# Patient Record
Sex: Female | Born: 1935 | Race: White | Hispanic: No | Smoking: Former smoker
Health system: Southern US, Community
[De-identification: ages and names within clinical notes are randomized; demographics above are authoritative.]

## PROBLEM LIST (undated history)

## (undated) DIAGNOSIS — F329 Major depressive disorder, single episode, unspecified: Secondary | ICD-10-CM

## (undated) DIAGNOSIS — R0600 Dyspnea, unspecified: Secondary | ICD-10-CM

## (undated) DIAGNOSIS — K117 Disturbances of salivary secretion: Secondary | ICD-10-CM

## (undated) DIAGNOSIS — R269 Unspecified abnormalities of gait and mobility: Secondary | ICD-10-CM

## (undated) DIAGNOSIS — E785 Hyperlipidemia, unspecified: Secondary | ICD-10-CM

## (undated) DIAGNOSIS — M545 Low back pain: Secondary | ICD-10-CM

## (undated) DIAGNOSIS — H33001 Unspecified retinal detachment with retinal break, right eye: Secondary | ICD-10-CM

## (undated) DIAGNOSIS — R42 Dizziness and giddiness: Secondary | ICD-10-CM

## (undated) DIAGNOSIS — G250 Essential tremor: Secondary | ICD-10-CM

## (undated) DIAGNOSIS — G2581 Restless legs syndrome: Principal | ICD-10-CM

## (undated) DIAGNOSIS — R413 Other amnesia: Secondary | ICD-10-CM

## (undated) DIAGNOSIS — M810 Age-related osteoporosis without current pathological fracture: Secondary | ICD-10-CM

## (undated) DIAGNOSIS — K219 Gastro-esophageal reflux disease without esophagitis: Secondary | ICD-10-CM

## (undated) DIAGNOSIS — C44721 Squamous cell carcinoma of skin of unspecified lower limb, including hip: Secondary | ICD-10-CM

## (undated) DIAGNOSIS — I2510 Atherosclerotic heart disease of native coronary artery without angina pectoris: Secondary | ICD-10-CM

## (undated) DIAGNOSIS — M1990 Unspecified osteoarthritis, unspecified site: Secondary | ICD-10-CM

## (undated) DIAGNOSIS — Z87442 Personal history of urinary calculi: Secondary | ICD-10-CM

## (undated) DIAGNOSIS — J209 Acute bronchitis, unspecified: Secondary | ICD-10-CM

## (undated) DIAGNOSIS — J438 Other emphysema: Secondary | ICD-10-CM

## (undated) DIAGNOSIS — M2560 Stiffness of unspecified joint, not elsewhere classified: Secondary | ICD-10-CM

## (undated) DIAGNOSIS — I059 Rheumatic mitral valve disease, unspecified: Secondary | ICD-10-CM

## (undated) DIAGNOSIS — J069 Acute upper respiratory infection, unspecified: Secondary | ICD-10-CM

## (undated) DIAGNOSIS — G40909 Epilepsy, unspecified, not intractable, without status epilepticus: Secondary | ICD-10-CM

## (undated) DIAGNOSIS — J449 Chronic obstructive pulmonary disease, unspecified: Secondary | ICD-10-CM

## (undated) DIAGNOSIS — K644 Residual hemorrhoidal skin tags: Secondary | ICD-10-CM

## (undated) DIAGNOSIS — M2550 Pain in unspecified joint: Secondary | ICD-10-CM

## (undated) DIAGNOSIS — G4700 Insomnia, unspecified: Secondary | ICD-10-CM

## (undated) DIAGNOSIS — I10 Essential (primary) hypertension: Secondary | ICD-10-CM

## (undated) DIAGNOSIS — R569 Unspecified convulsions: Secondary | ICD-10-CM

## (undated) DIAGNOSIS — R233 Spontaneous ecchymoses: Secondary | ICD-10-CM

## (undated) HISTORY — DX: Acute upper respiratory infection, unspecified: J06.9

## (undated) HISTORY — DX: Essential tremor: G25.0

## (undated) HISTORY — DX: Age-related osteoporosis without current pathological fracture: M81.0

## (undated) HISTORY — DX: Unspecified convulsions: R56.9

## (undated) HISTORY — DX: Essential (primary) hypertension: I10

## (undated) HISTORY — DX: Rheumatic mitral valve disease, unspecified: I05.9

## (undated) HISTORY — DX: Restless legs syndrome: G25.81

## (undated) HISTORY — DX: Hyperlipidemia, unspecified: E78.5

## (undated) HISTORY — DX: Major depressive disorder, single episode, unspecified: F32.9

## (undated) HISTORY — DX: Stiffness of unspecified joint, not elsewhere classified: M25.60

## (undated) HISTORY — DX: Unspecified abnormalities of gait and mobility: R26.9

## (undated) HISTORY — DX: Pain in unspecified joint: M25.50

## (undated) HISTORY — DX: Spontaneous ecchymoses: R23.3

## (undated) HISTORY — DX: Dizziness and giddiness: R42

## (undated) HISTORY — DX: Insomnia, unspecified: G47.00

## (undated) HISTORY — DX: Low back pain: M54.5

## (undated) HISTORY — PX: RETINAL DETACHMENT SURGERY: SHX105

## (undated) HISTORY — DX: Disturbances of salivary secretion: K11.7

## (undated) HISTORY — DX: Chronic obstructive pulmonary disease, unspecified: J44.9

## (undated) HISTORY — DX: Other emphysema: J43.8

## (undated) HISTORY — PX: EYE SURGERY: SHX253

## (undated) HISTORY — DX: Residual hemorrhoidal skin tags: K64.4

## (undated) HISTORY — DX: Other amnesia: R41.3

## (undated) HISTORY — DX: Acute bronchitis, unspecified: J20.9

## (undated) HISTORY — DX: Unspecified retinal detachment with retinal break, right eye: H33.001

---
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
## 1997-07-21 ENCOUNTER — Other Ambulatory Visit: Admission: RE | Admit: 1997-07-21 | Discharge: 1997-07-21 | Payer: Self-pay | Admitting: Dermatology

## 1997-08-08 ENCOUNTER — Other Ambulatory Visit: Admission: RE | Admit: 1997-08-08 | Discharge: 1997-08-08 | Payer: Self-pay | Admitting: Dermatology

## 1998-11-29 ENCOUNTER — Ambulatory Visit (HOSPITAL_COMMUNITY): Admission: RE | Admit: 1998-11-29 | Discharge: 1998-11-29 | Payer: Self-pay | Admitting: Obstetrics & Gynecology

## 1999-03-29 ENCOUNTER — Other Ambulatory Visit: Admission: RE | Admit: 1999-03-29 | Discharge: 1999-03-29 | Payer: Self-pay | Admitting: *Deleted

## 1999-05-10 ENCOUNTER — Other Ambulatory Visit: Admission: RE | Admit: 1999-05-10 | Discharge: 1999-05-10 | Payer: Self-pay | Admitting: Ophthalmology

## 1999-06-05 ENCOUNTER — Ambulatory Visit (HOSPITAL_BASED_OUTPATIENT_CLINIC_OR_DEPARTMENT_OTHER): Admission: RE | Admit: 1999-06-05 | Discharge: 1999-06-05 | Payer: Self-pay | Admitting: Plastic Surgery

## 2000-04-07 ENCOUNTER — Other Ambulatory Visit: Admission: RE | Admit: 2000-04-07 | Discharge: 2000-04-07 | Payer: Self-pay | Admitting: *Deleted

## 2000-09-03 ENCOUNTER — Encounter (INDEPENDENT_AMBULATORY_CARE_PROVIDER_SITE_OTHER): Payer: Self-pay | Admitting: *Deleted

## 2000-09-03 ENCOUNTER — Ambulatory Visit (HOSPITAL_BASED_OUTPATIENT_CLINIC_OR_DEPARTMENT_OTHER): Admission: RE | Admit: 2000-09-03 | Discharge: 2000-09-03 | Payer: Self-pay | Admitting: Plastic Surgery

## 2001-12-15 ENCOUNTER — Encounter (INDEPENDENT_AMBULATORY_CARE_PROVIDER_SITE_OTHER): Payer: Self-pay

## 2001-12-15 ENCOUNTER — Ambulatory Visit (HOSPITAL_COMMUNITY): Admission: RE | Admit: 2001-12-15 | Discharge: 2001-12-15 | Payer: Self-pay | Admitting: Gastroenterology

## 2002-05-06 ENCOUNTER — Encounter: Payer: Self-pay | Admitting: Ophthalmology

## 2002-05-07 ENCOUNTER — Ambulatory Visit (HOSPITAL_COMMUNITY): Admission: RE | Admit: 2002-05-07 | Discharge: 2002-05-07 | Payer: Self-pay | Admitting: Ophthalmology

## 2002-10-04 ENCOUNTER — Encounter: Payer: Self-pay | Admitting: Neurology

## 2002-10-04 ENCOUNTER — Encounter: Admission: RE | Admit: 2002-10-04 | Discharge: 2002-10-04 | Payer: Self-pay | Admitting: Neurology

## 2003-04-09 HISTORY — PX: CHOLECYSTECTOMY: SHX55

## 2003-04-20 ENCOUNTER — Encounter (INDEPENDENT_AMBULATORY_CARE_PROVIDER_SITE_OTHER): Payer: Self-pay | Admitting: Specialist

## 2003-04-20 ENCOUNTER — Ambulatory Visit (HOSPITAL_COMMUNITY): Admission: RE | Admit: 2003-04-20 | Discharge: 2003-04-20 | Payer: Self-pay | Admitting: Gastroenterology

## 2003-05-12 ENCOUNTER — Other Ambulatory Visit: Admission: RE | Admit: 2003-05-12 | Discharge: 2003-05-12 | Payer: Self-pay | Admitting: *Deleted

## 2003-06-07 ENCOUNTER — Ambulatory Visit: Admission: RE | Admit: 2003-06-07 | Discharge: 2003-06-07 | Payer: Self-pay | Admitting: Gynecology

## 2003-06-21 ENCOUNTER — Inpatient Hospital Stay (HOSPITAL_COMMUNITY): Admission: RE | Admit: 2003-06-21 | Discharge: 2003-06-24 | Payer: Self-pay | Admitting: Obstetrics and Gynecology

## 2003-06-21 ENCOUNTER — Encounter (INDEPENDENT_AMBULATORY_CARE_PROVIDER_SITE_OTHER): Payer: Self-pay | Admitting: Specialist

## 2003-06-21 HISTORY — PX: ABDOMINAL HYSTERECTOMY: SHX81

## 2003-07-20 ENCOUNTER — Ambulatory Visit: Admission: RE | Admit: 2003-07-20 | Discharge: 2003-07-20 | Payer: Self-pay | Admitting: Gynecology

## 2003-08-25 ENCOUNTER — Emergency Department (HOSPITAL_COMMUNITY): Admission: EM | Admit: 2003-08-25 | Discharge: 2003-08-25 | Payer: Self-pay | Admitting: Emergency Medicine

## 2003-09-02 ENCOUNTER — Ambulatory Visit (HOSPITAL_COMMUNITY): Admission: RE | Admit: 2003-09-02 | Discharge: 2003-09-02 | Payer: Self-pay | Admitting: General Surgery

## 2003-09-26 ENCOUNTER — Observation Stay (HOSPITAL_COMMUNITY): Admission: RE | Admit: 2003-09-26 | Discharge: 2003-09-27 | Payer: Self-pay | Admitting: General Surgery

## 2003-09-26 ENCOUNTER — Encounter (INDEPENDENT_AMBULATORY_CARE_PROVIDER_SITE_OTHER): Payer: Self-pay | Admitting: Specialist

## 2004-06-07 ENCOUNTER — Other Ambulatory Visit: Admission: RE | Admit: 2004-06-07 | Discharge: 2004-06-07 | Payer: Self-pay | Admitting: *Deleted

## 2004-09-25 ENCOUNTER — Encounter: Admission: RE | Admit: 2004-09-25 | Discharge: 2004-09-25 | Payer: Self-pay | Admitting: Neurology

## 2005-05-21 ENCOUNTER — Ambulatory Visit (HOSPITAL_COMMUNITY): Admission: RE | Admit: 2005-05-21 | Discharge: 2005-05-21 | Payer: Self-pay | Admitting: Obstetrics & Gynecology

## 2005-11-19 ENCOUNTER — Other Ambulatory Visit: Admission: RE | Admit: 2005-11-19 | Discharge: 2005-11-19 | Payer: Self-pay | Admitting: Obstetrics & Gynecology

## 2005-12-02 ENCOUNTER — Encounter: Admission: RE | Admit: 2005-12-02 | Discharge: 2005-12-02 | Payer: Self-pay | Admitting: Neurology

## 2006-04-08 HISTORY — PX: ELBOW SURGERY: SHX618

## 2006-07-25 ENCOUNTER — Ambulatory Visit (HOSPITAL_COMMUNITY): Admission: RE | Admit: 2006-07-25 | Discharge: 2006-07-26 | Payer: Self-pay | Admitting: Orthopedic Surgery

## 2007-02-06 ENCOUNTER — Encounter: Admission: RE | Admit: 2007-02-06 | Discharge: 2007-03-12 | Payer: Self-pay | Admitting: Neurology

## 2007-04-16 ENCOUNTER — Other Ambulatory Visit: Admission: RE | Admit: 2007-04-16 | Discharge: 2007-04-16 | Payer: Self-pay | Admitting: Obstetrics & Gynecology

## 2007-08-05 ENCOUNTER — Ambulatory Visit (HOSPITAL_COMMUNITY): Admission: RE | Admit: 2007-08-05 | Discharge: 2007-08-05 | Payer: Self-pay | Admitting: Ophthalmology

## 2007-09-30 ENCOUNTER — Ambulatory Visit (HOSPITAL_COMMUNITY): Admission: RE | Admit: 2007-09-30 | Discharge: 2007-09-30 | Payer: Self-pay | Admitting: Ophthalmology

## 2008-12-19 ENCOUNTER — Encounter: Admission: RE | Admit: 2008-12-19 | Discharge: 2008-12-19 | Payer: Self-pay | Admitting: Neurology

## 2009-04-08 DIAGNOSIS — H33001 Unspecified retinal detachment with retinal break, right eye: Secondary | ICD-10-CM

## 2009-04-08 HISTORY — DX: Unspecified retinal detachment with retinal break, right eye: H33.001

## 2009-08-01 ENCOUNTER — Ambulatory Visit: Admission: RE | Admit: 2009-08-01 | Discharge: 2009-09-18 | Payer: Self-pay | Admitting: Radiation Oncology

## 2009-08-16 ENCOUNTER — Emergency Department (HOSPITAL_COMMUNITY): Admission: EM | Admit: 2009-08-16 | Discharge: 2009-08-16 | Payer: Self-pay | Admitting: Emergency Medicine

## 2010-03-05 ENCOUNTER — Encounter: Admission: RE | Admit: 2010-03-05 | Discharge: 2010-03-05 | Payer: Self-pay | Admitting: Internal Medicine

## 2010-04-08 HISTORY — PX: ROTATOR CUFF REPAIR: SHX139

## 2010-06-26 LAB — CBC
HCT: 40.3 % (ref 36.0–46.0)
Hemoglobin: 13.5 g/dL (ref 12.0–15.0)
MCV: 95.2 fL (ref 78.0–100.0)
Platelets: 213 10*3/uL (ref 150–400)
RBC: 4.24 MIL/uL (ref 3.87–5.11)
RDW: 13.2 % (ref 11.5–15.5)
WBC: 9.4 10*3/uL (ref 4.0–10.5)

## 2010-06-26 LAB — BASIC METABOLIC PANEL
BUN: 15 mg/dL (ref 6–23)
GFR calc Af Amer: >60 mL/min (ref >60)
GFR calc non Af Amer: >60 mL/min (ref >60)

## 2010-06-26 LAB — URINE MICROSCOPIC-ADD ON

## 2010-06-26 LAB — URINALYSIS, ROUTINE W REFLEX MICROSCOPIC
Ketones, ur: 15 mg/dL — AB
Protein, ur: NEGATIVE mg/dL
Urobilinogen, UA: 0.2 mg/dL (ref 0.0–1.0)

## 2010-06-26 LAB — DIFFERENTIAL
Basophils Relative: 0 % (ref 0–1)
Eosinophils Absolute: 0.1 10*3/uL (ref 0.0–0.7)
Neutrophils Relative %: 85 % — ABNORMAL HIGH (ref 43–77)

## 2010-08-21 NOTE — Op Note (Signed)
NAME:  [name], [name]                ACCOUNT NO.:  [id number]   MEDICAL RECORD NO.:  [id number]          PATIENT TYPE:  AMB   LOCATION:  SDS                          FACILITY:  [facility]   PHYSICIAN:  Jillyn Hidden A. Rankin, M.D.   DATE OF BIRTH:  [birth year]   DATE OF PROCEDURE:  09/30/2007  DATE OF DISCHARGE:  09/30/2007                               OPERATIVE REPORT   PREOPERATIVE DIAGNOSES:  1. 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  2. Proliferative vitreous retinopathy, stage C2.   POSTOPERATIVE DIAGNOSES:  1. 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  2. Proliferative vitreous retinopathy, stage C2.   PROCEDURE:  1. Posterior vitrectomy with focal laser photocoagulation, right eye,      25 gauge with membrane peel repair of retinal detachment and      tractional detachment.  2. Injection of substitute - C3F8 10%.   SURGEON:  Alford Highland. Rankin, M.D.   ANESTHESIA:  Local with retrobulbar, under monitored anesthesia control.   INDICATION FOR THE PROCEDURE:  The patient is a 77 year old who has an  asymptomatic recurrent localized retinal detachment, rhegmatogenous  combined tractional changes, appears to have an edge-coiled retinal scar  type of break that has redeveloped leading to this retinal detachment,  temporal to the macular region.  This is an attempt to reattach the  retina.  The patient understands the need for surgical intervention and  the risks included from the underlying condition as well as surgical  repair including but not limited to hemorrhage, infection, scarring,  need for further surgery, no change in vision, loss of vision, and  progressive disease despite intervention.   PROCEDURE IN DETAIL:  Appropriate signed consent was obtained. The  patient was taken to the operating room. In the operating room,  appropriate monitors followed by mild sedation.  A 2% Xylocaine and 5  mL  injected retrobulbar with an additional 5 mL laterally and advanced  through modified [employer]. The right periocular region was then  sterilely prepped and draped in the usual sterile fashion.  Lid speculum  was applied.  A 25 gauge trocar was placed into the inferotemporal  quadrant.  Superior trocars were applied.  Core vitrectomy was then  started which has been previously done.  The vitreous surfaces did not  need to be trimmed.  Soon, thereafter, a fluid-air exchange was  completed.  This confirmed the posterior extent of the retinal  detachment extending from the 730 to the 930 position.  Near the  posterior extent of this, a small retinotomy was fashioned with an  active suction with the vitrectomy instrumentation.  Thereafter, a fluid-  air exchange was then completed.  It was necessary during the course of  procedure to coat the posterior surface of the intraocular lens with  Viscoat because of the condensation which appeared.  At this time, the  retina reattached nicely.  Endolaser photocoagulation placed on the core  retinal scars previously present temporally and also posteriorly at the  9 o'clock position where an old peripheral chorio retinal scars noted.  This was treated as well as around the retinotomy after the  reaccumulating subretinal fluid has been aspirated.   No complications occurred.  At this time, an air-C3F8 10% exchange  completely.  Superior sclerotomies were then closed with the trocars  removed, the infusion removed.  Subconjunctival Decadron applied.  Intraocular pressure was found to be adequate.  Sterile patch and Fox  shield was applied.  The patient tolerated the procedure without  complication. She was taken to the short stay and she will be discharge  home and meds as an outpatient.      Alford Highland Rankin, M.D.  Electronically Signed     GAR/MEDQ  D:  09/30/2007  T:  10/01/2007  Job:  [id number]

## 2010-08-21 NOTE — Op Note (Signed)
[name], [name]                ACCOUNT NO.:  [id number]   MEDICAL RECORD NO.:  [id number]          PATIENT TYPE:  AMB   LOCATION:  SDS                          FACILITY:  [facility]   PHYSICIAN:  Martha Highland. Bentley, M.D.   DATE OF BIRTH:  [birth year]   DATE OF PROCEDURE:  08/05/2007  DATE OF DISCHARGE:  08/05/2007                               OPERATIVE REPORT   PREOPERATIVE DIAGNOSES:  1. Rhegmatogenous detachment of the right eye, inferotemporally,      macula-on.  2. Vitreous herniation into the anterior chamber with traction.   POSTOPERATIVE DIAGNOSES:  1. Rhegmatogenous detachment of the right eye, inferotemporally,      macula-on.  2. Vitreous herniation into anterior chamber with traction.   PROCEDURES:  1. Posterior vitrectomy, endolaser panphotocoagulation for retinopexy      purposes - 25 gaze OD.  2. Injection of vitreous substitute - C3F8 8%, OD.   SURGEON:  Martha Highland. Rankin, MD.   ANESTHESIA:  Local retrobulbar, monitored anesthesia control.   INDICATION FOR PROCEDURE:  The patient is a 77 year old woman who has 4  to 5 days of visual field loss and found to have tobacco dust -  pigmentary dispersion in the vitreous cavity and have an inferotemporal  rhegmatogenous detachment - macula-on and pseudophakic.  This is an  attempt to reattach the retina.  The patient understands with the  dehiscence in the posterior capsule that there is a poor chance that the  scleral buckling alone would work.  For this reason, I have elected and  recommended to do perform a  vitrectomy, laser photocoagulation, and  intraocular gas in order to reattach the retina.   The patient understands the risks of anesthesia including recurrence of  death, loss of the eye including but not limited to hemorrhage,  infection, scarring, need for further surgery, no change in vision, loss  of vision, progressive disease despite intervention.  Appropriate signed  consent was obtained.  The patient taken  to the operating room.  In the  operating room, appropriate monitors followed by mild sedation.  A 2%  Xylocaine was injected retrobulbar with additional 5 mL injected  laterally for fascia modified [name].  The right periocular region was  then sterilely prepped and draped in the usual ophthalmic fashion.  Lid  speculum was applied.  A 25-gauge trocar placed in the  inferotemporal  quadrant.  Superior trocar was applied.  Core vitrectomy was then begun.  Vitreous herniation revealed a large capsule dehiscence temporally was  noted and the vitreous in the anterior chamber was removed.  Vitreous  skirt was trimmed 360 degrees.  Vitreous was trimmed off  the bed of  detachment the inferotemporal 2-1/2 clock areas that were involved.   At this time, a retinotomy posteriorly in the detachment was created  very small with the vitrectomy device.  Fluid-fluid exchange was  completed.  Thereafter, fluid-air exchange was completed.  Reaspiration  of fluid within the anterior chamber and posterior chamber was  successful with reattachment of the retina.  Notable finding is that  there was a significant condensation  that recurred on the posterior  surface of the intraocular lens during the procedure and the fluid-air  exchange which required placement of Viscoat to maintain visualization.  This provided great visualization to finish the case.  This time, the  fluid-air exchange was completed and reaspirated subretinal fluid was  carried out.  Thereafter, the endolaser photocoagulation placed at the  bed of detachment as well as around the retinotomy site.   No complications occurred.  Superior trocar was removed after air - C3F8  exchange had been completed.  The superior trocar was removed.  The  anterior trocar was removed.  Subconjunctival Decadron applied.  Slight  opening of conjunctiva was found in the inferotemporal quadrant and this  was closed with interrupted 7-0 Vicryl.  Sterile patch  and Fox shield  applied.  The patient tolerated procedure well without complication.  She was taken to the short-stay area as an outpatient.      [name], M.D.  Electronically Signed     GAR/MEDQ  D:  08/05/2007  T:  08/06/2007  Job:  [id number]

## 2010-08-24 NOTE — Op Note (Signed)
NAMEGRISEL, BLUMENSTOCK                          ACCOUNT NO.:  [id number]   MEDICAL RECORD NO.:  [id number]                   PATIENT TYPE:  INP   LOCATION:  0007                                 FACILITY:  [facility]   PHYSICIAN:  De Blanch, M.D.         DATE OF BIRTH:  1932-12-18   DATE OF PROCEDURE:  06/21/2003  DATE OF DISCHARGE:                                 OPERATIVE REPORT   PREOPERATIVE DIAGNOSIS:  Complex pelvic mass.   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   PROCEDURES:  1. Exploratory laparotomy.  2. Total abdominal hysterectomy.  3. Bilateral salpingo-oophorectomy.  4. Partial omentectomy.  5. Small bowel resection with primary anastomosis.   SURGEON:  Daniel L. Clarke-Pearson, M.D.   ASSISTANT:  1. Laqueta Linden, M.D.  2. Telford Nab, R.N.   ANESTHESIA:  General with oral tracheal tube.   ESTIMATED BLOOD LOSS:  150 mL.   SURGICAL FINDINGS:  At the time of exploratory laparotomy, there was a  complex cystic mass arising from the right ovary measuring approximately 8  cm in diameter.  There were surface excrescences on the ovary.  The left  ovary also had a cystic component measuring approximately 3 cm in diameter  of the uterus, was otherwise normal.  Pelvic peritoneum appeared normal.  There was a multicystic nodular mass on the serosa of the terminal ileum,  measuring approximately 1.5 cm in diameter.  Exploration of the upper  abdomen including the diaphragm, liver, spleen, stomach, omentum, and the  remainder of the small and large bowel was normal.  At the completion of the  surgical procedure, there was no residual disease.  There was no pelvic or  periaortic adenopathy.   DESCRIPTION OF PROCEDURE:  The patient was brought to the operating room and  after satisfactory attainment of general anesthesia, was placed in the  modified lithotomy position in  [place] stirrups.  The anterior abdominal wall,  perineum, and vagina were prepped with Betadine; a Foley catheter was  inserted, and the patient was draped.  The abdomen was entered through a low  midline incision.  Peritoneal washings were obtained.  The abdomen and  pelvis were then explored with the above-noted findings.  Bookwalter  retractor was assembled, and the small bowel was packed out of the pelvis.  The lateral pelvic peritoneum was incised, opening the retroperitoneal  spaces.  The external iliac artery and vein, internal iliac artery and  ureter were identified.  The ovarian vessels were skeletonized, clamped,  cut, free tied, and suture ligated.  The peritoneum of the lateral pelvic  sidewall and broad ligament was at the posterior leaf of the broad ligament  was then incised, thus elevating the large ovarian cyst.  The proximal  fallopian tube and uterine ovarian anastomosis were doubly clamped and  divided.  The right tube and ovary were handed off the operative field  and  submitted to frozen section.  Peritoneum overlying the bladder was incised,  and a bladder flap was advanced with sharp and blunt dissection.  There were  adhesions of the sigmoid colon to the left lateral pelvic sidewall.  These  were lysed.  The left lateral pelvic sidewall peritoneum was opened and the  retroperitoneal spaces again opened, identifying the vessels and ureter.  The ovarian vessels were skeletonized, clamped, cut, free tied, and suture  ligated.  The uterine vessels were then skeletonized, clamped, cut, and  suture ligated in a stepwise fashion.  The paracervical and cardinal  ligaments were clamped, cut, and suture ligated.  Throughout the procedure,  the bladder was advanced with sharp and blunt dissection.  The vaginal  angles were encountered.  They were cross-clamped and the vagina transected  from its connection to the cervix.  Vaginal angles were transfixed with 0  Vicryl, and the  central portion of the vagina closed with interrupted figure-  of-eight sutures of 0 Vicryl.  Hemostasis was ascertained.   Attention was turned to the upper abdomen.  Partial omentectomy was  performed, removing the omentum from the transverse colon.  Vascular  pedicles were clamped and divided and free tied.  In evaluating the small  bowel, a nodule was noted on the terminal ileum.  This was excised, excising  the bowel serosa and some of the muscularis.  The bowel was now entered.  However, it was a large enough area, removal of muscle and serosa that in  order to close this area would have resulted in kinking of the bowel.  It  was therefore felt appropriate to resect the dissection of small bowel.   An incision was made along the connection between the mesentery and the  bowel serosa.  Using the GIA staplers, the bowel was resected.  The  mesentery was cross-clamped and suture ligated.  The small bowel nodule was  submitted separate from the small bowel segments.  The primary side-to-side  anastomosis then performed using GIA stapler and a TA 55 stapler in order to  close the remaining enterotomy.  The mesentery was closed with interrupted  sutures of 2-0 Vicryl.  Pelvis and abdomen were irrigated with saline and  hemostasis ascertained.  The packs and retractors were removed, and the  anterior abdominal wall was closed in layers, the first being a running mass  closure using #1 PDS.  The subcutaneous tissue was irrigated, hemostasis  achieved with cautery, and the skin closed with skin staples.  A dressing  was applied.  The patient was awakened from anesthesia and taken to the  recovery room in satisfactory condition.  Sponge, needle, and instrument  counts correct x 2.                                               De Blanch, M.D.    DC/MEDQ  D:  06/21/2003  T:  06/21/2003  Job:  [id number]  cc:   Laqueta Linden, M.D.  [address]  [place]  [place]  [address]  Fax: [phone]   Telford Nab, R.N.  [address]  [place], [place] [address]

## 2010-08-24 NOTE — Op Note (Signed)
[name], [name]                          ACCOUNT NO.:  [id number]   MEDICAL RECORD NO.:  [id number]                   PATIENT TYPE:  OBV   LOCATION:  0452                                 FACILITY:  [facility]   PHYSICIAN:  Ollen Gross. Vernell Morgans, M.D.              DATE OF BIRTH:  08-May-1932   DATE OF PROCEDURE:  09/26/2003  DATE OF DISCHARGE:  09/27/2003                                 OPERATIVE REPORT   PREOPERATIVE DIAGNOSIS:  Gallstones.   POSTOPERATIVE DIAGNOSIS:  Gallstones.   PROCEDURE:  Laparoscopic cholecystectomy with intraoperative cholangiogram.   SURGEON:  Dr. Carolynne Edouard   ASSISTANT:  Dr. Zachery Dakins   ANESTHESIA:  General endotracheal.   DESCRIPTION OF PROCEDURE:  After informed consent was obtained, the patient  was brought to the operating room and placed in the supine position on the  operating room table.  After adequate induction of general anesthesia, the  patient's abdomen was prepped with Betadine and draped in the usual sterile  manner.  The area below the umbilicus was infiltrated with 0.25% Marcaine.  A small incision was made with a 15 blade knife.  This incision was carried  down through the subcutaneous tissue bluntly with a Kelly clamp and [employer] retractors until the linea alba was identified.  The linea alba was  incised with the 15 blade knife, and each side was grasped with Kocher  clamps and elevated anteriorly.  The preperitoneal space was probed bluntly  with a hemostat until the peritoneum was opened and access was gained to the  abdominal cavity.  A 0 Vicryl pursestring stitch was placed in the fascia  surrounding the opening, and Hasson cannula was placed through the opening  and anchored in place with the previously-placed Vicryl pursestring stitch.  The abdomen was then insufflated with carbon dioxide without difficulty.  A  laparoscope was placed through the Hasson cannula.  The right upper quadrant  was inspected.  The dome of the gallbladder  and liver were readily  identified.  The patient was placed in a head-up position and rotated  slightly with the right side up.  The epigastric region was then infiltrated  with 0.25% Marcaine.  A small incision was made with the 15 blade knife, and  a 10 mm port was placed bluntly through this incision into the abdominal  cavity under direct vision.  Sites were then chosen laterally on the right  side of the abdomen for placement of 5 mm ports.  Each of these areas was  infiltrated with 0.25% Marcaine.  Small stab incisions were made with the 15  blade knife; 5 mm ports were placed bluntly through these incisions into the  abdominal cavity under direct vision.  A blunt grasper was placed through  the lateral-most 5 mm port and used to grasp the dome of the gallbladder and  elevate it anteriorly and superiorly.  Another blunt grasper was placed  through the other 5 mm port and used to retract on the body and neck of the  gallbladder.  A dissector was placed through the epigastric port and using  the electrocautery, the peritoneal reflection at the gallbladder neck was  opened.  Blunt dissection was then carried out in this area until the  gallbladder neck cystic duct junction was readily identified and a good  window was created.  A clip was placed on the gallbladder neck.  A small  ductotomy was made below the clip with the laparoscopic scissors.  A 14  gauge Angiocath was then placed percutaneously through the anterior  abdominal wall under direct vision.  Reddick cholangiogram catheter was then  placed through the Angiocath and flushed.  The Reddick catheter was placed  within the cystic duct and anchored in place with the clip.  A cholangiogram  was obtained that showed no filling defects, good emptying into the  duodenum, and good length on the cystic duct.  The anchoring clip and  catheter were then removed from the patient.  Three clips were placed  proximally on the cystic duct, and  the duct was divided between the two sets  of clips.  Posterior to this, the cystic artery was identified and again  dissected bluntly in a circumferentially manner until a good window was  created.  Two clips were placed proximally and one distally on the artery,  and the artery was divided between the two.  Next, a laparoscopic hook  cautery device was used to separate the gallbladder from the liver bed.  Prior to completely detaching the gallbladder from the liver bed, the liver  bed was inspected, and several small bleeding points were coagulated with  the electrocautery until the liver bed was hemostatic.  The gallbladder was  then detached the rest of the way from the liver bed with the hook  electrocautery without difficulty.  A laparoscopic bag was then placed  within the abdomen through the epigastric port.  The gallbladder was placed  within the bag, and the bag was sealed.  The laparoscope was then moved to  the epigastric port, and the gallbladder grasper was placed through the  Hasson cannula and used to grasp the opening of the bag.  The bag with the  gallbladder was then removed through the infraumbilical port without  difficulty.  The fascial defect was closed with the previously-placed Vicryl  pursestring stitch as well as with another interrupted 0 Vicryl stitch.  The  abdomen was then irrigated with copious amounts of saline until the effluent  was clear.  The liver bed was inspected again and found to be hemostatic.  The rest of the ports were removed under direct vision, and all were  hemostatic.  The gas was allowed to escape.  The skin incisions were all  closed with interrupted 4-0 Monocryl subcuticular stitches.  Benzoin and  Steri-Strips were applied.  The patient tolerated the procedure well.  At  the end of the case, all needle, sponge, and instrument counts were correct. The patient was then awakened and taken to the recovery room in a stable  condition.                                                Ollen Gross. Vernell Morgans, M.D.    PST/MEDQ  D:  09/28/2003  T:  09/28/2003  Job:  [id number]

## 2010-08-24 NOTE — Discharge Summary (Signed)
[name], [name]                          ACCOUNT NO.:  [id number]   MEDICAL RECORD NO.:  [id number]                   PATIENT TYPE:  INP   LOCATION:  0444                                 FACILITY:  [facility]   PHYSICIAN:  Laqueta Linden, M.D.                 DATE OF BIRTH:  1933/01/29   DATE OF ADMISSION:  06/21/2003  DATE OF DISCHARGE:  06/24/2003                                 DISCHARGE SUMMARY   SERVICE:  GYN oncology.   PRINCIPAL DIAGNOSIS ON DISCHARGE:  1. 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   SECONDARY DIAGNOSES:  1. Emphysema.  2. Osteoporosis.  3. Mitral valve prolapse.  4. Seizure disorder.  5. Partial carotid occlusion.  6. History of multiple skin cancers.   PROCEDURES:  1. Total abdominal hysterectomy with bilateral salpingo-oophorectomy.  2. Omentectomy.  3. Small bowel resection with re-anastomosis.   COMPLICATIONS:  None.   CONSULTATIONS:  None.   TRANSFUSIONS:  None.   HISTORY OF PRESENT ILLNESS:  [name] is a 77 year old menopausal female  who presented with some abdominal pain and was noted to have a large  multicystic mass filling the pelvis with excrescences. CA-125 was slightly  elevated at 35. The patient was seen and evaluated by Dr. Katheren Shams-  Sharol Given with surgery recommended. Please see dictated history and physical  for details within.   HOSPITAL COURSE:  The patient was admitted for same day surgery having  undergone mechanical bowel prep prior to admission. She was taken to the  operating room with the above mentioned procedure performed. There was  question of a possible metastasis to the ileum, and therefore resection was  performed. 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 Omentum was negative as was the mesentery  of the small bowel. The right ovary and fallopian tube revealed a serous  borderline tumor, well  differentiated. Other findings included a serous  borderline tumor with a Darnelle Bos tumor on the left ovary, intramural  fibroids, and atrophic endometrium with no cervical disease. Cytologic  washings were negative.   The patient did incredibly well postoperatively. She was tolerating liquid  sips and getting good pain relief from her PCA pump. She continued on her  routine critical medications immediately postoperatively. She remained  afebrile for her postoperative course. She had stable vital signs and  excellent urinary outpatient. On postoperative day one, her hematocrit was  37.4, electrolytes were normal. She gradually advanced her diet, ambulated,  and switched to oral pain medications. On postoperative day #3, she was  tolerating a bland diet, had passed flatus, and had a small bowel movement.  Her abdomen remained soft and nondistended with normal bowel sounds.  Extremities without tenderness. The pathology was reviewed with her. She was  discharged home with staples in place, to be removed by Telford Nab on  June 27, 2003.  She was also given an  appointment for Wednesday, April  13th with Dr. Stanford Breed to review the pathology and any subsequent  management recommendations. She was discharged home in improved condition on  postoperative day #3 with routine written and verbal instructions. She is  given a prescription for Vicodin, dispense #20, one to two q.4-6h. p.r.n.  pain with no refills. She is to continue all her routine medications and to  call for excessive pain, fever, bleeding, symptoms of incision infection,  bladder infection, DVT, PE, bowel or bladder trouble, or any other concerns.                                               Laqueta Linden, M.D.    LKS/MEDQ  D:  07/27/2003  T:  07/27/2003  Job:  [id number]   cc:   De Blanch, M.D.   Telford Nab, R.N.  [address]  [place], [place] [address]   Al Decant. Janey Greaser, MD  [address]  [place]  [place] [address]  Fax: [phone]

## 2010-08-24 NOTE — Op Note (Signed)
[name], [name]                            ACCOUNT NO.:  [id number]   MEDICAL RECORD NO.:  [id number]                   PATIENT TYPE:  OIB   LOCATION:  2899                                 FACILITY:  [facility]   PHYSICIAN:  Guadelupe Sabin, M.D.             DATE OF BIRTH:  22-Feb-1933   DATE OF PROCEDURE:  05/07/2002  DATE OF DISCHARGE:  05/07/2002                                 OPERATIVE REPORT   PREOPERATIVE DIAGNOSIS:  Senile cataract, right eye.   POSTOPERATIVE DIAGNOSIS:  Senile cataract, right eye.   PROCEDURE:  Planned extracapsular cataract extraction, phacoemulsification,  primary insertion of posterior chamber intraocular lens implant.   SURGEON:  Guadelupe Sabin, M.D.   ASSISTANT:  Nurse.   ANESTHESIA:  Local 4% Xylocaine, 0.75% Marcaine.  Anesthesia standby  required in this 77 year old white female.   DESCRIPTION OF PROCEDURE:  After the patient was prepped and draped, the lid  speculum was inserted in the right eye.  The eye was turned downward and a  superior rectus traction suture placed.  Schiotz tonometry was recorded  within safe scale reading units for surgery.  A peritomy was performed  adjacent to the limbus from the 11 to 1 o'clock position.  The corneoscleral  junction was cleaned and a corneoscleral groove made with a 45 degree  Superblade.  The anterior chamber was then entered with the 2.5 mm diamond  keratome at the 12 o'clock position and the 15 degree blade at the 2:30  position.  Using a bent 26-gauge needle on a Healon syringe, a circular  capsulorhexis was begun and then completed with the Grabow forceps.  Hydrodissection and hydrodelineation were performed using 1% Xylocaine.  The  30 degree phacoemulsification tip was then inserted with slow controlled  emulsification of the lens nucleus.  Total ultrasonic time 1 minute 9  seconds.  Average power level 19%.  Total amount of fluid used 80 mL.  Following removal of the nucleus, the  residual cortex was aspirated with the  irrigation-aspiration tip.  The posterior capsule appeared intact with a  brilliant red fundus reflex.  It was therefore elected to insert an [employer] [id number] silicone three-piece posterior chamber intraocular  lens implant.  Diopter strength +21.00.  This was inserted with a McDonald  forceps into the anterior chamber and then centered into the capsular bag  using the [facility] lens rotator.  The lens appeared to be well-centered.  The  Healon which had been used throughout the procedure was aspirated and  replaced with balanced salt solution and Miochol ophthalmic solution.  The  operative incisions appeared to be self-sealing and no sutures were  required.  Maxitrol ointment was instilled in the conjunctival cul-de-sac  and a light patch and protective shield applied.  Duration of procedure 45  minutes.  The patient tolerated the procedure well in general, left the  operating room for the  recovery room in good condition.                                               Guadelupe Sabin, M.D.    HNJ/MEDQ  D:  08/15/2002  T:  08/16/2002  Job:  [id number]

## 2010-08-24 NOTE — H&P (Signed)
[name], [name]                            ACCOUNT NO.:  [id number]   MEDICAL RECORD NO.:  [id number]                   PATIENT TYPE:  OIB   LOCATION:  2899                                 FACILITY:  [facility]   PHYSICIAN:  Guadelupe Sabin, M.D.             DATE OF BIRTH:  November 25, 1932   DATE OF ADMISSION:  05/07/2002  DATE OF DISCHARGE:  05/07/2002                                HISTORY & PHYSICAL   REASON FOR ADMISSION:  This was a planned outpatient surgical admission of  this 77 year old white female admitted for cataract implant surgery of the  right eye.   HISTORY OF PRESENT ILLNESS:  This patient has been followed in my office for  routine eye care since October 1994.  The patient had a corrected acuity of  20/20 right eye, and 20/25 left eye with her hyperopic correction at that  time.  Subsequently, routine ocular examination was carried out with  refractions.  When the patient returned December 23, 2001, it was noted  that her vision had deteriorated and she was noting blurred vision.   On slit lamp examination, nuclear and slight anterior subcapsular cataract  formation was noted in the right and left eye.  The patient continued to be  followed, but when she returned on April 15, 2002, she stated that she was  ready to schedule cataract surgery.  Vision was recorded with best  correction at 20/40 -2 right eye, 20/30 left eye.  Slit lamp examination  confirmed the presence of nuclear cataract formation in both eyes.  The  patient was given oral discussion and printed information concerning the  procedure and its possible complications.  She signed an informed consent  and arrangements were made for her outpatient admission at this time.   PAST MEDICAL HISTORY:  The patient is under the care of Dr. Al Decant.  Foreman.  The patient is said to be in good general health.  She has been  noted by Dr. Al Decant. Janey Greaser to have chronic obstructive pulmonary  disease,  hypercholesterolemia, and history of mitral valve prolapse.   CURRENT MEDICATIONS:  Miraphen, Lexapro, Pravachol, Fosamax, and Valtrex.   ALLERGIES:  She is said to be allergic to VIOXX and DYAZIDE.   REVIEW OF SYMPTOMS:  No current cardiorespiratory complaints.   PHYSICAL EXAMINATION:  VITAL SIGNS:  As recorded on admission, blood  pressure 123/76, temperature 97.7, pulse 87, respirations 16.  GENERAL:  The patient is a pleasant, well-developed, well-nourished 69-year-  old white female in no acute distress.  HEENT:  Ocular exam as noted above.  CHEST:  Lungs clear to auscultation and percussion.  HEART:  Normal sinus rhythm.  No cardiomegaly and no murmurs.  ABDOMEN:  Negative.  EXTREMITIES:  Negative.    ADMISSION DIAGNOSIS:  Nuclear cataract, greater in right than left eye.   PLAN:  Surgical plan cataract implant surgery right eye at this time.  Left  eye later.                                               Guadelupe Sabin, M.D.    HNJ/MEDQ  D:  08/06/2002  T:  08/06/2002  Job:  [id number]   cc:   Al Decant. Janey Greaser, M.D.  [address]  [place]  [place] [address]  Fax: [phone]

## 2010-08-24 NOTE — Op Note (Signed)
NAMEHARLI, [name]                ACCOUNT NO.:  [id number]   MEDICAL RECORD NO.:  [id number]          PATIENT TYPE:  OIB   LOCATION:  1521                         FACILITY:  [facility]   PHYSICIAN:  Madelynn Done, MD  DATE OF BIRTH:  Jun 07, 1932   DATE OF PROCEDURE:  07/25/2006  DATE OF DISCHARGE:                               OPERATIVE REPORT   PREOPERATIVE DIAGNOSIS:  1. Right proximal olecranon fracture, displaced.  2. Osteopenia.  3. Seizure disorder.   POSTOPERATIVE DIAGNOSIS:  1. Right proximal olecranon fracture, displaced.  2. Osteopenia.  3. Seizure disorder.   ATTENDING SURGEON:  Sharma Covert IV, M.D., who was scrubbed and  present for the entire procedure.   ASSISTANT SURGEON:  None.   PROCEDURE:  1. Open treatment of right olecranon fracture, proximal with internal      fixation.  2. Stress radiographs and stress radiograph, right elbow.   IMPLANT USED:  Two 0.0625 K-wires, 20 gauge stainless steel wire for a  tension band construct.   TOURNIQUET TIME:  60 minutes at 225 mmHg.   ANESTHESIA:  General via endotracheal tube.   SURGICAL INDICATIONS:  [name] is a 77 year old right hand dominant  female who fell and landed on her right elbow on July 24, 2006.  The  patient presented to the office with a displaced olecranon fracture.  The patient's treatment options were discussed with her.  It was  recommended that the patient undergo open reduction and internal  fixation.  The risks, benefits and alternatives were discussed in detail  with the patient and signed informed consent was obtained.  The risks  include but are not limited to bleeding, infection, nerve damage,  nonunion, malunion, hardware failure, need for further surgery, loss of  motion of the elbow.   DESCRIPTION OF PROCEDURE:  The patient was identified in the  preoperative holding area a mark with a permanent marker was made on the  right elbow to indicate the correct operative side.  The  patient was  brought back to the operating room and placed supine on the anesthesia  room table.  General anesthesia was administered.  The patient received  preoperative antibiotics prior to any skin incisions.  A well padded  tourniquet was then placed on the right brachium and sealed with a 1000  drape.  Using a bean bag, the patient was then placed in a lazy lateral  position.  All pressure points were well padded.  The right upper  extremity were prepped with Betadine and sterilely draped.  A time out  was then called, the correct site was then identified and the surgery  was then begun.   The limb was then elevated and using Esmarch exsanguination, the  tourniquet was insufflated to 225 mmHg.  An incision was then begun  distally on the subcutaneous border of the ulna and continued proximally  in line with the olecranon area and was curved radially around the tip  of the olecranon.  The incision was then developed down to the fascia  were subcutaneous flaps were then elevated over the tip  of the  olecranon.  The fracture site was then identified and the fracture  hematoma was then evacuated.  Several mm of the periosteum were then  reflected on either side of the fracture line to aid in reduction.  The  fracture sites were then cleaned of clot and debris in order to obtain  good visualization.  There was a small amount of comminution but it was  this was amenable to open reduction and internal fixation with a tension  band construct.   Two 0.0625 K-wires were then used in parallel fashion.  They were then  over inserted about 1.5 and then backed out.  The K-wires were then  placed to engage the anterior cortex of the ulna.  A 2 mm drill hole was  then placed perpendicular to long axis of the ulna about 3 cm distal to  the fracture site.  The drill hole was then placed between the volar and  dorsal surfaces of the ulna.  After placement of the hole, a 20 gauge  wire was then  passed through this drill hole.  The wire was then crossed  over the dorsal surface of the olecranon.  A small loop was then added  to the wire proximal to the dorsal surface on the radial side.  A 14  gauge angiocatheter was placed from ulnar to radial between the triceps  tendon and the tip of the olecranon.  The needle was then removed and  the radial end of the figure-of-eight wire was then inserted into the  Angiocath and pulled around to the other side.  The wire was then  crossed and the two wire knots were then tightened simultaneously to  provide more uniformed tension.  The knots were then cut to about 4 mm,  bent down, and buried in the soft tissue.  The K-wires were then bent  just dorsally past 90 degrees with the metal suction tip and then bent  over 180 degrees and rotated until the short portion of the wire was  then bent anteriorly.  The K-wires were then seated at the mallet and a  nail set and a small portion of the triceps was then opened to allow  seating of the K-wires.   The wound was then thoroughly irrigated with saline solution.  The mini  C-arm was used throughout the entire case to aid in placement of the K-  wires.  Final mini C-arm images were used to obtain good AP and lateral  films which showed a good anatomical reduction of the ulna with good  position of the K-wires engaging the anterior cortex.  The wounds were  then irrigated once again.  Stress radiographs of the elbow were then  taken placing the elbow under live fluoroscopic evaluation, flexion and  extension with no gapping at the fracture site and good stability, there  did not appear to be any penetration of the articular surfaces with the  pins with good motion.  The deep periosteal layer was then closed with 3-  0 Monocryl suture, the subcutaneous tissues closed with 4-0 Monocryl,  and the skin closed with running horizontal mattress 4-0 nylon suture supplemented by several simple nylon  sutures.  The tourniquet was  released prior after placement of the implants and prior to closure.  Hemostasis was obtained with electrocautery.  A sterile compressive  dressing was then applied.  A drain was not used.  The arm was then  placed into a posterior plaster splint.  The  patient was then extubated  and taken to the recovery room in good condition.   POSTOPERATIVE PLAN:  The patient is going to be admitted for pain  control and IV antibiotics for a 23-hour observation.  The wound is  going to be splinted for two weeks to promote soft tissue healing.  We  will repeat a radiographs at the first follow up visit and consider  early active motion.  We will see her back at the two week mark, the  four week mark, and the eight week mark.      Madelynn Done, MD  Electronically Signed     FWO/MEDQ  D:  07/26/2006  T:  07/26/2006  Job:  [id number]

## 2010-08-24 NOTE — Op Note (Signed)
[name], [name]                            ACCOUNT NO.:  [id number]   MEDICAL RECORD NO.:  [id number]                   PATIENT TYPE:  AMB   LOCATION:  ENDO                                 FACILITY:  [facility]   PHYSICIAN:  Petra Kuba, M.D.                 DATE OF BIRTH:  05-08-1932   DATE OF PROCEDURE:  04/19/2002  DATE OF DISCHARGE:                                 OPERATIVE REPORT   PROCEDURE:  Colonoscopy and polypectomy.   INDICATIONS:  Patient with history of significant colon polyps in the past.  Wanted repeat screening to document complete removal and no recurrence.  Consent was signed after risks, benefits, methods, and options were  thoroughly discussed in the office on multiple occasions.   PREMEDICATIONS:  Demerol 60 mg, Versed 7 mg.   DESCRIPTION OF PROCEDURE:  Rectal inspection pertinent for external  hemorrhoids, small.  Digital exam was negative.  Video pediatric adjustable  colonoscope was inserted and fairly easily advanced around the colon to the  cecum.  This did require rolling her on her back and some abdominal  pressure.  No obvious abnormality was seen on insertion.  The cecum was  identified by the appendiceal orifice and the ileocecal valve.  Prep was  adequate.  There was some liquid stool that required washing and suctioning.  On slow withdrawal through the colon, the cecum and the ascending were  normal.  In the mid transverse a small polyp was seen and snared.  Electrocautery applied and the polyp was suctioned through the scope and  collected in the trap.  We withdrew back to about 50 cm.  Did not see any  obvious residual polyp from the medium sized removed a year ago.  We went  ahead and readvanced into the cecum, rolled her on her left side and  readvanced that way.  Again no residual polyp was seen.  Scope was then  slowly withdrawn.  There was a rare early left-sided diverticulum and one  tiny distal sigmoid polyp which was hot biopsied, and  put in a separate  container.  Anorectal pull-through and retroflexing confirmed some small  hemorrhoids.  The scope was reinserted a short ways up the left side of the  colon.  Air was suctioned and the scope removed.  The patient tolerated the  procedure well.  There were no obvious immediate complications.   ENDOSCOPIC DIAGNOSES:  1. Internal and external hemorrhoids.  2. Rare left-sided diverticula.  3. Tiny distal sigmoid polyp hot biopsied.  4. Small mid transverse polyp snared.  5. Otherwise within normal limits to the cecum.   PLAN:  Await pathology.  Probably recheck colon screening in three years  based on previous polyps.  Happy to see back p.r.n; otherwise return care to  Dr. Janey Greaser for the customary health care maintenance to include yearly  rectals and guaiacs.  Petra Kuba, M.D.    MEM/MEDQ  D:  04/20/2003  T:  04/20/2003  Job:  [id number]   cc:   Al Decant. Janey Greaser, MD  [address]  [place]  [place] [address]  Fax: [phone]

## 2010-08-24 NOTE — Op Note (Signed)
[name], [name]                           ACCOUNT NO.:  [id number]   MEDICAL RECORD NO.:  [id number]                   PATIENT TYPE:  AMB   LOCATION:  ENDO                                 [facility]ACILITY:  [facility]   PHYSICIAN:  Petra Kuba, M.D.                 DATE OF BIRTH:  Aug 16, 1932   DATE OF PROCEDURE:  12/15/2001  DATE OF DISCHARGE:                                 OPERATIVE REPORT   PROCEDURE:  Colonoscopy with polypectomy.   ENDOSCOPIST:  Petra Kuba, M.D.   INDICATIONS FOR PROCEDURE:  Patient with multiple GI complaints, due for  colonic screening.   INFORMED CONSENT:  Consent was signed after risks, benefits, methods and  options were thoroughly discussed in the office.   MEDICATIONS USED:  Demerol 80 mg, Versed 7 mg.   DESCRIPTION OF PROCEDURE:  Rectal inspection was pertinent for external  hemorrhoids.  Digital exam was negative.   The video pediatric adjustable colonoscope was inserted and easily advanced  around the colon to the cecum.  No obvious abnormalities were seen on the  insertion.  The cecum was identified by the appendiceal orifice and the  ileocecal valve.  The scope was inserted forwards into the terminal ileum  which was normal.  Photodocumentation was obtained.  The scope was slowly  withdrawn.  Prep was adequate.  There was some liquid stool that required  washing and suctioning.  On slow withdrawal through the colon in the mid  ascending, a small polyp was seen and with snare electrocautery applied the  polyp was suctioned through the scope and collected in the trap.  There was  one tiny polyp on the opposite fold which was hot biopsied.  Those were put  in the first container.  The scope was further withdrawn.  In the mid  transverse, a small to medium size sessile polyp was seen.  The majority of  it was snared.  Electrocautery applied, and the polyp was removed.  We went  ahead and took a few hot biopsies of the edge of the polyp and put it in  a  second container.  There was one other small mid transverse polyp which was  also hot biopsied and put in that container.  The scope was further  withdrawn.  In the distal sigmoid, another tiny polyp was seen and was hot  biopsied, and in the rectum three small polyps were seen.  Two snared, the  other hot biopsied.  All were put in the third container with the distal  sigmoid polyp.  No other polyps were seen.  The scope was retroflexed,  pertinent for some internal hemorrhoids.  The scope was straightened and  readvanced a short ways up the left side of the colon.  Air was suctioned  and the scope removed.  The patient tolerated the procedure well.  There was  no obvious immediate complication.  ENDOSCOPIC DIAGNOSES:  1. Internal and external hemorrhoids.  2. Proximal rectal polyps with two snared and one hot biopsied.  3. Distal sigmoid polyp hot biopsied.  4. Transverse two sessile polyps, one small hot biopsied, the other small to     medium with one snare and three hot biopsies.  5. Two ascending polyps, one snared and one hot biopsied put on container     #1.  6. Otherwise within normal limits to the terminal ileum.    PLAN:  1. Seven to 10 day post polypectomy instructions.  2. Await pathology to determine future colonic screening.  3. Happy to see back p.r.n.  Otherwise return care to Dr. Janey Greaser for the     customary health maintenance to include yearly rectals and guaiacs.                                               Petra Kuba, M.D.    MEM/MEDQ  D:  12/15/2001  T:  12/15/2001  Job:  [id number]   cc:   Al Decant. Janey Greaser, M.D.  [address]  [place]  [place] [address]  Fax: [phone]

## 2010-08-24 NOTE — Consult Note (Signed)
[name], [name]                          ACCOUNT NO.:  [id number]   MEDICAL RECORD NO.:  [id number]                   PATIENT TYPE:  OUT   LOCATION:  GYN                                  FACILITY:  [facility]   PHYSICIAN:  De Blanch, M.D.         DATE OF BIRTH:  [birth year]   DATE OF CONSULTATION:  06/07/2003  DATE OF DISCHARGE:                                   CONSULTATION   CONSULTING PHYSICIAN:  De Blanch, M.D.   REFERRING PHYSICIAN:  Andres Ege, M.D.   REASON FOR CONSULTATION:  [age]-year-old white female seen in  consultation at the request of request of Dr. Katherine Bentley.   The patient was found on routine exam to have an abdominal pelvic mass by  her primary physician, Dr. Janey Greaser.  The patient admits to some gas pains,  but no other significant symptoms.  An ultrasound has shown the mass to be  8.8 x 6.9 x 7.5 cm with three cystic areas containing echogenic wall  excrescences.  The left adnexa shows a smooth cyst with a posterior wall  excrescence.  There is no free fluid.  The uterus shows some very small  intramural fibroids.   PAST MEDICAL HISTORY:  1. Emphysema.  2. Osteoporosis.  3. Mitral valve prolapse.  4. Seizure disorder (the patient had her last seizure in May of 2004).  5. Apparently she has documented carotid occlusion on ultrasound on one     unilaterally, but no surgery has been recommended.  6. Past history of multiple skin cancers.   CURRENT MEDICATIONS:  1. Lamictal.  2. Celebrex.  3. Valtrex.  4. Lexapro.  5. Pravachol.  6. Mirapex.  7. Fosamax.  8. Claritin.  9. Ambien p.r.n.   OBSTETRICAL HISTORY:  Gravida O.   ALLERGIES:  Drug allergies are none.   FAMILY HISTORY:  A mother and sister have breast cancer.  The patient  exercises by walking two miles a day on a treadmill.   PHYSICAL EXAMINATION:  VITAL SIGNS:  Weight is 160 pounds, blood pressure  130/80.  GENERAL:  The patient is a healthy  white female in no acute distress.  HEENT:  Negative.  The neck is supple without thyromegaly.  LYMPHATIC:  There is no supraclavicular or inguinal adenopathy.  EXTREMITIES: The patient has a brace on her right hand because of carpal  tunnel syndrome.  ABDOMEN:  Soft and non-tender.  No masses, organomegaly, ascites or hernias  are noted.  PELVIC EXAM:  EGBUS, vagina, bladder and urethra are normal but atrophic.  The cervix is atrophic.  The uterus is difficult to outline.  There is a  fullness in the right adnexa measuring approximately 6 cm.  Rectovaginal  exam confirms.  There is no cul-de-sac nodularity.   IMPRESSION:  Adnexal mass with bilateral nodularity in the walls.  The  patient has a CA125 value of 35.0.   RECOMMENDATIONS:  I would recommend that  the patient undergo an exploratory  laparotomy, total abdominal hysterectomy, bilateral salpingo-oophorectomy  and intraoperative frozen section.  I had a lengthy discussion with the  patient and her sisters regarding management options should this be benign  or malignant.  If it is a malignancy, then we will proceed with surgical  debulking and surgical staging which might include pelvic and periaortic  lymphadenectomy, omentectomy and multiple perineal biopsies.  The risks of  surgery including hemorrhage, infection, injury to adjacent viscera,  thromboembolic complications and anesthetic risks were discussed in detail  with the patient and her sisters.  All of their questions are answered and  they wish to proceed with surgery as soon as we can coordinate this with Dr.  Katherine Bentley                                               De Blanch, M.D.    DC/MEDQ  D:  06/07/2003  T:  06/07/2003  Job:  [id number]   cc:   Andres Ege, M.D.  [address]  [place]  [place] [address]  Fax: [phone]   Al Decant. Janey Greaser, MD  [address]  [place]  [place] [address]  Fax: [phone]   Telford Nab, R.N.  [phone] [address]  [place], [place] [address]

## 2010-08-24 NOTE — Consult Note (Signed)
[name], CHOU                          ACCOUNT NO.:  [id number]   MEDICAL RECORD NO.:  [id number]                   PATIENT TYPE:  OUT   LOCATION:  GYN                                  FACILITY:  [facility]   PHYSICIAN:  De Blanch, M.D.         DATE OF BIRTH:  04-28-1932   DATE OF CONSULTATION:  07/20/2003  DATE OF DISCHARGE:                                   CONSULTATION   REASON FOR CONSULTATION:  A 77 year old white female returns for  postoperative checkup having undergone TAH/BSO, partial omentectomy, and  small bowel resection with primary anastomosis on June 21, 2003.  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  Omentum and section of the small bowel showed no evidence of  metastatic disease.  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  The patient has had an uncomplicated postoperative course.  She denies any  GI or GU symptoms and has minimal abdominal discomfort at the present time.   PHYSICAL EXAMINATION:  GENERAL:  The patient is a healthy white female in no  acute distress.  HEENT:  Negative.  NECK:  Supple without thyromegaly.  ABDOMEN:  Soft, nontender.  No masses, organomegaly, ascites, or hernias are  noted.  Midline incision is well healed.  PELVIC:  EG/BUS, vagina, bladder, urethra are normal.  Vaginal cuff is  healing well.  The cervix and uterus are surgically absent.  Adnexa without  masses.  Rectovaginal exam confirms.   IMPRESSION:  Good postoperative recovery.  The patient is given the okay to  return to full level of activity.   We discussed the natural history of this stage Ib borderline tumor.  I would  recommend she have exams every 6 months and she will return to the care of  Dr. Cheryle Horsfall for follow-up.                                               De Blanch, M.D.    DC/MEDQ  D:  07/20/2003  T:  07/20/2003  Job:  [id number]   cc:   Cheryle Horsfall,  M.D.   Telford Nab, R.N.  [address]  [place], [place] [address]

## 2010-09-20 ENCOUNTER — Other Ambulatory Visit: Payer: Self-pay | Admitting: Dermatology

## 2010-10-17 ENCOUNTER — Other Ambulatory Visit: Payer: Self-pay | Admitting: Dermatology

## 2010-11-20 HISTORY — PX: SQUAMOUS CELL CARCINOMA EXCISION: SHX2433

## 2010-12-14 ENCOUNTER — Other Ambulatory Visit: Payer: Self-pay | Admitting: Dermatology

## 2011-01-01 LAB — BASIC METABOLIC PANEL
BUN: 19
CO2: 25
Chloride: 108
Glucose, Bld: 98
Potassium: 3.8

## 2011-01-01 LAB — CBC
HCT: 40.2
MCV: 92.9
Platelets: 209
RDW: 13.2

## 2011-01-03 LAB — PROTIME-INR: Prothrombin Time: 12.5

## 2011-01-03 LAB — BASIC METABOLIC PANEL
BUN: 26 — ABNORMAL HIGH
Creatinine, Ser: 0.9
GFR calc non Af Amer: >60
Glucose, Bld: 94

## 2011-01-03 LAB — CBC
MCV: 93.9
Platelets: 224
RDW: 12.6
WBC: 6.3

## 2011-01-03 LAB — APTT: aPTT: 30

## 2011-01-31 DIAGNOSIS — J438 Other emphysema: Secondary | ICD-10-CM

## 2011-01-31 DIAGNOSIS — K117 Disturbances of salivary secretion: Secondary | ICD-10-CM

## 2011-01-31 DIAGNOSIS — M2560 Stiffness of unspecified joint, not elsewhere classified: Secondary | ICD-10-CM

## 2011-01-31 DIAGNOSIS — M810 Age-related osteoporosis without current pathological fracture: Secondary | ICD-10-CM

## 2011-01-31 DIAGNOSIS — R42 Dizziness and giddiness: Secondary | ICD-10-CM

## 2011-01-31 DIAGNOSIS — E785 Hyperlipidemia, unspecified: Secondary | ICD-10-CM

## 2011-01-31 DIAGNOSIS — F329 Major depressive disorder, single episode, unspecified: Secondary | ICD-10-CM

## 2011-01-31 DIAGNOSIS — F323 Major depressive disorder, single episode, severe with psychotic features: Secondary | ICD-10-CM | POA: Insufficient documentation

## 2011-01-31 DIAGNOSIS — M2550 Pain in unspecified joint: Secondary | ICD-10-CM

## 2011-01-31 DIAGNOSIS — I059 Rheumatic mitral valve disease, unspecified: Secondary | ICD-10-CM

## 2011-01-31 DIAGNOSIS — I10 Essential (primary) hypertension: Secondary | ICD-10-CM | POA: Insufficient documentation

## 2011-01-31 DIAGNOSIS — M545 Low back pain, unspecified: Secondary | ICD-10-CM

## 2011-01-31 DIAGNOSIS — R233 Spontaneous ecchymoses: Secondary | ICD-10-CM

## 2011-01-31 DIAGNOSIS — K644 Residual hemorrhoidal skin tags: Secondary | ICD-10-CM

## 2011-01-31 DIAGNOSIS — R569 Unspecified convulsions: Secondary | ICD-10-CM

## 2011-01-31 HISTORY — DX: Spontaneous ecchymoses: R23.3

## 2011-01-31 HISTORY — DX: Low back pain, unspecified: M54.50

## 2011-01-31 HISTORY — DX: Age-related osteoporosis without current pathological fracture: M81.0

## 2011-01-31 HISTORY — DX: Residual hemorrhoidal skin tags: K64.4

## 2011-01-31 HISTORY — DX: Essential (primary) hypertension: I10

## 2011-01-31 HISTORY — DX: Pain in unspecified joint: M25.50

## 2011-01-31 HISTORY — DX: Major depressive disorder, single episode, unspecified: F32.9

## 2011-01-31 HISTORY — DX: Hyperlipidemia, unspecified: E78.5

## 2011-01-31 HISTORY — DX: Rheumatic mitral valve disease, unspecified: I05.9

## 2011-01-31 HISTORY — DX: Other emphysema: J43.8

## 2011-01-31 HISTORY — DX: Stiffness of unspecified joint, not elsewhere classified: M25.60

## 2011-01-31 HISTORY — DX: Unspecified convulsions: R56.9

## 2011-01-31 HISTORY — DX: Disturbances of salivary secretion: K11.7

## 2011-01-31 HISTORY — DX: Dizziness and giddiness: R42

## 2011-03-13 ENCOUNTER — Other Ambulatory Visit: Payer: Self-pay | Admitting: Dermatology

## 2011-05-01 LAB — HM PAP SMEAR

## 2011-05-08 ENCOUNTER — Other Ambulatory Visit: Payer: Self-pay | Admitting: Dermatology

## 2011-05-23 DIAGNOSIS — J449 Chronic obstructive pulmonary disease, unspecified: Secondary | ICD-10-CM

## 2011-05-23 DIAGNOSIS — J4489 Other specified chronic obstructive pulmonary disease: Secondary | ICD-10-CM

## 2011-05-23 DIAGNOSIS — J209 Acute bronchitis, unspecified: Secondary | ICD-10-CM

## 2011-05-23 DIAGNOSIS — J069 Acute upper respiratory infection, unspecified: Secondary | ICD-10-CM

## 2011-05-23 HISTORY — DX: Chronic obstructive pulmonary disease, unspecified: J44.9

## 2011-05-23 HISTORY — DX: Acute upper respiratory infection, unspecified: J06.9

## 2011-05-23 HISTORY — DX: Acute bronchitis, unspecified: J20.9

## 2011-05-23 HISTORY — DX: Other specified chronic obstructive pulmonary disease: J44.89

## 2011-06-06 ENCOUNTER — Other Ambulatory Visit: Payer: Self-pay | Admitting: Dermatology

## 2011-07-08 ENCOUNTER — Ambulatory Visit (INDEPENDENT_AMBULATORY_CARE_PROVIDER_SITE_OTHER): Payer: Medicare Other | Admitting: Family Medicine

## 2011-07-08 VITALS — BP 134/83 | HR 112 | Temp 99.2°F | Resp 18 | Ht 65.25 in | Wt 140.2 lb

## 2011-07-08 DIAGNOSIS — J029 Acute pharyngitis, unspecified: Secondary | ICD-10-CM

## 2011-07-08 DIAGNOSIS — J449 Chronic obstructive pulmonary disease, unspecified: Secondary | ICD-10-CM

## 2011-07-08 DIAGNOSIS — J40 Bronchitis, not specified as acute or chronic: Secondary | ICD-10-CM

## 2011-07-08 DIAGNOSIS — J209 Acute bronchitis, unspecified: Secondary | ICD-10-CM

## 2011-07-08 NOTE — Progress Notes (Signed)
78 yo woman with COPD with 4 days of worsening cough and sore throat associated with low grade fever.  Cough is productive, colored yellow more now.  No chest pain, some dyspnea.  Cannot move balls on inspirometer now.  Uses spiriva.  O:  Alert and no distress Chest:  Increased AP diameter, diffuse wet rhonchi bilaterally, no acute distress at rest with respect respirations  HEENT: Unremarkable with the exception of very red uvula and adjacent soft palate.  Heart: regular without murmur  Extremities no edema skin color: Pink  Assessment COPD with acute bronchitis and pharyngitis  Plan: Avelox and dux mouthwash

## 2011-07-08 NOTE — Patient Instructions (Signed)

## 2011-07-09 ENCOUNTER — Telehealth: Payer: Self-pay

## 2011-07-09 NOTE — Telephone Encounter (Signed)
Pt was in office today and would like a return call from nurse or Dr she has questions about medications she is currently taking

## 2011-07-10 NOTE — Telephone Encounter (Signed)
There are no interactions between these medications and her Kepra and Lamictal.

## 2011-07-10 NOTE — Telephone Encounter (Signed)
Pt was unsure of what meds she was on yesterday when she was here, and she called back to let us know about these two meds. She wants to make sure there is no contraindication with the avelox that she was prescribed. Please advise:  Keppra 500mg --1/2 tablet tid lamcital 150--2 whole pills during the day, and half a pill at lunch

## 2011-07-10 NOTE — Telephone Encounter (Signed)
Spoke with pt and informed of no interaction between meds.

## 2011-09-12 DIAGNOSIS — G2581 Restless legs syndrome: Secondary | ICD-10-CM

## 2011-09-12 DIAGNOSIS — G4700 Insomnia, unspecified: Secondary | ICD-10-CM

## 2011-09-12 HISTORY — DX: Restless legs syndrome: G25.81

## 2011-09-12 HISTORY — DX: Insomnia, unspecified: G47.00

## 2012-02-28 ENCOUNTER — Other Ambulatory Visit: Payer: Self-pay | Admitting: Dermatology

## 2012-04-23 ENCOUNTER — Other Ambulatory Visit: Payer: Self-pay | Admitting: Dermatology

## 2012-06-04 ENCOUNTER — Other Ambulatory Visit: Payer: Self-pay | Admitting: Dermatology

## 2012-06-26 ENCOUNTER — Other Ambulatory Visit: Payer: Self-pay | Admitting: Dermatology

## 2012-07-07 ENCOUNTER — Other Ambulatory Visit: Payer: Self-pay | Admitting: Dermatology

## 2012-07-31 ENCOUNTER — Other Ambulatory Visit: Payer: Self-pay | Admitting: Dermatology

## 2012-09-10 ENCOUNTER — Non-Acute Institutional Stay: Payer: 59 | Admitting: Nurse Practitioner

## 2012-09-10 VITALS — BP 112/76 | HR 88 | Temp 97.8°F | Ht 65.25 in | Wt 155.0 lb

## 2012-09-10 DIAGNOSIS — G4700 Insomnia, unspecified: Secondary | ICD-10-CM

## 2012-09-10 DIAGNOSIS — J449 Chronic obstructive pulmonary disease, unspecified: Secondary | ICD-10-CM

## 2012-09-10 DIAGNOSIS — H00013 Hordeolum externum right eye, unspecified eyelid: Secondary | ICD-10-CM

## 2012-09-10 DIAGNOSIS — H00019 Hordeolum externum unspecified eye, unspecified eyelid: Secondary | ICD-10-CM | POA: Insufficient documentation

## 2012-09-10 DIAGNOSIS — I10 Essential (primary) hypertension: Secondary | ICD-10-CM

## 2012-09-10 DIAGNOSIS — R609 Edema, unspecified: Secondary | ICD-10-CM

## 2012-09-10 DIAGNOSIS — R569 Unspecified convulsions: Secondary | ICD-10-CM

## 2012-09-10 DIAGNOSIS — M810 Age-related osteoporosis without current pathological fracture: Secondary | ICD-10-CM

## 2012-09-10 DIAGNOSIS — F329 Major depressive disorder, single episode, unspecified: Secondary | ICD-10-CM

## 2012-09-10 DIAGNOSIS — J4489 Other specified chronic obstructive pulmonary disease: Secondary | ICD-10-CM

## 2012-09-10 DIAGNOSIS — G2581 Restless legs syndrome: Secondary | ICD-10-CM

## 2012-09-10 NOTE — Assessment & Plan Note (Signed)
This occurred when she was weaned off her phenobarbital for seizures. Ambien 5mg  has been effective.

## 2012-09-10 NOTE — Assessment & Plan Note (Signed)
Multiple fxs in the past, now concerns her wrists may fxs when she turns herself in bed--hx of osteoporosis and has never been treated. Will obtain bone density and may consider Alendronate if indicated.

## 2012-09-10 NOTE — Progress Notes (Signed)
Patient ID: [name], female   DOB: Aug 03, 1932, 79 y.o.   MRN: [id number]   Allergies  Allergen Reactions  . Tetracyclines & Related     Chief Complaint  Patient presents with  . Foot Swelling    right for 1 1/2 months, daily when she has shoes one  . Eye Drainage    left eye for 2 days, swelling, some pink, itching    HPI: Patient is a 79 y.o. female seen in the clinic at [facility] today for RLE edema, concern of osteoporosis, right eyelid abnormality  Problem List Items Addressed This Visit   Senile osteoporosis (Chronic)     Multiple fxs in the past, now concerns her wrists may fxs when she turns herself in bed--hx of osteoporosis and has never been treated. Will obtain bone density and may consider Alendronate if indicated.     Restless legs syndrome (RLS) - Primary     Onset was after she is off Phenobarbital--currently controlled on Mirapex     Insomnia, unspecified     This occurred when she was weaned off her phenobarbital for seizures. Ambien 5mg  has been effective.     Chronic airway obstruction, not elsewhere classified     Stable on Spiriva,     Relevant Medications      SPIRIVA HANDIHALER 18 MCG inhalation capsule   Unspecified essential hypertension   Other convulsions     Last seizure activities was about 14 months ago, Levetiracetam was added in addition to Lamictal.     Major depressive disorder, single episode, unspecified     Stable on Lexapro 10mg      Edema     In her RLE--1+--has middle shin skin cancer removal surgery with MOHS procedure done--suture closure will be removed tomorrow--RLE is mildly edematous, slightly warmer, no cord like mass palpated, no tenderness. Observe, it should improve after sutures removed.     Stye     Lateral of the right upper lid--warm compress should help.        Review of Systems:  Review of Systems  Constitutional: Negative for fever, chills, weight loss, malaise/fatigue and diaphoresis.  HENT:  Positive for hearing loss. Negative for ear pain, nosebleeds, sore throat, neck pain and ear discharge.   Eyes: Positive for pain (lateral upper right eyelid. ). Negative for blurred vision, double vision, photophobia, discharge and redness.  Respiratory: Positive for shortness of breath and wheezing. Negative for cough, sputum production and stridor.   Cardiovascular: Positive for leg swelling (RLE) and PND. Negative for chest pain, palpitations, orthopnea and claudication.  Gastrointestinal: Negative for heartburn, nausea, vomiting, abdominal pain, diarrhea, constipation and blood in stool.  Genitourinary: Negative for dysuria, urgency, frequency, hematuria and flank pain.  Musculoskeletal: Negative for myalgias, back pain, joint pain and falls.  Skin: Negative for itching and rash.       Right middle shin MOHS surgery for skin cancer with suture closure  Neurological: Positive for seizures. Negative for dizziness, tingling, tremors, sensory change, speech change, focal weakness, loss of consciousness, weakness and headaches.  Endo/Heme/Allergies: Negative for environmental allergies and polydipsia. Does not bruise/bleed easily.  Psychiatric/Behavioral: Positive for depression. Negative for hallucinations. The patient has insomnia. The patient is not nervous/anxious.      Past Medical History  Diagnosis Date  . Restless legs syndrome (RLS) 09/12/2011  . Insomnia, unspecified 09/12/2011  . Acute upper respiratory infections of unspecified site 05/23/2011  . Acute bronchitis 05/23/2011  . Chronic airway obstruction, not elsewhere classified  05/23/2011  . Other and unspecified hyperlipidemia 01/31/2011  . Major depressive disorder, single episode, unspecified 01/31/2011  . Unspecified essential hypertension 01/31/2011  . Mitral valve disorders 01/31/2011  . External hemorrhoids without mention of complication 01/31/2011  . Other emphysema 01/31/2011  . Disturbance of salivary secretion 01/31/2011   . Pain in joint, site unspecified 01/31/2011  . Stiffness of joints, not elsewhere classified, multiple sites 01/31/2011  . Lumbago 01/31/2011  . Senile osteoporosis 01/31/2011  . Other convulsions 01/31/2011  . Dizziness and giddiness 01/31/2011  . Spontaneous ecchymoses 01/31/2011  . Retinal detachment with retinal defect of right eye 2011    right eye twice   Past Surgical History  Procedure Laterality Date  . Abdominal hysterectomy  2008    total   Social History:   reports that she quit smoking about 25 years ago. She has never used smokeless tobacco. She reports that she does not drink alcohol or use illicit drugs.  History reviewed. No pertinent family history.  Medications: Patient's Medications  New Prescriptions   No medications on file  Previous Medications   ESCITALOPRAM (LEXAPRO) 10 MG TABLET    Take 5 mg by mouth daily.    LAMOTRIGINE (LAMICTAL) 100 MG TABLET    Take 100 mg by mouth. Take one tablet in morning, 1/2 tablet at noon, one tablet in evening   LEVETIRACETAM (KEPPRA) 500 MG TABLET    Take 250 mg by mouth 3 (three) times daily.   PRAMIPEXOLE (MIRAPEX) 0.125 MG TABLET    Take 0.125 mg by mouth 3 (three) times daily.   SIMVASTATIN (ZOCOR) 10 MG TABLET    Take 10 mg by mouth at bedtime.   SPIRIVA HANDIHALER 18 MCG INHALATION CAPSULE    Take one daily   VITAMIN D, ERGOCALCIFEROL, (DRISDOL) 50000 UNITS CAPS    Take 50,000 Units by mouth.  Modified Medications   No medications on file  Discontinued Medications   LORATADINE (CLARITIN) 10 MG TABLET    Take 10 mg by mouth daily.   ZOLPIDEM (AMBIEN) 5 MG TABLET    Take 5 mg by mouth at bedtime as needed.     Physical Exam: Physical Exam  Constitutional: She is oriented to person, place, and time. She appears well-developed and well-nourished. No distress.  HENT:  Head: Normocephalic and atraumatic.  Right Ear: External ear normal.  Left Ear: External ear normal.  Nose: Nose normal.  Mouth/Throat:  Oropharynx is clear and moist. No oropharyngeal exudate.  Eyes: Conjunctivae and EOM are normal. Pupils are equal, round, and reactive to light. Right eye exhibits no discharge. Left eye exhibits no discharge. No scleral icterus.  Stype at the lateral of the right upper eyelid  Neck: Normal range of motion. Neck supple. No JVD present. No tracheal deviation present. No thyromegaly present.  Cardiovascular: Normal rate, regular rhythm and normal heart sounds.   No murmur heard. Pulmonary/Chest: Effort normal and breath sounds normal. No respiratory distress. She has no wheezes. She has no rales. She exhibits no tenderness.  Abdominal: Soft. Bowel sounds are normal. She exhibits no distension. There is no tenderness. There is no rebound.  Musculoskeletal: Normal range of motion. She exhibits edema (RLE). She exhibits no tenderness.  Lymphadenopathy:    She has no cervical adenopathy.  Neurological: She is alert and oriented to person, place, and time. She has normal reflexes. She displays normal reflexes. No cranial nerve deficit. She exhibits normal muscle tone. Coordination normal.  Skin: Skin is warm and dry. No rash  noted. She is not diaphoretic. There is erythema (right lower leg peri-surgical incision. ).  Psychiatric: She has a normal mood and affect. Her behavior is normal. Judgment and thought content normal.    Filed Vitals:   09/10/12 1513  BP: 112/76  Pulse: 88  Temp: 97.8 F (36.6 C)  TempSrc: Oral  Height: 5' 5.25" (1.657 m)  Weight: 155 lb (70.308 kg)          Assessment/Plan Chronic airway obstruction, not elsewhere classified Stable on Spiriva,   Senile osteoporosis Multiple fxs in the past, now concerns her wrists may fxs when she turns herself in bed--hx of osteoporosis and has never been treated. Will obtain bone density and may consider Alendronate if indicated.   Insomnia, unspecified This occurred when she was weaned off her phenobarbital for seizures.  Ambien 5mg  has been effective.   Other convulsions Last seizure activities was about 14 months ago, Levetiracetam was added in addition to Lamictal.   Restless legs syndrome (RLS) Onset was after she is off Phenobarbital--currently controlled on Mirapex   Major depressive disorder, single episode, unspecified Stable on Lexapro 10mg    Edema In her RLE--1+--has middle shin skin cancer removal surgery with MOHS procedure done--suture closure will be removed tomorrow--RLE is mildly edematous, slightly warmer, no cord like mass palpated, no tenderness. Observe, it should improve after sutures removed.   Stye Lateral of the right upper lid--warm compress should help.     Family/ Staff Communication: observe  Goals of Care:[place]  Labs/tests ordered: Bone Density

## 2012-09-10 NOTE — Assessment & Plan Note (Signed)
Onset was after she is off Phenobarbital--currently controlled on Mirapex    

## 2012-09-10 NOTE — Assessment & Plan Note (Signed)
Stable on Spiriva,   

## 2012-09-10 NOTE — Assessment & Plan Note (Signed)
Stable on Lexapro 10mg  

## 2012-09-10 NOTE — Assessment & Plan Note (Signed)
In her RLE--1+--has middle shin skin cancer removal surgery with MOHS procedure done--suture closure will be removed tomorrow--RLE is mildly edematous, slightly warmer, no cord like mass palpated, no tenderness. Observe, it should improve after sutures removed.

## 2012-09-10 NOTE — Assessment & Plan Note (Signed)
Lateral of the right upper lid--warm compress should help.

## 2012-09-10 NOTE — Assessment & Plan Note (Signed)
Last seizure activities was about 14 months ago, Levetiracetam was added in addition to Lamictal.

## 2012-09-28 ENCOUNTER — Other Ambulatory Visit: Payer: Self-pay | Admitting: Neurology

## 2012-10-08 ENCOUNTER — Encounter: Payer: Self-pay | Admitting: Nurse Practitioner

## 2012-10-15 ENCOUNTER — Encounter: Payer: Self-pay | Admitting: Internal Medicine

## 2012-10-15 ENCOUNTER — Non-Acute Institutional Stay: Payer: 59 | Admitting: Internal Medicine

## 2012-10-15 VITALS — BP 122/82 | HR 92 | Ht 65.25 in | Wt 155.0 lb

## 2012-10-15 DIAGNOSIS — C4492 Squamous cell carcinoma of skin, unspecified: Secondary | ICD-10-CM

## 2012-10-15 DIAGNOSIS — E559 Vitamin D deficiency, unspecified: Secondary | ICD-10-CM | POA: Insufficient documentation

## 2012-10-15 DIAGNOSIS — R609 Edema, unspecified: Secondary | ICD-10-CM

## 2012-10-15 DIAGNOSIS — G40909 Epilepsy, unspecified, not intractable, without status epilepticus: Secondary | ICD-10-CM

## 2012-10-15 DIAGNOSIS — G4700 Insomnia, unspecified: Secondary | ICD-10-CM

## 2012-10-15 DIAGNOSIS — F329 Major depressive disorder, single episode, unspecified: Secondary | ICD-10-CM

## 2012-10-15 DIAGNOSIS — M810 Age-related osteoporosis without current pathological fracture: Secondary | ICD-10-CM

## 2012-10-15 DIAGNOSIS — Z79899 Other long term (current) drug therapy: Secondary | ICD-10-CM

## 2012-10-15 DIAGNOSIS — J449 Chronic obstructive pulmonary disease, unspecified: Secondary | ICD-10-CM

## 2012-10-15 DIAGNOSIS — H00019 Hordeolum externum unspecified eye, unspecified eyelid: Secondary | ICD-10-CM

## 2012-10-15 DIAGNOSIS — E785 Hyperlipidemia, unspecified: Secondary | ICD-10-CM

## 2012-10-15 DIAGNOSIS — I10 Essential (primary) hypertension: Secondary | ICD-10-CM

## 2012-10-15 DIAGNOSIS — H00016 Hordeolum externum left eye, unspecified eyelid: Secondary | ICD-10-CM

## 2012-10-15 DIAGNOSIS — G2581 Restless legs syndrome: Secondary | ICD-10-CM

## 2012-10-15 MED ORDER — ESCITALOPRAM OXALATE 10 MG PO TABS: ORAL_TABLET | ORAL | Status: DC

## 2012-10-15 MED ORDER — VITAMIN D 1000 UNITS PO TABS: ORAL_TABLET | ORAL | Status: DC

## 2012-10-15 NOTE — Patient Instructions (Signed)
Continue current medications. 

## 2012-10-15 NOTE — Progress Notes (Signed)
Subjective:    Patient ID: [name], female    DOB: Nov 10, 1932, 79 y.o.   MRN: [id number]  HPI  Senile osteoporosis: Osteoporosis on bone density 09/23/12.  Restless legs syndrome (RLS): controlled on current medication  Chronic airway obstruction, not elsewhere classified: stable  Edema; unchanged  Seizure disorder: History of last seizure 2 1/2 years ago. Seizures started the late 1970's.   Insomnia, unspecified: unchanged  Unspecified essential hypertension: controlled  Major depressive disorder, single episode, unspecified: stable on current medication ( escitalopram (LEXAPRO) 10 MG tablet)  Stye, left; resolved.  Unspecified vitamin D deficiency: currently supplementing with (cholecalciferol (VITAMIN D) 1000 UNITS tablet, Vitamin D, 1,25-dihydroxy)  Other and unspecified hyperlipidemia: needs reck of Lipid panel     Current Outpatient Prescriptions on File Prior to Visit  Medication Sig Dispense Refill  . escitalopram (LEXAPRO) 10 MG tablet Take 5 mg by mouth daily.       [name] lamoTRIgine (LAMICTAL) 100 MG tablet Take 100 mg by mouth. Take one tablet in morning, 1/2 tablet at noon, one tablet in evening      . levETIRAcetam (KEPPRA) 500 MG tablet Take 250 mg by mouth 3 (three) times daily.      . pramipexole (MIRAPEX) 0.25 MG tablet TAKE 1 TABLET ONCE DAILY.  90 tablet  2  . simvastatin (ZOCOR) 10 MG tablet Take 10 mg by mouth at bedtime.      [name] SPIRIVA HANDIHALER 18 MCG inhalation capsule Take one daily      . Vitamin D, Ergocalciferol, (DRISDOL) 50000 UNITS CAPS Take 50,000 Units by mouth.           Review of Systems  Constitutional: Negative for fever, chills, weight loss, malaise/fatigue and diaphoresis.  HENT: Positive for hearing loss. Negative for ear pain, nosebleeds, sore throat, neck pain and ear discharge.   Eyes: Negative for blurred vision, double vision, photophobia, discharge and redness.  Respiratory: Positive for shortness of breath and  wheezing. Negative for cough, sputum production and stridor.   Cardiovascular: Positive for leg swelling (RLE) and PND. Negative for chest pain, palpitations, orthopnea and claudication.  Gastrointestinal: Negative for heartburn, nausea, vomiting, abdominal pain, diarrhea, constipation and blood in stool.  Genitourinary: Negative for dysuria, urgency, frequency, hematuria and flank pain.  Musculoskeletal: Negative for myalgias, back pain, joint pain and falls.  Skin: Negative for itching and rash.        Right middle shin MOHS surgery for skin cancer with suture closure  Neurological: Positive for seizures. Negative for dizziness, tingling, tremors, sensory change, speech change, focal weakness, loss of consciousness, weakness and headaches.  Endo/Heme/Allergies: Negative for environmental allergies and polydipsia. Does not bruise/bleed easily.  Psychiatric/Behavioral: Positive for depression. Negative for hallucinations. The patient has insomnia. The patient is not nervous/anxious      Objective:   Physical Exam  Constitutional: She is oriented to person, place, and time. She appears well-developed and well-nourished. No distress.  HENT:   Head: Normocephalic and atraumatic.  Right Ear: External ear normal.  Left Ear: External ear normal.   Nose: Nose normal.   Mouth/Throat: Oropharynx is clear and moist. No oropharyngeal exudate.  Eyes: Conjunctivae and EOM are normal. Pupils are equal, round, and reactive to light. Right eye exhibits no discharge. Left eye exhibits no discharge. No scleral icterus.  Neck: Normal range of motion. Neck supple. No JVD present. No tracheal deviation present. No thyromegaly present.  Cardiovascular: Normal rate, regular rhythm and normal heart sounds.    No murmur  heard. Pulmonary/Chest: Effort normal and breath sounds normal. No respiratory distress. She has no wheezes. She has no rales. She exhibits no tenderness.  Abdominal: Soft. Bowel sounds are  normal. She exhibits no distension. There is no tenderness. There is no rebound.  Musculoskeletal: Normal range of motion. She exhibits edema (RLE). She exhibits no tenderness.  Lymphadenopathy:    She has no cervical adenopathy.  Neurological: She is alert and oriented to person, place, and time. She has normal reflexes. She displays normal reflexes. No cranial nerve deficit. She exhibits normal muscle tone. Coordination normal.  Skin: Skin is warm and dry. No rash noted. She is not diaphoretic.  Psychiatric: She has a normal mood and affect. Her behavior is normal. Judgment and thought content normal      Assessment & Plan:  Senile osteoporosis: discussed Prolia. She is agreeable to using. Will get info about cost to her.  Restless legs syndrome (RLS): stable on current meds  Chronic airway obstruction, not elsewhere classified: unchanged  Edema: mild  Seizure disorder: none since 2011  Insomnia, unspecified: unchanged  Unspecified essential hypertension:controlled  Major depressive disorder, single episode, unspecified: stable   - Plan: escitalopram (LEXAPRO) 10 MG tablet  Unspecified vitamin D deficiency -   Plan: cholecalciferol (VITAMIN D) 1000 UNITS tablet, Vitamin D, 1,25-dihydroxy  Encounter for long-term (current) use of other medications - Plan: Comprehensive metabolic panel  Other and unspecified hyperlipidemia   - Plan: Lipid panel

## 2012-10-28 ENCOUNTER — Other Ambulatory Visit: Payer: Self-pay | Admitting: Dermatology

## 2012-11-04 ENCOUNTER — Encounter: Payer: Self-pay | Admitting: Internal Medicine

## 2012-11-06 ENCOUNTER — Telehealth: Payer: Self-pay | Admitting: Neurology

## 2012-11-06 NOTE — Telephone Encounter (Signed)
The patient needs to be assigned so that she can be scheduled.

## 2012-11-06 NOTE — Telephone Encounter (Signed)
LMVM for pt that can accomodate her request for Dr. Anne Hahn.  Will be sent to scheduler to set up appt. First available if routine visit.  To call back if problem.

## 2012-11-09 ENCOUNTER — Telehealth: Payer: Self-pay | Admitting: Neurology

## 2012-11-09 DIAGNOSIS — R269 Unspecified abnormalities of gait and mobility: Secondary | ICD-10-CM

## 2012-11-09 DIAGNOSIS — F05 Delirium due to known physiological condition: Secondary | ICD-10-CM

## 2012-11-09 NOTE — Telephone Encounter (Signed)
Prior Dr. Sandria Manly pt. Dr. Anne Hahn already has 2 prior Dr. Sandria Manly pt's that day of schedule.

## 2012-11-09 NOTE — Telephone Encounter (Signed)
I called patient. The patient indicates that 3 weeks ago, she had an event of slight confusion while eating, not knowing which can be used to pick up a fork. After the event, the patient had some balance issues that persisted for 2 weeks, but had improved. The patient is concerned that this may have represented a mall vessel infarct. I would agree with her, and I'll get an MRI evaluation of the brain, a carotid Doppler study, and a revisit. The patient is on low-dose aspirin.

## 2012-11-11 ENCOUNTER — Other Ambulatory Visit: Payer: Self-pay | Admitting: Internal Medicine

## 2012-11-12 ENCOUNTER — Other Ambulatory Visit: Payer: Self-pay | Admitting: Geriatric Medicine

## 2012-11-12 MED ORDER — TIOTROPIUM BROMIDE MONOHYDRATE 18 MCG IN CAPS: ORAL_CAPSULE | RESPIRATORY_TRACT | Status: DC

## 2012-11-16 ENCOUNTER — Encounter: Payer: Self-pay | Admitting: *Deleted

## 2012-11-19 ENCOUNTER — Ambulatory Visit (INDEPENDENT_AMBULATORY_CARE_PROVIDER_SITE_OTHER): Payer: 59

## 2012-11-19 DIAGNOSIS — F05 Delirium due to known physiological condition: Secondary | ICD-10-CM

## 2012-11-19 DIAGNOSIS — R269 Unspecified abnormalities of gait and mobility: Secondary | ICD-10-CM

## 2012-11-20 ENCOUNTER — Encounter: Payer: Self-pay | Admitting: Neurology

## 2012-11-20 ENCOUNTER — Ambulatory Visit (INDEPENDENT_AMBULATORY_CARE_PROVIDER_SITE_OTHER): Payer: 59 | Admitting: Neurology

## 2012-11-20 VITALS — BP 146/80 | HR 88 | Wt 151.0 lb

## 2012-11-20 DIAGNOSIS — G40909 Epilepsy, unspecified, not intractable, without status epilepticus: Secondary | ICD-10-CM

## 2012-11-20 NOTE — Progress Notes (Signed)
Reason for visit: Seizures  [name] is an 79 y.o. female  History of present illness:  [name] is a 79 year old right-handed white female with a history of seizures, currently on Lamictal. The last seizure was in 2011. The patient returns to this office for an evaluation of an event that occurred in the first week of July 2014. The patient had been eating a meal, and suddenly she seemed to be confused, unable to use a fork and knife. For about 2 weeks after this event, the patient had a significant worsening of balance. The patient has since resolved with her deficits, and she feels that she is back to normal. The patient does have mild chronic gait disorder, but she denies any recent falls. The patient denied any focal numbness or weakness of the face, arms, or legs. The patient had no speech changes or headache. The patient has undergone a carotid Doppler study that was unremarkable. The patient is on low-dose aspirin, and she was on aspirin at the time of the event described above. The patient has been set up for a MRI of the brain, but this has not yet been done. The patient has mild small vessel disease on a prior MRI done in 2010. The patient indicates that she did not miss any doses of her Lamictal around the time of the above event.  Past Medical History  Diagnosis Date  . Restless legs syndrome (RLS) 09/12/2011  . Insomnia, unspecified 09/12/2011  . Acute upper respiratory infections of unspecified site 05/23/2011  . Acute bronchitis 05/23/2011  . Chronic airway obstruction, not elsewhere classified 05/23/2011  . Other and unspecified hyperlipidemia 01/31/2011  . Major depressive disorder, single episode, unspecified 01/31/2011  . Unspecified essential hypertension 01/31/2011  . Mitral valve disorders 01/31/2011  . External hemorrhoids without mention of complication 01/31/2011  . Other emphysema 01/31/2011  . Disturbance of salivary secretion 01/31/2011  . Pain in joint, site  unspecified 01/31/2011  . Stiffness of joints, not elsewhere classified, multiple sites 01/31/2011  . Lumbago 01/31/2011  . Senile osteoporosis 01/31/2011  . Other convulsions 01/31/2011  . Dizziness and giddiness 01/31/2011  . Spontaneous ecchymoses 01/31/2011  . Retinal detachment with retinal defect of right eye 2011    right eye twice    Past Surgical History  Procedure Laterality Date  . Tonsillectomy    . Abdominal hysterectomy  2000    total  . Cholecystectomy  2001  . Elbow surgery Right 2008    broken   Dr. Orlan Leavens  . Rotator cuff repair Right 2012    Dr. Thomasena Edis  . Squamous cell carcinoma excision Bilateral 2012    [place] on legs   Dr. Irene Limbo  . Retinal detachment surgery N/A     two  . Rotator cuff repair Right     05/2010    Family History  Problem Relation Age of Onset  . Heart disease Father     CHF  . Cancer Mother     breast    Social history:  reports that she quit smoking about 25 years ago. She has never used smokeless tobacco. She reports that she does not drink alcohol or use illicit drugs.  Allergies:  Allergies  Allergen Reactions  . Tetracyclines & Related     Medications:  Current Outpatient Prescriptions on File Prior to Visit  Medication Sig Dispense Refill  . cholecalciferol (VITAMIN D) 1000 UNITS tablet One daily for Vitamin D supplementation.  100 tablet  5  .  escitalopram (LEXAPRO) 10 MG tablet Take 1/2 tablet daily to help depression  45 tablet  4  . lamoTRIgine (LAMICTAL) 100 MG tablet Take 100 mg by mouth. Take one tablet in morning, 1/2 tablet at noon, one tablet in evening      . levETIRAcetam (KEPPRA) 500 MG tablet Take 250 mg by mouth 3 (three) times daily.      . pramipexole (MIRAPEX) 0.25 MG tablet TAKE 1 TABLET ONCE DAILY.  90 tablet  2  . simvastatin (ZOCOR) 10 MG tablet Take 10 mg by mouth at bedtime.      [name] tiotropium (SPIRIVA HANDIHALER) 18 MCG inhalation capsule Inhale contents of one capsule once daily for COPD.  90  capsule  6   No current facility-administered medications on file prior to visit.    ROS:  Out of a complete 14 system review of symptoms, the patient complains only of the following symptoms, and all other reviewed systems are negative.  Fatigue Swelling in the feet Shortness of breath Constipation Easy bruising Joint pain, achy muscles  Memory loss, confusion, seizures Depression Insomnia, restless legs  Blood pressure 146/80, pulse 88, weight 151 lb (68.493 kg).  Physical Exam  General: The patient is alert and cooperative at the time of the examination.  Skin: No significant peripheral edema is noted.   Neurologic Exam  Mental status: Mini-Mental status examination done today shows a total score of 30 out of 30.  Cranial nerves: Facial symmetry is present. Speech is normal, no aphasia or dysarthria is noted. Extraocular movements are full. Visual fields are full.  Motor: The patient has good strength in all 4 extremities.  Coordination: The patient has good finger-nose-finger and heel-to-shin bilaterally.  Gait and station: The patient has a normal gait. Tandem gait is minimally unsteady. Romberg is negative. No drift is seen.  Reflexes: Deep tendon reflexes are symmetric.   Assessment/Plan:  One. History seizures  2. Transient episode of confusion, gait disorder, rule out TIA  3. Restless leg syndrome  The patient will be set up for MRI evaluation of the brain. The patient will remain on aspirin. If a new stroke event is noted, she may be switched to Plavix. The patient will remain on Lamictal, she has had recent blood levels done within the last 6 months that were adequate. The patient will followup through this office in 6 months.  Marlan Palau MD 11/22/2012 2:12 PM  [name] [address] [place], [place] [address]  Phone [phone] Fax [phone]

## 2012-11-22 ENCOUNTER — Encounter: Payer: Self-pay | Admitting: Neurology

## 2012-12-08 ENCOUNTER — Ambulatory Visit
Admission: RE | Admit: 2012-12-08 | Discharge: 2012-12-08 | Disposition: A | Discharge status: Home / Self Care | Payer: 59 | Source: Ambulatory Visit | Attending: Neurology | Admitting: Neurology

## 2012-12-08 ENCOUNTER — Telehealth: Payer: Self-pay | Admitting: Neurology

## 2012-12-08 DIAGNOSIS — G459 Transient cerebral ischemic attack, unspecified: Secondary | ICD-10-CM

## 2012-12-08 DIAGNOSIS — R269 Unspecified abnormalities of gait and mobility: Secondary | ICD-10-CM

## 2012-12-08 DIAGNOSIS — G40909 Epilepsy, unspecified, not intractable, without status epilepticus: Secondary | ICD-10-CM

## 2012-12-08 DIAGNOSIS — F05 Delirium due to known physiological condition: Secondary | ICD-10-CM

## 2012-12-08 NOTE — Telephone Encounter (Signed)
I called patient. The MRI the brain shows a moderate level small vessel disease. No acute changes are seen. The MRI is slightly progressed from 2010. The patient is on aspirin.

## 2013-02-16 LAB — LIPID PANEL
Cholesterol: 162 mg/dL (ref 0–200)
HDL: 60 mg/dL (ref 35–70)
LDL Cholesterol: 87 mg/dL

## 2013-02-16 LAB — HEPATIC FUNCTION PANEL: AST: 16 U/L (ref 13–35)

## 2013-02-16 LAB — BASIC METABOLIC PANEL
Glucose: 82 mg/dL
Sodium: 142 mmol/L (ref 137–147)

## 2013-02-18 ENCOUNTER — Encounter: Payer: Self-pay | Admitting: Internal Medicine

## 2013-02-18 ENCOUNTER — Non-Acute Institutional Stay: Payer: 59 | Admitting: Internal Medicine

## 2013-02-18 VITALS — BP 132/86 | HR 60 | Ht 65.25 in | Wt 158.0 lb

## 2013-02-18 DIAGNOSIS — E785 Hyperlipidemia, unspecified: Secondary | ICD-10-CM

## 2013-02-18 DIAGNOSIS — Z7189 Other specified counseling: Secondary | ICD-10-CM

## 2013-02-18 DIAGNOSIS — C4492 Squamous cell carcinoma of skin, unspecified: Secondary | ICD-10-CM

## 2013-02-18 DIAGNOSIS — G2581 Restless legs syndrome: Secondary | ICD-10-CM

## 2013-02-18 DIAGNOSIS — F329 Major depressive disorder, single episode, unspecified: Secondary | ICD-10-CM

## 2013-02-18 DIAGNOSIS — I10 Essential (primary) hypertension: Secondary | ICD-10-CM

## 2013-02-18 DIAGNOSIS — R609 Edema, unspecified: Secondary | ICD-10-CM

## 2013-02-18 DIAGNOSIS — E559 Vitamin D deficiency, unspecified: Secondary | ICD-10-CM

## 2013-02-18 DIAGNOSIS — G40909 Epilepsy, unspecified, not intractable, without status epilepticus: Secondary | ICD-10-CM

## 2013-02-18 NOTE — Progress Notes (Signed)
Subjective:    Patient ID: [name], female    DOB: [birth year], 79 y.o.   MRN: [id number]  Chief Complaint  Patient presents with  . Medical Managment of Chronic Issues    edema, restless legs, cholesterol. c/o wants white spot on right jaw line check. Wants to talk about DNR, and MOST forms    HPI Unspecified essential hypertension: controlled  Seizure disorder: last in 2011  Cancer of skin, squamous cell: 3mm lesion at the right jaw line  Restless legs syndrome (RLS):stable  Major depressive disorder, single episode, unspecified: improved. Remains on antidepressant.  Edema: improved  Other and unspecified hyperlipidemia: controlled  Unspecified vitamin D deficiency: resolved on supplements    Current Outpatient Prescriptions on File Prior to Visit  Medication Sig Dispense Refill  . albuterol (PROVENTIL HFA;VENTOLIN HFA) 108 (90 BASE) MCG/ACT inhaler Inhale 2 puffs into the lungs every 6 (six) hours as needed for wheezing.      [name] aspirin 81 MG tablet Take 81 mg by mouth daily.      . cholecalciferol (VITAMIN D) 1000 UNITS tablet One daily for Vitamin D supplementation.  100 tablet  5  . escitalopram (LEXAPRO) 10 MG tablet Take 1/2 tablet daily to help depression  45 tablet  4  . lamoTRIgine (LAMICTAL) 100 MG tablet Take 100 mg by mouth. Take one tablet in morning, 1/2 tablet at noon, one tablet in evening      . levETIRAcetam (KEPPRA) 500 MG tablet Take 250 mg by mouth 3 (three) times daily.      . pramipexole (MIRAPEX) 0.25 MG tablet TAKE 1 TABLET ONCE DAILY.  90 tablet  2  . simvastatin (ZOCOR) 10 MG tablet Take 10 mg by mouth at bedtime.      [name] tiotropium (SPIRIVA HANDIHALER) 18 MCG inhalation capsule Inhale contents of one capsule once daily for COPD.  90 capsule  6   No current facility-administered medications on file prior to visit.    Review of Systems  Constitutional: Negative.  Negative for fever, chills, activity change, appetite change, fatigue and  unexpected weight change.  HENT: Negative for congestion, hearing loss, postnasal drip and rhinorrhea.   Eyes: Positive for visual disturbance (Corrective lenses).  Respiratory: Negative.   Cardiovascular: Positive for leg swelling (sometimes in the right leg). Negative for chest pain and palpitations.  Gastrointestinal: Negative.   Endocrine: Negative.   Genitourinary: Negative.   Musculoskeletal: Negative.   Skin: Negative.   Allergic/Immunologic: Negative.   Neurological: Negative.   Hematological: Negative.   Psychiatric/Behavioral:       Hx of depression. Currently controlled.       Objective:BP 132/86  Pulse 60  Ht 5' 5.25" (1.657 m)  Wt 158 lb (71.668 kg)  BMI 26.10 kg/m2    Physical Exam  Constitutional: She is oriented to person, place, and time. She appears well-developed and well-nourished. No distress.  HENT:  Head: Normocephalic.  Right Ear: External ear normal.  Left Ear: External ear normal.  Nose: Nose normal.  Mouth/Throat: Oropharynx is clear and moist.  Eyes: Conjunctivae and EOM are normal. Pupils are equal, round, and reactive to light.  Neck: No JVD present. No tracheal deviation present. No thyromegaly present.  Cardiovascular: Normal rate, regular rhythm, normal heart sounds and intact distal pulses.  Exam reveals no gallop and no friction rub.   No murmur heard. Pulmonary/Chest: No respiratory distress. She has no wheezes. She has no rales.  Abdominal: She exhibits no distension and no mass. There is  no tenderness.  Musculoskeletal: Normal range of motion. She exhibits no edema and no tenderness.  Lymphadenopathy:    She has no cervical adenopathy.  Neurological: She is alert and oriented to person, place, and time. She has normal reflexes. No cranial nerve deficit. Coordination normal.  Skin: No rash noted. No erythema. No pallor.  Psychiatric: She has a normal mood and affect. Her behavior is normal. Judgment and thought content normal.    LAB  REVIEW 02/16/13 CMP; nl  Lipids; TC 162, trig 75, HDL 60, LDL 87  Vit D 33     Assessment & Plan:  Unspecified essential hypertension:controlled  Seizure disorder: last in 2011. Remains on Keppra and Lamictal  Cancer of skin, squamous cell: right jaw. She will contact Dr. Nicholas Lose  Restless legs syndrome (RLS): controlled  Major depressive disorder, single episode, unspecified: improved  Edema: improved  Other and unspecified hyperlipidemia: controlled  Unspecified vitamin D deficiency: improved on supplement  Advanced directives, counseling/discussion:signed MOST and DNR

## 2013-02-18 NOTE — Patient Instructions (Signed)
Continue current medications. 

## 2013-03-09 ENCOUNTER — Encounter: Payer: Self-pay | Admitting: Internal Medicine

## 2013-03-25 ENCOUNTER — Encounter: Payer: 59 | Admitting: Nurse Practitioner

## 2013-03-25 NOTE — Assessment & Plan Note (Signed)
Takes Mirapex 0.25mg  daily

## 2013-03-25 NOTE — Progress Notes (Signed)
Patient ID: [name], female   DOB: 1932/09/08, 79 y.o.   MRN: [id number]   Code Status: living will   Allergies  Allergen Reactions  . Tetracyclines & Related     No chief complaint on file.   HPI: Patient is a 79 y.o. female seen in the clinic at [facility] today for  and other chronic medical conditions.  Problem List Items Addressed This Visit   Restless legs syndrome (RLS) - Primary     Takes Mirapex 0.25mg  daily    Chronic airway obstruction, not elsewhere classified     Takes Spiriva daily and prn Provental.     Unspecified essential hypertension     Controlled w/o meds    Major depressive disorder, single episode, unspecified     Ok with Lexapro 5mg      Seizure disorder     Seizure free since last visit. Takes Levitiracetam 250mg  tid and Lamictal 150mg  bid/75mg  noon.        Review of Systems:  (type .ros)  Past Medical History  Diagnosis Date  . Restless legs syndrome (RLS) 09/12/2011  . Insomnia, unspecified 09/12/2011  . Acute upper respiratory infections of unspecified site 05/23/2011  . Acute bronchitis 05/23/2011  . Chronic airway obstruction, not elsewhere classified 05/23/2011  . Other and unspecified hyperlipidemia 01/31/2011  . Major depressive disorder, single episode, unspecified 01/31/2011  . Unspecified essential hypertension 01/31/2011  . Mitral valve disorders 01/31/2011  . External hemorrhoids without mention of complication 01/31/2011  . Other emphysema 01/31/2011  . Disturbance of salivary secretion 01/31/2011  . Pain in joint, site unspecified 01/31/2011  . Stiffness of joints, not elsewhere classified, multiple sites 01/31/2011  . Lumbago 01/31/2011  . Senile osteoporosis 01/31/2011  . Other convulsions 01/31/2011  . Dizziness and giddiness 01/31/2011  . Spontaneous ecchymoses 01/31/2011  . Retinal detachment with retinal defect of right eye 2011    right eye twice   Past Surgical History  Procedure Laterality Date  .  Tonsillectomy    . Abdominal hysterectomy  2000    total  . Cholecystectomy  2001  . Elbow surgery Right 2008    broken   Dr. Orlan Leavens  . Rotator cuff repair Right 2012    Dr. Thomasena Edis  . Squamous cell carcinoma excision Bilateral 2012    [place] on legs   Dr. Irene Limbo  . Retinal detachment surgery N/A     two  . Rotator cuff repair Right     05/2010  . Squamous cell carcinoma excision Right 11/2012    Dr. Irene Limbo legs   Social History:   reports that she quit smoking about 25 years ago. She has never used smokeless tobacco. She reports that she does not drink alcohol or use illicit drugs.  Family History  Problem Relation Age of Onset  . Heart disease Father     CHF  . Cancer Mother     breast    Medications: Patient's Medications  New Prescriptions   No medications on file  Previous Medications   ALBUTEROL (PROVENTIL HFA;VENTOLIN HFA) 108 (90 BASE) MCG/ACT INHALER    Inhale 2 puffs into the lungs every 6 (six) hours as needed for wheezing.   ASPIRIN 81 MG TABLET    Take 81 mg by mouth daily.   CHOLECALCIFEROL (VITAMIN D) 1000 UNITS TABLET    One daily for Vitamin D supplementation.   ESCITALOPRAM (LEXAPRO) 10 MG TABLET    Take 1/2 tablet daily to help depression   LAMOTRIGINE (LAMICTAL)  100 MG TABLET    Take 100 mg by mouth. Take one tablet in morning, 1/2 tablet at noon, one tablet in evening   LEVETIRACETAM (KEPPRA) 500 MG TABLET    Take 250 mg by mouth 3 (three) times daily.   PRAMIPEXOLE (MIRAPEX) 0.25 MG TABLET    TAKE 1 TABLET ONCE DAILY.   SIMVASTATIN (ZOCOR) 10 MG TABLET    Take 10 mg by mouth at bedtime.   TIOTROPIUM (SPIRIVA HANDIHALER) 18 MCG INHALATION CAPSULE    Inhale contents of one capsule once daily for COPD.  Modified Medications   No medications on file  Discontinued Medications   No medications on file     Physical Exam: (type .physexam) There were no vitals filed for this visit.    Labs reviewed: Basic Metabolic Panel:  Recent Labs  40/98/11   NA 142  K 3.9  BUN 28*  CREATININE 0.9   Liver Function Tests:  Recent Labs  02/16/13  AST 16  ALT 12  ALKPHOS 76   No results found for this basename: LIPASE, AMYLASE,  in the last 8760 hours No results found for this basename: AMMONIA,  in the last 8760 hours CBC: No results found for this basename: WBC, NEUTROABS, HGB, HCT, MCV, PLT,  in the last 8760 hours Lipid Panel:  Recent Labs  02/16/13  CHOL 162  HDL 60  LDLCALC 87  TRIG 75   Anemia Panel: No results found for this basename: FOLATE, IRON, VITAMINB12,  in the last 8760 hours  Past Procedures:     Assessment/Plan Restless legs syndrome (RLS) Takes Mirapex 0.25mg  daily  Chronic airway obstruction, not elsewhere classified Takes Spiriva daily and prn Provental.   Unspecified essential hypertension Controlled w/o meds  Major depressive disorder, single episode, unspecified Ok with Lexapro 5mg    Seizure disorder Seizure free since last visit. Takes Levitiracetam 250mg  tid and Lamictal 150mg  bid/75mg  noon.     Family/ Staff Communication: observe the patient.   Goals of Care: IL  Labs/tests ordered:       This encounter was created in error - please disregard.

## 2013-03-25 NOTE — Assessment & Plan Note (Signed)
Controlled w/o meds 

## 2013-03-25 NOTE — Assessment & Plan Note (Signed)
Ok with Lexapro 5mg 

## 2013-03-25 NOTE — Assessment & Plan Note (Signed)
Takes Spiriva daily and prn Provental.

## 2013-03-25 NOTE — Assessment & Plan Note (Signed)
Seizure free since last visit. Takes Levitiracetam 250mg  tid and Lamictal 150mg  bid/75mg  noon.

## 2013-04-16 ENCOUNTER — Other Ambulatory Visit: Payer: Self-pay | Admitting: Dermatology

## 2013-04-29 ENCOUNTER — Other Ambulatory Visit: Payer: Self-pay | Admitting: Gastroenterology

## 2013-05-24 ENCOUNTER — Ambulatory Visit: Payer: 59 | Admitting: Nurse Practitioner

## 2013-06-03 ENCOUNTER — Ambulatory Visit: Payer: 59 | Admitting: Podiatry

## 2013-06-07 ENCOUNTER — Encounter: Payer: Self-pay | Admitting: Nurse Practitioner

## 2013-06-08 ENCOUNTER — Encounter: Payer: Self-pay | Admitting: Nurse Practitioner

## 2013-06-08 ENCOUNTER — Ambulatory Visit (INDEPENDENT_AMBULATORY_CARE_PROVIDER_SITE_OTHER): Payer: 59 | Admitting: Nurse Practitioner

## 2013-06-08 VITALS — BP 124/82 | HR 80 | Resp 20 | Ht 64.5 in | Wt 160.0 lb

## 2013-06-08 DIAGNOSIS — Z01419 Encounter for gynecological examination (general) (routine) without abnormal findings: Secondary | ICD-10-CM

## 2013-06-08 DIAGNOSIS — Z8543 Personal history of malignant neoplasm of ovary: Secondary | ICD-10-CM

## 2013-06-08 DIAGNOSIS — Z0000 Encounter for general adult medical examination without abnormal findings: Secondary | ICD-10-CM

## 2013-06-08 LAB — POCT URINALYSIS DIPSTICK
BILIRUBIN UA: NEGATIVE
Blood, UA: NEGATIVE
GLUCOSE UA: NEGATIVE
Ketones, UA: NEGATIVE
Leukocytes, UA: NEGATIVE
Nitrite, UA: NEGATIVE
PH UA: 5
Protein, UA: NEGATIVE
Urobilinogen, UA: NEGATIVE

## 2013-06-08 LAB — HEMOGLOBIN, FINGERSTICK: Hemoglobin, fingerstick: 14.5 g/dL (ref 12.0–16.0)

## 2013-06-08 NOTE — Patient Instructions (Signed)

## 2013-06-08 NOTE — Progress Notes (Signed)
Patient ID: [name], female   DOB: [birth year], 80 y.o.   MRN: [id number] 80 y.o. G0P0 Divorced Caucasian Fe here for annual exam.  She still resides at [facility].  She has had several skin cancers removed this past 2 years.  She feels well in general.  No LMP recorded. Patient has had a hysterectomy.          Sexually active: no  The current method of family planning is abstinence.    Exercising: no  The patient does not participate in regular exercise at present. Smoker:  no  Health Maintenance: Pap:  05/01/11, WNL (TAH/BSO) MMG:  05/19/13, negative Colonoscopy:  05/2013, polyp x 2, no repeat due to age BMD:  09/23/12,  TDaP:  2013 Labs:  HB:14.5  Urine:  Negative, pH 5.0   reports that she quit smoking about 26 years ago. She has never used smokeless tobacco. She reports that she does not drink alcohol or use illicit drugs.  Past Medical History  Diagnosis Date  . Restless legs syndrome (RLS) 09/12/2011  . Insomnia, unspecified 09/12/2011  . Acute upper respiratory infections of unspecified site 05/23/2011  . Acute bronchitis 05/23/2011  . Chronic airway obstruction, not elsewhere classified 05/23/2011  . Other and unspecified hyperlipidemia 01/31/2011  . Major depressive disorder, single episode, unspecified 01/31/2011  . Unspecified essential hypertension 01/31/2011  . Mitral valve disorders 01/31/2011  . External hemorrhoids without mention of complication 96/29/5284  . Other emphysema 01/31/2011  . Disturbance of salivary secretion 01/31/2011  . Pain in joint, site unspecified 01/31/2011  . Stiffness of joints, not elsewhere classified, multiple sites 01/31/2011  . Lumbago 01/31/2011  . Senile osteoporosis 01/31/2011  . Other convulsions 01/31/2011  . Dizziness and giddiness 01/31/2011  . Spontaneous ecchymoses 01/31/2011  . Retinal detachment with retinal defect of right eye 2011    right eye twice    Past Surgical History  Procedure Laterality Date  .  Tonsillectomy  1941  . Abdominal hysterectomy  2000    TAH/BSO, omenectomy PSB resect, Stg IC cystadenofibroma  . Cholecystectomy  2005    Dr. Marlou Starks  . Elbow surgery Right 2008    broken   Dr. Apolonio Schneiders  . Rotator cuff repair Right 2012    Dr. Theda Sers  . Squamous cell carcinoma excision Bilateral 2012, 8/14    [place] on legs   Dr. Sarajane Jews  . Retinal detachment surgery N/A     two    Current Outpatient Prescriptions  Medication Sig Dispense Refill  . albuterol (PROVENTIL HFA;VENTOLIN HFA) 108 (90 BASE) MCG/ACT inhaler Inhale 2 puffs into the lungs every 6 (six) hours as needed for wheezing.      [name] aspirin 81 MG tablet Take 81 mg by mouth daily.      . cholecalciferol (VITAMIN D) 1000 UNITS tablet One daily for Vitamin D supplementation.  100 tablet  5  . escitalopram (LEXAPRO) 10 MG tablet Take 1/2 tablet daily to help depression  45 tablet  4  . lamoTRIgine (LAMICTAL) 100 MG tablet Take 100 mg by mouth. Take one tablet in morning, 1/2 tablet at noon, one tablet in evening      . levETIRAcetam (KEPPRA) 500 MG tablet Take 250 mg by mouth 3 (three) times daily.      . pramipexole (MIRAPEX) 0.25 MG tablet TAKE 1 TABLET ONCE DAILY.  90 tablet  2  . simvastatin (ZOCOR) 10 MG tablet Take 10 mg by mouth at bedtime.      [name]  tiotropium (SPIRIVA HANDIHALER) 18 MCG inhalation capsule Inhale contents of one capsule once daily for COPD.  90 capsule  6   No current facility-administered medications for this visit.    Family History  Problem Relation Age of Onset  . Heart disease Father     CHF  . Cancer Mother     breast    ROS:  Pertinent items are noted in HPI.  Otherwise, a comprehensive ROS was negative.  Exam:   BP 124/82  Pulse 80  Resp 20  Ht 5' 4.5" (1.638 m)  Wt 160 lb (72.576 kg)  BMI 27.05 kg/m2 Height: 5' 4.5" (163.8 cm)  Ht Readings from Last 3 Encounters:  06/08/13 5' 4.5" (1.638 m)  02/18/13 5' 5.25" (1.657 m)  05/27/12 5\' 5"  (1.651 m)    General appearance: alert,  cooperative and appears stated age Head: Normocephalic, without obvious abnormality, atraumatic Neck: no adenopathy, supple, symmetrical, trachea midline and thyroid normal to inspection and palpation Lungs: clear to auscultation bilaterally Breasts: normal appearance, no masses or tenderness Heart: regular rate and rhythm Abdomen: soft, non-tender; no masses,  no organomegaly Extremities: extremities normal, atraumatic, no cyanosis or edema Skin: Skin color, texture, turgor normal. No rashes or lesions Lymph nodes: Cervical, supraclavicular, and axillary nodes normal. No abnormal inguinal nodes palpated Neurologic: Grossly normal   Pelvic: External genitalia:  no lesions              Urethra:  normal appearing urethra with no masses, tenderness or lesions              Bartholin's and Skene's: normal                 Vagina: normal appearing vagina with normal color and discharge, no lesions              Cervix: absent              Pap taken: no Bimanual Exam:  Uterus:  uterus absent              Adnexa: no mass, fullness, tenderness               Rectovaginal: Confirms               Anus:  normal sphincter tone, no lesions  A:  Well Woman with normal exam  S/P TAH/BSO w/ Omenctomy Stage IC OV cancer 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

## 2013-06-09 LAB — CA 125: CA 125: 25.4 U/mL (ref 0.0–30.2)

## 2013-06-09 NOTE — Progress Notes (Signed)
Encounter reviewed by Dr. Brook Silva.  

## 2013-06-16 ENCOUNTER — Telehealth: Payer: Self-pay | Admitting: *Deleted

## 2013-06-16 NOTE — Telephone Encounter (Signed)
Message copied by Graylon Good on Wed Jun 16, 2013  4:25 PM ------      Message from: GRUBB, PATRICIA R      Created: Thu Jun 10, 2013  2:25 PM       Let pattien know that CA 125 was normal ------

## 2013-06-16 NOTE — Telephone Encounter (Signed)
I have attempted to contact this patient by phone with the following results: left message to return my call on answering machine (home).  Advised OK to call tomorrow if she does not get message prior to phones turning off at 430pm.

## 2013-06-16 NOTE — Telephone Encounter (Signed)
Pt notified in result note.  

## 2013-06-29 ENCOUNTER — Other Ambulatory Visit: Payer: Self-pay

## 2013-06-29 MED ORDER — PRAMIPEXOLE DIHYDROCHLORIDE 0.25 MG PO TABS: 0.2500 mg | ORAL_TABLET | Freq: Every day | ORAL | Status: DC

## 2013-07-06 ENCOUNTER — Other Ambulatory Visit: Payer: Self-pay | Admitting: *Deleted

## 2013-07-06 MED ORDER — SIMVASTATIN 10 MG PO TABS: 10.0000 mg | ORAL_TABLET | Freq: Every day | ORAL | Status: DC

## 2013-07-06 NOTE — Telephone Encounter (Signed)
[facility]  

## 2013-07-08 NOTE — Telephone Encounter (Signed)
Closing encounter

## 2013-08-04 ENCOUNTER — Other Ambulatory Visit: Payer: Self-pay | Admitting: Dermatology

## 2013-08-05 ENCOUNTER — Telehealth: Payer: Self-pay | Admitting: *Deleted

## 2013-08-05 ENCOUNTER — Other Ambulatory Visit: Payer: Self-pay | Admitting: Neurology

## 2013-08-05 MED ORDER — PRAMIPEXOLE DIHYDROCHLORIDE 0.5 MG PO TABS: 0.5000 mg | ORAL_TABLET | Freq: Every day | ORAL | Status: DC

## 2013-08-05 NOTE — Telephone Encounter (Signed)
Patient calling requesting if she could decrease dosage to 1/2 a day of pramipexole (MIRAPEX) 0.25 MG tablet.  She experiencing increased restless leg syndrome.  Please call and advice.

## 2013-08-05 NOTE — Telephone Encounter (Signed)
I called patient. The patient is having increased symptoms with restless legs. The patient is on 0.25 mg of the Mirapex, we will go to 0.5 mg at night.

## 2013-08-05 NOTE — Telephone Encounter (Signed)
Patient says she is having increased RLS symptoms and would like to discuss a possible dose change on Mirapex.  Please advise.  Thank you.

## 2013-08-09 ENCOUNTER — Telehealth: Payer: Self-pay | Admitting: *Deleted

## 2013-08-09 NOTE — Telephone Encounter (Signed)
Patient stated she has been taking the 10mg  but doesn't understand who changed the dose. Said she will talk with [facility].

## 2013-08-09 NOTE — Telephone Encounter (Signed)
The last lipid panel showed her to be under good control. If she was taking 10 mg last year, it does not appear necessary to increase to 40 mg. How much is she actually taking now, and can we confirm her prescription with either her or the drug store?

## 2013-08-09 NOTE — Telephone Encounter (Signed)
Patient called and stated that she is suppose to be on Simvastatin 40mg  and not 10mg . Would like a Refill for Simvastatin 40mg  called in. I checked back in her history and all I see is Simvastatin 10mg . Please Advise.

## 2013-08-16 ENCOUNTER — Encounter: Payer: Self-pay | Admitting: Nurse Practitioner

## 2013-08-26 ENCOUNTER — Other Ambulatory Visit: Payer: Self-pay | Admitting: Dermatology

## 2013-09-12 ENCOUNTER — Other Ambulatory Visit: Payer: Self-pay | Admitting: Neurology

## 2013-09-16 ENCOUNTER — Encounter: Payer: Self-pay | Admitting: Internal Medicine

## 2013-09-21 ENCOUNTER — Ambulatory Visit: Payer: 59 | Admitting: Nurse Practitioner

## 2013-09-24 ENCOUNTER — Telehealth: Payer: Self-pay

## 2013-09-24 NOTE — Telephone Encounter (Signed)
Patient doesn't understand why her Simvastatin was changed from 40mg  to 10mg . Reviewed records with Dr. Nyoka Cowden, he compared her Lipids done 02/16/13 on the 40mg  with Lipid results done 09/09/13 on 10mg  and there wasn't that much changed. He would like for her to stay on Simvastatin 10mg  for now, if it will keep things under control the better she would be, less reaction on lower dose. Will repeat labs 01/06/14 BMP, Lipid prior to her physical 01/13/14 with [facility]. Patient is in agreement with this.

## 2013-11-08 ENCOUNTER — Other Ambulatory Visit: Payer: Self-pay | Admitting: Neurology

## 2013-11-29 ENCOUNTER — Encounter: Payer: Self-pay | Admitting: Adult Health

## 2013-11-29 ENCOUNTER — Ambulatory Visit (INDEPENDENT_AMBULATORY_CARE_PROVIDER_SITE_OTHER): Payer: 59 | Admitting: Adult Health

## 2013-11-29 VITALS — BP 154/97 | HR 62 | Ht 69.0 in | Wt 153.0 lb

## 2013-11-29 DIAGNOSIS — R269 Unspecified abnormalities of gait and mobility: Secondary | ICD-10-CM

## 2013-11-29 DIAGNOSIS — Z5181 Encounter for therapeutic drug level monitoring: Secondary | ICD-10-CM

## 2013-11-29 NOTE — Progress Notes (Signed)
I have read the note, and I agree with the clinical assessment and plan.  Valon Glasscock KEITH   

## 2013-11-29 NOTE — Patient Instructions (Signed)

## 2013-11-29 NOTE — Progress Notes (Signed)
PATIENT: [name] DOB: Aug 31, 1932  REASON FOR VISIT: follow up HISTORY FROM: patient  HISTORY OF PRESENT ILLNESS: [name] is a 80 year old female with a history of seizures. She returns today for follow-up. She is currently taking lamictal and keppra tolerating it well. She reports that she has not had any seizure events since the last visit. She denies have any confusional events or change in balance or gait. She continues to take asprin daily. She states that her balance has become an issue. She uses her cane when ambulating. Denies any falls but has come close. She also has a history of RLS. She currently takes Mirapex 0.25 at night. She had the Mirapex dose increased but later realized she had never taken the medication. She has the 0.5 tablets but she cuts those in half. Denies any new neurological complaints. No new medical issues since last seen.   HISTORY 11/20/12 (CW): history of seizures, currently on Lamictal. The last seizure was in 2011. The patient returns to this office for an evaluation of an event that occurred in the first week of July 2014. The patient had been eating a meal, and suddenly she seemed to be confused, unable to use a fork and knife. For about 2 weeks after this event, the patient had a significant worsening of balance. The patient has since resolved with her deficits, and she feels that she is back to normal. The patient does have mild chronic gait disorder, but she denies any recent falls. The patient denied any focal numbness or weakness of the face, arms, or legs. The patient had no speech changes or headache. The patient has undergone a carotid Doppler study that was unremarkable. The patient is on low-dose aspirin, and she was on aspirin at the time of the event described above. The patient has been set up for a MRI of the brain, but this has not yet been done. The patient has mild small vessel disease on a prior MRI done in 2010. The patient indicates that  she did not miss any doses of her Lamictal around the time of the above event.   REVIEW OF SYSTEMS: Full 14 system review of systems performed and notable only for:  Constitutional: N/A  Eyes: Eye discharge Ear/Nose/Throat: N/A  Skin: N/A  Cardiovascular: Ankle swelling  Respiratory: Shortness of breath Gastrointestinal: N/A  Genitourinary: Swollen abdomen, nausea Hematology/Lymphatic:  Bruise easily Endocrine: N/A Musculoskeletal: Joint pain, back pain, walking difficulty Allergy/Immunology: Environmental allergies Neurological: Memory loss Psychiatric: Depression Sleep: Restless leg   ALLERGIES: Allergies  Allergen Reactions  . Tetracyclines & Related     HOME MEDICATIONS: Outpatient Prescriptions Prior to Visit  Medication Sig Dispense Refill  . albuterol (PROVENTIL HFA;VENTOLIN HFA) 108 (90 BASE) MCG/ACT inhaler Inhale 2 puffs into the lungs every 6 (six) hours as needed for wheezing.      [name] aspirin 81 MG tablet Take 81 mg by mouth daily.      . cholecalciferol (VITAMIN D) 1000 UNITS tablet One daily for Vitamin D supplementation.  100 tablet  5  . escitalopram (LEXAPRO) 10 MG tablet Take 1/2 tablet daily to help depression  45 tablet  4  . lamoTRIgine (LAMICTAL) 150 MG tablet TAKE 1 TABLET TWICE DAILY AND TAKE 1/2 TABLET AT LUNCH.  75 tablet  0  . levETIRAcetam (KEPPRA) 500 MG tablet TAKE (1/2) TABLET THREE TIMES DAILY.  135 tablet  0  . pramipexole (MIRAPEX) 0.5 MG tablet Take 1 tablet (0.5 mg total)  by mouth at bedtime.  30 tablet  3  . simvastatin (ZOCOR) 10 MG tablet Take 1 tablet (10 mg total) by mouth at bedtime.  90 tablet  1  . tiotropium (SPIRIVA HANDIHALER) 18 MCG inhalation capsule Inhale contents of one capsule once daily for COPD.  90 capsule  6   No facility-administered medications prior to visit.    PAST MEDICAL HISTORY: Past Medical History  Diagnosis Date  . Restless legs syndrome (RLS) 09/12/2011  . Insomnia, unspecified 09/12/2011  . Acute upper  respiratory infections of unspecified site 05/23/2011  . Acute bronchitis 05/23/2011  . Chronic airway obstruction, not elsewhere classified 05/23/2011  . Other and unspecified hyperlipidemia 01/31/2011  . Major depressive disorder, single episode, unspecified 01/31/2011  . Unspecified essential hypertension 01/31/2011  . Mitral valve disorders 01/31/2011  . External hemorrhoids without mention of complication 16/04/930  . Other emphysema 01/31/2011  . Disturbance of salivary secretion 01/31/2011  . Pain in joint, site unspecified 01/31/2011  . Stiffness of joints, not elsewhere classified, multiple sites 01/31/2011  . Lumbago 01/31/2011  . Senile osteoporosis 01/31/2011  . Other convulsions 01/31/2011  . Dizziness and giddiness 01/31/2011  . Spontaneous ecchymoses 01/31/2011  . Retinal detachment with retinal defect of right eye 2011    right eye twice    PAST SURGICAL HISTORY: Past Surgical History  Procedure Laterality Date  . Tonsillectomy  1941  . Abdominal hysterectomy  2000    TAH/BSO, omenectomy PSB resect, Stg IC cystadenofibroma  . Cholecystectomy  2005    Dr. Marlou Starks  . Elbow surgery Right 2008    broken   Dr. Apolonio Schneiders  . Rotator cuff repair Right 2012    Dr. Theda Sers  . Squamous cell carcinoma excision Bilateral 2012, 8/14    Mohns on legs   Dr. Sarajane Jews  . Retinal detachment surgery N/A     two    FAMILY HISTORY: Family History  Problem Relation Age of Onset  . Heart disease Father     CHF  . Cancer Mother     breast    SOCIAL HISTORY: History   Social History  . Marital Status: Divorced    Spouse Name: N/A    Number of Children: 0  . Years of Education: N/A   Occupational History  . retired Tourist information centre manager for [employer]    Social History Main Topics  . Smoking status: Former Smoker    Quit date: 04/09/1987  . Smokeless tobacco: Never Used     Comment: quit in 1990  . Alcohol Use: No  . Drug Use: No  . Sexual Activity: No   Other Topics  Concern  . Not on file   Social History Narrative   L[facility] at [facility]   No children   Divorced      PHYSICAL EXAM  Filed Vitals:   11/29/13 1027  BP: 154/97  Pulse: 62  Height: $Remove'5\' 9"'QobGwfx$  (1.753 m)  Weight: 153 lb (69.4 kg)   Body mass index is 22.58 kg/(m^2).  Generalized: Well developed, in no acute distress   Neurological examination  Mentation: Alert oriented to time, place, history taking. Follows all commands speech and language fluent Cranial nerve II-XII: Pupils were equal round reactive to light. Extraocular movements were full, visual field were full on confrontational test. Facial sensation and strength were normal.Head turning and shoulder shrug  were normal and symmetric. Motor: The motor testing reveals 5 over 5 strength of all 4 extremities. Good symmetric motor tone is noted throughout.  Sensory: Sensory testing is intact to soft touch on all 4 extremities. No evidence of extinction is noted.  Coordination: Cerebellar testing reveals good finger-nose-finger and heel-to-shin bilaterally.  Gait and station: Gait is normal. Tandem gait is unsteady. Romberg is positive. No drift is seen.  Reflexes: Deep tendon reflexes are symmetric and normal bilaterally.    DIAGNOSTIC DATA (LABS, IMAGING, TESTING) - I reviewed patient records, labs, notes, testing and imaging myself where available.  Lab Results  Component Value Date   WBC 9.4 08/16/2009   HGB 13.5 08/16/2009   HCT 40.3 08/16/2009   MCV 95.2 08/16/2009   PLT 213 08/16/2009      Component Value Date/Time   NA 142 02/16/2013   NA 138 08/16/2009 1549   K 3.9 02/16/2013   CL 106 08/16/2009 1549   CO2 22 08/16/2009 1549   GLUCOSE 102* 08/16/2009 1549   BUN 28* 02/16/2013   BUN 15 08/16/2009 1549   CREATININE 0.9 02/16/2013   CREATININE 0.88 08/16/2009 1549   CALCIUM 8.9 08/16/2009 1549   AST 16 02/16/2013   ALT 12 02/16/2013   ALKPHOS 76 02/16/2013   GFRNONAA >60 08/16/2009 1549   GFRAA  Value: >60         The eGFR has been calculated using the MDRD equation. This calculation has not been validated in all clinical situations. eGFR's persistently <60 mL/min signify possible Chronic Kidney Disease. 08/16/2009 1549   Lab Results  Component Value Date   CHOL 162 02/16/2013   HDL 60 02/16/2013   LDLCALC 87 02/16/2013   TRIG 75 02/16/2013    ASSESSMENT AND PLAN 80 y.o. year old female  has a past medical history of Restless legs syndrome (RLS) (09/12/2011); Insomnia, unspecified (09/12/2011); Acute upper respiratory infections of unspecified site (05/23/2011); Acute bronchitis (05/23/2011); Chronic airway obstruction, not elsewhere classified (05/23/2011); Other and unspecified hyperlipidemia (01/31/2011); Major depressive disorder, single episode, unspecified (01/31/2011); Unspecified essential hypertension (01/31/2011); Mitral valve disorders (01/31/2011); External hemorrhoids without mention of complication (62/22/9798); Other emphysema (01/31/2011); Disturbance of salivary secretion (01/31/2011); Pain in joint, site unspecified (01/31/2011); Stiffness of joints, not elsewhere classified, multiple sites (01/31/2011); Lumbago (01/31/2011); Senile osteoporosis (01/31/2011); Other convulsions (01/31/2011); Dizziness and giddiness (01/31/2011); Spontaneous ecchymoses (01/31/2011); and Retinal detachment with retinal defect of right eye (2011). here with;  1. Seizures 2. Abnormality of gait-balance  Patient seizures have remained under good control with Keppra and Lamictal. She recently had blood work drawn at [facility] but not a Lamictal level. I will check that today. Patient continues to have some balance issues. On different occasions she has almost fallen but was able to catch herself. I will refer her to physical therapy to help improve her balance. She should follow-up in about 4 months or sooner if needed.    Ward Givens, MSN, NP-C 11/29/2013, 10:47 AM [name] [address], [place],  [address] [phone]  Note: This document was prepared with digital dictation and possible smart phrase technology. Any transcriptional errors that result from this process are unintentional.

## 2013-11-30 LAB — LAMOTRIGINE LEVEL: LAMOTRIGINE LVL: 16.9 ug/mL (ref 2.0–20.0)

## 2013-12-01 NOTE — Progress Notes (Signed)
Quick Note:  Called patient no answer, voice message was left with normal Lamotrigine level and to call back with any questions or concerns. ______

## 2013-12-06 ENCOUNTER — Other Ambulatory Visit: Payer: Self-pay | Admitting: Neurology

## 2013-12-14 ENCOUNTER — Telehealth: Payer: Self-pay | Admitting: Adult Health

## 2013-12-14 NOTE — Telephone Encounter (Signed)
Patient stated Physical Therapy for balance training was forwarded to [facility], instead of [facility] where she resides.  Please call Tiffany @ [phone] to get fax number.

## 2013-12-15 NOTE — Telephone Encounter (Signed)
Spoke with Tiffany at [facility], referral was faxed to [phone].

## 2013-12-16 ENCOUNTER — Other Ambulatory Visit: Payer: Self-pay | Admitting: Dermatology

## 2013-12-18 ENCOUNTER — Other Ambulatory Visit: Payer: Self-pay | Admitting: Nurse Practitioner

## 2013-12-18 ENCOUNTER — Other Ambulatory Visit: Payer: Self-pay | Admitting: Neurology

## 2013-12-20 NOTE — Telephone Encounter (Signed)
Per last OV note patient takes 0.25 mg daily.

## 2013-12-27 ENCOUNTER — Other Ambulatory Visit: Payer: Self-pay | Admitting: *Deleted

## 2013-12-27 DIAGNOSIS — F329 Major depressive disorder, single episode, unspecified: Secondary | ICD-10-CM

## 2013-12-27 MED ORDER — ESCITALOPRAM OXALATE 10 MG PO TABS
ORAL_TABLET | ORAL | Status: DC
Start: 2013-12-27 — End: 2014-07-25

## 2013-12-27 MED ORDER — SIMVASTATIN 10 MG PO TABS: 10.0000 mg | ORAL_TABLET | Freq: Every day | ORAL | Status: DC

## 2013-12-27 NOTE — Telephone Encounter (Signed)
[facility]  

## 2014-01-13 ENCOUNTER — Encounter: Payer: Self-pay | Admitting: Nurse Practitioner

## 2014-01-24 ENCOUNTER — Other Ambulatory Visit: Payer: Self-pay | Admitting: *Deleted

## 2014-01-24 MED ORDER — TIOTROPIUM BROMIDE MONOHYDRATE 18 MCG IN CAPS: ORAL_CAPSULE | RESPIRATORY_TRACT | Status: DC

## 2014-01-24 NOTE — Telephone Encounter (Signed)
[facility]  

## 2014-02-24 ENCOUNTER — Other Ambulatory Visit: Payer: Self-pay | Admitting: Dermatology

## 2014-03-24 ENCOUNTER — Encounter: Payer: Self-pay | Admitting: Nurse Practitioner

## 2014-03-24 ENCOUNTER — Non-Acute Institutional Stay: Payer: 59 | Admitting: Nurse Practitioner

## 2014-03-24 VITALS — BP 124/74 | HR 82 | Wt 158.0 lb

## 2014-03-24 DIAGNOSIS — I10 Essential (primary) hypertension: Secondary | ICD-10-CM

## 2014-03-24 DIAGNOSIS — G40909 Epilepsy, unspecified, not intractable, without status epilepticus: Secondary | ICD-10-CM

## 2014-03-24 DIAGNOSIS — G2581 Restless legs syndrome: Secondary | ICD-10-CM

## 2014-03-24 DIAGNOSIS — J449 Chronic obstructive pulmonary disease, unspecified: Secondary | ICD-10-CM

## 2014-03-24 DIAGNOSIS — F324 Major depressive disorder, single episode, in partial remission: Secondary | ICD-10-CM

## 2014-03-24 NOTE — Assessment & Plan Note (Signed)
Onset was after she is off Phenobarbital--currently controlled on Mirapex    

## 2014-03-24 NOTE — Assessment & Plan Note (Signed)
Last seizure activities was about 2 years ago, Levetiracetam was added in addition to Lamictal.

## 2014-03-24 NOTE — Assessment & Plan Note (Signed)
Controlled.  

## 2014-03-24 NOTE — Assessment & Plan Note (Signed)
Stable on Spiriva,   

## 2014-03-24 NOTE — Assessment & Plan Note (Signed)
Stable on Lexapro 10mg  

## 2014-03-27 NOTE — Progress Notes (Signed)
Patient ID: [name], female   DOB: [birth year], 80 y.o.   MRN: [id number]   Allergies  Allergen Reactions  . Tetracyclines & Related     Chief Complaint  Patient presents with  . Medical Management of Chronic Issues    blood pressure. [employer] Qptum form    HPI: Patient is a 80 y.o. female seen in the clinic at [facility] today for chronic medical conditions.  Problem List Items Addressed This Visit    Seizure disorder    Last seizure activities was about 2 years ago, Levetiracetam was added in addition to Lamictal.      Restless legs syndrome (RLS)    Onset was after she is off Phenobarbital--currently controlled on Mirapex      Major depressive disorder, single episode    Stable on Lexapro 10mg       Essential hypertension    Controlled.     COPD (chronic obstructive pulmonary disease) - Primary    Stable on Spiriva,         Review of Systems:  Review of Systems  Constitutional: Negative for fever, chills, weight loss, malaise/fatigue and diaphoresis.  HENT: Positive for hearing loss. Negative for ear discharge, ear pain, nosebleeds and sore throat.   Eyes: Negative for blurred vision, double vision, photophobia, pain (lateral upper right eyelid. ), discharge and redness.  Respiratory: Negative for cough, sputum production, shortness of breath, wheezing and stridor.   Cardiovascular: Positive for leg swelling (RLE). Negative for chest pain, palpitations, orthopnea, claudication and PND.       1+edema BLE feet/ankles  Gastrointestinal: Negative for heartburn, nausea, vomiting, abdominal pain, diarrhea, constipation and blood in stool.       BM not regular as prior her colonoscopy-recommend probiotic today.   Genitourinary: Negative for dysuria, urgency, frequency, hematuria and flank pain.  Musculoskeletal: Negative for myalgias, back pain, joint pain, falls and neck pain.       Walking. Risk for falling is low  Skin: Negative for itching  and rash.       Right middle shin MOHS surgical scar for skin cancer   Neurological: Positive for seizures. Negative for dizziness, tingling, tremors, sensory change, speech change, focal weakness, loss of consciousness, weakness and headaches.  Endo/Heme/Allergies: Negative for environmental allergies and polydipsia. Does not bruise/bleed easily.  Psychiatric/Behavioral: Positive for depression. Negative for hallucinations and memory loss. The patient is not nervous/anxious and does not have insomnia.        Her depression is well managed-no depressive incident.  6CTD test score 0 03/24/14     Past Medical History  Diagnosis Date  . Restless legs syndrome (RLS) 09/12/2011  . Insomnia, unspecified 09/12/2011  . Acute upper respiratory infections of unspecified site 05/23/2011  . Acute bronchitis 05/23/2011  . Chronic airway obstruction, not elsewhere classified 05/23/2011  . Other and unspecified hyperlipidemia 01/31/2011  . Major depressive disorder, single episode, unspecified 01/31/2011  . Unspecified essential hypertension 01/31/2011  . Mitral valve disorders 01/31/2011  . External hemorrhoids without mention of complication 85/05/7739  . Other emphysema 01/31/2011  . Disturbance of salivary secretion 01/31/2011  . Pain in joint, site unspecified 01/31/2011  . Stiffness of joints, not elsewhere classified, multiple sites 01/31/2011  . Lumbago 01/31/2011  . Senile osteoporosis 01/31/2011  . Other convulsions 01/31/2011  . Dizziness and giddiness 01/31/2011  . Spontaneous ecchymoses 01/31/2011  . Retinal detachment with retinal defect of right eye 2011    right eye twice   Past Surgical History  Procedure Laterality Date  . Tonsillectomy  1941  . Abdominal hysterectomy  2000    TAH/BSO, omenectomy PSB resect, Stg IC cystadenofibroma  . Cholecystectomy  2005    Dr. Marlou Starks  . Elbow surgery Right 2008    broken   Dr. Apolonio Schneiders  . Rotator cuff repair Right 2012    Dr. Theda Sers  .  Squamous cell carcinoma excision Bilateral 2012, 8/14    Mohns on legs   Dr. Sarajane Jews  . Retinal detachment surgery N/A     two   Social History:   reports that she quit smoking about 26 years ago. She has never used smokeless tobacco. She reports that she does not drink alcohol or use illicit drugs.  Family History  Problem Relation Age of Onset  . Heart disease Father     CHF  . Cancer Mother     breast    Medications: Patient's Medications  New Prescriptions   No medications on file  Previous Medications   ALBUTEROL (PROVENTIL HFA;VENTOLIN HFA) 108 (90 BASE) MCG/ACT INHALER    Inhale 2 puffs into the lungs every 6 (six) hours as needed for wheezing.   ALCLOMETHASONE (ACLOVATE) 0.05 % CREAM       ASPIRIN 81 MG TABLET    Take 81 mg by mouth daily.   CHOLECALCIFEROL (VITAMIN D) 1000 UNITS TABLET    One daily for Vitamin D supplementation.   ESCITALOPRAM (LEXAPRO) 10 MG TABLET    Take 1/2 tablet daily to help depression   LAMOTRIGINE (LAMICTAL) 150 MG TABLET    TAKE 1 TABLET TWICE DAILY AND TAKE 1/2 TABLET AT LUNCH.   LEVETIRACETAM (KEPPRA) 500 MG TABLET    TAKE (1/2) TABLET THREE TIMES DAILY.   PRAMIPEXOLE (MIRAPEX) 0.25 MG TABLET    TAKE 1 TABLET ONCE DAILY.   SIMVASTATIN (ZOCOR) 10 MG TABLET    Take 1 tablet (10 mg total) by mouth at bedtime.   TIOTROPIUM (SPIRIVA HANDIHALER) 18 MCG INHALATION CAPSULE    Inhale contents of one capsule once daily for COPD  Modified Medications   No medications on file  Discontinued Medications   No medications on file     Physical Exam: Physical Exam  Constitutional: She is oriented to person, place, and time. She appears well-developed and well-nourished. No distress.  HENT:  Head: Normocephalic and atraumatic.  Right Ear: External ear normal.  Left Ear: External ear normal.  Nose: Nose normal.  Mouth/Throat: Oropharynx is clear and moist. No oropharyngeal exudate.  Eyes: Conjunctivae and EOM are normal. Pupils are equal, round, and  reactive to light. Right eye exhibits no discharge. Left eye exhibits no discharge. No scleral icterus.  Neck: Normal range of motion. Neck supple. No JVD present. No tracheal deviation present. No thyromegaly present.  Cardiovascular: Normal rate, regular rhythm and normal heart sounds.   No murmur heard. Pulmonary/Chest: Effort normal and breath sounds normal. No respiratory distress. She has no wheezes. She has no rales. She exhibits no tenderness.  Abdominal: Soft. Bowel sounds are normal. She exhibits no distension. There is no tenderness. There is no rebound.  Musculoskeletal: Normal range of motion. She exhibits edema (RLE). She exhibits no tenderness.  1+ edema bil feet/ankles. Walking independent. Low risk for falling.   Lymphadenopathy:    She has no cervical adenopathy.  Neurological: She is alert and oriented to person, place, and time. She has normal reflexes. No cranial nerve deficit. She exhibits normal muscle tone. Coordination normal.  Skin: Skin is warm and dry.  No rash noted. She is not diaphoretic. No erythema (right lower leg peri-surgical incision. ).  Psychiatric: She has a normal mood and affect. Her behavior is normal. Judgment and thought content normal.  No depressive incident. 6CTD dementia score-0 03/24/14.     Filed Vitals:   03/24/14 1731  BP: 124/74  Pulse: 82  Weight: 158 lb (71.668 kg)          Assessment/Plan COPD (chronic obstructive pulmonary disease) Stable on Spiriva,    Major depressive disorder, single episode Stable on Lexapro 10mg     Restless legs syndrome (RLS) Onset was after she is off Phenobarbital--currently controlled on Mirapex    Seizure disorder Last seizure activities was about 2 years ago, Levetiracetam was added in addition to Lamictal.    Essential hypertension Controlled.     Family/ Staff Communication: observe  Goals of Care:[place]  Labs/tests ordered: none

## 2014-04-04 ENCOUNTER — Ambulatory Visit: Payer: 59 | Admitting: Adult Health

## 2014-04-25 ENCOUNTER — Ambulatory Visit (INDEPENDENT_AMBULATORY_CARE_PROVIDER_SITE_OTHER): Payer: Medicare Other | Admitting: Neurology

## 2014-04-25 ENCOUNTER — Encounter: Payer: Self-pay | Admitting: Neurology

## 2014-04-25 ENCOUNTER — Ambulatory Visit: Payer: 59 | Admitting: Adult Health

## 2014-04-25 VITALS — BP 138/78 | HR 86 | Ht 65.0 in | Wt 158.8 lb

## 2014-04-25 DIAGNOSIS — R5383 Other fatigue: Secondary | ICD-10-CM

## 2014-04-25 DIAGNOSIS — G250 Essential tremor: Secondary | ICD-10-CM

## 2014-04-25 DIAGNOSIS — E538 Deficiency of other specified B group vitamins: Secondary | ICD-10-CM

## 2014-04-25 DIAGNOSIS — G40909 Epilepsy, unspecified, not intractable, without status epilepticus: Secondary | ICD-10-CM

## 2014-04-25 DIAGNOSIS — R5381 Other malaise: Secondary | ICD-10-CM

## 2014-04-25 DIAGNOSIS — F0390 Unspecified dementia without behavioral disturbance: Secondary | ICD-10-CM | POA: Insufficient documentation

## 2014-04-25 DIAGNOSIS — R269 Unspecified abnormalities of gait and mobility: Secondary | ICD-10-CM | POA: Insufficient documentation

## 2014-04-25 DIAGNOSIS — R413 Other amnesia: Secondary | ICD-10-CM

## 2014-04-25 DIAGNOSIS — G2581 Restless legs syndrome: Secondary | ICD-10-CM

## 2014-04-25 HISTORY — DX: Other amnesia: R41.3

## 2014-04-25 HISTORY — DX: Unspecified abnormalities of gait and mobility: R26.9

## 2014-04-25 HISTORY — DX: Essential tremor: G25.0

## 2014-04-25 NOTE — Patient Instructions (Signed)

## 2014-04-25 NOTE — Progress Notes (Signed)
Reason for visit: Seizures  [name] is an 81 y.o. female  History of present illness:  [name] is a 81 year old right-handed white female with a history of seizures that have been under good control. The patient has not had a seizure episode in several years, the last event occurred in 2011. The patient has reported some problems with gait instability, and she has undergone physical therapy with good improvement. She is no longer using a cane for ambulation. She did fall 2 weeks ago, with a tendency to lean backwards. She did not sustain any injury. She also reports some onset of problems with memory over the last 2 or 3 months. She has word finding problems and difficulty remembering recent events. She also has had problems with tremors of upper extremities, she denies any family history of tremor. This affects her handwriting and her ability to use a computer. She also has restless leg syndrome, this is helped by Mirapex that she takes at night. She returns for an evaluation. The patient reports a tremor that affects both upper extremities that mainly impacts her handwriting and her ability to use the computer. The right and the left arms are affected equally.  Past Medical History  Diagnosis Date  . Restless legs syndrome (RLS) 09/12/2011  . Insomnia, unspecified 09/12/2011  . Acute upper respiratory infections of unspecified site 05/23/2011  . Acute bronchitis 05/23/2011  . Chronic airway obstruction, not elsewhere classified 05/23/2011  . Other and unspecified hyperlipidemia 01/31/2011  . Major depressive disorder, single episode, unspecified 01/31/2011  . Unspecified essential hypertension 01/31/2011  . Mitral valve disorders 01/31/2011  . External hemorrhoids without mention of complication 81/19/1478  . Other emphysema 01/31/2011  . Disturbance of salivary secretion 01/31/2011  . Pain in joint, site unspecified 01/31/2011  . Stiffness of joints, not elsewhere classified,  multiple sites 01/31/2011  . Lumbago 01/31/2011  . Senile osteoporosis 01/31/2011  . Other convulsions 01/31/2011  . Dizziness and giddiness 01/31/2011  . Spontaneous ecchymoses 01/31/2011  . Retinal detachment with retinal defect of right eye 2011    right eye twice  . Gait disorder 04/25/2014  . Memory disorder 04/25/2014  . Essential tremor 04/25/2014    Past Surgical History  Procedure Laterality Date  . Tonsillectomy  1941  . Abdominal hysterectomy  2000    TAH/BSO, omenectomy PSB resect, Stg IC cystadenofibroma  . Cholecystectomy  2005    Dr. Marlou Starks  . Elbow surgery Right 2008    broken   Dr. Apolonio Schneiders  . Rotator cuff repair Right 2012    Dr. Theda Sers  . Squamous cell carcinoma excision Bilateral 2012, 8/14    [place]ohns on legs   Dr. Sarajane Jews  . Retinal detachment surgery N/A     two    Family History  Problem Relation Age of Onset  . Heart disease Father     CHF  . Cancer Mother     breast  . Seizures Sister     Social history:  reports that she quit smoking about 27 years ago. She has never used smokeless tobacco. She reports that she does not drink alcohol or use illicit drugs.    Allergies  Allergen Reactions  . Tetracyclines & Related     Medications:  Current Outpatient Prescriptions on File Prior to Visit  Medication Sig Dispense Refill  . albuterol (PROVENTIL HFA;VENTOLIN HFA) 108 (90 BASE) MCG/ACT inhaler Inhale 2 puffs into the lungs every 6 (six) hours as needed for wheezing.    [name]  alclomethasone (ACLOVATE) 0.05 % cream     . aspirin 81 MG tablet Take 81 mg by mouth daily.    . cholecalciferol (VITAMIN D) 1000 UNITS tablet One daily for Vitamin D supplementation. 100 tablet 5  . escitalopram (LEXAPRO) 10 MG tablet Take 1/2 tablet daily to help depression 45 tablet 4  . lamoTRIgine (LAMICTAL) 150 MG tablet TAKE 1 TABLET TWICE DAILY AND TAKE 1/2 TABLET AT LUNCH. 225 tablet 1  . levETIRAcetam (KEPPRA) 500 MG tablet TAKE (1/2) TABLET THREE TIMES DAILY. 135  tablet 1  . pramipexole (MIRAPEX) 0.25 MG tablet TAKE 1 TABLET ONCE DAILY. 90 tablet 1  . simvastatin (ZOCOR) 10 MG tablet Take 1 tablet (10 mg total) by mouth at bedtime. 90 tablet 1  . tiotropium (SPIRIVA HANDIHALER) 18 MCG inhalation capsule Inhale contents of one capsule once daily for COPD 30 capsule 3   No current facility-administered medications on file prior to visit.    ROS:  Out of a complete 14 system review of symptoms, the patient complains only of the following symptoms, and all other reviewed systems are negative.  Activity change Shortness of breath Excessive eating Restless legs Memory loss, history of seizures  Blood pressure 138/78, pulse 86, height 5\' 5"  (1.651 m), weight 158 lb 12.8 oz (72.031 kg).  Physical Exam  General: The patient is alert and cooperative at the time of the examination.  Skin: No significant peripheral edema is noted.   Neurologic Exam  Mental status: The patient is oriented x 3. Mini-Mental status examination done today shows a total score 30/30.  Cranial nerves: Facial symmetry is present. Speech is normal, no aphasia or dysarthria is noted. Extraocular movements are full. Visual fields are full.  Motor: The patient has good strength in all 4 extremities.  Sensory examination: Soft touch sensation is symmetric on the face, arms, and legs.  Coordination: The patient has good finger-nose-finger and heel-to-shin bilaterally. Slight apraxia with the use of the lower extremities is noted. Mild tremors are noted with handwriting.  Gait and station: The patient has a normal gait, but the patient does have some slight instability with turns. Tandem gait is unsteady. Romberg is negative. No drift is seen.  Reflexes: Deep tendon reflexes are symmetric.   MRI brain 12/08/12:  IMPRESSION:  Abnormal MRI brain (without) demonstrating: 1. Moderate periventricular and subcortical and pontine chronic small vessel ischemic disease. No acute  findings. 2. Compared to prior MRI on 12/19/08, there has been slight progression of chronic small vessel ischemic disease.   Assessment/Plan:  1. History of seizures under good control  2. Gait disorder  3. Restless leg syndrome  4. Memory disturbance  5. Essential tremor  The patient will continue the Lamictal. She had blood levels drawn last summer that were therapeutic. Prior MRI of the brain has shown a moderate level of small vessel ischemic changes that also includes the brainstem. This may be leading to her gait instability issues. She will continue her gait training by involvement in a balance class. She will have blood work done today to evaluate the memory issues. We may consider medications for memory in the future. We will not start medications for the central tremor this time. She will follow-up in 6 months. We will follow the memory problems.  Jill Alexanders MD 04/25/2014 8:03 PM  [name] [address] [place], [place] [address]  Phone [phone] Fax [phone]

## 2014-04-26 LAB — SPECIMEN STATUS REPORT

## 2014-04-28 LAB — COPPER, SERUM: Copper: 118 ug/dL (ref 72–166)

## 2014-04-28 LAB — VITAMIN B12: Vitamin B-12: 425 pg/mL (ref 211–946)

## 2014-04-28 LAB — RPR QUALITATIVE: RPR: NONREACTIVE

## 2014-04-28 LAB — TSH: TSH: 1.86 u[IU]/mL (ref 0.450–4.500)

## 2014-05-23 ENCOUNTER — Non-Acute Institutional Stay: Payer: Medicare Other | Admitting: Nurse Practitioner

## 2014-05-23 DIAGNOSIS — G40909 Epilepsy, unspecified, not intractable, without status epilepticus: Secondary | ICD-10-CM

## 2014-05-23 DIAGNOSIS — G2581 Restless legs syndrome: Secondary | ICD-10-CM

## 2014-05-23 DIAGNOSIS — F324 Major depressive disorder, single episode, in partial remission: Secondary | ICD-10-CM

## 2014-05-23 DIAGNOSIS — M79661 Pain in right lower leg: Secondary | ICD-10-CM | POA: Insufficient documentation

## 2014-05-23 DIAGNOSIS — W19XXXA Unspecified fall, initial encounter: Secondary | ICD-10-CM | POA: Insufficient documentation

## 2014-05-23 DIAGNOSIS — R609 Edema, unspecified: Secondary | ICD-10-CM

## 2014-05-23 DIAGNOSIS — I10 Essential (primary) hypertension: Secondary | ICD-10-CM

## 2014-05-23 DIAGNOSIS — W19XXXD Unspecified fall, subsequent encounter: Secondary | ICD-10-CM

## 2014-05-23 DIAGNOSIS — Y92009 Unspecified place in unspecified non-institutional (private) residence as the place of occurrence of the external cause: Secondary | ICD-10-CM | POA: Insufficient documentation

## 2014-05-23 DIAGNOSIS — R269 Unspecified abnormalities of gait and mobility: Secondary | ICD-10-CM

## 2014-05-23 DIAGNOSIS — J449 Chronic obstructive pulmonary disease, unspecified: Secondary | ICD-10-CM

## 2014-05-23 NOTE — Assessment & Plan Note (Signed)
Levetiracetam was added in addition to Lamictal. Seizure free since last seen.

## 2014-05-23 NOTE — Assessment & Plan Note (Signed)
Stable on Spiriva,

## 2014-05-23 NOTE — Assessment & Plan Note (Signed)
Stable on Lexapro 10mg 

## 2014-05-23 NOTE — Progress Notes (Signed)
Patient ID: [name], female   DOB: 1932-12-22, 81 y.o.   MRN: [id number]     Allergies  Allergen Reactions  . Tetracyclines & Related     Chief Complaint  Patient presents with  . Medical Management of Chronic Issues  . Acute Visit    fall x2, right lower leg pain.     HPI: Patient is a 81 y.o. female seen in the clinic at [facility] today for right lower leg pain sustained from falls x2 2 weeks ago and chronic medical conditions.  Problem List Items Addressed This Visit    Seizure disorder    Levetiracetam was added in addition to Lamictal. Seizure free since last seen.         Restless legs syndrome (RLS)    Onset was after she is off Phenobarbital--currently controlled on Mirapex         Pain of right lower leg - Primary    Mid to lower right lower leg sustained from falling about 2 weeks ago. Now have to use walker to ambulate with pain, mild warmth noted, no significant swelling or redness. Negative Homans sign. Will X-ray R knee/tibia/fibula 3 views. WBAT for now. OTC analgesics.        Major depressive disorder, single episode    Stable on Lexapro 10mg        Gait disorder (Chronic)    Worse since the fall x2 and resultant of right lower leg pain about 2 weeks ago. Ambulates with walker now      Fall    Falls x 2 two weeks ago-mechanical falls-resulted in the right lower leg pain with weight bearing and pain in the area 2-3 inches below the right knee. Mild swelling and warmth noted. Have to use walker for ambulation now since the fall. No focal neurological deficits noted. Denied chest pain, palpitation, dizziness, focal weakness associated with fall. Denied dysuria, fever, chills, or cough today.       Essential hypertension    Controlled.       Edema    Not apparent except the right mid of the lower leg where she c/o pain sustained from falling about 2 weeks with mild warmth in the area      COPD (chronic obstructive pulmonary disease)      Stable on Spiriva,           Review of Systems:  Review of Systems  Constitutional: Negative for fever, chills, weight loss, malaise/fatigue and diaphoresis.  HENT: Positive for hearing loss. Negative for congestion, ear discharge, ear pain, nosebleeds, sore throat and tinnitus.   Eyes: Negative for blurred vision, double vision, photophobia, pain, discharge and redness.  Respiratory: Positive for cough. Negative for hemoptysis, sputum production, shortness of breath, wheezing and stridor.   Cardiovascular: Positive for leg swelling. Negative for chest pain, palpitations, orthopnea and PND.  Gastrointestinal: Negative for heartburn, nausea, vomiting, abdominal pain, diarrhea, constipation, blood in stool and melena.  Genitourinary: Positive for frequency. Negative for dysuria, urgency, hematuria and flank pain.  Musculoskeletal: Positive for joint pain and falls. Negative for myalgias, back pain and neck pain.       Ambulates with walker since falls about 2 weeks ago which resulted in right lower leg pain.   Skin: Negative for itching and rash.       Pigmented BLE. Middle of the right shin mild warmth and swelling and pain with weight bearing.   Neurological: Positive for tremors. Negative for dizziness, tingling, sensory change, speech  change, focal weakness, seizures, loss of consciousness, weakness and headaches.  Endo/Heme/Allergies: Negative for environmental allergies and polydipsia. Does not bruise/bleed easily.  Psychiatric/Behavioral: Positive for depression and memory loss. Negative for suicidal ideas and substance abuse. The patient is nervous/anxious and has insomnia.      Past Medical History  Diagnosis Date  . Restless legs syndrome (RLS) 09/12/2011  . Insomnia, unspecified 09/12/2011  . Acute upper respiratory infections of unspecified site 05/23/2011  . Acute bronchitis 05/23/2011  . Chronic airway obstruction, not elsewhere classified 05/23/2011  . Other and unspecified  hyperlipidemia 01/31/2011  . Major depressive disorder, single episode, unspecified 01/31/2011  . Unspecified essential hypertension 01/31/2011  . Mitral valve disorders 01/31/2011  . External hemorrhoids without mention of complication 34/35/6861  . Other emphysema 01/31/2011  . Disturbance of salivary secretion 01/31/2011  . Pain in joint, site unspecified 01/31/2011  . Stiffness of joints, not elsewhere classified, multiple sites 01/31/2011  . Lumbago 01/31/2011  . Senile osteoporosis 01/31/2011  . Other convulsions 01/31/2011  . Dizziness and giddiness 01/31/2011  . Spontaneous ecchymoses 01/31/2011  . Retinal detachment with retinal defect of right eye 2011    right eye twice  . Gait disorder 04/25/2014  . Memory disorder 04/25/2014  . Essential tremor 04/25/2014   Past Surgical History  Procedure Laterality Date  . Tonsillectomy  1941  . Abdominal hysterectomy  2000    TAH/BSO, omenectomy PSB resect, Stg IC cystadenofibroma  . Cholecystectomy  2005    Dr. Marlou Starks  . Elbow surgery Right 2008    broken   Dr. Apolonio Schneiders  . Rotator cuff repair Right 2012    Dr. Theda Sers  . Squamous cell carcinoma excision Bilateral 2012, 8/14    [place] on legs   Dr. Sarajane Jews  . Retinal detachment surgery N/A     two   Social History:   reports that she quit smoking about 27 years ago. She has never used smokeless tobacco. She reports that she does not drink alcohol or use illicit drugs.  Family History  Problem Relation Age of Onset  . Heart disease Father     CHF  . Cancer Mother     breast  . Seizures Sister     Medications: Patient's Medications  New Prescriptions   No medications on file  Previous Medications   ALBUTEROL (PROVENTIL HFA;VENTOLIN HFA) 108 (90 BASE) MCG/ACT INHALER    Inhale 2 puffs into the lungs every 6 (six) hours as needed for wheezing.   ALCLOMETHASONE (ACLOVATE) 0.05 % CREAM       ASPIRIN 81 MG TABLET    Take 81 mg by mouth daily.   CHOLECALCIFEROL (VITAMIN D)  1000 UNITS TABLET    One daily for Vitamin D supplementation.   ESCITALOPRAM (LEXAPRO) 10 MG TABLET    Take 1/2 tablet daily to help depression   LAMOTRIGINE (LAMICTAL) 150 MG TABLET    TAKE 1 TABLET TWICE DAILY AND TAKE 1/2 TABLET AT LUNCH.   LEVETIRACETAM (KEPPRA) 500 MG TABLET    TAKE (1/2) TABLET THREE TIMES DAILY.   PRAMIPEXOLE (MIRAPEX) 0.25 MG TABLET    TAKE 1 TABLET ONCE DAILY.   SIMVASTATIN (ZOCOR) 10 MG TABLET    Take 1 tablet (10 mg total) by mouth at bedtime.   TIOTROPIUM (SPIRIVA HANDIHALER) 18 MCG INHALATION CAPSULE    Inhale contents of one capsule once daily for COPD  Modified Medications   No medications on file  Discontinued Medications   No medications on file  Physical Exam: Physical Exam  Constitutional: She is oriented to person, place, and time. She appears well-developed and well-nourished. No distress.  HENT:  Head: Normocephalic and atraumatic.  Right Ear: External ear normal.  Left Ear: External ear normal.  Nose: Nose normal.  Mouth/Throat: Oropharynx is clear and moist. No oropharyngeal exudate.  Eyes: Conjunctivae and EOM are normal. Pupils are equal, round, and reactive to light. Right eye exhibits no discharge. Left eye exhibits no discharge. No scleral icterus.  Neck: Normal range of motion. Neck supple. No JVD present. No tracheal deviation present. No thyromegaly present.  Cardiovascular: Normal rate, regular rhythm and normal heart sounds.   No murmur heard. Pulmonary/Chest: Effort normal and breath sounds normal. No respiratory distress. She has no wheezes. She has no rales. She exhibits no tenderness.  Abdominal: Soft. Bowel sounds are normal. She exhibits no distension. There is no tenderness. There is no rebound.  Musculoskeletal: Normal range of motion. She exhibits edema (RLE) and tenderness.  Trace edema right lower leg.  Walking with walker since falls 2 weeks ago.   Lymphadenopathy:    She has no cervical adenopathy.  Neurological: She  is alert and oriented to person, place, and time. She has normal reflexes. No cranial nerve deficit. She exhibits normal muscle tone. Coordination normal.  Skin: Skin is warm and dry. No rash noted. She is not diaphoretic. No erythema (right lower leg peri-surgical incision. ).  Pigmented BLE. Middle of the right shin mild warmth and swelling and pain with weight bearing.    Psychiatric: She has a normal mood and affect. Her behavior is normal. Judgment and thought content normal.  No depressive incident. 6CTD dementia score-0 03/24/14.     Filed Vitals:   05/23/14 1524  Pulse: 82  Resp: 16          Assessment/Plan Pain of right lower leg Mid to lower right lower leg sustained from falling about 2 weeks ago. Now have to use walker to ambulate with pain, mild warmth noted, no significant swelling or redness. Negative Homans sign. Will X-ray R knee/tibia/fibula 3 views. WBAT for now. OTC analgesics.     Gait disorder Worse since the fall x2 and resultant of right lower leg pain about 2 weeks ago. Ambulates with walker now   Restless legs syndrome (RLS) Onset was after she is off Phenobarbital--currently controlled on Mirapex      COPD (chronic obstructive pulmonary disease) Stable on Spiriva,     Essential hypertension Controlled.    Major depressive disorder, single episode Stable on Lexapro 10mg     Edema Not apparent except the right mid of the lower leg where she c/o pain sustained from falling about 2 weeks with mild warmth in the area   Seizure disorder Levetiracetam was added in addition to Lamictal. Seizure free since last seen.      Fall Falls x 2 two weeks ago-mechanical falls-resulted in the right lower leg pain with weight bearing and pain in the area 2-3 inches below the right knee. Mild swelling and warmth noted. Have to use walker for ambulation now since the fall. No focal neurological deficits noted. Denied chest pain, palpitation, dizziness,  focal weakness associated with fall. Denied dysuria, fever, chills, or cough today.      Family/ Staff Communication: observe  Goals of Care:IL  Labs/tests ordered: X-ray R knee/tibia/fibula 3 views to r/o fx.

## 2014-05-23 NOTE — Assessment & Plan Note (Addendum)
Mid to lower right lower leg sustained from falling about 2 weeks ago. Now have to use walker to ambulate with pain, mild warmth noted, no significant swelling or redness. Negative Homans sign. Will X-ray R knee/tibia/fibula 3 views. WBAT for now. OTC analgesics.

## 2014-05-23 NOTE — Assessment & Plan Note (Signed)
Worse since the fall x2 and resultant of right lower leg pain about 2 weeks ago. Ambulates with walker now

## 2014-05-23 NOTE — Assessment & Plan Note (Signed)
Falls x 2 two weeks ago-mechanical falls-resulted in the right lower leg pain with weight bearing and pain in the area 2-3 inches below the right knee. Mild swelling and warmth noted. Have to use walker for ambulation now since the fall. No focal neurological deficits noted. Denied chest pain, palpitation, dizziness, focal weakness associated with fall. Denied dysuria, fever, chills, or cough today.

## 2014-05-23 NOTE — Assessment & Plan Note (Signed)
Not apparent except the right mid of the lower leg where she c/o pain sustained from falling about 2 weeks with mild warmth in the area

## 2014-05-23 NOTE — Assessment & Plan Note (Signed)
Onset was after she is off Phenobarbital--currently controlled on Mirapex

## 2014-05-23 NOTE — Assessment & Plan Note (Signed)
Controlled.  

## 2014-05-26 ENCOUNTER — Ambulatory Visit
Admission: RE | Admit: 2014-05-26 | Discharge: 2014-05-26 | Disposition: A | Discharge status: Home / Self Care | Payer: PRIVATE HEALTH INSURANCE | Source: Ambulatory Visit | Attending: Nurse Practitioner | Admitting: Nurse Practitioner

## 2014-05-26 ENCOUNTER — Other Ambulatory Visit: Payer: Self-pay | Admitting: Nurse Practitioner

## 2014-05-26 DIAGNOSIS — W19XXXA Unspecified fall, initial encounter: Secondary | ICD-10-CM

## 2014-06-10 ENCOUNTER — Ambulatory Visit (INDEPENDENT_AMBULATORY_CARE_PROVIDER_SITE_OTHER): Payer: Medicare Other | Admitting: Nurse Practitioner

## 2014-06-10 ENCOUNTER — Encounter: Payer: Self-pay | Admitting: Nurse Practitioner

## 2014-06-10 VITALS — BP 120/76 | HR 64 | Ht 65.0 in | Wt 162.0 lb

## 2014-06-10 DIAGNOSIS — Z01419 Encounter for gynecological examination (general) (routine) without abnormal findings: Secondary | ICD-10-CM | POA: Diagnosis not present

## 2014-06-10 DIAGNOSIS — E559 Vitamin D deficiency, unspecified: Secondary | ICD-10-CM

## 2014-06-10 DIAGNOSIS — Z8543 Personal history of malignant neoplasm of ovary: Secondary | ICD-10-CM

## 2014-06-10 NOTE — Patient Instructions (Addendum)

## 2014-06-10 NOTE — Progress Notes (Signed)
Patient ID: [name], female   DOB: 05/18/1932, 81 y.o.   MRN: [id number] 81 y.o. G0P0 Divorced  Caucasian Fe here for annual exam.  She has had several recent [facility] the [facility].  Injury to right leg and back.  No fracture.  Feels well otherwise.  COPD is stable.  Patient's last menstrual period was 02/16/1975.          Sexually active: No.  The current method of family planning is status post hysterectomy.    Exercising: Yes.    steps Smoker:  no  Health Maintenance: Pap:  05/01/11, negative (TAH/BSO) MMG:  05/19/13, Bi-Rads 2:  Negative  Colonoscopy:  05/2013, polyp x 2, no repeat due to age BMD:   09/23/12, T-Score; -2.6Rad/-2.5R/-2.1L TDaP:  2013 Shingles: 04/08/2008 Labs:  PCP   reports that she quit smoking about 27 years ago. She has never used smokeless tobacco. She reports that she does not drink alcohol or use illicit drugs.  Past Medical History  Diagnosis Date  . Restless legs syndrome (RLS) 09/12/2011  . Insomnia, unspecified 09/12/2011  . Acute upper respiratory infections of unspecified site 05/23/2011  . Acute bronchitis 05/23/2011  . Chronic airway obstruction, not elsewhere classified 05/23/2011  . Other and unspecified hyperlipidemia 01/31/2011  . Major depressive disorder, single episode, unspecified 01/31/2011  . Unspecified essential hypertension 01/31/2011  . Mitral valve disorders 01/31/2011  . External hemorrhoids without mention of complication 29/79/8921  . Other emphysema 01/31/2011  . Disturbance of salivary secretion 01/31/2011  . Pain in joint, site unspecified 01/31/2011  . Stiffness of joints, not elsewhere classified, multiple sites 01/31/2011  . Lumbago 01/31/2011  . Senile osteoporosis 01/31/2011  . Other convulsions 01/31/2011  . Dizziness and giddiness 01/31/2011  . Spontaneous ecchymoses 01/31/2011  . Retinal detachment with retinal defect of right eye 2011    right eye twice  . Gait disorder 04/25/2014  . Memory disorder 04/25/2014  .  Essential tremor 04/25/2014    Past Surgical History  Procedure Laterality Date  . Tonsillectomy  1941  . Abdominal hysterectomy  06/21/2003    TAH/BSO, omenectomy PSB resect, Stg IC cystadenofibroma  . Cholecystectomy  2005    Dr. Marlou Starks  . Elbow surgery Right 2008    broken   Dr. Apolonio Schneiders  . Rotator cuff repair Right 2012    Dr. Theda Sers  . Squamous cell carcinoma excision Bilateral 2012, 8/14    Mohns on legs   Dr. Sarajane Jews  . Retinal detachment surgery N/A     two    Current Outpatient Prescriptions  Medication Sig Dispense Refill  . albuterol (PROVENTIL HFA;VENTOLIN HFA) 108 (90 BASE) MCG/ACT inhaler Inhale 2 puffs into the lungs every 6 (six) hours as needed for wheezing.    [name] alclomethasone (ACLOVATE) 0.05 % cream     . aspirin 81 MG tablet Take 81 mg by mouth daily.    . cholecalciferol (VITAMIN D) 1000 UNITS tablet One daily for Vitamin D supplementation. 100 tablet 5  . escitalopram (LEXAPRO) 10 MG tablet Take 1/2 tablet daily to help depression 45 tablet 4  . lamoTRIgine (LAMICTAL) 150 MG tablet TAKE 1 TABLET TWICE DAILY AND TAKE 1/2 TABLET AT LUNCH. 225 tablet 1  . levETIRAcetam (KEPPRA) 500 MG tablet TAKE (1/2) TABLET THREE TIMES DAILY. 135 tablet 1  . pramipexole (MIRAPEX) 0.25 MG tablet TAKE 1 TABLET ONCE DAILY. 90 tablet 1  . simvastatin (ZOCOR) 10 MG tablet Take 1 tablet (10 mg total) by mouth at bedtime. Gem  tablet 1  . tiotropium (SPIRIVA HANDIHALER) 18 MCG inhalation capsule Inhale contents of one capsule once daily for COPD 30 capsule 3   No current facility-administered medications for this visit.    Family History  Problem Relation Age of Onset  . Heart disease Father     CHF  . Cancer Mother     breast  . Seizures Sister     ROS:  Pertinent items are noted in HPI.  Otherwise, a comprehensive ROS was negative.  Exam:   BP 120/76 mmHg  Pulse 64  Ht 5\' 5"  (1.651 m)  Wt 162 lb (73.483 kg)  BMI 26.96 kg/m2  LMP 02/16/1975 Height: 5\' 5"  (165.1 cm) Ht  Readings from Last 3 Encounters:  06/10/14 5\' 5"  (1.651 m)  04/25/14 5\' 5"  (1.651 m)  11/29/13 5\' 9"  (1.753 m)    General appearance: alert, cooperative and appears stated age Head: Normocephalic, without obvious abnormality, atraumatic Neck: no adenopathy, supple, symmetrical, trachea midline and thyroid normal to inspection and palpation Lungs: clear to auscultation bilaterally Breasts: normal appearance, no masses or tenderness Heart: regular rate and rhythm Abdomen: soft, non-tender; no masses,  no organomegaly Extremities: extremities normal, atraumatic, no cyanosis or edema Skin: Skin color, texture, turgor normal. No rashes or lesions Lymph nodes: Cervical, supraclavicular, and axillary nodes normal. No abnormal inguinal nodes palpated Neurologic: Grossly normal   Pelvic: External genitalia:  no lesions              Urethra:  normal appearing urethra with no masses, tenderness or lesions              Bartholin's and Skene's: normal                 Vagina: normal appearing vagina with normal color and discharge, no lesions              Cervix: absent              Pap taken: Yes.   Bimanual Exam:  Uterus:  uterus absent              Adnexa: no mass, fullness, tenderness               Rectovaginal: not examined               Anus:  Per request not examined  Chaperone present:  yes  A:  Well Woman with normal exam  S/P TAH/BSO w/ Omenctomy Stage IC OV cancer 06/2003 - yearly CA 125 History of Osteoporosis and scoliosis followed by PCP History of squamous cell skin cancers   P:   Reviewed health and wellness pertinent to exam  Pap smear taken today  Mammogram is due 2/16  Follow with labs - Vit D and CA 125  Counseled on breast self exam, mammography screening, adequate intake of calcium and vitamin D, diet and exercise return annually or prn  An After Visit Summary was printed and given to the patient.

## 2014-06-11 LAB — CA 125: CA 125: 42 U/mL — ABNORMAL HIGH (ref <35)

## 2014-06-11 LAB — VITAMIN D 25 HYDROXY (VIT D DEFICIENCY, FRACTURES): Vit D, 25-Hydroxy: 46 ng/mL (ref 30–100)

## 2014-06-13 ENCOUNTER — Telehealth: Payer: Self-pay | Admitting: Nurse Practitioner

## 2014-06-13 ENCOUNTER — Other Ambulatory Visit: Payer: Self-pay | Admitting: Internal Medicine

## 2014-06-13 ENCOUNTER — Telehealth: Payer: Self-pay | Admitting: Neurology

## 2014-06-13 LAB — IPS PAP SMEAR ONLY

## 2014-06-13 NOTE — Progress Notes (Signed)
Encounter reviewed by Dr. Brook Silva.  

## 2014-06-13 NOTE — Telephone Encounter (Signed)
Patient is returning call. Will be available for the next 45 minutes.

## 2014-06-13 NOTE — Telephone Encounter (Signed)
Patient is given results of Vit D level and CA 125.  She is told the CA 125 level is higher than anticipated.  I have discussed this with Dr. Quincy Simmonds and she needs to be seen at [facility] at [facility].  She is notified of her apt. 3/11 at 12:00 and to arrive at 11:30 am.  She has friends and / or a sister who can drive her there. She is aware that a My Chart message will be released to her tomorrow regarding the apt. time and information.

## 2014-06-14 ENCOUNTER — Encounter: Payer: Self-pay | Admitting: *Deleted

## 2014-06-14 LAB — HM MAMMOGRAPHY

## 2014-06-14 NOTE — Telephone Encounter (Signed)
Pt is calling to check on Rx for lamoTRIgine (LAMICTAL) 150 MG tablet and levETIRAcetam (KEPPRA) 500 MG tablet.  She states she will be out of medication in the morning.  Please advise.

## 2014-06-14 NOTE — Telephone Encounter (Signed)
The clinic is supposed to be doing refills, but since this was sent to me, I have completed the request.

## 2014-06-16 ENCOUNTER — Encounter: Payer: Self-pay | Admitting: Nurse Practitioner

## 2014-06-17 ENCOUNTER — Encounter: Payer: Self-pay | Admitting: Gynecology

## 2014-06-17 ENCOUNTER — Ambulatory Visit: Payer: PRIVATE HEALTH INSURANCE | Attending: Gynecology | Admitting: Gynecology

## 2014-06-17 VITALS — BP 161/98 | HR 81 | Temp 98.1°F | Resp 20 | Ht 65.0 in | Wt 162.0 lb

## 2014-06-17 DIAGNOSIS — J449 Chronic obstructive pulmonary disease, unspecified: Secondary | ICD-10-CM | POA: Insufficient documentation

## 2014-06-17 DIAGNOSIS — M810 Age-related osteoporosis without current pathological fracture: Secondary | ICD-10-CM | POA: Insufficient documentation

## 2014-06-17 DIAGNOSIS — Z79899 Other long term (current) drug therapy: Secondary | ICD-10-CM | POA: Insufficient documentation

## 2014-06-17 DIAGNOSIS — D495 Neoplasm of unspecified behavior of other genitourinary organs: Secondary | ICD-10-CM | POA: Diagnosis present

## 2014-06-17 DIAGNOSIS — E785 Hyperlipidemia, unspecified: Secondary | ICD-10-CM | POA: Diagnosis not present

## 2014-06-17 DIAGNOSIS — Z7982 Long term (current) use of aspirin: Secondary | ICD-10-CM | POA: Insufficient documentation

## 2014-06-17 DIAGNOSIS — G2581 Restless legs syndrome: Secondary | ICD-10-CM | POA: Insufficient documentation

## 2014-06-17 DIAGNOSIS — I10 Essential (primary) hypertension: Secondary | ICD-10-CM | POA: Insufficient documentation

## 2014-06-17 DIAGNOSIS — D3910 Neoplasm of uncertain behavior of unspecified ovary: Secondary | ICD-10-CM

## 2014-06-17 DIAGNOSIS — Z881 Allergy status to other antibiotic agents status: Secondary | ICD-10-CM | POA: Insufficient documentation

## 2014-06-17 DIAGNOSIS — R971 Elevated cancer antigen 125 [CA 125]: Secondary | ICD-10-CM | POA: Insufficient documentation

## 2014-06-17 NOTE — Patient Instructions (Signed)
We'll obtain a CT scan of the chest abdomen and pelvis for further evaluation.

## 2014-06-17 NOTE — Progress Notes (Signed)
Consult Note: Gyn-Onc   [name] 81 y.o. female  Chief Complaint  Patient presents with  . 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 I explained the patient and her family that there are a number of reasons that CA-125 may become elevated and do not necessarily represent a malignancy. I think the most straightforward way to evaluate further would be to obtain a CT scan of the chest abdomen and pelvis.  Plan: A CT scan of the chest abdomen and pelvis was scheduled for next Thursday. Pending those results we will contact the patient and arrange any further evaluation or follow-up.   HPI: 81 year old white female seen in consultation at the request of Kem Boroughs FNP regarding management of a rising CA-125 value.  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 There was an implant on the small bowel which was resected which was entirely benign. Subsequently over the past 10 years, the patient has done well with no evidence recurrent disease. She had a CA-125 value last year of 25 units per mL and this year returned as 42 units per mL.  The patient denies any GI or GU symptoms. She specifically denies any bloating or abdominal pain or pressure. She denies any recent infections. She also denies any acute medical problems.  Review of Systems:10 point review of systems is negative except as noted in interval history.   Vitals: Blood pressure 161/98, pulse 81, temperature 98.1 F (36.7 C), temperature source Oral, resp. rate 20, height 5\' 5"  (1.651 m), weight 162 lb (73.483 kg), last menstrual period 02/16/1975.  Physical Exam: General : The patient is a healthy woman in no acute distress.  HEENT: normocephalic, extraoccular movements normal; neck is supple without  thyromegally  Lynphnodes: Supraclavicular and inguinal nodes not enlarged  Abdomen: Soft, non-tender, no ascites, no organomegally, no masses, no hernias, midline incision is well-healed.  Pelvic:  EGBUS: Normal female  Vagina: Normal, no lesions  Urethra and Bladder: Normal, non-tender  Cervix: Surgically absent  Uterus: Surgically absent  Bi-manual examination: Non-tender; no adenxal masses or nodularity  Rectal: normal sphincter tone, no masses, no blood  Lower extremities: No edema or varicosities. Normal range of motion      Allergies  Allergen Reactions  . Tetracyclines & Related     Past Medical History  Diagnosis Date  . Restless legs syndrome (RLS) 09/12/2011  . Insomnia, unspecified 09/12/2011  . Acute upper respiratory infections of unspecified site 05/23/2011  . Acute bronchitis 05/23/2011  . Chronic airway obstruction, not elsewhere classified 05/23/2011  . Other and unspecified hyperlipidemia 01/31/2011  . Major depressive disorder, single episode, unspecified 01/31/2011  . Unspecified essential hypertension 01/31/2011  . Mitral valve disorders 01/31/2011  . External hemorrhoids without mention of complication 12/87/8676  . Other emphysema 01/31/2011  . Disturbance of salivary secretion 01/31/2011  . Pain in joint, site unspecified 01/31/2011  . Stiffness of joints, not elsewhere classified, multiple sites 01/31/2011  . Lumbago 01/31/2011  . Senile osteoporosis 01/31/2011  . Other convulsions 01/31/2011  . Dizziness and giddiness 01/31/2011  . Spontaneous ecchymoses 01/31/2011  . Retinal detachment with retinal defect of right eye 2011    right eye twice  . Gait disorder 04/25/2014  . Memory disorder 04/25/2014  . Essential tremor 04/25/2014  Past Surgical History  Procedure Laterality Date  . Tonsillectomy  1941  . Abdominal hysterectomy  06/21/2003    TAH/BSO, omenectomy PSB resect, Stg IC cystadenofibroma  . Cholecystectomy  2005    Dr. Marlou Starks  . Elbow  surgery Right 2008    broken   Dr. Apolonio Schneiders  . Rotator cuff repair Right 2012    Dr. Theda Sers  . Squamous cell carcinoma excision Bilateral 2012, 8/14    Mohns on legs   Dr. Sarajane Jews  . Retinal detachment surgery N/A     two    Current Outpatient Prescriptions  Medication Sig Dispense Refill  . albuterol (PROVENTIL HFA;VENTOLIN HFA) 108 (90 BASE) MCG/ACT inhaler Inhale 2 puffs into the lungs every 6 (six) hours as needed for wheezing.    [name] alclomethasone (ACLOVATE) 0.05 % cream Apply 1 application topically as needed.     [name] aspirin 81 MG tablet Take 81 mg by mouth daily.    . cholecalciferol (VITAMIN D) 1000 UNITS tablet One daily for Vitamin D supplementation. 100 tablet 5  . escitalopram (LEXAPRO) 10 MG tablet Take 1/2 tablet daily to help depression 45 tablet 4  . lamoTRIgine (LAMICTAL) 150 MG tablet TAKE 1 TABLET TWICE DAILY AND TAKE 1/2 TABLET AT LUNCH. 225 tablet 1  . levETIRAcetam (KEPPRA) 500 MG tablet TAKE (1/2) TABLET THREE TIMES DAILY. 135 tablet 1  . pramipexole (MIRAPEX) 0.25 MG tablet TAKE 1 TABLET ONCE DAILY. 90 tablet 1  . simvastatin (ZOCOR) 10 MG tablet Take 1 tablet (10 mg total) by mouth at bedtime. 90 tablet 1  . SPIRIVA HANDIHALER 18 MCG inhalation capsule INHALE CONTENTS OF ONE CAPSULE ONCE DAILY FOR COPD. 30 capsule 5   No current facility-administered medications for this visit.    History   Social History  . Marital Status: Divorced    Spouse Name: N/A  . Number of Children: 0  . Years of Education: N/A   Occupational History  . retired Tourist information centre manager for [employer]    Social History Main Topics  . Smoking status: Former Smoker    Quit date: 04/09/1987  . Smokeless tobacco: Never Used     Comment: quit in 1990  . Alcohol Use: No  . Drug Use: No  . Sexual Activity: No   Other Topics Concern  . Not on file   Social History Narrative   Lives at [facility]   No children   Divorced   Exercise climbs steps 4 flights daily PT for  [employer]   Alcohol none   Stopped smoking 1989       Family History  Problem Relation Age of Onset  . Heart disease Father     CHF  . Cancer Mother     breast  . Seizures Sister       Alvino Chapel, MD 06/17/2014, 1:06 PM       Consult Note: Gyn-Onc   [name] 81 y.o. female  Chief Complaint  Patient presents with  . ovarian tumor of borderline malignancy    Assessment :  Plan:  Interval History:   HPI:  Review of Systems:10 point review of systems is negative except as noted in interval history.   Vitals: Blood pressure 161/98, pulse 81, temperature 98.1 F (36.7 C), temperature source Oral, resp. rate 20, height 5\' 5"  (1.651 m), weight 162 lb (73.483 kg), last menstrual period 02/16/1975.  Physical Exam: General : The patient is a healthy woman in no acute distress.  HEENT: normocephalic, extraoccular movements  normal; neck is supple without thyromegally  Lynphnodes: Supraclavicular and inguinal nodes not enlarged  Abdomen: Soft, non-tender, no ascites, no organomegally, no masses, no hernias  Pelvic:  EGBUS: Normal female  Vagina: Normal, no lesions  Urethra and Bladder: Normal, non-tender  Cervix: Surgically absent  Uterus: Surgically absent  Bi-manual examination: Non-tender; no adenxal masses or nodularity  Rectal: normal sphincter tone, no masses, no blood  Lower extremities: No edema or varicosities. Normal range of motion      Allergies  Allergen Reactions  . Tetracyclines & Related     Past Medical History  Diagnosis Date  . Restless legs syndrome (RLS) 09/12/2011  . Insomnia, unspecified 09/12/2011  . Acute upper respiratory infections of unspecified site 05/23/2011  . Acute bronchitis 05/23/2011  . Chronic airway obstruction, not elsewhere classified 05/23/2011  . Other and unspecified hyperlipidemia 01/31/2011  . Major depressive disorder, single episode, unspecified 01/31/2011  . Unspecified essential hypertension  01/31/2011  . Mitral valve disorders 01/31/2011  . External hemorrhoids without mention of complication 92/02/9416  . Other emphysema 01/31/2011  . Disturbance of salivary secretion 01/31/2011  . Pain in joint, site unspecified 01/31/2011  . Stiffness of joints, not elsewhere classified, multiple sites 01/31/2011  . Lumbago 01/31/2011  . Senile osteoporosis 01/31/2011  . Other convulsions 01/31/2011  . Dizziness and giddiness 01/31/2011  . Spontaneous ecchymoses 01/31/2011  . Retinal detachment with retinal defect of right eye 2011    right eye twice  . Gait disorder 04/25/2014  . Memory disorder 04/25/2014  . Essential tremor 04/25/2014    Past Surgical History  Procedure Laterality Date  . Tonsillectomy  1941  . Abdominal hysterectomy  06/21/2003    TAH/BSO, omenectomy PSB resect, Stg IC cystadenofibroma  . Cholecystectomy  2005    Dr. Marlou Starks  . Elbow surgery Right 2008    broken   Dr. Apolonio Schneiders  . Rotator cuff repair Right 2012    Dr. Theda Sers  . Squamous cell carcinoma excision Bilateral 2012, 8/14    Mohns on legs   Dr. Sarajane Jews  . Retinal detachment surgery N/A     two    Current Outpatient Prescriptions  Medication Sig Dispense Refill  . albuterol (PROVENTIL HFA;VENTOLIN HFA) 108 (90 BASE) MCG/ACT inhaler Inhale 2 puffs into the lungs every 6 (six) hours as needed for wheezing.    [name] alclomethasone (ACLOVATE) 0.05 % cream Apply 1 application topically as needed.     [name] aspirin 81 MG tablet Take 81 mg by mouth daily.    . cholecalciferol (VITAMIN D) 1000 UNITS tablet One daily for Vitamin D supplementation. 100 tablet 5  . escitalopram (LEXAPRO) 10 MG tablet Take 1/2 tablet daily to help depression 45 tablet 4  . lamoTRIgine (LAMICTAL) 150 MG tablet TAKE 1 TABLET TWICE DAILY AND TAKE 1/2 TABLET AT LUNCH. 225 tablet 1  . levETIRAcetam (KEPPRA) 500 MG tablet TAKE (1/2) TABLET THREE TIMES DAILY. 135 tablet 1  . pramipexole (MIRAPEX) 0.25 MG tablet TAKE 1 TABLET ONCE DAILY. 90  tablet 1  . simvastatin (ZOCOR) 10 MG tablet Take 1 tablet (10 mg total) by mouth at bedtime. 90 tablet 1  . SPIRIVA HANDIHALER 18 MCG inhalation capsule INHALE CONTENTS OF ONE CAPSULE ONCE DAILY FOR COPD. 30 capsule 5   No current facility-administered medications for this visit.    History   Social History  . Marital Status: Divorced    Spouse Name: N/A  . Number of Children: 0  . Years of Education: N/A  Occupational History  . retired Tourist information centre manager for [employer]    Social History Main Topics  . Smoking status: Former Smoker    Quit date: 04/09/1987  . Smokeless tobacco: Never Used     Comment: quit in 1990  . Alcohol Use: No  . Drug Use: No  . Sexual Activity: No   Other Topics Concern  . Not on file   Social History Narrative   [facility] at [facility]   No children   Divorced   Exercise climbs steps 4 flights daily PT for [employer]   Alcohol none   Stopped smoking 1989       Family History  Problem Relation Age of Onset  . Heart disease Father     CHF  . Cancer Mother     breast  . Seizures Sister       Alvino Chapel, MD 06/17/2014, 1:06 PM

## 2014-06-22 ENCOUNTER — Other Ambulatory Visit: Payer: PRIVATE HEALTH INSURANCE

## 2014-06-22 DIAGNOSIS — D3910 Neoplasm of uncertain behavior of unspecified ovary: Secondary | ICD-10-CM

## 2014-06-22 DIAGNOSIS — R971 Elevated cancer antigen 125 [CA 125]: Secondary | ICD-10-CM

## 2014-06-22 LAB — BASIC METABOLIC PANEL (CC13)
ANION GAP: 7 meq/L (ref 3–11)
BUN: 29.6 mg/dL — ABNORMAL HIGH (ref 7.0–26.0)
CO2: 28 meq/L (ref 22–29)
CREATININE: 0.8 mg/dL (ref 0.6–1.1)
Calcium: 9.7 mg/dL (ref 8.4–10.4)
Chloride: 108 mEq/L (ref 98–109)
EGFR: 68 mL/min/{1.73_m2} — AB (ref >90)
Glucose: 82 mg/dl (ref 70–140)
Potassium: 5 mEq/L (ref 3.5–5.1)
SODIUM: 143 meq/L (ref 136–145)

## 2014-06-23 ENCOUNTER — Encounter (HOSPITAL_COMMUNITY): Payer: Self-pay

## 2014-06-23 ENCOUNTER — Ambulatory Visit (HOSPITAL_COMMUNITY)
Admission: RE | Admit: 2014-06-23 | Discharge: 2014-06-23 | Disposition: A | Discharge status: Home / Self Care | Payer: Medicare Other | Source: Ambulatory Visit | Attending: Gynecologic Oncology | Admitting: Gynecologic Oncology

## 2014-06-23 DIAGNOSIS — R971 Elevated cancer antigen 125 [CA 125]: Secondary | ICD-10-CM

## 2014-06-23 DIAGNOSIS — Z8543 Personal history of malignant neoplasm of ovary: Secondary | ICD-10-CM | POA: Diagnosis not present

## 2014-06-23 DIAGNOSIS — D3910 Neoplasm of uncertain behavior of unspecified ovary: Secondary | ICD-10-CM

## 2014-06-23 DIAGNOSIS — Z90710 Acquired absence of both cervix and uterus: Secondary | ICD-10-CM | POA: Insufficient documentation

## 2014-06-23 DIAGNOSIS — R0602 Shortness of breath: Secondary | ICD-10-CM | POA: Insufficient documentation

## 2014-06-23 MED ORDER — IOHEXOL 300 MG/ML  SOLN
100.0000 mL | Freq: Once | INTRAMUSCULAR | Status: AC | PRN
  Administered 2014-06-23: 100 mL via INTRAVENOUS

## 2014-06-29 ENCOUNTER — Telehealth: Payer: Self-pay | Admitting: Nurse Practitioner

## 2014-06-29 NOTE — Telephone Encounter (Signed)
Per Joylene John, NP, patient referred to [facility] for evaluation of lung nodules. New appt given for 07/21/14 at 11:15, arrive at 11:00, with Dr. Lynford Citizen. Patient given office phone and location. She is informed that she may call their office periodically to check for cancellations and earlier apts if she would like. She verbalizes understanding and thanks for the call. Records sent per this RN.

## 2014-07-11 ENCOUNTER — Other Ambulatory Visit: Payer: Self-pay | Admitting: Internal Medicine

## 2014-07-21 ENCOUNTER — Institutional Professional Consult (permissible substitution): Payer: PRIVATE HEALTH INSURANCE | Admitting: Internal Medicine

## 2014-07-25 ENCOUNTER — Telehealth: Payer: Self-pay | Admitting: Internal Medicine

## 2014-07-25 ENCOUNTER — Encounter: Payer: Self-pay | Admitting: Internal Medicine

## 2014-07-25 ENCOUNTER — Ambulatory Visit (INDEPENDENT_AMBULATORY_CARE_PROVIDER_SITE_OTHER): Payer: Medicare Other | Admitting: Internal Medicine

## 2014-07-25 VITALS — BP 144/98 | HR 90 | Ht 65.0 in | Wt 164.0 lb

## 2014-07-25 DIAGNOSIS — Z87891 Personal history of nicotine dependence: Secondary | ICD-10-CM | POA: Insufficient documentation

## 2014-07-25 DIAGNOSIS — J431 Panlobular emphysema: Secondary | ICD-10-CM

## 2014-07-25 DIAGNOSIS — R918 Other nonspecific abnormal finding of lung field: Secondary | ICD-10-CM

## 2014-07-25 DIAGNOSIS — R0600 Dyspnea, unspecified: Secondary | ICD-10-CM

## 2014-07-25 DIAGNOSIS — R0689 Other abnormalities of breathing: Secondary | ICD-10-CM | POA: Insufficient documentation

## 2014-07-25 MED ORDER — FLUTICASONE FUROATE-VILANTEROL 100-25 MCG/INH IN AEPB: 1.0000 | INHALATION_SPRAY | Freq: Every day | RESPIRATORY_TRACT | Status: DC

## 2014-07-25 NOTE — Telephone Encounter (Signed)
Daneil Dan  cAn you find out please if we are still doing the INDIDx blood test for lung nodule and if still available free and they go to patient site to get blood?  Thanks  Dr. Brand Males, M.D., [facility].C.P Pulmonary and Critical Care Medicine Staff Physician [facility] Pager: [phone], If no answer or between  15:00h - 7:00h: call [phone]  07/25/2014 11:50 AM

## 2014-07-25 NOTE — Telephone Encounter (Signed)
I emailed Norton Blizzard regarding INDI blood test. Will await a reply.

## 2014-07-25 NOTE — Progress Notes (Signed)
Subjective:    Patient ID: [name], female    DOB: January 05, 1933, 81 y.o.   MRN: [id number] PCP GREEN, Viviann Spare, MD   HPI  IOV 07/25/2014   81 year old female accompanied by her 2 sisters who are 6 and 10 years apart. They're all childless. She is an ex-smoker. Quit in the 1990s. Problems   Dyspnea:: he has multi-year diagnosis of dyspnea with emphysema diagnosed over 15 years ago. She's been on Spiriva since then. Dyspnea has been stable but in the last several months is progressive. It is still mild but present on exertion and relieved by rest. Worsening started sometime before she took a fall and since then has been a little bit more sedentary. Also associated with worsening dyspnea is worsening aging related loss of mobility. She now walks with a cane for the last 1 year. She's moved from an apartment to an assisted living facility. There is no associated cough or wheezing.  Multiple lung nodules: She had a total abdominal hysterectomy and oophorectomy in 10 and years ago. Since then she is on observation. Apparently her CTA-125 is borderline elevated. Most recently there was a slight increase but still only borderline elevated. This resulted in investigative imaging. CT scan of the chest mid March 2016 showed multiple lung nodules bilaterally. Largest of this is a left lower lobe subpleural 1.5 cm nodule. Radiologist felt this to be of indeterminate for lung cancer or metastasis    has a past medical history of Restless legs syndrome (RLS) (09/12/2011); Insomnia, unspecified (09/12/2011); Acute upper respiratory infections of unspecified site (05/23/2011); Acute bronchitis (05/23/2011); Chronic airway obstruction, not elsewhere classified (05/23/2011); Other and unspecified hyperlipidemia (01/31/2011); Major depressive disorder, single episode, unspecified (01/31/2011); Unspecified essential hypertension (01/31/2011); Mitral valve disorders (01/31/2011); External hemorrhoids without mention of  complication (40/98/1191); Other emphysema (01/31/2011); Disturbance of salivary secretion (01/31/2011); Pain in joint, site unspecified (01/31/2011); Stiffness of joints, not elsewhere classified, multiple sites (01/31/2011); Lumbago (01/31/2011); Senile osteoporosis (01/31/2011); Other convulsions (01/31/2011); Dizziness and giddiness (01/31/2011); Spontaneous ecchymoses (01/31/2011); Retinal detachment with retinal defect of right eye (2011); Gait disorder (04/25/2014); Memory disorder (04/25/2014); and Essential tremor (04/25/2014).   reports that she quit smoking about 26 years ago. Her smoking use included Cigarettes. She has a 40 pack-year smoking history. She has never used smokeless tobacco.  Past Surgical History  Procedure Laterality Date  . Tonsillectomy  1941  . Abdominal hysterectomy  06/21/2003    TAH/BSO, omenectomy PSB resect, Stg IC cystadenofibroma  . Cholecystectomy  2005    Dr. Marlou Starks  . Elbow surgery Right 2008    broken   Dr. Apolonio Schneiders  . Rotator cuff repair Right 2012    Dr. Theda Sers  . Squamous cell carcinoma excision Bilateral 2012, 8/14    Mohns on legs   Dr. Sarajane Jews  . Retinal detachment surgery N/A     two    Allergies  Allergen Reactions  . Tetracyclines & Related     Immunization History  Administered Date(s) Administered  . Influenza Split 01/06/2014  . Influenza Whole 01/07/2012, 01/06/2013  . Pneumococcal Polysaccharide-23 04/08/2004  . Td 04/08/2002  . Zoster 04/08/2008    Family History  Problem Relation Age of Onset  . Heart disease Father     CHF  . Cancer Mother     breast  . Seizures Sister      Current outpatient prescriptions:  .  albuterol (PROVENTIL HFA;VENTOLIN HFA) 108 (90 BASE) MCG/ACT inhaler, Inhale 2 puffs into the lungs every 6 (  six) hours as needed for wheezing., Disp: , Rfl:  .  alclomethasone (ACLOVATE) 0.05 % cream, Apply 1 application topically as needed. , Disp: , Rfl:  .  aspirin 81 MG tablet, Take 81 mg by mouth daily.,  Disp: , Rfl:  .  cholecalciferol (VITAMIN D) 1000 UNITS tablet, One daily for Vitamin D supplementation., Disp: 100 tablet, Rfl: 5 .  lamoTRIgine (LAMICTAL) 150 MG tablet, TAKE 1 TABLET TWICE DAILY AND TAKE 1/2 TABLET AT LUNCH., Disp: 225 tablet, Rfl: 1 .  levETIRAcetam (KEPPRA) 500 MG tablet, TAKE (1/2) TABLET THREE TIMES DAILY., Disp: 135 tablet, Rfl: 1 .  pramipexole (MIRAPEX) 0.25 MG tablet, TAKE 1 TABLET ONCE DAILY., Disp: 90 tablet, Rfl: 1 .  simvastatin (ZOCOR) 10 MG tablet, TAKE 1 TABLET ONCE DAILY., Disp: 90 tablet, Rfl: 2 .  SPIRIVA HANDIHALER 18 MCG inhalation capsule, INHALE CONTENTS OF ONE CAPSULE ONCE DAILY FOR COPD., Disp: 30 capsule, Rfl: 5     Review of Systems  Constitutional: Negative for fever and unexpected weight change.  HENT: Positive for nosebleeds. Negative for congestion, dental problem, ear pain, postnasal drip, rhinorrhea, sinus pressure, sneezing, sore throat and trouble swallowing.   Eyes: Negative for redness and itching.  Respiratory: Positive for shortness of breath. Negative for cough, chest tightness and wheezing.   Cardiovascular: Positive for leg swelling. Negative for palpitations.  Gastrointestinal: Positive for nausea. Negative for vomiting.  Genitourinary: Negative for dysuria.  Musculoskeletal: Negative for joint swelling.  Skin: Negative for rash.  Neurological: Negative for headaches.  Hematological: Does not bruise/bleed easily.  Psychiatric/Behavioral: Negative for dysphoric mood. The patient is not nervous/anxious.        Objective:   Physical Exam  Constitutional: She is oriented to person, place, and time. She appears well-developed and well-nourished. No distress.  HENT:  Head: Normocephalic and atraumatic.  Right Ear: External ear normal.  Left Ear: External ear normal.  Mouth/Throat: Oropharynx is clear and moist. No oropharyngeal exudate.  Eyes: Conjunctivae and EOM are normal. Pupils are equal, round, and reactive to light.  Right eye exhibits no discharge. Left eye exhibits no discharge. No scleral icterus.  Neck: Normal range of motion. Neck supple. No JVD present. No tracheal deviation present. No thyromegaly present.  Cardiovascular: Normal rate, regular rhythm, normal heart sounds and intact distal pulses.  Exam reveals no gallop and no friction rub.   No murmur heard. Pulmonary/Chest: Effort normal and breath sounds normal. No respiratory distress. She has no wheezes. She has no rales. She exhibits no tenderness.  RLL crack;es +  Abdominal: Soft. Bowel sounds are normal. She exhibits no distension and no mass. There is no tenderness. There is no rebound and no guarding.  Musculoskeletal: Normal range of motion. She exhibits no edema or tenderness.  Significant kyphoscoliosis + Uses cane x 1 year  Lymphadenopathy:    She has no cervical adenopathy.  Neurological: She is alert and oriented to person, place, and time. She has normal reflexes. No cranial nerve deficit. She exhibits normal muscle tone. Coordination normal.  Skin: Skin is warm and dry. No rash noted. She is not diaphoretic. No erythema. No pallor.  Psychiatric: She has a normal mood and affect. Her behavior is normal. Judgment and thought content normal.  Vitals reviewed.    Filed Vitals:   07/25/14 1110  BP: 144/98  Pulse: 90  Height: 5\' 5"  (1.651 m)  Weight: 164 lb (74.39 kg)  SpO2: 94%        Assessment & Plan:  ICD-9-CM ICD-10-CM   1. Panlobular emphysema 492.8 J43.1   2. Dyspnea and respiratory abnormality 786.09 R06.00     R06.89   3. Multiple lung nodules on CT 793.19 R91.8   4. Smoking history V15.82 Z72.0     Dyspnea Probably worse due to deconditioning Plan Continue spiriva 1 puff daily Add BREO 1 puff daily at night  - can be expensive; take sample for now  - call in few weeks to see if it helps and we can try to get it insurance authorized  Lung nodules - Largest left lower lobe subpleural 1.5 cm nodule  probably indeterminate probability. They overall peripheral very conservative approach   - will investigate on INDIDx blood test for lung nodule lung cancer probability - do CT chest wo contrast mid-June 2016 - the 3 month followup CT Chest  Followup - CT chest mid-June 2016  - mid-June 2016 to see me or my NP  Dr. Brand Males, M.D., [facility].C.P Pulmonary and Critical Care Medicine Staff Physician [facility] Pager: [phone], If no answer or between  15:00h - 7:00h: call [phone]  07/25/2014 11:50 AM

## 2014-07-25 NOTE — Patient Instructions (Signed)
ICD-9-CM ICD-10-CM   1. Panlobular emphysema 492.8 J43.1   2. Dyspnea and respiratory abnormality 786.09 R06.00     R06.89   3. Multiple lung nodules on CT 793.19 R91.8   4. Smoking history V15.82 Z72.0     Continue spiriva 1 puff daily Add BREO 1 puff daily at night  - can be expensive; take sample for now  - call in few weeks to see if it helps and we can try to get it insurance authorized  Lung nodules   - will investigate on INDIDx blood test for lung nodule lung cancer probability - do CT chest wo contrast mid-June 2016 - the 3 month followup CT Chest  Followup - CT chest mid-June 2016  - mid-June 2016 to see me or my NP

## 2014-07-26 NOTE — Telephone Encounter (Signed)
[name] stated she does not have Indi rep information. Located a rep for the Indi test [name]) and emailed him.   Will await reply.

## 2014-07-27 NOTE — Telephone Encounter (Signed)
Indi rep, [name], is no longer available to reach.   MR how do you want to proceed?

## 2014-07-29 ENCOUNTER — Other Ambulatory Visit: Payer: Self-pay | Admitting: Internal Medicine

## 2014-07-29 DIAGNOSIS — R918 Other nonspecific abnormal finding of lung field: Secondary | ICD-10-CM

## 2014-07-29 NOTE — Telephone Encounter (Signed)
Crockett dissolved. So no more Indi blood test. I just found this out. Just do fu CT as per prior OV

## 2014-07-29 NOTE — Telephone Encounter (Signed)
CT order placed. Pt already aware. Nothing further needed.

## 2014-08-11 ENCOUNTER — Encounter: Payer: Medicare Other | Admitting: Nurse Practitioner

## 2014-08-25 ENCOUNTER — Other Ambulatory Visit: Payer: Self-pay | Admitting: Adult Health

## 2014-09-21 ENCOUNTER — Ambulatory Visit (INDEPENDENT_AMBULATORY_CARE_PROVIDER_SITE_OTHER)
Admission: RE | Admit: 2014-09-21 | Discharge: 2014-09-21 | Disposition: A | Discharge status: Home / Self Care | Payer: Medicare Other | Source: Ambulatory Visit | Attending: Internal Medicine | Admitting: Internal Medicine

## 2014-09-21 DIAGNOSIS — R918 Other nonspecific abnormal finding of lung field: Secondary | ICD-10-CM | POA: Diagnosis not present

## 2014-09-25 ENCOUNTER — Telehealth: Payer: Self-pay | Admitting: Internal Medicine

## 2014-09-25 NOTE — Telephone Encounter (Signed)
[name]  You are seeing her ffor fu lung nodule 3 month CT fu and dyspnea. The CT 3rd month has several findings  Thanks  M  IMPRESSION: Majority of the pulmonary nodules seen on the previous exam are grossly stable with resolution of a tiny nodule in the lingula since the previous study. -> stable and needs 6 month fu  Stable hepatic hemangioma.  Chronic calcific pancreatitis. -> please check if she is aware of this  Scattered atherosclerotic calcification. -> needs cards referral if not had stress test in last few years  Osseous demineralization with chronic T10 compression fracture and accentuated thoracic kyphosis. -> is she aware of this. She needs to talk to pcp  Additional followup CT chest imaging recommended in 6 months to demonstrate continued stability.   Electronically Signed By: Lavonia Dana M.D. On: 09/21/2014 13:11

## 2014-09-27 ENCOUNTER — Ambulatory Visit (INDEPENDENT_AMBULATORY_CARE_PROVIDER_SITE_OTHER): Payer: Medicare Other | Admitting: Adult Health

## 2014-09-27 ENCOUNTER — Encounter: Payer: Self-pay | Admitting: Adult Health

## 2014-09-27 VITALS — BP 114/88 | HR 78 | Temp 98.2°F | Ht 66.0 in | Wt 164.0 lb

## 2014-09-27 DIAGNOSIS — R918 Other nonspecific abnormal finding of lung field: Secondary | ICD-10-CM

## 2014-09-27 DIAGNOSIS — J449 Chronic obstructive pulmonary disease, unspecified: Secondary | ICD-10-CM | POA: Diagnosis not present

## 2014-09-27 MED ORDER — ALBUTEROL SULFATE HFA 108 (90 BASE) MCG/ACT IN AERS: 2.0000 | INHALATION_SPRAY | RESPIRATORY_TRACT | Status: DC | PRN

## 2014-09-27 MED ORDER — TIOTROPIUM BROMIDE MONOHYDRATE 18 MCG IN CAPS: 18.0000 ug | ORAL_CAPSULE | Freq: Every day | RESPIRATORY_TRACT | Status: DC

## 2014-09-27 NOTE — Assessment & Plan Note (Signed)
COPD stable on Spiriva Patient did not perceive any benefit with BREO  Needs PFTs Pneumovax and Prevnar up-to-date  PLAN Continue Spiriva daily  Set up CT chest in 6 months follow up lung nodules  follow up Dr. Chase Caller in 2 months with PFT and As needed

## 2014-09-27 NOTE — Patient Instructions (Signed)
Continue Spiriva daily  Set up CT chest in 6 months follow up lung nodules  follow up Dr. Chase Caller in 2 months with PFT and As needed

## 2014-09-27 NOTE — Progress Notes (Signed)
Subjective:    Patient ID: [name], female    DOB: Mar 28, 1933, 81 y.o.   MRN: [id number] PCP GREEN, Viviann Spare, MD   HPI  IOV 07/25/2014   81 year old female accompanied by her 2 sisters who are 6 and 10 years apart. They're all childless. She is an ex-smoker. Quit in the 1990s. Problems   Dyspnea:: he has multi-year diagnosis of dyspnea with emphysema diagnosed over 15 years ago. She's been on Spiriva since then. Dyspnea has been stable but in the last several months is progressive. It is still mild but present on exertion and relieved by rest. Worsening started sometime before she took a fall and since then has been a little bit more sedentary. Also associated with worsening dyspnea is worsening aging related loss of mobility. She now walks with a cane for the last 1 year. She's moved from an apartment to an assisted living facility. There is no associated cough or wheezing.  Multiple lung nodules: She had a total abdominal hysterectomy and oophorectomy in 10 and years ago. Since then she is on observation. Apparently her CTA-125 is borderline elevated. Most recently there was a slight increase but still only borderline elevated. This resulted in investigative imaging. CT scan of the chest mid March 2016 showed multiple lung nodules bilaterally. Largest of this is a left lower lobe subpleural 1.5 cm nodule. Radiologist felt this to be of indeterminate for lung cancer or metastasis    09/27/2014 Follow up :Dyspnea/lung nodules  CT results.  Pt returns for 2 month follow up  Seen last ov for pulmonary consult for dyspnea and lung nodules.  She was on Spiriva. Started on BREO but did not like this.  Has not had a pulmonary function test in the past.  Has had a Pneumovax and Prevnar vaccine. Patient had a CT chest in March 2016 that showed multiple lung nodules bilaterally  CT chest was repeated on June 15 and showed majority of pulmonary nodules are stable with resolution of tiny  nodule in lingula ,  Discussed repeat CT in 6 months. She is a former smoker She denies any unintentional weight loss. On CT chest. Several incidental findings were noted with a stable hepatic hemangioma, scattered atherosclerotic calcification, chronic calcific pancreatitis, and chronic T10 compression fracture with thoracic kyphosis. We discussed all of these incidental findings and suggested that she follow with her primary care physician in the near future to discuss if any further testing is indicated.  She denies any chest pain, orthopnea, PND, leg swelling or hemoptysis.   Review of Systems  Constitutional:   No  weight loss, night sweats,  Fevers, chills,  +fatigue, or  lassitude.  HEENT:   No headaches,  Difficulty swallowing,  Tooth/dental problems, or  Sore throat,                No sneezing, itching, ear ache, nasal congestion, post nasal drip,   CV:  No chest pain,  Orthopnea, PND, swelling in lower extremities, anasarca, dizziness, palpitations, syncope.   GI  No heartburn, indigestion, abdominal pain, nausea, vomiting, diarrhea, change in bowel habits, loss of appetite, bloody stools.   Resp:   No excess mucus, no productive cough,  No non-productive cough,  No coughing up of blood.  No change in color of mucus.  No wheezing.  No chest wall deformity  Skin: no rash or lesions.  GU: no dysuria, change in color of urine, no urgency or frequency.  No flank pain, no hematuria  MS:  No joint pain or swelling.  No decreased range of motion.  No back pain.  Psych:  No change in mood or affect. No depression or anxiety.  No memory loss.          Objective:   Physical Exam  Constitutional: She is oriented to person, place, and time. She appears well-developed and well-nourished. No distress.  HENT:  Head: Normocephalic and atraumatic.  Right Ear: External ear normal.  Left Ear: External ear normal.  Mouth/Throat: Oropharynx is clear and moist. No oropharyngeal  exudate.  Eyes: Conjunctivae and EOM are normal. Pupils are equal, round, and reactive to light. Right eye exhibits no discharge. Left eye exhibits no discharge. No scleral icterus.  Neck: Normal range of motion. Neck supple. No JVD present. No tracheal deviation present. No thyromegaly present.  Cardiovascular: Normal rate, regular rhythm, normal heart sounds and intact distal pulses.  Exam reveals no gallop and no friction rub.   No murmur heard. Pulmonary/Chest: Effort normal and breath sounds normal. No respiratory distress. She has no wheezes. She has no rales. She exhibits no tenderness.  Abdominal: Soft. Bowel sounds are normal. She exhibits no distension and no mass. There is no tenderness. There is no rebound and no guarding.  Musculoskeletal: Normal range of motion. She exhibits no edema or tenderness.  Significant kyphoscoliosis + Uses cane  Lymphadenopathy:    She has no cervical adenopathy.  Neurological: She is alert and oriented to person, place, and time. She has normal reflexes. No cranial nerve deficit. She exhibits normal muscle tone. Coordination normal.  Skin: Skin is warm and dry. No rash noted. She is not diaphoretic. No erythema. No pallor.  Psychiatric: She has a normal mood and affect. Her behavior is normal. Judgment and thought content normal.  Vitals reviewed.         Assessment & Plan:

## 2014-09-27 NOTE — Assessment & Plan Note (Signed)
Multiple lung nodule seen on CT chest in former smoker Repeat CT chest June 15 showed stable lung nodules without change. We'll repeat CT chest in 6 months. There were multiple incidental findings on CT chest. Discussed this with patient and her sister that these will need further discussion with her primary care physician to discuss if any further testing is indicated. Patient has access to my chart with copies of her scans Note and CT will be sent to her primary care physician.

## 2014-10-26 ENCOUNTER — Ambulatory Visit (INDEPENDENT_AMBULATORY_CARE_PROVIDER_SITE_OTHER): Payer: Medicare Other | Admitting: Neurology

## 2014-10-26 ENCOUNTER — Encounter: Payer: Self-pay | Admitting: Neurology

## 2014-10-26 VITALS — BP 144/87 | HR 90 | Ht 66.0 in | Wt 164.2 lb

## 2014-10-26 DIAGNOSIS — R269 Unspecified abnormalities of gait and mobility: Secondary | ICD-10-CM | POA: Diagnosis not present

## 2014-10-26 DIAGNOSIS — G40909 Epilepsy, unspecified, not intractable, without status epilepticus: Secondary | ICD-10-CM | POA: Diagnosis not present

## 2014-10-26 DIAGNOSIS — R413 Other amnesia: Secondary | ICD-10-CM

## 2014-10-26 DIAGNOSIS — G250 Essential tremor: Secondary | ICD-10-CM

## 2014-10-26 DIAGNOSIS — Z5181 Encounter for therapeutic drug level monitoring: Secondary | ICD-10-CM | POA: Diagnosis not present

## 2014-10-26 DIAGNOSIS — G2581 Restless legs syndrome: Secondary | ICD-10-CM

## 2014-10-26 MED ORDER — MEMANTINE HCL 28 X 5 MG & 21 X 10 MG PO TABS: ORAL_TABLET | ORAL | Status: DC

## 2014-10-26 NOTE — Progress Notes (Signed)
Reason for visit: Seizures  [name] is an 81 y.o. female  History of present illness:  [name] is a 81 year old right-handed white female with a history of a well controlled seizure disorder. The last seizure was in 2011. The patient does not operate a motor vehicle at this time. She [facility] in an apartment at [facility]. She has had some issues with tremors, but she indicates that this has been a stable problem, and does not prevent her from doing her activities of daily living. The patient does have a chronic gait disorder, she has fallen twice since last seen, but each fall did not result in any injury. The patient continues to have some issues with memory, she will sometimes have difficulty remembering names for people and dates. The patient has restless leg syndrome, and she has some chronic issues with insomnia as well. She is on Lamictal and Keppra for the seizures, she is tolerating these medications. Previously, she had been on phenobarbital that resulted in some osteoporosis. Since being off of phenobarbital, she has not slept well.  Past Medical History  Diagnosis Date  . Restless legs syndrome (RLS) 09/12/2011  . Insomnia, unspecified 09/12/2011  . Acute upper respiratory infections of unspecified site 05/23/2011  . Acute bronchitis 05/23/2011  . Chronic airway obstruction, not elsewhere classified 05/23/2011  . Other and unspecified hyperlipidemia 01/31/2011  . Major depressive disorder, single episode, unspecified 01/31/2011  . Unspecified essential hypertension 01/31/2011  . Mitral valve disorders 01/31/2011  . External hemorrhoids without mention of complication 10/02/9483  . Other emphysema 01/31/2011  . Disturbance of salivary secretion 01/31/2011  . Pain in joint, site unspecified 01/31/2011  . Stiffness of joints, not elsewhere classified, multiple sites 01/31/2011  . Lumbago 01/31/2011  . Senile osteoporosis 01/31/2011  . Other convulsions 01/31/2011  .  Dizziness and giddiness 01/31/2011  . Spontaneous ecchymoses 01/31/2011  . Retinal detachment with retinal defect of right eye 2011    right eye twice  . Gait disorder 04/25/2014  . Memory disorder 04/25/2014  . Essential tremor 04/25/2014    Past Surgical History  Procedure Laterality Date  . Tonsillectomy  1941  . Abdominal hysterectomy  06/21/2003    TAH/BSO, omenectomy PSB resect, Stg IC cystadenofibroma  . Cholecystectomy  2005    Dr. Marlou Starks  . Elbow surgery Right 2008    broken   Dr. Apolonio Schneiders  . Rotator cuff repair Right 2012    Dr. Theda Sers  . Squamous cell carcinoma excision Bilateral 2012, 8/14    Mohns on legs   Dr. Sarajane Jews  . Retinal detachment surgery N/A     two    Family History  Problem Relation Age of Onset  . Heart disease Father     CHF  . Cancer Mother     breast  . Seizures Sister     Social history:  reports that she quit smoking about 26 years ago. Her smoking use included Cigarettes. She has a 40 pack-year smoking history. She has never used smokeless tobacco. She reports that she does not drink alcohol or use illicit drugs.    Allergies  Allergen Reactions  . Sulfa Antibiotics Nausea And Vomiting  . Tetracyclines & Related     Medications:  Prior to Admission medications   Medication Sig Start Date End Date Taking? Authorizing Provider  albuterol (PROVENTIL HFA;VENTOLIN HFA) 108 (90 BASE) MCG/ACT inhaler Inhale 2 puffs into the lungs every 4 (four) hours as needed for wheezing or shortness  of breath. 09/27/14  Yes Tammy S Parrett, NP  alclomethasone (ACLOVATE) 0.05 % cream Apply 1 application topically as needed.  09/10/13  Yes Historical Provider, MD  aspirin 81 MG tablet Take 81 mg by mouth daily.   Yes Historical Provider, MD  cholecalciferol (VITAMIN D) 1000 UNITS tablet One daily for Vitamin D supplementation. 10/15/12  Yes Estill Dooms, MD  Fluticasone Furoate-Vilanterol 100-25 MCG/INH AEPB Inhale 1 puff into the lungs daily. 07/25/14  Yes Brand Males, MD  lamoTRIgine (LAMICTAL) 150 MG tablet TAKE 1 TABLET TWICE DAILY AND TAKE 1/2 TABLET AT LUNCH. 06/14/14  Yes [name], MD  levETIRAcetam (KEPPRA) 500 MG tablet TAKE (1/2) TABLET THREE TIMES DAILY. 06/14/14  Yes [name], MD  pramipexole (MIRAPEX) 0.25 MG tablet TAKE 1 TABLET ONCE DAILY. 08/25/14  Yes Ward Givens, NP  simvastatin (ZOCOR) 10 MG tablet TAKE 1 TABLET ONCE DAILY. 07/11/14  Yes Tiffany L Reed, DO  tiotropium (SPIRIVA HANDIHALER) 18 MCG inhalation capsule Place 1 capsule (18 mcg total) into inhaler and inhale daily. 09/27/14  Yes Tammy S Parrett, NP    ROS:  Out of a complete 14 system review of symptoms, the patient complains only of the following symptoms, and all other reviewed systems are negative.  Fatigue Shortness of breath Swollen abdomen Constipation Restless legs Joint pain, back pain, walking difficulty Bruising easily Decreased concentration, depression  Blood pressure 144/87, pulse 90, height 5\' 6"  (1.676 m), weight 164 lb 3.2 oz (74.481 kg), last menstrual period 02/16/1975.  Physical Exam  General: The patient is alert and cooperative at the time of the examination.  Skin: No significant peripheral edema is noted.   Neurologic Exam  Mental status: The patient is alert and oriented x 3 at the time of the examination. The patient has apparent normal recent and remote memory, with an apparently normal attention span and concentration ability. Mini-Mental Status Examination done today shows a total score of 30/30. The patient is able to name 21 animals in 30 seconds.   Cranial nerves: Facial symmetry is present. Speech is normal, no aphasia or dysarthria is noted. Extraocular movements are full. Visual fields are full.  Motor: The patient has good strength in all 4 extremities.  Sensory examination: Soft touch sensation is symmetric on the face, arms, and legs.  Coordination: The patient has good finger-nose-finger and heel-to-shin  bilaterally.  Gait and station: The patient has a slightly wide-based gait, the patient is able to ambulate independently. Tandem gait is unsteady. Romberg is negative.  Reflexes: Deep tendon reflexes are symmetric.   Assessment/Plan:  1. Mild memory disturbance  2. History of seizures, well controlled  3. Essential tremor  4. Restless leg syndrome  5. Gait disorder  The patient wishes to go on medication for memory at this time. We will start Namenda. We cannot use Aricept or Exelon secondary to the history of seizures. The patient will contact our office in one month if she is tolerating the titration pack. The patient will follow-up in about 6 months, sooner if needed. The patient is encouraged to engage in exercise programs at her retirement community to improve balance. We may consider physical therapy for balance in the future. She will have blood work done today to recheck the lamotrigine level. The patient appears to be interested in learning more about Alzheimer's disease research.  Jill Alexanders MD 10/26/2014 8:28 PM  [name] [address] [place], [place] [address]  Phone [phone] Fax [phone]

## 2014-10-26 NOTE — Patient Instructions (Signed)

## 2014-10-27 LAB — LAMOTRIGINE LEVEL: Lamotrigine Lvl: 9.7 ug/mL (ref 2.0–20.0)

## 2014-10-28 ENCOUNTER — Telehealth: Payer: Self-pay

## 2014-10-28 NOTE — Telephone Encounter (Signed)
I spoke to the patient in re the CREAD study for patients with Alzheimer's Disease. I thanked the patient for her interest in the study, and I let her know that unfortunately, she does not qualify due to her seizure history.

## 2014-10-28 NOTE — Telephone Encounter (Signed)
I left a message for the patient to return my call.

## 2014-11-25 ENCOUNTER — Other Ambulatory Visit: Payer: Self-pay | Admitting: Neurology

## 2014-11-25 MED ORDER — MEMANTINE HCL 10 MG PO TABS: 10.0000 mg | ORAL_TABLET | Freq: Two times a day (BID) | ORAL | Status: DC

## 2014-12-05 ENCOUNTER — Ambulatory Visit (INDEPENDENT_AMBULATORY_CARE_PROVIDER_SITE_OTHER): Payer: Medicare Other | Admitting: Internal Medicine

## 2014-12-05 ENCOUNTER — Encounter: Payer: Self-pay | Admitting: Internal Medicine

## 2014-12-05 VITALS — BP 142/88 | HR 87 | Ht 65.0 in | Wt 164.0 lb

## 2014-12-05 DIAGNOSIS — J431 Panlobular emphysema: Secondary | ICD-10-CM | POA: Diagnosis not present

## 2014-12-05 DIAGNOSIS — J449 Chronic obstructive pulmonary disease, unspecified: Secondary | ICD-10-CM

## 2014-12-05 DIAGNOSIS — R918 Other nonspecific abnormal finding of lung field: Secondary | ICD-10-CM | POA: Diagnosis not present

## 2014-12-05 DIAGNOSIS — I2510 Atherosclerotic heart disease of native coronary artery without angina pectoris: Secondary | ICD-10-CM | POA: Diagnosis not present

## 2014-12-05 DIAGNOSIS — I2584 Coronary atherosclerosis due to calcified coronary lesion: Secondary | ICD-10-CM | POA: Diagnosis not present

## 2014-12-05 LAB — PULMONARY FUNCTION TEST
DL/VA % PRED: 78 %
DL/VA: 3.84 ml/min/mmHg/L
DLCO unc % pred: 63 %
DLCO unc: 16.24 ml/min/mmHg
FEF 25-75 Post: 0.91 L/sec
FEF 25-75 Pre: 0.67 L/sec
FEF2575-%Change-Post: 36 %
FEF2575-%Pred-Post: 61 %
FEF2575-%Pred-Pre: 45 %
FEV1-%Change-Post: 9 %
FEV1-%PRED-PRE: 75 %
FEV1-%Pred-Post: 82 %
FEV1-POST: 1.69 L
FEV1-PRE: 1.54 L
FEV1FVC-%Change-Post: 7 %
FEV1FVC-%Pred-Pre: 83 %
FEV6-%Change-Post: 2 %
FEV6-%PRED-POST: 95 %
FEV6-%PRED-PRE: 93 %
FEV6-POST: 2.48 L
FEV6-PRE: 2.43 L
FEV6FVC-%CHANGE-POST: 0 %
FEV6FVC-%PRED-POST: 103 %
FEV6FVC-%PRED-PRE: 102 %
FVC-%Change-Post: 1 %
FVC-%PRED-PRE: 90 %
FVC-%Pred-Post: 92 %
FVC-POST: 2.54 L
FVC-PRE: 2.49 L
PRE FEV6/FVC RATIO: 98 %
Post FEV1/FVC ratio: 67 %
Post FEV6/FVC ratio: 98 %
Pre FEV1/FVC ratio: 62 %
RV % PRED: 115 %
RV: 2.83 L
TLC % PRED: 102 %
TLC: 5.32 L

## 2014-12-05 NOTE — Progress Notes (Signed)
Subjective:    Patient ID: [name], female    DOB: 10/12/1932, 81 y.o.   MRN: [id number]  HPI  PCP GREEN, Viviann Spare, MD   HPI  IOV 07/25/2014   81 year old female accompanied by her 2 sisters who are 6 and 10 years apart. They're all childless. She is an ex-smoker. Quit in the 1990s. Problems   Dyspnea:: he has multi-year diagnosis of dyspnea with emphysema diagnosed over 15 years ago. She's been on Spiriva since then. Dyspnea has been stable but in the last several months is progressive. It is still mild but present on exertion and relieved by rest. Worsening started sometime before she took a fall and since then has been a little bit more sedentary. Also associated with worsening dyspnea is worsening aging related loss of mobility. She now walks with a cane for the last 1 year. She's moved from an apartment to an assisted living facility. There is no associated cough or wheezing.  Multiple lung nodules: She had a total abdominal hysterectomy and oophorectomy in 10 and years ago. Since then she is on observation. Apparently her CTA-125 is borderline elevated. Most recently there was a slight increase but still only borderline elevated. This resulted in investigative imaging. CT scan of the chest mid March 2016 showed multiple lung nodules bilaterally. Largest of this is a left lower lobe subpleural 1.5 cm nodule. Radiologist felt this to be of indeterminate for lung cancer or metastasis    09/27/2014 Follow up :Dyspnea/lung nodules  CT results.  Pt returns for 2 month follow up  Seen last ov for pulmonary consult for dyspnea and lung nodules.  She was on Spiriva. Started on BREO but did not like this.  Has not had a pulmonary function test in the past.  Has had a Pneumovax and Prevnar vaccine. Patient had a CT chest in March 2016 that showed multiple lung nodules bilaterally  CT chest was repeated on June 15 and showed majority of pulmonary nodules are stable with resolution of  tiny nodule in lingula ,  Discussed repeat CT in 6 months. She is a former smoker She denies any unintentional weight loss. On CT chest. Several incidental findings were noted with a stable hepatic hemangioma, scattered atherosclerotic calcification, chronic calcific pancreatitis, and chronic T10 compression fracture with thoracic kyphosis. We discussed all of these incidental findings and suggested that she follow with her primary care physician in the near future to discuss if any further testing is indicated.  She denies any chest pain, orthopnea, PND, leg swelling or hemoptysis.     OV 12/05/2014  Chief Complaint  Patient presents with  . Follow-up    Pt here after PFT. Pt states her breathing is unchanged. Pt mild dry cough, DOE, chest tighenss when SOB.      Follow-up  - Emphysema associated with shortness of breath: She is accompanied by her sister Sonia Baller. Her sister Inez Catalina did not come with her today. She has a clinical diagnosis of emphysema. She was already on Spiriva. We added Dulera but she reported that she did not tolerated well. She's back on Spiriva. She still having shortness of breath when she walks one fourth of a mile to the dining room. She gets mildly dyspneic when she climbs one flight of stairs but no dyspnea for changing clothes. She is frustrated by this dyspnea. She had probably function test today that I personally visualized the image. Spirometry is normal. Total lung capacity is normal. DLCO is reduced moderately  at 63%/25.6. She seems out of proportion dyspnea. She seems open to the idea of pulmonary rehabilitation   She had a CT scan of the chest June 2016 that showed multiple lung nodules. She has follow-up CT scan of the chest scheduled for December 2016  Incidental finding of coronary artery calcification on the CT scan of the chest June 2016: She does not have any chest pain. She reports having seen Dr. Tana Conch of [facility] cardiology 8 years ago.  Apparently cardiac stress test was normal at that time. She was also diagnosed to have mitral valve prolapse. She's not followed up with him since then. She is a little bit worried about the calcification and is open to seeing cardiology again particularly in the context of out of proportion dyspnea   Review of Systems  Constitutional: Positive for fatigue. Negative for fever and unexpected weight change.  HENT: Negative for congestion, dental problem, ear pain, nosebleeds, postnasal drip, rhinorrhea, sinus pressure, sneezing, sore throat and trouble swallowing.   Eyes: Negative for redness and itching.  Respiratory: Positive for cough and shortness of breath. Negative for chest tightness and wheezing.   Cardiovascular: Negative for palpitations and leg swelling.  Gastrointestinal: Negative for nausea and vomiting.  Genitourinary: Negative for dysuria.  Musculoskeletal: Negative for joint swelling.  Skin: Negative for rash.  Neurological: Negative for headaches.  Hematological: Does not bruise/bleed easily.  Psychiatric/Behavioral: Negative for dysphoric mood. The patient is not nervous/anxious.        Objective:   Physical Exam  Constitutional: She is oriented to person, place, and time. She appears well-developed and well-nourished. No distress.  HENT:  Head: Normocephalic and atraumatic.  Right Ear: External ear normal.  Left Ear: External ear normal.  Mouth/Throat: Oropharynx is clear and moist. No oropharyngeal exudate.  Eyes: Conjunctivae and EOM are normal. Pupils are equal, round, and reactive to light. Right eye exhibits no discharge. Left eye exhibits no discharge. No scleral icterus.  Neck: Normal range of motion. Neck supple. No JVD present. No tracheal deviation present. No thyromegaly present.  Cardiovascular: Normal rate, regular rhythm, normal heart sounds and intact distal pulses.  Exam reveals no gallop and no friction rub.   No murmur heard. Pulmonary/Chest: Effort  normal and breath sounds normal. No respiratory distress. She has no wheezes. She has no rales. She exhibits no tenderness.  Abdominal: Soft. Bowel sounds are normal. She exhibits no distension and no mass. There is no tenderness. There is no rebound and no guarding.  Musculoskeletal: Normal range of motion. She exhibits no edema or tenderness.  Kyphotic  Lymphadenopathy:    She has no cervical adenopathy.  Neurological: She is alert and oriented to person, place, and time. She has normal reflexes. No cranial nerve deficit. She exhibits normal muscle tone. Coordination normal.  Skin: Skin is warm and dry. No rash noted. She is not diaphoretic. No erythema. No pallor.  Psychiatric: She has a normal mood and affect. Her behavior is normal. Judgment and thought content normal.  Vitals reviewed.   Filed Vitals:   12/05/14 1406  BP: 142/88  Pulse: 87  Height: 5\' 5"  (1.651 m)  Weight: 164 lb (74.39 kg)  SpO2: 95%        Assessment & Plan:     ICD-9-CM ICD-10-CM   1. Multiple lung nodules on CT 793.19 R91.8   2. Panlobular emphysema 492.8 J43.1 Pulmonary rehab therapeutic exercise  3. Coronary artery calcification 414.00 I25.10 Ambulatory referral to Cardiology   414.4 I25.84     #  Lung nodules = repeat ct chest wo contrast in  3 months - order probably in plan  #emphsyema with shortness of breath slightly out of proportion  - continue spiriva - refer pulmonary rehab - Flu shot in the fall  #Coronary artery calcification  - Refer Dr. Candee Furbish; you likely need repeat cardiac stress test   #Followup -Return in 3 months after CT chest    Dr. Brand Males, M.D., [facility].C.P Pulmonary and Critical Care Medicine Staff Physician [place] Pager: [phone], If no answer or between  15:00h - 7:00h: call [phone]  12/05/2014 2:38 PM

## 2014-12-05 NOTE — Progress Notes (Signed)
PFT done today. 

## 2014-12-05 NOTE — Patient Instructions (Signed)
ICD-9-CM ICD-10-CM   1. Multiple lung nodules on CT 793.19 R91.8   2. Panlobular emphysema 492.8 J43.1   3. Coronary artery calcification 414.00 I25.10    414.4 I25.84    #Lung nodules = repeat ct chest wo contrast in  3 months - order probably in plan  #emphsyema with shortness of breath slightly out of proportion  - continue spiriva - refer pulmonary rehab - Flu shot in the fall  #Coronary artery calcification  - Refer Dr. Candee Furbish; you likely need repeat cardiac stress test   #Followup -Return in 3 months after CT chest

## 2014-12-14 ENCOUNTER — Other Ambulatory Visit: Payer: Self-pay | Admitting: Neurology

## 2014-12-14 ENCOUNTER — Ambulatory Visit (INDEPENDENT_AMBULATORY_CARE_PROVIDER_SITE_OTHER): Payer: Medicare Other | Admitting: Emergency Medicine

## 2014-12-14 VITALS — BP 124/68 | HR 95 | Temp 98.4°F | Resp 18 | Ht 65.0 in | Wt 166.0 lb

## 2014-12-14 DIAGNOSIS — I739 Peripheral vascular disease, unspecified: Secondary | ICD-10-CM

## 2014-12-14 DIAGNOSIS — M7989 Other specified soft tissue disorders: Secondary | ICD-10-CM

## 2014-12-14 DIAGNOSIS — L03116 Cellulitis of left lower limb: Secondary | ICD-10-CM | POA: Diagnosis not present

## 2014-12-14 LAB — POCT CBC
Granulocyte percent: 67.8 %G (ref 37–80)
HCT, POC: 44.6 % (ref 37.7–47.9)
HEMOGLOBIN: 14.3 g/dL (ref 12.2–16.2)
Lymph, poc: 1.2 (ref 0.6–3.4)
MCH, POC: 28.7 pg (ref 27–31.2)
MCHC: 32.1 g/dL (ref 31.8–35.4)
MCV: 89.3 fL (ref 80–97)
MID (cbc): 0.7 (ref 0–0.9)
MPV: 7.3 fL (ref 0–99.8)
PLATELET COUNT, POC: 211 10*3/uL (ref 142–424)
POC Granulocyte: 3.9 (ref 2–6.9)
POC LYMPH %: 20.2 % (ref 10–50)
POC MID %: 12 %M (ref 0–12)
RBC: 5 M/uL (ref 4.04–5.48)
RDW, POC: 14.1 %
WBC: 5.8 10*3/uL (ref 4.6–10.2)

## 2014-12-14 LAB — GLUCOSE, POCT (MANUAL RESULT ENTRY): POC Glucose: 88 mg/dl (ref 70–99)

## 2014-12-14 MED ORDER — CEFTRIAXONE SODIUM 500 MG IJ SOLR
1000.0000 mg | Freq: Once | INTRAMUSCULAR | Status: AC
  Administered 2014-12-14: 1000 mg via INTRAMUSCULAR

## 2014-12-14 MED ORDER — CEFTRIAXONE SODIUM 1 G IJ SOLR: 1.0000 g | Freq: Once | INTRAMUSCULAR | Status: DC

## 2014-12-14 MED ORDER — CEFTRIAXONE SODIUM 1 G IJ SOLR
1.0000 g | Freq: Once | INTRAMUSCULAR | Status: DC
Start: 2014-12-14 — End: 2014-12-14

## 2014-12-14 MED ORDER — CLINDAMYCIN HCL 300 MG PO CAPS: 300.0000 mg | ORAL_CAPSULE | Freq: Three times a day (TID) | ORAL | Status: DC

## 2014-12-14 NOTE — Patient Instructions (Signed)

## 2014-12-14 NOTE — Progress Notes (Addendum)
Patient ID: [name], female   DOB: 11/18/1932, 81 y.o.   MRN: [id number]    This chart was scribed for [name], MD by Ladene Artist, ED Scribe. The patient was seen in room 10. Patient's care was started at 9:58 AM.  Chief Complaint:  Chief Complaint  Patient presents with  . Wound Infection    left leg, swollen and red   . Nausea  . Fatigue   HPI: [name] is a 81 y.o. female who reports to [facility] today complaining of gradually worsening lower left leg swelling for the past 2 days. Pt states that she was seen 1 week ago for a biopsy of her left lower extremity for precancerous cells. She reports associated redness to the affected area onset this morning. She has tried washing the area with soap and water, applying mupirocin and bandaging the area without significant relief. Pt is a former smoker; she quit approximately 20 years ago. No h/o DM. Allergy to Sulfa antibiotics and Tetracyclines.   Pt has been living at [facility] for  8 years. 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   Past Medical History  Diagnosis Date  . Restless legs syndrome (RLS) 09/12/2011  . Insomnia, unspecified 09/12/2011  . Acute upper respiratory infections of unspecified site 05/23/2011  . Acute bronchitis 05/23/2011  . Chronic airway obstruction, not elsewhere classified 05/23/2011  . Other and unspecified hyperlipidemia 01/31/2011  . Major depressive disorder, single episode, unspecified 01/31/2011  . Unspecified essential hypertension 01/31/2011  . Mitral valve disorders 01/31/2011  . External hemorrhoids without mention of complication 25/85/2778  . Other emphysema 01/31/2011  . Disturbance of salivary secretion 01/31/2011  . Pain in joint, site unspecified 01/31/2011  . Stiffness of joints, not elsewhere classified, multiple sites 01/31/2011  . Lumbago 01/31/2011  . Senile osteoporosis 01/31/2011  . Other convulsions 01/31/2011  . Dizziness  and giddiness 01/31/2011  . Spontaneous ecchymoses 01/31/2011  . Retinal detachment with retinal defect of right eye 2011    right eye twice  . Gait disorder 04/25/2014  . Memory disorder 04/25/2014  . Essential tremor 04/25/2014   Past Surgical History  Procedure Laterality Date  . Tonsillectomy  1941  . Abdominal hysterectomy  06/21/2003    TAH/BSO, omenectomy PSB resect, Stg IC cystadenofibroma  . Cholecystectomy  2005    Dr. Marlou Starks  . Elbow surgery Right 2008    broken   Dr. Apolonio Schneiders  . Rotator cuff repair Right 2012    Dr. Theda Sers  . Squamous cell carcinoma excision Bilateral 2012, 8/14    [place]s on legs   Dr. Sarajane Jews  . Retinal detachment surgery N/A     two  . Eye surgery    . Coronary artery bypass graft     Social History   Social History  . Marital Status: Divorced    Spouse Name: N/A  . Number of Children: 0  . Years of Education: N/A   Occupational History  . retired Tourist information centre manager for [employer]    Social History Main Topics  . Smoking status: Former Smoker -- 1.00 packs/day for 40 years    Types: Cigarettes    Quit date: 04/08/1988  . Smokeless tobacco: Never Used     Comment: quit in 1990  . Alcohol Use: No  . Drug Use: No  . Sexual Activity: No   Other Topics Concern  . None   Social History Narrative   Lives [facility] [name]   No children   Divorced   Exercise climbs steps 4 flights daily PT for [employer]   Alcohol none   Stopped smoking 1989   Patient drinks 3 large sodas daily.   Patient is right handed.      Family History  Problem Relation Age of Onset  . Heart disease Father     CHF  . Cancer Mother     breast  . Seizures Sister    Allergies  Allergen Reactions  . Sulfa Antibiotics Nausea And Vomiting  . Tetracyclines & Related    Prior to Admission medications   Medication Sig Start Date End Date Taking? Authorizing Provider  albuterol (PROVENTIL HFA;VENTOLIN HFA) 108 (90 BASE) MCG/ACT inhaler Inhale 2 puffs into the  lungs every 4 (four) hours as needed for wheezing or shortness of breath. 09/27/14  Yes Tammy S Parrett, NP  alclomethasone (ACLOVATE) 0.05 % cream Apply 1 application topically as needed.  09/10/13  Yes Historical Provider, MD  aspirin 81 MG tablet Take 81 mg by mouth daily.   Yes Historical Provider, MD  cholecalciferol (VITAMIN D) 1000 UNITS tablet One daily for Vitamin D supplementation. 10/15/12  Yes Estill Dooms, MD  Fluticasone Furoate-Vilanterol 100-25 MCG/INH AEPB Inhale 1 puff into the lungs daily. 07/25/14  Yes Brand Males, MD  lamoTRIgine (LAMICTAL) 150 MG tablet TAKE 1 TABLET TWICE DAILY AND TAKE 1/2 TABLET AT LUNCH. 06/14/14  Yes [name], MD  levETIRAcetam (KEPPRA) 500 MG tablet TAKE (1/2) TABLET THREE TIMES DAILY. 06/14/14  Yes [name], MD  memantine (NAMENDA) 10 MG tablet Take 1 tablet (10 mg total) by mouth 2 (two) times daily. 11/25/14  Yes [name], MD  pramipexole (MIRAPEX) 0.25 MG tablet TAKE 1 TABLET ONCE DAILY. 08/25/14  Yes Ward Givens, NP  simvastatin (ZOCOR) 10 MG tablet TAKE 1 TABLET ONCE DAILY. 07/11/14  Yes Tiffany L Reed, DO  tiotropium (SPIRIVA HANDIHALER) 18 MCG inhalation capsule Place 1 capsule (18 mcg total) into inhaler and inhale daily. 09/27/14  Yes Tammy S Parrett, NP     ROS: The patient denies fevers, chills, night sweats, unintentional weight loss, chest pain, palpitations, wheezing, dyspnea on exertion, nausea, vomiting, abdominal pain, dysuria, hematuria, melena, numbness, weakness, or tingling. + wound, + leg swelling,  + color change  All other systems have been reviewed and were otherwise negative with the exception of those mentioned in the HPI and as above.    PHYSICAL EXAM: Filed Vitals:   12/14/14 0953  BP: 124/68  Pulse: 95  Temp: 98.4 F (36.9 C)  Resp: 18   Body mass index is 27.62 kg/(m^2).   General: Alert, no acute distress HEENT:  Normocephalic, atraumatic, oropharynx patent. Eye: Juliette Mangle  [facility] Cardiovascular:  Regular rate and rhythm, no rubs murmurs or gallops.  No Carotid bruits, radial pulse intact. No pedal edema.  Respiratory: Clear to auscultation bilaterally.  No wheezes, rales, or rhonchi.  No cyanosis, no use of accessory musculature Abdominal: No organomegaly, abdomen is soft and non-tender, positive bowel sounds.  No masses. Musculoskeletal: Gait intact. No edema, tenderness Skin: 2 cm open biopsy site on the back of L calf. Significant statis changes. These is a large area of redness medially approximately 10 x 8 in which is warm. No DP or PT pulses.  Neurologic: Facial musculature symmetric. Psychiatric: Patient acts appropriately throughout our interaction. Lymphatic: No cervical or submandibular lymphadenopathy Genitourinary/Anorectal: No acute findings  LABS: Results for orders placed or performed in  visit on 12/14/14  POCT CBC  Result Value Ref Range   WBC 5.8 4.6 - 10.2 K/uL   Lymph, poc 1.2 0.6 - 3.4   POC LYMPH PERCENT 20.2 10 - 50 %L   MID (cbc) 0.7 0 - 0.9   POC MID % 12.0 0 - 12 %M   POC Granulocyte 3.9 2 - 6.9   Granulocyte percent 67.8 37 - 80 %G   RBC 5.00 4.04 - 5.48 M/uL   Hemoglobin 14.3 12.2 - 16.2 g/dL   HCT, POC 44.6 37.7 - 47.9 %   MCV 89.3 80 - 97 fL   MCH, POC 28.7 27 - 31.2 pg   MCHC 32.1 31.8 - 35.4 g/dL   RDW, POC 14.1 %   Platelet Count, POC 211 142 - 424 K/uL   MPV 7.3 0 - 99.8 fL  POCT glucose (manual entry)  Result Value Ref Range   POC Glucose 88 70 - 99 mg/dl     EKG/XRAY:   Primary read interpreted by Dr. Everlene Farrier at [facility].   ASSESSMENT/PLAN: She was given 1 g Rocephin. She will be on Cleocin 3 times a day. Recheck in 48 hours.I personally performed the services described in this documentation, which was scribed in my presence. The recorded information has been reviewed and is accurate.   Gross sideeffects, risk and benefits, and alternatives of medications d/w patient. Patient is aware that all medications have  potential sideeffects and we are unable to predict every sideeffect or drug-drug interaction that may occur.  Arlyss Queen MD 12/14/2014 10:52 AM

## 2014-12-16 ENCOUNTER — Ambulatory Visit (INDEPENDENT_AMBULATORY_CARE_PROVIDER_SITE_OTHER): Payer: Medicare Other | Admitting: Emergency Medicine

## 2014-12-16 VITALS — BP 144/84 | HR 83 | Temp 97.4°F | Resp 18 | Ht 65.0 in | Wt 168.2 lb

## 2014-12-16 DIAGNOSIS — L03116 Cellulitis of left lower limb: Secondary | ICD-10-CM | POA: Diagnosis not present

## 2014-12-16 MED ORDER — GENTAMICIN SULFATE 0.1 % EX OINT: TOPICAL_OINTMENT | CUTANEOUS | Status: DC

## 2014-12-16 NOTE — Progress Notes (Signed)
Patient ID: [name], female   DOB: Sep 03, 1932, 80 y.o.   MRN: [id number]    This chart was scribed for Martha Russian, MD by Ladene Artist, ED Scribe. The patient was seen in room 11. Patient's care was started at 11:00 AM.  Chief Complaint:  Chief Complaint  Patient presents with  . Follow-up    cellulitis on Lt leg    HPI: [name] is a 81 y.o. female who reports to [facility] today for a follow-up regarding cellulitis to left lower leg. Pt was seen 2 days ago for cellulitis first noticed 4 days ago following a biopsy. She has been compliant with Clindamycin which she states that significantly improved symptoms. Pt's wound culture on 12/14/14 showed Rare Pseudomonas Aeruginosap. She reports that redness, swelling and warmth to her lower left leg has improved, however, left foot swelling has not. She denies fever and chills.   Past Medical History  Diagnosis Date  . Restless legs syndrome (RLS) 09/12/2011  . Insomnia, unspecified 09/12/2011  . Acute upper respiratory infections of unspecified site 05/23/2011  . Acute bronchitis 05/23/2011  . Chronic airway obstruction, not elsewhere classified 05/23/2011  . Other and unspecified hyperlipidemia 01/31/2011  . Major depressive disorder, single episode, unspecified 01/31/2011  . Unspecified essential hypertension 01/31/2011  . Mitral valve disorders 01/31/2011  . External hemorrhoids without mention of complication 00/92/3300  . Other emphysema 01/31/2011  . Disturbance of salivary secretion 01/31/2011  . Pain in joint, site unspecified 01/31/2011  . Stiffness of joints, not elsewhere classified, multiple sites 01/31/2011  . Lumbago 01/31/2011  . Senile osteoporosis 01/31/2011  . Other convulsions 01/31/2011  . Dizziness and giddiness 01/31/2011  . Spontaneous ecchymoses 01/31/2011  . Retinal detachment with retinal defect of right eye 2011    right eye twice  . Gait disorder 04/25/2014  . Memory disorder 04/25/2014  . Essential  tremor 04/25/2014   Past Surgical History  Procedure Laterality Date  . Tonsillectomy  1941  . Abdominal hysterectomy  06/21/2003    TAH/BSO, omenectomy PSB resect, Stg IC cystadenofibroma  . Cholecystectomy  2005    Dr. Marlou Starks  . Elbow surgery Right 2008    broken   Dr. Apolonio Schneiders  . Rotator cuff repair Right 2012    Dr. Theda Sers  . Squamous cell carcinoma excision Bilateral 2012, 8/14    Mohns on legs   Dr. Sarajane Jews  . Retinal detachment surgery N/A     two  . Eye surgery    . Coronary artery bypass graft     Social History   Social History  . Marital Status: Divorced    Spouse Name: N/A  . Number of Children: 0  . Years of Education: N/A   Occupational History  . retired Tourist information centre manager for [employer]    Social History Main Topics  . Smoking status: Former Smoker -- 1.00 packs/day for 40 years    Types: Cigarettes    Quit date: 04/08/1988  . Smokeless tobacco: Never Used     Comment: quit in 1990  . Alcohol Use: No  . Drug Use: No  . Sexual Activity: No   Other Topics Concern  . None   Social History Narrative   Lives at [name]   No children   Divorced   Exercise climbs steps 4 flights daily PT for baalance   Alcohol none   Stopped smoking 1989   Patient drinks 3 large sodas daily.   Patient is right handed.  Family History  Problem Relation Age of Onset  . Heart disease Father     CHF  . Cancer Mother     breast  . Seizures Sister    Allergies  Allergen Reactions  . Sulfa Antibiotics Nausea And Vomiting  . Tetracyclines & Related    Prior to Admission medications   Medication Sig Start Date End Date Taking? Authorizing Provider  albuterol (PROVENTIL HFA;VENTOLIN HFA) 108 (90 BASE) MCG/ACT inhaler Inhale 2 puffs into the lungs every 4 (four) hours as needed for wheezing or shortness of breath. 09/27/14  Yes Tammy S Parrett, NP  alclomethasone (ACLOVATE) 0.05 % cream Apply 1 application topically as needed.  09/10/13  Yes Historical  Provider, MD  aspirin 81 MG tablet Take 81 mg by mouth daily.   Yes Historical Provider, MD  cholecalciferol (VITAMIN D) 1000 UNITS tablet One daily for Vitamin D supplementation. 10/15/12  Yes Estill Dooms, MD  clindamycin (CLEOCIN) 300 MG capsule Take 1 capsule (300 mg total) by mouth 3 (three) times daily. 12/14/14  Yes Martha Russian, MD  Fluticasone Furoate-Vilanterol 100-25 MCG/INH AEPB Inhale 1 puff into the lungs daily. 07/25/14  Yes Brand Males, MD  lamoTRIgine (LAMICTAL) 150 MG tablet TAKE 1 TABLET TWICE DAILY AND TAKE 1/2 TABLET AT LUNCH. 12/14/14  Yes [name], MD  levETIRAcetam (KEPPRA) 500 MG tablet TAKE (1/2) TABLET THREE TIMES DAILY. 12/14/14  Yes [name], MD  memantine (NAMENDA) 10 MG tablet Take 1 tablet (10 mg total) by mouth 2 (two) times daily. 11/25/14  Yes [name], MD  pramipexole (MIRAPEX) 0.25 MG tablet TAKE 1 TABLET ONCE DAILY. 08/25/14  Yes Ward Givens, NP  simvastatin (ZOCOR) 10 MG tablet TAKE 1 TABLET ONCE DAILY. 07/11/14  Yes Tiffany L Reed, DO  tiotropium (SPIRIVA HANDIHALER) 18 MCG inhalation capsule Place 1 capsule (18 mcg total) into inhaler and inhale daily. 09/27/14  Yes Tammy S Parrett, NP     ROS: The patient denies fevers, chills, night sweats, unintentional weight loss, chest pain, palpitations, wheezing, dyspnea on exertion, nausea, vomiting, abdominal pain, dysuria, hematuria, melena, numbness, weakness, or tingling. + rash, + color change  All other systems have been reviewed and were otherwise negative with the exception of those mentioned in the HPI and as above.    PHYSICAL EXAM: Filed Vitals:   12/16/14 1049  BP: 144/84  Pulse: 83  Temp: 97.4 F (36.3 C)  Resp: 18   Body mass index is 27.99 kg/(m^2).   General: Alert, no acute distress HEENT:  Normocephalic, atraumatic, oropharynx patent. Eye: [facility]te M[facility] Cardiovascular:  Regular rate and rhythm, no rubs murmurs or gallops.  No Carotid bruits, radial pulse intact.  No pedal edema.  Respiratory: Clear to auscultation bilaterally.  No wheezes, rales, or rhonchi.  No cyanosis, no use of accessory musculature Abdominal: No organomegaly, abdomen is soft and non-tender, positive bowel sounds.  No masses. Musculoskeletal: Gait intact. No edema, tenderness Skin: 2x2 healing biopsy site to lower L lateral calf. Area of redness and tenderness over L shin has greatly improved.  Neurologic: Facial musculature symmetric. Psychiatric: Patient acts appropriately throughout our interaction. Lymphatic: No cervical or submandibular lymphadenopathy Genitourinary/Anorectal: No acute findings  LABS: Results for orders placed or performed in visit on 12/14/14  Wound culture  Result Value Ref Range   Gram Stain No WBC Seen    Gram Stain No Squamous Epithelial Cells Seen    Gram Stain No Organisms Seen    Preliminary Report Rare  PSEUDOMONAS AERUGINOSA   POCT CBC  Result Value Ref Range   WBC 5.8 4.6 - 10.2 K/uL   Lymph, poc 1.2 0.6 - 3.4   POC LYMPH PERCENT 20.2 10 - 50 %L   MID (cbc) 0.7 0 - 0.9   POC MID % 12.0 0 - 12 %M   POC Granulocyte 3.9 2 - 6.9   Granulocyte percent 67.8 37 - 80 %G   RBC 5.00 4.04 - 5.48 M/uL   Hemoglobin 14.3 12.2 - 16.2 g/dL   HCT, POC 44.6 37.7 - 47.9 %   MCV 89.3 80 - 97 fL   MCH, POC 28.7 27 - 31.2 pg   MCHC 32.1 31.8 - 35.4 g/dL   RDW, POC 14.1 %   Platelet Count, POC 211 142 - 424 K/uL   MPV 7.3 0 - 99.8 fL  POCT glucose (manual entry)  Result Value Ref Range   POC Glucose 88 70 - 99 mg/dl    EKG/XRAY:   Primary read interpreted by [facility]lene Farrier at [facility].   ASSESSMENT/PLAN: Her wound looks better. Cultures growing a few Pseudomonas. Her cellulitis has definitely responded to these clindamycin. Will change from Bactroban to Garamycin ointment but continue clindamycin. Recheck 1 week.I personally performed the services described in this documentation, which was scribed in my presence. The recorded information has been reviewed and  is accurate.   Gross sideeffects, risk and benefits, and alternatives of medications d/w patient. Patient is aware that all medications have potential sideeffects and we are unable to predict every sideeffect or drug-drug interaction that may occur.  Arlyss Queen MD 12/16/2014 11:00 AM

## 2014-12-17 ENCOUNTER — Telehealth: Payer: Self-pay

## 2014-12-17 LAB — WOUND CULTURE
GRAM STAIN: NONE SEEN
GRAM STAIN: NONE SEEN
GRAM STAIN: NONE SEEN

## 2014-12-17 NOTE — Telephone Encounter (Signed)
Pharmacy is calling about the medication gentamicin 0.1% rx is written for ointment but they have cream can it be changed    [phone]  noah

## 2014-12-19 ENCOUNTER — Other Ambulatory Visit: Payer: Self-pay | Admitting: Emergency Medicine

## 2014-12-19 MED ORDER — GENTAMICIN SULFATE 0.1 % EX CREA: 1.0000 "application " | TOPICAL_CREAM | Freq: Three times a day (TID) | CUTANEOUS | Status: DC

## 2014-12-19 NOTE — Telephone Encounter (Signed)
Okay to change prescription to cream.

## 2014-12-20 NOTE — Telephone Encounter (Signed)
Spoke with noah ok'd changed to prescription

## 2014-12-21 ENCOUNTER — Ambulatory Visit (INDEPENDENT_AMBULATORY_CARE_PROVIDER_SITE_OTHER): Payer: Medicare Other | Admitting: Emergency Medicine

## 2014-12-21 VITALS — BP 122/2 | HR 80 | Temp 98.1°F | Resp 16 | Ht 65.0 in | Wt 168.0 lb

## 2014-12-21 DIAGNOSIS — L03116 Cellulitis of left lower limb: Secondary | ICD-10-CM | POA: Diagnosis not present

## 2014-12-21 DIAGNOSIS — Z23 Encounter for immunization: Secondary | ICD-10-CM | POA: Diagnosis not present

## 2014-12-21 NOTE — Progress Notes (Signed)
Subjective:  This chart was scribed for Arlyss Queen MD, by Tamsen Roers, at [facility].  This patient was seen in room 1 and the patient's care was started at 11:15 AM.    Patient ID: [name], female    DOB: 03/21/1933, 81 y.o.   MRN: [id number] Chief Complaint  Patient presents with  . Follow-up    cellulitits/ Dr. Everlene Farrier  . Immunizations    flu shot    HPI HPI Comments: [name] is a 81 y.o. female who presents to the [facility] for a follow up regarding her cellulitis on her left leg.  She states that her leg feels much better now and is healing.  She would also like a flu shot today. She will be finished with using her oral medication today.  Her dermatologist is on vacation and will be seeing her when she comes back.     Patient Active Problem List   Diagnosis Date Noted  . Coronary artery calcification 12/05/2014  . Panlobular emphysema 07/25/2014  . Dyspnea and respiratory abnormality 07/25/2014  . Multiple lung nodules on CT 07/25/2014  . Smoking history 07/25/2014  . Pain of right lower leg 05/23/2014  . Fall 05/23/2014  . Gait disorder 04/25/2014  . Memory disorder 04/25/2014  . Essential tremor 04/25/2014  . Advanced directives, counseling/discussion 02/18/2013  . Seizure disorder 10/15/2012  . Cancer of skin, squamous cell 10/15/2012  . Unspecified vitamin D deficiency 10/15/2012  . Other and unspecified hyperlipidemia 10/15/2012  . Edema 09/10/2012  . Restless legs syndrome (RLS) 09/12/2011  . Insomnia, unspecified 09/12/2011  . COPD (chronic obstructive pulmonary disease) 05/23/2011  . Essential hypertension 01/31/2011  . Senile osteoporosis 01/31/2011  . Major depressive disorder, single episode 01/31/2011   Past Medical History  Diagnosis Date  . Restless legs syndrome (RLS) 09/12/2011  . Insomnia, unspecified 09/12/2011  . Acute upper respiratory infections of unspecified site 05/23/2011  .  Acute bronchitis 05/23/2011  . Chronic airway obstruction, not elsewhere classified 05/23/2011  . Other and unspecified hyperlipidemia 01/31/2011  . Major depressive disorder, single episode, unspecified 01/31/2011  . Unspecified essential hypertension 01/31/2011  . Mitral valve disorders 01/31/2011  . External hemorrhoids without mention of complication 66/29/4765  . Other emphysema 01/31/2011  . Disturbance of salivary secretion 01/31/2011  . Pain in joint, site unspecified 01/31/2011  . Stiffness of joints, not elsewhere classified, multiple sites 01/31/2011  . Lumbago 01/31/2011  . Senile osteoporosis 01/31/2011  . Other convulsions 01/31/2011  . Dizziness and giddiness 01/31/2011  . Spontaneous ecchymoses 01/31/2011  . Retinal detachment with retinal defect of right eye 2011    right eye twice  . Gait disorder 04/25/2014  . Memory disorder 04/25/2014  . Essential tremor 04/25/2014   Past Surgical History  Procedure Laterality Date  . Tonsillectomy  1941  . Abdominal hysterectomy  06/21/2003    TAH/BSO, omenectomy PSB resect, Stg IC cystadenofibroma  . Cholecystectomy  2005    Dr. Marlou Starks  . Elbow surgery Right 2008    broken   Dr. Apolonio Schneiders  . Rotator cuff repair Right 2012    Dr. Theda Sers  . Squamous cell carcinoma excision Bilateral 2012, 8/14    Mohns on legs   Dr. Sarajane Jews  . Retinal detachment surgery N/A     two  . Eye surgery    . Coronary artery bypass graft     Allergies  Allergen Reactions  . Sulfa Antibiotics Nausea And Vomiting  .  Tetracyclines & Related    Prior to Admission medications   Medication Sig Start Date End Date Taking? Authorizing Provider  albuterol (PROVENTIL HFA;VENTOLIN HFA) 108 (90 BASE) MCG/ACT inhaler Inhale 2 puffs into the lungs every 4 (four) hours as needed for wheezing or shortness of breath. 09/27/14  Yes Tammy S Parrett, NP  alclomethasone (ACLOVATE) 0.05 % cream Apply 1 application topically as needed.  09/10/13  Yes Historical Provider, MD   aspirin 81 MG tablet Take 81 mg by mouth daily.   Yes Historical Provider, MD  cholecalciferol (VITAMIN D) 1000 UNITS tablet One daily for Vitamin D supplementation. 10/15/12  Yes Estill Dooms, MD  clindamycin (CLEOCIN) 300 MG capsule Take 1 capsule (300 mg total) by mouth 3 (three) times daily. 12/14/14  Yes [name], MD  Fluticasone Furoate-Vilanterol 100-25 MCG/INH AEPB Inhale 1 puff into the lungs daily. 07/25/14  Yes Brand Males, MD  gentamicin cream (GARAMYCIN) 0.1 % Apply 1 application topically 3 (three) times daily. 12/19/14  Yes [name], MD  lamoTRIgine (LAMICTAL) 150 MG tablet TAKE 1 TABLET TWICE DAILY AND TAKE 1/2 TABLET AT LUNCH. 12/14/14  Yes [name], MD  levETIRAcetam (KEPPRA) 500 MG tablet TAKE (1/2) TABLET THREE TIMES DAILY. 12/14/14  Yes [name], MD  memantine (NAMENDA) 10 MG tablet Take 1 tablet (10 mg total) by mouth 2 (two) times daily. 11/25/14  Yes [name], MD  pramipexole (MIRAPEX) 0.25 MG tablet TAKE 1 TABLET ONCE DAILY. 08/25/14  Yes Ward Givens, NP  simvastatin (ZOCOR) 10 MG tablet TAKE 1 TABLET ONCE DAILY. 07/11/14  Yes Tiffany L Reed, DO  tiotropium (SPIRIVA HANDIHALER) 18 MCG inhalation capsule Place 1 capsule (18 mcg total) into inhaler and inhale daily. 09/27/14  Yes Tammy Jeralene Huff, NP   Social History   Social History  . Marital Status: Divorced    Spouse Name: N/A  . Number of Children: 0  . Years of Education: N/A   Occupational History  . retired Tourist information centre manager for [employer]    Social History Main Topics  . Smoking status: Former Smoker -- 1.00 packs/day for 40 years    Types: Cigarettes    Quit date: 04/08/1988  . Smokeless tobacco: Never Used     Comment: quit in 1990  . Alcohol Use: No  . Drug Use: No  . Sexual Activity: No   Other Topics Concern  . Not on file   Social History Narrative   Lives at [facility]   No children   Divorced   Exercise climbs steps 4 flights daily PT for [place]al[place]     Alcohol none   Stopped smoking 1989   Patient drinks 3 large sodas daily.   Patient is right handed.          Review of Systems  Constitutional: Negative for fever and chills.  Eyes: Negative for pain, discharge, redness and itching.  Gastrointestinal: Negative for nausea and vomiting.  Skin: Positive for rash.       Objective:   Physical Exam  CONSTITUTIONAL: Well developed/well nourished HEAD: Normocephalic/atraumatic EYES: EOMI/PERRL SKIN: she has a 1.5 by 1.5 cm ulceration left lower posterior calf.  The previously noted redness over the shin has resolved.  There is no purulence seen.  PSYCH: no abnormalities of mood noted, alert and oriented to situation  Filed Vitals:   12/21/14 1020  BP: 122/2  Pulse: 80  Temp: 98.1 F (36.7 C)  TempSrc: Oral  Resp: 16  Height: 5\' 5"  (1.651 m)  Weight: 168 lb (76.204 kg)  SpO2: 95%       Assessment & Plan:  Ulcer appears to be improving. She grew Pseudomonas. She will finish her Cleocin today. She will continue applying Garamycin ointment. I will check her again in one week.I personally performed the services described in this documentation, which was scribed in my presence. The recorded information has been reviewed and is accurate.

## 2014-12-23 ENCOUNTER — Telehealth (HOSPITAL_COMMUNITY): Payer: Self-pay

## 2014-12-23 NOTE — Telephone Encounter (Signed)
I have called and left a message with Frimy to inquire about participation in Pulmonary Rehab per Dr. Golden Pop referral. Will send letter in mail and follow up.

## 2014-12-28 ENCOUNTER — Ambulatory Visit (INDEPENDENT_AMBULATORY_CARE_PROVIDER_SITE_OTHER): Payer: Medicare Other | Admitting: Emergency Medicine

## 2014-12-28 VITALS — BP 134/80 | HR 80 | Temp 98.8°F | Resp 18 | Ht 65.5 in | Wt 167.0 lb

## 2014-12-28 DIAGNOSIS — L97921 Non-pressure chronic ulcer of unspecified part of left lower leg limited to breakdown of skin: Secondary | ICD-10-CM | POA: Diagnosis not present

## 2014-12-28 DIAGNOSIS — L03116 Cellulitis of left lower limb: Secondary | ICD-10-CM

## 2014-12-28 DIAGNOSIS — M7989 Other specified soft tissue disorders: Secondary | ICD-10-CM | POA: Diagnosis not present

## 2014-12-28 NOTE — Addendum Note (Signed)
Addended by: Arlyss Queen A on: 12/28/2014 09:51 AM   Modules accepted: Orders

## 2014-12-28 NOTE — Progress Notes (Signed)
Subjective:  This chart was scribed for Nena Jordan MD, by Tamsen Roers, at [facility].  This patient was seen in room 10  and the patient's care was started at 9:30 AM.   Chief Complaint  Patient presents with  . Follow-up  . Cellulitis    left leg      Patient ID: [name], female    DOB: [birth year], 81 y.o.   MRN: [id number]  HPI  HPI Comments: [name] is a 81 y.o. female who presents [facility] the [facility] for a follow up regarding cellulitis on her left lower leg which she was seen here for two weeks ago.  Patient denies any redness in the area.  She complaint with using her medication and cleaning it with soap and water.  She is no longer using her oral antibiotics and denies any episodes of diarrhea when she took it.  Her dermatologist will be back in town in 6 days. She has no other complaints or concerns today.  Patient wants to come in next week for another follow up for her rash.    Patient Active Problem List   Diagnosis Date Noted  . Coronary artery calcification 12/05/2014  . Panlobular emphysema 07/25/2014  . Dyspnea and respiratory abnormality 07/25/2014  . Multiple lung nodules on CT 07/25/2014  . Smoking history 07/25/2014  . Pain of right lower leg 05/23/2014  . Fall 05/23/2014  . Gait disorder 04/25/2014  . Memory disorder 04/25/2014  . Essential tremor 04/25/2014  . Advanced directives, counseling/discussion 02/18/2013  . Seizure disorder 10/15/2012  . Cancer of skin, squamous cell 10/15/2012  . Unspecified vitamin D deficiency 10/15/2012  . Other and unspecified hyperlipidemia 10/15/2012  . Edema 09/10/2012  . Restless legs syndrome (RLS) 09/12/2011  . Insomnia, unspecified 09/12/2011  . COPD (chronic obstructive pulmonary disease) 05/23/2011  . Essential hypertension 01/31/2011  . Senile osteoporosis 01/31/2011  . Major depressive disorder, single episode 01/31/2011   Past Medical History    Diagnosis Date  . Restless legs syndrome (RLS) 09/12/2011  . Insomnia, unspecified 09/12/2011  . Acute upper respiratory infections of unspecified site 05/23/2011  . Acute bronchitis 05/23/2011  . Chronic airway obstruction, not elsewhere classified 05/23/2011  . Other and unspecified hyperlipidemia 01/31/2011  . Major depressive disorder, single episode, unspecified 01/31/2011  . Unspecified essential hypertension 01/31/2011  . Mitral valve disorders 01/31/2011  . External hemorrhoids without mention of complication 73/22/0254  . Other emphysema 01/31/2011  . Disturbance of salivary secretion 01/31/2011  . Pain in joint, site unspecified 01/31/2011  . Stiffness of joints, not elsewhere classified, multiple sites 01/31/2011  . Lumbago 01/31/2011  . Senile osteoporosis 01/31/2011  . Other convulsions 01/31/2011  . Dizziness and giddiness 01/31/2011  . Spontaneous ecchymoses 01/31/2011  . Retinal detachment with retinal defect of right eye 2011    right eye twice  . Gait disorder 04/25/2014  . Memory disorder 04/25/2014  . Essential tremor 04/25/2014   Past Surgical History  Procedure Laterality Date  . Tonsillectomy  1941  . Abdominal hysterectomy  06/21/2003    TAH/BSO, omenectomy PSB resect, Stg IC cystadenofibroma  . Cholecystectomy  2005    Dr. Marlou Starks  . Elbow surgery Right 2008    broken   Dr. Apolonio Schneiders  . Rotator cuff repair Right 2012    Dr. Theda Sers  . Squamous cell carcinoma excision Bilateral 2012, 8/14    Mohns on legs   Dr. Sarajane Jews  . Retinal  detachment surgery N/A     two  . Eye surgery    . Coronary artery bypass graft     Allergies  Allergen Reactions  . Sulfa Antibiotics Nausea And Vomiting  . Tetracyclines & Related    Prior to Admission medications   Medication Sig Start Date End Date Taking? Authorizing Yang Rack  albuterol (PROVENTIL HFA;VENTOLIN HFA) 108 (90 BASE) MCG/ACT inhaler Inhale 2 puffs into the lungs every 4 (four) hours as needed for wheezing or  shortness of breath. 09/27/14  Yes Tammy S Parrett, NP  alclomethasone (ACLOVATE) 0.05 % cream Apply 1 application topically as needed.  09/10/13  Yes Historical Taliah Porche, MD  aspirin 81 MG tablet Take 81 mg by mouth daily.   Yes Historical Lynard Postlewait, MD  cholecalciferol (VITAMIN D) 1000 UNITS tablet One daily for Vitamin D supplementation. 10/15/12  Yes Estill Dooms, MD  Fluticasone Furoate-Vilanterol 100-25 MCG/INH AEPB Inhale 1 puff into the lungs daily. 07/25/14  Yes Brand Males, MD  gentamicin cream (GARAMYCIN) 0.1 % Apply 1 application topically 3 (three) times daily. 12/19/14  Yes [name], MD  lamoTRIgine (LAMICTAL) 150 MG tablet TAKE 1 TABLET TWICE DAILY AND TAKE 1/2 TABLET AT LUNCH. 12/14/14  Yes [name], MD  levETIRAcetam (KEPPRA) 500 MG tablet TAKE (1/2) TABLET THREE TIMES DAILY. 12/14/14  Yes [name], MD  memantine (NAMENDA) 10 MG tablet Take 1 tablet (10 mg total) by mouth 2 (two) times daily. 11/25/14  Yes [name], MD  pramipexole (MIRAPEX) 0.25 MG tablet TAKE 1 TABLET ONCE DAILY. 08/25/14  Yes Ward Givens, NP  simvastatin (ZOCOR) 10 MG tablet TAKE 1 TABLET ONCE DAILY. 07/11/14  Yes Tiffany L Reed, DO  tiotropium (SPIRIVA HANDIHALER) 18 MCG inhalation capsule Place 1 capsule (18 mcg total) into inhaler and inhale daily. 09/27/14  Yes Tammy S Parrett, NP  clindamycin (CLEOCIN) 300 MG capsule Take 1 capsule (300 mg total) by mouth 3 (three) times daily. Patient not taking: Reported on 12/28/2014 12/14/14   [name], MD   Social History   Social History  . Marital Status: Divorced    Spouse Name: N/A  . Number of Children: 0  . Years of Education: N/A   Occupational History  . retired Tourist information centre manager for [employer]    Social History Main Topics  . Smoking status: Former Smoker -- 1.00 packs/day for 40 years    Types: Cigarettes    Quit date: 04/08/1988  . Smokeless tobacco: Never Used     Comment: quit in 1990  . Alcohol Use: No  . Drug Use: No    . Sexual Activity: No   Other Topics Concern  . Not on file   Social History Narrative   Lives at [facility]   No children   Divorced   Exercise climbs steps 4 flights daily PT for baalance   Alcohol none   Stopped smoking 1989   Patient drinks 3 large sodas daily.   Patient is right handed.        Review of Systems  Constitutional: Negative for fever and chills.  Eyes: Negative for pain, discharge, redness and itching.  Respiratory: Negative for cough and choking.   Gastrointestinal: Negative for nausea and vomiting.  Skin: Positive for rash and wound.  Neurological: Negative for speech difficulty.       Objective:   Physical Exam CONSTITUTIONAL: Well developed/well nourished HEAD: Normocephalic/atraumatic NEURO: Pt is awake/alert/appropriate, moves all extremitiesx4.  No facial droop.  SKIN: She has a 1 .5 by 1.5 cm ulcer lateral left lower leg.  There is no surrounding erythema of the lower leg.  PSYCH: no abnormalities of mood noted, alert and oriented to situation    Filed Vitals:   12/28/14 0903  BP: 134/80  Pulse: 80  Temp: 98.8 F (37.1 C)  TempSrc: Oral  Resp: 18  Height: 5' 5.5" (1.664 m)  Weight: 167 lb (75.751 kg)  SpO2: 96%          Assessment & Plan:  Cleocin has finished. Repeat culture was done. The wound was cleaned with saline. Xeroform was applied covered by Telfa. She will leave this for 3 days. Following this she will start soap and water cleaning followed by application of Garamycin ointment.I personally performed the services described in this documentation, which was scribed in my presence. The recorded information has been reviewed and is accurate.

## 2014-12-29 ENCOUNTER — Telehealth (HOSPITAL_COMMUNITY): Payer: Self-pay

## 2014-12-29 NOTE — Telephone Encounter (Signed)
I have called and left a message with [name] to inquire about participation in Pulmonary Rehab per Dr. Golden Pop referral. Will send letter in mail and follow up.

## 2014-12-30 LAB — WOUND CULTURE
Gram Stain: NONE SEEN
Gram Stain: NONE SEEN
Gram Stain: NONE SEEN
ORGANISM ID, BACTERIA: NO GROWTH

## 2015-01-03 ENCOUNTER — Ambulatory Visit (INDEPENDENT_AMBULATORY_CARE_PROVIDER_SITE_OTHER): Payer: Medicare Other | Admitting: Cardiology

## 2015-01-03 ENCOUNTER — Encounter: Payer: Self-pay | Admitting: Cardiology

## 2015-01-03 VITALS — BP 140/90 | HR 78 | Ht 65.5 in | Wt 170.8 lb

## 2015-01-03 DIAGNOSIS — I2584 Coronary atherosclerosis due to calcified coronary lesion: Secondary | ICD-10-CM | POA: Diagnosis not present

## 2015-01-03 DIAGNOSIS — R0689 Other abnormalities of breathing: Secondary | ICD-10-CM

## 2015-01-03 DIAGNOSIS — I2510 Atherosclerotic heart disease of native coronary artery without angina pectoris: Secondary | ICD-10-CM

## 2015-01-03 DIAGNOSIS — Z87891 Personal history of nicotine dependence: Secondary | ICD-10-CM

## 2015-01-03 DIAGNOSIS — J438 Other emphysema: Secondary | ICD-10-CM | POA: Diagnosis not present

## 2015-01-03 DIAGNOSIS — Z720 Tobacco use: Secondary | ICD-10-CM

## 2015-01-03 DIAGNOSIS — R0600 Dyspnea, unspecified: Secondary | ICD-10-CM

## 2015-01-03 NOTE — Progress Notes (Addendum)
Cardiology Office Note   Date:  01/04/2015   ID:  [name], DOB [birth year], MRN [id number]  PCP:  Gerrit Heck, MD  Cardiologist:   Candee Furbish, MD   Chief Complaint  Patient presents with  . Coronary Artery Disease    coronary artery calcifaction  . Edema    both feet      History of Present Illness: [name] is a 81 y.o. female [name]) here for the evaluation of coronary artery disease/calcification. She is a former smoker, has COPD followed by Dr. Chase Caller who ordered a CT scan to evaluate lung nodules which picked up coronary artery calcification.  She has shortness of breath with activity which she attributes to deconditioning. Her FEV1 was 75% of predicted. She tells me that she would not 1 any major surgeries performed (bypass for instance).  She does not describe any chest pain, no syncope, no palpitations.  I saw her last several years ago.  She has been battling pseudomonas aeruginosa infection of left leg. She also has chronic lower extremity swelling, pedal edema    Past Medical History  Diagnosis Date  . Restless legs syndrome (RLS) 09/12/2011  . Insomnia, unspecified 09/12/2011  . Acute upper respiratory infections of unspecified site 05/23/2011  . Acute bronchitis 05/23/2011  . Chronic airway obstruction, not elsewhere classified 05/23/2011  . Other and unspecified hyperlipidemia 01/31/2011  . Major depressive disorder, single episode, unspecified 01/31/2011  . Unspecified essential hypertension 01/31/2011  . Mitral valve disorders 01/31/2011  . External hemorrhoids without mention of complication [id number]/8938  . Other emphysema 01/31/2011  . Disturbance of salivary secretion 01/31/2011  . Pain in joint, site unspecified 01/31/2011  . Stiffness of joints, not elsewhere classified, multiple sites 01/31/2011  . Lumbago 01/31/2011  . Senile osteoporosis 01/31/2011  . Other convulsions 01/31/2011  . Dizziness and giddiness 01/31/2011    . Spontaneous ecchymoses 01/31/2011  . Retinal detachment with retinal defect of right eye 2011    right eye twice  . Gait disorder 04/25/2014  . Memory disorder 04/25/2014  . Essential tremor 04/25/2014    Past Surgical History  Procedure Laterality Date  . Tonsillectomy  1941  . Abdominal hysterectomy  06/21/2003    TAH/BSO, omenectomy PSB resect, Stg IC cystadenofibroma  . Cholecystectomy  2005    Dr. Marlou Starks  . Elbow surgery Right 2008    broken   Dr. Apolonio Schneiders  . Rotator cuff repair Right 2012    Dr. Theda Sers  . Squamous cell carcinoma excision Bilateral 2012, 8/14    [place]s on legs   Dr. Sarajane Jews  . Retinal detachment surgery N/A     two  . Eye surgery       Current Outpatient Prescriptions  Medication Sig Dispense Refill  . albuterol (PROVENTIL HFA;VENTOLIN HFA) 108 (90 BASE) MCG/ACT inhaler Inhale 2 puffs into the lungs every 4 (four) hours as needed for wheezing or shortness of breath. 8 g 5  . alclomethasone (ACLOVATE) 0.05 % cream Apply 1 application topically as needed (3 x weekly).     [name] aspirin 81 MG tablet Take 81 mg by mouth daily.    . cholecalciferol (VITAMIN D) 1000 UNITS tablet One daily for Vitamin D supplementation. 100 tablet 5  . Fluticasone Furoate-Vilanterol 100-25 MCG/INH AEPB Inhale 1 puff into the lungs daily. 28 each 0  . gentamicin (GARAMYCIN) 0.3 % ophthalmic ointment Apply 1 application topically 2 (two) times daily. Apply to topically to affected area on leg    .  gentamicin cream (GARAMYCIN) 0.1 % Apply 1 application topically 3 (three) times daily. 30 g 0  . lamoTRIgine (LAMICTAL) 150 MG tablet Take 375 mg by mouth daily. Take one tablet twice daily and half tablet at lunch    . levETIRAcetam (KEPPRA) 500 MG tablet Take 500 mg by mouth 3 (three) times daily.    . memantine (NAMENDA) 10 MG tablet Take 1 tablet (10 mg total) by mouth 2 (two) times daily. 60 tablet 3  . pramipexole (MIRAPEX) 0.25 MG tablet Take 0.25 mg by mouth daily.    . simvastatin  (ZOCOR) 10 MG tablet Take 10 mg by mouth daily.    [name] tiotropium (SPIRIVA) 18 MCG inhalation capsule Place 18 mcg into inhaler and inhale daily.     No current facility-administered medications for this visit.    Allergies:   Sulfa antibiotics and Tetracyclines & related    Social History:  The patient  reports that she quit smoking about 26 years ago. Her smoking use included Cigarettes. She has a 40 pack-year smoking history. She has never used smokeless tobacco. She reports that she does not drink alcohol or use illicit drugs.   Family History:  The patient's family history includes Cancer in her mother; Heart disease in her father; Seizures in her sister.    ROS:  Please see the history of present illness.   Otherwise, review of systems are positive for leg swelling, shortness of breath with activity, depression, back pain, easy bruising, balance issues, walking issues, excessive fatigue, constipation.   All other systems are reviewed and negative.    PHYSICAL EXAM: VS:  BP 140/90 mmHg  Pulse 78  Ht 5' 5.5" (1.664 m)  Wt 170 lb 12.8 oz (77.474 kg)  BMI 27.98 kg/m2  LMP 02/16/1975 , BMI Body mass index is 27.98 kg/(m^2). GEN: Elderly, uses a cane to ambulate, in no acute distress HEENT: normal Neck: no JVD, carotid bruits, or masses Cardiac: RRR; 1/6 systolic murmur, RUSB, no rubs, or gallops,no edema  Respiratory:  Diminished breath sounds/air movement especially left lung fields, kyphosis noted, hyperinflation GI: soft, nontender, nondistended, + BS MS: no deformity or atrophy Skin: Pedal edema, lower extremity edema noted left greater than right, redness of left lateral lower extremity, former Pseudomonas infection. Treated by dermatology. Neuro:  Strength and sensation are intact Psych: euthymic mood, full affect   EKG:  Today: 01/03/15-sinus rhythm, 78, first-degree AV block borderline, PR interval 210 ms, left anterior fascicular block. No other significant abnormalities.  Personally viewed.  CT scan 09/21/14: Chest. Personally viewed, showed to patient  Coronary calcification noted, mitral annular calcification as well.    Recent Labs: 04/25/2014: TSH 1.860 06/22/2014: BUN 29.6*; Creatinine 0.8; Potassium 5.0; Sodium 143 12/14/2014: Hemoglobin 14.3    Lipid Panel    Component Value Date/Time   CHOL 162 02/16/2013   TRIG 75 02/16/2013   HDL 60 02/16/2013   LDLCALC 87 02/16/2013      Wt Readings from Last 3 Encounters:  01/03/15 170 lb 12.8 oz (77.474 kg)  12/28/14 167 lb (75.751 kg)  12/21/14 168 lb (76.204 kg)      Other studies Reviewed: Additional studies/ records that were reviewed today include: CT scan personally viewed, EKG, blood work, office visits. Review of the above records demonstrates: As above   ASSESSMENT AND PLAN:  1.  Coronary artery disease/coronary artery calcification-noted on CT scan. Explained to her the physiology behind this. Given her dyspnea, former smoking habit, COPD, we will go ahead and  proceed with pharmacologic stress test to evaluate for any evidence of ischemia. Continue with statin therapy. Aspirin.  2. Dyspnea-multifactorial, conditioning, COPD, possibly coronary or cardiac related as well. Checking stress test. Checking echo. It looks like she also has mitral annular calcification on CT scan. 1/6 systolic murmur appreciated.  3. COPD-per pulmonary, Dr. Chase Caller. Medications reviewed. FEV1 is 75% of predicted.  4. Lower extremity edema-possible stasis. Compression hose may be helpful. She does not seem terribly excited about using these.  Current medicines are reviewed at length with the patient today.  The patient does not have concerns regarding medicines.  The following changes have been made:  no change  Labs/ tests ordered today include:   Orders Placed This Encounter  Procedures  . Myocardial Perfusion Imaging  . EKG 12-Lead  . Echocardiogram     Disposition:   FU with [facility] in 1 year.  I will follow up with results of stress and echo testing prior to this.  Bobby Rumpf, MD  01/04/2015 4:17 PM    [facility] [address], [place], [place]  [address] Phone: [phone]; Fax: [phone]

## 2015-01-03 NOTE — Patient Instructions (Signed)
Medication Instructions:  The current medical regimen is effective;  continue present plan and medications.  Testing/Procedures: Your physician has requested that you have an echocardiogram. Echocardiography is a painless test that uses sound waves to create images of your heart. It provides your doctor with information about the size and shape of your heart and how well your heart's chambers and valves are working. This procedure takes approximately one hour. There are no restrictions for this procedure.  Your physician has requested that you have a lexiscan myoview. For further information please visit [email or web address]. Please follow instruction sheet, as given.  Follow-Up: Follow up in 1 year with Dr. Marlou Porch.  You will receive a letter in the mail 2 months before you are due.  Please call us when you receive this letter to schedule your follow up appointment.  Thank you for choosing [facility]!!

## 2015-01-04 ENCOUNTER — Encounter: Payer: Self-pay | Admitting: Cardiology

## 2015-01-05 ENCOUNTER — Telehealth: Payer: Self-pay

## 2015-01-05 ENCOUNTER — Encounter: Payer: Self-pay | Admitting: Family Medicine

## 2015-01-05 ENCOUNTER — Ambulatory Visit (INDEPENDENT_AMBULATORY_CARE_PROVIDER_SITE_OTHER): Payer: Medicare Other | Admitting: Family Medicine

## 2015-01-05 VITALS — BP 142/80 | HR 61 | Temp 98.7°F | Resp 18 | Ht 65.5 in | Wt 167.0 lb

## 2015-01-05 DIAGNOSIS — L03116 Cellulitis of left lower limb: Secondary | ICD-10-CM | POA: Diagnosis not present

## 2015-01-05 MED ORDER — CLINDAMYCIN HCL 150 MG PO CAPS: 150.0000 mg | ORAL_CAPSULE | Freq: Three times a day (TID) | ORAL | Status: DC

## 2015-01-05 MED ORDER — MUPIROCIN 2 % EX OINT: 1.0000 "application " | TOPICAL_OINTMENT | Freq: Two times a day (BID) | CUTANEOUS | Status: DC

## 2015-01-05 NOTE — Telephone Encounter (Signed)
Follow up in 1 week

## 2015-01-05 NOTE — Telephone Encounter (Signed)
Pt just saw dr Joseph Art this morning and did not know when she needs to return for follow up

## 2015-01-05 NOTE — Patient Instructions (Signed)

## 2015-01-05 NOTE — Progress Notes (Signed)
° °  Subjective:    Patient ID: [name], female    DOB: [birth year], 81 y.o.   MRN: [id number] This chart was scribed for Robyn Haber, MD by Zola Button, Medical Scribe. This patient was seen in Room 9 and the patient's care was started at 9:28 AM.   HPI HPI Comments: [name] is a 81 y.o. female who presents to the [facility] for a follow-up for cellulitis in her left lower leg. She was put on Cleocin by Dr. Everlene Farrier earlier this month; she has finished her course. Patient notes that her symptoms have not improved much and have remained mostly unchanged.    Review of Systems  Skin: Positive for color change.       Objective:   Physical Exam CONSTITUTIONAL: Well developed/well nourished HEAD: Normocephalic/atraumatic EYES: EOM/PERRL ENMT: Mucous membranes moist NECK: supple no meningeal signs SPINE: entire spine nontender CV: S1/S2 noted, no murmurs/rubs/gallops noted LUNGS: Lungs are clear to auscultation bilaterally, no apparent distress ABDOMEN: soft, nontender, no rebound or guarding GU: no cva tenderness NEURO: Pt is awake/alert, moves all extremitiesx4 EXTREMITIES: pulses normal, full ROM:  Left leg is warm with erythema over the anterior aspect of the lower leg. There is an open biopsy site on the lateral aspect of the lower left leg with white eschar. There is no significant tenderness, and only 1+ edema. SKIN: warm, color normal PSYCH: no abnormalities of mood noted        Assessment & Plan:   By signing my name below, I, Zola Button, attest that this documentation has been prepared under the direction and in the presence of Robyn Haber, MD.  Electronically Signed: Zola Button, Medical Scribe. 01/05/2015. 9:26 AM.  This chart was scribed in my presence and reviewed by me personally.    ICD-9-CM ICD-10-CM   1. Cellulitis of leg, left 682.6 L03.116 clindamycin (CLEOCIN) 150 MG capsule     mupirocin ointment (BACTROBAN) 2 %    Healing is very slow secondary to poor circulation. I think overall patient is improving and she's having no side effects from the clindamycin.  Signed, Robyn Haber, MD

## 2015-01-06 NOTE — Telephone Encounter (Signed)
Left detailed message on voicemail.  

## 2015-01-10 ENCOUNTER — Telehealth (HOSPITAL_COMMUNITY): Payer: Self-pay

## 2015-01-10 NOTE — Telephone Encounter (Signed)
Called patient to discuss Pulmonary Rehab.  Patient is responsible for a $20 co pay each visit. Patient also does not have reliable transportation. Patient needs to think about it and she states she will call us back.

## 2015-01-11 ENCOUNTER — Encounter (HOSPITAL_COMMUNITY): Payer: Medicare Other

## 2015-01-11 ENCOUNTER — Other Ambulatory Visit (HOSPITAL_COMMUNITY): Payer: Medicare Other

## 2015-01-19 ENCOUNTER — Other Ambulatory Visit: Payer: Self-pay

## 2015-01-19 ENCOUNTER — Ambulatory Visit (HOSPITAL_COMMUNITY): Payer: Medicare Other | Attending: Cardiology

## 2015-01-19 ENCOUNTER — Ambulatory Visit (HOSPITAL_BASED_OUTPATIENT_CLINIC_OR_DEPARTMENT_OTHER): Payer: Medicare Other

## 2015-01-19 DIAGNOSIS — I059 Rheumatic mitral valve disease, unspecified: Secondary | ICD-10-CM | POA: Diagnosis not present

## 2015-01-19 DIAGNOSIS — I371 Nonrheumatic pulmonary valve insufficiency: Secondary | ICD-10-CM | POA: Insufficient documentation

## 2015-01-19 DIAGNOSIS — I517 Cardiomegaly: Secondary | ICD-10-CM | POA: Insufficient documentation

## 2015-01-19 DIAGNOSIS — I2510 Atherosclerotic heart disease of native coronary artery without angina pectoris: Secondary | ICD-10-CM | POA: Insufficient documentation

## 2015-01-19 DIAGNOSIS — I071 Rheumatic tricuspid insufficiency: Secondary | ICD-10-CM | POA: Diagnosis not present

## 2015-01-19 DIAGNOSIS — I2584 Coronary atherosclerosis due to calcified coronary lesion: Secondary | ICD-10-CM

## 2015-01-19 DIAGNOSIS — R0600 Dyspnea, unspecified: Secondary | ICD-10-CM | POA: Insufficient documentation

## 2015-01-19 DIAGNOSIS — I358 Other nonrheumatic aortic valve disorders: Secondary | ICD-10-CM | POA: Diagnosis not present

## 2015-01-19 LAB — MYOCARDIAL PERFUSION IMAGING
CHL CUP NUCLEAR SDS: 2
CHL CUP NUCLEAR SRS: 6
CHL CUP RESTING HR STRESS: 76 {beats}/min
LV sys vol: 11 mL
LVDIAVOL: 66 mL
Peak HR: 86 {beats}/min
RATE: 0.31
SSS: 8
TID: 0.8

## 2015-01-19 MED ORDER — TECHNETIUM TC 99M SESTAMIBI GENERIC - CARDIOLITE
10.5000 | Freq: Once | INTRAVENOUS | Status: AC | PRN
  Administered 2015-01-19: 11 via INTRAVENOUS

## 2015-01-19 MED ORDER — REGADENOSON 0.4 MG/5ML IV SOLN
0.4000 mg | Freq: Once | INTRAVENOUS | Status: AC
  Administered 2015-01-19: 0.4 mg via INTRAVENOUS

## 2015-01-19 MED ORDER — TECHNETIUM TC 99M SESTAMIBI GENERIC - CARDIOLITE
30.6000 | Freq: Once | INTRAVENOUS | Status: AC | PRN
  Administered 2015-01-19: 31 via INTRAVENOUS

## 2015-01-25 ENCOUNTER — Telehealth: Payer: Self-pay | Admitting: Cardiology

## 2015-01-25 NOTE — Telephone Encounter (Signed)
F/u       Pt calling back stating she spoke to [facility] this morning and just has one more question for her. Please call back and advise.

## 2015-01-25 NOTE — Telephone Encounter (Signed)
New message ° ° ° ° ° °Returning a nurses call to get echo results °

## 2015-01-25 NOTE — Telephone Encounter (Signed)
I spoke with the patient and made her aware of Dr. Marlou Porch recommendations. She verbalizes understanding.

## 2015-01-25 NOTE — Telephone Encounter (Signed)
I spoke with the patient. She is aware of her echo results.

## 2015-01-25 NOTE — Telephone Encounter (Signed)
Continue with conservative mgt. Dependent edema. Compression, Try to avoid high salt. Watch fluid intake.  According to my past notes, she has had chronic lower ext edema.  Martha Furbish, MD

## 2015-01-25 NOTE — Telephone Encounter (Signed)
I spoke with the patient. She states that she has had bilateral foot/ ankle swelling for about a month. She has compression stockings that she is going to start wearing. Her edema is usually better in the mornings, but was no better this morning from last night. She does monitor her weight frequently. Her baseline weight is usually 164 lbs, she was 169 lbs last night, and 167 lbs this morning. She is not on a diuretic and has never been on one in the past per her report. I advised I would forward to Dr. Marlou Porch for review. The patient is agreeable.

## 2015-02-19 ENCOUNTER — Other Ambulatory Visit: Payer: Self-pay | Admitting: Adult Health

## 2015-02-19 ENCOUNTER — Other Ambulatory Visit: Payer: Self-pay | Admitting: Internal Medicine

## 2015-02-20 ENCOUNTER — Other Ambulatory Visit: Payer: Self-pay | Admitting: Internal Medicine

## 2015-03-13 ENCOUNTER — Other Ambulatory Visit: Payer: Self-pay | Admitting: Nurse Practitioner

## 2015-03-20 ENCOUNTER — Other Ambulatory Visit: Payer: Self-pay | Admitting: Internal Medicine

## 2015-03-23 ENCOUNTER — Inpatient Hospital Stay: Admission: RE | Admit: 2015-03-23 | Payer: Medicare Other | Source: Ambulatory Visit

## 2015-03-24 ENCOUNTER — Telehealth (HOSPITAL_COMMUNITY): Payer: Self-pay

## 2015-03-24 NOTE — Telephone Encounter (Signed)
I have called and left a message with [name] to inquire about participation in Pulmonary Rehab per Dr. Ramaswamy's referral. Will send letter in mail and follow up. 

## 2015-03-24 NOTE — Telephone Encounter (Signed)
Spoke with patient in regards to Pulmonary Rehab. Patient states that it would be more convenient for her to do the program where she resides at [facility]. Patient wants to talk this over with Dr. Chase Caller in January when she has her office visit. Patient states that she will follow up with me in the future if she has any more questions.

## 2015-03-26 ENCOUNTER — Other Ambulatory Visit: Payer: Self-pay | Admitting: Neurology

## 2015-03-30 ENCOUNTER — Ambulatory Visit (INDEPENDENT_AMBULATORY_CARE_PROVIDER_SITE_OTHER)
Admission: RE | Admit: 2015-03-30 | Discharge: 2015-03-30 | Disposition: A | Discharge status: Home / Self Care | Payer: Medicare Other | Source: Ambulatory Visit | Attending: Internal Medicine | Admitting: Internal Medicine

## 2015-03-30 DIAGNOSIS — R918 Other nonspecific abnormal finding of lung field: Secondary | ICD-10-CM | POA: Diagnosis not present

## 2015-04-02 ENCOUNTER — Telehealth: Payer: Self-pay | Admitting: Internal Medicine

## 2015-04-02 NOTE — Telephone Encounter (Signed)
[name]  Please give results as follow  IMPRESSION: 1. Stable RIGHT middle lobe pulmonary nodule. Recommend follow-up CT in 12 months per Fleischner criteria. 2. Decreased density of LEFT upper lobe pulmonary nodule. 3. Near Complete resolution RIGHT lower lobe nodularity within azygoesophageal recess.   Electronically Signed By: Suzy Bouchard M.D. On: 03/30/2015 17:25  PLAN - next OV per last OV plan - order CT chest wo contrast in 1 year for nodules  Dr. Brand Males, M.D., [facility].C.P Pulmonary and Critical Care Medicine Staff Physician [facility] Pager: [phone], If no answer or between  15:00h - 7:00h: call [phone]  04/02/2015 2:55 AM

## 2015-04-20 ENCOUNTER — Ambulatory Visit (INDEPENDENT_AMBULATORY_CARE_PROVIDER_SITE_OTHER): Payer: Medicare Other | Admitting: Internal Medicine

## 2015-04-20 ENCOUNTER — Encounter: Payer: Self-pay | Admitting: Internal Medicine

## 2015-04-20 VITALS — BP 138/92 | HR 94 | Ht 65.0 in | Wt 168.2 lb

## 2015-04-20 DIAGNOSIS — J431 Panlobular emphysema: Secondary | ICD-10-CM | POA: Diagnosis not present

## 2015-04-20 DIAGNOSIS — R918 Other nonspecific abnormal finding of lung field: Secondary | ICD-10-CM | POA: Diagnosis not present

## 2015-04-20 NOTE — Progress Notes (Signed)
Subjective:     Patient ID: [name], female   DOB: [birth year], 82 y.o.   MRN: [id number]  HPI   PCP GREEN, Martha Spare, MD   HPI  IOV 07/25/2014   82 year old female accompanied by her 2 sisters who are 6 and 10 years apart. They're all childless. She is an ex-smoker. Quit in the 1990s. Problems   Dyspnea:: he has multi-year diagnosis of dyspnea with emphysema diagnosed over 15 years ago. She's been on Spiriva since then. Dyspnea has been stable but in the last several months is progressive. It is still mild but present on exertion and relieved by rest. Worsening started sometime before she took a fall and since then has been a little bit more sedentary. Also associated with worsening dyspnea is worsening aging related loss of mobility. She now walks with a cane for the last 1 year. She's moved from an apartment to an assisted living facility. There is no associated cough or wheezing.  Multiple lung nodules: She had a total abdominal hysterectomy and oophorectomy in 10 and years ago. Since then she is on observation. Apparently her CTA-125 is borderline elevated. Most recently there was a slight increase but still only borderline elevated. This resulted in investigative imaging. CT scan of the chest mid March 2016 showed multiple lung nodules bilaterally. Largest of this is a left lower lobe subpleural 1.5 cm nodule. Radiologist felt this to be of indeterminate for lung cancer or metastasis    09/27/2014 Follow up :Dyspnea/lung nodules  CT results.  Pt returns for 2 month follow up  Seen last ov for pulmonary consult for dyspnea and lung nodules.  She was on Spiriva. Started on BREO but did not like this.  Has not had a pulmonary function test in the past.  Has had a Pneumovax and Prevnar vaccine. Patient had a CT chest in March 2016 that showed multiple lung nodules bilaterally  CT chest was repeated on June 15 and showed majority of pulmonary nodules are stable with resolution of  tiny nodule in lingula ,  Discussed repeat CT in 6 months. She is a former smoker She denies any unintentional weight loss. On CT chest. Several incidental findings were noted with a stable hepatic hemangioma, scattered atherosclerotic calcification, chronic calcific pancreatitis, and chronic T10 compression fracture with thoracic kyphosis. We discussed all of these incidental findings and suggested that she follow with her primary care physician in the near future to discuss if any further testing is indicated.  She denies any chest pain, orthopnea, PND, leg swelling or hemoptysis.     OV 12/05/2014  Chief Complaint  Patient presents with  . Follow-up    Pt here after PFT. Pt states her breathing is unchanged. Pt mild dry cough, DOE, chest tighenss when SOB.      Follow-up  - Emphysema associated with shortness of breath: She is accompanied by her sister [name]. Her sister [name] did not come with her today. She has a clinical diagnosis of emphysema. She was already on Spiriva. We added Dulera but she reported that she did not tolerated well. She's back on Spiriva. She still having shortness of breath when she walks one fourth of a mile to the dining room. She gets mildly dyspneic when she climbs one flight of stairs but no dyspnea for changing clothes. She is frustrated by this dyspnea. She had pulm function test today that I personally visualized the image. Spirometry is normal. Total lung capacity is normal. DLCO is reduced moderately  at 63%/25.6. She seems out of proportion dyspnea. She seems open to the idea of pulmonary rehabilitation   She had a CT scan of the chest June 2016 that showed multiple lung nodules. She has follow-up CT scan of the chest scheduled for December 2016  Incidental finding of coronary artery calcification on the CT scan of the chest June 2016: She does not have any chest pain. She reports having seen [name] of [facility] cardiology 8 years ago. Apparently  cardiac stress test was normal at that time. She was also diagnosed to have mitral valve prolapse. She's not followed up with him since then. She is a little bit worried about the calcification and is open to seeing cardiology again particularly in the context of out of proportion dyspnea   OV 04/20/2015  Chief Complaint  Patient presents with  . Follow-up    Pt here after CT scan. Pt states she f/u with [name] and had an echo and stress test. Pt states her breathing is unchanged since last OV. Pt c/o intermittent wheezing. Pt denies cough and CP/tightness.    [name] presents for follow-up. She is here with his sister [name]. Sister [name] did not come.  For emphysema and dyspnea: She is now on triple inhaler therapy. She still has exertional dyspnea. Reviewed cardiology notes. She had echocardiogram that was normal. She had a nuclear medicine stress test that was low risk. We referred her for pulmonary rehabilitation but she wants to try out doing pulmonary exercises with her physical therapist [facility]. She is also started trying tai chi to improve her balance.  Lung nodules multiple: She had CT chest in December 2016 which shows significant improvement. Next CT will be in one year December 2017.  There are no other interim complaints   Current outpatient prescriptions:  .  albuterol (PROVENTIL HFA;VENTOLIN HFA) 108 (90 BASE) MCG/ACT inhaler, Inhale 2 puffs into the lungs every 4 (four) hours as needed for wheezing or shortness of breath., Disp: 8 g, Rfl: 5 .  alclomethasone (ACLOVATE) 0.05 % cream, Apply 1 application topically as needed (3 x weekly). , Disp: , Rfl:  .  aspirin 81 MG tablet, Take 81 mg by mouth daily., Disp: , Rfl:  .  cholecalciferol (VITAMIN D) 1000 UNITS tablet, One daily for Vitamin D supplementation., Disp: 100 tablet, Rfl: 5 .  lamoTRIgine (LAMICTAL) 150 MG tablet, Take 375 mg by mouth daily. Take one tablet twice daily and half  tablet at lunch, Disp: , Rfl:  .  levETIRAcetam (KEPPRA) 500 MG tablet, Take 250 mg by mouth 3 (three) times daily. , Disp: , Rfl:  .  memantine (NAMENDA) 10 MG tablet, TAKE 1 TABLET TWICE DAILY., Disp: 60 tablet, Rfl: 1 .  pramipexole (MIRAPEX) 0.25 MG tablet, TAKE 1 TABLET ONCE DAILY., Disp: 90 tablet, Rfl: 0 .  simvastatin (ZOCOR) 10 MG tablet, Take 10 mg by mouth daily., Disp: , Rfl:  .  SPIRIVA HANDIHALER 18 MCG inhalation capsule, INHALE CONTENTS OF ONE CAPSULE ONCE DAILY FOR COPD., Disp: 30 capsule, Rfl: 0 .  Fluticasone Furoate-Vilanterol 100-25 MCG/INH AEPB, Inhale 1 puff into the lungs daily. (Patient not taking: Reported on 04/20/2015), Disp: 28 each, Rfl: 0  Immunization History  Administered Date(s) Administered  . Influenza Split 01/06/2014  . Influenza Whole 01/07/2012, 01/06/2013  . Influenza,inj,Quad PF,36+ Mos 12/21/2014  . Pneumococcal Polysaccharide-23 04/08/2004  . Td 04/08/2002  . Zoster 04/08/2008    Allergies  Allergen Reactions  .  Sulfa Antibiotics Nausea And Vomiting  . Tetracyclines & Related        Review of Systems Per hpi    Objective:   Physical Exam  Constitutional: She is oriented to person, place, and time. She appears well-developed and well-nourished. No distress.  HENT:  Head: Normocephalic and atraumatic.  Right Ear: External ear normal.  Left Ear: External ear normal.  Mouth/Throat: Oropharynx is clear and moist. No oropharyngeal exudate.  Eyes: Conjunctivae and EOM are normal. Pupils are equal, round, and reactive to light. Right eye exhibits no discharge. Left eye exhibits no discharge. No scleral icterus.  Neck: Normal range of motion. Neck supple. No JVD present. No tracheal deviation present. No thyromegaly present.  Cardiovascular: Normal rate, regular rhythm, normal heart sounds and intact distal pulses.  Exam reveals no gallop and no friction rub.   No murmur heard. Pulmonary/Chest: Effort normal and breath sounds normal. No  respiratory distress. She has no wheezes. She has no rales. She exhibits no tenderness.  Abdominal: Soft. Bowel sounds are normal. She exhibits no distension and no mass. There is no tenderness. There is no rebound and no guarding.  Musculoskeletal: Normal range of motion. She exhibits no edema or tenderness.  Significant scoliosis present.  Lymphadenopathy:    She has no cervical adenopathy.  Neurological: She is alert and oriented to person, place, and time. She has normal reflexes. No cranial nerve deficit. She exhibits normal muscle tone. Coordination normal.  Skin: Skin is warm and dry. No rash noted. She is not diaphoretic. No erythema. No pallor.  Psychiatric: She has a normal mood and affect. Her behavior is normal. Judgment and thought content normal.  Vitals reviewed.   Filed Vitals:   04/20/15 1642  BP: 138/92  Pulse: 94  Height: '5\' 5"'$  (1.651 m)  Weight: 168 lb 3.2 oz (76.295 kg)  SpO2: 96%        Assessment:       ICD-9-CM ICD-10-CM   1. Multiple lung nodules on CT 793.19 R91.8 CT Chest Wo Contrast  2. Panlobular emphysema ([place]) 492.8 J43.1        Plan:      #Multiple lung nodules on CT - This is significantly improved as of December 2016 - Do follow-up CT chest without contrast in December 2017  #Emphysema -Continue inhalers as before - To make further improvement in shortness of breath recommended physical therapy/pulmonary rehabilitation with your physical therapist at [facility]  - We will do a prescription for this  - If this method does not work then we can refer to [facility] pulmonary rehabilitation program  #Follow-up - 6 months to report progress with shortness of breath   Dr. Brand Males, M.D., [facility].C.P Pulmonary and Critical Care Medicine Staff Physician [place] [place] Pager: [phone], If no answer or between  15:00h - 7:00h: call [phone]  04/20/2015 5:17 PM

## 2015-04-20 NOTE — Patient Instructions (Signed)
ICD-9-CM ICD-10-CM   1. Multiple lung nodules on CT 793.19 R91.8   2. Panlobular emphysema ([place]) 492.8 J43.1    #Multiple lung nodules on CT - This is significantly improved as of December 2016 - Do follow-up CT chest without contrast in December 2017  #Emphysema -Continue inhalers as before - To make further improvement in shortness of breath recommended physical therapy/pulmonary rehabilitation with your physical therapist at [facility]  - We will do a prescription for this  - If this method does not work then we can interfere to [facility] pulmonary rehabilitation program  #Follow-up - 6 months to report progress with shortness of breath

## 2015-04-24 ENCOUNTER — Other Ambulatory Visit: Payer: Self-pay | Admitting: Nurse Practitioner

## 2015-04-28 ENCOUNTER — Ambulatory Visit (INDEPENDENT_AMBULATORY_CARE_PROVIDER_SITE_OTHER): Payer: Medicare Other | Admitting: Neurology

## 2015-04-28 ENCOUNTER — Encounter: Payer: Self-pay | Admitting: Neurology

## 2015-04-28 VITALS — BP 144/90 | HR 103 | Ht 64.0 in | Wt 165.5 lb

## 2015-04-28 DIAGNOSIS — G2581 Restless legs syndrome: Secondary | ICD-10-CM

## 2015-04-28 DIAGNOSIS — R413 Other amnesia: Secondary | ICD-10-CM

## 2015-04-28 DIAGNOSIS — G40909 Epilepsy, unspecified, not intractable, without status epilepticus: Secondary | ICD-10-CM

## 2015-04-28 DIAGNOSIS — R269 Unspecified abnormalities of gait and mobility: Secondary | ICD-10-CM

## 2015-04-28 DIAGNOSIS — G250 Essential tremor: Secondary | ICD-10-CM | POA: Diagnosis not present

## 2015-04-28 MED ORDER — PRAMIPEXOLE DIHYDROCHLORIDE 0.25 MG PO TABS: 0.2500 mg | ORAL_TABLET | Freq: Every day | ORAL | Status: DC

## 2015-04-28 MED ORDER — MEMANTINE HCL 10 MG PO TABS: 10.0000 mg | ORAL_TABLET | Freq: Two times a day (BID) | ORAL | Status: DC

## 2015-04-28 NOTE — Patient Instructions (Signed)
Fall Prevention in the Home  Falls can cause injuries and can affect people from all age groups. There are many simple things that you can do to make your home safe and to help prevent falls. WHAT CAN I DO ON THE OUTSIDE OF MY HOME?  Regularly repair the edges of walkways and driveways and fix any cracks.  Remove high doorway thresholds.  Trim any shrubbery on the main path into your home.  Use bright outdoor lighting.  Clear walkways of debris and clutter, including tools and rocks.  Regularly check that handrails are securely fastened and in good repair. Both sides of any steps should have handrails.  Install guardrails along the edges of any raised decks or porches.  Have leaves, snow, and ice cleared regularly.  Use sand or salt on walkways during winter months.  In the garage, clean up any spills right away, including grease or oil spills. WHAT CAN I DO IN THE BATHROOM?  Use night lights.  Install grab bars by the toilet and in the tub and shower. Do not use towel bars as grab bars.  Use non-skid mats or decals on the floor of the tub or shower.  If you need to sit down while you are in the shower, use a plastic, non-slip stool..  Keep the floor dry. Immediately clean up any water that spills on the floor.  Remove soap buildup in the tub or shower on a regular basis.  Attach bath mats securely with double-sided non-slip rug tape.  Remove throw rugs and other tripping hazards from the floor. WHAT CAN I DO IN THE BEDROOM?  Use night lights.  Make sure that a bedside light is easy to reach.  Do not use oversized bedding that drapes onto the floor.  Have a firm chair that has side arms to use for getting dressed.  Remove throw rugs and other tripping hazards from the floor. WHAT CAN I DO IN THE KITCHEN?   Clean up any spills right away.  Avoid walking on wet floors.  Place frequently used items in easy-to-reach places.  If you need to reach for something  above you, use a sturdy step stool that has a grab bar.  Keep electrical cables out of the way.  Do not use floor polish or wax that makes floors slippery. If you have to use wax, make sure that it is non-skid floor wax.  Remove throw rugs and other tripping hazards from the floor. WHAT CAN I DO IN THE STAIRWAYS?  Do not leave any items on the stairs.  Make sure that there are handrails on both sides of the stairs. Fix handrails that are broken or loose. Make sure that handrails are as long as the stairways.  Check any carpeting to make sure that it is firmly attached to the stairs. Fix any carpet that is loose or worn.  Avoid having throw rugs at the top or bottom of stairways, or secure the rugs with carpet tape to prevent them from moving.  Make sure that you have a light switch at the top of the stairs and the bottom of the stairs. If you do not have them, have them installed. WHAT ARE SOME OTHER FALL PREVENTION TIPS?  Wear closed-toe shoes that fit well and support your feet. Wear shoes that have rubber soles or low heels.  When you use a stepladder, make sure that it is completely opened and that the sides are firmly locked. Have someone hold the ladder while you   are using it. Do not climb a closed stepladder.  Add color or contrast paint or tape to grab bars and handrails in your home. Place contrasting color strips on the first and last steps.  Use mobility aids as needed, such as canes, walkers, scooters, and crutches.  Turn on lights if it is dark. Replace any light bulbs that burn out.  Set up furniture so that there are clear paths. Keep the furniture in the same spot.  Fix any uneven floor surfaces.  Choose a carpet design that does not hide the edge of steps of a stairway.  Be aware of any and all pets.  Review your medicines with your healthcare provider. Some medicines can cause dizziness or changes in blood pressure, which increase your risk of falling. Talk  with your health care provider about other ways that you can decrease your risk of falls. This may include working with a physical therapist or trainer to improve your strength, balance, and endurance.   This information is not intended to replace advice given to you by your health care provider. Make sure you discuss any questions you have with your health care provider.   Document Released: 03/15/2002 Document Revised: 08/09/2014 Document Reviewed: 04/29/2014 Elsevier Interactive Patient Education 2016 [employer]vier Inc.  

## 2015-04-28 NOTE — Progress Notes (Signed)
Reason for visit:  Memory disturbance  [name] is an 82 y.o. female  History of present illness:   [name] is an 82 year old right-handed white female with a history of a mild memory disturbance, minimum cognitive impairment. The patient is on Namenda at this time, tolerating the medication well. She indicates that her memory issues have been stable since last seen. She has a history of seizures, currently on Lamictal and Keppra. She has not had a seizure since 2011. She does not operate a motor vehicle. She lives at [facility], she currently is in physical therapy for her balance issues, she has not had any recent falls. She is also getting involved with Tai Chi classes. The patient has restless leg syndrome, if she misses a dose of Mirapex, this severely worsens her symptoms. She does well as long she takes her medication.  Past Medical History  Diagnosis Date  . Restless legs syndrome (RLS) 09/12/2011  . Insomnia, unspecified 09/12/2011  . Acute upper respiratory infections of unspecified site 05/23/2011  . Acute bronchitis 05/23/2011  . Chronic airway obstruction, not elsewhere classified 05/23/2011  . Other and unspecified hyperlipidemia 01/31/2011  . Major depressive disorder, single episode, unspecified ([place]) 01/31/2011  . Unspecified essential hypertension 01/31/2011  . Mitral valve disorders 01/31/2011  . External hemorrhoids without mention of complication 16/01/9603  . Other emphysema ([place]) 01/31/2011  . Disturbance of salivary secretion 01/31/2011  . Pain in joint, site unspecified 01/31/2011  . Stiffness of joints, not elsewhere classified, multiple sites 01/31/2011  . Lumbago 01/31/2011  . Senile osteoporosis 01/31/2011  . Other convulsions 01/31/2011  . Dizziness and giddiness 01/31/2011  . Spontaneous ecchymoses 01/31/2011  . Retinal detachment with retinal defect of right eye 2011    right eye twice  . Gait disorder 04/25/2014  . Memory disorder  04/25/2014  . Essential tremor 04/25/2014    Past Surgical History  Procedure Laterality Date  . Tonsillectomy  1941  . Abdominal hysterectomy  06/21/2003    TAH/BSO, omenectomy PSB resect, Stg IC cystadenofibroma  . Cholecystectomy  2005    Dr. Marlou Starks  . Elbow surgery Right 2008    broken   Dr. Apolonio Schneiders  . Rotator cuff repair Right 2012    Dr. Theda Sers  . Squamous cell carcinoma excision Bilateral 2012, 8/14    [place] on legs   Dr. Sarajane Jews  . Retinal detachment surgery N/A     two  . Eye surgery      Family History  Problem Relation Age of Onset  . Heart disease Father     CHF  . Cancer Mother     breast  . Seizures Sister     Social history:  reports that she quit smoking about 27 years ago. Her smoking use included Cigarettes. She has a 40 pack-year smoking history. She has never used smokeless tobacco. She reports that she does not drink alcohol or use illicit drugs.    Allergies  Allergen Reactions  . Sulfa Antibiotics Nausea And Vomiting  . Tetracyclines & Related     Medications:  Prior to Admission medications   Medication Sig Start Date End Date Taking? Authorizing Provider  albuterol (PROVENTIL HFA;VENTOLIN HFA) 108 (90 BASE) MCG/ACT inhaler Inhale 2 puffs into the lungs every 4 (four) hours as needed for wheezing or shortness of breath. 09/27/14  Yes Tammy S Parrett, NP  alclomethasone (ACLOVATE) 0.05 % cream Apply 1 application topically as needed (3 x weekly).  09/10/13  Yes  Historical Provider, MD  aspirin 81 MG tablet Take 81 mg by mouth daily.   Yes Historical Provider, MD  cholecalciferol (VITAMIN D) 1000 UNITS tablet One daily for Vitamin D supplementation. 10/15/12  Yes Estill Dooms, MD  Fluticasone Furoate-Vilanterol 100-25 MCG/INH AEPB Inhale 1 puff into the lungs daily. 07/25/14  Yes Brand Males, MD  lamoTRIgine (LAMICTAL) 150 MG tablet Take 375 mg by mouth daily. Take one tablet twice daily and half tablet at lunch   Yes Historical Provider, MD    levETIRAcetam (KEPPRA) 500 MG tablet Take 250 mg by mouth 3 (three) times daily.    Yes Historical Provider, MD  memantine (NAMENDA) 10 MG tablet TAKE 1 TABLET TWICE DAILY. 03/26/15  Yes [name], MD  pramipexole (MIRAPEX) 0.25 MG tablet TAKE 1 TABLET ONCE DAILY. 02/19/15  Yes [name], MD  simvastatin (ZOCOR) 10 MG tablet Take 10 mg by mouth daily.   Yes Historical Provider, MD  SPIRIVA HANDIHALER 18 MCG inhalation capsule INHALE CONTENTS OF ONE CAPSULE ONCE DAILY FOR COPD. 03/13/15  Yes Man Mast X, NP    ROS:  Out of a complete 14 system review of symptoms, the patient complains only of the following symptoms, and all other reviewed systems are negative.   Wheezing, shortness of breath  Swollen abdomen, constipation  Joint pain, back pain, achy muscles, neck stiffness  Memory loss, seizures  Mild depression  Blood pressure 144/90, pulse 103, height '5\' 4"'$  (1.626 m), weight 165 lb 8 oz (75.07 kg), last menstrual period 02/16/1975.  Physical Exam  General: The patient is alert and cooperative at the time of the examination.  Skin: No significant peripheral edema is noted.   Neurologic Exam  Mental status: The patient is alert and oriented x 3 at the time of the examination. The patient has apparent normal recent and remote memory, with an apparently normal attention span and concentration ability. Mini-Mental status examination done today shows a total score of 29/30. The patient is able to name 20 animals in 30 seconds.   Cranial nerves: Facial symmetry is present. Speech is normal, no aphasia or dysarthria is noted. Extraocular movements are full. Visual fields are full.  Motor: The patient has good strength in all 4 extremities.  Sensory examination: Soft touch sensation is symmetric on the face, arms, and legs.  Coordination: The patient has good finger-nose-finger and heel-to-shin bilaterally.  Gait and station: The patient has a normal gait. Tandem gait is  unsteady. Romberg is negative. No drift is seen.  Reflexes: Deep tendon reflexes are symmetric , but are depressed.   Assessment/Plan:   1. Minimum cognitive impairment   2. Gait disorder   3. Seizure disorder, well controlled   4. Restless leg syndrome   The patient is doing relatively well at this point. She was given a prescription for the Mirapex and for the Namenda. She will follow-up in 9 months. Hopefully, the physical therapy may help her gait issues. The patient does not use a cane for ambulation, she has not had any recent falls.  Jill Alexanders MD 04/28/2015 3:47 PM  [name] [address] [place], [place] [address]  Phone [phone] Fax [phone]

## 2015-06-12 ENCOUNTER — Other Ambulatory Visit: Payer: Self-pay | Admitting: Nurse Practitioner

## 2015-06-13 ENCOUNTER — Ambulatory Visit: Payer: Medicare Other | Admitting: Nurse Practitioner

## 2015-06-23 ENCOUNTER — Ambulatory Visit: Payer: Medicare Other | Admitting: Nurse Practitioner

## 2015-06-27 ENCOUNTER — Encounter: Payer: Self-pay | Admitting: Nurse Practitioner

## 2015-06-27 ENCOUNTER — Ambulatory Visit (INDEPENDENT_AMBULATORY_CARE_PROVIDER_SITE_OTHER): Payer: Medicare Other | Admitting: Nurse Practitioner

## 2015-06-27 ENCOUNTER — Other Ambulatory Visit: Payer: Self-pay | Admitting: Neurology

## 2015-06-27 VITALS — BP 132/84 | HR 76 | Ht 65.0 in | Wt 160.0 lb

## 2015-06-27 DIAGNOSIS — R140 Abdominal distension (gaseous): Secondary | ICD-10-CM | POA: Diagnosis not present

## 2015-06-27 DIAGNOSIS — E559 Vitamin D deficiency, unspecified: Secondary | ICD-10-CM | POA: Diagnosis not present

## 2015-06-27 DIAGNOSIS — Z01419 Encounter for gynecological examination (general) (routine) without abnormal findings: Secondary | ICD-10-CM

## 2015-06-27 DIAGNOSIS — N9489 Other specified conditions associated with female genital organs and menstrual cycle: Secondary | ICD-10-CM | POA: Diagnosis not present

## 2015-06-27 DIAGNOSIS — Z8543 Personal history of malignant neoplasm of ovary: Secondary | ICD-10-CM

## 2015-06-27 DIAGNOSIS — E7800 Pure hypercholesterolemia, unspecified: Secondary | ICD-10-CM | POA: Diagnosis not present

## 2015-06-27 DIAGNOSIS — N898 Other specified noninflammatory disorders of vagina: Secondary | ICD-10-CM

## 2015-06-27 LAB — COMPREHENSIVE METABOLIC PANEL
ALK PHOS: 101 U/L (ref 33–130)
ALT: 13 U/L (ref 6–29)
AST: 18 U/L (ref 10–35)
Albumin: 4.4 g/dL (ref 3.6–5.1)
BILIRUBIN TOTAL: 0.4 mg/dL (ref 0.2–1.2)
BUN: 24 mg/dL (ref 7–25)
CO2: 28 mmol/L (ref 20–31)
CREATININE: 1.02 mg/dL — AB (ref 0.60–0.88)
Calcium: 9.9 mg/dL (ref 8.6–10.4)
Chloride: 105 mmol/L (ref 98–110)
Glucose, Bld: 72 mg/dL (ref 65–99)
Potassium: 5 mmol/L (ref 3.5–5.3)
SODIUM: 141 mmol/L (ref 135–146)
TOTAL PROTEIN: 7.1 g/dL (ref 6.1–8.1)

## 2015-06-27 LAB — LIPID PANEL
Cholesterol: 249 mg/dL — ABNORMAL HIGH (ref 125–200)
HDL: 70 mg/dL (ref >=46)
LDL Cholesterol: 160 mg/dL — ABNORMAL HIGH (ref <130)
Total CHOL/HDL Ratio: 3.6 Ratio (ref <=5.0)
Triglycerides: 97 mg/dL (ref <150)
VLDL: 19 mg/dL (ref <30)

## 2015-06-27 MED ORDER — METRONIDAZOLE 0.75 % VA GEL: 1.0000 | Freq: Every day | VAGINAL | Status: DC

## 2015-06-27 NOTE — Progress Notes (Signed)
Encounter reviewed by Dr. Brook Amundson C. Silva.  

## 2015-06-27 NOTE — Progress Notes (Signed)
Patient ID: [name], female   DOB: Oct 07, 1932, 82 y.o.   MRN: [id number]  82 y.o. [id number] Divorced  Caucasian Fe here for annual exam.  She has been having abdominal bloating for over 2-3 weeks.  There has been no change in diet.  After these symptoms started she did have constipation and had to take numerous laxatives to get a movement but that has resolved and abdominal bloating continues.  She also has noted a strong vaginal odor since this has happened.  She is currently off Simvastatin and is unsure why but would like to get lipids checked.  Patient's last menstrual period was 02/16/1975.          Sexually active: No.  The current method of family planning is abstinence and status post hysterectomy.    Exercising: No.  The patient does not participate in regular exercise at present. Smoker:  no  Health Maintenance: Pap: 06/10/14, Negative (TAH/BSO) MMG:  06/14/14, Bi-Rads 2: Negative, 2017 study scheduled 3 weeks from now Colonoscopy: 05/2013, polyp x 2, no repeat due to age BMD:  09/23/12, T Score, -2.6 Radius / -2.5 Right Femur Neck / -2.1 Left Femur Neck TDaP: 2013 Shingles: 04/08/08 Pneumonia: 04/2004, Prevnar 13 02/01/14 Hep C and HIV: Not indicated due to age Labs: PCP, we follow Vit D and CA 125   reports that she quit smoking about 27 years ago. Her smoking use included Cigarettes. She has a 40 pack-year smoking history. She has never used smokeless tobacco. She reports that she does not drink alcohol or use illicit drugs.  Past Medical History  Diagnosis Date  . Restless legs syndrome (RLS) 09/12/2011  . Insomnia, unspecified 09/12/2011  . Acute upper respiratory infections of unspecified site 05/23/2011  . Acute bronchitis 05/23/2011  . Chronic airway obstruction, not elsewhere classified 05/23/2011  . Other and unspecified hyperlipidemia 01/31/2011  . Major depressive disorder, single episode, unspecified ([place]) 01/31/2011  . Unspecified essential hypertension 01/31/2011  .  Mitral valve disorders 01/31/2011  . External hemorrhoids without mention of complication [id number]  . Other emphysema ([place]) 01/31/2011  . Disturbance of salivary secretion 01/31/2011  . Pain in joint, site unspecified 01/31/2011  . Stiffness of joints, not elsewhere classified, multiple sites 01/31/2011  . Lumbago 01/31/2011  . Senile osteoporosis 01/31/2011  . Other convulsions 01/31/2011  . Dizziness and giddiness 01/31/2011  . Spontaneous ecchymoses 01/31/2011  . Retinal detachment with retinal defect of right eye 2011    right eye twice  . Gait disorder 04/25/2014  . Memory disorder 04/25/2014  . Essential tremor 04/25/2014    Past Surgical History  Procedure Laterality Date  . Tonsillectomy  1941  . Cholecystectomy  2005    Dr. Marlou Starks  . Elbow surgery Right 2008    broken   Dr. Apolonio Schneiders  . Rotator cuff repair Right 2012    Dr. Theda Sers  . Squamous cell carcinoma excision Bilateral 2012, 8/14    [place] on legs   Dr. Sarajane Jews  . Retinal detachment surgery N/A     two  . Eye surgery    . Abdominal hysterectomy  06/21/2003    TAH/BSO, omenectomy PSB resect, Stg IC cystadenofibroma    Current Outpatient Prescriptions  Medication Sig Dispense Refill  . albuterol (PROVENTIL HFA;VENTOLIN HFA) 108 (90 BASE) MCG/ACT inhaler Inhale 2 puffs into the lungs every 4 (four) hours as needed for wheezing or shortness of breath. 8 g 5  . alclomethasone (ACLOVATE) 0.05 % cream Apply 1 application topically as needed (  3 x weekly).     [name] aspirin 81 MG tablet Take 81 mg by mouth daily.    . cholecalciferol (VITAMIN D) 1000 UNITS tablet One daily for Vitamin D supplementation. 100 tablet 5  . Fluticasone Furoate-Vilanterol 100-25 MCG/INH AEPB Inhale 1 puff into the lungs daily. 28 each 0  . lamoTRIgine (LAMICTAL) 150 MG tablet Take 375 mg by mouth daily. Take one tablet twice daily and half tablet at lunch    . levETIRAcetam (KEPPRA) 500 MG tablet Take 250 mg by mouth 3 (three) times daily.     .  memantine (NAMENDA) 10 MG tablet Take 1 tablet (10 mg total) by mouth 2 (two) times daily. 180 tablet 3  . pramipexole (MIRAPEX) 0.25 MG tablet Take 1 tablet (0.25 mg total) by mouth daily. 90 tablet 3  . SPIRIVA HANDIHALER 18 MCG inhalation capsule INHALE CONTENTS OF ONE CAPSULE ONCE DAILY FOR COPD. 30 capsule 0  . metroNIDAZOLE (METROGEL) 0.75 % vaginal gel Place 1 Applicatorful vaginally at bedtime. 70 g 0  . simvastatin (ZOCOR) 10 MG tablet Take 10 mg by mouth daily. Reported on 06/27/2015     No current facility-administered medications for this visit.    Family History  Problem Relation Age of Onset  . Heart disease Father     CHF  . Cancer Mother     breast  . Seizures Sister     ROS:  Pertinent items are noted in HPI.  Otherwise, a comprehensive ROS was negative.  Exam:   BP 132/84 mmHg  Pulse 76  Ht 5\' 5"  (1.651 m)  Wt 160 lb (72.576 kg)  BMI 26.63 kg/m2  LMP 02/16/1975 Height: 5\' 5"  (165.1 cm) Ht Readings from Last 3 Encounters:  06/27/15 5\' 5"  (1.651 m)  04/28/15 5\' 4"  (1.626 m)  04/20/15 5\' 5"  (1.651 m)    General appearance: alert, cooperative and appears stated age Head: Normocephalic, without obvious abnormality, atraumatic Neck: no adenopathy, supple, symmetrical, trachea midline and thyroid normal to inspection and palpation Lungs: clear to auscultation bilaterally, thoracic Kyphosis Breasts: normal appearance, no masses or tenderness Heart: regular rate and rhythm Abdomen: soft, non-tender; no masses,  no organomegaly Extremities: extremities normal, atraumatic, no cyanosis or edema Skin: Skin color, texture, turgor normal. No rashes or lesions Lymph nodes: Cervical, supraclavicular, and axillary nodes normal. No abnormal inguinal nodes palpated Neurologic: Grossly normal   Pelvic: External genitalia:  no lesions              Urethra:  normal appearing urethra with no masses, tenderness or lesions              Bartholin's and Skene's: normal                  Vagina: normal appearing vagina with normal color and discharge, no lesions              Cervix: absent              Pap taken: No. Bimanual Exam:  Uterus:  uterus absent              Adnexa: no mass, fullness, tenderness               Rectovaginal: Confirms               Anus:  normal sphincter tone, no lesions  Chaperone present: yes  A:  Well Woman with normal exam  S/P TAH/BSO w/ Omenctomy Stage IC OV cancer 06/2003 -  yearly CA 125 History of Osteoporosis, scoliosis, thoracic Kyphosis followed by PCP  Vit D deficiency History of squamous cell skin cancers  History of abdominal bloating more than 2-3 weeks  History of lung nodules and emphysema last CT scan 12/ 2016  Vaginal odor    P:   Reviewed health and wellness pertinent to exam  Pap smear as above  Mammogram is due 06/2015  Will do Affirm test but she is given Metrogel to use HS X 5 for the vaginal odor and then prn  Will follow with labs  Because of the abdominal bloating will also get PUS and follow counseled on breast self exam, mammography screening, adequate intake of calcium and vitamin D, diet and exercise, Kegel's exercises return annually or prn  An After Visit Summary was printed and given to the patient.

## 2015-06-27 NOTE — Patient Instructions (Signed)

## 2015-06-28 ENCOUNTER — Telehealth: Payer: Self-pay | Admitting: Nurse Practitioner

## 2015-06-28 DIAGNOSIS — R140 Abdominal distension (gaseous): Secondary | ICD-10-CM

## 2015-06-28 DIAGNOSIS — Z8543 Personal history of malignant neoplasm of ovary: Secondary | ICD-10-CM

## 2015-06-28 LAB — WET PREP BY MOLECULAR PROBE
CANDIDA SPECIES: NEGATIVE
Gardnerella vaginalis: NEGATIVE
Trichomonas vaginosis: NEGATIVE

## 2015-06-28 LAB — CA 125: CA 125: 44 U/mL — AB (ref <35)

## 2015-06-28 LAB — VITAMIN D 25 HYDROXY (VIT D DEFICIENCY, FRACTURES): VIT D 25 HYDROXY: 33 ng/mL (ref 30–100)

## 2015-06-28 NOTE — Telephone Encounter (Signed)
Patient was offered appointments multiple earlier appointments and declined. She states her first available Thursday is 07-20-15. Pelvic ultrasound ordered for abdominal bloating and history of ovarian cancer.  Call to patient, left message to call back.

## 2015-06-28 NOTE — Telephone Encounter (Signed)
Patient returned call. Advised of recommendation for earlier appointment. Offered Tuesday appointment with another MD.  Patient rescheduled to Tuesday 07-04-15 at 1230 with Dr Talbert Nan.  Routing to provider for final review. Patient agreeable to disposition. Will close encounter.    CC: Dr Talbert Nan.

## 2015-06-28 NOTE — Telephone Encounter (Signed)
Called patient to review benefits for a recommended procedure. Left Voicemail requesting a call back. °

## 2015-06-28 NOTE — Telephone Encounter (Signed)
Spoke with pt regarding benefit for ultrasound. Patient understood and agreeable. Patient ready to schedule. Patient scheduled 07/20/15 with Dr Quincy Simmonds. Pt aware of arrival date and time. Pt aware of 72 hours cancellation policy with [id number] fee. Forwarding to Clinical Triage to verify appointment time frame acceptable.

## 2015-07-03 NOTE — Addendum Note (Signed)
Addended by: Michele Mcalpine on: 07/03/2015 04:38 PM   Modules accepted: Orders

## 2015-07-04 ENCOUNTER — Ambulatory Visit (INDEPENDENT_AMBULATORY_CARE_PROVIDER_SITE_OTHER): Payer: Medicare Other | Admitting: Obstetrics and Gynecology

## 2015-07-04 ENCOUNTER — Ambulatory Visit (INDEPENDENT_AMBULATORY_CARE_PROVIDER_SITE_OTHER): Payer: Medicare Other

## 2015-07-04 ENCOUNTER — Encounter: Payer: Self-pay | Admitting: Obstetrics and Gynecology

## 2015-07-04 VITALS — BP 118/80 | HR 84 | Resp 14 | Wt 162.0 lb

## 2015-07-04 DIAGNOSIS — R971 Elevated cancer antigen 125 [CA 125]: Secondary | ICD-10-CM | POA: Diagnosis not present

## 2015-07-04 DIAGNOSIS — Z8543 Personal history of malignant neoplasm of ovary: Secondary | ICD-10-CM

## 2015-07-04 DIAGNOSIS — K5900 Constipation, unspecified: Secondary | ICD-10-CM | POA: Diagnosis not present

## 2015-07-04 DIAGNOSIS — R140 Abdominal distension (gaseous): Secondary | ICD-10-CM

## 2015-07-04 NOTE — Progress Notes (Signed)
GYNECOLOGY  VISIT   HPI: 82 y.o.   Divorced  Caucasian  female   [place] with Patient's last menstrual period was 02/16/1975.   here for pelvic U/S. The patient has a h/o a borderline ovarian cancer in 2005. Last year she had a CA 125 of 42, on evaluation pulmonary nodules were noted. Not felt to be cancerous. This year CA 125 is 44. U/S today is limited, no ascites. She has had a TAH/BSO.  She c/o increased abdominal bloating/distention for over 1 month. She was very constipated, she went 12 days without a BM. She tried lots of OTC medication. She finally felt like she emptied out. Now with daily BM's, but small and hard. She is eating prunes, getting fiber, drinking lots of water.  Last colonoscopy was in the last 1-2 years.   GYNECOLOGIC HISTORY: Patient's last menstrual period was 02/16/1975. Contraception:Hysterectomy  Menopausal hormone therapy: none         OB History    Gravida Para Term Preterm AB TAB SAB Ectopic Multiple Living   0 0 0 0 0 0 0 0 0 0          Patient Active Problem List   Diagnosis Date Noted  . Coronary artery calcification 12/05/2014  . Panlobular emphysema ([place]) 07/25/2014  . Dyspnea and respiratory abnormality 07/25/2014  . Multiple lung nodules on CT 07/25/2014  . Smoking history 07/25/2014  . Pain of right lower leg 05/23/2014  . Fall 05/23/2014  . Gait disorder 04/25/2014  . Memory disorder 04/25/2014  . Essential tremor 04/25/2014  . Advanced directives, counseling/discussion 02/18/2013  . Seizure disorder ([place]) 10/15/2012  . Cancer of skin, squamous cell 10/15/2012  . Unspecified vitamin D deficiency 10/15/2012  . Other and unspecified hyperlipidemia 10/15/2012  . Edema 09/10/2012  . Restless legs syndrome (RLS) 09/12/2011  . Insomnia, unspecified 09/12/2011  . COPD (chronic obstructive pulmonary disease) ([place]) 05/23/2011  . Essential hypertension 01/31/2011  . Senile osteoporosis 01/31/2011  . Major depressive disorder, single episode  01/31/2011    Past Medical History  Diagnosis Date  . Restless legs syndrome (RLS) 09/12/2011  . Insomnia, unspecified 09/12/2011  . Acute upper respiratory infections of unspecified site 05/23/2011  . Acute bronchitis 05/23/2011  . Chronic airway obstruction, not elsewhere classified 05/23/2011  . Other and unspecified hyperlipidemia 01/31/2011  . Major depressive disorder, single episode, unspecified ([place]) 01/31/2011  . Unspecified essential hypertension 01/31/2011  . Mitral valve disorders 01/31/2011  . External hemorrhoids without mention of complication [id number]  . Other emphysema ([place]) 01/31/2011  . Disturbance of salivary secretion 01/31/2011  . Pain in joint, site unspecified 01/31/2011  . Stiffness of joints, not elsewhere classified, multiple sites 01/31/2011  . Lumbago 01/31/2011  . Senile osteoporosis 01/31/2011  . Other convulsions 01/31/2011  . Dizziness and giddiness 01/31/2011  . Spontaneous ecchymoses 01/31/2011  . Retinal detachment with retinal defect of right eye 2011    right eye twice  . Gait disorder 04/25/2014  . Memory disorder 04/25/2014  . Essential tremor 04/25/2014    Past Surgical History  Procedure Laterality Date  . Tonsillectomy  1941  . Cholecystectomy  2005    Dr. Marlou Starks  . Elbow surgery Right 2008    broken   Dr. Apolonio Schneiders  . Rotator cuff repair Right 2012    Dr. Theda Sers  . Squamous cell carcinoma excision Bilateral 2012, 8/14    [place] on legs   Dr. Sarajane Jews  . Retinal detachment surgery N/A     two  .  Eye surgery    . Abdominal hysterectomy  06/21/2003    TAH/BSO, omenectomy PSB resect, Stg IC cystadenofibroma    Current Outpatient Prescriptions  Medication Sig Dispense Refill  . albuterol (PROVENTIL HFA;VENTOLIN HFA) 108 (90 BASE) MCG/ACT inhaler Inhale 2 puffs into the lungs every 4 (four) hours as needed for wheezing or shortness of breath. 8 g 5  . alclomethasone (ACLOVATE) 0.05 % cream Apply 1 application topically as needed (3 x  weekly).     [name] aspirin 81 MG tablet Take 81 mg by mouth daily.    . cholecalciferol (VITAMIN D) 1000 UNITS tablet One daily for Vitamin D supplementation. 100 tablet 5  . Fluticasone Furoate-Vilanterol 100-25 MCG/INH AEPB Inhale 1 puff into the lungs daily. 28 each 0  . lamoTRIgine (LAMICTAL) 150 MG tablet TAKE 1 TABLET TWICE DAILY AND TAKE 1/2 TABLET AT LUNCH. 225 tablet 5  . levETIRAcetam (KEPPRA) 500 MG tablet Take 250 mg by mouth 3 (three) times daily.     . memantine (NAMENDA) 10 MG tablet Take 1 tablet (10 mg total) by mouth 2 (two) times daily. 180 tablet 3  . metroNIDAZOLE (METROGEL) 0.75 % vaginal gel Place 1 Applicatorful vaginally at bedtime. 70 g 0  . pramipexole (MIRAPEX) 0.25 MG tablet Take 1 tablet (0.25 mg total) by mouth daily. 90 tablet 3  . simvastatin (ZOCOR) 10 MG tablet Take 10 mg by mouth daily. Reported on 06/27/2015    . SPIRIVA HANDIHALER 18 MCG inhalation capsule INHALE CONTENTS OF ONE CAPSULE ONCE DAILY FOR COPD. 30 capsule 0   No current facility-administered medications for this visit.     ALLERGIES: Sulfa antibiotics and Tetracyclines & related  Family History  Problem Relation Age of Onset  . Heart disease Father     CHF  . Cancer Mother     breast  . Seizures Sister     Social History   Social History  . Marital Status: Divorced    Spouse Name: N/A  . Number of Children: 0  . Years of Education: N/A   Occupational History  . retired Tourist information centre manager for [employer]    Social History Main Topics  . Smoking status: Former Smoker -- 1.00 packs/day for 40 years    Types: Cigarettes    Quit date: 04/08/1988  . Smokeless tobacco: Never Used     Comment: quit in 1990  . Alcohol Use: No  . Drug Use: No  . Sexual Activity: No   Other Topics Concern  . Not on file   Social History Narrative   L[facility] at [facility]   No children   Divorced   Exercise climbs steps 4 flights daily PT for [employer]   Alcohol none   Stopped smoking 1989    Patient drinks 3 large sodas daily.   Patient is right handed.       Review of Systems  Constitutional: Negative.   HENT: Negative.   Eyes: Negative.   Respiratory: Negative.   Cardiovascular: Negative.   Gastrointestinal:       Bloated  Genitourinary: Negative.   Musculoskeletal: Negative.   Skin: Negative.   Neurological: Negative.   Endo/Heme/Allergies: Negative.   Psychiatric/Behavioral: Negative.     PHYSICAL EXAMINATION:    BP 118/80 mmHg  Pulse 84  Resp 14  Wt 162 lb (73.483 kg)  LMP 02/16/1975    General appearance: alert, cooperative and appears stated age Abdomen: soft, non-tender;distended, no masses or organomegaly. Well healed vertical midline scar.  Pelvic: External  genitalia:  no lesions              Bimanual Exam:  Uterus and cervix are absent, no masses or tenderness              Rectovaginal: Yes.  .  Confirms.              Anus:  normal sphincter tone, no lesions  Chaperone was present for exam.  ASSESSMENT Constipation, abdominal distention H/O ovarian cancer, mildly elevated CA 125 of 44, was 42 last year.  No ascites on limited ultrasound, normal exam other than distention    PLAN Continue prunes, water and fiber Add daily miralax F/U with her primary   An After Visit Summary was printed and given to the patient.  15+ minutes face to face time of which over 50% was spent in counseling.

## 2015-07-10 ENCOUNTER — Telehealth: Payer: Self-pay | Admitting: Nurse Practitioner

## 2015-07-10 NOTE — Telephone Encounter (Signed)
Call from [place] with [place] at [facility], office of Dr Drema Dallas. Requested most recent labs faxed to them. State patient was there this morning to see pcp. Call placed to patient to verify release. Left voicemail to return call.

## 2015-07-12 NOTE — Telephone Encounter (Signed)
2nd attempt.  Left voicemail.

## 2015-07-18 ENCOUNTER — Other Ambulatory Visit: Payer: Self-pay | Admitting: Neurology

## 2015-07-20 ENCOUNTER — Other Ambulatory Visit: Payer: Medicare Other

## 2015-07-20 ENCOUNTER — Other Ambulatory Visit: Payer: Medicare Other | Admitting: Obstetrics and Gynecology

## 2015-08-04 ENCOUNTER — Other Ambulatory Visit: Payer: Self-pay | Admitting: Nurse Practitioner

## 2015-08-10 ENCOUNTER — Encounter: Payer: Self-pay | Admitting: Internal Medicine

## 2015-10-09 ENCOUNTER — Other Ambulatory Visit: Payer: Self-pay | Admitting: Neurology

## 2015-10-16 ENCOUNTER — Other Ambulatory Visit: Payer: Self-pay | Admitting: Gastroenterology

## 2015-10-16 DIAGNOSIS — R109 Unspecified abdominal pain: Secondary | ICD-10-CM

## 2015-10-16 DIAGNOSIS — R194 Change in bowel habit: Secondary | ICD-10-CM

## 2015-10-20 ENCOUNTER — Ambulatory Visit
Admission: RE | Admit: 2015-10-20 | Discharge: 2015-10-20 | Disposition: A | Discharge status: Home / Self Care | Payer: Medicare Other | Source: Ambulatory Visit | Attending: Gastroenterology | Admitting: Gastroenterology

## 2015-10-20 DIAGNOSIS — R109 Unspecified abdominal pain: Secondary | ICD-10-CM

## 2015-10-20 DIAGNOSIS — R194 Change in bowel habit: Secondary | ICD-10-CM

## 2015-10-20 MED ORDER — IOPAMIDOL (ISOVUE-300) INJECTION 61%
100.0000 mL | Freq: Once | INTRAVENOUS | Status: AC | PRN
  Administered 2015-10-20: 100 mL via INTRAVENOUS

## 2015-10-23 ENCOUNTER — Ambulatory Visit: Payer: Medicare Other | Admitting: Internal Medicine

## 2015-10-30 ENCOUNTER — Ambulatory Visit (INDEPENDENT_AMBULATORY_CARE_PROVIDER_SITE_OTHER): Payer: Medicare Other | Admitting: Internal Medicine

## 2015-10-30 ENCOUNTER — Encounter: Payer: Self-pay | Admitting: Internal Medicine

## 2015-10-30 VITALS — BP 138/82 | HR 85 | Ht 65.0 in | Wt 164.0 lb

## 2015-10-30 DIAGNOSIS — J431 Panlobular emphysema: Secondary | ICD-10-CM

## 2015-10-30 DIAGNOSIS — R918 Other nonspecific abnormal finding of lung field: Secondary | ICD-10-CM

## 2015-10-30 DIAGNOSIS — R0600 Dyspnea, unspecified: Secondary | ICD-10-CM

## 2015-10-30 DIAGNOSIS — R0689 Other abnormalities of breathing: Secondary | ICD-10-CM | POA: Diagnosis not present

## 2015-10-30 NOTE — Progress Notes (Signed)
Subjective:     Patient ID: [name], female   DOB: 19-May-1932, 82 y.o.   MRN: [id number]  HPI   OV 10/30/2015  Chief Complaint  Patient presents with  . Follow-up    Pt c/o occasional dry cough. Pt denies SOB/wheeze/CP/tightness. Pt states that as long as she uses her inhalers she does very well.    82 year old female follow-up for multiple issues  #Emphysema isolated low DLCO and CT evidence without out of proportion shortness of breath in setting of normal cardiac stress test and echocardiogram: At last visit 6 months ago she felt that pulmonary rehabilitation would not be that useful and would prefer to do it as a physical therapy in her friend's home. She did not do the physical therapy up until 3 weeks ago when she is doing it more for balance issues. Nevertheless she states dyspnea stable. I discovered today that she is only taking Spiriva scheduled. She is only taking the Brio as needed which she is taking 3 times a month. She does not want to do albuterol for rescue but instead prefers the [facility] for needing to "feel safe"  #Multiple lung nodules: Last CT chest without contrast was December 2016. Nodules were stable one of them had resolved and one of them had diminished. Recommendation was to do CT chest December 2017. But in July 2017. Abdominal CT and this shows an enlarging left lower lobe pulmonary nodule subpleural groundglass opacity. This was compared to March 2016. She is concerned about this. She does not have hemoptysis or weight loss. I personally visualized the CT chest and agree     has a past medical history of Acute bronchitis (05/23/2011); Acute upper respiratory infections of unspecified site (05/23/2011); Chronic airway obstruction, not elsewhere classified (05/23/2011); Disturbance of salivary secretion (01/31/2011); Dizziness and giddiness (01/31/2011); Essential tremor (04/25/2014); External hemorrhoids without mention of complication ([id number]); Gait disorder  (04/25/2014); Insomnia, unspecified (09/12/2011); Lumbago (01/31/2011); Major depressive disorder, single episode, unspecified ([place]) (01/31/2011); Memory disorder (04/25/2014); Mitral valve disorders (01/31/2011); Other and unspecified hyperlipidemia (01/31/2011); Other convulsions (01/31/2011); Other emphysema ([place]) (01/31/2011); Pain in joint, site unspecified (01/31/2011); Restless legs syndrome (RLS) (09/12/2011); Retinal detachment with retinal defect of right eye (2011); Senile osteoporosis (01/31/2011); Spontaneous ecchymoses (01/31/2011); Stiffness of joints, not elsewhere classified, multiple sites (01/31/2011); and Unspecified essential hypertension (01/31/2011).   reports that she quit smoking about 27 years ago. Her smoking use included Cigarettes. She has a 40.00 pack-year smoking history. She has never used smokeless tobacco.  Past Surgical History:  Procedure Laterality Date  . ABDOMINAL HYSTERECTOMY  06/21/2003   TAH/BSO, omenectomy PSB resect, Stg IC cystadenofibroma  . CHOLECYSTECTOMY  2005   Dr. Marlou Starks  . ELBOW SURGERY Right 2008   broken   Dr. Apolonio Schneiders  . EYE SURGERY    . RETINAL DETACHMENT SURGERY N/A    two  . ROTATOR CUFF REPAIR Right 2012   Dr. Theda Sers  . SQUAMOUS CELL CARCINOMA EXCISION Bilateral 2012, 8/14   [place] on legs   Dr. Sarajane Jews  . TONSILLECTOMY  1941    Allergies  Allergen Reactions  . Sulfa Antibiotics Nausea And Vomiting  . Tetracyclines & Related     Immunization History  Administered Date(s) Administered  . Influenza Split 01/06/2014  . Influenza Whole 01/07/2012, 01/06/2013  . Influenza,inj,Quad PF,36+ Mos 12/21/2014  . Pneumococcal Conjugate-13 02/01/2014  . Pneumococcal Polysaccharide-23 04/08/2004  . Td 04/08/2002  . Tdap 04/09/2011  . Zoster 04/08/2008    Family History  Problem Relation Age  of Onset  . Heart disease Father     CHF  . Cancer Mother     breast  . Seizures Sister      Current Outpatient Prescriptions:  .  albuterol  (PROVENTIL HFA;VENTOLIN HFA) 108 (90 BASE) MCG/ACT inhaler, Inhale 2 puffs into the lungs every 4 (four) hours as needed for wheezing or shortness of breath., Disp: 8 g, Rfl: 5 .  alclomethasone (ACLOVATE) 0.05 % cream, Apply 1 application topically as needed (3 x weekly). , Disp: , Rfl:  .  alendronate (FOSAMAX) 70 MG tablet, Take 1 tablet by mouth once a week., Disp: , Rfl:  .  aspirin 81 MG tablet, Take 81 mg by mouth daily., Disp: , Rfl:  .  cholecalciferol (VITAMIN D) 1000 UNITS tablet, One daily for Vitamin D supplementation., Disp: 100 tablet, Rfl: 5 .  ciprofloxacin (CIPRO) 500 MG tablet, Take 1 tablet by mouth 2 (two) times daily., Disp: , Rfl:  .  Fluticasone Furoate-Vilanterol 100-25 MCG/INH AEPB, Inhale 1 puff into the lungs daily., Disp: 28 each, Rfl: 0 .  hydrocortisone 2.5 % cream, Apply 1 application topically as needed., Disp: , Rfl:  .  ketoconazole (NIZORAL) 2 % cream, Apply 1 application topically as needed., Disp: , Rfl:  .  lamoTRIgine (LAMICTAL) 150 MG tablet, TAKE 1 TABLET TWICE DAILY AND TAKE 1/2 TABLET AT LUNCH., Disp: 225 tablet, Rfl: 5 .  levETIRAcetam (KEPPRA) 500 MG tablet, TAKE (1/2) TABLET THREE TIMES DAILY., Disp: 135 tablet, Rfl: 0 .  memantine (NAMENDA) 10 MG tablet, Take 1 tablet (10 mg total) by mouth 2 (two) times daily., Disp: 180 tablet, Rfl: 3 .  metroNIDAZOLE (METROGEL) 0.75 % vaginal gel, Place 1 Applicatorful vaginally at bedtime., Disp: 70 g, Rfl: 0 .  pramipexole (MIRAPEX) 0.25 MG tablet, Take 1 tablet (0.25 mg total) by mouth daily., Disp: 90 tablet, Rfl: 3 .  simvastatin (ZOCOR) 10 MG tablet, Take 10 mg by mouth daily. Reported on 06/27/2015, Disp: , Rfl:  .  SPIRIVA HANDIHALER 18 MCG inhalation capsule, INHALE CONTENTS OF ONE CAPSULE ONCE DAILY FOR COPD., Disp: 30 capsule, Rfl: 0   Review of Systems     Objective:   Physical Exam  Constitutional: She is oriented to person, place, and time. She appears well-developed and well-nourished. No  distress.  HENT:  Head: Normocephalic and atraumatic.  Right Ear: External ear normal.  Left Ear: External ear normal.  Mouth/Throat: Oropharynx is clear and moist. No oropharyngeal exudate.  Eyes: Conjunctivae and EOM are normal. Pupils are equal, round, and reactive to light. Right eye exhibits no discharge. Left eye exhibits no discharge. No scleral icterus.  Neck: Normal range of motion. Neck supple. No JVD present. No tracheal deviation present. No thyromegaly present.  Cardiovascular: Normal rate, regular rhythm, normal heart sounds and intact distal pulses.  Exam reveals no gallop and no friction rub.   No murmur heard. Pulmonary/Chest: Effort normal and breath sounds normal. No respiratory distress. She has no wheezes. She has no rales. She exhibits no tenderness.  Kyphotic  Abdominal: Soft. Bowel sounds are normal. She exhibits no distension and no mass. There is no tenderness. There is no rebound and no guarding.  Musculoskeletal: Normal range of motion. She exhibits no edema or tenderness.  Walks with a cane  Lymphadenopathy:    She has no cervical adenopathy.  Neurological: She is alert and oriented to person, place, and time. She has normal reflexes. No cranial nerve deficit. She exhibits normal muscle tone. Coordination normal.  Skin: Skin is warm and dry. No rash noted. She is not diaphoretic. No erythema. No pallor.  Psychiatric: She has a normal mood and affect. Her behavior is normal. Judgment and thought content normal.  Vitals reviewed.   Vitals:   10/30/15 0931  BP: 138/82  Pulse: 85  SpO2: 94%  Weight: 164 lb (74.4 kg)  Height: 5\' 5"  (1.651 m)        Assessment:       ICD-9-CM ICD-10-CM   1. Multiple lung nodules on CT 793.19 R91.8   2. Panlobular emphysema (HCC) 492.8 J43.1   3. Dyspnea and respiratory abnormality 786.09 R06.00     R06.89        Plan:      #Multiple lung nodules on CT - This is significantly improved as of December 2016 but There  is concern that 10/20/2015 CT abdomen that the left lower lobe pulmonary nodule is enlarging and worsened now in July 2014 - Do super dimension CT chest without contrast and we will call you with the results  #Emphysema with out of proportion shortness of breath - Glad this is stable and inhalers are helping -Continue Spiriva scheduled as before - As discussed it is okay for now per take Brio as needed - Flu shot in the fall  #Follow-up - 6 months routine follow-up but we could call you sooner depending on the findings on CT chest   Dr. Brand Males, M.D., [facility].C.P Pulmonary and Critical Care Medicine Staff Physician [place] [facility] Pager: [phone], If no answer or between  15:00h - 7:00h: call [phone]  10/30/2015 9:51 AM

## 2015-10-30 NOTE — Patient Instructions (Addendum)
ICD-9-CM ICD-10-CM   1. Multiple lung nodules on CT 793.19 R91.8   2. Panlobular emphysema (HCC) 492.8 J43.1    #Multiple lung nodules on CT - This is significantly improved as of December 2016 but There is concern that 10/20/2015 CT abdomen that the left lower lobe pulmonary nodule is enlarging - Do super dimension CT chest without contrast and we will call you with the results  #Emphysema with out of proportion shortness of breath - Glad this is stable and inhalers are helping -Continue Spiriva scheduled as before - As discussed it is okay for now per take Brio as needed - Flu shot in the fall  #Follow-up - 6 months routine follow-up but we could call you sooner depending on the findings on CT chest

## 2015-10-30 NOTE — Addendum Note (Signed)
Addended by: Beckie Busing on: 10/30/2015 09:57 AM   Modules accepted: Orders

## 2015-11-01 ENCOUNTER — Telehealth: Payer: Self-pay | Admitting: Internal Medicine

## 2015-11-01 NOTE — Telephone Encounter (Signed)
Spoke with Wells Guiles at [facility]. Advised her that MR wants pt to have HRCT. They will use the HRCT order that was placed on 10/30/15. Nothing further was needed.

## 2015-11-03 ENCOUNTER — Ambulatory Visit
Admission: RE | Admit: 2015-11-03 | Discharge: 2015-11-03 | Disposition: A | Discharge status: Home / Self Care | Payer: Medicare Other | Source: Ambulatory Visit | Attending: Internal Medicine | Admitting: Internal Medicine

## 2015-11-03 ENCOUNTER — Telehealth: Payer: Self-pay | Admitting: Internal Medicine

## 2015-11-03 DIAGNOSIS — R911 Solitary pulmonary nodule: Secondary | ICD-10-CM

## 2015-11-03 DIAGNOSIS — R918 Other nonspecific abnormal finding of lung field: Secondary | ICD-10-CM

## 2015-11-03 NOTE — Telephone Encounter (Signed)
lmtcb

## 2015-11-03 NOTE — Telephone Encounter (Signed)
1. LUL nodule is 1.3cm  - very slowly enlarging over time 2. LLL nodule - 1.8cm  Given age, smoking hx and size and slow grwoth - please have her do PET scan and return to see me 11/10/15   Dr. Brand Males, M.D., [facility].C.P Pulmonary and Critical Care Medicine Staff Physician [place] [facility] Pager: [phone], If no answer or between  15:00h - 7:00h: call [phone]  11/03/2015 5:15 PM       Ct Chest High Resolution  Result Date: 11/03/2015 CLINICAL DATA:  82 year old female with history of pulmonary nodule. Followup study. Former smoker (20 years). Shortness breath for 1 week. EXAM: CT CHEST WITHOUT CONTRAST TECHNIQUE: Multidetector CT imaging of the chest was performed following the standard protocol without intravenous contrast. High resolution imaging of the lungs, as well as inspiratory and expiratory imaging, was performed. COMPARISON:  Multiple priors, most recently chest CT 03/30/2015. FINDINGS: Cardiovascular: Heart size is normal. There is no significant pericardial fluid, thickening or pericardial calcification. There is aortic atherosclerosis, as well as atherosclerosis of the great vessels of the mediastinum and the coronary arteries, including calcified atherosclerotic plaque in the left main, left anterior descending and right coronary arteries. Mediastinum/Nodes: No pathologically enlarged mediastinal or hilar lymph nodes. Please note that accurate exclusion of hilar adenopathy is limited on noncontrast CT scans. Esophagus is unremarkable in appearance. No axillary lymphadenopathy. Lungs/Pleura: In the central aspect of the left upper lobe there is a mixed ground-glass attenuation and solid nodule, which currently measures 13 x 11 x 11 mm (image 51 of series 4 and coronal image 63 of series 601), which has a central solid component measuring up to 1 cm (axial image 52 of series 3). Previously noted pleural-based nodule in the inferior  aspect of the left lower lobe is similar to the recent prior examination of the abdomen and pelvis dated 10/20/2015 measuring 18 x 12 x 6 mm (axial image 82 of series 4 and coronal image 100 of series 601). This lesion has slowly progressively enlarged over serial examinations, and is concerning for slow-growing neoplasm such as an adenocarcinoma. A few other scattered tiny 2-3 mm pulmonary nodules are noted throughout the lungs bilaterally, predominantly in the periphery, similar prior examinations, most compatible with areas of chronic mucoid impaction within terminal bronchioles. No acute consolidative airspace disease. No pleural effusions. Diffuse bronchial wall thickening with mild to moderate centrilobular emphysema. Stable 9 x 8 mm pulmonary nodule in the posterior aspect of the lateral segment of the right middle lobe abutting the major fissure (image 68 of series 4). Mild eventration of the posterolateral aspect of the left hemidiaphragm. Upper Abdomen: 4.2 cm intermediate attenuation lesion in the central aspect of the liver and 1.3 cm intermediate attenuation lesion in segment 8 of the liver corresponds to known at cavernous hemangiomas. Aortic atherosclerosis. Musculoskeletal: Old T12 compression fracture with approximately 50% loss of anterior vertebral body height, unchanged. There are no aggressive appearing lytic or blastic lesions noted in the visualized portions of the skeleton. IMPRESSION: 1. There are 2 concerning lesions in the left lung, including a slowly progressively enlarging mixed solid and subsolid lesion in the left upper lobe (image 52 of series 4) which is concerning for potential slow-growing neoplasm such as adenocarcinoma, as well as a pleural-based lesion in the left lower lobe. Further evaluation of these lesions with PET-CT is recommended in the near future for further diagnostic purposes. 2. Diffuse bronchial wall thickening  with mild to moderate centrilobular emphysema;  imaging findings concerning for potential COPD. 3. Aortic atherosclerosis, in addition to left main and 2 vessel coronary artery disease. 4. There are calcifications of the aortic valve. Echocardiographic correlation for evaluation of potential valvular dysfunction may be warranted if clinically indicated. 5. Additional incidental findings, as above. Electronically Signed   By: Vinnie Langton M.D.   On: 11/03/2015 16:38

## 2015-11-04 NOTE — Telephone Encounter (Signed)
Also pleae have super D of the CT fro 11/03/15 and left on top of my [place] when I return 11/10/15

## 2015-11-06 ENCOUNTER — Telehealth: Payer: Self-pay | Admitting: Internal Medicine

## 2015-11-06 NOTE — Telephone Encounter (Signed)
Spoke with pt. She is aware of results. ROV has been scheduled for 11/10/15 at 10:30am. I left a message with [facility] to have Super D disc made. Nothing further was needed.

## 2015-11-06 NOTE — Telephone Encounter (Signed)
lmtcb

## 2015-11-06 NOTE — Addendum Note (Signed)
Addended by: Desmond Dike C on: 11/06/2015 10:03 AM   Modules accepted: Orders

## 2015-11-06 NOTE — Telephone Encounter (Signed)
Pt returning call.[name] ° °

## 2015-11-06 NOTE — Telephone Encounter (Signed)
RA- please advise what we can call in for this pt to help with anxiety from pet scan  MR not available, thanks

## 2015-11-06 NOTE — Telephone Encounter (Signed)
XAnax 0.5 mg 1-2 tabs prn

## 2015-11-07 ENCOUNTER — Telehealth: Payer: Self-pay | Admitting: Internal Medicine

## 2015-11-07 MED ORDER — ALPRAZOLAM 0.5 MG PO TABS: ORAL_TABLET | ORAL | 0 refills | Status: DC

## 2015-11-07 NOTE — Telephone Encounter (Signed)
Called spoke with patient and advised of RA's recommendations.  Pt okay with this and voiced her understanding.  She requested that we have [facility] deliver the meds for her - advised will ask tem to do so.  [place] and spoke with pharmacist Tye Maryland.  Verbal order for Alprazolam 0.5mg  #2 tablets, 1-2 tabs prn prior to scan, no refills given.  Also informed Tye Maryland that pt requests this to be delivered.  Tye Maryland voiced her understanding.  Med list updated.  Nothing further needed; will sign off.

## 2015-11-07 NOTE — Telephone Encounter (Signed)
MR responded that he does not have computer access to sign order Spoke with TP, okay to switch order to her since MR is not available This has been done Order has been signed Nothing further needed; will sign off

## 2015-11-07 NOTE — Telephone Encounter (Signed)
lmtcb

## 2015-11-07 NOTE — Telephone Encounter (Signed)
Texted MR and waiting for response

## 2015-11-07 NOTE — Telephone Encounter (Signed)
MR- please sign order in your basket for PET on this pt  She is scheduled for PET 11/08/15 am and this will have to be rescheduled if you do not sign order by 2 pm today  Thanks

## 2015-11-08 ENCOUNTER — Ambulatory Visit
Admission: RE | Admit: 2015-11-08 | Discharge: 2015-11-08 | Disposition: A | Discharge status: Home / Self Care | Payer: Medicare Other | Source: Ambulatory Visit | Attending: Internal Medicine | Admitting: Internal Medicine

## 2015-11-08 DIAGNOSIS — R938 Abnormal findings on diagnostic imaging of other specified body structures: Secondary | ICD-10-CM | POA: Insufficient documentation

## 2015-11-08 DIAGNOSIS — R918 Other nonspecific abnormal finding of lung field: Secondary | ICD-10-CM | POA: Diagnosis not present

## 2015-11-08 DIAGNOSIS — R911 Solitary pulmonary nodule: Secondary | ICD-10-CM | POA: Insufficient documentation

## 2015-11-08 LAB — GLUCOSE, CAPILLARY: GLUCOSE-CAPILLARY: 84 mg/dL (ref 65–99)

## 2015-11-08 MED ORDER — FLUDEOXYGLUCOSE F - 18 (FDG) INJECTION
11.9100 | Freq: Once | INTRAVENOUS | Status: AC | PRN
  Administered 2015-11-08: 11.91 via INTRAVENOUS

## 2015-11-09 ENCOUNTER — Other Ambulatory Visit: Payer: Self-pay | Admitting: Adult Health

## 2015-11-10 ENCOUNTER — Telehealth: Payer: Self-pay | Admitting: Internal Medicine

## 2015-11-10 ENCOUNTER — Encounter: Payer: Self-pay | Admitting: Internal Medicine

## 2015-11-10 ENCOUNTER — Ambulatory Visit (INDEPENDENT_AMBULATORY_CARE_PROVIDER_SITE_OTHER): Payer: Medicare Other | Admitting: Internal Medicine

## 2015-11-10 VITALS — BP 148/88 | HR 86 | Ht 65.0 in | Wt 161.2 lb

## 2015-11-10 DIAGNOSIS — R918 Other nonspecific abnormal finding of lung field: Secondary | ICD-10-CM

## 2015-11-10 DIAGNOSIS — R946 Abnormal results of thyroid function studies: Secondary | ICD-10-CM

## 2015-11-10 NOTE — Patient Instructions (Signed)
Multiple lung nodules on CT  - will discuss with Dr Lamonte Sakai about getting navigational bronchoscopy of nodules with biopsy  Abnormal thyroid uptake - rfer Interventional radiology for US guided fine needle biopsy of right thyroid gland  - to be done next few to several days and before bronchscopy  FU  -= based on bronch date

## 2015-11-10 NOTE — Progress Notes (Signed)
Subjective:     Patient ID: [name], female   DOB: 1933-03-31, 82 y.o.   MRN: [id number]  HPI  OV 10/30/2015  Chief Complaint  Patient presents with  . Follow-up    Pt c/o occasional dry cough. Pt denies SOB/wheeze/CP/tightness. Pt states that as long as she uses her inhalers she does very well.    82 year old female follow-up for multiple issues  #Emphysema isolated low DLCO and CT evidence without out of proportion shortness of breath in setting of normal cardiac stress test and echocardiogram: At last visit 6 months ago she felt that pulmonary rehabilitation would not be that useful and would prefer to do it as a physical therapy in her friend's home. She did not do the physical therapy up until 3 weeks ago when she is doing it more for balance issues. Nevertheless she states dyspnea stable. I discovered today that she is only taking Spiriva scheduled. She is only taking the Brio as needed which she is taking 3 times a month. She does not want to do albuterol for rescue but instead prefers the [facility] for needing to "feel safe"  #Multiple lung nodules: Last CT chest without contrast was December 2016. Nodules were stable one of them had resolved and one of them had diminished. Recommendation was to do CT chest December 2017. But in July 2017. Abdominal CT and this shows an enlarging left lower lobe pulmonary nodule subpleural groundglass opacity. This was compared to March 2016. She is concerned about this. She does not have hemoptysis or weight loss. I personally visualized the CT chest and agree    OV 11/10/2015  Chief Complaint  Patient presents with  . Follow-up    Pt here after PET scan. Pt denies breathing changes since last OV. Pt denies any new complaints at this time.    Presents with all her sisters to discuss results of Ct chest abnd PET scan  # LUNG NODULE CT chest 11/03/15: personally visualised and PET scan 11/08/15 1. LUL nodule is 1.3cm  - very slowly enlarging over  time - LOW uptake 2. LLL nodule - 1.8cm - INTERMEDIATE UPDTAKE  Given age, smoking hx and size and slow grwoth - likely we are dealing with BAC  #New issue  - PET shows high uptake in right side of thyroid   Ct Chest High Resolution  Result Date: 11/03/2015 CLINICAL DATA:  82 year old female with history of pulmonary nodule. Followup study. Former smoker (20 years). Shortness breath for 1 week. EXAM: CT CHEST WITHOUT CONTRAST TECHNIQUE: Multidetector CT imaging of the chest was performed following the standard protocol without intravenous contrast. High resolution imaging of the lungs, as well as inspiratory and expiratory imaging, was performed. COMPARISON:  Multiple priors, most recently chest CT 03/30/2015. FINDINGS: Cardiovascular: Heart size is normal. There is no significant pericardial fluid, thickening or pericardial calcification. There is aortic atherosclerosis, as well as atherosclerosis of the great vessels of the mediastinum and the coronary arteries, including calcified atherosclerotic plaque in the left main, left anterior descending and right coronary arteries. Mediastinum/Nodes: No pathologically enlarged mediastinal or hilar lymph nodes. Please note that accurate exclusion of hilar adenopathy is limited on noncontrast CT scans. Esophagus is unremarkable in appearance. No axillary lymphadenopathy. Lungs/Pleura: In the central aspect of the left upper lobe there is a mixed ground-glass attenuation and solid nodule, which currently measures 13 x 11 x 11 mm (image 51 of series 4 and coronal image 63 of series 601), which has a central solid  component measuring up to 1 cm (axial image 52 of series 3). Previously noted pleural-based nodule in the inferior aspect of the left lower lobe is similar to the recent prior examination of the abdomen and pelvis dated 10/20/2015 measuring 18 x 12 x 6 mm (axial image 82 of series 4 and coronal image 100 of series 601). This lesion has slowly  progressively enlarged over serial examinations, and is concerning for slow-growing neoplasm such as an adenocarcinoma. A few other scattered tiny 2-3 mm pulmonary nodules are noted throughout the lungs bilaterally, predominantly in the periphery, similar prior examinations, most compatible with areas of chronic mucoid impaction within terminal bronchioles. No acute consolidative airspace disease. No pleural effusions. Diffuse bronchial wall thickening with mild to moderate centrilobular emphysema. Stable 9 x 8 mm pulmonary nodule in the posterior aspect of the lateral segment of the right middle lobe abutting the major fissure (image 68 of series 4). Mild eventration of the posterolateral aspect of the left hemidiaphragm. Upper Abdomen: 4.2 cm intermediate attenuation lesion in the central aspect of the liver and 1.3 cm intermediate attenuation lesion in segment 8 of the liver corresponds to known at cavernous hemangiomas. Aortic atherosclerosis. Musculoskeletal: Old T12 compression fracture with approximately 50% loss of anterior vertebral body height, unchanged. There are no aggressive appearing lytic or blastic lesions noted in the visualized portions of the skeleton. IMPRESSION: 1. There are 2 concerning lesions in the left lung, including a slowly progressively enlarging mixed solid and subsolid lesion in the left upper lobe (image 52 of series 4) which is concerning for potential slow-growing neoplasm such as adenocarcinoma, as well as a pleural-based lesion in the left lower lobe. Further evaluation of these lesions with PET-CT is recommended in the near future for further diagnostic purposes. 2. Diffuse bronchial wall thickening with mild to moderate centrilobular emphysema; imaging findings concerning for potential COPD. 3. Aortic atherosclerosis, in addition to left main and 2 vessel coronary artery disease. 4. There are calcifications of the aortic valve. Echocardiographic correlation for evaluation of  potential valvular dysfunction may be warranted if clinically indicated. 5. Additional incidental findings, as above. Electronically Signed   By: Vinnie Langton M.D.   On: 11/03/2015 16:38    Nm Pet Image Initial (pi) Skull Base To Thigh  Result Date: 11/08/2015 CLINICAL DATA:  Initial treatment strategy for lung nodule. EXAM: NUCLEAR MEDICINE PET SKULL BASE TO THIGH TECHNIQUE: 11.9 mCi F-18 FDG was injected intravenously. Full-ring PET imaging was performed from the skull base to thigh after the radiotracer. CT data was obtained and used for attenuation correction and anatomic localization. FASTING BLOOD GLUCOSE:  Value: 84 mg/dl COMPARISON:  CT chest 11/03/2015. FINDINGS: NECK No hypermetabolic lymph nodes in the neck. Asymmetric FDG uptake is identified in the right thyroid nodule which was seen on previous imaging study to be multi nodular. FDG uptake can be increase in the setting of benign thyroid disease (adenoma) but considered thyroid ultrasound to further evaluate. CHEST No hypermetabolic mediastinal or hilar nodes. The 13 x 11 x 11 mm central left upper lobe makes solid and sub solid nodule seen on recent CT scan shows low level FDG uptake with SUV max = 1.5. Previously described pleural-based nodule in the inferior left lower lobe also shows low level hypermetabolism with SUV max = 2.3. Changes of emphysema again noted. The 9 mm nodule along the right major fissure noted on the previous study shows no evidence for FDG uptake above background levels on today's study. ABDOMEN/PELVIS No abnormal  hypermetabolic activity within the liver, pancreas, adrenal glands, or spleen. No hypermetabolic lymph nodes in the abdomen or pelvis. Gallbladder surgically absent. No evidence for hypermetabolism in the patient's known hepatic cavernous hemangioma. Nonobstructing right renal stone again noted. There is abdominal aortic atherosclerosis without aneurysm. Small ventral hernia lower midline abdominal wall  contains small bowel loops without complicating features. SKELETON No focal hypermetabolic activity to suggest skeletal metastasis. IMPRESSION: 1. The 13 mm central left upper lobe lesion shows low level FDG accumulation suggestive of an infectious/inflammatory etiology. Low-grade or well differentiated neoplasm can be poorly FDG avid and continued surveillance recommended. 2. 18 mm left lower lobe pleural-based nodule shows uptake levels borderline between neoplastic and infectious/inflammatory causes. This could certainly represent low-grade or well differentiated neoplasm especially in the setting of slow progression over cereal exams as described in the recent diagnostic CT chest report. 3. Hypermetabolism right thyroid gland with diffuse thyroid nodularity seen on CT images. FDG uptake can be seen in thyroid parenchyma in the setting of benign disease and thyroid ultrasound may prove helpful to further evaluate. Electronically Signed   By: Misty Stanley M.D.   On: 11/08/2015 13:13       has a past medical history of Acute bronchitis (05/23/2011); Acute upper respiratory infections of unspecified site (05/23/2011); Chronic airway obstruction, not elsewhere classified (05/23/2011); Disturbance of salivary secretion (01/31/2011); Dizziness and giddiness (01/31/2011); Essential tremor (04/25/2014); External hemorrhoids without mention of complication ([id number]); Gait disorder (04/25/2014); Insomnia, unspecified (09/12/2011); Lumbago (01/31/2011); Major depressive disorder, single episode, unspecified ([place]) (01/31/2011); Memory disorder (04/25/2014); Mitral valve disorders (01/31/2011); Other and unspecified hyperlipidemia (01/31/2011); Other convulsions (01/31/2011); Other emphysema ([place]) (01/31/2011); Pain in joint, site unspecified (01/31/2011); Restless legs syndrome (RLS) (09/12/2011); Retinal detachment with retinal defect of right eye (2011); Senile osteoporosis (01/31/2011); Spontaneous ecchymoses (01/31/2011);  Stiffness of joints, not elsewhere classified, multiple sites (01/31/2011); and Unspecified essential hypertension (01/31/2011).   reports that she quit smoking about 27 years ago. Her smoking use included Cigarettes. She has a 40.00 pack-year smoking history. She has never used smokeless tobacco.  Past Surgical History:  Procedure Laterality Date  . ABDOMINAL HYSTERECTOMY  06/21/2003   TAH/BSO, omenectomy PSB resect, Stg IC cystadenofibroma  . CHOLECYSTECTOMY  2005   Dr. Marlou Starks  . ELBOW SURGERY Right 2008   broken   Dr. Apolonio Schneiders  . EYE SURGERY    . RETINAL DETACHMENT SURGERY N/A    two  . ROTATOR CUFF REPAIR Right 2012   Dr. Theda Sers  . SQUAMOUS CELL CARCINOMA EXCISION Bilateral 2012, 8/14   [place] on legs   Dr. Sarajane Jews  . TONSILLECTOMY  1941    Allergies  Allergen Reactions  . Sulfa Antibiotics Nausea And Vomiting  . Tetracyclines & Related     Immunization History  Administered Date(s) Administered  . Influenza Split 01/06/2014  . Influenza Whole 01/07/2012, 01/06/2013  . Influenza,inj,Quad PF,36+ Mos 12/21/2014  . Pneumococcal Conjugate-13 02/01/2014  . Pneumococcal Polysaccharide-23 04/08/2004  . Td 04/08/2002  . Tdap 04/09/2011  . Zoster 04/08/2008    Family History  Problem Relation Age of Onset  . Heart disease Father     CHF  . Cancer Mother     breast  . Seizures Sister      Current Outpatient Prescriptions:  .  albuterol (PROVENTIL HFA;VENTOLIN HFA) 108 (90 BASE) MCG/ACT inhaler, Inhale 2 puffs into the lungs every 4 (four) hours as needed for wheezing or shortness of breath., Disp: 8 g, Rfl: 5 .  alclomethasone (ACLOVATE) 0.05 % cream,  Apply 1 application topically as needed (3 x weekly). , Disp: , Rfl:  .  alendronate (FOSAMAX) 70 MG tablet, Take 1 tablet by mouth once a week., Disp: , Rfl:  .  aspirin 81 MG tablet, Take 81 mg by mouth daily., Disp: , Rfl:  .  cholecalciferol (VITAMIN D) 1000 UNITS tablet, One daily for Vitamin D supplementation., Disp:  100 tablet, Rfl: 5 .  hydrocortisone 2.5 % cream, Apply 1 application topically as needed., Disp: , Rfl:  .  ketoconazole (NIZORAL) 2 % cream, Apply 1 application topically as needed., Disp: , Rfl:  .  lamoTRIgine (LAMICTAL) 150 MG tablet, TAKE 1 TABLET TWICE DAILY AND TAKE 1/2 TABLET AT LUNCH., Disp: 225 tablet, Rfl: 5 .  levETIRAcetam (KEPPRA) 500 MG tablet, TAKE (1/2) TABLET THREE TIMES DAILY., Disp: 135 tablet, Rfl: 0 .  memantine (NAMENDA) 10 MG tablet, Take 1 tablet (10 mg total) by mouth 2 (two) times daily., Disp: 180 tablet, Rfl: 3 .  pramipexole (MIRAPEX) 0.25 MG tablet, Take 1 tablet (0.25 mg total) by mouth daily., Disp: 90 tablet, Rfl: 3 .  simvastatin (ZOCOR) 10 MG tablet, Take 10 mg by mouth daily. Reported on 06/27/2015, Disp: , Rfl:  .  SPIRIVA HANDIHALER 18 MCG inhalation capsule, INHALE CONTENTS OF ONE CAPSULE ONCE DAILY FOR COPD., Disp: 30 capsule, Rfl: 5   Review of Systems     Objective:   Physical Exam Vitals:   11/10/15 1037  BP: (!) 148/88  Pulse: 86  SpO2: 95%  Weight: 161 lb 3.2 oz (73.1 kg)  Height: 5\' 5"  (1.651 m)   Discussion only visit    Assessment:     .   ICD-9-CM ICD-10-CM   1. Multiple lung nodules on CT 793.19 R91.8   2. Abnormal thyroid uptake 794.5 R94.6 US Thyroid Biopsy     US THYROID       Plan:      # LUNG NODULE CT chest 11/03/15: personally visualised and PET scan 11/08/15 1. LUL nodule is 1.3cm  - very slowly enlarging over time - LOW uptake 2. LLL nodule - 1.8cm - INTERMEDIATE UPDTAKE  Given age, smoking hx and size and slow grwoth - likely we are dealing with BAC. Explaine this to her and her sisters. She is interested in workup. Explained ENB method of Dr Lamonte Sakai and Risks of pneumothorax, hemothorax, sedation/anesthesia complications such as cardiac or respiratory arrest or hypotension, stroke and bleeding all explained. Benefits of diagnosis but limitations of non-diagnosis also explained. Patient verbalized understanding and  wished to proceed. I will get Super-D Ct to Dr Lamonte Sakai.  I have spoken to him. He will review Ct and get back to me if is feasible.    #Thyropid =- get [place] and then US guided bx  > 50% of this > 25 min visit spent in face to face counseling or coordination of care    Dr. Brand Males, M.D., [facility].C.P Pulmonary and Critical Care Medicine Staff Physician [place] [facility] Pager: [phone], If no answer or between  15:00h - 7:00h: call [phone]  11/10/2015 7:24 PM

## 2015-11-10 NOTE — Telephone Encounter (Signed)
Herbie BaltimoreShelly Bombard super d to [employer]  Dr. Brand Males, M.D., [facility].C.P Pulmonary and Critical Care Medicine Staff Physician [place] [facility] Pager: [phone], If no answer or between  15:00h - 7:00h: call [phone]  11/10/2015 7:27 PM

## 2015-11-10 NOTE — Telephone Encounter (Signed)
MR had Daneil Dan to put in the US Thyroid this will be scheduled by [email or web address]  imaging Joellen [place]

## 2015-11-13 ENCOUNTER — Telehealth: Payer: Self-pay | Admitting: Neurology

## 2015-11-13 NOTE — Telephone Encounter (Signed)
Pt called said considering she has a little bit of memory loss how bad would it be if she had a bronchostomy. She has read that it could cause increased memory loss or dementia after anesthesia. Please call

## 2015-11-13 NOTE — Telephone Encounter (Signed)
I called the patient. The patient is concerned about the use of anesthesia and a history of memory problems. If the procedure is deemed necessary, the procedure should be done. The bronchoscopy likely will be done with a "twilight drug" rather than true general anesthesia. I believe the risk to her is low.

## 2015-11-14 ENCOUNTER — Telehealth: Payer: Self-pay | Admitting: Internal Medicine

## 2015-11-14 NOTE — Telephone Encounter (Signed)
Per Ricci Barker uses general anesthesia for his bronch's, so Curlene Dolphin is likely not an option. Also, in pt's referrals it indicates that thyroid ultrasound is to be done and biopsy only if indicated by u/s. MR has deemed these procedures necessary to rule out cancer and should be done as soon as possible.   LMTCB

## 2015-11-14 NOTE — Telephone Encounter (Signed)
Pt aware of information below. Aware that she will get a call when scheduling is ready to be done for the bronch and also for the Thyroid (if needed). Per open telephone note sent to [place] on 11/10/15, RB has the Super D disc and is reviewing. Nothing further needed. Will close this encounter.

## 2015-11-15 NOTE — Telephone Encounter (Signed)
I reviewed the CT chest. There is a L mid-lung proximal lesion that may be difficult to target due to proximal location, but may be reachable. Should be able to reach the L basilar lesion. Based on this I will order ENB to be scheduled. I have superd disk

## 2015-11-16 ENCOUNTER — Ambulatory Visit
Admission: RE | Admit: 2015-11-16 | Discharge: 2015-11-16 | Disposition: A | Discharge status: Home / Self Care | Payer: Medicare Other | Source: Ambulatory Visit | Attending: Internal Medicine | Admitting: Internal Medicine

## 2015-11-16 ENCOUNTER — Other Ambulatory Visit: Payer: Medicare Other

## 2015-11-16 DIAGNOSIS — R946 Abnormal results of thyroid function studies: Secondary | ICD-10-CM

## 2015-11-17 ENCOUNTER — Other Ambulatory Visit: Payer: Self-pay | Admitting: Family Medicine

## 2015-11-17 ENCOUNTER — Telehealth: Payer: Self-pay | Admitting: Internal Medicine

## 2015-11-17 DIAGNOSIS — E041 Nontoxic single thyroid nodule: Secondary | ICD-10-CM

## 2015-11-17 NOTE — Telephone Encounter (Signed)
Please give results . I will releaset to my chart - basically she needs US guided bx of thyroid nodule. Please order it. Please send this encoutner after it is closed to pcp Gerrit Heck, MD  Thanks  Dr. Brand Males, M.D., [facility].C.P Pulmonary and Critical Care Medicine Staff Physician [place] [facility] and Critical [facility] Pager: [phone], If no answer or between  15:00h - 7:00h: call [phone]  11/17/2015 2:23 PM      US Thyroid  Result Date: 11/16/2015 CLINICAL DATA:  82 year old female with asymmetric thyroid activity on the right by recent PET-CT. EXAM: THYROID ULTRASOUND TECHNIQUE: Ultrasound examination of the thyroid gland and adjacent soft tissues was performed. COMPARISON:  PET-CT 11/08/2015; prior thyroid ultrasound 03/05/2010 FINDINGS: Right thyroid lobe Measurements: 4.8 x 2.3 x 2.4 cm (previously 5.0 x 2.4 x 2.0 cm). Solid heterogeneous isoechoic nodule in the medial inferior gland measures 2.3 x 1.5 x 1.9 cm. The background thyroid parenchyma is diffusely heterogeneous. Possible nodule versus pseudo nodule in the medial mid gland measures up to 0.9 cm. Left thyroid lobe Measurements: 4.5 x 1.9 x 2.2 cm (previously 4.5 x 2.3 x 1.9 cm). Isoechoic solid nodule with a peripheral hypoechoic halo in the mid to lower gland measures 0.7 x 0.6 x 0.7 cm. A subtle isoechoic solid nodule in the medial lower gland measures 1.8 x 1.2 x 1.2 cm. No internal echogenic foci. Isthmus Thickness: 0.5 cm, unchanged.  No nodules visualized. Lymphadenopathy None visualized. IMPRESSION: Mildly enlarged, diffusely heterogeneous and lobular thyroid gland the appearance of which is most consistent with diffuse goitrous change. There are several nodules measured today which were not definitively measured on the prior thyroid ultrasound. However, in retrospect similar areas of nodularity were visible at that time. Of note, nodules on today's examination are nearly isoechoic  and may represent focal lobulations of the underlying goitrous thyroid gland rather than true thyroid nodules. However, given the asymmetrically increased FDG uptake in the right thyroid gland consideration should be given to biopsy of the 2.3 cm heterogeneous solid right inferior thyroid nodule. Findings meet consensus criteria for biopsy. Ultrasound-guided fine needle aspiration should be considered, as per the consensus statement: Management of Thyroid Nodules Detected at [place]: Society of Radiologists in [place]. Radiology 2005; [id number]. Electronically Signed   By: Jacqulynn Cadet M.D.   On: 11/16/2015 14:40

## 2015-11-17 NOTE — Telephone Encounter (Signed)
Called and spoke with pt and she stated that she is calling for results of her [place].  MR please advise of these results and she stated that we can sent these results to her through mychart.  thanks

## 2015-11-17 NOTE — Telephone Encounter (Signed)
lmtcb X1 for pt to relay results/recs.  

## 2015-11-20 NOTE — Telephone Encounter (Signed)
lmtcb x2 for pt. 

## 2015-11-20 NOTE — Telephone Encounter (Signed)
Called spoke with pt. Reviewed results and recs. Pt voiced understanding and had no further questions. Order has been placed. Nothing further needed.

## 2015-11-20 NOTE — Telephone Encounter (Signed)
[phone], pt cb

## 2015-11-27 ENCOUNTER — Encounter (HOSPITAL_COMMUNITY): Payer: Self-pay

## 2015-11-27 ENCOUNTER — Encounter (HOSPITAL_COMMUNITY)
Admission: RE | Admit: 2015-11-27 | Discharge: 2015-11-27 | Disposition: A | Discharge status: Home / Self Care | Payer: Medicare Other | Source: Ambulatory Visit | Attending: Emergency Medicine | Admitting: Emergency Medicine

## 2015-11-27 ENCOUNTER — Other Ambulatory Visit (HOSPITAL_COMMUNITY): Payer: Medicare Other

## 2015-11-27 DIAGNOSIS — Z7983 Long term (current) use of bisphosphonates: Secondary | ICD-10-CM | POA: Diagnosis not present

## 2015-11-27 DIAGNOSIS — E785 Hyperlipidemia, unspecified: Secondary | ICD-10-CM | POA: Diagnosis not present

## 2015-11-27 DIAGNOSIS — M810 Age-related osteoporosis without current pathological fracture: Secondary | ICD-10-CM | POA: Diagnosis not present

## 2015-11-27 DIAGNOSIS — Z654 Victim of crime and terrorism: Secondary | ICD-10-CM

## 2015-11-27 DIAGNOSIS — I10 Essential (primary) hypertension: Secondary | ICD-10-CM | POA: Diagnosis not present

## 2015-11-27 DIAGNOSIS — Z9889 Other specified postprocedural states: Secondary | ICD-10-CM

## 2015-11-27 DIAGNOSIS — Z7982 Long term (current) use of aspirin: Secondary | ICD-10-CM | POA: Diagnosis not present

## 2015-11-27 DIAGNOSIS — J449 Chronic obstructive pulmonary disease, unspecified: Secondary | ICD-10-CM | POA: Diagnosis not present

## 2015-11-27 DIAGNOSIS — R918 Other nonspecific abnormal finding of lung field: Secondary | ICD-10-CM | POA: Diagnosis present

## 2015-11-27 DIAGNOSIS — Z87891 Personal history of nicotine dependence: Secondary | ICD-10-CM | POA: Diagnosis not present

## 2015-11-27 DIAGNOSIS — R569 Unspecified convulsions: Secondary | ICD-10-CM | POA: Diagnosis not present

## 2015-11-27 DIAGNOSIS — Z79899 Other long term (current) drug therapy: Secondary | ICD-10-CM | POA: Diagnosis not present

## 2015-11-27 HISTORY — DX: Unspecified osteoarthritis, unspecified site: M19.90

## 2015-11-27 LAB — COMPREHENSIVE METABOLIC PANEL
ALT: 16 U/L (ref 14–54)
AST: 21 U/L (ref 15–41)
Albumin: 4.2 g/dL (ref 3.5–5.0)
Alkaline Phosphatase: 76 U/L (ref 38–126)
Anion gap: 7 (ref 5–15)
BUN: 25 mg/dL — ABNORMAL HIGH (ref 6–20)
CHLORIDE: 105 mmol/L (ref 101–111)
CO2: 27 mmol/L (ref 22–32)
CREATININE: 0.92 mg/dL (ref 0.44–1.00)
Calcium: 9.2 mg/dL (ref 8.9–10.3)
GFR calc non Af Amer: 57 mL/min — ABNORMAL LOW (ref >60)
Glucose, Bld: 78 mg/dL (ref 65–99)
Potassium: 3.8 mmol/L (ref 3.5–5.1)
SODIUM: 139 mmol/L (ref 135–145)
Total Bilirubin: 0.6 mg/dL (ref 0.3–1.2)
Total Protein: 6.6 g/dL (ref 6.5–8.1)

## 2015-11-27 LAB — CBC
HCT: 47.2 % — ABNORMAL HIGH (ref 36.0–46.0)
Hemoglobin: 14.9 g/dL (ref 12.0–15.0)
MCH: 29.7 pg (ref 26.0–34.0)
MCHC: 31.6 g/dL (ref 30.0–36.0)
MCV: 94.2 fL (ref 78.0–100.0)
PLATELETS: 195 10*3/uL (ref 150–400)
RBC: 5.01 MIL/uL (ref 3.87–5.11)
RDW: 13.7 % (ref 11.5–15.5)
WBC: 6.3 10*3/uL (ref 4.0–10.5)

## 2015-11-27 LAB — PROTIME-INR
INR: 0.96
Prothrombin Time: 12.8 seconds (ref 11.4–15.2)

## 2015-11-27 LAB — APTT: APTT: 33 s (ref 24–36)

## 2015-11-27 NOTE — Pre-Procedure Instructions (Signed)
    [name]  11/27/2015      [id number] [address], [place] [address] Phone: [phone] Fax: [place], [place] [address] Phone: [phone] Fax: [phone]    Your procedure is scheduled on 11/29/15.  Report to [facility] Admitting at 630 A.M.  Call this number if you have problems the morning of surgery:  [phone]   Remember:  Do not eat food or drink liquids after midnight.  Take these medicines the morning of surgery with A SIP OF WATER **---all inhalers,keppra,*mirapex   Do not wear jewelry, make-up or nail polish.  Do not wear lotions, powders, or perfumes.  You may wear deoderant.  Do not shave 48 hours prior to surgery.  Men may shave face and neck.  Do not bring valuables to the hospital.  [facility] is not responsible for any belongings or valuables.  Contacts, dentures or bridgework may not be worn into surgery.  Leave your suitcase in the car.  After surgery it may be brought to your room.  For patients admitted to the hospital, discharge time will be determined by your treatment team.  Patients discharged the day of surgery will not be allowed to drive home.   Name and phone number of your driver:    Special instructions:  Do not take any aspirin,anti-inflammatories,vitamins,or herbal supplements 5-7 days prior to surgery.  Please read over the following fact sheets that you were given.

## 2015-11-29 ENCOUNTER — Encounter (HOSPITAL_COMMUNITY)
Admission: RE | Disposition: A | Discharge status: Home / Self Care | Payer: Self-pay | Source: Ambulatory Visit | Attending: Emergency Medicine

## 2015-11-29 ENCOUNTER — Ambulatory Visit (HOSPITAL_COMMUNITY): Payer: Medicare Other | Admitting: Anesthesiology

## 2015-11-29 ENCOUNTER — Ambulatory Visit (HOSPITAL_COMMUNITY): Payer: Medicare Other

## 2015-11-29 ENCOUNTER — Ambulatory Visit (HOSPITAL_COMMUNITY)
Admission: RE | Admit: 2015-11-29 | Discharge: 2015-11-29 | Disposition: A | Discharge status: Home / Self Care | Payer: Medicare Other | Source: Ambulatory Visit | Attending: Emergency Medicine | Admitting: Emergency Medicine

## 2015-11-29 ENCOUNTER — Encounter (HOSPITAL_COMMUNITY): Payer: Self-pay | Admitting: *Deleted

## 2015-11-29 DIAGNOSIS — E785 Hyperlipidemia, unspecified: Secondary | ICD-10-CM | POA: Diagnosis not present

## 2015-11-29 DIAGNOSIS — Z419 Encounter for procedure for purposes other than remedying health state, unspecified: Secondary | ICD-10-CM

## 2015-11-29 DIAGNOSIS — J449 Chronic obstructive pulmonary disease, unspecified: Secondary | ICD-10-CM | POA: Diagnosis not present

## 2015-11-29 DIAGNOSIS — Z7982 Long term (current) use of aspirin: Secondary | ICD-10-CM | POA: Diagnosis not present

## 2015-11-29 DIAGNOSIS — Z7983 Long term (current) use of bisphosphonates: Secondary | ICD-10-CM | POA: Diagnosis not present

## 2015-11-29 DIAGNOSIS — E559 Vitamin D deficiency, unspecified: Secondary | ICD-10-CM

## 2015-11-29 DIAGNOSIS — R569 Unspecified convulsions: Secondary | ICD-10-CM | POA: Diagnosis not present

## 2015-11-29 DIAGNOSIS — Z79899 Other long term (current) drug therapy: Secondary | ICD-10-CM | POA: Insufficient documentation

## 2015-11-29 DIAGNOSIS — I10 Essential (primary) hypertension: Secondary | ICD-10-CM | POA: Diagnosis not present

## 2015-11-29 DIAGNOSIS — Z9889 Other specified postprocedural states: Secondary | ICD-10-CM

## 2015-11-29 DIAGNOSIS — Z87891 Personal history of nicotine dependence: Secondary | ICD-10-CM | POA: Insufficient documentation

## 2015-11-29 DIAGNOSIS — M810 Age-related osteoporosis without current pathological fracture: Secondary | ICD-10-CM | POA: Diagnosis not present

## 2015-11-29 DIAGNOSIS — R918 Other nonspecific abnormal finding of lung field: Secondary | ICD-10-CM | POA: Diagnosis not present

## 2015-11-29 DIAGNOSIS — Z01818 Encounter for other preprocedural examination: Secondary | ICD-10-CM

## 2015-11-29 HISTORY — PX: VIDEO BRONCHOSCOPY WITH ENDOBRONCHIAL NAVIGATION: SHX6175

## 2015-11-29 SURGERY — VIDEO BRONCHOSCOPY WITH ENDOBRONCHIAL NAVIGATION
Anesthesia: General

## 2015-11-29 MED ORDER — FENTANYL CITRATE (PF) 100 MCG/2ML IJ SOLN
INTRAMUSCULAR | Status: DC | PRN
  Administered 2015-11-29 (×2): 50 ug via INTRAVENOUS

## 2015-11-29 MED ORDER — TIOTROPIUM BROMIDE MONOHYDRATE 18 MCG IN CAPS: ORAL_CAPSULE | RESPIRATORY_TRACT | 5 refills | Status: DC

## 2015-11-29 MED ORDER — FENTANYL CITRATE (PF) 100 MCG/2ML IJ SOLN
INTRAMUSCULAR | Status: AC
  Filled 2015-11-29: qty 2

## 2015-11-29 MED ORDER — FENTANYL CITRATE (PF) 100 MCG/2ML IJ SOLN: 25.0000 ug | INTRAMUSCULAR | Status: DC | PRN

## 2015-11-29 MED ORDER — PROPOFOL 10 MG/ML IV BOLUS
INTRAVENOUS | Status: DC | PRN
  Administered 2015-11-29: 120 mg via INTRAVENOUS

## 2015-11-29 MED ORDER — PROMETHAZINE HCL 25 MG/ML IJ SOLN: 6.2500 mg | INTRAMUSCULAR | Status: DC | PRN

## 2015-11-29 MED ORDER — ONDANSETRON HCL 4 MG/2ML IJ SOLN
INTRAMUSCULAR | Status: DC | PRN
  Administered 2015-11-29: 4 mg via INTRAVENOUS

## 2015-11-29 MED ORDER — LIDOCAINE HCL (CARDIAC) 20 MG/ML IV SOLN
INTRAVENOUS | Status: DC | PRN
  Administered 2015-11-29: 70 mg via INTRAVENOUS
  Administered 2015-11-29: 30 mg via INTRAVENOUS

## 2015-11-29 MED ORDER — ROCURONIUM BROMIDE 100 MG/10ML IV SOLN
INTRAVENOUS | Status: DC | PRN
  Administered 2015-11-29: 40 mg via INTRAVENOUS

## 2015-11-29 MED ORDER — PROPOFOL 10 MG/ML IV BOLUS
INTRAVENOUS | Status: AC
  Filled 2015-11-29: qty 20

## 2015-11-29 MED ORDER — EPHEDRINE SULFATE-NACL 50-0.9 MG/10ML-% IV SOSY
PREFILLED_SYRINGE | INTRAVENOUS | Status: DC | PRN
  Administered 2015-11-29: 7.5 mg via INTRAVENOUS

## 2015-11-29 MED ORDER — EPHEDRINE 5 MG/ML INJ
INTRAVENOUS | Status: AC
  Filled 2015-11-29: qty 10

## 2015-11-29 MED ORDER — LAMOTRIGINE 150 MG PO TABS: ORAL_TABLET | ORAL | 5 refills | Status: DC

## 2015-11-29 MED ORDER — PHENYLEPHRINE HCL 10 MG/ML IJ SOLN
INTRAMUSCULAR | Status: DC | PRN
  Administered 2015-11-29: 25 ug/min via INTRAVENOUS

## 2015-11-29 MED ORDER — 0.9 % SODIUM CHLORIDE (POUR BTL) OPTIME
TOPICAL | Status: DC | PRN
  Administered 2015-11-29: 1000 mL

## 2015-11-29 MED ORDER — LIDOCAINE 2% (20 MG/ML) 5 ML SYRINGE
INTRAMUSCULAR | Status: AC
  Filled 2015-11-29: qty 5

## 2015-11-29 MED ORDER — SUGAMMADEX SODIUM 200 MG/2ML IV SOLN
INTRAVENOUS | Status: DC | PRN
  Administered 2015-11-29: 200 mg via INTRAVENOUS

## 2015-11-29 MED ORDER — ONDANSETRON HCL 4 MG/2ML IJ SOLN
INTRAMUSCULAR | Status: AC
  Filled 2015-11-29: qty 2

## 2015-11-29 MED ORDER — SUGAMMADEX SODIUM 200 MG/2ML IV SOLN
INTRAVENOUS | Status: AC
  Filled 2015-11-29: qty 2

## 2015-11-29 MED ORDER — ROCURONIUM BROMIDE 10 MG/ML (PF) SYRINGE
PREFILLED_SYRINGE | INTRAVENOUS | Status: AC
  Filled 2015-11-29: qty 10

## 2015-11-29 MED ORDER — LEVETIRACETAM 500 MG PO TABS: ORAL_TABLET | ORAL | 0 refills | Status: DC

## 2015-11-29 MED ORDER — VITAMIN D 1000 UNITS PO TABS: 1000.0000 [IU] | ORAL_TABLET | Freq: Every day | ORAL | Status: DC

## 2015-11-29 MED ORDER — LACTATED RINGERS IV SOLN
INTRAVENOUS | Status: DC
  Administered 2015-11-29: 08:00:00 via INTRAVENOUS

## 2015-11-29 SURGICAL SUPPLY — 42 items
ADAPTER BRONCH F/PENTAX (ADAPTER) ×2 IMPLANT
BRUSH BIOPSY BRONCH 10 SDTNB (MISCELLANEOUS) ×2 IMPLANT
BRUSH CYTOL CELLEBRITY 1.5X140 (MISCELLANEOUS) ×6 IMPLANT
BRUSH SUPERTRAX BIOPSY (INSTRUMENTS) IMPLANT
BRUSH SUPERTRAX NDL-TIP CYTO (INSTRUMENTS) ×2 IMPLANT
CANISTER SUCTION 2500CC (MISCELLANEOUS) ×2 IMPLANT
CHANNEL WORK EXTEND EDGE 180 (KITS) IMPLANT
CHANNEL WORK EXTEND EDGE 45 (KITS) IMPLANT
CHANNEL WORK EXTEND EDGE 90 (KITS) IMPLANT
CONT SPEC 4OZ CLIKSEAL STRL BL (MISCELLANEOUS) ×4 IMPLANT
COVER TABLE BACK 60X90 (DRAPES) ×2 IMPLANT
EXCELON TRANSBRONCHIAL ASPIRATION NEEDLE IMPLANT
FILTER STRAW FLUID ASPIR (MISCELLANEOUS) IMPLANT
FORCEPS BIOP SUPERTRX PREMAR (INSTRUMENTS) ×2 IMPLANT
GAUZE SPONGE 4X4 12PLY STRL (GAUZE/BANDAGES/DRESSINGS) ×2 IMPLANT
GLOVE BIO SURGEON STRL SZ7.5 (GLOVE) IMPLANT
GLOVE BIOGEL PI IND STRL 7.0 (GLOVE) ×1 IMPLANT
GLOVE BIOGEL PI IND STRL 7.5 (GLOVE) ×1 IMPLANT
GLOVE BIOGEL PI INDICATOR 7.0 (GLOVE) ×1
GLOVE BIOGEL PI INDICATOR 7.5 (GLOVE) ×1
KIT CLEAN ENDO COMPLIANCE (KITS) ×2 IMPLANT
KIT LOCATABLE GUIDE (CANNULA) IMPLANT
KIT MARKER FIDUCIAL DELIVERY (KITS) IMPLANT
KIT PROCEDURE EDGE 180 (KITS) IMPLANT
KIT PROCEDURE EDGE 45 (KITS) IMPLANT
KIT PROCEDURE EDGE 90 (KITS) ×2 IMPLANT
KIT ROOM TURNOVER OR (KITS) ×2 IMPLANT
MARKER SKIN DUAL TIP RULER LAB (MISCELLANEOUS) ×2 IMPLANT
NEEDLE BIOPSY TRANSBRONCH 21G (NEEDLE) ×2 IMPLANT
NEEDLE SUPERTRX PREMARK BIOPSY (NEEDLE) ×2 IMPLANT
NS IRRIG 1000ML POUR BTL (IV SOLUTION) ×2 IMPLANT
OIL SILICONE PENTAX (PARTS (SERVICE/REPAIRS)) ×2 IMPLANT
PAD ARMBOARD 7.5X6 YLW CONV (MISCELLANEOUS) ×4 IMPLANT
PATCHES PATIENT (LABEL) ×6 IMPLANT
SYR 20CC LL (SYRINGE) ×2 IMPLANT
SYR 20ML ECCENTRIC (SYRINGE) ×2 IMPLANT
SYR 50ML SLIP (SYRINGE) ×2 IMPLANT
SYR CONTROL 10ML LL (SYRINGE) ×2 IMPLANT
TOWEL OR 17X24 6PK STRL BLUE (TOWEL DISPOSABLE) ×2 IMPLANT
TRAP SPECIMEN MUCOUS 40CC (MISCELLANEOUS) IMPLANT
TUBE CONNECTING 20X1/4 (TUBING) ×2 IMPLANT
WATER STERILE IRR 1000ML POUR (IV SOLUTION) ×2 IMPLANT

## 2015-11-29 NOTE — Anesthesia Preprocedure Evaluation (Addendum)
Anesthesia Evaluation  Patient identified by MRN, date of birth, ID band Patient awake    Reviewed: Allergy & Precautions, NPO status , Patient's Chart, lab work & pertinent test results  Airway Mallampati: II  TM Distance: >3 FB Neck ROM: Full    Dental  (+) Edentulous Upper, Edentulous Lower   Pulmonary shortness of breath, COPD,  COPD inhaler, former smoker,    Pulmonary exam normal        Cardiovascular hypertension, Normal cardiovascular exam     Neuro/Psych Seizures -,  PSYCHIATRIC DISORDERS Depression    GI/Hepatic negative GI ROS, Neg liver ROS,   Endo/Other    Renal/GU negative Renal ROS     Musculoskeletal  (+) Arthritis ,   Abdominal   Peds  Hematology   Anesthesia Other Findings Thyroid mass  Reproductive/Obstetrics                            Anesthesia Physical Anesthesia Plan  ASA: III  Anesthesia Plan: General   Post-op Pain Management:    Induction: Intravenous  Airway Management Planned: Oral ETT  Additional Equipment:   Intra-op Plan:   Post-operative Plan: Extubation in OR  Informed Consent: I have reviewed the patients History and Physical, chart, labs and discussed the procedure including the risks, benefits and alternatives for the proposed anesthesia with the patient or authorized representative who has indicated his/her understanding and acceptance.   Dental advisory given  Plan Discussed with: CRNA and Surgeon  Anesthesia Plan Comments:        Anesthesia Quick Evaluation

## 2015-11-29 NOTE — Research (Signed)
Prospective Registry to Evaluate Percepta Bronchial Genomic Classifier Patient Data: The [place] Registry Informed Consent   Subject Name: [name]  This patient, [name], has been consented to the above clinical trial according to FDA regulations, GCP guidelines and [employer]'s SOPs. The informed consent form and study design have been explained to this patient by this study coordinator at [place] on 11/29/2015.  This had also previously been reviewed with the subject over the phone on 18Aug2017 by this RN, and the informed consent form mailed to the subject to review in advance.  The patient demonstrated comprehension of this clinical trial and study requirements/expectations. No study procedures have been initiated before consenting of this patient. The patient was given sufficient time for reading the consent form. All risks, benefits and options have been thoroughly discussed and all questions were answered per the patient's satisfaction. This patient was not coerced in any way to participate in this clinical trial. This patient has voluntarily signed consent version 3 (revision date 08/11) at 0740am on 11/29/2015. A copy of the signed consent form was given to the patient.  Doreatha Martin, RN Research Nurse Office [phone]

## 2015-11-29 NOTE — Interval H&P Note (Signed)
PCCM Interval Note  [name] presents today for further eval of her L sided nodules.  PET scan from 11/08/15 reviewed She is doing well, no new issues, no new meds.  Explained the procedure, risks, benefits in full She has also signed consent to participate in percepta registry.   Vitals:   11/29/15 0648 11/29/15 0654  BP:  (!) 160/89  Pulse:  93  Resp:  18  Temp:  98.5 F (36.9 C)  TempSrc:  Oral  SpO2:  95%  Weight: 73.9 kg (163 lb) 73.9 kg (163 lb)  Height: 5' 4.5" (1.638 m)    Gen: Pleasant, well-nourished, in no distress,  normal affect  ENT: No lesions,  mouth clear,  oropharynx clear, m3 airway  Neck: No JVD, no TMG, no carotid bruits  Lungs: No use of accessory muscles, clear without rales or rhonchi  Cardiovascular: RRR, heart sounds normal, no murmur or gallops, no peripheral edema  Musculoskeletal: No deformities, no cyanosis or clubbing  Neuro: alert, non focal  Skin: Warm, no lesions or rashes    Recent Labs Lab 11/27/15 1325  HGB 14.9  HCT 47.2*  WBC 6.3  PLT 195    Recent Labs Lab 11/27/15 1325  NA 139  K 3.8  CL 105  CO2 27  GLUCOSE 78  BUN 25*  CREATININE 0.92  CALCIUM 9.2    Recent Labs Lab 11/27/15 1325  INR 0.96    Plan:  ENB to the LUL and LLL nodular infiltrates.  Percepta testing for risk assessment data  Baltazar Apo, MD, PhD 11/29/2015, 8:29 AM [facility] [phone] or if no answer [phone]

## 2015-11-29 NOTE — H&P (View-Only) (Signed)
Subjective:     Patient ID: [name], female   DOB: December 14, 1932, 82 y.o.   MRN: [id number]  HPI  OV 10/30/2015  Chief Complaint  Patient presents with  . Follow-up    Pt c/o occasional dry cough. Pt denies SOB/wheeze/CP/tightness. Pt states that as long as she uses her inhalers she does very well.    82 year old female follow-up for multiple issues  #Emphysema isolated low DLCO and CT evidence without out of proportion shortness of breath in setting of normal cardiac stress test and echocardiogram: At last visit 6 months ago she felt that pulmonary rehabilitation would not be that useful and would prefer to do it as a physical therapy in her friend's home. She did not do the physical therapy up until 3 weeks ago when she is doing it more for balance issues. Nevertheless she states dyspnea stable. I discovered today that she is only taking Spiriva scheduled. She is only taking the Brio as needed which she is taking 3 times a month. She does not want to do albuterol for rescue but instead prefers the [facility] for needing to "feel safe"  #Multiple lung nodules: Last CT chest without contrast was December 2016. Nodules were stable one of them had resolved and one of them had diminished. Recommendation was to do CT chest December 2017. But in July 2017. Abdominal CT and this shows an enlarging left lower lobe pulmonary nodule subpleural groundglass opacity. This was compared to March 2016. She is concerned about this. She does not have hemoptysis or weight loss. I personally visualized the CT chest and agree    OV 11/10/2015  Chief Complaint  Patient presents with  . Follow-up    Pt here after PET scan. Pt denies breathing changes since last OV. Pt denies any new complaints at this time.    Presents with all her sisters to discuss results of Ct chest abnd PET scan  # LUNG NODULE CT chest 11/03/15: personally visualised and PET scan 11/08/15 1. LUL nodule is 1.3cm  - very slowly enlarging over  time - LOW uptake 2. LLL nodule - 1.8cm - INTERMEDIATE UPDTAKE  Given age, smoking hx and size and slow grwoth - likely we are dealing with BAC  #New issue  - PET shows high uptake in right side of thyroid   Ct Chest High Resolution  Result Date: 11/03/2015 CLINICAL DATA:  82 year old female with history of pulmonary nodule. Followup study. Former smoker (20 years). Shortness breath for 1 week. EXAM: CT CHEST WITHOUT CONTRAST TECHNIQUE: Multidetector CT imaging of the chest was performed following the standard protocol without intravenous contrast. High resolution imaging of the lungs, as well as inspiratory and expiratory imaging, was performed. COMPARISON:  Multiple priors, most recently chest CT 03/30/2015. FINDINGS: Cardiovascular: Heart size is normal. There is no significant pericardial fluid, thickening or pericardial calcification. There is aortic atherosclerosis, as well as atherosclerosis of the great vessels of the mediastinum and the coronary arteries, including calcified atherosclerotic plaque in the left main, left anterior descending and right coronary arteries. Mediastinum/Nodes: No pathologically enlarged mediastinal or hilar lymph nodes. Please note that accurate exclusion of hilar adenopathy is limited on noncontrast CT scans. Esophagus is unremarkable in appearance. No axillary lymphadenopathy. Lungs/Pleura: In the central aspect of the left upper lobe there is a mixed ground-glass attenuation and solid nodule, which currently measures 13 x 11 x 11 mm (image 51 of series 4 and coronal image 63 of series 601), which has a central solid  component measuring up to 1 cm (axial image 52 of series 3). Previously noted pleural-based nodule in the inferior aspect of the left lower lobe is similar to the recent prior examination of the abdomen and pelvis dated 10/20/2015 measuring 18 x 12 x 6 mm (axial image 82 of series 4 and coronal image 100 of series 601). This lesion has slowly  progressively enlarged over serial examinations, and is concerning for slow-growing neoplasm such as an adenocarcinoma. A few other scattered tiny 2-3 mm pulmonary nodules are noted throughout the lungs bilaterally, predominantly in the periphery, similar prior examinations, most compatible with areas of chronic mucoid impaction within terminal bronchioles. No acute consolidative airspace disease. No pleural effusions. Diffuse bronchial wall thickening with mild to moderate centrilobular emphysema. Stable 9 x 8 mm pulmonary nodule in the posterior aspect of the lateral segment of the right middle lobe abutting the major fissure (image 68 of series 4). Mild eventration of the posterolateral aspect of the left hemidiaphragm. Upper Abdomen: 4.2 cm intermediate attenuation lesion in the central aspect of the liver and 1.3 cm intermediate attenuation lesion in segment 8 of the liver corresponds to known at cavernous hemangiomas. Aortic atherosclerosis. Musculoskeletal: Old T12 compression fracture with approximately 50% loss of anterior vertebral body height, unchanged. There are no aggressive appearing lytic or blastic lesions noted in the visualized portions of the skeleton. IMPRESSION: 1. There are 2 concerning lesions in the left lung, including a slowly progressively enlarging mixed solid and subsolid lesion in the left upper lobe (image 52 of series 4) which is concerning for potential slow-growing neoplasm such as adenocarcinoma, as well as a pleural-based lesion in the left lower lobe. Further evaluation of these lesions with PET-CT is recommended in the near future for further diagnostic purposes. 2. Diffuse bronchial wall thickening with mild to moderate centrilobular emphysema; imaging findings concerning for potential COPD. 3. Aortic atherosclerosis, in addition to left main and 2 vessel coronary artery disease. 4. There are calcifications of the aortic valve. Echocardiographic correlation for evaluation of  potential valvular dysfunction may be warranted if clinically indicated. 5. Additional incidental findings, as above. Electronically Signed   By: Vinnie Langton M.D.   On: 11/03/2015 16:38    Nm Pet Image Initial (pi) Skull Base To Thigh  Result Date: 11/08/2015 CLINICAL DATA:  Initial treatment strategy for lung nodule. EXAM: NUCLEAR MEDICINE PET SKULL BASE TO THIGH TECHNIQUE: 11.9 mCi F-18 FDG was injected intravenously. Full-ring PET imaging was performed from the skull base to thigh after the radiotracer. CT data was obtained and used for attenuation correction and anatomic localization. FASTING BLOOD GLUCOSE:  Value: 84 mg/dl COMPARISON:  CT chest 11/03/2015. FINDINGS: NECK No hypermetabolic lymph nodes in the neck. Asymmetric FDG uptake is identified in the right thyroid nodule which was seen on previous imaging study to be multi nodular. FDG uptake can be increase in the setting of benign thyroid disease (adenoma) but considered thyroid ultrasound to further evaluate. CHEST No hypermetabolic mediastinal or hilar nodes. The 13 x 11 x 11 mm central left upper lobe makes solid and sub solid nodule seen on recent CT scan shows low level FDG uptake with SUV max = 1.5. Previously described pleural-based nodule in the inferior left lower lobe also shows low level hypermetabolism with SUV max = 2.3. Changes of emphysema again noted. The 9 mm nodule along the right major fissure noted on the previous study shows no evidence for FDG uptake above background levels on today's study. ABDOMEN/PELVIS No abnormal  hypermetabolic activity within the liver, pancreas, adrenal glands, or spleen. No hypermetabolic lymph nodes in the abdomen or pelvis. Gallbladder surgically absent. No evidence for hypermetabolism in the patient's known hepatic cavernous hemangioma. Nonobstructing right renal stone again noted. There is abdominal aortic atherosclerosis without aneurysm. Small ventral hernia lower midline abdominal wall  contains small bowel loops without complicating features. SKELETON No focal hypermetabolic activity to suggest skeletal metastasis. IMPRESSION: 1. The 13 mm central left upper lobe lesion shows low level FDG accumulation suggestive of an infectious/inflammatory etiology. Low-grade or well differentiated neoplasm can be poorly FDG avid and continued surveillance recommended. 2. 18 mm left lower lobe pleural-based nodule shows uptake levels borderline between neoplastic and infectious/inflammatory causes. This could certainly represent low-grade or well differentiated neoplasm especially in the setting of slow progression over cereal exams as described in the recent diagnostic CT chest report. 3. Hypermetabolism right thyroid gland with diffuse thyroid nodularity seen on CT images. FDG uptake can be seen in thyroid parenchyma in the setting of benign disease and thyroid ultrasound may prove helpful to further evaluate. Electronically Signed   By: Misty Stanley M.D.   On: 11/08/2015 13:13       has a past medical history of Acute bronchitis (05/23/2011); Acute upper respiratory infections of unspecified site (05/23/2011); Chronic airway obstruction, not elsewhere classified (05/23/2011); Disturbance of salivary secretion (01/31/2011); Dizziness and giddiness (01/31/2011); Essential tremor (04/25/2014); External hemorrhoids without mention of complication ([id number]); Gait disorder (04/25/2014); Insomnia, unspecified (09/12/2011); Lumbago (01/31/2011); Major depressive disorder, single episode, unspecified ([place]) (01/31/2011); Memory disorder (04/25/2014); Mitral valve disorders (01/31/2011); Other and unspecified hyperlipidemia (01/31/2011); Other convulsions (01/31/2011); Other emphysema ([place]) (01/31/2011); Pain in joint, site unspecified (01/31/2011); Restless legs syndrome (RLS) (09/12/2011); Retinal detachment with retinal defect of right eye (2011); Senile osteoporosis (01/31/2011); Spontaneous ecchymoses (01/31/2011);  Stiffness of joints, not elsewhere classified, multiple sites (01/31/2011); and Unspecified essential hypertension (01/31/2011).   reports that she quit smoking about 27 years ago. Her smoking use included Cigarettes. She has a 40.00 pack-year smoking history. She has never used smokeless tobacco.  Past Surgical History:  Procedure Laterality Date  . ABDOMINAL HYSTERECTOMY  06/21/2003   TAH/BSO, omenectomy PSB resect, Stg IC cystadenofibroma  . CHOLECYSTECTOMY  2005   Dr. Marlou Starks  . ELBOW SURGERY Right 2008   broken   Dr. Apolonio Schneiders  . EYE SURGERY    . RETINAL DETACHMENT SURGERY N/A    two  . ROTATOR CUFF REPAIR Right 2012   Dr. Theda Sers  . SQUAMOUS CELL CARCINOMA EXCISION Bilateral 2012, 8/14   [place] on legs   Dr. Sarajane Jews  . TONSILLECTOMY  1941    Allergies  Allergen Reactions  . Sulfa Antibiotics Nausea And Vomiting  . Tetracyclines & Related     Immunization History  Administered Date(s) Administered  . Influenza Split 01/06/2014  . Influenza Whole 01/07/2012, 01/06/2013  . Influenza,inj,Quad PF,36+ Mos 12/21/2014  . Pneumococcal Conjugate-13 02/01/2014  . Pneumococcal Polysaccharide-23 04/08/2004  . Td 04/08/2002  . Tdap 04/09/2011  . Zoster 04/08/2008    Family History  Problem Relation Age of Onset  . Heart disease Father     CHF  . Cancer Mother     breast  . Seizures Sister      Current Outpatient Prescriptions:  .  albuterol (PROVENTIL HFA;VENTOLIN HFA) 108 (90 BASE) MCG/ACT inhaler, Inhale 2 puffs into the lungs every 4 (four) hours as needed for wheezing or shortness of breath., Disp: 8 g, Rfl: 5 .  alclomethasone (ACLOVATE) 0.05 % cream,  Apply 1 application topically as needed (3 x weekly). , Disp: , Rfl:  .  alendronate (FOSAMAX) 70 MG tablet, Take 1 tablet by mouth once a week., Disp: , Rfl:  .  aspirin 81 MG tablet, Take 81 mg by mouth daily., Disp: , Rfl:  .  cholecalciferol (VITAMIN D) 1000 UNITS tablet, One daily for Vitamin D supplementation., Disp:  100 tablet, Rfl: 5 .  hydrocortisone 2.5 % cream, Apply 1 application topically as needed., Disp: , Rfl:  .  ketoconazole (NIZORAL) 2 % cream, Apply 1 application topically as needed., Disp: , Rfl:  .  lamoTRIgine (LAMICTAL) 150 MG tablet, TAKE 1 TABLET TWICE DAILY AND TAKE 1/2 TABLET AT LUNCH., Disp: 225 tablet, Rfl: 5 .  levETIRAcetam (KEPPRA) 500 MG tablet, TAKE (1/2) TABLET THREE TIMES DAILY., Disp: 135 tablet, Rfl: 0 .  memantine (NAMENDA) 10 MG tablet, Take 1 tablet (10 mg total) by mouth 2 (two) times daily., Disp: 180 tablet, Rfl: 3 .  pramipexole (MIRAPEX) 0.25 MG tablet, Take 1 tablet (0.25 mg total) by mouth daily., Disp: 90 tablet, Rfl: 3 .  simvastatin (ZOCOR) 10 MG tablet, Take 10 mg by mouth daily. Reported on 06/27/2015, Disp: , Rfl:  .  SPIRIVA HANDIHALER 18 MCG inhalation capsule, INHALE CONTENTS OF ONE CAPSULE ONCE DAILY FOR COPD., Disp: 30 capsule, Rfl: 5   Review of Systems     Objective:   Physical Exam Vitals:   11/10/15 1037  BP: (!) 148/88  Pulse: 86  SpO2: 95%  Weight: 161 lb 3.2 oz (73.1 kg)  Height: 5\' 5"  (1.651 m)   Discussion only visit    Assessment:     .   ICD-9-CM ICD-10-CM   1. Multiple lung nodules on CT 793.19 R91.8   2. Abnormal thyroid uptake 794.5 R94.6 US Thyroid Biopsy     US THYROID       Plan:      # LUNG NODULE CT chest 11/03/15: personally visualised and PET scan 11/08/15 1. LUL nodule is 1.3cm  - very slowly enlarging over time - LOW uptake 2. LLL nodule - 1.8cm - INTERMEDIATE UPDTAKE  Given age, smoking hx and size and slow grwoth - likely we are dealing with BAC. Explaine this to her and her sisters. She is interested in workup. Explained ENB method of Dr Lamonte Sakai and Risks of pneumothorax, hemothorax, sedation/anesthesia complications such as cardiac or respiratory arrest or hypotension, stroke and bleeding all explained. Benefits of diagnosis but limitations of non-diagnosis also explained. Patient verbalized understanding and  wished to proceed. I will get Super-D Ct to Dr Lamonte Sakai.  I have spoken to him. He will review Ct and get back to me if is feasible.    #Thyropid =- get [place] and then US guided bx  > 50% of this > 25 min visit spent in face to face counseling or coordination of care    Dr. Brand Males, M.D., [facility].C.P Pulmonary and Critical Care Medicine Staff Physician [facility] Pager: [phone], If no answer or between  15:00h - 7:00h: call [phone]  11/10/2015 7:24 PM

## 2015-11-29 NOTE — Anesthesia Postprocedure Evaluation (Signed)
Anesthesia Post Note  Patient: [name]  Procedure(s) Performed: Procedure(s) (LRB): VIDEO BRONCHOSCOPY WITH ENDOBRONCHIAL NAVIGATION (N/A)  Patient location during evaluation: PACU Anesthesia Type: General Level of consciousness: awake and alert Pain management: pain level controlled Vital Signs Assessment: post-procedure vital signs reviewed and stable Respiratory status: spontaneous breathing, nonlabored ventilation, respiratory function stable and patient connected to nasal cannula oxygen Cardiovascular status: blood pressure returned to baseline and stable Postop Assessment: no signs of nausea or vomiting Anesthetic complications: no    Last Vitals:  Vitals:   11/29/15 1030 11/29/15 1032  BP:  (!) 141/81  Pulse: 84 85  Resp: 15 19  Temp:      Last Pain:  Vitals:   11/29/15 1003  TempSrc:   PainSc: 0-No pain                 Reginal Lutes

## 2015-11-29 NOTE — Anesthesia Procedure Notes (Signed)
Procedure Name: Intubation Date/Time: 11/29/2015 8:41 AM Performed by: Kyung Rudd Pre-anesthesia Checklist: Patient identified, Emergency Drugs available, Suction available, Patient being monitored and Timeout performed Patient Re-evaluated:Patient Re-evaluated prior to inductionOxygen Delivery Method: Circle system utilized Preoxygenation: Pre-oxygenation with 100% oxygen Intubation Type: IV induction Ventilation: Mask ventilation without difficulty Laryngoscope Size: Mac and 3 Grade View: Grade I Tube type: Oral Tube size: 8.5 mm Number of attempts: 1 Airway Equipment and Method: Stylet and LTA kit utilized Placement Confirmation: ETT inserted through vocal cords under direct vision,  positive ETCO2 and breath sounds checked- equal and bilateral Secured at: 22 cm Tube secured with: Tape Dental Injury: Teeth and Oropharynx as per pre-operative assessment

## 2015-11-29 NOTE — Transfer of Care (Signed)
Immediate Anesthesia Transfer of Care Note  Patient: [name]  Procedure(s) Performed: Procedure(s): VIDEO BRONCHOSCOPY WITH ENDOBRONCHIAL NAVIGATION (N/A)  Patient Location: PACU  Anesthesia Type:General  Level of Consciousness: awake, alert , oriented and patient cooperative  Airway & Oxygen Therapy: Patient Spontanous Breathing and Patient connected to face mask oxygen  Post-op Assessment: Report given to RN and Post -op Vital signs reviewed and stable  Post vital signs: Reviewed and stable  Last Vitals:  Vitals:   11/29/15 0654  BP: (!) 160/89  Pulse: 93  Resp: 18  Temp: 36.9 C    Last Pain:  Vitals:   11/29/15 0654  TempSrc: Oral  PainSc:       Patients Stated Pain Goal: 3 ([id number])  Complications: No apparent anesthesia complications

## 2015-11-29 NOTE — Op Note (Signed)
Video Bronchoscopy with Electromagnetic Navigation Procedure Note  Date of Operation: 11/29/2015  Pre-op Diagnosis: LUL and LL nodules  Post-op Diagnosis: same  Surgeon: Baltazar Apo  Assistants: none  Anesthesia: General endotracheal anesthesia  Operation: Flexible video fiberoptic bronchoscopy with electromagnetic navigation and biopsies.  Estimated Blood Loss: Minimal  Complications: none apparent  Indications and History: [name] is a 82 y.o. female with hx tobacco, noted to have a small LUL nodule and a pleural based opacity. Recommendation made to achieve tissue sampling via navigational bronchoscopy The risks, benefits, complications, treatment options and expected outcomes were discussed with the patient.  The possibilities of pneumothorax, pneumonia, reaction to medication, pulmonary aspiration, perforation of a viscus, bleeding, failure to diagnose a condition and creating a complication requiring transfusion or operation were discussed with the patient who freely signed the consent.    Description of Procedure: The patient was seen in the Preoperative Area, was examined and was deemed appropriate to proceed.  The patient was taken to OR10, identified as [name] and the procedure verified as Flexible Video Fiberoptic Bronchoscopy.  A Time Out was held and the above information confirmed.   Prior to the date of the procedure a high-resolution CT scan of the chest was performed. Utilizing [place] a virtual tracheobronchial tree was generated to allow the creation of distinct navigation pathways to the patient's parenchymal abnormalities. After being taken to the operating room general anesthesia was initiated and the patient  was orally intubated. The video fiberoptic bronchoscope was introduced via the endotracheal tube and a general inspection was performed which showed normal airways throughout. Two endobronchial brushings were performed to be sent  for Percepta molecular testing.  The extendable working channel and locator guide were introduced into the bronchoscope. The distinct navigation pathways prepared prior to this procedure were then utilized to navigate to within 0.5 of patient's LUL lesion identified on CT scan. The extendable working channel was secured into place and the locator guide was withdrawn. Under fluoroscopic guidance transbronchial needle brushings, transbronchial Wang needle biopsies, and transbronchial forceps biopsies were performed to be sent for cytology and pathology. A bronchioalveolar lavage was performed in the LUL and sent for cytology and microbiology (bacterial, fungal, AFB smears and cultures). A navigation attempt to the LLL pleural opacity was unsuccessful. At the end of the procedure a general airway inspection was performed and there was no evidence of active bleeding. The bronchoscope was removed.  The patient tolerated the procedure well. There was no significant blood loss and there were no obvious complications. A post-procedural chest x-ray is pending.  Samples: 1. Transbronchial needle brushings from LUL 2. Transbronchial Wang needle biopsies from LUL 3. Transbronchial forceps biopsies from LUL 4. Bronchoalveolar lavage from LUL  Plans:  The patient will be discharged from the PACU to home when recovered from anesthesia and after chest x-ray is reviewed. We will review the cytology, pathology and microbiology results with the patient when they become available. Outpatient followup will be with Dr Lamonte Sakai or Dr Chase Caller.    Baltazar Apo, MD, PhD 11/29/2015, 10:04 AM [facility] [phone] or if no answer [phone]

## 2015-11-29 NOTE — OR Nursing (Signed)
MD Byrum took specimen for [place] Study during bronchoscopy

## 2015-11-29 NOTE — Discharge Instructions (Signed)
Flexible Bronchoscopy, Care After °These instructions give you information on caring for yourself after your procedure. Your doctor may also give you more specific instructions. Call your doctor if you have any problems or questions after your procedure. °HOME CARE °· Do not eat or drink anything for 2 hours after your procedure. If you try to eat or drink before the medicine wears off, food or drink could go into your lungs. You could also burn yourself. °· After 2 hours have passed and when you can cough and gag normally, you may eat soft food and drink liquids slowly. °· The day after the test, you may eat your normal diet. °· You may do your normal activities. °· Keep all doctor visits. °GET HELP RIGHT AWAY IF: °· You get more and more short of breath. °· You get light-headed. °· You feel like you are going to pass out (faint). °· You have chest pain. °· You have new problems that worry you. °· You cough up more than a little blood. °· You cough up more blood than before. °MAKE SURE YOU: °· Understand these instructions. °· Will watch your condition. °· Will get help right away if you are not doing well or get worse. °  °This information is not intended to replace advice given to you by your health care provider. Make sure you discuss any questions you have with your health care provider. ° °Please call our office for any questions or concerns. [phone].  °  °Document Released: 01/20/2009 Document Revised: 03/30/2013 Document Reviewed: 11/27/2012 °Elsevier Interactive Patient Education ©2016 [employer] ° °

## 2015-11-30 LAB — ACID FAST SMEAR (AFB, MYCOBACTERIA): Acid Fast Smear: NEGATIVE

## 2015-11-30 LAB — ACID FAST SMEAR (AFB)

## 2015-12-01 ENCOUNTER — Telehealth: Payer: Self-pay | Admitting: Emergency Medicine

## 2015-12-01 NOTE — Telephone Encounter (Signed)
I discussed the results with the pt by phone today. All cytology and pathology negative so far. cx data is pending. Percepta will be sent off for risk stratification and we will follow for this result. Suspect that she will now undergo surveillance with serial CT scans, possibly another procedure if suspicion rises. I will forward this FYI to DR [facility]

## 2015-12-02 LAB — CULTURE, RESPIRATORY

## 2015-12-02 LAB — CULTURE, RESPIRATORY W GRAM STAIN
Culture: NO GROWTH
Gram Stain: NONE SEEN

## 2015-12-04 ENCOUNTER — Ambulatory Visit (HOSPITAL_COMMUNITY)
Admission: RE | Admit: 2015-12-04 | Discharge: 2015-12-04 | Disposition: A | Discharge status: Home / Self Care | Payer: Medicare Other | Source: Ambulatory Visit | Attending: Internal Medicine | Admitting: Internal Medicine

## 2015-12-04 ENCOUNTER — Encounter (HOSPITAL_COMMUNITY): Payer: Self-pay | Admitting: Emergency Medicine

## 2015-12-04 DIAGNOSIS — E042 Nontoxic multinodular goiter: Secondary | ICD-10-CM | POA: Insufficient documentation

## 2015-12-04 DIAGNOSIS — E041 Nontoxic single thyroid nodule: Secondary | ICD-10-CM | POA: Diagnosis present

## 2015-12-04 MED ORDER — LIDOCAINE HCL (PF) 1 % IJ SOLN
INTRAMUSCULAR | Status: AC
  Filled 2015-12-04: qty 10

## 2015-12-04 NOTE — Procedures (Addendum)
US guided FNA x 4 performed right inferior thyroid nodule via 25 gauge needles . No immediate complications. Medication used- 1% lidocaine to skin/SQ tissue. Path pending.

## 2015-12-04 NOTE — Telephone Encounter (Signed)
Thanks Rob. I will be seeing her 12/21/15. No need to reply back  Dr. Brand Males, M.D., [facility].C.P Pulmonary and Critical Care Medicine Staff Physician [facility] Pager: [phone], If no answer or between  15:00h - 7:00h: call [phone]  12/04/2015 9:18 AM

## 2015-12-06 LAB — ANAEROBIC CULTURE

## 2015-12-08 ENCOUNTER — Telehealth: Payer: Self-pay | Admitting: Internal Medicine

## 2015-12-08 DIAGNOSIS — E041 Nontoxic single thyroid nodule: Secondary | ICD-10-CM

## 2015-12-08 NOTE — Telephone Encounter (Signed)
Per RB in phone note on 12/01/15 results were reviewed with pt.  Collene Gobble, MD      12:37 PM  Note    I discussed the results with the pt by phone today. All cytology and pathology negative so far. cx data is pending. Percepta will be sent off for risk stratification and we will follow for this result. Suspect that she will now undergo surveillance with serial CT scans, possibly another procedure if suspicion rises. I will forward this FYI to DR Ramaswamy     LVM for pt to return call.

## 2015-12-12 NOTE — Telephone Encounter (Signed)
Left detailed message on answering machine per pt request regarding thyroid biopsy results per Dr Chase Caller.  Endocrine referral order placed.  Left pt instructions to call us back if further questions.

## 2015-12-12 NOTE — Telephone Encounter (Signed)
Pt returning call pt said that it is ok to leave msg on answering machine unless it's really really bad new.[name]

## 2015-12-12 NOTE — Telephone Encounter (Signed)
lmomtcb x1 

## 2015-12-12 NOTE — Telephone Encounter (Signed)
Atypical cells of undetermined significance - please refer to endocrine - Dr Carney Harder at [facility] or DR Chalmers Cater at [facility] and send results to  pcp Gerrit Heck, MD

## 2015-12-12 NOTE — Telephone Encounter (Signed)
[phone] pt calling back

## 2015-12-12 NOTE — Telephone Encounter (Signed)
Spoke with pt, requesting thyroid biopsy results from 12/04/15.  This biopsy was performed by MR.  MR please advise on results.  Thanks!

## 2015-12-12 NOTE — Telephone Encounter (Signed)
lmomtcb x2 for pt 

## 2015-12-13 ENCOUNTER — Telehealth: Payer: Self-pay | Admitting: Internal Medicine

## 2015-12-13 NOTE — Telephone Encounter (Signed)
Spoke with patient-aware that MR is not available at this time but I will send message to him to advise on.   MR please advise if you feel patient can wait to see Endocrinology for thyroid until after her F/U with you on 01-15-16. Thanks.

## 2015-12-14 NOTE — Telephone Encounter (Signed)
She can wait to see endocrine new few to several weeks is fine

## 2015-12-14 NOTE — Telephone Encounter (Signed)
LMTCB x 1 

## 2015-12-14 NOTE — Telephone Encounter (Signed)
LMTCB

## 2015-12-14 NOTE — Telephone Encounter (Signed)
Pt returning call

## 2015-12-14 NOTE — Telephone Encounter (Signed)
lmtcb x2 for pt. 

## 2015-12-15 NOTE — Telephone Encounter (Signed)
Spoke with pt. She is aware of MR's response. Nothing further was needed. 

## 2015-12-21 ENCOUNTER — Inpatient Hospital Stay: Payer: Medicare Other | Admitting: Internal Medicine

## 2015-12-28 ENCOUNTER — Encounter: Payer: Self-pay | Admitting: Endocrinology

## 2016-01-02 LAB — FUNGUS CULTURE WITH STAIN

## 2016-01-02 LAB — FUNGAL ORGANISM REFLEX

## 2016-01-02 LAB — FUNGUS CULTURE RESULT

## 2016-01-03 ENCOUNTER — Ambulatory Visit (INDEPENDENT_AMBULATORY_CARE_PROVIDER_SITE_OTHER): Payer: Medicare Other | Admitting: Cardiology

## 2016-01-03 ENCOUNTER — Encounter: Payer: Self-pay | Admitting: Cardiology

## 2016-01-03 VITALS — BP 120/66 | HR 86 | Ht 64.5 in | Wt 181.0 lb

## 2016-01-03 DIAGNOSIS — Z720 Tobacco use: Secondary | ICD-10-CM

## 2016-01-03 DIAGNOSIS — I2584 Coronary atherosclerosis due to calcified coronary lesion: Secondary | ICD-10-CM | POA: Diagnosis not present

## 2016-01-03 DIAGNOSIS — I2510 Atherosclerotic heart disease of native coronary artery without angina pectoris: Secondary | ICD-10-CM | POA: Diagnosis not present

## 2016-01-03 DIAGNOSIS — Z87891 Personal history of nicotine dependence: Secondary | ICD-10-CM

## 2016-01-03 DIAGNOSIS — R0600 Dyspnea, unspecified: Secondary | ICD-10-CM | POA: Diagnosis not present

## 2016-01-03 NOTE — Patient Instructions (Signed)
Medication Instructions:  The current medical regimen is effective;  continue present plan and medications.  Follow-Up: Follow up as needed with Dr Skains.  Thank you for choosing [facility]!!     

## 2016-01-03 NOTE — Progress Notes (Signed)
Cardiology Office Note   Date:  01/03/2016   ID:  [name], DOB Oct 17, 1932, MRN [id number]  PCP:  Martha Heck, MD  Cardiologist:   Martha Furbish, MD       History of Present Illness: [name] is a 82 y.o. female [name]) here for the follow up of coronary artery disease/calcification. She is a former smoker, has COPD followed by Dr. Chase Bentley who ordered a CT scan to evaluate lung nodules which picked up coronary artery calcification.  Because of the calcification, we noted of doing a nuclear stress test that showed no evidence of ischemia. Reassuring. Her echocardiogram also showed normal ejection fraction.  She has shortness of breath with activity which she attributes to deconditioning. Her FEV1 was 75% of predicted. She tells me that she would not 1 any major surgeries performed (bypass for instance).  She does not describe any chest pain, no syncope, no palpitations.  She has been battling pseudomonas aeruginosa infection of left leg. She also has chronic lower extremity swelling, pedal edema discussed conservative measures.  Overall she's been doing well. No changes..     Past Medical History:  Diagnosis Date  . Acute bronchitis 05/23/2011  . Acute upper respiratory infections of unspecified site 05/23/2011  . Arthritis   . Chronic airway obstruction, not elsewhere classified 05/23/2011  . Disturbance of salivary secretion 01/31/2011  . Dizziness and giddiness 01/31/2011  . Essential tremor 04/25/2014  . External hemorrhoids without mention of complication [id number]  . Gait disorder 04/25/2014  . Insomnia, unspecified 09/12/2011  . Lumbago 01/31/2011  . Major depressive disorder, single episode, unspecified ([name]) 01/31/2011  . Memory disorder 04/25/2014  . Mitral valve disorders 01/31/2011  . Other and unspecified hyperlipidemia 01/31/2011  . Other convulsions 01/31/2011  . Other emphysema ([place]) 01/31/2011  . Pain in joint, site unspecified  01/31/2011  . Restless legs syndrome (RLS) 09/12/2011  . Retinal detachment with retinal defect of right eye 2011   right eye twice  . Senile osteoporosis 01/31/2011  . Spontaneous ecchymoses 01/31/2011  . Stiffness of joints, not elsewhere classified, multiple sites 01/31/2011  . Unspecified essential hypertension 01/31/2011    Past Surgical History:  Procedure Laterality Date  . ABDOMINAL HYSTERECTOMY  06/21/2003   TAH/BSO, omenectomy PSB resect, Stg IC cystadenofibroma  . CHOLECYSTECTOMY  2005   Dr. Marlou Starks  . ELBOW SURGERY Right 2008   broken   Dr. Apolonio Schneiders  . EYE SURGERY    . RETINAL DETACHMENT SURGERY N/A    two  . ROTATOR CUFF REPAIR Right 2012   Dr. Theda Sers  . SQUAMOUS CELL CARCINOMA EXCISION Bilateral 2012, 8/14   [place] on legs   Dr. Sarajane Jews  . TONSILLECTOMY  1941  . VIDEO BRONCHOSCOPY WITH ENDOBRONCHIAL NAVIGATION N/A 11/29/2015   Procedure: VIDEO BRONCHOSCOPY WITH ENDOBRONCHIAL NAVIGATION;  Surgeon: Collene Gobble, MD;  Location: [facility];  Service: Thoracic;  Laterality: N/A;     Current Outpatient Prescriptions  Medication Sig Dispense Refill  . albuterol (PROVENTIL HFA;VENTOLIN HFA) 108 (90 BASE) MCG/ACT inhaler Inhale 2 puffs into the lungs every 4 (four) hours as needed for wheezing or shortness of breath. 8 g 5  . alclomethasone (ACLOVATE) 0.05 % cream Apply 1 application topically 3 (three) times a week.     [name] alendronate (FOSAMAX) 70 MG tablet Take 70 mg by mouth every Monday.     [name] aspirin 81 MG tablet Take 81 mg by mouth daily.    . cholecalciferol (VITAMIN D) 1000 units  tablet Take 1 tablet (1,000 Units total) by mouth daily.    . hydrocortisone 2.5 % cream Apply 1 application topically as needed (for psoriasis).     [name] ketoconazole (NIZORAL) 2 % cream Apply 1 application topically as needed (for psoriasis).     [name] lamoTRIgine (LAMICTAL) 150 MG tablet TAKE 1 TABLET (150 mg) every morning and every evening.  Take 1/2 tablet (75 mg) at lunch 225 tablet 5  .  levETIRAcetam (KEPPRA) 500 MG tablet TAKE (1/2) TABLET (250 mg) THREE TIMES DAILY. 135 tablet 0  . memantine (NAMENDA) 10 MG tablet Take 1 tablet (10 mg total) by mouth 2 (two) times daily. 180 tablet 3  . pramipexole (MIRAPEX) 0.25 MG tablet Take 1 tablet (0.25 mg total) by mouth daily. 90 tablet 3  . simvastatin (ZOCOR) 10 MG tablet Take 10 mg by mouth daily. Reported on 06/27/2015    . tiotropium (SPIRIVA HANDIHALER) 18 MCG inhalation capsule INHALE CONTENTS OF ONE CAPSULE (18 mcg) ONCE DAILY FOR COPD. 30 capsule 5   No current facility-administered medications for this visit.     Allergies:   Sulfa antibiotics and Tetracyclines & related    Social History:  The patient  reports that she quit smoking about 27 years ago. Her smoking use included Cigarettes. She has a 40.00 pack-year smoking history. She has never used smokeless tobacco. She reports that she does not drink alcohol or use drugs.   Family History:  The patient's family history includes Cancer in her mother; Heart disease in her father; Seizures in her sister.    ROS:  Please see the history of present illness.   Otherwise, review of systems are positive for leg swelling, shortness of breath with activity, depression, back pain, easy bruising, balance issues, walking issues, excessive fatigue, constipation.   All other systems are reviewed and negative.    PHYSICAL EXAM: VS:  BP 120/66   Pulse 86   Ht 5' 4.5" (1.638 m)   Wt 181 lb (82.1 kg)   LMP 02/16/1975   BMI 30.59 kg/m  , BMI Body mass index is 30.59 kg/m. GEN: Elderly, uses a cane to ambulate, in no acute distress  HEENT: normal  Neck: no JVD, carotid bruits, or masses Cardiac: RRR; 1/6 systolic murmur, RUSB, no rubs, or gallops,no edema  Respiratory:  Diminished breath sounds/air movement especially left lung fields, kyphosis noted, hyperinflation GI: soft, nontender, nondistended, + BS MS: no deformity or atrophy  Skin: Minimal Pedal edema, redness of left  lateral lower extremity, former Pseudomonas infection. Treated by dermatology. Neuro:  Strength and sensation are intact Psych: euthymic mood, full affect   EKG:  Today: 01/03/16-sinus rhythm, 86, poor R-wave progression, left anterior fascicular block personally viewed-prior 01/03/15-sinus rhythm, 78, first-degree AV block borderline, PR interval 210 ms, left anterior fascicular block. No other significant abnormalities. Personally viewed.  CT scan 09/21/14: Chest. Personally viewed, showed to patient  Coronary calcification noted, mitral annular calcification as well.    Recent Labs: 11/27/2015: ALT 16; BUN 25; Creatinine, Ser 0.92; Hemoglobin 14.9; Platelets 195; Potassium 3.8; Sodium 139    Lipid Panel    Component Value Date/Time   CHOL 249 (H) 06/27/2015 1055   TRIG 97 06/27/2015 1055   HDL 70 06/27/2015 1055   CHOLHDL 3.6 06/27/2015 1055   VLDL 19 06/27/2015 1055   LDLCALC 160 (H) 06/27/2015 1055      Wt Readings from Last 3 Encounters:  01/03/16 181 lb (82.1 kg)  11/29/15 163 lb (73.9 kg)  11/27/15 163 lb 1 oz (74 kg)      Other studies Reviewed: Additional studies/ records that were reviewed today include: CT scan personally viewed, EKG, blood work, office visits. Review of the above records demonstrates: As above  ECHO 01/19/15: - Left ventricle: The cavity size was normal. There was moderate   concentric hypertrophy. Systolic function was vigorous. The   estimated ejection fraction was in the range of 65% to 70%. Wall   motion was normal; there were no regional wall motion   abnormalities. Doppler parameters are consistent with abnormal   left ventricular relaxation (grade 1 diastolic dysfunction).   Doppler parameters are consistent with elevated ventricular   end-diastolic filling pressure. - Aortic valve: Trileaflet; mildly thickened, mildly calcified   leaflets. Transvalvular velocity was within the normal range.   There was no stenosis. There was trivial  regurgitation. - Aortic root: The aortic root was normal in size. - Ascending aorta: The ascending aorta was normal in size. - Mitral valve: Calcified annulus. Mildly thickened leaflets . - Left atrium: The atrium was mildly dilated. - Right ventricle: The cavity size was normal. Wall thickness was   normal. Systolic function was normal. - Tricuspid valve: There was mild regurgitation. - Pulmonic valve: There was trivial regurgitation. - Pulmonary arteries: PA peak pressure: 35 mm Hg (S). - Inferior vena cava: The vessel was normal in size. The   respirophasic diameter changes were in the normal range (= 50%),   consistent with normal central venous pressure.   NUC stress 01/19/15;   Nuclear stress EF: 83%.   There was no ST segment deviation noted during stress.   No T wave inversion was noted during stress.   Defect 1: There is a small defect of moderate severity present in the mid anteroseptal and apical anterior location. Likely due to breast attenuation, but cannot rule out infarct. No reversible ischemia.   This is a low risk study.   The study is normal.   The left ventricular ejection fraction is hyperdynamic (>65%).  ASSESSMENT AND PLAN:  1.  Coronary artery disease/coronary artery calcification-noted on CT scan. Explained to her the physiology behind this. Pharmacologic stress test with no evidence of ischemia. Continue with statin therapy. Aspirin. Secondary prevention. She's not having any anginal symptoms.  2. Dyspnea-multifactorial, conditioning, COPD. Echocardiogram overall reassuring ejection fraction. It looks like she also has mitral annular calcification on CT scan. 1/6 systolic murmur appreciated.  3. COPD-per pulmonary, Dr. Chase Bentley. Medications reviewed. FEV1 is 75% of predicted.  4. Lower extremity edema-possible stasis. Compression hose may be helpful. She does not seem terribly excited about using these. Continue walking, elevation of legs. She  does not drink a huge amount of liquids.  Current medicines are reviewed at length with the patient today.  The patient does not have concerns regarding medicines.  The following changes have been made:  no change  Labs/ tests ordered today include:   No orders of the defined types were placed in this encounter.    Disposition:   FU as needed.  Signed, Martha Furbish, MD  01/03/2016 9:14 AM    [facility] [place], [place], [place]  [address] Phone: [phone]; Fax: [phone]

## 2016-01-04 NOTE — Addendum Note (Signed)
Addended by: Paulla Dolly on: 01/04/2016 08:27 AM   Modules accepted: Orders

## 2016-01-11 LAB — ACID FAST CULTURE WITH REFLEXED SENSITIVITIES

## 2016-01-11 LAB — ACID FAST CULTURE WITH REFLEXED SENSITIVITIES (MYCOBACTERIA): Acid Fast Culture: NEGATIVE

## 2016-01-15 ENCOUNTER — Encounter: Payer: Self-pay | Admitting: Internal Medicine

## 2016-01-15 ENCOUNTER — Ambulatory Visit (INDEPENDENT_AMBULATORY_CARE_PROVIDER_SITE_OTHER): Payer: Medicare Other | Admitting: Internal Medicine

## 2016-01-15 ENCOUNTER — Telehealth: Payer: Self-pay | Admitting: Internal Medicine

## 2016-01-15 VITALS — BP 118/78 | HR 87 | Ht 64.5 in | Wt 163.2 lb

## 2016-01-15 DIAGNOSIS — R918 Other nonspecific abnormal finding of lung field: Secondary | ICD-10-CM | POA: Diagnosis not present

## 2016-01-15 DIAGNOSIS — J431 Panlobular emphysema: Secondary | ICD-10-CM

## 2016-01-15 DIAGNOSIS — R946 Abnormal results of thyroid function studies: Secondary | ICD-10-CM

## 2016-01-15 IMAGING — NM NM MYOCAR MULTI W/ SPECT
6 series · 36 of 36 positions shown · non-contrast
Comparison: none

[Series 1: stress_(id)_sa · 6.5mm · 6.51mm/px · 6 of 512 frames shown (1 of 4)]
[frame 43/512]
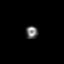
[frame 128/512]
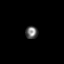
[frame 214/512]
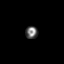
[frame 299/512]
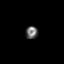
[frame 384/512]
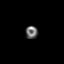
[frame 470/512]
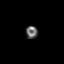

[Series 1: rest_(id)_sa · 6.5mm · 6.51mm/px · 6 of 64 frames shown (1 of 2)]
[frame 6/64]
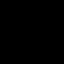
[frame 16/64]
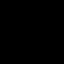
[frame 27/64]
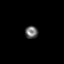
[frame 38/64]
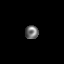
[frame 48/64]
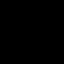
[frame 59/64]
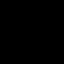

[Series 1: rest_(id)_sa · 6.5mm · 6.51mm/px · 6 of 64 frames shown (2 of 2)]
[frame 6/64]
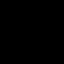
[frame 16/64]
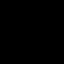
[frame 27/64]
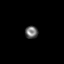
[frame 38/64]
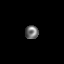
[frame 48/64]
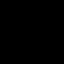
[frame 59/64]
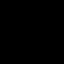

[Series 1: stress_(id)_sa · 6.5mm · 6.51mm/px · 6 of 512 frames shown (2 of 4)]
[frame 43/512]
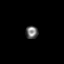
[frame 128/512]
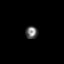
[frame 214/512]
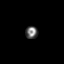
[frame 299/512]
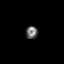
[frame 384/512]
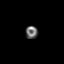
[frame 470/512]
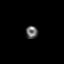

[Series 1: stress_(id)_sa · 6.5mm · 6.51mm/px · 6 of 64 frames shown (3 of 4)]
[frame 6/64]
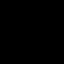
[frame 16/64]
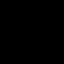
[frame 27/64]
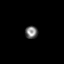
[frame 38/64]
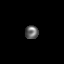
[frame 48/64]
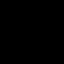
[frame 59/64]
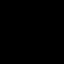

[Series 1: stress_(id)_sa · 6.5mm · 6.51mm/px · 6 of 64 frames shown (4 of 4)]
[frame 6/64]
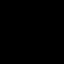
[frame 16/64]
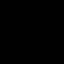
[frame 27/64]
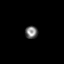
[frame 38/64]
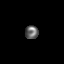
[frame 48/64]
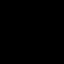
[frame 59/64]
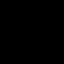

[36 of 36 positions shown; findings below may reference images not displayed]

Canned report from images found in remote index.

Refer to host system for actual result text.

## 2016-01-15 NOTE — Patient Instructions (Addendum)
ICD-9-CM ICD-10-CM   1. Multiple lung nodules on CT 793.19 R91.8   2. Panlobular emphysema ([place]) 492.8 J43.1    #Multiple lung nodules on CT - This is significantly improved as of December 2016 but  But larger Left lower lobe nodule July 2017  -non-diagnostic bronch ENB August 2017  - Cancer probability is lower but still not ruled out - repeat CT chest wo contrast in 6 months from 01/15/2016    -  #Emphysema with out of proportion shortness of breath - Glad this is stable and inhalers are helping -Continue Spiriva scheduled as before - As discussed it is okay for now per take Brio as needed - Glad uptodate with flu shot  #Follow-up - 6 months routine follow-up after  CT chest  PS: I wi l write to endocrinologist about the thyroid biopsy

## 2016-01-15 NOTE — Progress Notes (Signed)
Subjective:    Patient ID: [name], female    DOB: 09-12-1932, 82 y.o.   MRN: [id number]  HPI   OV 10/30/2015  Chief Complaint  Patient presents with  . Follow-up    Pt c/o occasional dry cough. Pt denies SOB/wheeze/CP/tightness. Pt states that as long as she uses her inhalers she does very well.    82 year old female follow-up for multiple issues  #Emphysema isolated low DLCO and CT evidence without out of proportion shortness of breath in setting of normal cardiac stress test and echocardiogram: At last visit 6 months ago she felt that pulmonary rehabilitation would not be that useful and would prefer to do it as a physical therapy in her friend's home. She did not do the physical therapy up until 3 weeks ago when she is doing it more for balance issues. Nevertheless she states dyspnea stable. I discovered today that she is only taking Spiriva scheduled. She is only taking the Brio as needed which she is taking 3 times a month. She does not want to do albuterol for rescue but instead prefers the [facility] for needing to "feel safe"  #Multiple lung nodules: Last CT chest without contrast was December 2016. Nodules were stable one of them had resolved and one of them had diminished. Recommendation was to do CT chest December 2017. But in July 2017. Abdominal CT and this shows an enlarging left lower lobe pulmonary nodule subpleural groundglass opacity. This was compared to March 2016. She is concerned about this. She does not have hemoptysis or weight loss. I personally visualized the CT chest and agree    OV 11/10/2015  Chief Complaint  Patient presents with  . Follow-up    Pt here after PET scan. Pt denies breathing changes since last OV. Pt denies any new complaints at this time.    Presents with all her sisters to discuss results of Ct chest abnd PET scan  # LUNG NODULE CT chest 11/03/15: personally visualised and PET scan 11/08/15 1. LUL nodule is 1.3cm  - very slowly enlarging  over time - LOW uptake 2. LLL nodule - 1.8cm - INTERMEDIATE UPDTAKE  Given age, smoking hx and size and slow grwoth - likely we are dealing with BAC  #New issue  - PET shows high uptake in right side of thyroid   OV 01/15/2016  Chief Complaint  Patient presents with  . Hospitalization Follow-up    Per Dr. Lamonte Sakai, Pt. states she had a bronchoscopy, Pt. states her breathing is doing ok, Pt c/o of sob, some wheezing, Pt. denies chest pain and chest tightness     FU ENB results and thyroiud bx results  She is her by herself. Middle Sister Inez Catalina not here. Derryl Harbor not here. Bx by DR Bryum non-diagnostic. PErcepta research registry test is indeterminate. Given she is 82 she wants very conservative Rx  Thyroid bx: folloicular atypica. She says on internet it says 5% chance of cancer. SHe does not want thyroidectomy and questions need for endocrine referrral.   OVerall well. Dyspnea stable. Saw Dr Marlou Porch cardiac stress normal. Is uptodate with high dose flu shot      has a past medical history of Acute bronchitis (05/23/2011); Acute upper respiratory infections of unspecified site (05/23/2011); Arthritis; Chronic airway obstruction, not elsewhere classified ([place]) (05/23/2011); Disturbance of salivary secretion (01/31/2011); Dizziness and giddiness (01/31/2011); Essential tremor (04/25/2014); External hemorrhoids without mention of complication (A999333); Gait disorder (04/25/2014); Insomnia, unspecified (09/12/2011); Lumbago (01/31/2011); Major depressive disorder, single  episode, unspecified (01/31/2011); Memory disorder (04/25/2014); Mitral valve disorders (01/31/2011); Other and unspecified hyperlipidemia (01/31/2011); Other convulsions ([place]) (01/31/2011); Other emphysema ([place]) (01/31/2011); Pain in joint, site unspecified (01/31/2011); Restless legs syndrome (RLS) (09/12/2011); Retinal detachment with retinal defect of right eye (2011); Senile osteoporosis (01/31/2011); Spontaneous ecchymoses  (01/31/2011); Stiffness of joints, not elsewhere classified, multiple sites (01/31/2011); and Unspecified essential hypertension (01/31/2011).   reports that she quit smoking about 27 years ago. Her smoking use included Cigarettes. She has a 40.00 pack-year smoking history. She has never used smokeless tobacco.  Past Surgical History:  Procedure Laterality Date  . ABDOMINAL HYSTERECTOMY  06/21/2003   TAH/BSO, omenectomy PSB resect, Stg IC cystadenofibroma  . CHOLECYSTECTOMY  2005   Dr. Marlou Starks  . ELBOW SURGERY Right 2008   broken   Dr. Apolonio Schneiders  . EYE SURGERY    . RETINAL DETACHMENT SURGERY N/A    two  . ROTATOR CUFF REPAIR Right 2012   Dr. Theda Sers  . SQUAMOUS CELL CARCINOMA EXCISION Bilateral 2012, 8/14   [place] on legs   Dr. Sarajane Jews  . TONSILLECTOMY  1941  . VIDEO BRONCHOSCOPY WITH ENDOBRONCHIAL NAVIGATION N/A 11/29/2015   Procedure: VIDEO BRONCHOSCOPY WITH ENDOBRONCHIAL NAVIGATION;  Surgeon: Collene Gobble, MD;  Location: [facility] OR;  Service: Thoracic;  Laterality: N/A;    Allergies  Allergen Reactions  . Sulfa Antibiotics Nausea And Vomiting  . Tetracyclines & Related     Immunization History  Administered Date(s) Administered  . Influenza Split 01/06/2014  . Influenza Whole 01/07/2012, 01/06/2013  . Influenza,inj,Quad PF,36+ Mos 12/21/2014  . Pneumococcal Conjugate-13 02/01/2014  . Pneumococcal Polysaccharide-23 04/08/2004  . Td 04/08/2002  . Tdap 04/09/2011  . Zoster 04/08/2008    Family History  Problem Relation Age of Onset  . Heart disease Father     CHF  . Cancer Mother     breast  . Seizures Sister      Current Outpatient Prescriptions:  .  albuterol (PROVENTIL HFA;VENTOLIN HFA) 108 (90 BASE) MCG/ACT inhaler, Inhale 2 puffs into the lungs every 4 (four) hours as needed for wheezing or shortness of breath., Disp: 8 g, Rfl: 5 .  alclomethasone (ACLOVATE) 0.05 % cream, Apply 1 application topically 3 (three) times a week. , Disp: , Rfl:  .  alendronate (FOSAMAX) 70  MG tablet, Take 70 mg by mouth every Monday. , Disp: , Rfl:  .  aspirin 81 MG tablet, Take 81 mg by mouth daily., Disp: , Rfl:  .  cholecalciferol (VITAMIN D) 1000 units tablet, Take 1 tablet (1,000 Units total) by mouth daily., Disp: , Rfl:  .  hydrocortisone 2.5 % cream, Apply 1 application topically as needed (for psoriasis). , Disp: , Rfl:  .  ketoconazole (NIZORAL) 2 % cream, Apply 1 application topically as needed (for psoriasis). , Disp: , Rfl:  .  lamoTRIgine (LAMICTAL) 150 MG tablet, TAKE 1 TABLET (150 mg) every morning and every evening.  Take 1/2 tablet (75 mg) at lunch, Disp: 225 tablet, Rfl: 5 .  levETIRAcetam (KEPPRA) 500 MG tablet, TAKE (1/2) TABLET (250 mg) THREE TIMES DAILY., Disp: 135 tablet, Rfl: 0 .  memantine (NAMENDA) 10 MG tablet, Take 1 tablet (10 mg total) by mouth 2 (two) times daily., Disp: 180 tablet, Rfl: 3 .  pramipexole (MIRAPEX) 0.25 MG tablet, Take 1 tablet (0.25 mg total) by mouth daily., Disp: 90 tablet, Rfl: 3 .  simvastatin (ZOCOR) 10 MG tablet, Take 10 mg by mouth daily. Reported on 06/27/2015, Disp: , Rfl:  .  tiotropium (SPIRIVA HANDIHALER) 18 MCG inhalation capsule, INHALE CONTENTS OF ONE CAPSULE (18 mcg) ONCE DAILY FOR COPD., Disp: 30 capsule, Rfl: 5   Review of Systems  Constitutional: Negative.  Negative for fever and unexpected weight change.  HENT: Negative.  Negative for congestion, dental problem, ear pain, nosebleeds, postnasal drip, rhinorrhea, sinus pressure, sneezing, sore throat and trouble swallowing.   Eyes: Negative.  Negative for redness and itching.  Respiratory: Positive for shortness of breath. Negative for cough, chest tightness and wheezing.   Cardiovascular: Negative.  Negative for palpitations and leg swelling.  Gastrointestinal: Negative.  Negative for nausea and vomiting.  Endocrine: Negative.   Genitourinary: Negative.  Negative for dysuria.  Musculoskeletal: Positive for joint swelling.  Skin: Negative.  Negative for rash.    Neurological: Negative.  Negative for headaches.  Hematological: Bruises/bleeds easily.  Psychiatric/Behavioral: Negative.  Negative for dysphoric mood. The patient is not nervous/anxious.        Objective:   Physical Exam  Constitutional: She is oriented to person, place, and time. She appears well-developed and well-nourished. No distress.  HENT:  Head: Normocephalic and atraumatic.  Right Ear: External ear normal.  Left Ear: External ear normal.  Mouth/Throat: Oropharynx is clear and moist. No oropharyngeal exudate.  Eyes: Conjunctivae and EOM are normal. Pupils are equal, round, and reactive to light. Right eye exhibits no discharge. Left eye exhibits no discharge. No scleral icterus.  Neck: Normal range of motion. Neck supple. No JVD present. No tracheal deviation present. No thyromegaly present.  Cardiovascular: Normal rate, regular rhythm, normal heart sounds and intact distal pulses.  Exam reveals no gallop and no friction rub.   No murmur heard. Pulmonary/Chest: Effort normal and breath sounds normal. No respiratory distress. She has no wheezes. She has no rales. She exhibits no tenderness.  Abdominal: Soft. Bowel sounds are normal. She exhibits no distension and no mass. There is no tenderness. There is no rebound and no guarding.  Musculoskeletal: Normal range of motion. She exhibits no edema or tenderness.  Walks with cane  Lymphadenopathy:    She has no cervical adenopathy.  Neurological: She is alert and oriented to person, place, and time. She has normal reflexes. No cranial nerve deficit. She exhibits normal muscle tone. Coordination normal.  Skin: Skin is warm and dry. No rash noted. She is not diaphoretic. No erythema. No pallor.  Psychiatric: She has a normal mood and affect. Her behavior is normal. Judgment and thought content normal.  Vitals reviewed.   Vitals:   01/15/16 1058  BP: 118/78  Pulse: 87  SpO2: 94%  Weight: 163 lb 3.2 oz (74 kg)  Height: 5' 4.5"  (1.638 m)         Assessment & Plan:     ICD-9-CM ICD-10-CM   1. Multiple lung nodules on CT 793.19 R91.8   2. Panlobular emphysema ([place]) 492.8 J43.1     #Multiple lung nodules on CT - This is significantly improved as of December 2016 but  But larger Left lower lobe nodule July 2017  -non-diagnostic bronch ENB August 2017  - Cancer probability is lower but still not ruled out - repeat CT chest wo contrast in 6 months from 01/15/2016    -  #Emphysema with out of proportion shortness of breath - Glad this is stable and inhalers are helping -Continue Spiriva scheduled as before - As discussed it is okay for now per take Brio as needed - Glad uptodate with flu shot  #Follow-up - 6 months routine  follow-up after  CT chest  PS: I wi l write to endocrinologist about the thyroid biopsy  Dr. Brand Males, M.D., [facility].C.P Pulmonary and Critical Care Medicine Staff Physician [place] [facility] Pager: [phone], If no answer or between  15:00h - 7:00h: call [phone]  01/15/2016 11:21 AM

## 2016-01-15 NOTE — Telephone Encounter (Signed)
Hi Martha  ASHIKA Bentley  is a 82 y.o. patient of mine. She had thyrid bx that showed follicular atypia. Is this something for which she needs to see you?  Thanks  Dr. Brand Males, M.D., [facility].C.P Pulmonary and Critical Care Medicine Staff Physician [place] [facility] Pager: [phone], If no answer or between  15:00h - 7:00h: call [phone]  01/15/2016 11:26 AM

## 2016-01-16 NOTE — Telephone Encounter (Signed)
Hi Murali, Yes, I will need to see her. Thank you, Salena Saner

## 2016-01-16 NOTE — Telephone Encounter (Signed)
Called and spoke to pt.Infomred her of the recs per MR. Order placed. Pt verbalized understanding and denied any further questions or concerns at this time.

## 2016-01-16 NOTE — Telephone Encounter (Signed)
Hi Elise (cc to Dr Wardell Heath)  Please let AAMYAH ASSI know that I d/w endocrine and consensus is that patient be seen for consultation and discussion. Please refer to Dr Philemon Kingdom  Thanks  Dr. Brand Males, M.D., [facility].C.P Pulmonary and Critical Care Medicine Staff Physician [place] [facility] Pager: [phone], If no answer or between  15:00h - 7:00h: call [phone]  01/16/2016 12:00 PM

## 2016-01-18 NOTE — Addendum Note (Signed)
Addended by: Collier Salina on: 01/18/2016 03:30 PM   Modules accepted: Orders

## 2016-01-26 ENCOUNTER — Ambulatory Visit (INDEPENDENT_AMBULATORY_CARE_PROVIDER_SITE_OTHER): Payer: Medicare Other | Admitting: Neurology

## 2016-01-26 ENCOUNTER — Encounter: Payer: Self-pay | Admitting: Neurology

## 2016-01-26 VITALS — BP 151/89 | HR 81 | Ht 64.5 in | Wt 160.0 lb

## 2016-01-26 DIAGNOSIS — G40909 Epilepsy, unspecified, not intractable, without status epilepticus: Secondary | ICD-10-CM

## 2016-01-26 DIAGNOSIS — R413 Other amnesia: Secondary | ICD-10-CM | POA: Diagnosis not present

## 2016-01-26 DIAGNOSIS — G2581 Restless legs syndrome: Secondary | ICD-10-CM | POA: Diagnosis not present

## 2016-01-26 DIAGNOSIS — R269 Unspecified abnormalities of gait and mobility: Secondary | ICD-10-CM | POA: Diagnosis not present

## 2016-01-26 NOTE — Progress Notes (Signed)
Reason for visit: Memory disorder  [name] is an 82 y.o. female  History of present illness:  [name] is an 82 year old right-handed white female with a history of seizures that have been well controlled on Keppra and Lamictal. The patient has not had any seizures since 2011. The patient has a mild memory disorder, she is currently on Namenda and she is tolerating the medication well. She does have restless leg syndrome that is well controlled on low-dose Mirapex. She has a mild gait disorder, she uses a cane for ambulation, she denies any recent falls. The patient returns to the office today for an evaluation.   Past Medical History:  Diagnosis Date  . Acute bronchitis 05/23/2011  . Acute upper respiratory infections of unspecified site 05/23/2011  . Arthritis   . Chronic airway obstruction, not elsewhere classified 05/23/2011  . Disturbance of salivary secretion 01/31/2011  . Dizziness and giddiness 01/31/2011  . Essential tremor 04/25/2014  . External hemorrhoids without mention of complication [id number]  . Gait disorder 04/25/2014  . Insomnia, unspecified 09/12/2011  . Lumbago 01/31/2011  . Major depressive disorder, single episode, unspecified 01/31/2011  . Memory disorder 04/25/2014  . Mitral valve disorders(424.0) 01/31/2011  . Other and unspecified hyperlipidemia 01/31/2011  . Other convulsions 01/31/2011  . Other emphysema ([place]) 01/31/2011  . Pain in joint, site unspecified 01/31/2011  . Restless legs syndrome (RLS) 09/12/2011  . Retinal detachment with retinal defect of right eye 2011   right eye twice  . Senile osteoporosis 01/31/2011  . Spontaneous ecchymoses 01/31/2011  . Stiffness of joints, not elsewhere classified, multiple sites 01/31/2011  . Unspecified essential hypertension 01/31/2011    Past Surgical History:  Procedure Laterality Date  . ABDOMINAL HYSTERECTOMY  06/21/2003   TAH/BSO, omenectomy PSB resect, Stg IC cystadenofibroma  . CHOLECYSTECTOMY   2005   Dr. Marlou Starks  . ELBOW SURGERY Right 2008   broken   Dr. Apolonio Schneiders  . EYE SURGERY    . RETINAL DETACHMENT SURGERY N/A    two  . ROTATOR CUFF REPAIR Right 2012   Dr. Theda Sers  . SQUAMOUS CELL CARCINOMA EXCISION Bilateral 2012, 8/14   [place] on legs   Dr. Sarajane Jews  . TONSILLECTOMY  1941  . VIDEO BRONCHOSCOPY WITH ENDOBRONCHIAL NAVIGATION N/A 11/29/2015   Procedure: VIDEO BRONCHOSCOPY WITH ENDOBRONCHIAL NAVIGATION;  Surgeon: Collene Gobble, MD;  Location: [facility] OR;  Service: Thoracic;  Laterality: N/A;    Family History  Problem Relation Age of Onset  . Heart disease Father     CHF  . Cancer Mother     breast  . Seizures Sister     Social history:  reports that she quit smoking about 27 years ago. Her smoking use included Cigarettes. She has a 40.00 pack-year smoking history. She has never used smokeless tobacco. She reports that she does not drink alcohol or use drugs.    Allergies  Allergen Reactions  . Sulfa Antibiotics Nausea And Vomiting  . Tetracyclines & Related     Medications:  Prior to Admission medications   Medication Sig Start Date End Date Taking? Authorizing Provider  albuterol (PROVENTIL HFA;VENTOLIN HFA) 108 (90 BASE) MCG/ACT inhaler Inhale 2 puffs into the lungs every 4 (four) hours as needed for wheezing or shortness of breath. 09/27/14  Yes Tammy S Parrett, NP  alclomethasone (ACLOVATE) 0.05 % cream Apply 1 application topically 3 (three) times a week.  09/10/13  Yes Historical Provider, MD  alendronate (FOSAMAX) 70 MG tablet Take 70  mg by mouth every Monday.  10/14/15  Yes Historical Provider, MD  aspirin 81 MG tablet Take 81 mg by mouth daily.   Yes Historical Provider, MD  cholecalciferol (VITAMIN D) 1000 units tablet Take 1 tablet (1,000 Units total) by mouth daily. 11/29/15  Yes Collene Gobble, MD  hydrocortisone 2.5 % cream Apply 1 application topically as needed (for psoriasis).  09/20/15  Yes Historical Provider, MD  ketoconazole (NIZORAL) 2 % cream Apply 1  application topically as needed (for psoriasis).  09/20/15  Yes Historical Provider, MD  lamoTRIgine (LAMICTAL) 150 MG tablet TAKE 1 TABLET (150 mg) every morning and every evening.  Take 1/2 tablet (75 mg) at lunch 11/29/15  Yes Collene Gobble, MD  levETIRAcetam (KEPPRA) 500 MG tablet TAKE (1/2) TABLET (250 mg) THREE TIMES DAILY. 11/29/15  Yes Collene Gobble, MD  memantine (NAMENDA) 10 MG tablet Take 1 tablet (10 mg total) by mouth 2 (two) times daily. 04/28/15  Yes [name], MD  pramipexole (MIRAPEX) 0.25 MG tablet Take 1 tablet (0.25 mg total) by mouth daily. 04/28/15  Yes [name], MD  simvastatin (ZOCOR) 10 MG tablet Take 10 mg by mouth daily. Reported on 06/27/2015   Yes Historical Provider, MD  tiotropium (SPIRIVA HANDIHALER) 18 MCG inhalation capsule INHALE CONTENTS OF ONE CAPSULE (18 mcg) ONCE DAILY FOR COPD. 11/29/15  Yes Collene Gobble, MD    ROS:  Out of a complete 14 system review of symptoms, the patient complains only of the following symptoms, and all other reviewed systems are negative.  Decreased activity, decreased appetite Shortness of breath Leg swelling Excessive eating Constipation Restless legs Joint pain, walking difficulty, neck stiffness Memory loss, numbness, seizures Depression, anxiety  Blood pressure (!) 151/89, pulse 81, height 5' 4.5" (1.638 m), weight 160 lb (72.6 kg), last menstrual period 02/16/1975.  Physical Exam  General: The patient is alert and cooperative at the time of the examination.  Skin: No significant peripheral edema is noted.   Neurologic Exam  Mental status: The patient is alert and oriented x 3 at the time of the examination. The patient has apparent normal recent and remote memory, with an apparently normal attention span and concentration ability. Mini-Mental Status Examination done today shows a total score of 30/30.   Cranial nerves: Facial symmetry is present. Speech is normal, no aphasia or dysarthria is noted.  Extraocular movements are full. Visual fields are full.  Motor: The patient has good strength in all 4 extremities.  Sensory examination: Soft touch sensation is symmetric on the face, arms, and legs.  Coordination: The patient has good finger-nose-finger and heel-to-shin bilaterally.  Gait and station: The patient has a normal gait. The patient does have a cane for ambulation. Tandem gait is slightly unsteady. Romberg is negative. No drift is seen.  Reflexes: Deep tendon reflexes are symmetric.   Assessment/Plan:  1. History of seizures, well controlled  2. Mild memory disturbance  3. Mild gait disorder  4. Restless leg syndrome  The patient will continue her Keppra, Lamictal, Mirapex, and Namenda. She will follow-up in one year, sooner if needed.   Jill Alexanders MD 01/26/2016 10:32 AM  [name] [address] [place], [place] [address]  Phone [phone] Fax [phone]

## 2016-01-29 ENCOUNTER — Other Ambulatory Visit: Payer: Self-pay | Admitting: Emergency Medicine

## 2016-02-01 ENCOUNTER — Other Ambulatory Visit: Payer: Self-pay | Admitting: Emergency Medicine

## 2016-02-02 ENCOUNTER — Other Ambulatory Visit: Payer: Self-pay | Admitting: Neurology

## 2016-02-02 ENCOUNTER — Other Ambulatory Visit: Payer: Self-pay | Admitting: Emergency Medicine

## 2016-02-28 ENCOUNTER — Ambulatory Visit (INDEPENDENT_AMBULATORY_CARE_PROVIDER_SITE_OTHER): Payer: Medicare Other | Admitting: Internal Medicine

## 2016-02-28 ENCOUNTER — Encounter: Payer: Self-pay | Admitting: Internal Medicine

## 2016-02-28 DIAGNOSIS — E041 Nontoxic single thyroid nodule: Secondary | ICD-10-CM | POA: Diagnosis not present

## 2016-02-28 LAB — T4, FREE: Free T4: 0.86 ng/dL (ref 0.60–1.60)

## 2016-02-28 LAB — TSH: TSH: 1.51 u[IU]/mL (ref 0.35–4.50)

## 2016-02-28 LAB — T3, FREE: T3 FREE: 3.3 pg/mL (ref 2.3–4.2)

## 2016-02-28 NOTE — Patient Instructions (Signed)
Please stop at the lab.  Return to see me as needed.   Thyroid Nodule A thyroid nodule is an isolatedgrowth of thyroid cells that forms a lump in your thyroid gland. The thyroid gland is a butterfly-shaped gland. It is found in the lower front of your neck. This gland sends chemical messengers (hormones) through your blood to all parts of your body. These hormones are important in regulating your body temperature and helping your body to use energy. Thyroid nodules are common. Most are not cancerous (are benign). You may have one nodule or several nodules. Different types of thyroid nodules include:  Nodules that grow and fill with fluid (thyroid cysts).  Nodules that produce too much thyroid hormone (hot nodules or hyperthyroid).  Nodules that produce no thyroid hormone (cold nodules or hypothyroid).  Nodules that form from cancer cells (thyroid cancers). What are the causes? Usually, the cause of this condition is not known. What increases the risk? Factors that make this condition more likely to develop include:  Increasing age. Thyroid nodules become more common in people who are older than 80 years of age.  Gender.  Benign thyroid nodules are more common in women.  Cancerous (malignant) thyroid nodules are more common in men.  A family history that includes:  Thyroid nodules.  Pheochromocytoma.  Thyroid carcinoma.  Hyperparathyroidism.  Certain kinds of thyroid diseases, such as Hashimoto thyroiditis.  Lack of iodine.  A history of head and neck radiation, such as from X-rays. What are the signs or symptoms? It is common for this condition to cause no symptoms. If you have symptoms, they may include:  A lump in your lower neck.  Feeling a lump or tickle in your throat.  Pain in your neck, jaw, or ear.  Having trouble swallowing. Hot nodules may cause symptoms that include:  Weight loss.  Warm, flushed skin.  Feeling hot.  Feeling nervous.  A  racing heartbeat. Cold nodules may cause symptoms that include:  Weight gain.  Dry skin.  Brittle hair. This may also occur with hair loss.  Feeling cold.  Fatigue. Thyroid cancer nodules may cause symptoms that include:  Hard nodules that feel stuck to the thyroid gland.  Hoarseness.  Lumps in the glands near your thyroid (lymph nodes). How is this diagnosed? A thyroid nodule may be felt by your health care provider during a physical exam. This condition may also be diagnosed based on your symptoms. You may also have tests, including:  An ultrasound. This may be done to confirm the diagnosis.  A biopsy. This involves taking a sample from the nodule and looking at it under a microscope to see if the nodule is benign.  Blood tests to make sure that your thyroid is working properly.  Imaging tests such as MRI or CT scan may be done if:  Your nodule is large.  Your nodule is blocking your airway.  Cancer is suspected. How is this treated? Treatment depends on the cause and size of your nodule or nodules. If the nodule is benign, treatment may not be necessary. Your health care provider may monitor the nodule to see if it goes away without treatment. If the nodule continues to grow, is cancerous, or does not go away:  It may need to be drained with a needle.  It may need to be removed with surgery. If you have surgery, part or all of your thyroid gland may need to be removed as well. Follow these instructions at home:  Pay  attention to any changes in your nodule.  Take over-the-counter and prescription medicines only as told by your health care provider.  Keep all follow-up visits as told by your health care provider. This is important. Contact a health care provider if:  Your voice changes.  You have trouble swallowing.  You have pain in your neck, ear, or jaw that is getting worse.  Your nodule gets bigger.  Your nodule starts to make it harder for you to  breathe. Get help right away if:  You have a sudden fever.  You feel very weak.  Your muscles look like they are shrinking (muscle wasting).  You have mood swings.  You feel very restless.  You feel confused.  You are seeing or hearing things that other people do not see or hear (having hallucinations).  You feel suddenly nauseous or throw up.  You suddenly have diarrhea.  You have chest pain.  There is a loss of consciousness. This information is not intended to replace advice given to you by your health care provider. Make sure you discuss any questions you have with your health care provider. Document Released: 02/16/2004 Document Revised: 11/26/2015 Document Reviewed: 07/06/2014 Elsevier Interactive Patient Education  2017 [employer].

## 2016-02-28 NOTE — Progress Notes (Signed)
Patient ID: [name], female   DOB: [birth year], 82 y.o.   MRN: [id number]    HPI  [name] is a 82 y.o.-year-old female, referred by her PCP, Dr Chase Caller, for evaluation for a R thyroid nodule.  Patient tells me that she has a history of goiter in the past, for which she has seen an endocrinologist, who diagnosed her with euthyroid Hashimoto's thyroiditis.  More recently, she had a PET scan (11/08/2015) - investigation for lung nodules >> showing a R increased FDG uptake  Thyroid U/S (11/16/2015):  R:  Solid heterogeneous isoechoic nodule in the medial inferior gland measures 2.3 x 1.5 x 1.9 cm.  L: Isoechoic solid nodule with a peripheral hypoechoic halo in the mid to lower gland measures 0.7 x 0.6 x 0.7 cm.   A subtle isoechoic solid nodule in the medial lower gland measures 1.8 x 1.2 x 1.2 cm. No internal echogenic foci.  FNA of R nodule (12/04/2015): FLUS/AUS  Pt denies: - feeling nodules in neck - hoarseness - dysphagia - choking - SOB with lying down  I reviewed pt's thyroid tests: Lab Results  Component Value Date   TSH 1.860 04/25/2014    She has a previous dx of Hashimoto's thyroiditis >> euthyroid.   Pt c/o: - fatigue - heat intolerance/cold intolerance - tremors - palpitations - anxiety/depression - hyperdefecation/constipation - +weight gain - + constipation - dry skin - hair loss  No FH of thyroid ds. No FH of thyroid cancer. No h/o radiation tx to head or neck.  No seaweed or kelp. No recent contrast studies. No steroid use. No herbal supplements. No Biotin supplements or Hair, Skin and Nails vitamins.  ROS: Constitutional: + see HPI Eyes: no blurry vision, no xerophthalmia ENT: no sore throat, no nodules palpated in throat, no dysphagia/odynophagia, no hoarseness Cardiovascular: no CP/+ SOB/no palpitations/+ leg swelling Respiratory: no cough/+ SOB Gastrointestinal: no N/V/D/C Musculoskeletal: + both: muscle/joint aches Skin: no  rashes, + easy bruising, + hair loss Neurological: no tremors/numbness/tingling/dizziness Psychiatric: + both: depression/anxiety  Past Medical History:  Diagnosis Date  . Acute bronchitis 05/23/2011  . Acute upper respiratory infections of unspecified site 05/23/2011  . Arthritis   . Chronic airway obstruction, not elsewhere classified 05/23/2011  . Disturbance of salivary secretion 01/31/2011  . Dizziness and giddiness 01/31/2011  . Essential tremor 04/25/2014  . External hemorrhoids without mention of complication [id number]  . Gait disorder 04/25/2014  . Insomnia, unspecified 09/12/2011  . Lumbago 01/31/2011  . Major depressive disorder, single episode, unspecified 01/31/2011  . Memory disorder 04/25/2014  . Mitral valve disorders(424.0) 01/31/2011  . Other and unspecified hyperlipidemia 01/31/2011  . Other convulsions 01/31/2011  . Other emphysema ([place]) 01/31/2011  . Pain in joint, site unspecified 01/31/2011  . Restless legs syndrome (RLS) 09/12/2011  . Retinal detachment with retinal defect of right eye 2011   right eye twice  . Senile osteoporosis 01/31/2011  . Spontaneous ecchymoses 01/31/2011  . Stiffness of joints, not elsewhere classified, multiple sites 01/31/2011  . Unspecified essential hypertension 01/31/2011   Past Surgical History:  Procedure Laterality Date  . ABDOMINAL HYSTERECTOMY  06/21/2003   TAH/BSO, omenectomy PSB resect, Stg IC cystadenofibroma  . CHOLECYSTECTOMY  2005   Dr. Marlou Starks  . ELBOW SURGERY Right 2008   broken   Dr. Apolonio Schneiders  . EYE SURGERY    . RETINAL DETACHMENT SURGERY N/A    two  . ROTATOR CUFF REPAIR Right 2012   Dr. Theda Sers  . SQUAMOUS CELL CARCINOMA  EXCISION Bilateral 2012, 8/14   [place] on legs   Dr. Sarajane Jews  . TONSILLECTOMY  1941  . VIDEO BRONCHOSCOPY WITH ENDOBRONCHIAL NAVIGATION N/A 11/29/2015   Procedure: VIDEO BRONCHOSCOPY WITH ENDOBRONCHIAL NAVIGATION;  Surgeon: Collene Gobble, MD;  Location: [facility];  Service: Thoracic;  Laterality: N/A;    Social History   Social History  . Marital status: Divorced    Spouse name: N/A  . Number of children: 0   Occupational History  . retired Tourist information centre manager for [employer]    Social History Main Topics  . Smoking status: Former Smoker - quit 1990    Packs/day: 1.00    Years: 40.00    Types: Cigarettes    Quit date: 04/08/1988  . Smokeless tobacco: Never Used     Comment: quit in 1990  . Alcohol use No  . Drug use: No   Social History Narrative   Lives at [facility]   No children   Divorced   Exercise climbs steps 4 flights daily PT for [place]   Alcohol none   Stopped smoking 1989   Patient drinks 3 large sodas daily.   Patient is right handed.   Current Outpatient Prescriptions on File Prior to Visit  Medication Sig Dispense Refill  . albuterol (PROVENTIL HFA;VENTOLIN HFA) 108 (90 BASE) MCG/ACT inhaler Inhale 2 puffs into the lungs every 4 (four) hours as needed for wheezing or shortness of breath. 8 g 5  . alclomethasone (ACLOVATE) 0.05 % cream Apply 1 application topically 3 (three) times a week.     [name] alendronate (FOSAMAX) 70 MG tablet Take 70 mg by mouth every Monday.     [name] aspirin 81 MG tablet Take 81 mg by mouth daily.    . cholecalciferol (VITAMIN D) 1000 units tablet Take 1 tablet (1,000 Units total) by mouth daily.    . hydrocortisone 2.5 % cream Apply 1 application topically as needed (for psoriasis).     [name] ketoconazole (NIZORAL) 2 % cream Apply 1 application topically as needed (for psoriasis).     [name] lamoTRIgine (LAMICTAL) 150 MG tablet TAKE 1 TABLET (150 mg) every morning and every evening.  Take 1/2 tablet (75 mg) at lunch 225 tablet 5  . levETIRAcetam (KEPPRA) 500 MG tablet TAKE (1/2) TABLET THREE TIMES DAILY. 135 tablet 3  . memantine (NAMENDA) 10 MG tablet Take 1 tablet (10 mg total) by mouth 2 (two) times daily. 180 tablet 3  . pramipexole (MIRAPEX) 0.25 MG tablet Take 1 tablet (0.25 mg total) by mouth daily. 90 tablet 3  . simvastatin (ZOCOR)  10 MG tablet Take 10 mg by mouth daily. Reported on 06/27/2015    . tiotropium (SPIRIVA HANDIHALER) 18 MCG inhalation capsule INHALE CONTENTS OF ONE CAPSULE (18 mcg) ONCE DAILY FOR COPD. 30 capsule 5   No current facility-administered medications on file prior to visit.    Allergies  Allergen Reactions  . Sulfa Antibiotics Nausea And Vomiting  . Tetracyclines & Related    Family History  Problem Relation Age of Onset  . Heart disease Father     CHF  . Cancer Mother     breast  . Seizures Sister    PE: BP 110/82   Pulse 83   Ht 5' 4.5" (1.638 m)   Wt 160 lb 6.4 oz (72.8 kg)   LMP 02/16/1975   SpO2 94%   BMI 27.11 kg/m  Wt Readings from Last 3 Encounters:  02/28/16 160 lb 6.4 oz (72.8 kg)  01/26/16  160 lb (72.6 kg)  01/15/16 163 lb 3.2 oz (74 kg)   Constitutional: overweight, in NAD, Walks with a cane  Eyes: PERRLA, EOMI, no exophthalmos ENT: moist mucous membranes, no thyromegaly, but right thyroid nodule clearly visible and palpable, no cervical lymphadenopathy, but palpable bilateral osseous prominences above the thyroid cartilage (?? Hyoid bone horn protuberance) Cardiovascular: RRR, No MRG Respiratory: CTA B Gastrointestinal: abdomen soft, NT, ND, BS+ Musculoskeletal: no deformities, strength intact in all 4;  Skin: moist, warm, + multiple disseminated ecchymosis, + paper thin skin Neurological: no tremor with outstretched hands, DTR normal in all 4  ASSESSMENT: 1. R thyroid nodule - FDG avid  2. Hashimoto's thyroiditis hx  PLAN: 1. R thyroid nodule - I reviewed together with the patient previous imaging an FNA results. I explained that the right sided thyroid nodule, which is 2.3 cm in the largest dimension appears to be FDG avid, suggesting increased activity. This can happen in the setting of autoimmune hypo-or hyperthyroidism, it can also happen with toxic nodules, or with thyroid cancer. Her TSH level has not been suppressed, however, the most recent level was  from last year. We will repeat the level today. If the TSH is low, we may need an uptake and scan to check if the right thyroid nodule is overactive. However, if the TSH is normal, since the previous biopsy has returned indeterminant (15-30% risk of cancer), I suggested another biopsy with [facility] molecular marker panel to rule out a diagnosis of cancer. If she has a diagnosis of thyroid cancer, the standard management is thyroidectomy. I did explain the thyroid cancer is usually very indolent type of cancer, and it most likely would not bother her in the next 10-20 years. The patient tells me that she feels that she is frail for her age and would not want to pursue another FNA or any thyroid surgery in the future. I explained that it is possible that her thyroid and nodules may be related to the thyroid nodule, in case this is cancerous. Despite this, patient would not want to pursue further investigation of the nodule, including a possible follow-up ultrasound in a year from now. In this case, I suggested to let me know if she develops neck compression symptoms, in which case, she will return for another ultrasound. - I'll see her back as needed - she will contact me if she changes her mind about taking a more aggressive approach to her nodule or if she develops neck compression sxs   2. Hashimoto's thyroiditis hx - Will check her TFTs today   Office Visit on 02/28/2016  Component Date Value Ref Range Status  . TSH 02/28/2016 1.51  0.35 - 4.50 uIU/mL Final  . T3, Free 02/28/2016 3.3  2.3 - 4.2 pg/mL Final  . Free T4 02/28/2016 0.86  0.60 - 1.60 ng/dL Final   Comment: Specimens from patients who are undergoing biotin therapy and /or ingesting biotin supplements may contain high levels of biotin.  The higher biotin concentration in these specimens interferes with this Free T4 assay.  Specimens that contain high levels  of biotin may cause false high results for this Free T4 assay.  Please interpret  results in light of the total clinical presentation of the patient.    TFTs are normal.  CC:  - Dr. Ubaldo Glassing - Dr. Marlou Porch  Philemon Kingdom, MD PhD [facility]

## 2016-05-09 ENCOUNTER — Other Ambulatory Visit: Payer: Self-pay | Admitting: Neurology

## 2016-06-17 ENCOUNTER — Telehealth: Payer: Self-pay | Admitting: Internal Medicine

## 2016-06-17 NOTE — Telephone Encounter (Signed)
FYI - order was placed on 01/18/16 for pt to have CT in April.  When I called pt to give her the appt info she stated she does not want to get a CT at this time.  She said she still wants to see Dr Chase Caller but if something is found on CT she does not want surgery so no need to spend the money.  She has appt in the office on 5/3.

## 2016-06-19 NOTE — Telephone Encounter (Signed)
k tjhat is fine. Also, please thank patient for sending me a nice card with [facility] written on it  Dr. Brand Males, M.D., [facility].C.P Pulmonary and Critical Care Medicine Staff Physician [place] [facility] Pager: [phone], If no answer or between  15:00h - 7:00h: call [phone]  06/19/2016 5:20 PM

## 2016-07-01 ENCOUNTER — Ambulatory Visit: Payer: Medicare Other | Admitting: Nurse Practitioner

## 2016-07-18 ENCOUNTER — Other Ambulatory Visit: Payer: Medicare Other

## 2016-08-08 ENCOUNTER — Ambulatory Visit: Payer: Medicare Other | Admitting: Internal Medicine

## 2016-09-05 ENCOUNTER — Ambulatory Visit (INDEPENDENT_AMBULATORY_CARE_PROVIDER_SITE_OTHER): Payer: Medicare Other | Admitting: Internal Medicine

## 2016-09-05 ENCOUNTER — Encounter: Payer: Self-pay | Admitting: Internal Medicine

## 2016-09-05 VITALS — BP 144/88 | HR 81 | Ht 64.5 in | Wt 144.6 lb

## 2016-09-05 DIAGNOSIS — R918 Other nonspecific abnormal finding of lung field: Secondary | ICD-10-CM

## 2016-09-05 DIAGNOSIS — J431 Panlobular emphysema: Secondary | ICD-10-CM

## 2016-09-05 NOTE — Progress Notes (Signed)
Subjective:     Patient ID: [name], female   DOB: March 19, 1933, 83 y.o.   MRN: [id number]  HPI   OV 10/30/2015  Chief Complaint  Patient presents with  . Follow-up    Pt c/o occasional dry cough. Pt denies SOB/wheeze/CP/tightness. Pt states that as long as she uses her inhalers she does very well.    83 year old female follow-up for multiple issues  #Emphysema isolated low DLCO and CT evidence without out of proportion shortness of breath in setting of normal cardiac stress test and echocardiogram: At last visit 6 months ago she felt that pulmonary rehabilitation would not be that useful and would prefer to do it as a physical therapy in her friend's home. She did not do the physical therapy up until 3 weeks ago when she is doing it more for balance issues. Nevertheless she states dyspnea stable. I discovered today that she is only taking Spiriva scheduled. She is only taking the Brio as needed which she is taking 3 times a month. She does not want to do albuterol for rescue but instead prefers the [facility] for needing to "feel safe"  #Multiple lung nodules: Last CT chest without contrast was December 2016. Nodules were stable one of them had resolved and one of them had diminished. Recommendation was to do CT chest December 2017. But in July 2017. Abdominal CT and this shows an enlarging left lower lobe pulmonary nodule subpleural groundglass opacity. This was compared to March 2016. She is concerned about this. She does not have hemoptysis or weight loss. I personally visualized the CT chest and agree    OV 11/10/2015  Chief Complaint  Patient presents with  . Follow-up    Pt here after PET scan. Pt denies breathing changes since last OV. Pt denies any new complaints at this time.    Presents with all her sisters to discuss results of Ct chest abnd PET scan  # LUNG NODULE CT chest 11/03/15: personally visualised and PET scan 11/08/15 1. LUL nodule is 1.3cm  - very slowly enlarging over  time - LOW uptake 2. LLL nodule - 1.8cm - INTERMEDIATE UPDTAKE  Given age, smoking hx and size and slow grwoth - likely we are dealing with BAC  #New issue  - PET shows high uptake in right side of thyroid   OV 01/15/2016  Chief Complaint  Patient presents with  . Hospitalization Follow-up    Per Dr. Lamonte Sakai, Pt. states she had a bronchoscopy, Pt. states her breathing is doing ok, Pt c/o of sob, some wheezing, Pt. denies chest pain and chest tightness     FU ENB results and thyroiud bx results  Lung nodule: She is her by herself. Middle Sister Inez Catalina not here. Derryl Harbor not here. Bx by DR Bryum non-diagnostic. PErcepta research registry test is indeterminate. Given she is 56 she wants very conservative Rx  Thyroid bx: folloicular atypica. She says on internet it says 5% chance of cancer. SHe does not want thyroidectomy and questions need for endocrine referrral.   OVerall well. Dyspnea stable. Saw Dr Marlou Porch cardiac stress normal. Is uptodate with high dose flu shot  OV 09/05/2016  Chief Complaint  Patient presents with  . Follow-up    Pt here for 6 month f/u, pt was to have a CT chest but this was not done. Pt denies changes since last OV. Pt states she is feeling well. Pt deines cough, CP/tightness.     Fu lung nodule: indeterminate. Suspect BAC if  is cancer. Last CT July 2017. Was supposed to have fu CT but held off due to goals of care. Wants to postpone. Feels well from resp standpoint.   Emphysema: doing well on spiriva   has a past medical history of Acute bronchitis (05/23/2011); Acute upper respiratory infections of unspecified site (05/23/2011); Arthritis; Chronic airway obstruction, not elsewhere classified (05/23/2011); Disturbance of salivary secretion (01/31/2011); Dizziness and giddiness (01/31/2011); Essential tremor (04/25/2014); External hemorrhoids without mention of complication (50/12/3816); Gait disorder (04/25/2014); Insomnia, unspecified (09/12/2011); Lumbago  (01/31/2011); Major depressive disorder, single episode, unspecified (01/31/2011); Memory disorder (04/25/2014); Mitral valve disorders(424.0) (01/31/2011); Other and unspecified hyperlipidemia (01/31/2011); Other convulsions (01/31/2011); Other emphysema ([place]) (01/31/2011); Pain in joint, site unspecified (01/31/2011); Restless legs syndrome (RLS) (09/12/2011); Retinal detachment with retinal defect of right eye (2011); Senile osteoporosis (01/31/2011); Spontaneous ecchymoses (01/31/2011); Stiffness of joints, not elsewhere classified, multiple sites (01/31/2011); and Unspecified essential hypertension (01/31/2011).   reports that she quit smoking about 28 years ago. Her smoking use included Cigarettes. She has a 40.00 pack-year smoking history. She has never used smokeless tobacco.  Past Surgical History:  Procedure Laterality Date  . ABDOMINAL HYSTERECTOMY  06/21/2003   TAH/BSO, omenectomy PSB resect, Stg IC cystadenofibroma  . CHOLECYSTECTOMY  2005   Dr. Marlou Starks  . ELBOW SURGERY Right 2008   broken   Dr. Apolonio Schneiders  . EYE SURGERY    . RETINAL DETACHMENT SURGERY N/A    two  . ROTATOR CUFF REPAIR Right 2012   Dr. Theda Sers  . SQUAMOUS CELL CARCINOMA EXCISION Bilateral 2012, 8/14   [place] on legs   Dr. Sarajane Jews  . TONSILLECTOMY  1941  . VIDEO BRONCHOSCOPY WITH ENDOBRONCHIAL NAVIGATION N/A 11/29/2015   Procedure: VIDEO BRONCHOSCOPY WITH ENDOBRONCHIAL NAVIGATION;  Surgeon: Collene Gobble, MD;  Location: [facility];  Service: Thoracic;  Laterality: N/A;    Allergies  Allergen Reactions  . Sulfa Antibiotics Nausea And Vomiting  . Tetracyclines & Related     Immunization History  Administered Date(s) Administered  . Influenza Split 01/06/2014  . Influenza Whole 01/07/2012, 01/06/2013  . Influenza, High Dose Seasonal PF 12/25/2015  . Influenza,inj,Quad PF,36+ Mos 12/21/2014  . Pneumococcal Conjugate-13 02/01/2014  . Pneumococcal Polysaccharide-23 04/08/2004  . Td 04/08/2002  . Tdap 04/09/2011  .  Zoster 04/08/2008    Family History  Problem Relation Age of Onset  . Heart disease Father        CHF  . Cancer Mother        breast  . Seizures Sister      Current Outpatient Prescriptions:  .  albuterol (PROVENTIL HFA;VENTOLIN HFA) 108 (90 BASE) MCG/ACT inhaler, Inhale 2 puffs into the lungs every 4 (four) hours as needed for wheezing or shortness of breath., Disp: 8 g, Rfl: 5 .  alclomethasone (ACLOVATE) 0.05 % cream, Apply 1 application topically 3 (three) times a week. , Disp: , Rfl:  .  alendronate (FOSAMAX) 70 MG tablet, Take 70 mg by mouth every Monday. , Disp: , Rfl:  .  aspirin 81 MG tablet, Take 81 mg by mouth daily., Disp: , Rfl:  .  cholecalciferol (VITAMIN D) 1000 units tablet, Take 1 tablet (1,000 Units total) by mouth daily., Disp: , Rfl:  .  hydrocortisone 2.5 % cream, Apply 1 application topically as needed (for psoriasis). , Disp: , Rfl:  .  ketoconazole (NIZORAL) 2 % cream, Apply 1 application topically as needed (for psoriasis). , Disp: , Rfl:  .  lamoTRIgine (LAMICTAL) 150 MG tablet, TAKE 1 TABLET (150  mg) every morning and every evening.  Take 1/2 tablet (75 mg) at lunch, Disp: 225 tablet, Rfl: 5 .  levETIRAcetam (KEPPRA) 500 MG tablet, TAKE (1/2) TABLET THREE TIMES DAILY., Disp: 135 tablet, Rfl: 3 .  memantine (NAMENDA) 10 MG tablet, TAKE 1 TABLET TWICE DAILY., Disp: 180 tablet, Rfl: 3 .  pramipexole (MIRAPEX) 0.25 MG tablet, TAKE 1 TABLET ONCE DAILY., Disp: 90 tablet, Rfl: 3 .  simvastatin (ZOCOR) 10 MG tablet, Take 10 mg by mouth daily. Reported on 06/27/2015, Disp: , Rfl:  .  tiotropium (SPIRIVA HANDIHALER) 18 MCG inhalation capsule, INHALE CONTENTS OF ONE CAPSULE (18 mcg) ONCE DAILY FOR COPD. (Patient taking differently: as needed. INHALE CONTENTS OF ONE CAPSULE (18 mcg) ONCE DAILY FOR COPD.), Disp: 30 capsule, Rfl: 5   Review of Systems     Objective:   Physical Exam  Vitals:   09/05/16 1040  BP: (!) 144/88  Pulse: 81  SpO2: 95%  Weight: 144 lb 9.6  oz (65.6 kg)  Height: 5' 4.5" (1.638 m)    Estimated body mass index is 24.44 kg/m as calculated from the following:   Height as of this encounter: 5' 4.5" (1.638 m).   Weight as of this encounter: 144 lb 9.6 oz (65.6 kg).  kyphoptic plesaant Mild basal crackles No wheeze Uses cane loosk well Lost weight intentionally     Assessment:       ICD-9-CM ICD-10-CM   1. Multiple lung nodules on CT 793.19 R91.8   2. Panlobular emphysema ([place]) 492.8 J43.1        Plan:       #Multiple lung nodules on CT - This is significantly improved as of December 2016 but  But larger Left lower lobe nodule July 2017  -non-diagnostic bronch ENB August 2017  - Cancer probability is lower but still not ruled out - repeat CT chest wo contrast in may 2018 held off - she agreed to CT after we discussed role of empriic XRT in high suspect cases of early lng cancer - plese do repeat ct chest wo contrast 6 months from 09/05/2016   -  #Emphysema  - stable -Continue Spiriva scheduled as before   #Follow-up - 6 months routine follow-up after  CT chest   (> 50% of this 15 min visit spent in face to face counseling or/and coordination of care)   Dr. Brand Males, M.D., [facility].C.P Pulmonary and Critical Care Medicine Staff Physician [place] [facility] Pager: [phone], If no answer or between  15:00h - 7:00h: call [phone]  09/05/2016 11:01 AM

## 2016-09-05 NOTE — Patient Instructions (Addendum)
ICD-9-CM ICD-10-CM   1. Multiple lung nodules on CT 793.19 R91.8      #Multiple lung nodules on CT - This is significantly improved as of December 2016 but  But larger Left lower lobe nodule July 2017  -non-diagnostic bronch ENB August 2017  - Cancer probability is lower but still not ruled out - repeat CT chest wo contrast in may 2018 held off - plese do repeat ct chest wo contrast 6 months from 09/05/2016   -  #Emphysema  - stable -Continue Spiriva scheduled as before   #Follow-up - 6 months routine follow-up after  CT chest

## 2017-01-27 ENCOUNTER — Ambulatory Visit (INDEPENDENT_AMBULATORY_CARE_PROVIDER_SITE_OTHER): Payer: Medicare Other | Admitting: Neurology

## 2017-01-27 ENCOUNTER — Encounter: Payer: Self-pay | Admitting: Neurology

## 2017-01-27 VITALS — BP 150/95 | HR 95 | Ht 64.5 in | Wt 145.0 lb

## 2017-01-27 DIAGNOSIS — R413 Other amnesia: Secondary | ICD-10-CM | POA: Diagnosis not present

## 2017-01-27 DIAGNOSIS — G2581 Restless legs syndrome: Secondary | ICD-10-CM

## 2017-01-27 DIAGNOSIS — G40909 Epilepsy, unspecified, not intractable, without status epilepticus: Secondary | ICD-10-CM

## 2017-01-27 DIAGNOSIS — Z5181 Encounter for therapeutic drug level monitoring: Secondary | ICD-10-CM

## 2017-01-27 MED ORDER — LEVETIRACETAM 500 MG PO TABS: ORAL_TABLET | ORAL | 3 refills | Status: DC

## 2017-01-27 MED ORDER — LAMOTRIGINE 150 MG PO TABS: 150.0000 mg | ORAL_TABLET | Freq: Two times a day (BID) | ORAL | 3 refills | Status: DC

## 2017-01-27 NOTE — Progress Notes (Signed)
Reason for visit: Seizures  [name] is an 83 y.o. female  History of present illness:  [name] is an 83 year old right-handed white female with a history of seizures that have been well-controlled on Keppra and Lamictal. The patient has not been operating a motor vehicle. She reports some troubles with gait instability, she uses a cane for ambulation, she reports no recent falls. She also has restless leg syndrome that is easily controlled with low-dose Mirapex. She has gone on a diet and she has lost 20 pounds since last seen, she walks 3-4 miles a day. She reports a mild memory disturbance, this has not worsened much over time, she has not altered activities of daily living because of memory. She is on Namenda and continues on this medication. She returns to this office for further evaluation.  Past Medical History:  Diagnosis Date  . Acute bronchitis 05/23/2011  . Acute upper respiratory infections of unspecified site 05/23/2011  . Arthritis   . Chronic airway obstruction, not elsewhere classified 05/23/2011  . Disturbance of salivary secretion 01/31/2011  . Dizziness and giddiness 01/31/2011  . Essential tremor 04/25/2014  . External hemorrhoids without mention of complication 62/83/1517  . Gait disorder 04/25/2014  . Insomnia, unspecified 09/12/2011  . Lumbago 01/31/2011  . Major depressive disorder, single episode, unspecified 01/31/2011  . Memory disorder 04/25/2014  . Mitral valve disorders(424.0) 01/31/2011  . Other and unspecified hyperlipidemia 01/31/2011  . Other convulsions 01/31/2011  . Other emphysema ([place]) 01/31/2011  . Pain in joint, site unspecified 01/31/2011  . Restless legs syndrome (RLS) 09/12/2011  . Retinal detachment with retinal defect of right eye 2011   right eye twice  . Senile osteoporosis 01/31/2011  . Spontaneous ecchymoses 01/31/2011  . Stiffness of joints, not elsewhere classified, multiple sites 01/31/2011  . Unspecified essential hypertension  01/31/2011    Past Surgical History:  Procedure Laterality Date  . ABDOMINAL HYSTERECTOMY  06/21/2003   TAH/BSO, omenectomy PSB resect, Stg IC cystadenofibroma  . CHOLECYSTECTOMY  2005   Dr. Marlou Starks  . ELBOW SURGERY Right 2008   broken   Dr. Apolonio Schneiders  . EYE SURGERY    . RETINAL DETACHMENT SURGERY N/A    two  . ROTATOR CUFF REPAIR Right 2012   Dr. Theda Sers  . SQUAMOUS CELL CARCINOMA EXCISION Bilateral 2012, 8/14   [place] on legs   Dr. Sarajane Jews  . TONSILLECTOMY  1941  . VIDEO BRONCHOSCOPY WITH ENDOBRONCHIAL NAVIGATION N/A 11/29/2015   Procedure: VIDEO BRONCHOSCOPY WITH ENDOBRONCHIAL NAVIGATION;  Surgeon: Collene Gobble, MD;  Location: [facility] OR;  Service: Thoracic;  Laterality: N/A;    Family History  Problem Relation Age of Onset  . Heart disease Father        CHF  . Cancer Mother        breast  . Seizures Sister     Social history:  reports that she quit smoking about 28 years ago. Her smoking use included Cigarettes. She has a 40.00 pack-year smoking history. She has never used smokeless tobacco. She reports that she does not drink alcohol or use drugs.    Allergies  Allergen Reactions  . Sulfa Antibiotics Nausea And Vomiting  . Tetracyclines & Related     Medications:  Prior to Admission medications   Medication Sig Start Date End Date Taking? Authorizing Provider  albuterol (PROVENTIL HFA;VENTOLIN HFA) 108 (90 BASE) MCG/ACT inhaler Inhale 2 puffs into the lungs every 4 (four) hours as needed for wheezing or shortness of breath.  09/27/14  Yes Parrett, Tammy S, NP  alclomethasone (ACLOVATE) 0.05 % cream Apply 1 application topically 3 (three) times a week.  09/10/13  Yes [provider]  alendronate (FOSAMAX) 70 MG tablet Take 70 mg by mouth every Monday.  10/14/15  Yes [provider]  aspirin 81 MG tablet Take 81 mg by mouth daily.   Yes [provider]  cholecalciferol (VITAMIN D) 1000 units tablet Take 1 tablet (1,000 Units total) by mouth daily. 11/29/15   Yes Collene Gobble, MD  hydrocortisone 2.5 % cream Apply 1 application topically as needed (for psoriasis).  09/20/15  Yes [provider]  ketoconazole (NIZORAL) 2 % cream Apply 1 application topically as needed (for psoriasis).  09/20/15  Yes [provider]  lamoTRIgine (LAMICTAL) 150 MG tablet TAKE 1 TABLET (150 mg) every morning and every evening.  Take 1/2 tablet (75 mg) at lunch 11/29/15  Yes Byrum, Rose Fillers, MD  levETIRAcetam (KEPPRA) 500 MG tablet TAKE (1/2) TABLET THREE TIMES DAILY. 02/02/16  Yes [name], MD  memantine (NAMENDA) 10 MG tablet TAKE 1 TABLET TWICE DAILY. 05/09/16  Yes [name], MD  pramipexole (MIRAPEX) 0.25 MG tablet TAKE 1 TABLET ONCE DAILY. 05/09/16  Yes [name], MD  simvastatin (ZOCOR) 10 MG tablet Take 10 mg by mouth daily. Reported on 06/27/2015   Yes [provider]  tiotropium (SPIRIVA HANDIHALER) 18 MCG inhalation capsule INHALE CONTENTS OF ONE CAPSULE (18 mcg) ONCE DAILY FOR COPD. Patient taking differently: as needed. INHALE CONTENTS OF ONE CAPSULE (18 mcg) ONCE DAILY FOR COPD. 11/29/15  Yes Byrum, Rose Fillers, MD    ROS:  Out of a complete 14 system review of symptoms, the patient complains only of the following symptoms, and all other reviewed systems are negative.  Memory loss Restless legs, frequent waking Joint pain, back pain, walking difficulty  Blood pressure (!) 150/95, pulse 95, height 5' 4.5" (1.638 m), weight 145 lb (65.8 kg), last menstrual period 02/16/1975.  Physical Exam  General: The patient is alert and cooperative at the time of the examination.  Skin: No significant peripheral edema is noted.   Neurologic Exam  Mental status: The patient is alert and oriented x 3 at the time of the examination. The patient has apparent normal recent and remote memory, with an apparently normal attention span and concentration ability. Mini-Mental Status Examination done today shows a total score  30/30.   Cranial nerves: Facial symmetry is present. Speech is normal, no aphasia or dysarthria is noted. Extraocular movements are full. Visual fields are full.  Motor: The patient has good strength in all 4 extremities.  Sensory examination: Soft touch sensation is symmetric on the face, arms, and legs.  Coordination: The patient has good finger-nose-finger and heel-to-shin bilaterally.  Gait and station: The patient has a minimally wide-based gait, the patient usually walks with a cane. Tandem gait is unsteady. Romberg is negative, but is unsteady. No drift is seen.  Reflexes: Deep tendon reflexes are symmetric, with exception of a reduction in the right biceps reflex as compared to the left.   Assessment/Plan:  1. Mild memory disturbance  2. History of seizures, well controlled  3. Restless leg syndrome  4. Gait disturbance  The patient will undergo blood work today to check Keppra and Lamictal levels, she will be given prescriptions for the Lamictal and Keppra today. The patient continue Mirapex and the Namenda. She will follow-up in one year, sooner if needed. Overall, she seems  to be doing fairly well.  Jill Alexanders MD 01/27/2017 10:35 AM  [name] [address] [place], [place] [address]  Phone [phone] Fax [phone]

## 2017-01-28 LAB — CBC WITH DIFFERENTIAL/PLATELET
BASOS: 1 %
Basophils Absolute: 0 10*3/uL (ref 0.0–0.2)
EOS (ABSOLUTE): 0.2 10*3/uL (ref 0.0–0.4)
Eos: 3 %
HEMOGLOBIN: 14.9 g/dL (ref 11.1–15.9)
Hematocrit: 46.3 % (ref 34.0–46.6)
Immature Grans (Abs): 0 10*3/uL (ref 0.0–0.1)
Immature Granulocytes: 0 %
LYMPHS ABS: 1.1 10*3/uL (ref 0.7–3.1)
Lymphs: 20 %
MCH: 30.6 pg (ref 26.6–33.0)
MCHC: 32.2 g/dL (ref 31.5–35.7)
MCV: 95 fL (ref 79–97)
MONOCYTES: 13 %
Monocytes Absolute: 0.7 10*3/uL (ref 0.1–0.9)
Neutrophils Absolute: 3.5 10*3/uL (ref 1.4–7.0)
Neutrophils: 63 %
Platelets: 218 10*3/uL (ref 150–379)
RBC: 4.87 x10E6/uL (ref 3.77–5.28)
RDW: 13.3 % (ref 12.3–15.4)
WBC: 5.5 10*3/uL (ref 3.4–10.8)

## 2017-01-28 LAB — COMPREHENSIVE METABOLIC PANEL
A/G RATIO: 1.8 (ref 1.2–2.2)
ALBUMIN: 4.6 g/dL (ref 3.5–4.7)
ALT: 15 IU/L (ref 0–32)
AST: 21 IU/L (ref 0–40)
Alkaline Phosphatase: 107 IU/L (ref 39–117)
BILIRUBIN TOTAL: 0.5 mg/dL (ref 0.0–1.2)
BUN / CREAT RATIO: 21 (ref 12–28)
BUN: 18 mg/dL (ref 8–27)
CHLORIDE: 102 mmol/L (ref 96–106)
CO2: 23 mmol/L (ref 20–29)
Calcium: 9.7 mg/dL (ref 8.7–10.3)
Creatinine, Ser: 0.87 mg/dL (ref 0.57–1.00)
GFR calc non Af Amer: 63 mL/min/{1.73_m2} (ref >59)
GFR, EST AFRICAN AMERICAN: 72 mL/min/{1.73_m2} (ref >59)
Globulin, Total: 2.6 g/dL (ref 1.5–4.5)
Glucose: 75 mg/dL (ref 65–99)
POTASSIUM: 4.8 mmol/L (ref 3.5–5.2)
Sodium: 143 mmol/L (ref 134–144)
TOTAL PROTEIN: 7.2 g/dL (ref 6.0–8.5)

## 2017-01-28 LAB — LAMOTRIGINE LEVEL: LAMOTRIGINE LVL: 9.7 ug/mL (ref 2.0–20.0)

## 2017-01-28 LAB — LEVETIRACETAM LEVEL: Levetiracetam Lvl: 11.1 ug/mL (ref 10.0–40.0)

## 2017-01-29 ENCOUNTER — Encounter: Payer: Self-pay | Admitting: Neurology

## 2017-03-03 ENCOUNTER — Ambulatory Visit (INDEPENDENT_AMBULATORY_CARE_PROVIDER_SITE_OTHER)
Admission: RE | Admit: 2017-03-03 | Discharge: 2017-03-03 | Disposition: A | Discharge status: Home / Self Care | Payer: Medicare Other | Source: Ambulatory Visit | Attending: Internal Medicine | Admitting: Internal Medicine

## 2017-03-03 DIAGNOSIS — R918 Other nonspecific abnormal finding of lung field: Secondary | ICD-10-CM | POA: Diagnosis not present

## 2017-03-04 ENCOUNTER — Encounter: Payer: Self-pay | Admitting: Internal Medicine

## 2017-03-04 ENCOUNTER — Ambulatory Visit: Payer: Medicare Other | Admitting: Internal Medicine

## 2017-03-04 VITALS — BP 142/82 | HR 85 | Ht 63.0 in | Wt 146.2 lb

## 2017-03-04 DIAGNOSIS — R918 Other nonspecific abnormal finding of lung field: Secondary | ICD-10-CM

## 2017-03-04 DIAGNOSIS — J431 Panlobular emphysema: Secondary | ICD-10-CM | POA: Diagnosis not present

## 2017-03-04 NOTE — Progress Notes (Signed)
Subjective:     Patient ID: [name], female   DOB: July 24, 1932, 83 y.o.   MRN: [id number]  HPI    OV 10/30/2015  Chief Complaint  Patient presents with  . Follow-up    Pt c/o occasional dry cough. Pt denies SOB/wheeze/CP/tightness. Pt states that as long as she uses her inhalers she does very well.    83 year old female follow-up for multiple issues  #Emphysema isolated low DLCO and CT evidence without out of proportion shortness of breath in setting of normal cardiac stress test and echocardiogram: At last visit 6 months ago she felt that pulmonary rehabilitation would not be that useful and would prefer to do it as a physical therapy in her friend's home. She did not do the physical therapy up until 3 weeks ago when she is doing it more for balance issues. Nevertheless she states dyspnea stable. I discovered today that she is only taking Spiriva scheduled. She is only taking the Brio as needed which she is taking 3 times a month. She does not want to do albuterol for rescue but instead prefers the [facility] for needing to "feel safe"  #Multiple lung nodules: Last CT chest without contrast was December 2016. Nodules were stable one of them had resolved and one of them had diminished. Recommendation was to do CT chest December 2017. But in July 2017. Abdominal CT and this shows an enlarging left lower lobe pulmonary nodule subpleural groundglass opacity. This was compared to March 2016. She is concerned about this. She does not have hemoptysis or weight loss. I personally visualized the CT chest and agree    OV 11/10/2015  Chief Complaint  Patient presents with  . Follow-up    Pt here after PET scan. Pt denies breathing changes since last OV. Pt denies any new complaints at this time.    Presents with all her sisters to discuss results of Ct chest abnd PET scan  # LUNG NODULE CT chest 11/03/15: personally visualised and PET scan 11/08/15 1. LUL nodule is 1.3cm  - very slowly enlarging  over time - LOW uptake 2. LLL nodule - 1.8cm - INTERMEDIATE UPDTAKE  Given age, smoking hx and size and slow grwoth - likely we are dealing with BAC  #New issue  - PET shows high uptake in right side of thyroid   OV 01/15/2016  Chief Complaint  Patient presents with  . Hospitalization Follow-up    Per Dr. Lamonte Sakai, Pt. states she had a bronchoscopy, Pt. states her breathing is doing ok, Pt c/o of sob, some wheezing, Pt. denies chest pain and chest tightness     FU ENB results and thyroiud bx results  Lung nodule: She is her by herself. Middle Sister Inez Catalina not here. Derryl Harbor not here. Bx by DR Bryum non-diagnostic. PErcepta research registry test is indeterminate. Given she is 52 she wants very conservative Rx  Thyroid bx: folloicular atypica. She says on internet it says 5% chance of cancer. SHe does not want thyroidectomy and questions need for endocrine referrral.   OVerall well. Dyspnea stable. Saw Dr Marlou Porch cardiac stress normal. Is uptodate with high dose flu shot  OV 09/05/2016  Chief Complaint  Patient presents with  . Follow-up    Pt here for 6 month f/u, pt was to have a CT chest but this was not done. Pt denies changes since last OV. Pt states she is feeling well. Pt deines cough, CP/tightness.     Fu lung nodule: indeterminate. Suspect BAC  if is cancer. Last CT July 2017. Was supposed to have fu CT but held off due to goals of care. Wants to postpone. Feels well from resp standpoint.   Emphysema: doing well on spiriva   OV 03/04/2017  Chief Complaint  Patient presents with  . Follow-up    CT 03/03/17.  Pt states that she has been doing good since last visit. Denies any complaints of SOB, cough, or CP.     Follow-up lung nodules indeterminate left upper lobe and left lower lobe: She opted for expectant follow-up.  She had her CT scan at the 39-monthstandpoint since July 2017.  The results are detailed below.  I personally visualized these results and agree  with the findings.  She does not want anymore follow-up of the nodules  Emphysema: Hardly any symptoms.  She only takes Spiriva as needed.  She is up-to-date with the flu shot.  She prefers to keep things as this  Ct Chest Wo Contrast  Result Date: 03/03/2017 CLINICAL DATA:  Followup pulmonary nodules. EXAM: CT CHEST WITHOUT CONTRAST TECHNIQUE: Multidetector CT imaging of the chest was performed following the standard protocol without IV contrast. COMPARISON:  PET-CT 11/08/2015 and chest CT 10/26/2015 FINDINGS: Cardiovascular: The heart is normal in size and stable. No pericardial effusion. Stable tortuosity, ectasia and calcification of the thoracic aorta. Stable coronary artery calcifications. Mediastinum/Nodes: No mediastinal or hilar mass or adenopathy. Small scattered lymph nodes are stable. The esophagus is grossly normal. Lungs/Pleura: Stable advanced emphysematous changes and pulmonary scarring. Stable patchy peripheral tree-in-bud pattern, likely chronic inflammation or atypical infection such as MAC. Overall improved when compared to the prior study. Stable nodular density along the right major fissure on image number 82, likely subpleural scarring changes. The nodular opacity seen near the left hilum in the left upper lobe has significantly improved. There is some residual old nodular scarring changes but no discrete lesion. This is most likely postinflammatory scarring. No new/acute overlying pulmonary process. No new worrisome pulmonary lesions. No pleural effusion. Upper Abdomen: No significant upper abdominal findings. Stable hepatic hemangiomas. Stable advanced atherosclerotic calcifications involving the aorta and branch vessels. Musculoskeletal: No breast masses, supraclavicular or axillary lymphadenopathy. Stable severe scoliosis and degenerative thoracic and lumbar spondylosis and remote compression fractures. IMPRESSION: 1. Stable advanced emphysematous changes and areas of pulmonary  scarring. 2. Slightly improved patchy peripheral tree-in-bud pattern, most likely chronic inflammation or atypical infection. 3. Left upper lobe nodular process has improved and is likely postinflammatory scarring changes. No discrete lesion. 4. Stable nodular density near the right major fissure since 2016, considered benign. 5. No acute pulmonary findings. Aortic Atherosclerosis (ICD10-I70.0) and Emphysema (ICD10-J43.9). Electronically Signed   By: PMarijo SanesM.D.   On: 03/03/2017 12:57       has a past medical history of Acute bronchitis (05/23/2011), Acute upper respiratory infections of unspecified site (05/23/2011), Arthritis, Chronic airway obstruction, not elsewhere classified (05/23/2011), Disturbance of salivary secretion (01/31/2011), Dizziness and giddiness (01/31/2011), Essential tremor (04/25/2014), External hemorrhoids without mention of complication (160/45/4098, Gait disorder (04/25/2014), Insomnia, unspecified (09/12/2011), Lumbago (01/31/2011), Major depressive disorder, single episode, unspecified (01/31/2011), Memory disorder (04/25/2014), Mitral valve disorders(424.0) (01/31/2011), Other and unspecified hyperlipidemia (01/31/2011), Other convulsions (01/31/2011), Other emphysema ([place] (01/31/2011), Pain in joint, site unspecified (01/31/2011), Restless legs syndrome (RLS) (09/12/2011), Retinal detachment with retinal defect of right eye (2011), Senile osteoporosis (01/31/2011), Spontaneous ecchymoses (01/31/2011), Stiffness of joints, not elsewhere classified, multiple sites (01/31/2011), and Unspecified essential hypertension (01/31/2011).   reports that she quit smoking about  28 years ago. Her smoking use included cigarettes. She has a 40.00 pack-year smoking history. she has never used smokeless tobacco.  Past Surgical History:  Procedure Laterality Date  . ABDOMINAL HYSTERECTOMY  06/21/2003   TAH/BSO, omenectomy PSB resect, Stg IC cystadenofibroma  . CHOLECYSTECTOMY  2005   Dr. Marlou Starks   . ELBOW SURGERY Right 2008   broken   Dr. Apolonio Schneiders  . EYE SURGERY    . RETINAL DETACHMENT SURGERY N/A    two  . ROTATOR CUFF REPAIR Right 2012   Dr. Theda Sers  . SQUAMOUS CELL CARCINOMA EXCISION Bilateral 2012, 8/14   [place] on legs   Dr. Sarajane Jews  . TONSILLECTOMY  1941  . VIDEO BRONCHOSCOPY WITH ENDOBRONCHIAL NAVIGATION N/A 11/29/2015   Procedure: VIDEO BRONCHOSCOPY WITH ENDOBRONCHIAL NAVIGATION;  Surgeon: Collene Gobble, MD;  Location: [facility] OR;  Service: Thoracic;  Laterality: N/A;    Allergies  Allergen Reactions  . Sulfa Antibiotics Nausea And Vomiting  . Tetracyclines & Related     Immunization History  Administered Date(s) Administered  . Influenza Split 01/06/2014  . Influenza Whole 01/07/2012, 01/06/2013  . Influenza, High Dose Seasonal PF 12/25/2015, 01/20/2017  . Influenza,inj,Quad PF,6+ Mos 12/21/2014  . Pneumococcal Conjugate-13 02/01/2014  . Pneumococcal Polysaccharide-23 04/08/2004  . Td 04/08/2002  . Tdap 04/09/2011  . Zoster 04/08/2008    Family History  Problem Relation Age of Onset  . Heart disease Father        CHF  . Cancer Mother        breast  . Seizures Sister      Current Outpatient Medications:  .  albuterol (PROVENTIL HFA;VENTOLIN HFA) 108 (90 BASE) MCG/ACT inhaler, Inhale 2 puffs into the lungs every 4 (four) hours as needed for wheezing or shortness of breath., Disp: 8 g, Rfl: 5 .  alclomethasone (ACLOVATE) 0.05 % cream, Apply 1 application topically 3 (three) times a week. , Disp: , Rfl:  .  alendronate (FOSAMAX) 70 MG tablet, Take 70 mg by mouth every Monday. , Disp: , Rfl:  .  aspirin 81 MG tablet, Take 81 mg by mouth daily., Disp: , Rfl:  .  cholecalciferol (VITAMIN D) 1000 units tablet, Take 1 tablet (1,000 Units total) by mouth daily., Disp: , Rfl:  .  hydrocortisone 2.5 % cream, Apply 1 application topically as needed (for psoriasis). , Disp: , Rfl:  .  ketoconazole (NIZORAL) 2 % cream, Apply 1 application topically as needed (for  psoriasis). , Disp: , Rfl:  .  lamoTRIgine (LAMICTAL) 150 MG tablet, Take 1 tablet (150 mg total) by mouth 2 (two) times daily., Disp: 180 tablet, Rfl: 3 .  levETIRAcetam (KEPPRA) 500 MG tablet, TAKE (1/2) TABLET THREE TIMES DAILY., Disp: 135 tablet, Rfl: 3 .  memantine (NAMENDA) 10 MG tablet, TAKE 1 TABLET TWICE DAILY., Disp: 180 tablet, Rfl: 3 .  pramipexole (MIRAPEX) 0.25 MG tablet, TAKE 1 TABLET ONCE DAILY., Disp: 90 tablet, Rfl: 3 .  simvastatin (ZOCOR) 10 MG tablet, Take 10 mg by mouth daily. Reported on 06/27/2015, Disp: , Rfl:  .  tiotropium (SPIRIVA HANDIHALER) 18 MCG inhalation capsule, INHALE CONTENTS OF ONE CAPSULE (18 mcg) ONCE DAILY FOR COPD. (Patient taking differently: as needed. INHALE CONTENTS OF ONE CAPSULE (18 mcg) ONCE DAILY FOR COPD.), Disp: 30 capsule, Rfl: 5  Review of Systems     Objective:   Physical Exam  Constitutional: She is oriented to person, place, and time. She appears well-developed and well-nourished. No distress.  HENT:  Head: Normocephalic and  atraumatic.  Right Ear: External ear normal.  Left Ear: External ear normal.  Mouth/Throat: Oropharynx is clear and moist. No oropharyngeal exudate.  Eyes: Conjunctivae and EOM are normal. Pupils are equal, round, and reactive to light. Right eye exhibits no discharge. Left eye exhibits no discharge. No scleral icterus.  Neck: Normal range of motion. Neck supple. No JVD present. No tracheal deviation present. No thyromegaly present.  Cardiovascular: Normal rate, regular rhythm, normal heart sounds and intact distal pulses. Exam reveals no gallop and no friction rub.  No murmur heard. Pulmonary/Chest: Effort normal and breath sounds normal. No respiratory distress. She has no wheezes. She has no rales. She exhibits no tenderness.  Kyphosis +  Abdominal: Soft. Bowel sounds are normal. She exhibits no distension and no mass. There is no tenderness. There is no rebound and no guarding.  Musculoskeletal: Normal range of  motion. She exhibits no edema or tenderness.  Lymphadenopathy:    She has no cervical adenopathy.  Neurological: She is alert and oriented to person, place, and time. She has normal reflexes. No cranial nerve deficit. She exhibits normal muscle tone. Coordination normal.  Skin: Skin is warm and dry. No rash noted. She is not diaphoretic. No erythema. No pallor.  Psychiatric: She has a normal mood and affect. Her behavior is normal. Judgment and thought content normal.  Vitals reviewed.  Vitals:   03/04/17 1005  BP: (!) 142/82  Pulse: 85  SpO2: 97%  Weight: 146 lb 3.2 oz (66.3 kg)  Height: _0  (1.6 m)    Estimated body mass index is 25.9 kg/m as calculated from the following:   Height as of this encounter: _1  (1.6 m).   Weight as of this encounter: 146 lb 3.2 oz (66.3 kg).     Assessment:       ICD-10-CM   1. Panlobular emphysema ([place]) J43.1   2. Multiple lung nodules on CT R91.8        Plan:       Emphysema: doing well without spiriva. Continue to monitor  Lung nodules: improved/resolved. Expectant followup  followup 1 year or sooner if needed   Dr. Brand Males, M.D., [facility].C.P Pulmonary and Critical Care Medicine Staff Physician, [place] Director - Interstitial Lung Disease  Program  Pulmonary [place] at [place], [place], [address]  Pager: [phone], If no answer or between  15:00h - 7:00h: call [phone] Telephone: [phone]

## 2017-03-04 NOTE — Patient Instructions (Signed)
ICD-10-CM   1. Panlobular emphysema ([place]) J43.1   2. Multiple lung nodules on CT R91.8     Emphysema: doing well without spiriva. Continue to monitor  Lung nodules: improved/resolved. Expectant followup  followup 1 year or sooner if needed

## 2017-03-31 ENCOUNTER — Other Ambulatory Visit: Payer: Self-pay | Admitting: Adult Health

## 2017-03-31 ENCOUNTER — Other Ambulatory Visit: Payer: Self-pay | Admitting: Internal Medicine

## 2017-05-01 ENCOUNTER — Other Ambulatory Visit: Payer: Self-pay | Admitting: Neurology

## 2017-07-17 ENCOUNTER — Telehealth: Payer: Self-pay | Admitting: *Deleted

## 2017-07-17 NOTE — Telephone Encounter (Signed)
Called and spoke with Joellen Jersey at Dr. Drema Dallas office. Gave the appt date/time for April 26th at 10am

## 2017-08-01 ENCOUNTER — Inpatient Hospital Stay: Payer: Medicare Other | Attending: Gynecology | Admitting: Gynecology

## 2017-08-01 ENCOUNTER — Encounter: Payer: Self-pay | Admitting: Gynecology

## 2017-08-01 VITALS — BP 161/95 | HR 87 | Temp 97.7°F | Ht 64.0 in | Wt 146.6 lb

## 2017-08-01 DIAGNOSIS — D3910 Neoplasm of uncertain behavior of unspecified ovary: Secondary | ICD-10-CM | POA: Diagnosis not present

## 2017-08-01 DIAGNOSIS — Z87891 Personal history of nicotine dependence: Secondary | ICD-10-CM | POA: Diagnosis not present

## 2017-08-01 DIAGNOSIS — Z90722 Acquired absence of ovaries, bilateral: Secondary | ICD-10-CM | POA: Diagnosis not present

## 2017-08-01 DIAGNOSIS — Z90710 Acquired absence of both cervix and uterus: Secondary | ICD-10-CM | POA: Diagnosis not present

## 2017-08-01 DIAGNOSIS — R971 Elevated cancer antigen 125 [CA 125]: Secondary | ICD-10-CM | POA: Diagnosis not present

## 2017-08-01 NOTE — Patient Instructions (Signed)
No further CA 125 checks needed per Dr. Fermin Schwab.  No follow up necessary unless concerns or issues arise in the future.  Please call for any questions or concerns.

## 2017-08-01 NOTE — Progress Notes (Signed)
Consult Note: Gyn-Onc   [name] 84 y.o. female  Chief Complaint  Patient presents with  . Elevated CA-125    [name] 84 y.o. female     Chief Complaint  Patient presents with  . 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 I explained the patient  that there are a number of reasons that CA-125 may become elevated and do not necessarily represent a malignancy.  Given that the Ca1 25 value is stable over the past 3 years and that CT scans of the abdomen and pelvis in 2016 and 2017 showed no evidence of recurrent disease, I think it is extremely unlikely that she has recurrent disease nor that she will ever have a recurrence of her low malignant potential tumor.  Plan:  The patient return to her primary care physician and other physicians managing her other medical problems.  At this juncture I would strongly recommend discontinuing monitoring Ca1 25 values.   HPI: 84 year old white female seen in consultation at the request of Dr. Drema Dallas regarding management of a rising CA-125 value.  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 There was an implant on the small bowel which was resected which was entirely benign. Subsequently over the past 10 years, the patient has done well with no evidence recurrent disease. She had a CA-125 value in 2016 (when I initially saw her) which was 42 units per mL.  A CT scan following that visit showed no evidence of recurrent disease.  The patient subsequently had a CT scan of the abdomen and pelvis in 2017 showing no evidence of recurrent disease either.  Most recently she had a Ca1 25 value of 43 units/mL.  The patient denies any GI or GU symptoms except for some constipation that she manages with MiraLAX.[name] She specifically  denies any bloating or abdominal pain or pressure. She denies any recent infections. She also denies any acute medical problems.     Review of Systems:10 point review of systems is negative except as noted in interval history.   Vitals: Blood pressure (!) 161/95, pulse 87, temperature 97.7 F (36.5 C), temperature source Oral, height 5\' 4"  (1.626 m), weight 146 lb 9.8 oz (66.5 kg), last menstrual period 02/16/1975, SpO2 96 %.  Physical Exam: General : The patient is a healthy woman in no acute distress.  HEENT: normocephalic, extraoccular movements normal; neck is supple without thyromegally  Lynphnodes: Supraclavicular and inguinal nodes not enlarged  Abdomen: Soft, non-tender, no ascites, no organomegally, no masses, no hernias  Pelvic:  EGBUS: Normal female  Vagina: Normal, no lesions  Urethra and Bladder: Normal, non-tender  Cervix: Surgically absent  Uterus: Surgically absent  Bi-manual examination: Non-tender; no adenxal masses or nodularity  Rectal: normal sphincter tone, no masses, no blood  Lower extremities: No edema or varicosities. Normal range of motion      Allergies  Allergen Reactions  . Sulfa Antibiotics Nausea And Vomiting  . Tetracyclines & Related     Past Medical History:  Diagnosis Date  . Acute bronchitis 05/23/2011  . Acute upper respiratory infections of unspecified site 05/23/2011  . Arthritis   . Chronic airway obstruction, not elsewhere classified 05/23/2011  . Disturbance of salivary secretion 01/31/2011  . Dizziness and giddiness 01/31/2011  . Essential tremor  04/25/2014  . External hemorrhoids without mention of complication 94/85/4627  . Gait disorder 04/25/2014  . Insomnia, unspecified 09/12/2011  . Lumbago 01/31/2011  . Major depressive disorder, single episode, unspecified 01/31/2011  . Memory disorder 04/25/2014  . Mitral valve disorders(424.0) 01/31/2011  . Other and unspecified hyperlipidemia 01/31/2011  . Other convulsions 01/31/2011   . Other emphysema ([place]) 01/31/2011  . Pain in joint, site unspecified 01/31/2011  . Restless legs syndrome (RLS) 09/12/2011  . Retinal detachment with retinal defect of right eye 2011   right eye twice  . Senile osteoporosis 01/31/2011  . Spontaneous ecchymoses 01/31/2011  . Stiffness of joints, not elsewhere classified, multiple sites 01/31/2011  . Unspecified essential hypertension 01/31/2011    Past Surgical History:  Procedure Laterality Date  . ABDOMINAL HYSTERECTOMY  06/21/2003   TAH/BSO, omenectomy PSB resect, Stg IC cystadenofibroma  . CHOLECYSTECTOMY  2005   Dr. Marlou Starks  . ELBOW SURGERY Right 2008   broken   Dr. Apolonio Schneiders  . EYE SURGERY    . RETINAL DETACHMENT SURGERY N/A    two  . ROTATOR CUFF REPAIR Right 2012   Dr. Theda Sers  . SQUAMOUS CELL CARCINOMA EXCISION Bilateral 2012, 8/14   [place] on legs   Dr. Sarajane Jews  . TONSILLECTOMY  1941  . VIDEO BRONCHOSCOPY WITH ENDOBRONCHIAL NAVIGATION N/A 11/29/2015   Procedure: VIDEO BRONCHOSCOPY WITH ENDOBRONCHIAL NAVIGATION;  Surgeon: Collene Gobble, MD;  Location: [facility] OR;  Service: Thoracic;  Laterality: N/A;    Current Outpatient Medications  Medication Sig Dispense Refill  . alclomethasone (ACLOVATE) 0.05 % cream Apply 1 application topically 3 (three) times a week.     [name] aspirin 81 MG tablet Take 81 mg by mouth daily.    . cholecalciferol (VITAMIN D) 1000 units tablet Take 1 tablet (1,000 Units total) by mouth daily.    . hydrocortisone 2.5 % cream Apply 1 application topically as needed (for psoriasis).     [name] ketoconazole (NIZORAL) 2 % cream Apply 1 application topically as needed (for psoriasis).     [name] lamoTRIgine (LAMICTAL) 150 MG tablet Take 1 tablet (150 mg total) by mouth 2 (two) times daily. 180 tablet 3  . levETIRAcetam (KEPPRA) 500 MG tablet TAKE (1/2) TABLET THREE TIMES DAILY. 135 tablet 3  . memantine (NAMENDA) 10 MG tablet TAKE 1 TABLET BY MOUTH TWICE DAILY. 180 tablet 3  . nystatin (MYCOSTATIN) 100000 UNIT/ML suspension      . pramipexole (MIRAPEX) 0.25 MG tablet TAKE 1 TABLET ONCE DAILY. 90 tablet 3  . PROAIR HFA 108 (90 Base) MCG/ACT inhaler 2 PUFFS INTO THE LUNGS EVERY 4 HOURS IF NEEDED FOR WHEEZING/SHORTNESSOF BREATH. 8.5 g 0  . simvastatin (ZOCOR) 10 MG tablet Take 10 mg by mouth daily. Reported on 06/27/2015    . tiotropium (SPIRIVA HANDIHALER) 18 MCG inhalation capsule INHALE CONTENTS OF ONE CAPSULE ONCE DAILY FOR COPD. 30 capsule 0   No current facility-administered medications for this visit.     Social History   Socioeconomic History  . Marital status: Divorced    Spouse name: Not on file  . Number of children: 0  . Years of education: Not on file  . Highest education level: Not on file  Occupational History  . Occupation: retired Tourist information centre manager for [employer]  Social Needs  . Financial resource strain: Not on file  . Food insecurity:    Worry: Not on file    Inability: Not on file  . Transportation needs:    Medical: Not on file  Non-medical: Not on file  Tobacco Use  . Smoking status: Former Smoker    Packs/day: 1.00    Years: 40.00    Pack years: 40.00    Types: Cigarettes    Last attempt to quit: 04/08/1988    Years since quitting: 29.3  . Smokeless tobacco: Never Used  . Tobacco comment: quit in 1990  Substance and Sexual Activity  . Alcohol use: No    Alcohol/week: 0.0 oz  . Drug use: No  . Sexual activity: Never  Lifestyle  . Physical activity:    Days per week: Not on file    Minutes per session: Not on file  . Stress: Not on file  Relationships  . Social connections:    Talks on phone: Not on file    Gets together: Not on file    Attends religious service: Not on file    Active member of club or organization: Not on file    Attends meetings of clubs or organizations: Not on file    Relationship status: Not on file  . Intimate partner violence:    Fear of current or ex partner: Not on file    Emotionally abused: Not on file    Physically abused: Not on file     Forced sexual activity: Not on file  Other Topics Concern  . Not on file  Social History Narrative   L[facility] at [facility]   No children   Divorced   Exercise climbs steps 4 flights daily PT for baalance   Alcohol none   Stopped smoking 1989   Patient drinks 3 large sodas daily.   Patient is right handed.    Family History  Problem Relation Age of Onset  . Heart disease Father        CHF  . Cancer Mother        breast  . Seizures Sister       Marti Sleigh, MD 08/01/2017, 10:32 AM

## 2017-10-02 ENCOUNTER — Ambulatory Visit: Payer: Medicare Other | Admitting: Internal Medicine

## 2017-10-02 ENCOUNTER — Encounter: Payer: Self-pay | Admitting: Internal Medicine

## 2017-10-02 VITALS — BP 118/78 | HR 88 | Ht 64.0 in | Wt 151.2 lb

## 2017-10-02 DIAGNOSIS — R0602 Shortness of breath: Secondary | ICD-10-CM

## 2017-10-02 DIAGNOSIS — J439 Emphysema, unspecified: Secondary | ICD-10-CM | POA: Diagnosis not present

## 2017-10-02 DIAGNOSIS — M40204 Unspecified kyphosis, thoracic region: Secondary | ICD-10-CM | POA: Diagnosis not present

## 2017-10-02 MED ORDER — TIOTROPIUM BROMIDE MONOHYDRATE 18 MCG IN CAPS: ORAL_CAPSULE | RESPIRATORY_TRACT | 5 refills | Status: DC

## 2017-10-02 NOTE — Addendum Note (Signed)
Addended by: Lorretta Harp on: 10/02/2017 11:58 AM   Modules accepted: Orders

## 2017-10-02 NOTE — Progress Notes (Addendum)
Subjective:     Patient ID: [name], female   DOB: 24-Oct-1932, 84 y.o.   MRN: [id number]  HPI   OV 10/30/2015  Chief Complaint  Patient presents with  . Follow-up    Pt c/o occasional dry cough. Pt denies SOB/wheeze/CP/tightness. Pt states that as long as she uses her inhalers she does very well.    84 year old female follow-up for multiple issues  #Emphysema isolated low DLCO and CT evidence without out of proportion shortness of breath in setting of normal cardiac stress test and echocardiogram: At last visit 6 months ago she felt that pulmonary rehabilitation would not be that useful and would prefer to do it as a physical therapy in her friend's home. She did not do the physical therapy up until 3 weeks ago when she is doing it more for balance issues. Nevertheless she states dyspnea stable. I discovered today that she is only taking Spiriva scheduled. She is only taking the Brio as needed which she is taking 3 times a month. She does not want to do albuterol for rescue but instead prefers the [facility] for needing to "feel safe"  #Multiple lung nodules: Last CT chest without contrast was December 2016. Nodules were stable one of them had resolved and one of them had diminished. Recommendation was to do CT chest December 2017. But in July 2017. Abdominal CT and this shows an enlarging left lower lobe pulmonary nodule subpleural groundglass opacity. This was compared to March 2016. She is concerned about this. She does not have hemoptysis or weight loss. I personally visualized the CT chest and agree    OV 11/10/2015  Chief Complaint  Patient presents with  . Follow-up    Pt here after PET scan. Pt denies breathing changes since last OV. Pt denies any new complaints at this time.    Presents with all her sisters to discuss results of Ct chest abnd PET scan  # LUNG NODULE CT chest 11/03/15: personally visualised and PET scan 11/08/15 1. LUL nodule is 1.3cm  - very slowly enlarging  over time - LOW uptake 2. LLL nodule - 1.8cm - INTERMEDIATE UPDTAKE  Given age, smoking hx and size and slow grwoth - likely we are dealing with BAC  #New issue  - PET shows high uptake in right side of thyroid   OV 01/15/2016  Chief Complaint  Patient presents with  . Hospitalization Follow-up    Per Dr. Lamonte Sakai, Pt. states she had a bronchoscopy, Pt. states her breathing is doing ok, Pt c/o of sob, some wheezing, Pt. denies chest pain and chest tightness     FU ENB results and thyroiud bx results  Lung nodule: She is her by herself. Middle Sister Inez Catalina not here. Derryl Harbor not here. Bx by DR Bryum non-diagnostic. PErcepta research registry test is indeterminate. Given she is 20 she wants very conservative Rx  Thyroid bx: folloicular atypica. She says on internet it says 5% chance of cancer. SHe does not want thyroidectomy and questions need for endocrine referrral.   OVerall well. Dyspnea stable. Saw Dr Marlou Porch cardiac stress normal. Is uptodate with high dose flu shot  OV 09/05/2016  Chief Complaint  Patient presents with  . Follow-up    Pt here for 6 month f/u, pt was to have a CT chest but this was not done. Pt denies changes since last OV. Pt states she is feeling well. Pt deines cough, CP/tightness.     Fu lung nodule: indeterminate. Suspect BAC if  is cancer. Last CT July 2017. Was supposed to have fu CT but held off due to goals of care. Wants to postpone. Feels well from resp standpoint.   Emphysema: doing well on spiriva   OV 03/04/2017  Chief Complaint  Patient presents with  . Follow-up    CT 03/03/17.  Pt states that she has been doing good since last visit. Denies any complaints of SOB, cough, or CP.     Follow-up lung nodules indeterminate left upper lobe and left lower lobe: She opted for expectant follow-up.  She had her CT scan at the 4-monthstandpoint since July 2017.  The results are detailed below.  I personally visualized these results and agree  with the findings.  She does not want anymore follow-up of the nodules  Emphysema: Hardly any symptoms.  She only takes Spiriva as needed.  She is up-to-date with the flu shot.  She prefers to keep things as this  Ct Chest Wo Contrast  Result Date: 03/03/2017 CLINICAL DATA:  Followup pulmonary nodules. EXAM: CT CHEST WITHOUT CONTRAST TECHNIQUE: Multidetector CT imaging of the chest was performed following the standard protocol without IV contrast. COMPARISON:  PET-CT 11/08/2015 and chest CT 10/26/2015 FINDINGS: Cardiovascular: The heart is normal in size and stable. No pericardial effusion. Stable tortuosity, ectasia and calcification of the thoracic aorta. Stable coronary artery calcifications. Mediastinum/Nodes: No mediastinal or hilar mass or adenopathy. Small scattered lymph nodes are stable. The esophagus is grossly normal. Lungs/Pleura: Stable advanced emphysematous changes and pulmonary scarring. Stable patchy peripheral tree-in-bud pattern, likely chronic inflammation or atypical infection such as MAC. Overall improved when compared to the prior study. Stable nodular density along the right major fissure on image number 82, likely subpleural scarring changes. The nodular opacity seen near the left hilum in the left upper lobe has significantly improved. There is some residual old nodular scarring changes but no discrete lesion. This is most likely postinflammatory scarring. No new/acute overlying pulmonary process. No new worrisome pulmonary lesions. No pleural effusion. Upper Abdomen: No significant upper abdominal findings. Stable hepatic hemangiomas. Stable advanced atherosclerotic calcifications involving the aorta and branch vessels. Musculoskeletal: No breast masses, supraclavicular or axillary lymphadenopathy. Stable severe scoliosis and degenerative thoracic and lumbar spondylosis and remote compression fractures. IMPRESSION: 1. Stable advanced emphysematous changes and areas of pulmonary  scarring. 2. Slightly improved patchy peripheral tree-in-bud pattern, most likely chronic inflammation or atypical infection. 3. Left upper lobe nodular process has improved and is likely postinflammatory scarring changes. No discrete lesion. 4. Stable nodular density near the right major fissure since 2016, considered benign. 5. No acute pulmonary findings. Aortic Atherosclerosis (ICD10-I70.0) and Emphysema (ICD10-J43.9). Electronically Signed   By: PMarijo SanesM.D.   On: 03/03/2017 12:57    OV 10/02/2017 Chief Complaint  Patient presents with  . Follow-up    Pt states she has been doing okay since last visit. States brething has become slightly worse. Denies any complaints of cough or CP.   Follow-up emphysema with stable/resolving pulmonary nodules:she does say that since I last saw her in November 2018 she is having more difficulty with shortness of breath. It seems this is partly exertional but also partly positional because of the severe kyphosis. She says she is losing height because of osteoporosis and vertebral compression issues. Is also significant fatigue. While looking at her Score cough is not a major issue and dyspnea is as mild/moderate but what is more important is fatigue and sleep quality with seems to be the more significant  issues. In the past she tried Spiriva that seemed to help her but she thought she could manage without it and last time we agreed we would stop it. This no chest pain orthopnea paroxysmal nocturnal dyspnea or wheezing or sputum production or worsening pedal edema. She is known to have some mild associated ankle edema with varicose veins and sausage toes    CAT COPD Symptom & Quality of Life Score ([place]) 0 is no burden. 5 is highest burden 10/02/2017   Never Cough -> Cough all the time 1  No phlegm in chest -> Chest is full of phlegm 1  No chest tightness -> Chest feels very tight 2  No dyspnea for 1 flight stairs/hill -> Very dyspneic for 1 flight  of stairs 2  No limitations for ADL at home -> Very limited with ADL at home 3  Confident leaving home -> Not at all confident leaving home 1  Sleep soundly -> Do not sleep soundly because of lung condition 4  Lots of Energy -> No energy at all 4  TOTAL Score (max 40)  18        has a past medical history of Acute bronchitis (05/23/2011), Acute upper respiratory infections of unspecified site (05/23/2011), Arthritis, Chronic airway obstruction, not elsewhere classified (05/23/2011), Disturbance of salivary secretion (01/31/2011), Dizziness and giddiness (01/31/2011), Essential tremor (04/25/2014), External hemorrhoids without mention of complication (17/51/0258), Gait disorder (04/25/2014), Insomnia, unspecified (09/12/2011), Lumbago (01/31/2011), Major depressive disorder, single episode, unspecified (01/31/2011), Memory disorder (04/25/2014), Mitral valve disorders(424.0) (01/31/2011), Other and unspecified hyperlipidemia (01/31/2011), Other convulsions (01/31/2011), Other emphysema ([place]) (01/31/2011), Pain in joint, site unspecified (01/31/2011), Restless legs syndrome (RLS) (09/12/2011), Retinal detachment with retinal defect of right eye (2011), Senile osteoporosis (01/31/2011), Spontaneous ecchymoses (01/31/2011), Stiffness of joints, not elsewhere classified, multiple sites (01/31/2011), and Unspecified essential hypertension (01/31/2011).   reports that she quit smoking about 29 years ago. Her smoking use included cigarettes. She has a 40.00 pack-year smoking history. She has never used smokeless tobacco.  Past Surgical History:  Procedure Laterality Date  . ABDOMINAL HYSTERECTOMY  06/21/2003   TAH/BSO, omenectomy PSB resect, Stg IC cystadenofibroma  . CHOLECYSTECTOMY  2005   Dr. Marlou Starks  . ELBOW SURGERY Right 2008   broken   Dr. Apolonio Schneiders  . EYE SURGERY    . RETINAL DETACHMENT SURGERY N/A    two  . ROTATOR CUFF REPAIR Right 2012   Dr. Theda Sers  . SQUAMOUS CELL CARCINOMA EXCISION Bilateral 2012,  8/14   [place] on legs   Dr. Sarajane Jews  . TONSILLECTOMY  1941  . VIDEO BRONCHOSCOPY WITH ENDOBRONCHIAL NAVIGATION N/A 11/29/2015   Procedure: VIDEO BRONCHOSCOPY WITH ENDOBRONCHIAL NAVIGATION;  Surgeon: Collene Gobble, MD;  Location: [facility] OR;  Service: Thoracic;  Laterality: N/A;    Allergies  Allergen Reactions  . Sulfa Antibiotics Nausea And Vomiting  . Tetracyclines & Related     Immunization History  Administered Date(s) Administered  . Influenza Split 01/06/2014  . Influenza Whole 01/07/2012, 01/06/2013  . Influenza, High Dose Seasonal PF 12/25/2015, 01/20/2017  . Influenza,inj,Quad PF,6+ Mos 12/21/2014  . Pneumococcal Conjugate-13 02/01/2014  . Pneumococcal Polysaccharide-23 04/08/2004  . Td 04/08/2002  . Tdap 04/09/2011  . Zoster 04/08/2008    Family History  Problem Relation Age of Onset  . Heart disease Father        CHF  . Cancer Mother        breast  . Seizures Sister      Current Outpatient Medications:  .  alclomethasone (ACLOVATE) 0.05 % cream, Apply 1 application topically 3 (three) times a week. , Disp: , Rfl:  .  aspirin 81 MG tablet, Take 81 mg by mouth daily., Disp: , Rfl:  .  cholecalciferol (VITAMIN D) 1000 units tablet, Take 1 tablet (1,000 Units total) by mouth daily., Disp: , Rfl:  .  hydrocortisone 2.5 % cream, Apply 1 application topically as needed (for psoriasis). , Disp: , Rfl:  .  ketoconazole (NIZORAL) 2 % cream, Apply 1 application topically as needed (for psoriasis). , Disp: , Rfl:  .  lamoTRIgine (LAMICTAL) 150 MG tablet, Take 1 tablet (150 mg total) by mouth 2 (two) times daily., Disp: 180 tablet, Rfl: 3 .  levETIRAcetam (KEPPRA) 500 MG tablet, TAKE (1/2) TABLET THREE TIMES DAILY., Disp: 135 tablet, Rfl: 3 .  memantine (NAMENDA) 10 MG tablet, TAKE 1 TABLET BY MOUTH TWICE DAILY., Disp: 180 tablet, Rfl: 3 .  nystatin (MYCOSTATIN) 100000 UNIT/ML suspension, , Disp: , Rfl:  .  pramipexole (MIRAPEX) 0.25 MG tablet, TAKE 1 TABLET ONCE DAILY., Disp:  90 tablet, Rfl: 3 .  PROAIR HFA 108 (90 Base) MCG/ACT inhaler, 2 PUFFS INTO THE LUNGS EVERY 4 HOURS IF NEEDED FOR WHEEZING/SHORTNESSOF BREATH., Disp: 8.5 g, Rfl: 0 .  simvastatin (ZOCOR) 10 MG tablet, Take 10 mg by mouth daily. Reported on 06/27/2015, Disp: , Rfl:  .  tiotropium (SPIRIVA HANDIHALER) 18 MCG inhalation capsule, INHALE CONTENTS OF ONE CAPSULE ONCE DAILY FOR COPD., Disp: 30 capsule, Rfl: 0    Review of Systems     Objective:   Physical Exam Vitals:   10/02/17 1105  BP: 118/78  Pulse: 88  SpO2: 94%  Weight: 151 lb 3.2 oz (68.6 kg)  Height: _0  (1.626 m)    Estimated body mass index is 25.95 kg/m as calculated from the following:   Height as of this encounter: _1  (1.626 m).   Weight as of this encounter: 151 lb 3.2 oz (68.6 kg).    General Appearance:    Pleasant  Head:    Normocephalic, without obvious abnormality, atraumatic  Eyes:    PERRL - yes, conjunctiva/corneas - clear      Ears:    Normal external ear canals, both ears  Nose:   NG tube - no  Throat:  ETT TUBE - no , OG tube - no  Neck:   Supple,  No enlargement/tenderness/nodules     Lungs:     Clear to auscultation bilaterally,   Chest wall:    Extreme kyphossi  Heart:    S1 and S2 normal, no murmur, CVP - no.  Pressors - no  Abdomen:     Soft, no masses, no organomegaly  Genitalia:    Not done  Rectal:   not done  Extremities:   Extremities- varocise + , mild ankle edema +, sausage toes +     Skin:   Intact in exposed areas     Neurologic:   Sedation - none -> RASS - +1 . Moves all 4s - yes. CAM-ICU - neg . Orientation - x 3. Uses walker         Assessment:       ICD-10-CM   1. Shortness of breath R06.02   2. Pulmonary emphysema, unspecified emphysema type ([place]) J43.9   3. Kyphosis of thoracic region, unspecified kyphosis type M40.204    Dyspnea deteriorated    Plan:      Shortness of breath is multifactorial  I suspect associated emphysema and  loss of height because of  spinal issues is contributing   In addition physical deconditioning/fatigue can also be contributing  On exam I do not see a cardiac issue for shortness of breath at this point  Plan - simplest approach is to start Spiriva inhaler respimat format - that seemed to help you in the past - I do not see a need to do another CT scan of the chest particularly because most recent one was in November 2018 - . Definitely need to continue to use a lift chair to sleep  Follow-up  - 3 months or sooner if needed; can consider overnight oxygen testing at the time of follow-up if symptoms are not improved   Dr. Brand Males, M.D., [facility].C.P Pulmonary and Critical Care Medicine Staff Physician, [place] Director - Interstitial Lung Disease  Program  [facility] [place], [place], [address]  Pager: [phone], If no answer or between  15:00h - 7:00h: call [phone] Telephone: [phone]   1

## 2017-10-02 NOTE — Patient Instructions (Signed)
ICD-10-CM   1. Shortness of breath R06.02   2. Pulmonary emphysema, unspecified emphysema type ([place]) J43.9   3. Kyphosis of thoracic region, unspecified kyphosis type M40.204     Shortness of breath is multifactorial  I suspect associated emphysema and loss of height because of spinal issues is contributing   In addition physical deconditioning/fatigue can also be contributing  On exam I do not see a cardiac issue for shortness of breath at this point  Plan - simplest approach is to start Spiriva inhaler respimat format - that seemed to help you in the past - I do not see a need to do another CT scan of the chest particularly because most recent one was in November 2018 - . Definitely need to continue to use a lift chair to sleep  Follow-up  - 3 months or sooner if needed; can consider overnight oxygen testing at the time of follow-up if symptoms are not improved

## 2018-01-05 ENCOUNTER — Ambulatory Visit: Payer: Medicare Other | Admitting: Internal Medicine

## 2018-01-28 ENCOUNTER — Ambulatory Visit: Payer: Medicare Other | Admitting: Neurology

## 2018-02-04 ENCOUNTER — Other Ambulatory Visit: Payer: Self-pay | Admitting: Neurology

## 2018-02-25 ENCOUNTER — Other Ambulatory Visit: Payer: Self-pay | Admitting: Neurology

## 2018-03-02 ENCOUNTER — Encounter: Payer: Self-pay | Admitting: Internal Medicine

## 2018-04-27 ENCOUNTER — Other Ambulatory Visit: Payer: Self-pay | Admitting: Neurology

## 2018-05-20 ENCOUNTER — Other Ambulatory Visit: Payer: Self-pay | Admitting: Neurology

## 2018-05-20 ENCOUNTER — Ambulatory Visit: Payer: Medicare Other | Admitting: Neurology

## 2018-05-28 ENCOUNTER — Other Ambulatory Visit: Payer: Self-pay | Admitting: Neurology

## 2018-06-02 ENCOUNTER — Other Ambulatory Visit: Payer: Self-pay | Admitting: Neurology

## 2018-06-04 ENCOUNTER — Ambulatory Visit: Payer: Medicare Other | Admitting: Neurology

## 2018-06-15 NOTE — Progress Notes (Addendum)
PATIENT: [name] DOB: 03-21-1933  REASON FOR VISIT: follow up HISTORY FROM: patient  HISTORY OF PRESENT ILLNESS: Today 06/16/18  Ms. Aaberg is a 85 year old female who presents for follow-up with history of seizures, mild memory disturbance.  She is currently taking Keppra and Lamictal for seizures.  She is currently taking Namenda and her last MMSE was 30/30.  She reports overall she is doing quite well.  She reports her last seizure was years ago.  She is tolerating her medications without problem.  She lives at [facility] independent living.  She does not drive a car.  She does some light housework, light cooking but eats most meals in the dining hall.  She does manage her finances.  She is usually very active, walking 4 miles a day with a walker.  She recently had minor surgery to her right leg and is recovering from that.  She denies any falls but reports her balance has been off.  She reports she is very careful not to fall as she was told her bone density was low.  She reports her memory may be a little worse however she attributes this to being 64.  She is generally a very active lady, does activities, spends time with friends.  She does report restless legs at night and she will take Mirapex at bedtime with good benefit.  She denies any new problems or concerns today and reports overall she is doing quite well.   HISTORY 01/27/2017 Dr. Jannifer Franklin  Ms. Fabio is an 85 year old right-handed white female with a history of seizures that have been well-controlled on Keppra and Lamictal. The patient has not been operating a motor vehicle. She reports some troubles with gait instability, she uses a cane for ambulation, she reports no recent falls. She also has restless leg syndrome that is easily controlled with low-dose Mirapex. She has gone on a diet and she has lost 20 pounds since last seen, she walks 3-4 miles a day. She reports a mild memory disturbance, this has not worsened much over  time, she has not altered activities of daily living because of memory. She is on Namenda and continues on this medication. She returns to this office for further evaluation.   REVIEW OF SYSTEMS: Out of a complete 14 system review of symptoms, the patient complains only of the following symptoms, and all other reviewed systems are negative.  Activity change, shortness of breath, leg swelling, swollen abdomen, constipation, restless leg, insomnia, frequent waking, joint pain, back pain, aching muscles, walking difficulty, memory loss, seizure  ALLERGIES: Allergies  Allergen Reactions  . Latex Other (See Comments)    Swelling  . Sulfa Antibiotics Nausea And Vomiting  . Tetracyclines & Related     HOME MEDICATIONS: Outpatient Medications Prior to Visit  Medication Sig Dispense Refill  . alclomethasone (ACLOVATE) 0.05 % cream Apply 1 application topically 3 (three) times a week.     [name] aspirin 81 MG tablet Take 81 mg by mouth daily.    . cholecalciferol (VITAMIN D) 1000 units tablet Take 1 tablet (1,000 Units total) by mouth daily.    . hydrocortisone 2.5 % cream Apply 1 application topically as needed (for psoriasis).     [name] ketoconazole (NIZORAL) 2 % cream Apply 1 application topically as needed (for psoriasis).     [name] lamoTRIgine (LAMICTAL) 150 MG tablet TAKE 1 TABLET BY MOUTH TWICE DAILY. 180 tablet 1  . levETIRAcetam (KEPPRA) 500 MG tablet TAKE 1/2 TABLET IN THE  AM AND 1 TABLET AT BEDTIME. 135 tablet 0  . memantine (NAMENDA) 10 MG tablet TAKE 1 TABLET BY MOUTH TWICE DAILY. 60 tablet 0  . nystatin (MYCOSTATIN) 100000 UNIT/ML suspension     . PROAIR HFA 108 (90 Base) MCG/ACT inhaler 2 PUFFS INTO THE LUNGS EVERY 4 HOURS IF NEEDED FOR WHEEZING/SHORTNESSOF BREATH. 8.5 g 0  . simvastatin (ZOCOR) 10 MG tablet Take 10 mg by mouth daily. Reported on 06/27/2015    . tiotropium (SPIRIVA HANDIHALER) 18 MCG inhalation capsule INHALE CONTENTS OF ONE CAPSULE ONCE DAILY FOR COPD. 30 capsule 5  .  pramipexole (MIRAPEX) 0.25 MG tablet TAKE 1 TABLET ONCE DAILY. 30 tablet 0   No facility-administered medications prior to visit.     PAST MEDICAL HISTORY: Past Medical History:  Diagnosis Date  . Acute bronchitis 05/23/2011  . Acute upper respiratory infections of unspecified site 05/23/2011  . Arthritis   . Chronic airway obstruction, not elsewhere classified 05/23/2011  . Disturbance of salivary secretion 01/31/2011  . Dizziness and giddiness 01/31/2011  . Essential tremor 04/25/2014  . External hemorrhoids without mention of complication 10/02/9483  . Gait disorder 04/25/2014  . Insomnia, unspecified 09/12/2011  . Lumbago 01/31/2011  . Major depressive disorder, single episode, unspecified 01/31/2011  . Memory disorder 04/25/2014  . Mitral valve disorders(424.0) 01/31/2011  . Other and unspecified hyperlipidemia 01/31/2011  . Other convulsions 01/31/2011  . Other emphysema ([place]) 01/31/2011  . Pain in joint, site unspecified 01/31/2011  . Restless legs syndrome (RLS) 09/12/2011  . Retinal detachment with retinal defect of right eye 2011   right eye twice  . Senile osteoporosis 01/31/2011  . Spontaneous ecchymoses 01/31/2011  . Stiffness of joints, not elsewhere classified, multiple sites 01/31/2011  . Unspecified essential hypertension 01/31/2011    PAST SURGICAL HISTORY: Past Surgical History:  Procedure Laterality Date  . ABDOMINAL HYSTERECTOMY  06/21/2003   TAH/BSO, omenectomy PSB resect, Stg IC cystadenofibroma  . CHOLECYSTECTOMY  2005   Dr. Marlou Starks  . ELBOW SURGERY Right 2008   broken   Dr. Apolonio Schneiders  . EYE SURGERY    . RETINAL DETACHMENT SURGERY N/A    two  . ROTATOR CUFF REPAIR Right 2012   Dr. Theda Sers  . SQUAMOUS CELL CARCINOMA EXCISION Bilateral 2012, 8/14   [place] on legs   Dr. Sarajane Jews  . TONSILLECTOMY  1941  . VIDEO BRONCHOSCOPY WITH ENDOBRONCHIAL NAVIGATION N/A 11/29/2015   Procedure: VIDEO BRONCHOSCOPY WITH ENDOBRONCHIAL NAVIGATION;  Surgeon: Collene Gobble, MD;   Location: [facility] OR;  Service: Thoracic;  Laterality: N/A;    FAMILY HISTORY: Family History  Problem Relation Age of Onset  . Heart disease Father        CHF  . Cancer Mother        breast  . Seizures Sister     SOCIAL HISTORY: Social History   Socioeconomic History  . Marital status: Divorced    Spouse name: Not on file  . Number of children: 0  . Years of education: Not on file  . Highest education level: Not on file  Occupational History  . Occupation: retired Tourist information centre manager for [employer]  Social Needs  . Financial resource strain: Not on file  . Food insecurity:    Worry: Not on file    Inability: Not on file  . Transportation needs:    Medical: Not on file    Non-medical: Not on file  Tobacco Use  . Smoking status: Former Smoker    Packs/day: 1.00  Years: 40.00    Pack years: 40.00    Types: Cigarettes    Last attempt to quit: 04/08/1988    Years since quitting: 30.2  . Smokeless tobacco: Never Used  . Tobacco comment: quit in 1990  Substance and Sexual Activity  . Alcohol use: No    Alcohol/week: 0.0 standard drinks  . Drug use: No  . Sexual activity: Never  Lifestyle  . Physical activity:    Days per week: Not on file    Minutes per session: Not on file  . Stress: Not on file  Relationships  . Social connections:    Talks on phone: Not on file    Gets together: Not on file    Attends religious service: Not on file    Active member of club or organization: Not on file    Attends meetings of clubs or organizations: Not on file    Relationship status: Not on file  . Intimate partner violence:    Fear of current or ex partner: Not on file    Emotionally abused: Not on file    Physically abused: Not on file    Forced sexual activity: Not on file  Other Topics Concern  . Not on file  Social History Narrative   Lives at [facility]   No children   Divorced   Exercise climbs steps 4 flights daily PT for [employer]   Alcohol none   Stopped  smoking 1989   Patient drinks 3 large sodas daily.   Patient is right handed.      PHYSICAL EXAM  Vitals:   06/16/18 1019  BP: (!) 145/91  Pulse: 89  Weight: 162 lb (73.5 kg)  Height: 5\' 4"  (1.626 m)   Body mass index is 27.81 kg/m.  Generalized: Well developed, in no acute distress  MMSE - Mini Mental State Exam 06/16/2018 01/26/2016 04/28/2015  Not completed: (No Data) - -  Orientation to time 5 5 5   Orientation to Place 5 5 5   Registration 3 3 3   Attention/ Calculation 5 5 5   Recall 3 3 3   Language- name 2 objects 2 2 2   Language- repeat 1 1 1   Language- follow 3 step command 3 3 2   Language- read & follow direction 1 1 1   Write a sentence 1 1 1   Copy design 1 1 1   Total score 30 30 29    Neurological examination  Mentation: Alert oriented to time, place, history taking. Follows all commands speech and language fluent Cranial nerve II-XII: Pupils were equal round reactive to light. Extraocular movements were full, visual field were full on confrontational test. Facial sensation and strength were normal. Uvula tongue midline. Head turning and shoulder shrug  were normal and symmetric. Motor: The motor testing reveals 5 over 5 strength of all 4 extremities. Good symmetric motor tone is noted throughout.  Sensory: Sensory testing is intact to soft touch on all 4 extremities.  Decreased vibration sensation to bilateral lower extremities no evidence of extinction is noted.  Coordination: Cerebellar testing reveals good finger-nose-finger and heel-to-shin bilaterally.  Gait and station: Gait is mildly unsteady, using a cane.  Tandem gait not attempted. Romberg is negative. No drift is seen.  Reflexes: Deep tendon reflexes are symmetric and normal bilaterally.   DIAGNOSTIC DATA (LABS, IMAGING, TESTING) - I reviewed patient records, labs, notes, testing and imaging myself where available.  Lab Results  Component Value Date   WBC 5.5 01/27/2017   HGB 14.9 01/27/2017  HCT  46.3 01/27/2017   MCV 95 01/27/2017   PLT 218 01/27/2017      Component Value Date/Time   NA 143 01/27/2017 1055   NA 143 06/22/2014 1034   K 4.8 01/27/2017 1055   K 5.0 06/22/2014 1034   CL 102 01/27/2017 1055   CO2 23 01/27/2017 1055   CO2 28 06/22/2014 1034   GLUCOSE 75 01/27/2017 1055   GLUCOSE 78 11/27/2015 1325   GLUCOSE 82 06/22/2014 1034   BUN 18 01/27/2017 1055   BUN 29.6 (H) 06/22/2014 1034   CREATININE 0.87 01/27/2017 1055   CREATININE 1.02 (H) 06/27/2015 1055   CREATININE 0.8 06/22/2014 1034   CALCIUM 9.7 01/27/2017 1055   CALCIUM 9.7 06/22/2014 1034   PROT 7.2 01/27/2017 1055   ALBUMIN 4.6 01/27/2017 1055   AST 21 01/27/2017 1055   ALT 15 01/27/2017 1055   ALKPHOS 107 01/27/2017 1055   BILITOT 0.5 01/27/2017 1055   GFRNONAA 63 01/27/2017 1055   GFRAA 72 01/27/2017 1055   Lab Results  Component Value Date   CHOL 249 (H) 06/27/2015   HDL 70 06/27/2015   LDLCALC 160 (H) 06/27/2015   TRIG 97 06/27/2015   CHOLHDL 3.6 06/27/2015   No results found for: HGBA1C Lab Results  Component Value Date   VITAMINB12 425 04/25/2014   Lab Results  Component Value Date   TSH 1.51 02/28/2016      ASSESSMENT AND PLAN 85 y.o. year old female  has a past medical history of Acute bronchitis (05/23/2011), Acute upper respiratory infections of unspecified site (05/23/2011), Arthritis, Chronic airway obstruction, not elsewhere classified (05/23/2011), Disturbance of salivary secretion (01/31/2011), Dizziness and giddiness (01/31/2011), Essential tremor (04/25/2014), External hemorrhoids without mention of complication (07/68/0881), Gait disorder (04/25/2014), Insomnia, unspecified (09/12/2011), Lumbago (01/31/2011), Major depressive disorder, single episode, unspecified (01/31/2011), Memory disorder (04/25/2014), Mitral valve disorders(424.0) (01/31/2011), Other and unspecified hyperlipidemia (01/31/2011), Other convulsions (01/31/2011), Other emphysema ([place]) (01/31/2011), Pain in  joint, site unspecified (01/31/2011), Restless legs syndrome (RLS) (09/12/2011), Retinal detachment with retinal defect of right eye (2011), Senile osteoporosis (01/31/2011), Spontaneous ecchymoses (01/31/2011), Stiffness of joints, not elsewhere classified, multiple sites (01/31/2011), and Unspecified essential hypertension (01/31/2011). here with:  1.  Mild memory disturbance 2.  Seizures 3.  Restless leg syndrome  I had a very pleasant visit and conversation with Ms. Macknight. Overall she is doing quite well.  She will continue taking Lamictal 150 mg tablet twice daily, Keppra 500 mg tablet take half a tablet in the morning and 1 tablet at bedtime.  She denies any seizure events.  She is tolerating the medication well. She will continue taking Mirapex at bedtime, prescription was sent in.  I will check blood work today.  Her memory score is unchanged, 30/30.  She will continue taking Namenda 10 mg twice daily.  She will continue to monitor her balance and be careful not to fall.  She has had physical therapy in the past and we discussed possibly trying this in the future if needed. She will follow-up in 1 year or sooner if needed.  I advised that if her symptoms worsen or if she develops any new symptoms she should let us know.     Butler Denmark, AGNP-C, DNP 06/16/2018, 11:06 AM [name] [address], [place], [place] [address] [phone]

## 2018-06-16 ENCOUNTER — Encounter: Payer: Self-pay | Admitting: Neurology

## 2018-06-16 ENCOUNTER — Ambulatory Visit: Payer: Medicare Other | Admitting: Neurology

## 2018-06-16 VITALS — BP 145/91 | HR 89 | Ht 64.0 in | Wt 162.0 lb

## 2018-06-16 DIAGNOSIS — G40909 Epilepsy, unspecified, not intractable, without status epilepticus: Secondary | ICD-10-CM | POA: Diagnosis not present

## 2018-06-16 DIAGNOSIS — G2581 Restless legs syndrome: Secondary | ICD-10-CM | POA: Diagnosis not present

## 2018-06-16 DIAGNOSIS — R413 Other amnesia: Secondary | ICD-10-CM

## 2018-06-16 MED ORDER — PRAMIPEXOLE DIHYDROCHLORIDE 0.25 MG PO TABS: 0.2500 mg | ORAL_TABLET | Freq: Every day | ORAL | 3 refills | Status: DC

## 2018-06-16 NOTE — Progress Notes (Signed)
I have read the note, and I agree with the clinical assessment and plan.  Danil Wedge K Alyzah Pelly   

## 2018-06-18 LAB — COMPREHENSIVE METABOLIC PANEL
ALBUMIN: 4.5 g/dL (ref 3.6–4.6)
ALT: 15 IU/L (ref 0–32)
AST: 18 IU/L (ref 0–40)
Albumin/Globulin Ratio: 2 (ref 1.2–2.2)
Alkaline Phosphatase: 101 IU/L (ref 39–117)
BUN / CREAT RATIO: 22 (ref 12–28)
BUN: 22 mg/dL (ref 8–27)
Bilirubin Total: 0.4 mg/dL (ref 0.0–1.2)
CALCIUM: 9.5 mg/dL (ref 8.7–10.3)
CO2: 25 mmol/L (ref 20–29)
CREATININE: 1 mg/dL (ref 0.57–1.00)
Chloride: 101 mmol/L (ref 96–106)
GFR, EST AFRICAN AMERICAN: 60 mL/min/{1.73_m2} (ref >59)
GFR, EST NON AFRICAN AMERICAN: 52 mL/min/{1.73_m2} — AB (ref >59)
Globulin, Total: 2.3 g/dL (ref 1.5–4.5)
Glucose: 82 mg/dL (ref 65–99)
Potassium: 4.2 mmol/L (ref 3.5–5.2)
SODIUM: 143 mmol/L (ref 134–144)
Total Protein: 6.8 g/dL (ref 6.0–8.5)

## 2018-06-18 LAB — CBC WITH DIFFERENTIAL/PLATELET
BASOS: 1 %
Basophils Absolute: 0.1 10*3/uL (ref 0.0–0.2)
EOS (ABSOLUTE): 0.2 10*3/uL (ref 0.0–0.4)
EOS: 3 %
HEMOGLOBIN: 15.1 g/dL (ref 11.1–15.9)
Hematocrit: 44.8 % (ref 34.0–46.6)
IMMATURE GRANULOCYTES: 0 %
Immature Grans (Abs): 0 10*3/uL (ref 0.0–0.1)
LYMPHS ABS: 1.1 10*3/uL (ref 0.7–3.1)
Lymphs: 18 %
MCH: 30 pg (ref 26.6–33.0)
MCHC: 33.7 g/dL (ref 31.5–35.7)
MCV: 89 fL (ref 79–97)
MONOS ABS: 0.9 10*3/uL (ref 0.1–0.9)
Monocytes: 15 %
NEUTROS PCT: 63 %
Neutrophils Absolute: 3.7 10*3/uL (ref 1.4–7.0)
Platelets: 218 10*3/uL (ref 150–450)
RBC: 5.04 x10E6/uL (ref 3.77–5.28)
RDW: 12.1 % (ref 11.7–15.4)
WBC: 6 10*3/uL (ref 3.4–10.8)

## 2018-06-18 LAB — LAMOTRIGINE LEVEL: Lamotrigine Lvl: 13.1 ug/mL (ref 2.0–20.0)

## 2018-06-18 LAB — LEVETIRACETAM LEVEL: Levetiracetam Lvl: 18.3 ug/mL (ref 10.0–40.0)

## 2018-06-19 ENCOUNTER — Telehealth: Payer: Self-pay | Admitting: *Deleted

## 2018-06-19 NOTE — Telephone Encounter (Signed)
-----   Message from Suzzanne Cloud, NP sent at 06/19/2018  8:01 AM EDT ----- Please call the patent and let her know that her lab results were unremarkable and that her seizure medications were within normal range. Thank you!

## 2018-06-19 NOTE — Telephone Encounter (Signed)
LMOM (ok per hippa) with below lab results.  She does not need to return this call unless she has questions/fim

## 2018-06-25 ENCOUNTER — Other Ambulatory Visit: Payer: Self-pay | Admitting: Neurology

## 2018-07-27 ENCOUNTER — Other Ambulatory Visit: Payer: Self-pay | Admitting: Neurology

## 2018-09-17 ENCOUNTER — Other Ambulatory Visit: Payer: Self-pay | Admitting: Neurology

## 2018-10-21 ENCOUNTER — Other Ambulatory Visit: Payer: Self-pay | Admitting: Neurology

## 2018-12-18 ENCOUNTER — Other Ambulatory Visit: Payer: Self-pay | Admitting: Neurology

## 2019-01-13 ENCOUNTER — Other Ambulatory Visit: Payer: Self-pay | Admitting: Neurology

## 2019-04-06 ENCOUNTER — Other Ambulatory Visit: Payer: Self-pay | Admitting: Internal Medicine

## 2019-05-03 ENCOUNTER — Other Ambulatory Visit: Payer: Self-pay

## 2019-05-03 ENCOUNTER — Inpatient Hospital Stay (HOSPITAL_COMMUNITY)
Admission: EM | Admit: 2019-05-03 | Discharge: 2019-05-13 | DRG: 483 | Disposition: A | Discharge status: Skilled Nursing Facility | Payer: Medicare PPO | Source: Skilled Nursing Facility | Attending: Family Medicine | Admitting: Family Medicine

## 2019-05-03 ENCOUNTER — Emergency Department (HOSPITAL_COMMUNITY): Payer: Medicare PPO

## 2019-05-03 ENCOUNTER — Encounter (HOSPITAL_COMMUNITY): Payer: Self-pay | Admitting: Emergency Medicine

## 2019-05-03 DIAGNOSIS — S42202A Unspecified fracture of upper end of left humerus, initial encounter for closed fracture: Secondary | ICD-10-CM

## 2019-05-03 DIAGNOSIS — S0181XA Laceration without foreign body of other part of head, initial encounter: Secondary | ICD-10-CM | POA: Diagnosis present

## 2019-05-03 DIAGNOSIS — K5900 Constipation, unspecified: Secondary | ICD-10-CM

## 2019-05-03 DIAGNOSIS — Z972 Presence of dental prosthetic device (complete) (partial): Secondary | ICD-10-CM

## 2019-05-03 DIAGNOSIS — W010XXA Fall on same level from slipping, tripping and stumbling without subsequent striking against object, initial encounter: Secondary | ICD-10-CM | POA: Diagnosis present

## 2019-05-03 DIAGNOSIS — Z8249 Family history of ischemic heart disease and other diseases of the circulatory system: Secondary | ICD-10-CM

## 2019-05-03 DIAGNOSIS — G3184 Mild cognitive impairment, so stated: Secondary | ICD-10-CM | POA: Diagnosis present

## 2019-05-03 DIAGNOSIS — Z9089 Acquired absence of other organs: Secondary | ICD-10-CM

## 2019-05-03 DIAGNOSIS — Z7982 Long term (current) use of aspirin: Secondary | ICD-10-CM

## 2019-05-03 DIAGNOSIS — S42242A 4-part fracture of surgical neck of left humerus, initial encounter for closed fracture: Principal | ICD-10-CM | POA: Diagnosis present

## 2019-05-03 DIAGNOSIS — Z79899 Other long term (current) drug therapy: Secondary | ICD-10-CM

## 2019-05-03 DIAGNOSIS — Y9301 Activity, walking, marching and hiking: Secondary | ICD-10-CM | POA: Diagnosis present

## 2019-05-03 DIAGNOSIS — Z803 Family history of malignant neoplasm of breast: Secondary | ICD-10-CM

## 2019-05-03 DIAGNOSIS — R0902 Hypoxemia: Secondary | ICD-10-CM | POA: Diagnosis present

## 2019-05-03 DIAGNOSIS — Z90722 Acquired absence of ovaries, bilateral: Secondary | ICD-10-CM

## 2019-05-03 DIAGNOSIS — Z9079 Acquired absence of other genital organ(s): Secondary | ICD-10-CM

## 2019-05-03 DIAGNOSIS — Z85828 Personal history of other malignant neoplasm of skin: Secondary | ICD-10-CM

## 2019-05-03 DIAGNOSIS — Y92230 Patient room in hospital as the place of occurrence of the external cause: Secondary | ICD-10-CM | POA: Diagnosis not present

## 2019-05-03 DIAGNOSIS — G2581 Restless legs syndrome: Secondary | ICD-10-CM | POA: Diagnosis present

## 2019-05-03 DIAGNOSIS — G40909 Epilepsy, unspecified, not intractable, without status epilepticus: Secondary | ICD-10-CM | POA: Diagnosis present

## 2019-05-03 DIAGNOSIS — E876 Hypokalemia: Secondary | ICD-10-CM | POA: Diagnosis not present

## 2019-05-03 DIAGNOSIS — S42309A Unspecified fracture of shaft of humerus, unspecified arm, initial encounter for closed fracture: Secondary | ICD-10-CM

## 2019-05-03 DIAGNOSIS — Z20822 Contact with and (suspected) exposure to covid-19: Secondary | ICD-10-CM | POA: Diagnosis present

## 2019-05-03 DIAGNOSIS — R339 Retention of urine, unspecified: Secondary | ICD-10-CM | POA: Diagnosis not present

## 2019-05-03 DIAGNOSIS — S02611A Fracture of condylar process of right mandible, initial encounter for closed fracture: Secondary | ICD-10-CM | POA: Diagnosis present

## 2019-05-03 DIAGNOSIS — Z9049 Acquired absence of other specified parts of digestive tract: Secondary | ICD-10-CM

## 2019-05-03 DIAGNOSIS — S01119A Laceration without foreign body of unspecified eyelid and periocular area, initial encounter: Secondary | ICD-10-CM | POA: Diagnosis present

## 2019-05-03 DIAGNOSIS — Z91040 Latex allergy status: Secondary | ICD-10-CM

## 2019-05-03 DIAGNOSIS — Z881 Allergy status to other antibiotic agents status: Secondary | ICD-10-CM

## 2019-05-03 DIAGNOSIS — J431 Panlobular emphysema: Secondary | ICD-10-CM | POA: Diagnosis present

## 2019-05-03 DIAGNOSIS — Y92009 Unspecified place in unspecified non-institutional (private) residence as the place of occurrence of the external cause: Secondary | ICD-10-CM | POA: Diagnosis present

## 2019-05-03 DIAGNOSIS — W19XXXA Unspecified fall, initial encounter: Secondary | ICD-10-CM

## 2019-05-03 DIAGNOSIS — Z87891 Personal history of nicotine dependence: Secondary | ICD-10-CM

## 2019-05-03 DIAGNOSIS — Z820 Family history of epilepsy and other diseases of the nervous system: Secondary | ICD-10-CM

## 2019-05-03 DIAGNOSIS — M79602 Pain in left arm: Secondary | ICD-10-CM | POA: Diagnosis not present

## 2019-05-03 DIAGNOSIS — M810 Age-related osteoporosis without current pathological fracture: Secondary | ICD-10-CM | POA: Diagnosis present

## 2019-05-03 DIAGNOSIS — S02651A Fracture of angle of right mandible, initial encounter for closed fracture: Secondary | ICD-10-CM

## 2019-05-03 DIAGNOSIS — Z882 Allergy status to sulfonamides status: Secondary | ICD-10-CM

## 2019-05-03 DIAGNOSIS — J449 Chronic obstructive pulmonary disease, unspecified: Secondary | ICD-10-CM | POA: Diagnosis present

## 2019-05-03 DIAGNOSIS — I10 Essential (primary) hypertension: Secondary | ICD-10-CM | POA: Diagnosis present

## 2019-05-03 DIAGNOSIS — J189 Pneumonia, unspecified organism: Secondary | ICD-10-CM | POA: Diagnosis not present

## 2019-05-03 DIAGNOSIS — Z66 Do not resuscitate: Secondary | ICD-10-CM | POA: Diagnosis present

## 2019-05-03 DIAGNOSIS — K08409 Partial loss of teeth, unspecified cause, unspecified class: Secondary | ICD-10-CM | POA: Diagnosis present

## 2019-05-03 DIAGNOSIS — Z90710 Acquired absence of both cervix and uterus: Secondary | ICD-10-CM

## 2019-05-03 DIAGNOSIS — Z7983 Long term (current) use of bisphosphonates: Secondary | ICD-10-CM

## 2019-05-03 DIAGNOSIS — E785 Hyperlipidemia, unspecified: Secondary | ICD-10-CM | POA: Diagnosis present

## 2019-05-03 DIAGNOSIS — G250 Essential tremor: Secondary | ICD-10-CM | POA: Diagnosis present

## 2019-05-03 DIAGNOSIS — T40605A Adverse effect of unspecified narcotics, initial encounter: Secondary | ICD-10-CM | POA: Diagnosis not present

## 2019-05-03 MED ORDER — LIDOCAINE-EPINEPHRINE 2 %-1:100000 IJ SOLN
20.0000 mL | Freq: Once | INTRAMUSCULAR | Status: AC
  Administered 2019-05-04: 20 mL via INTRADERMAL
  Filled 2019-05-03: qty 1

## 2019-05-03 MED ORDER — HYDROMORPHONE HCL 1 MG/ML IJ SOLN
1.0000 mg | Freq: Once | INTRAMUSCULAR | Status: AC
  Administered 2019-05-03: 23:00:00 1 mg via INTRAVENOUS
  Filled 2019-05-03: qty 1

## 2019-05-03 NOTE — ED Triage Notes (Signed)
86 yo female [facility]  Status post witnessed fall. Pt had no LOC and is not on anticoagulant therapy. Staff nurse was able to assist pt up and into chair. Pt unable to wear c-collar due to kyphosis. Pt has Lacerations on chin and jawline per EMS. Pt is also complaining of Left shoulder pain as well as left chest and jaw from fall. Pt denies N/V.Pt denies any other complaints. Pt is AOx4 at baseline per EMS. Pt typically walks with a walker. Pt has MOST form and DNR at bedside.  Vitals: bp 176/96 Hr 86 rr 16 spo2 97% on room air

## 2019-05-03 NOTE — ED Provider Notes (Signed)
[facility] DEPT Provider Note   CSN: [id number] Arrival date & time: 05/03/19  2013     History Chief Complaint  Patient presents with  . Fall  . Shoulder Pain    [name] is a 86 y.o. female with history of COPD, seizure d/o, osteoporosis, HTN who presents with a fall. Pt is a resident at [facility] in independent living. She states she was walking with her walker and tripped and fell directly on to her chest. The floor was thin carpet. She denies LOC. She reports central chest pain, severe L shoulder pain, and some jaw pain. Pain is constant and non-radiating. She did not have any pain prior to the fall and has been doing well. She denies recent fever, headache, dizziness, neck pain, SOB, abdominal pain, N/V, lower extremity pain. She is not on blood thinners. She is DNR. She is UTD on tetanus   HPI   Past Medical History:  Diagnosis Date  . Acute bronchitis 05/23/2011  . Acute upper respiratory infections of unspecified site 05/23/2011  . Arthritis   . Chronic airway obstruction, not elsewhere classified 05/23/2011  . Disturbance of salivary secretion 01/31/2011  . Dizziness and giddiness 01/31/2011  . Essential tremor 04/25/2014  . External hemorrhoids without mention of complication [id number]  . Gait disorder 04/25/2014  . Insomnia, unspecified 09/12/2011  . Lumbago 01/31/2011  . Major depressive disorder, single episode, unspecified 01/31/2011  . Memory disorder 04/25/2014  . Mitral valve disorders(424.0) 01/31/2011  . Other and unspecified hyperlipidemia 01/31/2011  . Other convulsions 01/31/2011  . Other emphysema ([place]) 01/31/2011  . Pain in joint, site unspecified 01/31/2011  . Restless legs syndrome (RLS) 09/12/2011  . Retinal detachment with retinal defect of right eye 2011   right eye twice  . Senile osteoporosis 01/31/2011  . Spontaneous ecchymoses 01/31/2011  . Stiffness of joints, not elsewhere classified, multiple sites  01/31/2011  . Unspecified essential hypertension 01/31/2011    Patient Active Problem List   Diagnosis Date Noted  . Right thyroid nodule 02/28/2016  . Abnormal thyroid uptake 11/10/2015  . Coronary artery calcification 12/05/2014  . Panlobular emphysema ([place]) 07/25/2014  . Dyspnea and respiratory abnormality 07/25/2014  . Multiple lung nodules on CT 07/25/2014  . Smoking history 07/25/2014  . Pain of right lower leg 05/23/2014  . Fall 05/23/2014  . Gait disorder 04/25/2014  . Memory disorder 04/25/2014  . Essential tremor 04/25/2014  . Advanced directives, counseling/discussion 02/18/2013  . Seizure disorder ([place]) 10/15/2012  . Cancer of skin, squamous cell 10/15/2012  . Unspecified vitamin D deficiency 10/15/2012  . Other and unspecified hyperlipidemia 10/15/2012  . Edema 09/10/2012  . Restless legs syndrome (RLS) 09/12/2011  . Insomnia, unspecified 09/12/2011  . COPD (chronic obstructive pulmonary disease) ([place]) 05/23/2011  . Essential hypertension 01/31/2011  . Senile osteoporosis 01/31/2011  . Major depressive disorder, single episode 01/31/2011    Past Surgical History:  Procedure Laterality Date  . ABDOMINAL HYSTERECTOMY  06/21/2003   TAH/BSO, omenectomy PSB resect, Stg IC cystadenofibroma  . CHOLECYSTECTOMY  2005   Dr. Marlou Starks  . ELBOW SURGERY Right 2008   broken   Dr. Apolonio Schneiders  . EYE SURGERY    . RETINAL DETACHMENT SURGERY N/A    two  . ROTATOR CUFF REPAIR Right 2012   Dr. Theda Sers  . SQUAMOUS CELL CARCINOMA EXCISION Bilateral 2012, 8/14   [place] on legs   Dr. Sarajane Jews  . TONSILLECTOMY  1941  . VIDEO BRONCHOSCOPY WITH ENDOBRONCHIAL NAVIGATION N/A 11/29/2015  Procedure: VIDEO BRONCHOSCOPY WITH ENDOBRONCHIAL NAVIGATION;  Surgeon: Collene Gobble, MD;  Location: [facility] OR;  Service: Thoracic;  Laterality: N/A;     OB History    Gravida  0   Para  0   Term  0   Preterm  0   AB  0   Living  0     SAB  0   TAB  0   Ectopic  0   Multiple  0   Live Births               Family History  Problem Relation Age of Onset  . Heart disease Father        CHF  . Cancer Mother        breast  . Seizures Sister     Social History   Tobacco Use  . Smoking status: Former Smoker    Packs/day: 1.00    Years: 40.00    Pack years: 40.00    Types: Cigarettes    Quit date: 04/08/1988    Years since quitting: 31.0  . Smokeless tobacco: Never Used  . Tobacco comment: quit in 1990  Substance Use Topics  . Alcohol use: No    Alcohol/week: 0.0 standard drinks  . Drug use: No    Home Medications Prior to Admission medications   Medication Sig Start Date End Date Taking? Authorizing Provider  alendronate (FOSAMAX) 70 MG tablet Take 70 mg by mouth once a week. 03/26/19  Yes [provider]  aspirin 81 MG tablet Take 81 mg by mouth daily.   Yes [provider]  hydrochlorothiazide (MICROZIDE) 12.5 MG capsule Take 12.5 mg by mouth daily.   Yes [provider]  hydrocortisone 2.5 % cream Apply 1 application topically as needed (for psoriasis).  09/20/15  Yes [provider]  ketoconazole (NIZORAL) 2 % cream Apply 1 application topically as needed (for psoriasis).  09/20/15  Yes [provider]  lamoTRIgine (LAMICTAL) 150 MG tablet TAKE 1 TABLET BY MOUTH TWICE DAILY. Patient taking differently: Take 150 mg by mouth 2 (two) times daily.  10/21/18  Yes Suzzanne Cloud, NP  levETIRAcetam (KEPPRA) 500 MG tablet TAKE 1/2 TABLET IN THE AM AND 1 TABLET AT BEDTIME. Patient taking differently: Take 250-500 mg by mouth See admin instructions. TAKE 1/2 TABLET IN THE AM AND 1 TABLET AT BEDTIME. 10/21/18  Yes Suzzanne Cloud, NP  memantine (NAMENDA) 10 MG tablet TAKE 1 TABLET BY MOUTH TWICE DAILY. Patient taking differently: Take 10 mg by mouth 2 (two) times daily.  01/13/19  Yes Suzzanne Cloud, NP  pramipexole (MIRAPEX) 0.25 MG tablet Take 1 tablet (0.25 mg total) by mouth daily. 06/16/18  Yes Suzzanne Cloud, NP  PROAIR HFA 108  (90 Base) MCG/ACT inhaler 2 PUFFS INTO THE LUNGS EVERY 4 HOURS IF NEEDED FOR WHEEZING/SHORTNESSOF BREATH. Patient taking differently: Inhale 2 puffs into the lungs every 4 (four) hours as needed for wheezing or shortness of breath.  03/31/17  Yes Parrett, Tammy S, NP  simvastatin (ZOCOR) 10 MG tablet Take 10 mg by mouth daily. Reported on 06/27/2015   Yes [provider]  tiotropium (SPIRIVA HANDIHALER) 18 MCG inhalation capsule INHALE CONTENTS OF ONE CAPSULE ONCE DAILY FOR COPD. Patient taking differently: Place 18 mcg into inhaler and inhale daily.  10/02/17  Yes Brand Males, MD  cholecalciferol (VITAMIN D) 1000 units tablet Take 1 tablet (1,000 Units total) by mouth daily. Patient not taking: Reported on 05/03/2019 11/29/15  Collene Gobble, MD    Allergies    Latex, Sulfa antibiotics, and Tetracyclines & related  Review of Systems   Review of Systems  Constitutional: Negative for chills and fever.  Respiratory: Positive for shortness of breath (chronic, unchanged). Negative for cough.   Cardiovascular: Positive for chest pain (post-trauma).  Gastrointestinal: Negative for abdominal pain, nausea and vomiting.  Musculoskeletal: Positive for arthralgias and joint swelling. Negative for back pain and neck pain.  Skin: Positive for wound.  Neurological: Negative for dizziness, weakness, numbness and headaches.  All other systems reviewed and are negative.   Physical Exam Updated Vital Signs BP (!) 159/99 (BP Location: Right Arm)   Pulse (!) 107   Resp 16   Ht 5' 5.5" (1.664 m)   Wt 74.8 kg   LMP 02/16/1975   SpO2 94%   BMI 27.04 kg/m   Physical Exam Vitals and nursing note reviewed.  Constitutional:      General: She is not in acute distress.    Appearance: Normal appearance. She is well-developed. She is not ill-appearing.     Comments: Calm, cooperative, pleasant  HENT:     Head: Normocephalic.     Comments: Mild R jaw tenderness  1cm laceration over the  anterior chin  3cm laceration under the chin Eyes:     General: No scleral icterus.       Right eye: No discharge.        Left eye: No discharge.     Conjunctiva/sclera: Conjunctivae normal.     Pupils: Pupils are equal, round, and reactive to light.  Cardiovascular:     Rate and Rhythm: Normal rate and regular rhythm.  Pulmonary:     Effort: Pulmonary effort is normal. No respiratory distress.     Breath sounds: Normal breath sounds.  Abdominal:     General: There is no distension.     Palpations: Abdomen is soft.     Tenderness: There is no abdominal tenderness.  Musculoskeletal:     Cervical back: Normal range of motion.     Comments: L shoulder: Significant swelling and ecchymosis of the left proximal humerus with associated tenderness. No elbow or hand tenderness. ROM deferred. 5/5 grip strength. Cap refill <2. N/V intact.   Skin:    General: Skin is warm and dry.  Neurological:     Mental Status: She is alert and oriented to person, place, and time.  Psychiatric:        Mood and Affect: Mood normal.        Behavior: Behavior normal.     ED Results / Procedures / Treatments   Labs (all labs ordered are listed, but only abnormal results are displayed) Labs Reviewed  RESPIRATORY PANEL BY RT PCR (FLU A&B, COVID)  COMPREHENSIVE METABOLIC PANEL  CBC WITH DIFFERENTIAL/PLATELET    EKG None  Radiology DG Chest 2 View  Result Date: 05/03/2019 CLINICAL DATA:  Chest pain after fall EXAM: CHEST - 2 VIEW COMPARISON:  11/29/2015 FINDINGS: Low lung volumes with linear scarring or atelectasis at the bases. Stable cardiomediastinal silhouette with aortic atherosclerosis. No pneumothorax. Displaced fracture involving the proximal left humerus. IMPRESSION: 1. Low lung volumes with atelectasis or scarring at the bases 2. Displaced proximal left humerus fracture Electronically Signed   By: Donavan Foil M.D.   On: 05/03/2019 22:02   CT Head Wo Contrast  Result Date:  05/03/2019 CLINICAL DATA:  Fall, facial lacerations EXAM: CT HEAD WITHOUT CONTRAST CT MAXILLOFACIAL WITHOUT CONTRAST CT CERVICAL SPINE  WITHOUT CONTRAST TECHNIQUE: Multidetector CT imaging of the head, cervical spine, and maxillofacial structures were performed using the standard protocol without intravenous contrast. Multiplanar CT image reconstructions of the cervical spine and maxillofacial structures were also generated. COMPARISON:  MR head 12/08/2012, CT head 08/16/2009 FINDINGS: CT HEAD FINDINGS Brain: Punctate foci of hypoattenuation the bilateral basal ganglia likely reflect sequela of remote lacunar infarcts several of which were present on comparison CT from 20/11. There mixed patchy and confluent areas of hypoattenuation throughout the subcortical deep and periventricular white matter, similar to slightly advanced from comparison in most compatible with chronic microvascular angiopathy. Symmetric prominence of the ventricles, cisterns and sulci compatible with parenchymal volume loss. Slight ex vacuo dilatation of the ventricles. No convincing evidence of acute infarction, hemorrhage, frank hydrocephalus, extra-axial collection or mass lesion/mass effect. Vascular: Atherosclerotic calcification of the carotid siphons. No hyperdense vessel. Skull: No calvarial fracture, scalp swelling or hematoma. Other: None CT MAXILLOFACIAL FINDINGS Osseous: The osseous structures appear diffusely demineralized which may limit detection of small or nondisplaced fractures. No fracture of the bony orbits. Nasal bones are intact. No mid face fractures are seen. The pterygoid plates are intact. There is a comminuted fracture of the right mandibular condyle which appears impacted and slightly anteriorly displaced relative to the contralateral side. No other mandibular fracture is identified. The left temporomandibular joint is more normally aligned. 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 No  temporal bone fractures are identified. There is complete absence of the mandibular dentition. Maxillary dental bridge is noted with several dental implants. Orbits: The globes appear normal and symmetric with evidence of prior bilateral lens extractions. Symmetric appearance of the extraocular musculature and optic nerve sheath complexes. Normal caliber of the superior ophthalmic veins. Sinuses: Minimal thickening in the maxillary and ethmoidal sinuses. No air-fluid levels. Mastoid air cells are well aerated, better seen on CT images head. Middle ear cavities are clear. Ossicular chains appear normally configured. Soft tissues: Some pre mandibular soft tissue swelling and thickening is noted, compatible with the reported site of laceration. No other acute soft tissue abnormality is seen. CT CERVICAL SPINE FINDINGS Alignment: Slight exaggeration of the normal cervical lordosis is likely positional. Stabilization collar is not visualized at the time of exam. There is narrowing of the basion dens interval with developing and no a pseudoarticulation which is a variant finding in can be seen in degenerative change. There is slight stepwise retrolisthesis of C3-C5 on a likely degenerative basis given spondylitic and facet changes at the involved levels. No traumatic listhesis. No abnormally widened, perched or jumped facets. Skull base and vertebrae: No acute fracture. No primary bone lesion or focal pathologic process. Soft tissues and spinal canal: No pre or paravertebral fluid or swelling. No visible canal hematoma. Disc levels: Multilevel intervertebral disc height loss with spondylitic endplate changes. Disc osteophyte complexes at the C2-C6 levels efface the ventral thecal sac with at most mild canal stenosis. Uncinate spurring and facet hypertrophic changes result in mild-to-moderate multilevel neural foraminal narrowing with slightly more severe narrowing on the right at C3-4 and C4-5. Upper chest: No acute  abnormality in the upper chest or imaged lung apices. Other: Slightly heterogeneous enlarged thyroid with few punctate calcifications. IMPRESSION: 1. No acute intracranial abnormality. No scalp swelling or calvarial fracture. 2. 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 3. Chronic microvascular angiopathic changes and parenchymal volume loss, somewhat progressed since comparison studies. 4. Comminuted, impacted and anteriorly displaced fracture of the right mandibular condyle. No other  mandibular fracture identified. Associated pre mandibular soft tissue swelling which likely correlates with site of reported laceration. 5. No other facial bone fracture. 6. No acute fracture or traumatic listhesis of the cervical spine. 7. Multilevel intervertebral disc height loss and spondylitic changes detailed above. 8. Heterogeneous, nodular thyroid. If further evaluation is warranted on a clinical basis following discussion of patient's risk factors and comorbidities, consider follow-up ultrasound. This follows consensus guidelines: Managing Incidental Thyroid Nodules Detected on Imaging: White Paper of the ACR Incidental Thyroid Findings Committee. J Am Coll Radiol 2015; 12:143-150. and [facility] 3-tiered system for managing ITNs: J Am Coll Radiol. 2015; Feb;12(2): 143-50 Electronically Signed   By: Lovena Le M.D.   On: 05/03/2019 22:34   CT Cervical Spine Wo Contrast  Result Date: 05/03/2019 CLINICAL DATA:  Fall, facial lacerations EXAM: CT HEAD WITHOUT CONTRAST CT MAXILLOFACIAL WITHOUT CONTRAST CT CERVICAL SPINE WITHOUT CONTRAST TECHNIQUE: Multidetector CT imaging of the head, cervical spine, and maxillofacial structures were performed using the standard protocol without intravenous contrast. Multiplanar CT image reconstructions of the cervical spine and maxillofacial structures were also generated. COMPARISON:  MR head 12/08/2012, CT head 08/16/2009 FINDINGS: CT HEAD FINDINGS Brain: Punctate foci of  hypoattenuation the bilateral basal ganglia likely reflect sequela of remote lacunar infarcts several of which were present on comparison CT from 20/11. There mixed patchy and confluent areas of hypoattenuation throughout the subcortical deep and periventricular white matter, similar to slightly advanced from comparison in most compatible with chronic microvascular angiopathy. Symmetric prominence of the ventricles, cisterns and sulci compatible with parenchymal volume loss. Slight ex vacuo dilatation of the ventricles. No convincing evidence of acute infarction, hemorrhage, frank hydrocephalus, extra-axial collection or mass lesion/mass effect. Vascular: Atherosclerotic calcification of the carotid siphons. No hyperdense vessel. Skull: No calvarial fracture, scalp swelling or hematoma. Other: None CT MAXILLOFACIAL FINDINGS Osseous: The osseous structures appear diffusely demineralized which may limit detection of small or nondisplaced fractures. No fracture of the bony orbits. Nasal bones are intact. No mid face fractures are seen. The pterygoid plates are intact. There is a comminuted fracture of the right mandibular condyle which appears impacted and slightly anteriorly displaced relative to the contralateral side. No other mandibular fracture is identified. The left temporomandibular joint is more normally aligned. 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 No temporal bone fractures are identified. There is complete absence of the mandibular dentition. Maxillary dental bridge is noted with several dental implants. Orbits: The globes appear normal and symmetric with evidence of prior bilateral lens extractions. Symmetric appearance of the extraocular musculature and optic nerve sheath complexes. Normal caliber of the superior ophthalmic veins. Sinuses: Minimal thickening in the maxillary and ethmoidal sinuses. No air-fluid levels. Mastoid air cells are well aerated, better seen on  CT images head. Middle ear cavities are clear. Ossicular chains appear normally configured. Soft tissues: Some pre mandibular soft tissue swelling and thickening is noted, compatible with the reported site of laceration. No other acute soft tissue abnormality is seen. CT CERVICAL SPINE FINDINGS Alignment: Slight exaggeration of the normal cervical lordosis is likely positional. Stabilization collar is not visualized at the time of exam. There is narrowing of the basion dens interval with developing and no a pseudoarticulation which is a variant finding in can be seen in degenerative change. There is slight stepwise retrolisthesis of C3-C5 on a likely degenerative basis given spondylitic and facet changes at the involved levels. No traumatic listhesis. No abnormally widened, perched or jumped facets. Skull base and vertebrae:  No acute fracture. No primary bone lesion or focal pathologic process. Soft tissues and spinal canal: No pre or paravertebral fluid or swelling. No visible canal hematoma. Disc levels: Multilevel intervertebral disc height loss with spondylitic endplate changes. Disc osteophyte complexes at the C2-C6 levels efface the ventral thecal sac with at most mild canal stenosis. Uncinate spurring and facet hypertrophic changes result in mild-to-moderate multilevel neural foraminal narrowing with slightly more severe narrowing on the right at C3-4 and C4-5. Upper chest: No acute abnormality in the upper chest or imaged lung apices. Other: Slightly heterogeneous enlarged thyroid with few punctate calcifications. IMPRESSION: 1. No acute intracranial abnormality. No scalp swelling or calvarial fracture. 2. 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 3. Chronic microvascular angiopathic changes and parenchymal volume loss, somewhat progressed since comparison studies. 4. Comminuted, impacted and anteriorly displaced fracture of the right mandibular condyle. No other mandibular fracture identified.  Associated pre mandibular soft tissue swelling which likely correlates with site of reported laceration. 5. No other facial bone fracture. 6. No acute fracture or traumatic listhesis of the cervical spine. 7. Multilevel intervertebral disc height loss and spondylitic changes detailed above. 8. Heterogeneous, nodular thyroid. If further evaluation is warranted on a clinical basis following discussion of patient's risk factors and comorbidities, consider follow-up ultrasound. This follows consensus guidelines: Managing Incidental Thyroid Nodules Detected on Imaging: White Paper of the ACR Incidental Thyroid Findings Committee. J Am Coll Radiol 2015; 12:143-150. and [facility] 3-tiered system for managing ITNs: J Am Coll Radiol. 2015; Feb;12(2): 143-50 Electronically Signed   By: Lovena Le M.D.   On: 05/03/2019 22:34   DG Shoulder Left  Result Date: 05/03/2019 CLINICAL DATA:  Fall with shoulder pain and deformity EXAM: LEFT SHOULDER - 2+ VIEW COMPARISON:  None. FINDINGS: AC joint is intact. Acute comminuted humeral neck fracture with close to 1 shaft diameter of displacement for the midline. Moderate valgus angulation of distal fracture fragment. Mildly displaced greater tuberosity fracture fragment. IMPRESSION: Acute displaced and angulated proximal humerus fracture involving the humeral neck and greater tuberosity. Electronically Signed   By: Donavan Foil M.D.   On: 05/03/2019 22:04   CT Maxillofacial WO CM  Result Date: 05/03/2019 CLINICAL DATA:  Fall, facial lacerations EXAM: CT HEAD WITHOUT CONTRAST CT MAXILLOFACIAL WITHOUT CONTRAST CT CERVICAL SPINE WITHOUT CONTRAST TECHNIQUE: Multidetector CT imaging of the head, cervical spine, and maxillofacial structures were performed using the standard protocol without intravenous contrast. Multiplanar CT image reconstructions of the cervical spine and maxillofacial structures were also generated. COMPARISON:  MR head 12/08/2012, CT head 08/16/2009 FINDINGS: CT HEAD  FINDINGS Brain: Punctate foci of hypoattenuation the bilateral basal ganglia likely reflect sequela of remote lacunar infarcts several of which were present on comparison CT from 20/11. There mixed patchy and confluent areas of hypoattenuation throughout the subcortical deep and periventricular white matter, similar to slightly advanced from comparison in most compatible with chronic microvascular angiopathy. Symmetric prominence of the ventricles, cisterns and sulci compatible with parenchymal volume loss. Slight ex vacuo dilatation of the ventricles. No convincing evidence of acute infarction, hemorrhage, frank hydrocephalus, extra-axial collection or mass lesion/mass effect. Vascular: Atherosclerotic calcification of the carotid siphons. No hyperdense vessel. Skull: No calvarial fracture, scalp swelling or hematoma. Other: None CT MAXILLOFACIAL FINDINGS Osseous: The osseous structures appear diffusely demineralized which may limit detection of small or nondisplaced fractures. No fracture of the bony orbits. Nasal bones are intact. No mid face fractures are seen. The pterygoid plates are intact. There is a comminuted fracture of  the right mandibular condyle which appears impacted and slightly anteriorly displaced relative to the contralateral side. No other mandibular fracture is identified. The left temporomandibular joint is more normally aligned. 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 No temporal bone fractures are identified. There is complete absence of the mandibular dentition. Maxillary dental bridge is noted with several dental implants. Orbits: The globes appear normal and symmetric with evidence of prior bilateral lens extractions. Symmetric appearance of the extraocular musculature and optic nerve sheath complexes. Normal caliber of the superior ophthalmic veins. Sinuses: Minimal thickening in the maxillary and ethmoidal sinuses. No air-fluid levels. Mastoid air cells  are well aerated, better seen on CT images head. Middle ear cavities are clear. Ossicular chains appear normally configured. Soft tissues: Some pre mandibular soft tissue swelling and thickening is noted, compatible with the reported site of laceration. No other acute soft tissue abnormality is seen. CT CERVICAL SPINE FINDINGS Alignment: Slight exaggeration of the normal cervical lordosis is likely positional. Stabilization collar is not visualized at the time of exam. There is narrowing of the basion dens interval with developing and no a pseudoarticulation which is a variant finding in can be seen in degenerative change. There is slight stepwise retrolisthesis of C3-C5 on a likely degenerative basis given spondylitic and facet changes at the involved levels. No traumatic listhesis. No abnormally widened, perched or jumped facets. Skull base and vertebrae: No acute fracture. No primary bone lesion or focal pathologic process. Soft tissues and spinal canal: No pre or paravertebral fluid or swelling. No visible canal hematoma. Disc levels: Multilevel intervertebral disc height loss with spondylitic endplate changes. Disc osteophyte complexes at the C2-C6 levels efface the ventral thecal sac with at most mild canal stenosis. Uncinate spurring and facet hypertrophic changes result in mild-to-moderate multilevel neural foraminal narrowing with slightly more severe narrowing on the right at C3-4 and C4-5. Upper chest: No acute abnormality in the upper chest or imaged lung apices. Other: Slightly heterogeneous enlarged thyroid with few punctate calcifications. IMPRESSION: 1. No acute intracranial abnormality. No scalp swelling or calvarial fracture. 2. 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 3. Chronic microvascular angiopathic changes and parenchymal volume loss, somewhat progressed since comparison studies. 4. Comminuted, impacted and anteriorly displaced fracture of the right mandibular condyle. No  other mandibular fracture identified. Associated pre mandibular soft tissue swelling which likely correlates with site of reported laceration. 5. No other facial bone fracture. 6. No acute fracture or traumatic listhesis of the cervical spine. 7. Multilevel intervertebral disc height loss and spondylitic changes detailed above. 8. Heterogeneous, nodular thyroid. If further evaluation is warranted on a clinical basis following discussion of patient's risk factors and comorbidities, consider follow-up ultrasound. This follows consensus guidelines: Managing Incidental Thyroid Nodules Detected on Imaging: White Paper of the ACR Incidental Thyroid Findings Committee. J Am Coll Radiol 2015; 12:143-150. and [facility] 3-tiered system for managing ITNs: J Am Coll Radiol. 2015; Feb;12(2): 143-50 Electronically Signed   By: Lovena Le M.D.   On: 05/03/2019 22:34    Procedures .Marland KitchenLaceration Repair  Date/Time: 05/04/2019 12:30 AM Performed by: Recardo Evangelist, PA-C Authorized by: Recardo Evangelist, PA-C   Consent:    Consent obtained:  Verbal   Consent given by:  Patient   Risks discussed:  Infection and pain   Alternatives discussed:  No treatment Anesthesia (see MAR for exact dosages):    Anesthesia method:  Local infiltration   Local anesthetic:  Lidocaine 2% WITH epi Laceration details:  Location:  Face   Face location:  Chin   Length (cm):  3   Depth (mm):  5 Repair type:    Repair type:  Simple Pre-procedure details:    Preparation:  Patient was prepped and draped in usual sterile fashion Exploration:    Wound exploration: wound explored through full range of motion and entire depth of wound probed and visualized     Wound extent: no underlying fracture noted   Treatment:    Area cleansed with:  Shur-Clens   Amount of cleaning:  Standard   Irrigation method:  Tap   Visualized foreign bodies/material removed: no   Skin repair:    Repair method:  Sutures   Suture size:  6-0   Suture  material:  Prolene   Suture technique:  Simple interrupted   Number of sutures:  3 Approximation:    Approximation:  Close Post-procedure details:    Dressing:  Non-adherent dressing   Patient tolerance of procedure:  Tolerated well, no immediate complications   (including critical care time)    Medications Ordered in ED Medications  HYDROmorphone (DILAUDID) injection 1 mg (1 mg Intravenous Given 05/03/19 2232)  lidocaine-EPINEPHrine (XYLOCAINE W/EPI) 2 %-1:100000 (with pres) injection 20 mL (20 mLs Intradermal Given by Other 05/04/19 0006)    ED Course  I have reviewed the triage vital signs and the nursing notes.  Pertinent labs & imaging results that were available during my care of the patient were reviewed by me and considered in my medical decision making (see chart for details).  86 year old female presents with mechanical fall at her independent living facility.  On exam she is alert and oriented.  Vitals are reassuring.  Will obtain CT head, C-spine, maxillofacial.  Will also obtain chest x-ray and shoulder x-ray.  Will provide pain control.  CT shows comminuted, impacted, and anteriorly displaced fracture of the right mandibular condyle. She is not having any severe jaw pain and is not having any difficulty talking. CXR is negative but L shoulder xray shows acute displaced and angulated proximal humerus fracture involving the humeral neck and greater tuberosity. After pain medicine pt was mildly hypoxic ~88% and was placed on 1L via [place].  Discussed with Dr. Alvan Dame with orthopedics.  He recommends hospital admission to have shoulder fixed in the next 2 days to maximize function of the arm.  He states make patient n.p.o. after midnight, continue pain control and ice.  Will order rapid Covid test..   Chin wound was irrigated and repaired with 3 stitches.  Tetanus is up-to-date.  Shared visit with Dr. Zenia Resides.  Will add on labs and EKG.  Plan for admission to hospital/ Care signed out to  K Humes PA-C at shift change.  MDM Rules/Calculators/A&P     Final Clinical Impression(s) / ED Diagnoses Final diagnoses:  Fall, initial encounter  Closed fracture of proximal end of left humerus, unspecified fracture morphology, initial encounter  Closed fracture of right mandibular angle, initial encounter ([place])  Chin laceration, initial encounter    Rx / DC Orders ED Discharge Orders    None       Recardo Evangelist, PA-C 05/04/19 0039    Lacretia Leigh, MD 05/04/19 1559

## 2019-05-03 NOTE — ED Notes (Signed)
Pt presents complaining of left shoulder pain 10/10. There is apparent bruising and swelling to anterior aspect of left shoulder. Pt has limited mobility of left upper extremity secondary to pain. Pt is also complaining of mid chest wall pain, 10/10 secondary to fall. Pt fell face first directly onto her chest. She also suffered lacerations to chin. Pt is complaining jaw pain as well. PT states she ambulates with walker and just stumbled while being in a hurry. Pt denies dizziness or LOC. Pt denies headache or any other complaints.

## 2019-05-03 NOTE — ED Provider Notes (Signed)
Medical screening examination/treatment/procedure(s) were conducted as a shared visit with non-physician practitioner(s) and myself.  I personally evaluated the patient during the encounter.    86 year old female here for mechanical fall where she struck her left shoulder and has evidence of a fracture.  Patient also has evidence of a patient bone fracture.  Case discussed with orthopedics who recommends admission for operative repair   Lacretia Leigh, MD 05/03/19 2323

## 2019-05-04 ENCOUNTER — Encounter (HOSPITAL_COMMUNITY): Payer: Self-pay | Admitting: Family Medicine

## 2019-05-04 DIAGNOSIS — W19XXXA Unspecified fall, initial encounter: Secondary | ICD-10-CM

## 2019-05-04 DIAGNOSIS — S02651A Fracture of angle of right mandible, initial encounter for closed fracture: Secondary | ICD-10-CM

## 2019-05-04 DIAGNOSIS — E876 Hypokalemia: Secondary | ICD-10-CM | POA: Diagnosis not present

## 2019-05-04 DIAGNOSIS — J438 Other emphysema: Secondary | ICD-10-CM | POA: Diagnosis not present

## 2019-05-04 DIAGNOSIS — T40605A Adverse effect of unspecified narcotics, initial encounter: Secondary | ICD-10-CM | POA: Diagnosis not present

## 2019-05-04 DIAGNOSIS — J189 Pneumonia, unspecified organism: Secondary | ICD-10-CM | POA: Diagnosis not present

## 2019-05-04 DIAGNOSIS — Z66 Do not resuscitate: Secondary | ICD-10-CM | POA: Diagnosis present

## 2019-05-04 DIAGNOSIS — Z9049 Acquired absence of other specified parts of digestive tract: Secondary | ICD-10-CM | POA: Diagnosis not present

## 2019-05-04 DIAGNOSIS — M79602 Pain in left arm: Secondary | ICD-10-CM | POA: Diagnosis present

## 2019-05-04 DIAGNOSIS — W010XXA Fall on same level from slipping, tripping and stumbling without subsequent striking against object, initial encounter: Secondary | ICD-10-CM | POA: Diagnosis present

## 2019-05-04 DIAGNOSIS — S0181XA Laceration without foreign body of other part of head, initial encounter: Secondary | ICD-10-CM | POA: Diagnosis present

## 2019-05-04 DIAGNOSIS — G2581 Restless legs syndrome: Secondary | ICD-10-CM | POA: Diagnosis present

## 2019-05-04 DIAGNOSIS — G3184 Mild cognitive impairment, so stated: Secondary | ICD-10-CM | POA: Diagnosis present

## 2019-05-04 DIAGNOSIS — K08409 Partial loss of teeth, unspecified cause, unspecified class: Secondary | ICD-10-CM | POA: Diagnosis present

## 2019-05-04 DIAGNOSIS — J431 Panlobular emphysema: Secondary | ICD-10-CM | POA: Diagnosis present

## 2019-05-04 DIAGNOSIS — S42309A Unspecified fracture of shaft of humerus, unspecified arm, initial encounter for closed fracture: Secondary | ICD-10-CM | POA: Diagnosis present

## 2019-05-04 DIAGNOSIS — I10 Essential (primary) hypertension: Secondary | ICD-10-CM | POA: Diagnosis present

## 2019-05-04 DIAGNOSIS — Z20822 Contact with and (suspected) exposure to covid-19: Secondary | ICD-10-CM | POA: Diagnosis present

## 2019-05-04 DIAGNOSIS — G40909 Epilepsy, unspecified, not intractable, without status epilepticus: Secondary | ICD-10-CM | POA: Diagnosis present

## 2019-05-04 DIAGNOSIS — S02611A Fracture of condylar process of right mandible, initial encounter for closed fracture: Secondary | ICD-10-CM | POA: Diagnosis present

## 2019-05-04 DIAGNOSIS — S42242A 4-part fracture of surgical neck of left humerus, initial encounter for closed fracture: Secondary | ICD-10-CM | POA: Diagnosis present

## 2019-05-04 DIAGNOSIS — E785 Hyperlipidemia, unspecified: Secondary | ICD-10-CM | POA: Diagnosis present

## 2019-05-04 DIAGNOSIS — Z9079 Acquired absence of other genital organ(s): Secondary | ICD-10-CM | POA: Diagnosis not present

## 2019-05-04 DIAGNOSIS — G250 Essential tremor: Secondary | ICD-10-CM | POA: Diagnosis present

## 2019-05-04 DIAGNOSIS — S42202A Unspecified fracture of upper end of left humerus, initial encounter for closed fracture: Secondary | ICD-10-CM

## 2019-05-04 DIAGNOSIS — M810 Age-related osteoporosis without current pathological fracture: Secondary | ICD-10-CM | POA: Diagnosis present

## 2019-05-04 DIAGNOSIS — R0902 Hypoxemia: Secondary | ICD-10-CM | POA: Diagnosis present

## 2019-05-04 DIAGNOSIS — R339 Retention of urine, unspecified: Secondary | ICD-10-CM | POA: Diagnosis not present

## 2019-05-04 DIAGNOSIS — Z90710 Acquired absence of both cervix and uterus: Secondary | ICD-10-CM | POA: Diagnosis not present

## 2019-05-04 DIAGNOSIS — S01119A Laceration without foreign body of unspecified eyelid and periocular area, initial encounter: Secondary | ICD-10-CM | POA: Diagnosis present

## 2019-05-04 DIAGNOSIS — Y9301 Activity, walking, marching and hiking: Secondary | ICD-10-CM | POA: Diagnosis present

## 2019-05-04 DIAGNOSIS — Y92230 Patient room in hospital as the place of occurrence of the external cause: Secondary | ICD-10-CM | POA: Diagnosis not present

## 2019-05-04 LAB — BASIC METABOLIC PANEL
Anion gap: 11 (ref 5–15)
BUN: 27 mg/dL — ABNORMAL HIGH (ref 8–23)
CO2: 24 mmol/L (ref 22–32)
Calcium: 8.9 mg/dL (ref 8.9–10.3)
Chloride: 104 mmol/L (ref 98–111)
Creatinine, Ser: 0.76 mg/dL (ref 0.44–1.00)
GFR calc Af Amer: >60 mL/min (ref >60)
GFR calc non Af Amer: >60 mL/min (ref >60)
Glucose, Bld: 138 mg/dL — ABNORMAL HIGH (ref 70–99)
Potassium: 3.5 mmol/L (ref 3.5–5.1)
Sodium: 139 mmol/L (ref 135–145)

## 2019-05-04 LAB — CBC WITH DIFFERENTIAL/PLATELET
Abs Immature Granulocytes: 0.07 10*3/uL (ref 0.00–0.07)
Basophils Absolute: 0.1 10*3/uL (ref 0.0–0.1)
Basophils Relative: 1 %
Eosinophils Absolute: 0 10*3/uL (ref 0.0–0.5)
Eosinophils Relative: 0 %
HCT: 41.6 % (ref 36.0–46.0)
Hemoglobin: 13.6 g/dL (ref 12.0–15.0)
Immature Granulocytes: 1 %
Lymphocytes Relative: 5 %
Lymphs Abs: 0.6 10*3/uL — ABNORMAL LOW (ref 0.7–4.0)
MCH: 30.6 pg (ref 26.0–34.0)
MCHC: 32.7 g/dL (ref 30.0–36.0)
MCV: 93.7 fL (ref 80.0–100.0)
Monocytes Absolute: 1.2 10*3/uL — ABNORMAL HIGH (ref 0.1–1.0)
Monocytes Relative: 9 %
Neutro Abs: 11.3 10*3/uL — ABNORMAL HIGH (ref 1.7–7.7)
Neutrophils Relative %: 84 %
Platelets: 198 10*3/uL (ref 150–400)
RBC: 4.44 MIL/uL (ref 3.87–5.11)
RDW: 12.9 % (ref 11.5–15.5)
WBC: 13.3 10*3/uL — ABNORMAL HIGH (ref 4.0–10.5)
nRBC: 0 % (ref 0.0–0.2)

## 2019-05-04 LAB — COMPREHENSIVE METABOLIC PANEL
ALT: 17 U/L (ref 0–44)
AST: 23 U/L (ref 15–41)
Albumin: 3.9 g/dL (ref 3.5–5.0)
Alkaline Phosphatase: 76 U/L (ref 38–126)
Anion gap: 11 (ref 5–15)
BUN: 31 mg/dL — ABNORMAL HIGH (ref 8–23)
CO2: 24 mmol/L (ref 22–32)
Calcium: 9 mg/dL (ref 8.9–10.3)
Chloride: 104 mmol/L (ref 98–111)
Creatinine, Ser: 0.96 mg/dL (ref 0.44–1.00)
GFR calc Af Amer: >60 mL/min (ref >60)
GFR calc non Af Amer: 54 mL/min — ABNORMAL LOW (ref >60)
Glucose, Bld: 132 mg/dL — ABNORMAL HIGH (ref 70–99)
Potassium: 3.5 mmol/L (ref 3.5–5.1)
Sodium: 139 mmol/L (ref 135–145)
Total Bilirubin: 1.1 mg/dL (ref 0.3–1.2)
Total Protein: 6.7 g/dL (ref 6.5–8.1)

## 2019-05-04 LAB — CBC
HCT: 42.6 % (ref 36.0–46.0)
Hemoglobin: 13.8 g/dL (ref 12.0–15.0)
MCH: 30.5 pg (ref 26.0–34.0)
MCHC: 32.4 g/dL (ref 30.0–36.0)
MCV: 94.2 fL (ref 80.0–100.0)
Platelets: 203 10*3/uL (ref 150–400)
RBC: 4.52 MIL/uL (ref 3.87–5.11)
RDW: 13 % (ref 11.5–15.5)
WBC: 10.5 10*3/uL (ref 4.0–10.5)
nRBC: 0 % (ref 0.0–0.2)

## 2019-05-04 LAB — RESPIRATORY PANEL BY RT PCR (FLU A&B, COVID)
Influenza A by PCR: NEGATIVE
Influenza B by PCR: NEGATIVE
SARS Coronavirus 2 by RT PCR: NEGATIVE

## 2019-05-04 MED ORDER — MEMANTINE HCL 10 MG PO TABS
10.0000 mg | ORAL_TABLET | Freq: Two times a day (BID) | ORAL | Status: DC
  Administered 2019-05-04 – 2019-05-13 (×17): 10 mg via ORAL
  Filled 2019-05-04 (×17): qty 1

## 2019-05-04 MED ORDER — TIOTROPIUM BROMIDE MONOHYDRATE 18 MCG IN CAPS
18.0000 ug | ORAL_CAPSULE | Freq: Every day | RESPIRATORY_TRACT | Status: DC
  Administered 2019-05-05 – 2019-05-13 (×9): 18 ug via RESPIRATORY_TRACT
  Filled 2019-05-04 (×2): qty 5

## 2019-05-04 MED ORDER — SIMVASTATIN 20 MG PO TABS
10.0000 mg | ORAL_TABLET | Freq: Every day | ORAL | Status: DC
  Administered 2019-05-04 – 2019-05-13 (×9): 10 mg via ORAL
  Filled 2019-05-04 (×9): qty 1

## 2019-05-04 MED ORDER — LEVETIRACETAM IN NACL 500 MG/100ML IV SOLN
500.0000 mg | Freq: Every day | INTRAVENOUS | Status: DC
  Administered 2019-05-04 – 2019-05-06 (×3): 500 mg via INTRAVENOUS
  Filled 2019-05-04 (×3): qty 100

## 2019-05-04 MED ORDER — ONDANSETRON HCL 4 MG PO TABS: 4.0000 mg | ORAL_TABLET | Freq: Four times a day (QID) | ORAL | Status: DC | PRN

## 2019-05-04 MED ORDER — ASPIRIN EC 81 MG PO TBEC
81.0000 mg | DELAYED_RELEASE_TABLET | Freq: Every day | ORAL | Status: DC
  Administered 2019-05-04 – 2019-05-13 (×9): 81 mg via ORAL
  Filled 2019-05-04 (×9): qty 1

## 2019-05-04 MED ORDER — PRAMIPEXOLE DIHYDROCHLORIDE 0.25 MG PO TABS
0.2500 mg | ORAL_TABLET | Freq: Every day | ORAL | Status: DC
  Administered 2019-05-04 – 2019-05-05 (×2): 0.25 mg via ORAL
  Filled 2019-05-04 (×2): qty 1

## 2019-05-04 MED ORDER — HYDROMORPHONE HCL 1 MG/ML IJ SOLN
0.5000 mg | INTRAMUSCULAR | Status: DC | PRN
  Administered 2019-05-04 – 2019-05-08 (×6): 0.5 mg via INTRAVENOUS
  Filled 2019-05-04 (×6): qty 0.5

## 2019-05-04 MED ORDER — SODIUM CHLORIDE 0.9 % IV SOLN
250.0000 mg | Freq: Every day | INTRAVENOUS | Status: DC
  Administered 2019-05-04 – 2019-05-07 (×4): 250 mg via INTRAVENOUS
  Filled 2019-05-04 (×4): qty 2.5

## 2019-05-04 MED ORDER — LAMOTRIGINE 25 MG PO TABS
150.0000 mg | ORAL_TABLET | Freq: Two times a day (BID) | ORAL | Status: DC
  Administered 2019-05-04 – 2019-05-13 (×17): 150 mg via ORAL
  Filled 2019-05-04 (×6): qty 1
  Filled 2019-05-04: qty 2
  Filled 2019-05-04 (×7): qty 1
  Filled 2019-05-04: qty 2
  Filled 2019-05-04: qty 1
  Filled 2019-05-04: qty 2

## 2019-05-04 MED ORDER — ONDANSETRON HCL 4 MG/2ML IJ SOLN
4.0000 mg | Freq: Four times a day (QID) | INTRAMUSCULAR | Status: DC | PRN
  Administered 2019-05-04 – 2019-05-06 (×2): 4 mg via INTRAVENOUS
  Filled 2019-05-04: qty 2

## 2019-05-04 MED ORDER — DEXTROSE-NACL 5-0.9 % IV SOLN: INTRAVENOUS | Status: DC

## 2019-05-04 MED ORDER — FENTANYL CITRATE (PF) 100 MCG/2ML IJ SOLN
50.0000 ug | INTRAMUSCULAR | Status: DC | PRN
  Administered 2019-05-04 – 2019-05-06 (×4): 50 ug via INTRAVENOUS
  Filled 2019-05-04 (×2): qty 2

## 2019-05-04 MED ORDER — ENOXAPARIN SODIUM 40 MG/0.4ML ~~LOC~~ SOLN
40.0000 mg | SUBCUTANEOUS | Status: DC
  Administered 2019-05-04: 40 mg via SUBCUTANEOUS
  Filled 2019-05-04: qty 0.4

## 2019-05-04 MED ORDER — FENTANYL CITRATE (PF) 100 MCG/2ML IJ SOLN
50.0000 ug | Freq: Once | INTRAMUSCULAR | Status: AC
  Administered 2019-05-04: 02:00:00 50 ug via INTRAVENOUS
  Filled 2019-05-04: qty 2

## 2019-05-04 MED ORDER — SODIUM CHLORIDE 0.9 % IV SOLN: INTRAVENOUS | Status: DC

## 2019-05-04 MED ORDER — LIP MEDEX EX OINT
TOPICAL_OINTMENT | CUTANEOUS | Status: AC
  Administered 2019-05-04: 1
  Filled 2019-05-04: qty 7

## 2019-05-04 MED ORDER — OXYCODONE-ACETAMINOPHEN 5-325 MG PO TABS
1.0000 | ORAL_TABLET | Freq: Four times a day (QID) | ORAL | Status: DC | PRN
  Administered 2019-05-05 – 2019-05-13 (×21): 1 via ORAL
  Filled 2019-05-04 (×23): qty 1

## 2019-05-04 NOTE — Consult Note (Signed)
Reason for Consult:  Facial Trauma Referring Physician: DrMarland Kitchen Derrill Kay  [name] is an 86 y.o. female.  HPI: The patient is a 86 year old female here for treatment after falling while trying to walk with her walker.  She has a history of some memory loss, COPD, gait disorder and mitral valve disease.  Her family support is her sisters.  She lives at an assisted living facility [facility].  She complains of left arm pain and facial pain.  She recalls falling directly on her chest and face.  She has a combination of dentures and her own dentition.  She denies any malocclusion.  She has some tenderness in her right jaw area.  Past Medical History:  Diagnosis Date   Acute bronchitis 05/23/2011   Acute upper respiratory infections of unspecified site 05/23/2011   Arthritis    Chronic airway obstruction, not elsewhere classified 05/23/2011   Disturbance of salivary secretion 01/31/2011   Dizziness and giddiness 01/31/2011   Essential tremor 04/25/2014   External hemorrhoids without mention of complication [id number]   Gait disorder 04/25/2014   Insomnia, unspecified 09/12/2011   Lumbago 01/31/2011   Major depressive disorder, single episode, unspecified 01/31/2011   Memory disorder 04/25/2014   Mitral valve disorders(424.0) 01/31/2011   Other and unspecified hyperlipidemia 01/31/2011   Other convulsions 01/31/2011   Other emphysema ([place]) 01/31/2011   Pain in joint, site unspecified 01/31/2011   Restless legs syndrome (RLS) 09/12/2011   Retinal detachment with retinal defect of right eye 2011   right eye twice   Senile osteoporosis 01/31/2011   Spontaneous ecchymoses 01/31/2011   Stiffness of joints, not elsewhere classified, multiple sites 01/31/2011   Unspecified essential hypertension 01/31/2011    Past Surgical History:  Procedure Laterality Date   ABDOMINAL HYSTERECTOMY  06/21/2003   TAH/BSO, omenectomy PSB resect, Stg IC cystadenofibroma   CHOLECYSTECTOMY   2005   Dr. Marlou Starks   ELBOW SURGERY Right 2008   broken   Dr. Apolonio Schneiders   EYE SURGERY     RETINAL DETACHMENT SURGERY N/A    two   ROTATOR CUFF REPAIR Right 2012   Dr. Freda Jackson CELL CARCINOMA EXCISION Bilateral 2012, 8/14   [place] on legs   Dr. Sarajane Jews   TONSILLECTOMY  1941   VIDEO BRONCHOSCOPY WITH ENDOBRONCHIAL NAVIGATION N/A 11/29/2015   Procedure: VIDEO BRONCHOSCOPY WITH ENDOBRONCHIAL NAVIGATION;  Surgeon: Collene Gobble, MD;  Location: [facility] OR;  Service: Thoracic;  Laterality: N/A;    Family History  Problem Relation Age of Onset   Heart disease Father        CHF   Cancer Mother        breast   Seizures Sister     Social History:  reports that she quit smoking about 31 years ago. Her smoking use included cigarettes. She has a 40.00 pack-year smoking history. She has never used smokeless tobacco. She reports that she does not drink alcohol or use drugs.  Allergies:  Allergies  Allergen Reactions   Latex Other (See Comments)    Swelling   Sulfa Antibiotics Nausea And Vomiting   Tetracyclines & Related     Medications: I have reviewed the patient's current medications.  Results for orders placed or performed during the hospital encounter of 05/03/19 (from the past 48 hour(s))  Respiratory Panel by RT PCR (Flu A&B, Covid) - Nasopharyngeal Swab     Status: None   Collection Time: 05/03/19 11:30 PM   Specimen: Nasopharyngeal Swab  Result Value Ref Range  SARS Coronavirus 2 by RT PCR NEGATIVE NEGATIVE    Comment: (NOTE) SARS-CoV-2 target nucleic acids are NOT DETECTED. The SARS-CoV-2 RNA is generally detectable in upper respiratoy specimens during the acute phase of infection. The lowest concentration of SARS-CoV-2 viral copies this assay can detect is 131 copies/mL. A negative result does not preclude SARS-Cov-2 infection and should not be used as the sole basis for treatment or other patient management decisions. A negative result may occur with    improper specimen collection/handling, submission of specimen other than nasopharyngeal swab, presence of viral mutation(s) within the areas targeted by this assay, and inadequate number of viral copies (<131 copies/mL). A negative result must be combined with clinical observations, patient history, and epidemiological information. The expected result is Negative. Fact Sheet for Patients:  PinkCheek.be Fact Sheet for Healthcare Providers:  GravelBags.it This test is not yet ap proved or cleared by the [place] FDA and  has been authorized for detection and/or diagnosis of SARS-CoV-2 by FDA under an Emergency Use Authorization (EUA). This EUA will remain  in effect (meaning this test can be used) for the duration of the COVID-19 declaration under Section 564(b)(1) of the Act, 21 U.S.C. section 360bbb-3(b)(1), unless the authorization is terminated or revoked sooner.    Influenza A by PCR NEGATIVE NEGATIVE   Influenza B by PCR NEGATIVE NEGATIVE    Comment: (NOTE) The Xpert Xpress SARS-CoV-2/FLU/RSV assay is intended as an aid in  the diagnosis of influenza from Nasopharyngeal swab specimens and  should not be used as a sole basis for treatment. Nasal washings and  aspirates are unacceptable for Xpert Xpress SARS-CoV-2/FLU/RSV  testing. Fact Sheet for Patients: PinkCheek.be Fact Sheet for Healthcare Providers: GravelBags.it This test is not yet approved or cleared by the [place] FDA and  has been authorized for detection and/or diagnosis of SARS-CoV-2 by  FDA under an Emergency Use Authorization (EUA). This EUA will remain  in effect (meaning this test can be used) for the duration of the  Covid-19 declaration under Section 564(b)(1) of the Act, 21  U.S.C. section 360bbb-3(b)(1), unless the authorization is  terminated or revoked. Performed at [facility], [place] [address] [place], [place] [address]   Comprehensive metabolic panel     Status: Abnormal   Collection Time: 05/04/19 12:25 AM  Result Value Ref Range   Sodium 139 135 - 145 mmol/L   Potassium 3.5 3.5 - 5.1 mmol/L   Chloride 104 98 - 111 mmol/L   CO2 24 22 - 32 mmol/L   Glucose, Bld 132 (H) 70 - 99 mg/dL   BUN 31 (H) 8 - 23 mg/dL   Creatinine, Ser 0.96 0.44 - 1.00 mg/dL   Calcium 9.0 8.9 - 10.3 mg/dL   Total Protein 6.7 6.5 - 8.1 g/dL   Albumin 3.9 3.5 - 5.0 g/dL   AST 23 15 - 41 U/L   ALT 17 0 - 44 U/L   Alkaline Phosphatase 76 38 - 126 U/L   Total Bilirubin 1.1 0.3 - 1.2 mg/dL   GFR calc non Af Amer 54 (L) >60 mL/min   GFR calc Af Amer >60 >60 mL/min   Anion gap 11 5 - 15    Comment: Performed at [facility], [place] [address], [place], [place] [address]  CBC with Differential     Status: Abnormal   Collection Time: 05/04/19 12:25 AM  Result Value Ref Range   WBC 13.3 (H) 4.0 - 10.5 K/uL  RBC 4.44 3.87 - 5.11 MIL/uL   Hemoglobin 13.6 12.0 - 15.0 g/dL   HCT 41.6 36.0 - 46.0 %   MCV 93.7 80.0 - 100.0 fL   MCH 30.6 26.0 - 34.0 pg   MCHC 32.7 30.0 - 36.0 g/dL   RDW 12.9 11.5 - 15.5 %   Platelets 198 150 - 400 K/uL   nRBC 0.0 0.0 - 0.2 %   Neutrophils Relative % 84 %   Neutro Abs 11.3 (H) 1.7 - 7.7 K/uL   Lymphocytes Relative 5 %   Lymphs Abs 0.6 (L) 0.7 - 4.0 K/uL   Monocytes Relative 9 %   Monocytes Absolute 1.2 (H) 0.1 - 1.0 K/uL   Eosinophils Relative 0 %   Eosinophils Absolute 0.0 0.0 - 0.5 K/uL   Basophils Relative 1 %   Basophils Absolute 0.1 0.0 - 0.1 K/uL   Immature Granulocytes 1 %   Abs Immature Granulocytes 0.07 0.00 - 0.07 K/uL    Comment: Performed at [facility], [place] [address], [place], [place] [id number]  Basic metabolic panel     Status: Abnormal   Collection Time: 05/04/19  4:49 AM  Result Value Ref Range   Sodium 139 135 - 145 mmol/L   Potassium 3.5 3.5 - 5.1 mmol/L   Chloride 104  98 - 111 mmol/L   CO2 24 22 - 32 mmol/L   Glucose, Bld 138 (H) 70 - 99 mg/dL   BUN 27 (H) 8 - 23 mg/dL   Creatinine, Ser 0.76 0.44 - 1.00 mg/dL   Calcium 8.9 8.9 - 10.3 mg/dL   GFR calc non Af Amer >60 >60 mL/min   GFR calc Af Amer >60 >60 mL/min   Anion gap 11 5 - 15    Comment: Performed at [facility], [place] [address], [place], [place] [address]  CBC     Status: None   Collection Time: 05/04/19  4:49 AM  Result Value Ref Range   WBC 10.5 4.0 - 10.5 K/uL   RBC 4.52 3.87 - 5.11 MIL/uL   Hemoglobin 13.8 12.0 - 15.0 g/dL   HCT 42.6 36.0 - 46.0 %   MCV 94.2 80.0 - 100.0 fL   MCH 30.5 26.0 - 34.0 pg   MCHC 32.4 30.0 - 36.0 g/dL   RDW 13.0 11.5 - 15.5 %   Platelets 203 150 - 400 K/uL   nRBC 0.0 0.0 - 0.2 %    Comment: Performed at [facility], [place] [address], [place], [place] [address]    DG Chest 2 View  Result Date: 05/03/2019 CLINICAL DATA:  Chest pain after fall EXAM: CHEST - 2 VIEW COMPARISON:  11/29/2015 FINDINGS: Low lung volumes with linear scarring or atelectasis at the bases. Stable cardiomediastinal silhouette with aortic atherosclerosis. No pneumothorax. Displaced fracture involving the proximal left humerus. IMPRESSION: 1. Low lung volumes with atelectasis or scarring at the bases 2. Displaced proximal left humerus fracture Electronically Signed   By: Donavan Foil M.D.   On: 05/03/2019 22:02   CT Head Wo Contrast  Result Date: 05/03/2019 CLINICAL DATA:  Fall, facial lacerations EXAM: CT HEAD WITHOUT CONTRAST CT MAXILLOFACIAL WITHOUT CONTRAST CT CERVICAL SPINE WITHOUT CONTRAST TECHNIQUE: Multidetector CT imaging of the head, cervical spine, and maxillofacial structures were performed using the standard protocol without intravenous contrast. Multiplanar CT image reconstructions of the cervical spine and maxillofacial structures were also generated. COMPARISON:  MR head 12/08/2012, CT head 08/16/2009 FINDINGS: CT HEAD FINDINGS Brain:  Punctate foci of hypoattenuation the bilateral basal ganglia likely reflect sequela of remote lacunar infarcts several of which were present on comparison CT from 20/11. There mixed patchy and confluent areas of hypoattenuation throughout the subcortical deep and periventricular white matter, similar to slightly advanced from comparison in most compatible with chronic microvascular angiopathy. Symmetric prominence of the ventricles, cisterns and sulci compatible with parenchymal volume loss. Slight ex vacuo dilatation of the ventricles. No convincing evidence of acute infarction, hemorrhage, frank hydrocephalus, extra-axial collection or mass lesion/mass effect. Vascular: Atherosclerotic calcification of the carotid siphons. No hyperdense vessel. Skull: No calvarial fracture, scalp swelling or hematoma. Other: None CT MAXILLOFACIAL FINDINGS Osseous: The osseous structures appear diffusely demineralized which may limit detection of small or nondisplaced fractures. No fracture of the bony orbits. Nasal bones are intact. No mid face fractures are seen. The pterygoid plates are intact. There is a comminuted fracture of the right mandibular condyle which appears impacted and slightly anteriorly displaced relative to the contralateral side. No other mandibular fracture is identified. The left temporomandibular joint is more normally aligned. 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 No temporal bone fractures are identified. There is complete absence of the mandibular dentition. Maxillary dental bridge is noted with several dental implants. Orbits: The globes appear normal and symmetric with evidence of prior bilateral lens extractions. Symmetric appearance of the extraocular musculature and optic nerve sheath complexes. Normal caliber of the superior ophthalmic veins. Sinuses: Minimal thickening in the maxillary and ethmoidal sinuses. No air-fluid levels. Mastoid air cells are well  aerated, better seen on CT images head. Middle ear cavities are clear. Ossicular chains appear normally configured. Soft tissues: Some pre mandibular soft tissue swelling and thickening is noted, compatible with the reported site of laceration. No other acute soft tissue abnormality is seen. CT CERVICAL SPINE FINDINGS Alignment: Slight exaggeration of the normal cervical lordosis is likely positional. Stabilization collar is not visualized at the time of exam. There is narrowing of the basion dens interval with developing and no a pseudoarticulation which is a variant finding in can be seen in degenerative change. There is slight stepwise retrolisthesis of C3-C5 on a likely degenerative basis given spondylitic and facet changes at the involved levels. No traumatic listhesis. No abnormally widened, perched or jumped facets. Skull base and vertebrae: No acute fracture. No primary bone lesion or focal pathologic process. Soft tissues and spinal canal: No pre or paravertebral fluid or swelling. No visible canal hematoma. Disc levels: Multilevel intervertebral disc height loss with spondylitic endplate changes. Disc osteophyte complexes at the C2-C6 levels efface the ventral thecal sac with at most mild canal stenosis. Uncinate spurring and facet hypertrophic changes result in mild-to-moderate multilevel neural foraminal narrowing with slightly more severe narrowing on the right at C3-4 and C4-5. Upper chest: No acute abnormality in the upper chest or imaged lung apices. Other: Slightly heterogeneous enlarged thyroid with few punctate calcifications. IMPRESSION: 1. No acute intracranial abnormality. No scalp swelling or calvarial fracture. 2. 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 3. Chronic microvascular angiopathic changes and parenchymal volume loss, somewhat progressed since comparison studies. 4. Comminuted, impacted and anteriorly displaced fracture of the right mandibular condyle. No other  mandibular fracture identified. Associated pre mandibular soft tissue swelling which likely correlates with site of reported laceration. 5. No other facial bone fracture. 6. No acute fracture or traumatic listhesis of the cervical spine. 7. Multilevel intervertebral disc height loss and spondylitic changes detailed above. 8. Heterogeneous, nodular thyroid. If  further evaluation is warranted on a clinical basis following discussion of patient's risk factors and comorbidities, consider follow-up ultrasound. This follows consensus guidelines: Managing Incidental Thyroid Nodules Detected on Imaging: White Paper of the ACR Incidental Thyroid Findings Committee. J Am Coll Radiol 2015; 12:143-150. and [facility] 3-tiered system for managing ITNs: J Am Coll Radiol. 2015; Feb;12(2): 143-50 Electronically Signed   By: Lovena Le M.D.   On: 05/03/2019 22:34   CT Cervical Spine Wo Contrast  Result Date: 05/03/2019 CLINICAL DATA:  Fall, facial lacerations EXAM: CT HEAD WITHOUT CONTRAST CT MAXILLOFACIAL WITHOUT CONTRAST CT CERVICAL SPINE WITHOUT CONTRAST TECHNIQUE: Multidetector CT imaging of the head, cervical spine, and maxillofacial structures were performed using the standard protocol without intravenous contrast. Multiplanar CT image reconstructions of the cervical spine and maxillofacial structures were also generated. COMPARISON:  MR head 12/08/2012, CT head 08/16/2009 FINDINGS: CT HEAD FINDINGS Brain: Punctate foci of hypoattenuation the bilateral basal ganglia likely reflect sequela of remote lacunar infarcts several of which were present on comparison CT from 20/11. There mixed patchy and confluent areas of hypoattenuation throughout the subcortical deep and periventricular white matter, similar to slightly advanced from comparison in most compatible with chronic microvascular angiopathy. Symmetric prominence of the ventricles, cisterns and sulci compatible with parenchymal volume loss. Slight ex vacuo dilatation of the  ventricles. No convincing evidence of acute infarction, hemorrhage, frank hydrocephalus, extra-axial collection or mass lesion/mass effect. Vascular: Atherosclerotic calcification of the carotid siphons. No hyperdense vessel. Skull: No calvarial fracture, scalp swelling or hematoma. Other: None CT MAXILLOFACIAL FINDINGS Osseous: The osseous structures appear diffusely demineralized which may limit detection of small or nondisplaced fractures. No fracture of the bony orbits. Nasal bones are intact. No mid face fractures are seen. The pterygoid plates are intact. There is a comminuted fracture of the right mandibular condyle which appears impacted and slightly anteriorly displaced relative to the contralateral side. No other mandibular fracture is identified. The left temporomandibular joint is more normally aligned. 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 No temporal bone fractures are identified. There is complete absence of the mandibular dentition. Maxillary dental bridge is noted with several dental implants. Orbits: The globes appear normal and symmetric with evidence of prior bilateral lens extractions. Symmetric appearance of the extraocular musculature and optic nerve sheath complexes. Normal caliber of the superior ophthalmic veins. Sinuses: Minimal thickening in the maxillary and ethmoidal sinuses. No air-fluid levels. Mastoid air cells are well aerated, better seen on CT images head. Middle ear cavities are clear. Ossicular chains appear normally configured. Soft tissues: Some pre mandibular soft tissue swelling and thickening is noted, compatible with the reported site of laceration. No other acute soft tissue abnormality is seen. CT CERVICAL SPINE FINDINGS Alignment: Slight exaggeration of the normal cervical lordosis is likely positional. Stabilization collar is not visualized at the time of exam. There is narrowing of the basion dens interval with developing and no a  pseudoarticulation which is a variant finding in can be seen in degenerative change. There is slight stepwise retrolisthesis of C3-C5 on a likely degenerative basis given spondylitic and facet changes at the involved levels. No traumatic listhesis. No abnormally widened, perched or jumped facets. Skull base and vertebrae: No acute fracture. No primary bone lesion or focal pathologic process. Soft tissues and spinal canal: No pre or paravertebral fluid or swelling. No visible canal hematoma. Disc levels: Multilevel intervertebral disc height loss with spondylitic endplate changes. Disc osteophyte complexes at the C2-C6 levels efface the ventral thecal sac  with at most mild canal stenosis. Uncinate spurring and facet hypertrophic changes result in mild-to-moderate multilevel neural foraminal narrowing with slightly more severe narrowing on the right at C3-4 and C4-5. Upper chest: No acute abnormality in the upper chest or imaged lung apices. Other: Slightly heterogeneous enlarged thyroid with few punctate calcifications. IMPRESSION: 1. No acute intracranial abnormality. No scalp swelling or calvarial fracture. 2. 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 3. Chronic microvascular angiopathic changes and parenchymal volume loss, somewhat progressed since comparison studies. 4. Comminuted, impacted and anteriorly displaced fracture of the right mandibular condyle. No other mandibular fracture identified. Associated pre mandibular soft tissue swelling which likely correlates with site of reported laceration. 5. No other facial bone fracture. 6. No acute fracture or traumatic listhesis of the cervical spine. 7. Multilevel intervertebral disc height loss and spondylitic changes detailed above. 8. Heterogeneous, nodular thyroid. If further evaluation is warranted on a clinical basis following discussion of patient's risk factors and comorbidities, consider follow-up ultrasound. This follows consensus  guidelines: Managing Incidental Thyroid Nodules Detected on Imaging: White Paper of the ACR Incidental Thyroid Findings Committee. J Am Coll Radiol 2015; 12:143-150. and [facility] 3-tiered system for managing ITNs: J Am Coll Radiol. 2015; Feb;12(2): 143-50 Electronically Signed   By: Lovena Le M.D.   On: 05/03/2019 22:34   DG Shoulder Left  Result Date: 05/03/2019 CLINICAL DATA:  Fall with shoulder pain and deformity EXAM: LEFT SHOULDER - 2+ VIEW COMPARISON:  None. FINDINGS: AC joint is intact. Acute comminuted humeral neck fracture with close to 1 shaft diameter of displacement for the midline. Moderate valgus angulation of distal fracture fragment. Mildly displaced greater tuberosity fracture fragment. IMPRESSION: Acute displaced and angulated proximal humerus fracture involving the humeral neck and greater tuberosity. Electronically Signed   By: Donavan Foil M.D.   On: 05/03/2019 22:04   CT Maxillofacial WO CM  Result Date: 05/03/2019 CLINICAL DATA:  Fall, facial lacerations EXAM: CT HEAD WITHOUT CONTRAST CT MAXILLOFACIAL WITHOUT CONTRAST CT CERVICAL SPINE WITHOUT CONTRAST TECHNIQUE: Multidetector CT imaging of the head, cervical spine, and maxillofacial structures were performed using the standard protocol without intravenous contrast. Multiplanar CT image reconstructions of the cervical spine and maxillofacial structures were also generated. COMPARISON:  MR head 12/08/2012, CT head 08/16/2009 FINDINGS: CT HEAD FINDINGS Brain: Punctate foci of hypoattenuation the bilateral basal ganglia likely reflect sequela of remote lacunar infarcts several of which were present on comparison CT from 20/11. There mixed patchy and confluent areas of hypoattenuation throughout the subcortical deep and periventricular white matter, similar to slightly advanced from comparison in most compatible with chronic microvascular angiopathy. Symmetric prominence of the ventricles, cisterns and sulci compatible with parenchymal  volume loss. Slight ex vacuo dilatation of the ventricles. No convincing evidence of acute infarction, hemorrhage, frank hydrocephalus, extra-axial collection or mass lesion/mass effect. Vascular: Atherosclerotic calcification of the carotid siphons. No hyperdense vessel. Skull: No calvarial fracture, scalp swelling or hematoma. Other: None CT MAXILLOFACIAL FINDINGS Osseous: The osseous structures appear diffusely demineralized which may limit detection of small or nondisplaced fractures. No fracture of the bony orbits. Nasal bones are intact. No mid face fractures are seen. The pterygoid plates are intact. There is a comminuted fracture of the right mandibular condyle which appears impacted and slightly anteriorly displaced relative to the contralateral side. No other mandibular fracture is identified. The left temporomandibular joint is more normally aligned. 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 No temporal bone fractures are identified. There is complete absence of  the mandibular dentition. Maxillary dental bridge is noted with several dental implants. Orbits: The globes appear normal and symmetric with evidence of prior bilateral lens extractions. Symmetric appearance of the extraocular musculature and optic nerve sheath complexes. Normal caliber of the superior ophthalmic veins. Sinuses: Minimal thickening in the maxillary and ethmoidal sinuses. No air-fluid levels. Mastoid air cells are well aerated, better seen on CT images head. Middle ear cavities are clear. Ossicular chains appear normally configured. Soft tissues: Some pre mandibular soft tissue swelling and thickening is noted, compatible with the reported site of laceration. No other acute soft tissue abnormality is seen. CT CERVICAL SPINE FINDINGS Alignment: Slight exaggeration of the normal cervical lordosis is likely positional. Stabilization collar is not visualized at the time of exam. There is narrowing of the  basion dens interval with developing and no a pseudoarticulation which is a variant finding in can be seen in degenerative change. There is slight stepwise retrolisthesis of C3-C5 on a likely degenerative basis given spondylitic and facet changes at the involved levels. No traumatic listhesis. No abnormally widened, perched or jumped facets. Skull base and vertebrae: No acute fracture. No primary bone lesion or focal pathologic process. Soft tissues and spinal canal: No pre or paravertebral fluid or swelling. No visible canal hematoma. Disc levels: Multilevel intervertebral disc height loss with spondylitic endplate changes. Disc osteophyte complexes at the C2-C6 levels efface the ventral thecal sac with at most mild canal stenosis. Uncinate spurring and facet hypertrophic changes result in mild-to-moderate multilevel neural foraminal narrowing with slightly more severe narrowing on the right at C3-4 and C4-5. Upper chest: No acute abnormality in the upper chest or imaged lung apices. Other: Slightly heterogeneous enlarged thyroid with few punctate calcifications. IMPRESSION: 1. No acute intracranial abnormality. No scalp swelling or calvarial fracture. 2. 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 3. Chronic microvascular angiopathic changes and parenchymal volume loss, somewhat progressed since comparison studies. 4. Comminuted, impacted and anteriorly displaced fracture of the right mandibular condyle. No other mandibular fracture identified. Associated pre mandibular soft tissue swelling which likely correlates with site of reported laceration. 5. No other facial bone fracture. 6. No acute fracture or traumatic listhesis of the cervical spine. 7. Multilevel intervertebral disc height loss and spondylitic changes detailed above. 8. Heterogeneous, nodular thyroid. If further evaluation is warranted on a clinical basis following discussion of patient's risk factors and comorbidities, consider follow-up  ultrasound. This follows consensus guidelines: Managing Incidental Thyroid Nodules Detected on Imaging: White Paper of the ACR Incidental Thyroid Findings Committee. J Am Coll Radiol 2015; 12:143-150. and [facility] 3-tiered system for managing ITNs: J Am Coll Radiol. 2015; Feb;12(2): 143-50 Electronically Signed   By: Lovena Le M.D.   On: 05/03/2019 22:34    Review of Systems  Constitutional: Negative for appetite change and chills.  HENT: Negative for dental problem.   Respiratory: Negative for chest tightness.    Blood pressure (!) 155/93, pulse 98, temperature 98.3 F (36.8 C), temperature source Oral, resp. rate 18, height 5' 5.5" (1.664 m), weight 74.8 kg, last menstrual period 02/16/1975, SpO2 92 %. Physical Exam  Constitutional: She is oriented to person, place, and time. She appears well-developed and well-nourished.  HENT:  Head: Normocephalic.    Eyes: EOM are normal.  Cardiovascular: Normal rate.  Respiratory: Effort normal. No respiratory distress.  GI: Soft. She exhibits no distension.  Musculoskeletal:        General: Tenderness and edema present.  Neurological: She is alert and oriented to person,  place, and time.  Skin: Skin is warm.  Psychiatric: She has a normal mood and affect. Her behavior is normal. Thought content normal.    Assessment/Plan: Right mandible condyle fracture.    Recommend liquid diet for 4 weeks minimum.  I discussed with the patient absolutely no chewing.  If she can do this she can avoid surgery.  Surgery would require maxillomandibular fixation otherwise referred to as being wired shut.  I do not think this is a great idea for her at this age and she agrees.  She appears to be very willing to stick with a liquid diet.  She can put ice on the the right jaw for the next day and keep the bed elevated as able.  It may be more comfortable just to leave it alone for right now.  I am happy to follow-up in the office.  [place] 05/04/2019,  12:49 PM

## 2019-05-04 NOTE — Progress Notes (Deleted)
We are trying to confirm possible OR availability for ORIF later today. Please keep her NPO until further notice

## 2019-05-04 NOTE — Consult Note (Signed)
Reason for Consult: Left proximal humerus fracture Referring Physician: Junious Dresser, MD (Hospitalist)  [name] is an 86 y.o. female.  HPI: [name] is a 86 y.o. female with medical history significant of mild memory disorder, COPD lives in assisted living comes in after mechanical fall injuring her left upper arm.  Patient denies any recent illnesses.  She denies any loss of consciousness.  She reports left arm pain.  She suffered a lack to her chin and brow which has been repaired emergency department.  Patient found to have a left humerus fracture and a mandibular fracture both orthopedic surgery and ENT have been called who will see patient in the morning.  Her humerus will likely need surgery.  Patient be referred for admission for orthopedic injuries in a patient with multiple other medical problems.  Her pain is much better after receiving fentanyl in the emergency department.  She lives alone at her assisted living.  Fell face forward.  Complains of chest pain and jaw pain   Past Medical History:  Diagnosis Date  . Acute bronchitis 05/23/2011  . Acute upper respiratory infections of unspecified site 05/23/2011  . Arthritis   . Chronic airway obstruction, not elsewhere classified 05/23/2011  . Disturbance of salivary secretion 01/31/2011  . Dizziness and giddiness 01/31/2011  . Essential tremor 04/25/2014  . External hemorrhoids without mention of complication A999333  . Gait disorder 04/25/2014  . Insomnia, unspecified 09/12/2011  . Lumbago 01/31/2011  . Major depressive disorder, single episode, unspecified 01/31/2011  . Memory disorder 04/25/2014  . Mitral valve disorders(424.0) 01/31/2011  . Other and unspecified hyperlipidemia 01/31/2011  . Other convulsions 01/31/2011  . Other emphysema ([place]) 01/31/2011  . Pain in joint, site unspecified 01/31/2011  . Restless legs syndrome (RLS) 09/12/2011  . Retinal detachment with retinal defect of right eye 2011   right eye twice   . Senile osteoporosis 01/31/2011  . Spontaneous ecchymoses 01/31/2011  . Stiffness of joints, not elsewhere classified, multiple sites 01/31/2011  . Unspecified essential hypertension 01/31/2011    Past Surgical History:  Procedure Laterality Date  . ABDOMINAL HYSTERECTOMY  06/21/2003   TAH/BSO, omenectomy PSB resect, Stg IC cystadenofibroma  . CHOLECYSTECTOMY  2005   Dr. Marlou Starks  . ELBOW SURGERY Right 2008   broken   Dr. Apolonio Schneiders  . EYE SURGERY    . RETINAL DETACHMENT SURGERY N/A    two  . ROTATOR CUFF REPAIR Right 2012   Dr. Theda Sers  . SQUAMOUS CELL CARCINOMA EXCISION Bilateral 2012, 8/14   [place] on legs   Dr. Sarajane Jews  . TONSILLECTOMY  1941  . VIDEO BRONCHOSCOPY WITH ENDOBRONCHIAL NAVIGATION N/A 11/29/2015   Procedure: VIDEO BRONCHOSCOPY WITH ENDOBRONCHIAL NAVIGATION;  Surgeon: Collene Gobble, MD;  Location: [facility] OR;  Service: Thoracic;  Laterality: N/A;    Family History  Problem Relation Age of Onset  . Heart disease Father        CHF  . Cancer Mother        breast  . Seizures Sister     Social History:  reports that she quit smoking about 31 years ago. Her smoking use included cigarettes. She has a 40.00 pack-year smoking history. She has never used smokeless tobacco. She reports that she does not drink alcohol or use drugs.  Allergies:  Allergies  Allergen Reactions  . Latex Other (See Comments)    Swelling  . Sulfa Antibiotics Nausea And Vomiting  . Tetracyclines & Related     Medications: I have reviewed  the patient's current medications. Scheduled:   Results for orders placed or performed during the hospital encounter of 05/03/19 (from the past 24 hour(s))  Respiratory Panel by RT PCR (Flu A&B, Covid) - Nasopharyngeal Swab     Status: None   Collection Time: 05/03/19 11:30 PM   Specimen: Nasopharyngeal Swab  Result Value Ref Range   SARS Coronavirus 2 by RT PCR NEGATIVE NEGATIVE   Influenza A by PCR NEGATIVE NEGATIVE   Influenza B by PCR NEGATIVE NEGATIVE   Comprehensive metabolic panel     Status: Abnormal   Collection Time: 05/04/19 12:25 AM  Result Value Ref Range   Sodium 139 135 - 145 mmol/L   Potassium 3.5 3.5 - 5.1 mmol/L   Chloride 104 98 - 111 mmol/L   CO2 24 22 - 32 mmol/L   Glucose, Bld 132 (H) 70 - 99 mg/dL   BUN 31 (H) 8 - 23 mg/dL   Creatinine, Ser 0.96 0.44 - 1.00 mg/dL   Calcium 9.0 8.9 - 10.3 mg/dL   Total Protein 6.7 6.5 - 8.1 g/dL   Albumin 3.9 3.5 - 5.0 g/dL   AST 23 15 - 41 U/L   ALT 17 0 - 44 U/L   Alkaline Phosphatase 76 38 - 126 U/L   Total Bilirubin 1.1 0.3 - 1.2 mg/dL   GFR calc non Af Amer 54 (L) >60 mL/min   GFR calc Af Amer >60 >60 mL/min   Anion gap 11 5 - 15  CBC with Differential     Status: Abnormal   Collection Time: 05/04/19 12:25 AM  Result Value Ref Range   WBC 13.3 (H) 4.0 - 10.5 K/uL   RBC 4.44 3.87 - 5.11 MIL/uL   Hemoglobin 13.6 12.0 - 15.0 g/dL   HCT 41.6 36.0 - 46.0 %   MCV 93.7 80.0 - 100.0 fL   MCH 30.6 26.0 - 34.0 pg   MCHC 32.7 30.0 - 36.0 g/dL   RDW 12.9 11.5 - 15.5 %   Platelets 198 150 - 400 K/uL   nRBC 0.0 0.0 - 0.2 %   Neutrophils Relative % 84 %   Neutro Abs 11.3 (H) 1.7 - 7.7 K/uL   Lymphocytes Relative 5 %   Lymphs Abs 0.6 (L) 0.7 - 4.0 K/uL   Monocytes Relative 9 %   Monocytes Absolute 1.2 (H) 0.1 - 1.0 K/uL   Eosinophils Relative 0 %   Eosinophils Absolute 0.0 0.0 - 0.5 K/uL   Basophils Relative 1 %   Basophils Absolute 0.1 0.0 - 0.1 K/uL   Immature Granulocytes 1 %   Abs Immature Granulocytes 0.07 0.00 - 0.07 K/uL  Basic metabolic panel     Status: Abnormal   Collection Time: 05/04/19  4:49 AM  Result Value Ref Range   Sodium 139 135 - 145 mmol/L   Potassium 3.5 3.5 - 5.1 mmol/L   Chloride 104 98 - 111 mmol/L   CO2 24 22 - 32 mmol/L   Glucose, Bld 138 (H) 70 - 99 mg/dL   BUN 27 (H) 8 - 23 mg/dL   Creatinine, Ser 0.76 0.44 - 1.00 mg/dL   Calcium 8.9 8.9 - 10.3 mg/dL   GFR calc non Af Amer >60 >60 mL/min   GFR calc Af Amer >60 >60 mL/min   Anion gap 11 5  - 15  CBC     Status: None   Collection Time: 05/04/19  4:49 AM  Result Value Ref Range   WBC 10.5 4.0 - 10.5  K/uL   RBC 4.52 3.87 - 5.11 MIL/uL   Hemoglobin 13.8 12.0 - 15.0 g/dL   HCT 42.6 36.0 - 46.0 %   MCV 94.2 80.0 - 100.0 fL   MCH 30.5 26.0 - 34.0 pg   MCHC 32.4 30.0 - 36.0 g/dL   RDW 13.0 11.5 - 15.5 %   Platelets 203 150 - 400 K/uL   nRBC 0.0 0.0 - 0.2 %    X-ray: CLINICAL DATA:  Fall with shoulder pain and deformity  EXAM: LEFT SHOULDER - 2+ VIEW  COMPARISON:  None.  FINDINGS: AC joint is intact. Acute comminuted humeral neck fracture with close to 1 shaft diameter of displacement for the midline. Moderate valgus angulation of distal fracture fragment. Mildly displaced greater tuberosity fracture fragment.  IMPRESSION: Acute displaced and angulated proximal humerus fracture involving the humeral neck and greater tuberosity.   Electronically Signed   By: Donavan Foil M.D.  ROS  As per HPI otherwise 10 point review of systems negative.    Blood pressure (!) 155/93, pulse 98, temperature 98.3 F (36.8 C), temperature source Oral, resp. rate 18, height 5' 5.5" (1.664 m), weight 74.8 kg, last menstrual period 02/16/1975, SpO2 92 %.  Physical Exam  Awake alert very pleasant 86 yo female LUE in sling, NVI Pain over anterior chest wall after fall   GENERAL MEDICAL exam per admitting H&P: Eyes: PERRL, lids and conjunctivae normal ENMT: Mucous membranes are moist. Posterior pharynx clear of any exudate or lesions.Normal dentition.  Neck: normal, supple, no masses, no thyromegaly Respiratory: clear to auscultation bilaterally, no wheezing, no crackles. Normal respiratory effort. No accessory muscle use.  Cardiovascular: Regular rate and rhythm, no murmurs / rubs / gallops. No extremity edema. 2+ pedal pulses. No carotid bruits.  Abdomen: no tenderness, no masses palpated. No hepatosplenomegaly. Bowel sounds positive.  Musculoskeletal: no clubbing /  cyanosis. No joint deformity upper and lower extremities. Good ROM, no contractures. Normal muscle tone.  Except significant bruising and swelling to the left upper extremity currently in sling Skin: no rashes, lesions, ulcers. No induration Neurologic: CN 2-12 grossly intact. Sensation intact, DTR normal. Strength 5/5 in all 4.  Psychiatric: Normal judgment and insight. Alert and oriented x 3. Normal mood.    Assessment/Plan: 1. Left displaced proximal humerus fracture 2. Mandible fx  Plan: I have placed her in sling for comfort for now I have reviewed patient and findings with Dr. Onnie Graham who will likely get her fixed.  Timing of procedure to be determined. Based on conversation with patient she states that someone told her she may need a procedure for her mandible however no notes to confirm at this point Currently NPO but I have communicated plan with nursing to notify us if plans change and she is able to eat  Mauri Pole 05/04/2019, 7:36 AM

## 2019-05-04 NOTE — ED Notes (Signed)
Report called to floor for pt transfer. Receiving nurse Hollie Beach

## 2019-05-04 NOTE — Consult Note (Addendum)
PREOPERATIVE H&P  Chief Complaint: Left shoulder pain with facial trauma from mechanical fall  HPI: [name] is a very pleasant 86 year old female who sustained a mechanical fall in her independent living at a friend's home and sustained a left upper extremity fracture and facial laceration and mandible fracture.  She was admitted by medicine with plastic surgery consulting.  We were called for care regarding her left shoulder.  She is still very active with just some subtle early memory loss.  Her sister is in the room with her on her visit.   Past Medical History:  Diagnosis Date   Acute bronchitis 05/23/2011   Acute upper respiratory infections of unspecified site 05/23/2011   Arthritis    Chronic airway obstruction, not elsewhere classified 05/23/2011   Disturbance of salivary secretion 01/31/2011   Dizziness and giddiness 01/31/2011   Essential tremor 04/25/2014   External hemorrhoids without mention of complication [id number]   Gait disorder 04/25/2014   Insomnia, unspecified 09/12/2011   Lumbago 01/31/2011   Major depressive disorder, single episode, unspecified 01/31/2011   Memory disorder 04/25/2014   Mitral valve disorders(424.0) 01/31/2011   Other and unspecified hyperlipidemia 01/31/2011   Other convulsions 01/31/2011   Other emphysema ([place]) 01/31/2011   Pain in joint, site unspecified 01/31/2011   Restless legs syndrome (RLS) 09/12/2011   Retinal detachment with retinal defect of right eye 2011   right eye twice   Senile osteoporosis 01/31/2011   Spontaneous ecchymoses 01/31/2011   Stiffness of joints, not elsewhere classified, multiple sites 01/31/2011   Unspecified essential hypertension 01/31/2011   Past Surgical History:  Procedure Laterality Date   ABDOMINAL HYSTERECTOMY  06/21/2003   TAH/BSO, omenectomy PSB resect, Stg IC cystadenofibroma   CHOLECYSTECTOMY  2005   Dr. Marlou Starks   ELBOW SURGERY Right 2008   broken   Dr. Apolonio Schneiders   EYE SURGERY     RETINAL DETACHMENT SURGERY  N/A    two   ROTATOR CUFF REPAIR Right 2012   Dr. Freda Jackson CELL CARCINOMA EXCISION Bilateral 2012, 8/14   [place] on legs   Dr. 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  Tobacco Use   Smoking status: Former Smoker    Packs/day: 1.00    Years: 40.00    Pack years: 40.00    Types: Cigarettes    Quit date: 04/08/1988    Years since quitting: 31.0   Smokeless tobacco: Never Used   Tobacco comment: quit in 1990  Substance and Sexual Activity   Alcohol use: No    Alcohol/week: 0.0 standard drinks   Drug use: No   Sexual activity: Never  Other Topics Concern   Not on file  Social History Narrative   Lives at [place]   No children   Divorced   Exercise climbs steps 4 flights daily PT for [employer]   Alcohol none   Stopped smoking 1989   Patient drinks 3 large sodas daily.   Patient is right handed.   Social Determinants  of Health   Financial Resource Strain:    Difficulty of Paying Living Expenses: Not on file  Food Insecurity:    Worried About [place] in the Last Year: Not on file   Ran Out of Food in the Last Year: Not on file  Transportation Needs:    Lack of Transportation (Medical): Not on file   Lack of Transportation (Non-Medical): Not on file  Physical Activity:    Days of Exercise per Week: Not on file   Minutes of Exercise per Session: Not on file  Stress:    Feeling of Stress : Not on file  Social Connections:    Frequency of Communication with Friends and  Family: Not on file   Frequency of Social Gatherings with Friends and Family: Not on file   Attends Religious Services: Not on file   Active Member of Clubs or Organizations: Not on file   Attends Archivist Meetings: Not on file   Marital Status: Not on file   Family History  Problem Relation Age of Onset   Heart disease Father        CHF   Cancer Mother        breast   Seizures Sister    Allergies  Allergen Reactions   Latex Other (See Comments)    Swelling   Sulfa Antibiotics Nausea And Vomiting   Tetracyclines & Related    Prior to Admission medications   Medication Sig Start Date End Date Taking? Authorizing Provider  alendronate (FOSAMAX) 70 MG tablet Take 70 mg by mouth once a week. 03/26/19  Yes [provider]  aspirin 81 MG tablet Take 81 mg by mouth daily.   Yes [provider]  hydrochlorothiazide (MICROZIDE) 12.5 MG capsule Take 12.5 mg by mouth daily.   Yes [provider]  hydrocortisone 2.5 % cream Apply 1 application topically as needed (for psoriasis).  09/20/15  Yes [provider]  ketoconazole (NIZORAL) 2 % cream Apply 1 application topically as needed (for psoriasis).  09/20/15  Yes [provider]  lamoTRIgine (LAMICTAL) 150 MG tablet TAKE 1 TABLET BY MOUTH TWICE DAILY. Patient taking differently: Take 150 mg by mouth 2 (two) times daily.  10/21/18  Yes Suzzanne Cloud, NP  levETIRAcetam (KEPPRA) 500 MG tablet TAKE 1/2 TABLET IN THE AM AND 1 TABLET AT BEDTIME. Patient taking differently: Take 250-500 mg by mouth See admin instructions. TAKE 1/2 TABLET IN THE AM AND 1 TABLET AT BEDTIME. 10/21/18  Yes Suzzanne Cloud, NP  memantine (NAMENDA) 10 MG tablet TAKE 1 TABLET BY MOUTH TWICE DAILY. Patient taking differently: Take 10 mg by mouth 2 (two) times daily.  01/13/19  Yes Suzzanne Cloud, NP  pramipexole (MIRAPEX) 0.25 MG tablet Take 1 tablet (0.25 mg total) by mouth daily. 06/16/18  Yes Suzzanne Cloud, NP  PROAIR  HFA 108 (90 Base) MCG/ACT inhaler 2 PUFFS INTO THE LUNGS EVERY 4 HOURS IF NEEDED FOR WHEEZING/SHORTNESSOF BREATH. Patient taking differently: Inhale 2 puffs into the lungs every 4 (four) hours as needed for wheezing or shortness of breath.  03/31/17  Yes Parrett, Tammy S, NP  simvastatin (ZOCOR) 10 MG tablet Take 10 mg by mouth daily. Reported on 06/27/2015   Yes [provider]  tiotropium (SPIRIVA HANDIHALER) 18 MCG inhalation capsule INHALE CONTENTS OF ONE CAPSULE ONCE DAILY FOR COPD. Patient taking differently: Place 18 mcg into inhaler and inhale daily.  10/02/17  Yes Brand Males, MD  cholecalciferol (VITAMIN  D) 1000 units tablet Take 1 tablet (1,000 Units total) by mouth daily. Patient not taking: Reported on 05/03/2019 11/29/15   Collene Gobble, MD       All other systems have been reviewed and were otherwise negative with the exception of those mentioned in the HPI and as above.  Physical Exam: Vitals:   05/04/19 0454 05/04/19 1345  BP: (!) 155/93 (!) 153/92  Pulse: 98 100  Resp: 18 16  Temp: 98.3 F (36.8 C) 98.9 F (37.2 C)  SpO2: 92% 91%    General: Alert, no acute distress Cardiovascular: No pedal edema Respiratory: No cyanosis, no use of accessory musculature GI: No organomegaly, abdomen is soft and non-tender Skin: No lesions in the area of chief complaint Neurologic: Sensation intact distally Psychiatric: Patient is competent for consent with normal mood and affect Lymphatic: No axillary or cervical lymphadenopathy  MUSCULOSKELETAL:  Examination of her left upper extremity shows significant bruising and swelling drifting distally from her shoulder into her upper arm.  She is immobilized in a sling.  She is neurovascularly intact distally.  She is also in tact to light touch about the left upper extremity.  Radiographs  Radiographs of the left upper extremity were reviewed.  These films demonstrate a comminuted and severely displaced four-part  proximal humerus fracture.  Assessment/Plan: 1. Left proximal humeral neck fracture with significant displacement and comminution 2.  Right mandible condyle fracture  We have spoken with the patient and her sister regarding her left upper extremity injury and have discussed treatment options.  Given the amount of displacement a left shoulder reverse arthroplasty would be the indicated procedure.  We would make plans to do this on Thursday.  We have spoken with Dr. Marla Roe regarding the mandibular condyle fracture and appropriate management during intubation.  She recommended that with the intubation procedure the mandible be protected and avoid any type of jaw thrust or other forceful manipulation of the mandible.  She will be available by telephone if the anesthesia service has any questions regarding intubation technique in this particular situation. The risks benefits and treatment options were reviewed including possible complications of bleeding, infection, neurovascular injury, ongoing pain, anesthetic complication, and possible need for additional surgery. The patient understands, accepts and agrees to proceed with the planned procedure.  Jenetta Loges, PA-C for Dr. Justice Britain 05/04/2019 3:12 PM Contact # [phone]

## 2019-05-04 NOTE — Progress Notes (Signed)
We will wait for ENT to evaluate her mandible fracture to determine how it might impact her anesthesia.   We will then work towards performing a reverse arthroplasty for her fracture later in the week.   We will be by to see her later in the day.

## 2019-05-04 NOTE — H&P (Signed)
History and Physical    [name] [name] DOB: 02-18-1933 DOA: 05/03/2019  PCP: Leighton Ruff, MD  Patient coming from: Assisted living  Chief Complaint: Fall  HPI: [name] is a 86 y.o. female with medical history significant of mild memory disorder, COPD lives in assisted living comes in after mechanical fall injuring her left upper arm.  Patient denies any recent illnesses.  She denies any loss of consciousness.  She reports left arm pain.  She suffered a lack to her chin and brow which has been repaired emergency department.  Patient found to have a left humerus fracture and a mandibular fracture both orthopedic surgery and ENT have been called who will see patient in the morning.  Her humerus will likely need surgery.  Patient be referred for admission for orthopedic injuries in a patient with multiple other medical problems.  Her pain is much better after receiving fentanyl in the emergency department.  She lives alone at her assisted living.  Review of Systems: As per HPI otherwise 10 point review of systems negative.   Past Medical History:  Diagnosis Date  . Acute bronchitis 05/23/2011  . Acute upper respiratory infections of unspecified site 05/23/2011  . Arthritis   . Chronic airway obstruction, not elsewhere classified 05/23/2011  . Disturbance of salivary secretion 01/31/2011  . Dizziness and giddiness 01/31/2011  . Essential tremor 04/25/2014  . External hemorrhoids without mention of complication [id number]  . Gait disorder 04/25/2014  . Insomnia, unspecified 09/12/2011  . Lumbago 01/31/2011  . Major depressive disorder, single episode, unspecified 01/31/2011  . Memory disorder 04/25/2014  . Mitral valve disorders(424.0) 01/31/2011  . Other and unspecified hyperlipidemia 01/31/2011  . Other convulsions 01/31/2011  . Other emphysema ([place]) 01/31/2011  . Pain in joint, site unspecified 01/31/2011  . Restless legs syndrome (RLS) 09/12/2011  . 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 Senile osteoporosis 01/31/2011  . Spontaneous ecchymoses 01/31/2011  . Stiffness of joints, not elsewhere classified, multiple sites 01/31/2011  . Unspecified essential hypertension 01/31/2011    Past Surgical History:  Procedure Laterality Date  . ABDOMINAL HYSTERECTOMY  06/21/2003   TAH/BSO, omenectomy PSB resect, Stg IC cystadenofibroma  . CHOLECYSTECTOMY  2005   Dr. Marlou Starks  . ELBOW SURGERY Right 2008   broken   Dr. Apolonio Schneiders  . EYE SURGERY    . RETINAL DETACHMENT SURGERY N/A    two  . ROTATOR CUFF REPAIR Right 2012   Dr. Theda Sers  . SQUAMOUS CELL CARCINOMA EXCISION Bilateral 2012, 8/14   [place] on legs   Dr. Sarajane Jews  . TONSILLECTOMY  1941  . VIDEO BRONCHOSCOPY WITH ENDOBRONCHIAL NAVIGATION N/A 11/29/2015   Procedure: VIDEO BRONCHOSCOPY WITH ENDOBRONCHIAL NAVIGATION;  Surgeon: Collene Gobble, MD;  Location: [facility];  Service: Thoracic;  Laterality: N/A;     reports that she quit smoking about 31 years ago. Her smoking use included cigarettes. She has a 40.00 pack-year smoking history. She has never used smokeless tobacco. She reports that she does not drink alcohol or use drugs.  Allergies  Allergen Reactions  . Latex Other (See Comments)    Swelling  . Sulfa Antibiotics Nausea And Vomiting  . Tetracyclines & Related     Family History  Problem Relation Age of Onset  . Heart disease Father        CHF  . Cancer Mother        breast  . Seizures Sister  Prior to Admission medications   Medication Sig Start Date End Date Taking? Authorizing Provider  alendronate (FOSAMAX) 70 MG tablet Take 70 mg by mouth once a week. 03/26/19  Yes [provider]  aspirin 81 MG tablet Take 81 mg by mouth daily.   Yes [provider]  hydrochlorothiazide (MICROZIDE) 12.5 MG capsule Take 12.5 mg by mouth daily.   Yes [provider]  hydrocortisone 2.5 % cream Apply 1 application topically as  needed (for psoriasis).  09/20/15  Yes [provider]  ketoconazole (NIZORAL) 2 % cream Apply 1 application topically as needed (for psoriasis).  09/20/15  Yes [provider]  lamoTRIgine (LAMICTAL) 150 MG tablet TAKE 1 TABLET BY MOUTH TWICE DAILY. Patient taking differently: Take 150 mg by mouth 2 (two) times daily.  10/21/18  Yes Suzzanne Cloud, NP  levETIRAcetam (KEPPRA) 500 MG tablet TAKE 1/2 TABLET IN THE AM AND 1 TABLET AT BEDTIME. Patient taking differently: Take 250-500 mg by mouth See admin instructions. TAKE 1/2 TABLET IN THE AM AND 1 TABLET AT BEDTIME. 10/21/18  Yes Suzzanne Cloud, NP  memantine (NAMENDA) 10 MG tablet TAKE 1 TABLET BY MOUTH TWICE DAILY. Patient taking differently: Take 10 mg by mouth 2 (two) times daily.  01/13/19  Yes Suzzanne Cloud, NP  pramipexole (MIRAPEX) 0.25 MG tablet Take 1 tablet (0.25 mg total) by mouth daily. 06/16/18  Yes Suzzanne Cloud, NP  PROAIR HFA 108 (90 Base) MCG/ACT inhaler 2 PUFFS INTO THE LUNGS EVERY 4 HOURS IF NEEDED FOR WHEEZING/SHORTNESSOF BREATH. Patient taking differently: Inhale 2 puffs into the lungs every 4 (four) hours as needed for wheezing or shortness of breath.  03/31/17  Yes Parrett, Tammy S, NP  simvastatin (ZOCOR) 10 MG tablet Take 10 mg by mouth daily. Reported on 06/27/2015   Yes [provider]  tiotropium (SPIRIVA HANDIHALER) 18 MCG inhalation capsule INHALE CONTENTS OF ONE CAPSULE ONCE DAILY FOR COPD. Patient taking differently: Place 18 mcg into inhaler and inhale daily.  10/02/17  Yes Brand Males, MD  cholecalciferol (VITAMIN D) 1000 units tablet Take 1 tablet (1,000 Units total) by mouth daily. Patient not taking: Reported on 05/03/2019 11/29/15   Collene Gobble, MD    Physical Exam: Vitals:   05/03/19 2103 05/03/19 2122 05/04/19 0004 05/04/19 0200  BP:  (!) 159/99 120/75 125/86  Pulse:  (!) 107 99 (!) 101  Resp:  16 15 19   SpO2:  94% 91% 91%  Weight: 74.8 kg     Height: 5' 5.5" (1.664 m)           Constitutional: NAD, calm, comfortable talking fully with no jaw pain Vitals:   05/03/19 2103 05/03/19 2122 05/04/19 0004 05/04/19 0200  BP:  (!) 159/99 120/75 125/86  Pulse:  (!) 107 99 (!) 101  Resp:  16 15 19   SpO2:  94% 91% 91%  Weight: 74.8 kg     Height: 5' 5.5" (1.664 m)      Eyes: PERRL, lids and conjunctivae normal ENMT: Mucous membranes are moist. Posterior pharynx clear of any exudate or lesions.Normal dentition.  Neck: normal, supple, no masses, no thyromegaly Respiratory: clear to auscultation bilaterally, no wheezing, no crackles. Normal respiratory effort. No accessory muscle use.  Cardiovascular: Regular rate and rhythm, no murmurs / rubs / gallops. No extremity edema. 2+ pedal pulses. No carotid bruits.  Abdomen: no tenderness, no masses palpated. No hepatosplenomegaly. Bowel sounds positive.  Musculoskeletal: no clubbing / cyanosis. No joint deformity upper  and lower extremities. Good ROM, no contractures. Normal muscle tone.  Except significant bruising and swelling to the left upper extremity currently in sling Skin: no rashes, lesions, ulcers. No induration Neurologic: CN 2-12 grossly intact. Sensation intact, DTR normal. Strength 5/5 in all 4.  Psychiatric: Normal judgment and insight. Alert and oriented x 3. Normal mood.    Labs on Admission: I have personally reviewed following labs and imaging studies  CBC: Recent Labs  Lab 05/04/19 0025  WBC 13.3*  NEUTROABS 11.3*  HGB 13.6  HCT 41.6  MCV 93.7  PLT 99991111   Basic Metabolic Panel: Recent Labs  Lab 05/04/19 0025  NA 139  K 3.5  CL 104  CO2 24  GLUCOSE 132*  BUN 31*  CREATININE 0.96  CALCIUM 9.0   GFR: Estimated Creatinine Clearance: 44.6 mL/min (by C-G formula based on SCr of 0.96 mg/dL). Liver Function Tests: Recent Labs  Lab 05/04/19 0025  AST 23  ALT 17  ALKPHOS 76  BILITOT 1.1  PROT 6.7  ALBUMIN 3.9   No results for input(s): LIPASE, AMYLASE in the last 168 hours. No  results for input(s): AMMONIA in the last 168 hours. Coagulation Profile: No results for input(s): INR, PROTIME in the last 168 hours. Cardiac Enzymes: No results for input(s): CKTOTAL, CKMB, CKMBINDEX, TROPONINI in the last 168 hours. BNP (last 3 results) No results for input(s): PROBNP in the last 8760 hours. HbA1C: No results for input(s): HGBA1C in the last 72 hours. CBG: No results for input(s): GLUCAP in the last 168 hours. Lipid Profile: No results for input(s): CHOL, HDL, LDLCALC, TRIG, CHOLHDL, LDLDIRECT in the last 72 hours. Thyroid Function Tests: No results for input(s): TSH, T4TOTAL, FREET4, T3FREE, THYROIDAB in the last 72 hours. Anemia Panel: No results for input(s): VITAMINB12, FOLATE, FERRITIN, TIBC, IRON, RETICCTPCT in the last 72 hours. Urine analysis:    Component Value Date/Time   COLORURINE YELLOW 08/16/2009 1549   APPEARANCEUR CLOUDY (A) 08/16/2009 1549   LABSPEC 1.017 08/16/2009 1549   PHURINE 6.0 08/16/2009 1549   GLUCOSEU NEGATIVE 08/16/2009 1549   HGBUR NEGATIVE 08/16/2009 1549   BILIRUBINUR neg 06/08/2013 1019   KETONESUR 15 (A) 08/16/2009 1549   PROTEINUR neg 06/08/2013 1019   PROTEINUR NEGATIVE 08/16/2009 1549   UROBILINOGEN negative 06/08/2013 1019   UROBILINOGEN 0.2 08/16/2009 1549   NITRITE neg 06/08/2013 1019   NITRITE NEGATIVE 08/16/2009 1549   LEUKOCYTESUR Negative 06/08/2013 1019   Sepsis Labs: !!!!!!!!!!!!!!!!!!!!!!!!!!!!!!!!!!!!!!!!!!!! @LABRCNTIP (procalcitonin:4,lacticidven:4) ) Recent Results (from the past 240 hour(s))  Respiratory Panel by RT PCR (Flu A&B, Covid) - Nasopharyngeal Swab     Status: None   Collection Time: 05/03/19 11:30 PM   Specimen: Nasopharyngeal Swab  Result Value Ref Range Status   SARS Coronavirus 2 by RT PCR NEGATIVE NEGATIVE Final    Comment: (NOTE) SARS-CoV-2 target nucleic acids are NOT DETECTED. The SARS-CoV-2 RNA is generally detectable in upper respiratoy specimens during the acute phase of  infection. The lowest concentration of SARS-CoV-2 viral copies this assay can detect is 131 copies/mL. A negative result does not preclude SARS-Cov-2 infection and should not be used as the sole basis for treatment or other patient management decisions. A negative result may occur with  improper specimen collection/handling, submission of specimen other than nasopharyngeal swab, presence of viral mutation(s) within the areas targeted by this assay, and inadequate number of viral copies (<131 copies/mL). A negative result must be combined with clinical observations, patient history, and epidemiological information. The expected result is Negative.  Fact Sheet for Patients:  PinkCheek.be Fact Sheet for Healthcare Providers:  GravelBags.it This test is not yet ap proved or cleared by the [place] FDA and  has been authorized for detection and/or diagnosis of SARS-CoV-2 by FDA under an Emergency Use Authorization (EUA). This EUA will remain  in effect (meaning this test can be used) for the duration of the COVID-19 declaration under Section 564(b)(1) of the Act, 21 U.S.C. section 360bbb-3(b)(1), unless the authorization is terminated or revoked sooner.    Influenza A by PCR NEGATIVE NEGATIVE Final   Influenza B by PCR NEGATIVE NEGATIVE Final    Comment: (NOTE) The Xpert Xpress SARS-CoV-2/FLU/RSV assay is intended as an aid in  the diagnosis of influenza from Nasopharyngeal swab specimens and  should not be used as a sole basis for treatment. Nasal washings and  aspirates are unacceptable for Xpert Xpress SARS-CoV-2/FLU/RSV  testing. Fact Sheet for Patients: PinkCheek.be Fact Sheet for Healthcare Providers: GravelBags.it This test is not yet approved or cleared by the [place] FDA and  has been authorized for detection and/or diagnosis of SARS-CoV-2 by  FDA under  an Emergency Use Authorization (EUA). This EUA will remain  in effect (meaning this test can be used) for the duration of the  Covid-19 declaration under Section 564(b)(1) of the Act, 21  U.S.C. section 360bbb-3(b)(1), unless the authorization is  terminated or revoked. Performed at [facility], [place] [address], [place], [place] [address]      Radiological Exams on Admission: DG Chest 2 View  Result Date: 05/03/2019 CLINICAL DATA:  Chest pain after fall EXAM: CHEST - 2 VIEW COMPARISON:  11/29/2015 FINDINGS: Low lung volumes with linear scarring or atelectasis at the bases. Stable cardiomediastinal silhouette with aortic atherosclerosis. No pneumothorax. Displaced fracture involving the proximal left humerus. IMPRESSION: 1. Low lung volumes with atelectasis or scarring at the bases 2. Displaced proximal left humerus fracture Electronically Signed   By: Donavan Foil M.D.   On: 05/03/2019 22:02   CT Head Wo Contrast  Result Date: 05/03/2019 CLINICAL DATA:  Fall, facial lacerations EXAM: CT HEAD WITHOUT CONTRAST CT MAXILLOFACIAL WITHOUT CONTRAST CT CERVICAL SPINE WITHOUT CONTRAST TECHNIQUE: Multidetector CT imaging of the head, cervical spine, and maxillofacial structures were performed using the standard protocol without intravenous contrast. Multiplanar CT image reconstructions of the cervical spine and maxillofacial structures were also generated. COMPARISON:  MR head 12/08/2012, CT head 08/16/2009 FINDINGS: CT HEAD FINDINGS Brain: Punctate foci of hypoattenuation the bilateral basal ganglia likely reflect sequela of remote lacunar infarcts several of which were present on comparison CT from 20/11. There mixed patchy and confluent areas of hypoattenuation throughout the subcortical deep and periventricular white matter, similar to slightly advanced from comparison in most compatible with chronic microvascular angiopathy. Symmetric prominence of the ventricles, cisterns and sulci  compatible with parenchymal volume loss. Slight ex vacuo dilatation of the ventricles. No convincing evidence of acute infarction, hemorrhage, frank hydrocephalus, extra-axial collection or mass lesion/mass effect. Vascular: Atherosclerotic calcification of the carotid siphons. No hyperdense vessel. Skull: No calvarial fracture, scalp swelling or hematoma. Other: None CT MAXILLOFACIAL FINDINGS Osseous: The osseous structures appear diffusely demineralized which may limit detection of small or nondisplaced fractures. No fracture of the bony orbits. Nasal bones are intact. No mid face fractures are seen. The pterygoid plates are intact. There is a comminuted fracture of the right mandibular condyle which appears impacted and slightly anteriorly displaced relative to the contralateral side. No other mandibular fracture is identified. The left  temporomandibular joint is more normally aligned. 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 No temporal bone fractures are identified. There is complete absence of the mandibular dentition. Maxillary dental bridge is noted with several dental implants. Orbits: The globes appear normal and symmetric with evidence of prior bilateral lens extractions. Symmetric appearance of the extraocular musculature and optic nerve sheath complexes. Normal caliber of the superior ophthalmic veins. Sinuses: Minimal thickening in the maxillary and ethmoidal sinuses. No air-fluid levels. Mastoid air cells are well aerated, better seen on CT images head. Middle ear cavities are clear. Ossicular chains appear normally configured. Soft tissues: Some pre mandibular soft tissue swelling and thickening is noted, compatible with the reported site of laceration. No other acute soft tissue abnormality is seen. CT CERVICAL SPINE FINDINGS Alignment: Slight exaggeration of the normal cervical lordosis is likely positional. Stabilization collar is not visualized at the time of  exam. There is narrowing of the basion dens interval with developing and no a pseudoarticulation which is a variant finding in can be seen in degenerative change. There is slight stepwise retrolisthesis of C3-C5 on a likely degenerative basis given spondylitic and facet changes at the involved levels. No traumatic listhesis. No abnormally widened, perched or jumped facets. Skull base and vertebrae: No acute fracture. No primary bone lesion or focal pathologic process. Soft tissues and spinal canal: No pre or paravertebral fluid or swelling. No visible canal hematoma. Disc levels: Multilevel intervertebral disc height loss with spondylitic endplate changes. Disc osteophyte complexes at the C2-C6 levels efface the ventral thecal sac with at most mild canal stenosis. Uncinate spurring and facet hypertrophic changes result in mild-to-moderate multilevel neural foraminal narrowing with slightly more severe narrowing on the right at C3-4 and C4-5. Upper chest: No acute abnormality in the upper chest or imaged lung apices. Other: Slightly heterogeneous enlarged thyroid with few punctate calcifications. IMPRESSION: 1. No acute intracranial abnormality. No scalp swelling or calvarial fracture. 2. 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 3. Chronic microvascular angiopathic changes and parenchymal volume loss, somewhat progressed since comparison studies. 4. Comminuted, impacted and anteriorly displaced fracture of the right mandibular condyle. No other mandibular fracture identified. Associated pre mandibular soft tissue swelling which likely correlates with site of reported laceration. 5. No other facial bone fracture. 6. No acute fracture or traumatic listhesis of the cervical spine. 7. Multilevel intervertebral disc height loss and spondylitic changes detailed above. 8. Heterogeneous, nodular thyroid. If further evaluation is warranted on a clinical basis following discussion of patient's risk factors and  comorbidities, consider follow-up ultrasound. This follows consensus guidelines: Managing Incidental Thyroid Nodules Detected on Imaging: White Paper of the ACR Incidental Thyroid Findings Committee. J Am Coll Radiol 2015; 12:143-150. and [facility] 3-tiered system for managing ITNs: J Am Coll Radiol. 2015; Feb;12(2): 143-50 Electronically Signed   By: Lovena Le M.D.   On: 05/03/2019 22:34   CT Cervical Spine Wo Contrast  Result Date: 05/03/2019 CLINICAL DATA:  Fall, facial lacerations EXAM: CT HEAD WITHOUT CONTRAST CT MAXILLOFACIAL WITHOUT CONTRAST CT CERVICAL SPINE WITHOUT CONTRAST TECHNIQUE: Multidetector CT imaging of the head, cervical spine, and maxillofacial structures were performed using the standard protocol without intravenous contrast. Multiplanar CT image reconstructions of the cervical spine and maxillofacial structures were also generated. COMPARISON:  MR head 12/08/2012, CT head 08/16/2009 FINDINGS: CT HEAD FINDINGS Brain: Punctate foci of hypoattenuation the bilateral basal ganglia likely reflect sequela of remote lacunar infarcts several of which were present on comparison CT from 20/11. There mixed  patchy and confluent areas of hypoattenuation throughout the subcortical deep and periventricular white matter, similar to slightly advanced from comparison in most compatible with chronic microvascular angiopathy. Symmetric prominence of the ventricles, cisterns and sulci compatible with parenchymal volume loss. Slight ex vacuo dilatation of the ventricles. No convincing evidence of acute infarction, hemorrhage, frank hydrocephalus, extra-axial collection or mass lesion/mass effect. Vascular: Atherosclerotic calcification of the carotid siphons. No hyperdense vessel. Skull: No calvarial fracture, scalp swelling or hematoma. Other: None CT MAXILLOFACIAL FINDINGS Osseous: The osseous structures appear diffusely demineralized which may limit detection of small or nondisplaced fractures. No fracture of  the bony orbits. Nasal bones are intact. No mid face fractures are seen. The pterygoid plates are intact. There is a comminuted fracture of the right mandibular condyle which appears impacted and slightly anteriorly displaced relative to the contralateral side. No other mandibular fracture is identified. The left temporomandibular joint is more normally aligned. 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 No temporal bone fractures are identified. There is complete absence of the mandibular dentition. Maxillary dental bridge is noted with several dental implants. Orbits: The globes appear normal and symmetric with evidence of prior bilateral lens extractions. Symmetric appearance of the extraocular musculature and optic nerve sheath complexes. Normal caliber of the superior ophthalmic veins. Sinuses: Minimal thickening in the maxillary and ethmoidal sinuses. No air-fluid levels. Mastoid air cells are well aerated, better seen on CT images head. Middle ear cavities are clear. Ossicular chains appear normally configured. Soft tissues: Some pre mandibular soft tissue swelling and thickening is noted, compatible with the reported site of laceration. No other acute soft tissue abnormality is seen. CT CERVICAL SPINE FINDINGS Alignment: Slight exaggeration of the normal cervical lordosis is likely positional. Stabilization collar is not visualized at the time of exam. There is narrowing of the basion dens interval with developing and no a pseudoarticulation which is a variant finding in can be seen in degenerative change. There is slight stepwise retrolisthesis of C3-C5 on a likely degenerative basis given spondylitic and facet changes at the involved levels. No traumatic listhesis. No abnormally widened, perched or jumped facets. Skull base and vertebrae: No acute fracture. No primary bone lesion or focal pathologic process. Soft tissues and spinal canal: No pre or paravertebral fluid or  swelling. No visible canal hematoma. Disc levels: Multilevel intervertebral disc height loss with spondylitic endplate changes. Disc osteophyte complexes at the C2-C6 levels efface the ventral thecal sac with at most mild canal stenosis. Uncinate spurring and facet hypertrophic changes result in mild-to-moderate multilevel neural foraminal narrowing with slightly more severe narrowing on the right at C3-4 and C4-5. Upper chest: No acute abnormality in the upper chest or imaged lung apices. Other: Slightly heterogeneous enlarged thyroid with few punctate calcifications. IMPRESSION: 1. No acute intracranial abnormality. No scalp swelling or calvarial fracture. 2. 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 3. Chronic microvascular angiopathic changes and parenchymal volume loss, somewhat progressed since comparison studies. 4. Comminuted, impacted and anteriorly displaced fracture of the right mandibular condyle. No other mandibular fracture identified. Associated pre mandibular soft tissue swelling which likely correlates with site of reported laceration. 5. No other facial bone fracture. 6. No acute fracture or traumatic listhesis of the cervical spine. 7. Multilevel intervertebral disc height loss and spondylitic changes detailed above. 8. Heterogeneous, nodular thyroid. If further evaluation is warranted on a clinical basis following discussion of patient's risk factors and comorbidities, consider follow-up ultrasound. This follows consensus guidelines: Managing Incidental Thyroid Nodules  Detected on Imaging: White Paper of the ACR Incidental Thyroid Findings Committee. J Am Coll Radiol 2015; 12:143-150. and [facility] 3-tiered system for managing ITNs: J Am Coll Radiol. 2015; Feb;12(2): 143-50 Electronically Signed   By: Lovena Le M.D.   On: 05/03/2019 22:34   DG Shoulder Left  Result Date: 05/03/2019 CLINICAL DATA:  Fall with shoulder pain and deformity EXAM: LEFT SHOULDER - 2+ VIEW COMPARISON:   None. FINDINGS: AC joint is intact. Acute comminuted humeral neck fracture with close to 1 shaft diameter of displacement for the midline. Moderate valgus angulation of distal fracture fragment. Mildly displaced greater tuberosity fracture fragment. IMPRESSION: Acute displaced and angulated proximal humerus fracture involving the humeral neck and greater tuberosity. Electronically Signed   By: Donavan Foil M.D.   On: 05/03/2019 22:04   CT Maxillofacial WO CM  Result Date: 05/03/2019 CLINICAL DATA:  Fall, facial lacerations EXAM: CT HEAD WITHOUT CONTRAST CT MAXILLOFACIAL WITHOUT CONTRAST CT CERVICAL SPINE WITHOUT CONTRAST TECHNIQUE: Multidetector CT imaging of the head, cervical spine, and maxillofacial structures were performed using the standard protocol without intravenous contrast. Multiplanar CT image reconstructions of the cervical spine and maxillofacial structures were also generated. COMPARISON:  MR head 12/08/2012, CT head 08/16/2009 FINDINGS: CT HEAD FINDINGS Brain: Punctate foci of hypoattenuation the bilateral basal ganglia likely reflect sequela of remote lacunar infarcts several of which were present on comparison CT from 20/11. There mixed patchy and confluent areas of hypoattenuation throughout the subcortical deep and periventricular white matter, similar to slightly advanced from comparison in most compatible with chronic microvascular angiopathy. Symmetric prominence of the ventricles, cisterns and sulci compatible with parenchymal volume loss. Slight ex vacuo dilatation of the ventricles. No convincing evidence of acute infarction, hemorrhage, frank hydrocephalus, extra-axial collection or mass lesion/mass effect. Vascular: Atherosclerotic calcification of the carotid siphons. No hyperdense vessel. Skull: No calvarial fracture, scalp swelling or hematoma. Other: None CT MAXILLOFACIAL FINDINGS Osseous: The osseous structures appear diffusely demineralized which may limit detection of small or  nondisplaced fractures. No fracture of the bony orbits. Nasal bones are intact. No mid face fractures are seen. The pterygoid plates are intact. There is a comminuted fracture of the right mandibular condyle which appears impacted and slightly anteriorly displaced relative to the contralateral side. No other mandibular fracture is identified. The left temporomandibular joint is more normally aligned. 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 No temporal bone fractures are identified. There is complete absence of the mandibular dentition. Maxillary dental bridge is noted with several dental implants. Orbits: The globes appear normal and symmetric with evidence of prior bilateral lens extractions. Symmetric appearance of the extraocular musculature and optic nerve sheath complexes. Normal caliber of the superior ophthalmic veins. Sinuses: Minimal thickening in the maxillary and ethmoidal sinuses. No air-fluid levels. Mastoid air cells are well aerated, better seen on CT images head. Middle ear cavities are clear. Ossicular chains appear normally configured. Soft tissues: Some pre mandibular soft tissue swelling and thickening is noted, compatible with the reported site of laceration. No other acute soft tissue abnormality is seen. CT CERVICAL SPINE FINDINGS Alignment: Slight exaggeration of the normal cervical lordosis is likely positional. Stabilization collar is not visualized at the time of exam. There is narrowing of the basion dens interval with developing and no a pseudoarticulation which is a variant finding in can be seen in degenerative change. There is slight stepwise retrolisthesis of C3-C5 on a likely degenerative basis given spondylitic and facet changes at the involved levels. No traumatic  listhesis. No abnormally widened, perched or jumped facets. Skull base and vertebrae: No acute fracture. No primary bone lesion or focal pathologic process. Soft tissues and spinal  canal: No pre or paravertebral fluid or swelling. No visible canal hematoma. Disc levels: Multilevel intervertebral disc height loss with spondylitic endplate changes. Disc osteophyte complexes at the C2-C6 levels efface the ventral thecal sac with at most mild canal stenosis. Uncinate spurring and facet hypertrophic changes result in mild-to-moderate multilevel neural foraminal narrowing with slightly more severe narrowing on the right at C3-4 and C4-5. Upper chest: No acute abnormality in the upper chest or imaged lung apices. Other: Slightly heterogeneous enlarged thyroid with few punctate calcifications. IMPRESSION: 1. No acute intracranial abnormality. No scalp swelling or calvarial fracture. 2. 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 3. Chronic microvascular angiopathic changes and parenchymal volume loss, somewhat progressed since comparison studies. 4. Comminuted, impacted and anteriorly displaced fracture of the right mandibular condyle. No other mandibular fracture identified. Associated pre mandibular soft tissue swelling which likely correlates with site of reported laceration. 5. No other facial bone fracture. 6. No acute fracture or traumatic listhesis of the cervical spine. 7. Multilevel intervertebral disc height loss and spondylitic changes detailed above. 8. Heterogeneous, nodular thyroid. If further evaluation is warranted on a clinical basis following discussion of patient's risk factors and comorbidities, consider follow-up ultrasound. This follows consensus guidelines: Managing Incidental Thyroid Nodules Detected on Imaging: White Paper of the ACR Incidental Thyroid Findings Committee. J Am Coll Radiol 2015; 12:143-150. and [facility] 3-tiered system for managing ITNs: J Am Coll Radiol. 2015; Feb;12(2): 143-50 Electronically Signed   By: Lovena Le M.D.   On: 05/03/2019 22:34   Old chart reviewed Case discussed with EDP  Assessment/Plan 86 year old female with mechanical fall  found to have a angulated left humeral fracture with also a mandibular fracture Principal Problem:   Humerus fracture left displaced and angulated at humeral neck mandibular fracture.  Keep n.p.o. for surgical intervention on arm at some point.  Currently in sling.  Has significant amount of bruising and swelling but pulses intact.  ENT also called will see patient in the morning concerning her mandibular fracture.  Hold anticoagulants and antiplatelets at this time.  As needed fentanyl and Zofran ordered.  Active Problems:   Fall-mechanical as above    COPD (chronic obstructive pulmonary disease) (HCC)-stable lungs clear    Essential hypertension-clarify resume home meds when able to tolerate p.o.     DVT prophylaxis: SCDs Code Status: DNR Family Communication: None Disposition Plan: Days Consults called: Orthopedic surgery and ENT Admission status: Admission   Jin Shockley A MD [facility]  If 7PM-7AM, please contact night-coverage [email or web address] Password [email or web address]  05/04/2019, 3:30 AM

## 2019-05-04 NOTE — ED Provider Notes (Signed)
1:50 AM Patient to be admitted to the hospitalist service.  She sustained a left humeral fracture secondary to a fall.  CT also reveals fracture of the right mandibular condyle.  Plan for inpatient orthopedic and ENT trauma consultation.  Dr. Shanon Brow of [facility] to admit.   Antonietta Breach, PA-C 05/04/19 0200    Palumbo, April, MD 05/04/19 [phone]

## 2019-05-04 NOTE — Progress Notes (Addendum)
PROGRESS NOTE    [name]  [name] DOB: Oct 28, 1932 DOA: 05/03/2019 PCP: Leighton Ruff, MD    Brief Narrative:  [name] is a 86 y.o. female with medical history significant of mild memory disorder, COPD lives in assisted living comes in after mechanical fall injuring her left upper arm.  Patient denies any recent illnesses.  She denies any loss of consciousness.  She reports left arm pain.  She suffered a lack to her chin and brow which has been repaired emergency department.  Patient found to have a left humerus fracture and a mandibular fracture both orthopedic surgery and ENT have been called who will see patient in the morning.  Her humerus will likely need surgery.  Patient be referred for admission for orthopedic injuries in a patient with multiple other medical problems.  Her pain is much better after receiving fentanyl in the emergency department.  She lives alone at her assisted living. Alert and oriented with mild cognitive impairment. Orthopedics will await for ENT evaluation of mandible fracture prior to any surgical intervention as this may impact anesthesia.    Assessment & Plan:   Principal Problem:   Humerus fracture Active Problems:   COPD (chronic obstructive pulmonary disease) ([place])   Essential hypertension   Fall  Clinical problems list 1.  Mechanical fall with left proximal humeral neck fracture with displacement and comminution 2.  Right mandible condyle fracture 3.  Chronic obstructive pulmonary disease 4.  Essential hypertension  5.  Seizure disorder   1.  Mechanical fall with left proximal humeral neck fracture with displacement and comminution. Ortho has seen and evaluated and plan was for surgical fixation. However patient had mandibular fracture that needs correction prior to any surgical intervention. We will continue with pain management  2.  Right mandibular condyle fracture.  Patient was evaluated by oral  maxillofacial/plastic surgery and recommended liquid diet and no surgical intervention. Uncertain if Ortho will still want to proceed with surgical intervention given the risk of mandibular fracture/anesthetic procedure in the setting of a fracture. Continue with liquid diet per oral maxillofacial recommendation Absolutely no chewing per recommendation Continue with pain management  3.  Chronic obstructive pulmonary disease.  Not in acute exacerbation and no home oxygen use.  Continue to monitor Continue with bronchodilators as needed  4.  Essential hypertension.  Patient is currently normotensive  Continue to monitor  5.  Seizure disorder.  Stable Continue with Keppra IV and transition to p.o.  DVT prophylaxis: Lovenox subcu  Code Status: DNR Family Communication: None at bedside  Disposition Plan: Patient comes from home lives by herself. She will be discharged home and no barriers to discharge.   Consultants:   Orthopedics  Plastic surgery  Procedures: None   Antimicrobials: None    Subjective: Patient was seen and examined at bedside.  Mild cognitive impairment. Alert and not in acute distress.  Objective: Vitals:   05/03/19 2122 05/04/19 0004 05/04/19 0200 05/04/19 0454  BP: (!) 159/99 120/75 125/86 (!) 155/93  Pulse: (!) 107 99 (!) 101 98  Resp: 16 15 19 18   Temp:    98.3 F (36.8 C)  TempSrc:    Oral  SpO2: 94% 91% 91% 92%  Weight:      Height:        Intake/Output Summary (Last 24 hours) at 05/04/2019 1107 Last data filed at 05/04/2019 0900 Gross per 24 hour  Intake 81.18 ml  Output 0 ml  Net 81.18 ml   Danley Danker  Weights   05/03/19 2103  Weight: 74.8 kg    Examination:  General exam: Appears calm and comfortable.  Not in acute distress Respiratory system: Clear to auscultation. Respiratory effort normal. Cardiovascular system: S1 & S2 heard, RRR. No JVD, murmurs, rubs, gallops or clicks. No pedal edema. Gastrointestinal system: Abdomen is  nondistended, soft and nontender. No organomegaly or masses felt. Normal bowel sounds heard. Central nervous system: Alert and oriented. No focal neurological deficits. Extremities: On left shoulder sling . Skin: No rashes, lesions or ulcers Psychiatry: Judgement and insight appear normal. Mood & affect appropriate.     Data Reviewed: I have personally reviewed following labs and imaging studies  CBC: Recent Labs  Lab 05/04/19 0025 05/04/19 0449  WBC 13.3* 10.5  NEUTROABS 11.3*  --   HGB 13.6 13.8  HCT 41.6 42.6  MCV 93.7 94.2  PLT 198 [id number]   Basic Metabolic Panel: Recent Labs  Lab 05/04/19 0025 05/04/19 0449  NA 139 139  K 3.5 3.5  CL 104 104  CO2 24 24  GLUCOSE 132* 138*  BUN 31* 27*  CREATININE 0.96 0.76  CALCIUM 9.0 8.9   GFR: Estimated Creatinine Clearance: 53.6 mL/min (by C-G formula based on SCr of 0.76 mg/dL). Liver Function Tests: Recent Labs  Lab 05/04/19 0025  AST 23  ALT 17  ALKPHOS 76  BILITOT 1.1  PROT 6.7  ALBUMIN 3.9   No results for input(s): LIPASE, AMYLASE in the last 168 hours. No results for input(s): AMMONIA in the last 168 hours. Coagulation Profile: No results for input(s): INR, PROTIME in the last 168 hours. Cardiac Enzymes: No results for input(s): CKTOTAL, CKMB, CKMBINDEX, TROPONINI in the last 168 hours. BNP (last 3 results) No results for input(s): PROBNP in the last 8760 hours. HbA1C: No results for input(s): HGBA1C in the last 72 hours. CBG: No results for input(s): GLUCAP in the last 168 hours. Lipid Profile: No results for input(s): CHOL, HDL, LDLCALC, TRIG, CHOLHDL, LDLDIRECT in the last 72 hours. Thyroid Function Tests: No results for input(s): TSH, T4TOTAL, FREET4, T3FREE, THYROIDAB in the last 72 hours. Anemia Panel: No results for input(s): VITAMINB12, FOLATE, FERRITIN, TIBC, IRON, RETICCTPCT in the last 72 hours. Sepsis Labs: No results for input(s): PROCALCITON, LATICACIDVEN in the last 168 hours.  Recent  Results (from the past 240 hour(s))  Respiratory Panel by RT PCR (Flu A&B, Covid) - Nasopharyngeal Swab     Status: None   Collection Time: 05/03/19 11:30 PM   Specimen: Nasopharyngeal Swab  Result Value Ref Range Status   SARS Coronavirus 2 by RT PCR NEGATIVE NEGATIVE Final    Comment: (NOTE) SARS-CoV-2 target nucleic acids are NOT DETECTED. The SARS-CoV-2 RNA is generally detectable in upper respiratoy specimens during the acute phase of infection. The lowest concentration of SARS-CoV-2 viral copies this assay can detect is 131 copies/mL. A negative result does not preclude SARS-Cov-2 infection and should not be used as the sole basis for treatment or other patient management decisions. A negative result may occur with  improper specimen collection/handling, submission of specimen other than nasopharyngeal swab, presence of viral mutation(s) within the areas targeted by this assay, and inadequate number of viral copies (<131 copies/mL). A negative result must be combined with clinical observations, patient history, and epidemiological information. The expected result is Negative. Fact Sheet for Patients:  PinkCheek.be Fact Sheet for Healthcare Providers:  GravelBags.it This test is not yet ap proved or cleared by the [place] and  has been authorized  for detection and/or diagnosis of SARS-CoV-2 by FDA under an Emergency Use Authorization (EUA). This EUA will remain  in effect (meaning this test can be used) for the duration of the COVID-19 declaration under Section 564(b)(1) of the Act, 21 U.S.C. section 360bbb-3(b)(1), unless the authorization is terminated or revoked sooner.    Influenza A by PCR NEGATIVE NEGATIVE Final   Influenza B by PCR NEGATIVE NEGATIVE Final    Comment: (NOTE) The Xpert Xpress SARS-CoV-2/FLU/RSV assay is intended as an aid in  the diagnosis of influenza from Nasopharyngeal swab specimens  and  should not be used as a sole basis for treatment. Nasal washings and  aspirates are unacceptable for Xpert Xpress SARS-CoV-2/FLU/RSV  testing. Fact Sheet for Patients: PinkCheek.be Fact Sheet for Healthcare Providers: GravelBags.it This test is not yet approved or cleared by the [place] FDA and  has been authorized for detection and/or diagnosis of SARS-CoV-2 by  FDA under an Emergency Use Authorization (EUA). This EUA will remain  in effect (meaning this test can be used) for the duration of the  Covid-19 declaration under Section 564(b)(1) of the Act, 21  U.S.C. section 360bbb-3(b)(1), unless the authorization is  terminated or revoked. Performed at [facility], [place] [address], [place], [place] [address]          Radiology Studies: DG Chest 2 View  Result Date: 05/03/2019 CLINICAL DATA:  Chest pain after fall EXAM: CHEST - 2 VIEW COMPARISON:  11/29/2015 FINDINGS: Low lung volumes with linear scarring or atelectasis at the bases. Stable cardiomediastinal silhouette with aortic atherosclerosis. No pneumothorax. Displaced fracture involving the proximal left humerus. IMPRESSION: 1. Low lung volumes with atelectasis or scarring at the bases 2. Displaced proximal left humerus fracture Electronically Signed   By: Donavan Foil M.D.   On: 05/03/2019 22:02   CT Head Wo Contrast  Result Date: 05/03/2019 CLINICAL DATA:  Fall, facial lacerations EXAM: CT HEAD WITHOUT CONTRAST CT MAXILLOFACIAL WITHOUT CONTRAST CT CERVICAL SPINE WITHOUT CONTRAST TECHNIQUE: Multidetector CT imaging of the head, cervical spine, and maxillofacial structures were performed using the standard protocol without intravenous contrast. Multiplanar CT image reconstructions of the cervical spine and maxillofacial structures were also generated. COMPARISON:  MR head 12/08/2012, CT head 08/16/2009 FINDINGS: CT HEAD FINDINGS Brain: Punctate  foci of hypoattenuation the bilateral basal ganglia likely reflect sequela of remote lacunar infarcts several of which were present on comparison CT from 20/11. There mixed patchy and confluent areas of hypoattenuation throughout the subcortical deep and periventricular white matter, similar to slightly advanced from comparison in most compatible with chronic microvascular angiopathy. Symmetric prominence of the ventricles, cisterns and sulci compatible with parenchymal volume loss. Slight ex vacuo dilatation of the ventricles. No convincing evidence of acute infarction, hemorrhage, frank hydrocephalus, extra-axial collection or mass lesion/mass effect. Vascular: Atherosclerotic calcification of the carotid siphons. No hyperdense vessel. Skull: No calvarial fracture, scalp swelling or hematoma. Other: None CT MAXILLOFACIAL FINDINGS Osseous: The osseous structures appear diffusely demineralized which may limit detection of small or nondisplaced fractures. No fracture of the bony orbits. Nasal bones are intact. No mid face fractures are seen. The pterygoid plates are intact. There is a comminuted fracture of the right mandibular condyle which appears impacted and slightly anteriorly displaced relative to the contralateral side. No other mandibular fracture is identified. The left temporomandibular joint is more normally aligned. 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 No temporal bone fractures are identified. There is complete absence of the mandibular dentition. Maxillary  dental bridge is noted with several dental implants. Orbits: The globes appear normal and symmetric with evidence of prior bilateral lens extractions. Symmetric appearance of the extraocular musculature and optic nerve sheath complexes. Normal caliber of the superior ophthalmic veins. Sinuses: Minimal thickening in the maxillary and ethmoidal sinuses. No air-fluid levels. Mastoid air cells are well aerated, better  seen on CT images head. Middle ear cavities are clear. Ossicular chains appear normally configured. Soft tissues: Some pre mandibular soft tissue swelling and thickening is noted, compatible with the reported site of laceration. No other acute soft tissue abnormality is seen. CT CERVICAL SPINE FINDINGS Alignment: Slight exaggeration of the normal cervical lordosis is likely positional. Stabilization collar is not visualized at the time of exam. There is narrowing of the basion dens interval with developing and no a pseudoarticulation which is a variant finding in can be seen in degenerative change. There is slight stepwise retrolisthesis of C3-C5 on a likely degenerative basis given spondylitic and facet changes at the involved levels. No traumatic listhesis. No abnormally widened, perched or jumped facets. Skull base and vertebrae: No acute fracture. No primary bone lesion or focal pathologic process. Soft tissues and spinal canal: No pre or paravertebral fluid or swelling. No visible canal hematoma. Disc levels: Multilevel intervertebral disc height loss with spondylitic endplate changes. Disc osteophyte complexes at the C2-C6 levels efface the ventral thecal sac with at most mild canal stenosis. Uncinate spurring and facet hypertrophic changes result in mild-to-moderate multilevel neural foraminal narrowing with slightly more severe narrowing on the right at C3-4 and C4-5. Upper chest: No acute abnormality in the upper chest or imaged lung apices. Other: Slightly heterogeneous enlarged thyroid with few punctate calcifications. IMPRESSION: 1. No acute intracranial abnormality. No scalp swelling or calvarial fracture. 2. 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 3. Chronic microvascular angiopathic changes and parenchymal volume loss, somewhat progressed since comparison studies. 4. Comminuted, impacted and anteriorly displaced fracture of the right mandibular condyle. No other mandibular fracture  identified. Associated pre mandibular soft tissue swelling which likely correlates with site of reported laceration. 5. No other facial bone fracture. 6. No acute fracture or traumatic listhesis of the cervical spine. 7. Multilevel intervertebral disc height loss and spondylitic changes detailed above. 8. Heterogeneous, nodular thyroid. If further evaluation is warranted on a clinical basis following discussion of patient's risk factors and comorbidities, consider follow-up ultrasound. This follows consensus guidelines: Managing Incidental Thyroid Nodules Detected on Imaging: White Paper of the ACR Incidental Thyroid Findings Committee. J Am Coll Radiol 2015; 12:143-150. and [facility] 3-tiered system for managing ITNs: J Am Coll Radiol. 2015; Feb;12(2): 143-50 Electronically Signed   By: Lovena Le M.D.   On: 05/03/2019 22:34   CT Cervical Spine Wo Contrast  Result Date: 05/03/2019 CLINICAL DATA:  Fall, facial lacerations EXAM: CT HEAD WITHOUT CONTRAST CT MAXILLOFACIAL WITHOUT CONTRAST CT CERVICAL SPINE WITHOUT CONTRAST TECHNIQUE: Multidetector CT imaging of the head, cervical spine, and maxillofacial structures were performed using the standard protocol without intravenous contrast. Multiplanar CT image reconstructions of the cervical spine and maxillofacial structures were also generated. COMPARISON:  MR head 12/08/2012, CT head 08/16/2009 FINDINGS: CT HEAD FINDINGS Brain: Punctate foci of hypoattenuation the bilateral basal ganglia likely reflect sequela of remote lacunar infarcts several of which were present on comparison CT from 20/11. There mixed patchy and confluent areas of hypoattenuation throughout the subcortical deep and periventricular white matter, similar to slightly advanced from comparison in most compatible with chronic microvascular angiopathy. Symmetric prominence of  the ventricles, cisterns and sulci compatible with parenchymal volume loss. Slight ex vacuo dilatation of the ventricles. No  convincing evidence of acute infarction, hemorrhage, frank hydrocephalus, extra-axial collection or mass lesion/mass effect. Vascular: Atherosclerotic calcification of the carotid siphons. No hyperdense vessel. Skull: No calvarial fracture, scalp swelling or hematoma. Other: None CT MAXILLOFACIAL FINDINGS Osseous: The osseous structures appear diffusely demineralized which may limit detection of small or nondisplaced fractures. No fracture of the bony orbits. Nasal bones are intact. No mid face fractures are seen. The pterygoid plates are intact. There is a comminuted fracture of the right mandibular condyle which appears impacted and slightly anteriorly displaced relative to the contralateral side. No other mandibular fracture is identified. The left temporomandibular joint is more normally aligned. 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 No temporal bone fractures are identified. There is complete absence of the mandibular dentition. Maxillary dental bridge is noted with several dental implants. Orbits: The globes appear normal and symmetric with evidence of prior bilateral lens extractions. Symmetric appearance of the extraocular musculature and optic nerve sheath complexes. Normal caliber of the superior ophthalmic veins. Sinuses: Minimal thickening in the maxillary and ethmoidal sinuses. No air-fluid levels. Mastoid air cells are well aerated, better seen on CT images head. Middle ear cavities are clear. Ossicular chains appear normally configured. Soft tissues: Some pre mandibular soft tissue swelling and thickening is noted, compatible with the reported site of laceration. No other acute soft tissue abnormality is seen. CT CERVICAL SPINE FINDINGS Alignment: Slight exaggeration of the normal cervical lordosis is likely positional. Stabilization collar is not visualized at the time of exam. There is narrowing of the basion dens interval with developing and no a  pseudoarticulation which is a variant finding in can be seen in degenerative change. There is slight stepwise retrolisthesis of C3-C5 on a likely degenerative basis given spondylitic and facet changes at the involved levels. No traumatic listhesis. No abnormally widened, perched or jumped facets. Skull base and vertebrae: No acute fracture. No primary bone lesion or focal pathologic process. Soft tissues and spinal canal: No pre or paravertebral fluid or swelling. No visible canal hematoma. Disc levels: Multilevel intervertebral disc height loss with spondylitic endplate changes. Disc osteophyte complexes at the C2-C6 levels efface the ventral thecal sac with at most mild canal stenosis. Uncinate spurring and facet hypertrophic changes result in mild-to-moderate multilevel neural foraminal narrowing with slightly more severe narrowing on the right at C3-4 and C4-5. Upper chest: No acute abnormality in the upper chest or imaged lung apices. Other: Slightly heterogeneous enlarged thyroid with few punctate calcifications. IMPRESSION: 1. No acute intracranial abnormality. No scalp swelling or calvarial fracture. 2. 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 3. Chronic microvascular angiopathic changes and parenchymal volume loss, somewhat progressed since comparison studies. 4. Comminuted, impacted and anteriorly displaced fracture of the right mandibular condyle. No other mandibular fracture identified. Associated pre mandibular soft tissue swelling which likely correlates with site of reported laceration. 5. No other facial bone fracture. 6. No acute fracture or traumatic listhesis of the cervical spine. 7. Multilevel intervertebral disc height loss and spondylitic changes detailed above. 8. Heterogeneous, nodular thyroid. If further evaluation is warranted on a clinical basis following discussion of patient's risk factors and comorbidities, consider follow-up ultrasound. This follows consensus  guidelines: Managing Incidental Thyroid Nodules Detected on Imaging: White Paper of the ACR Incidental Thyroid Findings Committee. J Am Coll Radiol 2015; 12:143-150. and [facility] 3-tiered system for managing ITNs: J Am Coll Radiol.  2015; Feb;12(2): 143-50 Electronically Signed   By: Lovena Le M.D.   On: 05/03/2019 22:34   DG Shoulder Left  Result Date: 05/03/2019 CLINICAL DATA:  Fall with shoulder pain and deformity EXAM: LEFT SHOULDER - 2+ VIEW COMPARISON:  None. FINDINGS: AC joint is intact. Acute comminuted humeral neck fracture with close to 1 shaft diameter of displacement for the midline. Moderate valgus angulation of distal fracture fragment. Mildly displaced greater tuberosity fracture fragment. IMPRESSION: Acute displaced and angulated proximal humerus fracture involving the humeral neck and greater tuberosity. Electronically Signed   By: Donavan Foil M.D.   On: 05/03/2019 22:04   CT Maxillofacial WO CM  Result Date: 05/03/2019 CLINICAL DATA:  Fall, facial lacerations EXAM: CT HEAD WITHOUT CONTRAST CT MAXILLOFACIAL WITHOUT CONTRAST CT CERVICAL SPINE WITHOUT CONTRAST TECHNIQUE: Multidetector CT imaging of the head, cervical spine, and maxillofacial structures were performed using the standard protocol without intravenous contrast. Multiplanar CT image reconstructions of the cervical spine and maxillofacial structures were also generated. COMPARISON:  MR head 12/08/2012, CT head 08/16/2009 FINDINGS: CT HEAD FINDINGS Brain: Punctate foci of hypoattenuation the bilateral basal ganglia likely reflect sequela of remote lacunar infarcts several of which were present on comparison CT from 20/11. There mixed patchy and confluent areas of hypoattenuation throughout the subcortical deep and periventricular white matter, similar to slightly advanced from comparison in most compatible with chronic microvascular angiopathy. Symmetric prominence of the ventricles, cisterns and sulci compatible with parenchymal  volume loss. Slight ex vacuo dilatation of the ventricles. No convincing evidence of acute infarction, hemorrhage, frank hydrocephalus, extra-axial collection or mass lesion/mass effect. Vascular: Atherosclerotic calcification of the carotid siphons. No hyperdense vessel. Skull: No calvarial fracture, scalp swelling or hematoma. Other: None CT MAXILLOFACIAL FINDINGS Osseous: The osseous structures appear diffusely demineralized which may limit detection of small or nondisplaced fractures. No fracture of the bony orbits. Nasal bones are intact. No mid face fractures are seen. The pterygoid plates are intact. There is a comminuted fracture of the right mandibular condyle which appears impacted and slightly anteriorly displaced relative to the contralateral side. No other mandibular fracture is identified. The left temporomandibular joint is more normally aligned. 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 No temporal bone fractures are identified. There is complete absence of the mandibular dentition. Maxillary dental bridge is noted with several dental implants. Orbits: The globes appear normal and symmetric with evidence of prior bilateral lens extractions. Symmetric appearance of the extraocular musculature and optic nerve sheath complexes. Normal caliber of the superior ophthalmic veins. Sinuses: Minimal thickening in the maxillary and ethmoidal sinuses. No air-fluid levels. Mastoid air cells are well aerated, better seen on CT images head. Middle ear cavities are clear. Ossicular chains appear normally configured. Soft tissues: Some pre mandibular soft tissue swelling and thickening is noted, compatible with the reported site of laceration. No other acute soft tissue abnormality is seen. CT CERVICAL SPINE FINDINGS Alignment: Slight exaggeration of the normal cervical lordosis is likely positional. Stabilization collar is not visualized at the time of exam. There is narrowing of the  basion dens interval with developing and no a pseudoarticulation which is a variant finding in can be seen in degenerative change. There is slight stepwise retrolisthesis of C3-C5 on a likely degenerative basis given spondylitic and facet changes at the involved levels. No traumatic listhesis. No abnormally widened, perched or jumped facets. Skull base and vertebrae: No acute fracture. No primary bone lesion or focal pathologic process. Soft tissues and spinal canal: No  pre or paravertebral fluid or swelling. No visible canal hematoma. Disc levels: Multilevel intervertebral disc height loss with spondylitic endplate changes. Disc osteophyte complexes at the C2-C6 levels efface the ventral thecal sac with at most mild canal stenosis. Uncinate spurring and facet hypertrophic changes result in mild-to-moderate multilevel neural foraminal narrowing with slightly more severe narrowing on the right at C3-4 and C4-5. Upper chest: No acute abnormality in the upper chest or imaged lung apices. Other: Slightly heterogeneous enlarged thyroid with few punctate calcifications. IMPRESSION: 1. No acute intracranial abnormality. No scalp swelling or calvarial fracture. 2. 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 3. Chronic microvascular angiopathic changes and parenchymal volume loss, somewhat progressed since comparison studies. 4. Comminuted, impacted and anteriorly displaced fracture of the right mandibular condyle. No other mandibular fracture identified. Associated pre mandibular soft tissue swelling which likely correlates with site of reported laceration. 5. No other facial bone fracture. 6. No acute fracture or traumatic listhesis of the cervical spine. 7. Multilevel intervertebral disc height loss and spondylitic changes detailed above. 8. Heterogeneous, nodular thyroid. If further evaluation is warranted on a clinical basis following discussion of patient's risk factors and comorbidities, consider follow-up  ultrasound. This follows consensus guidelines: Managing Incidental Thyroid Nodules Detected on Imaging: White Paper of the ACR Incidental Thyroid Findings Committee. J Am Coll Radiol 2015; 12:143-150. and [facility] 3-tiered system for managing ITNs: J Am Coll Radiol. 2015; Feb;12(2): 143-50 Electronically Signed   By: Lovena Le M.D.   On: 05/03/2019 22:34        Scheduled Meds: Continuous Infusions: . sodium chloride 75 mL/hr at 05/04/19 0534  . levETIRAcetam 250 mg (05/04/19 0914)   And  . levETIRAcetam       LOS: 0 days    Time spent: Emm[place], MD [facility] Pager [phone]   If 7PM-7AM, please contact night-coverage [email or web address] Password [facility] 05/04/2019, 11:07 AM

## 2019-05-05 DIAGNOSIS — J438 Other emphysema: Secondary | ICD-10-CM

## 2019-05-05 LAB — COMPREHENSIVE METABOLIC PANEL
ALT: 15 U/L (ref 0–44)
AST: 20 U/L (ref 15–41)
Albumin: 3.8 g/dL (ref 3.5–5.0)
Alkaline Phosphatase: 78 U/L (ref 38–126)
Anion gap: 10 (ref 5–15)
BUN: 21 mg/dL (ref 8–23)
CO2: 25 mmol/L (ref 22–32)
Calcium: 8.7 mg/dL — ABNORMAL LOW (ref 8.9–10.3)
Chloride: 105 mmol/L (ref 98–111)
Creatinine, Ser: 0.58 mg/dL (ref 0.44–1.00)
GFR calc Af Amer: >60 mL/min (ref >60)
GFR calc non Af Amer: >60 mL/min (ref >60)
Glucose, Bld: 121 mg/dL — ABNORMAL HIGH (ref 70–99)
Potassium: 3.7 mmol/L (ref 3.5–5.1)
Sodium: 140 mmol/L (ref 135–145)
Total Bilirubin: 1.2 mg/dL (ref 0.3–1.2)
Total Protein: 6.7 g/dL (ref 6.5–8.1)

## 2019-05-05 LAB — CBC
HCT: 42 % (ref 36.0–46.0)
Hemoglobin: 13.7 g/dL (ref 12.0–15.0)
MCH: 31 pg (ref 26.0–34.0)
MCHC: 32.6 g/dL (ref 30.0–36.0)
MCV: 95 fL (ref 80.0–100.0)
Platelets: 194 10*3/uL (ref 150–400)
RBC: 4.42 MIL/uL (ref 3.87–5.11)
RDW: 12.9 % (ref 11.5–15.5)
WBC: 9.8 10*3/uL (ref 4.0–10.5)
nRBC: 0 % (ref 0.0–0.2)

## 2019-05-05 LAB — PHOSPHORUS: Phosphorus: 2.6 mg/dL (ref 2.5–4.6)

## 2019-05-05 LAB — MAGNESIUM: Magnesium: 2.2 mg/dL (ref 1.7–2.4)

## 2019-05-05 MED ORDER — SENNOSIDES-DOCUSATE SODIUM 8.6-50 MG PO TABS
1.0000 | ORAL_TABLET | Freq: Two times a day (BID) | ORAL | Status: DC
  Administered 2019-05-05 – 2019-05-13 (×15): 1 via ORAL
  Filled 2019-05-05 (×16): qty 1

## 2019-05-05 MED ORDER — POLYETHYLENE GLYCOL 3350 17 G PO PACK
17.0000 g | PACK | Freq: Every day | ORAL | Status: DC
  Administered 2019-05-05 – 2019-05-12 (×6): 17 g via ORAL
  Filled 2019-05-05 (×8): qty 1

## 2019-05-05 MED ORDER — AMLODIPINE BESYLATE 5 MG PO TABS
5.0000 mg | ORAL_TABLET | Freq: Every day | ORAL | Status: DC
  Administered 2019-05-05 – 2019-05-13 (×9): 5 mg via ORAL
  Filled 2019-05-05 (×9): qty 1

## 2019-05-05 MED ORDER — PRAMIPEXOLE DIHYDROCHLORIDE 0.25 MG PO TABS
0.2500 mg | ORAL_TABLET | Freq: Every day | ORAL | Status: DC
  Administered 2019-05-06 – 2019-05-12 (×7): 0.25 mg via ORAL
  Filled 2019-05-05 (×7): qty 1

## 2019-05-05 NOTE — Progress Notes (Signed)
History of fall and seizure disorder noted in pt chart. Seizure pads and floor mats placed. Will continue to monitor.

## 2019-05-05 NOTE — Progress Notes (Addendum)
Patient has BP of 166/81 HR 88, afebrile, 95 on 2l [facility]; provider on call made aware of BP; will ffup for further order.

## 2019-05-05 NOTE — TOC Initial Note (Addendum)
Transition of Care [facility]) - Initial/Assessment Note    Patient Details  Name: [name] MRN: [id number] Date of Birth: [birth year]  [facility] CM/SW Contact:    Lia Hopping, [place] Phone Number: 05/05/2019, 4:20 PM  Clinical Narrative:     Patient admitted after fall. Patient found to have humerus fracture and mandibular fracture. . Orthopedics planning for right shoulder arthroplasty tomorrow 1/28.              CSW met with the patient and her sister Inez Catalina at bedside. Patient is resident at [facility]. Patient is unsure if she will be able to return directly from hospital. That will be determine after she works with therapy. Patient reports prior to her fall she was independent with ADL's and used a Rolator at times. CSW reached out to [facility] resident coordinator to provide update, left voicemail.  FL2 to be complete closer to discharge.   Expected Discharge Plan: Skilled Nursing Facility Barriers to Discharge: No Barriers Identified   Patient Goals and CMS Choice     Choice offered to / list presented to : NA  Expected Discharge Plan and Services Expected Discharge Plan: [place] In-house Referral: Clinical Social Work Discharge Planning Services: CM Consult   Living arrangements for the past 2 months: [place]                                      Prior Living Arrangements/Services Living arrangements for the past 2 months: [place] Lives with:: Facility Resident Patient language and need for interpreter reviewed:: Yes Do you feel safe going back to the place where you live?: Yes      Need for Family Participation in Patient Care: Yes (Comment) Care giver support system in place?: Yes (comment) Current home services: DME Criminal Activity/Legal Involvement Pertinent to Current Situation/Hospitalization: No - Comment as needed  Activities of Daily  Living Home Assistive Devices/Equipment: Walker (specify type) ADL Screening (condition at time of admission) Patient's cognitive ability adequate to safely complete daily activities?: Yes Is the patient deaf or have difficulty hearing?: No Does the patient have difficulty seeing, even when wearing glasses/contacts?: No Does the patient have difficulty concentrating, remembering, or making decisions?: Yes Patient able to express need for assistance with ADLs?: Yes Does the patient have difficulty dressing or bathing?: Yes Independently performs ADLs?: No Communication: Independent Dressing (OT): Needs assistance Is this a change from baseline?: Pre-admission baseline Grooming: Needs assistance Is this a change from baseline?: Pre-admission baseline Feeding: Independent Bathing: Needs assistance Is this a change from baseline?: Pre-admission baseline Toileting: Independent In/Out Bed: Independent Walks in Home: Independent Does the patient have difficulty walking or climbing stairs?: Yes Weakness of Legs: Both Weakness of Arms/Hands: Left  Permission Sought/Granted Permission sought to share information with : Case Manager, Family Supports Permission granted to share information with : Yes, Verbal Permission Granted     Permission granted to share info w AGENCY: [place] granted to share info w Relationship: Daughter Inez Catalina  Permission granted to share info w Contact Information: [phone]  Emotional Assessment Appearance:: Appears stated age Attitude/Demeanor/Rapport: Engaged Affect (typically observed): Accepting, Pleasant Orientation: : Oriented to Self, Oriented to Place, Oriented to  Time, Oriented to Situation Alcohol / Substance Use: Not Applicable Psych Involvement: No (comment)  Admission diagnosis:  Humerus fracture [S42.309A] Fall, initial  encounter [id number]] Chin laceration, initial encounter [S01.81XA] Closed fracture of right  mandibular angle, initial encounter ([place]) [S02.651A] Closed fracture of proximal end of left humerus, unspecified fracture morphology, initial encounter [S42.202A] Patient Active Problem List   Diagnosis Date Noted  . Humerus fracture 05/04/2019  . Right thyroid nodule 02/28/2016  . Abnormal thyroid uptake 11/10/2015  . Coronary artery calcification 12/05/2014  . Panlobular emphysema ([place]) 07/25/2014  . Dyspnea and respiratory abnormality 07/25/2014  . Multiple lung nodules on CT 07/25/2014  . Smoking history 07/25/2014  . Pain of right lower leg 05/23/2014  . Fall 05/23/2014  . Gait disorder 04/25/2014  . Memory disorder 04/25/2014  . Essential tremor 04/25/2014  . Advanced directives, counseling/discussion 02/18/2013  . Seizure disorder ([place]) 10/15/2012  . Cancer of skin, squamous cell 10/15/2012  . Unspecified vitamin D deficiency 10/15/2012  . Other and unspecified hyperlipidemia 10/15/2012  . Edema 09/10/2012  . Restless legs syndrome (RLS) 09/12/2011  . Insomnia, unspecified 09/12/2011  . COPD (chronic obstructive pulmonary disease) ([place]) 05/23/2011  . Essential hypertension 01/31/2011  . Senile osteoporosis 01/31/2011  . Major depressive disorder, single episode 01/31/2011   PCP:  Leighton Ruff, MD Pharmacy:   [facility] [place] [address] Phone: [phone] Fax: [phone]     Social Determinants of Health (SDOH) Interventions    Readmission Risk Interventions No flowsheet data found.

## 2019-05-05 NOTE — Progress Notes (Signed)
PROGRESS NOTE    [name]  [name] DOB: [birth year] DOA: 05/03/2019 PCP: Leighton Ruff, MD    Brief Narrative:  [name] is a 86 y.o. female with medical history significant of mild memory disorder, COPD lives in assisted living comes in after mechanical fall injuring her left upper arm.  Patient denies any recent illnesses.  She denies any loss of consciousness.  She reports left arm pain.  She suffered a lack to her chin and brow which has been repaired emergency department.  Patient found to have a left humerus fracture and a mandibular fracture both orthopedic surgery and ENT have been called who will see patient in the morning.  Her humerus will likely need surgery.  Patient be referred for admission for orthopedic injuries in a patient with multiple other medical problems.  Her pain is much better after receiving fentanyl in the emergency department.  She lives alone at her assisted living. Alert and oriented with mild cognitive impairment. Orthopedics planning for right shoulder arthroplasty tomorrow 05/06/2019.    Assessment & Plan:   Principal Problem:   Humerus fracture Active Problems:   COPD (chronic obstructive pulmonary disease) ([place])   Essential hypertension   Fall  Clinical problems list 1.  Mechanical fall with left proximal humeral neck fracture with displacement and comminution 2.  Right mandible condyle fracture 3.  Chronic obstructive pulmonary disease 4.  Essential hypertension  5.  Seizure disorder   1.  Mechanical fall with left proximal humeral neck fracture with displacement and comminution. Ortho has seen and evaluated and plan was for reverse shoulder arthroplasty. Continue with shoulder sling Continue with pain management  2.  Right mandibular condyle fracture.  Patient was evaluated by oral maxillofacial/plastic surgery and recommended liquid diet and no surgical intervention for mandibular fracture. Ortho has discussed with Dr.  Marla Roe and plan for surgical intervention tomorrow Thursday, 05/06/2019.   3.  Chronic obstructive pulmonary disease.  Not in acute exacerbation and no home oxygen use.  Continue to monitor Continue with bronchodilators as needed  4.  Essential hypertension.  Patient is currently normotensive  Continue to monitor  5.  Seizure disorder.  Stable Continue with Keppra IV and transition to p.o. after Ortho surgery.  DVT prophylaxis: Lovenox subcute  Code Status: DNR Family Communication: None at bedside  Disposition Plan: Patient comes from home  And lives by herself. She will likely be discharged to subacute rehab after shoulder surgery.  Consultants:   Orthopedics  Plastic surgery  Procedures: None   Antimicrobials: None    Subjective: Patient was seen and examined at bedside.  Mild cognitive impairment. Alert and not in acute distress.  Objective: Vitals:   05/05/19 0603 05/05/19 0831 05/05/19 1056 05/05/19 1100  BP: (!) 166/81 (!) 142/87    Pulse: 88 88    Resp:  18    Temp:  97.9 F (36.6 C)    TempSrc:  Oral    SpO2:  95% 90% 90%  Weight:      Height:        Intake/Output Summary (Last 24 hours) at 05/05/2019 1501 Last data filed at 05/05/2019 1302 Gross per 24 hour  Intake 1326.79 ml  Output 600 ml  Net 726.79 ml   Filed Weights   05/03/19 2103  Weight: 74.8 kg    Examination:  General exam: Appears calm and comfortable.  Not in acute distress Respiratory system: Clear to auscultation. Respiratory effort normal. Cardiovascular system: S1 & S2 heard, RRR. No  JVD, murmurs, rubs, gallops or clicks. No pedal edema. Gastrointestinal system: Abdomen is nondistended, soft and nontender. No organomegaly or masses felt. Normal bowel sounds heard. Central nervous system: Alert and oriented. No focal neurological deficits. Extremities: On left shoulder sling . Skin: No rashes, lesions or ulcers Psychiatry: Judgement and insight appear normal. Mood & affect  appropriate.     Data Reviewed: I have personally reviewed following labs and imaging studies  CBC: Recent Labs  Lab 05/04/19 0025 05/04/19 0449 05/05/19 0330  WBC 13.3* 10.5 9.8  NEUTROABS 11.3*  --   --   HGB 13.6 13.8 13.7  HCT 41.6 42.6 42.0  MCV 93.7 94.2 95.0  PLT 198 203 [id number]   Basic Metabolic Panel: Recent Labs  Lab 05/04/19 0025 05/04/19 0449 05/05/19 0330  NA 139 139 140  K 3.5 3.5 3.7  CL 104 104 105  CO2 24 24 25   GLUCOSE 132* 138* 121*  BUN 31* 27* 21  CREATININE 0.96 0.76 0.58  CALCIUM 9.0 8.9 8.7*  MG  --   --  2.2  PHOS  --   --  2.6   GFR: Estimated Creatinine Clearance: 53.6 mL/min (by C-G formula based on SCr of 0.58 mg/dL). Liver Function Tests: Recent Labs  Lab 05/04/19 0025 05/05/19 0330  AST 23 20  ALT 17 15  ALKPHOS 76 78  BILITOT 1.1 1.2  PROT 6.7 6.7  ALBUMIN 3.9 3.8   No results for input(s): LIPASE, AMYLASE in the last 168 hours. No results for input(s): AMMONIA in the last 168 hours. Coagulation Profile: No results for input(s): INR, PROTIME in the last 168 hours. Cardiac Enzymes: No results for input(s): CKTOTAL, CKMB, CKMBINDEX, TROPONINI in the last 168 hours. BNP (last 3 results) No results for input(s): PROBNP in the last 8760 hours. HbA1C: No results for input(s): HGBA1C in the last 72 hours. CBG: No results for input(s): GLUCAP in the last 168 hours. Lipid Profile: No results for input(s): CHOL, HDL, LDLCALC, TRIG, CHOLHDL, LDLDIRECT in the last 72 hours. Thyroid Function Tests: No results for input(s): TSH, T4TOTAL, FREET4, T3FREE, THYROIDAB in the last 72 hours. Anemia Panel: No results for input(s): VITAMINB12, FOLATE, FERRITIN, TIBC, IRON, RETICCTPCT in the last 72 hours. Sepsis Labs: No results for input(s): PROCALCITON, LATICACIDVEN in the last 168 hours.  Recent Results (from the past 240 hour(s))  Respiratory Panel by RT PCR (Flu A&B, Covid) - Nasopharyngeal Swab     Status: None   Collection Time:  05/03/19 11:30 PM   Specimen: Nasopharyngeal Swab  Result Value Ref Range Status   SARS Coronavirus 2 by RT PCR NEGATIVE NEGATIVE Final    Comment: (NOTE) SARS-CoV-2 target nucleic acids are NOT DETECTED. The SARS-CoV-2 RNA is generally detectable in upper respiratoy specimens during the acute phase of infection. The lowest concentration of SARS-CoV-2 viral copies this assay can detect is 131 copies/mL. A negative result does not preclude SARS-Cov-2 infection and should not be used as the sole basis for treatment or other patient management decisions. A negative result may occur with  improper specimen collection/handling, submission of specimen other than nasopharyngeal swab, presence of viral mutation(s) within the areas targeted by this assay, and inadequate number of viral copies (<131 copies/mL). A negative result must be combined with clinical observations, patient history, and epidemiological information. The expected result is Negative. Fact Sheet for Patients:  PinkCheek.be Fact Sheet for Healthcare Providers:  GravelBags.it This test is not yet ap proved or cleared by the [place] FDA  and  has been authorized for detection and/or diagnosis of SARS-CoV-2 by FDA under an Emergency Use Authorization (EUA). This EUA will remain  in effect (meaning this test can be used) for the duration of the COVID-19 declaration under Section 564(b)(1) of the Act, 21 U.S.C. section 360bbb-3(b)(1), unless the authorization is terminated or revoked sooner.    Influenza A by PCR NEGATIVE NEGATIVE Final   Influenza B by PCR NEGATIVE NEGATIVE Final    Comment: (NOTE) The Xpert Xpress SARS-CoV-2/FLU/RSV assay is intended as an aid in  the diagnosis of influenza from Nasopharyngeal swab specimens and  should not be used as a sole basis for treatment. Nasal washings and  aspirates are unacceptable for Xpert Xpress SARS-CoV-2/FLU/RSV    testing. Fact Sheet for Patients: PinkCheek.be Fact Sheet for Healthcare Providers: GravelBags.it This test is not yet approved or cleared by the [place] FDA and  has been authorized for detection and/or diagnosis of SARS-CoV-2 by  FDA under an Emergency Use Authorization (EUA). This EUA will remain  in effect (meaning this test can be used) for the duration of the  Covid-19 declaration under Section 564(b)(1) of the Act, 21  U.S.C. section 360bbb-3(b)(1), unless the authorization is  terminated or revoked. Performed at [facility], [place] [address], [place], [place] [address]          Radiology Studies: DG Chest 2 View  Result Date: 05/03/2019 CLINICAL DATA:  Chest pain after fall EXAM: CHEST - 2 VIEW COMPARISON:  11/29/2015 FINDINGS: Low lung volumes with linear scarring or atelectasis at the bases. Stable cardiomediastinal silhouette with aortic atherosclerosis. No pneumothorax. Displaced fracture involving the proximal left humerus. IMPRESSION: 1. Low lung volumes with atelectasis or scarring at the bases 2. Displaced proximal left humerus fracture Electronically Signed   By: Donavan Foil M.D.   On: 05/03/2019 22:02   CT Head Wo Contrast  Result Date: 05/03/2019 CLINICAL DATA:  Fall, facial lacerations EXAM: CT HEAD WITHOUT CONTRAST CT MAXILLOFACIAL WITHOUT CONTRAST CT CERVICAL SPINE WITHOUT CONTRAST TECHNIQUE: Multidetector CT imaging of the head, cervical spine, and maxillofacial structures were performed using the standard protocol without intravenous contrast. Multiplanar CT image reconstructions of the cervical spine and maxillofacial structures were also generated. COMPARISON:  MR head 12/08/2012, CT head 08/16/2009 FINDINGS: CT HEAD FINDINGS Brain: Punctate foci of hypoattenuation the bilateral basal ganglia likely reflect sequela of remote lacunar infarcts several of which were present on  comparison CT from 20/11. There mixed patchy and confluent areas of hypoattenuation throughout the subcortical deep and periventricular white matter, similar to slightly advanced from comparison in most compatible with chronic microvascular angiopathy. Symmetric prominence of the ventricles, cisterns and sulci compatible with parenchymal volume loss. Slight ex vacuo dilatation of the ventricles. No convincing evidence of acute infarction, hemorrhage, frank hydrocephalus, extra-axial collection or mass lesion/mass effect. Vascular: Atherosclerotic calcification of the carotid siphons. No hyperdense vessel. Skull: No calvarial fracture, scalp swelling or hematoma. Other: None CT MAXILLOFACIAL FINDINGS Osseous: The osseous structures appear diffusely demineralized which may limit detection of small or nondisplaced fractures. No fracture of the bony orbits. Nasal bones are intact. No mid face fractures are seen. The pterygoid plates are intact. There is a comminuted fracture of the right mandibular condyle which appears impacted and slightly anteriorly displaced relative to the contralateral side. No other mandibular fracture is identified. The left temporomandibular joint is more normally aligned. 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 No temporal bone fractures are identified. There is complete  absence of the mandibular dentition. Maxillary dental bridge is noted with several dental implants. Orbits: The globes appear normal and symmetric with evidence of prior bilateral lens extractions. Symmetric appearance of the extraocular musculature and optic nerve sheath complexes. Normal caliber of the superior ophthalmic veins. Sinuses: Minimal thickening in the maxillary and ethmoidal sinuses. No air-fluid levels. Mastoid air cells are well aerated, better seen on CT images head. Middle ear cavities are clear. Ossicular chains appear normally configured. Soft tissues: Some pre mandibular  soft tissue swelling and thickening is noted, compatible with the reported site of laceration. No other acute soft tissue abnormality is seen. CT CERVICAL SPINE FINDINGS Alignment: Slight exaggeration of the normal cervical lordosis is likely positional. Stabilization collar is not visualized at the time of exam. There is narrowing of the basion dens interval with developing and no a pseudoarticulation which is a variant finding in can be seen in degenerative change. There is slight stepwise retrolisthesis of C3-C5 on a likely degenerative basis given spondylitic and facet changes at the involved levels. No traumatic listhesis. No abnormally widened, perched or jumped facets. Skull base and vertebrae: No acute fracture. No primary bone lesion or focal pathologic process. Soft tissues and spinal canal: No pre or paravertebral fluid or swelling. No visible canal hematoma. Disc levels: Multilevel intervertebral disc height loss with spondylitic endplate changes. Disc osteophyte complexes at the C2-C6 levels efface the ventral thecal sac with at most mild canal stenosis. Uncinate spurring and facet hypertrophic changes result in mild-to-moderate multilevel neural foraminal narrowing with slightly more severe narrowing on the right at C3-4 and C4-5. Upper chest: No acute abnormality in the upper chest or imaged lung apices. Other: Slightly heterogeneous enlarged thyroid with few punctate calcifications. IMPRESSION: 1. No acute intracranial abnormality. No scalp swelling or calvarial fracture. 2. 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 3. Chronic microvascular angiopathic changes and parenchymal volume loss, somewhat progressed since comparison studies. 4. Comminuted, impacted and anteriorly displaced fracture of the right mandibular condyle. No other mandibular fracture identified. Associated pre mandibular soft tissue swelling which likely correlates with site of reported laceration. 5. No other facial  bone fracture. 6. No acute fracture or traumatic listhesis of the cervical spine. 7. Multilevel intervertebral disc height loss and spondylitic changes detailed above. 8. Heterogeneous, nodular thyroid. If further evaluation is warranted on a clinical basis following discussion of patient's risk factors and comorbidities, consider follow-up ultrasound. This follows consensus guidelines: Managing Incidental Thyroid Nodules Detected on Imaging: White Paper of the ACR Incidental Thyroid Findings Committee. J Am Coll Radiol 2015; 12:143-150. and [employer] 3-tiered system for managing ITNs: J Am Coll Radiol. 2015; Feb;12(2): 143-50 Electronically Signed   By: Lovena Le M.D.   On: 05/03/2019 22:34   CT Cervical Spine Wo Contrast  Result Date: 05/03/2019 CLINICAL DATA:  Fall, facial lacerations EXAM: CT HEAD WITHOUT CONTRAST CT MAXILLOFACIAL WITHOUT CONTRAST CT CERVICAL SPINE WITHOUT CONTRAST TECHNIQUE: Multidetector CT imaging of the head, cervical spine, and maxillofacial structures were performed using the standard protocol without intravenous contrast. Multiplanar CT image reconstructions of the cervical spine and maxillofacial structures were also generated. COMPARISON:  MR head 12/08/2012, CT head 08/16/2009 FINDINGS: CT HEAD FINDINGS Brain: Punctate foci of hypoattenuation the bilateral basal ganglia likely reflect sequela of remote lacunar infarcts several of which were present on comparison CT from 20/11. There mixed patchy and confluent areas of hypoattenuation throughout the subcortical deep and periventricular white matter, similar to slightly advanced from comparison in most compatible with  chronic microvascular angiopathy. Symmetric prominence of the ventricles, cisterns and sulci compatible with parenchymal volume loss. Slight ex vacuo dilatation of the ventricles. No convincing evidence of acute infarction, hemorrhage, frank hydrocephalus, extra-axial collection or mass lesion/mass effect. Vascular:  Atherosclerotic calcification of the carotid siphons. No hyperdense vessel. Skull: No calvarial fracture, scalp swelling or hematoma. Other: None CT MAXILLOFACIAL FINDINGS Osseous: The osseous structures appear diffusely demineralized which may limit detection of small or nondisplaced fractures. No fracture of the bony orbits. Nasal bones are intact. No mid face fractures are seen. The pterygoid plates are intact. There is a comminuted fracture of the right mandibular condyle which appears impacted and slightly anteriorly displaced relative to the contralateral side. No other mandibular fracture is identified. The left temporomandibular joint is more normally aligned. 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 No temporal bone fractures are identified. There is complete absence of the mandibular dentition. Maxillary dental bridge is noted with several dental implants. Orbits: The globes appear normal and symmetric with evidence of prior bilateral lens extractions. Symmetric appearance of the extraocular musculature and optic nerve sheath complexes. Normal caliber of the superior ophthalmic veins. Sinuses: Minimal thickening in the maxillary and ethmoidal sinuses. No air-fluid levels. Mastoid air cells are well aerated, better seen on CT images head. Middle ear cavities are clear. Ossicular chains appear normally configured. Soft tissues: Some pre mandibular soft tissue swelling and thickening is noted, compatible with the reported site of laceration. No other acute soft tissue abnormality is seen. CT CERVICAL SPINE FINDINGS Alignment: Slight exaggeration of the normal cervical lordosis is likely positional. Stabilization collar is not visualized at the time of exam. There is narrowing of the basion dens interval with developing and no a pseudoarticulation which is a variant finding in can be seen in degenerative change. There is slight stepwise retrolisthesis of C3-C5 on a likely  degenerative basis given spondylitic and facet changes at the involved levels. No traumatic listhesis. No abnormally widened, perched or jumped facets. Skull base and vertebrae: No acute fracture. No primary bone lesion or focal pathologic process. Soft tissues and spinal canal: No pre or paravertebral fluid or swelling. No visible canal hematoma. Disc levels: Multilevel intervertebral disc height loss with spondylitic endplate changes. Disc osteophyte complexes at the C2-C6 levels efface the ventral thecal sac with at most mild canal stenosis. Uncinate spurring and facet hypertrophic changes result in mild-to-moderate multilevel neural foraminal narrowing with slightly more severe narrowing on the right at C3-4 and C4-5. Upper chest: No acute abnormality in the upper chest or imaged lung apices. Other: Slightly heterogeneous enlarged thyroid with few punctate calcifications. IMPRESSION: 1. No acute intracranial abnormality. No scalp swelling or calvarial fracture. 2. 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 3. Chronic microvascular angiopathic changes and parenchymal volume loss, somewhat progressed since comparison studies. 4. Comminuted, impacted and anteriorly displaced fracture of the right mandibular condyle. No other mandibular fracture identified. Associated pre mandibular soft tissue swelling which likely correlates with site of reported laceration. 5. No other facial bone fracture. 6. No acute fracture or traumatic listhesis of the cervical spine. 7. Multilevel intervertebral disc height loss and spondylitic changes detailed above. 8. Heterogeneous, nodular thyroid. If further evaluation is warranted on a clinical basis following discussion of patient's risk factors and comorbidities, consider follow-up ultrasound. This follows consensus guidelines: Managing Incidental Thyroid Nodules Detected on Imaging: White Paper of the ACR Incidental Thyroid Findings Committee. J Am Coll Radiol 2015;  12:143-150. and [facility] 3-tiered system for  managing ITNs: J Am Coll Radiol. 2015; Feb;12(2): 143-50 Electronically Signed   By: Lovena Le M.D.   On: 05/03/2019 22:34   DG Shoulder Left  Result Date: 05/03/2019 CLINICAL DATA:  Fall with shoulder pain and deformity EXAM: LEFT SHOULDER - 2+ VIEW COMPARISON:  None. FINDINGS: AC joint is intact. Acute comminuted humeral neck fracture with close to 1 shaft diameter of displacement for the midline. Moderate valgus angulation of distal fracture fragment. Mildly displaced greater tuberosity fracture fragment. IMPRESSION: Acute displaced and angulated proximal humerus fracture involving the humeral neck and greater tuberosity. Electronically Signed   By: Donavan Foil M.D.   On: 05/03/2019 22:04   CT Maxillofacial WO CM  Result Date: 05/03/2019 CLINICAL DATA:  Fall, facial lacerations EXAM: CT HEAD WITHOUT CONTRAST CT MAXILLOFACIAL WITHOUT CONTRAST CT CERVICAL SPINE WITHOUT CONTRAST TECHNIQUE: Multidetector CT imaging of the head, cervical spine, and maxillofacial structures were performed using the standard protocol without intravenous contrast. Multiplanar CT image reconstructions of the cervical spine and maxillofacial structures were also generated. COMPARISON:  MR head 12/08/2012, CT head 08/16/2009 FINDINGS: CT HEAD FINDINGS Brain: Punctate foci of hypoattenuation the bilateral basal ganglia likely reflect sequela of remote lacunar infarcts several of which were present on comparison CT from 20/11. There mixed patchy and confluent areas of hypoattenuation throughout the subcortical deep and periventricular white matter, similar to slightly advanced from comparison in most compatible with chronic microvascular angiopathy. Symmetric prominence of the ventricles, cisterns and sulci compatible with parenchymal volume loss. Slight ex vacuo dilatation of the ventricles. No convincing evidence of acute infarction, hemorrhage, frank hydrocephalus, extra-axial  collection or mass lesion/mass effect. Vascular: Atherosclerotic calcification of the carotid siphons. No hyperdense vessel. Skull: No calvarial fracture, scalp swelling or hematoma. Other: None CT MAXILLOFACIAL FINDINGS Osseous: The osseous structures appear diffusely demineralized which may limit detection of small or nondisplaced fractures. No fracture of the bony orbits. Nasal bones are intact. No mid face fractures are seen. The pterygoid plates are intact. There is a comminuted fracture of the right mandibular condyle which appears impacted and slightly anteriorly displaced relative to the contralateral side. No other mandibular fracture is identified. The left temporomandibular joint is more normally aligned. 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 No temporal bone fractures are identified. There is complete absence of the mandibular dentition. Maxillary dental bridge is noted with several dental implants. Orbits: The globes appear normal and symmetric with evidence of prior bilateral lens extractions. Symmetric appearance of the extraocular musculature and optic nerve sheath complexes. Normal caliber of the superior ophthalmic veins. Sinuses: Minimal thickening in the maxillary and ethmoidal sinuses. No air-fluid levels. Mastoid air cells are well aerated, better seen on CT images head. Middle ear cavities are clear. Ossicular chains appear normally configured. Soft tissues: Some pre mandibular soft tissue swelling and thickening is noted, compatible with the reported site of laceration. No other acute soft tissue abnormality is seen. CT CERVICAL SPINE FINDINGS Alignment: Slight exaggeration of the normal cervical lordosis is likely positional. Stabilization collar is not visualized at the time of exam. There is narrowing of the basion dens interval with developing and no a pseudoarticulation which is a variant finding in can be seen in degenerative change. There is slight  stepwise retrolisthesis of C3-C5 on a likely degenerative basis given spondylitic and facet changes at the involved levels. No traumatic listhesis. No abnormally widened, perched or jumped facets. Skull base and vertebrae: No acute fracture. No primary bone lesion or focal pathologic process.  Soft tissues and spinal canal: No pre or paravertebral fluid or swelling. No visible canal hematoma. Disc levels: Multilevel intervertebral disc height loss with spondylitic endplate changes. Disc osteophyte complexes at the C2-C6 levels efface the ventral thecal sac with at most mild canal stenosis. Uncinate spurring and facet hypertrophic changes result in mild-to-moderate multilevel neural foraminal narrowing with slightly more severe narrowing on the right at C3-4 and C4-5. Upper chest: No acute abnormality in the upper chest or imaged lung apices. Other: Slightly heterogeneous enlarged thyroid with few punctate calcifications. IMPRESSION: 1. No acute intracranial abnormality. No scalp swelling or calvarial fracture. 2. 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 3. Chronic microvascular angiopathic changes and parenchymal volume loss, somewhat progressed since comparison studies. 4. Comminuted, impacted and anteriorly displaced fracture of the right mandibular condyle. No other mandibular fracture identified. Associated pre mandibular soft tissue swelling which likely correlates with site of reported laceration. 5. No other facial bone fracture. 6. No acute fracture or traumatic listhesis of the cervical spine. 7. Multilevel intervertebral disc height loss and spondylitic changes detailed above. 8. Heterogeneous, nodular thyroid. If further evaluation is warranted on a clinical basis following discussion of patient's risk factors and comorbidities, consider follow-up ultrasound. This follows consensus guidelines: Managing Incidental Thyroid Nodules Detected on Imaging: White Paper of the ACR Incidental Thyroid  Findings Committee. J Am Coll Radiol 2015; 12:143-150. and [facility] 3-tiered system for managing ITNs: J Am Coll Radiol. 2015; Feb;12(2): 143-50 Electronically Signed   By: Lovena Le M.D.   On: 05/03/2019 22:34        Scheduled Meds: . amLODipine  5 mg Oral Daily  . aspirin EC  81 mg Oral Daily  . enoxaparin (LOVENOX) injection  40 mg Subcutaneous Q24H  . lamoTRIgine  150 mg Oral BID  . memantine  10 mg Oral BID  . polyethylene glycol  17 g Oral Daily  . [START ON 05/06/2019] pramipexole  0.25 mg Oral Daily  . senna-docusate  1 tablet Oral BID  . simvastatin  10 mg Oral Daily  . tiotropium  18 mcg Inhalation Daily   Continuous Infusions: . sodium chloride 75 mL/hr (05/04/19 1225)  . dextrose 5 % and 0.9% NaCl 50 mL/hr at 05/05/19 0608  . levETIRAcetam 250 mg (05/05/19 [id number])   And  . levETIRAcetam 500 mg (05/04/19 2155)     LOS: 1 day    Time spent: [place], MD [facility] Pager [phone]   If 7PM-7AM, please contact night-coverage [email or web address] Password [facility] 05/05/2019, 3:01 PM

## 2019-05-06 ENCOUNTER — Inpatient Hospital Stay (HOSPITAL_COMMUNITY): Payer: Medicare PPO | Admitting: Anesthesiology

## 2019-05-06 ENCOUNTER — Encounter (HOSPITAL_COMMUNITY)
Admission: EM | Disposition: A | Discharge status: Skilled Nursing Facility | Payer: Self-pay | Source: Skilled Nursing Facility | Attending: Internal Medicine

## 2019-05-06 DIAGNOSIS — I10 Essential (primary) hypertension: Secondary | ICD-10-CM

## 2019-05-06 HISTORY — PX: REVERSE SHOULDER ARTHROPLASTY: SHX5054

## 2019-05-06 LAB — COMPREHENSIVE METABOLIC PANEL
ALT: 14 U/L (ref 0–44)
AST: 16 U/L (ref 15–41)
Albumin: 3.4 g/dL — ABNORMAL LOW (ref 3.5–5.0)
Alkaline Phosphatase: 68 U/L (ref 38–126)
Anion gap: 8 (ref 5–15)
BUN: 25 mg/dL — ABNORMAL HIGH (ref 8–23)
CO2: 25 mmol/L (ref 22–32)
Calcium: 8.8 mg/dL — ABNORMAL LOW (ref 8.9–10.3)
Chloride: 109 mmol/L (ref 98–111)
Creatinine, Ser: 0.58 mg/dL (ref 0.44–1.00)
GFR calc Af Amer: >60 mL/min (ref >60)
GFR calc non Af Amer: >60 mL/min (ref >60)
Glucose, Bld: 112 mg/dL — ABNORMAL HIGH (ref 70–99)
Potassium: 3.3 mmol/L — ABNORMAL LOW (ref 3.5–5.1)
Sodium: 142 mmol/L (ref 135–145)
Total Bilirubin: 1 mg/dL (ref 0.3–1.2)
Total Protein: 6.1 g/dL — ABNORMAL LOW (ref 6.5–8.1)

## 2019-05-06 LAB — CBC
HCT: 37.1 % (ref 36.0–46.0)
Hemoglobin: 11.8 g/dL — ABNORMAL LOW (ref 12.0–15.0)
MCH: 30.4 pg (ref 26.0–34.0)
MCHC: 31.8 g/dL (ref 30.0–36.0)
MCV: 95.6 fL (ref 80.0–100.0)
Platelets: 178 10*3/uL (ref 150–400)
RBC: 3.88 MIL/uL (ref 3.87–5.11)
RDW: 13.2 % (ref 11.5–15.5)
WBC: 8.6 10*3/uL (ref 4.0–10.5)
nRBC: 0 % (ref 0.0–0.2)

## 2019-05-06 LAB — MAGNESIUM: Magnesium: 2.2 mg/dL (ref 1.7–2.4)

## 2019-05-06 LAB — PHOSPHORUS: Phosphorus: 1.6 mg/dL — ABNORMAL LOW (ref 2.5–4.6)

## 2019-05-06 SURGERY — ARTHROPLASTY, SHOULDER, TOTAL, REVERSE
Anesthesia: General | Site: Shoulder | Laterality: Left

## 2019-05-06 MED ORDER — LIDOCAINE 2% (20 MG/ML) 5 ML SYRINGE
INTRAMUSCULAR | Status: DC | PRN
  Administered 2019-05-06: 60 mg via INTRAVENOUS

## 2019-05-06 MED ORDER — POTASSIUM CHLORIDE CRYS ER 20 MEQ PO TBCR
40.0000 meq | EXTENDED_RELEASE_TABLET | Freq: Once | ORAL | Status: AC
  Administered 2019-05-06: 40 meq via ORAL
  Filled 2019-05-06: qty 2

## 2019-05-06 MED ORDER — DOCUSATE SODIUM 100 MG PO CAPS
100.0000 mg | ORAL_CAPSULE | Freq: Two times a day (BID) | ORAL | Status: DC
  Administered 2019-05-06 – 2019-05-13 (×13): 100 mg via ORAL
  Filled 2019-05-06 (×15): qty 1

## 2019-05-06 MED ORDER — EPHEDRINE SULFATE-NACL 50-0.9 MG/10ML-% IV SOSY
PREFILLED_SYRINGE | INTRAVENOUS | Status: DC | PRN
  Administered 2019-05-06: 5 mg via INTRAVENOUS

## 2019-05-06 MED ORDER — BISACODYL 5 MG PO TBEC: 5.0000 mg | DELAYED_RELEASE_TABLET | Freq: Every day | ORAL | Status: DC | PRN

## 2019-05-06 MED ORDER — MENTHOL 3 MG MT LOZG: 1.0000 | LOZENGE | OROMUCOSAL | Status: DC | PRN

## 2019-05-06 MED ORDER — PHENOL 1.4 % MT LIQD
1.0000 | OROMUCOSAL | Status: DC | PRN
  Filled 2019-05-06: qty 177

## 2019-05-06 MED ORDER — FENTANYL CITRATE (PF) 100 MCG/2ML IJ SOLN
50.0000 ug | INTRAMUSCULAR | Status: DC
  Administered 2019-05-06: 16:00:00 100 ug via INTRAVENOUS
  Filled 2019-05-06: qty 2

## 2019-05-06 MED ORDER — STERILE WATER FOR IRRIGATION IR SOLN
Status: DC | PRN
  Administered 2019-05-06: 2000 mL

## 2019-05-06 MED ORDER — BUPIVACAINE LIPOSOME 1.3 % IJ SUSP
INTRAMUSCULAR | Status: DC | PRN
  Administered 2019-05-06: 10 mL via PERINEURAL

## 2019-05-06 MED ORDER — SODIUM CHLORIDE 0.9 % IR SOLN
Status: DC | PRN
  Administered 2019-05-06: 1000 mL

## 2019-05-06 MED ORDER — PHENYLEPHRINE 40 MCG/ML (10ML) SYRINGE FOR IV PUSH (FOR BLOOD PRESSURE SUPPORT)
PREFILLED_SYRINGE | INTRAVENOUS | Status: DC | PRN
  Administered 2019-05-06: 80 ug via INTRAVENOUS
  Administered 2019-05-06: 120 ug via INTRAVENOUS
  Administered 2019-05-06: 80 ug via INTRAVENOUS
  Administered 2019-05-06: 120 ug via INTRAVENOUS

## 2019-05-06 MED ORDER — METOCLOPRAMIDE HCL 5 MG PO TABS: 5.0000 mg | ORAL_TABLET | Freq: Three times a day (TID) | ORAL | Status: DC | PRN

## 2019-05-06 MED ORDER — DEXAMETHASONE SODIUM PHOSPHATE 4 MG/ML IJ SOLN
INTRAMUSCULAR | Status: DC | PRN
  Administered 2019-05-06: 5 mg via INTRAVENOUS

## 2019-05-06 MED ORDER — LIDOCAINE 2% (20 MG/ML) 5 ML SYRINGE
INTRAMUSCULAR | Status: AC
  Filled 2019-05-06: qty 5

## 2019-05-06 MED ORDER — BUPIVACAINE HCL (PF) 0.5 % IJ SOLN
INTRAMUSCULAR | Status: DC | PRN
  Administered 2019-05-06: 13 mL via PERINEURAL

## 2019-05-06 MED ORDER — ONDANSETRON HCL 4 MG/2ML IJ SOLN
INTRAMUSCULAR | Status: AC
  Filled 2019-05-06: qty 2

## 2019-05-06 MED ORDER — METOCLOPRAMIDE HCL 5 MG/ML IJ SOLN: 5.0000 mg | Freq: Three times a day (TID) | INTRAMUSCULAR | Status: DC | PRN

## 2019-05-06 MED ORDER — ONDANSETRON HCL 4 MG/2ML IJ SOLN: 4.0000 mg | Freq: Four times a day (QID) | INTRAMUSCULAR | Status: DC | PRN

## 2019-05-06 MED ORDER — MAGNESIUM CITRATE PO SOLN: 1.0000 | Freq: Once | ORAL | Status: DC | PRN

## 2019-05-06 MED ORDER — ROCURONIUM BROMIDE 10 MG/ML (PF) SYRINGE
PREFILLED_SYRINGE | INTRAVENOUS | Status: AC
  Filled 2019-05-06: qty 10

## 2019-05-06 MED ORDER — CHLORHEXIDINE GLUCONATE 4 % EX LIQD
60.0000 mL | Freq: Once | CUTANEOUS | Status: AC
  Administered 2019-05-06: 4 via TOPICAL
  Filled 2019-05-06: qty 15

## 2019-05-06 MED ORDER — MIDAZOLAM HCL 2 MG/2ML IJ SOLN: 1.0000 mg | INTRAMUSCULAR | Status: DC

## 2019-05-06 MED ORDER — BUPIVACAINE HCL (PF) 0.5 % IJ SOLN: INTRAMUSCULAR | Status: DC | PRN

## 2019-05-06 MED ORDER — LACTATED RINGERS IV SOLN: INTRAVENOUS | Status: DC

## 2019-05-06 MED ORDER — POTASSIUM & SODIUM PHOSPHATES 280-160-250 MG PO PACK
1.0000 | PACK | Freq: Three times a day (TID) | ORAL | Status: DC
  Administered 2019-05-06 (×2): 1 via ORAL
  Filled 2019-05-06 (×6): qty 1

## 2019-05-06 MED ORDER — TRANEXAMIC ACID-NACL 1000-0.7 MG/100ML-% IV SOLN
1000.0000 mg | INTRAVENOUS | Status: AC
  Administered 2019-05-06: 1000 mg via INTRAVENOUS
  Filled 2019-05-06: qty 100

## 2019-05-06 MED ORDER — POLYETHYLENE GLYCOL 3350 17 G PO PACK: 17.0000 g | PACK | Freq: Every day | ORAL | Status: DC | PRN

## 2019-05-06 MED ORDER — PHENYLEPHRINE HCL-NACL 10-0.9 MG/250ML-% IV SOLN
INTRAVENOUS | Status: DC | PRN
  Administered 2019-05-06: 50 ug/min via INTRAVENOUS

## 2019-05-06 MED ORDER — ONDANSETRON HCL 4 MG PO TABS: 4.0000 mg | ORAL_TABLET | Freq: Four times a day (QID) | ORAL | Status: DC | PRN

## 2019-05-06 MED ORDER — CEFAZOLIN SODIUM-DEXTROSE 2-4 GM/100ML-% IV SOLN
2.0000 g | INTRAVENOUS | Status: AC
  Administered 2019-05-06: 16:00:00 2 g via INTRAVENOUS
  Filled 2019-05-06: qty 100

## 2019-05-06 MED ORDER — PROPOFOL 10 MG/ML IV BOLUS
INTRAVENOUS | Status: DC | PRN
  Administered 2019-05-06: 100 mg via INTRAVENOUS

## 2019-05-06 MED ORDER — PROPOFOL 10 MG/ML IV BOLUS
INTRAVENOUS | Status: AC
  Filled 2019-05-06: qty 20

## 2019-05-06 MED ORDER — DIPHENHYDRAMINE HCL 12.5 MG/5ML PO ELIX: 12.5000 mg | ORAL_SOLUTION | ORAL | Status: DC | PRN

## 2019-05-06 MED ORDER — DEXAMETHASONE SODIUM PHOSPHATE 10 MG/ML IJ SOLN
INTRAMUSCULAR | Status: AC
  Filled 2019-05-06: qty 1

## 2019-05-06 MED ORDER — ALBUMIN HUMAN 5 % IV SOLN: INTRAVENOUS | Status: DC | PRN

## 2019-05-06 MED ORDER — LIP MEDEX EX OINT
TOPICAL_OINTMENT | CUTANEOUS | Status: AC
  Filled 2019-05-06: qty 7

## 2019-05-06 MED ORDER — SUCCINYLCHOLINE CHLORIDE 200 MG/10ML IV SOSY
PREFILLED_SYRINGE | INTRAVENOUS | Status: DC | PRN
  Administered 2019-05-06: 100 mg via INTRAVENOUS

## 2019-05-06 SURGICAL SUPPLY — 69 items
BAG ZIPLOCK 12X15 (MISCELLANEOUS) ×2 IMPLANT
BLADE SAW SGTL 83.5X18.5 (BLADE) IMPLANT
COOLER ICEMAN CLASSIC (MISCELLANEOUS) IMPLANT
COVER BACK TABLE 60X90IN (DRAPES) ×2 IMPLANT
COVER MAYO STAND STRL (DRAPES) ×2 IMPLANT
COVER SURGICAL LIGHT HANDLE (MISCELLANEOUS) ×2 IMPLANT
COVER WAND RF STERILE (DRAPES) IMPLANT
CUP SUT UNIV REVERS 36 NEUTRAL (Cup) ×2 IMPLANT
DERMABOND ADVANCED (GAUZE/BANDAGES/DRESSINGS) ×1
DERMABOND ADVANCED .7 DNX12 (GAUZE/BANDAGES/DRESSINGS) ×1 IMPLANT
DRAPE INCISE IOBAN 66X45 STRL (DRAPES) IMPLANT
DRAPE ORTHO SPLIT 77X108 STRL (DRAPES) ×2
DRAPE SHEET LG 3/4 BI-LAMINATE (DRAPES) ×2 IMPLANT
DRAPE SURG 17X11 SM STRL (DRAPES) ×2 IMPLANT
DRAPE SURG ORHT 6 SPLT 77X108 (DRAPES) ×2 IMPLANT
DRAPE U-SHAPE 47X51 STRL (DRAPES) ×2 IMPLANT
DRESSING AQUACEL AG SP 3.5X10 (GAUZE/BANDAGES/DRESSINGS) ×1 IMPLANT
DRSG AQUACEL AG ADV 3.5X10 (GAUZE/BANDAGES/DRESSINGS) ×2 IMPLANT
DRSG AQUACEL AG SP 3.5X10 (GAUZE/BANDAGES/DRESSINGS) ×2
DURAPREP 26ML APPLICATOR (WOUND CARE) ×2 IMPLANT
ELECT BLADE TIP CTD 4 INCH (ELECTRODE) ×2 IMPLANT
ELECT REM PT RETURN 15FT ADLT (MISCELLANEOUS) ×2 IMPLANT
FACESHIELD WRAPAROUND (MASK) ×2 IMPLANT
GLENOID UNI REV MOD 24 +2 LAT (Joint) ×2 IMPLANT
GLENOSPHERE 36 +4 LAT/24 (Joint) ×2 IMPLANT
GLOVE BIO SURGEON STRL SZ7.5 (GLOVE) ×2 IMPLANT
GLOVE BIO SURGEON STRL SZ8 (GLOVE) ×2 IMPLANT
GLOVE SS BIOGEL STRL SZ 7 (GLOVE) ×1 IMPLANT
GLOVE SS BIOGEL STRL SZ 7.5 (GLOVE) ×1 IMPLANT
GLOVE SUPERSENSE BIOGEL SZ 7 (GLOVE) ×1
GLOVE SUPERSENSE BIOGEL SZ 7.5 (GLOVE) ×1
GLOVE SURG SYN 7.0 (GLOVE) IMPLANT
GLOVE SURG SYN 7.5  E (GLOVE)
GLOVE SURG SYN 7.5 E (GLOVE) IMPLANT
GLOVE SURG SYN 8.0 (GLOVE) IMPLANT
GOWN STRL REUS W/TWL LRG LVL3 (GOWN DISPOSABLE) ×4 IMPLANT
INSERT HUMERAL 36 +6 (Shoulder) ×2 IMPLANT
KIT BASIN OR (CUSTOM PROCEDURE TRAY) ×2 IMPLANT
KIT TURNOVER KIT A (KITS) IMPLANT
MANIFOLD NEPTUNE II (INSTRUMENTS) ×2 IMPLANT
NEEDLE TAPERED W/ NITINOL LOOP (MISCELLANEOUS) ×2 IMPLANT
NS IRRIG 1000ML POUR BTL (IV SOLUTION) ×2 IMPLANT
PACK SHOULDER (CUSTOM PROCEDURE TRAY) ×2 IMPLANT
PAD ARMBOARD 7.5X6 YLW CONV (MISCELLANEOUS) ×2 IMPLANT
PAD COLD SHLDR WRAP-ON (PAD) IMPLANT
PIN SET MODULAR GLENOID SYSTEM (PIN) ×2 IMPLANT
RESTRAINT HEAD UNIVERSAL NS (MISCELLANEOUS) ×2 IMPLANT
SCREW CENTRAL MOD 30MM (Screw) ×2 IMPLANT
SCREW PERI LOCK 5.5X16 (Screw) ×2 IMPLANT
SCREW PERI LOCK 5.5X24 (Screw) ×4 IMPLANT
SCREW PERIPHERAL 5.5X20 LOCK (Screw) ×2 IMPLANT
SLING ARM FOAM STRAP LRG (SOFTGOODS) ×2 IMPLANT
SLING ARM FOAM STRAP MED (SOFTGOODS) IMPLANT
SPONGE LAP 18X18 RF (DISPOSABLE) IMPLANT
STEM HUMERAL UNIVERS SZ8 (Stem) ×2 IMPLANT
SUCTION FRAZIER HANDLE 12FR (TUBING) ×1
SUCTION TUBE FRAZIER 12FR DISP (TUBING) ×1 IMPLANT
SUT FIBERTAPE CERCLAGE 2 48 (Miscellaneous) ×2 IMPLANT
SUT FIBERWIRE #2 38 T-5 BLUE (SUTURE)
SUT MNCRL AB 3-0 PS2 18 (SUTURE) ×2 IMPLANT
SUT MON AB 2-0 CT1 36 (SUTURE) ×2 IMPLANT
SUT VIC AB 1 CT1 36 (SUTURE) ×2 IMPLANT
SUTURE FIBERWR #2 38 T-5 BLUE (SUTURE) IMPLANT
SUTURE TAPE 1.3 40 TPR END (SUTURE) ×2 IMPLANT
SUTURETAPE 1.3 40 TPR END (SUTURE) ×4
TOWEL OR 17X26 10 PK STRL BLUE (TOWEL DISPOSABLE) ×4 IMPLANT
TOWEL OR NON WOVEN STRL DISP B (DISPOSABLE) ×2 IMPLANT
WATER STERILE IRR 1000ML POUR (IV SOLUTION) ×4 IMPLANT
YANKAUER SUCT BULB TIP 10FT TU (MISCELLANEOUS) ×2 IMPLANT

## 2019-05-06 NOTE — Anesthesia Preprocedure Evaluation (Signed)
Anesthesia Evaluation  Patient identified by MRN, date of birth, ID band Patient awake    Reviewed: Allergy & Precautions, NPO status , Patient's Chart, lab work & pertinent test results  Airway Mallampati: III  TM Distance: >3 FB Neck ROM: Full  Mouth opening: Limited Mouth Opening  Dental  (+) Dental Advisory Given   Pulmonary COPD,  COPD inhaler, former smoker,    breath sounds clear to auscultation       Cardiovascular hypertension, Pt. on medications + CAD   Rhythm:Regular Rate:Normal     Neuro/Psych Seizures -,     GI/Hepatic negative GI ROS, Neg liver ROS,   Endo/Other  negative endocrine ROS  Renal/GU negative Renal ROS     Musculoskeletal  (+) Arthritis ,   Abdominal   Peds  Hematology negative hematology ROS (+)   Anesthesia Other Findings   Reproductive/Obstetrics                             Lab Results  Component Value Date   WBC 8.6 05/06/2019   HGB 11.8 (L) 05/06/2019   HCT 37.1 05/06/2019   MCV 95.6 05/06/2019   PLT 178 05/06/2019   Lab Results  Component Value Date   CREATININE 0.58 05/06/2019   BUN 25 (H) 05/06/2019   NA 142 05/06/2019   K 3.3 (L) 05/06/2019   CL 109 05/06/2019   CO2 25 05/06/2019    Anesthesia Physical Anesthesia Plan  ASA: III  Anesthesia Plan: General   Post-op Pain Management:  Regional for Post-op pain   Induction: Intravenous and Rapid sequence  PONV Risk Score and Plan: 3 and Dexamethasone, Ondansetron and Treatment may vary due to age or medical condition  Airway Management Planned: Oral ETT and Video Laryngoscope Planned  Additional Equipment:   Intra-op Plan:   Post-operative Plan: Extubation in OR  Informed Consent: I have reviewed the patients History and Physical, chart, labs and discussed the procedure including the risks, benefits and alternatives for the proposed anesthesia with the patient or authorized  representative who has indicated his/her understanding and acceptance.     Dental advisory given  Plan Discussed with: CRNA  Anesthesia Plan Comments:         Anesthesia Quick Evaluation

## 2019-05-06 NOTE — Anesthesia Procedure Notes (Signed)
Procedure Name: Intubation Date/Time: 05/06/2019 4:09 PM Performed by: Claudia Desanctis, CRNA Pre-anesthesia Checklist: Patient identified, Emergency Drugs available, Suction available and Patient being monitored Patient Re-evaluated:Patient Re-evaluated prior to induction Oxygen Delivery Method: Circle system utilized Preoxygenation: Pre-oxygenation with 100% oxygen Induction Type: IV induction and Rapid sequence Laryngoscope Size: Glidescope and 3 Grade View: Grade I Tube type: Parker flex tip Tube size: 7.0 mm Number of attempts: 1 Airway Equipment and Method: Stylet Placement Confirmation: ETT inserted through vocal cords under direct vision,  positive ETCO2 and breath sounds checked- equal and bilateral Secured at: 21 cm Tube secured with: Tape Dental Injury: Teeth and Oropharynx as per pre-operative assessment  Comments: No positive pressure masking done due to mandible fracture

## 2019-05-06 NOTE — Op Note (Signed)
05/06/2019  6:04 PM  PATIENT:   [name]  86 y.o. female  PRE-OPERATIVE DIAGNOSIS: Displaced left four-part proximal humerus fracture  POST-OPERATIVE DIAGNOSIS: Same  PROCEDURE: Left shoulder reverse arthroplasty utilizing a press-fit size 8 Arthrex stem with a +6 polyethylene insert, 36/+4 glenosphere on a small/+2 baseplate  SURGEON:  Zafira Munos, Metta Clines M.D.  ASSISTANTS: Jenetta Loges, PA-C  ANESTHESIA:   General endotracheal and interscalene block with Exparel  EBL: 150 cc  SPECIMEN: None  Drains: None   PATIENT DISPOSITION:  PACU - hemodynamically stable.    PLAN OF CARE: Admit to inpatient   Brief history:  Patient is an 86 year old female resident of a local assisted living facility status post ground-level fall sustaining a severely displaced and comminuted left four-part proximal humerus fracture as well as facial trauma with associated mandibular condyle fracture.  She has been admitted to the medical service for management of her underlying medical comorbidities and due to the degree of displacement at her left proximal humeral fracture she is brought to the operating room at this time for planned reverse shoulder arthroplasty.  Preoperatively I counseled [name] regarding treatment options as well as the potential risks versus benefits thereof.  Possible surgical complications were reviewed including bleeding, infection, neurovascular injury, persistent pain, loss of motion, failure of the implant, anesthetic complication, and possible need for additional surgery.  These risks were weighed against the potential for profoundly restricted mobility and function with the shoulder fracture left alone.  She understands, and accepts, and agrees with the plan for reverse shoulder arthroplasty.  Additionally, preoperatively I did speak with Dr. Marla Roe regarding the mandibular fracture and how this might impact the airway the time of surgery and the anesthesia department  as well was aware and utilized appropriate techniques for absolute protection of the mandible.  Procedure in detail:  After undergoing routine preop evaluation patient received prophylactic antibiotics and interscalene block with Exparel was established in the holding area by the anesthesia department.  Patient subsequently placed supine on the operating table and underwent the smooth induction of a general endotracheal anesthesia utilizing the glide scope and carefully protecting the mandible.  Patient's skin was noted to be extremely friable so extreme care was taken as far as positioning prepping and draping.  She was placed into the beachchair position and carefully padded and protected.  The left shoulder girdle region was sterilely prepped and draped in standard fashion.  Timeout was called.  An anterior deltopectoral approach left shoulder is made through an approximate 10 cm incision.  Skin flaps were elevated dissection carried deeply and the deltopectoral interval was then developed from proximal to distal with the vein taken laterally.  The tissue planes were extremely hemorrhagic.  The upper centimeter of the pectoralis major tendon was tenotomized for exposure the conjoined tendon was mobilized and retracted medially.  The fracture was severely displaced with the humeral shaft in a subcoracoid region.  The fracture was carefully disimpacted and we placed tag sutures through the bone tendon junction of both the greater and lesser tuberosities and then used an osteotome to separate the dominant fragment of the articular surface and associated subchondral bone and this was carefully removed in a piecemeal fashion.  We trimmed down the remnants of the greater tuberosity to appropriate size and lesser tuberosity had only a small bone fragment with it.  Additional bony debris was then carefully removed.  Once we had the tuberosities tagged and mobilized they were then reflected anteriorly and posterior  superiorly respectively allowing access to the glenoid which we exposed with appropriate retractors gaining circumferential visualization.  I should mention that the articular surfaces were extremely eroded with evidence for underlying chronic advanced arthritis.  A guidepin was then directed into the center of the glenoid at 10 approximately 10 degree inferior tilt and the glenoid was then prepared utilizing the central followed by the peripheral reamers and all marginal debris was then carefully removed.  Glenoid was then terminally prepared with the central drill and tap and a 30 mm lag screw was assembled onto her baseplate and the baseplate was then inserted with excellent fit and fixation.  The peripheral locking screws were all then placed with good fixation.  The 36/+4 glenosphere was then impacted onto the baseplate and the central locking screw was tightened.  We then returned our attention to the humeral canal where this was hand reamed and ultimately broached up to a size 8.  There is very little proximal bone for reaming of the metaphysis.  A trial implant was then placed and this showed good soft tissue balance and good position of the implant.  Our trial was then removed.  We did notice some thinning of the cortical bone proximally with the potential for metaphyseal fracture propagation.  With this finding we passed an Arthrex FiberWire cerclage device around the humeral metaphysis and this was then tightened for support of the metaphysis and once this was completed we then assembled our final size 8 implant and this was then impacted to the appropriate level and this allowed excellent fit and fixation with good stability.  We then performed a series of trial reductions and ultimately felt that the +6 polyethylene insert gave [place] the best soft tissue balance.  Trial was then removed and the final +6 polywas then impacted into position.  Final reduction was then performed and we confirmed that the  greater tuberosity could be appropriately relocated adjacent to the metaphysis and this was then fastened back to the humeral metaphysis in the collar of our implant utilizing the previously placed FiberWire sutures.  The subscapularis was then also repaired back utilizing the previously placed sutures and taking advantage of our suture limbs from the cerclage wire and also sutured directly to the suture limbs from the greater tuberosity.  Overall construct was much to our satisfaction.  Final irrigation was then completed.  Hemostasis was obtained.  The deltopectoral interval was then reapproximated with a series of figure-of-eight #1 Vicryl sutures.  2-0 Vicryl used for the subcu layer and intracuticular 3-0 Monocryl for the skin followed by Dermabond and Aquacel dressing.  Left arm was then placed in a sling the patient was awakened, extubated, and taken to the recovery room in stable condition.  Jenetta Loges, PA-C was used as an Environmental consultant throughout this case essential for help with positioning of the patient, positioning extremity, tissue manipulation, implantation of the prosthesis, wound closure, and intraoperative decision-making.  Metta Clines Tyleah Loh MD   Contact # [phone]

## 2019-05-06 NOTE — Progress Notes (Signed)
On call MD paged again to notify of 0 output and bladder scan showing 339 ml. Awaiting orders, will continue to monitor.

## 2019-05-06 NOTE — Progress Notes (Signed)
MD paged to notify that pt had 0 ml output in 8 hours. No new orders. Will continue to monitor.

## 2019-05-06 NOTE — Discharge Instructions (Signed)
° °Kevin M. Supple, M.D., F.A.A.O.S. °Orthopaedic Surgery °Specializing in Arthroscopic and Reconstructive °Surgery of the Shoulder °[phone] °[address] - [place], [place] [address] - Fax [phone] ° ° °POST-OP TOTAL SHOULDER REPLACEMENT INSTRUCTIONS ° °1. Call the office at [phone] to schedule your first post-op appointment 10-14 days from the date of your surgery. ° °2. The bandage over your incision is waterproof. You may begin showering with this dressing on. You may leave this dressing on until first follow up appointment within 2 weeks. We prefer you leave this dressing in place until follow up however after 5-7 days if you are having itching or skin irritation and would like to remove it you may do so. Go slow and tug at the borders gently to break the bond the dressing has with the skin. At this point if there is no drainage it is okay to go without a bandage or you may cover it with a light guaze and tape. You can also expect significant bruising around your shoulder that will drift down your arm and into your chest wall. This is very normal and should resolve over several days. ° ° 3. Wear your sling/immobilizer at all times except to perform the exercises below or to occasionally let your arm dangle by your side to stretch your elbow. You also need to sleep in your sling immobilizer until instructed otherwise. It is ok to remove your sling if you are sitting in a controlled environment and allow your arm to rest in a position of comfort by your side or on your lap with pillows to give your neck and skin a break from the sling. You may remove it to allow arm to dangle by side to shower. If you are up walking around and when you go to sleep at night you need to wear it. ° °4. Range of motion to your elbow, wrist, and hand are encouraged 3-5 times daily. Exercise to your hand and fingers helps to reduce swelling you may experience. ° °5. Utilize ice to the shoulder 3-5 times  minimum a day and additionally if you are experiencing pain. ° °6. Prescriptions for a pain medication and a muscle relaxant are provided for you. It is recommended that if you are experiencing pain that you pain medication alone is not controlling, add the muscle relaxant along with the pain medication which can give additional pain relief. The first 1-2 days is generally the most severe of your pain and then should gradually decrease. As your pain lessens it is recommended that you decrease your use of the pain medications to an "as needed basis'" only and to always comply with the recommended dosages of the pain medications. ° °7. Pain medications can produce constipation along with their use. If you experience this, the use of an over the counter stool softener or laxative daily is recommended.  ° °8. For additional questions or concerns, please do not hesitate to call the office. If after hours there is an answering service to forward your concerns to the physician on call. ° °9.Pain control following an exparel block ° °To help control your post-operative pain you received a nerve block  performed with Exparel which is a long acting anesthetic (numbing agent) which can provide pain relief and sensations of numbness (and relief of pain) in the operative shoulder and arm for up to 3 days. Sometimes it provides mixed relief, meaning you may still have numbness in certain areas of the arm but can still   be able to move  parts of that arm, hand, and fingers. We recommend that your prescribed pain medications  be used as needed. We do not feel it is necessary to "pre medicate" and "stay ahead" of pain.  Taking narcotic pain medications when you are not having any pain can lead to unnecessary and potentially dangerous side effects.    10. Use the ice machine as much as possible in the first 5-7 days from surgery, then you can wean its use to as needed. The ice typically needs to be replaced every 6 hours, instead of  ice you can actually freeze water bottles to put in the cooler and then fill water around them to avoid having to purchase ice. You can have spare water bottles freezing to allow you to rotate them once they have melted. Try to have a thin shirt or light cloth or towel under the ice wrap to protect your skin.   11.  We recommend that you avoid any dental work or cleaning in the first 3 months following your joint replacement. This is to help minimize the possibility of infection from the bacteria in your mouth that enters your bloodstream during dental work. We also recommend that you take an antibiotic prior to your dental work for the first year after your shoulder replacement to further help reduce that risk. Please simply contact our office for antibiotics to be sent to your pharmacy prior to dental work.  POST-OP EXERCISES  OK to allow arm to dangle to perform elbow wrist and hand range of motion

## 2019-05-06 NOTE — Anesthesia Procedure Notes (Signed)
Anesthesia Regional Block: Interscalene brachial plexus block   Pre-Anesthetic Checklist: ,, timeout performed, Correct Patient, Correct Site, Correct Laterality, Correct Procedure, Correct Position, site marked, Risks and benefits discussed,  Surgical consent,  Pre-op evaluation,  At surgeon's request and post-op pain management  Laterality: Left  Prep: chloraprep       Needles:  Injection technique: Single-shot  Needle Type: Echogenic Needle     Needle Length: 9cm  Needle Gauge: 21     Additional Needles:   Procedures:, nerve stimulator,,, ultrasound used (permanent image in chart),,,,   Nerve Stimulator or Paresthesia:  Response: deltoid, 0.5 mA,   Additional Responses:   Narrative:  Start time: 05/06/2019 3:53 PM End time: 05/06/2019 3:58 PM Injection made incrementally with aspirations every 5 mL.  Performed by: Personally  Anesthesiologist: Suzette Battiest, MD

## 2019-05-06 NOTE — Transfer of Care (Signed)
Immediate Anesthesia Transfer of Care Note  Patient: [name]  Procedure(s) Performed: REVERSE SHOULDER ARTHROPLASTY (Left Shoulder)  Patient Location: PACU  Anesthesia Type:GA combined with regional for post-op pain  Level of Consciousness: awake, alert , oriented and patient cooperative  Airway & Oxygen Therapy: Patient Spontanous Breathing and Patient connected to face mask  Post-op Assessment: Report given to RN and Post -op Vital signs reviewed and stable  Post vital signs: Reviewed and stable  Last Vitals:  Vitals Value Taken Time  BP    Temp    Pulse 79 05/06/19 1813  Resp 20 05/06/19 1813  SpO2 99 % 05/06/19 1813  Vitals shown include unvalidated device data.  Last Pain:  Vitals:   05/06/19 1527  TempSrc: Oral  PainSc:       Patients Stated Pain Goal: 2 ([id number])  Complications: No apparent anesthesia complications

## 2019-05-06 NOTE — Progress Notes (Signed)
PROGRESS NOTE    [name]  [name] DOB: 17-Dec-1932 DOA: 05/03/2019 PCP: Leighton Ruff, MD    Brief Narrative:  [name] is a 86 y.o. female with medical history significant of mild memory disorder, COPD lives in assisted living comes in after mechanical fall injuring her left upper arm.  Patient denies any recent illnesses.  She denies any loss of consciousness.  She reports left arm pain.  She suffered a lack to her chin and brow which has been repaired emergency department.  Patient found to have a left humerus fracture and a mandibular fracture both orthopedic surgery and ENT have been called who will see patient in the morning.  Her humerus will likely need surgery.  Patient be referred for admission for orthopedic injuries in a patient with multiple other medical problems.  Her pain is much better after receiving fentanyl in the emergency department.  She lives alone at her assisted living. Alert and oriented with mild cognitive impairment. Orthopedics planning for right shoulder arthroplasty tomorrow 05/06/2019. Ortho plan for reverse right shoulder arthroplasty today 1/28/2021at 4 PM.  Reported decreased urine output/retention.  Appears to have resolved.  No new complaints today.  Potassium and phosphorus low.  We will replete with potassium chloride and sodium potassium phosphate p.o.     Assessment & Plan:   Principal Problem:   Humerus fracture Active Problems:   COPD (chronic obstructive pulmonary disease) ([place])   Essential hypertension   Fall  Clinical problems list 1.  Mechanical fall with left proximal humeral neck fracture with displacement and comminution 2.  Right mandible condyle fracture 3.  Chronic obstructive pulmonary disease 4.  Essential hypertension  5.  Seizure disorder   1.  Mechanical fall with left proximal humeral neck fracture with displacement and comminution. Ortho has seen and evaluated and plan was for reverse shoulder  arthroplasty. Continue with shoulder sling Continue with pain management  2.  Right mandibular condyle fracture.  Patient was evaluated by oral maxillofacial/plastic surgery and recommended liquid diet and no surgical intervention for mandibular fracture. Ortho has discussed with Dr. Marla Roe and plan for surgical intervention tomorrow Thursday, 05/06/2019.   3.  Chronic obstructive pulmonary disease.  Not in acute exacerbation and no home oxygen use.  Continue to monitor Continue with bronchodilators as needed  4.  Essential hypertension.  Patient is currently normotensive  Continue to monitor  5.  Seizure disorder.  Stable Continue with Keppra IV and transition to p.o. after Ortho surgery.  DVT prophylaxis: Lovenox subcute  Code Status: DNR Family Communication: None at bedside  Disposition Plan: Patient comes from home  And lives at independent assisted living by herself. She will likely be discharged to subacute rehab after shoulder surgery.  Patient will need PT/OT evaluation after surgery Expected discharge date likely 05/09/2019  Consultants:   Orthopedics  Plastic surgery  Procedures: None   Antimicrobials: None    Subjective: Patient was seen and examined at bedside.  Mild cognitive impairment. Alert and not in acute distress.  Reported urinary retention with bladder scan yielding 339 cc, overnight which appears to have resolved.  Objective: Vitals:   05/06/19 0846 05/06/19 0847 05/06/19 1021 05/06/19 1346  BP:   129/77 108/76  Pulse:    89  Resp:    14  Temp:    99.9 F (37.7 C)  TempSrc:    Oral  SpO2: 91% 91%  94%  Weight:      Height:  Intake/Output Summary (Last 24 hours) at 05/06/2019 1404 Last data filed at 05/06/2019 0636 Gross per 24 hour  Intake 805.66 ml  Output 1250 ml  Net -444.34 ml   Filed Weights   05/03/19 2103  Weight: 74.8 kg    Examination:  General exam: Appears calm and comfortable.  Not in acute  distress Respiratory system: Clear to auscultation. Respiratory effort normal. Cardiovascular system: S1 & S2 heard, RRR. No JVD, murmurs, rubs, gallops or clicks. No pedal edema. Gastrointestinal system: Abdomen is nondistended, soft and nontender. No organomegaly or masses felt. Normal bowel sounds heard. Central nervous system: Alert and oriented. No focal neurological deficits. Extremities: On left shoulder sling . Skin: No rashes, lesions or ulcers Psychiatry: Judgement and insight appear normal. Mood & affect appropriate.     Data Reviewed: I have personally reviewed following labs and imaging studies  CBC: Recent Labs  Lab 05/04/19 0025 05/04/19 0449 05/05/19 0330 05/06/19 0342  WBC 13.3* 10.5 9.8 8.6  NEUTROABS 11.3*  --   --   --   HGB 13.6 13.8 13.7 11.8*  HCT 41.6 42.6 42.0 37.1  MCV 93.7 94.2 95.0 95.6  PLT 198 203 194 0000000   Basic Metabolic Panel: Recent Labs  Lab 05/04/19 0025 05/04/19 0449 05/05/19 0330 05/06/19 0342  NA 139 139 140 142  K 3.5 3.5 3.7 3.3*  CL 104 104 105 109  CO2 24 24 25 25   GLUCOSE 132* 138* 121* 112*  BUN 31* 27* 21 25*  CREATININE 0.96 0.76 0.58 0.58  CALCIUM 9.0 8.9 8.7* 8.8*  MG  --   --  2.2 2.2  PHOS  --   --  2.6 1.6*   GFR: Estimated Creatinine Clearance: 53.6 mL/min (by C-G formula based on SCr of 0.58 mg/dL). Liver Function Tests: Recent Labs  Lab 05/04/19 0025 05/05/19 0330 05/06/19 0342  AST 23 20 16   ALT 17 15 14   ALKPHOS 76 78 68  BILITOT 1.1 1.2 1.0  PROT 6.7 6.7 6.1*  ALBUMIN 3.9 3.8 3.4*   No results for input(s): LIPASE, AMYLASE in the last 168 hours. No results for input(s): AMMONIA in the last 168 hours. Coagulation Profile: No results for input(s): INR, PROTIME in the last 168 hours. Cardiac Enzymes: No results for input(s): CKTOTAL, CKMB, CKMBINDEX, TROPONINI in the last 168 hours. BNP (last 3 results) No results for input(s): PROBNP in the last 8760 hours. HbA1C: No results for input(s):  HGBA1C in the last 72 hours. CBG: No results for input(s): GLUCAP in the last 168 hours. Lipid Profile: No results for input(s): CHOL, HDL, LDLCALC, TRIG, CHOLHDL, LDLDIRECT in the last 72 hours. Thyroid Function Tests: No results for input(s): TSH, T4TOTAL, FREET4, T3FREE, THYROIDAB in the last 72 hours. Anemia Panel: No results for input(s): VITAMINB12, FOLATE, FERRITIN, TIBC, IRON, RETICCTPCT in the last 72 hours. Sepsis Labs: No results for input(s): PROCALCITON, LATICACIDVEN in the last 168 hours.  Recent Results (from the past 240 hour(s))  Respiratory Panel by RT PCR (Flu A&B, Covid) - Nasopharyngeal Swab     Status: None   Collection Time: 05/03/19 11:30 PM   Specimen: Nasopharyngeal Swab  Result Value Ref Range Status   SARS Coronavirus 2 by RT PCR NEGATIVE NEGATIVE Final    Comment: (NOTE) SARS-CoV-2 target nucleic acids are NOT DETECTED. The SARS-CoV-2 RNA is generally detectable in upper respiratoy specimens during the acute phase of infection. The lowest concentration of SARS-CoV-2 viral copies this assay can detect is 131 copies/mL. A negative  result does not preclude SARS-Cov-2 infection and should not be used as the sole basis for treatment or other patient management decisions. A negative result may occur with  improper specimen collection/handling, submission of specimen other than nasopharyngeal swab, presence of viral mutation(s) within the areas targeted by this assay, and inadequate number of viral copies (<131 copies/mL). A negative result must be combined with clinical observations, patient history, and epidemiological information. The expected result is Negative. Fact Sheet for Patients:  PinkCheek.be Fact Sheet for Healthcare Providers:  GravelBags.it This test is not yet ap proved or cleared by the [place] FDA and  has been authorized for detection and/or diagnosis of SARS-CoV-2 by FDA  under an Emergency Use Authorization (EUA). This EUA will remain  in effect (meaning this test can be used) for the duration of the COVID-19 declaration under Section 564(b)(1) of the Act, 21 U.S.C. section 360bbb-3(b)(1), unless the authorization is terminated or revoked sooner.    Influenza A by PCR NEGATIVE NEGATIVE Final   Influenza B by PCR NEGATIVE NEGATIVE Final    Comment: (NOTE) The Xpert Xpress SARS-CoV-2/FLU/RSV assay is intended as an aid in  the diagnosis of influenza from Nasopharyngeal swab specimens and  should not be used as a sole basis for treatment. Nasal washings and  aspirates are unacceptable for Xpert Xpress SARS-CoV-2/FLU/RSV  testing. Fact Sheet for Patients: PinkCheek.be Fact Sheet for Healthcare Providers: GravelBags.it This test is not yet approved or cleared by the [place] FDA and  has been authorized for detection and/or diagnosis of SARS-CoV-2 by  FDA under an Emergency Use Authorization (EUA). This EUA will remain  in effect (meaning this test can be used) for the duration of the  Covid-19 declaration under Section 564(b)(1) of the Act, 21  U.S.C. section 360bbb-3(b)(1), unless the authorization is  terminated or revoked. Performed at [facility], [place] [address], [place], [place] [address]          Radiology Studies: No results found.      Scheduled Meds: . amLODipine  5 mg Oral Daily  . aspirin EC  81 mg Oral Daily  . lamoTRIgine  150 mg Oral BID  . memantine  10 mg Oral BID  . polyethylene glycol  17 g Oral Daily  . potassium & sodium phosphates  1 packet Oral TID WC & HS  . pramipexole  0.25 mg Oral Daily  . senna-docusate  1 tablet Oral BID  . simvastatin  10 mg Oral Daily  . tiotropium  18 mcg Inhalation Daily   Continuous Infusions: . sodium chloride 75 mL/hr (05/04/19 1225)  .  ceFAZolin (ANCEF) IV    . dextrose 5 % and 0.9% NaCl 50 mL/hr  at 05/05/19 0608  . levETIRAcetam 250 mg (05/06/19 1026)   And  . levETIRAcetam 500 mg (05/05/19 2113)  . tranexamic acid       LOS: 2 days    Time spent: [place], MD [facility] Pager [phone]   If 7PM-7AM, please contact night-coverage [email or web address] Password [facility] 05/06/2019, 2:04 PM

## 2019-05-07 ENCOUNTER — Encounter: Payer: Self-pay | Admitting: *Deleted

## 2019-05-07 LAB — URINALYSIS, ROUTINE W REFLEX MICROSCOPIC
Bacteria, UA: NONE SEEN
Bilirubin Urine: NEGATIVE
Glucose, UA: NEGATIVE mg/dL
Ketones, ur: NEGATIVE mg/dL
Leukocytes,Ua: NEGATIVE
Nitrite: NEGATIVE
Protein, ur: NEGATIVE mg/dL
Specific Gravity, Urine: 1.021 (ref 1.005–1.030)
pH: 5 (ref 5.0–8.0)

## 2019-05-07 LAB — SARS CORONAVIRUS 2 (TAT 6-24 HRS): SARS Coronavirus 2: NEGATIVE

## 2019-05-07 MED ORDER — SODIUM PHOSPHATES 45 MMOLE/15ML IV SOLN
20.0000 mmol | Freq: Once | INTRAVENOUS | Status: AC
  Administered 2019-05-07: 20 mmol via INTRAVENOUS
  Filled 2019-05-07: qty 6.67

## 2019-05-07 MED ORDER — LEVETIRACETAM 500 MG PO TABS
500.0000 mg | ORAL_TABLET | Freq: Every day | ORAL | Status: DC
  Administered 2019-05-07 – 2019-05-12 (×6): 500 mg via ORAL
  Filled 2019-05-07 (×6): qty 1

## 2019-05-07 MED ORDER — SODIUM CHLORIDE 0.9 % IV SOLN
INTRAVENOUS | Status: DC | PRN
  Administered 2019-05-07: 500 mL via INTRAVENOUS

## 2019-05-07 MED ORDER — LEVETIRACETAM 250 MG PO TABS
250.0000 mg | ORAL_TABLET | Freq: Every day | ORAL | Status: DC
  Administered 2019-05-08 – 2019-05-13 (×6): 250 mg via ORAL
  Filled 2019-05-07 (×7): qty 1

## 2019-05-07 MED ORDER — TRAMADOL HCL 50 MG PO TABS: 50.0000 mg | ORAL_TABLET | Freq: Four times a day (QID) | ORAL | 0 refills | Status: DC | PRN

## 2019-05-07 NOTE — Progress Notes (Deleted)
PROGRESS NOTE    [name]  [id number] DOB: September 22, 1932 DOA: 05/03/2019 PCP: Leighton Ruff, MD    Brief Narrative:  [name] is a 86 y.o. female with medical history significant of mild memory disorder, COPD lives in assisted living comes in after mechanical fall injuring her left upper arm.  Patient denies any recent illnesses.  She denies any loss of consciousness.  She reports left arm pain.  She suffered a lack to her chin and brow which has been repaired emergency department.  Patient found to have a left humerus fracture and a mandibular fracture both orthopedic surgery and ENT have been called who will see patient in the morning.  Her humerus will likely need surgery.  Patient be referred for admission for orthopedic injuries in a patient with multiple other medical problems.  Her pain is much better after receiving fentanyl in the emergency department.  She lives alone at her assisted living. Alert and oriented with mild cognitive impairment. Orthopedics planning for right shoulder arthroplasty tomorrow 05/06/2019. Ortho plan for reverse right shoulder arthroplasty today 1/28/2021at 4 PM.  Reported decreased urine output/retention.  Appears to have resolved.  No new complaints today.  Potassium and phosphorus low.  We will replete with potassium chloride and sodium potassium phosphate p.o. 05/07/2019: Patient is status post revised left shoulder arthroplasty by Ortho.  Sitting out in chair and eating breakfast.  Not in acute distress. Getting pain management. Seen by Ortho today.  Plan is for OT evaluation. Ortho is okay for discharge when medically stable.  We will plan to discharge home tomorrow.     Assessment & Plan:   Principal Problem:   Humerus fracture Active Problems:   COPD (chronic obstructive pulmonary disease) ([place])   Essential hypertension   Fall  Clinical problems list 1.  Mechanical fall with left proximal humeral neck fracture with displacement  and comminution 2.  Right mandible condyle fracture 3.  Chronic obstructive pulmonary disease 4.  Essential hypertension  5.  Seizure disorder 6. Hypophosphatemia   1.  Mechanical fall with left proximal humeral neck fracture with displacement and comminution. She is s/p reverse shoulder arthroplasty by ortho Continue with shoulder sling Continue with pain management  2.  Right mandibular condyle fracture.  Patient was evaluated by oral maxillofacial/plastic surgery and recommended liquid diet and no surgical intervention for mandibular fracture. Maxillofacial surgery has no plans for surgical intervention.  3.  Chronic obstructive pulmonary disease.  Not in acute exacerbation and no home oxygen use.  Continue to monitor Continue with bronchodilators as needed  4.  Essential hypertension.  Patient is currently normotensive  Continue to monitor  5.  Seizure disorder.  Stable Start p.o. Keppra in preparation for discharge home tomorrow.  .  6. Hypophosphatemia Replete with phosphate IV  DVT prophylaxis: Lovenox subcute  Code Status: DNR Family Communication: None at bedside  Disposition Plan: Patient lives in assisted living facility and will likely be discharged home back to facility on 05/08/2019 No barriers to discharge  Consultants:   Orthopedics  Plastic surgery  Procedures: None   Antimicrobials: None    Subjective: Patient was seen and examined at bedside.  Mild cognitive impairment. Alert and not in acute distress.  No acute overnight event was reported. Objective: Vitals:   05/07/19 1057 05/07/19 1058 05/07/19 1115 05/07/19 1123  BP:      Pulse:      Resp:      Temp:      TempSrc:  SpO2: 90% 90% (!) 83% 91%  Weight:      Height:        Intake/Output Summary (Last 24 hours) at 05/07/2019 1131 Last data filed at 05/07/2019 1024 Gross per 24 hour  Intake 4099.56 ml  Output 500 ml  Net 3599.56 ml   Filed Weights   05/03/19 2103 05/07/19 0500    Weight: 74.8 kg 81 kg    Examination:  General exam: Appears calm and comfortable.  Not in acute distress Respiratory system: Clear to auscultation. Respiratory effort normal. Cardiovascular system: S1 & S2 heard, RRR. No JVD, murmurs, rubs, gallops or clicks. No pedal edema. Gastrointestinal system: Abdomen is nondistended, soft and nontender. No organomegaly or masses felt. Normal bowel sounds heard. Central nervous system: Alert and oriented. No focal neurological deficits. Extremities: On left shoulder sling . Skin: No rashes, lesions or ulcers Psychiatry: Judgement and insight appear normal. Mood & affect appropriate.     Data Reviewed: I have personally reviewed following labs and imaging studies  CBC: Recent Labs  Lab 05/04/19 0025 05/04/19 0449 05/05/19 0330 05/06/19 0342  WBC 13.3* 10.5 9.8 8.6  NEUTROABS 11.3*  --   --   --   HGB 13.6 13.8 13.7 11.8*  HCT 41.6 42.6 42.0 37.1  MCV 93.7 94.2 95.0 95.6  PLT 198 203 194 0000000   Basic Metabolic Panel: Recent Labs  Lab 05/04/19 0025 05/04/19 0449 05/05/19 0330 05/06/19 0342  NA 139 139 140 142  K 3.5 3.5 3.7 3.3*  CL 104 104 105 109  CO2 24 24 25 25   GLUCOSE 132* 138* 121* 112*  BUN 31* 27* 21 25*  CREATININE 0.96 0.76 0.58 0.58  CALCIUM 9.0 8.9 8.7* 8.8*  MG  --   --  2.2 2.2  PHOS  --   --  2.6 1.6*   GFR: Estimated Creatinine Clearance: 55.6 mL/min (by C-G formula based on SCr of 0.58 mg/dL). Liver Function Tests: Recent Labs  Lab 05/04/19 0025 05/05/19 0330 05/06/19 0342  AST 23 20 16   ALT 17 15 14   ALKPHOS 76 78 68  BILITOT 1.1 1.2 1.0  PROT 6.7 6.7 6.1*  ALBUMIN 3.9 3.8 3.4*   No results for input(s): LIPASE, AMYLASE in the last 168 hours. No results for input(s): AMMONIA in the last 168 hours. Coagulation Profile: No results for input(s): INR, PROTIME in the last 168 hours. Cardiac Enzymes: No results for input(s): CKTOTAL, CKMB, CKMBINDEX, TROPONINI in the last 168 hours. BNP (last 3  results) No results for input(s): PROBNP in the last 8760 hours. HbA1C: No results for input(s): HGBA1C in the last 72 hours. CBG: No results for input(s): GLUCAP in the last 168 hours. Lipid Profile: No results for input(s): CHOL, HDL, LDLCALC, TRIG, CHOLHDL, LDLDIRECT in the last 72 hours. Thyroid Function Tests: No results for input(s): TSH, T4TOTAL, FREET4, T3FREE, THYROIDAB in the last 72 hours. Anemia Panel: No results for input(s): VITAMINB12, FOLATE, FERRITIN, TIBC, IRON, RETICCTPCT in the last 72 hours. Sepsis Labs: No results for input(s): PROCALCITON, LATICACIDVEN in the last 168 hours.  Recent Results (from the past 240 hour(s))  Respiratory Panel by RT PCR (Flu A&B, Covid) - Nasopharyngeal Swab     Status: None   Collection Time: 05/03/19 11:30 PM   Specimen: Nasopharyngeal Swab  Result Value Ref Range Status   SARS Coronavirus 2 by RT PCR NEGATIVE NEGATIVE Final    Comment: (NOTE) SARS-CoV-2 target nucleic acids are NOT DETECTED. The SARS-CoV-2 RNA is generally detectable  in upper respiratoy specimens during the acute phase of infection. The lowest concentration of SARS-CoV-2 viral copies this assay can detect is 131 copies/mL. A negative result does not preclude SARS-Cov-2 infection and should not be used as the sole basis for treatment or other patient management decisions. A negative result may occur with  improper specimen collection/handling, submission of specimen other than nasopharyngeal swab, presence of viral mutation(s) within the areas targeted by this assay, and inadequate number of viral copies (<131 copies/mL). A negative result must be combined with clinical observations, patient history, and epidemiological information. The expected result is Negative. Fact Sheet for Patients:  PinkCheek.be Fact Sheet for Healthcare Providers:  GravelBags.it This test is not yet ap proved or cleared by the  [place] FDA and  has been authorized for detection and/or diagnosis of SARS-CoV-2 by FDA under an Emergency Use Authorization (EUA). This EUA will remain  in effect (meaning this test can be used) for the duration of the COVID-19 declaration under Section 564(b)(1) of the Act, 21 U.S.C. section 360bbb-3(b)(1), unless the authorization is terminated or revoked sooner.    Influenza A by PCR NEGATIVE NEGATIVE Final   Influenza B by PCR NEGATIVE NEGATIVE Final    Comment: (NOTE) The Xpert Xpress SARS-CoV-2/FLU/RSV assay is intended as an aid in  the diagnosis of influenza from Nasopharyngeal swab specimens and  should not be used as a sole basis for treatment. Nasal washings and  aspirates are unacceptable for Xpert Xpress SARS-CoV-2/FLU/RSV  testing. Fact Sheet for Patients: PinkCheek.be Fact Sheet for Healthcare Providers: GravelBags.it This test is not yet approved or cleared by the [place] FDA and  has been authorized for detection and/or diagnosis of SARS-CoV-2 by  FDA under an Emergency Use Authorization (EUA). This EUA will remain  in effect (meaning this test can be used) for the duration of the  Covid-19 declaration under Section 564(b)(1) of the Act, 21  U.S.C. section 360bbb-3(b)(1), unless the authorization is  terminated or revoked. Performed at [facility], [place] [address], [place], [place] [address]          Radiology Studies: No results found.      Scheduled Meds: . amLODipine  5 mg Oral Daily  . aspirin EC  81 mg Oral Daily  . docusate sodium  100 mg Oral BID  . lamoTRIgine  150 mg Oral BID  . [START ON 05/08/2019] levETIRAcetam  250 mg Oral Daily   And  . levETIRAcetam  500 mg Oral QHS  . memantine  10 mg Oral BID  . polyethylene glycol  17 g Oral Daily  . pramipexole  0.25 mg Oral Daily  . senna-docusate  1 tablet Oral BID  . simvastatin  10 mg Oral Daily  .  tiotropium  18 mcg Inhalation Daily   Continuous Infusions: . sodium chloride 75 mL/hr (05/04/19 1225)  . sodium chloride 500 mL (05/07/19 1001)  . dextrose 5 % and 0.9% NaCl 50 mL/hr at 05/05/19 0608  . lactated ringers 75 mL/hr at 05/07/19 1007  . sodium phosphate  Dextrose 5% IVPB 43 mL/hr at 05/07/19 1003     LOS: 3 days    Time spent: [place], MD [facility] Pager [phone]   If 7PM-7AM, please contact night-coverage [email or web address] Password [facility] 05/07/2019, 11:31 AM

## 2019-05-07 NOTE — Progress Notes (Addendum)
PROGRESS NOTE    [name]  [name] DOB: 1932/07/23 DOA: 05/03/2019 PCP: Leighton Ruff, MD    Brief Narrative:  [name] is a 86 y.o. female with medical history significant of mild memory disorder, COPD lives in assisted living comes in after mechanical fall injuring her left upper arm.  Patient denies any recent illnesses.  She denies any loss of consciousness.  She reports left arm pain.  She suffered a lack to her chin and brow which has been repaired emergency department.  Patient found to have a left humerus fracture and a mandibular fracture both orthopedic surgery and ENT have been called who will see patient in the morning.  Her humerus will likely need surgery.  Patient be referred for admission for orthopedic injuries in a patient with multiple other medical problems.  Her pain is much better after receiving fentanyl in the emergency department.  She lives alone at her assisted living. Alert and oriented with mild cognitive impairment. Orthopedics planning for right shoulder arthroplasty tomorrow 05/06/2019. Ortho plan for reverse right shoulder arthroplasty today 1/28/2021at 4 PM.  Reported decreased urine output/retention.  Appears to have resolved.  No new complaints today.  Potassium and phosphorus low.  We will replete with potassium chloride and sodium potassium phosphate p.o. 05/07/2019: Patient is status post revised left shoulder arthroplasty by Ortho.  Sitting out in chair and eating breakfast.  Not in acute distress. Getting pain management. Seen by Ortho today.  Plan is for OT evaluation. Ortho is okay for discharge when medically stable.  We will plan to discharge home tomorrow.     Assessment & Plan:   Principal Problem:   Humerus fracture Active Problems:   COPD (chronic obstructive pulmonary disease) ([place])   Essential hypertension   Fall  Clinical problems list 1.  Mechanical fall with left proximal humeral neck fracture with displacement  and comminution 2.  Right mandible condyle fracture 3.  Chronic obstructive pulmonary disease 4.  Essential hypertension  5.  Seizure disorder 6. Hypophosphatemia   1.  Mechanical fall with left proximal humeral neck fracture with displacement and comminution. Ortho has seen and evaluated and plan was for reverse shoulder arthroplasty. Continue with shoulder sling Continue with pain management  2.  Right mandibular condyle fracture.  Patient was evaluated by oral maxillofacial/plastic surgery and recommended liquid diet and no surgical intervention for mandibular fracture. Maxillofacial surgery has no plans for surgical intervention.  3.  Chronic obstructive pulmonary disease.  Not in acute exacerbation and no home oxygen use.  Continue to monitor Continue with bronchodilators as needed  4.  Essential hypertension.  Patient is currently normotensive  Continue to monitor  5.  Seizure disorder.  Stable Start p.o. Keppra in preparation for discharge home tomorrow.    6. Hypophosphatemia Will replete with phosphate IV Continue to monitor.  DVT prophylaxis: Lovenox subcute  Code Status: DNR Family Communication: None at bedside  Disposition Plan: Patient lives in assisted living facility and will likely be discharged home back to facility tomorrow 05/08/2019 No barriers to discharge.  Consultants:   Orthopedics  Plastic surgery  Procedures: None   Antimicrobials: None    Subjective: Patient was seen and examined at bedside.  Mild cognitive impairment. Alert and not in acute distress.  Sitting out in chair and eating breakfast and denies any unusual pain.  Status post left reverse shoulder arthroplasty by Ortho on 05/06/2019  Objective: Vitals:   05/07/19 1057 05/07/19 1058 05/07/19 1115 05/07/19 1123  BP:  Pulse:      Resp:      Temp:      TempSrc:      SpO2: 90% 90% (!) 83% 91%  Weight:      Height:        Intake/Output Summary (Last 24 hours) at  05/07/2019 1131 Last data filed at 05/07/2019 1024 Gross per 24 hour  Intake 4099.56 ml  Output 500 ml  Net 3599.56 ml   Filed Weights   05/03/19 2103 05/07/19 0500  Weight: 74.8 kg 81 kg    Examination:  General exam: Appears calm and comfortable.  Not in acute distress Respiratory system: Clear to auscultation. Respiratory effort normal. Cardiovascular system: S1 & S2 heard, RRR. No JVD, murmurs, rubs, gallops or clicks. No pedal edema. Gastrointestinal system: Abdomen is nondistended, soft and nontender. No organomegaly or masses felt. Normal bowel sounds heard. Central nervous system: Alert and oriented. No focal neurological deficits. Extremities: On left shoulder sling . Skin: No rashes, lesions or ulcers Psychiatry: Judgement and insight appear normal. Mood & affect appropriate.     Data Reviewed: I have personally reviewed following labs and imaging studies  CBC: Recent Labs  Lab 05/04/19 0025 05/04/19 0449 05/05/19 0330 05/06/19 0342  WBC 13.3* 10.5 9.8 8.6  NEUTROABS 11.3*  --   --   --   HGB 13.6 13.8 13.7 11.8*  HCT 41.6 42.6 42.0 37.1  MCV 93.7 94.2 95.0 95.6  PLT 198 203 194 0000000   Basic Metabolic Panel: Recent Labs  Lab 05/04/19 0025 05/04/19 0449 05/05/19 0330 05/06/19 0342  NA 139 139 140 142  K 3.5 3.5 3.7 3.3*  CL 104 104 105 109  CO2 24 24 25 25   GLUCOSE 132* 138* 121* 112*  BUN 31* 27* 21 25*  CREATININE 0.96 0.76 0.58 0.58  CALCIUM 9.0 8.9 8.7* 8.8*  MG  --   --  2.2 2.2  PHOS  --   --  2.6 1.6*   GFR: Estimated Creatinine Clearance: 55.6 mL/min (by C-G formula based on SCr of 0.58 mg/dL). Liver Function Tests: Recent Labs  Lab 05/04/19 0025 05/05/19 0330 05/06/19 0342  AST 23 20 16   ALT 17 15 14   ALKPHOS 76 78 68  BILITOT 1.1 1.2 1.0  PROT 6.7 6.7 6.1*  ALBUMIN 3.9 3.8 3.4*   No results for input(s): LIPASE, AMYLASE in the last 168 hours. No results for input(s): AMMONIA in the last 168 hours. Coagulation Profile: No  results for input(s): INR, PROTIME in the last 168 hours. Cardiac Enzymes: No results for input(s): CKTOTAL, CKMB, CKMBINDEX, TROPONINI in the last 168 hours. BNP (last 3 results) No results for input(s): PROBNP in the last 8760 hours. HbA1C: No results for input(s): HGBA1C in the last 72 hours. CBG: No results for input(s): GLUCAP in the last 168 hours. Lipid Profile: No results for input(s): CHOL, HDL, LDLCALC, TRIG, CHOLHDL, LDLDIRECT in the last 72 hours. Thyroid Function Tests: No results for input(s): TSH, T4TOTAL, FREET4, T3FREE, THYROIDAB in the last 72 hours. Anemia Panel: No results for input(s): VITAMINB12, FOLATE, FERRITIN, TIBC, IRON, RETICCTPCT in the last 72 hours. Sepsis Labs: No results for input(s): PROCALCITON, LATICACIDVEN in the last 168 hours.  Recent Results (from the past 240 hour(s))  Respiratory Panel by RT PCR (Flu A&B, Covid) - Nasopharyngeal Swab     Status: None   Collection Time: 05/03/19 11:30 PM   Specimen: Nasopharyngeal Swab  Result Value Ref Range Status   SARS Coronavirus 2 by  RT PCR NEGATIVE NEGATIVE Final    Comment: (NOTE) SARS-CoV-2 target nucleic acids are NOT DETECTED. The SARS-CoV-2 RNA is generally detectable in upper respiratoy specimens during the acute phase of infection. The lowest concentration of SARS-CoV-2 viral copies this assay can detect is 131 copies/mL. A negative result does not preclude SARS-Cov-2 infection and should not be used as the sole basis for treatment or other patient management decisions. A negative result may occur with  improper specimen collection/handling, submission of specimen other than nasopharyngeal swab, presence of viral mutation(s) within the areas targeted by this assay, and inadequate number of viral copies (<131 copies/mL). A negative result must be combined with clinical observations, patient history, and epidemiological information. The expected result is Negative. Fact Sheet for Patients:    PinkCheek.be Fact Sheet for Healthcare Providers:  GravelBags.it This test is not yet ap proved or cleared by the [place] FDA and  has been authorized for detection and/or diagnosis of SARS-CoV-2 by FDA under an Emergency Use Authorization (EUA). This EUA will remain  in effect (meaning this test can be used) for the duration of the COVID-19 declaration under Section 564(b)(1) of the Act, 21 U.S.C. section 360bbb-3(b)(1), unless the authorization is terminated or revoked sooner.    Influenza A by PCR NEGATIVE NEGATIVE Final   Influenza B by PCR NEGATIVE NEGATIVE Final    Comment: (NOTE) The Xpert Xpress SARS-CoV-2/FLU/RSV assay is intended as an aid in  the diagnosis of influenza from Nasopharyngeal swab specimens and  should not be used as a sole basis for treatment. Nasal washings and  aspirates are unacceptable for Xpert Xpress SARS-CoV-2/FLU/RSV  testing. Fact Sheet for Patients: PinkCheek.be Fact Sheet for Healthcare Providers: GravelBags.it This test is not yet approved or cleared by the [place] FDA and  has been authorized for detection and/or diagnosis of SARS-CoV-2 by  FDA under an Emergency Use Authorization (EUA). This EUA will remain  in effect (meaning this test can be used) for the duration of the  Covid-19 declaration under Section 564(b)(1) of the Act, 21  U.S.C. section 360bbb-3(b)(1), unless the authorization is  terminated or revoked. Performed at [facility], [place] [address] [place], [place] [address]          Radiology Studies: No results found.      Scheduled Meds: . amLODipine  5 mg Oral Daily  . aspirin EC  81 mg Oral Daily  . docusate sodium  100 mg Oral BID  . lamoTRIgine  150 mg Oral BID  . [START ON 05/08/2019] levETIRAcetam  250 mg Oral Daily   And  . levETIRAcetam  500 mg Oral QHS  .  memantine  10 mg Oral BID  . polyethylene glycol  17 g Oral Daily  . pramipexole  0.25 mg Oral Daily  . senna-docusate  1 tablet Oral BID  . simvastatin  10 mg Oral Daily  . tiotropium  18 mcg Inhalation Daily   Continuous Infusions: . sodium chloride 75 mL/hr (05/04/19 1225)  . sodium chloride 500 mL (05/07/19 1001)  . dextrose 5 % and 0.9% NaCl 50 mL/hr at 05/05/19 0608  . lactated ringers 75 mL/hr at 05/07/19 1007  . sodium phosphate  Dextrose 5% IVPB 43 mL/hr at 05/07/19 1003     LOS: 3 days    Time spent: [place], MD [facility] Pager [phone]   If 7PM-7AM, please contact night-coverage [email or web address] Password [facility] 05/07/2019, 11:31 AM

## 2019-05-07 NOTE — NC FL2 (Addendum)
[place] LEVEL OF CARE SCREENING TOOL     IDENTIFICATION  Patient Name: [name] Birthdate: 01/20/1933 Sex: female Admission Date (Current Location): 05/03/2019  [facility] and [place] Number:  Herbalist and Address:  [facility],  [place], [place]      Provider Number: [id number]  Attending Physician Name and Address:  Elie Confer, MD  Relative Name and Phone Number:    Gita Kudo [phone]      Current Level of Care: Hospital Recommended Level of Care: [place] Prior Approval Number:    Date Approved/Denied:   PASRR Number:   [id number]   Discharge Plan: SNF    Current Diagnoses: Patient Active Problem List   Diagnosis Date Noted  . Humerus fracture 05/04/2019  . Right thyroid nodule 02/28/2016  . Abnormal thyroid uptake 11/10/2015  . Coronary artery calcification 12/05/2014  . Panlobular emphysema ([place]) 07/25/2014  . Dyspnea and respiratory abnormality 07/25/2014  . Multiple lung nodules on CT 07/25/2014  . Smoking history 07/25/2014  . Pain of right lower leg 05/23/2014  . Fall 05/23/2014  . Gait disorder 04/25/2014  . Memory disorder 04/25/2014  . Essential tremor 04/25/2014  . Advanced directives, counseling/discussion 02/18/2013  . Seizure disorder ([place]) 10/15/2012  . Cancer of skin, squamous cell 10/15/2012  . Unspecified vitamin D deficiency 10/15/2012  . Other and unspecified hyperlipidemia 10/15/2012  . Edema 09/10/2012  . Restless legs syndrome (RLS) 09/12/2011  . Insomnia, unspecified 09/12/2011  . COPD (chronic obstructive pulmonary disease) ([place]) 05/23/2011  . Essential hypertension 01/31/2011  . Senile osteoporosis 01/31/2011  . Major depressive disorder, single episode 01/31/2011    Orientation RESPIRATION BLADDER Height & Weight     Self, Time, Situation, Place  Normal Continent Weight: 178 lb 9.2 oz (81 kg) Height:  5' 5.5" (166.4 cm)   BEHAVIORAL SYMPTOMS/MOOD NEUROLOGICAL BOWEL NUTRITION STATUS      Continent Diet  AMBULATORY STATUS COMMUNICATION OF NEEDS Skin   Extensive Assist Verbally Surgical wounds([place]cs[place] on Left Shoulder)                       Personal Care Assistance Level of Assistance  Bathing, Dressing, Feeding Bathing Assistance: Maximum assistance Feeding assistance: Independent Dressing Assistance: Maximum assistance     Functional Limitations Info  Sight, Hearing, Speech Sight Info: Impaired Hearing Info: Adequate Speech Info: Adequate    SPECIAL CARE FACTORS FREQUENCY  PT (By licensed PT), OT (By licensed OT)     PT Frequency: 5x/week OT Frequency: 5x/week            Contractures Contractures Info: Not present    Additional Factors Info  Code Status, Allergies, Psychotropic Code Status Info: DNR Allergies Info: Allergies: Latex, Sulfa Antibiotics, Tetracyclines & Related           Current Medications (05/07/2019):  This is the current hospital active medication list Current Facility-Administered Medications  Medication Dose Route Frequency Provider Last Rate Last Admin  . 0.9 %  sodium chloride infusion   Intravenous Continuous Shuford, Tracy, PA-C 75 mL/hr at 05/04/19 1225 75 mL/hr at 05/04/19 1225  . 0.9 %  sodium chloride infusion   Intravenous PRN Wynonia Musty, RN 10 mL/hr at 05/07/19 1001 500 mL at 05/07/19 1001  . amLODipine (NORVASC) tablet 5 mg  5 mg Oral Daily Shuford, Tracy, PA-C   5 mg at 05/07/19 [id number]  . aspirin EC tablet 81 mg  81 mg Oral  Daily Shuford, Olivia Mackie, PA-C   81 mg at 05/07/19 [id number]  . bisacodyl (DULCOLAX) EC tablet 5 mg  5 mg Oral Daily PRN Shuford, Tracy, PA-C      . dextrose 5 %-0.9 % sodium chloride infusion   Intravenous Continuous Shuford, Tracy, PA-C 50 mL/hr at 05/05/19 0608 New Bag at 05/05/19 [id number]  . diphenhydrAMINE (BENADRYL) 12.5 MG/5ML elixir 12.5-25 mg  12.5-25 mg Oral Q4H PRN Shuford, Tracy, PA-C      . docusate sodium (COLACE)  capsule 100 mg  100 mg Oral BID Shuford, Tracy, PA-C   100 mg at 05/07/19 [id number]  . fentaNYL (SUBLIMAZE) injection 50 mcg  50 mcg Intravenous Q2H PRN Shuford, Tracy, PA-C   50 mcg at 05/06/19 1606  . HYDROmorphone (DILAUDID) injection 0.5 mg  0.5 mg Intravenous Q4H PRN Shuford, Tracy, PA-C   0.5 mg at 05/06/19 1025  . lactated ringers infusion   Intravenous Continuous Shuford, Olivia Mackie, PA-C 75 mL/hr at 05/07/19 1007 New Bag at 05/07/19 1007  . lamoTRIgine (LAMICTAL) tablet 150 mg  150 mg Oral BID Shuford, Tracy, PA-C   150 mg at 05/07/19 [id number]  . [START ON 05/08/2019] levETIRAcetam (KEPPRA) tablet 250 mg  250 mg Oral Daily Leodis Sias T, RPH       And  . levETIRAcetam (KEPPRA) tablet 500 mg  500 mg Oral QHS Bell, Michelle T, RPH      . magnesium citrate solution 1 Bottle  1 Bottle Oral Once PRN Shuford, Olivia Mackie, PA-C      . memantine (NAMENDA) tablet 10 mg  10 mg Oral BID Shuford, Tracy, PA-C   10 mg at 05/07/19 [id number]  . menthol-cetylpyridinium (CEPACOL) lozenge 3 mg  1 lozenge Oral PRN Shuford, Olivia Mackie, PA-C       Or  . phenol (CHLORASEPTIC) mouth spray 1 spray  1 spray Mouth/Throat PRN Shuford, Olivia Mackie, PA-C      . metoCLOPramide (REGLAN) tablet 5-10 mg  5-10 mg Oral Q8H PRN Shuford, Tracy, PA-C       Or  . metoCLOPramide (REGLAN) injection 5-10 mg  5-10 mg Intravenous Q8H PRN Shuford, Tracy, PA-C      . ondansetron (ZOFRAN) tablet 4 mg  4 mg Oral Q6H PRN Shuford, Tracy, PA-C       Or  . ondansetron (ZOFRAN) injection 4 mg  4 mg Intravenous Q6H PRN Shuford, Tracy, PA-C   4 mg at 05/06/19 1738  . oxyCODONE-acetaminophen (PERCOCET/ROXICET) 5-325 MG per tablet 1 tablet  1 tablet Oral Q6H PRN Shuford, Tracy, PA-C   1 tablet at 05/07/19 [phone]  . polyethylene glycol (MIRALAX / GLYCOLAX) packet 17 g  17 g Oral Daily Shuford, Tracy, PA-C   17 g at 05/07/19 0955  . polyethylene glycol (MIRALAX / GLYCOLAX) packet 17 g  17 g Oral Daily PRN Shuford, Olivia Mackie, PA-C      . pramipexole (MIRAPEX) tablet 0.25 mg  0.25 mg  Oral Daily Shuford, Tracy, PA-C   0.25 mg at 05/06/19 2123  . senna-docusate (Senokot-S) tablet 1 tablet  1 tablet Oral BID Shuford, Tracy, PA-C   1 tablet at 05/07/19 [id number]  . simvastatin (ZOCOR) tablet 10 mg  10 mg Oral Daily Shuford, Tracy, PA-C   10 mg at 05/07/19 0955  . sodium phosphate 20 mmol in dextrose 5 % 250 mL infusion  20 mmol Intravenous Once Elie Confer, MD 43 mL/hr at 05/07/19 1003 Restarted at 05/07/19 1003  . tiotropium (SPIRIVA) inhalation capsule ([facility] use ONLY) 18 mcg  18 mcg Inhalation  Daily Shuford, Olivia Mackie, [place]   18 mcg at 05/07/19 [id number]     Discharge Medications: Please see discharge summary for a list of discharge medications.  Relevant Imaging Results:  Relevant Lab Results:   Additional Information SSN: [id number]  Lia Hopping, LCSW

## 2019-05-07 NOTE — TOC Progression Note (Addendum)
Transition of Care [facility]) - Progression Note    Patient Details  Name: APRILLE SAWHNEY MRN: [id number] Date of Birth: [birth year]  Transition of Care [facility]) CM/SW [place], [place] Phone Number: 05/07/2019, 11:58 AM  Clinical Narrative:    CSW reached out to [facility] resident coordinator 2x,this am, waiting for a return call.  CSW met with the patient and her sister at beside to discuss d/c plan for tomorrow, if medically stable. Patient prefers to transport by [facility].  PASSRR and FL2 complete Covid-19 test pending.  CSW intiated [place] reference number [id number]. Clinical documents faxed for review.   [employer] Auth. Authorization:[id number] Approve 5 days Start Day Jan. 30, next review day Feb. 3, 2021 Fax:[phone] CSW reached out SNF to provide details, no answer.   Tomorrow the patient will discharge [id number] Room 35, Building B.  Nurse call report to: [phone] "Ask for [facility] nurse"    Expected Discharge Plan: Skilled Nursing Facility Barriers to Discharge: Other (comment)(covid-19 test)  Expected Discharge Plan and Services Expected Discharge Plan: [place] In-house Referral: Clinical Social Work Discharge Planning Services: CM Consult   Living arrangements for the past 2 months: [place]                                       Social Determinants of Health (SDOH) Interventions    Readmission Risk Interventions No flowsheet data found.

## 2019-05-07 NOTE — Care Management Important Message (Signed)
Important Message  Patient Details IM Letter given to Kathrin Greathouse SW Case Manager to present to the Patient Name: NORETTA HOLZ MRN: [id number] Date of Birth: November 10, 1932   Medicare Important Message Given:  Yes     Kerin Salen 05/07/2019, 12:57 PM

## 2019-05-07 NOTE — Progress Notes (Addendum)
Bladder scan volume of 458 ml. Leonides Grills, MD aware.  Verbal orders given for straight cath.  Pt straight catheterized with am output of 800 ml. Pt DTV.

## 2019-05-07 NOTE — Evaluation (Signed)
Occupational Therapy Evaluation Patient Details Name: [name] MRN: [id number] DOB: 01-07-1933 Today's Date: 05/07/2019    History of Present Illness 86 y o female s/p fall resulting in L displaced 4 part humerus fx and mandibular condyle fx.  S/o L RTSA   Clinical Impression   Pt was above.  At baseline, she is independent/mod I with adls.  Pt needs mod to max A for SPT and adls at this time.  Reviewed shoulder precautions, and she will need reinforcement. Pt lives at [facility] independent living and plans to go to their SNF. Will follow in acute setting with min A level goals.    Follow Up Recommendations  SNF    Equipment Recommendations  3 in 1 bedside commode    Recommendations for Other Services       Precautions / Restrictions Precautions Precautions: Fall;Shoulder Type of Shoulder Precautions: may use LUE passively during adls within the following parameters:  10 ER, 45 ABD, 60 FF.  May move elbow wrist and fingers.  May dangle during shower.  NO PENDULUMS Shoulder Interventions: Shoulder sling/immobilizer Precaution Booklet Issued: Yes (comment) Restrictions Weight Bearing Restrictions: Yes Other Position/Activity Restrictions: LUE NWB      Mobility Bed Mobility               General bed mobility comments: mod A to come to EOB with HOB raised  Transfers Overall transfer level: Needs assistance Equipment used: 1 person hand held assist Transfers: Sit to/from [facility] Sit to Stand: Mod assist Stand pivot transfers: Mod assist       General transfer comment: mod A to power up and stabilize.  Mod A for steps to chair--unsteady    Balance Overall balance assessment: History of Falls;Needs assistance           Standing balance-Leahy Scale: Poor                             ADL either performed or assessed with clinical judgement   ADL Overall ADL's : Needs assistance/impaired Eating/Feeding: Set up    Grooming: Set up   Upper Body Bathing: Moderate assistance   Lower Body Bathing: Maximal assistance   Upper Body Dressing : Maximal assistance   Lower Body Dressing: Maximal assistance   Toilet Transfer: Moderate assistance;Stand-pivot(to chair)   Toileting- Clothing Manipulation and Hygiene: Moderate assistance;Sitting/lateral lean         General ADL Comments: educated on shoulder protocol and got up to chair as breakfast arrived.  Educated on Passive use of LUE during adls and reviewed protocol.  Performed allowed distal exercises.      Vision         Perception     Praxis      Pertinent Vitals/Pain Pain Assessment: 0-10 Pain Score: 5  Pain Location: chest Pain Descriptors / Indicators: Aching Pain Intervention(s): Limited activity within patient's tolerance     Hand Dominance Right   Extremity/Trunk Assessment Upper Extremity Assessment Upper Extremity Assessment: LUE deficits/detail(able to move fingers wrist and elbow)           Communication Communication Communication: No difficulties   Cognition Arousal/Alertness: Awake/alert Behavior During Therapy: WFL for tasks assessed/performed                                   General Comments: per chart, mild memory difficulties   General  Comments       Exercises     Shoulder Instructions      Home Living Family/patient expects to be discharged to:: Skilled nursing facility                                 Additional Comments: From [facility]      Prior Functioning/Environment Level of Independence: Independent;Independent with assistive device(s)        Comments: rollator in the community; none in her apt.  Sleeps in a lift chair        OT Problem List: Decreased strength;Decreased activity tolerance;Impaired balance (sitting and/or standing);Decreased knowledge of use of DME or AE;Decreased knowledge of precautions;Pain      OT  Treatment/Interventions: Self-care/ADL training;DME and/or AE instruction;Balance training;Patient/family education;Therapeutic activities;Therapeutic exercise    OT Goals(Current goals can be found in the care plan section) Acute Rehab OT Goals Patient Stated Goal: return to independence OT Goal Formulation: With patient Time For Goal Achievement: 05/21/19 Potential to Achieve Goals: Good ADL Goals Pt Will Transfer to Toilet: with min guard assist;bedside commode;stand pivot transfer(and complete hygiene at this level) Additional ADL Goal #1: Pt will perform ADL with min A using AE as needed and following shoulder precautions Additional ADL Goal #2: Pt will be independent with written HEP for distal ROM and follow shoulder precautions without cues  OT Frequency: Min 2X/week   Barriers to D/C:            Co-evaluation              AM-PAC OT "6 Clicks" Daily Activity     Outcome Measure Help from another person eating meals?: A Little Help from another person taking care of personal grooming?: A Little Help from another person toileting, which includes using toliet, bedpan, or urinal?: A Lot Help from another person bathing (including washing, rinsing, drying)?: A Lot Help from another person to put on and taking off regular upper body clothing?: A Lot Help from another person to put on and taking off regular lower body clothing?: A Lot 6 Click Score: 14   End of Session Nurse Communication: (pt up in chair.  alarm pad wasn't in chair)  Activity Tolerance: Patient tolerated treatment well Patient left: in chair;with call bell/phone within reach  OT Visit Diagnosis: Unsteadiness on feet (R26.81);History of falling (Z91.81);Muscle weakness (generalized) (M62.81)                Time: [id number] OT Time Calculation (min): 36 min Charges:  OT General Charges $OT Visit: 1 Visit OT Evaluation $OT Eval Low Complexity: 1 Low OT Treatments $Self Care/Home Management : 8-22  mins  Jack Bolio S, OTR/L Acute Rehabilitation Services 05/07/2019  Jaysean Manville 05/07/2019, 9:32 AM

## 2019-05-07 NOTE — Progress Notes (Addendum)
SpO2 dropped to 83% on RA. SpO2 91% on 2L [place]. Will CTM. Leonides Grills Notified.   Pt DTV. Bladder scan volume of 239 at 1100. MD made aware.  See new orders for UA sample.

## 2019-05-07 NOTE — Progress Notes (Signed)
CHLO… BLEAK  MRN: [id number] DOB/Age: [birth year] 86 y.o. [facility] Procedure: Procedure(s) (LRB): REVERSE SHOULDER ARTHROPLASTY (Left)     Subjective: Awake and alert this am, no c/o of pain. Does feel like its hard to take a deep breath and some rib pain but has had years of SOB related to COPD according to patient  Vital Signs Temp:  [97.3 F (36.3 C)-99.9 F (37.7 C)] 97.7 F (36.5 C) (01/29 0538) Pulse Rate:  [76-93] 84 (01/29 0538) Resp:  [14-21] 16 (01/29 0538) BP: (103-137)/(64-87) 127/80 (01/29 0538) SpO2:  [87 %-100 %] 93 % (01/29 0538) Weight:  [81 kg] 81 kg (01/29 0500)  Lab Results Recent Labs    05/05/19 0330 05/06/19 0342  WBC 9.8 8.6  HGB 13.7 11.8*  HCT 42.0 37.1  PLT 194 178   BMET Recent Labs    05/05/19 0330 05/06/19 0342  NA 140 142  K 3.7 3.3*  CL 105 109  CO2 25 25  GLUCOSE 121* 112*  BUN 21 25*  CREATININE 0.58 0.58  CALCIUM 8.7* 8.8*   INR  Date Value Ref Range Status  11/27/2015 0.96  Final     Exam In no respiratory distress, working on BSIS  Left shoulder dressing dry NVI distally at the hand        Plan Continue medical management per hospitalist We discussed that the block can sometimes make you feel more SOB due to its effects on the nerves of diaphragm We will have OT to see today for edema control From our standpoint when medically stable [facility] and follow up with [place] in 2 weeks in the office Instructions placed in the chart Rx from tramadol provided, or tylenol for pain  Jenetta Loges PA-C  05/07/2019, 7:54 AM Contact # [phone]

## 2019-05-07 NOTE — Evaluation (Signed)
Physical Therapy Evaluation Patient Details Name: [name] MRN: [id number] DOB: 15-Apr-1932 Today's Date: 05/07/2019   History of Present Illness  86 y o female s/p fall resulting in L displaced 4 part humerus fx and mandibular condyle fx.  S/o L RTSA  Clinical Impression  Pt admitted as above and presenting with functional mobility limitations 2* generalized weakness, balance deficits, non-use of L UE, and post op pain.  Pt is currently requiring mod assist for safe performance of all mobility tasks and would benefit from follow up rehab at [facility] to maximize IND and safety prior to return to [place] apartment.    Follow Up Recommendations SNF    Equipment Recommendations  None recommended by PT    Recommendations for Other Services       Precautions / Restrictions Precautions Precautions: Fall;Shoulder Type of Shoulder Precautions: may use LUE passively during adls within the following parameters:  10 ER, 45 ABD, 60 FF.  May move elbow wrist and fingers.  May dangle during shower.  NO PENDULUMS Shoulder Interventions: Shoulder sling/immobilizer Precaution Booklet Issued: Yes (comment) Restrictions Weight Bearing Restrictions: Yes Other Position/Activity Restrictions: LUE NWB      Mobility  Bed Mobility               General bed mobility comments: Pt up in chair with OT and requests back to same  Transfers Overall transfer level: Needs assistance Equipment used: 1 person hand held assist Transfers: Sit to/from Stand Sit to Stand: Mod assist         General transfer comment: mod A to power up and stabilize.  Ambulation/Gait Ambulation/Gait assistance: Min assist;Mod assist Gait Distance (Feet): 13 Feet Assistive device: Rolling walker (2 wheeled) Gait Pattern/deviations: Step-to pattern;Decreased step length - right;Decreased step length - left;Shuffle;Trunk flexed Gait velocity: decr   General Gait Details: Pt with one hand on RW and PT  stabilizing other side.  Distance ltd by pt c/o chest pain  Stairs            Wheelchair Mobility    Modified Rankin (Stroke Patients Only)       Balance Overall balance assessment: History of Falls;Needs assistance Sitting-balance support: Single extremity supported;Feet supported Sitting balance-Leahy Scale: Good       Standing balance-Leahy Scale: Poor                               Pertinent Vitals/Pain Pain Assessment: 0-10 Pain Score: 6  Pain Location: chest Pain Descriptors / Indicators: Aching Pain Intervention(s): Limited activity within patient's tolerance;Monitored during session;Premedicated before session    Home Living Family/patient expects to be discharged to:: Skilled nursing facility                 Additional Comments: From [facility]    Prior Function Level of Independence: Independent;Independent with assistive device(s)         Comments: rollator in the community; none in her apt.  Sleeps in a lift chair     Hand Dominance   Dominant Hand: Right    Extremity/Trunk Assessment   Upper Extremity Assessment Upper Extremity Assessment: Defer to OT evaluation    Lower Extremity Assessment Lower Extremity Assessment: Generalized weakness    Cervical / Trunk Assessment Cervical / Trunk Assessment: Kyphotic  Communication   Communication: No difficulties  Cognition Arousal/Alertness: Awake/alert Behavior During Therapy: WFL for tasks assessed/performed  General Comments: per chart, mild memory difficulties      General Comments      Exercises General Exercises - Lower Extremity Ankle Circles/Pumps: AROM;Both;20 reps;Supine   Assessment/Plan    PT Assessment Patient needs continued PT services  PT Problem List Decreased strength;Decreased activity tolerance;Decreased balance;Decreased mobility;Decreased knowledge of use of DME;Pain        PT Treatment Interventions DME instruction;Gait training;Functional mobility training;Therapeutic activities;Therapeutic exercise;Patient/family education;Balance training    PT Goals (Current goals can be found in the Care Plan section)  Acute Rehab PT Goals Patient Stated Goal: return to independence PT Goal Formulation: With patient Time For Goal Achievement: 05/14/19 Potential to Achieve Goals: Fair    Frequency Min 3X/week   Barriers to discharge        Co-evaluation               AM-PAC PT "6 Clicks" Mobility  Outcome Measure Help needed turning from your back to your side while in a flat bed without using bedrails?: A Lot Help needed moving from lying on your back to sitting on the side of a flat bed without using bedrails?: A Lot Help needed moving to and from a bed to a chair (including a wheelchair)?: A Lot Help needed standing up from a chair using your arms (e.g., wheelchair or bedside chair)?: A Lot Help needed to walk in hospital room?: A Lot Help needed climbing 3-5 steps with a railing? : Total 6 Click Score: 11    End of Session Equipment Utilized During Treatment: Gait belt;Other (comment)(arm sling) Activity Tolerance: Patient limited by pain Patient left: in chair;with call bell/phone within reach;with nursing/sitter in room Nurse Communication: Mobility status PT Visit Diagnosis: Unsteadiness on feet (R26.81);Muscle weakness (generalized) (M62.81);Difficulty in walking, not elsewhere classified (R26.2);History of falling (Z91.81)    Time: [id number] PT Time Calculation (min) (ACUTE ONLY): 25 min   Charges:   PT Evaluation $PT Eval Low Complexity: 1 Low          Debe Coder PT Acute Rehabilitation Services Pager [phone] Office [phone]   Citlali Gautney 05/07/2019, 3:31 PM

## 2019-05-08 LAB — COMPREHENSIVE METABOLIC PANEL
ALT: 23 U/L (ref 0–44)
AST: 29 U/L (ref 15–41)
Albumin: 3.1 g/dL — ABNORMAL LOW (ref 3.5–5.0)
Alkaline Phosphatase: 106 U/L (ref 38–126)
Anion gap: 9 (ref 5–15)
BUN: 17 mg/dL (ref 8–23)
CO2: 26 mmol/L (ref 22–32)
Calcium: 8.3 mg/dL — ABNORMAL LOW (ref 8.9–10.3)
Chloride: 103 mmol/L (ref 98–111)
Creatinine, Ser: 0.48 mg/dL (ref 0.44–1.00)
GFR calc Af Amer: >60 mL/min (ref >60)
GFR calc non Af Amer: >60 mL/min (ref >60)
Glucose, Bld: 106 mg/dL — ABNORMAL HIGH (ref 70–99)
Potassium: 3.6 mmol/L (ref 3.5–5.1)
Sodium: 138 mmol/L (ref 135–145)
Total Bilirubin: 1.5 mg/dL — ABNORMAL HIGH (ref 0.3–1.2)
Total Protein: 5.7 g/dL — ABNORMAL LOW (ref 6.5–8.1)

## 2019-05-08 LAB — CBC
HCT: 36 % (ref 36.0–46.0)
Hemoglobin: 11.4 g/dL — ABNORMAL LOW (ref 12.0–15.0)
MCH: 31.1 pg (ref 26.0–34.0)
MCHC: 31.7 g/dL (ref 30.0–36.0)
MCV: 98.1 fL (ref 80.0–100.0)
Platelets: 167 10*3/uL (ref 150–400)
RBC: 3.67 MIL/uL — ABNORMAL LOW (ref 3.87–5.11)
RDW: 13.2 % (ref 11.5–15.5)
WBC: 9.8 10*3/uL (ref 4.0–10.5)
nRBC: 0 % (ref 0.0–0.2)

## 2019-05-08 LAB — PHOSPHORUS: Phosphorus: 2.1 mg/dL — ABNORMAL LOW (ref 2.5–4.6)

## 2019-05-08 LAB — MAGNESIUM: Magnesium: 1.8 mg/dL (ref 1.7–2.4)

## 2019-05-08 MED ORDER — BUDESONIDE 0.25 MG/2ML IN SUSP
0.2500 mg | Freq: Two times a day (BID) | RESPIRATORY_TRACT | Status: DC
  Administered 2019-05-09 – 2019-05-13 (×9): 0.25 mg via RESPIRATORY_TRACT
  Filled 2019-05-08 (×9): qty 2

## 2019-05-08 MED ORDER — TAMSULOSIN HCL 0.4 MG PO CAPS
0.8000 mg | ORAL_CAPSULE | Freq: Once | ORAL | Status: AC
  Administered 2019-05-08: 0.8 mg via ORAL
  Filled 2019-05-08: qty 2

## 2019-05-08 MED ORDER — ENSURE ENLIVE PO LIQD
237.0000 mL | Freq: Three times a day (TID) | ORAL | Status: DC
  Administered 2019-05-08 – 2019-05-13 (×14): 237 mL via ORAL

## 2019-05-08 MED ORDER — FUROSEMIDE 10 MG/ML IJ SOLN
20.0000 mg | Freq: Once | INTRAMUSCULAR | Status: AC
  Administered 2019-05-08: 20 mg via INTRAVENOUS
  Filled 2019-05-08: qty 2

## 2019-05-08 MED ORDER — SODIUM PHOSPHATES 45 MMOLE/15ML IV SOLN
20.0000 mmol | Freq: Once | INTRAVENOUS | Status: AC
  Administered 2019-05-08: 20 mmol via INTRAVENOUS
  Filled 2019-05-08: qty 6.67

## 2019-05-08 MED ORDER — TAMSULOSIN HCL 0.4 MG PO CAPS
0.4000 mg | ORAL_CAPSULE | Freq: Every day | ORAL | Status: DC
  Administered 2019-05-09 – 2019-05-13 (×5): 0.4 mg via ORAL
  Filled 2019-05-08 (×6): qty 1

## 2019-05-08 MED ORDER — CHLORHEXIDINE GLUCONATE CLOTH 2 % EX PADS
6.0000 | MEDICATED_PAD | Freq: Every day | CUTANEOUS | Status: DC
  Administered 2019-05-08 – 2019-05-13 (×5): 6 via TOPICAL

## 2019-05-08 MED ORDER — IPRATROPIUM-ALBUTEROL 0.5-2.5 (3) MG/3ML IN SOLN
3.0000 mL | RESPIRATORY_TRACT | Status: DC | PRN
  Administered 2019-05-08: 3 mL via RESPIRATORY_TRACT
  Filled 2019-05-08: qty 3

## 2019-05-08 NOTE — Progress Notes (Signed)
PROGRESS NOTE    [name]  [name] DOB: 1933-01-12 DOA: 05/03/2019 PCP: Leighton Ruff, MD    Brief Narrative:  [name] is a 86 y.o. female with medical history significant of mild memory disorder, COPD lives in assisted living comes in after mechanical fall injuring her left upper arm.  Patient denies any recent illnesses.  She denies any loss of consciousness.  She reports left arm pain.  She suffered a lack to her chin and brow which has been repaired emergency department.  Patient found to have a left humerus fracture and a mandibular fracture both orthopedic surgery and ENT have been called who will see patient in the morning.  Her humerus will likely need surgery.  Patient be referred for admission for orthopedic injuries in a patient with multiple other medical problems.  Her pain is much better after receiving fentanyl in the emergency department.  She lives alone at her assisted living. Alert and oriented with mild cognitive impairment. Orthopedics planning for right shoulder arthroplasty tomorrow 05/06/2019. Ortho plan for reverse right shoulder arthroplasty today 1/28/2021at 4 PM.  Reported decreased urine output/retention.  Appears to have resolved.  No new complaints today.  Potassium and phosphorus low.  We will replete with potassium chloride and sodium potassium phosphate p.o. 05/07/2019: Patient is status post revised left shoulder arthroplasty by Ortho.  Sitting out in chair and eating breakfast.  Not in acute distress. Getting pain management. Seen by Ortho today.  Plan is for OT evaluation. Ortho is okay for discharge when medically stable.  We will plan to discharge home tomorrow.     Assessment & Plan:   Principal Problem:   Humerus fracture Active Problems:   COPD (chronic obstructive pulmonary disease) ([place])   Essential hypertension   Fall  Clinical problems list 1.  Mechanical fall with left proximal humeral neck fracture with displacement  and comminution 2.  Right mandible condyle fracture 3.  Chronic obstructive pulmonary disease 4.  Essential hypertension  5.  Seizure disorder 6. Hypophosphatemia 7.  Acute urinary retention   1.  Mechanical fall with left proximal humeral neck fracture with displacement and comminution. Ortho has seen and evaluated and plan was for reverse shoulder arthroplasty. Continue with shoulder sling Continue with pain management  2.  Right mandibular condyle fracture.  Patient was evaluated by oral maxillofacial/plastic surgery and recommended liquid diet and no surgical intervention for mandibular fracture. Maxillofacial surgery has no plans for surgical intervention.  3.  Chronic obstructive pulmonary disease.  Not in acute exacerbation and no home oxygen use.  Continue to monitor Continue with bronchodilators as needed  4.  Essential hypertension.  Patient is currently normotensive  Continue to monitor  5.  Seizure disorder.  Stable Start p.o. Keppra in preparation for discharge home tomorrow.    6. Hypophosphatemia Will replete with phosphate IV Continue to monitor.  7.  Acute urinary retention.  Patient with a history of bladder incontinence now presenting with acute urinary retention.  Etiology uncertain possibly opiate induced-patient receiving increasing opiates for pain due to shoulder left humeral fracture status post right shoulder arthroplasty. Will initiate Flomax and decrease opiates Maintain Foley catheter for 24 hours and remove for voiding trial.  DVT prophylaxis: Lovenox subcute  Code Status: DNR Family Communication: Patient's sister is at bedside and spoke extensively with.  I answered her questions to her satisfaction. Disposition plan: Patient is from assisted living facility I will be discharged to assisted facility. Barriers to discharge include acute urinary  retention requiring placement of Foley catheter and respiratory distress with hypoxia requiring 2 L O2 by  nasal cannula.  Will reassess patient's urinary symptoms and oxygen requirements tomorrow for possible discharge on 05/09/2019.    Consultants:   Orthopedics  Plastic surgery  Procedures: None   Antimicrobials: None    Subjective: Patient was seen and examined at bedside.  Laying in bed not in acute distress.  Her sister is at bedside.  Patient tolerating liquid diet. Had acute urinary retention requiring insertion of Foley catheter. Initiated Flomax with decrease opiate dose.  We will do a voiding trial tomorrow and if residuals less, will discontinue Foley and plan for discharge home. Initiate bronchodilators and try to wean off oxygen.  Objective: Vitals:   05/07/19 2056 05/08/19 0523 05/08/19 0813 05/08/19 1313  BP: 139/80 126/76  116/64  Pulse: 92 88 87 85  Resp: 16 20 20    Temp: 98.1 F (36.7 C) (!) 97.5 F (36.4 C)  98 F (36.7 C)  TempSrc: Oral Oral  Oral  SpO2: 95% 91% 91% 98%  Weight:      Height:        Intake/Output Summary (Last 24 hours) at 05/08/2019 1432 Last data filed at 05/08/2019 1321 Gross per 24 hour  Intake 2690.91 ml  Output 1350 ml  Net 1340.91 ml   Filed Weights   05/03/19 2103 05/07/19 0500  Weight: 74.8 kg 81 kg    Examination:  General exam: Appears calm and comfortable.  Not in acute distress Respiratory system: Clear to auscultation. Respiratory effort normal. Cardiovascular system: S1 & S2 heard, RRR. No JVD, murmurs, rubs, gallops or clicks. No pedal edema. Gastrointestinal system: Abdomen is nondistended, soft and nontender. No organomegaly or masses felt. Normal bowel sounds heard. Central nervous system: Alert and oriented. No focal neurological deficits. Extremities: On left shoulder sling . Skin: No rashes, lesions or ulcers Psychiatry: Judgement and insight appear normal. Mood & affect appropriate.     Data Reviewed: I have personally reviewed following labs and imaging studies  CBC: Recent Labs  Lab 05/04/19 0025  05/04/19 0449 05/05/19 0330 05/06/19 0342 05/08/19 0327  WBC 13.3* 10.5 9.8 8.6 9.8  NEUTROABS 11.3*  --   --   --   --   HGB 13.6 13.8 13.7 11.8* 11.4*  HCT 41.6 42.6 42.0 37.1 36.0  MCV 93.7 94.2 95.0 95.6 98.1  PLT 198 203 194 178 [id number]   Basic Metabolic Panel: Recent Labs  Lab 05/04/19 0025 05/04/19 0449 05/05/19 0330 05/06/19 0342 05/08/19 0327  NA 139 139 140 142 138  K 3.5 3.5 3.7 3.3* 3.6  CL 104 104 105 109 103  CO2 24 24 25 25 26   GLUCOSE 132* 138* 121* 112* 106*  BUN 31* 27* 21 25* 17  CREATININE 0.96 0.76 0.58 0.58 0.48  CALCIUM 9.0 8.9 8.7* 8.8* 8.3*  MG  --   --  2.2 2.2 1.8  PHOS  --   --  2.6 1.6* 2.1*   GFR: Estimated Creatinine Clearance: 55.6 mL/min (by C-G formula based on SCr of 0.48 mg/dL). Liver Function Tests: Recent Labs  Lab 05/04/19 0025 05/05/19 0330 05/06/19 0342 05/08/19 0327  AST 23 20 16 29   ALT 17 15 14 23   ALKPHOS 76 78 68 106  BILITOT 1.1 1.2 1.0 1.5*  PROT 6.7 6.7 6.1* 5.7*  ALBUMIN 3.9 3.8 3.4* 3.1*   No results for input(s): LIPASE, AMYLASE in the last 168 hours. No results for input(s): AMMONIA in  the last 168 hours. Coagulation Profile: No results for input(s): INR, PROTIME in the last 168 hours. Cardiac Enzymes: No results for input(s): CKTOTAL, CKMB, CKMBINDEX, TROPONINI in the last 168 hours. BNP (last 3 results) No results for input(s): PROBNP in the last 8760 hours. HbA1C: No results for input(s): HGBA1C in the last 72 hours. CBG: No results for input(s): GLUCAP in the last 168 hours. Lipid Profile: No results for input(s): CHOL, HDL, LDLCALC, TRIG, CHOLHDL, LDLDIRECT in the last 72 hours. Thyroid Function Tests: No results for input(s): TSH, T4TOTAL, FREET4, T3FREE, THYROIDAB in the last 72 hours. Anemia Panel: No results for input(s): VITAMINB12, FOLATE, FERRITIN, TIBC, IRON, RETICCTPCT in the last 72 hours. Sepsis Labs: No results for input(s): PROCALCITON, LATICACIDVEN in the last 168 hours.  Recent  Results (from the past 240 hour(s))  Respiratory Panel by RT PCR (Flu A&B, Covid) - Nasopharyngeal Swab     Status: None   Collection Time: 05/03/19 11:30 PM   Specimen: Nasopharyngeal Swab  Result Value Ref Range Status   SARS Coronavirus 2 by RT PCR NEGATIVE NEGATIVE Final    Comment: (NOTE) SARS-CoV-2 target nucleic acids are NOT DETECTED. The SARS-CoV-2 RNA is generally detectable in upper respiratoy specimens during the acute phase of infection. The lowest concentration of SARS-CoV-2 viral copies this assay can detect is 131 copies/mL. A negative result does not preclude SARS-Cov-2 infection and should not be used as the sole basis for treatment or other patient management decisions. A negative result may occur with  improper specimen collection/handling, submission of specimen other than nasopharyngeal swab, presence of viral mutation(s) within the areas targeted by this assay, and inadequate number of viral copies (<131 copies/mL). A negative result must be combined with clinical observations, patient history, and epidemiological information. The expected result is Negative. Fact Sheet for Patients:  PinkCheek.be Fact Sheet for Healthcare Providers:  GravelBags.it This test is not yet ap proved or cleared by the [place] FDA and  has been authorized for detection and/or diagnosis of SARS-CoV-2 by FDA under an Emergency Use Authorization (EUA). This EUA will remain  in effect (meaning this test can be used) for the duration of the COVID-19 declaration under Section 564(b)(1) of the Act, 21 U.S.C. section 360bbb-3(b)(1), unless the authorization is terminated or revoked sooner.    Influenza A by PCR NEGATIVE NEGATIVE Final   Influenza B by PCR NEGATIVE NEGATIVE Final    Comment: (NOTE) The Xpert Xpress SARS-CoV-2/FLU/RSV assay is intended as an aid in  the diagnosis of influenza from Nasopharyngeal swab specimens  and  should not be used as a sole basis for treatment. Nasal washings and  aspirates are unacceptable for Xpert Xpress SARS-CoV-2/FLU/RSV  testing. Fact Sheet for Patients: PinkCheek.be Fact Sheet for Healthcare Providers: GravelBags.it This test is not yet approved or cleared by the [place] FDA and  has been authorized for detection and/or diagnosis of SARS-CoV-2 by  FDA under an Emergency Use Authorization (EUA). This EUA will remain  in effect (meaning this test can be used) for the duration of the  Covid-19 declaration under Section 564(b)(1) of the Act, 21  U.S.C. section 360bbb-3(b)(1), unless the authorization is  terminated or revoked. Performed at [facility], [place] [address], [place], [place] [address]   SARS CORONAVIRUS 2 (TAT 6-24 HRS) Nasopharyngeal Nasopharyngeal Swab     Status: None   Collection Time: 05/07/19  1:24 PM   Specimen: Nasopharyngeal Swab  Result Value Ref Range Status   SARS Coronavirus  2 NEGATIVE NEGATIVE Final    Comment: (NOTE) SARS-CoV-2 target nucleic acids are NOT DETECTED. The SARS-CoV-2 RNA is generally detectable in upper and lower respiratory specimens during the acute phase of infection. Negative results do not preclude SARS-CoV-2 infection, do not rule out co-infections with other pathogens, and should not be used as the sole basis for treatment or other patient management decisions. Negative results must be combined with clinical observations, patient history, and epidemiological information. The expected result is Negative. Fact Sheet for Patients: SugarRoll.be Fact Sheet for Healthcare Providers: https://www.woods-mathews.com/ This test is not yet approved or cleared by the [place] FDA and  has been authorized for detection and/or diagnosis of SARS-CoV-2 by FDA under an Emergency Use Authorization (EUA). This  EUA will remain  in effect (meaning this test can be used) for the duration of the COVID-19 declaration under Section 56 4(b)(1) of the Act, 21 U.S.C. section 360bbb-3(b)(1), unless the authorization is terminated or revoked sooner. Performed at [facility], [place] [address] [place], [place] [address]          Radiology Studies: No results found.      Scheduled Meds: . amLODipine  5 mg Oral Daily  . aspirin EC  81 mg Oral Daily  . budesonide (PULMICORT) nebulizer solution  0.25 mg Nebulization BID  . Chlorhexidine Gluconate Cloth  6 each Topical Daily  . docusate sodium  100 mg Oral BID  . feeding supplement (ENSURE ENLIVE)  237 mL Oral TID BM  . lamoTRIgine  150 mg Oral BID  . levETIRAcetam  250 mg Oral Daily   And  . levETIRAcetam  500 mg Oral QHS  . memantine  10 mg Oral BID  . polyethylene glycol  17 g Oral Daily  . pramipexole  0.25 mg Oral Daily  . senna-docusate  1 tablet Oral BID  . simvastatin  10 mg Oral Daily  . [START ON 05/09/2019] tamsulosin  0.4 mg Oral Daily  . tamsulosin  0.8 mg Oral Once  . tiotropium  18 mcg Inhalation Daily   Continuous Infusions: . sodium chloride Stopped (05/07/19 1747)  . dextrose 5 % and 0.9% NaCl 50 mL/hr at 05/05/19 0608  . lactated ringers Stopped (05/08/19 0940)  . sodium phosphate  Dextrose 5% IVPB 43 mL/hr at 05/08/19 1320  . sodium phosphate  Dextrose 5% IVPB       LOS: 4 days    Time spent: [place], MD [facility] Pager [phone]   If 7PM-7AM, please contact night-coverage [email or web address] Password [facility] 05/08/2019, 2:32 PM

## 2019-05-08 NOTE — Progress Notes (Signed)
Physical Therapy Treatment Patient Details Name: [name] MRN: [id number] DOB: [birth year] Today's Date: 05/08/2019    History of Present Illness 86 y o female s/p fall resulting in L displaced 4 part humerus fx and mandibular condyle fx.  S/o L RTSA    PT Comments    Pt requiring increased time for all tasks but motivated and with increased activity tolerance noted.   Follow Up Recommendations  SNF     Equipment Recommendations  None recommended by PT    Recommendations for Other Services       Precautions / Restrictions Precautions Precautions: Fall;Shoulder Type of Shoulder Precautions: may use LUE passively during adls within the following parameters:  10 ER, 45 ABD, 60 FF.  May move elbow wrist and fingers.  May dangle during shower.  NO PENDULUMS Shoulder Interventions: Shoulder sling/immobilizer Precaution Booklet Issued: Yes (comment) Restrictions Weight Bearing Restrictions: Yes Other Position/Activity Restrictions: LUE NWB    Mobility  Bed Mobility Overal bed mobility: Needs Assistance Bed Mobility: Supine to Sit;Sit to Supine     Supine to sit: Max assist;HOB elevated Sit to supine: Mod assist;HOB elevated   General bed mobility comments: cues for sequence  Transfers Overall transfer level: Needs assistance Equipment used: Rolling walker (2 wheeled) Transfers: Sit to/from Stand Sit to Stand: Mod assist;From elevated surface         General transfer comment: assist to power up and balance in intial standing  Ambulation/Gait Ambulation/Gait assistance: Min assist Gait Distance (Feet): 40 Feet Assistive device: Rolling walker (2 wheeled) Gait Pattern/deviations: Step-to pattern;Decreased step length - right;Decreased step length - left;Shuffle;Trunk flexed Gait velocity: decr   General Gait Details: Pt with one hand on RW and PT stabilizing other side.    Stairs             Wheelchair Mobility    Modified Rankin (Stroke Patients  Only)       Balance Overall balance assessment: History of Falls;Needs assistance Sitting-balance support: Feet supported Sitting balance-Leahy Scale: Good     Standing balance support: Single extremity supported Standing balance-Leahy Scale: Poor                              Cognition Arousal/Alertness: Awake/alert Behavior During Therapy: WFL for tasks assessed/performed                                   General Comments: per chart, mild memory difficulties      Exercises      General Comments        Pertinent Vitals/Pain Pain Assessment: 0-10 Pain Score: 4  Pain Location: chest Pain Descriptors / Indicators: Aching Pain Intervention(s): Limited activity within patient's tolerance;Monitored during session    Home Living                      Prior Function            PT Goals (current goals can now be found in the care plan section) Acute Rehab PT Goals Patient Stated Goal: return to independence PT Goal Formulation: With patient Time For Goal Achievement: 05/14/19 Potential to Achieve Goals: Fair Progress towards PT goals: Progressing toward goals    Frequency    Min 3X/week      PT Plan Current plan remains appropriate    Co-evaluation  AM-PAC PT "6 Clicks" Mobility   Outcome Measure  Help needed turning from your back to your side while in a flat bed without using bedrails?: A Lot Help needed moving from lying on your back to sitting on the side of a flat bed without using bedrails?: A Lot Help needed moving to and from a bed to a chair (including a wheelchair)?: A Lot Help needed standing up from a chair using your arms (e.g., wheelchair or bedside chair)?: A Lot Help needed to walk in hospital room?: A Lot Help needed climbing 3-5 steps with a railing? : Total 6 Click Score: 11    End of Session Equipment Utilized During Treatment: Gait belt Activity Tolerance: Patient limited by  fatigue;Patient limited by pain Patient left: in bed;with call bell/phone within reach;with nursing/sitter in room;with bed alarm set Nurse Communication: Mobility status PT Visit Diagnosis: Unsteadiness on feet (R26.81);Muscle weakness (generalized) (M62.81);Difficulty in walking, not elsewhere classified (R26.2);History of falling (Z91.81)     Time: 1453-1530 PT Time Calculation (min) (ACUTE ONLY): 37 min  Charges:  $Gait Training: 8-22 mins $Therapeutic Activity: 8-22 mins                     Debe Coder PT Acute Rehabilitation Services Pager [phone] Office [phone]    Praise [place]enn[place] 05/08/2019, 5:40 PM

## 2019-05-08 NOTE — Progress Notes (Signed)
Occupational Therapy Treatment Patient Details Name: [name] MRN: [id number] DOB: March 04, 1933 Today's Date: 05/08/2019    History of present illness 86 y o female s/p fall resulting in L displaced 4 part humerus fx and mandibular condyle fx.  S/o L RTSA   OT comments  Performed adl, reviewed shoulder education and performed hand/elbow ROM exercises.  Follow Up Recommendations  SNF    Equipment Recommendations  3 in 1 bedside commode    Recommendations for Other Services      Precautions / Restrictions Precautions Precautions: Fall;Shoulder Type of Shoulder Precautions: may use LUE passively during adls within the following parameters:  10 ER, 45 ABD, 60 FF.  May move elbow wrist and fingers.  May dangle during shower.  NO PENDULUMS Shoulder Interventions: Shoulder sling/immobilizer Restrictions Weight Bearing Restrictions: Yes Other Position/Activity Restrictions: LUE NWB       Mobility Bed Mobility Overal bed mobility: Needs Assistance Bed Mobility: Supine to Sit;Sit to Supine     Supine to sit: Max assist;HOB elevated Sit to supine: Mod assist;HOB elevated   General bed mobility comments: cues for sequence  Transfers   Equipment used: Rolling walker (2 wheeled)(using RUE only)   Sit to Stand: Mod assist;From elevated surface         General transfer comment: assist to power up.  Min guard for balance during adls    Balance                                           ADL either performed or assessed with clinical judgement   ADL   Eating/Feeding: Set up   Grooming: Set up   Upper Body Bathing: Moderate assistance   Lower Body Bathing: Maximal assistance   Upper Body Dressing : Maximal assistance           Toileting- Clothing Manipulation and Hygiene: Total assistance         General ADL Comments: performed ADL from eob and reinforced use of LUE passively during adls.  Encouraged finger ROM due to swelling and  performed elbow exercises     Vision       Perception     Praxis      Cognition Arousal/Alertness: Awake/alert Behavior During Therapy: WFL for tasks assessed/performed                                   General Comments: per chart, mild memory difficulties        Exercises     Shoulder Instructions       General Comments      Pertinent Vitals/ Pain       Pain Score: 6  Pain Location: chest Pain Descriptors / Indicators: Aching Pain Intervention(s): Limited activity within patient's tolerance;Monitored during session;Repositioned;Ice applied  Home Living Family/patient expects to be discharged to:: Skilled nursing facility                                 Additional Comments: From [facility]      Prior Functioning/Environment Level of Independence: Independent;Independent with assistive device(s)        Comments: rollator in the community; none in her apt.  Sleeps in a lift chair   Frequency  Min 2X/week  Progress Toward Goals  OT Goals(current goals can now be found in the care plan section)  Progress towards OT goals: Progressing toward goals     Plan      Co-evaluation                 AM-PAC OT "6 Clicks" Daily Activity     Outcome Measure   Help from another person eating meals?: A Little Help from another person taking care of personal grooming?: A Little Help from another person toileting, which includes using toliet, bedpan, or urinal?: A Lot Help from another person bathing (including washing, rinsing, drying)?: A Lot Help from another person to put on and taking off regular upper body clothing?: A Lot Help from another person to put on and taking off regular lower body clothing?: A Lot 6 Click Score: 14    End of Session    OT Visit Diagnosis: Unsteadiness on feet (R26.81);History of falling (Z91.81);Muscle weakness (generalized) (M62.81)   Activity Tolerance Patient  tolerated treatment well   Patient Left in bed;with call bell/phone within reach;with bed alarm set   Nurse Communication          Time: [phone] OT Time Calculation (min): 39 min  Charges: OT General Charges $OT Visit: 1 Visit OT Treatments $Self Care/Home Management : 23-37 mins $Therapeutic Activity: 8-22 mins  Nara Paternoster S, OTR/L Acute Rehabilitation Services 05/08/2019   [place] 05/08/2019, 9:59 AM

## 2019-05-08 NOTE — Plan of Care (Signed)

## 2019-05-08 NOTE — Plan of Care (Signed)
  Problem: Clinical Measurements: Goal: Respiratory complications will improve Outcome: Progressing   Problem: Clinical Measurements: Goal: Cardiovascular complication will be avoided Outcome: Progressing   Problem: Nutrition: Goal: Adequate nutrition will be maintained Outcome: Progressing   Problem: Coping: Goal: Level of anxiety will decrease Outcome: Progressing   Problem: Elimination: Goal: Will not experience complications related to bowel motility Outcome: Progressing   

## 2019-05-08 NOTE — Progress Notes (Signed)
   Subjective: 2 Days Post-Op Procedure(s) (LRB): REVERSE SHOULDER ARTHROPLASTY (Left)  Pt doing fairly well this morning Denies any new symptoms or issues Sling on currently Patient reports pain as mild.  Objective:   VITALS:   Vitals:   05/08/19 0523 05/08/19 0813  BP: 126/76   Pulse: 88 87  Resp: 20 20  Temp: (!) 97.5 F (36.4 C)   SpO2: 91% 91%    Left shoulder incision healing well nv intact distally No rashes or edema distally  LABS Recent Labs    05/06/19 0342 05/08/19 0327  HGB 11.8* 11.4*  HCT 37.1 36.0  WBC 8.6 9.8  PLT 178 167    Recent Labs    05/06/19 0342 05/08/19 0327  NA 142 138  K 3.3* 3.6  BUN 25* 17  CREATININE 0.58 0.48  GLUCOSE 112* 106*     Assessment/Plan: 2 Days Post-Op Procedure(s) (LRB): REVERSE SHOULDER ARTHROPLASTY (Left) Pt doing fairly well Shoulder is stable Recommend f/u in 2 weeks after discharge    Brad Luna [place], [place] is now [employer]  [facility] [address], [place], [place], [place] [address] Phone: [phone] [email or web address] Facebook  [employer]

## 2019-05-08 NOTE — Progress Notes (Signed)
Phone call from the Surveyor, quantity of [facility] [phone]. Rose provided with an update on patient with anticipated discharge for tomorrow by PTAR.   [place], [place] Social Work [phone]

## 2019-05-09 ENCOUNTER — Inpatient Hospital Stay (HOSPITAL_COMMUNITY): Payer: Medicare PPO

## 2019-05-09 LAB — COMPREHENSIVE METABOLIC PANEL
ALT: 22 U/L (ref 0–44)
AST: 26 U/L (ref 15–41)
Albumin: 2.9 g/dL — ABNORMAL LOW (ref 3.5–5.0)
Alkaline Phosphatase: 97 U/L (ref 38–126)
Anion gap: 8 (ref 5–15)
BUN: 15 mg/dL (ref 8–23)
CO2: 30 mmol/L (ref 22–32)
Calcium: 8.4 mg/dL — ABNORMAL LOW (ref 8.9–10.3)
Chloride: 100 mmol/L (ref 98–111)
Creatinine, Ser: 0.52 mg/dL (ref 0.44–1.00)
GFR calc Af Amer: >60 mL/min (ref >60)
GFR calc non Af Amer: >60 mL/min (ref >60)
Glucose, Bld: 104 mg/dL — ABNORMAL HIGH (ref 70–99)
Potassium: 3.3 mmol/L — ABNORMAL LOW (ref 3.5–5.1)
Sodium: 138 mmol/L (ref 135–145)
Total Bilirubin: 1.1 mg/dL (ref 0.3–1.2)
Total Protein: 5.6 g/dL — ABNORMAL LOW (ref 6.5–8.1)

## 2019-05-09 LAB — IRON AND TIBC
Iron: 29 ug/dL (ref 28–170)
Saturation Ratios: 10 % — ABNORMAL LOW (ref 10.4–31.8)
TIBC: 292 ug/dL (ref 250–450)
UIBC: 263 ug/dL

## 2019-05-09 LAB — CBC
HCT: 33.8 % — ABNORMAL LOW (ref 36.0–46.0)
Hemoglobin: 10.8 g/dL — ABNORMAL LOW (ref 12.0–15.0)
MCH: 30.7 pg (ref 26.0–34.0)
MCHC: 32 g/dL (ref 30.0–36.0)
MCV: 96 fL (ref 80.0–100.0)
Platelets: 184 10*3/uL (ref 150–400)
RBC: 3.52 MIL/uL — ABNORMAL LOW (ref 3.87–5.11)
RDW: 13.2 % (ref 11.5–15.5)
WBC: 8.1 10*3/uL (ref 4.0–10.5)
nRBC: 0 % (ref 0.0–0.2)

## 2019-05-09 LAB — PHOSPHORUS: Phosphorus: 3.3 mg/dL (ref 2.5–4.6)

## 2019-05-09 LAB — MAGNESIUM: Magnesium: 1.8 mg/dL (ref 1.7–2.4)

## 2019-05-09 MED ORDER — POTASSIUM CHLORIDE CRYS ER 20 MEQ PO TBCR
40.0000 meq | EXTENDED_RELEASE_TABLET | Freq: Once | ORAL | Status: AC
  Administered 2019-05-09: 40 meq via ORAL
  Filled 2019-05-09: qty 2

## 2019-05-09 MED ORDER — LACTULOSE 10 GM/15ML PO SOLN
30.0000 g | Freq: Once | ORAL | Status: AC
  Administered 2019-05-09: 11:00:00 30 g via ORAL
  Filled 2019-05-09: qty 45

## 2019-05-09 MED ORDER — METHOCARBAMOL 500 MG PO TABS
500.0000 mg | ORAL_TABLET | Freq: Three times a day (TID) | ORAL | Status: DC | PRN
  Administered 2019-05-09 – 2019-05-11 (×3): 500 mg via ORAL
  Filled 2019-05-09 (×3): qty 1

## 2019-05-09 NOTE — Progress Notes (Signed)
PROGRESS NOTE    [name]  [name] DOB: [birth year] DOA: 05/03/2019 PCP: Leighton Ruff, MD    Brief Narrative:  [name] is a 86 y.o. female with medical history significant of mild memory disorder, COPD lives in assisted living comes in after mechanical fall injuring her left upper arm.  Patient denies any recent illnesses.  She denies any loss of consciousness.  She reports left arm pain.  She suffered a lack to her chin and brow which has been repaired emergency department.  Patient found to have a left humerus fracture and a mandibular fracture both orthopedic surgery and ENT have been called who will see patient in the morning.  Her humerus will likely need surgery.  Patient be referred for admission for orthopedic injuries in a patient with multiple other medical problems.  Her pain is much better after receiving fentanyl in the emergency department.  She lives alone at her assisted living. Alert and oriented with mild cognitive impairment. Orthopedics planning for right shoulder arthroplasty tomorrow 05/06/2019. Ortho plan for reverse right shoulder arthroplasty today 1/28/2021at 4 PM.  Reported decreased urine output/retention.  Appears to have resolved.  No new complaints today.  Potassium and phosphorus low.  We will replete with potassium chloride and sodium potassium phosphate p.o. 05/07/2019: Patient is status post revised left shoulder arthroplasty by Ortho.  Sitting out in chair and eating breakfast.  Not in acute distress. Getting pain management. Seen by Ortho today.  Plan is for OT evaluation. Ortho is okay for discharge when medically stable.   05/09/2019: Patient developed acute urianary retention requiring foley catheter. Managing with flomax. Will need voiding trial and discontinuation of foley prior to discharge.   Assessment & Plan:   Principal Problem:   Humerus fracture Active Problems:   COPD (chronic obstructive pulmonary disease) ([place])  Essential hypertension   Fall  Clinical problems list 1.  Mechanical fall with left proximal humeral neck fracture with displacement and comminution 2.  Right mandible condyle fracture 3.  Chronic obstructive pulmonary disease 4.  Essential hypertension  5.  Seizure disorder 6. Hypophosphatemia 7.  Acute urinary retention   1.  Mechanical fall with left proximal humeral neck fracture with displacement and comminution. Ortho has seen and evaluated and plan was for reverse shoulder arthroplasty. Continue with shoulder sling Continue with pain management  2.  Right mandibular condyle fracture.  Patient was evaluated by oral maxillofacial/plastic surgery and recommended liquid diet and no surgical intervention for mandibular fracture. Maxillofacial surgery has no plans for surgical intervention.  3.  Chronic obstructive pulmonary disease.  Not in acute exacerbation and no home oxygen use.  Continue to monitor Continue with bronchodilators as needed  4.  Essential hypertension.  Patient is currently normotensive  Continue to monitor  5.  Seizure disorder.  Stable Start p.o. Keppra in preparation for discharge home tomorrow.    6. Hypophosphatemia Will replete with phosphate IV Continue to monitor.  7.  Acute urinary retention.  Patient with a history of bladder incontinence now presenting with acute urinary retention.  Etiology uncertain possibly opiate induced-patient receiving increasing opiates for pain due to shoulder left humeral fracture status post right shoulder arthroplasty. C/w Flomax and decrease opiates Maintain Foley catheter for 24-48 hours and remove for voiding trial.  DVT prophylaxis: Lovenox subcute  Code Status: DNR Family Communication: None at bedside. Disposition plan: Patient is from assisted living facility and  will be discharged to assisted facility. Barriers to discharge include acute urinary retention  requiring placement of Foley catheter and  respiratory distress with hypoxia requiring 2 L O2 by nasal cannula.   Reassess with voiding trial and saturation on room air. If stable, discharge to facility   Consultants:   Orthopedics  Plastic surgery  Procedures: None   Antimicrobials: None    Subjective: Patient was seen and examined at bedside.  Laying in bed not in acute distress.  Her sister is at bedside.  Patient tolerating liquid diet. Had acute urinary retention requiring insertion of Foley catheter. Initiated Flomax with decrease opiate doses.   Will try voiding trial tomorrow C/w bronchodilators and try to wean off oxygen.  Objective: Vitals:   05/08/19 1539 05/08/19 2117 05/09/19 0503 05/09/19 0749  BP:  128/87 119/76   Pulse: 84 99 (!) 101 96  Resp: 18 20 17 18   Temp:  98.7 F (37.1 C) 98.1 F (36.7 C)   TempSrc:  Oral Oral   SpO2: 96% 94% 91% 90%  Weight:      Height:        Intake/Output Summary (Last 24 hours) at 05/09/2019 1032 Last data filed at 05/09/2019 0947 Gross per 24 hour  Intake 2917.98 ml  Output 3875 ml  Net -957.02 ml   Filed Weights   05/03/19 2103 05/07/19 0500  Weight: 74.8 kg 81 kg    Examination:  General exam: Appears calm and comfortable.  Not in acute distress Respiratory system: Clear to auscultation. Respiratory effort normal. Cardiovascular system: S1 & S2 heard, RRR. No JVD, murmurs, rubs, gallops or clicks. No pedal edema. Gastrointestinal system: Abdomen is nondistended, soft and nontender. No organomegaly or masses felt. Normal bowel sounds heard. Central nervous system: Alert and oriented. No focal neurological deficits. Extremities: On left shoulder sling . Skin: No rashes, lesions or ulcers Psychiatry: Judgement and insight appear normal. Mood & affect appropriate.     Data Reviewed: I have personally reviewed following labs and imaging studies  CBC: Recent Labs  Lab 05/04/19 0025 05/04/19 0025 05/04/19 0449 05/05/19 0330 05/06/19 0342 05/08/19  0327 05/09/19 0338  WBC 13.3*   < > 10.5 9.8 8.6 9.8 8.1  NEUTROABS 11.3*  --   --   --   --   --   --   HGB 13.6   < > 13.8 13.7 11.8* 11.4* 10.8*  HCT 41.6   < > 42.6 42.0 37.1 36.0 33.8*  MCV 93.7   < > 94.2 95.0 95.6 98.1 96.0  PLT 198   < > 203 194 178 167 184   < > = values in this interval not displayed.   Basic Metabolic Panel: Recent Labs  Lab 05/04/19 0449 05/05/19 0330 05/06/19 0342 05/08/19 0327 05/09/19 0338  NA 139 140 142 138 138  K 3.5 3.7 3.3* 3.6 3.3*  CL 104 105 109 103 100  CO2 24 25 25 26 30   GLUCOSE 138* 121* 112* 106* 104*  BUN 27* 21 25* 17 15  CREATININE 0.76 0.58 0.58 0.48 0.52  CALCIUM 8.9 8.7* 8.8* 8.3* 8.4*  MG  --  2.2 2.2 1.8 1.8  PHOS  --  2.6 1.6* 2.1* 3.3   GFR: Estimated Creatinine Clearance: 55.6 mL/min (by C-G formula based on SCr of 0.52 mg/dL). Liver Function Tests: Recent Labs  Lab 05/04/19 0025 05/05/19 0330 05/06/19 0342 05/08/19 0327 05/09/19 0338  AST 23 20 16 29 26   ALT 17 15 14 23 22   ALKPHOS 76 78 68 106 97  BILITOT 1.1 1.2 1.0 1.5* 1.1  PROT 6.7 6.7 6.1* 5.7* 5.6*  ALBUMIN 3.9 3.8 3.4* 3.1* 2.9*   No results for input(s): LIPASE, AMYLASE in the last 168 hours. No results for input(s): AMMONIA in the last 168 hours. Coagulation Profile: No results for input(s): INR, PROTIME in the last 168 hours. Cardiac Enzymes: No results for input(s): CKTOTAL, CKMB, CKMBINDEX, TROPONINI in the last 168 hours. BNP (last 3 results) No results for input(s): PROBNP in the last 8760 hours. HbA1C: No results for input(s): HGBA1C in the last 72 hours. CBG: No results for input(s): GLUCAP in the last 168 hours. Lipid Profile: No results for input(s): CHOL, HDL, LDLCALC, TRIG, CHOLHDL, LDLDIRECT in the last 72 hours. Thyroid Function Tests: No results for input(s): TSH, T4TOTAL, FREET4, T3FREE, THYROIDAB in the last 72 hours. Anemia Panel: Recent Labs    05/09/19 0805  TIBC 292  IRON 29   Sepsis Labs: No results for  input(s): PROCALCITON, LATICACIDVEN in the last 168 hours.  Recent Results (from the past 240 hour(s))  Respiratory Panel by RT PCR (Flu A&B, Covid) - Nasopharyngeal Swab     Status: None   Collection Time: 05/03/19 11:30 PM   Specimen: Nasopharyngeal Swab  Result Value Ref Range Status   SARS Coronavirus 2 by RT PCR NEGATIVE NEGATIVE Final    Comment: (NOTE) SARS-CoV-2 target nucleic acids are NOT DETECTED. The SARS-CoV-2 RNA is generally detectable in upper respiratoy specimens during the acute phase of infection. The lowest concentration of SARS-CoV-2 viral copies this assay can detect is 131 copies/mL. A negative result does not preclude SARS-Cov-2 infection and should not be used as the sole basis for treatment or other patient management decisions. A negative result may occur with  improper specimen collection/handling, submission of specimen other than nasopharyngeal swab, presence of viral mutation(s) within the areas targeted by this assay, and inadequate number of viral copies (<131 copies/mL). A negative result must be combined with clinical observations, patient history, and epidemiological information. The expected result is Negative. Fact Sheet for Patients:  PinkCheek.be Fact Sheet for Healthcare Providers:  GravelBags.it This test is not yet ap proved or cleared by the [place] FDA and  has been authorized for detection and/or diagnosis of SARS-CoV-2 by FDA under an Emergency Use Authorization (EUA). This EUA will remain  in effect (meaning this test can be used) for the duration of the COVID-19 declaration under Section 564(b)(1) of the Act, 21 U.S.C. section 360bbb-3(b)(1), unless the authorization is terminated or revoked sooner.    Influenza A by PCR NEGATIVE NEGATIVE Final   Influenza B by PCR NEGATIVE NEGATIVE Final    Comment: (NOTE) The Xpert Xpress SARS-CoV-2/FLU/RSV assay is intended as an aid  in  the diagnosis of influenza from Nasopharyngeal swab specimens and  should not be used as a sole basis for treatment. Nasal washings and  aspirates are unacceptable for Xpert Xpress SARS-CoV-2/FLU/RSV  testing. Fact Sheet for Patients: PinkCheek.be Fact Sheet for Healthcare Providers: GravelBags.it This test is not yet approved or cleared by the [place] FDA and  has been authorized for detection and/or diagnosis of SARS-CoV-2 by  FDA under an Emergency Use Authorization (EUA). This EUA will remain  in effect (meaning this test can be used) for the duration of the  Covid-19 declaration under Section 564(b)(1) of the Act, 21  U.S.C. section 360bbb-3(b)(1), unless the authorization is  terminated or revoked. Performed at [facility], [place] [address], [place], [place] [address]   SARS CORONAVIRUS 2 (TAT 6-24 HRS)  Nasopharyngeal Nasopharyngeal Swab     Status: None   Collection Time: 05/07/19  1:24 PM   Specimen: Nasopharyngeal Swab  Result Value Ref Range Status   SARS Coronavirus 2 NEGATIVE NEGATIVE Final    Comment: (NOTE) SARS-CoV-2 target nucleic acids are NOT DETECTED. The SARS-CoV-2 RNA is generally detectable in upper and lower respiratory specimens during the acute phase of infection. Negative results do not preclude SARS-CoV-2 infection, do not rule out co-infections with other pathogens, and should not be used as the sole basis for treatment or other patient management decisions. Negative results must be combined with clinical observations, patient history, and epidemiological information. The expected result is Negative. Fact Sheet for Patients: SugarRoll.be Fact Sheet for Healthcare Providers: https://www.woods-mathews.com/ This test is not yet approved or cleared by the [place] FDA and  has been authorized for detection and/or diagnosis of  SARS-CoV-2 by FDA under an Emergency Use Authorization (EUA). This EUA will remain  in effect (meaning this test can be used) for the duration of the COVID-19 declaration under Section 56 4(b)(1) of the Act, 21 U.S.C. section 360bbb-3(b)(1), unless the authorization is terminated or revoked sooner. Performed at [facility], [place] [address] [place], [place] [address]          Radiology Studies: No results found.      Scheduled Meds: . amLODipine  5 mg Oral Daily  . aspirin EC  81 mg Oral Daily  . budesonide (PULMICORT) nebulizer solution  0.25 mg Nebulization BID  . Chlorhexidine Gluconate Cloth  6 each Topical Daily  . docusate sodium  100 mg Oral BID  . feeding supplement (ENSURE ENLIVE)  237 mL Oral TID BM  . lactulose  30 g Oral Once  . lamoTRIgine  150 mg Oral BID  . levETIRAcetam  250 mg Oral Daily   And  . levETIRAcetam  500 mg Oral QHS  . memantine  10 mg Oral BID  . polyethylene glycol  17 g Oral Daily  . pramipexole  0.25 mg Oral Daily  . senna-docusate  1 tablet Oral BID  . simvastatin  10 mg Oral Daily  . tamsulosin  0.4 mg Oral Daily  . tiotropium  18 mcg Inhalation Daily   Continuous Infusions: . sodium chloride Stopped (05/07/19 1747)     LOS: 5 days    Time spent: [place], MD [facility] Pager [phone]   If 7PM-7AM, please contact night-coverage [email or web address] Password [facility] 05/09/2019, 10:32 AM

## 2019-05-09 NOTE — TOC Initial Note (Addendum)
Transition of Care [facility]) - Initial/Assessment Note    Patient Details  Name: [name] MRN: [id number] Date of Birth: [birth year]  Transition of Care [facility]) CM/SW Contact:    Greg Cutter, LCSW Phone Number: 05/09/2019, 11:42 AM  Clinical Narrative:   LCSW completed care coordination with nurse supervisor Rose at [facility] [phone]). Patient was tested NEGATIVE for COVID. Facility needs discharge order to be faxed ASAP to [facility] at [phone] once completed. Rose wanted to inform LCSW that their Pharmacy is closed today but will reopen tomorrow. Patient will discharge back to ALF tomorrow once d/c order has been completed and paperwork has been faxed to ALF and received/confirmed.    Expected Discharge Plan: Skilled Nursing Facility Barriers to Discharge: Other (comment)(covid-19 test)   Patient Goals and CMS Choice     Choice offered to / list presented to : NA  Expected Discharge Plan and Services Expected Discharge Plan: [place] In-house Referral: Clinical Social Work Discharge Planning Services: CM Consult   Living arrangements for the past 2 months: [place]                   Prior Living Arrangements/Services Living arrangements for the past 2 months: [place] Lives with:: Facility Resident Patient language and need for interpreter reviewed:: Yes Do you feel safe going back to the place where you live?: Yes      Need for Family Participation in Patient Care: Yes (Comment) Care giver support system in place?: Yes (comment) Current home services: DME Criminal Activity/Legal Involvement Pertinent to Current Situation/Hospitalization: No - Comment as needed  Activities of Daily Living Home Assistive Devices/Equipment: Walker (specify type) ADL Screening (condition at time of admission) Patient's cognitive ability adequate to safely complete daily activities?: Yes Is the patient deaf or have  difficulty hearing?: No Does the patient have difficulty seeing, even when wearing glasses/contacts?: No Does the patient have difficulty concentrating, remembering, or making decisions?: Yes Patient able to express need for assistance with ADLs?: Yes Does the patient have difficulty dressing or bathing?: Yes Independently performs ADLs?: No Communication: Independent Dressing (OT): Needs assistance Is this a change from baseline?: Pre-admission baseline Grooming: Needs assistance Is this a change from baseline?: Pre-admission baseline Feeding: Independent Bathing: Needs assistance Is this a change from baseline?: Pre-admission baseline Toileting: Independent In/Out Bed: Independent Walks in Home: Independent Does the patient have difficulty walking or climbing stairs?: Yes Weakness of Legs: Both Weakness of Arms/Hands: Left  Permission Sought/Granted Permission sought to share information with : Case Manager, Family Supports Permission granted to share information with : Yes, Verbal Permission Granted     Permission granted to share info w AGENCY: [place] granted to share info w Relationship: Daughter Inez Catalina  Permission granted to share info w Contact Information: [phone]  Emotional Assessment Appearance:: Appears stated age Attitude/Demeanor/Rapport: Engaged Affect (typically observed): Accepting, Pleasant Orientation: : Oriented to Self, Oriented to Place, Oriented to  Time, Oriented to Situation Alcohol / Substance Use: Not Applicable Psych Involvement: No (comment)  Admission diagnosis:  Humerus fracture [S42.309A] Fall, initial encounter [W19.XXXA] Chin laceration, initial encounter [S01.81XA] Closed fracture of right mandibular angle, initial encounter ([place]) [S02.651A] Closed fracture of proximal end of left humerus, unspecified fracture morphology, initial encounter [S42.202A] Patient Active Problem List   Diagnosis Date Noted  .  Humerus fracture 05/04/2019  . Right thyroid nodule 02/28/2016  . Abnormal thyroid uptake 11/10/2015  . Coronary artery calcification 12/05/2014  .  Panlobular emphysema ([place]) 07/25/2014  . Dyspnea and respiratory abnormality 07/25/2014  . Multiple lung nodules on CT 07/25/2014  . Smoking history 07/25/2014  . Pain of right lower leg 05/23/2014  . Fall 05/23/2014  . Gait disorder 04/25/2014  . Memory disorder 04/25/2014  . Essential tremor 04/25/2014  . Advanced directives, counseling/discussion 02/18/2013  . Seizure disorder ([place]) 10/15/2012  . Cancer of skin, squamous cell 10/15/2012  . Unspecified vitamin D deficiency 10/15/2012  . Other and unspecified hyperlipidemia 10/15/2012  . Edema 09/10/2012  . Restless legs syndrome (RLS) 09/12/2011  . Insomnia, unspecified 09/12/2011  . COPD (chronic obstructive pulmonary disease) ([place] [place]) 05/23/2011  . Essential hypertension 01/31/2011  . Senile osteoporosis 01/31/2011  . Major depressive disorder, single episode 01/31/2011   PCP:  Leighton Ruff, MD Pharmacy:   [place], [place] [address] Phone: [phone] Fax: [phone]   Readmission Risk Interventions No flowsheet data found.  Eula Fried, BSW, MSW, [email or web address]

## 2019-05-09 NOTE — Progress Notes (Signed)
     Subjective: 3 Days Post-Op Procedure(s) (LRB): REVERSE SHOULDER ARTHROPLASTY (Left)   Patient reports pain as mild, regarding the left shoulder Some chest pain from her fall, but breathing better today Sling on currently    Objective:   VITALS:   Vitals:   05/09/19 0503 05/09/19 0749  BP: 119/76   Pulse: (!) 101 96  Resp: 17 18  Temp: 98.1 F (36.7 C)   SpO2: 91% 90%    Left shoulder incision healing well nv intact distally No rashes or edema distally Ecchymosis about the left shoulder/chest   LABS Recent Labs    05/08/19 0327 05/09/19 0338  HGB 11.4* 10.8*  HCT 36.0 33.8*  WBC 9.8 8.1  PLT 167 184    Recent Labs    05/08/19 0327 05/09/19 0338  NA 138 138  K 3.6 3.3*  BUN 17 15  CREATININE 0.48 0.52  GLUCOSE 106* 104*     Assessment/Plan: 3 Days Post-Op Procedure(s) (LRB): REVERSE SHOULDER ARTHROPLASTY (Left)  Shoulder appears to be stable Work with therapy Added Robaxin to help with any potential muscle spasms Discharge to SNF when ready F/u 2 weeks after discharge    [name]. [name]   PAC  05/09/2019, 8:19 AM

## 2019-05-09 NOTE — Progress Notes (Signed)
Physical Therapy Treatment Patient Details Name: [name] MRN: [id number] DOB: 25-Jul-1932 Today's Date: 05/09/2019    History of Present Illness 86 y o female s/p fall resulting in L displaced 4 part humerus fx and mandibular condyle fx.  S/o L RTSA    PT Comments    Pt very cooperative but with increased fatigue.  Pt assisted from chair to ambulate short distance and return to bed for chest Xray.   Follow Up Recommendations  SNF     Equipment Recommendations  None recommended by PT    Recommendations for Other Services       Precautions / Restrictions Precautions Precautions: Fall;Shoulder Type of Shoulder Precautions: may use LUE passively during adls within the following parameters:  10 ER, 45 ABD, 60 FF.  May move elbow wrist and fingers.  May dangle during shower.  NO PENDULUMS Shoulder Interventions: Shoulder sling/immobilizer Precaution Booklet Issued: Yes (comment) Restrictions Weight Bearing Restrictions: Yes Other Position/Activity Restrictions: LUE NWB    Mobility  Bed Mobility Overal bed mobility: Needs Assistance Bed Mobility: Sit to Supine     Supine to sit: HOB elevated;Mod assist Sit to supine: Mod assist;HOB elevated;+2 for physical assistance;+2 for safety/equipment   General bed mobility comments: Cues for sequence; physical assist to manage LEs onto bed and to control trunk.hun  Transfers Overall transfer level: Needs assistance Equipment used: Rolling walker (2 wheeled) Transfers: Sit to/from Stand Sit to Stand: Mod assist Stand pivot transfers: Mod assist       General transfer comment: assist to power up and balance in intial standing  Ambulation/Gait Ambulation/Gait assistance: Min assist Gait Distance (Feet): 5 Feet Assistive device: Rolling walker (2 wheeled) Gait Pattern/deviations: Step-to pattern;Decreased step length - right;Decreased step length - left;Shuffle;Trunk flexed Gait velocity: decr   General Gait Details: Pt  with one hand on RW and PT stabilizing other side.    Stairs             Wheelchair Mobility    Modified Rankin (Stroke Patients Only)       Balance Overall balance assessment: History of Falls;Needs assistance Sitting-balance support: Feet supported Sitting balance-Leahy Scale: Good     Standing balance support: Single extremity supported Standing balance-Leahy Scale: Poor                              Cognition Arousal/Alertness: Awake/alert Behavior During Therapy: WFL for tasks assessed/performed                                   General Comments: per chart, mild memory difficulties      Exercises      General Comments        Pertinent Vitals/Pain Pain Assessment: 0-10 Pain Score: 4  Pain Location: chest Pain Descriptors / Indicators: Aching Pain Intervention(s): Limited activity within patient's tolerance;Monitored during session;Premedicated before session    Home Living                      Prior Function            PT Goals (current goals can now be found in the care plan section) Acute Rehab PT Goals Patient Stated Goal: return to independence PT Goal Formulation: With patient Time For Goal Achievement: 05/14/19 Potential to Achieve Goals: Fair Progress towards PT goals: Progressing toward goals    Frequency  Min 3X/week      PT Plan Current plan remains appropriate    Co-evaluation              AM-PAC PT "6 Clicks" Mobility   Outcome Measure  Help needed turning from your back to your side while in a flat bed without using bedrails?: A Lot Help needed moving from lying on your back to sitting on the side of a flat bed without using bedrails?: A Lot Help needed moving to and from a bed to a chair (including a wheelchair)?: A Lot Help needed standing up from a chair using your arms (e.g., wheelchair or bedside chair)?: A Lot Help needed to walk in hospital room?: A Lot Help needed  climbing 3-5 steps with a railing? : Total 6 Click Score: 11    End of Session Equipment Utilized During Treatment: Gait belt Activity Tolerance: Patient limited by fatigue;Patient limited by pain Patient left: in bed;with call bell/phone within reach;with nursing/sitter in room Nurse Communication: Mobility status PT Visit Diagnosis: Unsteadiness on feet (R26.81);Muscle weakness (generalized) (M62.81);Difficulty in walking, not elsewhere classified (R26.2);History of falling (Z91.81)     Time: [id number] PT Time Calculation (min) (ACUTE ONLY): 14 min  Charges:  $Gait Training: 23-37 mins $Therapeutic Activity: 8-22 mins                     Debe Coder PT Acute Rehabilitation Services Pager [phone] Office [phone]    Lanya Bucks 05/09/2019, 12:26 PM

## 2019-05-09 NOTE — Plan of Care (Signed)
  Problem: Clinical Measurements: Goal: Respiratory complications will improve Outcome: Progressing   Problem: Clinical Measurements: Goal: Cardiovascular complication will be avoided Outcome: Progressing   Problem: Activity: Goal: Risk for activity intolerance will decrease Outcome: Progressing   Problem: Coping: Goal: Level of anxiety will decrease Outcome: Progressing   Problem: Nutrition: Goal: Adequate nutrition will be maintained Outcome: Progressing

## 2019-05-09 NOTE — Progress Notes (Signed)
Physical Therapy Treatment Patient Details Name: [name] MRN: [id number] DOB: 1933/03/04 Today's Date: 05/09/2019    History of Present Illness 86 y o female s/p fall resulting in L displaced 4 part humerus fx and mandibular condyle fx.  S/o L RTSA    PT Comments    Pt motivated and progressing slowly and steadily with mobility but requires increased time for performance of all tasks.  Follow Up Recommendations  SNF     Equipment Recommendations  None recommended by PT    Recommendations for Other Services       Precautions / Restrictions Precautions Precautions: Fall;Shoulder Type of Shoulder Precautions: may use LUE passively during adls within the following parameters:  10 ER, 45 ABD, 60 FF.  May move elbow wrist and fingers.  May dangle during shower.  NO PENDULUMS Shoulder Interventions: Shoulder sling/immobilizer Precaution Booklet Issued: Yes (comment) Restrictions Weight Bearing Restrictions: Yes Other Position/Activity Restrictions: LUE NWB    Mobility  Bed Mobility Overal bed mobility: Needs Assistance Bed Mobility: Supine to Sit;Sit to Supine     Supine to sit: HOB elevated;Mod assist     General bed mobility comments: increased time with cues for sequence and use of bil LEs to manouver to EOB.  Physical assist required to bring trunk to upriight and complete tranistion to EOB sitting utilizing pad  Transfers Overall transfer level: Needs assistance Equipment used: Rolling walker (2 wheeled) Transfers: Sit to/from Stand Sit to Stand: Mod assist;From elevated surface         General transfer comment: assist to power up and balance in intial standing  Ambulation/Gait Ambulation/Gait assistance: Min assist Gait Distance (Feet): 42 Feet Assistive device: Rolling walker (2 wheeled) Gait Pattern/deviations: Step-to pattern;Decreased step length - right;Decreased step length - left;Shuffle;Trunk flexed Gait velocity: decr   General Gait  Details: Pt with one hand on RW and PT stabilizing other side.    Stairs             Wheelchair Mobility    Modified Rankin (Stroke Patients Only)       Balance Overall balance assessment: History of Falls;Needs assistance Sitting-balance support: Feet supported Sitting balance-Leahy Scale: Good     Standing balance support: Single extremity supported Standing balance-Leahy Scale: Poor                              Cognition Arousal/Alertness: Awake/alert Behavior During Therapy: WFL for tasks assessed/performed                                   General Comments: per chart, mild memory difficulties      Exercises      General Comments        Pertinent Vitals/Pain Pain Assessment: 0-10 Pain Score: 4  Pain Location: chest Pain Descriptors / Indicators: Aching Pain Intervention(s): Limited activity within patient's tolerance;Monitored during session;Premedicated before session    Home Living                      Prior Function            PT Goals (current goals can now be found in the care plan section) Acute Rehab PT Goals Patient Stated Goal: return to independence PT Goal Formulation: With patient Time For Goal Achievement: 05/14/19 Potential to Achieve Goals: Fair Progress towards PT goals: Progressing toward goals  Frequency    Min 3X/week      PT Plan Current plan remains appropriate    Co-evaluation              AM-PAC PT "6 Clicks" Mobility   Outcome Measure  Help needed turning from your back to your side while in a flat bed without using bedrails?: A Lot Help needed moving from lying on your back to sitting on the side of a flat bed without using bedrails?: A Lot Help needed moving to and from a bed to a chair (including a wheelchair)?: A Lot Help needed standing up from a chair using your arms (e.g., wheelchair or bedside chair)?: A Lot Help needed to walk in hospital room?: A  Lot Help needed climbing 3-5 steps with a railing? : Total 6 Click Score: 11    End of Session Equipment Utilized During Treatment: Gait belt Activity Tolerance: Patient limited by fatigue;Patient limited by pain Patient left: in chair;with call bell/phone within reach;with chair alarm set Nurse Communication: Mobility status PT Visit Diagnosis: Unsteadiness on feet (R26.81);Muscle weakness (generalized) (M62.81);Difficulty in walking, not elsewhere classified (R26.2);History of falling (Z91.81)     Time: 0820-0900 PT Time Calculation (min) (ACUTE ONLY): 40 min  Charges:  $Gait Training: 23-37 mins $Therapeutic Activity: 8-22 mins                     Debe Coder PT Acute Rehabilitation Services Pager [phone] Office [phone]     Cohen Boettner 05/09/2019, 12:21 PM

## 2019-05-10 ENCOUNTER — Inpatient Hospital Stay (HOSPITAL_COMMUNITY): Payer: Medicare PPO

## 2019-05-10 LAB — CBC
HCT: 33.9 % — ABNORMAL LOW (ref 36.0–46.0)
Hemoglobin: 10.9 g/dL — ABNORMAL LOW (ref 12.0–15.0)
MCH: 30.8 pg (ref 26.0–34.0)
MCHC: 32.2 g/dL (ref 30.0–36.0)
MCV: 95.8 fL (ref 80.0–100.0)
Platelets: 201 10*3/uL (ref 150–400)
RBC: 3.54 MIL/uL — ABNORMAL LOW (ref 3.87–5.11)
RDW: 13 % (ref 11.5–15.5)
WBC: 8.2 10*3/uL (ref 4.0–10.5)
nRBC: 0 % (ref 0.0–0.2)

## 2019-05-10 LAB — MAGNESIUM: Magnesium: 2 mg/dL (ref 1.7–2.4)

## 2019-05-10 LAB — COMPREHENSIVE METABOLIC PANEL
ALT: 21 U/L (ref 0–44)
AST: 25 U/L (ref 15–41)
Albumin: 2.9 g/dL — ABNORMAL LOW (ref 3.5–5.0)
Alkaline Phosphatase: 91 U/L (ref 38–126)
Anion gap: 7 (ref 5–15)
BUN: 13 mg/dL (ref 8–23)
CO2: 28 mmol/L (ref 22–32)
Calcium: 8.7 mg/dL — ABNORMAL LOW (ref 8.9–10.3)
Chloride: 104 mmol/L (ref 98–111)
Creatinine, Ser: 0.55 mg/dL (ref 0.44–1.00)
GFR calc Af Amer: >60 mL/min (ref >60)
GFR calc non Af Amer: >60 mL/min (ref >60)
Glucose, Bld: 99 mg/dL (ref 70–99)
Potassium: 3.5 mmol/L (ref 3.5–5.1)
Sodium: 139 mmol/L (ref 135–145)
Total Bilirubin: 1.4 mg/dL — ABNORMAL HIGH (ref 0.3–1.2)
Total Protein: 5.5 g/dL — ABNORMAL LOW (ref 6.5–8.1)

## 2019-05-10 LAB — PHOSPHORUS: Phosphorus: 3 mg/dL (ref 2.5–4.6)

## 2019-05-10 MED ORDER — LEVOFLOXACIN IN D5W 750 MG/150ML IV SOLN
750.0000 mg | INTRAVENOUS | Status: DC
  Administered 2019-05-10 – 2019-05-12 (×3): 750 mg via INTRAVENOUS
  Filled 2019-05-10 (×4): qty 150

## 2019-05-10 MED ORDER — LEVOFLOXACIN IN D5W 500 MG/100ML IV SOLN
500.0000 mg | INTRAVENOUS | Status: DC
  Filled 2019-05-10: qty 100

## 2019-05-10 MED ORDER — BISACODYL 10 MG RE SUPP
10.0000 mg | Freq: Once | RECTAL | Status: AC
  Administered 2019-05-11: 10 mg via RECTAL
  Filled 2019-05-10: qty 1

## 2019-05-10 NOTE — Progress Notes (Signed)
PROGRESS NOTE    [name]  [name] DOB: March 19, 1933 DOA: 05/03/2019   PCP: Leighton Ruff, MD   Brief Narrative: [name] a 86 y.o.femalewith medical history significant ofmild memory disorder, COPD lives in assisted living comes in after mechanical fall injuring her left upper arm. Patient denies any recent illnesses. She denies any loss of consciousness. She reports left arm pain. She suffered a lack to her chin and brow which has been repaired emergency department. Patient found to have a left humerus fracture and a mandibular fracture both orthopedic surgery and ENT have been called who will see patient in the morning. Her humerus will likely need surgery. Patient be referred for admission for orthopedic injuries in a patient with multiple other medical problems. Her pain is much better after receiving fentanyl in the emergency department. She lives alone at her assisted living. Alert and oriented with mild cognitive impairment. Orthopedics planning for right shoulder arthroplasty on 05/06/2019. Ortho plan for reverse right shoulder arthroplasty today 1/28/2021at 4 PM.  Reported decreased urine output/retention.  Appears to have resolved.   05/07/2019: Patient is status post revised left shoulder arthroplasty by Ortho.  Sitting out in chair and eating breakfast.  Not in acute distress. Getting pain management. Seen by Ortho today.  Plan is for OT evaluation. Ortho is okay for discharge when medically stable.   05/09/2019: Patient developed acute urianary retention requiring foley catheter. Managing with flomax. Will need voiding trial and discontinuation of foley prior to discharge. 2/1: Patient complains of chest pain and cough, chest x-ray shows worsening atelectasis, consider Levaquin for possible pneumonia. She is still requiring 2 L supplemental oxygen saturation is 94%.  Assessment & Plan:   Principal Problem:   Humerus fracture Active Problems:  COPD (chronic obstructive pulmonary disease) ([place])   Essential hypertension   Fall  1.  Mechanical fall with left proximal humeral neck fracture with displacement and comminution. Ortho is actively following this patient, s/p  reverse shoulder arthroplasty postoperative day 4 Continue with shoulder sling Continue with pain management  2.  Right mandibular condyle fracture.  Patient was evaluated by oral maxillofacial/plastic surgery and recommended liquid diet and no surgical intervention for mandibular fracture. Maxillofacial surgery has no plans for surgical intervention.  3.  Chronic obstructive pulmonary disease.  Not in acute exacerbation and no home oxygen use.  Continue to monitor Continue with bronchodilators as needed.  Postoperatively she is requiring 2 L supplemental oxygen saturation is 94%. We will try to taper off supplemental oxygen, continue to monitor.  Chest x-ray shows worsening atelectasis. Patient complains of chest pain cough, consider Levaquin for possible pneumonia.  4.  Essential hypertension.  Patient is currently normotensive  Continue to monitor  5.  Seizure disorder.  Stable Start p.o. Keppra in preparation for discharge home tomorrow.    6. Hypophosphatemia Will replete with phosphate IV Continue to monitor.  7.  Acute urinary retention.  Patient with a history of bladder incontinence now presenting with acute urinary retention.  Etiology uncertain possibly opiate induced-patient receiving increasing opiates for pain due to shoulder left humeral fracture status post right shoulder arthroplasty. C/w Flomax and decrease opiates Maintain Foley catheter for 24-48 hours and remove for voiding trial.  DVT prophylaxis: Lovenox subcute  Code Status: DNR Family Communication: None at bedside. Disposition plan: Patient is from assisted living facility and  will be discharged to assisted facility. Barriers to discharge include acute urinary retention  requiring placement of Foley catheter and respiratory distress with  hypoxia requiring 2 L O2 by nasal cannula.   Reassess with voiding trial and saturation on room air. If stable, discharge to facility  Consultants:   Orthopedics  Plastic surgery   Procedures: None  Antimicrobials:  Anti-infectives (From admission, onward)   Start     Dose/Rate Route Frequency Ordered Stop   05/10/19 1400  levofloxacin (LEVAQUIN) IVPB 500 mg     500 mg 100 mL/hr over 60 Minutes Intravenous Every 24 hours 05/10/19 1234     05/06/19 0600  ceFAZolin (ANCEF) IVPB 2g/100 mL premix     2 g 200 mL/hr over 30 Minutes Intravenous On call to O.R. 05/06/19 0331 05/06/19 1618      Subjective: Patient seen and examined at bedside.  She complains of cough, chest pain and mild shortness of breath. Chest x-ray shows worsening atelectasis.  Objective: Vitals:   05/09/19 2222 05/10/19 0704 05/10/19 0820 05/10/19 1009  BP: 134/86 (!) 148/91  117/69  Pulse: 96 100  95  Resp: 16 18  18   Temp: 98.1 F (36.7 C) 97.8 F (36.6 C)  98.1 F (36.7 C)  TempSrc: Oral Oral  Oral  SpO2: 96% 92% 94% 95%  Weight:      Height:        Intake/Output Summary (Last 24 hours) at 05/10/2019 1234 Last data filed at 05/10/2019 0947 Gross per 24 hour  Intake 1030 ml  Output 2725 ml  Net -1695 ml   Filed Weights   05/03/19 2103 05/07/19 0500  Weight: 74.8 kg 81 kg    Examination:  General exam: Appears calm and comfortable , no acute distress Respiratory system: Clear to auscultation. Respiratory effort normal. Cardiovascular system: S1 & S2 heard, RRR. No JVD, murmurs, rubs, gallops or clicks. No pedal edema. Gastrointestinal system: Abdomen is nondistended, soft and nontender. No organomegaly or masses felt. Normal bowel sounds heard. Central nervous system: Alert and oriented. No focal neurological deficits. Extremities: Left shoulder sling Skin: No rashes, lesions or ulcers Psychiatry: Judgement and insight  appear normal. Mood & affect appropriate.    Data Reviewed: I have personally reviewed following labs and imaging studies  CBC: Recent Labs  Lab 05/04/19 0025 05/04/19 0449 05/05/19 0330 05/06/19 0342 05/08/19 0327 05/09/19 0338 05/10/19 0341  WBC 13.3*   < > 9.8 8.6 9.8 8.1 8.2  NEUTROABS 11.3*  --   --   --   --   --   --   HGB 13.6   < > 13.7 11.8* 11.4* 10.8* 10.9*  HCT 41.6   < > 42.0 37.1 36.0 33.8* 33.9*  MCV 93.7   < > 95.0 95.6 98.1 96.0 95.8  PLT 198   < > 194 178 167 184 201   < > = values in this interval not displayed.   Basic Metabolic Panel: Recent Labs  Lab 05/05/19 0330 05/06/19 0342 05/08/19 0327 05/09/19 0338 05/10/19 0341  NA 140 142 138 138 139  K 3.7 3.3* 3.6 3.3* 3.5  CL 105 109 103 100 104  CO2 25 25 26 30 28   GLUCOSE 121* 112* 106* 104* 99  BUN 21 25* 17 15 13   CREATININE 0.58 0.58 0.48 0.52 0.55  CALCIUM 8.7* 8.8* 8.3* 8.4* 8.7*  MG 2.2 2.2 1.8 1.8 2.0  PHOS 2.6 1.6* 2.1* 3.3 3.0   GFR: Estimated Creatinine Clearance: 55.6 mL/min (by C-G formula based on SCr of 0.55 mg/dL). Liver Function Tests: Recent Labs  Lab 05/05/19 0330 05/06/19 0342 05/08/19 0327 05/09/19 0338 05/10/19  0341  AST 20 16 29 26 25   ALT 15 14 23 22 21   ALKPHOS 78 68 106 97 91  BILITOT 1.2 1.0 1.5* 1.1 1.4*  PROT 6.7 6.1* 5.7* 5.6* 5.5*  ALBUMIN 3.8 3.4* 3.1* 2.9* 2.9*   No results for input(s): LIPASE, AMYLASE in the last 168 hours. No results for input(s): AMMONIA in the last 168 hours. Coagulation Profile: No results for input(s): INR, PROTIME in the last 168 hours. Cardiac Enzymes: No results for input(s): CKTOTAL, CKMB, CKMBINDEX, TROPONINI in the last 168 hours. BNP (last 3 results) No results for input(s): PROBNP in the last 8760 hours. HbA1C: No results for input(s): HGBA1C in the last 72 hours. CBG: No results for input(s): GLUCAP in the last 168 hours. Lipid Profile: No results for input(s): CHOL, HDL, LDLCALC, TRIG, CHOLHDL, LDLDIRECT in the  last 72 hours. Thyroid Function Tests: No results for input(s): TSH, T4TOTAL, FREET4, T3FREE, THYROIDAB in the last 72 hours. Anemia Panel: Recent Labs    05/09/19 0805  TIBC 292  IRON 29   Sepsis Labs: No results for input(s): PROCALCITON, LATICACIDVEN in the last 168 hours.  Recent Results (from the past 240 hour(s))  Respiratory Panel by RT PCR (Flu A&B, Covid) - Nasopharyngeal Swab     Status: None   Collection Time: 05/03/19 11:30 PM   Specimen: Nasopharyngeal Swab  Result Value Ref Range Status   SARS Coronavirus 2 by RT PCR NEGATIVE NEGATIVE Final    Comment: (NOTE) SARS-CoV-2 target nucleic acids are NOT DETECTED. The SARS-CoV-2 RNA is generally detectable in upper respiratoy specimens during the acute phase of infection. The lowest concentration of SARS-CoV-2 viral copies this assay can detect is 131 copies/mL. A negative result does not preclude SARS-Cov-2 infection and should not be used as the sole basis for treatment or other patient management decisions. A negative result may occur with  improper specimen collection/handling, submission of specimen other than nasopharyngeal swab, presence of viral mutation(s) within the areas targeted by this assay, and inadequate number of viral copies (<131 copies/mL). A negative result must be combined with clinical observations, patient history, and epidemiological information. The expected result is Negative. Fact Sheet for Patients:  PinkCheek.be Fact Sheet for Healthcare Providers:  GravelBags.it This test is not yet ap proved or cleared by the [place] FDA and  has been authorized for detection and/or diagnosis of SARS-CoV-2 by FDA under an Emergency Use Authorization (EUA). This EUA will remain  in effect (meaning this test can be used) for the duration of the COVID-19 declaration under Section 564(b)(1) of the Act, 21 U.S.C. section 360bbb-3(b)(1), unless  the authorization is terminated or revoked sooner.    Influenza A by PCR NEGATIVE NEGATIVE Final   Influenza B by PCR NEGATIVE NEGATIVE Final    Comment: (NOTE) The Xpert Xpress SARS-CoV-2/FLU/RSV assay is intended as an aid in  the diagnosis of influenza from Nasopharyngeal swab specimens and  should not be used as a sole basis for treatment. Nasal washings and  aspirates are unacceptable for Xpert Xpress SARS-CoV-2/FLU/RSV  testing. Fact Sheet for Patients: PinkCheek.be Fact Sheet for Healthcare Providers: GravelBags.it This test is not yet approved or cleared by the [place] FDA and  has been authorized for detection and/or diagnosis of SARS-CoV-2 by  FDA under an Emergency Use Authorization (EUA). This EUA will remain  in effect (meaning this test can be used) for the duration of the  Covid-19 declaration under Section 564(b)(1) of the Act, 21  U.S.C. section  360bbb-3(b)(1), unless the authorization is  terminated or revoked. Performed at [facility], [place] [address], [place], [place] [address]   SARS CORONAVIRUS 2 (TAT 6-24 HRS) Nasopharyngeal Nasopharyngeal Swab     Status: None   Collection Time: 05/07/19  1:24 PM   Specimen: Nasopharyngeal Swab  Result Value Ref Range Status   SARS Coronavirus 2 NEGATIVE NEGATIVE Final    Comment: (NOTE) SARS-CoV-2 target nucleic acids are NOT DETECTED. The SARS-CoV-2 RNA is generally detectable in upper and lower respiratory specimens during the acute phase of infection. Negative results do not preclude SARS-CoV-2 infection, do not rule out co-infections with other pathogens, and should not be used as the sole basis for treatment or other patient management decisions. Negative results must be combined with clinical observations, patient history, and epidemiological information. The expected result is Negative. Fact Sheet for  Patients: SugarRoll.be Fact Sheet for Healthcare Providers: https://www.woods-mathews.com/ This test is not yet approved or cleared by the [place] FDA and  has been authorized for detection and/or diagnosis of SARS-CoV-2 by FDA under an Emergency Use Authorization (EUA). This EUA will remain  in effect (meaning this test can be used) for the duration of the COVID-19 declaration under Section 56 4(b)(1) of the Act, 21 U.S.C. section 360bbb-3(b)(1), unless the authorization is terminated or revoked sooner. Performed at [facility], [place] [address] [place], [place] [address]          Radiology Studies: DG Abd 1 View  Result Date: 05/09/2019 CLINICAL DATA:  Abdominal bloating. EXAM: ABDOMEN - 1 VIEW COMPARISON:  None. FINDINGS: The patient's left hand overlies left side of the abdomen and could not be moved due to recent shoulder surgery. There appears to be some fecal loading in the distal colon/rectum. No evidence of bowel obstruction. Calcifications in the right upper quadrant could represent renal stones. No other acute abnormalities. IMPRESSION: Fecal loading in the rectum and sigmoid colon. No bowel obstruction noted. Calcifications in the right upper quadrant may represent renal stones. Electronically Signed   By: Dorise Bullion III M.D   On: 05/09/2019 11:29   DG CHEST PORT 1 VIEW  Result Date: 05/10/2019 CLINICAL DATA:  Worsening oxygen saturation. EXAM: PORTABLE CHEST 1 VIEW COMPARISON:  05/03/2019 FINDINGS: Worsening atelectasis in both lower lobes, worse on the left than the right. There could be developing pleural effusions. Upper lungs appear clear. IMPRESSION: Worsening atelectasis in both lower lobes, left more than right. Possible developing effusions. Electronically Signed   By: Nelson Chimes M.D.   On: 05/10/2019 10:52    Scheduled Meds: . amLODipine  5 mg Oral Daily  . aspirin EC  81 mg Oral Daily  . budesonide  (PULMICORT) nebulizer solution  0.25 mg Nebulization BID  . Chlorhexidine Gluconate Cloth  6 each Topical Daily  . docusate sodium  100 mg Oral BID  . feeding supplement (ENSURE ENLIVE)  237 mL Oral TID BM  . lamoTRIgine  150 mg Oral BID  . levETIRAcetam  250 mg Oral Daily   And  . levETIRAcetam  500 mg Oral QHS  . memantine  10 mg Oral BID  . polyethylene glycol  17 g Oral Daily  . pramipexole  0.25 mg Oral Daily  . senna-docusate  1 tablet Oral BID  . simvastatin  10 mg Oral Daily  . tamsulosin  0.4 mg Oral Daily  . tiotropium  18 mcg Inhalation Daily   Continuous Infusions: . sodium chloride Stopped (05/07/19 1747)  . levofloxacin (  LEVAQUIN) IV       LOS: 6 days    Time spent: 25 mins.    Shawna Clamp, MD [facility]   If 7PM-7AM, please contact night-coverage

## 2019-05-10 NOTE — Plan of Care (Signed)

## 2019-05-10 NOTE — Progress Notes (Signed)
[name]  MRN: [id number] DOB/Age: 84/84/1934 84 y.o. Physician: Ander Slade, M.D. 4 Days Post-Op Procedure(s) (LRB): REVERSE SHOULDER ARTHROPLASTY (Left)  Subjective: Patient resting comfortably in bed this evening.  Reports minimal left shoulder pain. Vital Signs Temp:  [97.8 F (36.6 C)-98.1 F (36.7 C)] 98.1 F (36.7 C) (02/01 1009) Pulse Rate:  [95-100] 95 (02/01 1009) Resp:  [16-18] 18 (02/01 1009) BP: (117-148)/(69-91) 117/69 (02/01 1009) SpO2:  [92 %-96 %] 95 % (02/01 1009)  Lab Results Recent Labs    05/09/19 0338 05/10/19 0341  WBC 8.1 8.2  HGB 10.8* 10.9*  HCT 33.8* 33.9*  PLT 184 201   BMET Recent Labs    05/09/19 0338 05/10/19 0341  NA 138 139  K 3.3* 3.5  CL 100 104  CO2 30 28  GLUCOSE 104* 99  BUN 15 13  CREATININE 0.52 0.55  CALCIUM 8.4* 8.7*   INR  Date Value Ref Range Status  11/27/2015 0.96  Final     Exam  Inspection of the left upper extremity demonstrates moderate diffuse swelling.  Compartments are all soft.  She can perform active assisted elbow flexion to almost 140 degrees and extension to -30 degrees.  Her grip strength is limited but she is grossly neurovascular intact in the left upper extremity.  Left shoulder dressing is clean and dry.  Skin is intact.  Impression:  Status post left shoulder reverse arthroplasty for severely displaced four-part proximal humerus fracture.  Significant swelling left upper extremity as often seen following this surgery.  Compartments are all soft and patient remains neurovascular intact.  Plan I have counseled [name] regarding today's findings.  We have discussed edema reduction exercises to include fist pumps and gripping activities, wrist flexion and extension, and elbow flexion and extension.  Given her extremely friable skin I am reluctant to utilize compressive wraps.  The therapist have reviewed with her some additional edema reduction techniques which we reviewed this evening.  We  will continue to follow as an inpatient, and otherwise plan for follow-up in our office approximately 2 weeks postop. [name] 05/10/2019, 6:19 PM   Contact # [phone]

## 2019-05-10 NOTE — Plan of Care (Signed)

## 2019-05-10 NOTE — Progress Notes (Signed)
Occupational Therapy Treatment Patient Details Name: [name] MRN: [id number] DOB: [birth year] Today's Date: 05/10/2019    History of present illness 86 y o female s/p fall resulting in L displaced 4 part humerus fx and mandibular condyle fx.  S/o L RTSA   OT comments  Pt with significant edema LUE RN aware. Focused on positioning as well as allowed ROm ( hand and elbow)  Follow Up Recommendations  SNF    Equipment Recommendations  3 in 1 bedside commode    Recommendations for Other Services      Precautions / Restrictions Precautions Precautions: Fall;Shoulder Type of Shoulder Precautions: may use LUE passively during adls within the following parameters:  10 ER, 45 ABD, 60 FF.  May move elbow wrist and fingers.  May dangle during shower.  NO PENDULUMS Shoulder Interventions: Shoulder sling/immobilizer Precaution Booklet Issued: Yes (comment) Restrictions Weight Bearing Restrictions: Yes Other Position/Activity Restrictions: LUE NWB       Mobility Bed Mobility Overal bed mobility: Needs Assistance Bed Mobility: Sit to Supine       Sit to supine: Mod assist;HOB elevated;+2 for physical assistance;+2 for safety/equipment   General bed mobility comments: Cues for sequence; physical assist to manage LEs onto bed and to control trunk.hun  Transfers Overall transfer level: Needs assistance Equipment used: Rolling walker (2 wheeled) Transfers: Sit to/from Stand Sit to Stand: Mod assist Stand pivot transfers: Mod assist       General transfer comment: assist to power up and balance in intial standing    Balance Overall balance assessment: History of Falls;Needs assistance Sitting-balance support: Feet supported Sitting balance-Leahy Scale: Good     Standing balance support: Single extremity supported Standing balance-Leahy Scale: Poor                             ADL either performed or assessed with clinical judgement   ADL Overall ADL's :  Needs assistance/impaired                 Upper Body Dressing : Sitting Upper Body Dressing Details (indicate cue type and reason): sitting     Toilet Transfer: Moderate assistance;Stand-pivot   Toileting- Clothing Manipulation and Hygiene: Total assistance               Vision       Perception     Praxis      Cognition Arousal/Alertness: Awake/alert Behavior During Therapy: WFL for tasks assessed/performed                                   General Comments: per chart, mild memory difficulties        Exercises     Shoulder Instructions       General Comments      Pertinent Vitals/ Pain       Pain Score: 3  Pain Descriptors / Indicators: Aching Pain Intervention(s): Limited activity within patient's tolerance  Home Living                                          Prior Functioning/Environment              Frequency  Min 2X/week        Progress Toward Goals  OT Goals(current goals can now  be found in the care plan section)  Progress towards OT goals: Progressing toward goals     Plan Discharge plan remains appropriate    Co-evaluation                 AM-PAC OT "6 Clicks" Daily Activity     Outcome Measure   Help from another person eating meals?: A Little Help from another person taking care of personal grooming?: A Little Help from another person toileting, which includes using toliet, bedpan, or urinal?: A Lot Help from another person bathing (including washing, rinsing, drying)?: A Lot Help from another person to put on and taking off regular upper body clothing?: A Lot Help from another person to put on and taking off regular lower body clothing?: A Lot 6 Click Score: 14    End of Session    OT Visit Diagnosis: Unsteadiness on feet (R26.81);History of falling (Z91.81);Muscle weakness (generalized) (M62.81)   Activity Tolerance Patient tolerated treatment well   Patient Left in  bed;with call bell/phone within reach;with bed alarm set   Nurse Communication          Time: 1100-1130 OT Time Calculation (min): 30 min  Charges: OT General Charges $OT Visit: 1 Visit OT Treatments $Self Care/Home Management : 23-37 mins  Kari Baars, [place] [phone] Office- [place], Edwena Felty D 05/10/2019, 1:56 PM

## 2019-05-10 NOTE — Care Management Important Message (Signed)
Important Message  Patient Details IM Letter given to Velva Harman RN Case Manager to present to the Patient Name: [name] MRN: [id number] Date of Birth: 19-Sep-1932   Medicare Important Message Given:  Yes     Kerin Salen 05/10/2019, 11:08 AM

## 2019-05-11 LAB — CBC
HCT: 33.4 % — ABNORMAL LOW (ref 36.0–46.0)
Hemoglobin: 10.8 g/dL — ABNORMAL LOW (ref 12.0–15.0)
MCH: 30.9 pg (ref 26.0–34.0)
MCHC: 32.3 g/dL (ref 30.0–36.0)
MCV: 95.4 fL (ref 80.0–100.0)
Platelets: 220 10*3/uL (ref 150–400)
RBC: 3.5 MIL/uL — ABNORMAL LOW (ref 3.87–5.11)
RDW: 13.2 % (ref 11.5–15.5)
WBC: 7.6 10*3/uL (ref 4.0–10.5)
nRBC: 0 % (ref 0.0–0.2)

## 2019-05-11 NOTE — Progress Notes (Signed)
Addendum: Faculty/Clinical Instructor cosigning in agreement with [place] nursing student's note as follows:     Student nurse assessed patient with 1L oxygen at beginning of shift at 94% saturation, with movement and transfer throughout day was able to wean off and have oxygen saturation at 93% on room air. Patient had fine crackles with slight diminished breath sounds in bases bilaterally, has reported less labored breathing throughout day. Problem: Clinical Measurements: Goal: Respiratory complications will improve Outcome: Progressing  Patient reported weakness 02.01.2021 with difficulty ambulating, participated in OT and PT throughout day, ambulated in hall and student nurse assisted with ambulation.   Problem: Activity: Goal: Risk for activity intolerance will decrease 05/11/2019 1759 by May, Sarah J, Student-RN Outcome: Progressing   Patient on liquid diet, student nurse educated on importance of intake, thus patient made effort to drink at least 4 small glasses of water and consume ensure milkshakes in addition to broths and grits at breakfast and lunch meals.  Problem: Nutrition: Goal: Adequate nutrition will be maintained 05/11/2019 1759 by May, Sarah J, Student-RN Outcome: Progressing   Patient had one small bowel movement in morning followed by a large bowel movement 30 minutes after. Student nurse helped patient with peri care and fresh linens bowel movements.  Problem: Elimination: Goal: Will not experience complications related to bowel motility Outcome: Progressing  Patient utilized SCD cuffs below knees in bed, student nurse helped apply TED hose while in chair and ambulating about unit, and student nurse helped elevated heels while in bed to prevent skin breakdown.  Problem: Skin Integrity: Goal: Risk for impaired skin integrity will decrease 05/11/2019 1759 by May, Sarah J, Student-RN Outcome: Progressing

## 2019-05-11 NOTE — Progress Notes (Signed)
Occupational Therapy Treatment Patient Details Name: [name] MRN: [id number] DOB: 1932-04-10 Today's Date: 05/11/2019    History of present illness 86 y o female s/p fall resulting in L displaced 4 part humerus fx and mandibular condyle fx.  S/o L RTSA   OT comments  OT session focused on getting OOB to [facility].  Pt needed increased time and needed to sit on BSC.  Upon getting up from [facility] pt continued to have BM.  Pt left sitting on BSC with nursing student  Follow Up Recommendations  SNF    Equipment Recommendations  3 in 1 bedside commode    Recommendations for Other Services      Precautions / Restrictions Precautions Precautions: Fall;Shoulder Type of Shoulder Precautions: may use LUE passively during adls within the following parameters:  10 ER, 45 ABD, 60 FF.  May move elbow wrist and fingers.  May dangle during shower.  NO PENDULUMS Shoulder Interventions: Shoulder sling/immobilizer Restrictions Weight Bearing Restrictions: Yes LUE Weight Bearing: Non weight bearing       Mobility Bed Mobility Overal bed mobility: Needs Assistance Bed Mobility: Sit to Supine       Sit to supine: Mod assist;HOB elevated;+2 for physical assistance;+2 for safety/equipment   General bed mobility comments: assisted back to bed + 2 to position to comfort and caution to L UE  Transfers Overall transfer level: Needs assistance Equipment used: None Transfers: Sit to/from [employer] Transfers Sit to Stand: Mod assist;Max assist;+2 physical assistance;+2 safety/equipment Stand pivot transfers: Mod assist;Max assist;+2 physical assistance;+2 safety/equipment       General transfer comment: assist to power up and balance in intial standing.  Assisted to stand for gait and assisted on/off BSC.    Balance Overall balance assessment: History of Falls;Needs assistance Sitting-balance support: Feet supported Sitting balance-Leahy Scale: Good     Standing balance support: Single  extremity supported Standing balance-Leahy Scale: Poor                             ADL either performed or assessed with clinical judgement   ADL Overall ADL's : Needs assistance/impaired                         Toilet Transfer: Moderate assistance;Stand-pivot;Cueing for safety;Cueing for sequencing;+2 for safety/equipment   Toileting- Clothing Manipulation and Hygiene: +2 for safety/equipment;Maximal assistance;Sit to/from stand         General ADL Comments: Bed to [facility].  Pt with increased strength in standing this day.               Cognition Arousal/Alertness: Awake/alert Behavior During Therapy: WFL for tasks assessed/performed Overall Cognitive Status: Within Functional Limits for tasks assessed                                 General Comments: AxO x 4 [place]                   Pertinent Vitals/ Pain       Pain Assessment: Faces Pain Score: 2  Faces Pain Scale: Hurts even more Pain Location: shoulder Pain Descriptors / Indicators: Aching;Discomfort Pain Intervention(s): Monitored during session     Prior Functioning/Environment              Frequency  Min 2X/week        Progress Toward Goals  OT Goals(current goals can now be found in the care plan section)  Progress towards OT goals: Progressing toward goals     Plan Discharge plan remains appropriate       AM-PAC OT "6 Clicks" Daily Activity     Outcome Measure   Help from another person eating meals?: A Little Help from another person taking care of personal grooming?: A Little Help from another person toileting, which includes using toliet, bedpan, or urinal?: A Lot Help from another person bathing (including washing, rinsing, drying)?: A Lot Help from another person to put on and taking off regular upper body clothing?: A Lot Help from another person to put on and taking off regular lower body clothing?: A Lot 6 Click Score: 14    End of  Session    OT Visit Diagnosis: Unsteadiness on feet (R26.81);History of falling (Z91.81);Muscle weakness (generalized) (M62.81)   Activity Tolerance Patient tolerated treatment well   Patient Left with call bell/phone within reach;with bed alarm set;in chair;Other (comment)(on BSC with nursing student)   Nurse Communication          Time: 0920-1000 OT Time Calculation (min): 40 min  Charges: OT General Charges $OT Visit: 1 Visit OT Treatments $Self Care/Home Management : 38-52 mins  Kari Baars, OT Acute Rehabilitation Services [phone] Office- [phone], [place] 05/11/2019, 4:56 PM

## 2019-05-11 NOTE — Progress Notes (Signed)
PROGRESS NOTE    [name]  [name] DOB: Dec 08, 1932 DOA: 05/03/2019   PCP: Leighton Ruff, MD   Brief Narrative: [name] a 86 y.o.femalewith medical history significant ofmild memory disorder, COPD lives in assisted living comes in after mechanical fall injuring her left upper arm. Patient denies any recent illnesses. She denies any loss of consciousness. She reports left arm pain. She suffered a laceration to her chin and brow which has been repaired in emergency department. Patient found to have a left humerus fracture and a mandibular fracture both orthopedic surgery and ENT have been called who will see patient in the morning. Her humerus will likely need surgery. Patient be referred for admission for orthopedic injuries in a patient with multiple other medical problems. Her pain is much better after receiving fentanyl in the emergency department. She lives alone at her assisted living. 05/07/2019: Patient is status post revised left shoulder arthroplasty 05/06/19 by Ortho.  Sitting out in chair and eating breakfast.  Not in acute distress. Getting pain management. Seen by Ortho today.  Plan is for OT evaluation. Ortho is okay for discharge when medically stable.   05/09/2019: Patient developed acute urianary retention requiring foley catheter. Managing with flomax. Will need voiding trial and discontinuation of foley prior to discharge. 2/1: Patient complains of chest pain and cough, chest x-ray shows worsening atelectasis, consider Levaquin for possible pneumonia. She is still requiring 2 L supplemental oxygen saturation is 94%.  Assessment & Plan:   Principal Problem:   Humerus fracture Active Problems:   COPD (chronic obstructive pulmonary disease) ([place])   Essential hypertension   Fall  1.  Mechanical fall with left proximal humeral neck fracture with displacement and comminution. Ortho is actively following this patient, s/p  reverse shoulder  arthroplasty postoperative day 5 Continue with shoulder sling / Continue with pain management.  2.  Right mandibular condyle fracture.  Patient was evaluated by oral maxillofacial/plastic surgery and recommended liquid diet and no surgical intervention for mandibular fracture. Maxillofacial surgery has no plans for surgical intervention.  3.  Chronic obstructive pulmonary disease.  Not in acute exacerbation and no home oxygen use.  Continue to monitor Continue with bronchodilators as needed.  Postoperatively she is requiring 2 L supplemental oxygen saturation is 94%. We will try to taper off supplemental oxygen, continue to monitor.  Chest x-ray shows worsening atelectasis. Patient complains of chest pain cough, consider Levaquin for possible pneumonia. She appears slightly better, weaned herself off of oxygen , she is at room air saturation is 91%.  4.  Essential hypertension.  Patient is currently normotensive  Continue to monitor  5.  Seizure disorder.  Stable Start p.o. Keppra in preparation for discharge home tomorrow.    6. Hypophosphatemia Will replete with phosphate IV Continue to monitor.  7.  Acute urinary retention.  Patient with a history of bladder incontinence now presenting with acute urinary retention.  Etiology uncertain possibly opiate induced-patient receiving increasing opiates for pain due to shoulder left humeral fracture status post right shoulder arthroplasty. C/w Flomax and decrease opiates Maintain Foley catheter for 24-48 hours and remove for voiding trial. We tried voiding trial but was not successful.  We will try it again today.  DVT prophylaxis: Lovenox subcute  Code Status: DNR Family Communication: None at bedside. Disposition plan: Patient is from assisted living facility and  will be discharged to assisted facility. Barriers to discharge include acute urinary retention requiring placement of Foley catheter and respiratory distress with hypoxia  requiring 2 L O2 by nasal cannula.   Reassess with voiding trial and saturation on room air. If stable, discharge to facility  Consultants:   Orthopedics  Plastic surgery   Procedures: None  Antimicrobials:  Anti-infectives (From admission, onward)   Start     Dose/Rate Route Frequency Ordered Stop   05/10/19 1500  levofloxacin (LEVAQUIN) IVPB 750 mg     750 mg 100 mL/hr over 90 Minutes Intravenous Every 24 hours 05/10/19 1333     05/10/19 1400  levofloxacin (LEVAQUIN) IVPB 500 mg  Status:  Discontinued     500 mg 100 mL/hr over 60 Minutes Intravenous Every 24 hours 05/10/19 1234 05/10/19 1333   05/06/19 0600  ceFAZolin (ANCEF) IVPB 2g/100 mL premix     2 g 200 mL/hr over 30 Minutes Intravenous On call to O.R. 05/06/19 0331 05/06/19 1618      Subjective: Patient seen and examined at bedside.  She feels better, sitting in the chair comfortable,  denies shortness of breath. She reports cough is getting better.Chest x-ray shows worsening atelectasis.  Objective: Vitals:   05/11/19 0437 05/11/19 0744 05/11/19 1015 05/11/19 1318  BP: 120/74  (!) 128/107 102/67  Pulse: 91  98 90  Resp: 18   16  Temp: 98.2 F (36.8 C)   98.7 F (37.1 C)  TempSrc: Oral   Oral  SpO2: 99% 94% 91% 93%  Weight:      Height:        Intake/Output Summary (Last 24 hours) at 05/11/2019 1433 Last data filed at 05/11/2019 1230 Gross per 24 hour  Intake 895 ml  Output 925 ml  Net -30 ml   Filed Weights   05/03/19 2103 05/07/19 0500  Weight: 74.8 kg 81 kg    Examination:  General exam: Appears calm and comfortable , no acute distress Respiratory system: Clear to auscultation. Respiratory effort normal. Cardiovascular system: S1 & S2 heard, RRR. No JVD, murmurs, rubs, gallops or clicks. No pedal edema. Gastrointestinal system: Abdomen is nondistended, soft and nontender. No organomegaly or masses felt. Normal bowel sounds heard. Central nervous system: Alert and oriented. No focal neurological  deficits. Extremities: Left shoulder sling Skin: No rashes, lesions or ulcers Psychiatry: Judgement and insight appear normal. Mood & affect appropriate.    Data Reviewed: I have personally reviewed following labs and imaging studies  CBC: Recent Labs  Lab 05/06/19 0342 05/08/19 0327 05/09/19 0338 05/10/19 0341 05/11/19 0422  WBC 8.6 9.8 8.1 8.2 7.6  HGB 11.8* 11.4* 10.8* 10.9* 10.8*  HCT 37.1 36.0 33.8* 33.9* 33.4*  MCV 95.6 98.1 96.0 95.8 95.4  PLT 178 167 184 201 [id number]   Basic Metabolic Panel: Recent Labs  Lab 05/05/19 0330 05/06/19 0342 05/08/19 0327 05/09/19 0338 05/10/19 0341  NA 140 142 138 138 139  K 3.7 3.3* 3.6 3.3* 3.5  CL 105 109 103 100 104  CO2 25 25 26 30 28   GLUCOSE 121* 112* 106* 104* 99  BUN 21 25* 17 15 13   CREATININE 0.58 0.58 0.48 0.52 0.55  CALCIUM 8.7* 8.8* 8.3* 8.4* 8.7*  MG 2.2 2.2 1.8 1.8 2.0  PHOS 2.6 1.6* 2.1* 3.3 3.0   GFR: Estimated Creatinine Clearance: 55.6 mL/min (by C-G formula based on SCr of 0.55 mg/dL). Liver Function Tests: Recent Labs  Lab 05/05/19 0330 05/06/19 0342 05/08/19 0327 05/09/19 0338 05/10/19 0341  AST 20 16 29 26 25   ALT 15 14 23 22 21   ALKPHOS 78 68 106 97 91  BILITOT 1.2  1.0 1.5* 1.1 1.4*  PROT 6.7 6.1* 5.7* 5.6* 5.5*  ALBUMIN 3.8 3.4* 3.1* 2.9* 2.9*   No results for input(s): LIPASE, AMYLASE in the last 168 hours. No results for input(s): AMMONIA in the last 168 hours. Coagulation Profile: No results for input(s): INR, PROTIME in the last 168 hours. Cardiac Enzymes: No results for input(s): CKTOTAL, CKMB, CKMBINDEX, TROPONINI in the last 168 hours. BNP (last 3 results) No results for input(s): PROBNP in the last 8760 hours. HbA1C: No results for input(s): HGBA1C in the last 72 hours. CBG: No results for input(s): GLUCAP in the last 168 hours. Lipid Profile: No results for input(s): CHOL, HDL, LDLCALC, TRIG, CHOLHDL, LDLDIRECT in the last 72 hours. Thyroid Function Tests: No results for  input(s): TSH, T4TOTAL, FREET4, T3FREE, THYROIDAB in the last 72 hours. Anemia Panel: Recent Labs    05/09/19 0805  TIBC 292  IRON 29   Sepsis Labs: No results for input(s): PROCALCITON, LATICACIDVEN in the last 168 hours.  Recent Results (from the past 240 hour(s))  Respiratory Panel by RT PCR (Flu A&B, Covid) - Nasopharyngeal Swab     Status: None   Collection Time: 05/03/19 11:30 PM   Specimen: Nasopharyngeal Swab  Result Value Ref Range Status   SARS Coronavirus 2 by RT PCR NEGATIVE NEGATIVE Final    Comment: (NOTE) SARS-CoV-2 target nucleic acids are NOT DETECTED. The SARS-CoV-2 RNA is generally detectable in upper respiratoy specimens during the acute phase of infection. The lowest concentration of SARS-CoV-2 viral copies this assay can detect is 131 copies/mL. A negative result does not preclude SARS-Cov-2 infection and should not be used as the sole basis for treatment or other patient management decisions. A negative result may occur with  improper specimen collection/handling, submission of specimen other than nasopharyngeal swab, presence of viral mutation(s) within the areas targeted by this assay, and inadequate number of viral copies (<131 copies/mL). A negative result must be combined with clinical observations, patient history, and epidemiological information. The expected result is Negative. Fact Sheet for Patients:  PinkCheek.be Fact Sheet for Healthcare Providers:  GravelBags.it This test is not yet ap proved or cleared by the [place] FDA and  has been authorized for detection and/or diagnosis of SARS-CoV-2 by FDA under an Emergency Use Authorization (EUA). This EUA will remain  in effect (meaning this test can be used) for the duration of the COVID-19 declaration under Section 564(b)(1) of the Act, 21 U.S.C. section 360bbb-3(b)(1), unless the authorization is terminated or revoked sooner.     Influenza A by PCR NEGATIVE NEGATIVE Final   Influenza B by PCR NEGATIVE NEGATIVE Final    Comment: (NOTE) The Xpert Xpress SARS-CoV-2/FLU/RSV assay is intended as an aid in  the diagnosis of influenza from Nasopharyngeal swab specimens and  should not be used as a sole basis for treatment. Nasal washings and  aspirates are unacceptable for Xpert Xpress SARS-CoV-2/FLU/RSV  testing. Fact Sheet for Patients: PinkCheek.be Fact Sheet for Healthcare Providers: GravelBags.it This test is not yet approved or cleared by the [place] FDA and  has been authorized for detection and/or diagnosis of SARS-CoV-2 by  FDA under an Emergency Use Authorization (EUA). This EUA will remain  in effect (meaning this test can be used) for the duration of the  Covid-19 declaration under Section 564(b)(1) of the Act, 21  U.S.C. section 360bbb-3(b)(1), unless the authorization is  terminated or revoked. Performed at [facility], [place] [address], [place], [place] [address]   SARS  CORONAVIRUS 2 (TAT 6-24 HRS) Nasopharyngeal Nasopharyngeal Swab     Status: None   Collection Time: 05/07/19  1:24 PM   Specimen: Nasopharyngeal Swab  Result Value Ref Range Status   SARS Coronavirus 2 NEGATIVE NEGATIVE Final    Comment: (NOTE) SARS-CoV-2 target nucleic acids are NOT DETECTED. The SARS-CoV-2 RNA is generally detectable in upper and lower respiratory specimens during the acute phase of infection. Negative results do not preclude SARS-CoV-2 infection, do not rule out co-infections with other pathogens, and should not be used as the sole basis for treatment or other patient management decisions. Negative results must be combined with clinical observations, patient history, and epidemiological information. The expected result is Negative. Fact Sheet for Patients: SugarRoll.be Fact Sheet for Healthcare  Providers: https://www.woods-mathews.com/ This test is not yet approved or cleared by the [place] FDA and  has been authorized for detection and/or diagnosis of SARS-CoV-2 by FDA under an Emergency Use Authorization (EUA). This EUA will remain  in effect (meaning this test can be used) for the duration of the COVID-19 declaration under Section 56 4(b)(1) of the Act, 21 U.S.C. section 360bbb-3(b)(1), unless the authorization is terminated or revoked sooner. Performed at [facility], [place] [address] [place], [place] [address]       Radiology Studies: DG CHEST PORT 1 VIEW  Result Date: 05/10/2019 CLINICAL DATA:  Worsening oxygen saturation. EXAM: PORTABLE CHEST 1 VIEW COMPARISON:  05/03/2019 FINDINGS: Worsening atelectasis in both lower lobes, worse on the left than the right. There could be developing pleural effusions. Upper lungs appear clear. IMPRESSION: Worsening atelectasis in both lower lobes, left more than right. Possible developing effusions. Electronically Signed   By: Nelson Chimes M.D.   On: 05/10/2019 10:52    Scheduled Meds: . amLODipine  5 mg Oral Daily  . aspirin EC  81 mg Oral Daily  . budesonide (PULMICORT) nebulizer solution  0.25 mg Nebulization BID  . Chlorhexidine Gluconate Cloth  6 each Topical Daily  . docusate sodium  100 mg Oral BID  . feeding supplement (ENSURE ENLIVE)  237 mL Oral TID BM  . lamoTRIgine  150 mg Oral BID  . levETIRAcetam  250 mg Oral Daily   And  . levETIRAcetam  500 mg Oral QHS  . memantine  10 mg Oral BID  . polyethylene glycol  17 g Oral Daily  . pramipexole  0.25 mg Oral Daily  . senna-docusate  1 tablet Oral BID  . simvastatin  10 mg Oral Daily  . tamsulosin  0.4 mg Oral Daily  . tiotropium  18 mcg Inhalation Daily   Continuous Infusions: . sodium chloride Stopped (05/11/19 0443)  . levofloxacin (LEVAQUIN) IV 750 mg (05/10/19 1510)     LOS: 7 days    Time spent: 25 mins.    Shawna Clamp, MD [facility]   If 7PM-7AM, please contact night-coverage

## 2019-05-11 NOTE — Progress Notes (Signed)
Occupational Therapy Treatment Patient Details Name: [name] MRN: [id number] DOB: 1932-06-25 Today's Date: 05/11/2019    History of present illness 86 y o female s/p fall resulting in L displaced 4 part humerus fx and mandibular condyle fx.  S/o L RTSA   OT comments  OT session focused on edema control as well as elbow, wrist and hand ROM as well as proper positioning.  Edema is improved this day!   Follow Up Recommendations  SNF    Equipment Recommendations  3 in 1 bedside commode    Recommendations for Other Services      Precautions / Restrictions Precautions Precautions: Fall;Shoulder Type of Shoulder Precautions: may use LUE passively during adls within the following parameters:  10 ER, 45 ABD, 60 FF.  May move elbow wrist and fingers.  May dangle during shower.  NO PENDULUMS Shoulder Interventions: Shoulder sling/immobilizer Restrictions Weight Bearing Restrictions: Yes LUE Weight Bearing: Non weight bearing       Mobility Bed Mobility Overal bed mobility: Needs Assistance Bed Mobility: Sit to Supine       Sit to supine: Mod assist;HOB elevated;+2 for physical assistance;+2 for safety/equipment   General bed mobility comments: pt inchair  Transfers Overall transfer level: Needs assistance Equipment used: None Transfers: Sit to/from [employer] Sit to Stand: Mod assist;Max assist;+2 physical assistance;+2 safety/equipment Stand pivot transfers: Mod assist;Max assist;+2 physical assistance;+2 safety/equipment       General transfer comment: assist to power up and balance in intial standing.  Assisted to stand for gait and assisted on/off BSC.    Balance Overall balance assessment: History of Falls;Needs assistance Sitting-balance support: Feet supported Sitting balance-Leahy Scale: Good     Standing balance support: Single extremity supported Standing balance-Leahy Scale: Poor                             ADL either  performed or assessed with clinical judgement   ADL Overall ADL's : Needs assistance/impaired                         Toilet Transfer: Moderate assistance;Stand-pivot;Cueing for safety;Cueing for sequencing;+2 for safety/equipment   Toileting- Clothing Manipulation and Hygiene: +2 for safety/equipment;Maximal assistance;Sit to/from stand         General ADL Comments: Bed to [facility].  Pt with increased strength in standing this day.               Cognition Arousal/Alertness: Awake/alert Behavior During Therapy: WFL for tasks assessed/performed Overall Cognitive Status: Within Functional Limits for tasks assessed                                 General Comments: AxO x 4 Sharpe        Exercises     Shoulder Instructions Shoulder Instructions Donning/doffing sling/immobilizer: Moderate assistance Correct positioning of sling/immobilizer: Moderate assistance ROM for elbow, wrist and digits of operated UE: Moderate assistance Sling wearing schedule (on at all times/off for ADL's): Moderate assistance Positioning of UE while sleeping: Moderate assistance     General Comments      Pertinent Vitals/ Pain       Pain Assessment: Faces Pain Score: 3  Faces Pain Scale: Hurts even more Pain Location: shoulder Pain Descriptors / Indicators: Aching;Discomfort Pain Intervention(s): Limited activity within patient's tolerance         Frequency  Min 2X/week        Progress Toward Goals  OT Goals(current goals can now be found in the care plan section)  Progress towards OT goals: Progressing toward goals     Plan Discharge plan remains appropriate       AM-PAC OT "6 Clicks" Daily Activity     Outcome Measure   Help from another person eating meals?: A Little Help from another person taking care of personal grooming?: A Little Help from another person toileting, which includes using toliet, bedpan, or urinal?: A Lot Help from another person  bathing (including washing, rinsing, drying)?: A Lot Help from another person to put on and taking off regular upper body clothing?: A Lot Help from another person to put on and taking off regular lower body clothing?: A Lot 6 Click Score: 14    End of Session    OT Visit Diagnosis: Unsteadiness on feet (R26.81);History of falling (Z91.81);Muscle weakness (generalized) (M62.81)   Activity Tolerance Patient tolerated treatment well   Patient Left with call bell/phone within reach;with bed alarm set;in chair;Other (comment)(on BSC with nursing student)   Nurse Communication          Time: 1230-1300 OT Time Calculation (min): 30 min  Charges: OT General Charges $OT Visit: 1 Visit OT Treatments $Self Care/Home Management : 38-52 mins $Therapeutic Activity: 23-37 mins  Kari Baars, [place] [phone] Office- [place], Edwena Felty D 05/11/2019, 5:00 PM

## 2019-05-11 NOTE — Plan of Care (Cosign Needed)
Student nurse assessed patient with 1L oxygen at beginning of shift at 94% saturation, with movement and transfer throughout day was able to wean off and have oxygen saturation at 93% on room air. Patient had fine crackles with slight diminished breath sounds in bases bilaterally, has reported less labored breathing throughout day. Problem: Clinical Measurements: Goal: Respiratory complications will improve Outcome: Progressing  Patient reported weakness 02.01.2021 with difficulty ambulating, participated in OT and PT throughout day, ambulated in hall and student nurse assisted with ambulation.   Problem: Activity: Goal: Risk for activity intolerance will decrease 05/11/2019 1759 by Chen Saadeh J, Student-RN Outcome: Progressing   Patient on liquid diet, student nurse educated on importance of intake, thus patient made effort to drink at least 4 small glasses of water and consume ensure milkshakes in addition to broths and grits at breakfast and lunch meals.  Problem: Nutrition: Goal: Adequate nutrition will be maintained 05/11/2019 1759 by Dylann Gallier J, Student-RN Outcome: Progressing   Patient had one small bowel movement in morning followed by a large bowel movement 30 minutes after. Student nurse helped patient with peri care and fresh linens bowel movements.  Problem: Elimination: Goal: Will not experience complications related to bowel motility Outcome: Progressing  Patient utilized SCD cuffs below knees in bed, student nurse helped apply TED hose while in chair and ambulating about unit, and student nurse helped elevated heels while in bed to prevent skin breakdown.  Problem: Skin Integrity: Goal: Risk for impaired skin integrity will decrease 05/11/2019 1759 by Lakisa  J, Student-RN Outcome: Progressing

## 2019-05-11 NOTE — Progress Notes (Signed)
Physical Therapy Treatment Patient Details Name: [name] MRN: [id number] DOB: Sep 06, 1932 Today's Date: 05/11/2019    History of Present Illness 86 y o female s/p fall resulting in L displaced 4 part humerus fx and mandibular condyle fx.  S/o L RTSA    PT Comments    Pt was OOB in recliner.  Applied L UE sling.  L UE was fully supported on pillow while she was seated.  Assisted with amb.  General Gait Details: used rail in hallway and Therapist on left with second assist following with recliner.  "My legs feel like noodles" stated pt. General transfer comment: assist to power up and balance in intial standing.  Assisted to stand for gait and assisted on/off BSC.  Assisted back to bed.  General bed mobility comments: assisted back to bed + 2 to position to comfort and caution to L UE. Pt will need ST Rehab at SNF prior to safely D/C to home.   Follow Up Recommendations  SNF     Equipment Recommendations  None recommended by PT    Recommendations for Other Services       Precautions / Restrictions Precautions Precautions: Fall;Shoulder Type of Shoulder Precautions: may use LUE passively during adls within the following parameters:  10 ER, 45 ABD, 60 FF.  May move elbow wrist and fingers.  May dangle during shower.  NO PENDULUMS Shoulder Interventions: Shoulder sling/immobilizer Restrictions Weight Bearing Restrictions: Yes LUE Weight Bearing: Non weight bearing    Mobility  Bed Mobility Overal bed mobility: Needs Assistance Bed Mobility: Sit to Supine       Sit to supine: Mod assist;HOB elevated;+2 for physical assistance;+2 for safety/equipment   General bed mobility comments: assisted back to bed + 2 to position to comfort and caution to L UE  Transfers Overall transfer level: Needs assistance Equipment used: None Transfers: Sit to/from [employer] Transfers Sit to Stand: Mod assist;Max assist;+2 physical assistance;+2 safety/equipment Stand pivot  transfers: Mod assist;Max assist;+2 physical assistance;+2 safety/equipment       General transfer comment: assist to power up and balance in intial standing.  Assisted to stand for gait and assisted on/off BSC.  Ambulation/Gait Ambulation/Gait assistance: Mod assist;+2 safety/equipment Gait Distance (Feet): 8 Feet Assistive device: (hallway rail) Gait Pattern/deviations: Step-to pattern;Decreased step length - right;Decreased step length - left;Shuffle;Trunk flexed Gait velocity: decreased   General Gait Details: used rail in hallway and Therapist on left with second assist following with recliner.  "My legs feel like noodles" stated pt.   Stairs             Wheelchair Mobility    Modified Rankin (Stroke Patients Only)       Balance                                            Cognition Arousal/Alertness: Awake/alert Behavior During Therapy: WFL for tasks assessed/performed Overall Cognitive Status: Within Functional Limits for tasks assessed                                 General Comments: AxO x 4 Sharpe      Exercises      General Comments        Pertinent Vitals/Pain Pain Assessment: Faces Faces Pain Scale: Hurts even more Pain Location: shoulder Pain Descriptors / Indicators:  Aching;Discomfort Pain Intervention(s): Monitored during session    Home Living                      Prior Function            PT Goals (current goals can now be found in the care plan section) Progress towards PT goals: Progressing toward goals    Frequency    Min 3X/week      PT Plan Current plan remains appropriate    Co-evaluation              AM-PAC PT "6 Clicks" Mobility   Outcome Measure  Help needed turning from your back to your side while in a flat bed without using bedrails?: A Lot Help needed moving from lying on your back to sitting on the side of a flat bed without using bedrails?: A Lot Help  needed moving to and from a bed to a chair (including a wheelchair)?: A Lot Help needed standing up from a chair using your arms (e.g., wheelchair or bedside chair)?: A Lot Help needed to walk in hospital room?: A Lot   6 Click Score: 10    End of Session Equipment Utilized During Treatment: Gait belt Activity Tolerance: Patient limited by fatigue;Patient limited by pain Patient left: in bed;with call bell/phone within reach;with nursing/sitter in room Nurse Communication: Mobility status PT Visit Diagnosis: Unsteadiness on feet (R26.81);Muscle weakness (generalized) (M62.81);Difficulty in walking, not elsewhere classified (R26.2);History of falling (Z91.81)     Time: 1445-1520 PT Time Calculation (min) (ACUTE ONLY): 35 min  Charges:  $Gait Training: 8-22 mins $Therapeutic Activity: 8-22 mins                     Rica Koyanagi  PTA Acute  Rehabilitation Services Pager      [phone] Office      [phone]

## 2019-05-12 LAB — BASIC METABOLIC PANEL
Anion gap: 10 (ref 5–15)
BUN: 19 mg/dL (ref 8–23)
CO2: 25 mmol/L (ref 22–32)
Calcium: 8.7 mg/dL — ABNORMAL LOW (ref 8.9–10.3)
Chloride: 104 mmol/L (ref 98–111)
Creatinine, Ser: 0.62 mg/dL (ref 0.44–1.00)
GFR calc Af Amer: >60 mL/min (ref >60)
GFR calc non Af Amer: >60 mL/min (ref >60)
Glucose, Bld: 97 mg/dL (ref 70–99)
Potassium: 3.3 mmol/L — ABNORMAL LOW (ref 3.5–5.1)
Sodium: 139 mmol/L (ref 135–145)

## 2019-05-12 LAB — CBC
HCT: 33.6 % — ABNORMAL LOW (ref 36.0–46.0)
Hemoglobin: 10.8 g/dL — ABNORMAL LOW (ref 12.0–15.0)
MCH: 30.6 pg (ref 26.0–34.0)
MCHC: 32.1 g/dL (ref 30.0–36.0)
MCV: 95.2 fL (ref 80.0–100.0)
Platelets: 247 10*3/uL (ref 150–400)
RBC: 3.53 MIL/uL — ABNORMAL LOW (ref 3.87–5.11)
RDW: 13.4 % (ref 11.5–15.5)
WBC: 7.4 10*3/uL (ref 4.0–10.5)
nRBC: 0 % (ref 0.0–0.2)

## 2019-05-12 LAB — SARS CORONAVIRUS 2 (TAT 6-24 HRS): SARS Coronavirus 2: NEGATIVE

## 2019-05-12 MED ORDER — POTASSIUM CHLORIDE 20 MEQ PO PACK
40.0000 meq | PACK | Freq: Once | ORAL | Status: AC
  Administered 2019-05-12: 40 meq via ORAL
  Filled 2019-05-12: qty 2

## 2019-05-12 NOTE — Progress Notes (Signed)
PROGRESS NOTE    [name]  [name] DOB: 06/05/1932 DOA: 05/03/2019   PCP: Leighton Ruff, MD   Brief Narrative: [name] a 86 y.o.femalewith medical history significant ofmild memory disorder, COPD lives in assisted living comes in after mechanical fall injuring her left upper arm. Patient denies any recent illnesses. She denies any loss of consciousness. She reports left arm pain. She suffered a laceration to her chin and brow which has been repaired in emergency department. Patient found to have a left humerus fracture and a mandibular fracture both orthopedic surgery and ENT have been called who will see patient in the morning. Her humerus will likely need surgery. Patient be referred for admission for orthopedic injuries in a patient with multiple other medical problems. Her pain is much better after receiving fentanyl in the emergency department. She lives alone at her assisted living. 05/07/2019: Patient is status post revised left shoulder arthroplasty 05/06/19 by Ortho.  Sitting out in chair and eating breakfast.  Not in acute distress. Getting pain management. Seen by Ortho today.  Plan is for OT evaluation. Ortho is okay for discharge when medically stable.   05/09/2019: Patient developed acute urianary retention requiring foley catheter. Managing with flomax. Will need voiding trial and discontinuation of foley prior to discharge. 2/1: Patient complains of chest pain and cough, chest x-ray shows worsening atelectasis, consider Levaquin for possible pneumonia. She is still requiring 2 L supplemental oxygen saturation is 94%. 2/2: Unsuccessful voiding trial, continues to have hypoxia requiring oxygen, probably due to new pneumonia.  Assessment & Plan:   Principal Problem:   Humerus fracture Active Problems:   COPD (chronic obstructive pulmonary disease) ([place])   Essential hypertension   Fall  1.  Mechanical fall with left proximal humeral neck  fracture with displacement and comminution. Ortho is actively following this patient, s/p  reverse shoulder arthroplasty postoperative day 5 Continue with shoulder sling / Continue with pain management.  2.  Right mandibular condyle fracture.  Patient was evaluated by oral maxillofacial/plastic surgery and recommended liquid diet and no surgical intervention for mandibular fracture. Maxillofacial surgery has no plans for surgical intervention.  3.  Chronic obstructive pulmonary disease.  Not in acute exacerbation and no home oxygen use.  Continue to monitor Continue with bronchodilators as needed.  Postoperatively she is requiring 2 L supplemental oxygen saturation is 94%. We will try to taper off supplemental oxygen, continue to monitor.  Chest x-ray shows worsening atelectasis. Patient complains of chest pain cough, consider Levaquin for possible pneumonia. She appears slightly better, weaned herself off of oxygen , she is at room air saturation is 91%. She desats again requiring 2 L supplemental oxygen,   4.  Essential hypertension.  Patient is currently normotensive  Continue to monitor  5.  Seizure disorder.  Stable Continue Keppra   6. Hypophosphatemia Will replete with phosphate IV Continue to monitor.  7.  Acute urinary retention.  Patient with a history of bladder incontinence now presenting with acute urinary retention.  Etiology uncertain possibly opiate induced-patient receiving increasing opiates for pain due to shoulder left humeral fracture status post right shoulder arthroplasty. C/w Flomax and decrease opiates Maintain Foley catheter for 24-48 hours and remove for voiding trial. We tried voiding trial but was not successful.  We will try it again today.  DVT prophylaxis: Lovenox subcute  Code Status: DNR Family Communication: None at bedside. Disposition plan: Patient is from assisted living facility and  will be discharged to assisted facility. Barriers  to  discharge include acute urinary retention requiring placement of Foley catheter and respiratory distress with hypoxia requiring 2 L O2 by nasal cannula.   Reassess with voiding trial and saturation on room air. If stable, discharge to facility  Consultants:   Orthopedics  Plastic surgery   Procedures: None  Antimicrobials:  Anti-infectives (From admission, onward)   Start     Dose/Rate Route Frequency Ordered Stop   05/10/19 1500  levofloxacin (LEVAQUIN) IVPB 750 mg     750 mg 100 mL/hr over 90 Minutes Intravenous Every 24 hours 05/10/19 1333     05/10/19 1400  levofloxacin (LEVAQUIN) IVPB 500 mg  Status:  Discontinued     500 mg 100 mL/hr over 60 Minutes Intravenous Every 24 hours 05/10/19 1234 05/10/19 1333   05/06/19 0600  ceFAZolin (ANCEF) IVPB 2g/100 mL premix     2 g 200 mL/hr over 30 Minutes Intravenous On call to O.R. 05/06/19 0331 05/06/19 1618      Subjective: Patient seen and examined at bedside.  She reports not feeling well, c/o worsening shoulder pain.  She reports cough is getting better. Chest x-ray shows worsening atelectasis.  Objective: Vitals:   05/12/19 0531 05/12/19 0737 05/12/19 0744 05/12/19 1332  BP: (!) 145/76   130/72  Pulse: 94   89  Resp: 20     Temp: 97.9 F (36.6 C)   98.1 F (36.7 C)  TempSrc: Oral   Oral  SpO2: (!) 89% (!) 89% 93% 95%  Weight:      Height:        Intake/Output Summary (Last 24 hours) at 05/12/2019 1347 Last data filed at 05/12/2019 1100 Gross per 24 hour  Intake 780 ml  Output 1350 ml  Net -570 ml   Filed Weights   05/03/19 2103 05/07/19 0500  Weight: 74.8 kg 81 kg    Examination:  General exam: Appears calm and comfortable , no acute distress Respiratory system: Clear to auscultation. Respiratory effort normal. Cardiovascular system: S1 & S2 heard, RRR. No JVD, murmurs, rubs, gallops or clicks. No pedal edema. Gastrointestinal system: Abdomen is nondistended, soft and nontender. No organomegaly or masses  felt. Normal bowel sounds heard. Central nervous system: Alert and oriented. No focal neurological deficits. Extremities: Left shoulder sling Skin: No rashes, lesions or ulcers Psychiatry: Judgement and insight appear normal. Mood & affect appropriate.    Data Reviewed: I have personally reviewed following labs and imaging studies  CBC: Recent Labs  Lab 05/08/19 0327 05/09/19 0338 05/10/19 0341 05/11/19 0422 05/12/19 0334  WBC 9.8 8.1 8.2 7.6 7.4  HGB 11.4* 10.8* 10.9* 10.8* 10.8*  HCT 36.0 33.8* 33.9* 33.4* 33.6*  MCV 98.1 96.0 95.8 95.4 95.2  PLT 167 184 201 220 [id number]   Basic Metabolic Panel: Recent Labs  Lab 05/06/19 0342 05/08/19 0327 05/09/19 0338 05/10/19 0341 05/12/19 0334  NA 142 138 138 139 139  K 3.3* 3.6 3.3* 3.5 3.3*  CL 109 103 100 104 104  CO2 25 26 30 28 25   GLUCOSE 112* 106* 104* 99 97  BUN 25* 17 15 13 19   CREATININE 0.58 0.48 0.52 0.55 0.62  CALCIUM 8.8* 8.3* 8.4* 8.7* 8.7*  MG 2.2 1.8 1.8 2.0  --   PHOS 1.6* 2.1* 3.3 3.0  --    GFR: Estimated Creatinine Clearance: 55.6 mL/min (by C-G formula based on SCr of 0.62 mg/dL). Liver Function Tests: Recent Labs  Lab 05/06/19 0342 05/08/19 0327 05/09/19 0338 05/10/19 0341  AST 16 29 26  25  ALT 14 23 22 21   ALKPHOS 68 106 97 91  BILITOT 1.0 1.5* 1.1 1.4*  PROT 6.1* 5.7* 5.6* 5.5*  ALBUMIN 3.4* 3.1* 2.9* 2.9*   No results for input(s): LIPASE, AMYLASE in the last 168 hours. No results for input(s): AMMONIA in the last 168 hours. Coagulation Profile: No results for input(s): INR, PROTIME in the last 168 hours. Cardiac Enzymes: No results for input(s): CKTOTAL, CKMB, CKMBINDEX, TROPONINI in the last 168 hours. BNP (last 3 results) No results for input(s): PROBNP in the last 8760 hours. HbA1C: No results for input(s): HGBA1C in the last 72 hours. CBG: No results for input(s): GLUCAP in the last 168 hours. Lipid Profile: No results for input(s): CHOL, HDL, LDLCALC, TRIG, CHOLHDL, LDLDIRECT in  the last 72 hours. Thyroid Function Tests: No results for input(s): TSH, T4TOTAL, FREET4, T3FREE, THYROIDAB in the last 72 hours. Anemia Panel: No results for input(s): VITAMINB12, FOLATE, FERRITIN, TIBC, IRON, RETICCTPCT in the last 72 hours. Sepsis Labs: No results for input(s): PROCALCITON, LATICACIDVEN in the last 168 hours.  Recent Results (from the past 240 hour(s))  Respiratory Panel by RT PCR (Flu A&B, Covid) - Nasopharyngeal Swab     Status: None   Collection Time: 05/03/19 11:30 PM   Specimen: Nasopharyngeal Swab  Result Value Ref Range Status   SARS Coronavirus 2 by RT PCR NEGATIVE NEGATIVE Final    Comment: (NOTE) SARS-CoV-2 target nucleic acids are NOT DETECTED. The SARS-CoV-2 RNA is generally detectable in upper respiratoy specimens during the acute phase of infection. The lowest concentration of SARS-CoV-2 viral copies this assay can detect is 131 copies/mL. A negative result does not preclude SARS-Cov-2 infection and should not be used as the sole basis for treatment or other patient management decisions. A negative result may occur with  improper specimen collection/handling, submission of specimen other than nasopharyngeal swab, presence of viral mutation(s) within the areas targeted by this assay, and inadequate number of viral copies (<131 copies/mL). A negative result must be combined with clinical observations, patient history, and epidemiological information. The expected result is Negative. Fact Sheet for Patients:  PinkCheek.be Fact Sheet for Healthcare Providers:  GravelBags.it This test is not yet ap proved or cleared by the [place] FDA and  has been authorized for detection and/or diagnosis of SARS-CoV-2 by FDA under an Emergency Use Authorization (EUA). This EUA will remain  in effect (meaning this test can be used) for the duration of the COVID-19 declaration under Section 564(b)(1) of the  Act, 21 U.S.C. section 360bbb-3(b)(1), unless the authorization is terminated or revoked sooner.    Influenza A by PCR NEGATIVE NEGATIVE Final   Influenza B by PCR NEGATIVE NEGATIVE Final    Comment: (NOTE) The Xpert Xpress SARS-CoV-2/FLU/RSV assay is intended as an aid in  the diagnosis of influenza from Nasopharyngeal swab specimens and  should not be used as a sole basis for treatment. Nasal washings and  aspirates are unacceptable for Xpert Xpress SARS-CoV-2/FLU/RSV  testing. Fact Sheet for Patients: PinkCheek.be Fact Sheet for Healthcare Providers: GravelBags.it This test is not yet approved or cleared by the [place] FDA and  has been authorized for detection and/or diagnosis of SARS-CoV-2 by  FDA under an Emergency Use Authorization (EUA). This EUA will remain  in effect (meaning this test can be used) for the duration of the  Covid-19 declaration under Section 564(b)(1) of the Act, 21  U.S.C. section 360bbb-3(b)(1), unless the authorization is  terminated or revoked. Performed at [facility]  [place] [address] [place], [place] [address]   SARS CORONAVIRUS 2 (TAT 6-24 HRS) Nasopharyngeal Nasopharyngeal Swab     Status: None   Collection Time: 05/07/19  1:24 PM   Specimen: Nasopharyngeal Swab  Result Value Ref Range Status   SARS Coronavirus 2 NEGATIVE NEGATIVE Final    Comment: (NOTE) SARS-CoV-2 target nucleic acids are NOT DETECTED. The SARS-CoV-2 RNA is generally detectable in upper and lower respiratory specimens during the acute phase of infection. Negative results do not preclude SARS-CoV-2 infection, do not rule out co-infections with other pathogens, and should not be used as the sole basis for treatment or other patient management decisions. Negative results must be combined with clinical observations, patient history, and epidemiological information. The expected result is  Negative. Fact Sheet for Patients: SugarRoll.be Fact Sheet for Healthcare Providers: https://www.woods-mathews.com/ This test is not yet approved or cleared by the [place] FDA and  has been authorized for detection and/or diagnosis of SARS-CoV-2 by FDA under an Emergency Use Authorization (EUA). This EUA will remain  in effect (meaning this test can be used) for the duration of the COVID-19 declaration under Section 56 4(b)(1) of the Act, 21 U.S.C. section 360bbb-3(b)(1), unless the authorization is terminated or revoked sooner. Performed at [facility], [place] [address], [place], [place] [address]   SARS CORONAVIRUS 2 (TAT 6-24 HRS) Nasopharyngeal Nasopharyngeal Swab     Status: None   Collection Time: 05/11/19  7:50 PM   Specimen: Nasopharyngeal Swab  Result Value Ref Range Status   SARS Coronavirus 2 NEGATIVE NEGATIVE Final    Comment: (NOTE) SARS-CoV-2 target nucleic acids are NOT DETECTED. The SARS-CoV-2 RNA is generally detectable in upper and lower respiratory specimens during the acute phase of infection. Negative results do not preclude SARS-CoV-2 infection, do not rule out co-infections with other pathogens, and should not be used as the sole basis for treatment or other patient management decisions. Negative results must be combined with clinical observations, patient history, and epidemiological information. The expected result is Negative. Fact Sheet for Patients: SugarRoll.be Fact Sheet for Healthcare Providers: https://www.woods-mathews.com/ This test is not yet approved or cleared by the [place] FDA and  has been authorized for detection and/or diagnosis of SARS-CoV-2 by FDA under an Emergency Use Authorization (EUA). This EUA will remain  in effect (meaning this test can be used) for the duration of the COVID-19 declaration under Section 56 4(b)(1) of the Act, 21  U.S.C. section 360bbb-3(b)(1), unless the authorization is terminated or revoked sooner. Performed at [facility], [place] [address] [place], [place] [address]       Radiology Studies: No results found.  Scheduled Meds: . amLODipine  5 mg Oral Daily  . aspirin EC  81 mg Oral Daily  . budesonide (PULMICORT) nebulizer solution  0.25 mg Nebulization BID  . Chlorhexidine Gluconate Cloth  6 each Topical Daily  . docusate sodium  100 mg Oral BID  . feeding supplement (ENSURE ENLIVE)  237 mL Oral TID BM  . lamoTRIgine  150 mg Oral BID  . levETIRAcetam  250 mg Oral Daily   And  . levETIRAcetam  500 mg Oral QHS  . memantine  10 mg Oral BID  . polyethylene glycol  17 g Oral Daily  . pramipexole  0.25 mg Oral Daily  . senna-docusate  1 tablet Oral BID  . simvastatin  10 mg Oral Daily  . tamsulosin  0.4 mg Oral Daily  . tiotropium  18 mcg Inhalation Daily   Continuous Infusions: . sodium chloride Stopped (05/11/19 0443)  . levofloxacin (LEVAQUIN) IV Stopped (05/12/19 0700)     LOS: 8 days    Time spent: 25 mins.    Shawna Clamp, MD [facility]   If 7PM-7AM, please contact night-coverage

## 2019-05-12 NOTE — Progress Notes (Signed)
Patient urinated and had a bowel movement.

## 2019-05-12 NOTE — Progress Notes (Signed)
Physical Therapy Treatment Patient Details Name: [name] MRN: [id number] DOB: 1933-02-09 Today's Date: 05/12/2019    History of Present Illness 86 y o female s/p fall resulting in L displaced 4 part humerus fx and mandibular condyle fx.  S/o L RTSA    PT Comments    Patient is progressing gradually with therapy and increased distance to ~20' today. She was educated on use of hemi-cane for gait and required VC's and assist for positioning but improved after she past the obstacel for the doorway. She was educated on retrograde massage technique for Lt hand edema. She will continue to benefit from skilled PT interventions at below venue to progress independence with functional mobility. Acute PT will continue to progress as able.   Follow Up Recommendations  SNF     Equipment Recommendations  None recommended by PT    Recommendations for Other Services       Precautions / Restrictions Precautions Precautions: Fall;Shoulder Type of Shoulder Precautions: may use LUE passively during adls within the following parameters:  10 ER, 45 ABD, 60 FF.  May move elbow wrist and fingers.  May dangle during shower.  NO PENDULUMS Shoulder Interventions: Shoulder sling/immobilizer Precaution Booklet Issued: Yes (comment) Restrictions Weight Bearing Restrictions: Yes LUE Weight Bearing: Non weight bearing Other Position/Activity Restrictions: LUE NWB    Mobility  Bed Mobility Overal bed mobility: Needs Assistance Bed Mobility: Supine to Sit     Supine to sit: HOB elevated;Mod assist;+2 for physical assistance;Max assist Sit to supine: Min assist;Mod assist;HOB elevated   General bed mobility comments: Mod +2 with cues and assist for walking bil LE's to EOB, mod-max assist to raise trunk upright and to scoot forward to place feet on floor  Transfers Overall transfer level: Needs assistance Equipment used: Hemi-walker Transfers: Sit to/from Stand Sit to Stand: Mod assist;From  elevated surface;Min assist         General transfer comment: min-mod assist for sit<>stand 2x from EOB with use of hemiwalker. pt on 1L/min and SpO2 around 90%, increased to 2L/min for gait.  Ambulation/Gait Ambulation/Gait assistance: Mod assist;+2 safety/equipment;Min assist Gait Distance (Feet): 20 Feet Assistive device: Hemi-walker Gait Pattern/deviations: Step-to pattern;Decreased step length - right;Decreased step length - left;Shuffle;Trunk flexed;Decreased stride length Gait velocity: decreased   General Gait Details: verbal cues and assist for positioning of hemi-walker for gait, pt wtih tendency to step  into walker and required cues to keep it to her side. pt SpO2 remained at ~ 94-95% on 2L/min with gait. Pt limited by fatigue and LE weakness. (chair follow with recliner for safety)   Stairs             Wheelchair Mobility    Modified Rankin (Stroke Patients Only)       Balance Overall balance assessment: History of Falls;Needs assistance Sitting-balance support: Feet supported Sitting balance-Leahy Scale: Good     Standing balance support: Single extremity supported;During functional activity Standing balance-Leahy Scale: Poor              Cognition Arousal/Alertness: Awake/alert Behavior During Therapy: WFL for tasks assessed/performed Overall Cognitive Status: Within Functional Limits for tasks assessed              Exercises Other Exercises Other Exercises: performed 10 reps finger, wrist, sup/pron, elbow sitting EOB.  Performed retrograde massage and positioning for edema. Will issue squeeze ball today Other Exercises: educated on edema retrograde massage to Lt hand and for gentle/light pressure as well as to start at the base  of her fingers and work up towards her wrist    General Comments        Pertinent Vitals/Pain Pain Assessment: Faces Faces Pain Scale: Hurts a little bit Pain Location: shoulder Pain Descriptors / Indicators:  Aching;Discomfort Pain Intervention(s): Limited activity within patient's tolerance;Monitored during session;Repositioned            PT Goals (current goals can now be found in the care plan section) Acute Rehab PT Goals Patient Stated Goal: return to independence PT Goal Formulation: With patient Time For Goal Achievement: 05/14/19 Potential to Achieve Goals: Fair Progress towards PT goals: Progressing toward goals    Frequency    Min 3X/week      PT Plan Current plan remains appropriate        AM-PAC PT "6 Clicks" Mobility   Outcome Measure  Help needed turning from your back to your side while in a flat bed without using bedrails?: A Lot Help needed moving from lying on your back to sitting on the side of a flat bed without using bedrails?: A Lot Help needed moving to and from a bed to a chair (including a wheelchair)?: A Lot Help needed standing up from a chair using your arms (e.g., wheelchair or bedside chair)?: A Lot Help needed to walk in hospital room?: A Lot Help needed climbing 3-5 steps with a railing? : Total 6 Click Score: 11    End of Session Equipment Utilized During Treatment: Gait belt Activity Tolerance: Patient limited by fatigue;Patient limited by pain Patient left: in bed;with call bell/phone within reach;with nursing/sitter in room Nurse Communication: Mobility status PT Visit Diagnosis: Unsteadiness on feet (R26.81);Muscle weakness (generalized) (M62.81);Difficulty in walking, not elsewhere classified (R26.2);History of falling (Z91.81)     Time: [id number] PT Time Calculation (min) (ACUTE ONLY): 29 min  Charges:  $Gait Training: 8-22 mins $Therapeutic Activity: 8-22 mins                     Verner Mould, DPT Physical Therapist with [facility] [phone]  05/12/2019 12:45 PM

## 2019-05-12 NOTE — Progress Notes (Signed)
Occupational Therapy Treatment Patient Details Name: [name] MRN: [id number] DOB: [birth year] Today's Date: 05/12/2019    History of present illness 86 y o female s/p fall resulting in L displaced 4 part humerus fx and mandibular condyle fx.  S/o L RTSA   OT comments  Pt with edema throughout LUE. Worked on sitting EOB, exercises, edema management education and ADL education following protocol  Follow Up Recommendations  SNF    Equipment Recommendations  3 in 1 bedside commode    Recommendations for Other Services      Precautions / Restrictions Precautions Precautions: Fall;Shoulder Type of Shoulder Precautions: may use LUE passively during adls within the following parameters:  10 ER, 45 ABD, 60 FF.  May move elbow wrist and fingers.  May dangle during shower.  NO PENDULUMS Shoulder Interventions: Shoulder sling/immobilizer Precaution Booklet Issued: Yes (comment) Restrictions LUE Weight Bearing: Non weight bearing       Mobility Bed Mobility           Sit to supine: Min assist;Mod assist;HOB elevated   General bed mobility comments: max +2 for back to bed  Transfers                      Balance     Sitting balance-Leahy Scale: Good(limited by back pain)                                     ADL either performed or assessed with clinical judgement   ADL                                         General ADL Comments: sat EOB for distal exercises.  Educated on passive use during adls and moving hand wrist and elbow for edema management.  Performed retrograde massage and positioned hand higher than elbow in sling in bed     Vision       Perception     Praxis      Cognition Arousal/Alertness: Awake/alert Behavior During Therapy: WFL for tasks assessed/performed Overall Cognitive Status: Within Functional Limits for tasks assessed                                          Exercises  Exercises: Other exercises Other Exercises Other Exercises: performed 10 reps finger, wrist, sup/pron, elbow sitting EOB.  Performed retrograde massage and positioning for edema. Will issue squeeze ball today   Shoulder Instructions       General Comments      Pertinent Vitals/ Pain       Pain Assessment: Faces Faces Pain Scale: Hurts a little bit Pain Location: shoulder Pain Descriptors / Indicators: Aching;Discomfort Pain Intervention(s): Limited activity within patient's tolerance;Monitored during session;Premedicated before session;Repositioned  Home Living                                          Prior Functioning/Environment              Frequency  Min 2X/week        Progress Toward Goals  OT Goals(current goals can now  be found in the care plan section)  Progress towards OT goals: Progressing toward goals     Plan      Co-evaluation                 AM-PAC OT "6 Clicks" Daily Activity     Outcome Measure   Help from another person eating meals?: A Little Help from another person taking care of personal grooming?: A Little Help from another person toileting, which includes using toliet, bedpan, or urinal?: A Lot Help from another person bathing (including washing, rinsing, drying)?: A Lot Help from another person to put on and taking off regular upper body clothing?: A Lot   6 Click Score: 12    End of Session    OT Visit Diagnosis: Unsteadiness on feet (R26.81);History of falling (Z91.81);Muscle weakness (generalized) (M62.81)   Activity Tolerance Patient tolerated treatment well   Patient Left with call bell/phone within reach;with bed alarm set;in chair;Other (comment)   Nurse Communication          [id number] OT Time Calculation (min): 29 min  Charges: OT General Charges $OT Visit: 1 Visit OT Treatments $Therapeutic Activity: 23-37 mins  [name] S, OTR/L Acute Rehabilitation  Services 05/12/2019   [name] 05/12/2019, 10:39 AM

## 2019-05-13 LAB — CBC
HCT: 36.2 % (ref 36.0–46.0)
Hemoglobin: 11.3 g/dL — ABNORMAL LOW (ref 12.0–15.0)
MCH: 30.3 pg (ref 26.0–34.0)
MCHC: 31.2 g/dL (ref 30.0–36.0)
MCV: 97.1 fL (ref 80.0–100.0)
Platelets: 275 10*3/uL (ref 150–400)
RBC: 3.73 MIL/uL — ABNORMAL LOW (ref 3.87–5.11)
RDW: 13.4 % (ref 11.5–15.5)
WBC: 8.3 10*3/uL (ref 4.0–10.5)
nRBC: 0 % (ref 0.0–0.2)

## 2019-05-13 LAB — BASIC METABOLIC PANEL
Anion gap: 9 (ref 5–15)
BUN: 16 mg/dL (ref 8–23)
CO2: 24 mmol/L (ref 22–32)
Calcium: 8.4 mg/dL — ABNORMAL LOW (ref 8.9–10.3)
Chloride: 105 mmol/L (ref 98–111)
Creatinine, Ser: 0.6 mg/dL (ref 0.44–1.00)
GFR calc Af Amer: >60 mL/min (ref >60)
GFR calc non Af Amer: >60 mL/min (ref >60)
Glucose, Bld: 101 mg/dL — ABNORMAL HIGH (ref 70–99)
Potassium: 3.6 mmol/L (ref 3.5–5.1)
Sodium: 138 mmol/L (ref 135–145)

## 2019-05-13 MED ORDER — TAMSULOSIN HCL 0.4 MG PO CAPS: 0.4000 mg | ORAL_CAPSULE | Freq: Every day | ORAL | 0 refills | Status: DC

## 2019-05-13 MED ORDER — CEFDINIR 300 MG PO CAPS: 300.0000 mg | ORAL_CAPSULE | Freq: Two times a day (BID) | ORAL | 0 refills | Status: DC

## 2019-05-13 MED ORDER — AMLODIPINE BESYLATE 5 MG PO TABS: 5.0000 mg | ORAL_TABLET | Freq: Every day | ORAL | 0 refills | Status: DC

## 2019-05-13 NOTE — Discharge Summary (Signed)
Physician Discharge Summary  [name] [name] DOB: 02/18/1933 DOA: 05/03/2019  PCP: Leighton Ruff, MD  Admit date: 05/03/2019   Discharge date: 05/13/2019  Admitted From: Assisted Living   Disposition:  SNF  [name]  Recommendations for Outpatient Follow-up:  1. Follow up with PCP in 1-2 weeks 2. Please obtain BMP/CBC in one week 3. Follow-up with orthopedics in 2 weeks postoperatively. 4. Patient is being discharged on [place] for 4 more days to complete 7-day course for pneumonia. 5. Patient is being tramadol as needed for pain.  Home Health : No  Equipment/Devices: No  Discharge Condition: Stable  CODE STATUS:DNR Diet recommendation: Heart Healthy / Carb Modified / Regular / Dysphagia   Brief Summary / Hospital course: [name] an 86 y.o.femalewith medical history significant ofmild memory disorder, COPD lives in an assisted living comes in after mechanical fall injuring her left upper arm. Patient denies any recent illnesses. She denies any loss of consciousness. She reports left arm pain. She suffered a laceration to her chin and brow which has been repaired in emergency department. Patient found to have a left humerus fracture and a mandibular fracture both orthopedic surgery and ENT have been consulted, Her pain was much better after receiving fentanyl in the emergency department. She lives alone at her assisted living. Patient underwent revised left shoulder arthroplasty on January 28 by Ortho.  Tolerated well without any complications.  Pain was better controlled with as needed pain medications.  PT suggested a skilled nursing facility for rehabilitation. Patient developed acute urinary retention requiring foley catheter postoperatively. Managing with flomax.  Voiding trial was attempted several times but was unsuccessful,  eventually she voided on day of discharge.  She also developed chest pain, cough and shortness of breath and  found to have new infiltrate on chest x-ray,  started on Levaquin, she felt much better,  she voided and chest pain resolved.  She is being discharged to skilled nursing facility for rehabilitation.  She is being discharged on Omnicef twice a day for 4 more days to complete 7-day course for community acquired pneumonia.  Managed for below problems  Discharge Diagnoses:  Principal Problem:   Humerus fracture Active Problems:   COPD (chronic obstructive pulmonary disease) ([place])   Essential hypertension   Fall  1. Mechanical fall with left proximal humeral neck fracture with displacement and comminution. Ortho is actively following this patient, s/p  reverse shoulder arthroplasty postoperative day 7 Continue with shoulder sling / Continue with pain management.  2. Right mandibular condyle fracture. Patient was evaluated by oral maxillofacial/plastic surgery and recommended liquid diet and no surgical intervention for mandibular fracture. Maxillofacial surgery has no plans for surgical intervention.  3. Chronic obstructive pulmonary disease. Not in acute exacerbation and no home oxygen use. Continue to monitor Continue with bronchodilators as needed.  Postoperatively she is requiring 2 L supplemental oxygen saturation is 94%. We will try to taper off supplemental oxygen, continue to monitor.  Chest x-ray shows worsening atelectasis. Patient complains of chest pain cough, consider Levaquin for possible pneumonia. She appears slightly better, weaned herself off of oxygen , she is at room air saturation is 91%. She weaned herself off from oxygen.   4. Essential hypertension. Patient is currently normotensive  Continue to monitor  5. Seizure disorder. Stable Continue Keppra   6. Hypophosphatemia Will replete with phosphate IV Continue to monitor.  7. Acute urinary retention. Patient with a history of bladder incontinence now presenting with acute urinary retention.  Etiology uncertain possibly opiate induced-patient receiving increasing opiates for pain due to shoulder left humeral fracture status post right shoulder arthroplasty. C/wFlomax and decrease opiates She voided successfully.  Foley catheter removed.  Discharge Instructions  Discharge Instructions    Call MD for:  difficulty breathing, headache or visual disturbances   Complete by: As directed    Call MD for:  persistant nausea and vomiting   Complete by: As directed    Call MD for:  severe uncontrolled pain   Complete by: As directed    Call MD for:  temperature >100.4   Complete by: As directed    Diet - low sodium heart healthy   Complete by: As directed    Discharge instructions   Complete by: As directed    Advised to follow up PCP in one week. Advised to follow up orthopaedics 2 weeks postoperative. Will request PCP to check CBC/ BMP in one week. She is being discharge on omnicef twice daily for 4 days   Increase activity slowly   Complete by: As directed      Allergies as of 05/13/2019      Reactions   Latex Other (See Comments)   Swelling   Sulfa Antibiotics Nausea And Vomiting   Tetracyclines & Related       Medication List    TAKE these medications   alendronate 70 MG tablet Commonly known as: FOSAMAX Take 70 mg by mouth once a week.   amLODipine 5 MG tablet Commonly known as: NORVASC Take 1 tablet (5 mg total) by mouth daily. Start taking on: May 14, 2019   aspirin 81 MG tablet Take 81 mg by mouth daily.   cefdinir 300 MG capsule Commonly known as: OMNICEF Take 1 capsule (300 mg total) by mouth 2 (two) times daily.   cholecalciferol 1000 units tablet Commonly known as: VITAMIN D Take 1 tablet (1,000 Units total) by mouth daily.   hydrochlorothiazide 12.5 MG capsule Commonly known as: MICROZIDE Take 12.5 mg by mouth daily.   hydrocortisone 2.5 % cream Apply 1 application topically as needed (for psoriasis).   ketoconazole 2 %  cream Commonly known as: NIZORAL Apply 1 application topically as needed (for psoriasis).   lamoTRIgine 150 MG tablet Commonly known as: LAMICTAL TAKE 1 TABLET BY MOUTH TWICE DAILY.   levETIRAcetam 500 MG tablet Commonly known as: KEPPRA TAKE 1/2 TABLET IN THE AM AND 1 TABLET AT BEDTIME. What changed: See the new instructions.   memantine 10 MG tablet Commonly known as: NAMENDA TAKE 1 TABLET BY MOUTH TWICE DAILY.   pramipexole 0.25 MG tablet Commonly known as: MIRAPEX Take 1 tablet (0.25 mg total) by mouth daily.   ProAir HFA 108 (90 Base) MCG/ACT inhaler Generic drug: albuterol 2 PUFFS INTO THE LUNGS EVERY 4 HOURS IF NEEDED FOR WHEEZING/SHORTNESSOF BREATH. What changed: See the new instructions.   simvastatin 10 MG tablet Commonly known as: ZOCOR Take 10 mg by mouth daily. Reported on 06/27/2015   tamsulosin 0.4 MG Caps capsule Commonly known as: FLOMAX Take 1 capsule (0.4 mg total) by mouth daily. Start taking on: May 14, 2019   tiotropium 18 MCG inhalation capsule Commonly known as: Spiriva HandiHaler INHALE CONTENTS OF ONE CAPSULE ONCE DAILY FOR COPD. What changed:   how much to take  how to take this  when to take this  additional instructions   traMADol 50 MG tablet Commonly known as: ULTRAM Take 1 tablet (50 mg total) by mouth every 6 (six) hours as needed  for moderate pain.       Contact information for follow-up providers    Justice Britain, MD.   Specialty: Orthopedic Surgery Why: call to arrange follow up in 10-14 days Contact information: [address] [place] [address] [id number]        Leighton Ruff, MD Follow up in 1 week(s).   Specialty: Family Medicine Contact information: [place] [address] [phone]            Contact information for after-discharge care    Destination    [name] .   Service: Skilled Nursing Contact information: [place] [phone]                 Allergies  Allergen Reactions  . Latex Other (See Comments)    Swelling  . Sulfa Antibiotics Nausea And Vomiting  . Tetracyclines & Related     Consultations:  orthopaedics.   Procedures/Studies: DG Chest 2 View  Result Date: 05/03/2019 CLINICAL DATA:  Chest pain after fall EXAM: CHEST - 2 VIEW COMPARISON:  11/29/2015 FINDINGS: Low lung volumes with linear scarring or atelectasis at the bases. Stable cardiomediastinal silhouette with aortic atherosclerosis. No pneumothorax. Displaced fracture involving the proximal left humerus. IMPRESSION: 1. Low lung volumes with atelectasis or scarring at the bases 2. Displaced proximal left humerus fracture Electronically Signed   By: Donavan Foil M.D.   On: 05/03/2019 22:02   DG Abd 1 View  Result Date: 05/09/2019 CLINICAL DATA:  Abdominal bloating. EXAM: ABDOMEN - 1 VIEW COMPARISON:  None. FINDINGS: The patient's left hand overlies left side of the abdomen and could not be moved due to recent shoulder surgery. There appears to be some fecal loading in the distal colon/rectum. No evidence of bowel obstruction. Calcifications in the right upper quadrant could represent renal stones. No other acute abnormalities. IMPRESSION: Fecal loading in the rectum and sigmoid colon. No bowel obstruction noted. Calcifications in the right upper quadrant may represent renal stones. Electronically Signed   By: Dorise Bullion III M.D   On: 05/09/2019 11:29   CT Head Wo Contrast  Result Date: 05/03/2019 CLINICAL DATA:  Fall, facial lacerations EXAM: CT HEAD WITHOUT CONTRAST CT MAXILLOFACIAL WITHOUT CONTRAST CT CERVICAL SPINE WITHOUT CONTRAST TECHNIQUE: Multidetector CT imaging of the head, cervical spine, and maxillofacial structures were performed using the standard protocol without intravenous contrast. Multiplanar CT image reconstructions of the cervical spine and maxillofacial  structures were also generated. COMPARISON:  MR head 12/08/2012, CT head 08/16/2009 FINDINGS: CT HEAD FINDINGS Brain: Punctate foci of hypoattenuation the bilateral basal ganglia likely reflect sequela of remote lacunar infarcts several of which were present on comparison CT from 20/11. There mixed patchy and confluent areas of hypoattenuation throughout the subcortical deep and periventricular white matter, similar to slightly advanced from comparison in most compatible with chronic microvascular angiopathy. Symmetric prominence of the ventricles, cisterns and sulci compatible with parenchymal volume loss. Slight ex vacuo dilatation of the ventricles. No convincing evidence of acute infarction, hemorrhage, frank hydrocephalus, extra-axial collection or mass lesion/mass effect. Vascular: Atherosclerotic calcification of the carotid siphons. No hyperdense vessel. Skull: No calvarial fracture, scalp swelling or hematoma. Other: None CT MAXILLOFACIAL FINDINGS Osseous: The osseous structures appear diffusely demineralized which may limit detection of small or nondisplaced fractures. No fracture of the bony orbits. Nasal bones are intact. No mid face fractures are seen. The pterygoid plates are intact. There is a comminuted fracture of  the right mandibular condyle which appears impacted and slightly anteriorly displaced relative to the contralateral side. No other mandibular fracture is identified. The left temporomandibular joint is more normally aligned. 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 No temporal bone fractures are identified. There is complete absence of the mandibular dentition. Maxillary dental bridge is noted with several dental implants. Orbits: The globes appear normal and symmetric with evidence of prior bilateral lens extractions. Symmetric appearance of the extraocular musculature and optic nerve sheath complexes. Normal caliber of the superior ophthalmic veins.  Sinuses: Minimal thickening in the maxillary and ethmoidal sinuses. No air-fluid levels. Mastoid air cells are well aerated, better seen on CT images head. Middle ear cavities are clear. Ossicular chains appear normally configured. Soft tissues: Some pre mandibular soft tissue swelling and thickening is noted, compatible with the reported site of laceration. No other acute soft tissue abnormality is seen. CT CERVICAL SPINE FINDINGS Alignment: Slight exaggeration of the normal cervical lordosis is likely positional. Stabilization collar is not visualized at the time of exam. There is narrowing of the basion dens interval with developing and no a pseudoarticulation which is a variant finding in can be seen in degenerative change. There is slight stepwise retrolisthesis of C3-C5 on a likely degenerative basis given spondylitic and facet changes at the involved levels. No traumatic listhesis. No abnormally widened, perched or jumped facets. Skull base and vertebrae: No acute fracture. No primary bone lesion or focal pathologic process. Soft tissues and spinal canal: No pre or paravertebral fluid or swelling. No visible canal hematoma. Disc levels: Multilevel intervertebral disc height loss with spondylitic endplate changes. Disc osteophyte complexes at the C2-C6 levels efface the ventral thecal sac with at most mild canal stenosis. Uncinate spurring and facet hypertrophic changes result in mild-to-moderate multilevel neural foraminal narrowing with slightly more severe narrowing on the right at C3-4 and C4-5. Upper chest: No acute abnormality in the upper chest or imaged lung apices. Other: Slightly heterogeneous enlarged thyroid with few punctate calcifications. IMPRESSION: 1. No acute intracranial abnormality. No scalp swelling or calvarial fracture. 2. 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 3. Chronic microvascular angiopathic changes and parenchymal volume loss, somewhat progressed since comparison  studies. 4. Comminuted, impacted and anteriorly displaced fracture of the right mandibular condyle. No other mandibular fracture identified. Associated pre mandibular soft tissue swelling which likely correlates with site of reported laceration. 5. No other facial bone fracture. 6. No acute fracture or traumatic listhesis of the cervical spine. 7. Multilevel intervertebral disc height loss and spondylitic changes detailed above. 8. Heterogeneous, nodular thyroid. If further evaluation is warranted on a clinical basis following discussion of patient's risk factors and comorbidities, consider follow-up ultrasound. This follows consensus guidelines: Managing Incidental Thyroid Nodules Detected on Imaging: White Paper of the ACR Incidental Thyroid Findings Committee. J Am Coll Radiol 2015; 12:143-150. and [facility] 3-tiered system for managing ITNs: J Am Coll Radiol. 2015; Feb;12(2): 143-50 Electronically Signed   By: Lovena Le M.D.   On: 05/03/2019 22:34   CT Cervical Spine Wo Contrast  Result Date: 05/03/2019 CLINICAL DATA:  Fall, facial lacerations EXAM: CT HEAD WITHOUT CONTRAST CT MAXILLOFACIAL WITHOUT CONTRAST CT CERVICAL SPINE WITHOUT CONTRAST TECHNIQUE: Multidetector CT imaging of the head, cervical spine, and maxillofacial structures were performed using the standard protocol without intravenous contrast. Multiplanar CT image reconstructions of the cervical spine and maxillofacial structures were also generated. COMPARISON:  MR head 12/08/2012, CT head 08/16/2009 FINDINGS: CT HEAD FINDINGS Brain: Punctate foci of  hypoattenuation the bilateral basal ganglia likely reflect sequela of remote lacunar infarcts several of which were present on comparison CT from 20/11. There mixed patchy and confluent areas of hypoattenuation throughout the subcortical deep and periventricular white matter, similar to slightly advanced from comparison in most compatible with chronic microvascular angiopathy. Symmetric prominence of  the ventricles, cisterns and sulci compatible with parenchymal volume loss. Slight ex vacuo dilatation of the ventricles. No convincing evidence of acute infarction, hemorrhage, frank hydrocephalus, extra-axial collection or mass lesion/mass effect. Vascular: Atherosclerotic calcification of the carotid siphons. No hyperdense vessel. Skull: No calvarial fracture, scalp swelling or hematoma. Other: None CT MAXILLOFACIAL FINDINGS Osseous: The osseous structures appear diffusely demineralized which may limit detection of small or nondisplaced fractures. No fracture of the bony orbits. Nasal bones are intact. No mid face fractures are seen. The pterygoid plates are intact. There is a comminuted fracture of the right mandibular condyle which appears impacted and slightly anteriorly displaced relative to the contralateral side. No other mandibular fracture is identified. The left temporomandibular joint is more normally aligned. 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 No temporal bone fractures are identified. There is complete absence of the mandibular dentition. Maxillary dental bridge is noted with several dental implants. Orbits: The globes appear normal and symmetric with evidence of prior bilateral lens extractions. Symmetric appearance of the extraocular musculature and optic nerve sheath complexes. Normal caliber of the superior ophthalmic veins. Sinuses: Minimal thickening in the maxillary and ethmoidal sinuses. No air-fluid levels. Mastoid air cells are well aerated, better seen on CT images head. Middle ear cavities are clear. Ossicular chains appear normally configured. Soft tissues: Some pre mandibular soft tissue swelling and thickening is noted, compatible with the reported site of laceration. No other acute soft tissue abnormality is seen. CT CERVICAL SPINE FINDINGS Alignment: Slight exaggeration of the normal cervical lordosis is likely positional. Stabilization collar is  not visualized at the time of exam. There is narrowing of the basion dens interval with developing and no a pseudoarticulation which is a variant finding in can be seen in degenerative change. There is slight stepwise retrolisthesis of C3-C5 on a likely degenerative basis given spondylitic and facet changes at the involved levels. No traumatic listhesis. No abnormally widened, perched or jumped facets. Skull base and vertebrae: No acute fracture. No primary bone lesion or focal pathologic process. Soft tissues and spinal canal: No pre or paravertebral fluid or swelling. No visible canal hematoma. Disc levels: Multilevel intervertebral disc height loss with spondylitic endplate changes. Disc osteophyte complexes at the C2-C6 levels efface the ventral thecal sac with at most mild canal stenosis. Uncinate spurring and facet hypertrophic changes result in mild-to-moderate multilevel neural foraminal narrowing with slightly more severe narrowing on the right at C3-4 and C4-5. Upper chest: No acute abnormality in the upper chest or imaged lung apices. Other: Slightly heterogeneous enlarged thyroid with few punctate calcifications. IMPRESSION: 1. No acute intracranial abnormality. No scalp swelling or calvarial fracture. 2. 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 3. Chronic microvascular angiopathic changes and parenchymal volume loss, somewhat progressed since comparison studies. 4. Comminuted, impacted and anteriorly displaced fracture of the right mandibular condyle. No other mandibular fracture identified. Associated pre mandibular soft tissue swelling which likely correlates with site of reported laceration. 5. No other facial bone fracture. 6. No acute fracture or traumatic listhesis of the cervical spine. 7. Multilevel intervertebral disc height loss and spondylitic changes detailed above. 8. Heterogeneous, nodular thyroid. If further evaluation is  warranted on a clinical basis following discussion  of patient's risk factors and comorbidities, consider follow-up ultrasound. This follows consensus guidelines: Managing Incidental Thyroid Nodules Detected on Imaging: White Paper of the ACR Incidental Thyroid Findings Committee. J Am Coll Radiol 2015; 12:143-150. and [facility] 3-tiered system for managing ITNs: J Am Coll Radiol. 2015; Feb;12(2): 143-50 Electronically Signed   By: Lovena Le M.D.   On: 05/03/2019 22:34   DG CHEST PORT 1 VIEW  Result Date: 05/10/2019 CLINICAL DATA:  Worsening oxygen saturation. EXAM: PORTABLE CHEST 1 VIEW COMPARISON:  05/03/2019 FINDINGS: Worsening atelectasis in both lower lobes, worse on the left than the right. There could be developing pleural effusions. Upper lungs appear clear. IMPRESSION: Worsening atelectasis in both lower lobes, left more than right. Possible developing effusions. Electronically Signed   By: Nelson Chimes M.D.   On: 05/10/2019 10:52   DG Shoulder Left  Result Date: 05/03/2019 CLINICAL DATA:  Fall with shoulder pain and deformity EXAM: LEFT SHOULDER - 2+ VIEW COMPARISON:  None. FINDINGS: AC joint is intact. Acute comminuted humeral neck fracture with close to 1 shaft diameter of displacement for the midline. Moderate valgus angulation of distal fracture fragment. Mildly displaced greater tuberosity fracture fragment. IMPRESSION: Acute displaced and angulated proximal humerus fracture involving the humeral neck and greater tuberosity. Electronically Signed   By: Donavan Foil M.D.   On: 05/03/2019 22:04   CT Maxillofacial WO CM  Result Date: 05/03/2019 CLINICAL DATA:  Fall, facial lacerations EXAM: CT HEAD WITHOUT CONTRAST CT MAXILLOFACIAL WITHOUT CONTRAST CT CERVICAL SPINE WITHOUT CONTRAST TECHNIQUE: Multidetector CT imaging of the head, cervical spine, and maxillofacial structures were performed using the standard protocol without intravenous contrast. Multiplanar CT image reconstructions of the cervical spine and maxillofacial structures were also  generated. COMPARISON:  MR head 12/08/2012, CT head 08/16/2009 FINDINGS: CT HEAD FINDINGS Brain: Punctate foci of hypoattenuation the bilateral basal ganglia likely reflect sequela of remote lacunar infarcts several of which were present on comparison CT from 20/11. There mixed patchy and confluent areas of hypoattenuation throughout the subcortical deep and periventricular white matter, similar to slightly advanced from comparison in most compatible with chronic microvascular angiopathy. Symmetric prominence of the ventricles, cisterns and sulci compatible with parenchymal volume loss. Slight ex vacuo dilatation of the ventricles. No convincing evidence of acute infarction, hemorrhage, frank hydrocephalus, extra-axial collection or mass lesion/mass effect. Vascular: Atherosclerotic calcification of the carotid siphons. No hyperdense vessel. Skull: No calvarial fracture, scalp swelling or hematoma. Other: None CT MAXILLOFACIAL FINDINGS Osseous: The osseous structures appear diffusely demineralized which may limit detection of small or nondisplaced fractures. No fracture of the bony orbits. Nasal bones are intact. No mid face fractures are seen. The pterygoid plates are intact. There is a comminuted fracture of the right mandibular condyle which appears impacted and slightly anteriorly displaced relative to the contralateral side. No other mandibular fracture is identified. The left temporomandibular joint is more normally aligned. 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 No temporal bone fractures are identified. There is complete absence of the mandibular dentition. Maxillary dental bridge is noted with several dental implants. Orbits: The globes appear normal and symmetric with evidence of prior bilateral lens extractions. Symmetric appearance of the extraocular musculature and optic nerve sheath complexes. Normal caliber of the superior ophthalmic veins. Sinuses: Minimal  thickening in the maxillary and ethmoidal sinuses. No air-fluid levels. Mastoid air cells are well aerated, better seen on CT images head. Middle ear cavities are clear. Ossicular chains appear normally  configured. Soft tissues: Some pre mandibular soft tissue swelling and thickening is noted, compatible with the reported site of laceration. No other acute soft tissue abnormality is seen. CT CERVICAL SPINE FINDINGS Alignment: Slight exaggeration of the normal cervical lordosis is likely positional. Stabilization collar is not visualized at the time of exam. There is narrowing of the basion dens interval with developing and no a pseudoarticulation which is a variant finding in can be seen in degenerative change. There is slight stepwise retrolisthesis of C3-C5 on a likely degenerative basis given spondylitic and facet changes at the involved levels. No traumatic listhesis. No abnormally widened, perched or jumped facets. Skull base and vertebrae: No acute fracture. No primary bone lesion or focal pathologic process. Soft tissues and spinal canal: No pre or paravertebral fluid or swelling. No visible canal hematoma. Disc levels: Multilevel intervertebral disc height loss with spondylitic endplate changes. Disc osteophyte complexes at the C2-C6 levels efface the ventral thecal sac with at most mild canal stenosis. Uncinate spurring and facet hypertrophic changes result in mild-to-moderate multilevel neural foraminal narrowing with slightly more severe narrowing on the right at C3-4 and C4-5. Upper chest: No acute abnormality in the upper chest or imaged lung apices. Other: Slightly heterogeneous enlarged thyroid with few punctate calcifications. IMPRESSION: 1. No acute intracranial abnormality. No scalp swelling or calvarial fracture. 2. 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 3. Chronic microvascular angiopathic changes and parenchymal volume loss, somewhat progressed since comparison studies. 4.  Comminuted, impacted and anteriorly displaced fracture of the right mandibular condyle. No other mandibular fracture identified. Associated pre mandibular soft tissue swelling which likely correlates with site of reported laceration. 5. No other facial bone fracture. 6. No acute fracture or traumatic listhesis of the cervical spine. 7. Multilevel intervertebral disc height loss and spondylitic changes detailed above. 8. Heterogeneous, nodular thyroid. If further evaluation is warranted on a clinical basis following discussion of patient's risk factors and comorbidities, consider follow-up ultrasound. This follows consensus guidelines: Managing Incidental Thyroid Nodules Detected on Imaging: White Paper of the ACR Incidental Thyroid Findings Committee. J Am Coll Radiol 2015; 12:143-150. and [facility] 3-tiered system for managing ITNs: J Am Coll Radiol. 2015; Feb;12(2): 143-50 Electronically Signed   By: Lovena Le M.D.   On: 05/03/2019 22:34      Subjective: Patient was seen and examined at bedside, she denies any chest pain, shortness of breath, dizziness.  Discharge Exam: Vitals:   05/13/19 0537 05/13/19 0716  BP: (!) 141/87   Pulse: 89   Resp: 16   Temp: 97.9 F (36.6 C)   SpO2: 96% 95%   Vitals:   05/12/19 1940 05/12/19 2221 05/13/19 0537 05/13/19 0716  BP:  112/72 (!) 141/87   Pulse:  86 89   Resp:  18 16   Temp:  98.1 F (36.7 C) 97.9 F (36.6 C)   TempSrc:  Oral Oral   SpO2: 94% 95% 96% 95%  Weight:   85.1 kg   Height:        General: Pt is alert, awake, not in acute distress Cardiovascular: RRR, S1/S2 +, no rubs, no gallops Respiratory: CTA bilaterally, no wheezing, no rhonchi Abdominal: Soft, NT, ND, bowel sounds + Extremities: no edema, no cyanosis    The results of significant diagnostics from this hospitalization (including imaging, microbiology, ancillary and laboratory) are listed below for reference.     Microbiology: Recent Results (from the past 240 hour(s))   Respiratory Panel by RT PCR (Flu A&B, Covid) - Nasopharyngeal  Swab     Status: None   Collection Time: 05/03/19 11:30 PM   Specimen: Nasopharyngeal Swab  Result Value Ref Range Status   SARS Coronavirus 2 by RT PCR NEGATIVE NEGATIVE Final    Comment: (NOTE) SARS-CoV-2 target nucleic acids are NOT DETECTED. The SARS-CoV-2 RNA is generally detectable in upper respiratoy specimens during the acute phase of infection. The lowest concentration of SARS-CoV-2 viral copies this assay can detect is 131 copies/mL. A negative result does not preclude SARS-Cov-2 infection and should not be used as the sole basis for treatment or other patient management decisions. A negative result may occur with  improper specimen collection/handling, submission of specimen other than nasopharyngeal swab, presence of viral mutation(s) within the areas targeted by this assay, and inadequate number of viral copies (<131 copies/mL). A negative result must be combined with clinical observations, patient history, and epidemiological information. The expected result is Negative. Fact Sheet for Patients:  PinkCheek.be Fact Sheet for Healthcare Providers:  GravelBags.it This test is not yet ap proved or cleared by the [place] FDA and  has been authorized for detection and/or diagnosis of SARS-CoV-2 by FDA under an Emergency Use Authorization (EUA). This EUA will remain  in effect (meaning this test can be used) for the duration of the COVID-19 declaration under Section 564(b)(1) of the Act, 21 U.S.C. section 360bbb-3(b)(1), unless the authorization is terminated or revoked sooner.    Influenza A by PCR NEGATIVE NEGATIVE Final   Influenza B by PCR NEGATIVE NEGATIVE Final    Comment: (NOTE) The Xpert Xpress SARS-CoV-2/FLU/RSV assay is intended as an aid in  the diagnosis of influenza from Nasopharyngeal swab specimens and  should not be used as a sole  basis for treatment. Nasal washings and  aspirates are unacceptable for Xpert Xpress SARS-CoV-2/FLU/RSV  testing. Fact Sheet for Patients: PinkCheek.be Fact Sheet for Healthcare Providers: GravelBags.it This test is not yet approved or cleared by the [place] FDA and  has been authorized for detection and/or diagnosis of SARS-CoV-2 by  FDA under an Emergency Use Authorization (EUA). This EUA will remain  in effect (meaning this test can be used) for the duration of the  Covid-19 declaration under Section 564(b)(1) of the Act, 21  U.S.C. section 360bbb-3(b)(1), unless the authorization is  terminated or revoked. Performed at [facility], [place] [address], [place], [place] [address]   SARS CORONAVIRUS 2 (TAT 6-24 HRS) Nasopharyngeal Nasopharyngeal Swab     Status: None   Collection Time: 05/07/19  1:24 PM   Specimen: Nasopharyngeal Swab  Result Value Ref Range Status   SARS Coronavirus 2 NEGATIVE NEGATIVE Final    Comment: (NOTE) SARS-CoV-2 target nucleic acids are NOT DETECTED. The SARS-CoV-2 RNA is generally detectable in upper and lower respiratory specimens during the acute phase of infection. Negative results do not preclude SARS-CoV-2 infection, do not rule out co-infections with other pathogens, and should not be used as the sole basis for treatment or other patient management decisions. Negative results must be combined with clinical observations, patient history, and epidemiological information. The expected result is Negative. Fact Sheet for Patients: SugarRoll.be Fact Sheet for Healthcare Providers: https://www.woods-mathews.com/ This test is not yet approved or cleared by the [place] FDA and  has been authorized for detection and/or diagnosis of SARS-CoV-2 by FDA under an Emergency Use Authorization (EUA). This EUA will remain  in effect  (meaning this test can be used) for the duration of the COVID-19 declaration under Section 56 4(b)(1) of  the Act, 21 U.S.C. section 360bbb-3(b)(1), unless the authorization is terminated or revoked sooner. Performed at [facility], [place] [address], [place], [place] [address]   SARS CORONAVIRUS 2 (TAT 6-24 HRS) Nasopharyngeal Nasopharyngeal Swab     Status: None   Collection Time: 05/11/19  7:50 PM   Specimen: Nasopharyngeal Swab  Result Value Ref Range Status   SARS Coronavirus 2 NEGATIVE NEGATIVE Final    Comment: (NOTE) SARS-CoV-2 target nucleic acids are NOT DETECTED. The SARS-CoV-2 RNA is generally detectable in upper and lower respiratory specimens during the acute phase of infection. Negative results do not preclude SARS-CoV-2 infection, do not rule out co-infections with other pathogens, and should not be used as the sole basis for treatment or other patient management decisions. Negative results must be combined with clinical observations, patient history, and epidemiological information. The expected result is Negative. Fact Sheet for Patients: SugarRoll.be Fact Sheet for Healthcare Providers: https://www.woods-mathews.com/ This test is not yet approved or cleared by the [place] FDA and  has been authorized for detection and/or diagnosis of SARS-CoV-2 by FDA under an Emergency Use Authorization (EUA). This EUA will remain  in effect (meaning this test can be used) for the duration of the COVID-19 declaration under Section 56 4(b)(1) of the Act, 21 U.S.C. section 360bbb-3(b)(1), unless the authorization is terminated or revoked sooner. Performed at [facility], [place] [address] [place], [place] [address]      Labs: BNP (last 3 results) No results for input(s): BNP in the last 8760 hours. Basic Metabolic Panel: Recent Labs  Lab 05/08/19 0327 05/09/19 0338 05/10/19 0341 05/12/19 0334 05/13/19 0403  NA 138  138 139 139 138  K 3.6 3.3* 3.5 3.3* 3.6  CL 103 100 104 104 105  CO2 26 30 28 25 24   GLUCOSE 106* 104* 99 97 101*  BUN 17 15 13 19 16   CREATININE 0.48 0.52 0.55 0.62 0.60  CALCIUM 8.3* 8.4* 8.7* 8.7* 8.4*  MG 1.8 1.8 2.0  --   --   PHOS 2.1* 3.3 3.0  --   --    Liver Function Tests: Recent Labs  Lab 05/08/19 0327 05/09/19 0338 05/10/19 0341  AST 29 26 25   ALT 23 22 21   ALKPHOS 106 97 91  BILITOT 1.5* 1.1 1.4*  PROT 5.7* 5.6* 5.5*  ALBUMIN 3.1* 2.9* 2.9*   No results for input(s): LIPASE, AMYLASE in the last 168 hours. No results for input(s): AMMONIA in the last 168 hours. CBC: Recent Labs  Lab 05/09/19 0338 05/10/19 0341 05/11/19 0422 05/12/19 0334 05/13/19 0403  WBC 8.1 8.2 7.6 7.4 8.3  HGB 10.8* 10.9* 10.8* 10.8* 11.3*  HCT 33.8* 33.9* 33.4* 33.6* 36.2  MCV 96.0 95.8 95.4 95.2 97.1  PLT 184 201 220 247 275   Cardiac Enzymes: No results for input(s): CKTOTAL, CKMB, CKMBINDEX, TROPONINI in the last 168 hours. BNP: Invalid input(s): POCBNP CBG: No results for input(s): GLUCAP in the last 168 hours. D-Dimer No results for input(s): DDIMER in the last 72 hours. Hgb A1c No results for input(s): HGBA1C in the last 72 hours. Lipid Profile No results for input(s): CHOL, HDL, LDLCALC, TRIG, CHOLHDL, LDLDIRECT in the last 72 hours. Thyroid function studies No results for input(s): TSH, T4TOTAL, T3FREE, THYROIDAB in the last 72 hours.  Invalid input(s): FREET3 Anemia work up No results for input(s): VITAMINB12, FOLATE, FERRITIN, TIBC, IRON, RETICCTPCT in the last 72 hours. Urinalysis    Component Value Date/Time   COLORURINE YELLOW  05/07/2019 [place] 05/07/2019 1818   LABSPEC 1.021 05/07/2019 1818   PHURINE 5.0 05/07/2019 1818   GLUCOSEU NEGATIVE 05/07/2019 1818   HGBUR SMALL (A) 05/07/2019 1818   BILIRUBINUR NEGATIVE 05/07/2019 1818   BILIRUBINUR neg 06/08/2013 1019   KETONESUR NEGATIVE 05/07/2019 1818   PROTEINUR NEGATIVE 05/07/2019  1818   UROBILINOGEN negative 06/08/2013 1019   UROBILINOGEN 0.2 08/16/2009 1549   NITRITE NEGATIVE 05/07/2019 1818   LEUKOCYTESUR NEGATIVE 05/07/2019 1818   Sepsis Labs Invalid input(s): PROCALCITONIN,  WBC,  LACTICIDVEN Microbiology Recent Results (from the past 240 hour(s))  Respiratory Panel by RT PCR (Flu A&B, Covid) - Nasopharyngeal Swab     Status: None   Collection Time: 05/03/19 11:30 PM   Specimen: Nasopharyngeal Swab  Result Value Ref Range Status   SARS Coronavirus 2 by RT PCR NEGATIVE NEGATIVE Final    Comment: (NOTE) SARS-CoV-2 target nucleic acids are NOT DETECTED. The SARS-CoV-2 RNA is generally detectable in upper respiratoy specimens during the acute phase of infection. The lowest concentration of SARS-CoV-2 viral copies this assay can detect is 131 copies/mL. A negative result does not preclude SARS-Cov-2 infection and should not be used as the sole basis for treatment or other patient management decisions. A negative result may occur with  improper specimen collection/handling, submission of specimen other than nasopharyngeal swab, presence of viral mutation(s) within the areas targeted by this assay, and inadequate number of viral copies (<131 copies/mL). A negative result must be combined with clinical observations, patient history, and epidemiological information. The expected result is Negative. Fact Sheet for Patients:  PinkCheek.be Fact Sheet for Healthcare Providers:  GravelBags.it This test is not yet ap proved or cleared by the [place] FDA and  has been authorized for detection and/or diagnosis of SARS-CoV-2 by FDA under an Emergency Use Authorization (EUA). This EUA will remain  in effect (meaning this test can be used) for the duration of the COVID-19 declaration under Section 564(b)(1) of the Act, 21 U.S.C. section 360bbb-3(b)(1), unless the authorization is terminated or revoked  sooner.    Influenza A by PCR NEGATIVE NEGATIVE Final   Influenza B by PCR NEGATIVE NEGATIVE Final    Comment: (NOTE) The Xpert Xpress SARS-CoV-2/FLU/RSV assay is intended as an aid in  the diagnosis of influenza from Nasopharyngeal swab specimens and  should not be used as a sole basis for treatment. Nasal washings and  aspirates are unacceptable for Xpert Xpress SARS-CoV-2/FLU/RSV  testing. Fact Sheet for Patients: PinkCheek.be Fact Sheet for Healthcare Providers: GravelBags.it This test is not yet approved or cleared by the [place] FDA and  has been authorized for detection and/or diagnosis of SARS-CoV-2 by  FDA under an Emergency Use Authorization (EUA). This EUA will remain  in effect (meaning this test can be used) for the duration of the  Covid-19 declaration under Section 564(b)(1) of the Act, 21  U.S.C. section 360bbb-3(b)(1), unless the authorization is  terminated or revoked. Performed at [facility], [place] [address], [place], [place] [address]   SARS CORONAVIRUS 2 (TAT 6-24 HRS) Nasopharyngeal Nasopharyngeal Swab     Status: None   Collection Time: 05/07/19  1:24 PM   Specimen: Nasopharyngeal Swab  Result Value Ref Range Status   SARS Coronavirus 2 NEGATIVE NEGATIVE Final    Comment: (NOTE) SARS-CoV-2 target nucleic acids are NOT DETECTED. The SARS-CoV-2 RNA is generally detectable in upper and lower respiratory specimens during the acute phase of infection. Negative results do not preclude SARS-CoV-2  infection, do not rule out co-infections with other pathogens, and should not be used as the sole basis for treatment or other patient management decisions. Negative results must be combined with clinical observations, patient history, and epidemiological information. The expected result is Negative. Fact Sheet for Patients: SugarRoll.be Fact Sheet for  Healthcare Providers: https://www.woods-mathews.com/ This test is not yet approved or cleared by the [place] FDA and  has been authorized for detection and/or diagnosis of SARS-CoV-2 by FDA under an Emergency Use Authorization (EUA). This EUA will remain  in effect (meaning this test can be used) for the duration of the COVID-19 declaration under Section 56 4(b)(1) of the Act, 21 U.S.C. section 360bbb-3(b)(1), unless the authorization is terminated or revoked sooner. Performed at [facility], [place] [address], [place], [place] [address]   SARS CORONAVIRUS 2 (TAT 6-24 HRS) Nasopharyngeal Nasopharyngeal Swab     Status: None   Collection Time: 05/11/19  7:50 PM   Specimen: Nasopharyngeal Swab  Result Value Ref Range Status   SARS Coronavirus 2 NEGATIVE NEGATIVE Final    Comment: (NOTE) SARS-CoV-2 target nucleic acids are NOT DETECTED. The SARS-CoV-2 RNA is generally detectable in upper and lower respiratory specimens during the acute phase of infection. Negative results do not preclude SARS-CoV-2 infection, do not rule out co-infections with other pathogens, and should not be used as the sole basis for treatment or other patient management decisions. Negative results must be combined with clinical observations, patient history, and epidemiological information. The expected result is Negative. Fact Sheet for Patients: SugarRoll.be Fact Sheet for Healthcare Providers: https://www.woods-mathews.com/ This test is not yet approved or cleared by the [place] FDA and  has been authorized for detection and/or diagnosis of SARS-CoV-2 by FDA under an Emergency Use Authorization (EUA). This EUA will remain  in effect (meaning this test can be used) for the duration of the COVID-19 declaration under Section 56 4(b)(1) of the Act, 21 U.S.C. section 360bbb-3(b)(1), unless the authorization is terminated or revoked  sooner. Performed at [facility], [place] [address], [place], [place] [address]      Time coordinating discharge: Over 30 minutes  SIGNED:   Shawna Clamp, MD  [facility] 05/13/2019, 12:18 PM Pager   If 7PM-7AM, please contact night-coverage [email or web address]

## 2019-05-13 NOTE — Progress Notes (Signed)
Called report to receiving nurse Inez Catalina. Tramadol prescription sent with pt and dc instructions. Belongings in pt belonging bag. Pt denies pain at this time.

## 2019-05-13 NOTE — Plan of Care (Signed)
  Problem: Education: Goal: Knowledge of General Education information will improve Description: Including pain rating scale, medication(s)/side effects and non-pharmacologic comfort measures 05/13/2019 1703 by Deetta Perla, RN Outcome: Adequate for Discharge 05/13/2019 1703 by Deetta Perla, RN Outcome: Progressing   Problem: Health Behavior/Discharge Planning: Goal: Ability to manage health-related needs will improve 05/13/2019 1703 by Deetta Perla, RN Outcome: Adequate for Discharge 05/13/2019 1703 by Deetta Perla, RN Outcome: Progressing   Problem: Clinical Measurements: Goal: Ability to maintain clinical measurements within normal limits will improve 05/13/2019 1703 by Deetta Perla, RN Outcome: Adequate for Discharge 05/13/2019 1703 by Deetta Perla, RN Outcome: Progressing Goal: Will remain free from infection 05/13/2019 1703 by Deetta Perla, RN Outcome: Adequate for Discharge 05/13/2019 1703 by Deetta Perla, RN Outcome: Progressing Goal: Diagnostic test results will improve 05/13/2019 1703 by Deetta Perla, RN Outcome: Adequate for Discharge 05/13/2019 1703 by Deetta Perla, RN Outcome: Progressing Goal: Respiratory complications will improve 05/13/2019 1703 by Deetta Perla, RN Outcome: Adequate for Discharge 05/13/2019 1703 by Deetta Perla, RN Outcome: Progressing Goal: Cardiovascular complication will be avoided 05/13/2019 1703 by Deetta Perla, RN Outcome: Adequate for Discharge 05/13/2019 1703 by Deetta Perla, RN Outcome: Progressing   Problem: Activity: Goal: Risk for activity intolerance will decrease 05/13/2019 1703 by Deetta Perla, RN Outcome: Adequate for Discharge 05/13/2019 1703 by Deetta Perla, RN Outcome: Progressing   Problem: Nutrition: Goal: Adequate nutrition will be maintained 05/13/2019 1703 by Deetta Perla, RN Outcome: Adequate for Discharge 05/13/2019 1703 by Deetta Perla, RN Outcome:  Adequate for Discharge   Problem: Coping: Goal: Level of anxiety will decrease 05/13/2019 1703 by Deetta Perla, RN Outcome: Adequate for Discharge 05/13/2019 1703 by Deetta Perla, RN Outcome: Progressing   Problem: Elimination: Goal: Will not experience complications related to bowel motility 05/13/2019 1703 by Deetta Perla, RN Outcome: Adequate for Discharge 05/13/2019 1703 by Deetta Perla, RN Outcome: Progressing Goal: Will not experience complications related to urinary retention 05/13/2019 1703 by Deetta Perla, RN Outcome: Adequate for Discharge 05/13/2019 1703 by Deetta Perla, RN Outcome: Progressing   Problem: Pain Managment: Goal: General experience of comfort will improve 05/13/2019 1703 by Deetta Perla, RN Outcome: Adequate for Discharge 05/13/2019 1703 by Deetta Perla, RN Outcome: Progressing   Problem: Safety: Goal: Ability to remain free from injury will improve 05/13/2019 1703 by Deetta Perla, RN Outcome: Adequate for Discharge 05/13/2019 1703 by Deetta Perla, RN Outcome: Progressing   Problem: Skin Integrity: Goal: Risk for impaired skin integrity will decrease 05/13/2019 1703 by Deetta Perla, RN Outcome: Adequate for Discharge 05/13/2019 1703 by Deetta Perla, RN Outcome: Progressing

## 2019-05-13 NOTE — Progress Notes (Signed)
Occupational Therapy Treatment Patient Details Name: [name] MRN: [id number] DOB: 1933-01-13 Today's Date: 05/13/2019    History of present illness 86 y o female s/p fall resulting in L displaced 4 part humerus fx and mandibular condyle fx.  S/o L RTSA   OT comments  Pt doing much better with SPT. She retained education on using LUE passively during adls and worked on these today.  Also performed elbow wrist and hand exercises and edema management.  Dorsum of hand very swollen today  Follow Up Recommendations  SNF    Equipment Recommendations  3 in 1 bedside commode    Recommendations for Other Services      Precautions / Restrictions Precautions Precautions: Fall;Shoulder Type of Shoulder Precautions: may use LUE passively during adls within the following parameters:  10 ER, 45 ABD, 60 FF.  May move elbow wrist and fingers.  May dangle during shower.  NO PENDULUMS Shoulder Interventions: Shoulder sling/immobilizer Precaution Booklet Issued: Yes (comment) Restrictions LUE Weight Bearing: Non weight bearing Other Position/Activity Restrictions: LUE NWB       Mobility Bed Mobility         Supine to sit: Mod assist;HOB elevated        Transfers   Equipment used: Rolling walker (2 wheeled)   Sit to Stand: Min assist;From elevated surface Stand pivot transfers: Min assist       General transfer comment: assist to rise and steady. Cued not to use L hand on walker    Balance             Standing balance-Leahy Scale: Poor                             ADL either performed or assessed with clinical judgement   ADL           Upper Body Bathing: Moderate assistance   Lower Body Bathing: Maximal assistance   Upper Body Dressing : Moderate assistance;Maximal assistance       Toilet Transfer: Minimal assistance;Stand-pivot;RW(with one hand to recliner)             General ADL Comments: performed bathing. Pt seesawed under L arm  and leaned for washing.  Tried a big overhead shirt, but fabric was not stretchy, so stopped and recommended she wear her button down shirt.  SPT to chair and performed exercises.  Pt still with edema.  She reports that hand was better before she went to sleep.  Performed retrograde massage (gentle) and positioned hand higher than wrist     Vision       Perception     Praxis      Cognition Arousal/Alertness: Awake/alert Behavior During Therapy: WFL for tasks assessed/performed Overall Cognitive Status: Within Functional Limits for tasks assessed                                          Exercises Other Exercises Other Exercises: performed 10 reps finger, wrist, sup/pron, elbow sitting EOB.  Performed retrograde massage and positioning for edema. Pt used squeeze ball. Hand over hand needed to perform wrist exercises today; she was performing ulna/radial deviation Other Exercises: educated on edema retrograde massage to Lt hand and for gentle/light pressure as well as to start at the base of her fingers and work up towards her wrist   Shoulder Instructions  General Comments      Pertinent Vitals/ Pain       Pain Assessment: Faces Faces Pain Scale: Hurts little more Pain Location: chest with coughing Pain Descriptors / Indicators: Aching Pain Intervention(s): Limited activity within patient's tolerance;Monitored during session;Repositioned(RN aware; ekg was done earlier)  Home Living                                          Prior Functioning/Environment              Frequency  Min 2X/week        Progress Toward Goals  OT Goals(current goals can now be found in the care plan section)  Progress towards OT goals: Progressing toward goals     Plan      Co-evaluation                 AM-PAC OT "6 Clicks" Daily Activity     Outcome Measure   Help from another person eating meals?: A Little Help from another person  taking care of personal grooming?: A Little Help from another person toileting, which includes using toliet, bedpan, or urinal?: A Lot Help from another person bathing (including washing, rinsing, drying)?: A Lot Help from another person to put on and taking off regular upper body clothing?: A Lot Help from another person to put on and taking off regular lower body clothing?: A Lot 6 Click Score: 14    End of Session    OT Visit Diagnosis: Unsteadiness on feet (R26.81);History of falling (Z91.81);Muscle weakness (generalized) (M62.81)   Activity Tolerance Patient tolerated treatment well   Patient Left with call bell/phone within reach;with bed alarm set;in chair;Other (comment)   Nurse Communication          [id number] OT Time Calculation (min): 44 min  Charges: OT General Charges $OT Visit: 1 Visit OT Treatments $Self Care/Home Management : 23-37 mins $Therapeutic Exercise: 8-22 mins  Verble Styron S, OTR/L Acute Rehabilitation Services 05/13/2019   Kiarah Eckstein 05/13/2019, 11:05 AM

## 2019-05-13 NOTE — TOC Transition Note (Addendum)
Transition of Care [facility]) - CM/SW Discharge Note   Patient Details  Name: [name] MRN: [id number] Date of Birth: 1932-11-18  Transition of Care [facility]) CM/SW Contact:  Lia Hopping, [place] Phone Number: 05/13/2019, 10:48 AM   Clinical Narrative:    Patient medically stable to transfer today. Updated [employer] authorization request for transfer to [facility]- [id number] starting 2/4, 5 days next review 2/8. Fax Clinicals to [facility]:[phone].  CSW reached out the facility Rep. Katie, waiting for a return call.   Nurse call report to: [facility] Room [address] Call report to: [phone]   Final next level of care: Skilled Nursing Facility Barriers to Discharge: Barriers Resolved   Patient Goals and CMS Choice     Choice offered to / list presented to : NA  Discharge Placement              Patient chooses bed at: [facility] Patient to be transferred to facility by: [place] Name of family member notified: Gita Kudo [phone] Patient and family notified of of transfer: 05/13/19  Discharge Plan and Services In-house Referral: Clinical Social Work Discharge Planning Services: CM Consult                                 Social Determinants of Health (SDOH) Interventions     Readmission Risk Interventions No flowsheet data found.

## 2019-05-14 ENCOUNTER — Encounter: Payer: Self-pay | Admitting: Internal Medicine

## 2019-05-14 ENCOUNTER — Non-Acute Institutional Stay (SKILLED_NURSING_FACILITY): Payer: Medicare PPO | Admitting: Internal Medicine

## 2019-05-14 DIAGNOSIS — J438 Other emphysema: Secondary | ICD-10-CM

## 2019-05-14 DIAGNOSIS — S42202S Unspecified fracture of upper end of left humerus, sequela: Secondary | ICD-10-CM

## 2019-05-14 DIAGNOSIS — M810 Age-related osteoporosis without current pathological fracture: Secondary | ICD-10-CM

## 2019-05-14 DIAGNOSIS — G40909 Epilepsy, unspecified, not intractable, without status epilepticus: Secondary | ICD-10-CM

## 2019-05-14 DIAGNOSIS — I10 Essential (primary) hypertension: Secondary | ICD-10-CM | POA: Diagnosis not present

## 2019-05-14 DIAGNOSIS — J189 Pneumonia, unspecified organism: Secondary | ICD-10-CM

## 2019-05-14 DIAGNOSIS — R339 Retention of urine, unspecified: Secondary | ICD-10-CM

## 2019-05-14 NOTE — Progress Notes (Signed)
Provider:  Veleta Miners MD Location:   [facility] Room Number: [id number] Place of Service:  SNF (31)  PCP: Leighton Ruff, MD Patient Care Team: Leighton Ruff, MD as PCP - General (Family Medicine) [name], MD as Consulting Physician (Ophthalmology) [name], MD as Consulting Physician (Cardiology) [name], MD as Consulting Physician (Dermatology) [name], MD as Consulting Physician (Orthopedic Surgery) [name], MD as Consulting Physician (Orthopedic Surgery) [name], MD as Consulting Physician (Orthopedic Surgery) [name], [place], [place], NP as Nurse Practitioner (Nurse Practitioner) [name], MD as Consulting Physician (Neurology)  Extended Emergency Contact Information Primary Emergency Contact: [name],[name] Address: [address]          [place] [address] [place] Phone: [phone] Relation: Sister Secondary Emergency Contact: [name] [place] Phone: [phone] Relation: Sister  Code Status: DNR Goals of Care: Advanced Directive information Advanced Directives 05/14/2019  Does Patient Have a Medical Advance Directive? Yes  Type of Advance Directive Out of facility DNR (pink MOST or yellow form)  Does patient want to make changes to medical advance directive? No - Patient declined  Copy of [place] in Chart? -  Pre-existing out of facility DNR order (yellow form or pink MOST form) Yellow form placed in chart (order not valid for inpatient use);Pink MOST form placed in chart (order not valid for inpatient use)      Chief Complaint  Patient presents with  . New Admit To SNF    Admission    HPI: Patient is a 86 y.o. female seen today for admission to SNF for Therapy. She was in the Hospital from 1/25-2/4 for Left Humerus Fracture with Displacement needing Arthroplasty , Right Mandibular Fracture, Pneumonia  and Urinary retention  Patient has h/o COPD, Hypertension,Seizure Disorder,Mild Cognitive impairment, Hyperlipidemia  [facility] and Sustained above mentioned Fracture. No Syncope or Seizures She Underwent Arthroplasty on Jan 28. Post op developed Urinary Retention needing Flomax and Catheter. Was taken off on Day of discharge and doing well now. Also Developed New Infiltrate Diagnosed with Pneumonia was treated with Omnicef She is doing well now. Denies any Chest pain,Fever or Cough Is on Liquid Diet due to her Mandibular fracture. Pain seems Controlled.  She lives in [place]. Was not driving. But very independent in her ADLS and IADLS. Does not have children. Sisters are POA      Past Medical History:  Diagnosis Date  . Acute bronchitis 05/23/2011  . Acute upper respiratory infections of unspecified site 05/23/2011  . Arthritis   . Chronic airway obstruction, not elsewhere classified 05/23/2011  . Disturbance of salivary secretion 01/31/2011  . Dizziness and giddiness 01/31/2011  . Essential tremor 04/25/2014  . External hemorrhoids without mention of complication [id number]  . Gait disorder 04/25/2014  . Insomnia, unspecified 09/12/2011  . Lumbago 01/31/2011  . Major depressive disorder, single episode, unspecified 01/31/2011  . Memory disorder 04/25/2014  . Mitral valve disorders(424.0) 01/31/2011  . Other and unspecified hyperlipidemia 01/31/2011  . Other convulsions 01/31/2011  . Other emphysema ([place]) 01/31/2011  . Pain in joint, site unspecified 01/31/2011  . Restless legs syndrome (RLS) 09/12/2011  . Retinal detachment with retinal defect of right eye 2011   right eye twice  . Senile osteoporosis 01/31/2011  . Spontaneous ecchymoses 01/31/2011  . Stiffness of joints, not elsewhere classified, multiple sites 01/31/2011  . Unspecified essential hypertension 01/31/2011  Past Surgical History:  Procedure Laterality Date  . ABDOMINAL  HYSTERECTOMY  06/21/2003   TAH/BSO, omenectomy PSB resect, Stg IC cystadenofibroma  . CHOLECYSTECTOMY  2005   Dr. Marlou Starks  . ELBOW SURGERY Right 2008   broken   Dr. Apolonio Schneiders  . EYE SURGERY    . RETINAL DETACHMENT SURGERY N/A    two  . REVERSE SHOULDER ARTHROPLASTY Left 05/06/2019   Procedure: REVERSE SHOULDER ARTHROPLASTY;  Surgeon: Justice Britain, MD;  Location: [facility];  Service: Orthopedics;  Laterality: Left;  161min  . ROTATOR CUFF REPAIR Right 2012   Dr. Theda Sers  . SQUAMOUS CELL CARCINOMA EXCISION Bilateral 2012, 8/14   [place] on legs   Dr. Sarajane Jews  . TONSILLECTOMY  1941  . VIDEO BRONCHOSCOPY WITH ENDOBRONCHIAL NAVIGATION N/A 11/29/2015   Procedure: VIDEO BRONCHOSCOPY WITH ENDOBRONCHIAL NAVIGATION;  Surgeon: Collene Gobble, MD;  Location: [facility];  Service: Thoracic;  Laterality: N/A;    reports that she quit smoking about 31 years ago. Her smoking use included cigarettes. She has a 40.00 pack-year smoking history. She has never used smokeless tobacco. She reports that she does not drink alcohol or use drugs. Social History   Socioeconomic History  . Marital status: Divorced    Spouse name: Not on file  . Number of children: 0  . Years of education: Not on file  . Highest education level: Not on file  Occupational History  . Occupation: retired Tourist information centre manager for [employer]  Tobacco Use  . Smoking status: Former Smoker    Packs/day: 1.00    Years: 40.00    Pack years: 40.00    Types: Cigarettes    Quit date: 04/08/1988    Years since quitting: 31.1  . Smokeless tobacco: Never Used  . Tobacco comment: quit in 1990  Substance and Sexual Activity  . Alcohol use: No    Alcohol/week: 0.0 standard drinks  . Drug use: No  . Sexual activity: Never  Other Topics Concern  . Not on file  Social History Narrative   Lives at [facility]   No children   Divorced   Exercise climbs steps 4 flights daily PT for [employer]   Alcohol none   Stopped smoking 1989   Patient drinks 3  large sodas daily.   Patient is right handed.   Social Determinants of Health   Financial Resource Strain:   . Difficulty of Paying Living Expenses: Not on file  Food Insecurity:   . Worried About Charity fundraiser in the Last Year: Not on file  . Ran Out of Food in the Last Year: Not on file  Transportation Needs:   . Lack of Transportation (Medical): Not on file  . Lack of Transportation (Non-Medical): Not on file  Physical Activity:   . Days of Exercise per Week: Not on file  . Minutes of Exercise per Session: Not on file  Stress:   . Feeling of Stress : Not on file  Social Connections:   . Frequency of Communication with Friends and Family: Not on file  . Frequency of Social Gatherings with Friends and Family: Not on file  . Attends Religious Services: Not on file  . Active Member of Clubs or Organizations: Not on file  . Attends Archivist Meetings: Not on file  . Marital Status: Not on file  Intimate Partner Violence:   . Fear of Current or Ex-Partner: Not on file  . Emotionally Abused: Not on file  . Physically Abused: Not  on file  . Sexually Abused: Not on file    Functional Status Survey:    Family History  Problem Relation Age of Onset  . Heart disease Father        CHF  . Cancer Mother        breast  . Seizures Sister     Health Maintenance  Topic Date Due  . MAMMOGRAM  06/14/2015  . TETANUS/TDAP  04/08/2021  . INFLUENZA VACCINE  Completed  . DEXA SCAN  Completed  . PNA vac Low Risk Adult  Completed    Allergies  Allergen Reactions  . Latex Other (See Comments)    Swelling  . Sulfa Antibiotics Nausea And Vomiting  . Tetracyclines & Related     Allergies as of 05/14/2019      Reactions   Latex Other (See Comments)   Swelling   Sulfa Antibiotics Nausea And Vomiting   Tetracyclines & Related       Medication List       Accurate as of May 14, 2019  1:27 PM. If you have any questions, ask your nurse or doctor.          alendronate 70 MG tablet Commonly known as: FOSAMAX Take 70 mg by mouth once a week.   amLODipine 5 MG tablet Commonly known as: NORVASC Take 1 tablet (5 mg total) by mouth daily.   aspirin 81 MG tablet Take 81 mg by mouth daily.   cefdinir 300 MG capsule Commonly known as: OMNICEF Take 1 capsule (300 mg total) by mouth 2 (two) times daily.   cholecalciferol 1000 units tablet Commonly known as: VITAMIN D Take 1 tablet (1,000 Units total) by mouth daily.   hydrochlorothiazide 12.5 MG capsule Commonly known as: MICROZIDE Take 12.5 mg by mouth daily.   hydrocortisone 2.5 % cream Apply 1 application topically as needed (for psoriasis).   ketoconazole 2 % cream Commonly known as: NIZORAL Apply 1 application topically as needed (for psoriasis).   lamoTRIgine 150 MG tablet Commonly known as: LAMICTAL TAKE 1 TABLET BY MOUTH TWICE DAILY.   levETIRAcetam 500 MG tablet Commonly known as: KEPPRA TAKE 1/2 TABLET IN THE AM AND 1 TABLET AT BEDTIME. What changed: See the new instructions.   memantine 10 MG tablet Commonly known as: NAMENDA TAKE 1 TABLET BY MOUTH TWICE DAILY.   pramipexole 0.25 MG tablet Commonly known as: MIRAPEX Take 1 tablet (0.25 mg total) by mouth daily.   ProAir HFA 108 (90 Base) MCG/ACT inhaler Generic drug: albuterol 2 PUFFS INTO THE LUNGS EVERY 4 HOURS IF NEEDED FOR WHEEZING/SHORTNESSOF BREATH. What changed: See the new instructions.   simvastatin 10 MG tablet Commonly known as: ZOCOR Take 10 mg by mouth daily. Reported on 06/27/2015   tamsulosin 0.4 MG Caps capsule Commonly known as: FLOMAX Take 1 capsule (0.4 mg total) by mouth daily.   tiotropium 18 MCG inhalation capsule Commonly known as: Spiriva HandiHaler INHALE CONTENTS OF ONE CAPSULE ONCE DAILY FOR COPD. What changed:   how much to take  how to take this  when to take this  additional instructions   traMADol 50 MG tablet Commonly known as: ULTRAM Take 1 tablet (50 mg total)  by mouth every 6 (six) hours as needed for moderate pain.   Tubersol 5 UNIT/0.1ML injection Generic drug: tuberculin Inject into the skin once. Start taking on: May 25, 2019       Review of Systems  Review of Systems  Constitutional: Negative for activity change, appetite change,  chills, diaphoresis, fatigue and fever.  HENT: Negative for mouth sores, postnasal drip, rhinorrhea, sinus pain and sore throat.   Respiratory: Negative for apnea, cough, chest tightness, shortness of breath and wheezing.   Cardiovascular: Negative for chest pain, palpitations and leg swelling.  Gastrointestinal: Negative for abdominal distention, abdominal pain, constipation, diarrhea, nausea and vomiting.  Genitourinary: Negative for dysuria and frequency.  Musculoskeletal: Negative for arthralgias, joint swelling and myalgias.  Skin: Negative for rash.  Neurological: Negative for dizziness, syncope, weakness, light-headedness and numbness.  Psychiatric/Behavioral: Negative for behavioral problems, confusion and sleep disturbance.     Vitals:   05/14/19 1310  BP: 120/78  Pulse: 78  Resp: 20  Temp: 97.6 F (36.4 C)  SpO2: 93%  Weight: 187 lb 9.6 oz (85.1 kg)  Height: 5' 5.5" (1.664 m)   Body mass index is 30.74 kg/m. Physical Exam  Constitutional: Oriented to person, place, and time. Well-developed and well-nourished.  HENT:  Head: Normocephalic. Has swelling around there right mandible Mouth/Throat: Oropharynx is clear and moist.  Eyes: Pupils are equal, round, and reactive to light.  Neck: Neck supple.  Cardiovascular: Normal rate and normal heart sounds.  No murmur heard. Pulmonary/Chest: Effort normal and breath sounds normal. No respiratory distress. No wheezes. She has no rales. Has Hematoma Below her Left breast and across the chest  Abdominal: Soft. Bowel sounds are normal. No distension. There is no tenderness. There is no rebound.  Musculoskeletal: No edema. Left Hand in the  Sling and soft cast Has some swelling in her Hand Lymphadenopathy: none Neurological: Alert and oriented to person, place, and time.  Skin: Skin is warm and dry.  Psychiatric: Normal mood and affect. Behavior is normal. Thought content normal.    Labs reviewed: Basic Metabolic Panel: Recent Labs    05/08/19 0327 05/08/19 0327 05/09/19 0338 05/09/19 0338 05/10/19 0341 05/12/19 0334 05/13/19 0403  NA 138   < > 138   < > 139 139 138  K 3.6   < > 3.3*   < > 3.5 3.3* 3.6  CL 103   < > 100   < > 104 104 105  CO2 26   < > 30   < > 28 25 24   GLUCOSE 106*   < > 104*   < > 99 97 101*  BUN 17   < > 15   < > 13 19 16   CREATININE 0.48   < > 0.52   < > 0.55 0.62 0.60  CALCIUM 8.3*   < > 8.4*   < > 8.7* 8.7* 8.4*  MG 1.8  --  1.8  --  2.0  --   --   PHOS 2.1*  --  3.3  --  3.0  --   --    < > = values in this interval not displayed.   Liver Function Tests: Recent Labs    05/08/19 0327 05/09/19 0338 05/10/19 0341  AST 29 26 25   ALT 23 22 21   ALKPHOS 106 97 91  BILITOT 1.5* 1.1 1.4*  PROT 5.7* 5.6* 5.5*  ALBUMIN 3.1* 2.9* 2.9*   No results for input(s): LIPASE, AMYLASE in the last 8760 hours. No results for input(s): AMMONIA in the last 8760 hours. CBC: Recent Labs    06/16/18 1056 05/04/19 0025 05/04/19 0449 05/11/19 0422 05/12/19 0334 05/13/19 0403  WBC 6.0 13.3*   < > 7.6 7.4 8.3  NEUTROABS 3.7 11.3*  --   --   --   --  HGB 15.1 13.6   < > 10.8* 10.8* 11.3*  HCT 44.8 41.6   < > 33.4* 33.6* 36.2  MCV 89 93.7   < > 95.4 95.2 97.1  PLT 218 198   < > 220 247 275   < > = values in this interval not displayed.   Cardiac Enzymes: No results for input(s): CKTOTAL, CKMB, CKMBINDEX, TROPONINI in the last 8760 hours. BNP: Invalid input(s): POCBNP No results found for: HGBA1C Lab Results  Component Value Date   TSH 1.51 02/28/2016   Lab Results  Component Value Date   VITAMINB12 425 04/25/2014   No results found for: FOLATE Lab Results  Component Value Date    IRON 29 05/09/2019   TIBC 292 05/09/2019    Imaging and Procedures obtained prior to SNF admission: DG Chest 2 View  Result Date: 05/03/2019 CLINICAL DATA:  Chest pain after fall EXAM: CHEST - 2 VIEW COMPARISON:  11/29/2015 FINDINGS: Low lung volumes with linear scarring or atelectasis at the bases. Stable cardiomediastinal silhouette with aortic atherosclerosis. No pneumothorax. Displaced fracture involving the proximal left humerus. IMPRESSION: 1. Low lung volumes with atelectasis or scarring at the bases 2. Displaced proximal left humerus fracture Electronically Signed   By: Donavan Foil M.D.   On: 05/03/2019 22:02   CT Head Wo Contrast  Result Date: 05/03/2019 CLINICAL DATA:  Fall, facial lacerations EXAM: CT HEAD WITHOUT CONTRAST CT MAXILLOFACIAL WITHOUT CONTRAST CT CERVICAL SPINE WITHOUT CONTRAST TECHNIQUE: Multidetector CT imaging of the head, cervical spine, and maxillofacial structures were performed using the standard protocol without intravenous contrast. Multiplanar CT image reconstructions of the cervical spine and maxillofacial structures were also generated. COMPARISON:  MR head 12/08/2012, CT head 08/16/2009 FINDINGS: CT HEAD FINDINGS Brain: Punctate foci of hypoattenuation the bilateral basal ganglia likely reflect sequela of remote lacunar infarcts several of which were present on comparison CT from 20/11. There mixed patchy and confluent areas of hypoattenuation throughout the subcortical deep and periventricular white matter, similar to slightly advanced from comparison in most compatible with chronic microvascular angiopathy. Symmetric prominence of the ventricles, cisterns and sulci compatible with parenchymal volume loss. Slight ex vacuo dilatation of the ventricles. No convincing evidence of acute infarction, hemorrhage, frank hydrocephalus, extra-axial collection or mass lesion/mass effect. Vascular: Atherosclerotic calcification of the carotid siphons. No hyperdense vessel.  Skull: No calvarial fracture, scalp swelling or hematoma. Other: None CT MAXILLOFACIAL FINDINGS Osseous: The osseous structures appear diffusely demineralized which may limit detection of small or nondisplaced fractures. No fracture of the bony orbits. Nasal bones are intact. No mid face fractures are seen. The pterygoid plates are intact. There is a comminuted fracture of the right mandibular condyle which appears impacted and slightly anteriorly displaced relative to the contralateral side. No other mandibular fracture is identified. The left temporomandibular joint is more normally aligned. 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 No temporal bone fractures are identified. There is complete absence of the mandibular dentition. Maxillary dental bridge is noted with several dental implants. Orbits: The globes appear normal and symmetric with evidence of prior bilateral lens extractions. Symmetric appearance of the extraocular musculature and optic nerve sheath complexes. Normal caliber of the superior ophthalmic veins. Sinuses: Minimal thickening in the maxillary and ethmoidal sinuses. No air-fluid levels. Mastoid air cells are well aerated, better seen on CT images head. Middle ear cavities are clear. Ossicular chains appear normally configured. Soft tissues: Some pre mandibular soft tissue swelling and thickening is noted, compatible with the reported site  of laceration. No other acute soft tissue abnormality is seen. CT CERVICAL SPINE FINDINGS Alignment: Slight exaggeration of the normal cervical lordosis is likely positional. Stabilization collar is not visualized at the time of exam. There is narrowing of the basion dens interval with developing and no a pseudoarticulation which is a variant finding in can be seen in degenerative change. There is slight stepwise retrolisthesis of C3-C5 on a likely degenerative basis given spondylitic and facet changes at the involved levels. No  traumatic listhesis. No abnormally widened, perched or jumped facets. Skull base and vertebrae: No acute fracture. No primary bone lesion or focal pathologic process. Soft tissues and spinal canal: No pre or paravertebral fluid or swelling. No visible canal hematoma. Disc levels: Multilevel intervertebral disc height loss with spondylitic endplate changes. Disc osteophyte complexes at the C2-C6 levels efface the ventral thecal sac with at most mild canal stenosis. Uncinate spurring and facet hypertrophic changes result in mild-to-moderate multilevel neural foraminal narrowing with slightly more severe narrowing on the right at C3-4 and C4-5. Upper chest: No acute abnormality in the upper chest or imaged lung apices. Other: Slightly heterogeneous enlarged thyroid with few punctate calcifications. IMPRESSION: 1. No acute intracranial abnormality. No scalp swelling or calvarial fracture. 2. 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 3. Chronic microvascular angiopathic changes and parenchymal volume loss, somewhat progressed since comparison studies. 4. Comminuted, impacted and anteriorly displaced fracture of the right mandibular condyle. No other mandibular fracture identified. Associated pre mandibular soft tissue swelling which likely correlates with site of reported laceration. 5. No other facial bone fracture. 6. No acute fracture or traumatic listhesis of the cervical spine. 7. Multilevel intervertebral disc height loss and spondylitic changes detailed above. 8. Heterogeneous, nodular thyroid. If further evaluation is warranted on a clinical basis following discussion of patient's risk factors and comorbidities, consider follow-up ultrasound. This follows consensus guidelines: Managing Incidental Thyroid Nodules Detected on Imaging: White Paper of the ACR Incidental Thyroid Findings Committee. J Am Coll Radiol 2015; 12:143-150. and [facility] 3-tiered system for managing ITNs: J Am Coll Radiol. 2015;  Feb;12(2): 143-50 Electronically Signed   By: Lovena Le M.D.   On: 05/03/2019 22:34   CT Cervical Spine Wo Contrast  Result Date: 05/03/2019 CLINICAL DATA:  Fall, facial lacerations EXAM: CT HEAD WITHOUT CONTRAST CT MAXILLOFACIAL WITHOUT CONTRAST CT CERVICAL SPINE WITHOUT CONTRAST TECHNIQUE: Multidetector CT imaging of the head, cervical spine, and maxillofacial structures were performed using the standard protocol without intravenous contrast. Multiplanar CT image reconstructions of the cervical spine and maxillofacial structures were also generated. COMPARISON:  MR head 12/08/2012, CT head 08/16/2009 FINDINGS: CT HEAD FINDINGS Brain: Punctate foci of hypoattenuation the bilateral basal ganglia likely reflect sequela of remote lacunar infarcts several of which were present on comparison CT from 20/11. There mixed patchy and confluent areas of hypoattenuation throughout the subcortical deep and periventricular white matter, similar to slightly advanced from comparison in most compatible with chronic microvascular angiopathy. Symmetric prominence of the ventricles, cisterns and sulci compatible with parenchymal volume loss. Slight ex vacuo dilatation of the ventricles. No convincing evidence of acute infarction, hemorrhage, frank hydrocephalus, extra-axial collection or mass lesion/mass effect. Vascular: Atherosclerotic calcification of the carotid siphons. No hyperdense vessel. Skull: No calvarial fracture, scalp swelling or hematoma. Other: None CT MAXILLOFACIAL FINDINGS Osseous: The osseous structures appear diffusely demineralized which may limit detection of small or nondisplaced fractures. No fracture of the bony orbits. Nasal bones are intact. No mid face fractures are seen. The pterygoid plates are  intact. There is a comminuted fracture of the right mandibular condyle which appears impacted and slightly anteriorly displaced relative to the contralateral side. No other mandibular fracture is identified.  The left temporomandibular joint is more normally aligned. 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 No temporal bone fractures are identified. There is complete absence of the mandibular dentition. Maxillary dental bridge is noted with several dental implants. Orbits: The globes appear normal and symmetric with evidence of prior bilateral lens extractions. Symmetric appearance of the extraocular musculature and optic nerve sheath complexes. Normal caliber of the superior ophthalmic veins. Sinuses: Minimal thickening in the maxillary and ethmoidal sinuses. No air-fluid levels. Mastoid air cells are well aerated, better seen on CT images head. Middle ear cavities are clear. Ossicular chains appear normally configured. Soft tissues: Some pre mandibular soft tissue swelling and thickening is noted, compatible with the reported site of laceration. No other acute soft tissue abnormality is seen. CT CERVICAL SPINE FINDINGS Alignment: Slight exaggeration of the normal cervical lordosis is likely positional. Stabilization collar is not visualized at the time of exam. There is narrowing of the basion dens interval with developing and no a pseudoarticulation which is a variant finding in can be seen in degenerative change. There is slight stepwise retrolisthesis of C3-C5 on a likely degenerative basis given spondylitic and facet changes at the involved levels. No traumatic listhesis. No abnormally widened, perched or jumped facets. Skull base and vertebrae: No acute fracture. No primary bone lesion or focal pathologic process. Soft tissues and spinal canal: No pre or paravertebral fluid or swelling. No visible canal hematoma. Disc levels: Multilevel intervertebral disc height loss with spondylitic endplate changes. Disc osteophyte complexes at the C2-C6 levels efface the ventral thecal sac with at most mild canal stenosis. Uncinate spurring and facet hypertrophic changes result in  mild-to-moderate multilevel neural foraminal narrowing with slightly more severe narrowing on the right at C3-4 and C4-5. Upper chest: No acute abnormality in the upper chest or imaged lung apices. Other: Slightly heterogeneous enlarged thyroid with few punctate calcifications. IMPRESSION: 1. No acute intracranial abnormality. No scalp swelling or calvarial fracture. 2. 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 3. Chronic microvascular angiopathic changes and parenchymal volume loss, somewhat progressed since comparison studies. 4. Comminuted, impacted and anteriorly displaced fracture of the right mandibular condyle. No other mandibular fracture identified. Associated pre mandibular soft tissue swelling which likely correlates with site of reported laceration. 5. No other facial bone fracture. 6. No acute fracture or traumatic listhesis of the cervical spine. 7. Multilevel intervertebral disc height loss and spondylitic changes detailed above. 8. Heterogeneous, nodular thyroid. If further evaluation is warranted on a clinical basis following discussion of patient's risk factors and comorbidities, consider follow-up ultrasound. This follows consensus guidelines: Managing Incidental Thyroid Nodules Detected on Imaging: White Paper of the ACR Incidental Thyroid Findings Committee. J Am Coll Radiol 2015; 12:143-150. and [facility] 3-tiered system for managing ITNs: J Am Coll Radiol. 2015; Feb;12(2): 143-50 Electronically Signed   By: Lovena Le M.D.   On: 05/03/2019 22:34   DG Shoulder Left  Result Date: 05/03/2019 CLINICAL DATA:  Fall with shoulder pain and deformity EXAM: LEFT SHOULDER - 2+ VIEW COMPARISON:  None. FINDINGS: AC joint is intact. Acute comminuted humeral neck fracture with close to 1 shaft diameter of displacement for the midline. Moderate valgus angulation of distal fracture fragment. Mildly displaced greater tuberosity fracture fragment. IMPRESSION: Acute displaced and angulated proximal  humerus fracture involving the humeral neck and  greater tuberosity. Electronically Signed   By: Donavan Foil M.D.   On: 05/03/2019 22:04   CT Maxillofacial WO CM  Result Date: 05/03/2019 CLINICAL DATA:  Fall, facial lacerations EXAM: CT HEAD WITHOUT CONTRAST CT MAXILLOFACIAL WITHOUT CONTRAST CT CERVICAL SPINE WITHOUT CONTRAST TECHNIQUE: Multidetector CT imaging of the head, cervical spine, and maxillofacial structures were performed using the standard protocol without intravenous contrast. Multiplanar CT image reconstructions of the cervical spine and maxillofacial structures were also generated. COMPARISON:  MR head 12/08/2012, CT head 08/16/2009 FINDINGS: CT HEAD FINDINGS Brain: Punctate foci of hypoattenuation the bilateral basal ganglia likely reflect sequela of remote lacunar infarcts several of which were present on comparison CT from 20/11. There mixed patchy and confluent areas of hypoattenuation throughout the subcortical deep and periventricular white matter, similar to slightly advanced from comparison in most compatible with chronic microvascular angiopathy. Symmetric prominence of the ventricles, cisterns and sulci compatible with parenchymal volume loss. Slight ex vacuo dilatation of the ventricles. No convincing evidence of acute infarction, hemorrhage, frank hydrocephalus, extra-axial collection or mass lesion/mass effect. Vascular: Atherosclerotic calcification of the carotid siphons. No hyperdense vessel. Skull: No calvarial fracture, scalp swelling or hematoma. Other: None CT MAXILLOFACIAL FINDINGS Osseous: The osseous structures appear diffusely demineralized which may limit detection of small or nondisplaced fractures. No fracture of the bony orbits. Nasal bones are intact. No mid face fractures are seen. The pterygoid plates are intact. There is a comminuted fracture of the right mandibular condyle which appears impacted and slightly anteriorly displaced relative to the contralateral side.  No other mandibular fracture is identified. The left temporomandibular joint is more normally aligned. 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 No temporal bone fractures are identified. There is complete absence of the mandibular dentition. Maxillary dental bridge is noted with several dental implants. Orbits: The globes appear normal and symmetric with evidence of prior bilateral lens extractions. Symmetric appearance of the extraocular musculature and optic nerve sheath complexes. Normal caliber of the superior ophthalmic veins. Sinuses: Minimal thickening in the maxillary and ethmoidal sinuses. No air-fluid levels. Mastoid air cells are well aerated, better seen on CT images head. Middle ear cavities are clear. Ossicular chains appear normally configured. Soft tissues: Some pre mandibular soft tissue swelling and thickening is noted, compatible with the reported site of laceration. No other acute soft tissue abnormality is seen. CT CERVICAL SPINE FINDINGS Alignment: Slight exaggeration of the normal cervical lordosis is likely positional. Stabilization collar is not visualized at the time of exam. There is narrowing of the basion dens interval with developing and no a pseudoarticulation which is a variant finding in can be seen in degenerative change. There is slight stepwise retrolisthesis of C3-C5 on a likely degenerative basis given spondylitic and facet changes at the involved levels. No traumatic listhesis. No abnormally widened, perched or jumped facets. Skull base and vertebrae: No acute fracture. No primary bone lesion or focal pathologic process. Soft tissues and spinal canal: No pre or paravertebral fluid or swelling. No visible canal hematoma. Disc levels: Multilevel intervertebral disc height loss with spondylitic endplate changes. Disc osteophyte complexes at the C2-C6 levels efface the ventral thecal sac with at most mild canal stenosis. Uncinate spurring and facet  hypertrophic changes result in mild-to-moderate multilevel neural foraminal narrowing with slightly more severe narrowing on the right at C3-4 and C4-5. Upper chest: No acute abnormality in the upper chest or imaged lung apices. Other: Slightly heterogeneous enlarged thyroid with few punctate calcifications. IMPRESSION: 1. No acute intracranial  abnormality. No scalp swelling or calvarial fracture. 2. 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 3. Chronic microvascular angiopathic changes and parenchymal volume loss, somewhat progressed since comparison studies. 4. Comminuted, impacted and anteriorly displaced fracture of the right mandibular condyle. No other mandibular fracture identified. Associated pre mandibular soft tissue swelling which likely correlates with site of reported laceration. 5. No other facial bone fracture. 6. No acute fracture or traumatic listhesis of the cervical spine. 7. Multilevel intervertebral disc height loss and spondylitic changes detailed above. 8. Heterogeneous, nodular thyroid. If further evaluation is warranted on a clinical basis following discussion of patient's risk factors and comorbidities, consider follow-up ultrasound. This follows consensus guidelines: Managing Incidental Thyroid Nodules Detected on Imaging: White Paper of the ACR Incidental Thyroid Findings Committee. J Am Coll Radiol 2015; 12:143-150. and [facility] 3-tiered system for managing ITNs: J Am Coll Radiol. 2015; Feb;12(2): 143-50 Electronically Signed   By: Lovena Le M.D.   On: 05/03/2019 22:34    Assessment/Plan  Closed fracture of proximal end of left humerus, S/P Arthroplasty Pain seems Controlled on Tramadol Follow up with Ortho Start on Therapy S/p Right Madibular fracture On Liquid diet for now Is talking to dietician for her options Essential hypertension Continue on Norvasc and Microzide COPD Continue Spiriva and Proair Off her oxygen now Seizure disorder ([place]) Seems  Controlled on Lamictal and Kepprra Senile osteoporosis Not sure if she will  able to take her Fosamax But will continue for now Pneumonia of both lower lobes Finishing Omnicef Urinary retention Doing well on Flomax Restless Leg On Mirapex Hyperlipidemia Continue Statin Cognitive Impairment On Namenda MMSE was 30/30 in Neurology Office H/o Thyroid Nodule Will need follow up as outpatient  Family/ staff Communication:   Labs/tests ordered:CBC,CMP  Total time spent in this patient care encounter was  45_  minutes; greater than 50% of the visit spent counseling patient and staff, reviewing records , Labs and coordinating care for problems addressed at this encounter.

## 2019-05-17 NOTE — Anesthesia Postprocedure Evaluation (Signed)
Anesthesia Post Note  Patient: [name]  Procedure(s) Performed: REVERSE SHOULDER ARTHROPLASTY (Left Shoulder)     Patient location during evaluation: PACU Anesthesia Type: General Level of consciousness: awake and alert Pain management: pain level controlled Vital Signs Assessment: post-procedure vital signs reviewed and stable Respiratory status: spontaneous breathing, nonlabored ventilation, respiratory function stable and patient connected to nasal cannula oxygen Cardiovascular status: blood pressure returned to baseline and stable Postop Assessment: no apparent nausea or vomiting Anesthetic complications: no    Last Vitals:  Vitals:   05/13/19 0716 05/13/19 1348  BP:  109/78  Pulse:  91  Resp:    Temp:  36.9 C  SpO2: 95% 95%    Last Pain:  Vitals:   05/13/19 1700  TempSrc:   PainSc: Tyler Deis

## 2019-05-19 ENCOUNTER — Non-Acute Institutional Stay (SKILLED_NURSING_FACILITY): Payer: Medicare PPO | Admitting: Nurse Practitioner

## 2019-05-19 ENCOUNTER — Encounter: Payer: Self-pay | Admitting: Nurse Practitioner

## 2019-05-19 DIAGNOSIS — R55 Syncope and collapse: Secondary | ICD-10-CM | POA: Diagnosis not present

## 2019-05-19 DIAGNOSIS — Z87898 Personal history of other specified conditions: Secondary | ICD-10-CM

## 2019-05-19 DIAGNOSIS — R531 Weakness: Secondary | ICD-10-CM | POA: Insufficient documentation

## 2019-05-19 DIAGNOSIS — J438 Other emphysema: Secondary | ICD-10-CM

## 2019-05-19 DIAGNOSIS — G2581 Restless legs syndrome: Secondary | ICD-10-CM

## 2019-05-19 DIAGNOSIS — G40909 Epilepsy, unspecified, not intractable, without status epilepticus: Secondary | ICD-10-CM

## 2019-05-19 DIAGNOSIS — S02611D Fracture of condylar process of right mandible, subsequent encounter for fracture with routine healing: Secondary | ICD-10-CM

## 2019-05-19 DIAGNOSIS — S02609A Fracture of mandible, unspecified, initial encounter for closed fracture: Secondary | ICD-10-CM | POA: Insufficient documentation

## 2019-05-19 DIAGNOSIS — I10 Essential (primary) hypertension: Secondary | ICD-10-CM | POA: Diagnosis not present

## 2019-05-19 DIAGNOSIS — S42202S Unspecified fracture of upper end of left humerus, sequela: Secondary | ICD-10-CM

## 2019-05-19 DIAGNOSIS — R609 Edema, unspecified: Secondary | ICD-10-CM

## 2019-05-19 NOTE — Assessment & Plan Note (Addendum)
Blood pressure is controlled, decrease Amlodipine 2.5mg  qd for loose Bp control in setting of possible vasovagal events.

## 2019-05-19 NOTE — Assessment & Plan Note (Signed)
Stable, decreased air entry both lungs, continue Spiriva

## 2019-05-19 NOTE — Assessment & Plan Note (Signed)
Managed with Promipexole.

## 2019-05-19 NOTE — Assessment & Plan Note (Signed)
Foley in hospital, off Flomax due to possible vasovagal events, stated small amount of urination 2x/24 hours, denied lower abd pressures on my examination, will I&O x24 hours, may need I&O cath, post void residual check.

## 2019-05-19 NOTE — Assessment & Plan Note (Addendum)
Feeling slipping away when having BM x3, 2 out of 3 the events the patient has strained to have bowl movement. The event last a few minutes, resolved w/o intervention. No recollection of dizziness, change of vision, chest pain/pressure, palpitation, or focal weakness. The patient stated its not like her active seizure presentation in the past. No significant Bp changes. Will continue to observe, avoid constipation or straining when BM, adding Senokot S I qd, update CBC/diff, CMP/eGFR, decrease Amlodipine to 2.'5mg'$  qd for loose Bp control.

## 2019-05-19 NOTE — Assessment & Plan Note (Addendum)
Chronic, moderate, TED. Continue HCTZ

## 2019-05-19 NOTE — Assessment & Plan Note (Signed)
The left, surgical site is cover the dressing, left arm swelling is better, able to make a fist, denied tingling or numbness LUE/hand.

## 2019-05-19 NOTE — Assessment & Plan Note (Signed)
Stable, continue Lamotrigine, Keppra.

## 2019-05-19 NOTE — Assessment & Plan Note (Signed)
Right condyle process fx, only pain when open mouth side, the patient doesn't like pureed food.

## 2019-05-19 NOTE — Progress Notes (Addendum)
Location:   [facility] Room Number: 35 Place of Service:  SNF (31) Provider:  Meher Kucinski NP  Leighton Ruff, MD  Patient Care Team: Leighton Ruff, MD as PCP - General (Family Medicine) [name], MD as Consulting Physician (Ophthalmology) [name], MD as Consulting Physician (Cardiology) [name], MD as Consulting Physician (Dermatology) [name], MD as Consulting Physician (Orthopedic Surgery) [name], MD as Consulting Physician (Orthopedic Surgery) [name], MD as Consulting Physician (Orthopedic Surgery) [name], [place], Natalyn Szymanowski X, NP as Nurse Practitioner (Nurse Practitioner) [name], MD as Consulting Physician (Neurology)  Extended Emergency Contact Information Primary Emergency Contact: [name],[name] Address: [address]          [place] [address] [place] [employer] Phone: [phone] Relation: Sister Secondary Emergency Contact: [name] [place] Phone: [phone] Relation: Sister  Code Status:  DNR Goals of care: Advanced Directive information Advanced Directives 05/19/2019  Does Patient Have a Medical Advance Directive? Yes  Type of Advance Directive Out of facility DNR (pink MOST or yellow form)  Does patient want to make changes to medical advance directive? No - Patient declined  Copy of [place] in Chart? -  Pre-existing out of facility DNR order (yellow form or pink MOST form) Yellow form placed in chart (order not valid for inpatient use);Pink MOST form placed in chart (order not valid for inpatient use)     Chief Complaint  Patient presents with  . Acute Visit    Weakness    HPI:  Pt is a 86 y.o. female seen today for an acute visit for c/o slipping away sensation while toileing. Bp on commode 110/80, Bp 130/70 while in recliner today. Total events of "slipping sensation on commode" x3, stained while BM  2x/3, sounds like vasovagal. Urinated small amount x2 in the past 24 hours, no pressure sensation in lower abd, off Flomax, urinary retention while in hospital. Hx of HTN, blood pressure is controlled on Amlodipine '5mg'$  qd. BLE edema, on HCTZ 12.'5mg'$  qd. Seizure: on Lamotrigine '150mg'$  bid, Keppra '250mg'$  am, '500mg'$  pm. RLS: Promipexole 0.'25mg'$  qd. COPD, stable, on Spiriva qd    Past Medical History:  Diagnosis Date  . Acute bronchitis 05/23/2011  . Acute upper respiratory infections of unspecified site 05/23/2011  . Arthritis   . Chronic airway obstruction, not elsewhere classified 05/23/2011  . Disturbance of salivary secretion 01/31/2011  . Dizziness and giddiness 01/31/2011  . Essential tremor 04/25/2014  . External hemorrhoids without mention of complication 16/60/6301  . Gait disorder 04/25/2014  . Insomnia, unspecified 09/12/2011  . Lumbago 01/31/2011  . Major depressive disorder, single episode, unspecified 01/31/2011  . Memory disorder 04/25/2014  . Mitral valve disorders(424.0) 01/31/2011  . Other and unspecified hyperlipidemia 01/31/2011  . Other convulsions 01/31/2011  . Other emphysema ([place]) 01/31/2011  . Pain in joint, site unspecified 01/31/2011  . Restless legs syndrome (RLS) 09/12/2011  . Retinal detachment with retinal defect of right eye 2011   right eye twice  . Senile osteoporosis 01/31/2011  . Spontaneous ecchymoses 01/31/2011  . Stiffness of joints, not elsewhere classified, multiple sites 01/31/2011  . Unspecified essential hypertension 01/31/2011   Past Surgical History:  Procedure Laterality Date  . ABDOMINAL HYSTERECTOMY  06/21/2003   TAH/BSO, omenectomy PSB resect, Stg IC cystadenofibroma  . CHOLECYSTECTOMY  2005   Dr. Marlou Starks  . ELBOW SURGERY Right 2008   broken   Dr. Apolonio Schneiders  . EYE  SURGERY    . RETINAL DETACHMENT SURGERY N/A    two  . REVERSE SHOULDER ARTHROPLASTY Left 05/06/2019   Procedure: REVERSE SHOULDER ARTHROPLASTY;  Surgeon: Justice Britain, MD;  Location: [facility]  ORS;  Service: Orthopedics;  Laterality: Left;  177mn  . ROTATOR CUFF REPAIR Right 2012   Dr. CTheda Sers . SQUAMOUS CELL CARCINOMA EXCISION Bilateral 2012, 8/14   [place] on legs   Dr. GSarajane Jews . TONSILLECTOMY  1941  . VIDEO BRONCHOSCOPY WITH ENDOBRONCHIAL NAVIGATION N/A 11/29/2015   Procedure: VIDEO BRONCHOSCOPY WITH ENDOBRONCHIAL NAVIGATION;  Surgeon: RCollene Gobble MD;  Location: [facility] OR;  Service: Thoracic;  Laterality: N/A;    Allergies  Allergen Reactions  . Latex Other (See Comments)    Swelling  . Sulfa Antibiotics Nausea And Vomiting  . Tetracyclines & Related     Allergies as of 05/19/2019      Reactions   Latex Other (See Comments)   Swelling   Sulfa Antibiotics Nausea And Vomiting   Tetracyclines & Related       Medication List       Accurate as of May 19, 2019 11:55 AM. If you have any questions, ask your nurse or doctor.        STOP taking these medications   cefdinir 300 MG capsule Commonly known as: OMNICEF Stopped by: Garvis Downum X Makayleigh Poliquin, NP   tamsulosin 0.4 MG Caps capsule Commonly known as: FLOMAX Stopped by: Klare Criss X Glessie Eustice, NP     TAKE these medications   alendronate 70 MG tablet Commonly known as: FOSAMAX Take 70 mg by mouth once a week.   amLODipine 5 MG tablet Commonly known as: NORVASC Take 1 tablet (5 mg total) by mouth daily.   aspirin 81 MG tablet Take 81 mg by mouth daily.   cholecalciferol 1000 units tablet Commonly known as: VITAMIN D Take 1 tablet (1,000 Units total) by mouth daily.   hydrochlorothiazide 12.5 MG capsule Commonly known as: MICROZIDE Take 12.5 mg by mouth daily.   hydrocortisone 2.5 % cream Apply 1 application topically as needed (for psoriasis).   ketoconazole 2 % cream Commonly known as: NIZORAL Apply 1 application topically as needed (for psoriasis).   lamoTRIgine 150 MG tablet Commonly known as: LAMICTAL TAKE 1 TABLET BY MOUTH TWICE DAILY.   levETIRAcetam 500 MG tablet Commonly known as: KEPPRA TAKE 1/2  TABLET IN THE AM AND 1 TABLET AT BEDTIME. What changed: See the new instructions.   memantine 10 MG tablet Commonly known as: NAMENDA TAKE 1 TABLET BY MOUTH TWICE DAILY.   pramipexole 0.25 MG tablet Commonly known as: MIRAPEX Take 1 tablet (0.25 mg total) by mouth daily.   ProAir HFA 108 (90 Base) MCG/ACT inhaler Generic drug: albuterol 2 PUFFS INTO THE LUNGS EVERY 4 HOURS IF NEEDED FOR WHEEZING/SHORTNESSOF BREATH. What changed: See the new instructions.   simvastatin 10 MG tablet Commonly known as: ZOCOR Take 10 mg by mouth daily. Reported on 06/27/2015   tiotropium 18 MCG inhalation capsule Commonly known as: Spiriva HandiHaler INHALE CONTENTS OF ONE CAPSULE ONCE DAILY FOR COPD. What changed:   how much to take  how to take this  when to take this  additional instructions   traMADol 50 MG tablet Commonly known as: ULTRAM Take 1 tablet (50 mg total) by mouth every 6 (six) hours as needed for moderate pain.   Tubersol 5 UNIT/0.1ML injection Generic drug: tuberculin Inject into the skin once. Start taking on: May 25, 2019  ROS was provided with assistance of staff.  Review of Systems  Constitutional: Negative for activity change, appetite change, chills, diaphoresis, fatigue and fever.  HENT: Positive for hearing loss. Negative for congestion, trouble swallowing and voice change.   Eyes: Positive for visual disturbance.  Respiratory: Positive for shortness of breath. Negative for cough and wheezing.        DOE  Cardiovascular: Positive for leg swelling. Negative for chest pain and palpitations.  Gastrointestinal: Negative for abdominal distention, abdominal pain, constipation, diarrhea, nausea and vomiting.  Genitourinary: Negative for difficulty urinating, dysuria and urgency.  Musculoskeletal: Positive for arthralgias, gait problem and joint swelling.       Right jaw pain when open mouth wide. Left shoulder swelling and pain with movement.   Skin:  Positive for wound.       Left shoulder surgical wound.   Neurological: Positive for seizures, syncope and light-headedness. Negative for dizziness, tremors, facial asymmetry, speech difficulty, weakness, numbness and headaches.       Slipping away sensation while on commode x3 in the past-near syncope? No active seizures. Slightly memory lapses.   Psychiatric/Behavioral: Negative for agitation, behavioral problems, hallucinations and sleep disturbance. The patient is not nervous/anxious.     Immunization History  Administered Date(s) Administered  . Influenza Split 01/06/2014  . Influenza Whole 01/07/2012, 01/06/2013  . Influenza, High Dose Seasonal PF 12/25/2015, 01/20/2017  . Influenza,inj,Quad PF,6+ Mos 12/21/2014  . Influenza-Unspecified 12/09/2018  . Pneumococcal Conjugate-13 02/01/2014  . Pneumococcal Polysaccharide-23 04/08/2004  . Td 04/08/2002  . Tdap 04/09/2011  . Zoster 04/08/2008  . Zoster Recombinat (Shingrix) 08/27/2017   Pertinent  Health Maintenance Due  Topic Date Due  . MAMMOGRAM  06/14/2015  . INFLUENZA VACCINE  Completed  . DEXA SCAN  Completed  . PNA vac Low Risk Adult  Completed   Fall Risk  03/24/2014 10/15/2012  Falls in the past year? No No   Functional Status Survey:    Vitals:   05/19/19 1033  BP: 120/80  Pulse: 92  Resp: 18  Temp: (!) 97.1 F (36.2 C)  SpO2: 94%  Weight: 173 lb (78.5 kg)  Height: 5' 5.5" (1.664 m)   Body mass index is 28.35 kg/m. Physical Exam Vitals and nursing note reviewed.  Constitutional:      General: She is not in acute distress.    Appearance: Normal appearance. She is not ill-appearing, toxic-appearing or diaphoretic.  HENT:     Head: Normocephalic and atraumatic.     Nose: Nose normal.     Mouth/Throat:     Mouth: Mucous membranes are moist.  Eyes:     Extraocular Movements: Extraocular movements intact.     Conjunctiva/sclera: Conjunctivae normal.     Pupils: Pupils are equal, round, and reactive to  light.  Cardiovascular:     Rate and Rhythm: Normal rate and regular rhythm.     Heart sounds: Murmur present.  Pulmonary:     Breath sounds: No wheezing, rhonchi or rales.     Comments: Decreased air entry to both lungs.  Abdominal:     General: Bowel sounds are normal. There is no distension.     Palpations: Abdomen is soft.     Tenderness: There is no abdominal tenderness. There is no right CVA tenderness, left CVA tenderness, guarding or rebound.  Musculoskeletal:     Cervical back: Normal range of motion and neck supple. No rigidity or tenderness.     Right lower leg: Edema present.     Left  lower leg: Edema present.     Comments: Moderate edema BLE. Right jaw pain when open mouth wide. Left shoulder swelling and pain with movement.    Skin:    General: Skin is warm and dry.     Comments: Left surgical wound is covered in dressing during my examination. Swelling noted, no warmth or heat in the area.   Neurological:     General: No focal deficit present.     Mental Status: She is alert and oriented to person, place, and time. Mental status is at baseline.     Motor: No weakness.     Coordination: Coordination normal.     Gait: Gait abnormal.  Psychiatric:        Mood and Affect: Mood normal.        Behavior: Behavior normal.        Thought Content: Thought content normal.        Judgment: Judgment normal.     Labs reviewed: Recent Labs    05/08/19 0327 05/08/19 0327 05/09/19 0338 05/09/19 0338 05/10/19 0341 05/12/19 0334 05/13/19 0403  NA 138   < > 138   < > 139 139 138  K 3.6   < > 3.3*   < > 3.5 3.3* 3.6  CL 103   < > 100   < > 104 104 105  CO2 26   < > 30   < > '28 25 24  '$ GLUCOSE 106*   < > 104*   < > 99 97 101*  BUN 17   < > 15   < > '13 19 16  '$ CREATININE 0.48   < > 0.52   < > 0.55 0.62 0.60  CALCIUM 8.3*   < > 8.4*   < > 8.7* 8.7* 8.4*  MG 1.8  --  1.8  --  2.0  --   --   PHOS 2.1*  --  3.3  --  3.0  --   --    < > = values in this interval not displayed.     Recent Labs    05/08/19 0327 05/09/19 0338 05/10/19 0341  AST '29 26 25  '$ ALT '23 22 21  '$ ALKPHOS 106 97 91  BILITOT 1.5* 1.1 1.4*  PROT 5.7* 5.6* 5.5*  ALBUMIN 3.1* 2.9* 2.9*   Recent Labs    06/16/18 1056 05/04/19 0025 05/04/19 0449 05/11/19 0422 05/12/19 0334 05/13/19 0403  WBC 6.0 13.3*   < > 7.6 7.4 8.3  NEUTROABS 3.7 11.3*  --   --   --   --   HGB 15.1 13.6   < > 10.8* 10.8* 11.3*  HCT 44.8 41.6   < > 33.4* 33.6* 36.2  MCV 89 93.7   < > 95.4 95.2 97.1  PLT 218 198   < > 220 247 275   < > = values in this interval not displayed.   Lab Results  Component Value Date   TSH 1.51 02/28/2016   No results found for: HGBA1C Lab Results  Component Value Date   CHOL 249 (H) 06/27/2015   HDL 70 06/27/2015   LDLCALC 160 (H) 06/27/2015   TRIG 97 06/27/2015   CHOLHDL 3.6 06/27/2015    Significant Diagnostic Results in last 30 days:  DG Chest 2 View  Result Date: 05/03/2019 CLINICAL DATA:  Chest pain after fall EXAM: CHEST - 2 VIEW COMPARISON:  11/29/2015 FINDINGS: Low lung volumes with linear scarring or atelectasis at the bases. Stable cardiomediastinal  silhouette with aortic atherosclerosis. No pneumothorax. Displaced fracture involving the proximal left humerus. IMPRESSION: 1. Low lung volumes with atelectasis or scarring at the bases 2. Displaced proximal left humerus fracture Electronically Signed   By: Donavan Foil M.D.   On: 05/03/2019 22:02   DG Abd 1 View  Result Date: 05/09/2019 CLINICAL DATA:  Abdominal bloating. EXAM: ABDOMEN - 1 VIEW COMPARISON:  None. FINDINGS: The patient's left hand overlies left side of the abdomen and could not be moved due to recent shoulder surgery. There appears to be some fecal loading in the distal colon/rectum. No evidence of bowel obstruction. Calcifications in the right upper quadrant could represent renal stones. No other acute abnormalities. IMPRESSION: Fecal loading in the rectum and sigmoid colon. No bowel obstruction noted.  Calcifications in the right upper quadrant may represent renal stones. Electronically Signed   By: Dorise Bullion III M.D   On: 05/09/2019 11:29   CT Head Wo Contrast  Result Date: 05/03/2019 CLINICAL DATA:  Fall, facial lacerations EXAM: CT HEAD WITHOUT CONTRAST CT MAXILLOFACIAL WITHOUT CONTRAST CT CERVICAL SPINE WITHOUT CONTRAST TECHNIQUE: Multidetector CT imaging of the head, cervical spine, and maxillofacial structures were performed using the standard protocol without intravenous contrast. Multiplanar CT image reconstructions of the cervical spine and maxillofacial structures were also generated. COMPARISON:  MR head 12/08/2012, CT head 08/16/2009 FINDINGS: CT HEAD FINDINGS Brain: Punctate foci of hypoattenuation the bilateral basal ganglia likely reflect sequela of remote lacunar infarcts several of which were present on comparison CT from 20/11. There mixed patchy and confluent areas of hypoattenuation throughout the subcortical deep and periventricular white matter, similar to slightly advanced from comparison in most compatible with chronic microvascular angiopathy. Symmetric prominence of the ventricles, cisterns and sulci compatible with parenchymal volume loss. Slight ex vacuo dilatation of the ventricles. No convincing evidence of acute infarction, hemorrhage, frank hydrocephalus, extra-axial collection or mass lesion/mass effect. Vascular: Atherosclerotic calcification of the carotid siphons. No hyperdense vessel. Skull: No calvarial fracture, scalp swelling or hematoma. Other: None CT MAXILLOFACIAL FINDINGS Osseous: The osseous structures appear diffusely demineralized which may limit detection of small or nondisplaced fractures. No fracture of the bony orbits. Nasal bones are intact. No mid face fractures are seen. The pterygoid plates are intact. There is a comminuted fracture of the right mandibular condyle which appears impacted and slightly anteriorly displaced relative to the contralateral  side. No other mandibular fracture is identified. The left temporomandibular joint is more normally aligned. 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 No temporal bone fractures are identified. There is complete absence of the mandibular dentition. Maxillary dental bridge is noted with several dental implants. Orbits: The globes appear normal and symmetric with evidence of prior bilateral lens extractions. Symmetric appearance of the extraocular musculature and optic nerve sheath complexes. Normal caliber of the superior ophthalmic veins. Sinuses: Minimal thickening in the maxillary and ethmoidal sinuses. No air-fluid levels. Mastoid air cells are well aerated, better seen on CT images head. Middle ear cavities are clear. Ossicular chains appear normally configured. Soft tissues: Some pre mandibular soft tissue swelling and thickening is noted, compatible with the reported site of laceration. No other acute soft tissue abnormality is seen. CT CERVICAL SPINE FINDINGS Alignment: Slight exaggeration of the normal cervical lordosis is likely positional. Stabilization collar is not visualized at the time of exam. There is narrowing of the basion dens interval with developing and no a pseudoarticulation which is a variant finding in can be seen in degenerative change. There is  slight stepwise retrolisthesis of C3-C5 on a likely degenerative basis given spondylitic and facet changes at the involved levels. No traumatic listhesis. No abnormally widened, perched or jumped facets. Skull base and vertebrae: No acute fracture. No primary bone lesion or focal pathologic process. Soft tissues and spinal canal: No pre or paravertebral fluid or swelling. No visible canal hematoma. Disc levels: Multilevel intervertebral disc height loss with spondylitic endplate changes. Disc osteophyte complexes at the C2-C6 levels efface the ventral thecal sac with at most mild canal stenosis. Uncinate spurring and  facet hypertrophic changes result in mild-to-moderate multilevel neural foraminal narrowing with slightly more severe narrowing on the right at C3-4 and C4-5. Upper chest: No acute abnormality in the upper chest or imaged lung apices. Other: Slightly heterogeneous enlarged thyroid with few punctate calcifications. IMPRESSION: 1. No acute intracranial abnormality. No scalp swelling or calvarial fracture. 2. 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 3. Chronic microvascular angiopathic changes and parenchymal volume loss, somewhat progressed since comparison studies. 4. Comminuted, impacted and anteriorly displaced fracture of the right mandibular condyle. No other mandibular fracture identified. Associated pre mandibular soft tissue swelling which likely correlates with site of reported laceration. 5. No other facial bone fracture. 6. No acute fracture or traumatic listhesis of the cervical spine. 7. Multilevel intervertebral disc height loss and spondylitic changes detailed above. 8. Heterogeneous, nodular thyroid. If further evaluation is warranted on a clinical basis following discussion of patient's risk factors and comorbidities, consider follow-up ultrasound. This follows consensus guidelines: Managing Incidental Thyroid Nodules Detected on Imaging: White Paper of the ACR Incidental Thyroid Findings Committee. J Am Coll Radiol 2015; 12:143-150. and [facility] 3-tiered system for managing ITNs: J Am Coll Radiol. 2015; Feb;12(2): 143-50 Electronically Signed   By: Lovena Le M.D.   On: 05/03/2019 22:34   CT Cervical Spine Wo Contrast  Result Date: 05/03/2019 CLINICAL DATA:  Fall, facial lacerations EXAM: CT HEAD WITHOUT CONTRAST CT MAXILLOFACIAL WITHOUT CONTRAST CT CERVICAL SPINE WITHOUT CONTRAST TECHNIQUE: Multidetector CT imaging of the head, cervical spine, and maxillofacial structures were performed using the standard protocol without intravenous contrast. Multiplanar CT image reconstructions of  the cervical spine and maxillofacial structures were also generated. COMPARISON:  MR head 12/08/2012, CT head 08/16/2009 FINDINGS: CT HEAD FINDINGS Brain: Punctate foci of hypoattenuation the bilateral basal ganglia likely reflect sequela of remote lacunar infarcts several of which were present on comparison CT from 20/11. There mixed patchy and confluent areas of hypoattenuation throughout the subcortical deep and periventricular white matter, similar to slightly advanced from comparison in most compatible with chronic microvascular angiopathy. Symmetric prominence of the ventricles, cisterns and sulci compatible with parenchymal volume loss. Slight ex vacuo dilatation of the ventricles. No convincing evidence of acute infarction, hemorrhage, frank hydrocephalus, extra-axial collection or mass lesion/mass effect. Vascular: Atherosclerotic calcification of the carotid siphons. No hyperdense vessel. Skull: No calvarial fracture, scalp swelling or hematoma. Other: None CT MAXILLOFACIAL FINDINGS Osseous: The osseous structures appear diffusely demineralized which may limit detection of small or nondisplaced fractures. No fracture of the bony orbits. Nasal bones are intact. No mid face fractures are seen. The pterygoid plates are intact. There is a comminuted fracture of the right mandibular condyle which appears impacted and slightly anteriorly displaced relative to the contralateral side. No other mandibular fracture is identified. The left temporomandibular joint is more normally aligned. 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 No temporal bone fractures are identified. There is complete absence of the mandibular dentition. Maxillary dental bridge  is noted with several dental implants. Orbits: The globes appear normal and symmetric with evidence of prior bilateral lens extractions. Symmetric appearance of the extraocular musculature and optic nerve sheath complexes. Normal caliber of  the superior ophthalmic veins. Sinuses: Minimal thickening in the maxillary and ethmoidal sinuses. No air-fluid levels. Mastoid air cells are well aerated, better seen on CT images head. Middle ear cavities are clear. Ossicular chains appear normally configured. Soft tissues: Some pre mandibular soft tissue swelling and thickening is noted, compatible with the reported site of laceration. No other acute soft tissue abnormality is seen. CT CERVICAL SPINE FINDINGS Alignment: Slight exaggeration of the normal cervical lordosis is likely positional. Stabilization collar is not visualized at the time of exam. There is narrowing of the basion dens interval with developing and no a pseudoarticulation which is a variant finding in can be seen in degenerative change. There is slight stepwise retrolisthesis of C3-C5 on a likely degenerative basis given spondylitic and facet changes at the involved levels. No traumatic listhesis. No abnormally widened, perched or jumped facets. Skull base and vertebrae: No acute fracture. No primary bone lesion or focal pathologic process. Soft tissues and spinal canal: No pre or paravertebral fluid or swelling. No visible canal hematoma. Disc levels: Multilevel intervertebral disc height loss with spondylitic endplate changes. Disc osteophyte complexes at the C2-C6 levels efface the ventral thecal sac with at most mild canal stenosis. Uncinate spurring and facet hypertrophic changes result in mild-to-moderate multilevel neural foraminal narrowing with slightly more severe narrowing on the right at C3-4 and C4-5. Upper chest: No acute abnormality in the upper chest or imaged lung apices. Other: Slightly heterogeneous enlarged thyroid with few punctate calcifications. IMPRESSION: 1. No acute intracranial abnormality. No scalp swelling or calvarial fracture. 2. 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 3. Chronic microvascular angiopathic changes and parenchymal volume loss,  somewhat progressed since comparison studies. 4. Comminuted, impacted and anteriorly displaced fracture of the right mandibular condyle. No other mandibular fracture identified. Associated pre mandibular soft tissue swelling which likely correlates with site of reported laceration. 5. No other facial bone fracture. 6. No acute fracture or traumatic listhesis of the cervical spine. 7. Multilevel intervertebral disc height loss and spondylitic changes detailed above. 8. Heterogeneous, nodular thyroid. If further evaluation is warranted on a clinical basis following discussion of patient's risk factors and comorbidities, consider follow-up ultrasound. This follows consensus guidelines: Managing Incidental Thyroid Nodules Detected on Imaging: White Paper of the ACR Incidental Thyroid Findings Committee. J Am Coll Radiol 2015; 12:143-150. and [facility] 3-tiered system for managing ITNs: J Am Coll Radiol. 2015; Feb;12(2): 143-50 Electronically Signed   By: Lovena Le M.D.   On: 05/03/2019 22:34   DG CHEST PORT 1 VIEW  Result Date: 05/10/2019 CLINICAL DATA:  Worsening oxygen saturation. EXAM: PORTABLE CHEST 1 VIEW COMPARISON:  05/03/2019 FINDINGS: Worsening atelectasis in both lower lobes, worse on the left than the right. There could be developing pleural effusions. Upper lungs appear clear. IMPRESSION: Worsening atelectasis in both lower lobes, left more than right. Possible developing effusions. Electronically Signed   By: Nelson Chimes M.D.   On: 05/10/2019 10:52   DG Shoulder Left  Result Date: 05/03/2019 CLINICAL DATA:  Fall with shoulder pain and deformity EXAM: LEFT SHOULDER - 2+ VIEW COMPARISON:  None. FINDINGS: AC joint is intact. Acute comminuted humeral neck fracture with close to 1 shaft diameter of displacement for the midline. Moderate valgus angulation of distal fracture fragment. Mildly displaced greater tuberosity fracture fragment.  IMPRESSION: Acute displaced and angulated proximal humerus fracture  involving the humeral neck and greater tuberosity. Electronically Signed   By: Donavan Foil M.D.   On: 05/03/2019 22:04   CT Maxillofacial WO CM  Result Date: 05/03/2019 CLINICAL DATA:  Fall, facial lacerations EXAM: CT HEAD WITHOUT CONTRAST CT MAXILLOFACIAL WITHOUT CONTRAST CT CERVICAL SPINE WITHOUT CONTRAST TECHNIQUE: Multidetector CT imaging of the head, cervical spine, and maxillofacial structures were performed using the standard protocol without intravenous contrast. Multiplanar CT image reconstructions of the cervical spine and maxillofacial structures were also generated. COMPARISON:  MR head 12/08/2012, CT head 08/16/2009 FINDINGS: CT HEAD FINDINGS Brain: Punctate foci of hypoattenuation the bilateral basal ganglia likely reflect sequela of remote lacunar infarcts several of which were present on comparison CT from 20/11. There mixed patchy and confluent areas of hypoattenuation throughout the subcortical deep and periventricular white matter, similar to slightly advanced from comparison in most compatible with chronic microvascular angiopathy. Symmetric prominence of the ventricles, cisterns and sulci compatible with parenchymal volume loss. Slight ex vacuo dilatation of the ventricles. No convincing evidence of acute infarction, hemorrhage, frank hydrocephalus, extra-axial collection or mass lesion/mass effect. Vascular: Atherosclerotic calcification of the carotid siphons. No hyperdense vessel. Skull: No calvarial fracture, scalp swelling or hematoma. Other: None CT MAXILLOFACIAL FINDINGS Osseous: The osseous structures appear diffusely demineralized which may limit detection of small or nondisplaced fractures. No fracture of the bony orbits. Nasal bones are intact. No mid face fractures are seen. The pterygoid plates are intact. There is a comminuted fracture of the right mandibular condyle which appears impacted and slightly anteriorly displaced relative to the contralateral side. No other  mandibular fracture is identified. The left temporomandibular joint is more normally aligned. 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 No temporal bone fractures are identified. There is complete absence of the mandibular dentition. Maxillary dental bridge is noted with several dental implants. Orbits: The globes appear normal and symmetric with evidence of prior bilateral lens extractions. Symmetric appearance of the extraocular musculature and optic nerve sheath complexes. Normal caliber of the superior ophthalmic veins. Sinuses: Minimal thickening in the maxillary and ethmoidal sinuses. No air-fluid levels. Mastoid air cells are well aerated, better seen on CT images head. Middle ear cavities are clear. Ossicular chains appear normally configured. Soft tissues: Some pre mandibular soft tissue swelling and thickening is noted, compatible with the reported site of laceration. No other acute soft tissue abnormality is seen. CT CERVICAL SPINE FINDINGS Alignment: Slight exaggeration of the normal cervical lordosis is likely positional. Stabilization collar is not visualized at the time of exam. There is narrowing of the basion dens interval with developing and no a pseudoarticulation which is a variant finding in can be seen in degenerative change. There is slight stepwise retrolisthesis of C3-C5 on a likely degenerative basis given spondylitic and facet changes at the involved levels. No traumatic listhesis. No abnormally widened, perched or jumped facets. Skull base and vertebrae: No acute fracture. No primary bone lesion or focal pathologic process. Soft tissues and spinal canal: No pre or paravertebral fluid or swelling. No visible canal hematoma. Disc levels: Multilevel intervertebral disc height loss with spondylitic endplate changes. Disc osteophyte complexes at the C2-C6 levels efface the ventral thecal sac with at most mild canal stenosis. Uncinate spurring and facet  hypertrophic changes result in mild-to-moderate multilevel neural foraminal narrowing with slightly more severe narrowing on the right at C3-4 and C4-5. Upper chest: No acute abnormality in the upper chest or imaged lung apices.  Other: Slightly heterogeneous enlarged thyroid with few punctate calcifications. IMPRESSION: 1. No acute intracranial abnormality. No scalp swelling or calvarial fracture. 2. 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 3. Chronic microvascular angiopathic changes and parenchymal volume loss, somewhat progressed since comparison studies. 4. Comminuted, impacted and anteriorly displaced fracture of the right mandibular condyle. No other mandibular fracture identified. Associated pre mandibular soft tissue swelling which likely correlates with site of reported laceration. 5. No other facial bone fracture. 6. No acute fracture or traumatic listhesis of the cervical spine. 7. Multilevel intervertebral disc height loss and spondylitic changes detailed above. 8. Heterogeneous, nodular thyroid. If further evaluation is warranted on a clinical basis following discussion of patient's risk factors and comorbidities, consider follow-up ultrasound. This follows consensus guidelines: Managing Incidental Thyroid Nodules Detected on Imaging: White Paper of the ACR Incidental Thyroid Findings Committee. J Am Coll Radiol 2015; 12:143-150. and [facility] 3-tiered system for managing ITNs: J Am Coll Radiol. 2015; Feb;12(2): 143-50 Electronically Signed   By: Lovena Le M.D.   On: 05/03/2019 22:34    Assessment/Plan Vasovagal near syncope Feeling slipping away when having BM x3, 2 out of 3 the events the patient has strained to have bowl movement. The event last a few minutes, resolved w/o intervention. No recollection of dizziness, change of vision, chest pain/pressure, palpitation, or focal weakness. The patient stated its not like her active seizure presentation in the past. No significant Bp  changes. Will continue to observe, avoid constipation or straining when BM, adding Senokot S I qd, update CBC/diff, CMP/eGFR, decrease Amlodipine to 2.'5mg'$  qd for loose Bp control.   Seizure disorder Stable, continue Lamotrigine, Keppra.   COPD (chronic obstructive pulmonary disease) (HCC) Stable, decreased air entry both lungs, continue Spiriva  Essential hypertension Blood pressure is controlled, decrease Amlodipine 2.'5mg'$  qd for loose Bp control in setting of possible vasovagal events.   Humerus fracture The left, surgical site is cover the dressing, left arm swelling is better, able to make a fist, denied tingling or numbness LUE/hand.   Edema Chronic, moderate, TED. Continue HCTZ  Restless legs syndrome (RLS) Managed with Promipexole.   History of urinary retention Foley in hospital, off Flomax due to possible vasovagal events, stated small amount of urination 2x/24 hours, denied lower abd pressures on my examination, will I&O x24 hours, may need I&O cath, post void residual check.   Mandibular fracture, closed ([place]) Right condyle process fx, only pain when open mouth side, the patient doesn't like pureed food.      Family/ staff Communication: plan of care reviewed with the patient and charge nurse.   Labs/tests ordered:  CBC/diff, CMP/eGFR  Time spend 25 minutes.

## 2019-05-20 LAB — BASIC METABOLIC PANEL
BUN: 14 (ref 4–21)
CO2: 30 — AB (ref 13–22)
Chloride: 102 (ref 99–108)
Creatinine: 0.7 (ref 0.5–1.1)
Glucose: 90
Potassium: 3.4 (ref 3.4–5.3)
Sodium: 140 (ref 137–147)

## 2019-05-20 LAB — CBC AND DIFFERENTIAL
HCT: 36 (ref 36–46)
Hemoglobin: 12.1 (ref 12.0–16.0)
Neutrophils Absolute: 4260
Platelets: 364 (ref 150–399)
WBC: 5.9

## 2019-05-20 LAB — HEPATIC FUNCTION PANEL
ALT: 15 (ref 7–35)
AST: 16 (ref 13–35)
Alkaline Phosphatase: 254 — AB (ref 25–125)
Bilirubin, Total: 0.7

## 2019-05-20 LAB — COMPREHENSIVE METABOLIC PANEL
Albumin: 3.5 (ref 3.5–5.0)
Calcium: 8.9 (ref 8.7–10.7)
Globulin: 2.3

## 2019-05-20 LAB — CBC: RBC: 3.97 (ref 3.87–5.11)

## 2019-05-21 ENCOUNTER — Encounter: Payer: Self-pay | Admitting: Nurse Practitioner

## 2019-05-21 ENCOUNTER — Non-Acute Institutional Stay (SKILLED_NURSING_FACILITY): Payer: Medicare PPO | Admitting: Nurse Practitioner

## 2019-05-21 DIAGNOSIS — R609 Edema, unspecified: Secondary | ICD-10-CM

## 2019-05-21 DIAGNOSIS — R413 Other amnesia: Secondary | ICD-10-CM

## 2019-05-21 DIAGNOSIS — K5901 Slow transit constipation: Secondary | ICD-10-CM

## 2019-05-21 DIAGNOSIS — G40909 Epilepsy, unspecified, not intractable, without status epilepticus: Secondary | ICD-10-CM

## 2019-05-21 DIAGNOSIS — I10 Essential (primary) hypertension: Secondary | ICD-10-CM | POA: Diagnosis not present

## 2019-05-21 NOTE — Progress Notes (Signed)
Location:   [facility] Room Number: 35 Place of Service:  SNF (31) Provider:  Mast, Man NP  Leighton Ruff, MD  Patient Care Team: Leighton Ruff, MD as PCP - General (Family Medicine) [name], MD as Consulting Physician (Ophthalmology) [name], MD as Consulting Physician (Cardiology) [name], MD as Consulting Physician (Dermatology) [name], MD as Consulting Physician (Orthopedic Surgery) [name], MD as Consulting Physician (Orthopedic Surgery) [name], MD as Consulting Physician (Orthopedic Surgery) [name], Clarkson, Man X, NP as Nurse Practitioner (Nurse Practitioner) [name], MD as Consulting Physician (Neurology)  Extended Emergency Contact Information Primary Emergency Contact: [name],[name] Address: [address]          [place] [address] [place] Phone: [phone] Relation: Sister Secondary Emergency Contact: [name] [place] Phone: [phone] Relation: Sister  Code Status:  DNR Goals of care: Advanced Directive information Advanced Directives 05/21/2019  Does Patient Have a Medical Advance Directive? Yes  Type of Advance Directive Out of facility DNR (pink MOST or yellow form)  Does patient want to make changes to medical advance directive? No - Patient declined  Copy of [place] in Chart? -  Pre-existing out of facility DNR order (yellow form or pink MOST form) Yellow form placed in chart (order not valid for inpatient use);Pink MOST form placed in chart (order not valid for inpatient use)     Chief Complaint  Patient presents with  . Acute Visit    Swelling in legs    HPI:  Pt is a 86 y.o. female seen today for an acute visit for    Past Medical History:  Diagnosis Date  . Acute bronchitis 05/23/2011  . Acute upper respiratory infections of unspecified site 05/23/2011  . Arthritis     . Chronic airway obstruction, not elsewhere classified 05/23/2011  . Disturbance of salivary secretion 01/31/2011  . Dizziness and giddiness 01/31/2011  . Essential tremor 04/25/2014  . External hemorrhoids without mention of complication [id number]  . Gait disorder 04/25/2014  . Insomnia, unspecified 09/12/2011  . Lumbago 01/31/2011  . Major depressive disorder, single episode, unspecified 01/31/2011  . Memory disorder 04/25/2014  . Mitral valve disorders(424.0) 01/31/2011  . Other and unspecified hyperlipidemia 01/31/2011  . Other convulsions 01/31/2011  . Other emphysema ([place]) 01/31/2011  . Pain in joint, site unspecified 01/31/2011  . Restless legs syndrome (RLS) 09/12/2011  . Retinal detachment with retinal defect of right eye 2011   right eye twice  . Senile osteoporosis 01/31/2011  . Spontaneous ecchymoses 01/31/2011  . Stiffness of joints, not elsewhere classified, multiple sites 01/31/2011  . Unspecified essential hypertension 01/31/2011   Past Surgical History:  Procedure Laterality Date  . ABDOMINAL HYSTERECTOMY  06/21/2003   TAH/BSO, omenectomy PSB resect, Stg IC cystadenofibroma  . CHOLECYSTECTOMY  2005   Dr. Marlou Starks  . ELBOW SURGERY Right 2008   broken   Dr. Apolonio Schneiders  . EYE SURGERY    . RETINAL DETACHMENT SURGERY N/A    two  . REVERSE SHOULDER ARTHROPLASTY Left 05/06/2019   Procedure: REVERSE SHOULDER ARTHROPLASTY;  Surgeon: Justice Britain, MD;  Location: [facility];  Service: Orthopedics;  Laterality: Left;  185min  . ROTATOR CUFF REPAIR Right 2012   Dr. Theda Sers  . SQUAMOUS CELL CARCINOMA EXCISION Bilateral 2012, 8/14   [place] on legs   Dr. Sarajane Jews  . TONSILLECTOMY  1941  . VIDEO BRONCHOSCOPY WITH ENDOBRONCHIAL NAVIGATION N/A 11/29/2015  Procedure: VIDEO BRONCHOSCOPY WITH ENDOBRONCHIAL NAVIGATION;  Surgeon: Collene Gobble, MD;  Location: [facility] OR;  Service: Thoracic;  Laterality: N/A;    Allergies  Allergen Reactions  . Latex Other (See Comments)    Swelling  . Sulfa  Antibiotics Nausea And Vomiting  . Tetracyclines & Related     Allergies as of 05/21/2019      Reactions   Latex Other (See Comments)   Swelling   Sulfa Antibiotics Nausea And Vomiting   Tetracyclines & Related       Medication List       Accurate as of May 21, 2019  3:20 PM. If you have any questions, ask your nurse or doctor.        STOP taking these medications   amLODipine 5 MG tablet Commonly known as: NORVASC Stopped by: Man X Mast, NP     TAKE these medications   acetaminophen 325 MG tablet Commonly known as: TYLENOL Take 650 mg by mouth every 4 (four) hours as needed.   alendronate 70 MG tablet Commonly known as: FOSAMAX Take 70 mg by mouth once a week.   aspirin 81 MG tablet Take 81 mg by mouth daily.   cholecalciferol 1000 units tablet Commonly known as: VITAMIN D Take 1 tablet (1,000 Units total) by mouth daily.   hydrochlorothiazide 12.5 MG capsule Commonly known as: MICROZIDE Take 12.5 mg by mouth daily.   hydrocortisone 2.5 % cream Apply 1 application topically as needed (for psoriasis).   ketoconazole 2 % cream Commonly known as: NIZORAL Apply 1 application topically as needed (for psoriasis).   lamoTRIgine 150 MG tablet Commonly known as: LAMICTAL TAKE 1 TABLET BY MOUTH TWICE DAILY.   levETIRAcetam 500 MG tablet Commonly known as: KEPPRA TAKE 1/2 TABLET IN THE AM AND 1 TABLET AT BEDTIME.   memantine 10 MG tablet Commonly known as: NAMENDA TAKE 1 TABLET BY MOUTH TWICE DAILY.   potassium chloride SA 20 MEQ tablet Commonly known as: KLOR-CON Take 20 mEq by mouth daily.   pramipexole 0.25 MG tablet Commonly known as: MIRAPEX Take 0.25 mg by mouth at bedtime. What changed: Another medication with the same name was removed. Continue taking this medication, and follow the directions you see here. Changed by: Man X Mast, NP   ProAir HFA 108 (90 Base) MCG/ACT inhaler Generic drug: albuterol 2 PUFFS INTO THE LUNGS EVERY 4 HOURS IF  NEEDED FOR WHEEZING/SHORTNESSOF BREATH.   sennosides-docusate sodium 8.6-50 MG tablet Commonly known as: SENOKOT-S Take 1 tablet by mouth daily.   simvastatin 10 MG tablet Commonly known as: ZOCOR Take 10 mg by mouth daily. Reported on 06/27/2015   tiotropium 18 MCG inhalation capsule Commonly known as: Spiriva HandiHaler INHALE CONTENTS OF ONE CAPSULE ONCE DAILY FOR COPD.   traMADol 50 MG tablet Commonly known as: ULTRAM Take 1 tablet (50 mg total) by mouth every 6 (six) hours as needed for moderate pain.   Tubersol 5 UNIT/0.1ML injection Generic drug: tuberculin Inject into the skin once. Start taking on: May 25, 2019       Review of Systems  Immunization History  Administered Date(s) Administered  . Influenza Split 01/06/2014  . Influenza Whole 01/07/2012, 01/06/2013  . Influenza, High Dose Seasonal PF 12/25/2015, 01/20/2017  . Influenza,inj,Quad PF,6+ Mos 12/21/2014  . Influenza-Unspecified 12/09/2018  . Pneumococcal Conjugate-13 02/01/2014  . Pneumococcal Polysaccharide-23 04/08/2004  . Td 04/08/2002  . Tdap 04/09/2011  . Zoster 04/08/2008  . Zoster Recombinat (Shingrix) 08/27/2017   Pertinent  Health Maintenance  Due  Topic Date Due  . MAMMOGRAM  06/14/2015  . INFLUENZA VACCINE  Completed  . DEXA SCAN  Completed  . PNA vac Low Risk Adult  Completed   Fall Risk  03/24/2014 10/15/2012  Falls in the past year? No No   Functional Status Survey:    Vitals:   05/21/19 1509  BP: 120/80  Pulse: 76  Resp: 20  Temp: (!) 96.4 F (35.8 C)  SpO2: 94%  Weight: 173 lb (78.5 kg)  Height: 5' 5.5" (1.664 m)   Body mass index is 28.35 kg/m. Physical Exam  Labs reviewed: Recent Labs    05/08/19 0327 05/08/19 0327 05/09/19 [id number] 05/09/19 0338 05/10/19 0341 05/10/19 0341 05/12/19 0334 05/13/19 0403 05/20/19 0000  NA 138   < > 138   < > 139   < > 139 138 140  K 3.6   < > 3.3*   < > 3.5   < > 3.3* 3.6 3.4  CL 103   < > 100   < > 104   < > 104 105 102   CO2 26   < > 30   < > 28   < > 25 24 30*  GLUCOSE 106*   < > 104*   < > 99  --  97 101*  --   BUN 17   < > 15   < > 13   < > 19 16 14   CREATININE 0.48   < > 0.52   < > 0.55   < > 0.62 0.60 0.7  CALCIUM 8.3*   < > 8.4*   < > 8.7*   < > 8.7* 8.4* 8.9  MG 1.8  --  1.8  --  2.0  --   --   --   --   PHOS 2.1*  --  3.3  --  3.0  --   --   --   --    < > = values in this interval not displayed.   Recent Labs    05/08/19 0327 05/08/19 0327 05/09/19 0338 05/10/19 0341 05/20/19 0000  AST 29   < > 26 25 16   ALT 23   < > 22 21 15   ALKPHOS 106   < > 97 91 254*  BILITOT 1.5*  --  1.1 1.4*  --   PROT 5.7*  --  5.6* 5.5*  --   ALBUMIN 3.1*   < > 2.9* 2.9* 3.5   < > = values in this interval not displayed.   Recent Labs    06/16/18 1056 05/04/19 0025 05/04/19 0449 05/11/19 0422 05/11/19 0422 05/12/19 0334 05/13/19 0403 05/20/19 0000  WBC 6.0 13.3*   < > 7.6   < > 7.4 8.3 5.9  NEUTROABS 3.7 11.3*  --   --   --   --   --  4,260  HGB 15.1 13.6   < > 10.8*   < > 10.8* 11.3* 12.1  HCT 44.8 41.6   < > 33.4*   < > 33.6* 36.2 36  MCV 89 93.7   < > 95.4  --  95.2 97.1  --   PLT 218 198   < > 220   < > 247 275 364   < > = values in this interval not displayed.   Lab Results  Component Value Date   TSH 1.51 02/28/2016   No results found for: HGBA1C Lab Results  Component Value Date   CHOL 249 (  H) 06/27/2015   HDL 70 06/27/2015   LDLCALC 160 (H) 06/27/2015   TRIG 97 06/27/2015   CHOLHDL 3.6 06/27/2015    Significant Diagnostic Results in last 30 days:  DG Chest 2 View  Result Date: 05/03/2019 CLINICAL DATA:  Chest pain after fall EXAM: CHEST - 2 VIEW COMPARISON:  11/29/2015 FINDINGS: Low lung volumes with linear scarring or atelectasis at the bases. Stable cardiomediastinal silhouette with aortic atherosclerosis. No pneumothorax. Displaced fracture involving the proximal left humerus. IMPRESSION: 1. Low lung volumes with atelectasis or scarring at the bases 2. Displaced proximal left  humerus fracture Electronically Signed   By: Donavan Foil M.D.   On: 05/03/2019 22:02   DG Abd 1 View  Result Date: 05/09/2019 CLINICAL DATA:  Abdominal bloating. EXAM: ABDOMEN - 1 VIEW COMPARISON:  None. FINDINGS: The patient's left hand overlies left side of the abdomen and could not be moved due to recent shoulder surgery. There appears to be some fecal loading in the distal colon/rectum. No evidence of bowel obstruction. Calcifications in the right upper quadrant could represent renal stones. No other acute abnormalities. IMPRESSION: Fecal loading in the rectum and sigmoid colon. No bowel obstruction noted. Calcifications in the right upper quadrant may represent renal stones. Electronically Signed   By: Dorise Bullion III M.D   On: 05/09/2019 11:29   CT Head Wo Contrast  Result Date: 05/03/2019 CLINICAL DATA:  Fall, facial lacerations EXAM: CT HEAD WITHOUT CONTRAST CT MAXILLOFACIAL WITHOUT CONTRAST CT CERVICAL SPINE WITHOUT CONTRAST TECHNIQUE: Multidetector CT imaging of the head, cervical spine, and maxillofacial structures were performed using the standard protocol without intravenous contrast. Multiplanar CT image reconstructions of the cervical spine and maxillofacial structures were also generated. COMPARISON:  MR head 12/08/2012, CT head 08/16/2009 FINDINGS: CT HEAD FINDINGS Brain: Punctate foci of hypoattenuation the bilateral basal ganglia likely reflect sequela of remote lacunar infarcts several of which were present on comparison CT from 20/11. There mixed patchy and confluent areas of hypoattenuation throughout the subcortical deep and periventricular white matter, similar to slightly advanced from comparison in most compatible with chronic microvascular angiopathy. Symmetric prominence of the ventricles, cisterns and sulci compatible with parenchymal volume loss. Slight ex vacuo dilatation of the ventricles. No convincing evidence of acute infarction, hemorrhage, frank hydrocephalus,  extra-axial collection or mass lesion/mass effect. Vascular: Atherosclerotic calcification of the carotid siphons. No hyperdense vessel. Skull: No calvarial fracture, scalp swelling or hematoma. Other: None CT MAXILLOFACIAL FINDINGS Osseous: The osseous structures appear diffusely demineralized which may limit detection of small or nondisplaced fractures. No fracture of the bony orbits. Nasal bones are intact. No mid face fractures are seen. The pterygoid plates are intact. There is a comminuted fracture of the right mandibular condyle which appears impacted and slightly anteriorly displaced relative to the contralateral side. No other mandibular fracture is identified. The left temporomandibular joint is more normally aligned. 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 No temporal bone fractures are identified. There is complete absence of the mandibular dentition. Maxillary dental bridge is noted with several dental implants. Orbits: The globes appear normal and symmetric with evidence of prior bilateral lens extractions. Symmetric appearance of the extraocular musculature and optic nerve sheath complexes. Normal caliber of the superior ophthalmic veins. Sinuses: Minimal thickening in the maxillary and ethmoidal sinuses. No air-fluid levels. Mastoid air cells are well aerated, better seen on CT images head. Middle ear cavities are clear. Ossicular chains appear normally configured. Soft tissues: Some pre mandibular soft tissue swelling and  thickening is noted, compatible with the reported site of laceration. No other acute soft tissue abnormality is seen. CT CERVICAL SPINE FINDINGS Alignment: Slight exaggeration of the normal cervical lordosis is likely positional. Stabilization collar is not visualized at the time of exam. There is narrowing of the basion dens interval with developing and no a pseudoarticulation which is a variant finding in can be seen in degenerative change. There  is slight stepwise retrolisthesis of C3-C5 on a likely degenerative basis given spondylitic and facet changes at the involved levels. No traumatic listhesis. No abnormally widened, perched or jumped facets. Skull base and vertebrae: No acute fracture. No primary bone lesion or focal pathologic process. Soft tissues and spinal canal: No pre or paravertebral fluid or swelling. No visible canal hematoma. Disc levels: Multilevel intervertebral disc height loss with spondylitic endplate changes. Disc osteophyte complexes at the C2-C6 levels efface the ventral thecal sac with at most mild canal stenosis. Uncinate spurring and facet hypertrophic changes result in mild-to-moderate multilevel neural foraminal narrowing with slightly more severe narrowing on the right at C3-4 and C4-5. Upper chest: No acute abnormality in the upper chest or imaged lung apices. Other: Slightly heterogeneous enlarged thyroid with few punctate calcifications. IMPRESSION: 1. No acute intracranial abnormality. No scalp swelling or calvarial fracture. 2. 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 3. Chronic microvascular angiopathic changes and parenchymal volume loss, somewhat progressed since comparison studies. 4. Comminuted, impacted and anteriorly displaced fracture of the right mandibular condyle. No other mandibular fracture identified. Associated pre mandibular soft tissue swelling which likely correlates with site of reported laceration. 5. No other facial bone fracture. 6. No acute fracture or traumatic listhesis of the cervical spine. 7. Multilevel intervertebral disc height loss and spondylitic changes detailed above. 8. Heterogeneous, nodular thyroid. If further evaluation is warranted on a clinical basis following discussion of patient's risk factors and comorbidities, consider follow-up ultrasound. This follows consensus guidelines: Managing Incidental Thyroid Nodules Detected on Imaging: White Paper of the ACR  Incidental Thyroid Findings Committee. J Am Coll Radiol 2015; 12:143-150. and [facility] 3-tiered system for managing ITNs: J Am Coll Radiol. 2015; Feb;12(2): 143-50 Electronically Signed   By: Lovena Le M.D.   On: 05/03/2019 22:34   CT Cervical Spine Wo Contrast  Result Date: 05/03/2019 CLINICAL DATA:  Fall, facial lacerations EXAM: CT HEAD WITHOUT CONTRAST CT MAXILLOFACIAL WITHOUT CONTRAST CT CERVICAL SPINE WITHOUT CONTRAST TECHNIQUE: Multidetector CT imaging of the head, cervical spine, and maxillofacial structures were performed using the standard protocol without intravenous contrast. Multiplanar CT image reconstructions of the cervical spine and maxillofacial structures were also generated. COMPARISON:  MR head 12/08/2012, CT head 08/16/2009 FINDINGS: CT HEAD FINDINGS Brain: Punctate foci of hypoattenuation the bilateral basal ganglia likely reflect sequela of remote lacunar infarcts several of which were present on comparison CT from 20/11. There mixed patchy and confluent areas of hypoattenuation throughout the subcortical deep and periventricular white matter, similar to slightly advanced from comparison in most compatible with chronic microvascular angiopathy. Symmetric prominence of the ventricles, cisterns and sulci compatible with parenchymal volume loss. Slight ex vacuo dilatation of the ventricles. No convincing evidence of acute infarction, hemorrhage, frank hydrocephalus, extra-axial collection or mass lesion/mass effect. Vascular: Atherosclerotic calcification of the carotid siphons. No hyperdense vessel. Skull: No calvarial fracture, scalp swelling or hematoma. Other: None CT MAXILLOFACIAL FINDINGS Osseous: The osseous structures appear diffusely demineralized which may limit detection of small or nondisplaced fractures. No fracture of the bony orbits. Nasal bones are intact. No mid  face fractures are seen. The pterygoid plates are intact. There is a comminuted fracture of the right mandibular  condyle which appears impacted and slightly anteriorly displaced relative to the contralateral side. No other mandibular fracture is identified. The left temporomandibular joint is more normally aligned. 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 No temporal bone fractures are identified. There is complete absence of the mandibular dentition. Maxillary dental bridge is noted with several dental implants. Orbits: The globes appear normal and symmetric with evidence of prior bilateral lens extractions. Symmetric appearance of the extraocular musculature and optic nerve sheath complexes. Normal caliber of the superior ophthalmic veins. Sinuses: Minimal thickening in the maxillary and ethmoidal sinuses. No air-fluid levels. Mastoid air cells are well aerated, better seen on CT images head. Middle ear cavities are clear. Ossicular chains appear normally configured. Soft tissues: Some pre mandibular soft tissue swelling and thickening is noted, compatible with the reported site of laceration. No other acute soft tissue abnormality is seen. CT CERVICAL SPINE FINDINGS Alignment: Slight exaggeration of the normal cervical lordosis is likely positional. Stabilization collar is not visualized at the time of exam. There is narrowing of the basion dens interval with developing and no a pseudoarticulation which is a variant finding in can be seen in degenerative change. There is slight stepwise retrolisthesis of C3-C5 on a likely degenerative basis given spondylitic and facet changes at the involved levels. No traumatic listhesis. No abnormally widened, perched or jumped facets. Skull base and vertebrae: No acute fracture. No primary bone lesion or focal pathologic process. Soft tissues and spinal canal: No pre or paravertebral fluid or swelling. No visible canal hematoma. Disc levels: Multilevel intervertebral disc height loss with spondylitic endplate changes. Disc osteophyte complexes at the  C2-C6 levels efface the ventral thecal sac with at most mild canal stenosis. Uncinate spurring and facet hypertrophic changes result in mild-to-moderate multilevel neural foraminal narrowing with slightly more severe narrowing on the right at C3-4 and C4-5. Upper chest: No acute abnormality in the upper chest or imaged lung apices. Other: Slightly heterogeneous enlarged thyroid with few punctate calcifications. IMPRESSION: 1. No acute intracranial abnormality. No scalp swelling or calvarial fracture. 2. 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 3. Chronic microvascular angiopathic changes and parenchymal volume loss, somewhat progressed since comparison studies. 4. Comminuted, impacted and anteriorly displaced fracture of the right mandibular condyle. No other mandibular fracture identified. Associated pre mandibular soft tissue swelling which likely correlates with site of reported laceration. 5. No other facial bone fracture. 6. No acute fracture or traumatic listhesis of the cervical spine. 7. Multilevel intervertebral disc height loss and spondylitic changes detailed above. 8. Heterogeneous, nodular thyroid. If further evaluation is warranted on a clinical basis following discussion of patient's risk factors and comorbidities, consider follow-up ultrasound. This follows consensus guidelines: Managing Incidental Thyroid Nodules Detected on Imaging: White Paper of the ACR Incidental Thyroid Findings Committee. J Am Coll Radiol 2015; 12:143-150. and [facility] 3-tiered system for managing ITNs: J Am Coll Radiol. 2015; Feb;12(2): 143-50 Electronically Signed   By: Lovena Le M.D.   On: 05/03/2019 22:34   DG CHEST PORT 1 VIEW  Result Date: 05/10/2019 CLINICAL DATA:  Worsening oxygen saturation. EXAM: PORTABLE CHEST 1 VIEW COMPARISON:  05/03/2019 FINDINGS: Worsening atelectasis in both lower lobes, worse on the left than the right. There could be developing pleural effusions. Upper lungs appear clear.  IMPRESSION: Worsening atelectasis in both lower lobes, left more than right. Possible developing effusions. Electronically Signed  By: Nelson Chimes M.D.   On: 05/10/2019 10:52   DG Shoulder Left  Result Date: 05/03/2019 CLINICAL DATA:  Fall with shoulder pain and deformity EXAM: LEFT SHOULDER - 2+ VIEW COMPARISON:  None. FINDINGS: AC joint is intact. Acute comminuted humeral neck fracture with close to 1 shaft diameter of displacement for the midline. Moderate valgus angulation of distal fracture fragment. Mildly displaced greater tuberosity fracture fragment. IMPRESSION: Acute displaced and angulated proximal humerus fracture involving the humeral neck and greater tuberosity. Electronically Signed   By: Donavan Foil M.D.   On: 05/03/2019 22:04   CT Maxillofacial WO CM  Result Date: 05/03/2019 CLINICAL DATA:  Fall, facial lacerations EXAM: CT HEAD WITHOUT CONTRAST CT MAXILLOFACIAL WITHOUT CONTRAST CT CERVICAL SPINE WITHOUT CONTRAST TECHNIQUE: Multidetector CT imaging of the head, cervical spine, and maxillofacial structures were performed using the standard protocol without intravenous contrast. Multiplanar CT image reconstructions of the cervical spine and maxillofacial structures were also generated. COMPARISON:  MR head 12/08/2012, CT head 08/16/2009 FINDINGS: CT HEAD FINDINGS Brain: Punctate foci of hypoattenuation the bilateral basal ganglia likely reflect sequela of remote lacunar infarcts several of which were present on comparison CT from 20/11. There mixed patchy and confluent areas of hypoattenuation throughout the subcortical deep and periventricular white matter, similar to slightly advanced from comparison in most compatible with chronic microvascular angiopathy. Symmetric prominence of the ventricles, cisterns and sulci compatible with parenchymal volume loss. Slight ex vacuo dilatation of the ventricles. No convincing evidence of acute infarction, hemorrhage, frank hydrocephalus, extra-axial  collection or mass lesion/mass effect. Vascular: Atherosclerotic calcification of the carotid siphons. No hyperdense vessel. Skull: No calvarial fracture, scalp swelling or hematoma. Other: None CT MAXILLOFACIAL FINDINGS Osseous: The osseous structures appear diffusely demineralized which may limit detection of small or nondisplaced fractures. No fracture of the bony orbits. Nasal bones are intact. No mid face fractures are seen. The pterygoid plates are intact. There is a comminuted fracture of the right mandibular condyle which appears impacted and slightly anteriorly displaced relative to the contralateral side. No other mandibular fracture is identified. The left temporomandibular joint is more normally aligned. 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 No temporal bone fractures are identified. There is complete absence of the mandibular dentition. Maxillary dental bridge is noted with several dental implants. Orbits: The globes appear normal and symmetric with evidence of prior bilateral lens extractions. Symmetric appearance of the extraocular musculature and optic nerve sheath complexes. Normal caliber of the superior ophthalmic veins. Sinuses: Minimal thickening in the maxillary and ethmoidal sinuses. No air-fluid levels. Mastoid air cells are well aerated, better seen on CT images head. Middle ear cavities are clear. Ossicular chains appear normally configured. Soft tissues: Some pre mandibular soft tissue swelling and thickening is noted, compatible with the reported site of laceration. No other acute soft tissue abnormality is seen. CT CERVICAL SPINE FINDINGS Alignment: Slight exaggeration of the normal cervical lordosis is likely positional. Stabilization collar is not visualized at the time of exam. There is narrowing of the basion dens interval with developing and no a pseudoarticulation which is a variant finding in can be seen in degenerative change. There is slight  stepwise retrolisthesis of C3-C5 on a likely degenerative basis given spondylitic and facet changes at the involved levels. No traumatic listhesis. No abnormally widened, perched or jumped facets. Skull base and vertebrae: No acute fracture. No primary bone lesion or focal pathologic process. Soft tissues and spinal canal: No pre or paravertebral fluid or swelling. No  visible canal hematoma. Disc levels: Multilevel intervertebral disc height loss with spondylitic endplate changes. Disc osteophyte complexes at the C2-C6 levels efface the ventral thecal sac with at most mild canal stenosis. Uncinate spurring and facet hypertrophic changes result in mild-to-moderate multilevel neural foraminal narrowing with slightly more severe narrowing on the right at C3-4 and C4-5. Upper chest: No acute abnormality in the upper chest or imaged lung apices. Other: Slightly heterogeneous enlarged thyroid with few punctate calcifications. IMPRESSION: 1. No acute intracranial abnormality. No scalp swelling or calvarial fracture. 2. 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 3. Chronic microvascular angiopathic changes and parenchymal volume loss, somewhat progressed since comparison studies. 4. Comminuted, impacted and anteriorly displaced fracture of the right mandibular condyle. No other mandibular fracture identified. Associated pre mandibular soft tissue swelling which likely correlates with site of reported laceration. 5. No other facial bone fracture. 6. No acute fracture or traumatic listhesis of the cervical spine. 7. Multilevel intervertebral disc height loss and spondylitic changes detailed above. 8. Heterogeneous, nodular thyroid. If further evaluation is warranted on a clinical basis following discussion of patient's risk factors and comorbidities, consider follow-up ultrasound. This follows consensus guidelines: Managing Incidental Thyroid Nodules Detected on Imaging: White Paper of the ACR Incidental Thyroid  Findings Committee. J Am Coll Radiol 2015; 12:143-150. and [facility] 3-tiered system for managing ITNs: J Am Coll Radiol. 2015; Feb;12(2): 143-50 Electronically Signed   By: Lovena Le M.D.   On: 05/03/2019 22:34    Assessment/Plan There are no diagnoses linked to this encounter.   Family/ staff Communication:   Labs/tests ordered:

## 2019-05-21 NOTE — Assessment & Plan Note (Signed)
Continue Memantine for memory.  

## 2019-05-21 NOTE — Progress Notes (Signed)
Location:   [facility] Room Number: 4 Place of Service:  SNF (31) Provider: Lennie Odor Alexsander Cavins NP  Leighton Ruff, MD  Patient Care Team: Leighton Ruff, MD as PCP - General (Family Medicine) [name], MD as Consulting Physician (Ophthalmology) [name], MD as Consulting Physician (Cardiology) [name], MD as Consulting Physician (Dermatology) [name], MD as Consulting Physician (Orthopedic Surgery) [name], MD as Consulting Physician (Orthopedic Surgery) [name], MD as Consulting Physician (Orthopedic Surgery) [name], [place] Sallyanne Birkhead X, NP as Nurse Practitioner (Nurse Practitioner) [name], MD as Consulting Physician (Neurology)  Extended Emergency Contact Information Primary Emergency Contact: [name],[name] Address: [address]          [place] [address] [place] Phone: [phone] Relation: Sister Secondary Emergency Contact: [name] [place] Phone: [phone] Relation: Sister  Code Status: DNR Goals of care: Advanced Directive information Advanced Directives 05/21/2019  Does Patient Have a Medical Advance Directive? Yes  Type of Advance Directive Out of facility DNR (pink MOST or yellow form)  Does patient want to make changes to medical advance directive? No - Patient declined  Copy of [place] in Chart? -  Pre-existing out of facility DNR order (yellow form or pink MOST form) Yellow form placed in chart (order not valid for inpatient use);Pink MOST form placed in chart (order not valid for inpatient use)     Chief Complaint  Patient presents with  . Acute Visit    Swelling in legs    HPI:  Pt is a 86 y.o. female seen today for an acute visit for evaluation of worsened swelling in legs, not new, no redness or warmth or wounds. The patient denied SOB, cough, sputum production, chest pain/pressure, or palpitation.  Persisted sensation of "slipping away" while on commode, then the patient became panic-initially it was thought to be vasovagal event, but no significant Bp changing. Also the staff reported the patient became tremulous with limbs extended while on commode. No constipation, taking Senokot. Hx of seizures, on Lamictal, Keppra. HTN, blood pressure is controlled, on Amlodipine. Her memory is preserved on Memantine.    Past Medical History:  Diagnosis Date  . Acute bronchitis 05/23/2011  . Acute upper respiratory infections of unspecified site 05/23/2011  . Arthritis   . Chronic airway obstruction, not elsewhere classified 05/23/2011  . Disturbance of salivary secretion 01/31/2011  . Dizziness and giddiness 01/31/2011  . Essential tremor 04/25/2014  . External hemorrhoids without mention of complication [id number]  . Gait disorder 04/25/2014  . Insomnia, unspecified 09/12/2011  . Lumbago 01/31/2011  . Major depressive disorder, single episode, unspecified 01/31/2011  . Memory disorder 04/25/2014  . Mitral valve disorders(424.0) 01/31/2011  . Other and unspecified hyperlipidemia 01/31/2011  . Other convulsions 01/31/2011  . Other emphysema ([place]) 01/31/2011  . Pain in joint, site unspecified 01/31/2011  . Restless legs syndrome (RLS) 09/12/2011  . Retinal detachment with retinal defect of right eye 2011   right eye twice  . Senile osteoporosis 01/31/2011  . Spontaneous ecchymoses 01/31/2011  . Stiffness of joints, not elsewhere classified, multiple sites 01/31/2011  . Unspecified essential hypertension 01/31/2011   Past Surgical History:  Procedure Laterality Date  . ABDOMINAL HYSTERECTOMY  06/21/2003   TAH/BSO, omenectomy PSB resect, Stg IC cystadenofibroma  . CHOLECYSTECTOMY  2005   Dr. Marlou Starks  . ELBOW SURGERY Right 2008   broken   Dr. Apolonio Schneiders  . EYE SURGERY    .  RETINAL DETACHMENT SURGERY N/A    two  . REVERSE SHOULDER ARTHROPLASTY Left 05/06/2019   Procedure: REVERSE SHOULDER ARTHROPLASTY;   Surgeon: Justice Britain, MD;  Location: [facility];  Service: Orthopedics;  Laterality: Left;  151min  . ROTATOR CUFF REPAIR Right 2012   Dr. Theda Sers  . SQUAMOUS CELL CARCINOMA EXCISION Bilateral 2012, 8/14   [place] on legs   Dr. Sarajane Jews  . TONSILLECTOMY  1941  . VIDEO BRONCHOSCOPY WITH ENDOBRONCHIAL NAVIGATION N/A 11/29/2015   Procedure: VIDEO BRONCHOSCOPY WITH ENDOBRONCHIAL NAVIGATION;  Surgeon: Collene Gobble, MD;  Location: [facility];  Service: Thoracic;  Laterality: N/A;    Allergies  Allergen Reactions  . Latex Other (See Comments)    Swelling  . Sulfa Antibiotics Nausea And Vomiting  . Tetracyclines & Related     Allergies as of 05/21/2019      Reactions   Latex Other (See Comments)   Swelling   Sulfa Antibiotics Nausea And Vomiting   Tetracyclines & Related       Medication List       Accurate as of May 21, 2019  4:36 PM. If you have any questions, ask your nurse or doctor.        STOP taking these medications   amLODipine 5 MG tablet Commonly known as: NORVASC Stopped by: Mccrae Speciale X Edenilson Austad, NP     TAKE these medications   acetaminophen 325 MG tablet Commonly known as: TYLENOL Take 650 mg by mouth every 4 (four) hours as needed.   alendronate 70 MG tablet Commonly known as: FOSAMAX Take 70 mg by mouth once a week.   aspirin 81 MG tablet Take 81 mg by mouth daily.   cholecalciferol 1000 units tablet Commonly known as: VITAMIN D Take 1 tablet (1,000 Units total) by mouth daily.   hydrochlorothiazide 12.5 MG capsule Commonly known as: MICROZIDE Take 12.5 mg by mouth daily.   hydrocortisone 2.5 % cream Apply 1 application topically as needed (for psoriasis).   ketoconazole 2 % cream Commonly known as: NIZORAL Apply 1 application topically as needed (for psoriasis).   lamoTRIgine 150 MG tablet Commonly known as: LAMICTAL TAKE 1 TABLET BY MOUTH TWICE DAILY.   levETIRAcetam 500 MG tablet Commonly known as: KEPPRA TAKE 1/2 TABLET IN THE AM AND 1 TABLET AT  BEDTIME.   memantine 10 MG tablet Commonly known as: NAMENDA TAKE 1 TABLET BY MOUTH TWICE DAILY.   potassium chloride SA 20 MEQ tablet Commonly known as: KLOR-CON Take 20 mEq by mouth daily.   pramipexole 0.25 MG tablet Commonly known as: MIRAPEX Take 0.25 mg by mouth at bedtime. What changed: Another medication with the same name was removed. Continue taking this medication, and follow the directions you see here. Changed by: Alejandro Gamel X Aubree Doody, NP   ProAir HFA 108 (90 Base) MCG/ACT inhaler Generic drug: albuterol 2 PUFFS INTO THE LUNGS EVERY 4 HOURS IF NEEDED FOR WHEEZING/SHORTNESSOF BREATH.   sennosides-docusate sodium 8.6-50 MG tablet Commonly known as: SENOKOT-S Take 1 tablet by mouth daily.   simvastatin 10 MG tablet Commonly known as: ZOCOR Take 10 mg by mouth daily. Reported on 06/27/2015   tiotropium 18 MCG inhalation capsule Commonly known as: Spiriva HandiHaler INHALE CONTENTS OF ONE CAPSULE ONCE DAILY FOR COPD.   traMADol 50 MG tablet Commonly known as: ULTRAM Take 1 tablet (50 mg total) by mouth every 6 (six) hours as needed for moderate pain.   Tubersol 5 UNIT/0.1ML injection Generic drug: tuberculin Inject into the skin once. Start taking on:  May 25, 2019       Review of Systems  Constitutional: Negative for activity change, appetite change, chills, diaphoresis, fatigue and fever.  HENT: Positive for hearing loss. Negative for congestion, trouble swallowing and voice change.   Respiratory: Negative for cough, shortness of breath and wheezing.        DOE  Cardiovascular: Positive for leg swelling. Negative for chest pain and palpitations.  Gastrointestinal: Negative for abdominal distention, abdominal pain, constipation, diarrhea, nausea and vomiting.  Genitourinary: Negative for difficulty urinating, dysuria and urgency.  Musculoskeletal: Positive for arthralgias, gait problem and joint swelling.       Right jaw pain when open mouth wide. Left shoulder  swelling and pain with movement.   Skin: Positive for wound.       Left shoulder surgical wound.   Neurological: Positive for seizures. Negative for dizziness, tremors, syncope, facial asymmetry, speech difficulty, weakness, light-headedness, numbness and headaches.       Slipping away sensation while on commode x3 in the past-near syncope? No active seizures. Slightly memory lapses.   Psychiatric/Behavioral: Negative for agitation, behavioral problems, hallucinations and sleep disturbance. The patient is not nervous/anxious.     Immunization History  Administered Date(s) Administered  . Influenza Split 01/06/2014  . Influenza Whole 01/07/2012, 01/06/2013  . Influenza, High Dose Seasonal PF 12/25/2015, 01/20/2017  . Influenza,inj,Quad PF,6+ Mos 12/21/2014  . Influenza-Unspecified 12/09/2018  . Pneumococcal Conjugate-13 02/01/2014  . Pneumococcal Polysaccharide-23 04/08/2004  . Td 04/08/2002  . Tdap 04/09/2011  . Zoster 04/08/2008  . Zoster Recombinat (Shingrix) 08/27/2017   Pertinent  Health Maintenance Due  Topic Date Due  . MAMMOGRAM  06/14/2015  . INFLUENZA VACCINE  Completed  . DEXA SCAN  Completed  . PNA vac Low Risk Adult  Completed   Fall Risk  03/24/2014 10/15/2012  Falls in the past year? No No   Functional Status Survey:    Vitals:   05/21/19 1509  BP: 120/80  Pulse: 76  Resp: 20  Temp: (!) 96.4 F (35.8 C)  SpO2: 94%  Weight: 173 lb (78.5 kg)  Height: 5' 5.5" (1.664 m)   Body mass index is 28.35 kg/m. Physical Exam Vitals and nursing note reviewed.  Constitutional:      General: She is not in acute distress.    Appearance: Normal appearance. She is not ill-appearing, toxic-appearing or diaphoretic.  HENT:     Head: Normocephalic and atraumatic.     Nose: Nose normal.     Mouth/Throat:     Mouth: Mucous membranes are moist.  Eyes:     Extraocular Movements: Extraocular movements intact.     Conjunctiva/sclera: Conjunctivae normal.     Pupils:  Pupils are equal, round, and reactive to light.  Cardiovascular:     Rate and Rhythm: Normal rate and regular rhythm.     Heart sounds: Murmur present.     Comments: Weak DP pulses.  Pulmonary:     Breath sounds: No wheezing, rhonchi or rales.     Comments: Decreased air entry to both lungs.  Abdominal:     General: Bowel sounds are normal. There is no distension.     Palpations: Abdomen is soft.     Tenderness: There is no abdominal tenderness. There is no right CVA tenderness, left CVA tenderness, guarding or rebound.  Musculoskeletal:     Cervical back: Normal range of motion and neck supple. No rigidity or tenderness.     Right lower leg: Edema present.     Left  lower leg: Edema present.     Comments: Moderate edema BLE, mor edematous R+L foot.  Right jaw pain when open mouth wide. Left shoulder swelling and pain with movement.    Skin:    General: Skin is warm and dry.     Comments: Left surgical wound is covered in dressing during my examination. Swelling noted, no warmth or heat in the area.   Neurological:     General: No focal deficit present.     Mental Status: She is alert and oriented to person, place, and time. Mental status is at baseline.     Motor: No weakness.     Coordination: Coordination normal.     Gait: Gait abnormal.  Psychiatric:        Mood and Affect: Mood normal.        Behavior: Behavior normal.        Thought Content: Thought content normal.        Judgment: Judgment normal.     Labs reviewed: Recent Labs    05/08/19 0327 05/08/19 0327 05/09/19 [id number] 05/09/19 [id number] 05/10/19 0341 05/10/19 0341 05/12/19 0334 05/13/19 0403 05/20/19 0000  NA 138   < > 138   < > 139   < > 139 138 140  K 3.6   < > 3.3*   < > 3.5   < > 3.3* 3.6 3.4  CL 103   < > 100   < > 104   < > 104 105 102  CO2 26   < > 30   < > 28   < > 25 24 30*  GLUCOSE 106*   < > 104*   < > 99  --  97 101*  --   BUN 17   < > 15   < > 13   < > 19 16 14   CREATININE 0.48   < > 0.52   < >  0.55   < > 0.62 0.60 0.7  CALCIUM 8.3*   < > 8.4*   < > 8.7*   < > 8.7* 8.4* 8.9  MG 1.8  --  1.8  --  2.0  --   --   --   --   PHOS 2.1*  --  3.3  --  3.0  --   --   --   --    < > = values in this interval not displayed.   Recent Labs    05/08/19 0327 05/08/19 0327 05/09/19 0338 05/10/19 0341 05/20/19 0000  AST 29   < > 26 25 16   ALT 23   < > 22 21 15   ALKPHOS 106   < > 97 91 254*  BILITOT 1.5*  --  1.1 1.4*  --   PROT 5.7*  --  5.6* 5.5*  --   ALBUMIN 3.1*   < > 2.9* 2.9* 3.5   < > = values in this interval not displayed.   Recent Labs    06/16/18 1056 05/04/19 0025 05/04/19 0449 05/11/19 0422 05/11/19 0422 05/12/19 0334 05/13/19 0403 05/20/19 0000  WBC 6.0 13.3*   < > 7.6   < > 7.4 8.3 5.9  NEUTROABS 3.7 11.3*  --   --   --   --   --  4,260  HGB 15.1 13.6   < > 10.8*   < > 10.8* 11.3* 12.1  HCT 44.8 41.6   < > 33.4*   < > 33.6* 36.2 36  MCV 89  93.7   < > 95.4  --  95.2 97.1  --   PLT 218 198   < > 220   < > 247 275 364   < > = values in this interval not displayed.   Lab Results  Component Value Date   TSH 1.51 02/28/2016   No results found for: HGBA1C Lab Results  Component Value Date   CHOL 249 (H) 06/27/2015   HDL 70 06/27/2015   LDLCALC 160 (H) 06/27/2015   TRIG 97 06/27/2015   CHOLHDL 3.6 06/27/2015    Significant Diagnostic Results in last 30 days:  DG Chest 2 View  Result Date: 05/03/2019 CLINICAL DATA:  Chest pain after fall EXAM: CHEST - 2 VIEW COMPARISON:  11/29/2015 FINDINGS: Low lung volumes with linear scarring or atelectasis at the bases. Stable cardiomediastinal silhouette with aortic atherosclerosis. No pneumothorax. Displaced fracture involving the proximal left humerus. IMPRESSION: 1. Low lung volumes with atelectasis or scarring at the bases 2. Displaced proximal left humerus fracture Electronically Signed   By: Donavan Foil M.D.   On: 05/03/2019 22:02   DG Abd 1 View  Result Date: 05/09/2019 CLINICAL DATA:  Abdominal bloating. EXAM:  ABDOMEN - 1 VIEW COMPARISON:  None. FINDINGS: The patient's left hand overlies left side of the abdomen and could not be moved due to recent shoulder surgery. There appears to be some fecal loading in the distal colon/rectum. No evidence of bowel obstruction. Calcifications in the right upper quadrant could represent renal stones. No other acute abnormalities. IMPRESSION: Fecal loading in the rectum and sigmoid colon. No bowel obstruction noted. Calcifications in the right upper quadrant may represent renal stones. Electronically Signed   By: Dorise Bullion III M.D   On: 05/09/2019 11:29   CT Head Wo Contrast  Result Date: 05/03/2019 CLINICAL DATA:  Fall, facial lacerations EXAM: CT HEAD WITHOUT CONTRAST CT MAXILLOFACIAL WITHOUT CONTRAST CT CERVICAL SPINE WITHOUT CONTRAST TECHNIQUE: Multidetector CT imaging of the head, cervical spine, and maxillofacial structures were performed using the standard protocol without intravenous contrast. Multiplanar CT image reconstructions of the cervical spine and maxillofacial structures were also generated. COMPARISON:  MR head 12/08/2012, CT head 08/16/2009 FINDINGS: CT HEAD FINDINGS Brain: Punctate foci of hypoattenuation the bilateral basal ganglia likely reflect sequela of remote lacunar infarcts several of which were present on comparison CT from 20/11. There mixed patchy and confluent areas of hypoattenuation throughout the subcortical deep and periventricular white matter, similar to slightly advanced from comparison in most compatible with chronic microvascular angiopathy. Symmetric prominence of the ventricles, cisterns and sulci compatible with parenchymal volume loss. Slight ex vacuo dilatation of the ventricles. No convincing evidence of acute infarction, hemorrhage, frank hydrocephalus, extra-axial collection or mass lesion/mass effect. Vascular: Atherosclerotic calcification of the carotid siphons. No hyperdense vessel. Skull: No calvarial fracture, scalp  swelling or hematoma. Other: None CT MAXILLOFACIAL FINDINGS Osseous: The osseous structures appear diffusely demineralized which may limit detection of small or nondisplaced fractures. No fracture of the bony orbits. Nasal bones are intact. No mid face fractures are seen. The pterygoid plates are intact. There is a comminuted fracture of the right mandibular condyle which appears impacted and slightly anteriorly displaced relative to the contralateral side. No other mandibular fracture is identified. The left temporomandibular joint is more normally aligned. 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 No temporal bone fractures are identified. There is complete absence of the mandibular dentition. Maxillary dental bridge is noted with several dental implants. Orbits: The globes appear  normal and symmetric with evidence of prior bilateral lens extractions. Symmetric appearance of the extraocular musculature and optic nerve sheath complexes. Normal caliber of the superior ophthalmic veins. Sinuses: Minimal thickening in the maxillary and ethmoidal sinuses. No air-fluid levels. Mastoid air cells are well aerated, better seen on CT images head. Middle ear cavities are clear. Ossicular chains appear normally configured. Soft tissues: Some pre mandibular soft tissue swelling and thickening is noted, compatible with the reported site of laceration. No other acute soft tissue abnormality is seen. CT CERVICAL SPINE FINDINGS Alignment: Slight exaggeration of the normal cervical lordosis is likely positional. Stabilization collar is not visualized at the time of exam. There is narrowing of the basion dens interval with developing and no a pseudoarticulation which is a variant finding in can be seen in degenerative change. There is slight stepwise retrolisthesis of C3-C5 on a likely degenerative basis given spondylitic and facet changes at the involved levels. No traumatic listhesis. No abnormally  widened, perched or jumped facets. Skull base and vertebrae: No acute fracture. No primary bone lesion or focal pathologic process. Soft tissues and spinal canal: No pre or paravertebral fluid or swelling. No visible canal hematoma. Disc levels: Multilevel intervertebral disc height loss with spondylitic endplate changes. Disc osteophyte complexes at the C2-C6 levels efface the ventral thecal sac with at most mild canal stenosis. Uncinate spurring and facet hypertrophic changes result in mild-to-moderate multilevel neural foraminal narrowing with slightly more severe narrowing on the right at C3-4 and C4-5. Upper chest: No acute abnormality in the upper chest or imaged lung apices. Other: Slightly heterogeneous enlarged thyroid with few punctate calcifications. IMPRESSION: 1. No acute intracranial abnormality. No scalp swelling or calvarial fracture. 2. 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 3. Chronic microvascular angiopathic changes and parenchymal volume loss, somewhat progressed since comparison studies. 4. Comminuted, impacted and anteriorly displaced fracture of the right mandibular condyle. No other mandibular fracture identified. Associated pre mandibular soft tissue swelling which likely correlates with site of reported laceration. 5. No other facial bone fracture. 6. No acute fracture or traumatic listhesis of the cervical spine. 7. Multilevel intervertebral disc height loss and spondylitic changes detailed above. 8. Heterogeneous, nodular thyroid. If further evaluation is warranted on a clinical basis following discussion of patient's risk factors and comorbidities, consider follow-up ultrasound. This follows consensus guidelines: Managing Incidental Thyroid Nodules Detected on Imaging: White Paper of the ACR Incidental Thyroid Findings Committee. J Am Coll Radiol 2015; 12:143-150. and [facility] 3-tiered system for managing ITNs: J Am Coll Radiol. 2015; Feb;12(2): 143-50 Electronically Signed    By: Lovena Le M.D.   On: 05/03/2019 22:34   CT Cervical Spine Wo Contrast  Result Date: 05/03/2019 CLINICAL DATA:  Fall, facial lacerations EXAM: CT HEAD WITHOUT CONTRAST CT MAXILLOFACIAL WITHOUT CONTRAST CT CERVICAL SPINE WITHOUT CONTRAST TECHNIQUE: Multidetector CT imaging of the head, cervical spine, and maxillofacial structures were performed using the standard protocol without intravenous contrast. Multiplanar CT image reconstructions of the cervical spine and maxillofacial structures were also generated. COMPARISON:  MR head 12/08/2012, CT head 08/16/2009 FINDINGS: CT HEAD FINDINGS Brain: Punctate foci of hypoattenuation the bilateral basal ganglia likely reflect sequela of remote lacunar infarcts several of which were present on comparison CT from 20/11. There mixed patchy and confluent areas of hypoattenuation throughout the subcortical deep and periventricular white matter, similar to slightly advanced from comparison in most compatible with chronic microvascular angiopathy. Symmetric prominence of the ventricles, cisterns and sulci compatible with parenchymal volume loss. Slight ex  vacuo dilatation of the ventricles. No convincing evidence of acute infarction, hemorrhage, frank hydrocephalus, extra-axial collection or mass lesion/mass effect. Vascular: Atherosclerotic calcification of the carotid siphons. No hyperdense vessel. Skull: No calvarial fracture, scalp swelling or hematoma. Other: None CT MAXILLOFACIAL FINDINGS Osseous: The osseous structures appear diffusely demineralized which may limit detection of small or nondisplaced fractures. No fracture of the bony orbits. Nasal bones are intact. No mid face fractures are seen. The pterygoid plates are intact. There is a comminuted fracture of the right mandibular condyle which appears impacted and slightly anteriorly displaced relative to the contralateral side. No other mandibular fracture is identified. The left temporomandibular joint is  more normally aligned. 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 No temporal bone fractures are identified. There is complete absence of the mandibular dentition. Maxillary dental bridge is noted with several dental implants. Orbits: The globes appear normal and symmetric with evidence of prior bilateral lens extractions. Symmetric appearance of the extraocular musculature and optic nerve sheath complexes. Normal caliber of the superior ophthalmic veins. Sinuses: Minimal thickening in the maxillary and ethmoidal sinuses. No air-fluid levels. Mastoid air cells are well aerated, better seen on CT images head. Middle ear cavities are clear. Ossicular chains appear normally configured. Soft tissues: Some pre mandibular soft tissue swelling and thickening is noted, compatible with the reported site of laceration. No other acute soft tissue abnormality is seen. CT CERVICAL SPINE FINDINGS Alignment: Slight exaggeration of the normal cervical lordosis is likely positional. Stabilization collar is not visualized at the time of exam. There is narrowing of the basion dens interval with developing and no a pseudoarticulation which is a variant finding in can be seen in degenerative change. There is slight stepwise retrolisthesis of C3-C5 on a likely degenerative basis given spondylitic and facet changes at the involved levels. No traumatic listhesis. No abnormally widened, perched or jumped facets. Skull base and vertebrae: No acute fracture. No primary bone lesion or focal pathologic process. Soft tissues and spinal canal: No pre or paravertebral fluid or swelling. No visible canal hematoma. Disc levels: Multilevel intervertebral disc height loss with spondylitic endplate changes. Disc osteophyte complexes at the C2-C6 levels efface the ventral thecal sac with at most mild canal stenosis. Uncinate spurring and facet hypertrophic changes result in mild-to-moderate multilevel neural foraminal  narrowing with slightly more severe narrowing on the right at C3-4 and C4-5. Upper chest: No acute abnormality in the upper chest or imaged lung apices. Other: Slightly heterogeneous enlarged thyroid with few punctate calcifications. IMPRESSION: 1. No acute intracranial abnormality. No scalp swelling or calvarial fracture. 2. 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 3. Chronic microvascular angiopathic changes and parenchymal volume loss, somewhat progressed since comparison studies. 4. Comminuted, impacted and anteriorly displaced fracture of the right mandibular condyle. No other mandibular fracture identified. Associated pre mandibular soft tissue swelling which likely correlates with site of reported laceration. 5. No other facial bone fracture. 6. No acute fracture or traumatic listhesis of the cervical spine. 7. Multilevel intervertebral disc height loss and spondylitic changes detailed above. 8. Heterogeneous, nodular thyroid. If further evaluation is warranted on a clinical basis following discussion of patient's risk factors and comorbidities, consider follow-up ultrasound. This follows consensus guidelines: Managing Incidental Thyroid Nodules Detected on Imaging: White Paper of the ACR Incidental Thyroid Findings Committee. J Am Coll Radiol 2015; 12:143-150. and [facility] 3-tiered system for managing ITNs: J Am Coll Radiol. 2015HF:[id number]): 143-50 Electronically Signed   By: Elwin Sleight.D.  On: 05/03/2019 22:34   DG CHEST PORT 1 VIEW  Result Date: 05/10/2019 CLINICAL DATA:  Worsening oxygen saturation. EXAM: PORTABLE CHEST 1 VIEW COMPARISON:  05/03/2019 FINDINGS: Worsening atelectasis in both lower lobes, worse on the left than the right. There could be developing pleural effusions. Upper lungs appear clear. IMPRESSION: Worsening atelectasis in both lower lobes, left more than right. Possible developing effusions. Electronically Signed   By: Nelson Chimes M.D.   On: 05/10/2019 10:52   DG  Shoulder Left  Result Date: 05/03/2019 CLINICAL DATA:  Fall with shoulder pain and deformity EXAM: LEFT SHOULDER - 2+ VIEW COMPARISON:  None. FINDINGS: AC joint is intact. Acute comminuted humeral neck fracture with close to 1 shaft diameter of displacement for the midline. Moderate valgus angulation of distal fracture fragment. Mildly displaced greater tuberosity fracture fragment. IMPRESSION: Acute displaced and angulated proximal humerus fracture involving the humeral neck and greater tuberosity. Electronically Signed   By: Donavan Foil M.D.   On: 05/03/2019 22:04   CT Maxillofacial WO CM  Result Date: 05/03/2019 CLINICAL DATA:  Fall, facial lacerations EXAM: CT HEAD WITHOUT CONTRAST CT MAXILLOFACIAL WITHOUT CONTRAST CT CERVICAL SPINE WITHOUT CONTRAST TECHNIQUE: Multidetector CT imaging of the head, cervical spine, and maxillofacial structures were performed using the standard protocol without intravenous contrast. Multiplanar CT image reconstructions of the cervical spine and maxillofacial structures were also generated. COMPARISON:  MR head 12/08/2012, CT head 08/16/2009 FINDINGS: CT HEAD FINDINGS Brain: Punctate foci of hypoattenuation the bilateral basal ganglia likely reflect sequela of remote lacunar infarcts several of which were present on comparison CT from 20/11. There mixed patchy and confluent areas of hypoattenuation throughout the subcortical deep and periventricular white matter, similar to slightly advanced from comparison in most compatible with chronic microvascular angiopathy. Symmetric prominence of the ventricles, cisterns and sulci compatible with parenchymal volume loss. Slight ex vacuo dilatation of the ventricles. No convincing evidence of acute infarction, hemorrhage, frank hydrocephalus, extra-axial collection or mass lesion/mass effect. Vascular: Atherosclerotic calcification of the carotid siphons. No hyperdense vessel. Skull: No calvarial fracture, scalp swelling or hematoma.  Other: None CT MAXILLOFACIAL FINDINGS Osseous: The osseous structures appear diffusely demineralized which may limit detection of small or nondisplaced fractures. No fracture of the bony orbits. Nasal bones are intact. No mid face fractures are seen. The pterygoid plates are intact. There is a comminuted fracture of the right mandibular condyle which appears impacted and slightly anteriorly displaced relative to the contralateral side. No other mandibular fracture is identified. The left temporomandibular joint is more normally aligned. 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 No temporal bone fractures are identified. There is complete absence of the mandibular dentition. Maxillary dental bridge is noted with several dental implants. Orbits: The globes appear normal and symmetric with evidence of prior bilateral lens extractions. Symmetric appearance of the extraocular musculature and optic nerve sheath complexes. Normal caliber of the superior ophthalmic veins. Sinuses: Minimal thickening in the maxillary and ethmoidal sinuses. No air-fluid levels. Mastoid air cells are well aerated, better seen on CT images head. Middle ear cavities are clear. Ossicular chains appear normally configured. Soft tissues: Some pre mandibular soft tissue swelling and thickening is noted, compatible with the reported site of laceration. No other acute soft tissue abnormality is seen. CT CERVICAL SPINE FINDINGS Alignment: Slight exaggeration of the normal cervical lordosis is likely positional. Stabilization collar is not visualized at the time of exam. There is narrowing of the basion dens interval with developing and no a pseudoarticulation which  is a variant finding in can be seen in degenerative change. There is slight stepwise retrolisthesis of C3-C5 on a likely degenerative basis given spondylitic and facet changes at the involved levels. No traumatic listhesis. No abnormally widened, perched or  jumped facets. Skull base and vertebrae: No acute fracture. No primary bone lesion or focal pathologic process. Soft tissues and spinal canal: No pre or paravertebral fluid or swelling. No visible canal hematoma. Disc levels: Multilevel intervertebral disc height loss with spondylitic endplate changes. Disc osteophyte complexes at the C2-C6 levels efface the ventral thecal sac with at most mild canal stenosis. Uncinate spurring and facet hypertrophic changes result in mild-to-moderate multilevel neural foraminal narrowing with slightly more severe narrowing on the right at C3-4 and C4-5. Upper chest: No acute abnormality in the upper chest or imaged lung apices. Other: Slightly heterogeneous enlarged thyroid with few punctate calcifications. IMPRESSION: 1. No acute intracranial abnormality. No scalp swelling or calvarial fracture. 2. 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 3. Chronic microvascular angiopathic changes and parenchymal volume loss, somewhat progressed since comparison studies. 4. Comminuted, impacted and anteriorly displaced fracture of the right mandibular condyle. No other mandibular fracture identified. Associated pre mandibular soft tissue swelling which likely correlates with site of reported laceration. 5. No other facial bone fracture. 6. No acute fracture or traumatic listhesis of the cervical spine. 7. Multilevel intervertebral disc height loss and spondylitic changes detailed above. 8. Heterogeneous, nodular thyroid. If further evaluation is warranted on a clinical basis following discussion of patient's risk factors and comorbidities, consider follow-up ultrasound. This follows consensus guidelines: Managing Incidental Thyroid Nodules Detected on Imaging: White Paper of the ACR Incidental Thyroid Findings Committee. J Am Coll Radiol 2015; 12:143-150. and [facility] 3-tiered system for managing ITNs: J Am Coll Radiol. 2015; Feb;12(2): 143-50 Electronically Signed   By: Lovena Le  M.D.   On: 05/03/2019 22:34    Assessment/Plan: Seizure disorder ([place]) 05/21/19 tremulous in limbs with limbs extended every time on commode  having BM, the patient stated it feels like slipping away then she becomes panic-initially it was thought to be vasovagal event.  Bp 160/59mmgHg while on commode today, no headache, dizziness, chest pain/pressure, palpitation, or diaphoresis. BM daily, taking Senokot. Hx of seizures, on Lamictal, Keppra. Will f/u Neurology.   Edema She has increased edema BLE, no cough, SOB, PND, or O2 desaturation, on HCTZ 12.5mg  qd-will increase to 25mg  qd, f/u BMP in one wk. Her admission to SNF wt #173Ibs, wt #158Ibs today-?accuracy, will weigh the patient daily ac breakfast x 5 days.    Slow transit constipation Stable, continue Senokot.   Essential hypertension Blood pressure is controlled, continue Amlodipine.   Memory disorder Continue Memantine for memory.     Family/ staff Communication: plan of care reviewed with the patient and charge nurse.   Labs/tests ordered:  BMP one week  Time spend 25 minutes.

## 2019-05-21 NOTE — Assessment & Plan Note (Addendum)
05/21/19 tremulous in limbs with limbs extended every time on commode  having BM, the patient stated it feels like slipping away then she becomes panic-initially it was thought to be vasovagal event.  Bp 160/72mmgHg while on commode today, no headache, dizziness, chest pain/pressure, palpitation, or diaphoresis. BM daily, taking Senokot. Hx of seizures, on Lamictal, Keppra. Will f/u Neurology.

## 2019-05-21 NOTE — Assessment & Plan Note (Signed)
She has increased edema BLE, no cough, SOB, PND, or O2 desaturation, on HCTZ 12.5mg  qd-will increase to 25mg  qd, f/u BMP in one wk. Her admission to SNF wt #173Ibs, wt #158Ibs today-?accuracy, will weigh the patient daily ac breakfast x 5 days.

## 2019-05-21 NOTE — Assessment & Plan Note (Signed)
Stable, continue Senokot.  

## 2019-05-21 NOTE — Assessment & Plan Note (Signed)
Blood pressure is controlled, continue Amlodipine.  

## 2019-05-25 ENCOUNTER — Encounter: Payer: Self-pay | Admitting: Internal Medicine

## 2019-05-25 ENCOUNTER — Non-Acute Institutional Stay (SKILLED_NURSING_FACILITY): Payer: Medicare PPO | Admitting: Internal Medicine

## 2019-05-25 DIAGNOSIS — I10 Essential (primary) hypertension: Secondary | ICD-10-CM | POA: Diagnosis not present

## 2019-05-25 DIAGNOSIS — G40909 Epilepsy, unspecified, not intractable, without status epilepticus: Secondary | ICD-10-CM

## 2019-05-25 DIAGNOSIS — R42 Dizziness and giddiness: Secondary | ICD-10-CM | POA: Diagnosis not present

## 2019-05-25 DIAGNOSIS — S02611D Fracture of condylar process of right mandible, subsequent encounter for fracture with routine healing: Secondary | ICD-10-CM

## 2019-05-25 DIAGNOSIS — R609 Edema, unspecified: Secondary | ICD-10-CM | POA: Diagnosis not present

## 2019-05-25 NOTE — Progress Notes (Signed)
Location: [facility] Room Number: 35 Place of Service:  SNF (31)  Provider:   Code Status:  Goals of Care:  Advanced Directives 05/21/2019  Does Patient Have a Medical Advance Directive? Yes  Type of Advance Directive Out of facility DNR (pink MOST or yellow form)  Does patient want to make changes to medical advance directive? No - Patient declined  Copy of [place] in Chart? -  Pre-existing out of facility DNR order (yellow form or pink MOST form) Yellow form placed in chart (order not valid for inpatient use);Pink MOST form placed in chart (order not valid for inpatient use)     Chief Complaint  Patient presents with  . Follow-up    Patient is seen for followup following admission     HPI: Patient is a 86 y.o. female seen today for an acute visit for Follow up  Patient is in SNF for Therapy after she fell and sustained Left Humerus Fracture with Displacement needing Arthroplasty , Right Mandibular Fracture, Pneumonia and Urinary retention. Stayed in the Hospital from 1/25-2/4  Patient also has h/o COPD, Hypertension,Seizure Disorder,Mild Cognitive impairment, Hyperlipidemia Her Active problems ? Dizziness or weakness Episodes on Commode where she felt she is falling  Was taken off Norvasc Says doing better. No More episodes LE edema HCTZ increased but still has Mod Edema S/p Arthroplasty Doing well with therapy Pain controlled with tylenol Not using Tramadol S/p Mandibular Fracture C/o Being Liquid Diet Has lost some weight    She was living  in [place]. Was not driving. But was very independent in her ADLS and IADLS. Does not have children. Sisters are POA    Past Medical History:  Diagnosis Date  . Acute bronchitis 05/23/2011  . Acute upper respiratory infections of unspecified site 05/23/2011  . Arthritis   . Chronic airway obstruction, not elsewhere classified 05/23/2011  . Disturbance of salivary secretion 01/31/2011   . Dizziness and giddiness 01/31/2011  . Essential tremor 04/25/2014  . External hemorrhoids without mention of complication [id number]  . Gait disorder 04/25/2014  . Insomnia, unspecified 09/12/2011  . Lumbago 01/31/2011  . Major depressive disorder, single episode, unspecified 01/31/2011  . Memory disorder 04/25/2014  . Mitral valve disorders(424.0) 01/31/2011  . Other and unspecified hyperlipidemia 01/31/2011  . Other convulsions 01/31/2011  . Other emphysema ([place]) 01/31/2011  . Pain in joint, site unspecified 01/31/2011  . Restless legs syndrome (RLS) 09/12/2011  . Retinal detachment with retinal defect of right eye 2011   right eye twice  . Senile osteoporosis 01/31/2011  . Spontaneous ecchymoses 01/31/2011  . Stiffness of joints, not elsewhere classified, multiple sites 01/31/2011  . Unspecified essential hypertension 01/31/2011    Past Surgical History:  Procedure Laterality Date  . ABDOMINAL HYSTERECTOMY  06/21/2003   TAH/BSO, omenectomy PSB resect, Stg IC cystadenofibroma  . CHOLECYSTECTOMY  2005   Dr. Marlou Starks  . ELBOW SURGERY Right 2008   broken   Dr. Apolonio Schneiders  . EYE SURGERY    . RETINAL DETACHMENT SURGERY N/A    two  . REVERSE SHOULDER ARTHROPLASTY Left 05/06/2019   Procedure: REVERSE SHOULDER ARTHROPLASTY;  Surgeon: Justice Britain, MD;  Location: [facility];  Service: Orthopedics;  Laterality: Left;  170min  . ROTATOR CUFF REPAIR Right 2012   Dr. Theda Sers  . SQUAMOUS CELL CARCINOMA EXCISION Bilateral 2012, 8/14   [place] on legs   Dr. Sarajane Jews  . TONSILLECTOMY  1941  . VIDEO BRONCHOSCOPY WITH ENDOBRONCHIAL NAVIGATION N/A 11/29/2015  Procedure: VIDEO BRONCHOSCOPY WITH ENDOBRONCHIAL NAVIGATION;  Surgeon: Collene Gobble, MD;  Location: [facility] OR;  Service: Thoracic;  Laterality: N/A;    Allergies  Allergen Reactions  . Latex Other (See Comments)    Swelling  . Sulfa Antibiotics Nausea And Vomiting  . Tetracyclines & Related     Outpatient Encounter Medications as of 05/25/2019    Medication Sig  . acetaminophen (TYLENOL) 325 MG tablet Take 650 mg by mouth every 4 (four) hours as needed.  [name] alendronate (FOSAMAX) 70 MG tablet Take 70 mg by mouth once a week.  [name] aspirin 81 MG tablet Take 81 mg by mouth daily.  . cholecalciferol (VITAMIN D) 1000 units tablet Take 1 tablet (1,000 Units total) by mouth daily.  . hydrocortisone 2.5 % cream Apply 1 application topically as needed (for psoriasis).   [name] ketoconazole (NIZORAL) 2 % cream Apply 1 application topically as needed (for psoriasis).   [name] lamoTRIgine (LAMICTAL) 150 MG tablet TAKE 1 TABLET BY MOUTH TWICE DAILY.  [name] levETIRAcetam (KEPPRA) 500 MG tablet TAKE 1/2 TABLET IN THE AM AND 1 TABLET AT BEDTIME.  . memantine (NAMENDA) 10 MG tablet TAKE 1 TABLET BY MOUTH TWICE DAILY.  [name] potassium chloride SA (KLOR-CON) 20 MEQ tablet Take 20 mEq by mouth daily.  . pramipexole (MIRAPEX) 0.25 MG tablet Take 0.25 mg by mouth at bedtime.  [name] PROAIR HFA 108 (90 Base) MCG/ACT inhaler 2 PUFFS INTO THE LUNGS EVERY 4 HOURS IF NEEDED FOR WHEEZING/SHORTNESSOF BREATH.  . sennosides-docusate sodium (SENOKOT-S) 8.6-50 MG tablet Take 1 tablet by mouth daily.  . simvastatin (ZOCOR) 10 MG tablet Take 10 mg by mouth daily. Reported on 06/27/2015  . tiotropium (SPIRIVA HANDIHALER) 18 MCG inhalation capsule INHALE CONTENTS OF ONE CAPSULE ONCE DAILY FOR COPD.  [name] traMADol (ULTRAM) 50 MG tablet Take 1 tablet (50 mg total) by mouth every 6 (six) hours as needed for moderate pain.  [name] tuberculin (TUBERSOL) 5 UNIT/0.1ML injection Inject into the skin once.  . [DISCONTINUED] hydrochlorothiazide (MICROZIDE) 12.5 MG capsule Take 12.5 mg by mouth daily.   No facility-administered encounter medications on file as of 05/25/2019.    Review of Systems:  Review of Systems  Constitutional: Positive for activity change and appetite change.  HENT: Negative.   Respiratory: Negative.   Cardiovascular: Positive for leg swelling.  Gastrointestinal: Positive for constipation.   Genitourinary: Negative.   Musculoskeletal: Negative.   Skin: Negative.   Neurological: Positive for weakness.  Psychiatric/Behavioral: Positive for dysphoric mood.  All other systems reviewed and are negative.   Health Maintenance  Topic Date Due  . MAMMOGRAM  06/14/2015  . TETANUS/TDAP  04/08/2021  . INFLUENZA VACCINE  Completed  . DEXA SCAN  Completed  . PNA vac Low Risk Adult  Completed    Physical Exam: Vitals:   05/25/19 1246  BP: 124/74  Pulse: 84  Resp: 20  Temp: (!) 97.1 F (36.2 C)  TempSrc: Oral  SpO2: 93%  Weight: 156 lb 6.4 oz (70.9 kg)  Height: 5\' 2"  (1.575 m)   Body mass index is 28.61 kg/m. Physical Exam Vitals reviewed.  Constitutional:      Appearance: Normal appearance.  HENT:     Head: Normocephalic.     Nose: Nose normal.     Mouth/Throat:     Mouth: Mucous membranes are moist.     Pharynx: Oropharynx is clear.  Eyes:     Pupils: Pupils are equal, round, and reactive to light.  Cardiovascular:  Rate and Rhythm: Normal rate and regular rhythm.     Pulses: Normal pulses.     Heart sounds: Normal heart sounds.  Pulmonary:     Effort: Pulmonary effort is normal.     Breath sounds: Normal breath sounds.  Abdominal:     General: Abdomen is flat. Bowel sounds are normal.     Palpations: Abdomen is soft.  Musculoskeletal:        General: Swelling present.     Cervical back: Neck supple.  Skin:    General: Skin is warm.  Neurological:     General: No focal deficit present.     Mental Status: She is alert and oriented to person, place, and time.  Psychiatric:        Mood and Affect: Mood normal.        Thought Content: Thought content normal.     Labs reviewed: Basic Metabolic Panel: Recent Labs    05/08/19 0327 05/08/19 0327 05/09/19 [id number] 05/09/19 [id number] 05/10/19 0341 05/10/19 0341 05/12/19 0334 05/13/19 0403 05/20/19 0000  NA 138   < > 138   < > 139   < > 139 138 140  K 3.6   < > 3.3*   < > 3.5   < > 3.3* 3.6 3.4  CL  103   < > 100   < > 104   < > 104 105 102  CO2 26   < > 30   < > 28   < > 25 24 30*  GLUCOSE 106*   < > 104*   < > 99  --  97 101*  --   BUN 17   < > 15   < > 13   < > 19 16 14   CREATININE 0.48   < > 0.52   < > 0.55   < > 0.62 0.60 0.7  CALCIUM 8.3*   < > 8.4*   < > 8.7*   < > 8.7* 8.4* 8.9  MG 1.8  --  1.8  --  2.0  --   --   --   --   PHOS 2.1*  --  3.3  --  3.0  --   --   --   --    < > = values in this interval not displayed.   Liver Function Tests: Recent Labs    05/08/19 0327 05/08/19 0327 05/09/19 0338 05/10/19 0341 05/20/19 0000  AST 29   < > 26 25 16   ALT 23   < > 22 21 15   ALKPHOS 106   < > 97 91 254*  BILITOT 1.5*  --  1.1 1.4*  --   PROT 5.7*  --  5.6* 5.5*  --   ALBUMIN 3.1*   < > 2.9* 2.9* 3.5   < > = values in this interval not displayed.   No results for input(s): LIPASE, AMYLASE in the last 8760 hours. No results for input(s): AMMONIA in the last 8760 hours. CBC: Recent Labs    06/16/18 1056 05/04/19 0025 05/04/19 0449 05/11/19 0422 05/11/19 0422 05/12/19 0334 05/13/19 0403 05/20/19 0000  WBC 6.0 13.3*   < > 7.6   < > 7.4 8.3 5.9  NEUTROABS 3.7 11.3*  --   --   --   --   --  4,260  HGB 15.1 13.6   < > 10.8*   < > 10.8* 11.3* 12.1  HCT 44.8 41.6   < > 33.4*   < >  33.6* 36.2 36  MCV 89 93.7   < > 95.4  --  95.2 97.1  --   PLT 218 198   < > 220   < > 247 275 364   < > = values in this interval not displayed.   Lipid Panel: No results for input(s): CHOL, HDL, LDLCALC, TRIG, CHOLHDL, LDLDIRECT in the last 8760 hours. No results found for: HGBA1C  Procedures since last visit: DG Chest 2 View  Result Date: 05/03/2019 CLINICAL DATA:  Chest pain after fall EXAM: CHEST - 2 VIEW COMPARISON:  11/29/2015 FINDINGS: Low lung volumes with linear scarring or atelectasis at the bases. Stable cardiomediastinal silhouette with aortic atherosclerosis. No pneumothorax. Displaced fracture involving the proximal left humerus. IMPRESSION: 1. Low lung volumes with  atelectasis or scarring at the bases 2. Displaced proximal left humerus fracture Electronically Signed   By: Donavan Foil M.D.   On: 05/03/2019 22:02   DG Abd 1 View  Result Date: 05/09/2019 CLINICAL DATA:  Abdominal bloating. EXAM: ABDOMEN - 1 VIEW COMPARISON:  None. FINDINGS: The patient's left hand overlies left side of the abdomen and could not be moved due to recent shoulder surgery. There appears to be some fecal loading in the distal colon/rectum. No evidence of bowel obstruction. Calcifications in the right upper quadrant could represent renal stones. No other acute abnormalities. IMPRESSION: Fecal loading in the rectum and sigmoid colon. No bowel obstruction noted. Calcifications in the right upper quadrant may represent renal stones. Electronically Signed   By: Dorise Bullion III M.D   On: 05/09/2019 11:29   CT Head Wo Contrast  Result Date: 05/03/2019 CLINICAL DATA:  Fall, facial lacerations EXAM: CT HEAD WITHOUT CONTRAST CT MAXILLOFACIAL WITHOUT CONTRAST CT CERVICAL SPINE WITHOUT CONTRAST TECHNIQUE: Multidetector CT imaging of the head, cervical spine, and maxillofacial structures were performed using the standard protocol without intravenous contrast. Multiplanar CT image reconstructions of the cervical spine and maxillofacial structures were also generated. COMPARISON:  MR head 12/08/2012, CT head 08/16/2009 FINDINGS: CT HEAD FINDINGS Brain: Punctate foci of hypoattenuation the bilateral basal ganglia likely reflect sequela of remote lacunar infarcts several of which were present on comparison CT from 20/11. There mixed patchy and confluent areas of hypoattenuation throughout the subcortical deep and periventricular white matter, similar to slightly advanced from comparison in most compatible with chronic microvascular angiopathy. Symmetric prominence of the ventricles, cisterns and sulci compatible with parenchymal volume loss. Slight ex vacuo dilatation of the ventricles. No convincing  evidence of acute infarction, hemorrhage, frank hydrocephalus, extra-axial collection or mass lesion/mass effect. Vascular: Atherosclerotic calcification of the carotid siphons. No hyperdense vessel. Skull: No calvarial fracture, scalp swelling or hematoma. Other: None CT MAXILLOFACIAL FINDINGS Osseous: The osseous structures appear diffusely demineralized which may limit detection of small or nondisplaced fractures. No fracture of the bony orbits. Nasal bones are intact. No mid face fractures are seen. The pterygoid plates are intact. There is a comminuted fracture of the right mandibular condyle which appears impacted and slightly anteriorly displaced relative to the contralateral side. No other mandibular fracture is identified. The left temporomandibular joint is more normally aligned. 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 No temporal bone fractures are identified. There is complete absence of the mandibular dentition. Maxillary dental bridge is noted with several dental implants. Orbits: The globes appear normal and symmetric with evidence of prior bilateral lens extractions. Symmetric appearance of the extraocular musculature and optic nerve sheath complexes. Normal caliber of the superior ophthalmic veins. Sinuses:  Minimal thickening in the maxillary and ethmoidal sinuses. No air-fluid levels. Mastoid air cells are well aerated, better seen on CT images head. Middle ear cavities are clear. Ossicular chains appear normally configured. Soft tissues: Some pre mandibular soft tissue swelling and thickening is noted, compatible with the reported site of laceration. No other acute soft tissue abnormality is seen. CT CERVICAL SPINE FINDINGS Alignment: Slight exaggeration of the normal cervical lordosis is likely positional. Stabilization collar is not visualized at the time of exam. There is narrowing of the basion dens interval with developing and no a pseudoarticulation which is  a variant finding in can be seen in degenerative change. There is slight stepwise retrolisthesis of C3-C5 on a likely degenerative basis given spondylitic and facet changes at the involved levels. No traumatic listhesis. No abnormally widened, perched or jumped facets. Skull base and vertebrae: No acute fracture. No primary bone lesion or focal pathologic process. Soft tissues and spinal canal: No pre or paravertebral fluid or swelling. No visible canal hematoma. Disc levels: Multilevel intervertebral disc height loss with spondylitic endplate changes. Disc osteophyte complexes at the C2-C6 levels efface the ventral thecal sac with at most mild canal stenosis. Uncinate spurring and facet hypertrophic changes result in mild-to-moderate multilevel neural foraminal narrowing with slightly more severe narrowing on the right at C3-4 and C4-5. Upper chest: No acute abnormality in the upper chest or imaged lung apices. Other: Slightly heterogeneous enlarged thyroid with few punctate calcifications. IMPRESSION: 1. No acute intracranial abnormality. No scalp swelling or calvarial fracture. 2. 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 3. Chronic microvascular angiopathic changes and parenchymal volume loss, somewhat progressed since comparison studies. 4. Comminuted, impacted and anteriorly displaced fracture of the right mandibular condyle. No other mandibular fracture identified. Associated pre mandibular soft tissue swelling which likely correlates with site of reported laceration. 5. No other facial bone fracture. 6. No acute fracture or traumatic listhesis of the cervical spine. 7. Multilevel intervertebral disc height loss and spondylitic changes detailed above. 8. Heterogeneous, nodular thyroid. If further evaluation is warranted on a clinical basis following discussion of patient's risk factors and comorbidities, consider follow-up ultrasound. This follows consensus guidelines: Managing Incidental  Thyroid Nodules Detected on Imaging: White Paper of the ACR Incidental Thyroid Findings Committee. J Am Coll Radiol 2015; 12:143-150. and [facility] 3-tiered system for managing ITNs: J Am Coll Radiol. 2015; Feb;12(2): 143-50 Electronically Signed   By: Lovena Le M.D.   On: 05/03/2019 22:34   CT Cervical Spine Wo Contrast  Result Date: 05/03/2019 CLINICAL DATA:  Fall, facial lacerations EXAM: CT HEAD WITHOUT CONTRAST CT MAXILLOFACIAL WITHOUT CONTRAST CT CERVICAL SPINE WITHOUT CONTRAST TECHNIQUE: Multidetector CT imaging of the head, cervical spine, and maxillofacial structures were performed using the standard protocol without intravenous contrast. Multiplanar CT image reconstructions of the cervical spine and maxillofacial structures were also generated. COMPARISON:  MR head 12/08/2012, CT head 08/16/2009 FINDINGS: CT HEAD FINDINGS Brain: Punctate foci of hypoattenuation the bilateral basal ganglia likely reflect sequela of remote lacunar infarcts several of which were present on comparison CT from 20/11. There mixed patchy and confluent areas of hypoattenuation throughout the subcortical deep and periventricular white matter, similar to slightly advanced from comparison in most compatible with chronic microvascular angiopathy. Symmetric prominence of the ventricles, cisterns and sulci compatible with parenchymal volume loss. Slight ex vacuo dilatation of the ventricles. No convincing evidence of acute infarction, hemorrhage, frank hydrocephalus, extra-axial collection or mass lesion/mass effect. Vascular: Atherosclerotic calcification of the carotid siphons. No hyperdense  vessel. Skull: No calvarial fracture, scalp swelling or hematoma. Other: None CT MAXILLOFACIAL FINDINGS Osseous: The osseous structures appear diffusely demineralized which may limit detection of small or nondisplaced fractures. No fracture of the bony orbits. Nasal bones are intact. No mid face fractures are seen. The pterygoid plates are  intact. There is a comminuted fracture of the right mandibular condyle which appears impacted and slightly anteriorly displaced relative to the contralateral side. No other mandibular fracture is identified. The left temporomandibular joint is more normally aligned. 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 No temporal bone fractures are identified. There is complete absence of the mandibular dentition. Maxillary dental bridge is noted with several dental implants. Orbits: The globes appear normal and symmetric with evidence of prior bilateral lens extractions. Symmetric appearance of the extraocular musculature and optic nerve sheath complexes. Normal caliber of the superior ophthalmic veins. Sinuses: Minimal thickening in the maxillary and ethmoidal sinuses. No air-fluid levels. Mastoid air cells are well aerated, better seen on CT images head. Middle ear cavities are clear. Ossicular chains appear normally configured. Soft tissues: Some pre mandibular soft tissue swelling and thickening is noted, compatible with the reported site of laceration. No other acute soft tissue abnormality is seen. CT CERVICAL SPINE FINDINGS Alignment: Slight exaggeration of the normal cervical lordosis is likely positional. Stabilization collar is not visualized at the time of exam. There is narrowing of the basion dens interval with developing and no a pseudoarticulation which is a variant finding in can be seen in degenerative change. There is slight stepwise retrolisthesis of C3-C5 on a likely degenerative basis given spondylitic and facet changes at the involved levels. No traumatic listhesis. No abnormally widened, perched or jumped facets. Skull base and vertebrae: No acute fracture. No primary bone lesion or focal pathologic process. Soft tissues and spinal canal: No pre or paravertebral fluid or swelling. No visible canal hematoma. Disc levels: Multilevel intervertebral disc height loss with  spondylitic endplate changes. Disc osteophyte complexes at the C2-C6 levels efface the ventral thecal sac with at most mild canal stenosis. Uncinate spurring and facet hypertrophic changes result in mild-to-moderate multilevel neural foraminal narrowing with slightly more severe narrowing on the right at C3-4 and C4-5. Upper chest: No acute abnormality in the upper chest or imaged lung apices. Other: Slightly heterogeneous enlarged thyroid with few punctate calcifications. IMPRESSION: 1. No acute intracranial abnormality. No scalp swelling or calvarial fracture. 2. 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 3. Chronic microvascular angiopathic changes and parenchymal volume loss, somewhat progressed since comparison studies. 4. Comminuted, impacted and anteriorly displaced fracture of the right mandibular condyle. No other mandibular fracture identified. Associated pre mandibular soft tissue swelling which likely correlates with site of reported laceration. 5. No other facial bone fracture. 6. No acute fracture or traumatic listhesis of the cervical spine. 7. Multilevel intervertebral disc height loss and spondylitic changes detailed above. 8. Heterogeneous, nodular thyroid. If further evaluation is warranted on a clinical basis following discussion of patient's risk factors and comorbidities, consider follow-up ultrasound. This follows consensus guidelines: Managing Incidental Thyroid Nodules Detected on Imaging: White Paper of the ACR Incidental Thyroid Findings Committee. J Am Coll Radiol 2015; 12:143-150. and [facility] 3-tiered system for managing ITNs: J Am Coll Radiol. 2015; Feb;12(2): 143-50 Electronically Signed   By: Lovena Le M.D.   On: 05/03/2019 22:34   DG CHEST PORT 1 VIEW  Result Date: 05/10/2019 CLINICAL DATA:  Worsening oxygen saturation. EXAM: PORTABLE CHEST 1 VIEW COMPARISON:  05/03/2019  FINDINGS: Worsening atelectasis in both lower lobes, worse on the left than the right. There could  be developing pleural effusions. Upper lungs appear clear. IMPRESSION: Worsening atelectasis in both lower lobes, left more than right. Possible developing effusions. Electronically Signed   By: Nelson Chimes M.D.   On: 05/10/2019 10:52   DG Shoulder Left  Result Date: 05/03/2019 CLINICAL DATA:  Fall with shoulder pain and deformity EXAM: LEFT SHOULDER - 2+ VIEW COMPARISON:  None. FINDINGS: AC joint is intact. Acute comminuted humeral neck fracture with close to 1 shaft diameter of displacement for the midline. Moderate valgus angulation of distal fracture fragment. Mildly displaced greater tuberosity fracture fragment. IMPRESSION: Acute displaced and angulated proximal humerus fracture involving the humeral neck and greater tuberosity. Electronically Signed   By: Donavan Foil M.D.   On: 05/03/2019 22:04   CT Maxillofacial WO CM  Result Date: 05/03/2019 CLINICAL DATA:  Fall, facial lacerations EXAM: CT HEAD WITHOUT CONTRAST CT MAXILLOFACIAL WITHOUT CONTRAST CT CERVICAL SPINE WITHOUT CONTRAST TECHNIQUE: Multidetector CT imaging of the head, cervical spine, and maxillofacial structures were performed using the standard protocol without intravenous contrast. Multiplanar CT image reconstructions of the cervical spine and maxillofacial structures were also generated. COMPARISON:  MR head 12/08/2012, CT head 08/16/2009 FINDINGS: CT HEAD FINDINGS Brain: Punctate foci of hypoattenuation the bilateral basal ganglia likely reflect sequela of remote lacunar infarcts several of which were present on comparison CT from 20/11. There mixed patchy and confluent areas of hypoattenuation throughout the subcortical deep and periventricular white matter, similar to slightly advanced from comparison in most compatible with chronic microvascular angiopathy. Symmetric prominence of the ventricles, cisterns and sulci compatible with parenchymal volume loss. Slight ex vacuo dilatation of the ventricles. No convincing evidence of  acute infarction, hemorrhage, frank hydrocephalus, extra-axial collection or mass lesion/mass effect. Vascular: Atherosclerotic calcification of the carotid siphons. No hyperdense vessel. Skull: No calvarial fracture, scalp swelling or hematoma. Other: None CT MAXILLOFACIAL FINDINGS Osseous: The osseous structures appear diffusely demineralized which may limit detection of small or nondisplaced fractures. No fracture of the bony orbits. Nasal bones are intact. No mid face fractures are seen. The pterygoid plates are intact. There is a comminuted fracture of the right mandibular condyle which appears impacted and slightly anteriorly displaced relative to the contralateral side. No other mandibular fracture is identified. The left temporomandibular joint is more normally aligned. 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 No temporal bone fractures are identified. There is complete absence of the mandibular dentition. Maxillary dental bridge is noted with several dental implants. Orbits: The globes appear normal and symmetric with evidence of prior bilateral lens extractions. Symmetric appearance of the extraocular musculature and optic nerve sheath complexes. Normal caliber of the superior ophthalmic veins. Sinuses: Minimal thickening in the maxillary and ethmoidal sinuses. No air-fluid levels. Mastoid air cells are well aerated, better seen on CT images head. Middle ear cavities are clear. Ossicular chains appear normally configured. Soft tissues: Some pre mandibular soft tissue swelling and thickening is noted, compatible with the reported site of laceration. No other acute soft tissue abnormality is seen. CT CERVICAL SPINE FINDINGS Alignment: Slight exaggeration of the normal cervical lordosis is likely positional. Stabilization collar is not visualized at the time of exam. There is narrowing of the basion dens interval with developing and no a pseudoarticulation which is a variant  finding in can be seen in degenerative change. There is slight stepwise retrolisthesis of C3-C5 on a likely degenerative basis given spondylitic and facet  changes at the involved levels. No traumatic listhesis. No abnormally widened, perched or jumped facets. Skull base and vertebrae: No acute fracture. No primary bone lesion or focal pathologic process. Soft tissues and spinal canal: No pre or paravertebral fluid or swelling. No visible canal hematoma. Disc levels: Multilevel intervertebral disc height loss with spondylitic endplate changes. Disc osteophyte complexes at the C2-C6 levels efface the ventral thecal sac with at most mild canal stenosis. Uncinate spurring and facet hypertrophic changes result in mild-to-moderate multilevel neural foraminal narrowing with slightly more severe narrowing on the right at C3-4 and C4-5. Upper chest: No acute abnormality in the upper chest or imaged lung apices. Other: Slightly heterogeneous enlarged thyroid with few punctate calcifications. IMPRESSION: 1. No acute intracranial abnormality. No scalp swelling or calvarial fracture. 2. 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 3. Chronic microvascular angiopathic changes and parenchymal volume loss, somewhat progressed since comparison studies. 4. Comminuted, impacted and anteriorly displaced fracture of the right mandibular condyle. No other mandibular fracture identified. Associated pre mandibular soft tissue swelling which likely correlates with site of reported laceration. 5. No other facial bone fracture. 6. No acute fracture or traumatic listhesis of the cervical spine. 7. Multilevel intervertebral disc height loss and spondylitic changes detailed above. 8. Heterogeneous, nodular thyroid. If further evaluation is warranted on a clinical basis following discussion of patient's risk factors and comorbidities, consider follow-up ultrasound. This follows consensus guidelines: Managing Incidental Thyroid Nodules  Detected on Imaging: White Paper of the ACR Incidental Thyroid Findings Committee. J Am Coll Radiol 2015; 12:143-150. and [facility] 3-tiered system for managing ITNs: J Am Coll Radiol. 2015; Feb;12(2): 143-50 Electronically Signed   By: Lovena Le M.D.   On: 05/03/2019 22:34    Assessment/Plan  ? Dizziness / Weakness Better since Norvasc discontinued BP is controlled  LE edema Continues to be issue Will Discontinue HCTZ and try Lasix 20mg  3/week with potassium Repeat BMP in 2 weeks  Closed fracture of proximal end of left humerus, S/P Arthroplasty Follow up with Dr Onnie Graham Pain seems controlled with Tylenol Discontinue Tramadol Doing well with therapy  S/p Right Madibular fracture Continue Liquid diet for 4 weeks till 02/26 Patient has lost some weight as she does not like the food D/W speech Therapy and Dr Santiago Bur  Essential hypertension Have discontinued Norvasc and HCTZ due to dizziness and starting on Lasix  Depression She says she has been feeling depressed and missing her apartment Right now she does not want me to start her on any meds Will continue supportive care  COPD Continue Spiriva and Proair Off her oxygen now  Seizure disorder ([place]) Seems Controlled on Lamictal and Kepprra  Senile osteoporosis Not sure if she will  able to take her Fosamax But will continue for now  Pneumonia of both lower lobes [facility] Urinary retention Doing well. Taken off Flomax due to dizziness  Restless Leg On Mirapex Hyperlipidemia Continue Statin Cognitive Impairment On Namenda MMSE was 30/30 in Neurology Office H/o Thyroid Nodule Will need follow up as outpatient  Labs/tests ordered:  * No order type specified * Next appt:  Visit date not found Total time spent in this patient care encounter was  45_  minutes; greater than 50% of the visit spent counseling patient and staff, reviewing records , Labs and coordinating care for problems addressed at this  encounter.

## 2019-05-26 LAB — BASIC METABOLIC PANEL
BUN: 16 (ref 4–21)
CO2: 31 — AB (ref 13–22)
Chloride: 102 (ref 99–108)
Creatinine: 0.9 (ref 0.5–1.1)
Glucose: 84
Potassium: 4 (ref 3.4–5.3)
Sodium: 142 (ref 137–147)

## 2019-05-26 LAB — COMPREHENSIVE METABOLIC PANEL
Calcium: 9.6 (ref 8.7–10.7)
GFR calc Af Amer: 67
GFR calc non Af Amer: 58

## 2019-06-01 ENCOUNTER — Institutional Professional Consult (permissible substitution): Payer: Medicare PPO | Admitting: Plastic Surgery

## 2019-06-03 ENCOUNTER — Encounter: Payer: Self-pay | Admitting: Internal Medicine

## 2019-06-03 ENCOUNTER — Non-Acute Institutional Stay (SKILLED_NURSING_FACILITY): Payer: Medicare PPO | Admitting: Internal Medicine

## 2019-06-03 DIAGNOSIS — R531 Weakness: Secondary | ICD-10-CM

## 2019-06-03 DIAGNOSIS — R609 Edema, unspecified: Secondary | ICD-10-CM | POA: Diagnosis not present

## 2019-06-03 DIAGNOSIS — S02611D Fracture of condylar process of right mandible, subsequent encounter for fracture with routine healing: Secondary | ICD-10-CM | POA: Diagnosis not present

## 2019-06-03 DIAGNOSIS — I10 Essential (primary) hypertension: Secondary | ICD-10-CM

## 2019-06-03 NOTE — Progress Notes (Signed)
Location:  [facility] Room Number: 35-A Place of Service:  SNF [phone]) Provider:  Veleta Miners, MD  Patient Care Team: Leighton Ruff, MD as PCP - General (Family Medicine) [name], MD as Consulting Physician (Ophthalmology) [name], MD as Consulting Physician (Cardiology) [name], MD as Consulting Physician (Dermatology) [name], MD as Consulting Physician (Orthopedic Surgery) [name], MD as Consulting Physician (Orthopedic Surgery) [name], MD as Consulting Physician (Orthopedic Surgery) [place], [place] Mast, Man X, NP as Nurse Practitioner (Nurse Practitioner) [name], MD as Consulting Physician (Neurology)  Extended Emergency Contact Information Primary Emergency Contact: [name],[name] Address: [address]          [place] [address] [place] Phone: [phone] Relation: Sister Secondary Emergency Contact: [name] [place] Phone: [phone] Relation: Sister  Code Status:  DNR Goals of care: Advanced Directive information Advanced Directives 05/25/2019  Does Patient Have a Medical Advance Directive? Yes  Type of Advance Directive Out of facility DNR (pink MOST or yellow form)  Does patient want to make changes to medical advance directive? No - Patient declined  Copy of [place] in Chart? -  Pre-existing out of facility DNR order (yellow form or pink MOST form) Pink MOST/Yellow Form most recent copy in chart - Physician notified to receive inpatient order     Chief Complaint  Patient presents with  . Acute Visit    Patient seen for followup of lower extremity edema    HPI:  Pt is an 86 y.o. female seen today for an acute visit for Follow up of LE edema, Weakness and Discharge Planning Patient is in SNF for Therapy after she fell and sustained Left Humerus Fracture with Displacement needing Arthroplasty ,  Right Mandibular Fracture, Pneumonia and Urinary retention. Stayed in the Hospital from 1/25-2/4  Patient also has h/o COPD, Hypertension,Seizure Disorder,Mild Cognitive impairment, Hyperlipidemia Her Active problems Weakness ? Dizziness She was having Episodes when she was having BM which seem like she is going to Pass out or Fall. Her Norvasc and Flomax and eventually HCTZ was discontinued. She feels much better still worried about Falling but not Dizzy anymore  LE edema Was started on Lasix 20mg  3/week Doing well. Very mild Swelling. Has lost 3 lbs Left Shoulder Arthroplasty Doing well. Pain Controlled oN tylenol S/p Mandibular Fracture On strict Liquid diet for 4 weeks  Discharge Planning Planning to go to Assisted living wants to discuss options  Past Medical History:  Diagnosis Date  . Acute bronchitis 05/23/2011  . Acute upper respiratory infections of unspecified site 05/23/2011  . Arthritis   . Chronic airway obstruction, not elsewhere classified 05/23/2011  . Disturbance of salivary secretion 01/31/2011  . Dizziness and giddiness 01/31/2011  . Essential tremor 04/25/2014  . External hemorrhoids without mention of complication [id number]  . Gait disorder 04/25/2014  . Insomnia, unspecified 09/12/2011  . Lumbago 01/31/2011  . Major depressive disorder, single episode, unspecified 01/31/2011  . Memory disorder 04/25/2014  . Mitral valve disorders(424.0) 01/31/2011  . Other and unspecified hyperlipidemia 01/31/2011  . Other convulsions 01/31/2011  . Other emphysema ([place]) 01/31/2011  . Pain in joint, site unspecified 01/31/2011  . Restless legs syndrome (RLS) 09/12/2011  . Retinal detachment with retinal defect of right eye 2011   right eye twice  . Senile osteoporosis 01/31/2011  . Spontaneous ecchymoses 01/31/2011  . Stiffness of joints, not elsewhere classified, multiple sites 01/31/2011  . Unspecified  essential hypertension 01/31/2011   Past Surgical History:  Procedure  Laterality Date  . ABDOMINAL HYSTERECTOMY  06/21/2003   TAH/BSO, omenectomy PSB resect, Stg IC cystadenofibroma  . CHOLECYSTECTOMY  2005   Dr. Marlou Starks  . ELBOW SURGERY Right 2008   broken   Dr. Apolonio Schneiders  . EYE SURGERY    . RETINAL DETACHMENT SURGERY N/A    two  . REVERSE SHOULDER ARTHROPLASTY Left 05/06/2019   Procedure: REVERSE SHOULDER ARTHROPLASTY;  Surgeon: Justice Britain, MD;  Location: [facility];  Service: Orthopedics;  Laterality: Left;  14min  . ROTATOR CUFF REPAIR Right 2012   Dr. Theda Sers  . SQUAMOUS CELL CARCINOMA EXCISION Bilateral 2012, 8/14   [place] on legs   Dr. Sarajane Jews  . TONSILLECTOMY  1941  . VIDEO BRONCHOSCOPY WITH ENDOBRONCHIAL NAVIGATION N/A 11/29/2015   Procedure: VIDEO BRONCHOSCOPY WITH ENDOBRONCHIAL NAVIGATION;  Surgeon: Collene Gobble, MD;  Location: [facility] OR;  Service: Thoracic;  Laterality: N/A;    Allergies  Allergen Reactions  . Latex Other (See Comments)    Swelling  . Sulfa Antibiotics Nausea And Vomiting  . Tetracyclines & Related     Outpatient Encounter Medications as of 06/03/2019  Medication Sig  . acetaminophen (TYLENOL) 325 MG tablet Take 650 mg by mouth every 4 (four) hours as needed for mild pain.  [name] alendronate (FOSAMAX) 70 MG tablet Take 70 mg by mouth once a week. Take on Mondays  . aspirin 81 MG tablet Take 81 mg by mouth daily.  . cholecalciferol (VITAMIN D) 1000 units tablet Take 1 tablet (1,000 Units total) by mouth daily.  . furosemide (LASIX) 20 MG tablet Take 20 mg by mouth every Monday, Wednesday, and Friday.  . hydrocortisone 2.5 % cream Apply 1 application topically as needed (for psoriasis).   [name] ketoconazole (NIZORAL) 2 % cream Apply 1 application topically as needed (for psoriasis).   [name] lamoTRIgine (LAMICTAL) 150 MG tablet TAKE 1 TABLET BY MOUTH TWICE DAILY.  [name] levETIRAcetam (KEPPRA) 500 MG tablet TAKE 1/2 TABLET IN THE AM AND 1 TABLET AT BEDTIME.  . memantine (NAMENDA) 10 MG tablet TAKE 1 TABLET BY MOUTH TWICE DAILY.  [name] polyethylene  glycol (MIRALAX / GLYCOLAX) 17 g packet Take 17 g by mouth at bedtime.  . potassium chloride (KLOR-CON) 10 MEQ tablet Take 10 mEq by mouth every Monday, Wednesday, and Friday.  . pramipexole (MIRAPEX) 0.25 MG tablet Take 0.25 mg by mouth at bedtime.  [name] PROAIR HFA 108 (90 Base) MCG/ACT inhaler 2 PUFFS INTO THE LUNGS EVERY 4 HOURS IF NEEDED FOR WHEEZING/SHORTNESSOF BREATH.  . sennosides-docusate sodium (SENOKOT-S) 8.6-50 MG tablet Take 1 tablet by mouth daily.  . simvastatin (ZOCOR) 10 MG tablet Take 10 mg by mouth daily. Reported on 06/27/2015  . tiotropium (SPIRIVA HANDIHALER) 18 MCG inhalation capsule INHALE CONTENTS OF ONE CAPSULE ONCE DAILY FOR COPD.  . [DISCONTINUED] hydrochlorothiazide (HYDRODIURIL) 25 MG tablet Take 25 mg by mouth daily.  . [DISCONTINUED] potassium chloride SA (KLOR-CON) 20 MEQ tablet Take 20 mEq by mouth daily.  . [DISCONTINUED] traMADol (ULTRAM) 50 MG tablet Take 1 tablet (50 mg total) by mouth every 6 (six) hours as needed for moderate pain.   No facility-administered encounter medications on file as of 06/03/2019.    Review of Systems  Review of Systems  Constitutional: Negative for activity change,  chills, diaphoresis, fatigue and fever.  HENT: Negative for mouth sores, postnasal drip, rhinorrhea, sinus pain and sore throat.   Respiratory: Negative for apnea, cough, chest tightness, shortness of  breath and wheezing.   Cardiovascular: Negative for chest pain, palpitations and leg swelling.  Gastrointestinal: Negative for abdominal distention, abdominal pain, constipation, diarrhea, nausea and vomiting.  Genitourinary: Negative for dysuria and frequency.  Musculoskeletal: Negative for arthralgias, joint swelling and myalgias.  Skin: Negative for rash.  Neurological: Negative for dizziness, syncope, weakness, light-headedness and numbness.  Psychiatric/Behavioral: Negative for behavioral problems, confusion and sleep disturbance.     Immunization History    Administered Date(s) Administered  . Influenza Split 01/06/2014  . Influenza Whole 01/07/2012, 01/06/2013  . Influenza, High Dose Seasonal PF 12/25/2015, 01/20/2017  . Influenza,inj,Quad PF,6+ Mos 12/21/2014  . Influenza-Unspecified 12/09/2018  . Pneumococcal Conjugate-13 02/01/2014  . Pneumococcal Polysaccharide-23 04/08/2004  . Td 04/08/2002  . Tdap 04/09/2011  . Zoster 04/08/2008  . Zoster Recombinat (Shingrix) 08/27/2017   Pertinent  Health Maintenance Due  Topic Date Due  . MAMMOGRAM  06/14/2015  . INFLUENZA VACCINE  Completed  . DEXA SCAN  Completed  . PNA vac Low Risk Adult  Completed   Fall Risk  03/24/2014 10/15/2012  Falls in the past year? No No   Functional Status Survey:    Vitals:   06/03/19 1251  BP: 120/70  Pulse: 87  Resp: 20  Temp: 97.8 F (36.6 C)  TempSrc: Oral  SpO2: 95%  Weight: 153 lb 9.6 oz (69.7 kg)  Height: 5\' 2"  (1.575 m)   Body mass index is 28.09 kg/m. Physical Exam  Constitutional: Oriented to person, place, and time. Well-developed and well-nourished.  HENT:  Head: Normocephalic.  Mouth/Throat: Oropharynx is clear and moist.  Eyes: Pupils are equal, round, and reactive to light.  Neck: Neck supple.  Cardiovascular: Normal rate and normal heart sounds.  No murmur heard. Pulmonary/Chest: Effort normal and breath sounds normal. No respiratory distress. No wheezes. She has no rales.  Abdominal: Soft. Bowel sounds are normal. No distension. There is no tenderness. There is no rebound.  Musculoskeletal: Mild Edema. Bilateral. Left Shoulder in the Strap. Lymphadenopathy: none Neurological: Alert and oriented to person, place, and time.  Skin: Skin is warm and dry.  Psychiatric: Normal mood and affect. Behavior is normal. Thought content normal.    Labs reviewed: Recent Labs    05/08/19 0327 05/08/19 0327 05/09/19 0338 05/09/19 0338 05/10/19 0341 05/10/19 0341 05/12/19 0334 05/12/19 0334 05/13/19 0403 05/20/19 0000  05/26/19 0000  NA 138   < > 138   < > 139   < > 139   < > 138 140 142  K 3.6   < > 3.3*   < > 3.5   < > 3.3*   < > 3.6 3.4 4.0  CL 103   < > 100   < > 104   < > 104   < > 105 102 102  CO2 26   < > 30   < > 28   < > 25   < > 24 30* 31*  GLUCOSE 106*   < > 104*   < > 99  --  97  --  101*  --   --   BUN 17   < > 15   < > 13   < > 19   < > 16 14 16   CREATININE 0.48   < > 0.52   < > 0.55   < > 0.62   < > 0.60 0.7 0.9  CALCIUM 8.3*   < > 8.4*   < > 8.7*   < > 8.7*   < >  8.4* 8.9 9.6  MG 1.8  --  1.8  --  2.0  --   --   --   --   --   --   PHOS 2.1*  --  3.3  --  3.0  --   --   --   --   --   --    < > = values in this interval not displayed.   Recent Labs    05/08/19 0327 05/08/19 0327 05/09/19 0338 05/10/19 0341 05/20/19 0000  AST 29   < > 26 25 16   ALT 23   < > 22 21 15   ALKPHOS 106   < > 97 91 254*  BILITOT 1.5*  --  1.1 1.4*  --   PROT 5.7*  --  5.6* 5.5*  --   ALBUMIN 3.1*   < > 2.9* 2.9* 3.5   < > = values in this interval not displayed.   Recent Labs    06/16/18 1056 05/04/19 0025 05/04/19 0449 05/11/19 0422 05/11/19 0422 05/12/19 0334 05/13/19 0403 05/20/19 0000  WBC 6.0 13.3*   < > 7.6   < > 7.4 8.3 5.9  NEUTROABS 3.7 11.3*  --   --   --   --   --  4,260  HGB 15.1 13.6   < > 10.8*   < > 10.8* 11.3* 12.1  HCT 44.8 41.6   < > 33.4*   < > 33.6* 36.2 36  MCV 89 93.7   < > 95.4  --  95.2 97.1  --   PLT 218 198   < > 220   < > 247 275 364   < > = values in this interval not displayed.   Lab Results  Component Value Date   TSH 1.51 02/28/2016   No results found for: HGBA1C Lab Results  Component Value Date   CHOL 249 (H) 06/27/2015   HDL 70 06/27/2015   LDLCALC 160 (H) 06/27/2015   TRIG 97 06/27/2015   CHOLHDL 3.6 06/27/2015    Significant Diagnostic Results in last 30 days:  DG Abd 1 View  Result Date: 05/09/2019 CLINICAL DATA:  Abdominal bloating. EXAM: ABDOMEN - 1 VIEW COMPARISON:  None. FINDINGS: The patient's left hand overlies left side of the abdomen  and could not be moved due to recent shoulder surgery. There appears to be some fecal loading in the distal colon/rectum. No evidence of bowel obstruction. Calcifications in the right upper quadrant could represent renal stones. No other acute abnormalities. IMPRESSION: Fecal loading in the rectum and sigmoid colon. No bowel obstruction noted. Calcifications in the right upper quadrant may represent renal stones. Electronically Signed   By: Dorise Bullion III M.D   On: 05/09/2019 11:29   DG CHEST PORT 1 VIEW  Result Date: 05/10/2019 CLINICAL DATA:  Worsening oxygen saturation. EXAM: PORTABLE CHEST 1 VIEW COMPARISON:  05/03/2019 FINDINGS: Worsening atelectasis in both lower lobes, worse on the left than the right. There could be developing pleural effusions. Upper lungs appear clear. IMPRESSION: Worsening atelectasis in both lower lobes, left more than right. Possible developing effusions. Electronically Signed   By: Nelson Chimes M.D.   On: 05/10/2019 10:52    Assessment/Plan ? Dizziness / Weakness Better off Norvasc and Flomax Still worried about Fall. Will continue to monitor  LE edema Doing well on Lasix Off HCTZ Repeat BMP is Pending  Closed fracture of proximal end of left humerus, S/P Arthroplasty Doing Well per Ortho Pain Controlled  working with therapy S/p Right Madibular fracture Continue Liquid diet for 4 weeks till 02/26 Patient has lost some weight as she does not like the food D/W speech Therapy and Dr Santiago Bur Follow up with Dr Santiago Bur before up grading Diet  Essential hypertension Have discontinued Norvasc and HCTZ due to dizziness and starting on Lasix Discharge Planning [place] to go to [place] which I think [facility] be good place for her after she gets more stronger with therapy here   Other Issues  Depression She says she has been feeling depressed and missing her apartment Right now she does not want me to start her on any meds Will continue supportive  care  COPD Continue Spiriva and Proair Off her oxygen now  Seizure disorder ([place]) Seems Controlled on Lamictal and Kepprra  Senile osteoporosis Not sure if she will able to take her Fosamax But will continue for now  Pneumonia of both lower lobes [facility] Urinary retention Doing well. Taken off Flomax due to dizziness  Restless Leg On Mirapex Hyperlipidemia Continue Statin Cognitive Impairment On Namenda MMSE was 30/30 in Neurology Office H/o Thyroid Nodule Will need follow up as outpatient  Family/ staff Communication:   Labs/tests ordered:

## 2019-06-08 ENCOUNTER — Ambulatory Visit (INDEPENDENT_AMBULATORY_CARE_PROVIDER_SITE_OTHER): Payer: Medicare PPO | Admitting: Plastic Surgery

## 2019-06-08 ENCOUNTER — Encounter: Payer: Self-pay | Admitting: Plastic Surgery

## 2019-06-08 ENCOUNTER — Other Ambulatory Visit: Payer: Self-pay

## 2019-06-08 VITALS — BP 135/87 | HR 67 | Temp 97.3°F | Ht 65.0 in | Wt 156.0 lb

## 2019-06-08 DIAGNOSIS — S02611D Fracture of condylar process of right mandible, subsequent encounter for fracture with routine healing: Secondary | ICD-10-CM | POA: Diagnosis not present

## 2019-06-08 LAB — BASIC METABOLIC PANEL
BUN: 16 (ref 4–21)
CO2: 28 — AB (ref 13–22)
Chloride: 103 (ref 99–108)
Creatinine: 0.9 (ref 0.5–1.1)
Glucose: 96
Potassium: 4.2 (ref 3.4–5.3)
Sodium: 142 (ref 137–147)

## 2019-06-08 LAB — COMPREHENSIVE METABOLIC PANEL: Calcium: 9.8 (ref 8.7–10.7)

## 2019-06-08 NOTE — Progress Notes (Signed)
   Subjective:    Patient ID: [name], female    DOB: 03/21/1933, 86 y.o.   MRN: [id number]  Is an 17 female here for follow-up on her facial fracture and an assisted facility and states she is doing quite well.  She had a fall on January 25 and sustained facial trauma.  The CT showed a comminuted, impacted and anteriorly displaced fracture of the right mandibular condyle.  She has been on a liquid diet for the past 6 weeks.  She does not have any pain in her face.  She feels like her occlusion is normal.  Her dentures are in place.  She has lost a little bit of weight but is otherwise doing well.  Her left arm splint is in place.  She does not have any bruising.  I do not hear or feel any clicking with her opening and closing her mouth.     Review of Systems  Constitutional: Negative.   HENT: Negative.   Respiratory: Negative.  Negative for chest tightness.   Cardiovascular: Negative for leg swelling.  Gastrointestinal: Negative for abdominal distention.  Endocrine: Negative.   Genitourinary: Negative.   Hematological: Bruises/bleeds easily.  Psychiatric/Behavioral: Positive for confusion.       Objective:   Physical Exam Vitals and nursing note reviewed.  Constitutional:      Appearance: Normal appearance.  HENT:     Head: Normocephalic.  Cardiovascular:     Rate and Rhythm: Normal rate.     Pulses: Normal pulses.  Pulmonary:     Effort: Pulmonary effort is normal.  Neurological:     Mental Status: She is alert. Mental status is at baseline.  Psychiatric:        Mood and Affect: Mood normal.        Behavior: Behavior normal.        Assessment & Plan:     ICD-10-CM   1. Closed fracture of right condylar process of mandible with routine healing, subsequent encounter  S02.611D     Can advance to a soft diet for the next 2 weeks.  Then increase to regular diet slowly.  No biting into anything hard for the next two weeks.   Follow up as needed.  The [place] was signed into law in 2016 which includes the topic of electronic health records.  This provides immediate access to information in MyChart.  This includes consultation notes, operative notes, office notes, lab results and pathology reports.  If you have any questions about what you read please let us know at your next visit or call us at the office.  We are right here with you.

## 2019-06-10 ENCOUNTER — Encounter: Payer: Self-pay | Admitting: Nurse Practitioner

## 2019-06-10 ENCOUNTER — Non-Acute Institutional Stay (SKILLED_NURSING_FACILITY): Payer: Medicare PPO | Admitting: Nurse Practitioner

## 2019-06-10 DIAGNOSIS — R609 Edema, unspecified: Secondary | ICD-10-CM

## 2019-06-10 DIAGNOSIS — G2581 Restless legs syndrome: Secondary | ICD-10-CM

## 2019-06-10 DIAGNOSIS — J438 Other emphysema: Secondary | ICD-10-CM

## 2019-06-10 DIAGNOSIS — G40909 Epilepsy, unspecified, not intractable, without status epilepticus: Secondary | ICD-10-CM

## 2019-06-10 DIAGNOSIS — R413 Other amnesia: Secondary | ICD-10-CM

## 2019-06-10 DIAGNOSIS — K5901 Slow transit constipation: Secondary | ICD-10-CM | POA: Diagnosis not present

## 2019-06-10 DIAGNOSIS — M79661 Pain in right lower leg: Secondary | ICD-10-CM

## 2019-06-10 NOTE — Progress Notes (Signed)
Location:   [facility] Room Number: 33 Place of Service:  SNF (31) Provider:  Aveyah Greenwood NP  Leighton Ruff, MD  Patient Care Team: Leighton Ruff, MD as PCP - General (Family Medicine) [name], MD as Consulting Physician (Ophthalmology) [name], MD as Consulting Physician (Cardiology) [name], MD as Consulting Physician (Dermatology) [name], MD as Consulting Physician (Orthopedic Surgery) [name], MD as Consulting Physician (Orthopedic Surgery) [name], MD as Consulting Physician (Orthopedic Surgery) [name], [place], [place]na Viele X, NP as Nurse Practitioner (Nurse Practitioner) [name], MD as Consulting Physician (Neurology)  Extended Emergency Contact Information Primary Emergency Contact: [name],[name] Address: [address]          [place] [address] [place] Phone: [phone] Relation: Sister Secondary Emergency Contact: [name] [place] Phone: [phone] Relation: Sister  Code Status:  DNR Goals of care: Advanced Directive information Advanced Directives 06/10/2019  Does Patient Have a Medical Advance Directive? Yes  Type of Advance Directive Out of facility DNR (pink MOST or yellow form)  Does patient want to make changes to medical advance directive? No - Patient declined  Copy of [place] in Chart? -  Pre-existing out of facility DNR order (yellow form or pink MOST form) Yellow form placed in chart (order not valid for inpatient use);Pink MOST form placed in chart (order not valid for inpatient use)     Chief Complaint  Patient presents with  . Medical Management of Chronic Issues  . Health Maintenance    Mammogram    HPI:  Pt is a 86 y.o. female seen today for medical management of chronic diseases.    The patient resides in SNF [facility] for safety, care assistance, on Memantine 10mg  bid, for memory. Hx  of seizure, stable, on Keppra 250mg  am, 500mg  pm, Lamictal 150mg  bid, MiraPex 0.25mg  hs for RLS. Hx of COPD, stable, on Spiriva qd, Proair HFA q4h prn. Constipation, stable, on Senokot S bid, MiraLax. Qd. Chronic edema BLE, improved, on Furosemide 20mg  3x/day. OA general, on Tylenol qd.    Past Medical History:  Diagnosis Date  . Acute bronchitis 05/23/2011  . Acute upper respiratory infections of unspecified site 05/23/2011  . Arthritis   . Chronic airway obstruction, not elsewhere classified 05/23/2011  . Disturbance of salivary secretion 01/31/2011  . Dizziness and giddiness 01/31/2011  . Essential tremor 04/25/2014  . External hemorrhoids without mention of complication [id number]  . Gait disorder 04/25/2014  . Insomnia, unspecified 09/12/2011  . Lumbago 01/31/2011  . Major depressive disorder, single episode, unspecified 01/31/2011  . Memory disorder 04/25/2014  . Mitral valve disorders(424.0) 01/31/2011  . Other and unspecified hyperlipidemia 01/31/2011  . Other convulsions 01/31/2011  . Other emphysema ([place]) 01/31/2011  . Pain in joint, site unspecified 01/31/2011  . Restless legs syndrome (RLS) 09/12/2011  . Retinal detachment with retinal defect of right eye 2011   right eye twice  . Senile osteoporosis 01/31/2011  . Spontaneous ecchymoses 01/31/2011  . Stiffness of joints, not elsewhere classified, multiple sites 01/31/2011  . Unspecified essential hypertension 01/31/2011   Past Surgical History:  Procedure Laterality Date  . ABDOMINAL HYSTERECTOMY  06/21/2003   TAH/BSO, omenectomy PSB resect, Stg IC cystadenofibroma  . CHOLECYSTECTOMY  2005   Dr. Marlou Starks  . ELBOW SURGERY Right 2008   broken   Dr. Apolonio Schneiders  . EYE SURGERY    . RETINAL DETACHMENT SURGERY N/A    two  .  REVERSE SHOULDER ARTHROPLASTY Left 05/06/2019   Procedure: REVERSE SHOULDER ARTHROPLASTY;  Surgeon: Justice Britain, MD;  Location: [facility]S;  Service: Orthopedics;  Laterality: Left;  157min  . ROTATOR CUFF REPAIR Right  2012   Dr. Theda Sers  . SQUAMOUS CELL CARCINOMA EXCISION Bilateral 2012, 8/14   [place] on legs   Dr. Sarajane Jews  . TONSILLECTOMY  1941  . VIDEO BRONCHOSCOPY WITH ENDOBRONCHIAL NAVIGATION N/A 11/29/2015   Procedure: VIDEO BRONCHOSCOPY WITH ENDOBRONCHIAL NAVIGATION;  Surgeon: Collene Gobble, MD;  Location: [facility] OR;  Service: Thoracic;  Laterality: N/A;    Allergies  Allergen Reactions  . Latex Other (See Comments)    Swelling  . Sulfa Antibiotics Nausea And Vomiting  . Tetracyclines & Related     Allergies as of 06/10/2019      Reactions   Latex Other (See Comments)   Swelling   Sulfa Antibiotics Nausea And Vomiting   Tetracyclines & Related       Medication List       Accurate as of June 10, 2019 11:59 PM. If you have any questions, ask your nurse or doctor.        acetaminophen 325 MG tablet Commonly known as: TYLENOL Take 650 mg by mouth every 4 (four) hours as needed for mild pain.   acetaminophen 325 MG tablet Commonly known as: TYLENOL Take 650 mg by mouth daily.   alendronate 70 MG tablet Commonly known as: FOSAMAX Take 70 mg by mouth once a week. Take on Mondays   aspirin 81 MG tablet Take 81 mg by mouth daily.   cholecalciferol 1000 units tablet Commonly known as: VITAMIN D Take 1 tablet (1,000 Units total) by mouth daily.   furosemide 20 MG tablet Commonly known as: LASIX Take 20 mg by mouth every Monday, Wednesday, and Friday.   hydrocortisone 2.5 % cream Apply 1 application topically as needed (for psoriasis).   ketoconazole 2 % cream Commonly known as: NIZORAL Apply 1 application topically as needed (for psoriasis).   lamoTRIgine 150 MG tablet Commonly known as: LAMICTAL TAKE 1 TABLET BY MOUTH TWICE DAILY.   levETIRAcetam 500 MG tablet Commonly known as: KEPPRA Take 500 mg by mouth at bedtime.   levETIRAcetam 500 MG tablet Commonly known as: KEPPRA TAKE 1/2 TABLET IN THE AM AND 1 TABLET AT BEDTIME.   memantine 10 MG tablet Commonly known as:  NAMENDA TAKE 1 TABLET BY MOUTH TWICE DAILY.   polyethylene glycol 17 g packet Commonly known as: MIRALAX / GLYCOLAX Take 17 g by mouth at bedtime.   potassium chloride 10 MEQ tablet Commonly known as: KLOR-CON Take 10 mEq by mouth every Monday, Wednesday, and Friday.   pramipexole 0.25 MG tablet Commonly known as: MIRAPEX Take 0.25 mg by mouth at bedtime.   ProAir HFA 108 (90 Base) MCG/ACT inhaler Generic drug: albuterol 2 PUFFS INTO THE LUNGS EVERY 4 HOURS IF NEEDED FOR WHEEZING/SHORTNESSOF BREATH.   sennosides-docusate sodium 8.6-50 MG tablet Commonly known as: SENOKOT-S Take 1 tablet by mouth daily.   sennosides-docusate sodium 8.6-50 MG tablet Commonly known as: SENOKOT-S Take 1 tablet by mouth at bedtime.   simvastatin 10 MG tablet Commonly known as: ZOCOR Take 10 mg by mouth daily. Reported on 06/27/2015   tiotropium 18 MCG inhalation capsule Commonly known as: Spiriva HandiHaler INHALE CONTENTS OF ONE CAPSULE ONCE DAILY FOR COPD.       Review of Systems  Constitutional: Negative for activity change, appetite change, fatigue and fever.  HENT: Positive for hearing loss. Negative  for congestion and voice change.   Respiratory: Negative for cough and shortness of breath.        DOE  Cardiovascular: Positive for leg swelling. Negative for chest pain and palpitations.  Gastrointestinal: Negative for abdominal distention, abdominal pain, constipation, nausea and vomiting.  Genitourinary: Negative for difficulty urinating, dysuria and urgency.  Musculoskeletal: Positive for arthralgias, gait problem and joint swelling.       Right jaw pain when open mouth wide. Left shoulder swelling and pain with movement.   Skin: Positive for wound.  Neurological: Positive for seizures. Negative for dizziness, syncope, facial asymmetry, speech difficulty and weakness.       Memory lapses.   Psychiatric/Behavioral: Negative for agitation, behavioral problems and sleep disturbance.  The patient is not nervous/anxious.     Immunization History  Administered Date(s) Administered  . Influenza Split 01/06/2014  . Influenza Whole 01/07/2012, 01/06/2013  . Influenza, High Dose Seasonal PF 12/25/2015, 01/20/2017  . Influenza,inj,Quad PF,6+ Mos 12/21/2014  . Influenza-Unspecified 12/09/2018  . Moderna SARS-COVID-2 Vaccination 06/05/2019  . Pneumococcal Conjugate-13 02/01/2014  . Pneumococcal Polysaccharide-23 04/08/2004  . Td 04/08/2002  . Tdap 04/09/2011  . Zoster 04/08/2008  . Zoster Recombinat (Shingrix) 08/27/2017   Pertinent  Health Maintenance Due  Topic Date Due  . MAMMOGRAM  06/14/2015  . INFLUENZA VACCINE  Completed  . DEXA SCAN  Completed  . PNA vac Low Risk Adult  Completed   Fall Risk  03/24/2014 10/15/2012  Falls in the past year? No No   Functional Status Survey:    Vitals:   06/10/19 0947  BP: 130/70  Pulse: 88  Resp: 20  Temp: 97.6 F (36.4 C)  SpO2: 93%  Weight: 151 lb 11.2 oz (68.8 kg)  Height: 5\' 2"  (1.575 m)   Body mass index is 27.75 kg/m. Physical Exam Vitals and nursing note reviewed.  Constitutional:      General: She is not in acute distress.    Appearance: Normal appearance. She is not ill-appearing.  HENT:     Head: Normocephalic and atraumatic.     Mouth/Throat:     Mouth: Mucous membranes are moist.  Eyes:     Extraocular Movements: Extraocular movements intact.     Conjunctiva/sclera: Conjunctivae normal.     Pupils: Pupils are equal, round, and reactive to light.  Cardiovascular:     Rate and Rhythm: Normal rate and regular rhythm.     Heart sounds: Murmur present.  Pulmonary:     Breath sounds: No wheezing, rhonchi or rales.     Comments: Decreased air entry to both lungs.  Abdominal:     General: Bowel sounds are normal. There is no distension.     Palpations: Abdomen is soft.     Tenderness: There is no abdominal tenderness. There is no guarding or rebound.  Musculoskeletal:     Cervical back: Normal  range of motion and neck supple. No rigidity or tenderness.     Right lower leg: Edema present.     Left lower leg: Edema present.     Comments: Trace edema BLE.  Left shoulder pain with movement.    Skin:    General: Skin is warm and dry.     Comments: Left surgical wound is covered in dressing during my examination. Swelling noted, no warmth or heat in the area.   Neurological:     General: No focal deficit present.     Mental Status: She is alert and oriented to person, place, and time. Mental  status is at baseline.     Motor: No weakness.     Coordination: Coordination normal.     Gait: Gait abnormal.  Psychiatric:        Mood and Affect: Mood normal.        Behavior: Behavior normal.        Thought Content: Thought content normal.        Judgment: Judgment normal.     Labs reviewed: Recent Labs    05/08/19 0327 05/08/19 0327 05/09/19 [id number] 05/09/19 [id number] 05/10/19 0341 05/10/19 0341 05/12/19 0334 05/12/19 0334 05/13/19 0403 05/13/19 0403 05/20/19 0000 05/26/19 0000 06/08/19 0000  NA 138   < > 138   < > 139   < > 139   < > 138  --  140 142 142  K 3.6   < > 3.3*   < > 3.5   < > 3.3*   < > 3.6   < > 3.4 4.0 4.2  CL 103   < > 100   < > 104   < > 104   < > 105   < > 102 102 103  CO2 26   < > 30   < > 28   < > 25   < > 24   < > 30* 31* 28*  GLUCOSE 106*   < > 104*   < > 99  --  97  --  101*  --   --   --   --   BUN 17   < > 15   < > 13   < > 19   < > 16  --  14 16 16   CREATININE 0.48   < > 0.52   < > 0.55   < > 0.62   < > 0.60  --  0.7 0.9 0.9  CALCIUM 8.3*   < > 8.4*   < > 8.7*   < > 8.7*   < > 8.4*   < > 8.9 9.6 9.8  MG 1.8  --  1.8  --  2.0  --   --   --   --   --   --   --   --   PHOS 2.1*  --  3.3  --  3.0  --   --   --   --   --   --   --   --    < > = values in this interval not displayed.   Recent Labs    05/08/19 0327 05/08/19 0327 05/09/19 0338 05/10/19 0341 05/20/19 0000  AST 29   < > 26 25 16   ALT 23   < > 22 21 15   ALKPHOS 106   < > 97 91 254*    BILITOT 1.5*  --  1.1 1.4*  --   PROT 5.7*  --  5.6* 5.5*  --   ALBUMIN 3.1*   < > 2.9* 2.9* 3.5   < > = values in this interval not displayed.   Recent Labs    06/16/18 1056 05/04/19 0025 05/04/19 0449 05/11/19 0422 05/11/19 0422 05/12/19 0334 05/13/19 0403 05/20/19 0000  WBC 6.0 13.3*   < > 7.6   < > 7.4 8.3 5.9  NEUTROABS 3.7 11.3*  --   --   --   --   --  4,260  HGB 15.1 13.6   < > 10.8*   < > 10.8* 11.3* 12.1  HCT 44.8 41.6   < >  33.4*   < > 33.6* 36.2 36  MCV 89 93.7   < > 95.4  --  95.2 97.1  --   PLT 218 198   < > 220   < > 247 275 364   < > = values in this interval not displayed.   Lab Results  Component Value Date   TSH 1.51 02/28/2016   No results found for: HGBA1C Lab Results  Component Value Date   CHOL 249 (H) 06/27/2015   HDL 70 06/27/2015   LDLCALC 160 (H) 06/27/2015   TRIG 97 06/27/2015   CHOLHDL 3.6 06/27/2015    Significant Diagnostic Results in last 30 days:  No results found.  Assessment/Plan COPD (chronic obstructive pulmonary disease) (HCC) Stale, continue Spiriva qd, prn Proair HFA.   Slow transit constipation Stable, continue MIraLax, Senokot S  Seizure disorder (HCC) Stable, continue Lamictal, Keppra.   Restless legs syndrome (RLS) Stable, continue MiraPex.   Pain of right lower leg Continue Tylenol daily.   Memory disorder Functioning well in SNF [facility] presently, potential for AL level of care when she regains her physical strength further, continue Memantine for memory.   Edema Improved, continue Furosemide.      Family/ staff Communication: plan of care reviewed with the patient and charge nurse.   Labs/tests ordered:  none  Time spend 25 minutes.

## 2019-06-11 ENCOUNTER — Encounter: Payer: Self-pay | Admitting: Nurse Practitioner

## 2019-06-11 NOTE — Assessment & Plan Note (Signed)
Stable, continue Lamictal, Keppra.

## 2019-06-11 NOTE — Assessment & Plan Note (Signed)
Stable, continue MiraPex.  °

## 2019-06-11 NOTE — Assessment & Plan Note (Signed)
Stable, continue MIraLax, Senokot S 

## 2019-06-11 NOTE — Assessment & Plan Note (Signed)
Improved, continue Furosemide.

## 2019-06-11 NOTE — Assessment & Plan Note (Signed)
Continue Tylenol daily.

## 2019-06-11 NOTE — Assessment & Plan Note (Signed)
Stale, continue Spiriva qd, prn Proair HFA.

## 2019-06-11 NOTE — Assessment & Plan Note (Signed)
Functioning well in SNF [facility] presently, potential for AL level of care when she regains her physical strength further, continue Memantine for memory.

## 2019-06-16 NOTE — Progress Notes (Deleted)
PATIENT: [name] DOB: 05-16-1932  REASON FOR VISIT: follow up HISTORY FROM: patient  HISTORY OF PRESENT ILLNESS: Today 06/16/19  [name] is an 86 year old female with history of seizures, and mild memory disturbance.  She is on Keppra and Lamictal.  She is on Namenda. She had a fall in January, requiring hospital admission, had a left humerus fracture and mandibular fracture.  She has now in SNF at [facility], could potentially go to assisted living.  HISTORY 06/16/2018 SS: [name] is a 86 year old female who presents for follow-up with history of seizures, mild memory disturbance.  She is currently taking Keppra and Lamictal for seizures.  She is currently taking Namenda and her last MMSE was 30/30.  She reports overall she is doing quite well.  She reports her last seizure was years ago.  She is tolerating her medications without problem.  She lives at [facility] independent living.  She does not drive a car.  She does some light housework, light cooking but eats most meals in the dining hall.  She does manage her finances.  She is usually very active, walking 4 miles a day with a walker.  She recently had minor surgery to her right leg and is recovering from that.  She denies any falls but reports her balance has been off.  She reports she is very careful not to fall as she was told her bone density was low.  She reports her memory may be a little worse however she attributes this to being 58.  She is generally a very active lady, does activities, spends time with friends.  She does report restless legs at night and she will take Mirapex at bedtime with good benefit.  She denies any new problems or concerns today and reports overall she is doing quite well.  REVIEW OF SYSTEMS: Out of a complete 14 system review of symptoms, the patient complains only of the following symptoms, and all other reviewed systems are negative.  ALLERGIES: Allergies  Allergen Reactions  . Latex  Other (See Comments)    Swelling  . Sulfa Antibiotics Nausea And Vomiting  . Tetracyclines & Related     HOME MEDICATIONS: Outpatient Medications Prior to Visit  Medication Sig Dispense Refill  . acetaminophen (TYLENOL) 325 MG tablet Take 650 mg by mouth every 4 (four) hours as needed for mild pain.    [name] acetaminophen (TYLENOL) 325 MG tablet Take 650 mg by mouth daily.    [name] alendronate (FOSAMAX) 70 MG tablet Take 70 mg by mouth once a week. Take on Mondays    . aspirin 81 MG tablet Take 81 mg by mouth daily.    . cholecalciferol (VITAMIN D) 1000 units tablet Take 1 tablet (1,000 Units total) by mouth daily.    . furosemide (LASIX) 20 MG tablet Take 20 mg by mouth every Monday, Wednesday, and Friday.    . hydrocortisone 2.5 % cream Apply 1 application topically as needed (for psoriasis).     [name] ketoconazole (NIZORAL) 2 % cream Apply 1 application topically as needed (for psoriasis).     [name] lamoTRIgine (LAMICTAL) 150 MG tablet TAKE 1 TABLET BY MOUTH TWICE DAILY. 180 tablet 3  . levETIRAcetam (KEPPRA) 500 MG tablet TAKE 1/2 TABLET IN THE AM AND 1 TABLET AT BEDTIME. 135 tablet 3  . levETIRAcetam (KEPPRA) 500 MG tablet Take 500 mg by mouth at bedtime.    . memantine (NAMENDA) 10 MG tablet TAKE 1 TABLET BY MOUTH TWICE  DAILY. 180 tablet 1  . polyethylene glycol (MIRALAX / GLYCOLAX) 17 g packet Take 17 g by mouth at bedtime.    . potassium chloride (KLOR-CON) 10 MEQ tablet Take 10 mEq by mouth every Monday, Wednesday, and Friday.    . pramipexole (MIRAPEX) 0.25 MG tablet Take 0.25 mg by mouth at bedtime.    [name] PROAIR HFA 108 (90 Base) MCG/ACT inhaler 2 PUFFS INTO THE LUNGS EVERY 4 HOURS IF NEEDED FOR WHEEZING/SHORTNESSOF BREATH. 8.5 g 0  . sennosides-docusate sodium (SENOKOT-S) 8.6-50 MG tablet Take 1 tablet by mouth daily.    . sennosides-docusate sodium (SENOKOT-S) 8.6-50 MG tablet Take 1 tablet by mouth at bedtime.    . simvastatin (ZOCOR) 10 MG tablet Take 10 mg by mouth daily. Reported on  06/27/2015    . tiotropium (SPIRIVA HANDIHALER) 18 MCG inhalation capsule INHALE CONTENTS OF ONE CAPSULE ONCE DAILY FOR COPD. 30 capsule 5   No facility-administered medications prior to visit.    PAST MEDICAL HISTORY: Past Medical History:  Diagnosis Date  . Acute bronchitis 05/23/2011  . Acute upper respiratory infections of unspecified site 05/23/2011  . Arthritis   . Chronic airway obstruction, not elsewhere classified 05/23/2011  . Disturbance of salivary secretion 01/31/2011  . Dizziness and giddiness 01/31/2011  . Essential tremor 04/25/2014  . External hemorrhoids without mention of complication A999333  . Gait disorder 04/25/2014  . Insomnia, unspecified 09/12/2011  . Lumbago 01/31/2011  . Major depressive disorder, single episode, unspecified 01/31/2011  . Memory disorder 04/25/2014  . Mitral valve disorders(424.0) 01/31/2011  . Other and unspecified hyperlipidemia 01/31/2011  . Other convulsions 01/31/2011  . Other emphysema ([place]) 01/31/2011  . Pain in joint, site unspecified 01/31/2011  . Restless legs syndrome (RLS) 09/12/2011  . Retinal detachment with retinal defect of right eye 2011   right eye twice  . Senile osteoporosis 01/31/2011  . Spontaneous ecchymoses 01/31/2011  . Stiffness of joints, not elsewhere classified, multiple sites 01/31/2011  . Unspecified essential hypertension 01/31/2011    PAST SURGICAL HISTORY: Past Surgical History:  Procedure Laterality Date  . ABDOMINAL HYSTERECTOMY  06/21/2003   TAH/BSO, omenectomy PSB resect, Stg IC cystadenofibroma  . CHOLECYSTECTOMY  2005   Dr. Marlou Starks  . ELBOW SURGERY Right 2008   broken   Dr. Apolonio Schneiders  . EYE SURGERY    . RETINAL DETACHMENT SURGERY N/A    two  . REVERSE SHOULDER ARTHROPLASTY Left 05/06/2019   Procedure: REVERSE SHOULDER ARTHROPLASTY;  Surgeon: Justice Britain, MD;  Location: [facility];  Service: Orthopedics;  Laterality: Left;  148min  . ROTATOR CUFF REPAIR Right 2012   Dr. Theda Sers  . SQUAMOUS CELL  CARCINOMA EXCISION Bilateral 2012, 8/14   [place] on legs   Dr. Sarajane Jews  . TONSILLECTOMY  1941  . VIDEO BRONCHOSCOPY WITH ENDOBRONCHIAL NAVIGATION N/A 11/29/2015   Procedure: VIDEO BRONCHOSCOPY WITH ENDOBRONCHIAL NAVIGATION;  Surgeon: Collene Gobble, MD;  Location: [facility] OR;  Service: Thoracic;  Laterality: N/A;    FAMILY HISTORY: Family History  Problem Relation Age of Onset  . Heart disease Father        CHF  . Cancer Mother        breast  . Seizures Sister     SOCIAL HISTORY: Social History   Socioeconomic History  . Marital status: Divorced    Spouse name: Not on file  . Number of children: 0  . Years of education: Not on file  . Highest education level: Not on file  Occupational History  .  Occupation: retired Tourist information centre manager for [employer]  Tobacco Use  . Smoking status: Former Smoker    Packs/day: 1.00    Years: 40.00    Pack years: 40.00    Types: Cigarettes    Quit date: 04/08/1988    Years since quitting: 31.2  . Smokeless tobacco: Never Used  . Tobacco comment: quit in 1990  Substance and Sexual Activity  . Alcohol use: No    Alcohol/week: 0.0 standard drinks  . Drug use: No  . Sexual activity: Never  Other Topics Concern  . Not on file  Social History Narrative   Lives at [facility]   No children   Divorced   Exercise climbs steps 4 flights daily PT for [employer]   Alcohol none   Stopped smoking 1989   Patient drinks 3 large sodas daily.   Patient is right handed.   Social Determinants of Health   Financial Resource Strain:   . Difficulty of Paying Living Expenses: Not on file  Food Insecurity:   . Worried About Charity fundraiser in the Last Year: Not on file  . Ran Out of Food in the Last Year: Not on file  Transportation Needs:   . Lack of Transportation (Medical): Not on file  . Lack of Transportation (Non-Medical): Not on file  Physical Activity:   . Days of Exercise per Week: Not on file  . Minutes of Exercise per Session: Not on  file  Stress:   . Feeling of Stress : Not on file  Social Connections:   . Frequency of Communication with Friends and Family: Not on file  . Frequency of Social Gatherings with Friends and Family: Not on file  . Attends Religious Services: Not on file  . Active Member of Clubs or Organizations: Not on file  . Attends Archivist Meetings: Not on file  . Marital Status: Not on file  Intimate Partner Violence:   . Fear of Current or Ex-Partner: Not on file  . Emotionally Abused: Not on file  . Physically Abused: Not on file  . Sexually Abused: Not on file      PHYSICAL EXAM  There were no vitals filed for this visit. There is no height or weight on file to calculate BMI.  Generalized: Well developed, in no acute distress   Neurological examination  Mentation: Alert oriented to time, place, history taking. Follows all commands speech and language fluent Cranial nerve II-XII: Pupils were equal round reactive to light. Extraocular movements were full, visual field were full on confrontational test. Facial sensation and strength were normal. Uvula tongue midline. Head turning and shoulder shrug  were normal and symmetric. Motor: The motor testing reveals 5 over 5 strength of all 4 extremities. Good symmetric motor tone is noted throughout.  Sensory: Sensory testing is intact to soft touch on all 4 extremities. No evidence of extinction is noted.  Coordination: Cerebellar testing reveals good finger-nose-finger and heel-to-shin bilaterally.  Gait and station: Gait is normal. Tandem gait is normal. Romberg is negative. No drift is seen.  Reflexes: Deep tendon reflexes are symmetric and normal bilaterally.   DIAGNOSTIC DATA (LABS, IMAGING, TESTING) - I reviewed patient records, labs, notes, testing and imaging myself where available.  Lab Results  Component Value Date   WBC 5.9 05/20/2019   HGB 12.1 05/20/2019   HCT 36 05/20/2019   MCV 97.1 05/13/2019   PLT 364 05/20/2019       Component Value Date/Time   NA  142 06/08/2019 0000   NA 143 06/22/2014 1034   K 4.2 06/08/2019 0000   K 5.0 06/22/2014 1034   CL 103 06/08/2019 0000   CO2 28 (A) 06/08/2019 0000   CO2 28 06/22/2014 1034   GLUCOSE 101 (H) 05/13/2019 0403   GLUCOSE 82 06/22/2014 1034   BUN 16 06/08/2019 0000   BUN 29.6 (H) 06/22/2014 1034   CREATININE 0.9 06/08/2019 0000   CREATININE 0.60 05/13/2019 0403   CREATININE 1.02 (H) 06/27/2015 1055   CREATININE 0.8 06/22/2014 1034   CALCIUM 9.8 06/08/2019 0000   CALCIUM 9.7 06/22/2014 1034   PROT 5.5 (L) 05/10/2019 0341   PROT 6.8 06/16/2018 1056   ALBUMIN 3.5 05/20/2019 0000   ALBUMIN 4.5 06/16/2018 1056   AST 16 05/20/2019 0000   ALT 15 05/20/2019 0000   ALKPHOS 254 (A) 05/20/2019 0000   BILITOT 1.4 (H) 05/10/2019 0341   BILITOT 0.4 06/16/2018 1056   GFRNONAA 58 05/26/2019 0000   GFRAA 67 05/26/2019 0000   Lab Results  Component Value Date   CHOL 249 (H) 06/27/2015   HDL 70 06/27/2015   LDLCALC 160 (H) 06/27/2015   TRIG 97 06/27/2015   CHOLHDL 3.6 06/27/2015   No results found for: HGBA1C Lab Results  Component Value Date   VITAMINB12 425 04/25/2014   Lab Results  Component Value Date   TSH 1.51 02/28/2016      ASSESSMENT AND PLAN 86 y.o. year old female  has a past medical history of Acute bronchitis (05/23/2011), Acute upper respiratory infections of unspecified site (05/23/2011), Arthritis, Chronic airway obstruction, not elsewhere classified (05/23/2011), Disturbance of salivary secretion (01/31/2011), Dizziness and giddiness (01/31/2011), Essential tremor (04/25/2014), External hemorrhoids without mention of complication (A999333), Gait disorder (04/25/2014), Insomnia, unspecified (09/12/2011), Lumbago (01/31/2011), Major depressive disorder, single episode, unspecified (01/31/2011), Memory disorder (04/25/2014), Mitral valve disorders(424.0) (01/31/2011), Other and unspecified hyperlipidemia (01/31/2011), Other convulsions  (01/31/2011), Other emphysema ([place]) (01/31/2011), Pain in joint, site unspecified (01/31/2011), Restless legs syndrome (RLS) (09/12/2011), Retinal detachment with retinal defect of right eye (2011), Senile osteoporosis (01/31/2011), Spontaneous ecchymoses (01/31/2011), Stiffness of joints, not elsewhere classified, multiple sites (01/31/2011), and Unspecified essential hypertension (01/31/2011). here with ***   I spent 15 minutes with the patient. 50% of this time was spent   Butler Denmark, [place], DNP 06/16/2019, 3:57 PM [facility] [address] [place],  [address] [phone]

## 2019-06-17 ENCOUNTER — Telehealth: Payer: Self-pay

## 2019-06-17 ENCOUNTER — Ambulatory Visit: Payer: Medicare Other | Admitting: Neurology

## 2019-06-17 NOTE — Telephone Encounter (Signed)
Patient was a no call/no show for their appointment today.   

## 2019-06-21 ENCOUNTER — Non-Acute Institutional Stay (SKILLED_NURSING_FACILITY): Payer: Medicare PPO | Admitting: Nurse Practitioner

## 2019-06-21 ENCOUNTER — Encounter: Payer: Self-pay | Admitting: Nurse Practitioner

## 2019-06-21 ENCOUNTER — Encounter: Payer: Self-pay | Admitting: Neurology

## 2019-06-21 DIAGNOSIS — R609 Edema, unspecified: Secondary | ICD-10-CM

## 2019-06-21 DIAGNOSIS — I739 Peripheral vascular disease, unspecified: Secondary | ICD-10-CM | POA: Diagnosis not present

## 2019-06-21 DIAGNOSIS — L089 Local infection of the skin and subcutaneous tissue, unspecified: Secondary | ICD-10-CM | POA: Diagnosis not present

## 2019-06-21 DIAGNOSIS — R413 Other amnesia: Secondary | ICD-10-CM

## 2019-06-21 NOTE — Assessment & Plan Note (Signed)
06/19/19 reported 4 pustules found on the right buttock, applied ABT ointment.

## 2019-06-21 NOTE — Progress Notes (Signed)
Location:   [facility] Room Number: 48 Place of Service:  SNF (31) Provider:  Angeleigh Chiasson, NP  Leighton Ruff, MD  Patient Care Team: Leighton Ruff, MD as PCP - General (Family Medicine) [name], MD as Consulting Physician (Ophthalmology) [name], MD as Consulting Physician (Cardiology) [name], MD as Consulting Physician (Dermatology) [name], MD as Consulting Physician (Orthopedic Surgery) [name], MD as Consulting Physician (Orthopedic Surgery) [name], MD as Consulting Physician (Orthopedic Surgery) [name], [place], Shana Younge X, NP as Nurse Practitioner (Nurse Practitioner) [name], MD as Consulting Physician (Neurology)  Extended Emergency Contact Information Primary Emergency Contact: [name],[name] Address: [address]          [place] [address] [place] Phone: [phone] Relation: Sister Secondary Emergency Contact: [name] [place] Phone: [phone] Relation: Sister  Code Status:  DNR Goals of care: Advanced Directive information Advanced Directives 06/21/2019  Does Patient Have a Medical Advance Directive? Yes  Type of Advance Directive Out of facility DNR (pink MOST or yellow form)  Does patient want to make changes to medical advance directive? No - Patient declined  Copy of [place] in Chart? -  Pre-existing out of facility DNR order (yellow form or pink MOST form) Yellow form placed in chart (order not valid for inpatient use);Pink MOST form placed in chart (order not valid for inpatient use)     Chief Complaint  Patient presents with  . Acute Visit    Left Buttock Skin Lesion    HPI:  Pt is a 86 y.o. female seen today for an acute visit for Pustules determined by examination 06/19/19 reported 4 pustules found on the right buttock, applied ABT ointment.   The patient c/o feeling cold and  skin discoloration of BLE, denied pain, no noted wounds, onset is gradual, duration is long term.   BLE edema, better, on Furosemide 20mg  3x/wk. Her memory is preserved on Memantine.   Past Medical History:  Diagnosis Date  . Acute bronchitis 05/23/2011  . Acute upper respiratory infections of unspecified site 05/23/2011  . Arthritis   . Chronic airway obstruction, not elsewhere classified 05/23/2011  . Disturbance of salivary secretion 01/31/2011  . Dizziness and giddiness 01/31/2011  . Essential tremor 04/25/2014  . External hemorrhoids without mention of complication [id number]  . Gait disorder 04/25/2014  . Insomnia, unspecified 09/12/2011  . Lumbago 01/31/2011  . Major depressive disorder, single episode, unspecified 01/31/2011  . Memory disorder 04/25/2014  . Mitral valve disorders(424.0) 01/31/2011  . Other and unspecified hyperlipidemia 01/31/2011  . Other convulsions 01/31/2011  . Other emphysema ([place]) 01/31/2011  . Pain in joint, site unspecified 01/31/2011  . Restless legs syndrome (RLS) 09/12/2011  . Retinal detachment with retinal defect of right eye 2011   right eye twice  . Senile osteoporosis 01/31/2011  . Spontaneous ecchymoses 01/31/2011  . Stiffness of joints, not elsewhere classified, multiple sites 01/31/2011  . Unspecified essential hypertension 01/31/2011   Past Surgical History:  Procedure Laterality Date  . ABDOMINAL HYSTERECTOMY  06/21/2003   TAH/BSO, omenectomy PSB resect, Stg IC cystadenofibroma  . CHOLECYSTECTOMY  2005   Dr. Marlou Starks  . ELBOW SURGERY Right 2008   broken   Dr. Apolonio Schneiders  . EYE SURGERY    . RETINAL DETACHMENT SURGERY N/A    two  . REVERSE SHOULDER ARTHROPLASTY Left 05/06/2019   Procedure: REVERSE SHOULDER ARTHROPLASTY;  Surgeon: Justice Britain, MD;  Location: [facility];  Service: Orthopedics;  Laterality: Left;  111min  . ROTATOR CUFF REPAIR Right 2012   Dr. Theda Sers  . SQUAMOUS CELL CARCINOMA EXCISION Bilateral 2012, 8/14   [place] on legs   Dr.  Sarajane Jews  . TONSILLECTOMY  1941  . VIDEO BRONCHOSCOPY WITH ENDOBRONCHIAL NAVIGATION N/A 11/29/2015   Procedure: VIDEO BRONCHOSCOPY WITH ENDOBRONCHIAL NAVIGATION;  Surgeon: Collene Gobble, MD;  Location: [facility] OR;  Service: Thoracic;  Laterality: N/A;    Allergies  Allergen Reactions  . Latex Other (See Comments)    Swelling  . Sulfa Antibiotics Nausea And Vomiting  . Tetracyclines & Related     Allergies as of 06/21/2019      Reactions   Latex Other (See Comments)   Swelling   Sulfa Antibiotics Nausea And Vomiting   Tetracyclines & Related       Medication List       Accurate as of June 21, 2019 11:59 PM. If you have any questions, ask your nurse or doctor.        acetaminophen 325 MG tablet Commonly known as: TYLENOL Take 650 mg by mouth 2 (two) times daily as needed for mild pain. What changed: Another medication with the same name was removed. Continue taking this medication, and follow the directions you see here. Changed by: Sedrick Tober X Lashika Erker, NP   acetaminophen 325 MG tablet Commonly known as: TYLENOL Take 650 mg by mouth daily. At 4pm. What changed: Another medication with the same name was removed. Continue taking this medication, and follow the directions you see here. Changed by: Aaryanna Hyden X Oluwatoyin Banales, NP   alendronate 70 MG tablet Commonly known as: FOSAMAX Take 70 mg by mouth once a week. Take on Mondays   aspirin 81 MG tablet Take 81 mg by mouth daily.   cholecalciferol 1000 units tablet Commonly known as: VITAMIN D Take 1 tablet (1,000 Units total) by mouth daily.   furosemide 20 MG tablet Commonly known as: LASIX Take 20 mg by mouth every Monday, Wednesday, and Friday.   hydrocortisone 2.5 % cream Apply 1 application topically as needed (for psoriasis).   ketoconazole 2 % cream Commonly known as: NIZORAL Apply 1 application topically as needed (for psoriasis).   lamoTRIgine 150 MG tablet Commonly known as: LAMICTAL TAKE 1 TABLET BY MOUTH TWICE DAILY.     levETIRAcetam 500 MG tablet Commonly known as: KEPPRA Take 500 mg by mouth at bedtime.   levETIRAcetam 500 MG tablet Commonly known as: KEPPRA TAKE 1/2 TABLET IN THE AM AND 1 TABLET AT BEDTIME.   memantine 10 MG tablet Commonly known as: NAMENDA TAKE 1 TABLET BY MOUTH TWICE DAILY.   polyethylene glycol 17 g packet Commonly known as: MIRALAX / GLYCOLAX Take 17 g by mouth at bedtime.   potassium chloride 10 MEQ tablet Commonly known as: KLOR-CON Take 10 mEq by mouth daily.   pramipexole 0.25 MG tablet Commonly known as: MIRAPEX Take 0.25 mg by mouth at bedtime.   ProAir HFA 108 (90 Base) MCG/ACT inhaler Generic drug: albuterol 2 PUFFS INTO THE LUNGS EVERY 4 HOURS IF NEEDED FOR WHEEZING/SHORTNESSOF BREATH.   sennosides-docusate sodium 8.6-50 MG tablet Commonly known as: SENOKOT-S Take 1 tablet by mouth daily.   sennosides-docusate sodium 8.6-50 MG tablet Commonly known as: SENOKOT-S Take 1 tablet by mouth at bedtime.   simvastatin 10 MG tablet Commonly known as: ZOCOR Take 10 mg by mouth daily. Reported on 06/27/2015   tiotropium 18 MCG inhalation capsule Commonly known as: Spiriva HandiHaler  INHALE CONTENTS OF ONE CAPSULE ONCE DAILY FOR COPD.     ROS was provided with assistance of staff.   Review of Systems  Constitutional: Negative for activity change, appetite change and fever.  HENT: Positive for hearing loss. Negative for congestion and voice change.   Respiratory: Negative for cough and shortness of breath.        DOE  Cardiovascular: Positive for leg swelling. Negative for chest pain and palpitations.  Gastrointestinal: Negative for abdominal distention, abdominal pain, constipation, nausea and vomiting.  Genitourinary: Negative for difficulty urinating, dysuria and urgency.  Musculoskeletal: Positive for arthralgias, gait problem and joint swelling.       Left shoulder swelling and pain with movement-improved.   Skin: Positive for color change.        06/19/19 reported 4 pustules found on the right buttock, applied ABT ointment.  BLE discoloration and cold to touch.   Neurological: Positive for seizures. Negative for dizziness, syncope and facial asymmetry.       Memory lapses.   Psychiatric/Behavioral: Negative for agitation, behavioral problems and sleep disturbance. The patient is not nervous/anxious.     Immunization History  Administered Date(s) Administered  . Influenza Split 01/06/2014  . Influenza Whole 01/07/2012, 01/06/2013  . Influenza, High Dose Seasonal PF 12/25/2015, 01/20/2017  . Influenza,inj,Quad PF,6+ Mos 12/21/2014  . Influenza-Unspecified 12/09/2018  . Moderna SARS-COVID-2 Vaccination 06/05/2019  . Pneumococcal Conjugate-13 02/01/2014  . Pneumococcal Polysaccharide-23 04/08/2004  . Td 04/08/2002  . Tdap 04/09/2011  . Zoster 04/08/2008  . Zoster Recombinat (Shingrix) 08/27/2017   Pertinent  Health Maintenance Due  Topic Date Due  . MAMMOGRAM  06/14/2015  . INFLUENZA VACCINE  Completed  . DEXA SCAN  Completed  . PNA vac Low Risk Adult  Completed   Fall Risk  03/24/2014 10/15/2012  Falls in the past year? No No   Functional Status Survey:    Vitals:   06/21/19 0846  BP: (!) 142/86  Pulse: 80  Resp: 20  Temp: (!) 97.1 F (36.2 C)  SpO2: 90%  Weight: 151 lb 11.2 oz (68.8 kg)  Height: 5\' 2"  (1.575 m)   Body mass index is 27.75 kg/m. Physical Exam Vitals and nursing note reviewed.  Constitutional:      General: She is not in acute distress.    Appearance: Normal appearance. She is not ill-appearing.  HENT:     Head: Normocephalic and atraumatic.     Mouth/Throat:     Mouth: Mucous membranes are moist.  Eyes:     Extraocular Movements: Extraocular movements intact.     Pupils: Pupils are equal, round, and reactive to light.  Cardiovascular:     Rate and Rhythm: Normal rate and regular rhythm.     Heart sounds: Murmur present.     Comments: PD pulses are not felt.  Pulmonary:     Breath  sounds: No wheezing or rales.     Comments: Decreased air entry to both lungs.  Abdominal:     General: Bowel sounds are normal. There is no distension.     Palpations: Abdomen is soft.     Tenderness: There is no abdominal tenderness. There is no guarding or rebound.  Musculoskeletal:     Cervical back: Normal range of motion and neck supple.     Right lower leg: Edema present.     Left lower leg: Edema present.     Comments: Trace edema BLE.  Left shoulder pain with movement. Mild swelling and redness of the left  shoulder, in sling.    Skin:    General: Skin is warm and dry.     Comments: 06/19/19 reported 4 pustules found on the right buttock, applied ABT ointment.  Brownish skin discoloration BLE, cold to touch L>R, not felt DP pulses.   Neurological:     General: No focal deficit present.     Mental Status: She is alert and oriented to person, place, and time. Mental status is at baseline.     Motor: No weakness.     Coordination: Coordination normal.     Gait: Gait abnormal.  Psychiatric:        Mood and Affect: Mood normal.        Behavior: Behavior normal.        Thought Content: Thought content normal.        Judgment: Judgment normal.     Labs reviewed: Recent Labs    05/08/19 0327 05/08/19 0327 05/09/19 [id number] 05/09/19 [id number] 05/10/19 0341 05/10/19 0341 05/12/19 0334 05/12/19 0334 05/13/19 0403 05/13/19 0403 05/20/19 0000 05/26/19 0000 06/08/19 0000  NA 138   < > 138   < > 139   < > 139   < > 138  --  140 142 142  K 3.6   < > 3.3*   < > 3.5   < > 3.3*   < > 3.6   < > 3.4 4.0 4.2  CL 103   < > 100   < > 104   < > 104   < > 105   < > 102 102 103  CO2 26   < > 30   < > 28   < > 25   < > 24   < > 30* 31* 28*  GLUCOSE 106*   < > 104*   < > 99  --  97  --  101*  --   --   --   --   BUN 17   < > 15   < > 13   < > 19   < > 16  --  14 16 16   CREATININE 0.48   < > 0.52   < > 0.55   < > 0.62   < > 0.60  --  0.7 0.9 0.9  CALCIUM 8.3*   < > 8.4*   < > 8.7*   < > 8.7*   <  > 8.4*   < > 8.9 9.6 9.8  MG 1.8  --  1.8  --  2.0  --   --   --   --   --   --   --   --   PHOS 2.1*  --  3.3  --  3.0  --   --   --   --   --   --   --   --    < > = values in this interval not displayed.   Recent Labs    05/08/19 0327 05/08/19 0327 05/09/19 0338 05/10/19 0341 05/20/19 0000  AST 29   < > 26 25 16   ALT 23   < > 22 21 15   ALKPHOS 106   < > 97 91 254*  BILITOT 1.5*  --  1.1 1.4*  --   PROT 5.7*  --  5.6* 5.5*  --   ALBUMIN 3.1*   < > 2.9* 2.9* 3.5   < > = values in this interval not displayed.   Recent Labs  05/04/19 0025 05/04/19 0449 05/11/19 0422 05/11/19 0422 05/12/19 0334 05/13/19 0403 05/20/19 0000  WBC 13.3*   < > 7.6   < > 7.4 8.3 5.9  NEUTROABS 11.3*  --   --   --   --   --  4,260  HGB 13.6   < > 10.8*   < > 10.8* 11.3* 12.1  HCT 41.6   < > 33.4*   < > 33.6* 36.2 36  MCV 93.7   < > 95.4  --  95.2 97.1  --   PLT 198   < > 220   < > 247 275 364   < > = values in this interval not displayed.   Lab Results  Component Value Date   TSH 1.51 02/28/2016   No results found for: HGBA1C Lab Results  Component Value Date   CHOL 249 (H) 06/27/2015   HDL 70 06/27/2015   LDLCALC 160 (H) 06/27/2015   TRIG 97 06/27/2015   CHOLHDL 3.6 06/27/2015    Significant Diagnostic Results in last 30 days:  No results found.  Assessment/Plan Pustules determined by examination 06/19/19 reported 4 pustules found on the right buttock, applied ABT ointment.   PAD (peripheral artery disease) (HCC) Brownish skin discoloration BLE, cold to touch L>R, not felt DP pulses.  ABI R+L to evaluate further.   Edema Better, continue Furosemide.   Memory disorder Memory is preserved, continue Memantine. The patient is high functional.      Family/ staff Communication: plan of care reviewed with the patient and charge nurse.   Labs/tests ordered:  ABI  Time spend 25 minutes.

## 2019-06-23 ENCOUNTER — Encounter: Payer: Self-pay | Admitting: Nurse Practitioner

## 2019-06-23 DIAGNOSIS — I739 Peripheral vascular disease, unspecified: Secondary | ICD-10-CM | POA: Insufficient documentation

## 2019-06-23 NOTE — Assessment & Plan Note (Signed)
Memory is preserved, continue Memantine. The patient is high functional.

## 2019-06-23 NOTE — Assessment & Plan Note (Signed)
Better, continue Furosemide.

## 2019-06-23 NOTE — Assessment & Plan Note (Signed)
Brownish skin discoloration BLE, cold to touch L>R, not felt DP pulses.  ABI R+L to evaluate further.

## 2019-06-25 ENCOUNTER — Encounter: Payer: Self-pay | Admitting: Internal Medicine

## 2019-06-25 ENCOUNTER — Non-Acute Institutional Stay (SKILLED_NURSING_FACILITY): Payer: Medicare PPO | Admitting: Internal Medicine

## 2019-06-25 DIAGNOSIS — R42 Dizziness and giddiness: Secondary | ICD-10-CM

## 2019-06-25 DIAGNOSIS — I10 Essential (primary) hypertension: Secondary | ICD-10-CM

## 2019-06-25 DIAGNOSIS — S42202S Unspecified fracture of upper end of left humerus, sequela: Secondary | ICD-10-CM | POA: Diagnosis not present

## 2019-06-25 DIAGNOSIS — M810 Age-related osteoporosis without current pathological fracture: Secondary | ICD-10-CM

## 2019-06-25 DIAGNOSIS — G2581 Restless legs syndrome: Secondary | ICD-10-CM

## 2019-06-25 DIAGNOSIS — S02611D Fracture of condylar process of right mandible, subsequent encounter for fracture with routine healing: Secondary | ICD-10-CM

## 2019-06-25 DIAGNOSIS — G40909 Epilepsy, unspecified, not intractable, without status epilepticus: Secondary | ICD-10-CM

## 2019-06-25 DIAGNOSIS — R339 Retention of urine, unspecified: Secondary | ICD-10-CM

## 2019-06-25 NOTE — Progress Notes (Signed)
Location:   [facility] Room Number: 33 Place of Service:  SNF [phone])  Provider: Veleta Miners MD  PCP: Leighton Ruff, MD Patient Care Team: Leighton Ruff, MD as PCP - General (Family Medicine) [name], MD as Consulting Physician (Ophthalmology) [name], MD as Consulting Physician (Cardiology) [name], MD as Consulting Physician (Dermatology) [name], MD as Consulting Physician (Orthopedic Surgery) [name], MD as Consulting Physician (Orthopedic Surgery) [name], MD as Consulting Physician (Orthopedic Surgery) Clayton, [place], Man [place], NP as Nurse Practitioner (Nurse Practitioner) [name], MD as Consulting Physician (Neurology)  Extended Emergency Contact Information Primary Emergency Contact: [name],[name] Address: [address]           [address] [place] Phone: [phone] Relation: Sister Secondary Emergency Contact: [name] [place] Phone: [phone] Relation: Sister  Code Status: DNR Goals of care:  Advanced Directive information Advanced Directives 06/25/2019  Does Patient Have a Medical Advance Directive? Yes  Type of Advance Directive Out of facility DNR (pink MOST or yellow form)  Does patient want to make changes to medical advance directive? No - Patient declined  Copy of Roby in Chart? -  Pre-existing out of facility DNR order (yellow form or pink MOST form) Yellow form placed in chart (order not valid for inpatient use);Pink MOST form placed in chart (order not valid for inpatient use)     Allergies  Allergen Reactions  . Latex Other (See Comments)    Swelling  . Sulfa Antibiotics Nausea And Vomiting  . Tetracyclines & Related     Chief Complaint  Patient presents with  . Discharge Note    Discharge    HPI:  86 y.o. female seen today for discharge from SNF to  [place]   Patient was in SNF for Therapy after she fell and sustainedLeft Humerus Fracture with Displacement needing Arthroplasty , Right Mandibular Fracture, Pneumonia and Urinary retention. Stayed in the Hospital from 1/25-2/4  Patientalsohas h/o COPD, Hypertension,Seizure Disorder,Mild Cognitive impairment, Hyperlipidemia  In SNF  She had Episode o dizziness and weakness She was taken off her Norvasc and Flomax and eventually HCTZ was discontinued She did not have any other episodes since then Lower extremity edema Was started on Lasix which she tolerated well Left shoulder arthroplasty Was seen by Dr. Onnie Graham and is off her sling.  She does not have any restrictions for her shoulder movement now.  Still slightly weak Left mandibular fracture Was seen by Dr. Santiago Bur and is now on a regular diet.  Patient is now in [place]. Her only complaint now is some worsening symptoms of restless leg  Past Medical History:  Diagnosis Date  . Acute bronchitis 05/23/2011  . Acute upper respiratory infections of unspecified site 05/23/2011  . Arthritis   . Chronic airway obstruction, not elsewhere classified 05/23/2011  . Disturbance of salivary secretion 01/31/2011  . Dizziness and giddiness 01/31/2011  . Essential tremor 04/25/2014  . External hemorrhoids without mention of complication [id number]  . Gait disorder 04/25/2014  . Insomnia, unspecified 09/12/2011  . Lumbago 01/31/2011  . Major depressive disorder, single episode, unspecified 01/31/2011  . Memory disorder 04/25/2014  . Mitral valve disorders(424.0) 01/31/2011  . Other and unspecified hyperlipidemia 01/31/2011  . Other convulsions 01/31/2011  . Other emphysema ([facility]) 01/31/2011  . Pain in joint, site unspecified 01/31/2011  . Restless legs syndrome (RLS) 09/12/2011  . Retinal detachment with retinal defect of right  eye 2011   right eye twice  . Senile osteoporosis 01/31/2011  . Spontaneous ecchymoses 01/31/2011  . Stiffness of joints,  not elsewhere classified, multiple sites 01/31/2011  . Unspecified essential hypertension 01/31/2011    Past Surgical History:  Procedure Laterality Date  . ABDOMINAL HYSTERECTOMY  06/21/2003   TAH/BSO, omenectomy PSB resect, Stg IC cystadenofibroma  . CHOLECYSTECTOMY  2005   Dr. Marlou Starks  . ELBOW SURGERY Right 2008   broken   Dr. Apolonio Schneiders  . EYE SURGERY    . RETINAL DETACHMENT SURGERY N/A    two  . REVERSE SHOULDER ARTHROPLASTY Left 05/06/2019   Procedure: REVERSE SHOULDER ARTHROPLASTY;  Surgeon: Justice Britain, MD;  Location: [facility];  Service: Orthopedics;  Laterality: Left;  159min  . ROTATOR CUFF REPAIR Right 2012   Dr. Theda Sers  . SQUAMOUS CELL CARCINOMA EXCISION Bilateral 2012, 8/14   [place] on legs   Dr. Sarajane Jews  . TONSILLECTOMY  1941  . VIDEO BRONCHOSCOPY WITH ENDOBRONCHIAL NAVIGATION N/A 11/29/2015   Procedure: VIDEO BRONCHOSCOPY WITH ENDOBRONCHIAL NAVIGATION;  Surgeon: Collene Gobble, MD;  Location: [facility];  Service: Thoracic;  Laterality: N/A;      reports that she quit smoking about 31 years ago. Her smoking use included cigarettes. She has a 40.00 pack-year smoking history. She has never used smokeless tobacco. She reports that she does not drink alcohol or use drugs. Social History   Socioeconomic History  . Marital status: Divorced    Spouse name: Not on file  . Number of children: 0  . Years of education: Not on file  . Highest education level: Not on file  Occupational History  . Occupation: retired Tourist information centre manager for [employer]  Tobacco Use  . Smoking status: Former Smoker    Packs/day: 1.00    Years: 40.00    Pack years: 40.00    Types: Cigarettes    Quit date: 04/08/1988    Years since quitting: 31.2  . Smokeless tobacco: Never Used  . Tobacco comment: quit in 1990  Substance and Sexual Activity  . Alcohol use: No    Alcohol/week: 0.0 standard drinks  . Drug use: No  . Sexual activity: Never  Other Topics Concern  . Not on file  Social History Narrative    L[facility] at [facility]   No children   Divorced   Exercise climbs steps 4 flights daily PT for [employer]   Alcohol none   Stopped smoking 1989   Patient drinks 3 large sodas daily.   Patient is right handed.   Social Determinants of Health   Financial Resource Strain:   . Difficulty of Paying Living Expenses:   Food Insecurity:   . Worried About Charity fundraiser in the Last Year:   . Arboriculturist in the Last Year:   Transportation Needs:   . Film/video editor (Medical):   [name] Lack of Transportation (Non-Medical):   Physical Activity:   . Days of Exercise per Week:   . Minutes of Exercise per Session:   Stress:   . Feeling of Stress :   Social Connections:   . Frequency of Communication with Friends and Family:   . Frequency of Social Gatherings with Friends and Family:   . Attends Religious Services:   . Active Member of Clubs or Organizations:   . Attends Archivist Meetings:   [name] Marital Status:   Intimate Partner Violence:   . Fear of Current or Ex-Partner:   . Emotionally  Abused:   [name] Physically Abused:   . Sexually Abused:    Functional Status Survey:    Allergies  Allergen Reactions  . Latex Other (See Comments)    Swelling  . Sulfa Antibiotics Nausea And Vomiting  . Tetracyclines & Related     Pertinent  Health Maintenance Due  Topic Date Due  . MAMMOGRAM  06/14/2015  . INFLUENZA VACCINE  Completed  . DEXA SCAN  Completed  . PNA vac Low Risk Adult  Completed    Medications: Allergies as of 06/25/2019      Reactions   Latex Other (See Comments)   Swelling   Sulfa Antibiotics Nausea And Vomiting   Tetracyclines & Related       Medication List       Accurate as of June 25, 2019  2:14 PM. If you have any questions, ask your nurse or doctor.        acetaminophen 325 MG tablet Commonly known as: TYLENOL Take 650 mg by mouth every 4 (four) hours as needed for mild pain.   acetaminophen 325 MG tablet Commonly known  as: TYLENOL Take 650 mg by mouth daily. At 4pm.   alendronate 70 MG tablet Commonly known as: FOSAMAX Take 70 mg by mouth once a week. Take on Mondays   aspirin 81 MG tablet Take 81 mg by mouth daily.   calcium carbonate 1250 (500 Ca) MG tablet Commonly known as: OS-CAL - dosed in mg of elemental calcium Take 1 tablet by mouth daily.   cholecalciferol 1000 units tablet Commonly known as: VITAMIN D Take 1 tablet (1,000 Units total) by mouth daily.   furosemide 20 MG tablet Commonly known as: LASIX Take 20 mg by mouth every Monday, Wednesday, and Friday.   hydrocortisone 2.5 % cream Apply 1 application topically as needed (for psoriasis).   ketoconazole 2 % cream Commonly known as: NIZORAL Apply 1 application topically as needed (for psoriasis).   lamoTRIgine 150 MG tablet Commonly known as: LAMICTAL TAKE 1 TABLET BY MOUTH TWICE DAILY.   levETIRAcetam 500 MG tablet Commonly known as: KEPPRA Take 500 mg by mouth at bedtime.   levETIRAcetam 500 MG tablet Commonly known as: KEPPRA TAKE 1/2 TABLET IN THE AM AND 1 TABLET AT BEDTIME.   memantine 10 MG tablet Commonly known as: NAMENDA TAKE 1 TABLET BY MOUTH TWICE DAILY.   polyethylene glycol 17 g packet Commonly known as: MIRALAX / GLYCOLAX Take 17 g by mouth at bedtime.   potassium chloride 10 MEQ tablet Commonly known as: KLOR-CON Take 10 mEq by mouth daily.   pramipexole 0.25 MG tablet Commonly known as: MIRAPEX Take 0.25 mg by mouth at bedtime.   ProAir HFA 108 (90 Base) MCG/ACT inhaler Generic drug: albuterol 2 PUFFS INTO THE LUNGS EVERY 4 HOURS IF NEEDED FOR WHEEZING/SHORTNESSOF BREATH.   sennosides-docusate sodium 8.6-50 MG tablet Commonly known as: SENOKOT-S Take 1 tablet by mouth daily.   sennosides-docusate sodium 8.6-50 MG tablet Commonly known as: SENOKOT-S Take 1 tablet by mouth at bedtime.   simvastatin 10 MG tablet Commonly known as: ZOCOR Take 10 mg by mouth daily. Reported on 06/27/2015    tiotropium 18 MCG inhalation capsule Commonly known as: Spiriva HandiHaler INHALE CONTENTS OF ONE CAPSULE ONCE DAILY FOR COPD.       Review of Systems  Vitals:   06/25/19 1400  BP: 120/70  Pulse: 84  Resp: 20  Temp: 98 F (36.7 C)  SpO2: 94%  Weight: 151 lb 11.2 oz (68.8 kg)  Height: 5\' 2"  (1.575 m)   Body mass index is 27.75 kg/m. Physical Exam Constitutional: Oriented to person, place, and time. Well-developed and well-nourished.  HENT:  Head: Normocephalic.  Mouth/Throat: Oropharynx is clear and moist.  Eyes: Pupils are equal, round, and reactive to light.  Neck: Neck supple.  Cardiovascular: Normal rate and normal heart sounds.  No murmur heard. Pulmonary/Chest: Effort normal and breath sounds normal. No respiratory distress. No wheezes. She has no rales.  Abdominal: Soft. Bowel sounds are normal. No distension. There is no tenderness. There is no rebound.  Musculoskeletal: Mild Edema with Chronic Venous Changes Left shoulder moving with no Pain Lymphadenopathy: none Neurological: Alert and oriented to person, place, and time.  Skin: Skin is warm and dry.  Psychiatric: Normal mood and affect. Behavior is normal. Thought content normal.   Labs reviewed: Basic Metabolic Panel: Recent Labs    05/08/19 0327 05/08/19 0327 05/09/19 [id number] 05/09/19 [id number] 05/10/19 0341 05/10/19 0341 05/12/19 0334 05/12/19 0334 05/13/19 0403 05/13/19 0403 05/20/19 0000 05/26/19 0000 06/08/19 0000  NA 138   < > 138   < > 139   < > 139   < > 138  --  140 142 142  K 3.6   < > 3.3*   < > 3.5   < > 3.3*   < > 3.6   < > 3.4 4.0 4.2  CL 103   < > 100   < > 104   < > 104   < > 105   < > 102 102 103  CO2 26   < > 30   < > 28   < > 25   < > 24   < > 30* 31* 28*  GLUCOSE 106*   < > 104*   < > 99  --  97  --  101*  --   --   --   --   BUN 17   < > 15   < > 13   < > 19   < > 16  --  14 16 16   CREATININE 0.48   < > 0.52   < > 0.55   < > 0.62   < > 0.60  --  0.7 0.9 0.9  CALCIUM 8.3*   < >  8.4*   < > 8.7*   < > 8.7*   < > 8.4*   < > 8.9 9.6 9.8  MG 1.8  --  1.8  --  2.0  --   --   --   --   --   --   --   --   PHOS 2.1*  --  3.3  --  3.0  --   --   --   --   --   --   --   --    < > = values in this interval not displayed.   Liver Function Tests: Recent Labs    05/08/19 0327 05/08/19 0327 05/09/19 0338 05/10/19 0341 05/20/19 0000  AST 29   < > 26 25 16   ALT 23   < > 22 21 15   ALKPHOS 106   < > 97 91 254*  BILITOT 1.5*  --  1.1 1.4*  --   PROT 5.7*  --  5.6* 5.5*  --   ALBUMIN 3.1*   < > 2.9* 2.9* 3.5   < > = values in this interval not displayed.   No results for input(s): LIPASE,  AMYLASE in the last 8760 hours. No results for input(s): AMMONIA in the last 8760 hours. CBC: Recent Labs    05/04/19 0025 05/04/19 0449 05/11/19 0422 05/11/19 0422 05/12/19 0334 05/13/19 0403 05/20/19 0000  WBC 13.3*   < > 7.6   < > 7.4 8.3 5.9  NEUTROABS 11.3*  --   --   --   --   --  4,260  HGB 13.6   < > 10.8*   < > 10.8* 11.3* 12.1  HCT 41.6   < > 33.4*   < > 33.6* 36.2 36  MCV 93.7   < > 95.4  --  95.2 97.1  --   PLT 198   < > 220   < > 247 275 364   < > = values in this interval not displayed.   Cardiac Enzymes: No results for input(s): CKTOTAL, CKMB, CKMBINDEX, TROPONINI in the last 8760 hours. BNP: Invalid input(s): POCBNP CBG: No results for input(s): GLUCAP in the last 8760 hours.  Procedures and Imaging Studies During Stay: No results found.  Assessment/Plan:   S/P Arthroplasty Doing well No Pain Will continue to work with Therapy in [place]  Closed fracture of right condylar process of mandible with routine healing, Now on Regular Diet Seizure disorder ([place]) Controlled on Lamictal And Keprra Essential hypertension Discontinued Norvasc and HCTZ On Low dose of Lasix Repeat BMP in few days  Dizziness Resolved off Norvasc Restless legs syndrome (RLS) Increase Mirapex to 0.5 mg for 1 week then back to 0.25 mg Repeat BMP ,Magnesium  Senile  osteoporosis Fosamax  Urinary retention Resolved now Depression Stable Hyperlipidemia Continue Statin Cognitive Impairment On Namenda MMSE was 30/30 in Neurology Office H/o Thyroid Nodule Will need follow up as outpatient COPD On spiriva   Discharge Planning More then 30 min   Future labs/tests needed:  BMP,CBC,Magnesium Level TSh

## 2019-06-29 LAB — TSH: TSH: 1.79 (ref 0.41–5.90)

## 2019-06-29 LAB — BASIC METABOLIC PANEL
BUN: 15 (ref 4–21)
CO2: 27 — AB (ref 13–22)
Chloride: 106 (ref 99–108)
Creatinine: 0.7 (ref 0.5–1.1)
Glucose: 85
Potassium: 4 (ref 3.4–5.3)
Sodium: 141 (ref 137–147)

## 2019-06-29 LAB — HEPATIC FUNCTION PANEL
ALT: 7 (ref 7–35)
AST: 14 (ref 13–35)
Alkaline Phosphatase: 129 — AB (ref 25–125)

## 2019-06-29 LAB — COMPREHENSIVE METABOLIC PANEL
Albumin: 4 (ref 3.5–5.0)
Calcium: 9.2 (ref 8.7–10.7)
GFR calc Af Amer: 88
GFR calc non Af Amer: 76
Globulin: 2.5

## 2019-06-29 LAB — CBC AND DIFFERENTIAL
HCT: 40 (ref 36–46)
Hemoglobin: 13.3 (ref 12.0–16.0)
Platelets: 251 (ref 150–399)
WBC: 4.9

## 2019-06-29 LAB — CBC: RBC: 4.44 (ref 3.87–5.11)

## 2019-07-07 ENCOUNTER — Non-Acute Institutional Stay: Payer: Medicare PPO | Admitting: Nurse Practitioner

## 2019-07-07 ENCOUNTER — Encounter: Payer: Self-pay | Admitting: Nurse Practitioner

## 2019-07-07 DIAGNOSIS — R413 Other amnesia: Secondary | ICD-10-CM | POA: Diagnosis not present

## 2019-07-07 DIAGNOSIS — H00011 Hordeolum externum right upper eyelid: Secondary | ICD-10-CM

## 2019-07-07 DIAGNOSIS — H5789 Other specified disorders of eye and adnexa: Secondary | ICD-10-CM

## 2019-07-07 DIAGNOSIS — I739 Peripheral vascular disease, unspecified: Secondary | ICD-10-CM | POA: Diagnosis not present

## 2019-07-07 DIAGNOSIS — J438 Other emphysema: Secondary | ICD-10-CM

## 2019-07-07 NOTE — Progress Notes (Addendum)
Location:   [place] Room Number: [place] of Service:  [id number] [phone]) Provider: Lennie Odor Lauralei Clouse NP  [name], MD  Patient Care Team: [name], MD as PCP - General (Internal Medicine) [name], MD as Consulting Physician (Ophthalmology) [name], MD as Consulting Physician (Cardiology) [name], MD as Consulting Physician (Dermatology) [name], MD as Consulting Physician (Orthopedic Surgery) [name], MD as Consulting Physician (Orthopedic Surgery) [name], MD as Consulting Physician (Orthopedic Surgery) Cazenovia, Philo Ivann Trimarco X, NP as Nurse Practitioner (Nurse Practitioner) [name], MD as Consulting Physician (Neurology)  Extended Emergency Contact Information Primary Emergency Contact: [name],[name] Address: [address]          [place] [address] [place] Phone: [phone] Relation: Sister Secondary Emergency Contact: [name] [place] Phone: [phone] Relation: Sister  Code Status: DNR Goals of care: Advanced Directive information Advanced Directives 07/07/2019  Does Patient Have a Medical Advance Directive? Yes  Type of Advance Directive Out of facility DNR (pink MOST or yellow form)  Does patient want to make changes to medical advance directive? No - Patient declined  Copy of [place] in Chart? -  Pre-existing out of facility DNR order (yellow form or pink MOST form) Yellow form placed in chart (order not valid for inpatient use);Pink MOST form placed in chart (order not valid for inpatient use)     Chief Complaint  Patient presents with  . Acute Visit    Right Eye Redness    HPI:  Pt is a 86 y.o. female seen today for an acute visit for c/o right eye discomfort, better today, the patient is applying warm compress self, no change of vision, no c/o itching or pain in eyes, no noted conjunctival  redness. Hx of edema BLE, on Furosemide 20mg  3x/wk. She takes Memantine 10mg  bid for memory. COPD, stable, on Spiriva, Proair HFA   Past Medical History:  Diagnosis Date  . Acute bronchitis 05/23/2011  . Acute upper respiratory infections of unspecified site 05/23/2011  . Arthritis   . Chronic airway obstruction, not elsewhere classified 05/23/2011  . Disturbance of salivary secretion 01/31/2011  . Dizziness and giddiness 01/31/2011  . Essential tremor 04/25/2014  . External hemorrhoids without mention of complication [id number]  . Gait disorder 04/25/2014  . Insomnia, unspecified 09/12/2011  . Lumbago 01/31/2011  . Major depressive disorder, single episode, unspecified 01/31/2011  . Memory disorder 04/25/2014  . Mitral valve disorders(424.0) 01/31/2011  . Other and unspecified hyperlipidemia 01/31/2011  . Other convulsions 01/31/2011  . Other emphysema ([place]) 01/31/2011  . Pain in joint, site unspecified 01/31/2011  . Restless legs syndrome (RLS) 09/12/2011  . Retinal detachment with retinal defect of right eye 2011   right eye twice  . Senile osteoporosis 01/31/2011  . Spontaneous ecchymoses 01/31/2011  . Stiffness of joints, not elsewhere classified, multiple sites 01/31/2011  . Unspecified essential hypertension 01/31/2011   Past Surgical History:  Procedure Laterality Date  . ABDOMINAL HYSTERECTOMY  06/21/2003   TAH/BSO, omenectomy PSB resect, Stg IC cystadenofibroma  . CHOLECYSTECTOMY  2005   Dr. Marlou Starks  . ELBOW SURGERY Right 2008   broken   Dr. Apolonio Schneiders  . EYE SURGERY    . RETINAL DETACHMENT SURGERY N/A    two  . REVERSE SHOULDER ARTHROPLASTY Left 05/06/2019   Procedure: REVERSE SHOULDER ARTHROPLASTY;  Surgeon: Justice Britain, MD;  Location: [facility];  Service: Orthopedics;  Laterality:  Left;  139min  . ROTATOR CUFF REPAIR Right 2012   Dr. Theda Sers  . SQUAMOUS CELL CARCINOMA EXCISION Bilateral 2012, 8/14   [place] on legs   Dr. Sarajane Jews  . TONSILLECTOMY  1941  . VIDEO BRONCHOSCOPY  WITH ENDOBRONCHIAL NAVIGATION N/A 11/29/2015   Procedure: VIDEO BRONCHOSCOPY WITH ENDOBRONCHIAL NAVIGATION;  Surgeon: Collene Gobble, MD;  Location: [facility];  Service: Thoracic;  Laterality: N/A;    Allergies  Allergen Reactions  . Latex Other (See Comments)    Swelling  . Sulfa Antibiotics Nausea And Vomiting  . Tetracyclines & Related     Allergies as of 07/07/2019      Reactions   Latex Other (See Comments)   Swelling   Sulfa Antibiotics Nausea And Vomiting   Tetracyclines & Related       Medication List       Accurate as of July 07, 2019 11:59 PM. If you have any questions, ask your nurse or doctor.        STOP taking these medications   calcium carbonate 1250 (500 Ca) MG tablet Commonly known as: OS-CAL - dosed in mg of elemental calcium Stopped by: Maribelle Hopple X Albirda Shiel, NP     TAKE these medications   acetaminophen 325 MG tablet Commonly known as: TYLENOL Take 650 mg by mouth every 4 (four) hours as needed for mild pain.   acetaminophen 325 MG tablet Commonly known as: TYLENOL Take 650 mg by mouth daily. At 4pm.   alendronate 70 MG tablet Commonly known as: FOSAMAX Take 70 mg by mouth once a week. Take on Mondays   aspirin 81 MG tablet Take 81 mg by mouth daily.   cholecalciferol 1000 units tablet Commonly known as: VITAMIN D Take 1 tablet (1,000 Units total) by mouth daily.   furosemide 20 MG tablet Commonly known as: LASIX Take 20 mg by mouth every Monday, Wednesday, and Friday.   hydrocortisone 2.5 % cream Apply 1 application topically as needed (for psoriasis).   ketoconazole 2 % cream Commonly known as: NIZORAL Apply 1 application topically as needed (for psoriasis).   lamoTRIgine 150 MG tablet Commonly known as: LAMICTAL TAKE 1 TABLET BY MOUTH TWICE DAILY.   levETIRAcetam 500 MG tablet Commonly known as: KEPPRA Take 500 mg by mouth at bedtime. What changed: Another medication with the same name was removed. Continue taking this medication, and  follow the directions you see here. Changed by: Carle Dargan X Julez Huseby, NP   levETIRAcetam 250 MG tablet Commonly known as: KEPPRA Take 250 mg by mouth daily. What changed: Another medication with the same name was removed. Continue taking this medication, and follow the directions you see here. Changed by: Kalli Greenfield X Akasha Melena, NP   memantine 10 MG tablet Commonly known as: NAMENDA TAKE 1 TABLET BY MOUTH TWICE DAILY.   polyethylene glycol 17 g packet Commonly known as: MIRALAX / GLYCOLAX Take 17 g by mouth at bedtime.   potassium chloride 10 MEQ tablet Commonly known as: KLOR-CON Take 10 mEq by mouth daily.   pramipexole 0.25 MG tablet Commonly known as: MIRAPEX Take 0.25 mg by mouth at bedtime.   ProAir HFA 108 (90 Base) MCG/ACT inhaler Generic drug: albuterol 2 PUFFS INTO THE LUNGS EVERY 4 HOURS IF NEEDED FOR WHEEZING/SHORTNESSOF BREATH.   sennosides-docusate sodium 8.6-50 MG tablet Commonly known as: SENOKOT-S Take 1 tablet by mouth in the morning and at bedtime. What changed: Another medication with the same name was removed. Continue taking this medication, and follow the directions you  see here. Changed by: Ardra Kuznicki X Orla Estrin, NP   simvastatin 10 MG tablet Commonly known as: ZOCOR Take 10 mg by mouth daily. Reported on 06/27/2015   tiotropium 18 MCG inhalation capsule Commonly known as: Spiriva HandiHaler INHALE CONTENTS OF ONE CAPSULE ONCE DAILY FOR COPD.       Review of Systems  Constitutional: Negative for activity change, appetite change, fatigue and fever.  HENT: Positive for hearing loss. Negative for congestion and voice change.   Eyes: Negative for photophobia, pain, discharge, redness, itching and visual disturbance.       Right eye upper eyelid discomfort.   Respiratory: Positive for shortness of breath. Negative for cough.        DOE  Cardiovascular: Positive for leg swelling. Negative for chest pain and palpitations.  Gastrointestinal: Negative for abdominal distention,  abdominal pain and constipation.  Genitourinary: Negative for difficulty urinating, dysuria and urgency.  Musculoskeletal: Positive for arthralgias, gait problem and joint swelling.  Skin: Positive for color change.       BLE discoloration  Neurological: Positive for seizures. Negative for syncope, light-headedness and headaches.       Memory lapses.   Psychiatric/Behavioral: Negative for agitation, behavioral problems and sleep disturbance. The patient is not nervous/anxious.     Immunization History  Administered Date(s) Administered  . Influenza Split 01/06/2014  . Influenza Whole 01/07/2012, 01/06/2013  . Influenza, High Dose Seasonal PF 12/25/2015, 01/20/2017  . Influenza,inj,Quad PF,6+ Mos 12/21/2014  . Influenza-Unspecified 12/09/2018  . Moderna SARS-COVID-2 Vaccination 06/05/2019  . Pneumococcal Conjugate-13 02/01/2014  . Pneumococcal Polysaccharide-23 04/08/2004  . Td 04/08/2002  . Tdap 04/09/2011  . Zoster 04/08/2008  . Zoster Recombinat (Shingrix) 08/27/2017   Pertinent  Health Maintenance Due  Topic Date Due  . MAMMOGRAM  06/14/2015  . INFLUENZA VACCINE  11/07/2019  . DEXA SCAN  Completed  . PNA vac Low Risk Adult  Completed   Fall Risk  03/24/2014 10/15/2012  Falls in the past year? No No   Functional Status Survey:    Vitals:   07/07/19 1455  BP: 130/68  Pulse: 68  Resp: 18  Temp: (!) 97.1 F (36.2 C)  SpO2: 97%  Weight: 155 lb (70.3 kg)  Height: 5\' 2"  (1.575 m)   Body mass index is 28.35 kg/m. Physical Exam Vitals and nursing note reviewed.  Constitutional:      General: She is not in acute distress.    Appearance: Normal appearance. She is not ill-appearing.  HENT:     Head: Normocephalic and atraumatic.     Nose: Nose normal.     Mouth/Throat:     Mouth: Mucous membranes are moist.  Eyes:     General: No scleral icterus.       Right eye: Hordeolum present.     Extraocular Movements: Extraocular movements intact.     Conjunctiva/sclera:      Right eye: Right conjunctiva is not injected.     Left eye: Left conjunctiva is not injected.     Pupils: Pupils are equal, round, and reactive to light.     Comments: Mid right upper eyelid redness, mild swelling, stye noted.   Cardiovascular:     Rate and Rhythm: Normal rate and regular rhythm.     Heart sounds: Murmur present.     Comments: PD pulses are not felt.  Pulmonary:     Breath sounds: Rales present. No wheezing.     Comments: Decreased air entry to both lungs. Bibasilar rales.  Abdominal:  General: Bowel sounds are normal. There is no distension.     Palpations: Abdomen is soft.     Tenderness: There is no abdominal tenderness.  Musculoskeletal:     Cervical back: Normal range of motion and neck supple.     Right lower leg: Edema present.     Left lower leg: Edema present.     Comments: Trace edema BLE.    Skin:    General: Skin is warm and dry.     Comments: Brownish skin discoloration BLE, cold to touch L>R, not felt DP pulses.   Neurological:     General: No focal deficit present.     Mental Status: She is alert and oriented to person, place, and time. Mental status is at baseline.     Motor: No weakness.     Coordination: Coordination normal.     Gait: Gait abnormal.  Psychiatric:        Mood and Affect: Mood normal.        Behavior: Behavior normal.        Thought Content: Thought content normal.        Judgment: Judgment normal.     Labs reviewed: Recent Labs    05/08/19 0327 05/08/19 0327 05/09/19 [id number] 05/09/19 [id number] 05/10/19 0341 05/10/19 0341 05/12/19 0334 05/12/19 0334 05/13/19 0403 05/20/19 0000 05/26/19 0000 06/08/19 0000 06/29/19 0000  NA 138   < > 138   < > 139   < > 139   < > 138   < > 142 142 141  K 3.6   < > 3.3*   < > 3.5   < > 3.3*   < > 3.6   < > 4.0 4.2 4.0  CL 103   < > 100   < > 104   < > 104   < > 105   < > 102 103 106  CO2 26   < > 30   < > 28   < > 25   < > 24   < > 31* 28* 27*  GLUCOSE 106*   < > 104*   < > 99  --   97  --  101*  --   --   --   --   BUN 17   < > 15   < > 13   < > 19   < > 16   < > 16 16 15   CREATININE 0.48   < > 0.52   < > 0.55   < > 0.62   < > 0.60   < > 0.9 0.9 0.7  CALCIUM 8.3*   < > 8.4*   < > 8.7*   < > 8.7*   < > 8.4*   < > 9.6 9.8 9.2  MG 1.8  --  1.8  --  2.0  --   --   --   --   --   --   --   --   PHOS 2.1*  --  3.3  --  3.0  --   --   --   --   --   --   --   --    < > = values in this interval not displayed.   Recent Labs    05/08/19 0327 05/08/19 0327 05/09/19 0338 05/09/19 0338 05/10/19 0341 05/20/19 0000 06/29/19 0000  AST 29   < > 26   < > 25 16 14   ALT 23   < >  22   < > 21 15 7   ALKPHOS 106   < > 97   < > 91 254* 129*  BILITOT 1.5*  --  1.1  --  1.4*  --   --   PROT 5.7*  --  5.6*  --  5.5*  --   --   ALBUMIN 3.1*   < > 2.9*   < > 2.9* 3.5 4.0   < > = values in this interval not displayed.   Recent Labs    05/04/19 0025 05/04/19 0449 05/11/19 0422 05/11/19 0422 05/12/19 0334 05/12/19 0334 05/13/19 0403 05/20/19 0000 06/29/19 0000  WBC 13.3*   < > 7.6   < > 7.4   < > 8.3 5.9 4.9  NEUTROABS 11.3*  --   --   --   --   --   --  4,260  --   HGB 13.6   < > 10.8*   < > 10.8*   < > 11.3* 12.1 13.3  HCT 41.6   < > 33.4*   < > 33.6*   < > 36.2 36 40  MCV 93.7   < > 95.4  --  95.2  --  97.1  --   --   PLT 198   < > 220   < > 247   < > 275 364 251   < > = values in this interval not displayed.   Lab Results  Component Value Date   TSH 1.79 06/29/2019   No results found for: HGBA1C Lab Results  Component Value Date   CHOL 249 (H) 06/27/2015   HDL 70 06/27/2015   LDLCALC 160 (H) 06/27/2015   TRIG 97 06/27/2015   CHOLHDL 3.6 06/27/2015    Significant Diagnostic Results in last 30 days:  No results found.  Assessment/Plan: Redness of right eye Discomfort, redness right eyelid.   Stye Right upper eyelid, no conjunctivitis, continue warm compress, 0.3% Ocuflox sol 1-2gtt R eye x7 days. Observe.   Memory disorder Continue [facility] for safety,  care assistance, continue Memantine for memory.   PAD (peripheral artery disease) (HCC) Persisted trace edema BLE, continue Furosemide.   COPD (chronic obstructive pulmonary disease) (HCC) Stable, continue Spiriva, Proair, chronic rales in lung bases.     Family/ staff Communication: plan of care reviewed with the patient and charge nurse.   Labs/tests ordered: none  Time spend 40 minutes.

## 2019-07-07 NOTE — Assessment & Plan Note (Addendum)
Right upper eyelid, no conjunctivitis, continue warm compress, 0.3% Ocuflox sol 1-2gtt R eye x7 days. Observe.

## 2019-07-07 NOTE — Assessment & Plan Note (Signed)
Discomfort, redness right eyelid.

## 2019-07-07 NOTE — Assessment & Plan Note (Signed)
Continue [facility] for safety, care assistance, continue Memantine for memory.

## 2019-07-07 NOTE — Assessment & Plan Note (Signed)
Stable, continue Spiriva, Proair, chronic rales in lung bases.

## 2019-07-07 NOTE — Assessment & Plan Note (Signed)
Persisted trace edema BLE, continue Furosemide.

## 2019-07-08 ENCOUNTER — Encounter: Payer: Self-pay | Admitting: Nurse Practitioner

## 2019-07-13 ENCOUNTER — Encounter: Payer: Self-pay | Admitting: Nurse Practitioner

## 2019-07-13 ENCOUNTER — Non-Acute Institutional Stay: Payer: Medicare PPO | Admitting: Nurse Practitioner

## 2019-07-13 DIAGNOSIS — R609 Edema, unspecified: Secondary | ICD-10-CM

## 2019-07-13 DIAGNOSIS — K5901 Slow transit constipation: Secondary | ICD-10-CM | POA: Diagnosis not present

## 2019-07-13 DIAGNOSIS — R413 Other amnesia: Secondary | ICD-10-CM

## 2019-07-13 DIAGNOSIS — M79661 Pain in right lower leg: Secondary | ICD-10-CM

## 2019-07-13 DIAGNOSIS — L03316 Cellulitis of umbilicus: Secondary | ICD-10-CM | POA: Diagnosis not present

## 2019-07-13 NOTE — Assessment & Plan Note (Signed)
Trace edema BLE, continue Furosemide 20mg  qd 3x/week.

## 2019-07-13 NOTE — Assessment & Plan Note (Signed)
Functioning well in AL [facility], continue Memantine 10mg  bid.

## 2019-07-13 NOTE — Assessment & Plan Note (Addendum)
Umbilical area redness, tenderness, warmth, and swelling, Hx of infected umbilical button, will treat with 7 day course of Keflex 500mg  q8hr po, Florastor bid x 7 days, apply 0.5% triamcinolone cream bid to area to reduce irritation.

## 2019-07-13 NOTE — Progress Notes (Signed)
Location:  [facility] Room Number: 911-A Place of Service:  [id number]) Provider: Lennie Odor Deissy Guilbert NP  [name], MD  Patient Care Team: [name], MD as PCP - General (Internal Medicine) [name], MD as Consulting Physician (Ophthalmology) [name], MD as Consulting Physician (Cardiology) [name], MD as Consulting Physician (Dermatology) [name], MD as Consulting Physician (Orthopedic Surgery) [name], MD as Consulting Physician (Orthopedic Surgery) [name], MD as Consulting Physician (Orthopedic Surgery) [place], [place] Samhitha Rosen X, NP as Nurse Practitioner (Nurse Practitioner) [name], MD as Consulting Physician (Neurology)  Extended Emergency Contact Information Primary Emergency Contact: [name],[name] Address: [address]          [place] [address] [place] Phone: [phone] Relation: Sister Secondary Emergency Contact: [name] [place] Phone: [phone] Relation: Sister  Code Status: DNR Goals of care: Advanced Directive information Advanced Directives 07/13/2019  Does Patient Have a Medical Advance Directive? Yes  Type of Advance Directive Out of facility DNR (pink MOST or yellow form)  Does patient want to make changes to medical advance directive? No - Patient declined  Copy of [place] in Chart? -  Pre-existing out of facility DNR order (yellow form or pink MOST form) Pink MOST form placed in chart (order not valid for inpatient use)     Chief Complaint  Patient presents with  . Acute Visit    Patient is seen for a sore in her umbilicus.     HPI:  Pt is an 86 y.o. female seen today for an acute visit for umbilical area redness, tenderness, warmth, and swelling, Hx of infected umbilical button.  Hx of constipation, stable, on Senokot S bid, MiraLax qd. The patient resides in [place] [facility] for safety, care  assistance, on Memantine 10mg  bid for memroy. Edema BLE, trace, on Furosemide 20mg  3x/wk. OA pain, controlled, on Tylenol 650mg  qd.    Past Medical History:  Diagnosis Date  . Acute bronchitis 05/23/2011  . Acute upper respiratory infections of unspecified site 05/23/2011  . Arthritis   . Chronic airway obstruction, not elsewhere classified 05/23/2011  . Disturbance of salivary secretion 01/31/2011  . Dizziness and giddiness 01/31/2011  . Essential tremor 04/25/2014  . External hemorrhoids without mention of complication [id number]  . Gait disorder 04/25/2014  . Insomnia, unspecified 09/12/2011  . Lumbago 01/31/2011  . Major depressive disorder, single episode, unspecified 01/31/2011  . Memory disorder 04/25/2014  . Mitral valve disorders(424.0) 01/31/2011  . Other and unspecified hyperlipidemia 01/31/2011  . Other convulsions 01/31/2011  . Other emphysema ([place]) 01/31/2011  . Pain in joint, site unspecified 01/31/2011  . Restless legs syndrome (RLS) 09/12/2011  . Retinal detachment with retinal defect of right eye 2011   right eye twice  . Senile osteoporosis 01/31/2011  . Spontaneous ecchymoses 01/31/2011  . Stiffness of joints, not elsewhere classified, multiple sites 01/31/2011  . Unspecified essential hypertension 01/31/2011   Past Surgical History:  Procedure Laterality Date  . ABDOMINAL HYSTERECTOMY  06/21/2003   TAH/BSO, omenectomy PSB resect, Stg IC cystadenofibroma  . CHOLECYSTECTOMY  2005   Dr. Marlou Starks  . ELBOW SURGERY Right 2008   broken   Dr. Apolonio Schneiders  . EYE SURGERY    . RETINAL DETACHMENT SURGERY N/A    two  . REVERSE SHOULDER ARTHROPLASTY Left 05/06/2019   Procedure: REVERSE SHOULDER ARTHROPLASTY;  Surgeon: Justice Britain, MD;  Location: [facility];  Service: Orthopedics;  Laterality: Left;  193min  . ROTATOR CUFF REPAIR Right 2012   Dr. Theda Sers  . SQUAMOUS CELL CARCINOMA EXCISION Bilateral 2012, 8/14   [place] on legs   Dr. Sarajane Jews  . TONSILLECTOMY  1941  . VIDEO BRONCHOSCOPY  WITH ENDOBRONCHIAL NAVIGATION N/A 11/29/2015   Procedure: VIDEO BRONCHOSCOPY WITH ENDOBRONCHIAL NAVIGATION;  Surgeon: Collene Gobble, MD;  Location: [facility] OR;  Service: Thoracic;  Laterality: N/A;    Allergies  Allergen Reactions  . Latex Other (See Comments)    Swelling  . Sulfa Antibiotics Nausea And Vomiting  . Tetracyclines & Related     Allergies as of 07/13/2019      Reactions   Latex Other (See Comments)   Swelling   Sulfa Antibiotics Nausea And Vomiting   Tetracyclines & Related       Medication List       Accurate as of July 13, 2019  4:27 PM. If you have any questions, ask your nurse or doctor.        acetaminophen 325 MG tablet Commonly known as: TYLENOL Take 650 mg by mouth every 4 (four) hours as needed for mild pain.   acetaminophen 325 MG tablet Commonly known as: TYLENOL Take 650 mg by mouth daily. At 4pm.   alendronate 70 MG tablet Commonly known as: FOSAMAX Take 70 mg by mouth once a week. Take on Mondays   aspirin 81 MG tablet Take 81 mg by mouth daily.   cholecalciferol 1000 units tablet Commonly known as: VITAMIN D Take 1 tablet (1,000 Units total) by mouth daily.   furosemide 20 MG tablet Commonly known as: LASIX Take 20 mg by mouth every Monday, Wednesday, and Friday.   hydrocortisone 2.5 % cream Apply 1 application topically as needed (for psoriasis).   ketoconazole 2 % cream Commonly known as: NIZORAL Apply 1 application topically as needed (for psoriasis).   lamoTRIgine 150 MG tablet Commonly known as: LAMICTAL TAKE 1 TABLET BY MOUTH TWICE DAILY.   levETIRAcetam 500 MG tablet Commonly known as: KEPPRA Take 500 mg by mouth at bedtime.   levETIRAcetam 250 MG tablet Commonly known as: KEPPRA Take 250 mg by mouth daily.   memantine 10 MG tablet Commonly known as: NAMENDA TAKE 1 TABLET BY MOUTH TWICE DAILY.   ofloxacin 0.3 % ophthalmic solution Commonly known as: OCUFLOX Place 2 drops into the right eye 4 (four) times daily.     polyethylene glycol 17 g packet Commonly known as: MIRALAX / GLYCOLAX Take 17 g by mouth at bedtime.   potassium chloride 10 MEQ tablet Commonly known as: KLOR-CON Take 10 mEq by mouth daily.   pramipexole 0.25 MG tablet Commonly known as: MIRAPEX Take 0.25 mg by mouth at bedtime.   ProAir HFA 108 (90 Base) MCG/ACT inhaler Generic drug: albuterol 2 PUFFS INTO THE LUNGS EVERY 4 HOURS IF NEEDED FOR WHEEZING/SHORTNESSOF BREATH.   sennosides-docusate sodium 8.6-50 MG tablet Commonly known as: SENOKOT-S Take 1 tablet by mouth in the morning and at bedtime.   simvastatin 10 MG tablet Commonly known as: ZOCOR Take 10 mg by mouth daily. Reported on 06/27/2015   tiotropium 18 MCG inhalation capsule Commonly known as: Spiriva HandiHaler INHALE CONTENTS OF ONE CAPSULE ONCE DAILY FOR COPD.       Review of Systems  Constitutional: Negative for activity change, appetite change, fatigue and fever.  HENT: Positive for hearing loss. Negative for congestion and voice change.   Eyes: Negative for visual disturbance.       Right eye  upper eyelid stye is resolving.   Respiratory: Positive for shortness of breath. Negative for cough.        DOE  Cardiovascular: Positive for leg swelling. Negative for chest pain and palpitations.  Gastrointestinal: Negative for abdominal distention, abdominal pain and constipation.  Genitourinary: Negative for difficulty urinating, dysuria and urgency.  Musculoskeletal: Positive for arthralgias and gait problem.  Skin: Positive for color change.       BLE discoloration is chronic. Redness, warmth, tenderness, and swelling in umbilical area.   Neurological: Positive for seizures. Negative for syncope, light-headedness and headaches.       Memory lapses. Hx of seizures  Psychiatric/Behavioral: Negative for agitation, behavioral problems and sleep disturbance.    Immunization History  Administered Date(s) Administered  . Influenza Split 01/06/2014  .  Influenza Whole 01/07/2012, 01/06/2013  . Influenza, High Dose Seasonal PF 12/25/2015, 01/20/2017  . Influenza,inj,Quad PF,6+ Mos 12/21/2014  . Influenza-Unspecified 12/09/2018  . Moderna SARS-COVID-2 Vaccination 06/05/2019  . Pneumococcal Conjugate-13 02/01/2014  . Pneumococcal Polysaccharide-23 04/08/2004  . Td 04/08/2002  . Tdap 04/09/2011  . Zoster 04/08/2008  . Zoster Recombinat (Shingrix) 08/27/2017   Pertinent  Health Maintenance Due  Topic Date Due  . MAMMOGRAM  06/14/2015  . INFLUENZA VACCINE  11/07/2019  . DEXA SCAN  Completed  . PNA vac Low Risk Adult  Completed   Fall Risk  03/24/2014 10/15/2012  Falls in the past year? No No   Functional Status Survey:    Vitals:   07/13/19 1025  BP: 120/70  Pulse: 74  Resp: 20  Temp: (!) 96.9 F (36.1 C)  TempSrc: Oral  SpO2: 93%  Weight: 155 lb (70.3 kg)  Height: 5' 5.5" (1.664 m)   Body mass index is 25.4 kg/m. Physical Exam Vitals and nursing note reviewed.  Constitutional:      Appearance: Normal appearance. She is not ill-appearing.  HENT:     Head: Normocephalic and atraumatic.     Nose: Nose normal.     Mouth/Throat:     Mouth: Mucous membranes are moist.  Eyes:     General:        Right eye: Hordeolum present.     Extraocular Movements: Extraocular movements intact.     Conjunctiva/sclera:     Right eye: Right conjunctiva is not injected.     Left eye: Left conjunctiva is not injected.     Pupils: Pupils are equal, round, and reactive to light.     Comments: Mid right upper eyelid style is healing   Cardiovascular:     Rate and Rhythm: Normal rate and regular rhythm.     Heart sounds: Murmur present.     Comments: PD pulses are not felt.  Pulmonary:     Breath sounds: Rales present. No wheezing.     Comments: Decreased air entry to both lungs. Bibasilar rales.  Abdominal:     General: Bowel sounds are normal. There is no distension.     Palpations: Abdomen is soft.     Tenderness: There is no  abdominal tenderness. There is no right CVA tenderness, left CVA tenderness, guarding or rebound.     Hernia: No hernia is present.     Comments: Mid abd surgical scar  Musculoskeletal:     Cervical back: Normal range of motion and neck supple.     Right lower leg: Edema present.     Left lower leg: Edema present.     Comments: Trace edema BLE.    Skin:  General: Skin is warm and dry.     Findings: Erythema present.     Comments: Brownish skin discoloration BLE.  Redness, warmth, tenderness, and swelling in umbilical area.    Neurological:     General: No focal deficit present.     Mental Status: She is alert and oriented to person, place, and time. Mental status is at baseline.     Motor: No weakness.     Coordination: Coordination normal.     Gait: Gait abnormal.  Psychiatric:        Mood and Affect: Mood normal.        Behavior: Behavior normal.        Thought Content: Thought content normal.        Judgment: Judgment normal.     Labs reviewed: Recent Labs    05/08/19 0327 05/08/19 0327 05/09/19 [id number] 05/09/19 [id number] 05/10/19 0341 05/10/19 0341 05/12/19 0334 05/12/19 0334 05/13/19 0403 05/20/19 0000 05/26/19 0000 06/08/19 0000 06/29/19 0000  NA 138   < > 138   < > 139   < > 139   < > 138   < > 142 142 141  K 3.6   < > 3.3*   < > 3.5   < > 3.3*   < > 3.6   < > 4.0 4.2 4.0  CL 103   < > 100   < > 104   < > 104   < > 105   < > 102 103 106  CO2 26   < > 30   < > 28   < > 25   < > 24   < > 31* 28* 27*  GLUCOSE 106*   < > 104*   < > 99  --  97  --  101*  --   --   --   --   BUN 17   < > 15   < > 13   < > 19   < > 16   < > 16 16 15   CREATININE 0.48   < > 0.52   < > 0.55   < > 0.62   < > 0.60   < > 0.9 0.9 0.7  CALCIUM 8.3*   < > 8.4*   < > 8.7*   < > 8.7*   < > 8.4*   < > 9.6 9.8 9.2  MG 1.8  --  1.8  --  2.0  --   --   --   --   --   --   --   --   PHOS 2.1*  --  3.3  --  3.0  --   --   --   --   --   --   --   --    < > = values in this interval not displayed.    Recent Labs    05/08/19 0327 05/08/19 0327 05/09/19 0338 05/09/19 0338 05/10/19 0341 05/20/19 0000 06/29/19 0000  AST 29   < > 26   < > 25 16 14   ALT 23   < > 22   < > 21 15 7   ALKPHOS 106   < > 97   < > 91 254* 129*  BILITOT 1.5*  --  1.1  --  1.4*  --   --   PROT 5.7*  --  5.6*  --  5.5*  --   --   ALBUMIN 3.1*   < >  2.9*   < > 2.9* 3.5 4.0   < > = values in this interval not displayed.   Recent Labs    05/04/19 0025 05/04/19 0449 05/11/19 0422 05/11/19 0422 05/12/19 0334 05/12/19 0334 05/13/19 0403 05/20/19 0000 06/29/19 0000  WBC 13.3*   < > 7.6   < > 7.4   < > 8.3 5.9 4.9  NEUTROABS 11.3*  --   --   --   --   --   --  4,260  --   HGB 13.6   < > 10.8*   < > 10.8*   < > 11.3* 12.1 13.3  HCT 41.6   < > 33.4*   < > 33.6*   < > 36.2 36 40  MCV 93.7   < > 95.4  --  95.2  --  97.1  --   --   PLT 198   < > 220   < > 247   < > 275 364 251   < > = values in this interval not displayed.   Lab Results  Component Value Date   TSH 1.79 06/29/2019   No results found for: HGBA1C Lab Results  Component Value Date   CHOL 249 (H) 06/27/2015   HDL 70 06/27/2015   LDLCALC 160 (H) 06/27/2015   TRIG 97 06/27/2015   CHOLHDL 3.6 06/27/2015    Significant Diagnostic Results in last 30 days:  No results found.  Assessment/Plan: Cellulitis of umbilicus Umbilical area redness, tenderness, warmth, and swelling, Hx of infected umbilical button, will treat with 7 day course of Keflex 500mg  q8hr po, Florastor bid x 7 days, apply 0.5% triamcinolone cream bid to area to reduce irritation.   Edema Trace edema BLE, continue Furosemide 20mg  qd 3x/week.   Memory disorder Functioning well in [place] [facility], continue Memantine 10mg  bid.   Slow transit constipation Stable, continue Senokot S, MiraLax.   Pain of right lower leg Pain is controlled, continue Tylenol, ambulates with walker.     Family/ staff Communication: plan of care reviewed with the patient and charge nurse.    Labs/tests ordered:  None  Time spend 40 minutes.

## 2019-07-13 NOTE — Assessment & Plan Note (Signed)
Stable, continue Senokot S, MiraLax 

## 2019-07-13 NOTE — Assessment & Plan Note (Signed)
Pain is controlled, continue Tylenol, ambulates with walker.

## 2019-07-16 ENCOUNTER — Non-Acute Institutional Stay: Payer: Medicare PPO | Admitting: Nurse Practitioner

## 2019-07-16 ENCOUNTER — Encounter: Payer: Self-pay | Admitting: Nurse Practitioner

## 2019-07-16 DIAGNOSIS — R609 Edema, unspecified: Secondary | ICD-10-CM

## 2019-07-16 DIAGNOSIS — G2581 Restless legs syndrome: Secondary | ICD-10-CM | POA: Diagnosis not present

## 2019-07-16 DIAGNOSIS — L03316 Cellulitis of umbilicus: Secondary | ICD-10-CM

## 2019-07-16 DIAGNOSIS — R413 Other amnesia: Secondary | ICD-10-CM

## 2019-07-16 DIAGNOSIS — M79661 Pain in right lower leg: Secondary | ICD-10-CM | POA: Diagnosis not present

## 2019-07-16 DIAGNOSIS — G40909 Epilepsy, unspecified, not intractable, without status epilepticus: Secondary | ICD-10-CM

## 2019-07-16 NOTE — Assessment & Plan Note (Signed)
Improving, will complete total 7 day course of Keflex started 07/13/19

## 2019-07-16 NOTE — Progress Notes (Signed)
Location:   [facility] Room Number: [id number] Place of Service:  AL[facility] [phone]) Provider: [facility] Mast NP  [name], MD  Patient Care Team: [name], MD as PCP - General (Internal Medicine) [name], MD as Consulting Physician (Ophthalmology) [name], MD as Consulting Physician (Cardiology) [name], MD as Consulting Physician (Dermatology) [name], MD as Consulting Physician (Orthopedic Surgery) [name], MD as Consulting Physician (Orthopedic Surgery) [name], MD as Consulting Physician (Orthopedic Surgery) [place] Mast, Man X, NP as Nurse Practitioner (Nurse Practitioner) [name], MD as Consulting Physician (Neurology)  Extended Emergency Contact Information Primary Emergency Contact: [name],[name] Address: [address]          [place] [address] [place] Phone: [phone] Relation: Sister Secondary Emergency Contact: [name] [place] Phone: [phone] Relation: Sister  Code Status: DNR Goals of care: Advanced Directive information Advanced Directives 07/16/2019  Does Patient Have a Medical Advance Directive? Yes  Type of Advance Directive Out of facility DNR (pink MOST or yellow form)  Does patient want to make changes to medical advance directive? No - Patient declined  Copy of [place] in Chart? -  Pre-existing out of facility DNR order (yellow form or pink MOST form) Pink MOST form placed in chart (order not valid for inpatient use);Yellow form placed in chart (order not valid for inpatient use)     Chief Complaint  Patient presents with  . Acute Visit    Restless legs    HPI:  Pt is a 86 y.o. female seen today for an acute visit for worsened restless leg symptoms 3-4 days, current on Mirapex 0.25 mg qhs, no missing dose, increased to 0.5mg  x 1wk 06/25/19. Umbilical cellulitis, improved on 7 day course of Keflex  started 07/13/19. Hx of dementia, resides in [facility], ambulates with walker, on Memantine 10mg  bid. Hx of seizures, stable, on Keppra 500mg  qhs, 250mg  qd, Lamictal 150mg  bid. Edema, trace, on Furosemide 20mg  3x/wk. OA in general, on Tylenol 650mg  qd.    Past Medical History:  Diagnosis Date  . Acute bronchitis 05/23/2011  . Acute upper respiratory infections of unspecified site 05/23/2011  . Arthritis   . Chronic airway obstruction, not elsewhere classified 05/23/2011  . Disturbance of salivary secretion 01/31/2011  . Dizziness and giddiness 01/31/2011  . Essential tremor 04/25/2014  . External hemorrhoids without mention of complication [id number]  . Gait disorder 04/25/2014  . Insomnia, unspecified 09/12/2011  . Lumbago 01/31/2011  . Major depressive disorder, single episode, unspecified 01/31/2011  . Memory disorder 04/25/2014  . Mitral valve disorders(424.0) 01/31/2011  . Other and unspecified hyperlipidemia 01/31/2011  . Other convulsions 01/31/2011  . Other emphysema ([place]) 01/31/2011  . Pain in joint, site unspecified 01/31/2011  . Restless legs syndrome (RLS) 09/12/2011  . Retinal detachment with retinal defect of right eye 2011   right eye twice  . Senile osteoporosis 01/31/2011  . Spontaneous ecchymoses 01/31/2011  . Stiffness of joints, not elsewhere classified, multiple sites 01/31/2011  . Unspecified essential hypertension 01/31/2011   Past Surgical History:  Procedure Laterality Date  . ABDOMINAL HYSTERECTOMY  06/21/2003   TAH/BSO, omenectomy PSB resect, Stg IC cystadenofibroma  . CHOLECYSTECTOMY  2005   Dr. Marlou Starks  . ELBOW SURGERY Right 2008   broken   Dr. Apolonio Schneiders  . EYE SURGERY    . RETINAL DETACHMENT SURGERY N/A    two  . REVERSE SHOULDER ARTHROPLASTY  Left 05/06/2019   Procedure: REVERSE SHOULDER ARTHROPLASTY;  Surgeon: Justice Britain, MD;  Location: [facility];  Service: Orthopedics;  Laterality: Left;  148min  . ROTATOR CUFF REPAIR Right 2012   Dr. Theda Sers  . SQUAMOUS CELL  CARCINOMA EXCISION Bilateral 2012, 8/14   [place] on legs   Dr. Sarajane Jews  . TONSILLECTOMY  1941  . VIDEO BRONCHOSCOPY WITH ENDOBRONCHIAL NAVIGATION N/A 11/29/2015   Procedure: VIDEO BRONCHOSCOPY WITH ENDOBRONCHIAL NAVIGATION;  Surgeon: Collene Gobble, MD;  Location: [facility];  Service: Thoracic;  Laterality: N/A;    Allergies  Allergen Reactions  . Latex Other (See Comments)    Swelling  . Sulfa Antibiotics Nausea And Vomiting  . Tetracyclines & Related     Allergies as of 07/16/2019      Reactions   Latex Other (See Comments)   Swelling   Sulfa Antibiotics Nausea And Vomiting   Tetracyclines & Related       Medication List       Accurate as of July 16, 2019  3:59 PM. If you have any questions, ask your nurse or doctor.        STOP taking these medications   ofloxacin 0.3 % ophthalmic solution Commonly known as: OCUFLOX Stopped by: Man X Mast, NP   ProAir HFA 108 (90 Base) MCG/ACT inhaler Generic drug: albuterol Stopped by: Man X Mast, NP     TAKE these medications   acetaminophen 325 MG tablet Commonly known as: TYLENOL Take 650 mg by mouth every 4 (four) hours as needed for mild pain.   acetaminophen 325 MG tablet Commonly known as: TYLENOL Take 650 mg by mouth daily. At 4pm.   alendronate 70 MG tablet Commonly known as: FOSAMAX Take 70 mg by mouth once a week. Take on Mondays   aspirin 81 MG tablet Take 81 mg by mouth daily.   cephALEXin 500 MG capsule Commonly known as: KEFLEX Take 500 mg by mouth every 8 (eight) hours.   cholecalciferol 1000 units tablet Commonly known as: VITAMIN D Take 1 tablet (1,000 Units total) by mouth daily.   furosemide 20 MG tablet Commonly known as: LASIX Take 20 mg by mouth every Monday, Wednesday, and Friday.   hydrocortisone 2.5 % cream Apply 1 application topically as needed (for psoriasis).   ketoconazole 2 % cream Commonly known as: NIZORAL Apply 1 application topically as needed (for psoriasis).   lamoTRIgine  150 MG tablet Commonly known as: LAMICTAL TAKE 1 TABLET BY MOUTH TWICE DAILY.   levETIRAcetam 500 MG tablet Commonly known as: KEPPRA Take 500 mg by mouth at bedtime.   levETIRAcetam 250 MG tablet Commonly known as: KEPPRA Take 250 mg by mouth daily.   memantine 10 MG tablet Commonly known as: NAMENDA TAKE 1 TABLET BY MOUTH TWICE DAILY.   polyethylene glycol 17 g packet Commonly known as: MIRALAX / GLYCOLAX Take 17 g by mouth at bedtime.   potassium chloride 10 MEQ tablet Commonly known as: KLOR-CON Take 10 mEq by mouth daily.   pramipexole 0.25 MG tablet Commonly known as: MIRAPEX Take 0.25 mg by mouth at bedtime.   saccharomyces boulardii 250 MG capsule Commonly known as: FLORASTOR Take 250 mg by mouth 2 (two) times daily.   sennosides-docusate sodium 8.6-50 MG tablet Commonly known as: SENOKOT-S Take 1 tablet by mouth in the morning and at bedtime.   simvastatin 10 MG tablet Commonly known as: ZOCOR Take 10 mg by mouth daily. Reported on 06/27/2015   tiotropium 18 MCG inhalation capsule Commonly  known as: Spiriva HandiHaler INHALE CONTENTS OF ONE CAPSULE ONCE DAILY FOR COPD.   triamcinolone cream 0.5 % Commonly known as: KENALOG Apply 1 application topically 2 (two) times daily.       Review of Systems  Constitutional: Negative for activity change and fever.  HENT: Positive for hearing loss. Negative for congestion and voice change.   Eyes: Negative for visual disturbance.       Right eye upper eyelid stye is resolving.   Respiratory: Positive for shortness of breath. Negative for cough.        DOE is chronic  Cardiovascular: Positive for leg swelling. Negative for chest pain and palpitations.  Gastrointestinal: Negative for abdominal distention and abdominal pain.  Genitourinary: Negative for difficulty urinating, dysuria and urgency.  Musculoskeletal: Positive for arthralgias and gait problem.  Skin: Positive for color change.       BLE discoloration  is chronic. Residual redness, warmth, tenderness, and swelling in umbilical area.   Neurological: Positive for seizures. Negative for dizziness, tremors, syncope, facial asymmetry, speech difficulty and headaches.       Memory lapses. Hx of seizures. Worsened restless legs in the past a few days.   Psychiatric/Behavioral: Negative for agitation, behavioral problems and sleep disturbance. The patient is not nervous/anxious.     Immunization History  Administered Date(s) Administered  . Influenza Split 01/06/2014  . Influenza Whole 01/07/2012, 01/06/2013  . Influenza, High Dose Seasonal PF 12/25/2015, 01/20/2017  . Influenza,inj,Quad PF,6+ Mos 12/21/2014  . Influenza-Unspecified 12/09/2018  . Moderna SARS-COVID-2 Vaccination 04/12/2019, 06/05/2019  . Pneumococcal Conjugate-13 02/01/2014  . Pneumococcal Polysaccharide-23 04/08/2004  . Td 04/08/2002  . Tdap 04/09/2011  . Zoster 04/08/2008  . Zoster Recombinat (Shingrix) 08/27/2017   Pertinent  Health Maintenance Due  Topic Date Due  . MAMMOGRAM  06/14/2015  . INFLUENZA VACCINE  11/07/2019  . DEXA SCAN  Completed  . PNA vac Low Risk Adult  Completed   Fall Risk  03/24/2014 10/15/2012  Falls in the past year? No No   Functional Status Survey:    Vitals:   07/16/19 1435  BP: 130/68  Pulse: 64  Resp: 18  Temp: (!) 96.8 F (36 C)  SpO2: 94%  Weight: 155 lb (70.3 kg)  Height: 5' 5.5" (1.664 m)   Body mass index is 25.4 kg/m. Physical Exam Vitals and nursing note reviewed.  Constitutional:      Appearance: Normal appearance. She is not ill-appearing.  HENT:     Head: Normocephalic and atraumatic.     Nose: Nose normal.     Mouth/Throat:     Mouth: Mucous membranes are moist.  Eyes:     General:        Right eye: Hordeolum present.     Extraocular Movements: Extraocular movements intact.     Conjunctiva/sclera:     Right eye: Right conjunctiva is not injected.     Left eye: Left conjunctiva is not injected.      Pupils: Pupils are equal, round, and reactive to light.     Comments: Mid right upper eyelid style is healing   Cardiovascular:     Rate and Rhythm: Normal rate and regular rhythm.     Heart sounds: Murmur present.     Comments: PD pulses are not felt.  Pulmonary:     Breath sounds: Rales present.     Comments: Decreased air entry to both lungs. Bibasilar rales.  Abdominal:     General: Bowel sounds are normal. There is no distension.  Palpations: Abdomen is soft.     Tenderness: There is no abdominal tenderness. There is no guarding or rebound.     Hernia: No hernia is present.     Comments: Mid abd surgical scar  Musculoskeletal:     Cervical back: Normal range of motion and neck supple.     Right lower leg: Edema present.     Left lower leg: Edema present.     Comments: Trace edema BLE.    Skin:    General: Skin is warm and dry.     Findings: Erythema present.     Comments: Brownish skin discoloration BLE. Umbilical region resolving redness, swelling,  drainage, warmth, and tenderness.    Neurological:     General: No focal deficit present.     Mental Status: She is alert and oriented to person, place, and time. Mental status is at baseline.     Motor: No weakness.     Coordination: Coordination normal.     Gait: Gait abnormal.  Psychiatric:        Mood and Affect: Mood normal.        Behavior: Behavior normal.        Thought Content: Thought content normal.        Judgment: Judgment normal.     Labs reviewed: Recent Labs    05/08/19 0327 05/08/19 0327 05/09/19 [id number] 05/09/19 [id number] 05/10/19 0341 05/10/19 0341 05/12/19 0334 05/12/19 0334 05/13/19 0403 05/20/19 0000 05/26/19 0000 06/08/19 0000 06/29/19 0000  NA 138   < > 138   < > 139   < > 139   < > 138   < > 142 142 141  K 3.6   < > 3.3*   < > 3.5   < > 3.3*   < > 3.6   < > 4.0 4.2 4.0  CL 103   < > 100   < > 104   < > 104   < > 105   < > 102 103 106  CO2 26   < > 30   < > 28   < > 25   < > 24   < > 31*  28* 27*  GLUCOSE 106*   < > 104*   < > 99  --  97  --  101*  --   --   --   --   BUN 17   < > 15   < > 13   < > 19   < > 16   < > 16 16 15   CREATININE 0.48   < > 0.52   < > 0.55   < > 0.62   < > 0.60   < > 0.9 0.9 0.7  CALCIUM 8.3*   < > 8.4*   < > 8.7*   < > 8.7*   < > 8.4*   < > 9.6 9.8 9.2  MG 1.8  --  1.8  --  2.0  --   --   --   --   --   --   --   --   PHOS 2.1*  --  3.3  --  3.0  --   --   --   --   --   --   --   --    < > = values in this interval not displayed.   Recent Labs    05/08/19 0327 05/08/19 0327 05/09/19 [id number] 05/09/19 [id number] 05/10/19 0341 05/20/19 0000  06/29/19 0000  AST 29   < > 26   < > 25 16 14   ALT 23   < > 22   < > 21 15 7   ALKPHOS 106   < > 97   < > 91 254* 129*  BILITOT 1.5*  --  1.1  --  1.4*  --   --   PROT 5.7*  --  5.6*  --  5.5*  --   --   ALBUMIN 3.1*   < > 2.9*   < > 2.9* 3.5 4.0   < > = values in this interval not displayed.   Recent Labs    05/04/19 0025 05/04/19 0449 05/11/19 0422 05/11/19 0422 05/12/19 0334 05/12/19 0334 05/13/19 0403 05/20/19 0000 06/29/19 0000  WBC 13.3*   < > 7.6   < > 7.4   < > 8.3 5.9 4.9  NEUTROABS 11.3*  --   --   --   --   --   --  4,260  --   HGB 13.6   < > 10.8*   < > 10.8*   < > 11.3* 12.1 13.3  HCT 41.6   < > 33.4*   < > 33.6*   < > 36.2 36 40  MCV 93.7   < > 95.4  --  95.2  --  97.1  --   --   PLT 198   < > 220   < > 247   < > 275 364 251   < > = values in this interval not displayed.   Lab Results  Component Value Date   TSH 1.79 06/29/2019   No results found for: HGBA1C Lab Results  Component Value Date   CHOL 249 (H) 06/27/2015   HDL 70 06/27/2015   LDLCALC 160 (H) 06/27/2015   TRIG 97 06/27/2015   CHOLHDL 3.6 06/27/2015    Significant Diagnostic Results in last 30 days:  No results found.  Assessment/Plan: Restless legs syndrome (RLS) Worsened in the past 2-3 days, trial of increased Mirapex 0.5mg  hs x 1wk 06/25/19 with improvement, will increase Mirapex to 0.5mg  qhs, observe.    Edema Trace edema BLE, continue Furosemide 3x/wk  Pain of right lower leg Pain is controlled, continue Tylenol  Memory disorder High functional in [place], continue to ambulate with walker, continue Memantine.   Cellulitis of umbilicus Improving, will complete total 7 day course of Keflex started 07/13/19  Seizure disorder (HCC) Stable, continue Lamictal, Keppra.     Family/ staff Communication: none  Labs/tests ordered: none  Time spend 40 minutes.

## 2019-07-16 NOTE — Assessment & Plan Note (Signed)
Pain is controlled, continue Tylenol.  

## 2019-07-16 NOTE — Assessment & Plan Note (Signed)
Stable, continue Lamictal, Keppra.

## 2019-07-16 NOTE — Assessment & Plan Note (Signed)
Worsened in the past 2-3 days, trial of increased Mirapex 0.5mg  hs x 1wk 06/25/19 with improvement, will increase Mirapex to 0.5mg  qhs, observe.

## 2019-07-16 NOTE — Assessment & Plan Note (Signed)
Trace edema BLE, continue Furosemide 3x/wk ?

## 2019-07-16 NOTE — Assessment & Plan Note (Signed)
High functional in [place], continue to ambulate with walker, continue Memantine.

## 2019-08-19 NOTE — Progress Notes (Deleted)
PATIENT: [name] DOB: 1932/12/16  REASON FOR VISIT: follow up HISTORY FROM: patient  HISTORY OF PRESENT ILLNESS: Today 08/19/19  [name] is a 86 year old female with history of seizures and mild memory disturbance.  She remains on Lamictal and Keppra.  She is in AL at [facility].  She ambulates with a walker.  She remains on Namenda. She had a mechanical fall in February had left proximal humeral neck fracture, had surgical repair. HISTORY 06/16/2018 SS: [name] is a 86 year old female who presents for follow-up with history of seizures, mild memory disturbance.  She is currently taking Keppra and Lamictal for seizures.  She is currently taking Namenda and her last MMSE was 30/30.  She reports overall she is doing quite well.  She reports her last seizure was years ago.  She is tolerating her medications without problem.  She lives at [facility] independent living.  She does not drive a car.  She does some light housework, light cooking but eats most meals in the dining hall.  She does manage her finances.  She is usually very active, walking 4 miles a day with a walker.  She recently had minor surgery to her right leg and is recovering from that.  She denies any falls but reports her balance has been off.  She reports she is very careful not to fall as she was told her bone density was low.  She reports her memory may be a little worse however she attributes this to being 48.  She is generally a very active lady, does activities, spends time with friends.  She does report restless legs at night and she will take Mirapex at bedtime with good benefit.  She denies any new problems or concerns today and reports overall she is doing quite well.   REVIEW OF SYSTEMS: Out of a complete 14 system review of symptoms, the patient complains only of the following symptoms, and all other reviewed systems are negative.  ALLERGIES: Allergies  Allergen Reactions  . Latex Other (See  Comments)    Swelling  . Sulfa Antibiotics Nausea And Vomiting  . Tetracyclines & Related     HOME MEDICATIONS: Outpatient Medications Prior to Visit  Medication Sig Dispense Refill  . acetaminophen (TYLENOL) 325 MG tablet Take 650 mg by mouth every 4 (four) hours as needed for mild pain.     [name] acetaminophen (TYLENOL) 325 MG tablet Take 650 mg by mouth daily. At 4pm.    . alendronate (FOSAMAX) 70 MG tablet Take 70 mg by mouth once a week. Take on Mondays    . aspirin 81 MG tablet Take 81 mg by mouth daily.    . cholecalciferol (VITAMIN D) 1000 units tablet Take 1 tablet (1,000 Units total) by mouth daily.    . furosemide (LASIX) 20 MG tablet Take 20 mg by mouth every Monday, Wednesday, and Friday.    . hydrocortisone 2.5 % cream Apply 1 application topically as needed (for psoriasis).     [name] ketoconazole (NIZORAL) 2 % cream Apply 1 application topically as needed (for psoriasis).     [name] lamoTRIgine (LAMICTAL) 150 MG tablet TAKE 1 TABLET BY MOUTH TWICE DAILY. 180 tablet 3  . levETIRAcetam (KEPPRA) 250 MG tablet Take 250 mg by mouth daily.    [name] levETIRAcetam (KEPPRA) 500 MG tablet Take 500 mg by mouth at bedtime.    . memantine (NAMENDA) 10 MG tablet TAKE 1 TABLET BY MOUTH TWICE DAILY. 180 tablet 1  .  polyethylene glycol (MIRALAX / GLYCOLAX) 17 g packet Take 17 g by mouth at bedtime.    . potassium chloride (KLOR-CON) 10 MEQ tablet Take 10 mEq by mouth daily.     . pramipexole (MIRAPEX) 0.25 MG tablet Take 0.25 mg by mouth at bedtime.    . saccharomyces boulardii (FLORASTOR) 250 MG capsule Take 250 mg by mouth 2 (two) times daily.    . sennosides-docusate sodium (SENOKOT-S) 8.6-50 MG tablet Take 1 tablet by mouth in the morning and at bedtime.     . simvastatin (ZOCOR) 10 MG tablet Take 10 mg by mouth daily. Reported on 06/27/2015    . tiotropium (SPIRIVA HANDIHALER) 18 MCG inhalation capsule INHALE CONTENTS OF ONE CAPSULE ONCE DAILY FOR COPD. 30 capsule 5  . triamcinolone cream (KENALOG) 0.5  % Apply 1 application topically 2 (two) times daily.     No facility-administered medications prior to visit.    PAST MEDICAL HISTORY: Past Medical History:  Diagnosis Date  . Acute bronchitis 05/23/2011  . Acute upper respiratory infections of unspecified site 05/23/2011  . Arthritis   . Chronic airway obstruction, not elsewhere classified 05/23/2011  . Disturbance of salivary secretion 01/31/2011  . Dizziness and giddiness 01/31/2011  . Essential tremor 04/25/2014  . External hemorrhoids without mention of complication [id number]  . Gait disorder 04/25/2014  . Insomnia, unspecified 09/12/2011  . Lumbago 01/31/2011  . Major depressive disorder, single episode, unspecified 01/31/2011  . Memory disorder 04/25/2014  . Mitral valve disorders(424.0) 01/31/2011  . Other and unspecified hyperlipidemia 01/31/2011  . Other convulsions 01/31/2011  . Other emphysema ([place]) 01/31/2011  . Pain in joint, site unspecified 01/31/2011  . Restless legs syndrome (RLS) 09/12/2011  . Retinal detachment with retinal defect of right eye 2011   right eye twice  . Senile osteoporosis 01/31/2011  . Spontaneous ecchymoses 01/31/2011  . Stiffness of joints, not elsewhere classified, multiple sites 01/31/2011  . Unspecified essential hypertension 01/31/2011    PAST SURGICAL HISTORY: Past Surgical History:  Procedure Laterality Date  . ABDOMINAL HYSTERECTOMY  06/21/2003   TAH/BSO, omenectomy PSB resect, Stg IC cystadenofibroma  . CHOLECYSTECTOMY  2005   Dr. Marlou Starks  . ELBOW SURGERY Right 2008   broken   Dr. Apolonio Schneiders  . EYE SURGERY    . RETINAL DETACHMENT SURGERY N/A    two  . REVERSE SHOULDER ARTHROPLASTY Left 05/06/2019   Procedure: REVERSE SHOULDER ARTHROPLASTY;  Surgeon: Justice Britain, MD;  Location: [facility];  Service: Orthopedics;  Laterality: Left;  154min  . ROTATOR CUFF REPAIR Right 2012   Dr. Theda Sers  . SQUAMOUS CELL CARCINOMA EXCISION Bilateral 2012, 8/14   [place] on legs   Dr. Sarajane Jews  . TONSILLECTOMY   1941  . VIDEO BRONCHOSCOPY WITH ENDOBRONCHIAL NAVIGATION N/A 11/29/2015   Procedure: VIDEO BRONCHOSCOPY WITH ENDOBRONCHIAL NAVIGATION;  Surgeon: Collene Gobble, MD;  Location: [facility] OR;  Service: Thoracic;  Laterality: N/A;    FAMILY HISTORY: Family History  Problem Relation Age of Onset  . Heart disease Father        CHF  . Cancer Mother        breast  . Seizures Sister     SOCIAL HISTORY: Social History   Socioeconomic History  . Marital status: Divorced    Spouse name: Not on file  . Number of children: 0  . Years of education: Not on file  . Highest education level: Not on file  Occupational History  . Occupation: retired Tourist information centre manager for [employer]  Tobacco Use  .  Smoking status: Former Smoker    Packs/day: 1.00    Years: 40.00    Pack years: 40.00    Types: Cigarettes    Quit date: 04/08/1988    Years since quitting: 31.3  . Smokeless tobacco: Never Used  . Tobacco comment: quit in 1990  Substance and Sexual Activity  . Alcohol use: No    Alcohol/week: 0.0 standard drinks  . Drug use: No  . Sexual activity: Never  Other Topics Concern  . Not on file  Social History Narrative   Lives at [facility]   No children   Divorced   Exercise climbs steps 4 flights daily PT for balance   Alcohol none   Stopped smoking 1989   Patient drinks 3 large sodas daily.   Patient is right handed.   Social Determinants of Health   Financial Resource Strain:   . Difficulty of Paying Living Expenses:   Food Insecurity:   . Worried About Charity fundraiser in the Last Year:   . Arboriculturist in the Last Year:   Transportation Needs:   . Film/video editor (Medical):   [name] Lack of Transportation (Non-Medical):   Physical Activity:   . Days of Exercise per Week:   . Minutes of Exercise per Session:   Stress:   . Feeling of Stress :   Social Connections:   . Frequency of Communication with Friends and Family:   . Frequency of Social Gatherings with Friends  and Family:   . Attends Religious Services:   . Active Member of Clubs or Organizations:   . Attends Archivist Meetings:   [name] Marital Status:   Intimate Partner Violence:   . Fear of Current or Ex-Partner:   . Emotionally Abused:   [name] Physically Abused:   . Sexually Abused:       PHYSICAL EXAM  There were no vitals filed for this visit. There is no height or weight on file to calculate BMI.  Generalized: Well developed, in no acute distress   Neurological examination  Mentation: Alert oriented to time, place, history taking. Follows all commands speech and language fluent Cranial nerve II-XII: Pupils were equal round reactive to light. Extraocular movements were full, visual field were full on confrontational test. Facial sensation and strength were normal. Uvula tongue midline. Head turning and shoulder shrug  were normal and symmetric. Motor: The motor testing reveals 5 over 5 strength of all 4 extremities. Good symmetric motor tone is noted throughout.  Sensory: Sensory testing is intact to soft touch on all 4 extremities. No evidence of extinction is noted.  Coordination: Cerebellar testing reveals good finger-nose-finger and heel-to-shin bilaterally.  Gait and station: Gait is normal. Tandem gait is normal. Romberg is negative. No drift is seen.  Reflexes: Deep tendon reflexes are symmetric and normal bilaterally.   DIAGNOSTIC DATA (LABS, IMAGING, TESTING) - I reviewed patient records, labs, notes, testing and imaging myself where available.  Lab Results  Component Value Date   WBC 4.9 06/29/2019   HGB 13.3 06/29/2019   HCT 40 06/29/2019   MCV 97.1 05/13/2019   PLT 251 06/29/2019      Component Value Date/Time   NA 141 06/29/2019 0000   NA 143 06/22/2014 1034   K 4.0 06/29/2019 0000   K 5.0 06/22/2014 1034   CL 106 06/29/2019 0000   CO2 27 (A) 06/29/2019 0000   CO2 28 06/22/2014 1034   GLUCOSE 101 (H) 05/13/2019 0403  GLUCOSE 82 06/22/2014 1034   BUN  15 06/29/2019 0000   BUN 29.6 (H) 06/22/2014 1034   CREATININE 0.7 06/29/2019 0000   CREATININE 0.60 05/13/2019 0403   CREATININE 1.02 (H) 06/27/2015 1055   CREATININE 0.8 06/22/2014 1034   CALCIUM 9.2 06/29/2019 0000   CALCIUM 9.7 06/22/2014 1034   PROT 5.5 (L) 05/10/2019 0341   PROT 6.8 06/16/2018 1056   ALBUMIN 4.0 06/29/2019 0000   ALBUMIN 4.5 06/16/2018 1056   AST 14 06/29/2019 0000   ALT 7 06/29/2019 0000   ALKPHOS 129 (A) 06/29/2019 0000   BILITOT 1.4 (H) 05/10/2019 0341   BILITOT 0.4 06/16/2018 1056   GFRNONAA 76 06/29/2019 0000   GFRAA 88 06/29/2019 0000   Lab Results  Component Value Date   CHOL 249 (H) 06/27/2015   HDL 70 06/27/2015   LDLCALC 160 (H) 06/27/2015   TRIG 97 06/27/2015   CHOLHDL 3.6 06/27/2015   No results found for: HGBA1C Lab Results  Component Value Date   VITAMINB12 425 04/25/2014   Lab Results  Component Value Date   TSH 1.79 06/29/2019      ASSESSMENT AND PLAN 86 y.o. year old female  has a past medical history of Acute bronchitis (05/23/2011), Acute upper respiratory infections of unspecified site (05/23/2011), Arthritis, Chronic airway obstruction, not elsewhere classified (05/23/2011), Disturbance of salivary secretion (01/31/2011), Dizziness and giddiness (01/31/2011), Essential tremor (04/25/2014), External hemorrhoids without mention of complication (A999333), Gait disorder (04/25/2014), Insomnia, unspecified (09/12/2011), Lumbago (01/31/2011), Major depressive disorder, single episode, unspecified (01/31/2011), Memory disorder (04/25/2014), Mitral valve disorders(424.0) (01/31/2011), Other and unspecified hyperlipidemia (01/31/2011), Other convulsions (01/31/2011), Other emphysema ([place]) (01/31/2011), Pain in joint, site unspecified (01/31/2011), Restless legs syndrome (RLS) (09/12/2011), Retinal detachment with retinal defect of right eye (2011), Senile osteoporosis (01/31/2011), Spontaneous ecchymoses (01/31/2011), Stiffness of joints, not  elsewhere classified, multiple sites (01/31/2011), and Unspecified essential hypertension (01/31/2011). here with ***   I spent 15 minutes with the patient. 50% of this time was spent   Butler Denmark, [place], DNP 08/19/2019, 4:03 PM [facility] [address], [place], [place] [address] [phone]

## 2019-08-23 ENCOUNTER — Ambulatory Visit: Payer: Medicare PPO | Admitting: Neurology

## 2019-09-15 ENCOUNTER — Non-Acute Institutional Stay: Payer: Medicare PPO | Admitting: Nurse Practitioner

## 2019-09-15 DIAGNOSIS — R413 Other amnesia: Secondary | ICD-10-CM

## 2019-09-15 DIAGNOSIS — G2581 Restless legs syndrome: Secondary | ICD-10-CM

## 2019-09-15 DIAGNOSIS — K5901 Slow transit constipation: Secondary | ICD-10-CM

## 2019-09-15 DIAGNOSIS — J438 Other emphysema: Secondary | ICD-10-CM

## 2019-09-15 DIAGNOSIS — R609 Edema, unspecified: Secondary | ICD-10-CM | POA: Diagnosis not present

## 2019-09-15 DIAGNOSIS — G40909 Epilepsy, unspecified, not intractable, without status epilepticus: Secondary | ICD-10-CM

## 2019-09-16 ENCOUNTER — Encounter: Payer: Self-pay | Admitting: Nurse Practitioner

## 2019-09-16 NOTE — Progress Notes (Signed)
Location:   [facility] [place] Room Number: 911 Place of Service:  ALF [phone]) Provider:  Tekeshia Klahr, ManXie NP   [name], MD  Patient Care Team: [name], MD as PCP - General (Internal Medicine) [name], MD as Consulting Physician (Ophthalmology) [name], MD as Consulting Physician (Cardiology) [name], MD as Consulting Physician (Dermatology) [name], MD as Consulting Physician (Orthopedic Surgery) [name], MD as Consulting Physician (Orthopedic Surgery) [name], MD as Consulting Physician (Orthopedic Surgery) [place], [place] Farra Nikolic X, NP as Nurse Practitioner (Nurse Practitioner) [name], MD as Consulting Physician (Neurology)  Extended Emergency Contact Information Primary Emergency Contact: [name],[name] Address: [address]          [place] [address] [place] Phone: [phone] Relation: Sister Secondary Emergency Contact: [name] [place] Phone: [phone] Relation: Sister  Code Status:  DNR Goals of care: Advanced Directive information Advanced Directives 09/16/2019  Does Patient Have a Medical Advance Directive? Yes  Type of Advance Directive Out of facility DNR (pink MOST or yellow form)  Does patient want to make changes to medical advance directive? No - Patient declined  Copy of [place] in Chart? -  Pre-existing out of facility DNR order (yellow form or pink MOST form) Yellow form placed in chart (order not valid for inpatient use);Pink MOST form placed in chart (order not valid for inpatient use)     Chief Complaint  Patient presents with  . Medical Management of Chronic Issues    HPI:  Pt is a 86 y.o. female seen today for medical management of chronic diseases.     Hx of restless leg symptoms, stable, MiraPex 0.5mg  qd. Hx of dementia, resides in [facility], ambulates with walker, on Memantine  10mg  bid. Hx of seizures, stable, on Keppra 500mg  qhs, 250mg  qd, Lamictal 150mg  bid. Edema, trace, on Furosemide 20mg  3x/wk. OA in general, on Tylenol 650mg  qd. constipation, stable, on MiraLax qd, Senokot Q qd. COPD, stable, on Spiriva qd, Albuterol HFA q4h prn.      Past Medical History:  Diagnosis Date  . Acute bronchitis 05/23/2011  . Acute upper respiratory infections of unspecified site 05/23/2011  . Arthritis   . Chronic airway obstruction, not elsewhere classified 05/23/2011  . Disturbance of salivary secretion 01/31/2011  . Dizziness and giddiness 01/31/2011  . Essential tremor 04/25/2014  . External hemorrhoids without mention of complication 00/34/9179  . Gait disorder 04/25/2014  . Insomnia, unspecified 09/12/2011  . Lumbago 01/31/2011  . Major depressive disorder, single episode, unspecified 01/31/2011  . Memory disorder 04/25/2014  . Mitral valve disorders(424.0) 01/31/2011  . Other and unspecified hyperlipidemia 01/31/2011  . Other convulsions 01/31/2011  . Other emphysema ([place]) 01/31/2011  . Pain in joint, site unspecified 01/31/2011  . Restless legs syndrome (RLS) 09/12/2011  . Retinal detachment with retinal defect of right eye 2011   right eye twice  . Senile osteoporosis 01/31/2011  . Spontaneous ecchymoses 01/31/2011  . Stiffness of joints, not elsewhere classified, multiple sites 01/31/2011  . Unspecified essential hypertension 01/31/2011   Past Surgical History:  Procedure Laterality Date  . ABDOMINAL HYSTERECTOMY  06/21/2003   TAH/BSO, omenectomy PSB resect, Stg IC cystadenofibroma  . CHOLECYSTECTOMY  2005   Dr. Marlou Starks  . ELBOW SURGERY Right 2008   broken   Dr. Apolonio Schneiders  . EYE SURGERY    . RETINAL DETACHMENT SURGERY N/A    two  .  REVERSE SHOULDER ARTHROPLASTY Left 05/06/2019   Procedure: REVERSE SHOULDER ARTHROPLASTY;  Surgeon: Justice Britain, MD;  Location: [facility];  Service: Orthopedics;  Laterality: Left;  122min  . ROTATOR CUFF REPAIR Right 2012   Dr. Theda Sers    . SQUAMOUS CELL CARCINOMA EXCISION Bilateral 2012, 8/14   [place] on legs   Dr. Sarajane Jews  . TONSILLECTOMY  1941  . VIDEO BRONCHOSCOPY WITH ENDOBRONCHIAL NAVIGATION N/A 11/29/2015   Procedure: VIDEO BRONCHOSCOPY WITH ENDOBRONCHIAL NAVIGATION;  Surgeon: Collene Gobble, MD;  Location: [facility];  Service: Thoracic;  Laterality: N/A;    Allergies  Allergen Reactions  . Latex Other (See Comments)    Swelling  . Sulfa Antibiotics Nausea And Vomiting  . Tetracyclines & Related     Allergies as of 09/15/2019      Reactions   Latex Other (See Comments)   Swelling   Sulfa Antibiotics Nausea And Vomiting   Tetracyclines & Related       Medication List       Accurate as of September 15, 2019 11:59 PM. If you have any questions, ask your nurse or doctor.        STOP taking these medications   alendronate 70 MG tablet Commonly known as: FOSAMAX   saccharomyces boulardii 250 MG capsule Commonly known as: FLORASTOR   sennosides-docusate sodium 8.6-50 MG tablet Commonly known as: SENOKOT-S   triamcinolone cream 0.5 % Commonly known as: KENALOG     TAKE these medications   acetaminophen 325 MG tablet Commonly known as: TYLENOL Take 650 mg by mouth every 4 (four) hours as needed for mild pain.   acetaminophen 325 MG tablet Commonly known as: TYLENOL Take 650 mg by mouth daily. At 4pm.   albuterol 108 (90 Base) MCG/ACT inhaler Commonly known as: VENTOLIN HFA Inhale 2 puffs into the lungs every 4 (four) hours as needed for wheezing or shortness of breath.   aspirin 81 MG tablet Take 81 mg by mouth daily.   cholecalciferol 1000 units tablet Commonly known as: VITAMIN D Take 1 tablet (1,000 Units total) by mouth daily.   furosemide 20 MG tablet Commonly known as: LASIX Take 20 mg by mouth every Monday, Wednesday, and Friday.   hydrocortisone 2.5 % cream Apply 1 application topically as needed (for psoriasis).   ketoconazole 2 % cream Commonly known as: NIZORAL Apply 1 application  topically as needed (for psoriasis).   lamoTRIgine 150 MG tablet Commonly known as: LAMICTAL TAKE 1 TABLET BY MOUTH TWICE DAILY.   levETIRAcetam 500 MG tablet Commonly known as: KEPPRA Take 500 mg by mouth at bedtime.   levETIRAcetam 250 MG tablet Commonly known as: KEPPRA Take 250 mg by mouth daily.   memantine 10 MG tablet Commonly known as: NAMENDA TAKE 1 TABLET BY MOUTH TWICE DAILY.   polyethylene glycol 17 g packet Commonly known as: MIRALAX / GLYCOLAX Take 17 g by mouth at bedtime.   potassium chloride 10 MEQ tablet Commonly known as: KLOR-CON Take 10 mEq by mouth daily.   pramipexole 0.5 MG tablet Commonly known as: MIRAPEX Take 0.5 mg by mouth at bedtime.   senna 8.6 MG tablet Commonly known as: SENOKOT Take 1 tablet by mouth daily.   simvastatin 10 MG tablet Commonly known as: ZOCOR Take 10 mg by mouth daily. Reported on 06/27/2015   tiotropium 18 MCG inhalation capsule Commonly known as: Spiriva HandiHaler INHALE CONTENTS OF ONE CAPSULE ONCE DAILY FOR COPD.       Review of Systems  Constitutional: Negative  for fatigue, fever and unexpected weight change.  HENT: Positive for hearing loss. Negative for congestion and voice change.   Eyes: Negative for visual disturbance.  Respiratory: Positive for shortness of breath. Negative for cough and wheezing.        DOE is chronic  Cardiovascular: Positive for leg swelling. Negative for chest pain and palpitations.  Gastrointestinal: Negative for abdominal distention and abdominal pain.  Genitourinary: Negative for difficulty urinating, dysuria and urgency.  Musculoskeletal: Positive for arthralgias and gait problem.  Skin: Negative for color change.       BLE discoloration is chronic.  Neurological: Positive for seizures. Negative for tremors, speech difficulty and light-headedness.       Memory lapses. Hx of seizures. Worsened restless legs in the past a few days.   Psychiatric/Behavioral: Negative for  agitation, behavioral problems and sleep disturbance. The patient is not nervous/anxious.     Immunization History  Administered Date(s) Administered  . Influenza Split 01/06/2014  . Influenza Whole 01/07/2012, 01/06/2013  . Influenza, High Dose Seasonal PF 12/25/2015, 01/20/2017  . Influenza,inj,Quad PF,6+ Mos 12/21/2014  . Influenza-Unspecified 12/09/2018  . Moderna SARS-COVID-2 Vaccination 04/12/2019, 06/05/2019  . Pneumococcal Conjugate-13 02/01/2014  . Pneumococcal Polysaccharide-23 04/08/2004  . Td 04/08/2002  . Tdap 04/09/2011  . Zoster 04/08/2008  . Zoster Recombinat (Shingrix) 08/27/2017   Pertinent  Health Maintenance Due  Topic Date Due  . MAMMOGRAM  06/14/2015  . INFLUENZA VACCINE  11/07/2019  . DEXA SCAN  Completed  . PNA vac Low Risk Adult  Completed   Fall Risk  03/24/2014 10/15/2012  Falls in the past year? No No   Functional Status Survey:    Vitals:   09/16/19 0956  BP: 118/72  Pulse: 84  Resp: 20  Temp: (!) 97.5 F (36.4 C)  SpO2: 94%  Weight: 160 lb (72.6 kg)  Height: 5' 5.5" (1.664 m)   Body mass index is 26.22 kg/m. Physical Exam Vitals and nursing note reviewed.  Constitutional:      Appearance: Normal appearance.  HENT:     Head: Normocephalic and atraumatic.     Mouth/Throat:     Mouth: Mucous membranes are moist.  Eyes:     General:        Right eye: Hordeolum present.     Extraocular Movements: Extraocular movements intact.     Conjunctiva/sclera:     Right eye: Right conjunctiva is not injected.     Left eye: Left conjunctiva is not injected.     Pupils: Pupils are equal, round, and reactive to light.     Comments: Mid right upper eyelid style is healing   Cardiovascular:     Rate and Rhythm: Normal rate and regular rhythm.     Heart sounds: Murmur heard.      Comments: PD pulses are not felt.  Pulmonary:     Breath sounds: Rales present.     Comments: Decreased air entry to both lungs. Bibasilar rales.  Abdominal:      General: Bowel sounds are normal.     Palpations: Abdomen is soft.     Tenderness: There is no abdominal tenderness.     Comments: Mid abd surgical scar  Musculoskeletal:     Cervical back: Normal range of motion and neck supple.     Right lower leg: Edema present.     Left lower leg: Edema present.     Comments: Trace edema BLE.  Decreased overhead ROM of the left shoulder.   Skin:  General: Skin is warm and dry.     Comments: Brownish skin discoloration BLE.  Neurological:     General: No focal deficit present.     Mental Status: She is alert and oriented to person, place, and time. Mental status is at baseline.     Gait: Gait abnormal.  Psychiatric:        Mood and Affect: Mood normal.        Behavior: Behavior normal.        Thought Content: Thought content normal.        Judgment: Judgment normal.     Labs reviewed: Recent Labs    05/08/19 0327 05/08/19 0327 05/09/19 7846 05/09/19 9629 05/10/19 0341 05/10/19 0341 05/12/19 0334 05/12/19 0334 05/13/19 0403 05/20/19 0000 05/26/19 0000 06/08/19 0000 06/29/19 0000  NA 138   < > 138   < > 139   < > 139   < > 138   < > 142 142 141  K 3.6   < > 3.3*   < > 3.5   < > 3.3*   < > 3.6   < > 4.0 4.2 4.0  CL 103   < > 100   < > 104   < > 104   < > 105   < > 102 103 106  CO2 26   < > 30   < > 28   < > 25   < > 24   < > 31* 28* 27*  GLUCOSE 106*   < > 104*   < > 99  --  97  --  101*  --   --   --   --   BUN 17   < > 15   < > 13   < > 19   < > 16   < > 16 16 15   CREATININE 0.48   < > 0.52   < > 0.55   < > 0.62   < > 0.60   < > 0.9 0.9 0.7  CALCIUM 8.3*   < > 8.4*   < > 8.7*   < > 8.7*   < > 8.4*   < > 9.6 9.8 9.2  MG 1.8  --  1.8  --  2.0  --   --   --   --   --   --   --   --   PHOS 2.1*  --  3.3  --  3.0  --   --   --   --   --   --   --   --    < > = values in this interval not displayed.   Recent Labs    05/08/19 0327 05/08/19 0327 05/09/19 0338 05/09/19 0338 05/10/19 0341 05/20/19 0000 06/29/19 0000  AST 29   <  > 26   < > 25 16 14   ALT 23   < > 22   < > 21 15 7   ALKPHOS 106   < > 97   < > 91 254* 129*  BILITOT 1.5*  --  1.1  --  1.4*  --   --   PROT 5.7*  --  5.6*  --  5.5*  --   --   ALBUMIN 3.1*   < > 2.9*   < > 2.9* 3.5 4.0   < > = values in this interval not displayed.   Recent Labs    05/04/19 0025 05/04/19 0449 05/11/19  0422 05/11/19 0422 05/12/19 0334 05/12/19 0334 05/13/19 0403 05/20/19 0000 06/29/19 0000  WBC 13.3*   < > 7.6   < > 7.4   < > 8.3 5.9 4.9  NEUTROABS 11.3*  --   --   --   --   --   --  4,260  --   HGB 13.6   < > 10.8*   < > 10.8*   < > 11.3* 12.1 13.3  HCT 41.6   < > 33.4*   < > 33.6*   < > 36.2 36 40  MCV 93.7   < > 95.4  --  95.2  --  97.1  --   --   PLT 198   < > 220   < > 247   < > 275 364 251   < > = values in this interval not displayed.   Lab Results  Component Value Date   TSH 1.79 06/29/2019   No results found for: HGBA1C Lab Results  Component Value Date   CHOL 249 (H) 06/27/2015   HDL 70 06/27/2015   LDLCALC 160 (H) 06/27/2015   TRIG 97 06/27/2015   CHOLHDL 3.6 06/27/2015    Significant Diagnostic Results in last 30 days:  No results found.  Assessment/Plan Restless legs syndrome (RLS) Stable, continue MiraPex 0.5mg  qd  COPD (chronic obstructive pulmonary disease) (HCC) Stable, continue Spiriva qd, prn Albuterol HFA q4h  Slow transit constipation Stable, continue MiraLax qd, Senokot qd.   Seizure disorder (HCC) Stable, continue Keppra 500mg  qd, 250mg  qd, Lamictal 150mg  bid.   Edema Trace edema BLE, continue Furosemide 3x/wk.   Memory disorder Functioning well in [facility], continue Memantine.      Family/ staff Communication: plan of care reviewed with the patient and charge nurse.   Labs/tests ordered:  none  Time spend 40 minutes.

## 2019-09-20 ENCOUNTER — Encounter: Payer: Self-pay | Admitting: Nurse Practitioner

## 2019-09-20 NOTE — Assessment & Plan Note (Signed)
Stable, continue Spiriva qd, prn Albuterol HFA q4h

## 2019-09-20 NOTE — Assessment & Plan Note (Signed)
Stable, continue MiraPex 0.5mg  qd

## 2019-09-20 NOTE — Assessment & Plan Note (Signed)
Stable, continue MiraLax qd, Senokot qd.

## 2019-09-20 NOTE — Assessment & Plan Note (Signed)
Stable, continue Keppra 500mg  qd, 250mg  qd, Lamictal 150mg  bid.

## 2019-09-20 NOTE — Assessment & Plan Note (Signed)
Trace edema BLE, continue Furosemide 3x/wk ?

## 2019-09-20 NOTE — Assessment & Plan Note (Signed)
Functioning well in [facility], continue Memantine.

## 2019-10-04 ENCOUNTER — Non-Acute Institutional Stay: Payer: Medicare PPO | Admitting: Nurse Practitioner

## 2019-10-04 ENCOUNTER — Encounter: Payer: Self-pay | Admitting: Nurse Practitioner

## 2019-10-04 DIAGNOSIS — R413 Other amnesia: Secondary | ICD-10-CM | POA: Diagnosis not present

## 2019-10-04 DIAGNOSIS — M47815 Spondylosis without myelopathy or radiculopathy, thoracolumbar region: Secondary | ICD-10-CM | POA: Diagnosis not present

## 2019-10-04 DIAGNOSIS — M25421 Effusion, right elbow: Secondary | ICD-10-CM | POA: Diagnosis not present

## 2019-10-04 DIAGNOSIS — R609 Edema, unspecified: Secondary | ICD-10-CM

## 2019-10-04 DIAGNOSIS — G2581 Restless legs syndrome: Secondary | ICD-10-CM | POA: Diagnosis not present

## 2019-10-04 DIAGNOSIS — G40909 Epilepsy, unspecified, not intractable, without status epilepticus: Secondary | ICD-10-CM

## 2019-10-04 DIAGNOSIS — M7021 Olecranon bursitis, right elbow: Secondary | ICD-10-CM | POA: Insufficient documentation

## 2019-10-04 NOTE — Assessment & Plan Note (Addendum)
Therapy, right elbow protector. [facility], possible aspiration if worsens.

## 2019-10-04 NOTE — Progress Notes (Addendum)
Location:   [facility] Room Number: 911 Place of Service:  ALF [phone]) Provider: Lennie Odor Daved Mcfann NP  [name], MD  Patient Care Team: [name], MD as PCP - General (Internal Medicine) [name], MD as Consulting Physician (Ophthalmology) [name], MD as Consulting Physician (Cardiology) [name], MD as Consulting Physician (Dermatology) [name], MD as Consulting Physician (Orthopedic Surgery) [name], MD as Consulting Physician (Orthopedic Surgery) [name], MD as Consulting Physician (Orthopedic Surgery) [place], [place] Luse X, NP as Nurse Practitioner (Nurse Practitioner) [name], MD as Consulting Physician (Neurology)  Extended Emergency Contact Information Primary Emergency Contact: [name],[name] Address: [address]          [place] [address] [place] Phone: [phone] Relation: Sister Secondary Emergency Contact: [name] [place] Phone: [phone] Relation: Sister  Code Status: DNR Goals of care: Advanced Directive information Advanced Directives 09/16/2019  Does Patient Have a Medical Advance Directive? Yes  Type of Advance Directive Out of facility DNR (pink MOST or yellow form)  Does patient want to make changes to medical advance directive? No - Patient declined  Copy of [place] in Chart? -  Pre-existing out of facility DNR order (yellow form or pink MOST form) Yellow form placed in chart (order not valid for inpatient use);Pink MOST form placed in chart (order not valid for inpatient use)     Chief Complaint  Patient presents with  . Acute Visit    right elbow swelling    HPI:  Pt is a 86 y.o. female seen today for an acute visit for right elbow swelling at the tip of the elbow, no pain or decreased ROM, denied trauma or injury.   Restless leg symptom, takes MiraPex 0.5mg  hs   Hx of  dementia, resides in [facility], ambulates with walker, on Memantine 10mg  bid.   Hx of seizures, stable, on Keppra 500mg  qhs, 250mg  qd, Lamictal 150mg  bid.   Edema, trace, on Furosemide 20mg  3x/wk.   OA in general, on Tylenol 650mg  qd.       Past Medical History:  Diagnosis Date  . Acute bronchitis 05/23/2011  . Acute upper respiratory infections of unspecified site 05/23/2011  . Arthritis   . Chronic airway obstruction, not elsewhere classified 05/23/2011  . Disturbance of salivary secretion 01/31/2011  . Dizziness and giddiness 01/31/2011  . Essential tremor 04/25/2014  . External hemorrhoids without mention of complication 46/96/2952  . Gait disorder 04/25/2014  . Insomnia, unspecified 09/12/2011  . Lumbago 01/31/2011  . Major depressive disorder, single episode, unspecified 01/31/2011  . Memory disorder 04/25/2014  . Mitral valve disorders(424.0) 01/31/2011  . Other and unspecified hyperlipidemia 01/31/2011  . Other convulsions 01/31/2011  . Other emphysema ([place]) 01/31/2011  . Pain in joint, site unspecified 01/31/2011  . Restless legs syndrome (RLS) 09/12/2011  . Retinal detachment with retinal defect of right eye 2011   right eye twice  . Senile osteoporosis 01/31/2011  . Spontaneous ecchymoses 01/31/2011  . Stiffness of joints, not elsewhere classified, multiple sites 01/31/2011  . Unspecified essential hypertension 01/31/2011   Past Surgical History:  Procedure Laterality Date  . ABDOMINAL HYSTERECTOMY  06/21/2003   TAH/BSO, omenectomy PSB resect, Stg IC cystadenofibroma  . CHOLECYSTECTOMY  2005   Dr. Marlou Starks  . ELBOW SURGERY Right 2008   broken   Dr. Apolonio Schneiders  . EYE SURGERY    . RETINAL DETACHMENT SURGERY N/A  two  . REVERSE SHOULDER ARTHROPLASTY Left 05/06/2019   Procedure: REVERSE SHOULDER ARTHROPLASTY;  Surgeon: Justice Britain, MD;  Location: [facility];  Service: Orthopedics;  Laterality: Left;  142min  . ROTATOR CUFF REPAIR Right 2012   Dr. Theda Sers  . SQUAMOUS CELL CARCINOMA  EXCISION Bilateral 2012, 8/14   [place] on legs   Dr. Sarajane Jews  . TONSILLECTOMY  1941  . VIDEO BRONCHOSCOPY WITH ENDOBRONCHIAL NAVIGATION N/A 11/29/2015   Procedure: VIDEO BRONCHOSCOPY WITH ENDOBRONCHIAL NAVIGATION;  Surgeon: Collene Gobble, MD;  Location: [facility];  Service: Thoracic;  Laterality: N/A;    Allergies  Allergen Reactions  . Latex Other (See Comments)    Swelling  . Sulfa Antibiotics Nausea And Vomiting  . Tetracyclines & Related     Allergies as of 10/04/2019      Reactions   Latex Other (See Comments)   Swelling   Sulfa Antibiotics Nausea And Vomiting   Tetracyclines & Related       Medication List       Accurate as of October 04, 2019 11:59 PM. If you have any questions, ask your nurse or doctor.        acetaminophen 325 MG tablet Commonly known as: TYLENOL Take 650 mg by mouth every 4 (four) hours as needed for mild pain.   acetaminophen 325 MG tablet Commonly known as: TYLENOL Take 650 mg by mouth daily. At 4pm.   albuterol 108 (90 Base) MCG/ACT inhaler Commonly known as: VENTOLIN HFA Inhale 2 puffs into the lungs every 4 (four) hours as needed for wheezing or shortness of breath.   aspirin 81 MG tablet Take 81 mg by mouth daily.   cholecalciferol 1000 units tablet Commonly known as: VITAMIN D Take 1 tablet (1,000 Units total) by mouth daily.   furosemide 20 MG tablet Commonly known as: LASIX Take 20 mg by mouth every Monday, Wednesday, and Friday.   hydrocortisone 2.5 % cream Apply 1 application topically as needed (for psoriasis).   ketoconazole 2 % cream Commonly known as: NIZORAL Apply 1 application topically as needed (for psoriasis).   lamoTRIgine 150 MG tablet Commonly known as: LAMICTAL TAKE 1 TABLET BY MOUTH TWICE DAILY.   levETIRAcetam 500 MG tablet Commonly known as: KEPPRA Take 500 mg by mouth at bedtime.   levETIRAcetam 250 MG tablet Commonly known as: KEPPRA Take 250 mg by mouth daily.   memantine 10 MG tablet Commonly  known as: NAMENDA TAKE 1 TABLET BY MOUTH TWICE DAILY.   polyethylene glycol 17 g packet Commonly known as: MIRALAX / GLYCOLAX Take 17 g by mouth at bedtime.   potassium chloride 10 MEQ tablet Commonly known as: KLOR-CON Take 10 mEq by mouth daily.   pramipexole 0.5 MG tablet Commonly known as: MIRAPEX Take 0.5 mg by mouth at bedtime.   senna 8.6 MG tablet Commonly known as: SENOKOT Take 1 tablet by mouth daily.   simvastatin 10 MG tablet Commonly known as: ZOCOR Take 10 mg by mouth daily. Reported on 06/27/2015   tiotropium 18 MCG inhalation capsule Commonly known as: Spiriva HandiHaler INHALE CONTENTS OF ONE CAPSULE ONCE DAILY FOR COPD.       Review of Systems  Constitutional: Negative for activity change, appetite change and fever.  HENT: Positive for hearing loss. Negative for congestion and voice change.   Eyes: Negative for visual disturbance.  Respiratory: Positive for shortness of breath. Negative for cough and wheezing.        DOE is chronic  Cardiovascular: Positive for leg  swelling. Negative for chest pain and palpitations.  Gastrointestinal: Negative for abdominal distention and abdominal pain.  Genitourinary: Negative for difficulty urinating, dysuria and urgency.  Musculoskeletal: Positive for arthralgias and gait problem.       Tip of the right elbow swelling.   Skin: Negative for color change.       BLE discoloration is chronic.  Neurological: Positive for seizures. Negative for tremors, speech difficulty and light-headedness.       Memory lapses. Hx of seizures. RLS  Psychiatric/Behavioral: Negative for behavioral problems and sleep disturbance. The patient is not nervous/anxious.     Immunization History  Administered Date(s) Administered  . Influenza Split 01/06/2014  . Influenza Whole 01/07/2012, 01/06/2013  . Influenza, High Dose Seasonal PF 12/25/2015, 01/20/2017  . Influenza,inj,Quad PF,6+ Mos 12/21/2014  . Influenza-Unspecified 12/09/2018    . Moderna SARS-COVID-2 Vaccination 04/12/2019, 06/05/2019  . Pneumococcal Conjugate-13 02/01/2014  . Pneumococcal Polysaccharide-23 04/08/2004  . Td 04/08/2002  . Tdap 04/09/2011  . Zoster 04/08/2008  . Zoster Recombinat (Shingrix) 08/27/2017   Pertinent  Health Maintenance Due  Topic Date Due  . MAMMOGRAM  06/14/2015  . INFLUENZA VACCINE  11/07/2019  . DEXA SCAN  Completed  . PNA vac Low Risk Adult  Completed   Fall Risk  03/24/2014 10/15/2012  Falls in the past year? No No   Functional Status Survey:    Vitals:   10/04/19 1643  BP: 120/72  Pulse: 78  Resp: 20  Temp: 98.4 F (36.9 C)  SpO2: 95%  Weight: 160 lb (72.6 kg)  Height: 5' 5.5" (1.664 m)   Body mass index is 26.22 kg/m. Physical Exam Vitals and nursing note reviewed.  Constitutional:      Appearance: Normal appearance.  HENT:     Head: Normocephalic and atraumatic.     Mouth/Throat:     Mouth: Mucous membranes are moist.  Eyes:     General:        Right eye: Hordeolum present.     Extraocular Movements: Extraocular movements intact.     Conjunctiva/sclera:     Right eye: Right conjunctiva is not injected.     Left eye: Left conjunctiva is not injected.     Pupils: Pupils are equal, round, and reactive to light.     Comments: Mid right upper eyelid style is healing   Cardiovascular:     Rate and Rhythm: Normal rate and regular rhythm.     Heart sounds: Murmur heard.      Comments: PD pulses are not felt.  Pulmonary:     Breath sounds: Rales present.     Comments: Decreased air entry to both lungs. Bibasilar rales.  Abdominal:     General: Bowel sounds are normal.     Palpations: Abdomen is soft.     Tenderness: There is no abdominal tenderness.     Comments: Mid abd surgical scar  Musculoskeletal:     Cervical back: Normal range of motion and neck supple.     Right lower leg: Edema present.     Left lower leg: Edema present.     Comments: Trace edema BLE.  Decreased overhead ROM of the  left shoulder. Right elbow olecranon bursa  effusion  Skin:    General: Skin is warm and dry.     Comments: Brownish skin discoloration BLE.  Neurological:     General: No focal deficit present.     Mental Status: She is alert and oriented to person, place, and time. Mental status  is at baseline.     Motor: No weakness.     Coordination: Coordination normal.     Gait: Gait abnormal.  Psychiatric:        Mood and Affect: Mood normal.        Behavior: Behavior normal.        Thought Content: Thought content normal.        Judgment: Judgment normal.     Labs reviewed: Recent Labs    05/08/19 0327 05/08/19 0327 05/09/19 1194 05/09/19 1740 05/10/19 0341 05/10/19 0341 05/12/19 0334 05/12/19 0334 05/13/19 0403 05/20/19 0000 05/26/19 0000 06/08/19 0000 06/29/19 0000  NA 138   < > 138   < > 139   < > 139   < > 138   < > 142 142 141  K 3.6   < > 3.3*   < > 3.5   < > 3.3*   < > 3.6   < > 4.0 4.2 4.0  CL 103   < > 100   < > 104   < > 104   < > 105   < > 102 103 106  CO2 26   < > 30   < > 28   < > 25   < > 24   < > 31* 28* 27*  GLUCOSE 106*   < > 104*   < > 99  --  97  --  101*  --   --   --   --   BUN 17   < > 15   < > 13   < > 19   < > 16   < > 16 16 15   CREATININE 0.48   < > 0.52   < > 0.55   < > 0.62   < > 0.60   < > 0.9 0.9 0.7  CALCIUM 8.3*   < > 8.4*   < > 8.7*   < > 8.7*   < > 8.4*   < > 9.6 9.8 9.2  MG 1.8  --  1.8  --  2.0  --   --   --   --   --   --   --   --   PHOS 2.1*  --  3.3  --  3.0  --   --   --   --   --   --   --   --    < > = values in this interval not displayed.   Recent Labs    05/08/19 0327 05/08/19 0327 05/09/19 0338 05/09/19 0338 05/10/19 0341 05/20/19 0000 06/29/19 0000  AST 29   < > 26   < > 25 16 14   ALT 23   < > 22   < > 21 15 7   ALKPHOS 106   < > 97   < > 91 254* 129*  BILITOT 1.5*  --  1.1  --  1.4*  --   --   PROT 5.7*  --  5.6*  --  5.5*  --   --   ALBUMIN 3.1*   < > 2.9*   < > 2.9* 3.5 4.0   < > = values in this interval not  displayed.   Recent Labs    05/04/19 0025 05/04/19 0449 05/11/19 0422 05/11/19 0422 05/12/19 0334 05/12/19 0334 05/13/19 0403 05/20/19 0000 06/29/19 0000  WBC 13.3*   < > 7.6   < > 7.4   < >  8.3 5.9 4.9  NEUTROABS 11.3*  --   --   --   --   --   --  4,260  --   HGB 13.6   < > 10.8*   < > 10.8*   < > 11.3* 12.1 13.3  HCT 41.6   < > 33.4*   < > 33.6*   < > 36.2 36 40  MCV 93.7   < > 95.4  --  95.2  --  97.1  --   --   PLT 198   < > 220   < > 247   < > 275 364 251   < > = values in this interval not displayed.   Lab Results  Component Value Date   TSH 1.79 06/29/2019   No results found for: HGBA1C Lab Results  Component Value Date   CHOL 249 (H) 06/27/2015   HDL 70 06/27/2015   LDLCALC 160 (H) 06/27/2015   TRIG 97 06/27/2015   CHOLHDL 3.6 06/27/2015    Significant Diagnostic Results in last 30 days:  No results found.  Assessment/Plan: Effusion of olecranon bursa, right Therapy, right elbow protector. May Ortho, possible aspiration if worsens.   Seizure disorder (HCC) Stable, continue Keppra 500mg  qhs, 250mg  qd, Lamictal 150mg  bid.   Edema Minimal, continue Furosemide 20mg  qd 3x/wk  Memory disorder Functioning well in [facility], continue Memantine for memory.   Restless legs syndrome (RLS) Better since MiraPex was increased to 0.5mg  qhs.   Osteoarthritis of back Kyphoscoliosis, chronic aches/pain in mid to lower back, continue Tylenol.     Family/ staff Communication: plan of care reviewed with the patient and charge nurse.   Labs/tests ordered:  none  Time spend 40 minutes.

## 2019-10-05 ENCOUNTER — Encounter: Payer: Self-pay | Admitting: Nurse Practitioner

## 2019-10-05 DIAGNOSIS — M159 Polyosteoarthritis, unspecified: Secondary | ICD-10-CM | POA: Insufficient documentation

## 2019-10-05 NOTE — Assessment & Plan Note (Signed)
Minimal, continue Furosemide 20mg  qd 3x/wk

## 2019-10-05 NOTE — Assessment & Plan Note (Signed)
Better since MiraPex was increased to 0.5mg  qhs.

## 2019-10-05 NOTE — Assessment & Plan Note (Signed)
Stable, continue Keppra 500mg  qhs, 250mg  qd, Lamictal 150mg  bid.

## 2019-10-05 NOTE — Assessment & Plan Note (Signed)
Kyphoscoliosis, chronic aches/pain in mid to lower back, continue Tylenol.

## 2019-10-05 NOTE — Assessment & Plan Note (Signed)
Functioning well in [facility], continue Memantine for memory.

## 2019-10-12 DIAGNOSIS — I10 Essential (primary) hypertension: Secondary | ICD-10-CM | POA: Diagnosis not present

## 2019-10-12 LAB — LIPID PANEL
Cholesterol: 164 (ref 0–200)
HDL: 67 (ref 35–70)
LDL Cholesterol: 82
LDl/HDL Ratio: 2.4
Triglycerides: 70 (ref 40–160)

## 2019-10-19 ENCOUNTER — Encounter: Payer: Self-pay | Admitting: Internal Medicine

## 2019-10-19 ENCOUNTER — Non-Acute Institutional Stay: Payer: Medicare PPO | Admitting: Internal Medicine

## 2019-10-19 DIAGNOSIS — G40909 Epilepsy, unspecified, not intractable, without status epilepticus: Secondary | ICD-10-CM | POA: Diagnosis not present

## 2019-10-19 DIAGNOSIS — R413 Other amnesia: Secondary | ICD-10-CM

## 2019-10-19 DIAGNOSIS — R600 Localized edema: Secondary | ICD-10-CM | POA: Diagnosis not present

## 2019-10-19 DIAGNOSIS — I10 Essential (primary) hypertension: Secondary | ICD-10-CM | POA: Diagnosis not present

## 2019-10-19 NOTE — Progress Notes (Signed)
Location:   [facility] Room Number: [place] of Service:  ALF [phone]) Provider:  Veleta Miners MD   [name], MD  Patient Care Team: [name], MD as PCP - General (Internal Medicine) [name], MD as Consulting Physician (Ophthalmology) [name], MD as Consulting Physician (Cardiology) [name], MD as Consulting Physician (Dermatology) [name], MD as Consulting Physician (Orthopedic Surgery) [name], MD as Consulting Physician (Orthopedic Surgery) [name], MD as Consulting Physician (Orthopedic Surgery) [place], [place] Mast, Man X, NP as Nurse Practitioner (Nurse Practitioner) [name], MD as Consulting Physician (Neurology)  Extended Emergency Contact Information Primary Emergency Contact: [name],[name] Address: [address]          [place] [address] [place] Phone: [phone] Relation: Sister Secondary Emergency Contact: [name] [place] Phone: [phone] Relation: Sister  Code Status:  DNR Goals of care: Advanced Directive information Advanced Directives 09/16/2019  Does Patient Have a Medical Advance Directive? Yes  Type of Advance Directive Out of facility DNR (pink MOST or yellow form)  Does patient want to make changes to medical advance directive? No - Patient declined  Copy of [place] in Chart? -  Pre-existing out of facility DNR order (yellow form or pink MOST form) Yellow form placed in chart (order not valid for inpatient use);Pink MOST form placed in chart (order not valid for inpatient use)     Chief Complaint  Patient presents with  . Acute Visit    Acute for lower extremity edema    HPI:  Pt is a 86 y.o. female seen today for an acute visit for worsening lower extremity edema with clear discharge from her right lower leg  Patientalsohas h/o COPD, Hypertension,Seizure Disorder,Mild  Cognitive impairment, Hyperlipidemia and fall in 1/21 leading to Left Humerus Fracture with Displacement needing Arthroplasty , Right Mandibular Fracture, Pneumonia and Urinary retention  Since then she has been in AL  Does have h/o LE edema Was started on Lasix 3/week during that time But recently has noticed increased swelling in both legs.  And today the nurse noticed that her pants were wet on the right side she was leaking clear discharge from her right leg. No redness pain.  Denies any cough.  Does have shortness of breath at her baseline. Patient is independent in her ADLs walks with a walker.  Her weight is up 3 pounds   Past Medical History:  Diagnosis Date  . Acute bronchitis 05/23/2011  . Acute upper respiratory infections of unspecified site 05/23/2011  . Arthritis   . Chronic airway obstruction, not elsewhere classified 05/23/2011  . Disturbance of salivary secretion 01/31/2011  . Dizziness and giddiness 01/31/2011  . Essential tremor 04/25/2014  . External hemorrhoids without mention of complication 85/27/7824  . Gait disorder 04/25/2014  . Insomnia, unspecified 09/12/2011  . Lumbago 01/31/2011  . Major depressive disorder, single episode, unspecified 01/31/2011  . Memory disorder 04/25/2014  . Mitral valve disorders(424.0) 01/31/2011  . Other and unspecified hyperlipidemia 01/31/2011  . Other convulsions 01/31/2011  . Other emphysema ([place]) 01/31/2011  . Pain in joint, site unspecified 01/31/2011  . Restless legs syndrome (RLS) 09/12/2011  . Retinal detachment with retinal defect of right eye 2011   right eye twice  . Senile osteoporosis 01/31/2011  . Spontaneous ecchymoses 01/31/2011  . Stiffness of joints, not elsewhere classified, multiple sites 01/31/2011  . Unspecified essential hypertension 01/31/2011   Past  Surgical History:  Procedure Laterality Date  . ABDOMINAL HYSTERECTOMY  06/21/2003   TAH/BSO, omenectomy PSB resect, Stg IC cystadenofibroma  .  CHOLECYSTECTOMY  2005   Dr. Marlou Starks  . ELBOW SURGERY Right 2008   broken   Dr. Apolonio Schneiders  . EYE SURGERY    . RETINAL DETACHMENT SURGERY N/A    two  . REVERSE SHOULDER ARTHROPLASTY Left 05/06/2019   Procedure: REVERSE SHOULDER ARTHROPLASTY;  Surgeon: Justice Britain, MD;  Location: [facility];  Service: Orthopedics;  Laterality: Left;  197min  . ROTATOR CUFF REPAIR Right 2012   Dr. Theda Sers  . SQUAMOUS CELL CARCINOMA EXCISION Bilateral 2012, 8/14   [place] on legs   Dr. Sarajane Jews  . TONSILLECTOMY  1941  . VIDEO BRONCHOSCOPY WITH ENDOBRONCHIAL NAVIGATION N/A 11/29/2015   Procedure: VIDEO BRONCHOSCOPY WITH ENDOBRONCHIAL NAVIGATION;  Surgeon: Collene Gobble, MD;  Location: [facility];  Service: Thoracic;  Laterality: N/A;    Allergies  Allergen Reactions  . Latex Other (See Comments)    Swelling  . Sulfa Antibiotics Nausea And Vomiting  . Tetracyclines & Related     Allergies as of 10/19/2019      Reactions   Latex Other (See Comments)   Swelling   Sulfa Antibiotics Nausea And Vomiting   Tetracyclines & Related       Medication List       Accurate as of October 19, 2019 10:39 AM. If you have any questions, ask your nurse or doctor.        STOP taking these medications   acetaminophen 325 MG tablet Commonly known as: TYLENOL Stopped by: [name], MD     TAKE these medications   albuterol 108 (90 Base) MCG/ACT inhaler Commonly known as: VENTOLIN HFA Inhale 2 puffs into the lungs every 4 (four) hours as needed for wheezing or shortness of breath.   aspirin 81 MG tablet Take 81 mg by mouth daily.   cholecalciferol 1000 units tablet Commonly known as: VITAMIN D Take 1 tablet (1,000 Units total) by mouth daily.   furosemide 20 MG tablet Commonly known as: LASIX Take 20 mg by mouth every Monday, Wednesday, and Friday.   hydrocortisone 2.5 % cream Apply 1 application topically as needed (for psoriasis).   ketoconazole 2 % cream Commonly known as: NIZORAL Apply 1 application  topically as needed (for psoriasis).   lamoTRIgine 150 MG tablet Commonly known as: LAMICTAL TAKE 1 TABLET BY MOUTH TWICE DAILY.   levETIRAcetam 500 MG tablet Commonly known as: KEPPRA Take 500 mg by mouth at bedtime.   levETIRAcetam 250 MG tablet Commonly known as: KEPPRA Take 250 mg by mouth daily.   memantine 10 MG tablet Commonly known as: NAMENDA TAKE 1 TABLET BY MOUTH TWICE DAILY.   naproxen sodium 220 MG tablet Commonly known as: ALEVE Take 220 mg by mouth daily as needed.   polyethylene glycol 17 g packet Commonly known as: MIRALAX / GLYCOLAX Take 17 g by mouth at bedtime.   potassium chloride 10 MEQ tablet Commonly known as: KLOR-CON Take 10 mEq by mouth daily.   pramipexole 0.5 MG tablet Commonly known as: MIRAPEX Take 0.5 mg by mouth at bedtime.   senna 8.6 MG tablet Commonly known as: SENOKOT Take 1 tablet by mouth daily.   simvastatin 10 MG tablet Commonly known as: ZOCOR Take 10 mg by mouth daily. Reported on 06/27/2015   tiotropium 18 MCG inhalation capsule Commonly known as: Spiriva HandiHaler INHALE CONTENTS OF ONE CAPSULE ONCE DAILY FOR COPD.  Review of Systems  Constitutional: Positive for unexpected weight change.  HENT: Negative.   Respiratory: Positive for shortness of breath.   Cardiovascular: Positive for leg swelling.  Gastrointestinal: Negative.   Genitourinary: Negative.   Musculoskeletal: Negative.   Neurological: Negative.   Psychiatric/Behavioral: Negative.     Immunization History  Administered Date(s) Administered  . Influenza Split 01/06/2014  . Influenza Whole 01/07/2012, 01/06/2013  . Influenza, High Dose Seasonal PF 12/25/2015, 01/20/2017  . Influenza,inj,Quad PF,6+ Mos 12/21/2014  . Influenza-Unspecified 12/09/2018  . Moderna SARS-COVID-2 Vaccination 04/12/2019, 06/05/2019  . Pneumococcal Conjugate-13 02/01/2014  . Pneumococcal Polysaccharide-23 04/08/2004  . Td 04/08/2002  . Tdap 04/09/2011  . Zoster  04/08/2008  . Zoster Recombinat (Shingrix) 08/27/2017   Pertinent  Health Maintenance Due  Topic Date Due  . MAMMOGRAM  06/14/2015  . INFLUENZA VACCINE  11/07/2019  . DEXA SCAN  Completed  . PNA vac Low Risk Adult  Completed   Fall Risk  03/24/2014 10/15/2012  Falls in the past year? No No   Functional Status Survey:    Vitals:   10/19/19 1034  BP: 120/70  Pulse: 76  Resp: 18  Temp: 98.3 F (36.8 C)  SpO2: 96%  Weight: 163 lb 12.8 oz (74.3 kg)  Height: 5' 5.5" (1.664 m)   Body mass index is 26.84 kg/m. Physical Exam Vitals reviewed.  Constitutional:      Appearance: Normal appearance.     Comments: Has kyphosis  HENT:     Head: Normocephalic.     Nose: Nose normal.     Mouth/Throat:     Mouth: Mucous membranes are moist.     Pharynx: Oropharynx is clear.  Eyes:     Pupils: Pupils are equal, round, and reactive to light.  Cardiovascular:     Rate and Rhythm: Normal rate and regular rhythm.     Pulses: Normal pulses.  Pulmonary:     Effort: Pulmonary effort is normal.     Breath sounds: Normal breath sounds.  Abdominal:     General: Abdomen is flat. Bowel sounds are normal.     Palpations: Abdomen is soft.  Musculoskeletal:     Cervical back: Neck supple.     Comments: Bilateral moderate swelling with chronic venous changes.  Does have a spot in her right leg.  Clear discharge from there.  No redness or Pain  Skin:    General: Skin is warm.  Neurological:     General: No focal deficit present.     Mental Status: She is alert.  Psychiatric:        Mood and Affect: Mood normal.        Thought Content: Thought content normal.     Labs reviewed: Recent Labs    05/08/19 0327 05/08/19 0327 05/09/19 0175 05/09/19 1025 05/10/19 0341 05/10/19 0341 05/12/19 0334 05/12/19 0334 05/13/19 0403 05/20/19 0000 05/26/19 0000 06/08/19 0000 06/29/19 0000  NA 138   < > 138   < > 139   < > 139   < > 138   < > 142 142 141  K 3.6   < > 3.3*   < > 3.5   < > 3.3*    < > 3.6   < > 4.0 4.2 4.0  CL 103   < > 100   < > 104   < > 104   < > 105   < > 102 103 106  CO2 26   < > 30   < >  28   < > 25   < > 24   < > 31* 28* 27*  GLUCOSE 106*   < > 104*   < > 99  --  97  --  101*  --   --   --   --   BUN 17   < > 15   < > 13   < > 19   < > 16   < > 16 16 15   CREATININE 0.48   < > 0.52   < > 0.55   < > 0.62   < > 0.60   < > 0.9 0.9 0.7  CALCIUM 8.3*   < > 8.4*   < > 8.7*   < > 8.7*   < > 8.4*   < > 9.6 9.8 9.2  MG 1.8  --  1.8  --  2.0  --   --   --   --   --   --   --   --   PHOS 2.1*  --  3.3  --  3.0  --   --   --   --   --   --   --   --    < > = values in this interval not displayed.   Recent Labs    05/08/19 0327 05/08/19 0327 05/09/19 0338 05/09/19 0338 05/10/19 0341 05/20/19 0000 06/29/19 0000  AST 29   < > 26   < > 25 16 14   ALT 23   < > 22   < > 21 15 7   ALKPHOS 106   < > 97   < > 91 254* 129*  BILITOT 1.5*  --  1.1  --  1.4*  --   --   PROT 5.7*  --  5.6*  --  5.5*  --   --   ALBUMIN 3.1*   < > 2.9*   < > 2.9* 3.5 4.0   < > = values in this interval not displayed.   Recent Labs    05/04/19 0025 05/04/19 0449 05/11/19 0422 05/11/19 0422 05/12/19 0334 05/12/19 0334 05/13/19 0403 05/20/19 0000 06/29/19 0000  WBC 13.3*   < > 7.6   < > 7.4   < > 8.3 5.9 4.9  NEUTROABS 11.3*  --   --   --   --   --   --  4,260  --   HGB 13.6   < > 10.8*   < > 10.8*   < > 11.3* 12.1 13.3  HCT 41.6   < > 33.4*   < > 33.6*   < > 36.2 36 40  MCV 93.7   < > 95.4  --  95.2  --  97.1  --   --   PLT 198   < > 220   < > 247   < > 275 364 251   < > = values in this interval not displayed.   Lab Results  Component Value Date   TSH 1.79 06/29/2019   No results found for: HGBA1C Lab Results  Component Value Date   CHOL 164 10/12/2019   HDL 67 10/12/2019   LDLCALC 82 10/12/2019   TRIG 70 10/12/2019   CHOLHDL 3.6 06/27/2015    Significant Diagnostic Results in last 30 days:  No results found.  Assessment/Plan Bilateral leg edema Incrased lasix to 40  mg QD for 3 days and then 20 mg QD Wrap the Leg Incrased  Potassium to 20 meq Repeat BMP in 1 week Cut back Salt intake Elevate Legs. And Tedhoses Seizure disorder (HCC) Stable on Keppra and Lamictal Essential hypertension Stable on Lasix Norvasc was discontinued due to Dizziness Memory disorder Very High Functional on Namenda    Family/ staff Communication:   Labs/tests ordered:  BMP in 1 week

## 2019-10-26 DIAGNOSIS — I10 Essential (primary) hypertension: Secondary | ICD-10-CM | POA: Diagnosis not present

## 2019-10-27 LAB — BASIC METABOLIC PANEL
BUN: 27 — AB (ref 4–21)
CO2: 25 — AB (ref 13–22)
Chloride: 105 (ref 99–108)
Creatinine: 1 (ref 0.5–1.1)
Glucose: 85
Potassium: 4.2 (ref 3.4–5.3)
Sodium: 141 (ref 137–147)

## 2019-10-27 LAB — COMPREHENSIVE METABOLIC PANEL: Calcium: 9.6 (ref 8.7–10.7)

## 2019-11-17 ENCOUNTER — Encounter: Payer: Self-pay | Admitting: Internal Medicine

## 2019-11-17 DIAGNOSIS — L570 Actinic keratosis: Secondary | ICD-10-CM | POA: Diagnosis not present

## 2019-11-17 DIAGNOSIS — D485 Neoplasm of uncertain behavior of skin: Secondary | ICD-10-CM | POA: Diagnosis not present

## 2019-11-17 DIAGNOSIS — C44729 Squamous cell carcinoma of skin of left lower limb, including hip: Secondary | ICD-10-CM | POA: Diagnosis not present

## 2019-11-17 DIAGNOSIS — L820 Inflamed seborrheic keratosis: Secondary | ICD-10-CM | POA: Diagnosis not present

## 2019-11-17 DIAGNOSIS — L821 Other seborrheic keratosis: Secondary | ICD-10-CM | POA: Diagnosis not present

## 2019-11-17 DIAGNOSIS — Z85828 Personal history of other malignant neoplasm of skin: Secondary | ICD-10-CM | POA: Diagnosis not present

## 2019-11-23 ENCOUNTER — Non-Acute Institutional Stay: Payer: Medicare PPO | Admitting: Internal Medicine

## 2019-11-23 ENCOUNTER — Encounter: Payer: Self-pay | Admitting: Internal Medicine

## 2019-11-23 DIAGNOSIS — M7021 Olecranon bursitis, right elbow: Secondary | ICD-10-CM

## 2019-11-23 NOTE — Progress Notes (Signed)
Location:   [facility] Room Number: [place] of Service:  ALF [phone]) Provider:  Veleta Miners MD  [name], MD  Patient Care Team: [name], MD as PCP - General (Internal Medicine) [name], MD as Consulting Physician (Ophthalmology) [name], MD as Consulting Physician (Cardiology) [name], MD as Consulting Physician (Dermatology) [name], MD as Consulting Physician (Orthopedic Surgery) [name], MD as Consulting Physician (Orthopedic Surgery) [name], MD as Consulting Physician (Orthopedic Surgery) [place], [place] Mast, Man X, NP as Nurse Practitioner (Nurse Practitioner) [name], MD as Consulting Physician (Neurology)  Extended Emergency Contact Information Primary Emergency Contact: [name],[name] Address: [address]          [place] [address] [place] Phone: [phone] Relation: Sister Secondary Emergency Contact: [name] [place] Phone: [phone] Relation: Sister  Code Status:  DNR Goals of care: Advanced Directive information Advanced Directives 09/16/2019  Does Patient Have a Medical Advance Directive? Yes  Type of Advance Directive Out of facility DNR (pink MOST or yellow form)  Does patient want to make changes to medical advance directive? No - Patient declined  Copy of [place] in Chart? -  Pre-existing out of facility DNR order (yellow form or pink MOST form) Yellow form placed in chart (order not valid for inpatient use);Pink MOST form placed in chart (order not valid for inpatient use)     Chief Complaint  Patient presents with  . Acute Visit    Elbow swelling     HPI:  Pt is a 86 y.o. female seen today for an acute visit for Swelling in her Right Elbow   Patientalsohas h/o COPD, Hypertension,Seizure Disorder,Mild Cognitive impairment, Hyperlipidemia and fall in 1/21 leading to  Left Humerus Fracture with Displacement needing Arthroplasty , Right Mandibular Fracture,   Seen today for swelling in her Right Elbow Has been there for 4 months No Acute trauma But does walk with the walker NO Redness mild Pain sometimes when it rubs against something  Past Medical History:  Diagnosis Date  . Acute bronchitis 05/23/2011  . Acute upper respiratory infections of unspecified site 05/23/2011  . Arthritis   . Chronic airway obstruction, not elsewhere classified 05/23/2011  . Disturbance of salivary secretion 01/31/2011  . Dizziness and giddiness 01/31/2011  . Essential tremor 04/25/2014  . External hemorrhoids without mention of complication 23/30/0762  . Gait disorder 04/25/2014  . Insomnia, unspecified 09/12/2011  . Lumbago 01/31/2011  . Major depressive disorder, single episode, unspecified 01/31/2011  . Memory disorder 04/25/2014  . Mitral valve disorders(424.0) 01/31/2011  . Other and unspecified hyperlipidemia 01/31/2011  . Other convulsions 01/31/2011  . Other emphysema ([place]) 01/31/2011  . Pain in joint, site unspecified 01/31/2011  . Restless legs syndrome (RLS) 09/12/2011  . Retinal detachment with retinal defect of right eye 2011   right eye twice  . Senile osteoporosis 01/31/2011  . Spontaneous ecchymoses 01/31/2011  . Stiffness of joints, not elsewhere classified, multiple sites 01/31/2011  . Unspecified essential hypertension 01/31/2011   Past Surgical History:  Procedure Laterality Date  . ABDOMINAL HYSTERECTOMY  06/21/2003   TAH/BSO, omenectomy PSB resect, Stg IC cystadenofibroma  . CHOLECYSTECTOMY  2005   Dr. Marlou Starks  . ELBOW SURGERY Right 2008   broken   Dr. Apolonio Schneiders  . EYE SURGERY    . RETINAL DETACHMENT SURGERY N/A    two  . REVERSE SHOULDER ARTHROPLASTY Left 05/06/2019  Procedure: REVERSE SHOULDER ARTHROPLASTY;  Surgeon: Justice Britain, MD;  Location: [facility];  Service: Orthopedics;  Laterality: Left;  124min  . ROTATOR CUFF REPAIR Right 2012   Dr.  Theda Sers  . SQUAMOUS CELL CARCINOMA EXCISION Bilateral 2012, 8/14   [place] on legs   Dr. Sarajane Jews  . TONSILLECTOMY  1941  . VIDEO BRONCHOSCOPY WITH ENDOBRONCHIAL NAVIGATION N/A 11/29/2015   Procedure: VIDEO BRONCHOSCOPY WITH ENDOBRONCHIAL NAVIGATION;  Surgeon: Collene Gobble, MD;  Location: [facility];  Service: Thoracic;  Laterality: N/A;    Allergies  Allergen Reactions  . Latex Other (See Comments)    Swelling  . Sulfa Antibiotics Nausea And Vomiting  . Tetracyclines & Related     Allergies as of 11/23/2019      Reactions   Latex Other (See Comments)   Swelling   Sulfa Antibiotics Nausea And Vomiting   Tetracyclines & Related       Medication List       Accurate as of November 23, 2019  2:43 PM. If you have any questions, ask your nurse or doctor.        albuterol 108 (90 Base) MCG/ACT inhaler Commonly known as: VENTOLIN HFA Inhale 2 puffs into the lungs every 4 (four) hours as needed for wheezing or shortness of breath.   aspirin 81 MG tablet Take 81 mg by mouth daily.   cholecalciferol 1000 units tablet Commonly known as: VITAMIN D Take 1 tablet (1,000 Units total) by mouth daily.   furosemide 20 MG tablet Commonly known as: LASIX Take 20 mg by mouth every Monday, Wednesday, and Friday.   hydrocortisone 2.5 % cream Apply 1 application topically as needed (for psoriasis).   ketoconazole 2 % cream Commonly known as: NIZORAL Apply 1 application topically as needed (for psoriasis).   lamoTRIgine 150 MG tablet Commonly known as: LAMICTAL TAKE 1 TABLET BY MOUTH TWICE DAILY.   levETIRAcetam 500 MG tablet Commonly known as: KEPPRA Take 500 mg by mouth at bedtime.   levETIRAcetam 250 MG tablet Commonly known as: KEPPRA Take 250 mg by mouth daily.   memantine 10 MG tablet Commonly known as: NAMENDA TAKE 1 TABLET BY MOUTH TWICE DAILY.   mupirocin ointment 2 % Commonly known as: BACTROBAN Apply 1 application topically daily.   naproxen sodium 220 MG  tablet Commonly known as: ALEVE Take 220 mg by mouth daily as needed.   polyethylene glycol 17 g packet Commonly known as: MIRALAX / GLYCOLAX Take 17 g by mouth at bedtime.   Potassium Chloride ER 20 MEQ Tbcr Take 20 mEq by mouth daily.   pramipexole 0.5 MG tablet Commonly known as: MIRAPEX Take 0.5 mg by mouth at bedtime.   senna 8.6 MG tablet Commonly known as: SENOKOT Take 1 tablet by mouth daily.   simvastatin 10 MG tablet Commonly known as: ZOCOR Take 10 mg by mouth daily. Reported on 06/27/2015   terbinafine 1 % cream Commonly known as: LAMISIL Apply 1 application topically 2 (two) times daily.   tiotropium 18 MCG inhalation capsule Commonly known as: Spiriva HandiHaler INHALE CONTENTS OF ONE CAPSULE ONCE DAILY FOR COPD.       Review of Systems  Constitutional: Negative.   HENT: Negative.   Respiratory: Negative.   Cardiovascular: Positive for leg swelling.  Gastrointestinal: Negative.   Genitourinary: Negative.   Musculoskeletal: Positive for gait problem.  Skin: Positive for wound.  Neurological: Negative for dizziness.  Psychiatric/Behavioral: Negative.     Immunization History  Administered Date(s) Administered  . Influenza  Split 01/06/2014  . Influenza Whole 01/07/2012, 01/06/2013  . Influenza, High Dose Seasonal PF 12/25/2015, 01/20/2017  . Influenza,inj,Quad PF,6+ Mos 12/21/2014  . Influenza-Unspecified 12/09/2018  . Moderna SARS-COVID-2 Vaccination 04/12/2019, 06/05/2019  . Pneumococcal Conjugate-13 02/01/2014  . Pneumococcal Polysaccharide-23 04/08/2004  . Td 04/08/2002  . Tdap 04/09/2011  . Zoster 04/08/2008  . Zoster Recombinat (Shingrix) 08/27/2017   Pertinent  Health Maintenance Due  Topic Date Due  . MAMMOGRAM  06/14/2015  . INFLUENZA VACCINE  11/07/2019  . DEXA SCAN  Completed  . PNA vac Low Risk Adult  Completed   Fall Risk  03/24/2014 10/15/2012  Falls in the past year? No No   Functional Status Survey:    Vitals:    11/23/19 1436  BP: 120/70  Pulse: 70  Resp: 18  Temp: 97.8 F (36.6 C)  SpO2: 95%  Weight: 163 lb 4.8 oz (74.1 kg)  Height: 5' 5.5" (1.664 m)   Body mass index is 26.76 kg/m. Physical Exam Vitals reviewed.  Constitutional:      Appearance: Normal appearance.  HENT:     Head: Normocephalic.     Nose: Nose normal.     Mouth/Throat:     Mouth: Mucous membranes are moist.     Pharynx: Oropharynx is clear.  Eyes:     Pupils: Pupils are equal, round, and reactive to light.  Cardiovascular:     Rate and Rhythm: Normal rate.     Pulses: Normal pulses.  Pulmonary:     Effort: Pulmonary effort is normal.     Breath sounds: Normal breath sounds.  Abdominal:     General: Abdomen is flat. Bowel sounds are normal.     Palpations: Abdomen is soft.  Musculoskeletal:        General: Swelling present.     Cervical back: Neck supple.     Comments: Right Elbow had Cystic Structure.No Tender. Not Red. Elbow moving with no pain  Skin:    General: Skin is warm.     Comments: Skin cancer lesion removed from Left Lower leg  Neurological:     General: No focal deficit present.     Mental Status: She is alert and oriented to person, place, and time.  Psychiatric:        Mood and Affect: Mood normal.        Thought Content: Thought content normal.     Labs reviewed: Recent Labs    05/08/19 0327 05/08/19 0327 05/09/19 8119 05/09/19 1478 05/10/19 0341 05/10/19 0341 05/12/19 0334 05/12/19 0334 05/13/19 0403 05/20/19 0000 06/08/19 0000 06/29/19 0000 10/27/19 0000  NA 138   < > 138   < > 139   < > 139   < > 138   < > 142 141 141  K 3.6   < > 3.3*   < > 3.5   < > 3.3*   < > 3.6   < > 4.2 4.0 4.2  CL 103   < > 100   < > 104   < > 104   < > 105   < > 103 106 105  CO2 26   < > 30   < > 28   < > 25   < > 24   < > 28* 27* 25*  GLUCOSE 106*   < > 104*   < > 99  --  97  --  101*  --   --   --   --   BUN 17   < >  15   < > 13   < > 19   < > 16   < > 16 15 27*  CREATININE 0.48   < > 0.52    < > 0.55   < > 0.62   < > 0.60   < > 0.9 0.7 1.0  CALCIUM 8.3*   < > 8.4*   < > 8.7*   < > 8.7*   < > 8.4*   < > 9.8 9.2 9.6  MG 1.8  --  1.8  --  2.0  --   --   --   --   --   --   --   --   PHOS 2.1*  --  3.3  --  3.0  --   --   --   --   --   --   --   --    < > = values in this interval not displayed.   Recent Labs    05/08/19 0327 05/08/19 0327 05/09/19 0338 05/09/19 0338 05/10/19 0341 05/20/19 0000 06/29/19 0000  AST 29   < > 26   < > 25 16 14   ALT 23   < > 22   < > 21 15 7   ALKPHOS 106   < > 97   < > 91 254* 129*  BILITOT 1.5*  --  1.1  --  1.4*  --   --   PROT 5.7*  --  5.6*  --  5.5*  --   --   ALBUMIN 3.1*   < > 2.9*   < > 2.9* 3.5 4.0   < > = values in this interval not displayed.   Recent Labs    05/04/19 0025 05/04/19 0449 05/11/19 0422 05/11/19 0422 05/12/19 0334 05/12/19 0334 05/13/19 0403 05/20/19 0000 06/29/19 0000  WBC 13.3*   < > 7.6   < > 7.4   < > 8.3 5.9 4.9  NEUTROABS 11.3*  --   --   --   --   --   --  4,260  --   HGB 13.6   < > 10.8*   < > 10.8*   < > 11.3* 12.1 13.3  HCT 41.6   < > 33.4*   < > 33.6*   < > 36.2 36 40  MCV 93.7   < > 95.4  --  95.2  --  97.1  --   --   PLT 198   < > 220   < > 247   < > 275 364 251   < > = values in this interval not displayed.   Lab Results  Component Value Date   TSH 1.79 06/29/2019   No results found for: HGBA1C Lab Results  Component Value Date   CHOL 164 10/12/2019   HDL 67 10/12/2019   LDLCALC 82 10/12/2019   TRIG 70 10/12/2019   CHOLHDL 3.6 06/27/2015    Significant Diagnostic Results in last 30 days:  No results found.  Assessment/Plan Olecranon bursitis of right elbow Seems Chronic Will Refer to ortho   Other Issues  Bilateral leg edema On Lasix  Cut back Salt intake Elevate Legs. And Tedhoses Seizure disorder ([place]) Stable on Keppra and Lamictal Essential hypertension Stable on Lasix Norvasc was discontinued due to Dizziness Memory disorder Very High Functional on  Namenda   Family/ staff Communication:   Labs/tests ordered:

## 2019-12-07 ENCOUNTER — Ambulatory Visit: Payer: Self-pay | Admitting: Neurology

## 2019-12-07 DIAGNOSIS — M25521 Pain in right elbow: Secondary | ICD-10-CM | POA: Diagnosis not present

## 2019-12-09 DIAGNOSIS — Z9181 History of falling: Secondary | ICD-10-CM | POA: Diagnosis not present

## 2019-12-09 DIAGNOSIS — M6281 Muscle weakness (generalized): Secondary | ICD-10-CM | POA: Diagnosis not present

## 2019-12-09 DIAGNOSIS — R2681 Unsteadiness on feet: Secondary | ICD-10-CM | POA: Diagnosis not present

## 2019-12-09 DIAGNOSIS — S42302D Unspecified fracture of shaft of humerus, left arm, subsequent encounter for fracture with routine healing: Secondary | ICD-10-CM | POA: Diagnosis not present

## 2019-12-09 DIAGNOSIS — R29898 Other symptoms and signs involving the musculoskeletal system: Secondary | ICD-10-CM | POA: Diagnosis not present

## 2019-12-14 ENCOUNTER — Encounter: Payer: Self-pay | Admitting: Internal Medicine

## 2019-12-14 ENCOUNTER — Non-Acute Institutional Stay: Payer: Medicare PPO | Admitting: Internal Medicine

## 2019-12-14 DIAGNOSIS — L03116 Cellulitis of left lower limb: Secondary | ICD-10-CM

## 2019-12-14 DIAGNOSIS — G40909 Epilepsy, unspecified, not intractable, without status epilepticus: Secondary | ICD-10-CM | POA: Diagnosis not present

## 2019-12-14 DIAGNOSIS — C44709 Unspecified malignant neoplasm of skin of left lower limb, including hip: Secondary | ICD-10-CM | POA: Diagnosis not present

## 2019-12-14 DIAGNOSIS — Z9181 History of falling: Secondary | ICD-10-CM | POA: Diagnosis not present

## 2019-12-14 DIAGNOSIS — R2681 Unsteadiness on feet: Secondary | ICD-10-CM | POA: Diagnosis not present

## 2019-12-14 DIAGNOSIS — S42302D Unspecified fracture of shaft of humerus, left arm, subsequent encounter for fracture with routine healing: Secondary | ICD-10-CM | POA: Diagnosis not present

## 2019-12-14 DIAGNOSIS — I10 Essential (primary) hypertension: Secondary | ICD-10-CM

## 2019-12-14 DIAGNOSIS — R600 Localized edema: Secondary | ICD-10-CM | POA: Diagnosis not present

## 2019-12-14 DIAGNOSIS — M6281 Muscle weakness (generalized): Secondary | ICD-10-CM | POA: Diagnosis not present

## 2019-12-14 DIAGNOSIS — R413 Other amnesia: Secondary | ICD-10-CM | POA: Diagnosis not present

## 2019-12-14 DIAGNOSIS — R29898 Other symptoms and signs involving the musculoskeletal system: Secondary | ICD-10-CM | POA: Diagnosis not present

## 2019-12-14 NOTE — Progress Notes (Signed)
Location:  [facility] Place of Service:   [place]  Provider: Veleta Miners, MD  Code Status: DNR Goals of Care:  Advanced Directives 09/16/2019  Does Patient Have a Medical Advance Directive? Yes  Type of Advance Directive Out of facility DNR (pink MOST or yellow form)  Does patient want to make changes to medical advance directive? No - Patient declined  Copy of Edith Endave in Chart? -  Pre-existing out of facility DNR order (yellow form or pink MOST form) Yellow form placed in chart (order not valid for inpatient use);Pink MOST form placed in chart (order not valid for inpatient use)     Chief Complaint  Patient presents with  . Acute Visit    Left leg swelling and redness    HPI: Patient is a 86 y.o. female seen today for an acute visit for Left Leg swelling and Redness and Yellowish discharge  Patientalsohas h/o COPD, Hypertension,Seizure Disorder,Mild Cognitive impairment, Hyperlipidemia and fall in 1/21 leading to Left Humerus Fracture with Displacement needing Arthroplasty , Right Mandibular Fracture, Pneumonia and Urinary retention  Since then she has been in AL She had Biopsy and Excision for a lesion in her Left leg by Dermatology few weeks ago. Since few days that are has been red hurting and discharge coming. Area has small Necrotic base No Fever or Chills Patient is independent in her ADLs walks with a walker.  Her weight is up 3 pounds Her weight is up  again  Past Medical History:  Diagnosis Date  . Acute bronchitis 05/23/2011  . Acute upper respiratory infections of unspecified site 05/23/2011  . Arthritis   . Chronic airway obstruction, not elsewhere classified 05/23/2011  . Disturbance of salivary secretion 01/31/2011  . Dizziness and giddiness 01/31/2011  . Essential tremor 04/25/2014  . External hemorrhoids without mention of complication 37/16/9678  . Gait disorder 04/25/2014  . Insomnia, unspecified  09/12/2011  . Lumbago 01/31/2011  . Major depressive disorder, single episode, unspecified 01/31/2011  . Memory disorder 04/25/2014  . Mitral valve disorders(424.0) 01/31/2011  . Other and unspecified hyperlipidemia 01/31/2011  . Other convulsions 01/31/2011  . Other emphysema ([place]) 01/31/2011  . Pain in joint, site unspecified 01/31/2011  . Restless legs syndrome (RLS) 09/12/2011  . Retinal detachment with retinal defect of right eye 2011   right eye twice  . Senile osteoporosis 01/31/2011  . Spontaneous ecchymoses 01/31/2011  . Stiffness of joints, not elsewhere classified, multiple sites 01/31/2011  . Unspecified essential hypertension 01/31/2011    Past Surgical History:  Procedure Laterality Date  . ABDOMINAL HYSTERECTOMY  06/21/2003   TAH/BSO, omenectomy PSB resect, Stg IC cystadenofibroma  . CHOLECYSTECTOMY  2005   Dr. Marlou Starks  . ELBOW SURGERY Right 2008   broken   Dr. Apolonio Schneiders  . EYE SURGERY    . RETINAL DETACHMENT SURGERY N/A    two  . REVERSE SHOULDER ARTHROPLASTY Left 05/06/2019   Procedure: REVERSE SHOULDER ARTHROPLASTY;  Surgeon: Justice Britain, MD;  Location: [facility];  Service: Orthopedics;  Laterality: Left;  148min  . ROTATOR CUFF REPAIR Right 2012   Dr. Theda Sers  . SQUAMOUS CELL CARCINOMA EXCISION Bilateral 2012, 8/14   [place] on legs   Dr. Sarajane Jews  . TONSILLECTOMY  1941  . VIDEO BRONCHOSCOPY WITH ENDOBRONCHIAL NAVIGATION N/A 11/29/2015   Procedure: VIDEO BRONCHOSCOPY WITH ENDOBRONCHIAL NAVIGATION;  Surgeon: Collene Gobble, MD;  Location: [facility];  Service: Thoracic;  Laterality: N/A;    Allergies  Allergen Reactions  . Latex Other (See Comments)    Swelling  . Sulfa Antibiotics Nausea And Vomiting  . Tetracyclines & Related     Outpatient Encounter Medications as of 12/14/2019  Medication Sig  . albuterol (VENTOLIN HFA) 108 (90 Base) MCG/ACT inhaler Inhale 2 puffs into the lungs every 4 (four) hours as needed for wheezing or shortness of breath.  [name] aspirin 81 MG  tablet Take 81 mg by mouth daily.  . cholecalciferol (VITAMIN D) 1000 units tablet Take 1 tablet (1,000 Units total) by mouth daily.  . furosemide (LASIX) 20 MG tablet Take 20 mg by mouth every Monday, Wednesday, and Friday.  . hydrocortisone 2.5 % cream Apply 1 application topically as needed (for psoriasis).   [name] ketoconazole (NIZORAL) 2 % cream Apply 1 application topically as needed (for psoriasis).   [name] lamoTRIgine (LAMICTAL) 150 MG tablet TAKE 1 TABLET BY MOUTH TWICE DAILY.  [name] levETIRAcetam (KEPPRA) 250 MG tablet Take 250 mg by mouth daily.  [name] levETIRAcetam (KEPPRA) 500 MG tablet Take 500 mg by mouth at bedtime.  . memantine (NAMENDA) 10 MG tablet TAKE 1 TABLET BY MOUTH TWICE DAILY.  . mupirocin ointment (BACTROBAN) 2 % Apply 1 application topically daily.  . naproxen sodium (ALEVE) 220 MG tablet Take 220 mg by mouth daily as needed.  . polyethylene glycol (MIRALAX / GLYCOLAX) 17 g packet Take 17 g by mouth at bedtime.  . Potassium Chloride ER 20 MEQ TBCR Take 20 mEq by mouth daily.   . pramipexole (MIRAPEX) 0.5 MG tablet Take 0.5 mg by mouth at bedtime.   . senna (SENOKOT) 8.6 MG tablet Take 1 tablet by mouth daily.  . simvastatin (ZOCOR) 10 MG tablet Take 10 mg by mouth daily. Reported on 06/27/2015  . terbinafine (LAMISIL) 1 % cream Apply 1 application topically 2 (two) times daily.  [name] tiotropium (SPIRIVA HANDIHALER) 18 MCG inhalation capsule INHALE CONTENTS OF ONE CAPSULE ONCE DAILY FOR COPD.   No facility-administered encounter medications on file as of 12/14/2019.    Review of Systems:  Review of Systems  Constitutional: Positive for unexpected weight change.  HENT: Negative.   Respiratory: Positive for shortness of breath.   Cardiovascular: Positive for leg swelling.  Gastrointestinal: Negative.   Genitourinary: Negative.   Musculoskeletal: Negative.   Skin: Positive for wound.  Neurological: Negative.   Psychiatric/Behavioral: Negative.   All other systems reviewed and are  negative.   Health Maintenance  Topic Date Due  . MAMMOGRAM  06/14/2015  . INFLUENZA VACCINE  11/07/2019  . TETANUS/TDAP  04/08/2021  . DEXA SCAN  Completed  . COVID-19 Vaccine  Completed  . PNA vac Low Risk Adult  Completed    Physical Exam: There were no vitals filed for this visit. There is no height or weight on file to calculate BMI. Physical Exam Vitals reviewed.  Constitutional:      Appearance: Normal appearance.     Comments: Has Scoliosis  HENT:     Head: Normocephalic.     Nose: Nose normal.     Mouth/Throat:     Mouth: Mucous membranes are moist.     Pharynx: Oropharynx is clear.  Eyes:     Pupils: Pupils are equal, round, and reactive to light.  Cardiovascular:     Rate and Rhythm: Normal rate and regular rhythm.     Pulses: Normal pulses.  Pulmonary:     Effort: Pulmonary effort is normal.     Breath sounds: Normal breath sounds.  Abdominal:  General: Abdomen is flat. Bowel sounds are normal.     Palpations: Abdomen is soft.  Musculoskeletal:        General: Swelling present.     Cervical back: Neck supple.     Comments: With Chronic Venous Changes  Skin:    Comments: Small Coin like open Wound where the Biopsy was done on Left LE. Has Necrosis in the Base with redness around and tender. Some Discharge  Neurological:     General: No focal deficit present.     Mental Status: She is alert and oriented to person, place, and time.  Psychiatric:        Mood and Affect: Mood normal.        Thought Content: Thought content normal.     Labs reviewed: Basic Metabolic Panel: Recent Labs    05/08/19 0327 05/08/19 0327 05/09/19 5465 05/09/19 6812 05/10/19 0341 05/10/19 0341 05/12/19 0334 05/12/19 0334 05/13/19 0403 05/20/19 0000 06/08/19 0000 06/29/19 0000 10/27/19 0000  NA 138   < > 138   < > 139   < > 139   < > 138   < > 142 141 141  K 3.6   < > 3.3*   < > 3.5   < > 3.3*   < > 3.6   < > 4.2 4.0 4.2  CL 103   < > 100   < > 104   < > 104    < > 105   < > 103 106 105  CO2 26   < > 30   < > 28   < > 25   < > 24   < > 28* 27* 25*  GLUCOSE 106*   < > 104*   < > 99  --  97  --  101*  --   --   --   --   BUN 17   < > 15   < > 13   < > 19   < > 16   < > 16 15 27*  CREATININE 0.48   < > 0.52   < > 0.55   < > 0.62   < > 0.60   < > 0.9 0.7 1.0  CALCIUM 8.3*   < > 8.4*   < > 8.7*   < > 8.7*   < > 8.4*   < > 9.8 9.2 9.6  MG 1.8  --  1.8  --  2.0  --   --   --   --   --   --   --   --   PHOS 2.1*  --  3.3  --  3.0  --   --   --   --   --   --   --   --   TSH  --   --   --   --   --   --   --   --   --   --   --  1.79  --    < > = values in this interval not displayed.   Liver Function Tests: Recent Labs    05/08/19 0327 05/08/19 0327 05/09/19 0338 05/09/19 0338 05/10/19 0341 05/20/19 0000 06/29/19 0000  AST 29   < > 26   < > 25 16 14   ALT 23   < > 22   < > 21 15 7   ALKPHOS 106   < > 97   < > 91  254* 129*  BILITOT 1.5*  --  1.1  --  1.4*  --   --   PROT 5.7*  --  5.6*  --  5.5*  --   --   ALBUMIN 3.1*   < > 2.9*   < > 2.9* 3.5 4.0   < > = values in this interval not displayed.   No results for input(s): LIPASE, AMYLASE in the last 8760 hours. No results for input(s): AMMONIA in the last 8760 hours. CBC: Recent Labs    05/04/19 0025 05/04/19 0449 05/11/19 0422 05/11/19 0422 05/12/19 0334 05/12/19 0334 05/13/19 0403 05/20/19 0000 06/29/19 0000  WBC 13.3*   < > 7.6   < > 7.4   < > 8.3 5.9 4.9  NEUTROABS 11.3*  --   --   --   --   --   --  4,260  --   HGB 13.6   < > 10.8*   < > 10.8*   < > 11.3* 12.1 13.3  HCT 41.6   < > 33.4*   < > 33.6*   < > 36.2 36 40  MCV 93.7   < > 95.4  --  95.2  --  97.1  --   --   PLT 198   < > 220   < > 247   < > 275 364 251   < > = values in this interval not displayed.   Lipid Panel: Recent Labs    10/12/19 0000  CHOL 164  HDL 67  LDLCALC 82  TRIG 70   No results found for: HGBA1C  Procedures since last visit: No results found.  Assessment/Plan Left leg cellulitis Keflex 500  mg TID for 7 Days  S/P Excision Cancer of skin of left leg Dry Dressing for now. Hold Santyl for now Bilateral leg edema Incrase Lasix 40 mg QD with potassium for 1 weeks and then 20 mg QD BMP in 2 weeks Seizure disorder (HCC) Keppra and Lamictal Essential hypertension Only on Lasix Memory disorder Highly Functional On Namenda per Neurology   Labs/tests ordered:  * No order type specified * Next appt:  Visit date not found

## 2019-12-21 DIAGNOSIS — M6281 Muscle weakness (generalized): Secondary | ICD-10-CM | POA: Diagnosis not present

## 2019-12-21 DIAGNOSIS — Z9181 History of falling: Secondary | ICD-10-CM | POA: Diagnosis not present

## 2019-12-21 DIAGNOSIS — R29898 Other symptoms and signs involving the musculoskeletal system: Secondary | ICD-10-CM | POA: Diagnosis not present

## 2019-12-21 DIAGNOSIS — R2681 Unsteadiness on feet: Secondary | ICD-10-CM | POA: Diagnosis not present

## 2019-12-21 DIAGNOSIS — S42302D Unspecified fracture of shaft of humerus, left arm, subsequent encounter for fracture with routine healing: Secondary | ICD-10-CM | POA: Diagnosis not present

## 2019-12-31 DIAGNOSIS — I8311 Varicose veins of right lower extremity with inflammation: Secondary | ICD-10-CM | POA: Diagnosis not present

## 2019-12-31 DIAGNOSIS — I872 Venous insufficiency (chronic) (peripheral): Secondary | ICD-10-CM | POA: Diagnosis not present

## 2019-12-31 DIAGNOSIS — L821 Other seborrheic keratosis: Secondary | ICD-10-CM | POA: Diagnosis not present

## 2019-12-31 DIAGNOSIS — Z85828 Personal history of other malignant neoplasm of skin: Secondary | ICD-10-CM | POA: Diagnosis not present

## 2019-12-31 DIAGNOSIS — I8312 Varicose veins of left lower extremity with inflammation: Secondary | ICD-10-CM | POA: Diagnosis not present

## 2020-01-04 ENCOUNTER — Non-Acute Institutional Stay: Payer: Medicare PPO | Admitting: Internal Medicine

## 2020-01-04 ENCOUNTER — Encounter: Payer: Self-pay | Admitting: Internal Medicine

## 2020-01-04 DIAGNOSIS — G40909 Epilepsy, unspecified, not intractable, without status epilepticus: Secondary | ICD-10-CM

## 2020-01-04 DIAGNOSIS — I10 Essential (primary) hypertension: Secondary | ICD-10-CM | POA: Diagnosis not present

## 2020-01-04 DIAGNOSIS — R600 Localized edema: Secondary | ICD-10-CM | POA: Diagnosis not present

## 2020-01-04 DIAGNOSIS — C44709 Unspecified malignant neoplasm of skin of left lower limb, including hip: Secondary | ICD-10-CM

## 2020-01-04 NOTE — Progress Notes (Signed)
Location:   [facility] Room Number: [place] of Service:  ALF [phone]) Provider:  Veleta Miners MD  [name], MD  Patient Care Team: [name], MD as PCP - General (Internal Medicine) [name], MD as Consulting Physician (Ophthalmology) [name], MD as Consulting Physician (Cardiology) [name], MD as Consulting Physician (Dermatology) [name], MD as Consulting Physician (Orthopedic Surgery) [name], MD as Consulting Physician (Orthopedic Surgery) [name], MD as Consulting Physician (Orthopedic Surgery) Bloxom, Harwick Mast, Man X, NP as Nurse Practitioner (Nurse Practitioner) [name], MD as Consulting Physician (Neurology)  Extended Emergency Contact Information Primary Emergency Contact: Bentley,Martha Address: [address]          [place] [address] [place] Phone: [phone] Relation: Sister Secondary Emergency Contact: [name] [place] Phone: [phone] Relation: Sister  Code Status:  DNR Goals of care: Advanced Directive information Advanced Directives 12/14/2019  Does Patient Have a Medical Advance Directive? Yes  Type of Advance Directive Out of facility DNR (pink MOST or yellow form)  Does patient want to make changes to medical advance directive? No - Patient declined  Copy of [place] in Chart? -  Pre-existing out of facility DNR order (yellow form or pink MOST form) Pink MOST form placed in chart (order not valid for inpatient use)     Chief Complaint  Patient presents with  . Acute Visit    Hypertention    HPI:  Pt is a 86 y.o. female seen today for an acute visit for Hypertension  Patientalsohas h/o COPD, Hypertension,Seizure Disorder,Mild Cognitive impairment, Hyperlipidemiaand fall in 1/21 leading toLeft Humerus Fracture with Displacement needing Arthroplasty , Right Mandibular  Fracture, Pneumonia and Urinary retention  She is AL  Hypertension Noticed to have High BP readings last few days.180/100 Started back on Norvasc and BP is better now Denies any dizziness LE edema with Chronic changes Doing well on Lasix  Patient is independent in her ADLs walks with a walker  Past Medical History:  Diagnosis Date  . Acute bronchitis 05/23/2011  . Acute upper respiratory infections of unspecified site 05/23/2011  . Arthritis   . Chronic airway obstruction, not elsewhere classified 05/23/2011  . Disturbance of salivary secretion 01/31/2011  . Dizziness and giddiness 01/31/2011  . Essential tremor 04/25/2014  . External hemorrhoids without mention of complication 65/06/5463  . Gait disorder 04/25/2014  . Insomnia, unspecified 09/12/2011  . Lumbago 01/31/2011  . Major depressive disorder, single episode, unspecified 01/31/2011  . Memory disorder 04/25/2014  . Mitral valve disorders(424.0) 01/31/2011  . Other and unspecified hyperlipidemia 01/31/2011  . Other convulsions 01/31/2011  . Other emphysema ([place]) 01/31/2011  . Pain in joint, site unspecified 01/31/2011  . Restless legs syndrome (RLS) 09/12/2011  . Retinal detachment with retinal defect of right eye 2011   right eye twice  . Senile osteoporosis 01/31/2011  . Spontaneous ecchymoses 01/31/2011  . Stiffness of joints, not elsewhere classified, multiple sites 01/31/2011  . Unspecified essential hypertension 01/31/2011   Past Surgical History:  Procedure Laterality Date  . ABDOMINAL HYSTERECTOMY  06/21/2003   TAH/BSO, omenectomy PSB resect, Stg IC cystadenofibroma  . CHOLECYSTECTOMY  2005   Dr. Marlou Starks  . ELBOW SURGERY Right 2008   broken   Dr. Apolonio Schneiders  . EYE SURGERY    . RETINAL DETACHMENT SURGERY N/A    two  . REVERSE SHOULDER ARTHROPLASTY Left 05/06/2019   Procedure: REVERSE  SHOULDER ARTHROPLASTY;  Surgeon: Justice Britain, MD;  Location: [facility];  Service: Orthopedics;  Laterality: Left;  168min  . ROTATOR CUFF  REPAIR Right 2012   Dr. Theda Sers  . SQUAMOUS CELL CARCINOMA EXCISION Bilateral 2012, 8/14   [place] on legs   Dr. Sarajane Jews  . TONSILLECTOMY  1941  . VIDEO BRONCHOSCOPY WITH ENDOBRONCHIAL NAVIGATION N/A 11/29/2015   Procedure: VIDEO BRONCHOSCOPY WITH ENDOBRONCHIAL NAVIGATION;  Surgeon: Collene Gobble, MD;  Location: [facility];  Service: Thoracic;  Laterality: N/A;    Allergies  Allergen Reactions  . Latex Other (See Comments)    Swelling  . Sulfa Antibiotics Nausea And Vomiting  . Tetracyclines & Related     Allergies as of 01/04/2020      Reactions   Latex Other (See Comments)   Swelling   Sulfa Antibiotics Nausea And Vomiting   Tetracyclines & Related       Medication List       Accurate as of January 04, 2020  9:22 PM. If you have any questions, ask your nurse or doctor.        albuterol 108 (90 Base) MCG/ACT inhaler Commonly known as: VENTOLIN HFA Inhale 2 puffs into the lungs every 4 (four) hours as needed for wheezing or shortness of breath.   amLODipine 2.5 MG tablet Commonly known as: NORVASC Take 2.5 mg by mouth daily.   aspirin 81 MG tablet Take 81 mg by mouth daily.   cholecalciferol 1000 units tablet Commonly known as: VITAMIN D Take 1 tablet (1,000 Units total) by mouth daily.   furosemide 20 MG tablet Commonly known as: LASIX Take 20 mg by mouth daily.   hydrocortisone 2.5 % cream Apply 1 application topically as needed (for psoriasis).   ketoconazole 2 % cream Commonly known as: NIZORAL Apply 1 application topically as needed (for psoriasis).   lamoTRIgine 150 MG tablet Commonly known as: LAMICTAL TAKE 1 TABLET BY MOUTH TWICE DAILY.   levETIRAcetam 500 MG tablet Commonly known as: KEPPRA Take 500 mg by mouth at bedtime.   levETIRAcetam 250 MG tablet Commonly known as: KEPPRA Take 250 mg by mouth daily.   memantine 10 MG tablet Commonly known as: NAMENDA TAKE 1 TABLET BY MOUTH TWICE DAILY.   mupirocin ointment 2 % Commonly known as:  BACTROBAN Apply 1 application topically daily.   naproxen sodium 220 MG tablet Commonly known as: ALEVE Take 220 mg by mouth daily as needed.   polyethylene glycol 17 g packet Commonly known as: MIRALAX / GLYCOLAX Take 17 g by mouth at bedtime.   Potassium Chloride ER 20 MEQ Tbcr Take 20 mEq by mouth daily.   pramipexole 0.5 MG tablet Commonly known as: MIRAPEX Take 0.5 mg by mouth at bedtime.   senna 8.6 MG tablet Commonly known as: SENOKOT Take 1 tablet by mouth daily.   simvastatin 10 MG tablet Commonly known as: ZOCOR Take 10 mg by mouth daily. Reported on 06/27/2015   tiotropium 18 MCG inhalation capsule Commonly known as: Spiriva HandiHaler INHALE CONTENTS OF ONE CAPSULE ONCE DAILY FOR COPD.       Review of Systems  Review of Systems  Constitutional: Negative for activity change, appetite change, chills, diaphoresis, fatigue and fever.  HENT: Negative for mouth sores, postnasal drip, rhinorrhea, sinus pain and sore throat.   Respiratory: Negative for apnea, cough, chest tightness, shortness of breath and wheezing.   Cardiovascular: Negative for chest pain, palpitations   Gastrointestinal: Negative for abdominal distention, abdominal pain, constipation, diarrhea, nausea and  vomiting.  Genitourinary: Negative for dysuria and frequency.  Musculoskeletal: Negative for arthralgias, joint swelling and myalgias.  Skin: Negative for rash.  Neurological: Negative for dizziness, syncope, weakness, light-headedness and numbness.  Psychiatric/Behavioral: Negative for behavioral problems, confusion and sleep disturbance.     Immunization History  Administered Date(s) Administered  . Influenza Split 01/06/2014  . Influenza Whole 01/07/2012, 01/06/2013  . Influenza, High Dose Seasonal PF 12/25/2015, 01/20/2017  . Influenza,inj,Quad PF,6+ Mos 12/21/2014  . Influenza-Unspecified 12/09/2018  . Moderna SARS-COVID-2 Vaccination 04/12/2019, 06/05/2019  . Pneumococcal  Conjugate-13 02/01/2014  . Pneumococcal Polysaccharide-23 04/08/2004  . Td 04/08/2002  . Tdap 04/09/2011  . Zoster 04/08/2008  . Zoster Recombinat (Shingrix) 08/27/2017   Pertinent  Health Maintenance Due  Topic Date Due  . MAMMOGRAM  06/14/2015  . INFLUENZA VACCINE  11/07/2019  . DEXA SCAN  Completed  . PNA vac Low Risk Adult  Completed   Fall Risk  03/24/2014 10/15/2012  Falls in the past year? No No   Functional Status Survey:    Vitals:   01/04/20 0950  BP: 140/80  Pulse: 82  Resp: 20  Temp: 97.6 F (36.4 C)  SpO2: 91%  Weight: 165 lb (74.8 kg)  Height: 5' 5.5" (1.664 m)   Body mass index is 27.04 kg/m. Physical Exam Constitutional: Oriented to person, place, and time. Well-developed and well-nourished.  HENT:  Head: Normocephalic.  Mouth/Throat: Oropharynx is clear and moist.  Eyes: Pupils are equal, round, and reactive to light.  Neck: Neck supple.  Cardiovascular: Normal rate and normal heart sounds.  No murmur heard. Pulmonary/Chest: Effort normal and breath sounds normal. No respiratory distress. No wheezes. She has no rales.  Abdominal: Soft. Bowel sounds are normal. No distension. There is no tenderness. There is no rebound.  Musculoskeletal: Chronic Venous Edema Bilateral  Lymphadenopathy: none Neurological: Alert and oriented to person, place, and time. Walks with the walker Skin: Skin is warm and dry.  Psychiatric: Normal mood and affect. Behavior is normal. Thought content normal.   Labs reviewed: Recent Labs    05/08/19 0327 05/08/19 0327 05/09/19 0338 05/09/19 0338 05/10/19 0341 05/10/19 0341 05/12/19 0334 05/12/19 0334 05/13/19 0403 05/20/19 0000 06/08/19 0000 06/29/19 0000 10/27/19 0000  NA 138   < > 138   < > 139   < > 139   < > 138   < > 142 141 141  K 3.6   < > 3.3*   < > 3.5   < > 3.3*   < > 3.6   < > 4.2 4.0 4.2  CL 103   < > 100   < > 104   < > 104   < > 105   < > 103 106 105  CO2 26   < > 30   < > 28   < > 25   < > 24   < >  28* 27* 25*  GLUCOSE 106*   < > 104*   < > 99  --  97  --  101*  --   --   --   --   BUN 17   < > 15   < > 13   < > 19   < > 16   < > 16 15 27*  CREATININE 0.48   < > 0.52   < > 0.55   < > 0.62   < > 0.60   < > 0.9 0.7 1.0  CALCIUM 8.3*   < >  8.4*   < > 8.7*   < > 8.7*   < > 8.4*   < > 9.8 9.2 9.6  MG 1.8  --  1.8  --  2.0  --   --   --   --   --   --   --   --   PHOS 2.1*  --  3.3  --  3.0  --   --   --   --   --   --   --   --    < > = values in this interval not displayed.   Recent Labs    05/08/19 0327 05/08/19 0327 05/09/19 0338 05/09/19 0338 05/10/19 0341 05/20/19 0000 06/29/19 0000  AST 29   < > 26   < > 25 16 14   ALT 23   < > 22   < > 21 15 7   ALKPHOS 106   < > 97   < > 91 254* 129*  BILITOT 1.5*  --  1.1  --  1.4*  --   --   PROT 5.7*  --  5.6*  --  5.5*  --   --   ALBUMIN 3.1*   < > 2.9*   < > 2.9* 3.5 4.0   < > = values in this interval not displayed.   Recent Labs    05/04/19 0025 05/04/19 0449 05/11/19 0422 05/11/19 0422 05/12/19 0334 05/12/19 0334 05/13/19 0403 05/20/19 0000 06/29/19 0000  WBC 13.3*   < > 7.6   < > 7.4   < > 8.3 5.9 4.9  NEUTROABS 11.3*  --   --   --   --   --   --  4,260  --   HGB 13.6   < > 10.8*   < > 10.8*   < > 11.3* 12.1 13.3  HCT 41.6   < > 33.4*   < > 33.6*   < > 36.2 36 40  MCV 93.7   < > 95.4  --  95.2  --  97.1  --   --   PLT 198   < > 220   < > 247   < > 275 364 251   < > = values in this interval not displayed.   Lab Results  Component Value Date   TSH 1.79 06/29/2019   No results found for: HGBA1C Lab Results  Component Value Date   CHOL 164 10/12/2019   HDL 67 10/12/2019   LDLCALC 82 10/12/2019   TRIG 70 10/12/2019   CHOLHDL 3.6 06/27/2015    Significant Diagnostic Results in last 30 days:  No results found.  Assessment/Plan Essential hypertension Continue on Norvasc  Bilateral leg edema Continue lasix 20 mg Repeat BMP  Seizure disorder (HCC) Stable on Keprra and Lamictal Cancer of skin of left  leg Got follow up with Dermatology  Memory disorder MMSE was 30/30 in Neurology Office Highly Functional  On Namenda per Neurology Hyperlipidemia On Zocor LDL 82 in 7/21 Restless Leg On Mirapex  COPD On Spiriva Senile osteoporosis Fosamax Was stopped due to he rrequest   Family/ staff Communication:   Labs/tests ordered:  CBC,CMP

## 2020-01-05 DIAGNOSIS — R2681 Unsteadiness on feet: Secondary | ICD-10-CM | POA: Diagnosis not present

## 2020-01-05 DIAGNOSIS — Z9181 History of falling: Secondary | ICD-10-CM | POA: Diagnosis not present

## 2020-01-05 DIAGNOSIS — M6281 Muscle weakness (generalized): Secondary | ICD-10-CM | POA: Diagnosis not present

## 2020-01-05 DIAGNOSIS — R29898 Other symptoms and signs involving the musculoskeletal system: Secondary | ICD-10-CM | POA: Diagnosis not present

## 2020-01-05 DIAGNOSIS — S42302D Unspecified fracture of shaft of humerus, left arm, subsequent encounter for fracture with routine healing: Secondary | ICD-10-CM | POA: Diagnosis not present

## 2020-01-10 DIAGNOSIS — M6281 Muscle weakness (generalized): Secondary | ICD-10-CM | POA: Diagnosis not present

## 2020-01-10 DIAGNOSIS — Z9181 History of falling: Secondary | ICD-10-CM | POA: Diagnosis not present

## 2020-01-10 DIAGNOSIS — R2681 Unsteadiness on feet: Secondary | ICD-10-CM | POA: Diagnosis not present

## 2020-01-11 DIAGNOSIS — I10 Essential (primary) hypertension: Secondary | ICD-10-CM | POA: Diagnosis not present

## 2020-01-11 LAB — BASIC METABOLIC PANEL
BUN: 34 — AB (ref 4–21)
CO2: 27 — AB (ref 13–22)
Chloride: 106 (ref 99–108)
Creatinine: 0.9 (ref 0.5–1.1)
Glucose: 77
Potassium: 3.9 (ref 3.4–5.3)
Sodium: 143 (ref 137–147)

## 2020-01-11 LAB — COMPREHENSIVE METABOLIC PANEL
Albumin: 4.5 (ref 3.5–5.0)
Calcium: 9.9 (ref 8.7–10.7)
GFR calc Af Amer: 69
GFR calc non Af Amer: 60
Globulin: 2.8

## 2020-01-11 LAB — CBC AND DIFFERENTIAL
HCT: 44 (ref 36–46)
Hemoglobin: 14.5 (ref 12.0–16.0)
Neutrophils Absolute: 2953
Platelets: 249 (ref 150–399)
WBC: 5.1

## 2020-01-11 LAB — HEPATIC FUNCTION PANEL
ALT: 11 (ref 7–35)
AST: 16 (ref 13–35)
Alkaline Phosphatase: 109 (ref 25–125)
Bilirubin, Total: 0.7

## 2020-01-11 LAB — CBC: RBC: 5.01 (ref 3.87–5.11)

## 2020-01-13 DIAGNOSIS — Z9181 History of falling: Secondary | ICD-10-CM | POA: Diagnosis not present

## 2020-01-13 DIAGNOSIS — M6281 Muscle weakness (generalized): Secondary | ICD-10-CM | POA: Diagnosis not present

## 2020-01-13 DIAGNOSIS — R2681 Unsteadiness on feet: Secondary | ICD-10-CM | POA: Diagnosis not present

## 2020-01-18 DIAGNOSIS — Z9181 History of falling: Secondary | ICD-10-CM | POA: Diagnosis not present

## 2020-01-18 DIAGNOSIS — M6281 Muscle weakness (generalized): Secondary | ICD-10-CM | POA: Diagnosis not present

## 2020-01-18 DIAGNOSIS — R2681 Unsteadiness on feet: Secondary | ICD-10-CM | POA: Diagnosis not present

## 2020-01-19 DIAGNOSIS — Z96612 Presence of left artificial shoulder joint: Secondary | ICD-10-CM | POA: Diagnosis not present

## 2020-01-19 DIAGNOSIS — M7021 Olecranon bursitis, right elbow: Secondary | ICD-10-CM | POA: Diagnosis not present

## 2020-01-21 DIAGNOSIS — Z9181 History of falling: Secondary | ICD-10-CM | POA: Diagnosis not present

## 2020-01-21 DIAGNOSIS — R2681 Unsteadiness on feet: Secondary | ICD-10-CM | POA: Diagnosis not present

## 2020-01-21 DIAGNOSIS — M6281 Muscle weakness (generalized): Secondary | ICD-10-CM | POA: Diagnosis not present

## 2020-01-24 DIAGNOSIS — Z9181 History of falling: Secondary | ICD-10-CM | POA: Diagnosis not present

## 2020-01-24 DIAGNOSIS — M6281 Muscle weakness (generalized): Secondary | ICD-10-CM | POA: Diagnosis not present

## 2020-01-24 DIAGNOSIS — R2681 Unsteadiness on feet: Secondary | ICD-10-CM | POA: Diagnosis not present

## 2020-01-28 DIAGNOSIS — Z9181 History of falling: Secondary | ICD-10-CM | POA: Diagnosis not present

## 2020-01-28 DIAGNOSIS — M6281 Muscle weakness (generalized): Secondary | ICD-10-CM | POA: Diagnosis not present

## 2020-01-28 DIAGNOSIS — R2681 Unsteadiness on feet: Secondary | ICD-10-CM | POA: Diagnosis not present

## 2020-01-31 DIAGNOSIS — M6281 Muscle weakness (generalized): Secondary | ICD-10-CM | POA: Diagnosis not present

## 2020-01-31 DIAGNOSIS — Z9181 History of falling: Secondary | ICD-10-CM | POA: Diagnosis not present

## 2020-01-31 DIAGNOSIS — R2681 Unsteadiness on feet: Secondary | ICD-10-CM | POA: Diagnosis not present

## 2020-02-04 DIAGNOSIS — R2681 Unsteadiness on feet: Secondary | ICD-10-CM | POA: Diagnosis not present

## 2020-02-04 DIAGNOSIS — M6281 Muscle weakness (generalized): Secondary | ICD-10-CM | POA: Diagnosis not present

## 2020-02-04 DIAGNOSIS — Z9181 History of falling: Secondary | ICD-10-CM | POA: Diagnosis not present

## 2020-02-09 ENCOUNTER — Encounter: Payer: Self-pay | Admitting: Nurse Practitioner

## 2020-02-09 ENCOUNTER — Non-Acute Institutional Stay: Payer: Medicare PPO | Admitting: Nurse Practitioner

## 2020-02-09 DIAGNOSIS — I10 Essential (primary) hypertension: Secondary | ICD-10-CM

## 2020-02-09 DIAGNOSIS — G40909 Epilepsy, unspecified, not intractable, without status epilepticus: Secondary | ICD-10-CM

## 2020-02-09 DIAGNOSIS — K5901 Slow transit constipation: Secondary | ICD-10-CM

## 2020-02-09 DIAGNOSIS — B372 Candidiasis of skin and nail: Secondary | ICD-10-CM

## 2020-02-09 DIAGNOSIS — M479 Spondylosis, unspecified: Secondary | ICD-10-CM | POA: Diagnosis not present

## 2020-02-09 DIAGNOSIS — M7021 Olecranon bursitis, right elbow: Secondary | ICD-10-CM

## 2020-02-09 DIAGNOSIS — G2581 Restless legs syndrome: Secondary | ICD-10-CM | POA: Diagnosis not present

## 2020-02-09 DIAGNOSIS — R609 Edema, unspecified: Secondary | ICD-10-CM

## 2020-02-09 DIAGNOSIS — F0390 Unspecified dementia without behavioral disturbance: Secondary | ICD-10-CM

## 2020-02-09 DIAGNOSIS — J438 Other emphysema: Secondary | ICD-10-CM

## 2020-02-09 NOTE — Progress Notes (Signed)
Location:   [facility] Room Number: 911 Place of Service:  ALF [phone]) Provider:  Sharad Vaneaton, Lennie Odor, NP  [name], MD  Patient Care Team: [name], MD as PCP - General (Internal Medicine) [name], MD as Consulting Physician (Ophthalmology) [name], MD as Consulting Physician (Cardiology) [name], MD as Consulting Physician (Dermatology) [name], MD as Consulting Physician (Orthopedic Surgery) [name], MD as Consulting Physician (Orthopedic Surgery) [name], MD as Consulting Physician (Orthopedic Surgery) Rocklin, Crenshaw Nicholous Girgenti X, NP as Nurse Practitioner (Nurse Practitioner) [name], MD as Consulting Physician (Neurology)  Extended Emergency Contact Information Primary Emergency Contact: [name],[name] Address: [address]          [place] [address] [place] Phone: [phone] Relation: Sister Secondary Emergency Contact: [name] [place] Phone: [phone] Relation: Sister  Code Status:  DNR Goals of care: Advanced Directive information Advanced Directives 02/09/2020  Does Patient Have a Medical Advance Directive? Yes  Type of Advance Directive Out of facility DNR (pink MOST or yellow form)  Does patient want to make changes to medical advance directive? No - Patient declined  Copy of [place] in Chart? -  Pre-existing out of facility DNR order (yellow form or pink MOST form) Yellow form placed in chart (order not valid for inpatient use);Pink MOST form placed in chart (order not valid for inpatient use)     Chief Complaint  Patient presents with   Medical Management of Chronic Issues    Routine follow up visit.   Quality Metric Gaps    Mammogram    HPI:  Pt is a 86 y.o. female seen today for medical management of chronic diseases.    Restless leg symptom, takes MiraPex 0.5mg  hs              Hx of  dementia, resides in [facility], ambulates with walker, on Memantine 10mg  bid.              Hx of seizures, stable, on Keppra 500mg  qhs, 250mg  qd, Lamictal 150mg  bid.              Edema, trace, on Furosemide 20mg  qd, Bun/creat 34/0.89 01/11/20             OA in general, on Tylenol 650mg  qd.  Olecranon bursitis, 01/19/20 Ortho limit use LUE as tolerated. R elbow olecranon bursitis, uses pad prn  HTN, takes Amlodipine.   COPD, takes Spiriva qd, prn Albuterol HFA  Constipation, takes Senokot, MiraLax.     Past Medical History:  Diagnosis Date   Acute bronchitis 05/23/2011   Acute upper respiratory infections of unspecified site 05/23/2011   Arthritis    Chronic airway obstruction, not elsewhere classified 05/23/2011   Disturbance of salivary secretion 01/31/2011   Dizziness and giddiness 01/31/2011   Essential tremor 04/25/2014   External hemorrhoids without mention of complication 26/94/8546   Gait disorder 04/25/2014   Insomnia, unspecified 09/12/2011   Lumbago 01/31/2011   Major depressive disorder, single episode, unspecified 01/31/2011   Memory disorder 04/25/2014   Mitral valve disorders(424.0) 01/31/2011   Other and unspecified hyperlipidemia 01/31/2011   Other convulsions 01/31/2011   Other emphysema ([place]) 01/31/2011   Pain in joint, site unspecified 01/31/2011   Restless legs syndrome (RLS) 09/12/2011   Retinal detachment with retinal defect of right eye 2011   right eye twice   Senile osteoporosis 01/31/2011   Spontaneous  ecchymoses 01/31/2011   Stiffness of joints, not elsewhere classified, multiple sites 01/31/2011   Unspecified essential hypertension 01/31/2011   Past Surgical History:  Procedure Laterality Date   ABDOMINAL HYSTERECTOMY  06/21/2003   TAH/BSO, omenectomy PSB resect, Stg IC cystadenofibroma   CHOLECYSTECTOMY  2005   Dr. Marlou Starks   ELBOW SURGERY Right 2008   broken   Dr. Apolonio Schneiders   EYE SURGERY     RETINAL DETACHMENT SURGERY N/A    two    REVERSE SHOULDER ARTHROPLASTY Left 05/06/2019   Procedure: REVERSE SHOULDER ARTHROPLASTY;  Surgeon: Justice Britain, MD;  Location: [facility];  Service: Orthopedics;  Laterality: Left;  196min   ROTATOR CUFF REPAIR Right 2012   Dr. Theda Sers   SQUAMOUS CELL CARCINOMA EXCISION Bilateral 2012, 8/14   [place] on legs   Dr. Sarajane Jews   TONSILLECTOMY  1941   VIDEO BRONCHOSCOPY WITH ENDOBRONCHIAL NAVIGATION N/A 11/29/2015   Procedure: VIDEO BRONCHOSCOPY WITH ENDOBRONCHIAL NAVIGATION;  Surgeon: Collene Gobble, MD;  Location: [facility];  Service: Thoracic;  Laterality: N/A;    Allergies  Allergen Reactions   Latex Other (See Comments)    Swelling   Sulfa Antibiotics Nausea And Vomiting   Tetracyclines & Related     Allergies as of 02/09/2020      Reactions   Latex Other (See Comments)   Swelling   Sulfa Antibiotics Nausea And Vomiting   Tetracyclines & Related       Medication List       Accurate as of February 09, 2020 11:59 PM. If you have any questions, ask your nurse or doctor.        STOP taking these medications   mupirocin ointment 2 % Commonly known as: BACTROBAN Stopped by: Lenee Franze X Carrin Vannostrand, NP     TAKE these medications   albuterol 108 (90 Base) MCG/ACT inhaler Commonly known as: VENTOLIN HFA Inhale 2 puffs into the lungs every 4 (four) hours as needed for wheezing or shortness of breath.   amLODipine 2.5 MG tablet Commonly known as: NORVASC Take 2.5 mg by mouth daily.   aspirin 81 MG tablet Take 81 mg by mouth daily.   cholecalciferol 1000 units tablet Commonly known as: VITAMIN D Take 1 tablet (1,000 Units total) by mouth daily.   furosemide 20 MG tablet Commonly known as: LASIX Take 20 mg by mouth daily.   hydrocortisone 2.5 % cream Apply 1 application topically as needed (for psoriasis).   ketoconazole 2 % cream Commonly known as: NIZORAL Apply 1 application topically as needed (for psoriasis).   lamoTRIgine 150 MG tablet Commonly known as: LAMICTAL TAKE  1 TABLET BY MOUTH TWICE DAILY.   levETIRAcetam 500 MG tablet Commonly known as: KEPPRA Take 500 mg by mouth at bedtime.   levETIRAcetam 250 MG tablet Commonly known as: KEPPRA Take 250 mg by mouth daily.   memantine 10 MG tablet Commonly known as: NAMENDA TAKE 1 TABLET BY MOUTH TWICE DAILY.   naproxen sodium 220 MG tablet Commonly known as: ALEVE Take 220 mg by mouth daily as needed.   polyethylene glycol 17 g packet Commonly known as: MIRALAX / GLYCOLAX Take 17 g by mouth at bedtime.   Potassium Chloride ER 20 MEQ Tbcr Take 20 mEq by mouth daily.   pramipexole 0.5 MG tablet Commonly known as: MIRAPEX Take 0.5 mg by mouth at bedtime.   senna 8.6 MG tablet Commonly known as: SENOKOT Take 1 tablet by mouth daily.   simvastatin 10 MG tablet Commonly known as: ZOCOR  Take 10 mg by mouth daily. Reported on 06/27/2015   tiotropium 18 MCG inhalation capsule Commonly known as: Spiriva HandiHaler INHALE CONTENTS OF ONE CAPSULE ONCE DAILY FOR COPD.       Review of Systems  Constitutional: Negative for activity change, fever and unexpected weight change.  HENT: Positive for hearing loss. Negative for congestion and voice change.   Eyes: Negative for visual disturbance.  Respiratory: Positive for shortness of breath. Negative for cough and wheezing.        DOE is chronic  Cardiovascular: Negative for chest pain, palpitations and leg swelling.  Gastrointestinal: Negative for abdominal distention and abdominal pain.  Genitourinary: Negative for difficulty urinating, dysuria and urgency.  Musculoskeletal: Positive for arthralgias and gait problem.       Tip of the right elbow swelling.   Skin: Positive for rash. Negative for color change.       BLE discoloration is chronic.  Neurological: Positive for seizures. Negative for dizziness, tremors, speech difficulty and headaches.       Memory lapses. Hx of seizures. RLS  Psychiatric/Behavioral: Negative for behavioral problems  and sleep disturbance. The patient is not nervous/anxious.     Immunization History  Administered Date(s) Administered   Influenza Split 01/06/2014   Influenza Whole 01/07/2012, 01/06/2013   Influenza, High Dose Seasonal PF 12/25/2015, 01/20/2017   Influenza,inj,Quad PF,6+ Mos 12/21/2014   Influenza-Unspecified 12/09/2018, 01/18/2020   Moderna SARS-COVID-2 Vaccination 04/12/2019, 06/05/2019   Pneumococcal Conjugate-13 02/01/2014   Pneumococcal Polysaccharide-23 04/08/2004   Td 04/08/2002   Tdap 04/09/2011   Zoster 04/08/2008   Zoster Recombinat (Shingrix) 08/27/2017   Pertinent  Health Maintenance Due  Topic Date Due   MAMMOGRAM  06/14/2015   INFLUENZA VACCINE  Completed   DEXA SCAN  Completed   PNA vac Low Risk Adult  Completed   Fall Risk  03/24/2014 10/15/2012  Falls in the past year? No No   Functional Status Survey:    Vitals:   02/09/20 0833  BP: 128/66  Pulse: 80  Resp: 20  Temp: (!) 97.4 F (36.3 C)  SpO2: 93%  Weight: 165 lb (74.8 kg)  Height: 5\' 5"  (1.651 m)   Body mass index is 27.46 kg/m. Physical Exam Vitals and nursing note reviewed.  Constitutional:      Appearance: Normal appearance.  HENT:     Head: Normocephalic and atraumatic.     Mouth/Throat:     Mouth: Mucous membranes are moist.  Eyes:     General:        Right eye: Hordeolum present.     Extraocular Movements: Extraocular movements intact.     Conjunctiva/sclera:     Right eye: Right conjunctiva is not injected.     Left eye: Left conjunctiva is not injected.     Pupils: Pupils are equal, round, and reactive to light.     Comments: Mid right upper eyelid style is healing   Cardiovascular:     Rate and Rhythm: Normal rate and regular rhythm.     Heart sounds: Murmur heard.      Comments: PD pulses are not felt.  Pulmonary:     Breath sounds: Rales present.     Comments: Decreased air entry to both lungs. Bibasilar rales.  Abdominal:     General: Bowel sounds  are normal.     Palpations: Abdomen is soft.     Tenderness: There is no abdominal tenderness.     Comments: Mid abd surgical scar  Musculoskeletal:  Cervical back: Normal range of motion and neck supple.     Right lower leg: Edema present.     Left lower leg: Edema present.     Comments:  Decreased overhead ROM of the left shoulder. Right elbow olecranon bursa effusion about a ping pong ball size. BLE edema trace to 1+ is chronic.   Skin:    General: Skin is warm and dry.     Comments: Brownish skin discoloration BLE.rash in the right groin area.   Neurological:     General: No focal deficit present.     Mental Status: She is alert and oriented to person, place, and time. Mental status is at baseline.     Motor: No weakness.     Coordination: Coordination normal.     Gait: Gait abnormal.  Psychiatric:        Mood and Affect: Mood normal.        Behavior: Behavior normal.        Thought Content: Thought content normal.        Judgment: Judgment normal.     Labs reviewed: Recent Labs    05/08/19 0327 05/08/19 0327 05/09/19 4196 05/09/19 2229 05/10/19 0341 05/10/19 0341 05/12/19 0334 05/12/19 0334 05/13/19 0403 05/20/19 0000 06/29/19 0000 10/27/19 0000 01/11/20 0000  NA 138   < > 138   < > 139   < > 139   < > 138   < > 141 141 143  K 3.6   < > 3.3*   < > 3.5   < > 3.3*   < > 3.6   < > 4.0 4.2 3.9  CL 103   < > 100   < > 104   < > 104   < > 105   < > 106 105 106  CO2 26   < > 30   < > 28   < > 25   < > 24   < > 27* 25* 27*  GLUCOSE 106*   < > 104*   < > 99  --  97  --  101*  --   --   --   --   BUN 17   < > 15   < > 13   < > 19   < > 16   < > 15 27* 34*  CREATININE 0.48   < > 0.52   < > 0.55   < > 0.62   < > 0.60   < > 0.7 1.0 0.9  CALCIUM 8.3*   < > 8.4*   < > 8.7*   < > 8.7*   < > 8.4*   < > 9.2 9.6 9.9  MG 1.8  --  1.8  --  2.0  --   --   --   --   --   --   --   --   PHOS 2.1*  --  3.3  --  3.0  --   --   --   --   --   --   --   --    < > = values in this  interval not displayed.   Recent Labs    05/08/19 0327 05/08/19 0327 05/09/19 0338 05/09/19 0338 05/10/19 0341 05/10/19 0341 05/20/19 0000 06/29/19 0000 01/11/20 0000  AST 29   < > 26   < > 25   < > 16 14 16   ALT 23   < > 22   < >  21   < > 15 7 11   ALKPHOS 106   < > 97   < > 91   < > 254* 129* 109  BILITOT 1.5*  --  1.1  --  1.4*  --   --   --   --   PROT 5.7*  --  5.6*  --  5.5*  --   --   --   --   ALBUMIN 3.1*   < > 2.9*   < > 2.9*   < > 3.5 4.0 4.5   < > = values in this interval not displayed.   Recent Labs    05/04/19 0025 05/04/19 0449 05/11/19 0422 05/11/19 0422 05/12/19 0334 05/12/19 0334 05/13/19 0403 05/13/19 0403 05/20/19 0000 06/29/19 0000 01/11/20 0000  WBC 13.3*   < > 7.6   < > 7.4   < > 8.3  --  5.9 4.9 5.1  NEUTROABS 11.3*  --   --   --   --   --   --   --  4,260  --  2,953.00  HGB 13.6   < > 10.8*   < > 10.8*   < > 11.3*   < > 12.1 13.3 14.5  HCT 41.6   < > 33.4*   < > 33.6*   < > 36.2   < > 36 40 44  MCV 93.7   < > 95.4  --  95.2  --  97.1  --   --   --   --   PLT 198   < > 220   < > 247   < > 275   < > 364 251 249   < > = values in this interval not displayed.   Lab Results  Component Value Date   TSH 1.79 06/29/2019   No results found for: HGBA1C Lab Results  Component Value Date   CHOL 164 10/12/2019   HDL 67 10/12/2019   LDLCALC 82 10/12/2019   TRIG 70 10/12/2019   CHOLHDL 3.6 06/27/2015    Significant Diagnostic Results in last 30 days:  No results found.  Assessment/Plan Olecranon bursitis of right elbow 01/19/20 Ortho limit use LUE as tolerated. R elbow olecranon bursitis, uses pad prn   Slow transit constipation Constipation, takes Senokot, MiraLax.    COPD (chronic obstructive pulmonary disease) (HCC) COPD, takes Spiriva qd, prn Albuterol HFA   Essential hypertension Blood pressure is controlled, continue Amlodipine.   Edema Edema, trace, on Furosemide 20mg  qd, Bun/creat 34/0.89 01/11/20   Osteoarthritis of  back OA in general, on Tylenol 650mg  qd.   Seizure disorder (HCC) Hx of seizures, stable, on Keppra 500mg  qhs, 250mg  qd, Lamictal 150mg  bid.    Senile dementia (HCC) Hx of dementia, resides in [facility], ambulates with walker, on Memantine 10mg  bid.    Restless legs syndrome (RLS) Restless leg symptom, takes MiraPex 0.5mg  hs   Candidiasis of skin Right groin, assist the patient to keep the area dry and clean, apply Nystatin powder bid x 10 days, then prn.      Family/ staff Communication: plan of care reviewed with the patient and charge nurse.   Labs/tests ordered: none  Time spend 40 minutes.

## 2020-02-09 NOTE — Assessment & Plan Note (Signed)
OA in general, on Tylenol 650mg qd.  

## 2020-02-09 NOTE — Assessment & Plan Note (Signed)
Edema, trace, on Furosemide 20mg  qd, Bun/creat 34/0.89 01/11/20

## 2020-02-09 NOTE — Assessment & Plan Note (Signed)
Blood pressure is controlled, continue Amlodipine.  

## 2020-02-09 NOTE — Assessment & Plan Note (Signed)
COPD, takes Spiriva qd, prn Albuterol HFA

## 2020-02-09 NOTE — Assessment & Plan Note (Signed)
Hx of seizures, stable, on Keppra 500mg qhs, 250mg qd, Lamictal 150mg bid.  

## 2020-02-09 NOTE — Assessment & Plan Note (Signed)
01/19/20 Ortho limit use LUE as tolerated. R elbow olecranon bursitis, uses pad prn  

## 2020-02-09 NOTE — Assessment & Plan Note (Signed)
Restless leg symptom, takes MiraPex 0.5mg hs 

## 2020-02-09 NOTE — Assessment & Plan Note (Signed)
Constipation, takes Senokot, MiraLax 

## 2020-02-09 NOTE — Assessment & Plan Note (Signed)
Hx of dementia, resides in [facility], ambulates with walker, on Memantine 10mg  bid.

## 2020-02-10 ENCOUNTER — Encounter: Payer: Self-pay | Admitting: Nurse Practitioner

## 2020-02-10 DIAGNOSIS — B372 Candidiasis of skin and nail: Secondary | ICD-10-CM | POA: Insufficient documentation

## 2020-02-10 NOTE — Assessment & Plan Note (Signed)
Right groin, assist the patient to keep the area dry and clean, apply Nystatin powder bid x 10 days, then prn.

## 2020-02-22 ENCOUNTER — Ambulatory Visit: Payer: Self-pay | Admitting: Neurology

## 2020-02-28 DIAGNOSIS — I8311 Varicose veins of right lower extremity with inflammation: Secondary | ICD-10-CM | POA: Diagnosis not present

## 2020-02-28 DIAGNOSIS — I8312 Varicose veins of left lower extremity with inflammation: Secondary | ICD-10-CM | POA: Diagnosis not present

## 2020-02-28 DIAGNOSIS — I872 Venous insufficiency (chronic) (peripheral): Secondary | ICD-10-CM | POA: Diagnosis not present

## 2020-02-28 DIAGNOSIS — Z85828 Personal history of other malignant neoplasm of skin: Secondary | ICD-10-CM | POA: Diagnosis not present

## 2020-02-28 DIAGNOSIS — L821 Other seborrheic keratosis: Secondary | ICD-10-CM | POA: Diagnosis not present

## 2020-03-07 ENCOUNTER — Encounter: Payer: Self-pay | Admitting: Internal Medicine

## 2020-03-07 ENCOUNTER — Non-Acute Institutional Stay: Payer: Medicare PPO | Admitting: Internal Medicine

## 2020-03-07 DIAGNOSIS — R413 Other amnesia: Secondary | ICD-10-CM

## 2020-03-07 DIAGNOSIS — J438 Other emphysema: Secondary | ICD-10-CM | POA: Diagnosis not present

## 2020-03-07 DIAGNOSIS — I10 Essential (primary) hypertension: Secondary | ICD-10-CM

## 2020-03-07 DIAGNOSIS — R600 Localized edema: Secondary | ICD-10-CM

## 2020-03-07 DIAGNOSIS — G40909 Epilepsy, unspecified, not intractable, without status epilepticus: Secondary | ICD-10-CM

## 2020-03-07 DIAGNOSIS — G2581 Restless legs syndrome: Secondary | ICD-10-CM | POA: Diagnosis not present

## 2020-03-07 NOTE — Progress Notes (Signed)
Location:   [facility] Room Number: [place] of Service:  ALF [phone]) Provider:  Veleta Miners MD  [name], MD  Patient Care Team: [name], MD as PCP - General (Internal Medicine) [name], MD as Consulting Physician (Ophthalmology) [name], MD as Consulting Physician (Cardiology) [name], MD as Consulting Physician (Dermatology) [name], MD as Consulting Physician (Orthopedic Surgery) [name], MD as Consulting Physician (Orthopedic Surgery) [name], MD as Consulting Physician (Orthopedic Surgery) [place], [place] Mast, Man X, NP as Nurse Practitioner (Nurse Practitioner) [name], MD as Consulting Physician (Neurology)  Extended Emergency Contact Information Primary Emergency Contact: [name],[name] Address: [address]          [place] [address] [place] Phone: [phone] Relation: Sister Secondary Emergency Contact: [name] [place] Phone: [phone] Relation: Sister  Code Status:  DNR Goals of care: Advanced Directive information Advanced Directives 02/09/2020  Does Patient Have a Medical Advance Directive? Yes  Type of Advance Directive Out of facility DNR (pink MOST or yellow form)  Does patient want to make changes to medical advance directive? No - Patient declined  Copy of [place] in Chart? -  Pre-existing out of facility DNR order (yellow form or pink MOST form) Yellow form placed in chart (order not valid for inpatient use);Pink MOST form placed in chart (order not valid for inpatient use)     Chief Complaint  Patient presents with  . Acute Visit    LE edema    HPI:  Pt is a 86 y.o. female seen today for an acute visit for Worsening LE edema per Dermatology.  Patientalsohas h/o COPD, Hypertension,Seizure Disorder,Mild Cognitive impairment, Hyperlipidemiaand fall in 1/21 leading  toLeft Humerus Fracture with Displacement needing Arthroplasty and , Right Mandibular Fracture  She stays in AL and walks with her walker  Patient had non  Healing ulcer on her Left leg and dermatology wanted her edema to be controlled better. The wound is now healed but she continues to have redness and edema in Lower Extremities with itching in both legs.  She was prescribed Triamcinolone cream with Susy Frizzle by them and it has helped. No other issues. Weigh tis stable No coughing. Has SOB on exertion   Past Medical History:  Diagnosis Date  . Acute bronchitis 05/23/2011  . Acute upper respiratory infections of unspecified site 05/23/2011  . Arthritis   . Chronic airway obstruction, not elsewhere classified 05/23/2011  . Disturbance of salivary secretion 01/31/2011  . Dizziness and giddiness 01/31/2011  . Essential tremor 04/25/2014  . External hemorrhoids without mention of complication 75/64/3329  . Gait disorder 04/25/2014  . Insomnia, unspecified 09/12/2011  . Lumbago 01/31/2011  . Major depressive disorder, single episode, unspecified 01/31/2011  . Memory disorder 04/25/2014  . Mitral valve disorders(424.0) 01/31/2011  . Other and unspecified hyperlipidemia 01/31/2011  . Other convulsions 01/31/2011  . Other emphysema ([place]) 01/31/2011  . Pain in joint, site unspecified 01/31/2011  . Restless legs syndrome (RLS) 09/12/2011  . Retinal detachment with retinal defect of right eye 2011   right eye twice  . Senile osteoporosis 01/31/2011  . Spontaneous ecchymoses 01/31/2011  . Stiffness of joints, not elsewhere classified, multiple sites 01/31/2011  . Unspecified essential hypertension 01/31/2011   Past Surgical History:  Procedure Laterality Date  . ABDOMINAL HYSTERECTOMY  06/21/2003   TAH/BSO, omenectomy PSB resect, Stg IC cystadenofibroma  . CHOLECYSTECTOMY  2005  Dr. Marlou Starks  . ELBOW SURGERY Right 2008   broken   Dr. Apolonio Schneiders  . EYE SURGERY    . RETINAL DETACHMENT SURGERY N/A     two  . REVERSE SHOULDER ARTHROPLASTY Left 05/06/2019   Procedure: REVERSE SHOULDER ARTHROPLASTY;  Surgeon: Justice Britain, MD;  Location: [facility];  Service: Orthopedics;  Laterality: Left;  193min  . ROTATOR CUFF REPAIR Right 2012   Dr. Theda Sers  . SQUAMOUS CELL CARCINOMA EXCISION Bilateral 2012, 8/14   [place] on legs   Dr. Sarajane Jews  . TONSILLECTOMY  1941  . VIDEO BRONCHOSCOPY WITH ENDOBRONCHIAL NAVIGATION N/A 11/29/2015   Procedure: VIDEO BRONCHOSCOPY WITH ENDOBRONCHIAL NAVIGATION;  Surgeon: Collene Gobble, MD;  Location: [facility];  Service: Thoracic;  Laterality: N/A;    Allergies  Allergen Reactions  . Latex Other (See Comments)    Swelling  . Sulfa Antibiotics Nausea And Vomiting  . Tetracyclines & Related     Allergies as of 03/07/2020      Reactions   Latex Other (See Comments)   Swelling   Sulfa Antibiotics Nausea And Vomiting   Tetracyclines & Related       Medication List       Accurate as of March 07, 2020  2:39 PM. If you have any questions, ask your nurse or doctor.        albuterol 108 (90 Base) MCG/ACT inhaler Commonly known as: VENTOLIN HFA Inhale 2 puffs into the lungs every 4 (four) hours as needed for wheezing or shortness of breath.   amLODipine 2.5 MG tablet Commonly known as: NORVASC Take 2.5 mg by mouth daily.   aspirin 81 MG tablet Take 81 mg by mouth daily.   CeraVe Crea Apply topically daily. Mix cream with Triamcinolone   cholecalciferol 1000 units tablet Commonly known as: VITAMIN D Take 1 tablet (1,000 Units total) by mouth daily.   furosemide 20 MG tablet Commonly known as: LASIX Take 20 mg by mouth daily.   hydrocortisone 2.5 % cream Apply 1 application topically as needed (for psoriasis).   ketoconazole 2 % cream Commonly known as: NIZORAL Apply 1 application topically as needed (for psoriasis).   lamoTRIgine 150 MG tablet Commonly known as: LAMICTAL TAKE 1 TABLET BY MOUTH TWICE DAILY.   levETIRAcetam 500 MG tablet Commonly  known as: KEPPRA Take 500 mg by mouth at bedtime.   levETIRAcetam 250 MG tablet Commonly known as: KEPPRA Take 250 mg by mouth daily.   memantine 10 MG tablet Commonly known as: NAMENDA TAKE 1 TABLET BY MOUTH TWICE DAILY.   naproxen sodium 220 MG tablet Commonly known as: ALEVE Take 220 mg by mouth daily as needed.   nystatin powder Commonly known as: MYCOSTATIN/NYSTOP Apply 1 application topically 2 (two) times daily as needed.   polyethylene glycol 17 g packet Commonly known as: MIRALAX / GLYCOLAX Take 17 g by mouth at bedtime.   Potassium Chloride ER 20 MEQ Tbcr Take 20 mEq by mouth daily.   pramipexole 0.5 MG tablet Commonly known as: MIRAPEX Take 0.5 mg by mouth at bedtime.   senna 8.6 MG tablet Commonly known as: SENOKOT Take 1 tablet by mouth daily.   simvastatin 10 MG tablet Commonly known as: ZOCOR Take 10 mg by mouth daily. Reported on 06/27/2015   tiotropium 18 MCG inhalation capsule Commonly known as: Spiriva HandiHaler INHALE CONTENTS OF ONE CAPSULE ONCE DAILY FOR COPD.   triamcinolone 0.1 % Commonly known as: KENALOG Apply 1 application topically daily.  Review of Systems  Constitutional: Negative for activity change and unexpected weight change.  HENT: Negative.   Respiratory: Positive for shortness of breath.   Cardiovascular: Positive for leg swelling.  Gastrointestinal: Negative.   Genitourinary: Negative.   Musculoskeletal: Negative.   Skin: Positive for rash.  Neurological: Negative for dizziness.  Psychiatric/Behavioral: Negative.     Immunization History  Administered Date(s) Administered  . Influenza Split 01/06/2014  . Influenza Whole 01/07/2012, 01/06/2013  . Influenza, High Dose Seasonal PF 12/25/2015, 01/20/2017  . Influenza,inj,Quad PF,6+ Mos 12/21/2014  . Influenza-Unspecified 12/09/2018, 01/18/2020  . Moderna SARS-COVID-2 Vaccination 04/12/2019, 06/05/2019  . Pneumococcal Conjugate-13 02/01/2014  . Pneumococcal  Polysaccharide-23 04/08/2004  . Td 04/08/2002  . Tdap 04/09/2011  . Zoster 04/08/2008  . Zoster Recombinat (Shingrix) 08/27/2017   Pertinent  Health Maintenance Due  Topic Date Due  . MAMMOGRAM  06/14/2015  . INFLUENZA VACCINE  Completed  . DEXA SCAN  Completed  . PNA vac Low Risk Adult  Completed   Fall Risk  03/24/2014 10/15/2012  Falls in the past year? No No   Functional Status Survey:    Vitals:   03/07/20 1316  BP: 128/76  Pulse: 80  Resp: 20  Temp: (!) 97.2 F (36.2 C)  SpO2: 93%  Weight: 165 lb (74.8 kg)  Height: 5' 5.5" (1.664 m)   Body mass index is 27.04 kg/m. Physical Exam  Constitutional: Oriented to person, place, and time. Well-developed and well-nourished.  HENT:  Head: Normocephalic.  Mouth/Throat: Oropharynx is clear and moist.  Eyes: Pupils are equal, round, and reactive to light.  Neck: Neck supple.  Cardiovascular: Normal rate and normal heart sounds.  No murmur heard. Pulmonary/Chest: Effort normal and breath sounds normal. No respiratory distress. No wheezes. She has no rales.  Abdominal: Soft. Bowel sounds are normal. No distension. There is no tenderness. There is no rebound.  Musculoskeletal: Chronic Venous changes bilateral with Mild Edema Lymphadenopathy: none Neurological: Alert and oriented to person, place, and time.  Skin: Skin is warm and dry.  Psychiatric: Normal mood and affect. Behavior is normal. Thought content normal.    Labs reviewed: Recent Labs    05/08/19 0327 05/08/19 0327 05/09/19 0338 05/09/19 0338 05/10/19 0341 05/10/19 0341 05/12/19 0334 05/12/19 0334 05/13/19 0403 05/20/19 0000 06/29/19 0000 10/27/19 0000 01/11/20 0000  NA 138   < > 138   < > 139   < > 139   < > 138   < > 141 141 143  K 3.6   < > 3.3*   < > 3.5   < > 3.3*   < > 3.6   < > 4.0 4.2 3.9  CL 103   < > 100   < > 104   < > 104   < > 105   < > 106 105 106  CO2 26   < > 30   < > 28   < > 25   < > 24   < > 27* 25* 27*  GLUCOSE 106*   < > 104*    < > 99  --  97  --  101*  --   --   --   --   BUN 17   < > 15   < > 13   < > 19   < > 16   < > 15 27* 34*  CREATININE 0.48   < > 0.52   < > 0.55   < > 0.62   < >  0.60   < > 0.7 1.0 0.9  CALCIUM 8.3*   < > 8.4*   < > 8.7*   < > 8.7*   < > 8.4*   < > 9.2 9.6 9.9  MG 1.8  --  1.8  --  2.0  --   --   --   --   --   --   --   --   PHOS 2.1*  --  3.3  --  3.0  --   --   --   --   --   --   --   --    < > = values in this interval not displayed.   Recent Labs    05/08/19 0327 05/08/19 0327 05/09/19 0338 05/09/19 0338 05/10/19 0341 05/10/19 0341 05/20/19 0000 06/29/19 0000 01/11/20 0000  AST 29   < > 26   < > 25   < > 16 14 16   ALT 23   < > 22   < > 21   < > 15 7 11   ALKPHOS 106   < > 97   < > 91   < > 254* 129* 109  BILITOT 1.5*  --  1.1  --  1.4*  --   --   --   --   PROT 5.7*  --  5.6*  --  5.5*  --   --   --   --   ALBUMIN 3.1*   < > 2.9*   < > 2.9*   < > 3.5 4.0 4.5   < > = values in this interval not displayed.   Recent Labs    05/04/19 0025 05/04/19 0449 05/11/19 0422 05/11/19 0422 05/12/19 0334 05/12/19 0334 05/13/19 0403 05/13/19 0403 05/20/19 0000 06/29/19 0000 01/11/20 0000  WBC 13.3*   < > 7.6   < > 7.4   < > 8.3  --  5.9 4.9 5.1  NEUTROABS 11.3*  --   --   --   --   --   --   --  4,260  --  2,953.00  HGB 13.6   < > 10.8*   < > 10.8*   < > 11.3*   < > 12.1 13.3 14.5  HCT 41.6   < > 33.4*   < > 33.6*   < > 36.2   < > 36 40 44  MCV 93.7   < > 95.4  --  95.2  --  97.1  --   --   --   --   PLT 198   < > 220   < > 247   < > 275   < > 364 251 249   < > = values in this interval not displayed.   Lab Results  Component Value Date   TSH 1.79 06/29/2019   No results found for: HGBA1C Lab Results  Component Value Date   CHOL 164 10/12/2019   HDL 67 10/12/2019   LDLCALC 82 10/12/2019   TRIG 70 10/12/2019   CHOLHDL 3.6 06/27/2015    Significant Diagnostic Results in last 30 days:  No results found.  Assessment/Plan Bilateral leg edema with Chronic Venous  changes Change Lasix to 40 mg QD incrase the Potassium to 40 meq D/W the patient that this is not going to respond much to diuretics and cause side effects on higher doses Repeat BMP in 1 week  Essential hypertension Doing well on low dose of Norvasc Restless  legs syndrome (RLS) On Mirapex Seizure disorder (HCC) Stable on kepprra Memory disorder On Namenda Very highly functional Other emphysema ([place]) On Spiriva  Senile osteoporosis Fosamax Was stopped due to her request  Hyperlipidemia On Zocor LDL 82 in 7/21    Family/ staff Communication:   Labs/tests ordered:  Bmp in 1 week

## 2020-03-14 DIAGNOSIS — I10 Essential (primary) hypertension: Secondary | ICD-10-CM | POA: Diagnosis not present

## 2020-03-15 LAB — BASIC METABOLIC PANEL
BUN: 35 — AB (ref 4–21)
CO2: 25 — AB (ref 13–22)
Chloride: 106 (ref 99–108)
Creatinine: 0.9 (ref 0.5–1.1)
Glucose: 77
Potassium: 4.4 (ref 3.4–5.3)
Sodium: 143 (ref 137–147)

## 2020-03-15 LAB — COMPREHENSIVE METABOLIC PANEL
Calcium: 9.6 (ref 8.7–10.7)
GFR calc Af Amer: 68
GFR calc non Af Amer: 59

## 2020-03-28 DIAGNOSIS — L84 Corns and callosities: Secondary | ICD-10-CM | POA: Diagnosis not present

## 2020-03-28 DIAGNOSIS — L602 Onychogryphosis: Secondary | ICD-10-CM | POA: Diagnosis not present

## 2020-03-28 DIAGNOSIS — M79672 Pain in left foot: Secondary | ICD-10-CM | POA: Diagnosis not present

## 2020-03-28 DIAGNOSIS — M79671 Pain in right foot: Secondary | ICD-10-CM | POA: Diagnosis not present

## 2020-04-26 ENCOUNTER — Encounter: Payer: Self-pay | Admitting: Nurse Practitioner

## 2020-04-26 ENCOUNTER — Emergency Department (HOSPITAL_COMMUNITY)
Admission: EM | Admit: 2020-04-26 | Discharge: 2020-04-27 | Disposition: A | Discharge status: Home / Self Care | Payer: Medicare PPO | Attending: Emergency Medicine | Admitting: Emergency Medicine

## 2020-04-26 ENCOUNTER — Non-Acute Institutional Stay: Payer: Medicare PPO | Admitting: Nurse Practitioner

## 2020-04-26 ENCOUNTER — Encounter (HOSPITAL_COMMUNITY): Payer: Self-pay | Admitting: Emergency Medicine

## 2020-04-26 ENCOUNTER — Emergency Department (HOSPITAL_COMMUNITY): Payer: Medicare PPO

## 2020-04-26 DIAGNOSIS — M7021 Olecranon bursitis, right elbow: Secondary | ICD-10-CM

## 2020-04-26 DIAGNOSIS — G40909 Epilepsy, unspecified, not intractable, without status epilepticus: Secondary | ICD-10-CM

## 2020-04-26 DIAGNOSIS — Y9301 Activity, walking, marching and hiking: Secondary | ICD-10-CM | POA: Diagnosis not present

## 2020-04-26 DIAGNOSIS — F0390 Unspecified dementia without behavioral disturbance: Secondary | ICD-10-CM

## 2020-04-26 DIAGNOSIS — S82035A Nondisplaced transverse fracture of left patella, initial encounter for closed fracture: Secondary | ICD-10-CM | POA: Insufficient documentation

## 2020-04-26 DIAGNOSIS — W009XXA Unspecified fall due to ice and snow, initial encounter: Secondary | ICD-10-CM | POA: Diagnosis not present

## 2020-04-26 DIAGNOSIS — Z7982 Long term (current) use of aspirin: Secondary | ICD-10-CM | POA: Insufficient documentation

## 2020-04-26 DIAGNOSIS — S82002A Unspecified fracture of left patella, initial encounter for closed fracture: Secondary | ICD-10-CM | POA: Diagnosis not present

## 2020-04-26 DIAGNOSIS — G2581 Restless legs syndrome: Secondary | ICD-10-CM | POA: Diagnosis not present

## 2020-04-26 DIAGNOSIS — S80919A Unspecified superficial injury of unspecified knee, initial encounter: Secondary | ICD-10-CM | POA: Diagnosis not present

## 2020-04-26 DIAGNOSIS — I10 Essential (primary) hypertension: Secondary | ICD-10-CM | POA: Diagnosis not present

## 2020-04-26 DIAGNOSIS — M159 Polyosteoarthritis, unspecified: Secondary | ICD-10-CM

## 2020-04-26 DIAGNOSIS — Z79899 Other long term (current) drug therapy: Secondary | ICD-10-CM | POA: Diagnosis not present

## 2020-04-26 DIAGNOSIS — S8002XA Contusion of left knee, initial encounter: Secondary | ICD-10-CM

## 2020-04-26 DIAGNOSIS — R609 Edema, unspecified: Secondary | ICD-10-CM | POA: Diagnosis not present

## 2020-04-26 DIAGNOSIS — Z85828 Personal history of other malignant neoplasm of skin: Secondary | ICD-10-CM | POA: Insufficient documentation

## 2020-04-26 DIAGNOSIS — J438 Other emphysema: Secondary | ICD-10-CM

## 2020-04-26 DIAGNOSIS — S8992XA Unspecified injury of left lower leg, initial encounter: Secondary | ICD-10-CM | POA: Diagnosis present

## 2020-04-26 DIAGNOSIS — J449 Chronic obstructive pulmonary disease, unspecified: Secondary | ICD-10-CM | POA: Insufficient documentation

## 2020-04-26 DIAGNOSIS — Z91040 Latex allergy status: Secondary | ICD-10-CM | POA: Insufficient documentation

## 2020-04-26 DIAGNOSIS — K5901 Slow transit constipation: Secondary | ICD-10-CM

## 2020-04-26 DIAGNOSIS — Z87891 Personal history of nicotine dependence: Secondary | ICD-10-CM | POA: Diagnosis not present

## 2020-04-26 DIAGNOSIS — Z96612 Presence of left artificial shoulder joint: Secondary | ICD-10-CM | POA: Insufficient documentation

## 2020-04-26 DIAGNOSIS — R52 Pain, unspecified: Secondary | ICD-10-CM | POA: Diagnosis not present

## 2020-04-26 DIAGNOSIS — Y92129 Unspecified place in nursing home as the place of occurrence of the external cause: Secondary | ICD-10-CM | POA: Insufficient documentation

## 2020-04-26 DIAGNOSIS — R0902 Hypoxemia: Secondary | ICD-10-CM | POA: Diagnosis not present

## 2020-04-26 DIAGNOSIS — S82032A Displaced transverse fracture of left patella, initial encounter for closed fracture: Secondary | ICD-10-CM | POA: Diagnosis not present

## 2020-04-26 DIAGNOSIS — M25562 Pain in left knee: Secondary | ICD-10-CM | POA: Diagnosis not present

## 2020-04-26 DIAGNOSIS — R269 Unspecified abnormalities of gait and mobility: Secondary | ICD-10-CM | POA: Diagnosis not present

## 2020-04-26 MED ORDER — OXYCODONE-ACETAMINOPHEN 5-325 MG PO TABS
2.0000 | ORAL_TABLET | Freq: Once | ORAL | Status: AC
  Administered 2020-04-26: 2 via ORAL
  Filled 2020-04-26: qty 2

## 2020-04-26 MED ORDER — OXYCODONE-ACETAMINOPHEN 5-325 MG PO TABS: 1.0000 | ORAL_TABLET | ORAL | 0 refills | Status: DC | PRN

## 2020-04-26 NOTE — Assessment & Plan Note (Signed)
Constipation, takes Senokot, MiraLax 

## 2020-04-26 NOTE — Assessment & Plan Note (Signed)
Hx of seizures, stable, on Keppra 500mg qhs, 250mg qd, Lamictal 150mg bid.  

## 2020-04-26 NOTE — Discharge Instructions (Addendum)
You were evaluated today in the emergency department after your fall at home.  You were found to have a fracture of your left patella, which is your kneecap.  You have been placed in a knee immobilizer which you should wear constantly until you follow-up with the orthopedic surgeon.  You may bear weight as you feel comfortable on the leg, and walk with your walker.  Below is the contact information for Dr. Percell Miller, the orthopedic surgeon. Please be sure to follow-up with him in his office on Friday morning at 8:30 AM. You may call their office to confirm this appointment, however he specified this time to be set aside for your ER follow up.   You have been prescribed a pain medication called percocet which is a combination of a narcotic pain medication and tylenol. Please do not take any further tylenol on top of this.   Return to the ED if you develop and numbness/tingling in your foot, if your pain becomes intolerable or if you develop any other new severe symptoms.

## 2020-04-26 NOTE — Progress Notes (Signed)
Location:   [facility] Room Number: 841 Place of Service:  ALF [phone]) Provider: [facility] Kamea Dacosta NP  [name], MD  Patient Care Team: [name], MD as PCP - General (Internal Medicine) [name], MD as Consulting Physician (Ophthalmology) [name], MD as Consulting Physician (Cardiology) [name], MD as Consulting Physician (Dermatology) [name], MD as Consulting Physician (Orthopedic Surgery) [name], MD as Consulting Physician (Orthopedic Surgery) [name], MD as Consulting Physician (Orthopedic Surgery) [place], [place] Ghazal Pevey X, NP as Nurse Practitioner (Nurse Practitioner) [name], MD as Consulting Physician (Neurology)  Extended Emergency Contact Information Primary Emergency Contact: [name],[name] Address: [address]          [place] [address] [place] Phone: [phone] Relation: Sister Secondary Emergency Contact: [name] [place] Phone: [phone] Relation: Sister  Code Status: DNR Goals of care: Advanced Directive information Advanced Directives 04/26/2020  Does Patient Have a Medical Advance Directive? Yes  Type of Advance Directive Out of facility DNR (pink MOST or yellow form)  Does patient want to make changes to medical advance directive? No - Patient declined  Copy of [place] in Chart? -  Pre-existing out of facility DNR order (yellow form or pink MOST form) Pink MOST form placed in chart (order not valid for inpatient use)     Chief Complaint  Patient presents with  . Anemia    Fall    HPI:  Pt is a 87 y.o. female seen today for an acute visit for the left knee pain sustained from fell on her knee about an hour ago when she tripped over in the bathroom. The patient was  assisted by 2 people to the w/c then  to her recliner. The patient stated her left knee pain with any movement, unable to bear weight,  but able to move her left hip from side to side in recliner with leg rest up. The patient denied dizziness, headache, change of vision, chest pain/pressure, palpitation, or focal weakness, the patient ambulates with walker at her baseline.   Restless leg symptom, takes MiraPex 0.5mg  hs Hx of dementia, resides in [facility], ambulates with walker, on Memantine 10mg  bid.  Hx of seizures, stable, on Keppra 500mg  qhs, 250mg  qd, Lamictal 150mg  bid.  Edema, trace, on Furosemide 20mg  qd, Bun/creat 34/0.89 01/11/20 OA in general, on Tylenol 650mg  qd.             Olecranon bursitis, 01/19/20 Ortho limit use LUE as tolerated. R elbow olecranon bursitis, uses pad prn             HTN, takes Amlodipine.              COPD, takes Spiriva qd, prn Albuterol HFA             Constipation, takes Senokot, MiraLax.    Past Medical History:  Diagnosis Date  . Acute bronchitis 05/23/2011  . Acute upper respiratory infections of unspecified site 05/23/2011  . Arthritis   . Chronic airway obstruction, not elsewhere classified 05/23/2011  . Disturbance of salivary secretion 01/31/2011  . Dizziness and giddiness 01/31/2011  . Essential tremor 04/25/2014  . External hemorrhoids without mention of complication 87/56/4332  . Gait disorder 04/25/2014  . Insomnia, unspecified 09/12/2011  . Lumbago 01/31/2011  . Major depressive disorder, single episode, unspecified 01/31/2011  . Memory disorder 04/25/2014  . Mitral valve disorders(424.0) 01/31/2011  . Other and  unspecified hyperlipidemia 01/31/2011  . Other convulsions 01/31/2011  . Other emphysema ([place]) 01/31/2011  . Pain in joint, site unspecified 01/31/2011  . Restless legs syndrome (RLS) 09/12/2011  . Retinal detachment with retinal defect of right eye 2011   right eye twice  . Senile osteoporosis 01/31/2011  . Spontaneous ecchymoses 01/31/2011  . Stiffness of joints, not elsewhere classified, multiple sites 01/31/2011  .  Unspecified essential hypertension 01/31/2011   Past Surgical History:  Procedure Laterality Date  . ABDOMINAL HYSTERECTOMY  06/21/2003   TAH/BSO, omenectomy PSB resect, Stg IC cystadenofibroma  . CHOLECYSTECTOMY  2005   Dr. Marlou Starks  . ELBOW SURGERY Right 2008   broken   Dr. Apolonio Schneiders  . EYE SURGERY    . RETINAL DETACHMENT SURGERY N/A    two  . REVERSE SHOULDER ARTHROPLASTY Left 05/06/2019   Procedure: REVERSE SHOULDER ARTHROPLASTY;  Surgeon: Justice Britain, MD;  Location: [facility];  Service: Orthopedics;  Laterality: Left;  171min  . ROTATOR CUFF REPAIR Right 2012   Dr. Theda Sers  . SQUAMOUS CELL CARCINOMA EXCISION Bilateral 2012, 8/14   [place] on legs   Dr. Sarajane Jews  . TONSILLECTOMY  1941  . VIDEO BRONCHOSCOPY WITH ENDOBRONCHIAL NAVIGATION N/A 11/29/2015   Procedure: VIDEO BRONCHOSCOPY WITH ENDOBRONCHIAL NAVIGATION;  Surgeon: Collene Gobble, MD;  Location: [facility];  Service: Thoracic;  Laterality: N/A;    Allergies  Allergen Reactions  . Latex Other (See Comments)    Swelling  . Sulfa Antibiotics Nausea And Vomiting  . Tetracyclines & Related     Allergies as of 04/26/2020      Reactions   Latex Other (See Comments)   Swelling   Sulfa Antibiotics Nausea And Vomiting   Tetracyclines & Related       Medication List       Accurate as of April 26, 2020 11:59 PM. If you have any questions, ask your nurse or doctor.        albuterol 108 (90 Base) MCG/ACT inhaler Commonly known as: VENTOLIN HFA Inhale 2 puffs into the lungs every 4 (four) hours as needed for wheezing or shortness of breath.   amLODipine 2.5 MG tablet Commonly known as: NORVASC Take 2.5 mg by mouth daily.   aspirin 81 MG tablet Take 81 mg by mouth daily.   CeraVe Crea Apply topically daily. Mix cream with Triamcinolone   cholecalciferol 1000 units tablet Commonly known as: VITAMIN D Take 1 tablet (1,000 Units total) by mouth daily.   furosemide 40 MG tablet Commonly known as: LASIX Take 40 mg by mouth  daily.   hydrocortisone 2.5 % cream Apply 1 application topically as needed (for psoriasis).   ketoconazole 2 % cream Commonly known as: NIZORAL Apply 1 application topically as needed (for psoriasis).   lamoTRIgine 150 MG tablet Commonly known as: LAMICTAL TAKE 1 TABLET BY MOUTH TWICE DAILY.   levETIRAcetam 500 MG tablet Commonly known as: KEPPRA Take 500 mg by mouth at bedtime.   levETIRAcetam 250 MG tablet Commonly known as: KEPPRA Take 250 mg by mouth daily.   memantine 10 MG tablet Commonly known as: NAMENDA TAKE 1 TABLET BY MOUTH TWICE DAILY.   naproxen sodium 220 MG tablet Commonly known as: ALEVE Take 220 mg by mouth daily as needed.   nystatin powder Commonly known as: MYCOSTATIN/NYSTOP Apply 1 application topically 2 (two) times daily as needed.   oxyCODONE-acetaminophen 5-325 MG tablet Commonly known as: PERCOCET/ROXICET Take 1 tablet by mouth every 4 (four) hours as needed for severe pain.  polyethylene glycol 17 g packet Commonly known as: MIRALAX / GLYCOLAX Take 17 g by mouth at bedtime.   Potassium Chloride ER 20 MEQ Tbcr Take 40 mEq by mouth daily. 2 tablets daily   pramipexole 0.5 MG tablet Commonly known as: MIRAPEX Take 0.5 mg by mouth at bedtime.   senna 8.6 MG tablet Commonly known as: SENOKOT Take 1 tablet by mouth daily.   simvastatin 10 MG tablet Commonly known as: ZOCOR Take 10 mg by mouth daily. Reported on 06/27/2015   tiotropium 18 MCG inhalation capsule Commonly known as: Spiriva HandiHaler INHALE CONTENTS OF ONE CAPSULE ONCE DAILY FOR COPD.   triamcinolone 0.1 % Commonly known as: KENALOG Apply 1 application topically daily.       Review of Systems  Constitutional: Negative for activity change, appetite change and fever.  HENT: Positive for hearing loss. Negative for congestion and voice change.   Eyes: Negative for visual disturbance.  Respiratory: Positive for shortness of breath. Negative for cough and wheezing.         DOE is chronic  Cardiovascular: Positive for leg swelling. Negative for chest pain and palpitations.  Gastrointestinal: Negative for abdominal pain and constipation.       Acid reflux symptoms.   Genitourinary: Negative for difficulty urinating, dysuria and urgency.  Musculoskeletal: Positive for arthralgias, gait problem and joint swelling.       Acute left knee swelling, pain, bruised.   Skin: Negative for color change.       BLE discoloration  Neurological: Positive for seizures. Negative for dizziness, tremors, speech difficulty and headaches.       Memory lapses. Hx of seizures. RLS  Psychiatric/Behavioral: Negative for confusion and sleep disturbance. The patient is not nervous/anxious.     Immunization History  Administered Date(s) Administered  . Influenza Split 01/06/2014  . Influenza Whole 01/07/2012, 01/06/2013  . Influenza, High Dose Seasonal PF 12/25/2015, 01/20/2017  . Influenza,inj,Quad PF,6+ Mos 12/21/2014  . Influenza-Unspecified 12/09/2018, 01/18/2020  . Moderna Sars-Covid-2 Vaccination 04/12/2019, 06/05/2019, 02/15/2020  . Pneumococcal Conjugate-13 02/01/2014  . Pneumococcal Polysaccharide-23 04/08/2004  . Td 04/08/2002  . Tdap 04/09/2011  . Zoster 04/08/2008  . Zoster Recombinat (Shingrix) 08/27/2017   Pertinent  Health Maintenance Due  Topic Date Due  . MAMMOGRAM  06/14/2015  . INFLUENZA VACCINE  Completed  . DEXA SCAN  Completed  . PNA vac Low Risk Adult  Completed   Fall Risk  03/24/2014 10/15/2012  Falls in the past year? No No   Functional Status Survey:    Vitals:   04/26/20 1421  BP: 130/80  Pulse: 70  Resp: 18  Temp: 98.2 F (36.8 C)  SpO2: 94%  Weight: 165 lb (74.8 kg)  Height: 5\' 5"  (1.651 m)   Body mass index is 27.46 kg/m. Physical Exam Vitals and nursing note reviewed.  Constitutional:      Appearance: Normal appearance.  HENT:     Head: Normocephalic and atraumatic.     Mouth/Throat:     Mouth: Mucous membranes are  moist.  Eyes:     General:        Right eye: Hordeolum present.     Extraocular Movements: Extraocular movements intact.     Conjunctiva/sclera:     Right eye: Right conjunctiva is not injected.     Left eye: Left conjunctiva is not injected.     Pupils: Pupils are equal, round, and reactive to light.     Comments: Mid right upper eyelid style is healing  Cardiovascular:     Rate and Rhythm: Normal rate and regular rhythm.     Heart sounds: Murmur heard.      Comments: PD pulses are not felt.  Pulmonary:     Breath sounds: Rales present.     Comments: Decreased air entry to both lungs. Bibasilar rales.  Abdominal:     General: Bowel sounds are normal.     Palpations: Abdomen is soft.     Tenderness: There is no abdominal tenderness.     Comments: Mid abd surgical scar  Musculoskeletal:        General: Swelling and tenderness present.     Cervical back: Normal range of motion and neck supple.     Right lower leg: Edema present.     Left lower leg: Edema present.     Comments:  Decreased overhead ROM of the left shoulder. Right elbow olecranon bursa effusion about a ping pong ball size. BLE edema trace to 1+ is chronic. Left knee swelling and pain with any movement or weight bearing are new.   Skin:    General: Skin is warm and dry.     Comments: Brownish skin discoloration BLE  Neurological:     General: No focal deficit present.     Mental Status: She is alert and oriented to person, place, and time. Mental status is at baseline.     Motor: No weakness.     Coordination: Coordination normal.     Gait: Gait abnormal.  Psychiatric:        Mood and Affect: Mood normal.        Behavior: Behavior normal.        Thought Content: Thought content normal.        Judgment: Judgment normal.     Labs reviewed: Recent Labs    05/08/19 0327 05/09/19 0338 05/10/19 0341 05/12/19 0334 05/13/19 0403 05/20/19 0000 06/29/19 0000 10/27/19 0000 01/11/20 0000  NA 138 138 139 139  138   < > 141 141 143  K 3.6 3.3* 3.5 3.3* 3.6   < > 4.0 4.2 3.9  CL 103 100 104 104 105   < > 106 105 106  CO2 26 30 28 25 24    < > 27* 25* 27*  GLUCOSE 106* 104* 99 97 101*  --   --   --   --   BUN 17 15 13 19 16    < > 15 27* 34*  CREATININE 0.48 0.52 0.55 0.62 0.60   < > 0.7 1.0 0.9  CALCIUM 8.3* 8.4* 8.7* 8.7* 8.4*   < > 9.2 9.6 9.9  MG 1.8 1.8 2.0  --   --   --   --   --   --   PHOS 2.1* 3.3 3.0  --   --   --   --   --   --    < > = values in this interval not displayed.   Recent Labs    05/08/19 0327 05/09/19 0338 05/10/19 0341 05/20/19 0000 06/29/19 0000 01/11/20 0000  AST 29 26 25 16 14 16   ALT 23 22 21 15 7 11   ALKPHOS 106 97 91 254* 129* 109  BILITOT 1.5* 1.1 1.4*  --   --   --   PROT 5.7* 5.6* 5.5*  --   --   --   ALBUMIN 3.1* 2.9* 2.9* 3.5 4.0 4.5   Recent Labs    05/04/19 0025 05/04/19 0449 05/11/19 0422 05/12/19 0334  05/13/19 0403 05/20/19 0000 06/29/19 0000 01/11/20 0000  WBC 13.3*   < > 7.6 7.4 8.3 5.9 4.9 5.1  NEUTROABS 11.3*  --   --   --   --  4,260  --  2,953.00  HGB 13.6   < > 10.8* 10.8* 11.3* 12.1 13.3 14.5  HCT 41.6   < > 33.4* 33.6* 36.2 36 40 44  MCV 93.7   < > 95.4 95.2 97.1  --   --   --   PLT 198   < > 220 247 275 364 251 249   < > = values in this interval not displayed.   Lab Results  Component Value Date   TSH 1.79 06/29/2019   No results found for: HGBA1C Lab Results  Component Value Date   CHOL 164 10/12/2019   HDL 67 10/12/2019   LDLCALC 82 10/12/2019   TRIG 70 10/12/2019   CHOLHDL 3.6 06/27/2015    Significant Diagnostic Results in last 30 days:  DG Knee Complete 4 Views Left  Result Date: 04/26/2020 CLINICAL DATA:  Left knee pain after a fall.  Initial encounter. EXAM: LEFT KNEE - COMPLETE 4+ VIEW COMPARISON:  None. FINDINGS: The patient has a transverse fracture through the mid pole of the patella with distraction fracture fragments of approximately 0.5 cm. No other fracture is identified. Marked soft tissue swelling  is seen anterior to the patella consistent with hemorrhagic bursitis. IMPRESSION: Mildly distracted transverse fracture mid pole of the left patella. Marked soft tissue swelling anterior to the patella consistent with hemorrhagic prepatellar bursitis. Electronically Signed   By: Inge Rise M.D.   On: 04/26/2020 16:03    Assessment/Plan: Contusion of left knee  the left knee pain sustained from fall on her knee about an hour ago when she tripped over in the bathroom. The patient was  assisted by 2 people to the w/c then  to her recliner. The patient stated her left knee pain with any movement, unable to bear weight at all, but able to move her left hip from side to side in recliner with leg rest up. The patient denied dizziness, headache, change of vision, chest pain/pressure, palpitation, or focal weakness, ED to eval to r/o acute fractures.   Gait disorder Ambulates with walker is her baseline, mechanical fall in nature.   Senile dementia (HCC) Hx of dementia, resides in [facility], ambulates with walker, on Memantine 10mg  bid.    Restless legs syndrome (RLS) Restless leg symptom, takes MiraPex 0.5mg  hs  Seizure disorder (HCC) Hx of seizures, stable, on Keppra 500mg  qhs, 250mg  qd, Lamictal 150mg  bid.   Edema Edema, trace, on Furosemide 20mg  qd, Bun/creat 34/0.89 01/11/20   Generalized osteoarthritis of multiple sites OA in general, on Tylenol 650mg  qd.  Olecranon bursitis of right elbow Olecranon bursitis, 01/19/20 Ortho limit use LUE as tolerated. R elbow olecranon bursitis, uses pad prn   Essential hypertension HTN, takes Amlodipine.    COPD (chronic obstructive pulmonary disease) (HCC) COPD, takes Spiriva qd, prn Albuterol HFA   Slow transit constipation Constipation, takes Senokot, MiraLax.    Family/ staff Communication: plan of care reviewed with the patient and charge nurse.   Labs/tests ordered:  None  Time spend 40 minutes.

## 2020-04-26 NOTE — Assessment & Plan Note (Signed)
Olecranon bursitis,01/19/20 Ortho limit use LUE as tolerated. R elbow olecranon bursitis, uses pad prn 

## 2020-04-26 NOTE — ED Provider Notes (Signed)
[name] Provider Note   CSN: [id number] Arrival date & time: 04/26/20  1516     History No chief complaint on file.   [name] is a 87 y.o. female who presents after fall at her assisted living facility.  Patient walks with a walker at baseline, states she was walking to the restroom, however she was wearing shoes that were too big for her and the toe of her shoe got caught underneath of her walker and she fell onto the tile floor.  She denies head trauma, LOC, blurry vision, double vision, nausea, vomiting since the accident.  She endorses pain in her left knee, and nowhere else.  She also endorses inability to flex her knee, as well as swelling.  Patient is neurologically intact and able to provide a clear history.  She lives at the assisted living facility with her sister.  She does have small cut to her left lower lip, which she states is from hitting her lip on her walker.  I personally reviewed this patient's medical records.  History of osteoporosis, COPD, recurrent squamous cell carcinoma, hyperlipidemia, tremors.   HPI     Past Medical History:  Diagnosis Date  . Acute bronchitis 05/23/2011  . Acute upper respiratory infections of unspecified site 05/23/2011  . Arthritis   . Chronic airway obstruction, not elsewhere classified 05/23/2011  . Disturbance of salivary secretion 01/31/2011  . Dizziness and giddiness 01/31/2011  . Essential tremor 04/25/2014  . External hemorrhoids without mention of complication 30/16/0109  . Gait disorder 04/25/2014  . Insomnia, unspecified 09/12/2011  . Lumbago 01/31/2011  . Major depressive disorder, single episode, unspecified 01/31/2011  . Memory disorder 04/25/2014  . Mitral valve disorders(424.0) 01/31/2011  . Other and unspecified hyperlipidemia 01/31/2011  . Other convulsions 01/31/2011  . Other emphysema ([place]) 01/31/2011  . Pain in joint, site unspecified 01/31/2011  . Restless legs  syndrome (RLS) 09/12/2011  . Retinal detachment with retinal defect of right eye 2011   right eye twice  . Senile osteoporosis 01/31/2011  . Spontaneous ecchymoses 01/31/2011  . Stiffness of joints, not elsewhere classified, multiple sites 01/31/2011  . Unspecified essential hypertension 01/31/2011    Patient Active Problem List   Diagnosis Date Noted  . Contusion of left knee 04/26/2020  . Generalized osteoarthritis of multiple sites 10/05/2019  . Olecranon bursitis of right elbow 10/04/2019  . PAD (peripheral artery disease) ([place]) 06/23/2019  . Slow transit constipation 05/21/2019  . Generalized weakness 05/19/2019  . Vasovagal near syncope 05/19/2019  . History of urinary retention 05/19/2019  . Mandibular fracture, closed ([place]) 05/19/2019  . Humerus fracture 05/04/2019  . Right thyroid nodule 02/28/2016  . Abnormal thyroid uptake 11/10/2015  . Coronary artery calcification 12/05/2014  . Panlobular emphysema ([place]) 07/25/2014  . Dyspnea and respiratory abnormality 07/25/2014  . Multiple lung nodules on CT 07/25/2014  . Smoking history 07/25/2014  . Pain of right lower leg 05/23/2014  . Fall 05/23/2014  . Gait disorder 04/25/2014  . Senile dementia ([place]) 04/25/2014  . Essential tremor 04/25/2014  . Advanced directives, counseling/discussion 02/18/2013  . Seizure disorder ([place]) 10/15/2012  . Cancer of skin, squamous cell 10/15/2012  . Unspecified vitamin D deficiency 10/15/2012  . Other and unspecified hyperlipidemia 10/15/2012  . Edema 09/10/2012  . Restless legs syndrome (RLS) 09/12/2011  . Insomnia, unspecified 09/12/2011  . COPD (chronic obstructive pulmonary disease) ([place]) 05/23/2011  . Essential hypertension 01/31/2011  . Senile osteoporosis 01/31/2011  . Major depressive disorder,  single episode 01/31/2011    Past Surgical History:  Procedure Laterality Date  . ABDOMINAL HYSTERECTOMY  06/21/2003   TAH/BSO, omenectomy PSB resect, Stg IC cystadenofibroma  .  CHOLECYSTECTOMY  2005   Dr. Marlou Starks  . ELBOW SURGERY Right 2008   broken   Dr. Apolonio Schneiders  . EYE SURGERY    . RETINAL DETACHMENT SURGERY N/A    two  . REVERSE SHOULDER ARTHROPLASTY Left 05/06/2019   Procedure: REVERSE SHOULDER ARTHROPLASTY;  Surgeon: Justice Britain, MD;  Location: [facility];  Service: Orthopedics;  Laterality: Left;  1102min  . ROTATOR CUFF REPAIR Right 2012   Dr. Theda Sers  . SQUAMOUS CELL CARCINOMA EXCISION Bilateral 2012, 8/14   [place] on legs   Dr. Sarajane Jews  . TONSILLECTOMY  1941  . VIDEO BRONCHOSCOPY WITH ENDOBRONCHIAL NAVIGATION N/A 11/29/2015   Procedure: VIDEO BRONCHOSCOPY WITH ENDOBRONCHIAL NAVIGATION;  Surgeon: Collene Gobble, MD;  Location: [facility] OR;  Service: Thoracic;  Laterality: N/A;     OB History    Gravida  0   Para  0   Term  0   Preterm  0   AB  0   Living  0     SAB  0   IAB  0   Ectopic  0   Multiple  0   Live Births              Family History  Problem Relation Age of Onset  . Heart disease Father        CHF  . Cancer Mother        breast  . Seizures Sister     Social History   Tobacco Use  . Smoking status: Former Smoker    Packs/day: 1.00    Years: 40.00    Pack years: 40.00    Types: Cigarettes    Quit date: 04/08/1988    Years since quitting: 32.0  . Smokeless tobacco: Never Used  . Tobacco comment: quit in 1990  Vaping Use  . Vaping Use: Never used  Substance Use Topics  . Alcohol use: No    Alcohol/week: 0.0 standard drinks  . Drug use: No    Home Medications Prior to Admission medications   Medication Sig Start Date End Date Taking? Authorizing Provider  albuterol (VENTOLIN HFA) 108 (90 Base) MCG/ACT inhaler Inhale 2 puffs into the lungs every 4 (four) hours as needed for wheezing or shortness of breath.    [provider]  amLODipine (NORVASC) 2.5 MG tablet Take 2.5 mg by mouth daily.    [provider]  aspirin 81 MG tablet Take 81 mg by mouth daily.    [provider]   cholecalciferol (VITAMIN D) 1000 units tablet Take 1 tablet (1,000 Units total) by mouth daily. 11/29/15   Collene Gobble, MD  Emollient (CERAVE) CREA Apply topically daily. Mix cream with Triamcinolone    [provider]  furosemide (LASIX) 40 MG tablet Take 40 mg by mouth daily. 05/26/19   [provider]  hydrocortisone 2.5 % cream Apply 1 application topically as needed (for psoriasis).  09/20/15   [provider]  ketoconazole (NIZORAL) 2 % cream Apply 1 application topically as needed (for psoriasis).  09/20/15   [provider]  lamoTRIgine (LAMICTAL) 150 MG tablet TAKE 1 TABLET BY MOUTH TWICE DAILY. 10/21/18   Suzzanne Cloud, NP  levETIRAcetam (KEPPRA) 250 MG tablet Take 250 mg by mouth daily.    [provider]  levETIRAcetam (KEPPRA) 500  MG tablet Take 500 mg by mouth at bedtime.    [provider]  memantine (NAMENDA) 10 MG tablet TAKE 1 TABLET BY MOUTH TWICE DAILY. 01/13/19   Suzzanne Cloud, NP  naproxen sodium (ALEVE) 220 MG tablet Take 220 mg by mouth daily as needed.    [provider]  nystatin (MYCOSTATIN/NYSTOP) powder Apply 1 application topically 2 (two) times daily as needed.    [provider]  oxyCODONE-acetaminophen (PERCOCET/ROXICET) 5-325 MG tablet Take 1 tablet by mouth every 4 (four) hours as needed for severe pain. 04/26/20  Yes Divit Stipp R, PA-C  polyethylene glycol (MIRALAX / GLYCOLAX) 17 g packet Take 17 g by mouth at bedtime.    [provider]  Potassium Chloride ER 20 MEQ TBCR Take 40 mEq by mouth daily. 2 tablets daily 05/26/19   [provider]  pramipexole (MIRAPEX) 0.5 MG tablet Take 0.5 mg by mouth at bedtime.     [provider]  senna (SENOKOT) 8.6 MG tablet Take 1 tablet by mouth daily.    [provider]  simvastatin (ZOCOR) 10 MG tablet Take 10 mg by mouth daily. Reported on 06/27/2015    [provider]  tiotropium (SPIRIVA HANDIHALER)  18 MCG inhalation capsule INHALE CONTENTS OF ONE CAPSULE ONCE DAILY FOR COPD. 10/02/17   Brand Males, MD  triamcinolone (KENALOG) 0.1 % Apply 1 application topically daily.    [provider]    Allergies    Latex, Sulfa antibiotics, and Tetracyclines & related  Review of Systems   Review of Systems  Constitutional: Negative.   HENT: Negative.   Respiratory: Negative.   Cardiovascular: Negative.   Gastrointestinal: Negative.   Genitourinary: Negative.   Musculoskeletal: Positive for arthralgias.       Left knee pain  Skin: Negative.   Neurological: Negative.   Hematological: Negative.   Psychiatric/Behavioral: Negative.     Physical Exam Updated Vital Signs BP 117/75   Pulse 99   Temp 97.9 F (36.6 C) (Oral)   Resp 20   LMP 02/16/1975   SpO2 93%   Physical Exam Vitals and nursing note reviewed.  HENT:     Head: Normocephalic and atraumatic.     Nose: Nose normal.     Mouth/Throat:     Mouth: Mucous membranes are moist.     Pharynx: Oropharynx is clear. Uvula midline. No oropharyngeal exudate or posterior oropharyngeal erythema.     Tonsils: No tonsillar exudate.   Eyes:     General: Lids are normal. Vision grossly intact.        Right eye: No discharge.        Left eye: No discharge.     Extraocular Movements: Extraocular movements intact.     Conjunctiva/sclera: Conjunctivae normal.     Pupils: Pupils are equal, round, and reactive to light.  Neck:     Trachea: Trachea and phonation normal.  Cardiovascular:     Rate and Rhythm: Normal rate and regular rhythm.     Pulses: Normal pulses.          Radial pulses are 2+ on the right side and 2+ on the left side.       Dorsalis pedis pulses are 2+ on the right side and 2+ on the left side.     Heart sounds: Murmur heard.   Systolic murmur is present with a grade of 2/6.   Pulmonary:     Effort: Pulmonary effort is normal. No respiratory distress.  Breath sounds: Normal breath sounds. No  wheezing or rales.  Chest:     Chest wall: No lacerations, deformity, swelling, tenderness, crepitus or edema.  Abdominal:     General: Bowel sounds are normal. There is no distension.     Palpations: Abdomen is soft.     Tenderness: There is no abdominal tenderness. There is no right CVA tenderness or left CVA tenderness.  Musculoskeletal:        General: No deformity.     Right shoulder: Normal.     Left shoulder: Normal.     Right upper arm: Normal.     Left upper arm: Normal.     Right elbow: Normal.     Left elbow: Normal.     Right forearm: Normal.     Left forearm: Normal.     Right wrist: Normal.     Left wrist: Normal.     Right hand: Normal.     Left hand: Normal.     Cervical back: Normal, full passive range of motion without pain, normal range of motion and neck supple. No tenderness or crepitus. No pain with movement, spinous process tenderness or muscular tenderness.     Thoracic back: Normal. No spasms, tenderness or bony tenderness.     Lumbar back: Normal. No spasms, tenderness or bony tenderness.     Right hip: Normal.     Left hip: Normal.     Right upper leg: Normal.     Left upper leg: Normal.     Right knee: Normal.     Left knee: Swelling and ecchymosis present. Tenderness present over the medial joint line.     Right lower leg: Normal. No edema.     Left lower leg: Normal. No edema.     Right ankle: Normal.     Right Achilles Tendon: Normal.     Left ankle: Normal.     Left Achilles Tendon: Normal.     Right foot: Normal.     Left foot: Normal.       Legs:     Comments: 5/5 plantar and dorsiflexion bilaterally.   Lymphadenopathy:     Cervical: No cervical adenopathy.  Skin:    General: Skin is warm and dry.     Capillary Refill: Capillary refill takes less than 2 seconds.  Neurological:     General: No focal deficit present.     Mental Status: She is alert and oriented to person, place, and time. Mental status is at baseline.     Sensory:  Sensation is intact.     Motor: Motor function is intact.  Psychiatric:        Mood and Affect: Mood normal.     ED Results / Procedures / Treatments   Labs (all labs ordered are listed, but only abnormal results are displayed) Labs Reviewed - No data to display  EKG None  Radiology DG Knee Complete 4 Views Left  Result Date: 04/26/2020 CLINICAL DATA:  Left knee pain after a fall.  Initial encounter. EXAM: LEFT KNEE - COMPLETE 4+ VIEW COMPARISON:  None. FINDINGS: The patient has a transverse fracture through the mid pole of the patella with distraction fracture fragments of approximately 0.5 cm. No other fracture is identified. Marked soft tissue swelling is seen anterior to the patella consistent with hemorrhagic bursitis. IMPRESSION: Mildly distracted transverse fracture mid pole of the left patella. Marked soft tissue swelling anterior to the patella consistent with hemorrhagic prepatellar bursitis. Electronically Signed   By:  Inge Rise M.D.   On: 04/26/2020 16:03    Procedures Procedures (including critical care time)  Medications Ordered in ED Medications  oxyCODONE-acetaminophen (PERCOCET/ROXICET) 5-325 MG per tablet 2 tablet (2 tablets Oral Given 04/26/20 1800)    ED Course  I have reviewed the triage vital signs and the nursing notes.  Pertinent labs & imaging results that were available during my care of the patient were reviewed by me and considered in my medical decision making (see chart for details).    MDM Rules/Calculators/A&P                         87 year old female who presents after fall at her assisted living when she tripped over her shoe on her walker and landed on her left knee.  Patient hypertensive on intake to 162/93, vital signs otherwise normal.  Physical exam was very reassuring.  Patient is neurologically intact.  She is tender only over her left patella, which has obviously swelling and bruising.  Analgesia ordered.  Plain film of the  left knee showed mildly distracted transverse fracture mid pole of the left patella with hemorrhagic prepatellar bursitis.  Consult placed to orthopedic surgeon, Dr. Percell Miller, who recommended placing this patient in a knee immobilizer.  She may bear weight on the leg with her walker as tolerated, and to follow-up with him in the office at 830 on Friday morning.  I appreciate his collaboration in the care of this patient.  Given reassuring physical exam, vital signs, no further work-up is warranted in the emergency department at this time.  Will prescribe outpatient pain prescription.  Patient in the immobilizer with normal distal pulses.  Crystol voiced understanding of her medical evaluation and treatment plan.  Each of her questions was answered to her expressed satisfaction.  Return precautions given.  Patient is stable and appropriate for discharge at this time.  This chart was dictated using voice recognition software, Dragon. Despite the best efforts of this provider to proofread and correct errors, errors may still occur which can change documentation meaning.  Final Clinical Impression(s) / ED Diagnoses Final diagnoses:  None    Rx / DC Orders ED Discharge Orders         Ordered    oxyCODONE-acetaminophen (PERCOCET/ROXICET) 5-325 MG tablet  Every 4 hours PRN        04/26/20 1910           Chrissie Dacquisto, Sharlene Dory 04/26/20 1936    Lucrezia Starch, MD 04/28/20 [phone]

## 2020-04-26 NOTE — ED Notes (Signed)
[facility] called and ordered for transport at 7:35. Twelve pickups ahead of [name].

## 2020-04-26 NOTE — Assessment & Plan Note (Signed)
HTN, takes Amlodipine.   

## 2020-04-26 NOTE — Assessment & Plan Note (Signed)
Restless leg symptom, takes MiraPex 0.'5mg'$  hs

## 2020-04-26 NOTE — Progress Notes (Signed)
Orthopedic Tech Progress Note Patient Details:  [name] 12-28-1932 [id number]  Ortho Devices Type of Ortho Device: Knee Immobilizer Ortho Device/Splint Location: LLE Ortho Device/Splint Interventions: Ordered,Application   Post Interventions Patient Tolerated: Well Instructions Provided: Care of device   Janit Pagan 04/26/2020, 7:07 PM

## 2020-04-26 NOTE — Assessment & Plan Note (Signed)
COPD, takes Spiriva qd, prn Albuterol HFA  

## 2020-04-26 NOTE — Assessment & Plan Note (Signed)
Ambulates with walker is her baseline, mechanical fall in nature.

## 2020-04-26 NOTE — Assessment & Plan Note (Signed)
Hx of dementia, resides in [facility], ambulates with walker, on Memantine 10mg bid.   

## 2020-04-26 NOTE — ED Triage Notes (Signed)
Pt here after a fall on the ice , pt is c/o left knee pain swelling and unable to bear weight on that leg

## 2020-04-26 NOTE — Assessment & Plan Note (Signed)
the left knee pain sustained from fall on her knee about an hour ago when she tripped over in the bathroom. The patient was  assisted by 2 people to the w/c then  to her recliner. The patient stated her left knee pain with any movement, unable to bear weight at all, but able to move her left hip from side to side in recliner with leg rest up. The patient denied dizziness, headache, change of vision, chest pain/pressure, palpitation, or focal weakness, ED to eval to r/o acute fractures.

## 2020-04-26 NOTE — Assessment & Plan Note (Signed)
OA in general, on Tylenol 650mg qd.  

## 2020-04-26 NOTE — Assessment & Plan Note (Signed)
Edema, trace, on Furosemide 20mg qd, Bun/creat 34/0.89 01/11/20  

## 2020-04-27 ENCOUNTER — Encounter: Payer: Self-pay | Admitting: Nurse Practitioner

## 2020-04-27 DIAGNOSIS — F0390 Unspecified dementia without behavioral disturbance: Secondary | ICD-10-CM | POA: Diagnosis not present

## 2020-04-27 DIAGNOSIS — M7021 Olecranon bursitis, right elbow: Secondary | ICD-10-CM | POA: Diagnosis not present

## 2020-04-27 DIAGNOSIS — S82035D Nondisplaced transverse fracture of left patella, subsequent encounter for closed fracture with routine healing: Secondary | ICD-10-CM | POA: Diagnosis not present

## 2020-04-27 DIAGNOSIS — Z882 Allergy status to sulfonamides status: Secondary | ICD-10-CM | POA: Diagnosis not present

## 2020-04-27 DIAGNOSIS — J449 Chronic obstructive pulmonary disease, unspecified: Secondary | ICD-10-CM | POA: Diagnosis not present

## 2020-04-27 DIAGNOSIS — W010XXA Fall on same level from slipping, tripping and stumbling without subsequent striking against object, initial encounter: Secondary | ICD-10-CM | POA: Diagnosis not present

## 2020-04-27 DIAGNOSIS — G40909 Epilepsy, unspecified, not intractable, without status epilepticus: Secondary | ICD-10-CM | POA: Diagnosis not present

## 2020-04-27 DIAGNOSIS — Z888 Allergy status to other drugs, medicaments and biological substances status: Secondary | ICD-10-CM | POA: Diagnosis not present

## 2020-04-27 DIAGNOSIS — R55 Syncope and collapse: Secondary | ICD-10-CM | POA: Diagnosis not present

## 2020-04-27 DIAGNOSIS — S82035A Nondisplaced transverse fracture of left patella, initial encounter for closed fracture: Secondary | ICD-10-CM | POA: Diagnosis not present

## 2020-04-27 DIAGNOSIS — Z791 Long term (current) use of non-steroidal anti-inflammatories (NSAID): Secondary | ICD-10-CM | POA: Diagnosis not present

## 2020-04-27 DIAGNOSIS — S82032A Displaced transverse fracture of left patella, initial encounter for closed fracture: Secondary | ICD-10-CM | POA: Diagnosis not present

## 2020-04-27 DIAGNOSIS — I739 Peripheral vascular disease, unspecified: Secondary | ICD-10-CM | POA: Diagnosis not present

## 2020-04-27 DIAGNOSIS — S83422A Sprain of lateral collateral ligament of left knee, initial encounter: Secondary | ICD-10-CM | POA: Diagnosis not present

## 2020-04-27 DIAGNOSIS — K219 Gastro-esophageal reflux disease without esophagitis: Secondary | ICD-10-CM | POA: Diagnosis not present

## 2020-04-27 DIAGNOSIS — K5901 Slow transit constipation: Secondary | ICD-10-CM | POA: Diagnosis not present

## 2020-04-27 DIAGNOSIS — M159 Polyosteoarthritis, unspecified: Secondary | ICD-10-CM | POA: Diagnosis not present

## 2020-04-27 DIAGNOSIS — S82012A Displaced osteochondral fracture of left patella, initial encounter for closed fracture: Secondary | ICD-10-CM | POA: Diagnosis not present

## 2020-04-27 DIAGNOSIS — G8918 Other acute postprocedural pain: Secondary | ICD-10-CM | POA: Diagnosis not present

## 2020-04-27 DIAGNOSIS — S82035K Nondisplaced transverse fracture of left patella, subsequent encounter for closed fracture with nonunion: Secondary | ICD-10-CM | POA: Diagnosis not present

## 2020-04-27 DIAGNOSIS — Z96612 Presence of left artificial shoulder joint: Secondary | ICD-10-CM | POA: Diagnosis not present

## 2020-04-27 DIAGNOSIS — Z881 Allergy status to other antibiotic agents status: Secondary | ICD-10-CM | POA: Diagnosis not present

## 2020-04-27 DIAGNOSIS — I10 Essential (primary) hypertension: Secondary | ICD-10-CM | POA: Diagnosis not present

## 2020-04-27 DIAGNOSIS — Z01812 Encounter for preprocedural laboratory examination: Secondary | ICD-10-CM | POA: Diagnosis present

## 2020-04-27 DIAGNOSIS — S83412A Sprain of medial collateral ligament of left knee, initial encounter: Secondary | ICD-10-CM | POA: Diagnosis not present

## 2020-04-27 DIAGNOSIS — Z20822 Contact with and (suspected) exposure to covid-19: Secondary | ICD-10-CM | POA: Diagnosis not present

## 2020-04-27 DIAGNOSIS — J438 Other emphysema: Secondary | ICD-10-CM | POA: Diagnosis not present

## 2020-04-27 DIAGNOSIS — Z886 Allergy status to analgesic agent status: Secondary | ICD-10-CM | POA: Diagnosis not present

## 2020-04-27 DIAGNOSIS — Z79899 Other long term (current) drug therapy: Secondary | ICD-10-CM | POA: Diagnosis not present

## 2020-04-27 DIAGNOSIS — M25562 Pain in left knee: Secondary | ICD-10-CM | POA: Diagnosis not present

## 2020-04-27 DIAGNOSIS — G2581 Restless legs syndrome: Secondary | ICD-10-CM | POA: Diagnosis not present

## 2020-04-27 DIAGNOSIS — Z91040 Latex allergy status: Secondary | ICD-10-CM | POA: Diagnosis not present

## 2020-04-27 DIAGNOSIS — Z7982 Long term (current) use of aspirin: Secondary | ICD-10-CM | POA: Diagnosis not present

## 2020-04-27 DIAGNOSIS — Z85828 Personal history of other malignant neoplasm of skin: Secondary | ICD-10-CM | POA: Diagnosis not present

## 2020-04-27 DIAGNOSIS — R609 Edema, unspecified: Secondary | ICD-10-CM | POA: Diagnosis not present

## 2020-04-27 DIAGNOSIS — S82002A Unspecified fracture of left patella, initial encounter for closed fracture: Secondary | ICD-10-CM | POA: Diagnosis not present

## 2020-04-27 DIAGNOSIS — S82002D Unspecified fracture of left patella, subsequent encounter for closed fracture with routine healing: Secondary | ICD-10-CM | POA: Diagnosis not present

## 2020-04-27 DIAGNOSIS — Z87891 Personal history of nicotine dependence: Secondary | ICD-10-CM | POA: Diagnosis not present

## 2020-04-27 MED ORDER — OXYCODONE-ACETAMINOPHEN 5-325 MG PO TABS
1.0000 | ORAL_TABLET | Freq: Once | ORAL | Status: AC
  Administered 2020-04-27: 1 via ORAL
  Filled 2020-04-27: qty 1

## 2020-04-28 ENCOUNTER — Other Ambulatory Visit (HOSPITAL_COMMUNITY)
Admission: RE | Admit: 2020-04-28 | Discharge: 2020-04-28 | Disposition: A | Discharge status: Home / Self Care | Payer: No Typology Code available for payment source | Source: Ambulatory Visit | Attending: Orthopedic Surgery | Admitting: Orthopedic Surgery

## 2020-04-28 ENCOUNTER — Non-Acute Institutional Stay (SKILLED_NURSING_FACILITY): Payer: Medicare PPO | Admitting: Nurse Practitioner

## 2020-04-28 ENCOUNTER — Encounter: Payer: Self-pay | Admitting: Nurse Practitioner

## 2020-04-28 DIAGNOSIS — K219 Gastro-esophageal reflux disease without esophagitis: Secondary | ICD-10-CM

## 2020-04-28 DIAGNOSIS — I10 Essential (primary) hypertension: Secondary | ICD-10-CM | POA: Diagnosis not present

## 2020-04-28 DIAGNOSIS — K5901 Slow transit constipation: Secondary | ICD-10-CM

## 2020-04-28 DIAGNOSIS — G2581 Restless legs syndrome: Secondary | ICD-10-CM | POA: Diagnosis not present

## 2020-04-28 DIAGNOSIS — M7021 Olecranon bursitis, right elbow: Secondary | ICD-10-CM | POA: Diagnosis not present

## 2020-04-28 DIAGNOSIS — Z20822 Contact with and (suspected) exposure to covid-19: Secondary | ICD-10-CM | POA: Insufficient documentation

## 2020-04-28 DIAGNOSIS — Z01812 Encounter for preprocedural laboratory examination: Secondary | ICD-10-CM | POA: Diagnosis present

## 2020-04-28 DIAGNOSIS — R609 Edema, unspecified: Secondary | ICD-10-CM | POA: Diagnosis not present

## 2020-04-28 DIAGNOSIS — J438 Other emphysema: Secondary | ICD-10-CM

## 2020-04-28 DIAGNOSIS — G40909 Epilepsy, unspecified, not intractable, without status epilepticus: Secondary | ICD-10-CM

## 2020-04-28 DIAGNOSIS — M25562 Pain in left knee: Secondary | ICD-10-CM | POA: Diagnosis not present

## 2020-04-28 DIAGNOSIS — S82035K Nondisplaced transverse fracture of left patella, subsequent encounter for closed fracture with nonunion: Secondary | ICD-10-CM | POA: Insufficient documentation

## 2020-04-28 DIAGNOSIS — F0390 Unspecified dementia without behavioral disturbance: Secondary | ICD-10-CM

## 2020-04-28 DIAGNOSIS — M159 Polyosteoarthritis, unspecified: Secondary | ICD-10-CM | POA: Diagnosis not present

## 2020-04-28 LAB — SARS CORONAVIRUS 2 (TAT 6-24 HRS): SARS Coronavirus 2: NEGATIVE

## 2020-04-28 NOTE — Assessment & Plan Note (Signed)
COPD, takes Spiriva qd, prn Albuterol HFA  

## 2020-04-28 NOTE — Assessment & Plan Note (Signed)
Edema, trace, on Furosemide 20mg qd, Bun/creat 35/0.9 03/15/20

## 2020-04-28 NOTE — Assessment & Plan Note (Signed)
Pain is managed with prn Oxycodone. Ortho 04/28/20, WBAT with brace. Elevate legs. Scheduled surgery next Tuesday.

## 2020-04-28 NOTE — Assessment & Plan Note (Signed)
Hx of seizures, stable, on Keppra 500mg qhs, 250mg qd, Lamictal 150mg bid.  

## 2020-04-28 NOTE — Progress Notes (Signed)
Please enter orders for PAT visit scheduled for 05-01-20.

## 2020-04-28 NOTE — Assessment & Plan Note (Signed)
HTN, takes Amlodipine.

## 2020-04-28 NOTE — Progress Notes (Signed)
COVID Vaccine Completed:  x3 Date COVID Vaccine completed:  04-12-19, 06-05-19, 02-15-20 COVID vaccine manufacturer:    Moderna    PCP - Veleta Miners, MD Cardiologist - Candee Furbish, MD.  Last OV 2017  Chest x-ray - 05-10-19 in Epic EKG - 05-13-19 in Epic Stress Test - 2016 in Epic ECHO - 2016 in Epic Cardiac Cath -  Pacemaker/ICD device last checked:  Sleep Study -  CPAP -   Fasting Blood Sugar -  Checks Blood Sugar _____ times a day  Blood Thinner Instructions: Aspirin Instructions: Last Dose:  Anesthesia review:  COPD, PAD, coronary artery calcifications, emphysema, seizure disorder  Patient denies shortness of breath, fever, cough and chest pain at PAT appointment   Patient verbalized understanding of instructions that were given to them at the PAT appointment. Patient was also instructed that they will need to review over the PAT instructions again at home before surgery.

## 2020-04-28 NOTE — Assessment & Plan Note (Signed)
Olecranon bursitis,01/19/20 Ortho limit use LUE as tolerated. R elbow olecranon bursitis, uses pad prn 

## 2020-04-28 NOTE — Assessment & Plan Note (Signed)
OA in general, on Tylenol 650mg qd.  

## 2020-04-28 NOTE — Assessment & Plan Note (Signed)
Hx of dementia,  on Memantine 10mg  bid.

## 2020-04-28 NOTE — Progress Notes (Signed)
Location:  [place] Room Number: 29-A Place of Service:  SNF (31) Provider:  Shirely Toren Darlina Rumpf, NP  [name], MD  Patient Care Team: [name], MD as PCP - General (Internal Medicine) [name], MD as Consulting Physician (Ophthalmology) [name], MD as Consulting Physician (Cardiology) [name], MD as Consulting Physician (Dermatology) [name], MD as Consulting Physician (Orthopedic Surgery) [name], MD as Consulting Physician (Orthopedic Surgery) [name], MD as Consulting Physician (Orthopedic Surgery) [place], [place] Bari Handshoe X, NP as Nurse Practitioner (Nurse Practitioner) [name], MD as Consulting Physician (Neurology)  Extended Emergency Contact Information Primary Emergency Contact: [name],[name] Address: [address]          [place] [address] [place] Phone: [phone] Relation: Sister Secondary Emergency Contact: [name] [place] Phone: [phone] Relation: Sister  Code Status:  DNR Goals of care: Advanced Directive information Advanced Directives 05/01/2020  Does Patient Have a Medical Advance Directive? Yes  Type of Paramedic of [place];Living will  Does patient want to make changes to medical advance directive? -  Copy of [place] in Chart? -  Pre-existing out of facility DNR order (yellow form or pink MOST form) -     Chief Complaint  Patient presents with  . Acute Visit    Patient is seen for medication review.     HPI:  Pt is a 87 y.o. female seen today for an acute visit for admitted to [facility] for therapy following fx of the left patella from a mechanical fall in the bathroom at [facility] 04/26/20. ED eval revealed closed nondisplaced transverse fracture left patella, f/u Ortho, prn Oxycodone for pain.   GERD ST eval significant reflux for several weeks results of  mucus production and significant limiting intake, no observed oropharyngeal dysphagia. Starts Omeprazole  Restless leg symptom, takes MiraPex 0.5mg  hs Hx of dementia,  on Memantine 10mg  bid.  Hx of seizures, stable, on Keppra 500mg  qhs, 250mg  qd, Lamictal 150mg  bid.  Edema, trace, on Furosemide 20mg qd, Bun/creat 35/0.9 03/15/20 OA in general, on Tylenol 650mg  qd. Olecranon bursitis,01/19/20 Ortho limit use LUE as tolerated. R elbow olecranon bursitis, uses pad prn HTN, takes Amlodipine.  COPD, takes Spiriva qd, prn Albuterol HFA Constipation, takes Senokot, MiraLax.    Past Medical History:  Diagnosis Date  . Acute bronchitis 05/23/2011  . Acute upper respiratory infections of unspecified site 05/23/2011  . Arthritis   . Chronic airway obstruction, not elsewhere classified 05/23/2011  . Disturbance of salivary secretion 01/31/2011  . Dizziness and giddiness 01/31/2011  . Essential tremor 04/25/2014  . External hemorrhoids without mention of complication 72/53/6644  . Gait disorder 04/25/2014  . Insomnia, unspecified 09/12/2011  . Lumbago 01/31/2011  . Major depressive disorder, single episode, unspecified 01/31/2011  . Memory disorder 04/25/2014  . Mitral valve disorders(424.0) 01/31/2011  . Other and unspecified hyperlipidemia 01/31/2011  . Other convulsions 01/31/2011  . Other emphysema ([place]) 01/31/2011  . Pain in joint, site unspecified 01/31/2011  . Restless legs syndrome (RLS) 09/12/2011  . Retinal detachment with retinal defect of right eye 2011   right eye twice  . Senile osteoporosis 01/31/2011  . Spontaneous ecchymoses 01/31/2011  . Stiffness of joints, not elsewhere classified, multiple sites 01/31/2011  . Unspecified essential hypertension 01/31/2011   Past Surgical History:  Procedure Laterality Date  . ABDOMINAL HYSTERECTOMY  06/21/2003   TAH/BSO, omenectomy PSB resect, Stg  IC cystadenofibroma  . CHOLECYSTECTOMY  2005   Dr. Marlou Starks  . ELBOW SURGERY Right 2008   broken   Dr. Apolonio Schneiders  . EYE SURGERY    . RETINAL DETACHMENT SURGERY N/A    two  . REVERSE SHOULDER ARTHROPLASTY Left 05/06/2019   Procedure: REVERSE SHOULDER ARTHROPLASTY;  Surgeon: Justice Britain, MD;  Location: [facility] ORS;  Service: Orthopedics;  Laterality: Left;  166min  . ROTATOR CUFF REPAIR Right 2012   Dr. Theda Sers  . SQUAMOUS CELL CARCINOMA EXCISION Bilateral 2012, 8/14   [place] on legs   Dr. Sarajane Jews  . TONSILLECTOMY  1941  . VIDEO BRONCHOSCOPY WITH ENDOBRONCHIAL NAVIGATION N/A 11/29/2015   Procedure: VIDEO BRONCHOSCOPY WITH ENDOBRONCHIAL NAVIGATION;  Surgeon: Collene Gobble, MD;  Location: [facility] OR;  Service: Thoracic;  Laterality: N/A;    Allergies  Allergen Reactions  . Dyazide [Hydrochlorothiazide W-Triamterene]     Other reaction(s): her blood pressure too much  . Latex Other (See Comments)    Swelling  . Other     Other reaction(s): local red reaction   . Rofecoxib     Other reaction(s): shortness of breath  . Sulfa Antibiotics Nausea And Vomiting  . Tetracycline Hcl     Other reaction(s): Cant take due to a drug interation    Outpatient Encounter Medications as of 04/28/2020  Medication Sig  . amLODipine (NORVASC) 2.5 MG tablet Take 2.5 mg by mouth daily.  . cholecalciferol (VITAMIN D) 1000 units tablet Take 1 tablet (1,000 Units total) by mouth daily.  . Emollient (CERAVE) CREA Apply 1 application topically daily. Mix cream with Triamcinolone  . furosemide (LASIX) 40 MG tablet Take 40 mg by mouth daily.  . hydrocortisone 2.5 % cream Apply 1 application topically as needed (for psoriasis).   [name] ketoconazole (NIZORAL) 2 % cream Apply 1 application topically as needed (for psoriasis).   [name] lamoTRIgine (LAMICTAL) 150 MG tablet TAKE 1 TABLET BY MOUTH TWICE DAILY. (Patient taking differently: Take 150 mg by mouth in the morning and at bedtime.)  . levETIRAcetam (KEPPRA) 250 MG tablet Take 250  mg by mouth daily. In the morning  . levETIRAcetam (KEPPRA) 500 MG tablet Take 500 mg by mouth at bedtime.  . memantine (NAMENDA) 10 MG tablet TAKE 1 TABLET BY MOUTH TWICE DAILY. (Patient taking differently: Take 10 mg by mouth in the morning and at bedtime.)  . naproxen sodium (ALEVE) 220 MG tablet Take 220 mg by mouth daily as needed (pain.).  [name] nystatin (MYCOSTATIN/NYSTOP) powder Apply 1 application topically 2 (two) times daily.  [name] oxyCODONE-acetaminophen (PERCOCET/ROXICET) 5-325 MG tablet Take 1 tablet by mouth every 4 (four) hours as needed for severe pain.  . polyethylene glycol (MIRALAX / GLYCOLAX) 17 g packet Take 17 g by mouth at bedtime.  . Potassium Chloride ER 20 MEQ TBCR Take 40 mEq by mouth daily.  . simvastatin (ZOCOR) 10 MG tablet Take 10 mg by mouth at bedtime. Reported on 06/27/2015  . tiotropium (SPIRIVA HANDIHALER) 18 MCG inhalation capsule INHALE CONTENTS OF ONE CAPSULE ONCE DAILY FOR COPD. (Patient taking differently: Place 18 mcg into inhaler and inhale daily.)  . triamcinolone (KENALOG) 0.1 % Apply 1 application topically daily. Mix with cerave  . [DISCONTINUED] aspirin 81 MG tablet Take 81 mg by mouth daily.  . [DISCONTINUED] pramipexole (MIRAPEX) 0.5 MG tablet Take 0.5 mg by mouth at bedtime.   . [DISCONTINUED] albuterol (VENTOLIN HFA) 108 (90 Base) MCG/ACT inhaler Inhale 2 puffs into the lungs every 4 (four) hours as needed  for wheezing or shortness of breath.  . [DISCONTINUED] senna (SENOKOT) 8.6 MG tablet Take 1 tablet by mouth daily.   No facility-administered encounter medications on file as of 04/28/2020.    Review of Systems  Constitutional: Negative for activity change, appetite change and fever.  HENT: Positive for hearing loss. Negative for congestion and voice change.   Eyes: Negative for visual disturbance.  Respiratory: Positive for shortness of breath. Negative for cough and wheezing.        DOE is chronic  Cardiovascular: Positive for leg swelling.  Negative for chest pain and palpitations.  Gastrointestinal: Negative for abdominal pain and constipation.       Acid reflux symptoms.   Genitourinary: Negative for difficulty urinating, dysuria and urgency.  Musculoskeletal: Positive for arthralgias, gait problem and joint swelling.       Acute left knee swelling, pain, bruised.   Skin: Negative for color change.       BLE discoloration  Neurological: Positive for seizures. Negative for dizziness, tremors, speech difficulty and headaches.       Memory lapses. Hx of seizures. RLS  Psychiatric/Behavioral: Negative for confusion and sleep disturbance. The patient is not nervous/anxious.     Immunization History  Administered Date(s) Administered  . Influenza Split 01/06/2014, 01/15/2017, 01/08/2018, 12/09/2018  . Influenza Whole 01/07/2012, 01/06/2013  . Influenza, High Dose Seasonal PF 12/25/2015, 01/20/2017  . Influenza,inj,Quad PF,6+ Mos 12/21/2014  . Influenza-Unspecified 12/09/2018, 01/18/2020  . Moderna Sars-Covid-2 Vaccination 04/12/2019, 06/05/2019, 02/15/2020  . Pneumococcal Conjugate-13 02/01/2014  . Pneumococcal Polysaccharide-23 12/20/1992, 01/15/2000, 04/08/2004, 06/08/2004  . Td 04/08/2002, 04/22/2002  . Tdap 04/09/2011, 08/17/2015  . Zoster 04/08/2008, 05/29/2014, 05/28/2017  . Zoster Recombinat (Shingrix) 08/27/2017   Pertinent  Health Maintenance Due  Topic Date Due  . INFLUENZA VACCINE  Completed  . DEXA SCAN  Completed  . PNA vac Low Risk Adult  Completed  . MAMMOGRAM  Discontinued   Fall Risk  03/24/2014 10/15/2012  Falls in the past year? No No   Functional Status Survey:    Vitals:   04/28/20 1117  BP: 138/78  Pulse: 73  Resp: 18  Temp: (!) 96.6 F (35.9 C)  TempSrc: Oral  SpO2: 98%  Weight: 151 lb 11.2 oz (68.8 kg)  Height: 5\' 2"  (1.575 m)   Body mass index is 27.75 kg/m. Physical Exam Vitals and nursing note reviewed.  Constitutional:      Appearance: Normal appearance.  HENT:     Head:  Normocephalic and atraumatic.     Mouth/Throat:     Mouth: Mucous membranes are moist.  Eyes:     General:        Right eye: Hordeolum present.     Extraocular Movements: Extraocular movements intact.     Conjunctiva/sclera:     Right eye: Right conjunctiva is not injected.     Left eye: Left conjunctiva is not injected.     Pupils: Pupils are equal, round, and reactive to light.     Comments: Mid right upper eyelid style is healing   Cardiovascular:     Rate and Rhythm: Normal rate and regular rhythm.     Heart sounds: Murmur heard.      Comments: PD pulses are not felt.  Pulmonary:     Breath sounds: Rales present.     Comments: Decreased air entry to both lungs. Bibasilar rales.  Abdominal:     General: Bowel sounds are normal.     Palpations: Abdomen is soft.  Tenderness: There is no abdominal tenderness.     Comments: Mid abd surgical scar  Musculoskeletal:        General: Swelling and tenderness present.     Cervical back: Normal range of motion and neck supple.     Right lower leg: Edema present.     Left lower leg: Edema present.     Comments:  Decreased overhead ROM of the left shoulder. Right elbow olecranon bursa effusion about a ping pong ball size. BLE edema trace to 1+ is chronic. Left knee swelling and pain with any movement or weight bearing are new.   Skin:    General: Skin is warm and dry.     Comments: Brownish skin discoloration BLE  Neurological:     General: No focal deficit present.     Mental Status: She is alert and oriented to person, place, and time. Mental status is at baseline.     Motor: No weakness.     Coordination: Coordination normal.     Gait: Gait abnormal.  Psychiatric:        Mood and Affect: Mood normal.        Behavior: Behavior normal.        Thought Content: Thought content normal.        Judgment: Judgment normal.     Labs reviewed: Recent Labs    05/08/19 0327 05/09/19 0338 05/10/19 0341 05/12/19 0334 05/13/19 0403  05/20/19 0000 10/27/19 0000 01/11/20 0000 03/15/20 0000  NA 138 138 139 139 138   < > 141 143 143  K 3.6 3.3* 3.5 3.3* 3.6   < > 4.2 3.9 4.4  CL 103 100 104 104 105   < > 105 106 106  CO2 26 30 28 25 24    < > 25* 27* 25*  GLUCOSE 106* 104* 99 97 101*  --   --   --   --   BUN 17 15 13 19 16    < > 27* 34* 35*  CREATININE 0.48 0.52 0.55 0.62 0.60   < > 1.0 0.9 0.9  CALCIUM 8.3* 8.4* 8.7* 8.7* 8.4*   < > 9.6 9.9 9.6  MG 1.8 1.8 2.0  --   --   --   --   --   --   PHOS 2.1* 3.3 3.0  --   --   --   --   --   --    < > = values in this interval not displayed.   Recent Labs    05/08/19 0327 05/09/19 0338 05/10/19 0341 05/20/19 0000 06/29/19 0000 01/11/20 0000  AST 29 26 25 16 14 16   ALT 23 22 21 15 7 11   ALKPHOS 106 97 91 254* 129* 109  BILITOT 1.5* 1.1 1.4*  --   --   --   PROT 5.7* 5.6* 5.5*  --   --   --   ALBUMIN 3.1* 2.9* 2.9* 3.5 4.0 4.5   Recent Labs    05/04/19 0025 05/04/19 0449 05/11/19 0422 05/12/19 0334 05/13/19 0403 05/20/19 0000 06/29/19 0000 01/11/20 0000  WBC 13.3*   < > 7.6 7.4 8.3 5.9 4.9 5.1  NEUTROABS 11.3*  --   --   --   --  4,260  --  2,953.00  HGB 13.6   < > 10.8* 10.8* 11.3* 12.1 13.3 14.5  HCT 41.6   < > 33.4* 33.6* 36.2 36 40 44  MCV 93.7   < > 95.4 95.2 97.1  --   --   --  PLT 198   < > 220 247 275 364 251 249   < > = values in this interval not displayed.   Lab Results  Component Value Date   TSH 1.79 06/29/2019   No results found for: HGBA1C Lab Results  Component Value Date   CHOL 164 10/12/2019   HDL 67 10/12/2019   LDLCALC 82 10/12/2019   TRIG 70 10/12/2019   CHOLHDL 3.6 06/27/2015    Significant Diagnostic Results in last 30 days:  DG Knee Complete 4 Views Left  Result Date: 04/26/2020 CLINICAL DATA:  Left knee pain after a fall.  Initial encounter. EXAM: LEFT KNEE - COMPLETE 4+ VIEW COMPARISON:  None. FINDINGS: The patient has a transverse fracture through the mid pole of the patella with distraction fracture fragments of  approximately 0.5 cm. No other fracture is identified. Marked soft tissue swelling is seen anterior to the patella consistent with hemorrhagic bursitis. IMPRESSION: Mildly distracted transverse fracture mid pole of the left patella. Marked soft tissue swelling anterior to the patella consistent with hemorrhagic prepatellar bursitis. Electronically Signed   By: Inge Rise M.D.   On: 04/26/2020 16:03    Assessment/Plan GERD (gastroesophageal reflux disease) GERD ST eval significant reflux for several weeks results of mucus production and significant limiting intake, no observed oropharyngeal dysphagia. Starts Omeprazole 20mg  qd.   Restless legs syndrome (RLS) Restless leg symptom, takes MiraPex 0.5mg  hs  Senile dementia (HCC) Hx of dementia,  on Memantine 10mg  bid.    Seizure disorder (HCC) Hx of seizures, stable, on Keppra 500mg  qhs, 250mg  qd, Lamictal 150mg  bid.  Edema Edema, trace, on Furosemide 20mg qd, Bun/creat 35/0.9 03/15/20   Generalized osteoarthritis of multiple sites OA in general, on Tylenol 650mg  qd.  Olecranon bursitis of right elbow Olecranon bursitis,01/19/20 Ortho limit use LUE as tolerated. R elbow olecranon bursitis, uses pad prn   Essential hypertension HTN, takes Amlodipine.    COPD (chronic obstructive pulmonary disease) (HCC) COPD, takes Spiriva qd, prn Albuterol HFA   Slow transit constipation Constipation, takes Senokot, MiraLax.   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 Ortho 04/28/20, WBAT with brace. Elevate legs. Scheduled surgery next Tuesday.      Family/ staff Communication: plan of care reviewed with the patient and charge nurse.   Labs/tests ordered:  None  Time spend 35 minutes.

## 2020-04-28 NOTE — Assessment & Plan Note (Signed)
Restless leg symptom, takes MiraPex 0.'5mg'$  hs

## 2020-04-28 NOTE — Assessment & Plan Note (Signed)
Constipation, takes Senokot, MiraLax 

## 2020-04-28 NOTE — Patient Instructions (Addendum)
DUE TO COVID-19 ONLY ONE VISITOR IS ALLOWED TO COME WITH YOU AND STAY IN THE WAITING ROOM ONLY DURING PRE OP AND PROCEDURE.   I       Your procedure is scheduled on:  Tuesday, 05-02-20   Report to [facility] Main  Entrance    Report to admitting at 7:30  AM   Call this number if you have problems the morning of surgery [phone]   Do not eat food :After Midnight.   May have liquids until  6:30 AM  day of surgery  CLEAR LIQUID DIET  Foods Allowed                                                                     Foods Excluded  Water, Black Coffee and tea, regular and decaf            liquids that you cannot  Plain Jell-O in any flavor  (No red)                                   see through such as: Fruit ices (not with fruit pulp)                                      milk, soups, orange juice              Iced Popsicles (No red)                                      All solid food                                   Apple juices Sports drinks like Gatorade (No red) Lightly seasoned clear broth or consume(fat free) Sugar, honey syrup    Complete one Ensure drink the morning of surgery at  the day of surgery.      1. The day of surgery:  ? Drink ONE (1) Pre-Surgery Clear Ensure or G2 by am the morning of surgery. Drink in one sitting. Do not sip.  ? This drink was given to you during your hospital  pre-op appointment visit. ? Nothing else to drink after completing the  Pre-Surgery Clear Ensure.          If you have questions, please contact your surgeon's office.     Oral Hygiene is also important to reduce your risk of infection.                                    Remember - BRUSH YOUR TEETH THE MORNING OF SURGERY WITH YOUR REGULAR TOOTHPASTE   Do NOT smoke after Midnight   Take these medicines the morning of surgery with A SIP OF WATER:  Amlodipine, Lamotrigine, Levetiracetam, Memantine, Simvastatin, Okay to use I nhaler  You may not have any metal on your body including hair pins, jewelry, and body piercings             Do not wear make-up, lotions, powders, perfumes/cologne, or deodorant             Do not wear nail polish.  Do not shave  48 hours prior to surgery.         Do not bring valuables to the hospital. [facility].   Contacts, dentures or bridgework may not be worn into surgery.    Patients discharged the day of surgery will not be allowed to drive home.                Please read over the following fact sheets you were given: IF YOU HAVE QUESTIONS ABOUT YOUR PRE OP INSTRUCTIONS PLEASE CALL [phone]   [facility] - Preparing for Surgery Before surgery, you can play an important role.  Because skin is not sterile, your skin needs to be as free of germs as possible.  You can reduce the number of germs on your skin by washing with CHG (chlorahexidine gluconate) soap before surgery.  CHG is an antiseptic cleaner which kills germs and bonds with the skin to continue killing germs even after washing. Please DO NOT use if you have an allergy to CHG or antibacterial soaps.  If your skin becomes reddened/irritated stop using the CHG and inform your nurse when you arrive at Short Stay. Do not shave (including legs and underarms) for at least 48 hours prior to the first CHG shower.   Please follow these instructions carefully:  1.  Shower with CHG Soap the night before surgery and the  morning of surgery.  2.  If you choose to wash your hair, wash your hair first as usual with your normal  shampoo.  3.  After you shampoo, rinse your hair and body thoroughly to remove the shampoo.                             4.  Use CHG as you would any other liquid soap.  You can apply chg directly to the skin and wash.                   Gently with a scrungie or clean washcloth.  5.  Apply the CHG Soap to your body ONLY FROM THE NECK DOWN.   Do not use on face/ open                            Wound or open sores. Avoid contact with eyes, ears mouth and genitals (private parts).                       Wash face,  Genitals (private parts) with your normal soap.             6.  Wash thoroughly, paying special attention to the area where your surgery  will be performed.  7.  Thoroughly rinse your body with warm water from the neck down.  8.  DO NOT shower/wash with your normal soap after using and rinsing off the CHG Soap.             9.  Pat yourself dry with a clean towel.  10.  Wear clean pajamas.            11.  Place clean sheets on your bed the night of your first shower and do not  sleep with pets. Day of Surgery : Do not apply any lotions/deodorants the morning of surgery.  Please wear clean clothes to the hospital/surgery center.  FAILURE TO FOLLOW THESE INSTRUCTIONS MAY RESULT IN THE CANCELLATION OF YOUR SURGERY  PATIENT SIGNATURE_________________________________  NURSE SIGNATURE__________________________________  ________________________________________________________________________

## 2020-04-28 NOTE — Assessment & Plan Note (Signed)
GERD ST eval significant reflux for several weeks results of mucus production and significant limiting intake, no observed oropharyngeal dysphagia. Starts Omeprazole 20mg  qd.

## 2020-05-01 ENCOUNTER — Encounter (HOSPITAL_COMMUNITY)
Admission: RE | Admit: 2020-05-01 | Discharge: 2020-05-01 | Disposition: A | Discharge status: Home / Self Care | Payer: Medicare PPO | Source: Ambulatory Visit | Attending: Internal Medicine | Admitting: Internal Medicine

## 2020-05-01 ENCOUNTER — Non-Acute Institutional Stay (SKILLED_NURSING_FACILITY): Payer: Medicare PPO | Admitting: Internal Medicine

## 2020-05-01 ENCOUNTER — Encounter: Payer: Self-pay | Admitting: Internal Medicine

## 2020-05-01 ENCOUNTER — Encounter (HOSPITAL_COMMUNITY): Payer: Self-pay

## 2020-05-01 ENCOUNTER — Encounter: Payer: Self-pay | Admitting: Nurse Practitioner

## 2020-05-01 ENCOUNTER — Other Ambulatory Visit: Payer: Self-pay

## 2020-05-01 DIAGNOSIS — I10 Essential (primary) hypertension: Secondary | ICD-10-CM

## 2020-05-01 DIAGNOSIS — G40909 Epilepsy, unspecified, not intractable, without status epilepticus: Secondary | ICD-10-CM | POA: Diagnosis not present

## 2020-05-01 DIAGNOSIS — G2581 Restless legs syndrome: Secondary | ICD-10-CM | POA: Diagnosis not present

## 2020-05-01 DIAGNOSIS — K219 Gastro-esophageal reflux disease without esophagitis: Secondary | ICD-10-CM

## 2020-05-01 DIAGNOSIS — R609 Edema, unspecified: Secondary | ICD-10-CM

## 2020-05-01 DIAGNOSIS — F0390 Unspecified dementia without behavioral disturbance: Secondary | ICD-10-CM

## 2020-05-01 NOTE — H&P (Signed)
HPI:  5yof with c/o left knee pain after fall. Pt. Walks with a walker at baseline. She tripped when her shoe caught on the walker and presented to the ED where w/u showed a fracture of the left patella.     Past Medical History:  Diagnosis Date  . Acute bronchitis 05/23/2011  . Acute upper respiratory infections of unspecified site 05/23/2011  . Arthritis   . Chronic airway obstruction, not elsewhere classified 05/23/2011  . Disturbance of salivary secretion 01/31/2011  . Dizziness and giddiness 01/31/2011  . Essential tremor 04/25/2014  . External hemorrhoids without mention of complication 95/28/4132  . Gait disorder 04/25/2014  . Insomnia, unspecified 09/12/2011  . Lumbago 01/31/2011  . Major depressive disorder, single episode, unspecified 01/31/2011  . Memory disorder 04/25/2014  . Mitral valve disorders(424.0) 01/31/2011  . Other and unspecified hyperlipidemia 01/31/2011  . Other convulsions 01/31/2011  . Other emphysema ([place]) 01/31/2011  . Pain in joint, site unspecified 01/31/2011  . Restless legs syndrome (RLS) 09/12/2011  . Retinal detachment with retinal defect of right eye 2011   right eye twice  . Senile osteoporosis 01/31/2011  . Spontaneous ecchymoses 01/31/2011  . Stiffness of joints, not elsewhere classified, multiple sites 01/31/2011  . Unspecified essential hypertension 01/31/2011     Past Surgical History:  Procedure Laterality Date  . ABDOMINAL HYSTERECTOMY  06/21/2003   TAH/BSO, omenectomy PSB resect, Stg IC cystadenofibroma  . CHOLECYSTECTOMY  2005   Dr. Marlou Starks  . ELBOW SURGERY Right 2008   broken   Dr. Apolonio Schneiders  . EYE SURGERY    . RETINAL DETACHMENT SURGERY N/A    two  . REVERSE SHOULDER ARTHROPLASTY Left 05/06/2019   Procedure: REVERSE SHOULDER ARTHROPLASTY;  Surgeon: Justice Britain, MD;  Location: [facility];  Service: Orthopedics;  Laterality: Left;  145min  . ROTATOR CUFF REPAIR Right 2012   Dr. Theda Sers  . SQUAMOUS CELL CARCINOMA EXCISION Bilateral 2012, 8/14    [place] on legs   Dr. Sarajane Jews  . TONSILLECTOMY  1941  . VIDEO BRONCHOSCOPY WITH ENDOBRONCHIAL NAVIGATION N/A 11/29/2015   Procedure: VIDEO BRONCHOSCOPY WITH ENDOBRONCHIAL NAVIGATION;  Surgeon: Collene Gobble, MD;  Location: [facility];  Service: Thoracic;  Laterality: N/A;     Family History  Problem Relation Age of Onset  . Heart disease Father        CHF  . Cancer Mother        breast  . Seizures Sister      Current Medications No current facility-administered medications for this encounter.   [name] amLODipine (NORVASC) 2.5 MG tablet  . aspirin 81 MG chewable tablet  . cholecalciferol (VITAMIN D) 1000 units tablet  . Emollient (CERAVE) CREA  . furosemide (LASIX) 40 MG tablet  . hydrocortisone 2.5 % cream  . ketoconazole (NIZORAL) 2 % cream  . lamoTRIgine (LAMICTAL) 150 MG tablet  . levETIRAcetam (KEPPRA) 250 MG tablet  . levETIRAcetam (KEPPRA) 500 MG tablet  . memantine (NAMENDA) 10 MG tablet  . naproxen sodium (ALEVE) 220 MG tablet  . nystatin (MYCOSTATIN/NYSTOP) powder  . oxyCODONE-acetaminophen (PERCOCET/ROXICET) 5-325 MG tablet  . polyethylene glycol (MIRALAX / GLYCOLAX) 17 g packet  . Potassium Chloride ER 20 MEQ TBCR  . pramipexole (MIRAPEX) 0.25 MG tablet  . simvastatin (ZOCOR) 10 MG tablet  . tiotropium (SPIRIVA HANDIHALER) 18 MCG inhalation capsule  . triamcinolone (KENALOG) 0.1 %     Allergies Dyazide [hydrochlorothiazide w-triamterene], Latex, Other, Rofecoxib, Sulfa antibiotics, and Tetracycline hcl  Social History  reports that she quit smoking  about 32 years ago. Her smoking use included cigarettes. She has a 40.00 pack-year smoking history. She has never used smokeless tobacco. She reports that she does not drink alcohol and does not use drugs.    Physical Exam General: Well appearing, in NAD Mental status: Alert and Oriented x3 Lungs: CTA b/l anterior and posterior without crackles or wheeze Heart: RRR, no m/g/r appreciated Abdomen: +BS, soft, nt, nd, no  masses, hernias, or organomegaly appreciated Neurological: Speech Clear and organized, no gross focal findings or movement disorder appreciated Musculoskeletal: no gross joint deformity or swelling.  Observed gait normal Extremities: Warm and well perfused w/o edema Skin: Warm and dry      XR of the left knee  IMPRESSION: Mildly distracted transverse fracture mid pole of the left patella.  Marked soft tissue swelling anterior to the patella consistent with hemorrhagic prepatellar bursitis.   A/P  Fracture of the left patella  Will plan for open reduction internal fixation of the left patella with medial and lateral ligament reinforcement.  Risks, benefits, alternatives were discussed with the pt. Who wishes to proceed with the plan.

## 2020-05-01 NOTE — Anesthesia Preprocedure Evaluation (Addendum)
Anesthesia Evaluation  Patient identified by MRN, date of birth, ID band Patient awake    Reviewed: Allergy & Precautions, NPO status , Patient's Chart, lab work & pertinent test results  Airway Mallampati: I  TM Distance: >3 FB Neck ROM: Full    Dental  (+) Partial Upper, Edentulous Lower   Pulmonary COPD, former smoker,    Pulmonary exam normal breath sounds clear to auscultation       Cardiovascular hypertension, Pt. on medications + CAD and + Peripheral Vascular Disease  Normal cardiovascular exam Rhythm:Regular Rate:Normal  TTE 2016 - Left ventricle: The cavity size was normal. There was moderate  concentric hypertrophy. Systolic function was vigorous. The  estimated ejection fraction was in the range of 65% to 70%. Wall  motion was normal; there were no regional wall motion  abnormalities. Doppler parameters are consistent with abnormal  left ventricular relaxation (grade 1 diastolic dysfunction).  Doppler parameters are consistent with elevated ventricular  end-diastolic filling pressure.  - Aortic valve: Trileaflet; mildly thickened, mildly calcified  leaflets. Transvalvular velocity was within the normal range.  There was no stenosis. There was trivial regurgitation.  - Aortic root: The aortic root was normal in size.  - Ascending aorta: The ascending aorta was normal in size.  - Mitral valve: Calcified annulus. Mildly thickened leaflets .  - Left atrium: The atrium was mildly dilated.  - Right ventricle: The cavity size was normal. Wall thickness was  normal. Systolic function was normal.  - Tricuspid valve: There was mild regurgitation.  - Pulmonic valve: There was trivial regurgitation.  - Pulmonary arteries: PA peak pressure: 35 mm Hg (S).  - Inferior vena cava: The vessel was normal in size. The  respirophasic diameter changes were in the normal range (= 50%),  consistent with normal  central venous pressure.  Stress Test 2016   Nuclear stress EF: 83%.   There was no ST segment deviation noted during stress.   No T wave inversion was noted during stress.   Defect 1: There is a small defect of moderate severity present in the mid anteroseptal and apical anterior location. Likely due to breast attenuation, but cannot rule out infarct. No reversible ischemia.   This is a low risk study.   The study is normal.   The left ventricular ejection fraction is hyperdynamic (>65%).   Neuro/Psych Seizures -, Well Controlled,  PSYCHIATRIC DISORDERS Depression Dementia    GI/Hepatic Neg liver ROS, GERD  Controlled,  Endo/Other  negative endocrine ROS  Renal/GU negative Renal ROS  negative genitourinary   Musculoskeletal  (+) Arthritis ,   Abdominal   Peds  Hematology negative hematology ROS (+)   Anesthesia Other Findings   Reproductive/Obstetrics                           Anesthesia Physical Anesthesia Plan  ASA: III  Anesthesia Plan: General and Regional   Post-op Pain Management:  Regional for Post-op pain   Induction: Intravenous  PONV Risk Score and Plan: 3 and Ondansetron, Dexamethasone and Treatment may vary due to age or medical condition  Airway Management Planned: LMA  Additional Equipment:   Intra-op Plan:   Post-operative Plan: Extubation in OR  Informed Consent: I have reviewed the patients History and Physical, chart, labs and discussed the procedure including the risks, benefits and alternatives for the proposed anesthesia with the patient or authorized representative who has indicated his/her understanding and acceptance.  Dental advisory given  Plan Discussed with: CRNA  Anesthesia Plan Comments:         Anesthesia Quick Evaluation

## 2020-05-01 NOTE — Progress Notes (Signed)
Provider:  Veleta Miners MD Location:   [facility] Room Number: 29 Place of Service:  SNF (31)  PCP: [name], MD Patient Care Team: [name], MD as PCP - General (Internal Medicine) [name], MD as Consulting Physician (Ophthalmology) [name], MD as Consulting Physician (Cardiology) [name], MD as Consulting Physician (Dermatology) [name], MD as Consulting Physician (Orthopedic Surgery) [name], MD as Consulting Physician (Orthopedic Surgery) [name], MD as Consulting Physician (Orthopedic Surgery) [place], [place], [place], NP as Nurse Practitioner (Nurse Practitioner) [name], MD as Consulting Physician (Neurology)  Extended Emergency Contact Information Primary Emergency Contact: [name],[name] Address: [address]          [place] [address] [place] Phone: [phone] Relation: Sister Secondary Emergency Contact: [name] [place] Phone: [phone] Relation: Sister  Code Status: DNR Goals of Care: Advanced Directive information Advanced Directives 04/28/2020  Does Patient Have a Medical Advance Directive? Yes  Type of Advance Directive Out of facility DNR (pink MOST or yellow form)  Does patient want to make changes to medical advance directive? No - Patient declined  Copy of [place] in Chart? -  Pre-existing out of facility DNR order (yellow form or pink MOST form) Pink MOST form placed in chart (order not valid for inpatient use)      Chief Complaint  Patient presents with  . Readmit To SNF    Readmission to SNF    HPI: Patient is a 87 y.o. female seen today for admission to Readmission to SNF  Patientalsohas h/o COPD, Hypertension,Seizure Disorder,Mild Cognitive impairment, Hyperlipidemiaand fall in 1/21 leading toLeft Humerus Fracture with Displacement   She lived in [place] and walks  with the walker. Had mechanical fall. Xray of Left Knee showed mildly distracted transverse fracture mid pole of the left patella with hemorrhagic prepatellar bursitis Plan for surgery in AM by Dr Percell Miller Right now in Knee Immobilizer Pain controlled. Walking with Assist. No Dizziness. No SOB or Cough   Past Medical History:  Diagnosis Date  . Acute bronchitis 05/23/2011  . Acute upper respiratory infections of unspecified site 05/23/2011  . Arthritis   . Chronic airway obstruction, not elsewhere classified 05/23/2011  . Disturbance of salivary secretion 01/31/2011  . Dizziness and giddiness 01/31/2011  . Essential tremor 04/25/2014  . External hemorrhoids without mention of complication [id number]  . Gait disorder 04/25/2014  . Insomnia, unspecified 09/12/2011  . Lumbago 01/31/2011  . Major depressive disorder, single episode, unspecified 01/31/2011  . Memory disorder 04/25/2014  . Mitral valve disorders(424.0) 01/31/2011  . Other and unspecified hyperlipidemia 01/31/2011  . Other convulsions 01/31/2011  . Other emphysema ([place]) 01/31/2011  . Pain in joint, site unspecified 01/31/2011  . Restless legs syndrome (RLS) 09/12/2011  . Retinal detachment with retinal defect of right eye 2011   right eye twice  . Senile osteoporosis 01/31/2011  . Spontaneous ecchymoses 01/31/2011  . Stiffness of joints, not elsewhere classified, multiple sites 01/31/2011  . Unspecified essential hypertension 01/31/2011   Past Surgical History:  Procedure Laterality Date  . ABDOMINAL HYSTERECTOMY  06/21/2003   TAH/BSO, omenectomy PSB resect, Stg IC cystadenofibroma  . CHOLECYSTECTOMY  2005   Dr. Marlou Starks  . ELBOW SURGERY Right 2008   broken   Dr. Apolonio Schneiders  . EYE SURGERY    . RETINAL DETACHMENT SURGERY N/A    two  . REVERSE SHOULDER ARTHROPLASTY Left 05/06/2019  Procedure: REVERSE SHOULDER ARTHROPLASTY;  Surgeon: Justice Britain, MD;  Location: [facility];  Service: Orthopedics;  Laterality: Left;  117min  . ROTATOR  CUFF REPAIR Right 2012   Dr. Theda Sers  . SQUAMOUS CELL CARCINOMA EXCISION Bilateral 2012, 8/14   [place] on legs   Dr. Sarajane Jews  . TONSILLECTOMY  1941  . VIDEO BRONCHOSCOPY WITH ENDOBRONCHIAL NAVIGATION N/A 11/29/2015   Procedure: VIDEO BRONCHOSCOPY WITH ENDOBRONCHIAL NAVIGATION;  Surgeon: Collene Gobble, MD;  Location: [facility];  Service: Thoracic;  Laterality: N/A;    reports that she quit smoking about 32 years ago. Her smoking use included cigarettes. She has a 40.00 pack-year smoking history. She has never used smokeless tobacco. She reports that she does not drink alcohol and does not use drugs. Social History   Socioeconomic History  . Marital status: Divorced    Spouse name: Not on file  . Number of children: 0  . Years of education: Not on file  . Highest education level: Not on file  Occupational History  . Occupation: retired Tourist information centre manager for [employer]  Tobacco Use  . Smoking status: Former Smoker    Packs/day: 1.00    Years: 40.00    Pack years: 40.00    Types: Cigarettes    Quit date: 04/08/1988    Years since quitting: 32.0  . Smokeless tobacco: Never Used  . Tobacco comment: quit in 1990  Vaping Use  . Vaping Use: Never used  Substance and Sexual Activity  . Alcohol use: No    Alcohol/week: 0.0 standard drinks  . Drug use: No  . Sexual activity: Never  Other Topics Concern  . Not on file  Social History Narrative   [facility]ives at [facility]   No children   Divorced   Exercise climbs steps 4 flights daily PT for balance   Alcohol none   Stopped smoking 1989   Patient drinks 3 large sodas daily.   Patient is right handed.   Social Determinants of Health   Financial Resource Strain: Not on file  Food Insecurity: Not on file  Transportation Needs: Not on file  Physical Activity: Not on file  Stress: Not on file  Social Connections: Not on file  Intimate Partner Violence: Not on file    Functional Status Survey:    Family History  Problem  Relation Age of Onset  . Heart disease Father        CHF  . Cancer Mother        breast  . Seizures Sister     Health Maintenance  Topic Date Due  . COVID-19 Vaccine (4 - Booster for Moderna series) 08/14/2020  . TETANUS/TDAP  08/16/2025  . INFLUENZA VACCINE  Completed  . DEXA SCAN  Completed  . PNA vac Low Risk Adult  Completed  . MAMMOGRAM  Discontinued    Allergies  Allergen Reactions  . Dyazide [Hydrochlorothiazide W-Triamterene]     Other reaction(s): her blood pressure too much  . Latex Other (See Comments)    Swelling  . Other     Other reaction(s): local red reaction   . Rofecoxib     Other reaction(s): shortness of breath  . Sulfa Antibiotics Nausea And Vomiting  . Tetracycline Hcl     Other reaction(s): Cant take due to a drug interation    Allergies as of 05/01/2020      Reactions   Dyazide [hydrochlorothiazide W-triamterene]    Other reaction(s): her blood pressure too much   Latex Other (  See Comments)   Swelling   Other    Other reaction(s): local red reaction   Rofecoxib    Other reaction(s): shortness of breath   Sulfa Antibiotics Nausea And Vomiting   Tetracycline Hcl    Other reaction(s): Cant take due to a drug interation      Medication List       Accurate as of May 01, 2020 10:30 AM. If you have any questions, ask your nurse or doctor.        amLODipine 2.5 MG tablet Commonly known as: NORVASC Take 2.5 mg by mouth daily.   aspirin 81 MG chewable tablet Chew 81 mg by mouth daily.   CeraVe Crea Apply 1 application topically daily. Mix cream with Triamcinolone   cholecalciferol 1000 units tablet Commonly known as: VITAMIN D Take 1 tablet (1,000 Units total) by mouth daily.   furosemide 40 MG tablet Commonly known as: LASIX Take 40 mg by mouth daily.   hydrocortisone 2.5 % cream Apply 1 application topically as needed (for psoriasis).   ketoconazole 2 % cream Commonly known as: NIZORAL Apply 1 application topically as  needed (for psoriasis).   lamoTRIgine 150 MG tablet Commonly known as: LAMICTAL TAKE 1 TABLET BY MOUTH TWICE DAILY. What changed: when to take this   levETIRAcetam 500 MG tablet Commonly known as: KEPPRA Take 500 mg by mouth at bedtime.   levETIRAcetam 250 MG tablet Commonly known as: KEPPRA Take 250 mg by mouth daily. In the morning   memantine 10 MG tablet Commonly known as: NAMENDA TAKE 1 TABLET BY MOUTH TWICE DAILY. What changed: when to take this   naproxen sodium 220 MG tablet Commonly known as: ALEVE Take 220 mg by mouth daily as needed (pain.).   nystatin powder Commonly known as: MYCOSTATIN/NYSTOP Apply 1 application topically 2 (two) times daily.   omeprazole 20 MG capsule Commonly known as: PRILOSEC Take 20 mg by mouth daily.   oxyCODONE-acetaminophen 5-325 MG tablet Commonly known as: PERCOCET/ROXICET Take 1 tablet by mouth every 4 (four) hours as needed for severe pain.   polyethylene glycol 17 g packet Commonly known as: MIRALAX / GLYCOLAX Take 17 g by mouth at bedtime.   Potassium Chloride ER 20 MEQ Tbcr Take 40 mEq by mouth daily.   pramipexole 0.25 MG tablet Commonly known as: MIRAPEX Take 0.5 mg by mouth at bedtime.   simvastatin 10 MG tablet Commonly known as: ZOCOR Take 10 mg by mouth at bedtime. Reported on 06/27/2015   tiotropium 18 MCG inhalation capsule Commonly known as: Spiriva HandiHaler INHALE CONTENTS OF ONE CAPSULE ONCE DAILY FOR COPD. What changed:   how much to take  how to take this  when to take this  additional instructions   triamcinolone 0.1 % Commonly known as: KENALOG Apply 1 application topically daily. Mix with cerave       Review of Systems Review of Systems  Constitutional: Negative for activity change, appetite change, chills, diaphoresis, fatigue and fever.  HENT: Negative for mouth sores, postnasal drip, rhinorrhea, sinus pain and sore throat.   Respiratory: Negative for apnea, cough, chest  tightness, shortness of breath and wheezing.   Cardiovascular: Negative for chest pain, palpitations and leg swelling.  Gastrointestinal: Negative for abdominal distention, abdominal pain, constipation, diarrhea, nausea and vomiting.  Genitourinary: Negative for dysuria and frequency.  Musculoskeletal: Negative for arthralgias, joint swelling and myalgias.  Skin: Negative for rash.  Neurological: Negative for dizziness, syncope, weakness, light-headedness and numbness.  Psychiatric/Behavioral: Negative for behavioral problems,  confusion and sleep disturbance.    Vitals:   05/01/20 1014  BP: 114/72  Pulse: 88  Resp: 20  Temp: (!) 96.7 F (35.9 C)  SpO2: 95%  Weight: 151 lb 11.2 oz (68.8 kg)  Height: 5\' 2"  (1.575 m)   Body mass index is 27.75 kg/m. Physical Exam  Constitutional: Oriented to person, place, and time. Well-developed and well-nourished.  HENT:  Head: Normocephalic.  Mouth/Throat: Oropharynx is clear and moist.  Eyes: Pupils are equal, round, and reactive to light.  Neck: Neck supple.  Cardiovascular: Normal rate and normal heart sounds.  No murmur heard. Pulmonary/Chest: Effort normal and breath sounds normal. No respiratory distress. No wheezes. She has no rales.  Abdominal: Soft. Bowel sounds are normal. No distension. There is no tenderness. There is no rebound.  Musculoskeletal:Left Knee in Immobilizer Chronic Venous changes in Legs Lymphadenopathy: none Neurological: Alert and oriented to person, place, and time.  Skin: Skin is warm and dry.  Psychiatric: Normal mood and affect. Behavior is normal. Thought content normal.    Labs reviewed: Basic Metabolic Panel: Recent Labs    05/08/19 0327 05/09/19 0338 05/10/19 0341 05/12/19 0334 05/13/19 0403 05/20/19 0000 10/27/19 0000 01/11/20 0000 03/15/20 0000  NA 138 138 139 139 138   < > 141 143 143  K 3.6 3.3* 3.5 3.3* 3.6   < > 4.2 3.9 4.4  CL 103 100 104 104 105   < > 105 106 106  CO2 26 30 28 25  24    < > 25* 27* 25*  GLUCOSE 106* 104* 99 97 101*  --   --   --   --   BUN 17 15 13 19 16    < > 27* 34* 35*  CREATININE 0.48 0.52 0.55 0.62 0.60   < > 1.0 0.9 0.9  CALCIUM 8.3* 8.4* 8.7* 8.7* 8.4*   < > 9.6 9.9 9.6  MG 1.8 1.8 2.0  --   --   --   --   --   --   PHOS 2.1* 3.3 3.0  --   --   --   --   --   --    < > = values in this interval not displayed.   Liver Function Tests: Recent Labs    05/08/19 0327 05/09/19 0338 05/10/19 0341 05/20/19 0000 06/29/19 0000 01/11/20 0000  AST 29 26 25 16 14 16   ALT 23 22 21 15 7 11   ALKPHOS 106 97 91 254* 129* 109  BILITOT 1.5* 1.1 1.4*  --   --   --   PROT 5.7* 5.6* 5.5*  --   --   --   ALBUMIN 3.1* 2.9* 2.9* 3.5 4.0 4.5   No results for input(s): LIPASE, AMYLASE in the last 8760 hours. No results for input(s): AMMONIA in the last 8760 hours. CBC: Recent Labs    05/04/19 0025 05/04/19 0449 05/11/19 0422 05/12/19 0334 05/13/19 0403 05/20/19 0000 06/29/19 0000 01/11/20 0000  WBC 13.3*   < > 7.6 7.4 8.3 5.9 4.9 5.1  NEUTROABS 11.3*  --   --   --   --  4,260  --  2,953.00  HGB 13.6   < > 10.8* 10.8* 11.3* 12.1 13.3 14.5  HCT 41.6   < > 33.4* 33.6* 36.2 36 40 44  MCV 93.7   < > 95.4 95.2 97.1  --   --   --   PLT 198   < > 220 247 275 364 251  249   < > = values in this interval not displayed.   Cardiac Enzymes: No results for input(s): CKTOTAL, CKMB, CKMBINDEX, TROPONINI in the last 8760 hours. BNP: Invalid input(s): POCBNP No results found for: HGBA1C Lab Results  Component Value Date   TSH 1.79 06/29/2019   Lab Results  Component Value Date   VITAMINB12 425 04/25/2014   No results found for: FOLATE Lab Results  Component Value Date   IRON 29 05/09/2019   TIBC 292 05/09/2019    Imaging and Procedures obtained prior to SNF admission: No results found.  Assessment/Plan S/P Fall with Left Platellar Fracture Plan for surgery tomorrow WBAT Using Hydrocodone PRN for Pain control   Essential hypertension Stable  on Low dose of Norvasc  Gastroesophageal reflux disease,  Doing better with Prilosec  Edema, unspecified type Continue on Lasix 40 mg BUN and creat stable  Seizure disorder (HCC) On Kepprra and Lamictal  Restless legs syndrome (RLS) On Mirapex  Senile dementia without behavioral disturbance (HCC) Highly Functional On NAmenda HLD On Statin  Other emphysema ([place]) On Spiriva  Senile osteoporosis FosamaxWas stopped due to her request  Family/ staff Communication:   Labs/tests ordered:

## 2020-05-01 NOTE — Progress Notes (Signed)
Spoke with [name] at [facility] and verified arrival time, NPO status and meds for patient's surgery on 05-02-20.

## 2020-05-01 NOTE — Progress Notes (Signed)
COVID Vaccine Completed:  x3 Date COVID Vaccine completed:  04-12-19, 06-05-19, 02-15-20 COVID vaccine manufacturer:    Moderna    PCP - Veleta Miners, MD Cardiologist - Candee Furbish, MD.  Last OV 2017  Chest x-ray - 05-10-19 in Epic EKG - 05-13-19 in Epic Stress Test - 2016 in Epic ECHO - 2016 in Epic Cardiac Cath -  Pacemaker/ICD device last checked:  Sleep Study - N/A CPAP -   Fasting Blood Sugar - N/A Checks Blood Sugar _____ times a day  Blood Thinner Instructions: N/A Aspirin Instructions: Last Dose:  Anesthesia review:  COPD, PAD, coronary artery calcifications, emphysema, seizure disorder  Patient denies shortness of breath, fever, cough and chest pain at PAT appointment.  Patient has difficulty climbing stairs due to joint pain.  Can perform housework independently.  Lives in assisted living.  Patient verbalized understanding of instructions that were given to them at the PAT appointment. Patient was also instructed that they will need to review over the PAT instructions again at home before surgery.

## 2020-05-01 NOTE — Progress Notes (Signed)
No show for PAT. Message left.

## 2020-05-02 ENCOUNTER — Ambulatory Visit (HOSPITAL_COMMUNITY)
Admission: RE | Admit: 2020-05-02 | Discharge: 2020-05-02 | Disposition: A | Discharge status: Home / Self Care | Payer: Medicare PPO | Attending: Orthopedic Surgery | Admitting: Orthopedic Surgery

## 2020-05-02 ENCOUNTER — Ambulatory Visit (HOSPITAL_COMMUNITY): Payer: Medicare PPO | Admitting: Physician Assistant

## 2020-05-02 ENCOUNTER — Encounter (HOSPITAL_COMMUNITY): Payer: Self-pay | Admitting: Orthopedic Surgery

## 2020-05-02 ENCOUNTER — Encounter (HOSPITAL_COMMUNITY)
Admission: RE | Disposition: A | Discharge status: Home / Self Care | Payer: Self-pay | Source: Home / Self Care | Attending: Orthopedic Surgery

## 2020-05-02 DIAGNOSIS — Z888 Allergy status to other drugs, medicaments and biological substances status: Secondary | ICD-10-CM | POA: Diagnosis not present

## 2020-05-02 DIAGNOSIS — Z881 Allergy status to other antibiotic agents status: Secondary | ICD-10-CM | POA: Diagnosis not present

## 2020-05-02 DIAGNOSIS — Z791 Long term (current) use of non-steroidal anti-inflammatories (NSAID): Secondary | ICD-10-CM | POA: Diagnosis not present

## 2020-05-02 DIAGNOSIS — Z7982 Long term (current) use of aspirin: Secondary | ICD-10-CM | POA: Insufficient documentation

## 2020-05-02 DIAGNOSIS — G8918 Other acute postprocedural pain: Secondary | ICD-10-CM | POA: Diagnosis not present

## 2020-05-02 DIAGNOSIS — Z886 Allergy status to analgesic agent status: Secondary | ICD-10-CM | POA: Insufficient documentation

## 2020-05-02 DIAGNOSIS — Z882 Allergy status to sulfonamides status: Secondary | ICD-10-CM | POA: Insufficient documentation

## 2020-05-02 DIAGNOSIS — S82032A Displaced transverse fracture of left patella, initial encounter for closed fracture: Secondary | ICD-10-CM | POA: Diagnosis not present

## 2020-05-02 DIAGNOSIS — Z85828 Personal history of other malignant neoplasm of skin: Secondary | ICD-10-CM | POA: Insufficient documentation

## 2020-05-02 DIAGNOSIS — S82002A Unspecified fracture of left patella, initial encounter for closed fracture: Secondary | ICD-10-CM | POA: Diagnosis not present

## 2020-05-02 DIAGNOSIS — Z96612 Presence of left artificial shoulder joint: Secondary | ICD-10-CM | POA: Insufficient documentation

## 2020-05-02 DIAGNOSIS — S83412A Sprain of medial collateral ligament of left knee, initial encounter: Secondary | ICD-10-CM | POA: Diagnosis not present

## 2020-05-02 DIAGNOSIS — Z79899 Other long term (current) drug therapy: Secondary | ICD-10-CM | POA: Insufficient documentation

## 2020-05-02 DIAGNOSIS — W010XXA Fall on same level from slipping, tripping and stumbling without subsequent striking against object, initial encounter: Secondary | ICD-10-CM | POA: Insufficient documentation

## 2020-05-02 DIAGNOSIS — Z87891 Personal history of nicotine dependence: Secondary | ICD-10-CM | POA: Diagnosis not present

## 2020-05-02 DIAGNOSIS — Z91040 Latex allergy status: Secondary | ICD-10-CM | POA: Diagnosis not present

## 2020-05-02 DIAGNOSIS — S83422A Sprain of lateral collateral ligament of left knee, initial encounter: Secondary | ICD-10-CM | POA: Diagnosis not present

## 2020-05-02 DIAGNOSIS — K219 Gastro-esophageal reflux disease without esophagitis: Secondary | ICD-10-CM | POA: Diagnosis not present

## 2020-05-02 DIAGNOSIS — J449 Chronic obstructive pulmonary disease, unspecified: Secondary | ICD-10-CM | POA: Diagnosis not present

## 2020-05-02 HISTORY — PX: ORIF PATELLA: SHX5033

## 2020-05-02 LAB — BASIC METABOLIC PANEL
Anion gap: 11 (ref 5–15)
BUN: 25 mg/dL — ABNORMAL HIGH (ref 8–23)
CO2: 25 mmol/L (ref 22–32)
Calcium: 9.2 mg/dL (ref 8.9–10.3)
Chloride: 104 mmol/L (ref 98–111)
Creatinine, Ser: 0.97 mg/dL (ref 0.44–1.00)
GFR, Estimated: 57 mL/min — ABNORMAL LOW (ref >60)
Glucose, Bld: 99 mg/dL (ref 70–99)
Potassium: 3.5 mmol/L (ref 3.5–5.1)
Sodium: 140 mmol/L (ref 135–145)

## 2020-05-02 LAB — CBC
HCT: 42.6 % (ref 36.0–46.0)
Hemoglobin: 13.6 g/dL (ref 12.0–15.0)
MCH: 28.6 pg (ref 26.0–34.0)
MCHC: 31.9 g/dL (ref 30.0–36.0)
MCV: 89.7 fL (ref 80.0–100.0)
Platelets: 223 10*3/uL (ref 150–400)
RBC: 4.75 MIL/uL (ref 3.87–5.11)
RDW: 13.3 % (ref 11.5–15.5)
WBC: 6.3 10*3/uL (ref 4.0–10.5)
nRBC: 0 % (ref 0.0–0.2)

## 2020-05-02 SURGERY — OPEN REDUCTION INTERNAL FIXATION (ORIF) PATELLA
Anesthesia: Regional | Site: Knee | Laterality: Left

## 2020-05-02 MED ORDER — FENTANYL CITRATE (PF) 100 MCG/2ML IJ SOLN
INTRAMUSCULAR | Status: DC | PRN
  Administered 2020-05-02 (×2): 25 ug via INTRAVENOUS

## 2020-05-02 MED ORDER — BUPIVACAINE HCL (PF) 0.5 % IJ SOLN
INTRAMUSCULAR | Status: AC
  Filled 2020-05-02: qty 30

## 2020-05-02 MED ORDER — OXYCODONE HCL 5 MG/5ML PO SOLN
ORAL | Status: AC
  Filled 2020-05-02: qty 5

## 2020-05-02 MED ORDER — MIDAZOLAM HCL 2 MG/2ML IJ SOLN
INTRAMUSCULAR | Status: DC | PRN
  Administered 2020-05-02: 1 mg via INTRAVENOUS

## 2020-05-02 MED ORDER — CHLORHEXIDINE GLUCONATE 0.12 % MT SOLN
15.0000 mL | Freq: Once | OROMUCOSAL | Status: AC
  Administered 2020-05-02: 15 mL via OROMUCOSAL

## 2020-05-02 MED ORDER — OXYCODONE HCL 5 MG PO TABS
5.0000 mg | ORAL_TABLET | ORAL | Status: DC | PRN
Start: 2020-05-02 — End: 2020-05-02
  Administered 2020-05-02: 5 mg via ORAL

## 2020-05-02 MED ORDER — DEXAMETHASONE SODIUM PHOSPHATE 10 MG/ML IJ SOLN
INTRAMUSCULAR | Status: DC | PRN
  Administered 2020-05-02: 5 mg

## 2020-05-02 MED ORDER — FENTANYL CITRATE (PF) 100 MCG/2ML IJ SOLN
25.0000 ug | INTRAMUSCULAR | Status: DC | PRN
  Administered 2020-05-02: 25 ug via INTRAVENOUS
  Administered 2020-05-02: 50 ug via INTRAVENOUS
  Administered 2020-05-02: 25 ug via INTRAVENOUS

## 2020-05-02 MED ORDER — ASPIRIN 81 MG PO CHEW: 81.0000 mg | CHEWABLE_TABLET | Freq: Two times a day (BID) | ORAL | 0 refills | Status: DC

## 2020-05-02 MED ORDER — ONDANSETRON HCL 4 MG/2ML IJ SOLN
INTRAMUSCULAR | Status: DC | PRN
  Administered 2020-05-02: 4 mg via INTRAVENOUS

## 2020-05-02 MED ORDER — ACETAMINOPHEN 500 MG PO TABS
1000.0000 mg | ORAL_TABLET | Freq: Once | ORAL | Status: AC
  Administered 2020-05-02: 1000 mg via ORAL
  Filled 2020-05-02: qty 2

## 2020-05-02 MED ORDER — PROPOFOL 10 MG/ML IV BOLUS
INTRAVENOUS | Status: AC
  Filled 2020-05-02: qty 20

## 2020-05-02 MED ORDER — OXYCODONE HCL 5 MG PO TABS
5.0000 mg | ORAL_TABLET | Freq: Four times a day (QID) | ORAL | 0 refills | Status: AC | PRN
Start: 2020-05-02 — End: 2020-05-07

## 2020-05-02 MED ORDER — PHENYLEPHRINE HCL (PRESSORS) 10 MG/ML IV SOLN
INTRAVENOUS | Status: AC
  Filled 2020-05-02: qty 1

## 2020-05-02 MED ORDER — ORAL CARE MOUTH RINSE: 15.0000 mL | Freq: Once | OROMUCOSAL | Status: AC

## 2020-05-02 MED ORDER — PHENYLEPHRINE 40 MCG/ML (10ML) SYRINGE FOR IV PUSH (FOR BLOOD PRESSURE SUPPORT)
PREFILLED_SYRINGE | INTRAVENOUS | Status: AC
  Filled 2020-05-02: qty 10

## 2020-05-02 MED ORDER — FENTANYL CITRATE (PF) 100 MCG/2ML IJ SOLN
INTRAMUSCULAR | Status: AC
  Administered 2020-05-02: 25 ug via INTRAVENOUS
  Filled 2020-05-02: qty 2

## 2020-05-02 MED ORDER — OXYCODONE HCL 5 MG PO TABS
ORAL_TABLET | ORAL | Status: AC
  Filled 2020-05-02: qty 1

## 2020-05-02 MED ORDER — ROPIVACAINE HCL 5 MG/ML IJ SOLN
INTRAMUSCULAR | Status: DC | PRN
  Administered 2020-05-02: 20 mL via PERINEURAL

## 2020-05-02 MED ORDER — MIDAZOLAM HCL 2 MG/2ML IJ SOLN
INTRAMUSCULAR | Status: AC
  Filled 2020-05-02: qty 2

## 2020-05-02 MED ORDER — DEXAMETHASONE SODIUM PHOSPHATE 10 MG/ML IJ SOLN
INTRAMUSCULAR | Status: AC
  Filled 2020-05-02: qty 1

## 2020-05-02 MED ORDER — CEFAZOLIN SODIUM-DEXTROSE 2-4 GM/100ML-% IV SOLN: 2.0000 g | Freq: Four times a day (QID) | INTRAVENOUS | Status: DC

## 2020-05-02 MED ORDER — ACETAMINOPHEN 500 MG PO TABS: 1000.0000 mg | ORAL_TABLET | Freq: Four times a day (QID) | ORAL | 2 refills | Status: AC | PRN

## 2020-05-02 MED ORDER — PROPOFOL 10 MG/ML IV BOLUS
INTRAVENOUS | Status: DC | PRN
  Administered 2020-05-02: 80 mg via INTRAVENOUS
  Administered 2020-05-02: 40 mg via INTRAVENOUS

## 2020-05-02 MED ORDER — OXYCODONE HCL 5 MG PO TABS: 5.0000 mg | ORAL_TABLET | Freq: Four times a day (QID) | ORAL | 0 refills | Status: DC | PRN

## 2020-05-02 MED ORDER — LIDOCAINE 2% (20 MG/ML) 5 ML SYRINGE
INTRAMUSCULAR | Status: DC | PRN
  Administered 2020-05-02: 40 mg via INTRAVENOUS

## 2020-05-02 MED ORDER — ONDANSETRON HCL 4 MG/2ML IJ SOLN
INTRAMUSCULAR | Status: AC
  Filled 2020-05-02: qty 2

## 2020-05-02 MED ORDER — PHENYLEPHRINE 40 MCG/ML (10ML) SYRINGE FOR IV PUSH (FOR BLOOD PRESSURE SUPPORT)
PREFILLED_SYRINGE | INTRAVENOUS | Status: DC | PRN
  Administered 2020-05-02 (×5): 120 ug via INTRAVENOUS

## 2020-05-02 MED ORDER — LACTATED RINGERS IV SOLN: INTRAVENOUS | Status: DC

## 2020-05-02 MED ORDER — PHENYLEPHRINE HCL-NACL 10-0.9 MG/250ML-% IV SOLN
INTRAVENOUS | Status: DC | PRN
  Administered 2020-05-02: 40 ug/min via INTRAVENOUS

## 2020-05-02 MED ORDER — FENTANYL CITRATE (PF) 100 MCG/2ML IJ SOLN
INTRAMUSCULAR | Status: AC
  Filled 2020-05-02: qty 2

## 2020-05-02 MED ORDER — DEXAMETHASONE SODIUM PHOSPHATE 10 MG/ML IJ SOLN
INTRAMUSCULAR | Status: DC | PRN
  Administered 2020-05-02: 5 mg via INTRAVENOUS

## 2020-05-02 MED ORDER — LIDOCAINE HCL (PF) 2 % IJ SOLN
INTRAMUSCULAR | Status: AC
  Filled 2020-05-02: qty 5

## 2020-05-02 MED ORDER — CEFAZOLIN SODIUM-DEXTROSE 2-4 GM/100ML-% IV SOLN
2.0000 g | INTRAVENOUS | Status: AC
  Administered 2020-05-02: 2 g via INTRAVENOUS
  Filled 2020-05-02: qty 100

## 2020-05-02 MED ORDER — CELECOXIB 200 MG PO CAPS: 200.0000 mg | ORAL_CAPSULE | Freq: Two times a day (BID) | ORAL | 2 refills | Status: DC

## 2020-05-02 SURGICAL SUPPLY — 62 items
APL PRP STRL LF DISP 70% ISPRP (MISCELLANEOUS) ×1
BANDAGE ESMARK 6X9 LF (GAUZE/BANDAGES/DRESSINGS) ×1 IMPLANT
BIT DRILL 2.6 CANN (BIT) ×2 IMPLANT
BLADE SURG 15 STRL LF DISP TIS (BLADE) ×2 IMPLANT
BLADE SURG 15 STRL SS (BLADE) ×4
BNDG CMPR 9X6 STRL LF SNTH (GAUZE/BANDAGES/DRESSINGS) ×1
BNDG CMPR MED 15X6 ELC VLCR LF (GAUZE/BANDAGES/DRESSINGS) ×1
BNDG COHESIVE 4X5 TAN STRL (GAUZE/BANDAGES/DRESSINGS) ×2 IMPLANT
BNDG ELASTIC 4X5.8 VLCR STR LF (GAUZE/BANDAGES/DRESSINGS) ×2 IMPLANT
BNDG ELASTIC 6X15 VLCR STRL LF (GAUZE/BANDAGES/DRESSINGS) ×2 IMPLANT
BNDG ESMARK 6X9 LF (GAUZE/BANDAGES/DRESSINGS) ×2
CHLORAPREP W/TINT 26 (MISCELLANEOUS) ×2 IMPLANT
COVER WAND RF STERILE (DRAPES) ×2 IMPLANT
CUFF TOURN SGL QUICK 34 (TOURNIQUET CUFF) ×2
CUFF TRNQT CYL 34X4.125X (TOURNIQUET CUFF) ×1 IMPLANT
DRAPE C-ARM 35X43 STRL (DRAPES) ×2 IMPLANT
DRAPE EXTREMITY T 121X128X90 (DISPOSABLE) ×2 IMPLANT
DRAPE U-SHAPE 47X51 STRL (DRAPES) ×2 IMPLANT
DRSG EMULSION OIL 3X3 NADH (GAUZE/BANDAGES/DRESSINGS) ×2 IMPLANT
DRSG MEPILEX BORDER 4X8 (GAUZE/BANDAGES/DRESSINGS) ×2 IMPLANT
ELECT REM PT RETURN 15FT ADLT (MISCELLANEOUS) ×2 IMPLANT
FIBERTAPE 2 W/STRL NDL 17 (SUTURE) ×2 IMPLANT
GAUZE SPONGE 4X4 12PLY STRL (GAUZE/BANDAGES/DRESSINGS) ×2 IMPLANT
GLOVE SRG 8 PF TXTR STRL LF DI (GLOVE) ×2 IMPLANT
GLOVE SURG SYN 7.5  E (GLOVE) ×2
GLOVE SURG SYN 7.5 E (GLOVE) ×1 IMPLANT
GLOVE SURG UNDER POLY LF SZ8 (GLOVE) ×4
GOWN STRL REUS W/ TWL LRG LVL3 (GOWN DISPOSABLE) ×4 IMPLANT
GOWN STRL REUS W/ TWL XL LVL3 (GOWN DISPOSABLE) ×1 IMPLANT
GOWN STRL REUS W/TWL LRG LVL3 (GOWN DISPOSABLE) ×8
GOWN STRL REUS W/TWL XL LVL3 (GOWN DISPOSABLE) ×2
GUIDEWIRE 1.35MM  DUAL TROCAR (WIRE) ×4
GUIDEWIRE 1.35MM DUAL TROCAR (WIRE) ×2 IMPLANT
KIT BASIN OR (CUSTOM PROCEDURE TRAY) ×2 IMPLANT
NEEDLE HYPO 22GX1.5 SAFETY (NEEDLE) IMPLANT
NS IRRIG 1000ML POUR BTL (IV SOLUTION) ×2 IMPLANT
PACK ORTHO EXTREMITY (CUSTOM PROCEDURE TRAY) ×2 IMPLANT
PAD CAST 4YDX4 CTTN HI CHSV (CAST SUPPLIES) ×1 IMPLANT
PADDING CAST ABS 4INX4YD NS (CAST SUPPLIES) ×1
PADDING CAST ABS COTTON 4X4 ST (CAST SUPPLIES) ×1 IMPLANT
PADDING CAST COTTON 4X4 STRL (CAST SUPPLIES) ×2
PENCIL SMOKE EVACUATOR (MISCELLANEOUS) ×2 IMPLANT
SCREW 4X36MM CANN LO-PRO (Screw) ×2 IMPLANT
SCREW CANN 4X34 LO PRO (Screw) ×2 IMPLANT
SCREW LO-PRO BLUNT TIP 4X40MM (Screw) ×2 IMPLANT
SLEEVE SCD COMPRESS KNEE MED (MISCELLANEOUS) ×2 IMPLANT
SPONGE LAP 4X18 RFD (DISPOSABLE) ×2 IMPLANT
STRIP CLOSURE SKIN 1/2X4 (GAUZE/BANDAGES/DRESSINGS) ×2 IMPLANT
SUCTION FRAZIER HANDLE 10FR (MISCELLANEOUS) ×2
SUCTION TUBE FRAZIER 10FR DISP (MISCELLANEOUS) ×1 IMPLANT
SUT ETHILON 3 0 PS 1 (SUTURE) ×4 IMPLANT
SUT MON AB 2-0 CT1 36 (SUTURE) ×2 IMPLANT
SUT VIC AB 0 CT1 27 (SUTURE) ×4
SUT VIC AB 0 CT1 27XBRD ANBCTR (SUTURE) ×2 IMPLANT
SUT VIC AB 0 CT1 36 (SUTURE) ×2 IMPLANT
SYR BULB EAR ULCER 3OZ GRN STR (SYRINGE) ×2 IMPLANT
TOWEL OR NON WOVEN STRL DISP B (DISPOSABLE) ×2 IMPLANT
TUBING CONNECTING 10 (TUBING) ×2 IMPLANT
UNDERPAD 30X36 HEAVY ABSORB (UNDERPADS AND DIAPERS) ×2 IMPLANT
WASHER (Orthopedic Implant) ×4 IMPLANT
WASHER ORTHO 7X (Orthopedic Implant) ×2 IMPLANT
YANKAUER SUCT BULB TIP NO VENT (SUCTIONS) ×2 IMPLANT

## 2020-05-02 NOTE — Interval H&P Note (Signed)
I participated in the care of this patient and agree with the above history, physical and evaluation. I performed a review of the history and a physical exam as detailed   Carling Liberman Daniel Glenis Musolf MD  

## 2020-05-02 NOTE — Anesthesia Procedure Notes (Signed)
Procedure Name: LMA Insertion Date/Time: 05/02/2020 7:45 AM Performed by: Niel Hummer, CRNA Pre-anesthesia Checklist: Patient identified, Emergency Drugs available, Suction available and Patient being monitored Patient Re-evaluated:Patient Re-evaluated prior to induction Oxygen Delivery Method: Circle system utilized Preoxygenation: Pre-oxygenation with 100% oxygen Induction Type: IV induction LMA: LMA with gastric port inserted LMA Size: 4.0 Number of attempts: 1 Dental Injury: Teeth and Oropharynx as per pre-operative assessment  Comments: LMA 3 with gastric port placed. Unable to seat. LMA 4 with gastric port placed. Properly seated.

## 2020-05-02 NOTE — Op Note (Signed)
05/02/2020  8:43 AM  PATIENT:  [name]    PRE-OPERATIVE DIAGNOSIS:  LEFT PATELLA FRACTURE  POST-OPERATIVE DIAGNOSIS:  Same  PROCEDURE:  OPEN REDUCTION INTERNAL (ORIF) FIXATION LEFT PATELLA WITH MEDIAL AND LATERAL LIGAMENT REINFORCEMENTS  SURGEON:  [name], MD  PHYSICIAN ASSISTANT: Margy Clarks, PA-C, he was present and scrubbed throughout the case, critical for completion in a timely fashion, and for retraction, instrumentation, and closure.   ANESTHESIA:   General  PREOPERATIVE INDICATIONS:  [name] is a  87 y.o. female with a diagnosis of LEFT PATELLA FRACTURE who elected for surgical management in order to restore the function of the extensor mechanism.    The risks benefits and alternatives were discussed with the patient preoperatively including but not limited to the risks of infection, bleeding, nerve injury, cardiopulmonary complications, the need for revision surgery, hardware prominence, hardware failure, the need for hardware removal, nonunion, malunion, posttraumatic arthritis, stiffness, loss of strength and function, among others, and the patient was willing to proceed.  OPERATIVE IMPLANTS: 4.0 mm cannulated screws x2 with a total of 2 #2 FiberWire going through the cannulated screws in a figure-of-eight cerclage fashion  OPERATIVE FINDINGS: Displaced patella fracture  OPERATIVE PROCEDURE: The patient was brought to the operating room and placed in the supine position. General anesthesia was administered. IV antibiotics were given. The lower extremity was prepped and draped in usual sterile fashion. The leg was elevated and exsanguinated and the tourniquet was inflated. Time out was performed.   Anterior incision was made over the patella and the fracture fragments identified and cleaned of hematoma. The retinaculum was torn on either side.  I reduced the fracture anatomically and held provisionally with a clamp and placed 2 guidewires for the  cannulated screws.  The lengths were measured, after being confirmed on C-arm, and then I placed the screws, taking care to make sure that there were threads only on the proximal segment, providing compression at the fracture site, and the tips were not prominent proximally.  C-arm used to confirm reduction and position of the screws, and once I was satisfied with this I then used a Keith needle through the screws bringing a total of 2 #2 FiberWire in a figure-of-eight type fashion. This provided excellent secondary fixation. I left the screw lengths slightly short of the far cortex in order to minimize the risk for rupture of the FiberWire over the tip of the screws.  The wounds were irrigated copiously.  I performed a repair of the collateral joint capsul and extensor retinaculum that had ruptured on the medial side. I was happy with this closure.   I repaired the collateral joint capsul and retinaculum on the lateral side. I was happy with this apposition as well  I used Vicryl for the subcutaneous tissue with Steri-Strips and sterile gauze for the skin. The wounds were also injected. A knee immobilizer was applied. The patient was awakened and returned to the PACU in stable and satisfactory condition. There were no complications.   POSTOPERATIVE PLAN: WBAT in knee immobilizer, DVT px: mobilize and chemical dvt px

## 2020-05-02 NOTE — Transfer of Care (Signed)
Immediate Anesthesia Transfer of Care Note  Patient: [name]  Procedure(s) Performed: OPEN REDUCTION INTERNAL (ORIF) FIXATION LEFT PATELLA WITH MEDIAL AND LATERAL LIGAMENT REINFORCEMENTS (Left Knee)  Patient Location: PACU  Anesthesia Type:General  Level of Consciousness: awake  Airway & Oxygen Therapy: Patient Spontanous Breathing and Patient connected to face mask oxygen  Post-op Assessment: Report given to RN and Post -op Vital signs reviewed and stable  Post vital signs: Reviewed and stable  Last Vitals:  Vitals Value Taken Time  BP 136/85 05/02/20 0853  Temp    Pulse 76 05/02/20 0855  Resp 15 05/02/20 0855  SpO2 99 % 05/02/20 0855  Vitals shown include unvalidated device data.  Last Pain:  Vitals:   05/02/20 0647  PainSc: 0-No pain         Complications: No complications documented.

## 2020-05-02 NOTE — Anesthesia Procedure Notes (Signed)
Anesthesia Regional Block: Femoral nerve block   Pre-Anesthetic Checklist: ,, timeout performed, Correct Patient, Correct Site, Correct Laterality, Correct Procedure, Correct Position, site marked, Risks and benefits discussed,  Surgical consent,  Pre-op evaluation,  At surgeon's request and post-op pain management  Laterality: Left  Prep: Maximum Sterile Barrier Precautions used, chloraprep       Needles:  Injection technique: Single-shot  Needle Type: Echogenic Stimulator Needle     Needle Length: 9cm  Needle Gauge: 22     Additional Needles:   Procedures:,,,, ultrasound used (permanent image in chart),,,,  Narrative:  Start time: 05/02/2020 7:15 AM End time: 05/02/2020 7:25 AM Injection made incrementally with aspirations every 5 mL.  Performed by: Personally  Anesthesiologist: Freddrick March, MD  Additional Notes: Monitors applied. No increased pain on injection. No increased resistance to injection. Injection made in 5cc increments. Good needle visualization. Patient tolerated procedure well.

## 2020-05-02 NOTE — Progress Notes (Signed)
Orthopedic Tech Progress Note Patient Details:  [name] Jun 25, 1932 [id number]  Patient ID: [name], female   DOB: 06/17/1932, 87 y.o.   MRN: [id number]   [name] 05/02/2020, 9:24 AM Bledsoe brace ordered from [facility] @0911 

## 2020-05-02 NOTE — Discharge Instructions (Signed)
Elevate leg and apply ice to reduce pain and swelling. If needed, you may increase pain medication for the first few days post op to 2 tablets every 4 hours.  Stop as needed pain medication as soon as you are able.  Maintain knee immobilizer at all times until follow up.  Diet: As you were doing prior to hospitalization   Dressing:  Keep dressings on and dry until follow up. You may remove the ACE bandage.   Activity:  Increase activity slowly as tolerated, but follow the weight bearing instructions below.  The rules on driving is that you can not be taking narcotics while you drive, and you must feel in control of the vehicle.    Weight Bearing:   As tolerated while wearing knee immobilizer.  To prevent constipation: you may use a stool softener such as -  Colace (over the counter) 100 mg by mouth twice a day  Drink plenty of fluids (prune juice may be helpful) and high fiber foods Miralax (over the counter) for constipation as needed.    Itching:  If you experience itching with your medications, try taking only a single pain pill, or even half a pain pill at a time.  You can also use benadryl over the counter for itching or also to help with sleep.   Precautions:  If you experience chest pain or shortness of breath - call 911 immediately for transfer to the hospital emergency department!!  If you develop a fever greater that 101 F, purulent drainage from wound, increased redness or drainage from wound, or calf pain -- Call the office at [phone]                                                 Follow- Up Appointment:  Please call for an appointment to be seen in 2 weeks [place] - [phone]

## 2020-05-03 ENCOUNTER — Encounter (HOSPITAL_COMMUNITY): Payer: Self-pay | Admitting: Orthopedic Surgery

## 2020-05-03 NOTE — Anesthesia Postprocedure Evaluation (Signed)
Anesthesia Post Note  Patient: [name]  Procedure(s) Performed: OPEN REDUCTION INTERNAL (ORIF) FIXATION LEFT PATELLA WITH MEDIAL AND LATERAL LIGAMENT REINFORCEMENTS (Left Knee)     Patient location during evaluation: PACU Anesthesia Type: Regional and General Level of consciousness: awake and alert Pain management: pain level controlled Vital Signs Assessment: post-procedure vital signs reviewed and stable Respiratory status: spontaneous breathing, nonlabored ventilation, respiratory function stable and patient connected to nasal cannula oxygen Cardiovascular status: blood pressure returned to baseline and stable Postop Assessment: no apparent nausea or vomiting Anesthetic complications: no   No complications documented.  Last Vitals:  Vitals:   05/02/20 1215 05/02/20 1230  BP: 140/85 140/85  Pulse: 84 84  Resp: 14 16  Temp:  36.7 C  SpO2:  95%    Last Pain:  Vitals:   05/02/20 1230  PainSc: 0-No pain                 Stina Gane L Destyne Goodreau

## 2020-05-10 DIAGNOSIS — S82002D Unspecified fracture of left patella, subsequent encounter for closed fracture with routine healing: Secondary | ICD-10-CM | POA: Diagnosis not present

## 2020-05-17 ENCOUNTER — Ambulatory Visit: Payer: Medicare PPO | Admitting: Neurology

## 2020-05-24 DIAGNOSIS — S82002D Unspecified fracture of left patella, subsequent encounter for closed fracture with routine healing: Secondary | ICD-10-CM | POA: Diagnosis not present

## 2020-05-25 NOTE — Patient Instructions (Addendum)
DUE TO COVID-19 ONLY ONE VISITOR IS ALLOWED TO COME WITH YOU AND STAY IN THE WAITING ROOM ONLY DURING PRE OP AND PROCEDURE DAY OF SURGERY. THE 1 VISITOR  MAY VISIT WITH YOU AFTER SURGERY IN YOUR PRIVATE ROOM DURING VISITING HOURS ONLY!   . ONCE YOUR COVID TEST IS COMPLETED,  PLEASE BEGIN THE QUARANTINE INSTRUCTIONS AS OUTLINED IN YOUR HANDOUT.                SKILYNN DURNEY   Your procedure is scheduled on: 05/30/20   Report to [facility] Main  Entrance   Report to admitting at   11:05 AM     Call this number if you have problems the morning of surgery [place], NO [place].     Take these medicines the morning of surgery with A SIP OF WATER: Lamotrigine, Levetiracetam, Pramipexole, Amlodipine, Memantine,  Omeprazole   No food after midnight.    You may have clear liquid until 10:00 AM. On the morning of surgery    CLEAR LIQUID DIET   Foods Allowed                                                                     Foods Excluded  Coffee and tea, regular and decaf                             liquids that you cannot  Plain Jell-O any favor except red or purple                                           see through such as: Fruit ices (not with fruit pulp)                                     milk, soups, orange juice  Iced Popsicles                                    All solid food Carbonated beverages, regular and diet                                    Cranberry, grape and apple juices Sports drinks like Gatorade Lightly seasoned clear broth or consume(fat free) Sugar, honey syrup   At 9:30 AM drink pre surgery drink.   Nothing by mouth after 10:00 AM.                                 You may not have any metal on your body including hair pins and              piercings  Do not wear jewelry, make-up, lotions, powders or perfumes, deodorant  Do not wear nail  polish on your fingernails.  Do not shave  48 hours prior to surgery.     Do not bring valuables to the hospital. [place].  Contacts, dentures or bridgework may not be worn into surgery.      Patients discharged the day of surgery will not be allowed to drive home.   IF YOU ARE HAVING SURGERY AND GOING HOME THE SAME DAY, YOU MUST HAVE AN ADULT TO DRIVE YOU HOME AND BE WITH YOU FOR 24 HOURS.  YOU MAY GO HOME BY TAXI OR UBER OR ORTHERWISE, BUT AN ADULT MUST ACCOMPANY YOU HOME AND STAY WITH YOU FOR 24 HOURS.  Name and phone number of your driver:  Special Instructions: N/A              Please read over the following fact sheets you were given: _____________________________________________________________________             [facility] - Preparing for Surgery Before surgery, you can play an important role.  Because skin is not sterile, your skin needs to be as free of germs as possible.  You can reduce the number of germs on your skin by washing with CHG (chlorahexidine gluconate) soap before surgery.  CHG is an antiseptic cleaner which kills germs and bonds with the skin to continue killing germs even after washing. Please DO NOT use if you have an allergy to CHG or antibacterial soaps.  If your skin becomes reddened/irritated stop using the CHG and inform your nurse when you arrive at Short Stay. Do not shave (including legs and underarms) for at least 48 hours prior to the first CHG shower.   Please follow these instructions carefully:  1.  Shower with CHG Soap the night before surgery and the  morning of Surgery.  2.  If you choose to wash your hair, wash your hair first as usual with your  normal  shampoo.  3.  After you shampoo, rinse your hair and body thoroughly to remove the  shampoo.                                        4.  Use CHG as you would any other liquid soap.  You can apply chg directly  to the skin and wash                        Gently with a scrungie or clean washcloth.  5.  Apply the CHG Soap to your body ONLY FROM THE NECK DOWN.   Do not use on face/ open                           Wound or open sores. Avoid contact with eyes, ears mouth and genitals (private parts).                       Wash face,  Genitals (private parts) with your normal soap.             6.  Wash thoroughly, paying special attention to the area where your surgery  will be performed.  7.  Thoroughly rinse your body with warm water from the neck down.  8.  DO NOT shower/wash with your normal soap after using and rinsing off  the CHG Soap.             9.  Pat yourself dry with a clean towel.            10.  Wear clean pajamas.            11.  Place clean sheets on your bed the night of your first shower and do not  sleep with pets.  Day of Surgery : Do not apply any lotions/deodorants the morning of surgery.  Please wear clean clothes to the hospital/surgery center.    FAILURE TO FOLLOW THESE INSTRUCTIONS MAY RESULT IN THE CANCELLATION OF YOUR SURGERY PATIENT SIGNATURE_________________________________  NURSE SIGNATURE__________________________________  ________________________________________________________________________   Adam Phenix  An incentive spirometer is a tool that can help keep your lungs clear and active. This tool measures how well you are filling your lungs with each breath. Taking long deep breaths may help reverse or decrease the chance of developing breathing (pulmonary) problems (especially infection) following:  A long period of time when you are unable to move or be active. BEFORE THE PROCEDURE   If the spirometer includes an indicator to show your best effort, your nurse or respiratory therapist will set it to a desired goal.  If possible, sit up straight or lean slightly forward. Try not to slouch.  Hold the incentive spirometer in an upright position. INSTRUCTIONS FOR USE  1. Sit on the  edge of your bed if possible, or sit up as far as you can in bed or on a chair. 2. Hold the incentive spirometer in an upright position. 3. Breathe out normally. 4. Place the mouthpiece in your mouth and seal your lips tightly around it. 5. Breathe in slowly and as deeply as possible, raising the piston or the ball toward the top of the column. 6. Hold your breath for 3-5 seconds or for as long as possible. Allow the piston or ball to fall to the bottom of the column. 7. Remove the mouthpiece from your mouth and breathe out normally. 8. Rest for a few seconds and repeat Steps 1 through 7 at least 10 times every 1-2 hours when you are awake. Take your time and take a few normal breaths between deep breaths. 9. The spirometer may include an indicator to show your best effort. Use the indicator as a goal to work toward during each repetition. 10. After each set of 10 deep breaths, practice coughing to be sure your lungs are clear. If you have an incision (the cut made at the time of surgery), support your incision when coughing by placing a pillow or rolled up towels firmly against it. Once you are able to get out of bed, walk around indoors and cough well. You may stop using the incentive spirometer when instructed by your caregiver.  RISKS AND COMPLICATIONS  Take your time so you do not get dizzy or light-headed.  If you are in pain, you may need to take or ask for pain medication before doing incentive spirometry. It is harder to take a deep breath if you are having pain. AFTER USE  Rest and breathe slowly and easily.  It can be helpful to keep track of a log of your progress. Your caregiver can provide you with a simple table to help with this. If you are using the spirometer at home, follow these instructions: [place] IF:   You  are having difficultly using the spirometer.  You have trouble using the spirometer as often as instructed.  Your pain medication is not giving enough  relief while using the spirometer.  You develop fever of 100.5 F (38.1 C) or higher. SEEK IMMEDIATE MEDICAL CARE IF:   You cough up bloody sputum that had not been present before.  You develop fever of 102 F (38.9 C) or greater.  You develop worsening pain at or near the incision site. MAKE SURE YOU:   Understand these instructions.  Will watch your condition.  Will get help right away if you are not doing well or get worse. Document Released: 08/05/2006 Document Revised: 06/17/2011 Document Reviewed: 10/06/2006 [facility] Patient Information 2014 [place], [place].   ________________________________________________________________________

## 2020-05-26 ENCOUNTER — Encounter (HOSPITAL_COMMUNITY): Payer: Self-pay

## 2020-05-26 ENCOUNTER — Other Ambulatory Visit: Payer: Self-pay

## 2020-05-26 ENCOUNTER — Encounter (HOSPITAL_COMMUNITY)
Admission: RE | Admit: 2020-05-26 | Discharge: 2020-05-26 | Disposition: A | Discharge status: Home / Self Care | Payer: Medicare PPO | Source: Ambulatory Visit | Attending: Orthopedic Surgery | Admitting: Orthopedic Surgery

## 2020-05-26 ENCOUNTER — Other Ambulatory Visit (HOSPITAL_COMMUNITY)
Admission: RE | Admit: 2020-05-26 | Discharge: 2020-05-26 | Disposition: A | Discharge status: Home / Self Care | Payer: Medicare PPO | Source: Ambulatory Visit | Attending: Orthopedic Surgery | Admitting: Orthopedic Surgery

## 2020-05-26 DIAGNOSIS — Z20822 Contact with and (suspected) exposure to covid-19: Secondary | ICD-10-CM | POA: Insufficient documentation

## 2020-05-26 DIAGNOSIS — I10 Essential (primary) hypertension: Secondary | ICD-10-CM | POA: Insufficient documentation

## 2020-05-26 DIAGNOSIS — Z01818 Encounter for other preprocedural examination: Secondary | ICD-10-CM | POA: Diagnosis not present

## 2020-05-26 HISTORY — DX: Epilepsy, unspecified, not intractable, without status epilepticus: G40.909

## 2020-05-26 LAB — BASIC METABOLIC PANEL
Anion gap: 11 (ref 5–15)
BUN: 27 mg/dL — ABNORMAL HIGH (ref 8–23)
CO2: 26 mmol/L (ref 22–32)
Calcium: 9.5 mg/dL (ref 8.9–10.3)
Chloride: 104 mmol/L (ref 98–111)
Creatinine, Ser: 0.94 mg/dL (ref 0.44–1.00)
GFR, Estimated: 59 mL/min — ABNORMAL LOW (ref >60)
Glucose, Bld: 86 mg/dL (ref 70–99)
Potassium: 3.9 mmol/L (ref 3.5–5.1)
Sodium: 141 mmol/L (ref 135–145)

## 2020-05-26 LAB — CBC
HCT: 46.5 % — ABNORMAL HIGH (ref 36.0–46.0)
Hemoglobin: 14.7 g/dL (ref 12.0–15.0)
MCH: 29 pg (ref 26.0–34.0)
MCHC: 31.6 g/dL (ref 30.0–36.0)
MCV: 91.7 fL (ref 80.0–100.0)
Platelets: 253 10*3/uL (ref 150–400)
RBC: 5.07 MIL/uL (ref 3.87–5.11)
RDW: 14 % (ref 11.5–15.5)
WBC: 5.7 10*3/uL (ref 4.0–10.5)
nRBC: 0 % (ref 0.0–0.2)

## 2020-05-26 LAB — SARS CORONAVIRUS 2 (TAT 6-24 HRS): SARS Coronavirus 2: NEGATIVE

## 2020-05-26 NOTE — Progress Notes (Signed)
COVID Vaccine Completed:yes Date COVID Vaccine completed:06/05/19-booster 02/15/20 COVID vaccine manufacturer:  Moderna     PCP - Dr. Jonetta Speak Cardiologist - Dr. Marlou Porch  Chest x-ray - no EKG - 05/26/20 chart and Epic Stress Test - no ECHO - Pt not sure maybe years ago Cardiac Cath - no Pacemaker/ICD device last checked:NA  Sleep Study - no CPAP -   Fasting Blood Sugar - NA Checks Blood Sugar _____ times a day  Blood Thinner Instructions:asa 81 Aspirin Instructions:none Last Dose:  Anesthesia review:   Patient denies shortness of breath, fever, cough and chest pain at PAT appointment yes  Patient verbalized understanding of instructions that were given to them at the PAT appointment. Patient was also instructed that they will need to review over the PAT instructions again at home before surgery. Yes Pt lives at friends home she uses a walker and doesn't clime stairs or do housework. She uses her inhaler everyday and needs help with ADLs

## 2020-05-26 NOTE — H&P (Signed)
PREOPERATIVE H&P  Chief Complaint: reinjury of right patella fracture  HPI: [name] is a 87 y.o. female who presents with a non-union of an ORIF right patella done on 05/02/20. She had been doing well postop until she felt a pull/pop in her knee one night while in bed. Her pain has been well controlled. She has elected for surgical management due to the loss of reduction of her patella fracture.   Past Medical History:  Diagnosis Date  . Acute bronchitis 05/23/2011  . Acute upper respiratory infections of unspecified site 05/23/2011  . Arthritis   . Chronic airway obstruction, not elsewhere classified 05/23/2011  . Disturbance of salivary secretion 01/31/2011  . Dizziness and giddiness 01/31/2011  . Essential tremor 04/25/2014  . External hemorrhoids without mention of complication 16/01/9603  . Gait disorder 04/25/2014  . Insomnia, unspecified 09/12/2011  . Lumbago 01/31/2011  . Major depressive disorder, single episode, unspecified 01/31/2011  . Memory disorder 04/25/2014  . Mitral valve disorders(424.0) 01/31/2011  . Other and unspecified hyperlipidemia 01/31/2011  . Other convulsions 01/31/2011  . Other emphysema ([place]) 01/31/2011  . Pain in joint, site unspecified 01/31/2011  . Restless legs syndrome (RLS) 09/12/2011  . Retinal detachment with retinal defect of right eye 2011   right eye twice  . Senile osteoporosis 01/31/2011  . Spontaneous ecchymoses 01/31/2011  . Stiffness of joints, not elsewhere classified, multiple sites 01/31/2011  . Unspecified essential hypertension 01/31/2011   Past Surgical History:  Procedure Laterality Date  . ABDOMINAL HYSTERECTOMY  06/21/2003   TAH/BSO, omenectomy PSB resect, Stg IC cystadenofibroma  . CHOLECYSTECTOMY  2005   Dr. Marlou Starks  . ELBOW SURGERY Right 2008   broken   Dr. Apolonio Schneiders  . EYE SURGERY    . ORIF PATELLA Left 05/02/2020   Procedure: OPEN REDUCTION INTERNAL (ORIF) FIXATION LEFT PATELLA WITH MEDIAL AND LATERAL LIGAMENT  REINFORCEMENTS;  Surgeon: [name], MD;  Location: [facility];  Service: Orthopedics;  Laterality: Left;  . RETINAL DETACHMENT SURGERY N/A    two  . REVERSE SHOULDER ARTHROPLASTY Left 05/06/2019   Procedure: REVERSE SHOULDER ARTHROPLASTY;  Surgeon: Justice Britain, MD;  Location: [facility];  Service: Orthopedics;  Laterality: Left;  121min  . ROTATOR CUFF REPAIR Right 2012   Dr. Theda Sers  . SQUAMOUS CELL CARCINOMA EXCISION Bilateral 2012, 8/14   [place] on legs   Dr. Sarajane Jews  . TONSILLECTOMY  1941  . VIDEO BRONCHOSCOPY WITH ENDOBRONCHIAL NAVIGATION N/A 11/29/2015   Procedure: VIDEO BRONCHOSCOPY WITH ENDOBRONCHIAL NAVIGATION;  Surgeon: Collene Gobble, MD;  Location: [facility];  Service: Thoracic;  Laterality: N/A;   Social History   Socioeconomic History  . Marital status: Divorced    Spouse name: Not on file  . Number of children: 0  . Years of education: Not on file  . Highest education level: Not on file  Occupational History  . Occupation: retired Tourist information centre manager for [employer]  Tobacco Use  . Smoking status: Former Smoker    Packs/day: 1.00    Years: 40.00    Pack years: 40.00    Types: Cigarettes    Quit date: 04/08/1988    Years since quitting: 32.1  . Smokeless tobacco: Never Used  . Tobacco comment: quit in 1990  Vaping Use  . Vaping Use: Never used  Substance and Sexual Activity  . Alcohol use: No    Alcohol/week: 0.0 standard drinks  . Drug use: No  . Sexual activity: Never  Other Topics Concern  . Not on file  Social History Narrative   Lives at [place]   No children   Divorced   Exercise climbs steps 4 flights daily PT for balance   Alcohol none   Stopped smoking 1989   Patient drinks 3 large sodas daily.   Patient is right handed.   Social Determinants of Health   Financial Resource Strain: Not on file  Food Insecurity: Not on file  Transportation Needs: Not on file  Physical Activity: Not on file  Stress: Not on file  Social Connections: Not  on file   Family History  Problem Relation Age of Onset  . Heart disease Father        CHF  . Cancer Mother        breast  . Seizures Sister    Allergies  Allergen Reactions  . Dyazide [Hydrochlorothiazide W-Triamterene]     Other reaction(s): her blood pressure too much  . Latex Other (See Comments)    Swelling  . Other     Other reaction(s): local red reaction   . Rofecoxib     Other reaction(s): shortness of breath  . Sulfa Antibiotics Nausea And Vomiting  . Tetracycline Hcl     Other reaction(s): Cant take due to a drug interation   Prior to Admission medications   Medication Sig Start Date End Date Taking? Authorizing Provider  acetaminophen (TYLENOL) 500 MG tablet Take 2 tablets (1,000 mg total) by mouth every 6 (six) hours as needed. Patient taking differently: Take 1,000 mg by mouth every 6 (six) hours as needed for moderate pain. 05/02/20 05/02/21 Yes Rachael Fee, PA-C  amLODipine (NORVASC) 2.5 MG tablet Take 2.5 mg by mouth daily.   Yes [provider]  aspirin 81 MG chewable tablet Chew 1 tablet (81 mg total) by mouth in the morning and at bedtime. Patient taking differently: Chew 81 mg by mouth daily. 05/02/20 06/01/20 Yes Rachael Fee, PA-C  celecoxib (CELEBREX) 200 MG capsule Take 1 capsule (200 mg total) by mouth 2 (two) times daily. 05/02/20 05/02/21 Yes Rachael Fee, PA-C  cholecalciferol (VITAMIN D) 1000 units tablet Take 1 tablet (1,000 Units total) by mouth daily. 11/29/15  Yes Collene Gobble, MD  Emollient (CERAVE) CREA Apply 1 application topically daily. Mix cream with Triamcinolone   Yes [provider]  furosemide (LASIX) 40 MG tablet Take 40 mg by mouth daily. 05/26/19  Yes [provider]  hydrocortisone 2.5 % lotion Apply 1 application topically daily as needed (psoriasis).   Yes [provider]  ketoconazole (NIZORAL) 2 % cream Apply 1 application topically as needed (for psoriasis).  09/20/15  Yes [provider]  lamoTRIgine (LAMICTAL) 150 MG tablet TAKE 1 TABLET BY MOUTH TWICE DAILY. Patient taking differently: Take 150 mg by mouth in the morning and at bedtime. 10/21/18  Yes Suzzanne Cloud, NP  levETIRAcetam (KEPPRA) 250 MG tablet Take 250 mg by mouth daily. In the morning   Yes [provider]  levETIRAcetam (KEPPRA) 500 MG tablet Take 500 mg by mouth at bedtime.   Yes [provider]  memantine (NAMENDA) 10 MG tablet TAKE 1 TABLET BY MOUTH TWICE DAILY. Patient taking differently: Take 10 mg by mouth in the morning and at bedtime. 01/13/19  Yes Suzzanne Cloud, NP  nystatin (MYCOSTATIN/NYSTOP) powder Apply 1 application topically 2 (two) times daily as needed (tinea cruris).   Yes [provider]  omeprazole (PRILOSEC) 20 MG capsule Take 20 mg by mouth daily.  Yes [provider]  oxyCODONE (OXY IR/ROXICODONE) 5 MG immediate release tablet Take 5 mg by mouth every 6 (six) hours as needed for severe pain.   Yes [provider]  polyethylene glycol (MIRALAX / GLYCOLAX) 17 g packet Take 17 g by mouth daily.   Yes [provider]  Potassium Chloride ER 20 MEQ TBCR Take 40 mEq by mouth daily. 05/26/19  Yes [provider]  pramipexole (MIRAPEX) 0.25 MG tablet Take 0.5 mg by mouth at bedtime.   Yes [provider]  sennosides-docusate sodium (SENOKOT-S) 8.6-50 MG tablet Take 2 tablets by mouth at bedtime.   Yes [provider]  simvastatin (ZOCOR) 10 MG tablet Take 10 mg by mouth at bedtime.   Yes [provider]  tiotropium (SPIRIVA HANDIHALER) 18 MCG inhalation capsule INHALE CONTENTS OF ONE CAPSULE ONCE DAILY FOR COPD. Patient taking differently: Place 18 mcg into inhaler and inhale daily. 10/02/17  Yes Brand Males, MD  triamcinolone (KENALOG) 0.1 % Apply 1 application topically daily. Mix with cerave   Yes [provider]     Positive ROS: All other systems have been reviewed and were  otherwise negative with the exception of those mentioned in the HPI and as above.  Physical Exam: General: Alert, no acute distress Cardiovascular: No pedal edema Respiratory: No cyanosis, no use of accessory musculature GI: No organomegaly, abdomen is soft and non-tender Skin: No lesions in the area of chief complaint Neurologic: Sensation intact distally Psychiatric: Patient is competent for consent with normal mood and affect Lymphatic: No axillary or cervical lymphadenopathy  MUSCULOSKELETAL: TTP right knee, palpable step off at the fracture site of the patella, healing surgical incision that is c/d/i  Imaging: Xrays show a loss of reduction of the patella ORIF and separation of her fracture  Assessment: Loss of reduction following ORIF right patella on 05/02/20   Plan: Plan for Procedure(s): OPEN REDUCTION INTERNAL (ORIF) FIXATION PATELLA KNEE ARTHROSCOPY WITH MEDIAL COLLATERAL LIGAMENT RECONSTRUCTION  The risks benefits and alternatives were discussed with the patient including but not limited to the risks of nonoperative treatment, versus surgical intervention including infection, bleeding, nerve injury,  blood clots, cardiopulmonary complications, morbidity, mortality, among others, and they were willing to proceed.   We will place her in a knee immobilizer and keep her NWB for several weeks in an attempt to get this fracture to heal properly.    Britt Bottom, [place] Office [phone] 05/26/2020 2:09 PM

## 2020-05-29 ENCOUNTER — Encounter (HOSPITAL_COMMUNITY): Payer: Self-pay

## 2020-05-29 NOTE — Anesthesia Preprocedure Evaluation (Deleted)
Anesthesia Evaluation    Airway        Dental   Pulmonary former smoker,           Cardiovascular hypertension,      Neuro/Psych    GI/Hepatic   Endo/Other    Renal/GU      Musculoskeletal   Abdominal   Peds  Hematology   Anesthesia Other Findings   Reproductive/Obstetrics                                                              Anesthesia Evaluation  Patient identified by MRN, date of birth, ID band Patient awake    Reviewed: Allergy & Precautions, NPO status , Patient's Chart, lab work & pertinent test results  Airway Mallampati: I  TM Distance: >3 FB Neck ROM: Full    Dental  (+) Partial Upper, Edentulous Lower   Pulmonary COPD, former smoker,    Pulmonary exam normal breath sounds clear to auscultation       Cardiovascular hypertension, Pt. on medications + CAD and + Peripheral Vascular Disease  Normal cardiovascular exam Rhythm:Regular Rate:Normal  TTE 2016 - Left ventricle: The cavity size was normal. There was moderate  concentric hypertrophy. Systolic function was vigorous. The  estimated ejection fraction was in the range of 65% to 70%. Wall  motion was normal; there were no regional wall motion  abnormalities. Doppler parameters are consistent with abnormal  left ventricular relaxation (grade 1 diastolic dysfunction).  Doppler parameters are consistent with elevated ventricular  end-diastolic filling pressure.  - Aortic valve: Trileaflet; mildly thickened, mildly calcified  leaflets. Transvalvular velocity was within the normal range.  There was no stenosis. There was trivial regurgitation.  - Aortic root: The aortic root was normal in size.  - Ascending aorta: The ascending aorta was normal in size.  - Mitral valve: Calcified annulus. Mildly thickened leaflets .  - Left atrium: The atrium was mildly dilated.  - Right ventricle:  The cavity size was normal. Wall thickness was  normal. Systolic function was normal.  - Tricuspid valve: There was mild regurgitation.  - Pulmonic valve: There was trivial regurgitation.  - Pulmonary arteries: PA peak pressure: 35 mm Hg (S).  - Inferior vena cava: The vessel was normal in size. The  respirophasic diameter changes were in the normal range (= 50%),  consistent with normal central venous pressure.  Stress Test 2016   Nuclear stress EF: 83%.   There was no ST segment deviation noted during stress.   No T wave inversion was noted during stress.   Defect 1: There is a small defect of moderate severity present in the mid anteroseptal and apical anterior location. Likely due to breast attenuation, but cannot rule out infarct. No reversible ischemia.   This is a low risk study.   The study is normal.   The left ventricular ejection fraction is hyperdynamic (>65%).   Neuro/Psych Seizures -, Well Controlled,  PSYCHIATRIC DISORDERS Depression Dementia    GI/Hepatic Neg liver ROS, GERD  Controlled,  Endo/Other  negative endocrine ROS  Renal/GU negative Renal ROS  negative genitourinary   Musculoskeletal  (+) Arthritis ,   Abdominal   Peds  Hematology negative hematology ROS (+)   Anesthesia Other Findings   Reproductive/Obstetrics  Anesthesia Physical Anesthesia Plan  ASA: III  Anesthesia Plan: General and Regional   Post-op Pain Management:  Regional for Post-op pain   Induction: Intravenous  PONV Risk Score and Plan: 3 and Ondansetron, Dexamethasone and Treatment may vary due to age or medical condition  Airway Management Planned: LMA  Additional Equipment:   Intra-op Plan:   Post-operative Plan: Extubation in OR  Informed Consent: I have reviewed the patients History and Physical, chart, labs and discussed the procedure including the risks, benefits and alternatives for the  proposed anesthesia with the patient or authorized representative who has indicated his/her understanding and acceptance.     Dental advisory given  Plan Discussed with: CRNA  Anesthesia Plan Comments:         Anesthesia Quick Evaluation  Anesthesia Physical Anesthesia Plan  ASA:   Anesthesia Plan:    Post-op Pain Management:    Induction:   PONV Risk Score and Plan:   Airway Management Planned:   Additional Equipment:   Intra-op Plan:   Post-operative Plan:   Informed Consent:   Plan Discussed with:   Anesthesia Plan Comments: ( )       Anesthesia Quick Evaluation

## 2020-05-29 NOTE — Progress Notes (Signed)
Anesthesia Chart Review:   Case: [id number] Date/Time: 05/30/20 1254   Procedure: OPEN REDUCTION INTERNAL (ORIF) FIXATION PATELLA (Left Knee)   Anesthesia type: Choice   Pre-op diagnosis: FRACTURE OF PATELLA  DERANGEMENTS OF LEFT KNEE   [facility] 08 [facility]   Surgeons: [name], MD      DISCUSSION:  Pt is an 87 year old with hx HTN, mitral valve disorder, emphysema, dementia, seizure disorder   S/p ORIF L patella 05/02/20   VS: Ht 5\' 5"  (1.651 m)   Wt 72.6 kg   LMP 02/16/1975   BMI 26.63 kg/m    PROVIDERS: - PCP is [name], MD - Saw cardiologist Candee Furbish, MD in 2016 and 2017 for coronary calcifications on CT scan. Echo and stress test reassuring at that time. PRN f/u recommended   LABS: Labs reviewed: Acceptable for surgery. (all labs ordered are listed, but only abnormal results are displayed)  Labs Reviewed  BASIC METABOLIC PANEL - Abnormal; Notable for the following components:      Result Value   BUN 27 (*)    GFR, Estimated 59 (*)    All other components within normal limits  CBC - Abnormal; Notable for the following components:   HCT 46.5 (*)    All other components within normal limits    EKG 05/29/20:   Sinus rhythm with 1st degree A-V block  Possible Left atrial enlargement  RSR' or QR pattern in V1 suggests right ventricular conduction delay  Left anterior fascicular block  Possible Anterior infarct , age undetermined   CV: Nuclear stress test 01/19/15:  Nuclear stress EF: 83%.   There was no ST segment deviation noted during stress.   No T wave inversion was noted during stress.   Defect 1: There is a small defect of moderate severity present in the mid anteroseptal and apical anterior location. Likely due to breast attenuation, but cannot rule out infarct. No reversible ischemia.   This is a low risk study.   The study is normal.   The left ventricular ejection fraction is hyperdynamic (>65%). -  Reassuring test.   Echo 01/19/15:  - Left ventricle: The cavity size was normal. There was moderate concentric hypertrophy. Systolic function was vigorous. The estimated ejection fraction was in the range of 65% to 70%. Wall motion was normal; there were no regional wall motion abnormalities. Doppler parameters are consistent with abnormal left ventricular relaxation (grade 1 diastolic dysfunction). Doppler parameters are consistent with elevated ventricular end-diastolic filling pressure.  - Aortic valve: Trileaflet; mildly thickened, mildly calcified leaflets. Transvalvular velocity was within the normal range. There was no stenosis. There was trivial regurgitation.  - Aortic root: The aortic root was normal in size.  - Ascending aorta: The ascending aorta was normal in size.  - Mitral valve: Calcified annulus. Mildly thickened leaflets .  - Left atrium: The atrium was mildly dilated.  - Right ventricle: The cavity size was normal. Wall thickness was normal. Systolic function was normal.  - Tricuspid valve: There was mild regurgitation.  - Pulmonic valve: There was trivial regurgitation.  - Pulmonary arteries: PA peak pressure: 35 mm Hg (S).  - Inferior vena cava: The vessel was normal in size. The respirophasic diameter changes were in the normal range (= 50%), consistent with normal central venous pressure.   Past Medical History:  Diagnosis Date  . Acute bronchitis 05/23/2011  . Acute upper respiratory infections of unspecified site 05/23/2011  . Arthritis   .  Chronic airway obstruction, not elsewhere classified 05/23/2011  . Disturbance of salivary secretion 01/31/2011  . Dizziness and giddiness 01/31/2011  . Essential tremor 04/25/2014  . External hemorrhoids without mention of complication 27/25/3664  . Gait disorder 04/25/2014  . Insomnia, unspecified 09/12/2011  . Lumbago 01/31/2011  . Major depressive disorder, single episode, unspecified 01/31/2011  . Memory disorder 04/25/2014  .  Mitral valve disorders(424.0) 01/31/2011  . Other and unspecified hyperlipidemia 01/31/2011  . Other convulsions 01/31/2011  . Other emphysema ([place]) 01/31/2011  . Pain in joint, site unspecified 01/31/2011  . Restless legs syndrome (RLS) 09/12/2011  . Retinal detachment with retinal defect of right eye 2011   right eye twice  . Seizure disorder ([place])   . Senile osteoporosis 01/31/2011  . Spontaneous ecchymoses 01/31/2011  . Stiffness of joints, not elsewhere classified, multiple sites 01/31/2011  . Unspecified essential hypertension 01/31/2011    Past Surgical History:  Procedure Laterality Date  . ABDOMINAL HYSTERECTOMY  06/21/2003   TAH/BSO, omenectomy PSB resect, Stg IC cystadenofibroma  . CHOLECYSTECTOMY  2005   Dr. Marlou Starks  . ELBOW SURGERY Right 2008   broken   Dr. Apolonio Schneiders  . EYE SURGERY    . ORIF PATELLA Left 05/02/2020   Procedure: OPEN REDUCTION INTERNAL (ORIF) FIXATION LEFT PATELLA WITH MEDIAL AND LATERAL LIGAMENT REINFORCEMENTS;  Surgeon: [name], MD;  Location[facility];  Service: Orthopedics;  Laterality: Left;  . RETINAL DETACHMENT SURGERY N/A    two  . REVERSE SHOULDER ARTHROPLASTY Left 05/06/2019   Procedure: REVERSE SHOULDER ARTHROPLASTY;  Surgeon: Justice Britain, MD;  Location: [facility];  Service: Orthopedics;  Laterality: Left;  147min  . ROTATOR CUFF REPAIR Right 2012   Dr. Theda Sers  . SQUAMOUS CELL CARCINOMA EXCISION Bilateral 2012, 8/14   [place] on legs   Dr. Sarajane Jews  . TONSILLECTOMY  1941  . VIDEO BRONCHOSCOPY WITH ENDOBRONCHIAL NAVIGATION N/A 11/29/2015   Procedure: VIDEO BRONCHOSCOPY WITH ENDOBRONCHIAL NAVIGATION;  Surgeon: Collene Gobble, MD;  Location: [facility];  Service: Thoracic;  Laterality: N/A;    MEDICATIONS: . acetaminophen (TYLENOL) 500 MG tablet  . amLODipine (NORVASC) 2.5 MG tablet  . aspirin 81 MG chewable tablet  . celecoxib (CELEBREX) 200 MG capsule  . cholecalciferol (VITAMIN D) 1000 units tablet  . Emollient (CERAVE) CREA  . furosemide  (LASIX) 40 MG tablet  . hydrocortisone 2.5 % lotion  . ketoconazole (NIZORAL) 2 % cream  . lamoTRIgine (LAMICTAL) 150 MG tablet  . levETIRAcetam (KEPPRA) 250 MG tablet  . levETIRAcetam (KEPPRA) 500 MG tablet  . memantine (NAMENDA) 10 MG tablet  . nystatin (MYCOSTATIN/NYSTOP) powder  . omeprazole (PRILOSEC) 20 MG capsule  . oxyCODONE (OXY IR/ROXICODONE) 5 MG immediate release tablet  . polyethylene glycol (MIRALAX / GLYCOLAX) 17 g packet  . Potassium Chloride ER 20 MEQ TBCR  . pramipexole (MIRAPEX) 0.25 MG tablet  . sennosides-docusate sodium (SENOKOT-S) 8.6-50 MG tablet  . simvastatin (ZOCOR) 10 MG tablet  . tiotropium (SPIRIVA HANDIHALER) 18 MCG inhalation capsule  . triamcinolone (KENALOG) 0.1 %   No current facility-administered medications for this encounter.    If no changes, I anticipate pt can proceed with surgery as scheduled.   Willeen Cass, PhD, FNP-BC [facility]/Anesthesiology Phone: [phone] 05/29/2020 1:18 PM

## 2020-05-30 ENCOUNTER — Ambulatory Visit (HOSPITAL_COMMUNITY): Payer: Medicare PPO | Admitting: Anesthesiology

## 2020-05-30 ENCOUNTER — Ambulatory Visit (HOSPITAL_COMMUNITY): Payer: Medicare PPO | Admitting: Emergency Medicine

## 2020-05-30 ENCOUNTER — Encounter (HOSPITAL_COMMUNITY)
Admission: RE | Disposition: A | Discharge status: Skilled Nursing Facility | Payer: Self-pay | Source: Home / Self Care | Attending: Orthopedic Surgery

## 2020-05-30 ENCOUNTER — Encounter (HOSPITAL_COMMUNITY): Payer: Self-pay | Admitting: Orthopedic Surgery

## 2020-05-30 ENCOUNTER — Ambulatory Visit (HOSPITAL_COMMUNITY)
Admission: RE | Admit: 2020-05-30 | Discharge: 2020-05-30 | Disposition: A | Discharge status: Skilled Nursing Facility | Payer: Medicare PPO | Attending: Orthopedic Surgery | Admitting: Orthopedic Surgery

## 2020-05-30 DIAGNOSIS — K5901 Slow transit constipation: Secondary | ICD-10-CM | POA: Diagnosis not present

## 2020-05-30 DIAGNOSIS — S82002D Unspecified fracture of left patella, subsequent encounter for closed fracture with routine healing: Secondary | ICD-10-CM | POA: Diagnosis not present

## 2020-05-30 DIAGNOSIS — S82001K Unspecified fracture of right patella, subsequent encounter for closed fracture with nonunion: Secondary | ICD-10-CM | POA: Diagnosis present

## 2020-05-30 DIAGNOSIS — Z79899 Other long term (current) drug therapy: Secondary | ICD-10-CM | POA: Diagnosis not present

## 2020-05-30 DIAGNOSIS — M159 Polyosteoarthritis, unspecified: Secondary | ICD-10-CM | POA: Diagnosis not present

## 2020-05-30 DIAGNOSIS — Y831 Surgical operation with implant of artificial internal device as the cause of abnormal reaction of the patient, or of later complication, without mention of misadventure at the time of the procedure: Secondary | ICD-10-CM | POA: Insufficient documentation

## 2020-05-30 DIAGNOSIS — Z85828 Personal history of other malignant neoplasm of skin: Secondary | ICD-10-CM | POA: Diagnosis not present

## 2020-05-30 DIAGNOSIS — Z888 Allergy status to other drugs, medicaments and biological substances status: Secondary | ICD-10-CM | POA: Insufficient documentation

## 2020-05-30 DIAGNOSIS — G40909 Epilepsy, unspecified, not intractable, without status epilepticus: Secondary | ICD-10-CM | POA: Diagnosis not present

## 2020-05-30 DIAGNOSIS — S83422A Sprain of lateral collateral ligament of left knee, initial encounter: Secondary | ICD-10-CM | POA: Diagnosis not present

## 2020-05-30 DIAGNOSIS — G8918 Other acute postprocedural pain: Secondary | ICD-10-CM | POA: Diagnosis not present

## 2020-05-30 DIAGNOSIS — Z96612 Presence of left artificial shoulder joint: Secondary | ICD-10-CM | POA: Insufficient documentation

## 2020-05-30 DIAGNOSIS — Z7982 Long term (current) use of aspirin: Secondary | ICD-10-CM | POA: Insufficient documentation

## 2020-05-30 DIAGNOSIS — G2581 Restless legs syndrome: Secondary | ICD-10-CM | POA: Diagnosis not present

## 2020-05-30 DIAGNOSIS — M7021 Olecranon bursitis, right elbow: Secondary | ICD-10-CM | POA: Diagnosis not present

## 2020-05-30 DIAGNOSIS — Z91040 Latex allergy status: Secondary | ICD-10-CM | POA: Insufficient documentation

## 2020-05-30 DIAGNOSIS — K219 Gastro-esophageal reflux disease without esophagitis: Secondary | ICD-10-CM | POA: Diagnosis not present

## 2020-05-30 DIAGNOSIS — T8484XA Pain due to internal orthopedic prosthetic devices, implants and grafts, initial encounter: Secondary | ICD-10-CM | POA: Diagnosis not present

## 2020-05-30 DIAGNOSIS — Z87891 Personal history of nicotine dependence: Secondary | ICD-10-CM | POA: Diagnosis not present

## 2020-05-30 DIAGNOSIS — M25561 Pain in right knee: Secondary | ICD-10-CM | POA: Diagnosis not present

## 2020-05-30 DIAGNOSIS — M810 Age-related osteoporosis without current pathological fracture: Secondary | ICD-10-CM | POA: Diagnosis not present

## 2020-05-30 DIAGNOSIS — F0390 Unspecified dementia without behavioral disturbance: Secondary | ICD-10-CM | POA: Diagnosis not present

## 2020-05-30 DIAGNOSIS — R609 Edema, unspecified: Secondary | ICD-10-CM | POA: Diagnosis not present

## 2020-05-30 DIAGNOSIS — S82035D Nondisplaced transverse fracture of left patella, subsequent encounter for closed fracture with routine healing: Secondary | ICD-10-CM | POA: Diagnosis not present

## 2020-05-30 DIAGNOSIS — Z791 Long term (current) use of non-steroidal anti-inflammatories (NSAID): Secondary | ICD-10-CM | POA: Insufficient documentation

## 2020-05-30 DIAGNOSIS — M25562 Pain in left knee: Secondary | ICD-10-CM | POA: Diagnosis not present

## 2020-05-30 DIAGNOSIS — S83412A Sprain of medial collateral ligament of left knee, initial encounter: Secondary | ICD-10-CM | POA: Diagnosis not present

## 2020-05-30 DIAGNOSIS — M2392 Unspecified internal derangement of left knee: Secondary | ICD-10-CM | POA: Insufficient documentation

## 2020-05-30 DIAGNOSIS — Z9889 Other specified postprocedural states: Secondary | ICD-10-CM | POA: Diagnosis not present

## 2020-05-30 DIAGNOSIS — S82035K Nondisplaced transverse fracture of left patella, subsequent encounter for closed fracture with nonunion: Secondary | ICD-10-CM | POA: Diagnosis not present

## 2020-05-30 DIAGNOSIS — S82002A Unspecified fracture of left patella, initial encounter for closed fracture: Secondary | ICD-10-CM | POA: Diagnosis not present

## 2020-05-30 DIAGNOSIS — J410 Simple chronic bronchitis: Secondary | ICD-10-CM | POA: Diagnosis not present

## 2020-05-30 DIAGNOSIS — I2510 Atherosclerotic heart disease of native coronary artery without angina pectoris: Secondary | ICD-10-CM | POA: Diagnosis not present

## 2020-05-30 DIAGNOSIS — I10 Essential (primary) hypertension: Secondary | ICD-10-CM | POA: Diagnosis not present

## 2020-05-30 HISTORY — DX: Dyspnea, unspecified: R06.00

## 2020-05-30 HISTORY — PX: ORIF PATELLA: SHX5033

## 2020-05-30 SURGERY — OPEN REDUCTION INTERNAL FIXATION (ORIF) PATELLA
Anesthesia: General | Site: Knee | Laterality: Left

## 2020-05-30 MED ORDER — FENTANYL CITRATE (PF) 100 MCG/2ML IJ SOLN
INTRAMUSCULAR | Status: AC
  Filled 2020-05-30: qty 2

## 2020-05-30 MED ORDER — ONDANSETRON HCL 4 MG/2ML IJ SOLN
INTRAMUSCULAR | Status: AC
  Filled 2020-05-30: qty 2

## 2020-05-30 MED ORDER — LACTATED RINGERS IV SOLN: INTRAVENOUS | Status: DC

## 2020-05-30 MED ORDER — FENTANYL CITRATE (PF) 100 MCG/2ML IJ SOLN
25.0000 ug | INTRAMUSCULAR | Status: DC | PRN
  Administered 2020-05-30 (×2): 25 ug via INTRAVENOUS
  Administered 2020-05-30: 50 ug via INTRAVENOUS

## 2020-05-30 MED ORDER — ORAL CARE MOUTH RINSE: 15.0000 mL | Freq: Once | OROMUCOSAL | Status: AC

## 2020-05-30 MED ORDER — PHENYLEPHRINE 40 MCG/ML (10ML) SYRINGE FOR IV PUSH (FOR BLOOD PRESSURE SUPPORT)
PREFILLED_SYRINGE | INTRAVENOUS | Status: DC | PRN
  Administered 2020-05-30 (×10): 80 ug via INTRAVENOUS

## 2020-05-30 MED ORDER — PHENYLEPHRINE 40 MCG/ML (10ML) SYRINGE FOR IV PUSH (FOR BLOOD PRESSURE SUPPORT)
PREFILLED_SYRINGE | INTRAVENOUS | Status: AC
  Filled 2020-05-30: qty 10

## 2020-05-30 MED ORDER — 0.9 % SODIUM CHLORIDE (POUR BTL) OPTIME
TOPICAL | Status: DC | PRN
  Administered 2020-05-30: 1000 mL

## 2020-05-30 MED ORDER — ROPIVACAINE HCL 7.5 MG/ML IJ SOLN
INTRAMUSCULAR | Status: DC | PRN
  Administered 2020-05-30: 20 mL via PERINEURAL

## 2020-05-30 MED ORDER — ACETAMINOPHEN 500 MG PO TABS
1000.0000 mg | ORAL_TABLET | Freq: Once | ORAL | Status: AC
  Administered 2020-05-30: 1000 mg via ORAL
  Filled 2020-05-30: qty 2

## 2020-05-30 MED ORDER — ALBUMIN HUMAN 5 % IV SOLN: INTRAVENOUS | Status: DC | PRN

## 2020-05-30 MED ORDER — DEXAMETHASONE SODIUM PHOSPHATE 10 MG/ML IJ SOLN
INTRAMUSCULAR | Status: DC | PRN
  Administered 2020-05-30: 10 mg

## 2020-05-30 MED ORDER — CHLORHEXIDINE GLUCONATE 0.12 % MT SOLN
15.0000 mL | Freq: Once | OROMUCOSAL | Status: AC
  Administered 2020-05-30: 15 mL via OROMUCOSAL

## 2020-05-30 MED ORDER — HYDROCODONE-ACETAMINOPHEN 10-325 MG PO TABS: 1.0000 | ORAL_TABLET | Freq: Four times a day (QID) | ORAL | 0 refills | Status: DC | PRN

## 2020-05-30 MED ORDER — LIDOCAINE 2% (20 MG/ML) 5 ML SYRINGE
INTRAMUSCULAR | Status: DC | PRN
  Administered 2020-05-30: 40 mg via INTRAVENOUS

## 2020-05-30 MED ORDER — ALBUMIN HUMAN 5 % IV SOLN
INTRAVENOUS | Status: AC
  Filled 2020-05-30: qty 250

## 2020-05-30 MED ORDER — ONDANSETRON HCL 4 MG/2ML IJ SOLN
INTRAMUSCULAR | Status: DC | PRN
  Administered 2020-05-30: 4 mg via INTRAVENOUS

## 2020-05-30 MED ORDER — TRANEXAMIC ACID-NACL 1000-0.7 MG/100ML-% IV SOLN
1000.0000 mg | INTRAVENOUS | Status: AC
  Administered 2020-05-30: 1000 mg via INTRAVENOUS
  Filled 2020-05-30: qty 100

## 2020-05-30 MED ORDER — BACLOFEN 10 MG PO TABS: 10.0000 mg | ORAL_TABLET | Freq: Every day | ORAL | 0 refills | Status: AC | PRN

## 2020-05-30 MED ORDER — PHENYLEPHRINE 40 MCG/ML (10ML) SYRINGE FOR IV PUSH (FOR BLOOD PRESSURE SUPPORT)
PREFILLED_SYRINGE | INTRAVENOUS | Status: AC
  Filled 2020-05-30: qty 20

## 2020-05-30 MED ORDER — PROPOFOL 10 MG/ML IV BOLUS
INTRAVENOUS | Status: DC | PRN
  Administered 2020-05-30: 100 mg via INTRAVENOUS

## 2020-05-30 MED ORDER — CEFAZOLIN SODIUM-DEXTROSE 2-4 GM/100ML-% IV SOLN
2.0000 g | INTRAVENOUS | Status: AC
  Administered 2020-05-30: 2 g via INTRAVENOUS
  Filled 2020-05-30: qty 100

## 2020-05-30 MED ORDER — ASPIRIN 81 MG PO CHEW: 81.0000 mg | CHEWABLE_TABLET | Freq: Two times a day (BID) | ORAL | 0 refills | Status: DC

## 2020-05-30 MED ORDER — PROPOFOL 10 MG/ML IV BOLUS
INTRAVENOUS | Status: AC
  Filled 2020-05-30: qty 20

## 2020-05-30 MED ORDER — AMISULPRIDE (ANTIEMETIC) 5 MG/2ML IV SOLN: 10.0000 mg | Freq: Once | INTRAVENOUS | Status: DC | PRN

## 2020-05-30 MED ORDER — ONDANSETRON HCL 4 MG PO TABS: 4.0000 mg | ORAL_TABLET | Freq: Every day | ORAL | 0 refills | Status: DC | PRN

## 2020-05-30 MED ORDER — FENTANYL CITRATE (PF) 100 MCG/2ML IJ SOLN
50.0000 ug | INTRAMUSCULAR | Status: DC
  Administered 2020-05-30: 100 ug via INTRAVENOUS
  Filled 2020-05-30: qty 2

## 2020-05-30 MED ORDER — ACETAMINOPHEN 500 MG PO TABS: 1000.0000 mg | ORAL_TABLET | Freq: Once | ORAL | Status: DC

## 2020-05-30 MED ORDER — BUPIVACAINE HCL (PF) 0.5 % IJ SOLN
INTRAMUSCULAR | Status: AC
  Filled 2020-05-30: qty 30

## 2020-05-30 SURGICAL SUPPLY — 76 items
APL PRP STRL LF DISP 70% ISPRP (MISCELLANEOUS) ×1
BANDAGE ESMARK 6X9 LF (GAUZE/BANDAGES/DRESSINGS) ×1 IMPLANT
BIT DRILL 2.6 CANN (BIT) ×2 IMPLANT
BLADE SURG 15 STRL LF DISP TIS (BLADE) ×2 IMPLANT
BLADE SURG 15 STRL SS (BLADE) ×4
BNDG CMPR 9X6 STRL LF SNTH (GAUZE/BANDAGES/DRESSINGS) ×1
BNDG CMPR MED 10X6 ELC LF (GAUZE/BANDAGES/DRESSINGS) ×1
BNDG COHESIVE 4X5 TAN STRL (GAUZE/BANDAGES/DRESSINGS) IMPLANT
BNDG ELASTIC 4X5.8 VLCR STR LF (GAUZE/BANDAGES/DRESSINGS) ×2 IMPLANT
BNDG ELASTIC 6X10 VLCR STRL LF (GAUZE/BANDAGES/DRESSINGS) ×2 IMPLANT
BNDG ESMARK 6X9 LF (GAUZE/BANDAGES/DRESSINGS) ×2
CHLORAPREP W/TINT 26 (MISCELLANEOUS) ×2 IMPLANT
CLSR STERI-STRIP ANTIMIC 1/2X4 (GAUZE/BANDAGES/DRESSINGS) IMPLANT
COVER WAND RF STERILE (DRAPES) ×2 IMPLANT
CUFF TOURN SGL QUICK 24 (TOURNIQUET CUFF) ×2
CUFF TOURN SGL QUICK 34 (TOURNIQUET CUFF)
CUFF TRNQT CYL 24X4X16.5-23 (TOURNIQUET CUFF) ×1 IMPLANT
CUFF TRNQT CYL 34X4.125X (TOURNIQUET CUFF) IMPLANT
DECANTER SPIKE VIAL GLASS SM (MISCELLANEOUS) IMPLANT
DRAPE OEC MINIVIEW 54X84 (DRAPES) ×2 IMPLANT
DRAPE U-SHAPE 47X51 STRL (DRAPES) IMPLANT
DRSG EMULSION OIL 3X3 NADH (GAUZE/BANDAGES/DRESSINGS) IMPLANT
DRSG PAD ABDOMINAL 8X10 ST (GAUZE/BANDAGES/DRESSINGS) ×2 IMPLANT
ELECT REM PT RETURN 15FT ADLT (MISCELLANEOUS) ×2 IMPLANT
FIBERTAPE 2 W/STRL NDL 17 (SUTURE) ×2 IMPLANT
GAUZE SPONGE 4X4 12PLY STRL (GAUZE/BANDAGES/DRESSINGS) ×2 IMPLANT
GLOVE SRG 8 PF TXTR STRL LF DI (GLOVE) ×2 IMPLANT
GLOVE SURG ENC MOIS LTX SZ7.5 (GLOVE) ×4 IMPLANT
GLOVE SURG UNDER POLY LF SZ8 (GLOVE) ×4
GOWN STRL REUS W/ TWL LRG LVL3 (GOWN DISPOSABLE) ×4 IMPLANT
GOWN STRL REUS W/ TWL XL LVL3 (GOWN DISPOSABLE) ×1 IMPLANT
GOWN STRL REUS W/TWL LRG LVL3 (GOWN DISPOSABLE) ×8
GOWN STRL REUS W/TWL XL LVL3 (GOWN DISPOSABLE) ×2
GUIDEWIRE 1.35MM  DUAL TROCAR (WIRE) ×4
GUIDEWIRE 1.35MM DUAL TROCAR (WIRE) ×2 IMPLANT
IMMOBILIZER KNEE 22 UNIV (SOFTGOODS) IMPLANT
IMMOBILIZER KNEE 24 THIGH 36 (MISCELLANEOUS) IMPLANT
IMMOBILIZER KNEE 24 UNIV (MISCELLANEOUS)
KIT BASIN OR (CUSTOM PROCEDURE TRAY) ×2 IMPLANT
NEEDLE HYPO 22GX1.5 SAFETY (NEEDLE) IMPLANT
NS IRRIG 1000ML POUR BTL (IV SOLUTION) ×2 IMPLANT
PACK ORTHO EXTREMITY (CUSTOM PROCEDURE TRAY) IMPLANT
PAD CAST 4YDX4 CTTN HI CHSV (CAST SUPPLIES) ×1 IMPLANT
PADDING CAST ABS 6INX4YD NS (CAST SUPPLIES) ×2
PADDING CAST ABS COTTON 6X4 NS (CAST SUPPLIES) ×2 IMPLANT
PADDING CAST COTTON 4X4 STRL (CAST SUPPLIES) ×2
PADDING CAST COTTON 6X4 STRL (CAST SUPPLIES) ×4 IMPLANT
PENCIL SMOKE EVACUATOR (MISCELLANEOUS) ×2 IMPLANT
SCREW CANN BLUNT TIP LP 4X46 (Screw) ×2 IMPLANT
SCREW CANN LP BT 4X44 (Screw) ×2 IMPLANT
SLEEVE SCD COMPRESS KNEE MED (MISCELLANEOUS) IMPLANT
SPLINT FAST PLASTER 5X30 (CAST SUPPLIES) ×20
SPLINT PLASTER CAST FAST 5X30 (CAST SUPPLIES) ×20 IMPLANT
SPLINT PLASTER CAST XFAST 5X30 (CAST SUPPLIES) ×1 IMPLANT
SPLINT PLASTER XFAST SET 5X30 (CAST SUPPLIES) ×1
SPONGE LAP 4X18 RFD (DISPOSABLE) ×2 IMPLANT
STRIP CLOSURE SKIN 1/2X4 (GAUZE/BANDAGES/DRESSINGS) ×2 IMPLANT
SUCTION FRAZIER HANDLE 10FR (MISCELLANEOUS)
SUCTION TUBE FRAZIER 10FR DISP (MISCELLANEOUS) IMPLANT
SUT ETHILON 3 0 PS 1 (SUTURE) IMPLANT
SUT MON AB 2-0 CT1 36 (SUTURE) ×2 IMPLANT
SUT MON AB 4-0 PC3 18 (SUTURE) IMPLANT
SUT VIC AB 0 CT1 27 (SUTURE) ×4
SUT VIC AB 0 CT1 27XBRD ANBCTR (SUTURE) ×2 IMPLANT
SUT VIC AB 0 SH 27 (SUTURE) IMPLANT
SUT VIC AB 2-0 CT1 27 (SUTURE) ×4
SUT VIC AB 2-0 CT1 TAPERPNT 27 (SUTURE) ×2 IMPLANT
SUT VIC AB 2-0 SH 27 (SUTURE)
SUT VIC AB 2-0 SH 27XBRD (SUTURE) IMPLANT
SYR BULB EAR ULCER 3OZ GRN STR (SYRINGE) ×2 IMPLANT
TOWEL OR NON WOVEN STRL DISP B (DISPOSABLE) ×2 IMPLANT
TUBING CONNECTING 10 (TUBING) IMPLANT
UNDERPAD 30X36 HEAVY ABSORB (UNDERPADS AND DIAPERS) ×2 IMPLANT
WASHER (Orthopedic Implant) ×4 IMPLANT
WASHER ORTHO 7X (Orthopedic Implant) ×2 IMPLANT
YANKAUER SUCT BULB TIP NO VENT (SUCTIONS) ×2 IMPLANT

## 2020-05-30 NOTE — Anesthesia Procedure Notes (Signed)
Anesthesia Regional Block: Femoral nerve block   Pre-Anesthetic Checklist: ,, timeout performed, Correct Patient, Correct Site, Correct Laterality, Correct Procedure,, site marked, risks and benefits discussed, Surgical consent, Pre-op evaluation,  At surgeon's request  Laterality: Left  Prep: chloraprep       Needles:  Injection technique: Single-shot      Needle Gauge: 22     Additional Needles:   Procedures:,,,, ultrasound used (permanent image in chart),,,,  Narrative:  Start time: 05/30/2020 11:41 AM End time: 05/30/2020 11:51 AM Injection made incrementally with aspirations every 5 mL.  Performed by: Personally   Additional Notes: A functioning IV was confirmed and monitors were applied.  Sterile prep and drape, hand hygiene and sterile gloves were used.  Negative aspiration prior to incremental administration of local anesthetic using the 22 ga needle. The patient tolerated the procedure well.

## 2020-05-30 NOTE — Progress Notes (Signed)
Assisted Dr. Tobias Alexander with left, ultrasound guided, femoral block. Side rails up, monitors on throughout procedure. See vital signs in flow sheet. Tolerated Procedure well.

## 2020-05-30 NOTE — Anesthesia Preprocedure Evaluation (Addendum)
Anesthesia Evaluation  Patient identified by MRN, date of birth, ID band Patient awake    Reviewed: Allergy & Precautions, NPO status , Patient's Chart, lab work & pertinent test results  History of Anesthesia Complications Negative for: history of anesthetic complications  Airway Mallampati: II  TM Distance: >3 FB Neck ROM: Full    Dental  (+) Partial Upper, Dental Advisory Given, Edentulous Lower   Pulmonary COPD,  COPD inhaler, former smoker,    Pulmonary exam normal        Cardiovascular hypertension, Pt. on medications + CAD and + Peripheral Vascular Disease  Normal cardiovascular exam  TTE 2016 - Left ventricle: The cavity size was normal. There was moderate  concentric hypertrophy. Systolic function was vigorous. The  estimated ejection fraction was in the range of 65% to 70%. Wall  motion was normal; there were no regional wall motion  abnormalities. Doppler parameters are consistent with abnormal  left ventricular relaxation (grade 1 diastolic dysfunction).  Doppler parameters are consistent with elevated ventricular  end-diastolic filling pressure.  - Aortic valve: Trileaflet; mildly thickened, mildly calcified  leaflets. Transvalvular velocity was within the normal range.  There was no stenosis. There was trivial regurgitation.  - Aortic root: The aortic root was normal in size.  - Ascending aorta: The ascending aorta was normal in size.  - Mitral valve: Calcified annulus. Mildly thickened leaflets .  - Left atrium: The atrium was mildly dilated.  - Right ventricle: The cavity size was normal. Wall thickness was  normal. Systolic function was normal.  - Tricuspid valve: There was mild regurgitation.  - Pulmonic valve: There was trivial regurgitation.  - Pulmonary arteries: PA peak pressure: 35 mm Hg (S).  - Inferior vena cava: The vessel was normal in size. The  respirophasic diameter  changes were in the normal range (= 50%),  consistent with normal central venous pressure.  Stress Test 2016   Nuclear stress EF: 83%.   There was no ST segment deviation noted during stress.   No T wave inversion was noted during stress.   Defect 1: There is a small defect of moderate severity present in the mid anteroseptal and apical anterior location. Likely due to breast attenuation, but cannot rule out infarct. No reversible ischemia.   This is a low risk study.   The study is normal.   The left ventricular ejection fraction is hyperdynamic (>65%).   Neuro/Psych Seizures -, Well Controlled,  PSYCHIATRIC DISORDERS Depression Dementia    GI/Hepatic Neg liver ROS, GERD  Controlled,  Endo/Other  negative endocrine ROS  Renal/GU negative Renal ROS  negative genitourinary   Musculoskeletal  (+) Arthritis ,   Abdominal   Peds  Hematology negative hematology ROS (+)   Anesthesia Other Findings   Reproductive/Obstetrics                            Anesthesia Physical  Anesthesia Plan  ASA: III  Anesthesia Plan: General   Post-op Pain Management:  Regional for Post-op pain   Induction: Intravenous  PONV Risk Score and Plan: 3 and Ondansetron, Dexamethasone and Treatment may vary due to age or medical condition  Airway Management Planned: LMA  Additional Equipment:   Intra-op Plan:   Post-operative Plan: Extubation in OR  Informed Consent: I have reviewed the patients History and Physical, chart, labs and discussed the procedure including the risks, benefits and alternatives for the proposed anesthesia with the patient or authorized representative  who has indicated his/her understanding and acceptance.     Dental advisory given  Plan Discussed with: Anesthesiologist, CRNA and Surgeon  Anesthesia Plan Comments:        Anesthesia Quick Evaluation

## 2020-05-30 NOTE — Anesthesia Procedure Notes (Signed)
Procedure Name: LMA Insertion Date/Time: 05/30/2020 1:23 PM Performed by: Michele Rockers, CRNA Pre-anesthesia Checklist: Patient identified, Emergency Drugs available, Suction available, Patient being monitored and Timeout performed Patient Re-evaluated:Patient Re-evaluated prior to induction Oxygen Delivery Method: Circle system utilized Preoxygenation: Pre-oxygenation with 100% oxygen Induction Type: IV induction Ventilation: Mask ventilation without difficulty LMA Size: 4.0 Placement Confirmation: breath sounds checked- equal and bilateral Dental Injury: Teeth and Oropharynx as per pre-operative assessment

## 2020-05-30 NOTE — Anesthesia Postprocedure Evaluation (Signed)
Anesthesia Post Note  Patient: [name]  Procedure(s) Performed: OPEN REDUCTION INTERNAL (ORIF) FIXATION PATELLA (Left Knee)     Patient location during evaluation: PACU Anesthesia Type: General Level of consciousness: awake and alert Pain management: pain level controlled Vital Signs Assessment: post-procedure vital signs reviewed and stable Respiratory status: spontaneous breathing, nonlabored ventilation, respiratory function stable and patient connected to nasal cannula oxygen Cardiovascular status: blood pressure returned to baseline and stable Postop Assessment: no apparent nausea or vomiting Anesthetic complications: no   No complications documented.  Last Vitals:  Vitals:   05/30/20 1454 05/30/20 1500  BP: (!) 161/92 (!) 170/104  Pulse: 85 86  Resp: (!) 21 19  Temp: (!) 36.3 C   SpO2: 100% 100%    Last Pain:  Vitals:   05/30/20 1500  TempSrc:   PainSc: 4                  Starkisha Tullis S

## 2020-05-30 NOTE — Transfer of Care (Signed)
Immediate Anesthesia Transfer of Care Note  Patient: [name]  Procedure(s) Performed: OPEN REDUCTION INTERNAL (ORIF) FIXATION PATELLA (Left Knee)  Patient Location: PACU  Anesthesia Type:General  Level of Consciousness: awake, alert , oriented and patient cooperative  Airway & Oxygen Therapy: Patient Spontanous Breathing and Patient connected to face mask oxygen  Post-op Assessment: Report given to RN, Post -op Vital signs reviewed and stable and Patient moving all extremities  Post vital signs: Reviewed and stable  Last Vitals:  Vitals Value Taken Time  BP 161/92 05/30/20 1454  Temp 36.3 C 05/30/20 1454  Pulse 83 05/30/20 1456  Resp 20 05/30/20 1456  SpO2 99 % 05/30/20 1456  Vitals shown include unvalidated device data.  Last Pain:  Vitals:   05/30/20 1454  TempSrc:   PainSc: 4       Patients Stated Pain Goal: 3 (74/[id number]/95 [id number])  Complications: No complications documented.

## 2020-05-30 NOTE — Progress Notes (Signed)
Pt discharged to [facility] via [facility] service in stable condition. [place] [phone] to give report to nurse Miquel Dunn. Discharge packet sent with patient including AVS discharge summary with instructions. Pt alert and at baseline at time of discharge.  Coolidge Breeze, RN 05/30/2020

## 2020-05-30 NOTE — Progress Notes (Signed)
[facility] [phone]) for discharge back to [facility] [phone].

## 2020-05-30 NOTE — Op Note (Signed)
05/30/2020  2:16 PM  PATIENT:  [name]    PRE-OPERATIVE DIAGNOSIS:  FRACTURE OF PATELLA  DERANGEMENTS OF LEFT KNEE  POST-OPERATIVE DIAGNOSIS:  Same  PROCEDURE:  OPEN REDUCTION INTERNAL (ORIF) FIXATION PATELLA  SURGEON:  [name], MD  PHYSICIAN ASSISTANT: Aggie Moats, PA-C, he was present and scrubbed throughout the case, critical for completion in a timely fashion, and for retraction, instrumentation, and closure.   ANESTHESIA:   General  PREOPERATIVE INDICATIONS:  [name] is a  87 y.o. female with a diagnosis of FRACTURE OF PATELLA  DERANGEMENTS OF LEFT KNEE who elected for surgical management in order to restore the function of the extensor mechanism.    The risks benefits and alternatives were discussed with the patient preoperatively including but not limited to the risks of infection, bleeding, nerve injury, cardiopulmonary complications, the need for revision surgery, hardware prominence, hardware failure, the need for hardware removal, nonunion, malunion, posttraumatic arthritis, stiffness, loss of strength and function, among others, and the patient was willing to proceed.  OPERATIVE IMPLANTS: 4.0 mm cannulated screws x2 with a total of 2 #2 FiberWire going through the cannulated screws in a figure-of-eight cerclage fashion  OPERATIVE FINDINGS: Displaced patella fracture  OPERATIVE PROCEDURE: The patient was brought to the operating room and placed in the supine position. General anesthesia was administered. IV antibiotics were given. The lower extremity was prepped and draped in usual sterile fashion. The leg was elevated and exsanguinated and the tourniquet was inflated. Time out was performed.   Anterior incision was made over the patella and the fracture fragments identified and cleaned of hematoma. The retinaculum was torn on either side.   I removed the previous hardware.   I reduced the fracture anatomically and held provisionally with a clamp and  placed 2 guidewires for the cannulated screws.  The lengths were measured, after being confirmed on C-arm, and then I placed the screws, taking care to make sure that there were threads only on the proximal segment, providing compression at the fracture site, and the tips were not prominent proximally.  C-arm used to confirm reduction and position of the screws, and once I was satisfied with this I then used a Keith needle through the screws bringing a total of 2 #2 FiberWire in a figure-of-eight type fashion. This provided excellent secondary fixation. I left the screw lengths slightly past the far cortex in order to minimize the risk for rupture of the FiberWire over the tip of the screws.  I then placed a cerclage stitch  The wounds were irrigated copiously.  I performed a repair of the collateral joint capsul and extensor retinaculum that had ruptured on the medial side. I was happy with this closure.   I repaired the collateral joint capsul and retinaculum on the lateral side. I was happy with this apposition as well  I used Vicryl for the subcutaneous tissue with Steri-Strips and sterile gauze for the skin. The wounds were also injected. A knee immobilizer was applied. The patient was awakened and returned to the PACU in stable and satisfactory condition. There were no complications.   POSTOPERATIVE PLAN: WBAT in knee immobilizer, DVT px: mobilize and chemical dvt px.

## 2020-05-30 NOTE — Interval H&P Note (Signed)
History and Physical Interval Note:  05/30/2020 11:37 AM  [name]  has presented today for surgery, with the diagnosis of FRACTURE OF PATELLA  DERANGEMENTS OF LEFT KNEE.  The various methods of treatment have been discussed with the patient and family. After consideration of risks, benefits and other options for treatment, the patient has consented to  Procedure(s): OPEN REDUCTION INTERNAL (ORIF) FIXATION PATELLA (Left) as a surgical intervention.  The patient's history has been reviewed, patient examined, no change in status, stable for surgery.  I have reviewed the patient's chart and labs.  Questions were answered to the patient's satisfaction.     [name]

## 2020-06-01 ENCOUNTER — Encounter: Payer: Self-pay | Admitting: Internal Medicine

## 2020-06-01 ENCOUNTER — Non-Acute Institutional Stay (SKILLED_NURSING_FACILITY): Payer: Medicare PPO | Admitting: Internal Medicine

## 2020-06-01 DIAGNOSIS — G40909 Epilepsy, unspecified, not intractable, without status epilepticus: Secondary | ICD-10-CM | POA: Diagnosis not present

## 2020-06-01 DIAGNOSIS — G2581 Restless legs syndrome: Secondary | ICD-10-CM

## 2020-06-01 DIAGNOSIS — M159 Polyosteoarthritis, unspecified: Secondary | ICD-10-CM

## 2020-06-01 DIAGNOSIS — Z9889 Other specified postprocedural states: Secondary | ICD-10-CM | POA: Diagnosis not present

## 2020-06-01 DIAGNOSIS — R609 Edema, unspecified: Secondary | ICD-10-CM

## 2020-06-01 DIAGNOSIS — K219 Gastro-esophageal reflux disease without esophagitis: Secondary | ICD-10-CM | POA: Diagnosis not present

## 2020-06-01 DIAGNOSIS — F0390 Unspecified dementia without behavioral disturbance: Secondary | ICD-10-CM

## 2020-06-01 DIAGNOSIS — I10 Essential (primary) hypertension: Secondary | ICD-10-CM | POA: Diagnosis not present

## 2020-06-01 DIAGNOSIS — M25562 Pain in left knee: Secondary | ICD-10-CM | POA: Diagnosis not present

## 2020-06-01 NOTE — Progress Notes (Signed)
Location: [facility] of Service:  SNF (31)  Provider:   Code Status:  Goals of Care:  Advanced Directives 05/26/2020  Does Patient Have a Medical Advance Directive? Yes  Type of Paramedic of [place];Living will  Does patient want to make changes to medical advance directive? -  Copy of [place] in Chart? -  Pre-existing out of facility DNR order (yellow form or pink MOST form) -     Chief Complaint  Patient presents with  . Acute Visit    HPI: Patient is a 87 y.o. female seen today for an acute visit for Follow up after her ORIF for Patellar Fracture  Patientalsohas h/o COPD, Hypertension,Seizure Disorder,Mild Cognitive impairment, Hyperlipidemiaand fall in 1/21 leading toLeft Humerus Fracture with Displacement   Sustained Left Patellar Fracture due to Mechanical fall Underwent ORIF on 1/25 Went for regular Follow up with Dr Percell Miller. Repeat Xray showed that it is was not healing well and she needed revision surgery on 2/22 Patient is back to SNF for therapy She is NWB in that leg and has Knee Immobilizer. Last night she was getting transferred for her ADLS and Had pain in her knee. She is concerned that something happened to her Patella. At this time she is dependent for her ADLS on Staff. Therapy is working with her. C/o Reflux Symptoms   Past Medical History:  Diagnosis Date  . Acute bronchitis 05/23/2011  . Acute upper respiratory infections of unspecified site 05/23/2011  . Arthritis   . Chronic airway obstruction, not elsewhere classified 05/23/2011  . Disturbance of salivary secretion 01/31/2011  . Dizziness and giddiness 01/31/2011  . Dyspnea   . Essential tremor 04/25/2014  . External hemorrhoids without mention of complication 49/67/5916  . Gait disorder 04/25/2014  . Insomnia, unspecified 09/12/2011  . Lumbago 01/31/2011  . Major depressive disorder, single episode, unspecified 01/31/2011  .  Memory disorder 04/25/2014  . Mitral valve disorders(424.0) 01/31/2011  . Other and unspecified hyperlipidemia 01/31/2011  . Other convulsions 01/31/2011  . Other emphysema ([place]) 01/31/2011  . Pain in joint, site unspecified 01/31/2011  . Restless legs syndrome (RLS) 09/12/2011  . Retinal detachment with retinal defect of right eye 2011   right eye twice  . Seizure disorder ([place])   . Senile osteoporosis 01/31/2011  . Spontaneous ecchymoses 01/31/2011  . Stiffness of joints, not elsewhere classified, multiple sites 01/31/2011  . Unspecified essential hypertension 01/31/2011    Past Surgical History:  Procedure Laterality Date  . ABDOMINAL HYSTERECTOMY  06/21/2003   TAH/BSO, omenectomy PSB resect, Stg IC cystadenofibroma  . CHOLECYSTECTOMY  2005   Dr. Marlou Starks  . ELBOW SURGERY Right 2008   broken   Dr. Apolonio Schneiders  . EYE SURGERY    . ORIF PATELLA Left 05/02/2020   Procedure: OPEN REDUCTION INTERNAL (ORIF) FIXATION LEFT PATELLA WITH MEDIAL AND LATERAL LIGAMENT REINFORCEMENTS;  Surgeon: [name], MD;  Location: [facility];  Service: Orthopedics;  Laterality: Left;  . ORIF PATELLA Left 05/30/2020   Procedure: OPEN REDUCTION INTERNAL (ORIF) FIXATION PATELLA;  Surgeon: [name], MD;  Location: [facility];  Service: Orthopedics;  Laterality: Left;  . RETINAL DETACHMENT SURGERY N/A    two  . REVERSE SHOULDER ARTHROPLASTY Left 05/06/2019   Procedure: REVERSE SHOULDER ARTHROPLASTY;  Surgeon: Justice Britain, MD;  Location: [facility];  Service: Orthopedics;  Laterality: Left;  154min  . ROTATOR CUFF REPAIR Right 2012   Dr. Theda Sers  . SQUAMOUS CELL CARCINOMA  EXCISION Bilateral 2012, 8/14   [place] on legs   Dr. Sarajane Jews  . TONSILLECTOMY  1941  . VIDEO BRONCHOSCOPY WITH ENDOBRONCHIAL NAVIGATION N/A 11/29/2015   Procedure: VIDEO BRONCHOSCOPY WITH ENDOBRONCHIAL NAVIGATION;  Surgeon: Collene Gobble, MD;  Location: [facility] OR;  Service: Thoracic;  Laterality: N/A;    Allergies  Allergen Reactions  . Dyazide  [Hydrochlorothiazide W-Triamterene]     Other reaction(s): her blood pressure too much  . Latex Other (See Comments)    Swelling  . Other     Other reaction(s): local red reaction   . Rofecoxib     Other reaction(s): shortness of breath  . Sulfa Antibiotics Nausea And Vomiting  . Tetracycline Hcl     Other reaction(s): Cant take due to a drug interation    Outpatient Encounter Medications as of 06/01/2020  Medication Sig  . pantoprazole (PROTONIX) 40 MG tablet Take 40 mg by mouth daily.  [name] acetaminophen (TYLENOL) 500 MG tablet Take 2 tablets (1,000 mg total) by mouth every 6 (six) hours as needed. (Patient taking differently: Take 1,000 mg by mouth every 6 (six) hours as needed for moderate pain.)  . amLODipine (NORVASC) 2.5 MG tablet Take 2.5 mg by mouth daily.  [name] aspirin 81 MG chewable tablet Chew 1 tablet (81 mg total) by mouth in the morning and at bedtime.  . baclofen (LIORESAL) 10 MG tablet Take 1 tablet (10 mg total) by mouth daily as needed for up to 10 days for muscle spasms.  . cholecalciferol (VITAMIN D) 1000 units tablet Take 1 tablet (1,000 Units total) by mouth daily.  . Emollient (CERAVE) CREA Apply 1 application topically daily. Mix cream with Triamcinolone  . furosemide (LASIX) 40 MG tablet Take 40 mg by mouth daily.  . hydrocortisone 2.5 % lotion Apply 1 application topically daily as needed (psoriasis).  [name] ketoconazole (NIZORAL) 2 % cream Apply 1 application topically as needed (for psoriasis).   [name] lamoTRIgine (LAMICTAL) 150 MG tablet TAKE 1 TABLET BY MOUTH TWICE DAILY. (Patient taking differently: Take 150 mg by mouth in the morning and at bedtime.)  . levETIRAcetam (KEPPRA) 250 MG tablet Take 250 mg by mouth daily. In the morning  . levETIRAcetam (KEPPRA) 500 MG tablet Take 500 mg by mouth at bedtime.  . memantine (NAMENDA) 10 MG tablet TAKE 1 TABLET BY MOUTH TWICE DAILY. (Patient taking differently: Take 10 mg by mouth in the morning and at bedtime.)  . nystatin  (MYCOSTATIN/NYSTOP) powder Apply 1 application topically 2 (two) times daily as needed (tinea cruris).  . ondansetron (ZOFRAN) 4 MG tablet Take 1 tablet (4 mg total) by mouth daily as needed for nausea or vomiting.  [name] oxyCODONE (OXY IR/ROXICODONE) 5 MG immediate release tablet Take 5 mg by mouth every 6 (six) hours as needed for severe pain.  . polyethylene glycol (MIRALAX / GLYCOLAX) 17 g packet Take 17 g by mouth daily.  . Potassium Chloride ER 20 MEQ TBCR Take 40 mEq by mouth daily.  . pramipexole (MIRAPEX) 0.25 MG tablet Take 0.5 mg by mouth at bedtime.  . sennosides-docusate sodium (SENOKOT-S) 8.6-50 MG tablet Take 2 tablets by mouth at bedtime.  . simvastatin (ZOCOR) 10 MG tablet Take 10 mg by mouth at bedtime.  [name] tiotropium (SPIRIVA HANDIHALER) 18 MCG inhalation capsule INHALE CONTENTS OF ONE CAPSULE ONCE DAILY FOR COPD. (Patient taking differently: Place 18 mcg into inhaler and inhale daily.)  . triamcinolone (KENALOG) 0.1 % Apply 1 application topically daily. Mix with cerave  . [DISCONTINUED]  HYDROcodone-acetaminophen (NORCO) 10-325 MG tablet Take 1 tablet by mouth every 6 (six) hours as needed for severe pain.  . [DISCONTINUED] omeprazole (PRILOSEC) 20 MG capsule Take 20 mg by mouth daily.   No facility-administered encounter medications on file as of 06/01/2020.    Review of Systems:  Review of Systems  Constitutional: Positive for activity change and appetite change.  HENT: Negative.   Respiratory: Negative.   Cardiovascular: Negative.   Gastrointestinal: Negative.   Genitourinary: Negative.   Musculoskeletal: Positive for arthralgias, gait problem and myalgias.  Skin: Negative.   Psychiatric/Behavioral: Negative.     Health Maintenance  Topic Date Due  . COVID-19 Vaccine (4 - Booster for Moderna series) 08/14/2020  . TETANUS/TDAP  08/16/2025  . INFLUENZA VACCINE  Completed  . DEXA SCAN  Completed  . PNA vac Low Risk Adult  Completed  . MAMMOGRAM  Discontinued     Physical Exam: Vitals:   06/02/20 1424  BP: 110/64  Pulse: 78  Resp: 20  Temp: (!) 97.1 F (36.2 C)   There is no height or weight on file to calculate BMI. Physical Exam Vitals reviewed.  Constitutional:      Appearance: Normal appearance.  HENT:     Head: Normocephalic.     Nose: Nose normal.     Mouth/Throat:     Mouth: Mucous membranes are moist.     Pharynx: Oropharynx is clear.  Eyes:     Pupils: Pupils are equal, round, and reactive to light.  Cardiovascular:     Rate and Rhythm: Normal rate and regular rhythm.     Pulses: Normal pulses.  Pulmonary:     Effort: Pulmonary effort is normal.     Breath sounds: Normal breath sounds.  Abdominal:     General: Abdomen is flat. Bowel sounds are normal.     Palpations: Abdomen is soft.  Musculoskeletal:     Cervical back: Neck supple.     Comments: Chronic Venous changes Left LE is in Immobilizer  Skin:    General: Skin is warm.  Neurological:     General: No focal deficit present.     Mental Status: She is alert and oriented to person, place, and time.  Psychiatric:        Mood and Affect: Mood normal.        Thought Content: Thought content normal.     Labs reviewed: Basic Metabolic Panel: Recent Labs    06/29/19 0000 10/27/19 0000 03/15/20 0000 05/02/20 0630 05/26/20 0935  NA 141   < > 143 140 141  K 4.0   < > 4.4 3.5 3.9  CL 106   < > 106 104 104  CO2 27*   < > 25* 25 26  GLUCOSE  --   --   --  99 86  BUN 15   < > 35* 25* 27*  CREATININE 0.7   < > 0.9 0.97 0.94  CALCIUM 9.2   < > 9.6 9.2 9.5  TSH 1.79  --   --   --   --    < > = values in this interval not displayed.   Liver Function Tests: Recent Labs    06/29/19 0000 01/11/20 0000  AST 14 16  ALT 7 11  ALKPHOS 129* 109  ALBUMIN 4.0 4.5   No results for input(s): LIPASE, AMYLASE in the last 8760 hours. No results for input(s): AMMONIA in the last 8760 hours. CBC: Recent Labs    01/11/20 0000 05/02/20  0630 05/26/20 0935  WBC  5.1 6.3 5.7  NEUTROABS 2,953.00  --   --   HGB 14.5 13.6 14.7  HCT 44 42.6 46.5*  MCV  --  89.7 91.7  PLT 249 223 253   Lipid Panel: Recent Labs    10/12/19 0000  CHOL 164  HDL 67  LDLCALC 82  TRIG 70   No results found for: HGBA1C  Procedures since last visit: No results found.  Assessment/Plan 1. s/p ORIF of Patella Will Repeat Xray in Facility Continue Oxycodone, Tylenol and Baclofen for Pain  2. Left knee pain, unspecified chronicity D/W therapy Different options of her movement by staff to reduce any Bending of that knee  3. Gastroesophageal reflux disease, unspecified whether esophagitis present D/c Prilosec and will use Protonix  4. Essential hypertension Stable on Norvasc and Lasix  5. Seizure disorder (HCC) Keppra and Lamictal  6. Restless legs syndrome (RLS) Continue Pramipexole  7. Edema, unspecified type Stable on Lasix  8. Senile dementia without behavioral disturbance (HCC) Highly Functional On Namenda  9. Generalized osteoarthritis of multiple sites Tylenol    Labs/tests ordered: CBC,CMP in 1 week

## 2020-06-02 ENCOUNTER — Encounter: Payer: Self-pay | Admitting: Internal Medicine

## 2020-06-07 ENCOUNTER — Non-Acute Institutional Stay (SKILLED_NURSING_FACILITY): Payer: Medicare PPO | Admitting: Nurse Practitioner

## 2020-06-07 ENCOUNTER — Encounter: Payer: Self-pay | Admitting: Nurse Practitioner

## 2020-06-07 DIAGNOSIS — M7021 Olecranon bursitis, right elbow: Secondary | ICD-10-CM | POA: Diagnosis not present

## 2020-06-07 DIAGNOSIS — M159 Polyosteoarthritis, unspecified: Secondary | ICD-10-CM

## 2020-06-07 DIAGNOSIS — G2581 Restless legs syndrome: Secondary | ICD-10-CM | POA: Diagnosis not present

## 2020-06-07 DIAGNOSIS — M810 Age-related osteoporosis without current pathological fracture: Secondary | ICD-10-CM

## 2020-06-07 DIAGNOSIS — F0390 Unspecified dementia without behavioral disturbance: Secondary | ICD-10-CM

## 2020-06-07 DIAGNOSIS — K5901 Slow transit constipation: Secondary | ICD-10-CM

## 2020-06-07 DIAGNOSIS — K219 Gastro-esophageal reflux disease without esophagitis: Secondary | ICD-10-CM

## 2020-06-07 DIAGNOSIS — S82002D Unspecified fracture of left patella, subsequent encounter for closed fracture with routine healing: Secondary | ICD-10-CM | POA: Diagnosis not present

## 2020-06-07 DIAGNOSIS — I10 Essential (primary) hypertension: Secondary | ICD-10-CM

## 2020-06-07 DIAGNOSIS — S82035K Nondisplaced transverse fracture of left patella, subsequent encounter for closed fracture with nonunion: Secondary | ICD-10-CM

## 2020-06-07 DIAGNOSIS — G40909 Epilepsy, unspecified, not intractable, without status epilepticus: Secondary | ICD-10-CM | POA: Diagnosis not present

## 2020-06-07 DIAGNOSIS — R609 Edema, unspecified: Secondary | ICD-10-CM

## 2020-06-07 DIAGNOSIS — J410 Simple chronic bronchitis: Secondary | ICD-10-CM

## 2020-06-07 NOTE — Assessment & Plan Note (Signed)
seizures, stable, on Keppra 500mg  qhs, 250mg  qd, Lamictal 150mg  bid.

## 2020-06-07 NOTE — Assessment & Plan Note (Signed)
takes Senokot, MiraLax.  

## 2020-06-07 NOTE — Assessment & Plan Note (Signed)
trace, decrease Furosemide,  Bun/creat 27/0.94 05/26/20, update BMP one week.

## 2020-06-07 NOTE — Assessment & Plan Note (Signed)
Blood pressure is controlled, continue Amlodipine.  

## 2020-06-07 NOTE — Assessment & Plan Note (Signed)
takes Spiriva qd, prn Albuterol HFA 

## 2020-06-07 NOTE — Assessment & Plan Note (Signed)
in general, on Tylenol 650mg  qd.

## 2020-06-07 NOTE — Assessment & Plan Note (Signed)
ST eval significant reflux for several weeks results of mucus production and significant limiting intake, no observed. oropharyngeal dysphagia. Takes  Omeprazole. Hgb 14.7 05/26/20

## 2020-06-07 NOTE — Assessment & Plan Note (Signed)
01/19/20 Ortho limit use LUE as tolerated. R elbow olecranon bursitis, uses pad prn

## 2020-06-07 NOTE — Assessment & Plan Note (Signed)
on Memantine 10mg bid. 

## 2020-06-07 NOTE — Assessment & Plan Note (Signed)
,   takes MiraPex 0.5mg  hs

## 2020-06-07 NOTE — Progress Notes (Signed)
Location:   SNF [place] Room Number: 64 Place of Service:  SNF (31) Provider: [facility] Suzetta Timko NP  [name], MD  Patient Care Team: [name], MD as PCP - General (Internal Medicine) [name], MD as Consulting Physician (Ophthalmology) [name], MD as Consulting Physician (Cardiology) [name], MD as Consulting Physician (Dermatology) [name], MD as Consulting Physician (Orthopedic Surgery) [name], MD as Consulting Physician (Orthopedic Surgery) [name], MD as Consulting Physician (Orthopedic Surgery) [place], [place] Johntae Broxterman X, NP as Nurse Practitioner (Nurse Practitioner) [name], MD as Consulting Physician (Neurology)  Extended Emergency Contact Information Primary Emergency Contact: [name],[name] Address: [address]          [place], [place] [address] [name] of [place] Phone: [phone] Relation: Sister Secondary Emergency Contact: [name] [place] of [place] Phone: [phone] Mobile Phone: [phone] Relation: Sister  Code Status: DNR Goals of care: Advanced Directive information Advanced Directives 05/26/2020  Does Patient Have a Medical Advance Directive? Yes  Type of Paramedic of [place];Living will  Does patient want to make changes to medical advance directive? -  Copy of [place] in Chart? -  Pre-existing out of facility DNR order (yellow form or pink MOST form) -     Chief Complaint  Patient presents with  . Acute Visit    The patient requesting to reduce Furosemide.     HPI:  Pt is a 87 y.o. female seen today for an acute visit for the patient's requesting to reduce Furosemide to decrease bathroom trips.    Fx of the left patella from a mechanical fall in the bathroom at [facility] 04/26/20. ED eval revealed closed nondisplaced transverse fracture left patella, f/u Ortho s/p  ORIF.  prn  Oxycodone for pain. None weight bearing.              GERD ST eval significant reflux for several weeks results of mucus production and significant limiting intake, no observed. oropharyngeal dysphagia. Takes  Omeprazole. Hgb 14.7 05/26/20             Restless leg symptom, takes MiraPex 0.5mg  hs Hx of dementia,  on Memantine 10mg  bid.  Hx of seizures, stable, on Keppra 500mg  qhs, 250mg  qd, Lamictal 150mg  bid.  Edema, trace, on Furosemide,  Bun/creat 27/0.94 05/26/20 OA in general, on Tylenol 650mg  qd. Olecranon bursitis,01/19/20 Ortho limit use LUE as tolerated. R elbow olecranon bursitis, uses pad prn HTN, takes Amlodipine.  COPD, takes Spiriva qd, prn Albuterol HFA Constipation, takes Senokot, MiraLax.  OP, stopped Foxamax per her request.     Past Medical History:  Diagnosis Date  . Acute bronchitis 05/23/2011  . Acute upper respiratory infections of unspecified site 05/23/2011  . Arthritis   . Chronic airway obstruction, not elsewhere classified 05/23/2011  . Disturbance of salivary secretion 01/31/2011  . Dizziness and giddiness 01/31/2011  . Dyspnea   . Essential tremor 04/25/2014  . External hemorrhoids without mention of complication 22/05/5425  . Gait disorder 04/25/2014  . Insomnia, unspecified 09/12/2011  . Lumbago 01/31/2011  . Major depressive disorder, single episode, unspecified 01/31/2011  . Memory disorder 04/25/2014  . Mitral valve disorders(424.0) 01/31/2011  . Other and unspecified hyperlipidemia 01/31/2011  . Other convulsions 01/31/2011  . Other emphysema ([place]) 01/31/2011  . Pain in joint, site unspecified 01/31/2011  . Restless legs syndrome (RLS) 09/12/2011  .  Retinal detachment with retinal defect of right eye 2011   right eye twice  . Seizure disorder ([place])   . Senile osteoporosis 01/31/2011  . Spontaneous ecchymoses 01/31/2011  . Stiffness of joints, not  elsewhere classified, multiple sites 01/31/2011  . Unspecified essential hypertension 01/31/2011   Past Surgical History:  Procedure Laterality Date  . ABDOMINAL HYSTERECTOMY  06/21/2003   TAH/BSO, omenectomy PSB resect, Stg IC cystadenofibroma  . CHOLECYSTECTOMY  2005   Dr. Marlou Starks  . ELBOW SURGERY Right 2008   broken   Dr. Apolonio Schneiders  . EYE SURGERY    . ORIF PATELLA Left 05/02/2020   Procedure: OPEN REDUCTION INTERNAL (ORIF) FIXATION LEFT PATELLA WITH MEDIAL AND LATERAL LIGAMENT REINFORCEMENTS;  Surgeon: [name], MD;  Location: [facility] ORS;  Service: Orthopedics;  Laterality: Left;  . ORIF PATELLA Left 05/30/2020   Procedure: OPEN REDUCTION INTERNAL (ORIF) FIXATION PATELLA;  Surgeon: [name], MD;  Location: [facility] ORS;  Service: Orthopedics;  Laterality: Left;  . RETINAL DETACHMENT SURGERY N/A    two  . REVERSE SHOULDER ARTHROPLASTY Left 05/06/2019   Procedure: REVERSE SHOULDER ARTHROPLASTY;  Surgeon: Justice Britain, MD;  Location: [facility] ORS;  Service: Orthopedics;  Laterality: Left;  178min  . ROTATOR CUFF REPAIR Right 2012   Dr. Theda Sers  . SQUAMOUS CELL CARCINOMA EXCISION Bilateral 2012, 8/14   [place] on legs   Dr. Sarajane Jews  . TONSILLECTOMY  1941  . VIDEO BRONCHOSCOPY WITH ENDOBRONCHIAL NAVIGATION N/A 11/29/2015   Procedure: VIDEO BRONCHOSCOPY WITH ENDOBRONCHIAL NAVIGATION;  Surgeon: Collene Gobble, MD;  Location: [facility] OR;  Service: Thoracic;  Laterality: N/A;    Allergies  Allergen Reactions  . Dyazide [Hydrochlorothiazide W-Triamterene]     Other reaction(s): her blood pressure too much  . Latex Other (See Comments)    Swelling  . Other     Other reaction(s): local red reaction   . Rofecoxib     Other reaction(s): shortness of breath  . Sulfa Antibiotics Nausea And Vomiting  . Tetracycline Hcl     Other reaction(s): Cant take due to a drug interation    Allergies as of 06/07/2020      Reactions   Dyazide [hydrochlorothiazide W-triamterene]    Other reaction(s): her blood  pressure too much   Latex Other (See Comments)   Swelling   Other    Other reaction(s): local red reaction   Rofecoxib    Other reaction(s): shortness of breath   Sulfa Antibiotics Nausea And Vomiting   Tetracycline Hcl    Other reaction(s): Cant take due to a drug interation      Medication List       Accurate as of June 07, 2020  3:19 PM. If you have any questions, ask your nurse or doctor.        acetaminophen 500 MG tablet Commonly known as: TYLENOL Take 2 tablets (1,000 mg total) by mouth every 6 (six) hours as needed. What changed: reasons to take this   amLODipine 2.5 MG tablet Commonly known as: NORVASC Take 2.5 mg by mouth daily.   aspirin 81 MG chewable tablet Chew 1 tablet (81 mg total) by mouth in the morning and at bedtime.   baclofen 10 MG tablet Commonly known as: LIORESAL Take 1 tablet (10 mg total) by mouth daily as needed for up to 10 days for muscle spasms.   CeraVe Crea Apply 1 application topically daily. Mix cream with Triamcinolone   cholecalciferol 1000 units tablet Commonly known as: VITAMIN D Take 1  tablet (1,000 Units total) by mouth daily.   furosemide 40 MG tablet Commonly known as: LASIX Take 40 mg by mouth daily.   hydrocortisone 2.5 % lotion Apply 1 application topically daily as needed (psoriasis).   ketoconazole 2 % cream Commonly known as: NIZORAL Apply 1 application topically as needed (for psoriasis).   lamoTRIgine 150 MG tablet Commonly known as: LAMICTAL TAKE 1 TABLET BY MOUTH TWICE DAILY. What changed: when to take this   levETIRAcetam 500 MG tablet Commonly known as: KEPPRA Take 500 mg by mouth at bedtime.   levETIRAcetam 250 MG tablet Commonly known as: KEPPRA Take 250 mg by mouth daily. In the morning   memantine 10 MG tablet Commonly known as: NAMENDA TAKE 1 TABLET BY MOUTH TWICE DAILY. What changed: when to take this   nystatin powder Commonly known as: MYCOSTATIN/NYSTOP Apply 1 application topically  2 (two) times daily as needed (tinea cruris).   ondansetron 4 MG tablet Commonly known as: Zofran Take 1 tablet (4 mg total) by mouth daily as needed for nausea or vomiting.   oxyCODONE 5 MG immediate release tablet Commonly known as: Oxy IR/ROXICODONE Take 5 mg by mouth every 6 (six) hours as needed for severe pain.   pantoprazole 40 MG tablet Commonly known as: PROTONIX Take 40 mg by mouth daily.   polyethylene glycol 17 g packet Commonly known as: MIRALAX / GLYCOLAX Take 17 g by mouth daily.   Potassium Chloride ER 20 MEQ Tbcr Take 40 mEq by mouth daily.   pramipexole 0.25 MG tablet Commonly known as: MIRAPEX Take 0.5 mg by mouth at bedtime.   sennosides-docusate sodium 8.6-50 MG tablet Commonly known as: SENOKOT-S Take 2 tablets by mouth at bedtime.   simvastatin 10 MG tablet Commonly known as: ZOCOR Take 10 mg by mouth at bedtime.   tiotropium 18 MCG inhalation capsule Commonly known as: Spiriva HandiHaler INHALE CONTENTS OF ONE CAPSULE ONCE DAILY FOR COPD. What changed:   how much to take  how to take this  when to take this  additional instructions   triamcinolone 0.1 % Commonly known as: KENALOG Apply 1 application topically daily. Mix with cerave       Review of Systems  Constitutional: Negative for activity change, appetite change and fever.  HENT: Positive for hearing loss. Negative for congestion and voice change.   Eyes: Negative for visual disturbance.  Respiratory: Positive for shortness of breath. Negative for cough, chest tightness and wheezing.        DOE is chronic  Cardiovascular: Positive for leg swelling. Negative for chest pain and palpitations.  Gastrointestinal: Negative for abdominal pain and constipation.       Acid reflux symptoms.   Genitourinary: Negative for difficulty urinating, dysuria and urgency.  Musculoskeletal: Positive for arthralgias, gait problem and joint swelling.       ORIF of the left patella.   Skin:  Negative for color change.       BLE discoloration  Neurological: Positive for seizures. Negative for dizziness, tremors, speech difficulty and headaches.       Memory lapses. Hx of seizures. RLS  Psychiatric/Behavioral: Negative for confusion and sleep disturbance. The patient is not nervous/anxious.     Immunization History  Administered Date(s) Administered  . Influenza Split 01/06/2014, 01/15/2017, 01/08/2018, 12/09/2018  . Influenza Whole 01/07/2012, 01/06/2013  . Influenza, High Dose Seasonal PF 12/25/2015, 01/20/2017  . Influenza,inj,Quad PF,6+ Mos 12/21/2014  . Influenza-Unspecified 12/09/2018, 01/18/2020  . Moderna Sars-Covid-2 Vaccination 04/12/2019, 06/05/2019, 02/15/2020  .  Pneumococcal Conjugate-13 02/01/2014  . Pneumococcal Polysaccharide-23 12/20/1992, 01/15/2000, 04/08/2004, 06/08/2004  . Td 04/08/2002, 04/22/2002  . Tdap 04/09/2011, 08/17/2015  . Zoster 04/08/2008, 05/29/2014, 05/28/2017  . Zoster Recombinat (Shingrix) 08/27/2017   Pertinent  Health Maintenance Due  Topic Date Due  . INFLUENZA VACCINE  Completed  . DEXA SCAN  Completed  . PNA vac Low Risk Adult  Completed  . MAMMOGRAM  Discontinued   Fall Risk  03/24/2014 10/15/2012  Falls in the past year? No No   Functional Status Survey:    Vitals:   06/07/20 1513  BP: 126/80  Pulse: 86  Resp: 18  Temp: (!) 97.3 F (36.3 C)  SpO2: 92%   There is no height or weight on file to calculate BMI. Physical Exam Vitals and nursing note reviewed.  Constitutional:      Appearance: Normal appearance.  HENT:     Head: Normocephalic and atraumatic.     Mouth/Throat:     Mouth: Mucous membranes are moist.  Eyes:     Extraocular Movements: Extraocular movements intact.     Conjunctiva/sclera:     Right eye: Right conjunctiva is not injected.     Left eye: Left conjunctiva is not injected.     Pupils: Pupils are equal, round, and reactive to light.  Cardiovascular:     Rate and Rhythm: Normal rate and  regular rhythm.     Heart sounds: Murmur heard.      Comments: PD pulses are not felt.  Pulmonary:     Breath sounds: Rales present.     Comments: Decreased air entry to both lungs. Bibasilar rales.  Abdominal:     General: Bowel sounds are normal.     Palpations: Abdomen is soft.     Tenderness: There is no abdominal tenderness.     Comments: Mid abd surgical scar  Musculoskeletal:        General: Swelling and tenderness present.     Cervical back: Normal range of motion and neck supple.     Right lower leg: Edema present.     Left lower leg: Edema present.     Comments:  Decreased overhead ROM of the left shoulder. Right elbow olecranon bursa effusion about a ping pong ball size. BLE edema trace  is chronic. Left knee s/p ORIF of the patella in knee support/brace  Skin:    General: Skin is warm and dry.     Comments: Brownish skin discoloration BLE  Neurological:     General: No focal deficit present.     Mental Status: She is alert and oriented to person, place, and time. Mental status is at baseline.     Motor: No weakness.     Coordination: Coordination normal.     Gait: Gait abnormal.  Psychiatric:        Mood and Affect: Mood normal.        Behavior: Behavior normal.        Thought Content: Thought content normal.        Judgment: Judgment normal.     Labs reviewed: Recent Labs    03/15/20 0000 05/02/20 0630 05/26/20 0935  NA 143 140 141  K 4.4 3.5 3.9  CL 106 104 104  CO2 25* 25 26  GLUCOSE  --  99 86  BUN 35* 25* 27*  CREATININE 0.9 0.97 0.94  CALCIUM 9.6 9.2 9.5   Recent Labs    06/29/19 0000 01/11/20 0000  AST 14 16  ALT 7 11  ALKPHOS 129* 109  ALBUMIN 4.0 4.5   Recent Labs    01/11/20 0000 05/02/20 0630 05/26/20 0935  WBC 5.1 6.3 5.7  NEUTROABS 2,953.00  --   --   HGB 14.5 13.6 14.7  HCT 44 42.6 46.5*  MCV  --  89.7 91.7  PLT 249 223 253   Lab Results  Component Value Date   TSH 1.79 06/29/2019   No results found for: HGBA1C Lab  Results  Component Value Date   CHOL 164 10/12/2019   HDL 67 10/12/2019   LDLCALC 82 10/12/2019   TRIG 70 10/12/2019   CHOLHDL 3.6 06/27/2015    Significant Diagnostic Results in last 30 days:  No results found.  Assessment/Plan: Edema trace, decrease Furosemide,  Bun/creat 27/0.94 05/26/20, update BMP one week.    Generalized osteoarthritis of multiple sites in general, on Tylenol 650mg  qd.   Nondisplaced transverse fracture of left patella, subsequent encounter for closed fracture with nonunion Fx of the left patella from a mechanical fall in the bathroom at [facility] 04/26/20. ED eval revealed closed nondisplaced transverse fracture left patella, f/u Ortho, s/p ORIF will progress to hard cast in 2 weeks, none weight bearing.  prn Oxycodone for pain.   GERD (gastroesophageal reflux disease) ST eval significant reflux for several weeks results of mucus production and significant limiting intake, no observed. oropharyngeal dysphagia. Takes  Omeprazole. Hgb 14.7 05/26/20   Restless legs syndrome (RLS) , takes MiraPex 0.5mg  hs   Senile dementia (HCC) on Memantine 10mg  bid.    Seizure disorder ([place]) seizures, stable, on Keppra 500mg  qhs, 250mg  qd, Lamictal 150mg  bid.   Olecranon bursitis of right elbow 01/19/20 Ortho limit use LUE as tolerated. R elbow olecranon bursitis, uses pad prn   Essential hypertension Blood pressure is controlled, continue Amlodipine.   COPD (chronic obstructive pulmonary disease) (HCC) takes Spiriva qd, prn Albuterol HFA   Slow transit constipation takes Senokot, MiraLax.  Senile osteoporosis  stopped Foxamax per her request.     Family/ staff Communication: plan of care reviewed with the patient and charge nurse.   Labs/tests ordered: BMP  Time spend 35 minutes.

## 2020-06-07 NOTE — Assessment & Plan Note (Signed)
stopped Foxamax per her request.

## 2020-06-07 NOTE — Assessment & Plan Note (Addendum)
Fx of the left patella from a mechanical fall in the bathroom at [facility] 04/26/20. ED eval revealed closed nondisplaced transverse fracture left patella, f/u Ortho, s/p ORIF will progress to hard cast in 2 weeks, none weight bearing.  prn Oxycodone for pain.

## 2020-06-08 LAB — CBC AND DIFFERENTIAL
HCT: 43 (ref 36–46)
Hemoglobin: 14.2 (ref 12.0–16.0)
Neutrophils Absolute: 3515
Platelets: 247 (ref 150–399)
WBC: 5.5

## 2020-06-08 LAB — BASIC METABOLIC PANEL
BUN: 24 — AB (ref 4–21)
CO2: 31 — AB (ref 13–22)
Chloride: 105 (ref 99–108)
Creatinine: 1 (ref 0.5–1.1)
Glucose: 85
Potassium: 4.1 (ref 3.4–5.3)
Sodium: 143 (ref 137–147)

## 2020-06-08 LAB — HEPATIC FUNCTION PANEL
ALT: 9 (ref 7–35)
AST: 16 (ref 13–35)
Alkaline Phosphatase: 138 — AB (ref 25–125)
Bilirubin, Total: 0.6

## 2020-06-08 LAB — COMPREHENSIVE METABOLIC PANEL
Albumin: 3.9 (ref 3.5–5.0)
Calcium: 9.4 (ref 8.7–10.7)
Globulin: 2.5

## 2020-06-08 LAB — CBC: RBC: 4.88 (ref 3.87–5.11)

## 2020-06-13 LAB — BASIC METABOLIC PANEL
BUN: 22 — AB (ref 4–21)
CO2: 24 — AB (ref 13–22)
Chloride: 108 (ref 99–108)
Creatinine: 0.9 (ref 0.5–1.1)
Glucose: 79
Potassium: 3.9 (ref 3.4–5.3)
Sodium: 142 (ref 137–147)

## 2020-06-13 LAB — COMPREHENSIVE METABOLIC PANEL: Calcium: 9.6 (ref 8.7–10.7)

## 2020-06-14 DIAGNOSIS — S82002D Unspecified fracture of left patella, subsequent encounter for closed fracture with routine healing: Secondary | ICD-10-CM | POA: Diagnosis not present

## 2020-06-15 ENCOUNTER — Non-Acute Institutional Stay (SKILLED_NURSING_FACILITY): Payer: Medicare PPO | Admitting: Nurse Practitioner

## 2020-06-15 ENCOUNTER — Encounter: Payer: Self-pay | Admitting: Nurse Practitioner

## 2020-06-15 DIAGNOSIS — M810 Age-related osteoporosis without current pathological fracture: Secondary | ICD-10-CM

## 2020-06-15 DIAGNOSIS — G40909 Epilepsy, unspecified, not intractable, without status epilepticus: Secondary | ICD-10-CM | POA: Diagnosis not present

## 2020-06-15 DIAGNOSIS — G2581 Restless legs syndrome: Secondary | ICD-10-CM

## 2020-06-15 DIAGNOSIS — K5901 Slow transit constipation: Secondary | ICD-10-CM | POA: Diagnosis not present

## 2020-06-15 DIAGNOSIS — F0390 Unspecified dementia without behavioral disturbance: Secondary | ICD-10-CM

## 2020-06-15 DIAGNOSIS — J431 Panlobular emphysema: Secondary | ICD-10-CM | POA: Diagnosis not present

## 2020-06-15 DIAGNOSIS — I10 Essential (primary) hypertension: Secondary | ICD-10-CM

## 2020-06-15 DIAGNOSIS — M159 Polyosteoarthritis, unspecified: Secondary | ICD-10-CM

## 2020-06-15 DIAGNOSIS — K219 Gastro-esophageal reflux disease without esophagitis: Secondary | ICD-10-CM

## 2020-06-15 DIAGNOSIS — R609 Edema, unspecified: Secondary | ICD-10-CM

## 2020-06-15 DIAGNOSIS — M7021 Olecranon bursitis, right elbow: Secondary | ICD-10-CM

## 2020-06-15 DIAGNOSIS — S82035K Nondisplaced transverse fracture of left patella, subsequent encounter for closed fracture with nonunion: Secondary | ICD-10-CM

## 2020-06-15 NOTE — Progress Notes (Signed)
Location:   [facility] Room Number: 29 Place of Service:  SNF (31) Provider:  Eknoor Novack, Lennie Odor NP  [name], MD  Patient Care Team: [name], MD as PCP - General (Internal Medicine) [name], MD as Consulting Physician (Ophthalmology) [name], MD as Consulting Physician (Cardiology) [name], MD as Consulting Physician (Dermatology) [name], MD as Consulting Physician (Orthopedic Surgery) [name], MD as Consulting Physician (Orthopedic Surgery) [name], MD as Consulting Physician (Orthopedic Surgery) Schertz, Hardy, Trayvond Viets X, NP as Nurse Practitioner (Nurse Practitioner) [name], MD as Consulting Physician (Neurology)  Extended Emergency Contact Information Primary Emergency Contact: [name],[name] Address: [address]          [place], [place] [address] [name] of [place] Phone: [phone] Relation: Sister Secondary Emergency Contact: [name] [place] of [place] Phone: [phone] Mobile Phone: [phone] Relation: Sister  Code Status:  DNR Managed Care Goals of care: Advanced Directive information Advanced Directives 05/26/2020  Does Patient Have a Medical Advance Directive? Yes  Type of Paramedic of [place];Living will  Does patient want to make changes to medical advance directive? -  Copy of [place] in Chart? -  Pre-existing out of facility DNR order (yellow form or pink MOST form) -     Chief Complaint  Patient presents with  . Medical Management of Chronic Issues    HPI:  Pt is a 87 y.o. female seen today for medical management of chronic diseases.    Fx of the left patella from a mechanical fall in the bathroom at [facility] 04/26/20. ED eval revealed closed nondisplaced transverse fracture left patella, f/u Ortho s/p  ORIF.  prn Oxycodone for pain. None weight bearing.   GERD ST eval significant reflux for several weeks results of mucus production, no observed. oropharyngeal dysphagia. Takes  Omeprazole. Hgb 14.2 06/08/20, improved.  Restless leg symptom, takes MiraPex 0.5mg  hs Hx of dementia,on Memantine 10mg  bid.  Hx of seizures, stable, on Keppra 500mg  qhs, 250mg  qd, Lamictal 150mg  bid.  Edema, trace, on Furosemide,  Bun/creat 22/0.88 06/13/20 OA in general, on Tylenol 650mg  qd. Olecranon bursitis,01/19/20 Ortho limit use LUE as tolerated. R elbow olecranon bursitis, uses pad prn HTN, takes Amlodipine.  COPD, takes Spiriva qd, prn Albuterol HFA Constipation, takes Senokot, MiraLax.             OP, stopped Foxamax per her request.    Past Medical History:  Diagnosis Date  . Acute bronchitis 05/23/2011  . Acute upper respiratory infections of unspecified site 05/23/2011  . Arthritis   . Chronic airway obstruction, not elsewhere classified 05/23/2011  . Disturbance of salivary secretion 01/31/2011  . Dizziness and giddiness 01/31/2011  . Dyspnea   . Essential tremor 04/25/2014  . External hemorrhoids without mention of complication 53/29/9242  . Gait disorder 04/25/2014  . Insomnia, unspecified 09/12/2011  . Lumbago 01/31/2011  . Major depressive disorder, single episode, unspecified 01/31/2011  . Memory disorder 04/25/2014  . Mitral valve disorders(424.0) 01/31/2011  . Other and unspecified hyperlipidemia 01/31/2011  . Other convulsions 01/31/2011  . Other emphysema ([place]) 01/31/2011  . Pain in joint, site unspecified 01/31/2011  . Restless legs syndrome (RLS) 09/12/2011  . Retinal detachment with retinal defect of right eye 2011   right eye twice  . Seizure disorder ([place])   . Senile osteoporosis 01/31/2011  . Spontaneous ecchymoses  01/31/2011  . Stiffness of joints, not elsewhere classified, multiple sites 01/31/2011  .  Unspecified essential hypertension 01/31/2011   Past Surgical History:  Procedure Laterality Date  . ABDOMINAL HYSTERECTOMY  06/21/2003   TAH/BSO, omenectomy PSB resect, Stg IC cystadenofibroma  . CHOLECYSTECTOMY  2005   Dr. Marlou Starks  . ELBOW SURGERY Right 2008   broken   Dr. Apolonio Schneiders  . EYE SURGERY    . ORIF PATELLA Left 05/02/2020   Procedure: OPEN REDUCTION INTERNAL (ORIF) FIXATION LEFT PATELLA WITH MEDIAL AND LATERAL LIGAMENT REINFORCEMENTS;  Surgeon: [name], MD;  Location: [facility] ORS;  Service: Orthopedics;  Laterality: Left;  . ORIF PATELLA Left 05/30/2020   Procedure: OPEN REDUCTION INTERNAL (ORIF) FIXATION PATELLA;  Surgeon: [name], MD;  Location: [facility] ORS;  Service: Orthopedics;  Laterality: Left;  . RETINAL DETACHMENT SURGERY N/A    two  . REVERSE SHOULDER ARTHROPLASTY Left 05/06/2019   Procedure: REVERSE SHOULDER ARTHROPLASTY;  Surgeon: Justice Britain, MD;  Location: [facility] ORS;  Service: Orthopedics;  Laterality: Left;  146min  . ROTATOR CUFF REPAIR Right 2012   Dr. Theda Sers  . SQUAMOUS CELL CARCINOMA EXCISION Bilateral 2012, 8/14   [place] on legs   Dr. Sarajane Jews  . TONSILLECTOMY  1941  . VIDEO BRONCHOSCOPY WITH ENDOBRONCHIAL NAVIGATION N/A 11/29/2015   Procedure: VIDEO BRONCHOSCOPY WITH ENDOBRONCHIAL NAVIGATION;  Surgeon: Collene Gobble, MD;  Location: [facility] OR;  Service: Thoracic;  Laterality: N/A;    Allergies  Allergen Reactions  . Dyazide [Hydrochlorothiazide W-Triamterene]     Other reaction(s): her blood pressure too much  . Latex Other (See Comments)    Swelling  . Other     Other reaction(s): local red reaction   . Rofecoxib     Other reaction(s): shortness of breath  . Sulfa Antibiotics Nausea And Vomiting  . Tetracycline Hcl     Other reaction(s): Cant take due to a drug interation    Allergies as of 06/15/2020      Reactions   Dyazide [hydrochlorothiazide W-triamterene]    Other reaction(s): her blood pressure too much   Latex Other (See Comments)    Swelling   Other    Other reaction(s): local red reaction   Rofecoxib    Other reaction(s): shortness of breath   Sulfa Antibiotics Nausea And Vomiting   Tetracycline Hcl    Other reaction(s): Cant take due to a drug interation      Medication List       Accurate as of June 15, 2020 11:59 PM. If you have any questions, ask your nurse or doctor.        STOP taking these medications   ondansetron 4 MG tablet Commonly known as: Zofran Stopped by: Jenilyn Magana X Donyea Beverlin, NP     TAKE these medications   acetaminophen 500 MG tablet Commonly known as: TYLENOL Take 2 tablets (1,000 mg total) by mouth every 6 (six) hours as needed. What changed: reasons to take this   amLODipine 2.5 MG tablet Commonly known as: NORVASC Take 2.5 mg by mouth daily.   aspirin 81 MG chewable tablet Chew 81 mg by mouth daily. What changed: Another medication with the same name was removed. Continue taking this medication, and follow the directions you see here. Changed by: Kooper Godshall X Jevin Camino, NP   CeraVe Crea Apply 1 application topically daily. Mix cream with Triamcinolone   cholecalciferol 1000 units tablet Commonly known as: VITAMIN D Take 1 tablet (1,000 Units total) by mouth daily.   furosemide 20  MG tablet Commonly known as: LASIX Take 20 mg by mouth daily.   hydrocortisone 2.5 % lotion Apply 1 application topically daily as needed (psoriasis).   ketoconazole 2 % cream Commonly known as: NIZORAL Apply 1 application topically as needed (for psoriasis).   lamoTRIgine 150 MG tablet Commonly known as: LAMICTAL TAKE 1 TABLET BY MOUTH TWICE DAILY. What changed: when to take this   levETIRAcetam 500 MG tablet Commonly known as: KEPPRA Take 500 mg by mouth at bedtime.   levETIRAcetam 250 MG tablet Commonly known as: KEPPRA Take 250 mg by mouth daily. In the morning   memantine 10 MG tablet Commonly known as: NAMENDA TAKE 1 TABLET BY MOUTH TWICE DAILY.   nystatin powder Commonly known as:  MYCOSTATIN/NYSTOP Apply 1 application topically 2 (two) times daily as needed (tinea cruris).   oxyCODONE 5 MG immediate release tablet Commonly known as: Oxy IR/ROXICODONE Take 5 mg by mouth every 6 (six) hours as needed for severe pain.   pantoprazole 40 MG tablet Commonly known as: PROTONIX Take 40 mg by mouth daily.   polyethylene glycol 17 g packet Commonly known as: MIRALAX / GLYCOLAX Take 17 g by mouth daily.   Potassium Chloride ER 20 MEQ Tbcr Take 40 mEq by mouth daily.   pramipexole 0.25 MG tablet Commonly known as: MIRAPEX Take 0.5 mg by mouth at bedtime.   sennosides-docusate sodium 8.6-50 MG tablet Commonly known as: SENOKOT-S Take 2 tablets by mouth at bedtime.   simvastatin 10 MG tablet Commonly known as: ZOCOR Take 10 mg by mouth at bedtime.   tiotropium 18 MCG inhalation capsule Commonly known as: Spiriva HandiHaler INHALE CONTENTS OF ONE CAPSULE ONCE DAILY FOR COPD. What changed:   how much to take  how to take this  when to take this  additional instructions   triamcinolone 0.1 % Commonly known as: KENALOG Apply 1 application topically daily. Mix with cerave       Review of Systems  Constitutional: Negative for fatigue, fever and unexpected weight change.  HENT: Positive for hearing loss. Negative for congestion and voice change.   Eyes: Negative for visual disturbance.  Respiratory: Positive for shortness of breath. Negative for cough, chest tightness and wheezing.        DOE is chronic  Cardiovascular: Positive for leg swelling. Negative for chest pain and palpitations.  Gastrointestinal: Negative for abdominal pain and constipation.       Acid reflux symptoms.   Genitourinary: Negative for difficulty urinating, dysuria and urgency.  Musculoskeletal: Positive for arthralgias, gait problem and joint swelling.       ORIF of the left patella.   Skin: Negative for color change.       BLE discoloration  Neurological: Positive for  seizures. Negative for dizziness, tremors, speech difficulty and headaches.       Memory lapses. Hx of seizures. RLS  Psychiatric/Behavioral: Negative for confusion and sleep disturbance. The patient is not nervous/anxious.     Immunization History  Administered Date(s) Administered  . Influenza Split 01/06/2014, 01/15/2017, 01/08/2018, 12/09/2018  . Influenza Whole 01/07/2012, 01/06/2013  . Influenza, High Dose Seasonal PF 12/25/2015, 01/20/2017  . Influenza,inj,Quad PF,6+ Mos 12/21/2014  . Influenza-Unspecified 12/09/2018, 01/18/2020  . Moderna Sars-Covid-2 Vaccination 04/12/2019, 06/05/2019, 02/15/2020  . Pneumococcal Conjugate-13 02/01/2014  . Pneumococcal Polysaccharide-23 12/20/1992, 01/15/2000, 04/08/2004, 06/08/2004  . Td 04/08/2002, 04/22/2002  . Tdap 04/09/2011, 08/17/2015  . Zoster 04/08/2008, 05/29/2014, 05/28/2017  . Zoster Recombinat (Shingrix) 08/27/2017   Pertinent  Health Maintenance Due  Topic  Date Due  . INFLUENZA VACCINE  Completed  . DEXA SCAN  Completed  . PNA vac Low Risk Adult  Completed  . MAMMOGRAM  Discontinued   Fall Risk  03/24/2014 10/15/2012  Falls in the past year? No No   Functional Status Survey:    Vitals:   06/15/20 1141  BP: (!) 144/88  Pulse: 86  Resp: 20  Temp: 97.7 F (36.5 C)  SpO2: 94%  Weight: 161 lb 9.6 oz (73.3 kg)  Height: 5\' 2"  (1.575 m)   Body mass index is 29.56 kg/m. Physical Exam Vitals and nursing note reviewed.  Constitutional:      Appearance: Normal appearance.  HENT:     Head: Normocephalic and atraumatic.     Mouth/Throat:     Mouth: Mucous membranes are moist.  Eyes:     Extraocular Movements: Extraocular movements intact.     Conjunctiva/sclera:     Right eye: Right conjunctiva is not injected.     Left eye: Left conjunctiva is not injected.     Pupils: Pupils are equal, round, and reactive to light.  Cardiovascular:     Rate and Rhythm: Normal rate and regular rhythm.     Heart sounds: Murmur  heard.      Comments: PD pulses are not felt.  Pulmonary:     Breath sounds: Rales present.     Comments: Decreased air entry to both lungs. Bibasilar rales.  Abdominal:     General: Bowel sounds are normal.     Palpations: Abdomen is soft.     Tenderness: There is no abdominal tenderness.     Comments: Mid abd surgical scar  Musculoskeletal:        General: Swelling and tenderness present.     Cervical back: Normal range of motion and neck supple.     Right lower leg: Edema present.     Left lower leg: Edema present.     Comments:  Decreased overhead ROM of the left shoulder. Right elbow olecranon bursa effusion about a ping pong ball size. BLE edema trace  is chronic. Left knee s/p ORIF of the patella in knee is in a hard cast   Skin:    General: Skin is warm and dry.     Comments: Brownish skin discoloration BLE  Neurological:     General: No focal deficit present.     Mental Status: She is alert and oriented to person, place, and time. Mental status is at baseline.     Motor: No weakness.     Coordination: Coordination normal.     Gait: Gait abnormal.  Psychiatric:        Mood and Affect: Mood normal.        Behavior: Behavior normal.        Thought Content: Thought content normal.        Judgment: Judgment normal.     Labs reviewed: Recent Labs    05/02/20 0630 05/26/20 0935 06/08/20 0000  NA 140 141 143  K 3.5 3.9 4.1  CL 104 104 105  CO2 25 26 31*  GLUCOSE 99 86  --   BUN 25* 27* 24*  CREATININE 0.97 0.94 1.0  CALCIUM 9.2 9.5 9.4   Recent Labs    06/29/19 0000 01/11/20 0000 06/08/20 0000  AST 14 16 16   ALT 7 11 9   ALKPHOS 129* 109 138*  ALBUMIN 4.0 4.5 3.9   Recent Labs    01/11/20 0000 05/02/20 0630 05/26/20 0935 06/08/20 0000  WBC 5.1 6.3 5.7 5.5  NEUTROABS 2,953.00  --   --  3,515.00  HGB 14.5 13.6 14.7 14.2  HCT 44 42.6 46.5* 43  MCV  --  89.7 91.7  --   PLT 249 223 253 247   Lab Results  Component Value Date   TSH 1.79 06/29/2019    No results found for: HGBA1C Lab Results  Component Value Date   CHOL 164 10/12/2019   HDL 67 10/12/2019   LDLCALC 82 10/12/2019   TRIG 70 10/12/2019   CHOLHDL 3.6 06/27/2015    Significant Diagnostic Results in last 30 days:  No results found.  Assessment/Plan Seizure disorder (HCC) Hx of seizures, stable, on Keppra 500mg  qhs, 250mg  qd, Lamictal 150mg  bid.   Edema Edema BLE, trace, on Furosemide,  Bun/creat 22/0.88 06/13/20    Generalized osteoarthritis of multiple sites OA in general, on Tylenol 650mg  qd.  Olecranon bursitis of right elbow Olecranon bursitis,01/19/20 Ortho limit use LUE as tolerated. R elbow olecranon bursitis, uses pad prn   Essential hypertension Blood pressure is controlled, continue Amilodipine.   COPD (chronic obstructive pulmonary disease) (HCC) takes Spiriva qd, prn Albuterol HFA   Slow transit constipation takes Senokot, MiraLax.  Senile osteoporosis stopped Foxamax per her request.  Senile dementia ([place]) No behavioral issues, continue Memantine.   Restless legs syndrome (RLS) Restless leg symptom, takes MiraPex 0.5mg  hs   GERD (gastroesophageal reflux disease) GERD ST eval significant reflux for several weeks results of mucus production, no observed. oropharyngeal dysphagia. Takes  Omeprazole. Hgb 14.2 06/08/20, improved.    Nondisplaced transverse fracture of left patella, subsequent encounter for closed fracture with nonunion Fx of the left patella from a mechanical fall in the bathroom at [facility] 04/26/20. ED eval revealed closed nondisplaced transverse fracture left patella, f/u Ortho s/p  ORIF.  prn Oxycodone for pain. None weight bearing.       Family/ staff Communication: plan of care reviewed with the patient and charge nurse.   Labs/tests ordered:  none  Time spend 35 minutes.

## 2020-06-16 ENCOUNTER — Encounter: Payer: Self-pay | Admitting: Nurse Practitioner

## 2020-06-16 NOTE — Assessment & Plan Note (Signed)
GERD ST eval significant reflux for several weeks results of mucus production, no observed. oropharyngeal dysphagia. Takes  Omeprazole. Hgb 14.2 06/08/20, improved.

## 2020-06-16 NOTE — Assessment & Plan Note (Addendum)
Edema BLE, trace, on Furosemide,  Bun/creat 22/0.88 06/13/20

## 2020-06-16 NOTE — Assessment & Plan Note (Signed)
Fx of the left patella from a mechanical fall in the bathroom at [facility] 04/26/20. ED eval revealed closed nondisplaced transverse fracture left patella, f/u Ortho s/p  ORIF.  prn Oxycodone for pain. None weight bearing.

## 2020-06-16 NOTE — Assessment & Plan Note (Signed)
stopped Foxamax per her request.  

## 2020-06-16 NOTE — Assessment & Plan Note (Signed)
Restless leg symptom, takes MiraPex 0.5mg hs 

## 2020-06-16 NOTE — Assessment & Plan Note (Signed)
OA in general, on Tylenol 650mg qd.  

## 2020-06-16 NOTE — Assessment & Plan Note (Signed)
takes Spiriva qd, prn Albuterol HFA 

## 2020-06-16 NOTE — Assessment & Plan Note (Signed)
takes Senokot, MiraLax.  

## 2020-06-16 NOTE — Assessment & Plan Note (Signed)
Blood pressure is controlled, continue Amilodipine.

## 2020-06-16 NOTE — Assessment & Plan Note (Signed)
Olecranon bursitis,01/19/20 Ortho limit use LUE as tolerated. R elbow olecranon bursitis, uses pad prn 

## 2020-06-16 NOTE — Assessment & Plan Note (Signed)
Hx of seizures, stable, on Keppra 500mg  qhs, 250mg  qd, Lamictal 150mg  bid.

## 2020-06-16 NOTE — Assessment & Plan Note (Signed)
No behavioral issues, continue Memantine.

## 2020-07-03 ENCOUNTER — Non-Acute Institutional Stay (SKILLED_NURSING_FACILITY): Payer: Medicare PPO | Admitting: Nurse Practitioner

## 2020-07-03 DIAGNOSIS — M7021 Olecranon bursitis, right elbow: Secondary | ICD-10-CM

## 2020-07-03 DIAGNOSIS — S82035K Nondisplaced transverse fracture of left patella, subsequent encounter for closed fracture with nonunion: Secondary | ICD-10-CM | POA: Diagnosis not present

## 2020-07-03 DIAGNOSIS — G2581 Restless legs syndrome: Secondary | ICD-10-CM

## 2020-07-03 DIAGNOSIS — K5901 Slow transit constipation: Secondary | ICD-10-CM

## 2020-07-03 DIAGNOSIS — J431 Panlobular emphysema: Secondary | ICD-10-CM

## 2020-07-03 DIAGNOSIS — M159 Polyosteoarthritis, unspecified: Secondary | ICD-10-CM

## 2020-07-03 DIAGNOSIS — K219 Gastro-esophageal reflux disease without esophagitis: Secondary | ICD-10-CM | POA: Diagnosis not present

## 2020-07-03 DIAGNOSIS — L03316 Cellulitis of umbilicus: Secondary | ICD-10-CM

## 2020-07-03 DIAGNOSIS — F0390 Unspecified dementia without behavioral disturbance: Secondary | ICD-10-CM

## 2020-07-03 DIAGNOSIS — R609 Edema, unspecified: Secondary | ICD-10-CM

## 2020-07-03 DIAGNOSIS — G40909 Epilepsy, unspecified, not intractable, without status epilepticus: Secondary | ICD-10-CM

## 2020-07-03 DIAGNOSIS — I10 Essential (primary) hypertension: Secondary | ICD-10-CM

## 2020-07-03 DIAGNOSIS — M810 Age-related osteoporosis without current pathological fracture: Secondary | ICD-10-CM

## 2020-07-03 NOTE — Progress Notes (Signed)
Location:    [facility] Room Number: 29 Place of Service:  SNF (31) Provider: Lennie Odor Jaison Petraglia NP  [name], MD  Patient Care Team: [name], MD as PCP - General (Internal Medicine) [name], MD as Consulting Physician (Ophthalmology) [name], MD as Consulting Physician (Cardiology) [name], MD as Consulting Physician (Dermatology) [name], MD as Consulting Physician (Orthopedic Surgery) [name], MD as Consulting Physician (Orthopedic Surgery) [name], MD as Consulting Physician (Orthopedic Surgery) [place], [place] Ziasia Lenoir X, NP as Nurse Practitioner (Nurse Practitioner) [name], MD as Consulting Physician (Neurology)  Extended Emergency Contact Information Primary Emergency Contact: [name],[name] Address: [address]          [place], [place] [address] [name] of [place] Phone: [phone] Relation: Sister Secondary Emergency Contact: [name] [place] of [place] Phone: [phone] Mobile Phone: [phone] Relation: Sister  Code Status: DNR Goals of care: Advanced Directive information Advanced Directives 05/26/2020  Does Patient Have a Medical Advance Directive? Yes  Type of Paramedic of [place];Living will  Does patient want to make changes to medical advance directive? -  Copy of [place] in Chart? -  Pre-existing out of facility DNR order (yellow form or pink MOST form) -     Chief Complaint  Patient presents with  . Acute Visit    Infected umbilical area    HPI:  Pt is a 87 y.o. female seen today for an acute visit for infected umbilical area, didn't respond to antibiotic ointment, the patient desires oral antibiotic for treatment.   Fx of the left patella from a mechanical fall in the bathroom at [facility] 04/26/20. Resulted in a closed nondisplaced transverse fracture left  patella, f/u Orthos/pORIF. prn Oxycodone for pain.None weight bearing.in hard cast.  GERD ST eval significant reflux for several weeks results of mucus production, no observed.oropharyngeal dysphagia. TakesOmeprazole. Hgb 14.2 06/08/20, improved.  Restless leg symptom, takes MiraPex 0.5mg  hs Hx of dementia,on Memantine 10mg  bid.  Hx of seizures, stable, on Keppra 500mg  qhs, 250mg  qd, Lamictal 150mg  bid.  Edema, trace, on Furosemide,Bun/creat 22/0.88 06/13/20 OA in general, on Tylenol 650mg  qd. Olecranon bursitis,01/19/20 Ortho limit use LUE as tolerated. R elbow olecranon bursitis, uses pad prn HTN, takes Amlodipine.  COPD, takes Spiriva qd, prn Albuterol HFA Constipation, takes Senokot, MiraLax. OP, stopped Foxamax per her request.   Past Medical History:  Diagnosis Date  . Acute bronchitis 05/23/2011  . Acute upper respiratory infections of unspecified site 05/23/2011  . Arthritis   . Chronic airway obstruction, not elsewhere classified 05/23/2011  . Disturbance of salivary secretion 01/31/2011  . Dizziness and giddiness 01/31/2011  . Dyspnea   . Essential tremor 04/25/2014  . External hemorrhoids without mention of complication 09/32/3557  . Gait disorder 04/25/2014  . Insomnia, unspecified 09/12/2011  . Lumbago 01/31/2011  . Major depressive disorder, single episode, unspecified 01/31/2011  . Memory disorder 04/25/2014  . Mitral valve disorders(424.0) 01/31/2011  . Other and unspecified hyperlipidemia 01/31/2011  . Other convulsions 01/31/2011  . Other emphysema ([place]) 01/31/2011  . Pain in joint, site unspecified 01/31/2011  . Restless legs syndrome (RLS) 09/12/2011  . Retinal detachment with retinal defect of right eye 2011   right eye twice  . Seizure disorder ([place])   . Senile osteoporosis 01/31/2011  . Spontaneous ecchymoses 01/31/2011   . Stiffness of joints, not  elsewhere classified, multiple sites 01/31/2011  . Unspecified essential hypertension 01/31/2011   Past Surgical History:  Procedure Laterality Date  . ABDOMINAL HYSTERECTOMY  06/21/2003   TAH/BSO, omenectomy PSB resect, Stg IC cystadenofibroma  . CHOLECYSTECTOMY  2005   Dr. Marlou Starks  . ELBOW SURGERY Right 2008   broken   Dr. Apolonio Schneiders  . EYE SURGERY    . ORIF PATELLA Left 05/02/2020   Procedure: OPEN REDUCTION INTERNAL (ORIF) FIXATION LEFT PATELLA WITH MEDIAL AND LATERAL LIGAMENT REINFORCEMENTS;  Surgeon: [name], MD;  Location: [facility] ORS;  Service: Orthopedics;  Laterality: Left;  . ORIF PATELLA Left 05/30/2020   Procedure: OPEN REDUCTION INTERNAL (ORIF) FIXATION PATELLA;  Surgeon: [name], MD;  Location: [facility];  Service: Orthopedics;  Laterality: Left;  . RETINAL DETACHMENT SURGERY N/A    two  . REVERSE SHOULDER ARTHROPLASTY Left 05/06/2019   Procedure: REVERSE SHOULDER ARTHROPLASTY;  Surgeon: Justice Britain, MD;  Location: [facility];  Service: Orthopedics;  Laterality: Left;  179min  . ROTATOR CUFF REPAIR Right 2012   Dr. Theda Sers  . SQUAMOUS CELL CARCINOMA EXCISION Bilateral 2012, 8/14   [place] on legs   Dr. Sarajane Jews  . TONSILLECTOMY  1941  . VIDEO BRONCHOSCOPY WITH ENDOBRONCHIAL NAVIGATION N/A 11/29/2015   Procedure: VIDEO BRONCHOSCOPY WITH ENDOBRONCHIAL NAVIGATION;  Surgeon: Collene Gobble, MD;  Location: [facility];  Service: Thoracic;  Laterality: N/A;    Allergies  Allergen Reactions  . Dyazide [Hydrochlorothiazide W-Triamterene]     Other reaction(s): her blood pressure too much  . Latex Other (See Comments)    Swelling  . Other     Other reaction(s): local red reaction   . Rofecoxib     Other reaction(s): shortness of breath  . Sulfa Antibiotics Nausea And Vomiting  . Tetracycline Hcl     Other reaction(s): Cant take due to a drug interation    Allergies as of 07/03/2020      Reactions   Dyazide [hydrochlorothiazide W-triamterene]     Other reaction(s): her blood pressure too much   Latex Other (See Comments)   Swelling   Other    Other reaction(s): local red reaction   Rofecoxib    Other reaction(s): shortness of breath   Sulfa Antibiotics Nausea And Vomiting   Tetracycline Hcl    Other reaction(s): Cant take due to a drug interation      Medication List       Accurate as of July 03, 2020 11:59 PM. If you have any questions, ask your nurse or doctor.        acetaminophen 500 MG tablet Commonly known as: TYLENOL Take 2 tablets (1,000 mg total) by mouth every 6 (six) hours as needed. What changed: reasons to take this   amLODipine 2.5 MG tablet Commonly known as: NORVASC Take 2.5 mg by mouth daily.   aspirin 81 MG chewable tablet Chew 81 mg by mouth daily.   bacitracin 500 UNIT/GM ointment Apply 1 application topically 2 (two) times daily.   cephALEXin 500 MG capsule Commonly known as: KEFLEX Take 500 mg by mouth 3 (three) times daily.   CeraVe Crea Apply 1 application topically daily. Mix cream with Triamcinolone   cholecalciferol 1000 units tablet Commonly known as: VITAMIN D Take 1 tablet (1,000 Units total) by mouth daily.   furosemide 20 MG tablet Commonly known as: LASIX Take 20 mg by mouth daily.   hydrocortisone 2.5 % lotion Apply 1 application topically daily as needed (psoriasis).   ketoconazole 2 %  cream Commonly known as: NIZORAL Apply 1 application topically as needed (for psoriasis).   lamoTRIgine 150 MG tablet Commonly known as: LAMICTAL TAKE 1 TABLET BY MOUTH TWICE DAILY. What changed: when to take this   levETIRAcetam 500 MG tablet Commonly known as: KEPPRA Take 500 mg by mouth at bedtime.   levETIRAcetam 250 MG tablet Commonly known as: KEPPRA Take 250 mg by mouth daily. In the morning   memantine 10 MG tablet Commonly known as: NAMENDA TAKE 1 TABLET BY MOUTH TWICE DAILY.   nystatin powder Commonly known as: MYCOSTATIN/NYSTOP Apply 1 application topically  2 (two) times daily as needed (tinea cruris).   oxyCODONE 5 MG immediate release tablet Commonly known as: Oxy IR/ROXICODONE Take 5 mg by mouth every 6 (six) hours as needed for severe pain.   pantoprazole 40 MG tablet Commonly known as: PROTONIX Take 40 mg by mouth daily.   polyethylene glycol 17 g packet Commonly known as: MIRALAX / GLYCOLAX Take 17 g by mouth daily.   Potassium Chloride ER 20 MEQ Tbcr Take 20 mEq by mouth daily.   pramipexole 0.25 MG tablet Commonly known as: MIRAPEX Take 0.5 mg by mouth at bedtime.   saccharomyces boulardii 250 MG capsule Commonly known as: FLORASTOR Take 250 mg by mouth 2 (two) times daily.   sennosides-docusate sodium 8.6-50 MG tablet Commonly known as: SENOKOT-S Take 2 tablets by mouth at bedtime.   simvastatin 10 MG tablet Commonly known as: ZOCOR Take 10 mg by mouth at bedtime.   tiotropium 18 MCG inhalation capsule Commonly known as: Spiriva HandiHaler INHALE CONTENTS OF ONE CAPSULE ONCE DAILY FOR COPD. What changed:   how much to take  how to take this  when to take this  additional instructions   triamcinolone 0.1 % Commonly known as: KENALOG Apply 1 application topically daily. Mix with cerave       Review of Systems  Constitutional: Negative for appetite change, fatigue and fever.  HENT: Positive for hearing loss. Negative for congestion and voice change.   Eyes: Negative for visual disturbance.  Respiratory: Positive for shortness of breath. Negative for cough and wheezing.        DOE is chronic  Cardiovascular: Positive for leg swelling.  Gastrointestinal: Negative for abdominal pain and constipation.       Acid reflux symptoms.   Genitourinary: Negative for dysuria and urgency.  Musculoskeletal: Positive for arthralgias, gait problem and joint swelling.       ORIF of the left patella.   Skin: Negative for color change.       BLE discoloration.  infected umbilical area, didn't respond to antibiotic  ointment  Neurological: Positive for seizures. Negative for dizziness, tremors, speech difficulty and headaches.       Memory lapses. Hx of seizures. RLS  Psychiatric/Behavioral: Negative for confusion and sleep disturbance. The patient is not nervous/anxious.     Immunization History  Administered Date(s) Administered  . Influenza Split 01/06/2014, 01/15/2017, 01/08/2018, 12/09/2018  . Influenza Whole 01/07/2012, 01/06/2013  . Influenza, High Dose Seasonal PF 12/25/2015, 01/20/2017  . Influenza,inj,Quad PF,6+ Mos 12/21/2014  . Influenza-Unspecified 12/09/2018, 01/18/2020  . Moderna Sars-Covid-2 Vaccination 04/12/2019, 06/05/2019, 02/15/2020  . Pneumococcal Conjugate-13 02/01/2014  . Pneumococcal Polysaccharide-23 12/20/1992, 01/15/2000, 04/08/2004, 06/08/2004  . Td 04/08/2002, 04/22/2002  . Tdap 04/09/2011, 08/17/2015  . Zoster 04/08/2008, 05/29/2014, 05/28/2017  . Zoster Recombinat (Shingrix) 08/27/2017   Pertinent  Health Maintenance Due  Topic Date Due  . INFLUENZA VACCINE  Completed  . DEXA SCAN  Completed  . PNA vac Low Risk Adult  Completed  . MAMMOGRAM  Discontinued   Fall Risk  03/24/2014 10/15/2012  Falls in the past year? No No   Functional Status Survey:    Vitals:   07/04/20 1641  BP: (!) 137/91  Pulse: 97  Resp: 16  Temp: 97.7 F (36.5 C)  SpO2: 92%  Weight: 161 lb 9.6 oz (73.3 kg)  Height: 5\' 2"  (1.575 m)   Body mass index is 29.56 kg/m. Physical Exam Vitals and nursing note reviewed.  Constitutional:      Appearance: Normal appearance.  HENT:     Head: Normocephalic and atraumatic.     Mouth/Throat:     Mouth: Mucous membranes are moist.  Eyes:     Extraocular Movements: Extraocular movements intact.     Conjunctiva/sclera:     Right eye: Right conjunctiva is not injected.     Left eye: Left conjunctiva is not injected.     Pupils: Pupils are equal, round, and reactive to light.  Cardiovascular:     Rate and Rhythm: Normal rate and regular  rhythm.     Heart sounds: Murmur heard.      Comments: PD pulses are not felt.  Pulmonary:     Breath sounds: Rales present.     Comments: Decreased air entry to both lungs. Bibasilar rales.  Abdominal:     General: Bowel sounds are normal.     Palpations: Abdomen is soft.     Tenderness: There is no abdominal tenderness.     Comments: Mid abd surgical scar  Musculoskeletal:     Cervical back: Normal range of motion and neck supple.     Right lower leg: Edema present.     Left lower leg: Edema present.     Comments:  Decreased overhead ROM of the left shoulder. Right elbow olecranon bursa effusion about a ping pong ball size. BLE edema trace  is chronic. Left knee s/p ORIF of the patella in knee is in a hard cast   Skin:    General: Skin is warm and dry.     Findings: Erythema present.     Comments: Brownish skin discoloration BLE.  infected umbilical area, didn't respond to antibiotic ointment, redness, drainage, warmth, swelling, and tenderness noted in the umbilical area.    Neurological:     General: No focal deficit present.     Mental Status: She is alert and oriented to person, place, and time. Mental status is at baseline.     Motor: No weakness.     Coordination: Coordination normal.     Gait: Gait abnormal.  Psychiatric:        Mood and Affect: Mood normal.        Behavior: Behavior normal.        Thought Content: Thought content normal.        Judgment: Judgment normal.     Labs reviewed: Recent Labs    05/02/20 0630 05/26/20 0935 06/08/20 0000 06/13/20 0000  NA 140 141 143 142  K 3.5 3.9 4.1 3.9  CL 104 104 105 108  CO2 25 26 31* 24*  GLUCOSE 99 86  --   --   BUN 25* 27* 24* 22*  CREATININE 0.97 0.94 1.0 0.9  CALCIUM 9.2 9.5 9.4 9.6   Recent Labs    01/11/20 0000 06/08/20 0000  AST 16 16  ALT 11 9  ALKPHOS 109 138*  ALBUMIN 4.5 3.9   Recent Labs  01/11/20 0000 05/02/20 0630 05/26/20 0935 06/08/20 0000  WBC 5.1 6.3 5.7 5.5  NEUTROABS  2,953.00  --   --  3,515.00  HGB 14.5 13.6 14.7 14.2  HCT 44 42.6 46.5* 43  MCV  --  89.7 91.7  --   PLT 249 223 253 247   Lab Results  Component Value Date   TSH 1.79 06/29/2019   No results found for: HGBA1C Lab Results  Component Value Date   CHOL 164 10/12/2019   HDL 67 10/12/2019   LDLCALC 82 10/12/2019   TRIG 70 10/12/2019   CHOLHDL 3.6 06/27/2015    Significant Diagnostic Results in last 30 days:  No results found.  Assessment/Plan: Cellulitis of umbilicus  infected umbilical area, noted redness, drainage, warmth, tenderness, swelling in the area, didn't respond to antibiotic ointment, the patient desires oral antibiotic for treatment. Will treat with Keflex 500mg  tid x 7 days.    Nondisplaced transverse fracture of left patella, subsequent encounter for closed fracture with nonunion Fx of the left patella from a mechanical fall in the bathroom at [facility] 04/26/20. Resulted in a closed nondisplaced transverse fracture left patella, f/u Orthos/pORIF. prn Oxycodone for pain.None weight bearing.in hard cast.   GERD (gastroesophageal reflux disease) ST eval significant reflux for several weeks results of mucus production, no observed.oropharyngeal dysphagia. TakesOmeprazole. Hgb 14.2 06/08/20, improved.   Restless legs syndrome (RLS)  takes MiraPex 0.5mg  hs   Senile dementia (HCC) No behavioral issues, on Memantine 10mg  bid.   Seizure disorder ([place]) stable, on Keppra 500mg  qhs, 250mg  qd, Lamictal 150mg  bid.   Edema trace, on Furosemide,Bun/creat 22/0.88 06/13/20   Generalized osteoarthritis of multiple sites OA in general, on Tylenol 650mg  qd.   Olecranon bursitis of right elbow Olecranon bursitis,01/19/20 Ortho limit use LUE as tolerated. R elbow olecranon bursitis, uses pad prn  Essential hypertension Blood pressure is controlled, continue Amlodipine.   COPD (chronic obstructive pulmonary disease) (HCC) takes Spiriva qd, prn Albuterol  HFA  Slow transit constipation Stable,  takes Senokot, MiraLax.  Senile osteoporosis  stopped Foxamax per her request.    Family/ staff Communication: plan of care reviewed with the patient and charge nurse.   Labs/tests ordered:  None  Time spend 35 minutes.

## 2020-07-04 ENCOUNTER — Encounter: Payer: Self-pay | Admitting: Nurse Practitioner

## 2020-07-04 NOTE — Assessment & Plan Note (Signed)
No behavioral issues, on Memantine 10mg  bid.

## 2020-07-04 NOTE — Assessment & Plan Note (Signed)
takes Spiriva qd, prn Albuterol HFA

## 2020-07-04 NOTE — Assessment & Plan Note (Signed)
stable, on Keppra 500mg  qhs, 250mg  qd, Lamictal 150mg  bid.

## 2020-07-04 NOTE — Assessment & Plan Note (Signed)
Blood pressure is controlled, continue Amlodipine.  

## 2020-07-04 NOTE — Assessment & Plan Note (Signed)
stopped Foxamax per her request.

## 2020-07-04 NOTE — Assessment & Plan Note (Signed)
Stable,  takes Senokot, MiraLax.

## 2020-07-04 NOTE — Assessment & Plan Note (Signed)
Fx of the left patella from a mechanical fall in the bathroom at [facility] 04/26/20. Resulted in a closed nondisplaced transverse fracture left patella, f/u Orthos/pORIF. prn Oxycodone for pain.None weight bearing.in hard cast.

## 2020-07-04 NOTE — Assessment & Plan Note (Signed)
infected umbilical area, noted redness, drainage, warmth, tenderness, swelling in the area, didn't respond to antibiotic ointment, the patient desires oral antibiotic for treatment. Will treat with Keflex 500mg  tid x 7 days.

## 2020-07-04 NOTE — Assessment & Plan Note (Signed)
trace, on Furosemide,Bun/creat 22/0.88 06/13/20

## 2020-07-04 NOTE — Assessment & Plan Note (Signed)
OA in general, on Tylenol 650mg  qd.

## 2020-07-04 NOTE — Assessment & Plan Note (Signed)
Olecranon bursitis,01/19/20 Ortho limit use LUE as tolerated. R elbow olecranon bursitis, uses pad prn

## 2020-07-04 NOTE — Assessment & Plan Note (Signed)
ST eval significant reflux for several weeks results of mucus production, no observed.oropharyngeal dysphagia. TakesOmeprazole. Hgb 14.2 06/08/20, improved.

## 2020-07-04 NOTE — Assessment & Plan Note (Signed)
takes MiraPex 0.5mg  hs

## 2020-07-05 ENCOUNTER — Encounter: Payer: Self-pay | Admitting: Nurse Practitioner

## 2020-07-12 DIAGNOSIS — S82002D Unspecified fracture of left patella, subsequent encounter for closed fracture with routine healing: Secondary | ICD-10-CM | POA: Diagnosis not present

## 2020-07-13 ENCOUNTER — Other Ambulatory Visit: Payer: Self-pay

## 2020-07-13 NOTE — Telephone Encounter (Signed)
Refill request received from [facility] for oxycodone 5mg  one every 6 hours as needed for pain. Medication pended and sent to Dr. Lyndel Safe for approval.

## 2020-07-14 MED ORDER — OXYCODONE HCL 5 MG PO TABS: 5.0000 mg | ORAL_TABLET | Freq: Four times a day (QID) | ORAL | 0 refills | Status: DC | PRN

## 2020-07-31 ENCOUNTER — Non-Acute Institutional Stay (SKILLED_NURSING_FACILITY): Payer: Medicare PPO | Admitting: Nurse Practitioner

## 2020-07-31 DIAGNOSIS — I10 Essential (primary) hypertension: Secondary | ICD-10-CM | POA: Diagnosis not present

## 2020-07-31 DIAGNOSIS — K219 Gastro-esophageal reflux disease without esophagitis: Secondary | ICD-10-CM | POA: Diagnosis not present

## 2020-07-31 DIAGNOSIS — R609 Edema, unspecified: Secondary | ICD-10-CM

## 2020-07-31 DIAGNOSIS — K5901 Slow transit constipation: Secondary | ICD-10-CM | POA: Diagnosis not present

## 2020-07-31 DIAGNOSIS — J431 Panlobular emphysema: Secondary | ICD-10-CM | POA: Diagnosis not present

## 2020-07-31 DIAGNOSIS — M159 Polyosteoarthritis, unspecified: Secondary | ICD-10-CM

## 2020-07-31 DIAGNOSIS — F0390 Unspecified dementia without behavioral disturbance: Secondary | ICD-10-CM | POA: Diagnosis not present

## 2020-07-31 DIAGNOSIS — G2581 Restless legs syndrome: Secondary | ICD-10-CM | POA: Diagnosis not present

## 2020-07-31 DIAGNOSIS — M810 Age-related osteoporosis without current pathological fracture: Secondary | ICD-10-CM

## 2020-07-31 DIAGNOSIS — G40909 Epilepsy, unspecified, not intractable, without status epilepticus: Secondary | ICD-10-CM | POA: Diagnosis not present

## 2020-07-31 NOTE — Assessment & Plan Note (Signed)
Stable, continue Senokot, MiraLax.  

## 2020-07-31 NOTE — Assessment & Plan Note (Signed)
No behavioral issues,  on Memantine 10mg bid. 

## 2020-07-31 NOTE — Assessment & Plan Note (Signed)
Restless leg symptom, takes MiraPex 0.5mg hs 

## 2020-07-31 NOTE — Assessment & Plan Note (Signed)
stable, on Keppra 500mg qhs, 250mg qd, Lamictal 150mg bid.  

## 2020-07-31 NOTE — Assessment & Plan Note (Signed)
Blood pressure is controlled, continue Amlodipine.  

## 2020-07-31 NOTE — Assessment & Plan Note (Signed)
takes Spiriva qd, prn Albuterol HFA

## 2020-07-31 NOTE — Assessment & Plan Note (Signed)
stopped Foxamax per her request.  

## 2020-07-31 NOTE — Assessment & Plan Note (Signed)
OA in general, on Tylenol 650mg  qd. Olecranon bursitis,01/19/20 Ortho limit use LUE as tolerated. R elbow olecranon bursitis, uses pad prn  Fx of the left patella from a mechanical fall in the bathroom at [facility] 04/26/20. Resulted in a closed nondisplaced transverse fracture left patella, f/u Orthos/pORIF. prn Oxycodone for pain. WBAT, in brace, working with therapy.

## 2020-07-31 NOTE — Progress Notes (Signed)
Location:   SNF [place] Room Number: 85 Place of Service:  SNF (31) Provider: [facility] Trevonte Ashkar NP  [name], MD  Patient Care Team: [name], MD as PCP - General (Internal Medicine) [name], MD as Consulting Physician (Ophthalmology) [name], MD as Consulting Physician (Cardiology) [name], MD as Consulting Physician (Dermatology) [name], MD as Consulting Physician (Orthopedic Surgery) [name], MD as Consulting Physician (Orthopedic Surgery) [name], MD as Consulting Physician (Orthopedic Surgery) [place], [place] Kaide Gage X, NP as Nurse Practitioner (Nurse Practitioner) [name], MD as Consulting Physician (Neurology)  Extended Emergency Contact Information Primary Emergency Contact: [name],[name] Address: [address]          [place], [place] [address] [name] of [place] Phone: [phone] Relation: Sister Secondary Emergency Contact: [name] [place] of [place] Phone: [phone] Mobile Phone: [phone] Relation: Sister  Code Status:  DNR Goals of care: Advanced Directive information Advanced Directives 05/26/2020  Does Patient Have a Medical Advance Directive? Yes  Type of Paramedic of [place];Living will  Does patient want to make changes to medical advance directive? -  Copy of [place] in Chart? -  Pre-existing out of facility DNR order (yellow form or pink MOST form) -     Chief Complaint  Patient presents with  . Medical Management of Chronic Issues    HPI:  Pt is a 87 y.o. female seen today for medical management of chronic diseases.    Fx of the left patella from a mechanical fall in the bathroom at [facility] 04/26/20. Resulted in a closed nondisplaced transverse fracture left patella, f/u Orthos/pORIF. prn Oxycodone for pain. WBAT, in brace, working with therapy.  GERD ST  eval significant reflux for several weeks results of mucus production,no observed.oropharyngeal dysphagia. TakesOmeprazole. Hgb 14.2 06/08/20, improved. Restless leg symptom, takes MiraPex 0.5mg  hs Hx of dementia,on Memantine 10mg  bid.  Hx of seizures, stable, on Keppra 500mg  qhs, 250mg  qd, Lamictal 150mg  bid.  Edema, trace, on Furosemide,Bun/creat17/0.99 eGFR 60 08/03/20 OA in general, on Tylenol 650mg  qd. Olecranon bursitis,01/19/20 Ortho limit use LUE as tolerated. R elbow olecranon bursitis, uses pad prn HTN, takes Amlodipine.  COPD, takes Spiriva qd, prn Albuterol HFA Constipation, takes Senokot, MiraLax. OP, stopped Foxamax per her request.    Past Medical History:  Diagnosis Date  . Acute bronchitis 05/23/2011  . Acute upper respiratory infections of unspecified site 05/23/2011  . Arthritis   . Chronic airway obstruction, not elsewhere classified 05/23/2011  . Disturbance of salivary secretion 01/31/2011  . Dizziness and giddiness 01/31/2011  . Dyspnea   . Essential tremor 04/25/2014  . External hemorrhoids without mention of complication 02/77/4128  . Gait disorder 04/25/2014  . Insomnia, unspecified 09/12/2011  . Lumbago 01/31/2011  . Major depressive disorder, single episode, unspecified 01/31/2011  . Memory disorder 04/25/2014  . Mitral valve disorders(424.0) 01/31/2011  . Other and unspecified hyperlipidemia 01/31/2011  . Other convulsions 01/31/2011  . Other emphysema ([place]) 01/31/2011  . Pain in joint, site unspecified 01/31/2011  . Restless legs syndrome (RLS) 09/12/2011  . Retinal detachment with retinal defect of right eye 2011   right eye twice  . Seizure disorder ([place])   . Senile osteoporosis 01/31/2011  . Spontaneous ecchymoses 01/31/2011  . Stiffness of joints, not elsewhere classified, multiple sites 01/31/2011  . Unspecified  essential hypertension 01/31/2011   Past Surgical  History:  Procedure Laterality Date  . ABDOMINAL HYSTERECTOMY  06/21/2003   TAH/BSO, omenectomy PSB resect, Stg IC cystadenofibroma  . CHOLECYSTECTOMY  2005   Dr. Marlou Starks  . ELBOW SURGERY Right 2008   broken   Dr. Apolonio Schneiders  . EYE SURGERY    . ORIF PATELLA Left 05/02/2020   Procedure: OPEN REDUCTION INTERNAL (ORIF) FIXATION LEFT PATELLA WITH MEDIAL AND LATERAL LIGAMENT REINFORCEMENTS;  Surgeon: [name], MD;  Location: [facility] ORS;  Service: Orthopedics;  Laterality: Left;  . ORIF PATELLA Left 05/30/2020   Procedure: OPEN REDUCTION INTERNAL (ORIF) FIXATION PATELLA;  Surgeon: [name], MD;  Location: [facility] ORS;  Service: Orthopedics;  Laterality: Left;  . RETINAL DETACHMENT SURGERY N/A    two  . REVERSE SHOULDER ARTHROPLASTY Left 05/06/2019   Procedure: REVERSE SHOULDER ARTHROPLASTY;  Surgeon: Justice Britain, MD;  Location: [facility] ORS;  Service: Orthopedics;  Laterality: Left;  123min  . ROTATOR CUFF REPAIR Right 2012   Dr. Theda Sers  . SQUAMOUS CELL CARCINOMA EXCISION Bilateral 2012, 8/14   [place] on legs   Dr. Sarajane Jews  . TONSILLECTOMY  1941  . VIDEO BRONCHOSCOPY WITH ENDOBRONCHIAL NAVIGATION N/A 11/29/2015   Procedure: VIDEO BRONCHOSCOPY WITH ENDOBRONCHIAL NAVIGATION;  Surgeon: Collene Gobble, MD;  Location: [facility] OR;  Service: Thoracic;  Laterality: N/A;    Allergies  Allergen Reactions  . Dyazide [Hydrochlorothiazide W-Triamterene]     Other reaction(s): her blood pressure too much  . Latex Other (See Comments)    Swelling  . Other     Other reaction(s): local red reaction   . Rofecoxib     Other reaction(s): shortness of breath  . Sulfa Antibiotics Nausea And Vomiting  . Tetracycline Hcl     Other reaction(s): Cant take due to a drug interation    Allergies as of 07/31/2020      Reactions   Dyazide [hydrochlorothiazide W-triamterene]    Other reaction(s): her blood pressure too much   Latex Other (See Comments)   Swelling    Other    Other reaction(s): local red reaction   Rofecoxib    Other reaction(s): shortness of breath   Sulfa Antibiotics Nausea And Vomiting   Tetracycline Hcl    Other reaction(s): Cant take due to a drug interation      Medication List       Accurate as of July 31, 2020 11:59 PM. If you have any questions, ask your nurse or doctor.        acetaminophen 500 MG tablet Commonly known as: TYLENOL Take 2 tablets (1,000 mg total) by mouth every 6 (six) hours as needed. What changed: reasons to take this   amLODipine 2.5 MG tablet Commonly known as: NORVASC Take 2.5 mg by mouth daily.   aspirin 81 MG chewable tablet Chew 81 mg by mouth daily.   bacitracin 500 UNIT/GM ointment Apply 1 application topically 2 (two) times daily.   CeraVe Crea Apply 1 application topically daily. Mix cream with Triamcinolone   cholecalciferol 1000 units tablet Commonly known as: VITAMIN D Take 1 tablet (1,000 Units total) by mouth daily.   furosemide 20 MG tablet Commonly known as: LASIX Take 20 mg by mouth daily.   hydrocortisone 2.5 % lotion Apply 1 application topically daily as needed (psoriasis).   ketoconazole 2 % cream Commonly known as: NIZORAL Apply 1 application topically as needed (for psoriasis).   lamoTRIgine 150 MG tablet Commonly known as: LAMICTAL TAKE 1 TABLET BY MOUTH TWICE DAILY. What changed: when to  take this   levETIRAcetam 500 MG tablet Commonly known as: KEPPRA Take 500 mg by mouth at bedtime.   levETIRAcetam 250 MG tablet Commonly known as: KEPPRA Take 250 mg by mouth daily. In the morning   memantine 10 MG tablet Commonly known as: NAMENDA TAKE 1 TABLET BY MOUTH TWICE DAILY.   nystatin powder Commonly known as: MYCOSTATIN/NYSTOP Apply 1 application topically 2 (two) times daily as needed (tinea cruris).   oxyCODONE 5 MG immediate release tablet Commonly known as: Oxy IR/ROXICODONE Take 1 tablet (5 mg total) by mouth every 6 (six) hours as  needed for severe pain.   pantoprazole 40 MG tablet Commonly known as: PROTONIX Take 40 mg by mouth daily.   polyethylene glycol 17 g packet Commonly known as: MIRALAX / GLYCOLAX Take 17 g by mouth daily.   Potassium Chloride ER 20 MEQ Tbcr Take 20 mEq by mouth daily.   pramipexole 0.25 MG tablet Commonly known as: MIRAPEX Take 0.5 mg by mouth at bedtime.   sennosides-docusate sodium 8.6-50 MG tablet Commonly known as: SENOKOT-S Take 2 tablets by mouth at bedtime.   simvastatin 10 MG tablet Commonly known as: ZOCOR Take 10 mg by mouth at bedtime.   tiotropium 18 MCG inhalation capsule Commonly known as: Spiriva HandiHaler INHALE CONTENTS OF ONE CAPSULE ONCE DAILY FOR COPD. What changed:   how much to take  how to take this  when to take this  additional instructions   triamcinolone cream 0.1 % Commonly known as: KENALOG Apply 1 application topically daily. Mix with cerave       Review of Systems  Constitutional: Negative for fatigue, fever and unexpected weight change.  HENT: Positive for hearing loss. Negative for congestion and voice change.   Eyes: Negative for visual disturbance.  Respiratory: Positive for shortness of breath. Negative for cough and wheezing.        DOE is chronic  Cardiovascular: Positive for leg swelling.  Gastrointestinal: Negative for abdominal pain and constipation.       Acid reflux symptoms.   Genitourinary: Negative for dysuria and urgency.  Musculoskeletal: Positive for arthralgias, gait problem and joint swelling.       ORIF of the left patella.   Skin: Negative for color change.       BLE discoloration.   Neurological: Positive for seizures. Negative for dizziness, tremors, speech difficulty and headaches.       Memory lapses. Hx of seizures. RLS  Psychiatric/Behavioral: Negative for confusion and sleep disturbance. The patient is not nervous/anxious.     Immunization History  Administered Date(s) Administered  .  Influenza Split 01/06/2014, 01/15/2017, 01/08/2018, 12/09/2018  . Influenza Whole 01/07/2012, 01/06/2013  . Influenza, High Dose Seasonal PF 12/25/2015, 01/20/2017  . Influenza,inj,Quad PF,6+ Mos 12/21/2014  . Influenza-Unspecified 12/09/2018, 01/18/2020  . Moderna Sars-Covid-2 Vaccination 04/12/2019, 06/05/2019, 02/15/2020  . Pneumococcal Conjugate-13 02/01/2014  . Pneumococcal Polysaccharide-23 12/20/1992, 01/15/2000, 04/08/2004, 06/08/2004  . Td 04/08/2002, 04/22/2002  . Tdap 04/09/2011, 08/17/2015  . Zoster 04/08/2008, 05/29/2014, 05/28/2017  . Zoster Recombinat (Shingrix) 08/27/2017   Pertinent  Health Maintenance Due  Topic Date Due  . INFLUENZA VACCINE  11/06/2020  . DEXA SCAN  Completed  . PNA vac Low Risk Adult  Completed  . MAMMOGRAM  Discontinued   Fall Risk  03/24/2014 10/15/2012  Falls in the past year? No No   Functional Status Survey:    Vitals:   07/31/20 1123  BP: 137/78  Pulse: 89  Resp: 20  Temp: (!) 97.2 F (  36.2 C)  SpO2: 93%   There is no height or weight on file to calculate BMI. Physical Exam Vitals and nursing note reviewed.  Constitutional:      Appearance: Normal appearance.  HENT:     Head: Normocephalic and atraumatic.  Eyes:     Extraocular Movements: Extraocular movements intact.     Conjunctiva/sclera:     Right eye: Right conjunctiva is not injected.     Left eye: Left conjunctiva is not injected.     Pupils: Pupils are equal, round, and reactive to light.  Cardiovascular:     Rate and Rhythm: Normal rate and regular rhythm.     Heart sounds: Murmur heard.      Comments: PD pulses are not felt.  Pulmonary:     Breath sounds: Rales present.     Comments: Decreased air entry to both lungs. Bibasilar rales.  Abdominal:     General: Bowel sounds are normal.     Palpations: Abdomen is soft.     Tenderness: There is no abdominal tenderness.     Comments: Mid abd surgical scar  Musculoskeletal:     Cervical back: Normal range of  motion and neck supple.     Right lower leg: Edema present.     Left lower leg: Edema present.     Comments:  Decreased overhead ROM of the left shoulder. Right elbow olecranon bursa effusion about a ping pong ball size. BLE edema trace  is chronic. Left knee s/p ORIF of the patella is in brace now  Skin:    General: Skin is warm and dry.     Findings: Erythema present.     Comments: Brownish skin discoloration BLE.   Neurological:     General: No focal deficit present.     Mental Status: She is alert and oriented to person, place, and time. Mental status is at baseline.     Motor: No weakness.     Coordination: Coordination normal.     Gait: Gait abnormal.  Psychiatric:        Mood and Affect: Mood normal.        Behavior: Behavior normal.        Thought Content: Thought content normal.        Judgment: Judgment normal.     Labs reviewed: Recent Labs    05/02/20 0630 05/26/20 0935 06/08/20 0000 06/13/20 0000  NA 140 141 143 142  K 3.5 3.9 4.1 3.9  CL 104 104 105 108  CO2 25 26 31* 24*  GLUCOSE 99 86  --   --   BUN 25* 27* 24* 22*  CREATININE 0.97 0.94 1.0 0.9  CALCIUM 9.2 9.5 9.4 9.6   Recent Labs    01/11/20 0000 06/08/20 0000  AST 16 16  ALT 11 9  ALKPHOS 109 138*  ALBUMIN 4.5 3.9   Recent Labs    01/11/20 0000 05/02/20 0630 05/26/20 0935 06/08/20 0000  WBC 5.1 6.3 5.7 5.5  NEUTROABS 2,953.00  --   --  3,515.00  HGB 14.5 13.6 14.7 14.2  HCT 44 42.6 46.5* 43  MCV  --  89.7 91.7  --   PLT 249 223 253 247   Lab Results  Component Value Date   TSH 1.79 06/29/2019   No results found for: HGBA1C Lab Results  Component Value Date   CHOL 164 10/12/2019   HDL 67 10/12/2019   LDLCALC 82 10/12/2019   TRIG 70 10/12/2019   CHOLHDL 3.6 06/27/2015  Significant Diagnostic Results in last 30 days:  No results found.  Assessment/Plan  Generalized osteoarthritis of multiple sites  OA in general, on Tylenol $RemoveBe'650mg'CXRCteeCu$  qd. Olecranon  bursitis,01/19/20 Ortho limit use LUE as tolerated. R elbow olecranon bursitis, uses pad prn  Fx of the left patella from a mechanical fall in the bathroom at [facility] 04/26/20. Resulted in a closed nondisplaced transverse fracture left patella, f/u Orthos/pORIF. prn Oxycodone for pain. WBAT, in brace, working with therapy.   GERD (gastroesophageal reflux disease) ST eval significant reflux for several weeks results of mucus production,no observed.oropharyngeal dysphagia. TakesOmeprazole. Hgb 14.2 06/08/20, improved.  Restless legs syndrome (RLS) Restless leg symptom, takes MiraPex 0.$RemoveBeforeD'5mg'okvKMnoMHEtbMR$  hs   Senile dementia (HCC) No behavioral issues, on Memantine $RemoveBefo'10mg'DATiAzxCycQ$  bid.   Seizure disorder ([place]) stable, on Keppra $RemoveB'500mg'cVhTrfxR$  qhs, $Rem'250mg'drGI$  qd, Lamictal $RemoveBefo'150mg'hiGOYVuQCmk$  bid.   Edema Edema, trace, on Furosemide,Bun/creat17/0.99 eGFR 60 08/03/20   Essential hypertension Blood pressure is controlled, continue Amlodipine.    COPD (chronic obstructive pulmonary disease) (HCC) takes Spiriva qd, prn Albuterol HFA  Slow transit constipation Stable, continue Senokot, MiraLax.   Senile osteoporosis stopped Foxamax per her request.   Family/ staff Communication: plan of care reviewed with the patient and charge nurse.   Labs/tests ordered:  None  Time spend 35 minutes.

## 2020-07-31 NOTE — Assessment & Plan Note (Addendum)
Edema, trace, on Furosemide,Bun/creat17/0.99 eGFR 60 08/03/20

## 2020-07-31 NOTE — Assessment & Plan Note (Signed)
ST eval significant reflux for several weeks results of mucus production, no observed.oropharyngeal dysphagia. TakesOmeprazole. Hgb 14.2 06/08/20, improved.  

## 2020-08-03 DIAGNOSIS — I10 Essential (primary) hypertension: Secondary | ICD-10-CM | POA: Diagnosis not present

## 2020-08-04 ENCOUNTER — Encounter: Payer: Self-pay | Admitting: Nurse Practitioner

## 2020-08-04 LAB — BASIC METABOLIC PANEL
BUN: 17 (ref 4–21)
CO2: 27 — AB (ref 13–22)
Chloride: 104 (ref 99–108)
Creatinine: 1 (ref 0.5–1.1)
Glucose: 88
Potassium: 4.6 (ref 3.4–5.3)
Sodium: 142 (ref 137–147)

## 2020-08-04 LAB — COMPREHENSIVE METABOLIC PANEL
Calcium: 10 (ref 8.7–10.7)
GFR calc Af Amer: 60
GFR calc non Af Amer: 52

## 2020-08-09 DIAGNOSIS — S82002D Unspecified fracture of left patella, subsequent encounter for closed fracture with routine healing: Secondary | ICD-10-CM | POA: Diagnosis not present

## 2020-08-16 ENCOUNTER — Non-Acute Institutional Stay (SKILLED_NURSING_FACILITY): Payer: Medicare PPO | Admitting: Nurse Practitioner

## 2020-08-16 ENCOUNTER — Encounter: Payer: Self-pay | Admitting: Nurse Practitioner

## 2020-08-16 DIAGNOSIS — I10 Essential (primary) hypertension: Secondary | ICD-10-CM

## 2020-08-16 DIAGNOSIS — S0003XA Contusion of scalp, initial encounter: Secondary | ICD-10-CM

## 2020-08-16 DIAGNOSIS — R269 Unspecified abnormalities of gait and mobility: Secondary | ICD-10-CM

## 2020-08-16 DIAGNOSIS — J431 Panlobular emphysema: Secondary | ICD-10-CM

## 2020-08-16 DIAGNOSIS — S82035K Nondisplaced transverse fracture of left patella, subsequent encounter for closed fracture with nonunion: Secondary | ICD-10-CM | POA: Diagnosis not present

## 2020-08-16 DIAGNOSIS — G40909 Epilepsy, unspecified, not intractable, without status epilepticus: Secondary | ICD-10-CM

## 2020-08-16 DIAGNOSIS — G2581 Restless legs syndrome: Secondary | ICD-10-CM | POA: Diagnosis not present

## 2020-08-16 DIAGNOSIS — M7021 Olecranon bursitis, right elbow: Secondary | ICD-10-CM

## 2020-08-16 DIAGNOSIS — M810 Age-related osteoporosis without current pathological fracture: Secondary | ICD-10-CM

## 2020-08-16 DIAGNOSIS — K5901 Slow transit constipation: Secondary | ICD-10-CM

## 2020-08-16 DIAGNOSIS — K219 Gastro-esophageal reflux disease without esophagitis: Secondary | ICD-10-CM | POA: Diagnosis not present

## 2020-08-16 DIAGNOSIS — R609 Edema, unspecified: Secondary | ICD-10-CM

## 2020-08-16 DIAGNOSIS — W19XXXA Unspecified fall, initial encounter: Secondary | ICD-10-CM | POA: Diagnosis not present

## 2020-08-16 DIAGNOSIS — F0390 Unspecified dementia without behavioral disturbance: Secondary | ICD-10-CM | POA: Diagnosis not present

## 2020-08-16 DIAGNOSIS — M159 Polyosteoarthritis, unspecified: Secondary | ICD-10-CM

## 2020-08-16 NOTE — Assessment & Plan Note (Signed)
takes Senokot, MiraLax.  

## 2020-08-16 NOTE — Assessment & Plan Note (Signed)
takes Spiriva qd, prn Albuterol HFA

## 2020-08-16 NOTE — Assessment & Plan Note (Signed)
Blood pressure is controlled, continue Amlodipine.  

## 2020-08-16 NOTE — Assessment & Plan Note (Signed)
stable, on Keppra 500mg qhs, 250mg qd, Lamictal 150mg bid.  

## 2020-08-16 NOTE — Assessment & Plan Note (Signed)
ST eval significant reflux for several weeks results of mucus production, no observed.oropharyngeal dysphagia. TakesOmeprazole. Hgb 14.2 06/08/20, improved.  

## 2020-08-16 NOTE — Assessment & Plan Note (Signed)
trace, on Furosemide,  Bun/creat 17/0.99 eGFR 60 08/03/20 

## 2020-08-16 NOTE — Assessment & Plan Note (Signed)
01/19/20 Ortho limit use LUE as tolerated. R elbow olecranon bursitis, uses pad prn  

## 2020-08-16 NOTE — Assessment & Plan Note (Signed)
Fx of the left patella from a mechanical fall in the bathroom at [facility] 04/26/20. Resulted in a closed nondisplaced transverse fracture left patella, f/u Ortho s/p  ORIF.  prn Oxycodone for pain. WBAT, in brace, working with therapy.  

## 2020-08-16 NOTE — Assessment & Plan Note (Signed)
Mechanical fall, PT to eval/tx.

## 2020-08-16 NOTE — Assessment & Plan Note (Signed)
Controlled, takes MiraPex 0.5mg  hs

## 2020-08-16 NOTE — Assessment & Plan Note (Signed)
on Tylenol 650mg qd.  

## 2020-08-16 NOTE — Progress Notes (Addendum)
Location:   [facility] Room Number: [place] of Service:  SNF (31) Provider:  Philis Doke Otho Darner, NP    Patient Care Team: [name], MD as PCP - General (Internal Medicine) [name], MD as Consulting Physician (Ophthalmology) [name], MD as Consulting Physician (Cardiology) [name], MD as Consulting Physician (Dermatology) [name], MD as Consulting Physician (Orthopedic Surgery) [name], MD as Consulting Physician (Orthopedic Surgery) [name], MD as Consulting Physician (Orthopedic Surgery) [place], [place] Edem Tiegs X, NP as Nurse Practitioner (Nurse Practitioner) [name], MD as Consulting Physician (Neurology)  Extended Emergency Contact Information Primary Emergency Contact: [name],[name] Address: [address]          [place], [place] [address] [name] of [place] Phone: [phone] Relation: Sister Secondary Emergency Contact: [name] [place] of [place] Phone: [phone] Mobile Phone: [phone] Relation: Sister  Code Status:  DNR Goals of care: Advanced Directive information Advanced Directives 08/16/2020  Does Patient Have a Medical Advance Directive? Yes  Type of Advance Directive Out of facility DNR (pink MOST or yellow form)  Does patient want to make changes to medical advance directive? No - Patient declined  Copy of [place] in Chart? -  Pre-existing out of facility DNR order (yellow form or pink MOST form) Yellow form placed in chart (order not valid for inpatient use);Pink MOST form placed in chart (order not valid for inpatient use)     Chief Complaint  Patient presents with  . Acute Visit    Fall    HPI:  Pt is a 87 y.o. female seen today for an acute visit for the right parietal hematoma, steri strips closure intact, no active bleeding sustained from a mechanical fall when the patient stood up,  reach over for a soda drink. The patient said her gripper socks were not slip resistant. Denied dizziness, change of vision, headache, chest pain, palpitation, or focal weakness associated with falling.    Fx of the left patella from a mechanical fall in the bathroom at [facility] 04/26/20.Resulted in aclosed nondisplaced transverse fracture left patella, f/u Orthos/pORIF. prn Oxycodone for pain. WBAT, in brace, working with therapy.  GERD ST eval significant reflux for several weeks results of mucus production,no observed.oropharyngeal dysphagia. TakesOmeprazole. Hgb 14.2 06/08/20, improved. Restless leg symptom, takes MiraPex 0.5mg  hs Hx of dementia,on Memantine 10mg  bid.  Hx of seizures, stable, on Keppra 500mg  qhs, 250mg  qd, Lamictal 150mg  bid.  Edema, trace, on Furosemide,Bun/creat17/0.99 eGFR 60 08/03/20 OA in general, on Tylenol 650mg  qd. Olecranon bursitis,01/19/20 Ortho limit use LUE as tolerated. R elbow olecranon bursitis, uses pad prn HTN, takes Amlodipine.  COPD, takes Spiriva qd, prn Albuterol HFA Constipation, takes Senokot, MiraLax. OP, stopped Foxamax per her request.    Past Medical History:  Diagnosis Date  . Acute bronchitis 05/23/2011  . Acute upper respiratory infections of unspecified site 05/23/2011  . Arthritis   . Chronic airway obstruction, not elsewhere classified 05/23/2011  . Disturbance of salivary secretion 01/31/2011  . Dizziness and giddiness 01/31/2011  . Dyspnea   . Essential tremor 04/25/2014  . External hemorrhoids without mention of complication 48/18/5631  . Gait disorder 04/25/2014  . Insomnia, unspecified 09/12/2011  . Lumbago 01/31/2011  . Major depressive disorder, single episode, unspecified 01/31/2011  . Memory disorder 04/25/2014  . Mitral valve disorders(424.0) 01/31/2011  . Other and unspecified  hyperlipidemia 01/31/2011  .  Other convulsions 01/31/2011  . Other emphysema ([place]) 01/31/2011  . Pain in joint, site unspecified 01/31/2011  . Restless legs syndrome (RLS) 09/12/2011  . Retinal detachment with retinal defect of right eye 2011   right eye twice  . Seizure disorder ([place])   . Senile osteoporosis 01/31/2011  . Spontaneous ecchymoses 01/31/2011  . Stiffness of joints, not elsewhere classified, multiple sites 01/31/2011  . Unspecified essential hypertension 01/31/2011   Past Surgical History:  Procedure Laterality Date  . ABDOMINAL HYSTERECTOMY  06/21/2003   TAH/BSO, omenectomy PSB resect, Stg IC cystadenofibroma  . CHOLECYSTECTOMY  2005   Dr. Marlou Starks  . ELBOW SURGERY Right 2008   broken   Dr. Apolonio Schneiders  . EYE SURGERY    . ORIF PATELLA Left 05/02/2020   Procedure: OPEN REDUCTION INTERNAL (ORIF) FIXATION LEFT PATELLA WITH MEDIAL AND LATERAL LIGAMENT REINFORCEMENTS;  Surgeon: [name], MD;  Location: [facility] ORS;  Service: Orthopedics;  Laterality: Left;  . ORIF PATELLA Left 05/30/2020   Procedure: OPEN REDUCTION INTERNAL (ORIF) FIXATION PATELLA;  Surgeon: [name], MD;  Location: [facility];  Service: Orthopedics;  Laterality: Left;  . RETINAL DETACHMENT SURGERY N/A    two  . REVERSE SHOULDER ARTHROPLASTY Left 05/06/2019   Procedure: REVERSE SHOULDER ARTHROPLASTY;  Surgeon: Justice Britain, MD;  Location: [facility];  Service: Orthopedics;  Laterality: Left;  161min  . ROTATOR CUFF REPAIR Right 2012   Dr. Theda Sers  . SQUAMOUS CELL CARCINOMA EXCISION Bilateral 2012, 8/14   [place] on legs   Dr. Sarajane Jews  . TONSILLECTOMY  1941  . VIDEO BRONCHOSCOPY WITH ENDOBRONCHIAL NAVIGATION N/A 11/29/2015   Procedure: VIDEO BRONCHOSCOPY WITH ENDOBRONCHIAL NAVIGATION;  Surgeon: Collene Gobble, MD;  Location: [facility] OR;  Service: Thoracic;  Laterality: N/A;    Allergies  Allergen Reactions  . Dyazide [Hydrochlorothiazide W-Triamterene]     Other reaction(s): her blood pressure too much  . Latex  Other (See Comments)    Swelling  . Other     Other reaction(s): local red reaction   . Rofecoxib     Other reaction(s): shortness of breath  . Sulfa Antibiotics Nausea And Vomiting  . Tetracycline Hcl     Other reaction(s): Cant take due to a drug interation    Allergies as of 08/16/2020      Reactions   Dyazide [hydrochlorothiazide W-triamterene]    Other reaction(s): her blood pressure too much   Latex Other (See Comments)   Swelling   Other    Other reaction(s): local red reaction   Rofecoxib    Other reaction(s): shortness of breath   Sulfa Antibiotics Nausea And Vomiting   Tetracycline Hcl    Other reaction(s): Cant take due to a drug interation      Medication List       Accurate as of Aug 16, 2020 11:59 PM. If you have any questions, ask your nurse or doctor.        STOP taking these medications   bacitracin 500 UNIT/GM ointment Stopped by: Janequa Kipnis X Lilja Soland, NP     TAKE these medications   acetaminophen 500 MG tablet Commonly known as: TYLENOL Take 2 tablets (1,000 mg total) by mouth every 6 (six) hours as needed. What changed: reasons to take this   amLODipine 2.5 MG tablet Commonly known as: NORVASC Take 2.5 mg by mouth daily.   aspirin 81 MG chewable tablet Chew 81 mg by mouth daily.   CeraVe Crea Apply 1 application topically daily. Mix cream with Triamcinolone  cholecalciferol 1000 units tablet Commonly known as: VITAMIN D Take 1 tablet (1,000 Units total) by mouth daily.   furosemide 20 MG tablet Commonly known as: LASIX Take 20 mg by mouth daily.   hydrocortisone 2.5 % lotion Apply 1 application topically daily as needed (psoriasis).   ketoconazole 2 % cream Commonly known as: NIZORAL Apply 1 application topically as needed (for psoriasis).   lamoTRIgine 150 MG tablet Commonly known as: LAMICTAL TAKE 1 TABLET BY MOUTH TWICE DAILY. What changed: when to take this   levETIRAcetam 500 MG tablet Commonly known as: KEPPRA Take 500 mg by  mouth at bedtime.   levETIRAcetam 250 MG tablet Commonly known as: KEPPRA Take 250 mg by mouth daily. In the morning   memantine 10 MG tablet Commonly known as: NAMENDA TAKE 1 TABLET BY MOUTH TWICE DAILY.   nystatin powder Commonly known as: MYCOSTATIN/NYSTOP Apply 1 application topically 2 (two) times daily as needed (tinea cruris).   oxyCODONE 5 MG immediate release tablet Commonly known as: Oxy IR/ROXICODONE Take 1 tablet (5 mg total) by mouth every 6 (six) hours as needed for severe pain.   pantoprazole 40 MG tablet Commonly known as: PROTONIX Take 40 mg by mouth daily.   polyethylene glycol 17 g packet Commonly known as: MIRALAX / GLYCOLAX Take 17 g by mouth daily.   Potassium Chloride ER 20 MEQ Tbcr Take 20 mEq by mouth daily.   pramipexole 0.25 MG tablet Commonly known as: MIRAPEX Take 0.5 mg by mouth at bedtime.   sennosides-docusate sodium 8.6-50 MG tablet Commonly known as: SENOKOT-S Take 2 tablets by mouth at bedtime.   simvastatin 10 MG tablet Commonly known as: ZOCOR Take 10 mg by mouth at bedtime.   tiotropium 18 MCG inhalation capsule Commonly known as: Spiriva HandiHaler INHALE CONTENTS OF ONE CAPSULE ONCE DAILY FOR COPD. What changed:   how much to take  how to take this  when to take this  additional instructions   triamcinolone cream 0.1 % Commonly known as: KENALOG Apply 1 application topically daily. Mix with cerave       Review of Systems  Constitutional: Negative for activity change, appetite change and fever.  HENT: Positive for hearing loss. Negative for congestion and voice change.   Eyes: Negative for visual disturbance.  Respiratory: Positive for shortness of breath. Negative for cough and wheezing.        DOE is chronic  Cardiovascular: Positive for leg swelling.  Gastrointestinal: Negative for abdominal pain and constipation.       Acid reflux symptoms.   Genitourinary: Negative for dysuria and urgency.   Musculoskeletal: Positive for arthralgias, gait problem and joint swelling.       ORIF of the left patella.   Skin: Positive for wound. Negative for color change.       BLE discoloration. R parietal scalp hematoma.   Neurological: Positive for seizures. Negative for tremors, syncope, facial asymmetry, speech difficulty, weakness, light-headedness and headaches.       Memory lapses. Hx of seizures. RLS  Psychiatric/Behavioral: Positive for decreased concentration. Negative for confusion and sleep disturbance. The patient is not nervous/anxious.     Immunization History  Administered Date(s) Administered  . Influenza Split 01/06/2014, 01/15/2017, 01/08/2018, 12/09/2018  . Influenza Whole 01/07/2012, 01/06/2013  . Influenza, High Dose Seasonal PF 12/25/2015, 01/20/2017  . Influenza,inj,Quad PF,6+ Mos 12/21/2014  . Influenza-Unspecified 12/09/2018, 01/18/2020  . Moderna Sars-Covid-2 Vaccination 04/12/2019, 06/05/2019, 02/15/2020  . Pneumococcal Conjugate-13 02/01/2014  . Pneumococcal Polysaccharide-23 12/20/1992, 01/15/2000, 04/08/2004,  06/08/2004  . Td 04/08/2002, 04/22/2002  . Tdap 04/09/2011, 08/17/2015  . Zoster 04/08/2008, 05/29/2014, 05/28/2017  . Zoster Recombinat (Shingrix) 08/27/2017   Pertinent  Health Maintenance Due  Topic Date Due  . INFLUENZA VACCINE  11/06/2020  . DEXA SCAN  Completed  . PNA vac Low Risk Adult  Completed  . MAMMOGRAM  Discontinued   Fall Risk  03/24/2014 10/15/2012  Falls in the past year? No No   Functional Status Survey:    Vitals:   08/16/20 1413  BP: (!) 152/110  Pulse: (!) 110  Resp: 20  Temp: (!) 97.3 F (36.3 C)  SpO2: 92%  Weight: 150 lb (68 kg)  Height: $Remove'5\' 2"'mKmStqN$  (1.575 m)   Body mass index is 27.44 kg/m. Physical Exam Vitals and nursing note reviewed.  Constitutional:      Appearance: Normal appearance.  HENT:     Head: Normocephalic and atraumatic.     Mouth/Throat:     Mouth: Mucous membranes are moist.  Eyes:      Extraocular Movements: Extraocular movements intact.     Conjunctiva/sclera:     Right eye: Right conjunctiva is not injected.     Left eye: Left conjunctiva is not injected.     Pupils: Pupils are equal, round, and reactive to light.  Cardiovascular:     Rate and Rhythm: Normal rate and regular rhythm.     Heart sounds: Murmur heard.      Comments: PD pulses are not felt.  Pulmonary:     Breath sounds: Rales present.     Comments: Decreased air entry to both lungs. Bibasilar rales.  Abdominal:     General: Bowel sounds are normal.     Palpations: Abdomen is soft.     Tenderness: There is no abdominal tenderness.     Comments: Mid abd surgical scar  Musculoskeletal:     Cervical back: Normal range of motion and neck supple.     Right lower leg: Edema present.     Left lower leg: Edema present.     Comments:  Decreased overhead ROM of the left shoulder. Right elbow olecranon bursa effusion about a ping pong ball size. BLE edema trace  is chronic. Left knee s/p ORIF of the patella is in brace now  Skin:    General: Skin is warm and dry.     Findings: Bruising and erythema present.     Comments: Brownish skin discoloration BLE. Right parietal scalp hematoma is closed with steri strips, no active bleeding upon my examination.   Neurological:     General: No focal deficit present.     Mental Status: She is alert and oriented to person, place, and time. Mental status is at baseline.     Motor: No weakness.     Coordination: Coordination normal.     Gait: Gait abnormal.  Psychiatric:        Mood and Affect: Mood normal.        Behavior: Behavior normal.        Thought Content: Thought content normal.     Labs reviewed: Recent Labs    05/02/20 0630 05/26/20 0935 06/08/20 0000 06/13/20 0000 08/04/20 1205  NA 140 141 143 142 142  K 3.5 3.9 4.1 3.9 4.6  CL 104 104 105 108 104  CO2 25 26 31* 24* 27*  GLUCOSE 99 86  --   --   --   BUN 25* 27* 24* 22* 17  CREATININE 0.97 0.94  1.0 0.9  1.0  CALCIUM 9.2 9.5 9.4 9.6 10.0   Recent Labs    01/11/20 0000 06/08/20 0000  AST 16 16  ALT 11 9  ALKPHOS 109 138*  ALBUMIN 4.5 3.9   Recent Labs    01/11/20 0000 05/02/20 0630 05/26/20 0935 06/08/20 0000  WBC 5.1 6.3 5.7 5.5  NEUTROABS 2,953.00  --   --  3,515.00  HGB 14.5 13.6 14.7 14.2  HCT 44 42.6 46.5* 43  MCV  --  89.7 91.7  --   PLT 249 223 253 247   Lab Results  Component Value Date   TSH 1.79 06/29/2019   No results found for: HGBA1C Lab Results  Component Value Date   CHOL 164 10/12/2019   HDL 67 10/12/2019   LDLCALC 82 10/12/2019   TRIG 70 10/12/2019   CHOLHDL 3.6 06/27/2015    Significant Diagnostic Results in last 30 days:  No results found.  Assessment/Plan Scalp hematoma, initial encounter the right parietal hematoma, steri strips closure intact, no active bleeding sustained from a mechanical fall when the patient stood up, reach over for a soda drink. The patient said her gripper socks were not slip resistant. Denied dizziness, change of vision, headache, chest pain, palpitation, or focal weakness associated with falling.  PT to eval and tx for fall Observe for s/s of neurological symptoms.   Fall Mechanical fall, PT to eval/tx.   Nondisplaced transverse fracture of left patella, subsequent encounter for closed fracture with nonunion Fx of the left patella from a mechanical fall in the bathroom at [facility] 04/26/20.Resulted in aclosed nondisplaced transverse fracture left patella, f/u Orthos/pORIF. prn Oxycodone for pain. WBAT, in brace, working with therapy.    GERD (gastroesophageal reflux disease) ST eval significant reflux for several weeks results of mucus production,no observed.oropharyngeal dysphagia. TakesOmeprazole. Hgb 14.2 06/08/20, improved.  Restless legs syndrome (RLS) Controlled, takes MiraPex 0.$RemoveBeforeD'5mg'fXdzJZvvumiVNn$  hs   Senile dementia (HCC) No behavioral issues, on Memantine $RemoveBefo'10mg'DfvqzIudaEp$  bid.   Seizure disorder ([place]) stable, on  Keppra $Remov'500mg'jvzXeP$  qhs, $Rem'250mg'uGmO$  qd, Lamictal $RemoveBefo'150mg'TpVdIupAJfw$  bid.   Edema  trace, on Furosemide,Bun/creat17/0.99 eGFR 60 08/03/20  Gait disorder Uses walker all the time  Generalized osteoarthritis of multiple sites on Tylenol $RemoveBe'650mg'LVMcCntPs$  qd.  Olecranon bursitis of right elbow 01/19/20 Ortho limit use LUE as tolerated. R elbow olecranon bursitis, uses pad prn   Essential hypertension Blood pressure is controlled, continue Amlodipine.   COPD (chronic obstructive pulmonary disease) (HCC) takes Spiriva qd, prn Albuterol HFA  Slow transit constipation takes Senokot, MiraLax.  Senile osteoporosis stopped Foxamax per her request.       Family/ staff Communication: plan of care reviewed with the patient and charge nurse.   Labs/tests ordered:  none  Time spend 35 minutes.

## 2020-08-16 NOTE — Assessment & Plan Note (Signed)
the right parietal hematoma, steri strips closure intact, no active bleeding sustained from a mechanical fall when the patient stood up, reach over for a soda drink. The patient said her gripper socks were not slip resistant. Denied dizziness, change of vision, headache, chest pain, palpitation, or focal weakness associated with falling.  PT to eval and tx for fall Observe for s/s of neurological symptoms.

## 2020-08-16 NOTE — Assessment & Plan Note (Signed)
stopped Foxamax per her request.  

## 2020-08-16 NOTE — Assessment & Plan Note (Signed)
Uses walker all the time

## 2020-08-16 NOTE — Assessment & Plan Note (Signed)
No behavioral issues,  on Memantine 10mg bid. 

## 2020-08-22 ENCOUNTER — Encounter: Payer: Self-pay | Admitting: Internal Medicine

## 2020-08-22 ENCOUNTER — Non-Acute Institutional Stay (SKILLED_NURSING_FACILITY): Payer: Medicare PPO | Admitting: Internal Medicine

## 2020-08-22 DIAGNOSIS — G40909 Epilepsy, unspecified, not intractable, without status epilepticus: Secondary | ICD-10-CM | POA: Diagnosis not present

## 2020-08-22 DIAGNOSIS — Z9181 History of falling: Secondary | ICD-10-CM

## 2020-08-22 DIAGNOSIS — Z9889 Other specified postprocedural states: Secondary | ICD-10-CM

## 2020-08-22 DIAGNOSIS — Z85828 Personal history of other malignant neoplasm of skin: Secondary | ICD-10-CM | POA: Diagnosis not present

## 2020-08-22 DIAGNOSIS — I10 Essential (primary) hypertension: Secondary | ICD-10-CM | POA: Diagnosis not present

## 2020-08-22 DIAGNOSIS — L821 Other seborrheic keratosis: Secondary | ICD-10-CM | POA: Diagnosis not present

## 2020-08-22 DIAGNOSIS — R609 Edema, unspecified: Secondary | ICD-10-CM

## 2020-08-22 DIAGNOSIS — J410 Simple chronic bronchitis: Secondary | ICD-10-CM

## 2020-08-22 DIAGNOSIS — R413 Other amnesia: Secondary | ICD-10-CM | POA: Diagnosis not present

## 2020-08-22 DIAGNOSIS — G2581 Restless legs syndrome: Secondary | ICD-10-CM

## 2020-08-22 DIAGNOSIS — L218 Other seborrheic dermatitis: Secondary | ICD-10-CM | POA: Diagnosis not present

## 2020-08-22 NOTE — Progress Notes (Signed)
Location:   [facility] Room Number: 29 Place of Service:  SNF [phone]) Provider:  Veleta Miners MD  [name], MD  Patient Care Team: [name], MD as PCP - General (Internal Medicine) [name], MD as Consulting Physician (Ophthalmology) [name], MD as Consulting Physician (Cardiology) [name], MD as Consulting Physician (Dermatology) [name], MD as Consulting Physician (Orthopedic Surgery) [name], MD as Consulting Physician (Orthopedic Surgery) [name], MD as Consulting Physician (Orthopedic Surgery) [place], [place] Mast, Man X, NP as Nurse Practitioner (Nurse Practitioner) [name], MD as Consulting Physician (Neurology)  Extended Emergency Contact Information Primary Emergency Contact: [name],[name] Address: [address]          [place], [place] [address] [name] Phone: [phone] Relation: Sister Secondary Emergency Contact: [name] [place] Phone: [phone] Mobile Phone: [phone] Relation: Sister  Code Status:  DNR Managed Care Goals of care: Advanced Directive information Advanced Directives 08/22/2020  Does Patient Have a Medical Advance Directive? Yes  Type of Advance Directive Out of facility DNR (pink MOST or yellow form)  Does patient want to make changes to medical advance directive? No - Patient declined  Copy of [place] in Chart? -  Pre-existing out of facility DNR order (yellow form or pink MOST form) Yellow form placed in chart (order not valid for inpatient use);Pink MOST form placed in chart (order not valid for inpatient use)     Chief Complaint  Patient presents with  . Medical Management of Chronic Issues    HPI:  Pt is a 87 y.o. female seen today for medical management of chronic diseases.     Patientalsohas h/o COPD, Hypertension,Seizure Disorder,Mild  Cognitive impairment, Hyperlipidemia Chronic Venous Changes in LE Fall in 1/22  leading toLeft Humerus Fracture with Displacement  Sustained Left Patellar Fracture due to Mechanical fall Underwent ORIF on 1/25 Went for regular Follow up with Dr Percell Miller. Repeat Xray showed that it is was not healing well and she needed revision surgery on 2/22  Since then she has done well. Now Walking with her walker and Mild Assist Has Brace on her Leg. Pain seems Controlled Did have a fall few day sago with Hematoma. Her Leg slipped No Dizziness Had rash in her Face and saw dermatology today. Weight is stable Appetite is good     Past Medical History:  Diagnosis Date  . Acute bronchitis 05/23/2011  . Acute upper respiratory infections of unspecified site 05/23/2011  . Arthritis   . Chronic airway obstruction, not elsewhere classified 05/23/2011  . Disturbance of salivary secretion 01/31/2011  . Dizziness and giddiness 01/31/2011  . Dyspnea   . Essential tremor 04/25/2014  . External hemorrhoids without mention of complication [id number]/5462  . Gait disorder 04/25/2014  . Insomnia, unspecified 09/12/2011  . Lumbago 01/31/2011  . Major depressive disorder, single episode, unspecified 01/31/2011  . Memory disorder 04/25/2014  . Mitral valve disorders(424.0) 01/31/2011  . Other and unspecified hyperlipidemia 01/31/2011  . Other convulsions 01/31/2011  . Other emphysema ([place]) 01/31/2011  . Pain in joint, site unspecified 01/31/2011  . Restless legs syndrome (RLS) 09/12/2011  . Retinal detachment with retinal defect of right eye 2011   right eye twice  . Seizure disorder ([place])   . Senile osteoporosis 01/31/2011  . Spontaneous ecchymoses 01/31/2011  . Stiffness of joints, not elsewhere classified, multiple  sites 01/31/2011  . Unspecified essential hypertension 01/31/2011   Past Surgical History:  Procedure Laterality Date  . ABDOMINAL HYSTERECTOMY  06/21/2003   TAH/BSO, omenectomy PSB resect, Stg IC  cystadenofibroma  . CHOLECYSTECTOMY  2005   Dr. Marlou Starks  . ELBOW SURGERY Right 2008   broken   Dr. Apolonio Schneiders  . EYE SURGERY    . ORIF PATELLA Left 05/02/2020   Procedure: OPEN REDUCTION INTERNAL (ORIF) FIXATION LEFT PATELLA WITH MEDIAL AND LATERAL LIGAMENT REINFORCEMENTS;  Surgeon: [name], MD;  Location: [facility];  Service: Orthopedics;  Laterality: Left;  . ORIF PATELLA Left 05/30/2020   Procedure: OPEN REDUCTION INTERNAL (ORIF) FIXATION PATELLA;  Surgeon: [name], MD;  Location: [facility];  Service: Orthopedics;  Laterality: Left;  . RETINAL DETACHMENT SURGERY N/A    two  . REVERSE SHOULDER ARTHROPLASTY Left 05/06/2019   Procedure: REVERSE SHOULDER ARTHROPLASTY;  Surgeon: Justice Britain, MD;  Location: [facility];  Service: Orthopedics;  Laterality: Left;  161min  . ROTATOR CUFF REPAIR Right 2012   Dr. Theda Sers  . SQUAMOUS CELL CARCINOMA EXCISION Bilateral 2012, 8/14   [place] on legs   Dr. Sarajane Jews  . TONSILLECTOMY  1941  . VIDEO BRONCHOSCOPY WITH ENDOBRONCHIAL NAVIGATION N/A 11/29/2015   Procedure: VIDEO BRONCHOSCOPY WITH ENDOBRONCHIAL NAVIGATION;  Surgeon: Collene Gobble, MD;  Location: [facility];  Service: Thoracic;  Laterality: N/A;    Allergies  Allergen Reactions  . Dyazide [Hydrochlorothiazide W-Triamterene]     Other reaction(s): her blood pressure too much  . Latex Other (See Comments)    Swelling  . Other     Other reaction(s): local red reaction   . Rofecoxib     Other reaction(s): shortness of breath  . Sulfa Antibiotics Nausea And Vomiting  . Tetracycline Hcl     Other reaction(s): Cant take due to a drug interation    Allergies as of 08/22/2020      Reactions   Dyazide [hydrochlorothiazide W-triamterene]    Other reaction(s): her blood pressure too much   Latex Other (See Comments)   Swelling   Other    Other reaction(s): local red reaction   Rofecoxib    Other reaction(s): shortness of breath   Sulfa Antibiotics Nausea And Vomiting   Tetracycline Hcl     Other reaction(s): Cant take due to a drug interation      Medication List       Accurate as of Aug 22, 2020 11:11 AM. If you have any questions, ask your nurse or doctor.        acetaminophen 500 MG tablet Commonly known as: TYLENOL Take 2 tablets (1,000 mg total) by mouth every 6 (six) hours as needed.   amLODipine 2.5 MG tablet Commonly known as: NORVASC Take 2.5 mg by mouth daily.   aspirin 81 MG chewable tablet Chew 81 mg by mouth daily.   CeraVe Crea Apply 1 application topically daily. Mix cream with Triamcinolone   cholecalciferol 1000 units tablet Commonly known as: VITAMIN D Take 1 tablet (1,000 Units total) by mouth daily.   furosemide 20 MG tablet Commonly known as: LASIX Take 20 mg by mouth daily.   hydrocortisone 2.5 % lotion Apply 1 application topically daily as needed (psoriasis).   ketoconazole 2 % cream Commonly known as: NIZORAL Apply 1 application topically as needed (for psoriasis).   lamoTRIgine 150 MG tablet Commonly known as: LAMICTAL Take 150 mg by mouth 2 (two) times daily. What changed: Another medication with the same name was  removed. Continue taking this medication, and follow the directions you see here. Changed by: [name], MD   levETIRAcetam 500 MG tablet Commonly known as: KEPPRA Take 500 mg by mouth at bedtime.   levETIRAcetam 250 MG tablet Commonly known as: KEPPRA Take 250 mg by mouth daily. In the morning   memantine 10 MG tablet Commonly known as: NAMENDA TAKE 1 TABLET BY MOUTH TWICE DAILY.   nystatin powder Commonly known as: MYCOSTATIN/NYSTOP Apply 1 application topically 2 (two) times daily as needed (tinea cruris).   oxyCODONE 5 MG immediate release tablet Commonly known as: Oxy IR/ROXICODONE Take 1 tablet (5 mg total) by mouth every 6 (six) hours as needed for severe pain.   pantoprazole 40 MG tablet Commonly known as: PROTONIX Take 40 mg by mouth daily.   polyethylene glycol 17 g packet Commonly  known as: MIRALAX / GLYCOLAX Take 17 g by mouth daily.   Potassium Chloride ER 20 MEQ Tbcr Take 20 mEq by mouth daily.   pramipexole 0.25 MG tablet Commonly known as: MIRAPEX Take 0.5 mg by mouth at bedtime.   sennosides-docusate sodium 8.6-50 MG tablet Commonly known as: SENOKOT-S Take 2 tablets by mouth at bedtime.   simvastatin 10 MG tablet Commonly known as: ZOCOR Take 10 mg by mouth at bedtime.   tiotropium 18 MCG inhalation capsule Commonly known as: Spiriva HandiHaler INHALE CONTENTS OF ONE CAPSULE ONCE DAILY FOR COPD.   triamcinolone cream 0.1 % Commonly known as: KENALOG Apply 1 application topically daily. Mix with cerave       Review of Systems  Review of Systems  Constitutional: Negative for activity change, appetite change, chills, diaphoresis, fatigue and fever.  HENT: Negative for mouth sores, postnasal drip, rhinorrhea, sinus pain and sore throat.   Respiratory: Negative for apnea, cough, chest tightness, shortness of breath and wheezing.   Cardiovascular: Negative for chest pain, palpitations and leg swelling.  Gastrointestinal: Negative for abdominal distention, abdominal pain, constipation, diarrhea, nausea and vomiting.  Genitourinary: Negative for dysuria and frequency.  Musculoskeletal: Negative for arthralgias, joint swelling and myalgias.  Skin: Negative for rash.  Neurological: Negative for dizziness, syncope, weakness, light-headedness and numbness.  Psychiatric/Behavioral: Negative for behavioral problems, confusion and sleep disturbance.     Immunization History  Administered Date(s) Administered  . Influenza Split 01/06/2014, 01/15/2017, 01/08/2018, 12/09/2018  . Influenza Whole 01/07/2012, 01/06/2013  . Influenza, High Dose Seasonal PF 12/25/2015, 01/20/2017  . Influenza,inj,Quad PF,6+ Mos 12/21/2014  . Influenza-Unspecified 12/09/2018, 01/18/2020  . Moderna Sars-Covid-2 Vaccination 04/12/2019, 06/05/2019, 02/15/2020  . Pneumococcal  Conjugate-13 02/01/2014  . Pneumococcal Polysaccharide-23 12/20/1992, 01/15/2000, 04/08/2004, 06/08/2004  . Td 04/08/2002, 04/22/2002  . Tdap 04/09/2011, 08/17/2015  . Zoster 04/08/2008, 05/29/2014, 05/28/2017  . Zoster Recombinat (Shingrix) 08/27/2017   Pertinent  Health Maintenance Due  Topic Date Due  . INFLUENZA VACCINE  11/06/2020  . DEXA SCAN  Completed  . PNA vac Low Risk Adult  Completed  . MAMMOGRAM  Discontinued   Fall Risk  03/24/2014 10/15/2012  Falls in the past year? No No   Functional Status Survey:    Vitals:   08/22/20 1045  BP: 138/68  Pulse: 95  Resp: 18  Temp: (!) 96.6 F (35.9 C)  SpO2: 94%  Weight: 150 lb (68 kg)  Height: 5\' 2"  (1.575 m)   Body mass index is 27.44 kg/m. Physical Exam Constitutional: Oriented to person, place, and time. Well-developed and well-nourished.  HENT:  Head: Normocephalic.  Mouth/Throat: Oropharynx is clear and moist.  Eyes: Pupils  are equal, round, and reactive to light.  Neck: Neck supple.  Cardiovascular: Normal rate and normal heart sounds.  No murmur heard. Pulmonary/Chest: Effort normal and breath sounds normal. No respiratory distress. No wheezes. She has no rales.  Abdominal: Soft. Bowel sounds are normal. No distension. There is no tenderness. There is no rebound.  Musculoskeletal: Left Knee in Brace now Has Chronic Venous Changes Bilateral Lymphadenopathy: none Neurological: Alert and oriented to person, place, and time.  Skin: Skin is warm and dry.  Psychiatric: Normal mood and affect. Behavior is normal. Thought content normal.   Labs reviewed: Recent Labs    05/02/20 0630 05/26/20 0935 06/08/20 0000 06/13/20 0000 08/04/20 1205  NA 140 141 143 142 142  K 3.5 3.9 4.1 3.9 4.6  CL 104 104 105 108 104  CO2 25 26 31* 24* 27*  GLUCOSE 99 86  --   --   --   BUN 25* 27* 24* 22* 17  CREATININE 0.97 0.94 1.0 0.9 1.0  CALCIUM 9.2 9.5 9.4 9.6 10.0   Recent Labs    01/11/20 0000 06/08/20 0000  AST 16  16  ALT 11 9  ALKPHOS 109 138*  ALBUMIN 4.5 3.9   Recent Labs    01/11/20 0000 05/02/20 0630 05/26/20 0935 06/08/20 0000  WBC 5.1 6.3 5.7 5.5  NEUTROABS 2,953.00  --   --  3,515.00  HGB 14.5 13.6 14.7 14.2  HCT 44 42.6 46.5* 43  MCV  --  89.7 91.7  --   PLT 249 223 253 247   Lab Results  Component Value Date   TSH 1.79 06/29/2019   No results found for: HGBA1C Lab Results  Component Value Date   CHOL 164 10/12/2019   HDL 67 10/12/2019   LDLCALC 82 10/12/2019   TRIG 70 10/12/2019   CHOLHDL 3.6 06/27/2015    Significant Diagnostic Results in last 30 days:  No results found.  Assessment/Plan Seizure disorder (HCC) Stable on Keppra and Lamictal  Edema, unspecified type Low dose of Lasix Bun and Creat stable Essential hypertension On Low dose of Norvasc  s/p ORIF of Patella Doing well. WBAT Pain Controlled Working with therapy Simple chronic bronchitis ([place]) On Spiriva Memory disorder Very High Functional On NAmenda Restless legs syndrome (RLS) On Mirapex HLD On Statin Needs Lipid panel High Risk for Falls Working with therapy Has decided to stay in SNF for now  Family/ staff Communication:   Labs/tests ordered:

## 2020-09-06 DIAGNOSIS — S82002D Unspecified fracture of left patella, subsequent encounter for closed fracture with routine healing: Secondary | ICD-10-CM | POA: Diagnosis not present

## 2020-09-11 ENCOUNTER — Other Ambulatory Visit: Payer: Self-pay | Admitting: *Deleted

## 2020-09-11 MED ORDER — OXYCODONE HCL 5 MG PO TABS: 5.0000 mg | ORAL_TABLET | Freq: Four times a day (QID) | ORAL | 0 refills | Status: DC | PRN

## 2020-09-11 NOTE — Telephone Encounter (Signed)
Received refill Request from [facility] and sent to [facility] for approval.

## 2020-09-15 ENCOUNTER — Encounter: Payer: Self-pay | Admitting: Nurse Practitioner

## 2020-09-15 ENCOUNTER — Non-Acute Institutional Stay (SKILLED_NURSING_FACILITY): Payer: Medicare PPO | Admitting: Nurse Practitioner

## 2020-09-15 DIAGNOSIS — G2581 Restless legs syndrome: Secondary | ICD-10-CM

## 2020-09-15 DIAGNOSIS — I10 Essential (primary) hypertension: Secondary | ICD-10-CM | POA: Diagnosis not present

## 2020-09-15 DIAGNOSIS — J431 Panlobular emphysema: Secondary | ICD-10-CM | POA: Diagnosis not present

## 2020-09-15 DIAGNOSIS — M159 Polyosteoarthritis, unspecified: Secondary | ICD-10-CM | POA: Diagnosis not present

## 2020-09-15 DIAGNOSIS — K219 Gastro-esophageal reflux disease without esophagitis: Secondary | ICD-10-CM | POA: Diagnosis not present

## 2020-09-15 DIAGNOSIS — S82035K Nondisplaced transverse fracture of left patella, subsequent encounter for closed fracture with nonunion: Secondary | ICD-10-CM

## 2020-09-15 DIAGNOSIS — G40909 Epilepsy, unspecified, not intractable, without status epilepticus: Secondary | ICD-10-CM | POA: Diagnosis not present

## 2020-09-15 DIAGNOSIS — R609 Edema, unspecified: Secondary | ICD-10-CM | POA: Diagnosis not present

## 2020-09-15 DIAGNOSIS — F0390 Unspecified dementia without behavioral disturbance: Secondary | ICD-10-CM

## 2020-09-15 DIAGNOSIS — K5901 Slow transit constipation: Secondary | ICD-10-CM

## 2020-09-15 NOTE — Assessment & Plan Note (Signed)
ST eval significant reflux for several weeks results of mucus production, no observed. oropharyngeal dysphagia. Takes  Omeprazole. Hgb 14.2 06/08/20, improved.

## 2020-09-15 NOTE — Progress Notes (Signed)
Location:   [name] Room Number: [place] of Service:  SNF (31) Provider:  Meliss Fleek Otho Darner, NP    Patient Care Team: Martha Dad, MD as PCP - General (Internal Medicine) Martha Jacks, MD as Consulting Physician (Ophthalmology) Martha Pain, MD as Consulting Physician (Cardiology) Martha Bookbinder, MD as Consulting Physician (Dermatology) Martha Maudlin, MD as Consulting Physician (Orthopedic Surgery) Martha Cabal, MD as Consulting Physician (Orthopedic Surgery) Martha Planas, MD as Consulting Physician (Orthopedic Surgery) [name], [name], [name] X, NP as Nurse Practitioner (Nurse Practitioner) Martha Ducking, MD as Consulting Physician (Neurology)  Extended Emergency Contact Information Primary Emergency Contact: [name],[name] Address: [address]          [place], [place] [address] [name] of [place] Phone: [phone] Relation: Sister Secondary Emergency Contact: [name] [place] of [place] Phone: [phone] Mobile Phone: [phone] Relation: Sister  Code Status:  DNR Goals of care: Advanced Directive information Advanced Directives 09/15/2020  Does Patient Have a Medical Advance Directive? Yes  Type of Advance Directive Out of facility DNR (pink MOST or yellow form)  Does patient want to make changes to medical advance directive? No - Patient declined  Copy of [place] in Chart? -  Pre-existing out of facility DNR order (yellow form or pink MOST form) Yellow form placed in chart (order not valid for inpatient use);Pink MOST form placed in chart (order not valid for inpatient use)     Chief Complaint  Patient presents with   Medical Management of Chronic Issues    Routine follow up.    Health Maintenance    Discuss need for shingles vaccine.     HPI:  Pt is a 87 y.o. female seen today for medical management of chronic diseases.    Fx of the  left patella from a mechanical fall in the bathroom at [facility] 04/26/20. Resulted in a closed nondisplaced transverse fracture left patella, f/u Ortho s/p  ORIF.  prn Oxycodone for Bentley. WBAT, in brace, working with therapy.              GERD ST eval significant reflux for several weeks results of mucus production, no observed. oropharyngeal dysphagia. Takes  Omeprazole. Hgb 14.2 06/08/20, improved.              Restless leg symptom, takes MiraPex 0.$RemoveBeforeD'5mg'rgoEQDJcLnGSJI$  hs             Hx of dementia,  on Memantine $RemoveBefo'10mg'BoLTxEjEvKc$  bid.             Hx of seizures, stable, on Keppra $RemoveB'500mg'zbfJRROb$  qhs, $Rem'250mg'SFbp$  qd, Lamictal $RemoveBefo'150mg'pmdPtmwZLsf$  bid.             Edema, trace, on Furosemide,  Bun/creat 17/0.99 eGFR 60 08/03/20             OA in general, on Tylenol $RemoveBe'650mg'DZQLIncwv$  qd.              HTN, takes Amlodipine.             COPD, takes Spiriva qd, prn Albuterol HFA             Constipation, takes Senokot, MiraLax.              OP, stopped Foxamax per her request.    Past Medical History:  Diagnosis Date   Acute bronchitis 05/23/2011   Acute upper respiratory infections of unspecified  site 05/23/2011   Arthritis    Chronic airway obstruction, not elsewhere classified 05/23/2011   Disturbance of salivary secretion 01/31/2011   Dizziness and giddiness 01/31/2011   Dyspnea    Essential tremor 04/25/2014   External hemorrhoids without mention of complication 40/97/3532   Gait disorder 04/25/2014   Insomnia, unspecified 09/12/2011   Lumbago 01/31/2011   Major depressive disorder, single episode, unspecified 01/31/2011   Memory disorder 04/25/2014   Mitral valve disorders(424.0) 01/31/2011   Other and unspecified hyperlipidemia 01/31/2011   Other convulsions 01/31/2011   Other emphysema ([place]) 01/31/2011   Bentley in joint, site unspecified 01/31/2011   Restless legs syndrome (RLS) 09/12/2011   Retinal detachment with retinal defect of right eye 2011   right eye twice   Seizure disorder ([place])    Senile osteoporosis 01/31/2011   Spontaneous ecchymoses 01/31/2011    Stiffness of joints, not elsewhere classified, multiple sites 01/31/2011   Unspecified essential hypertension 01/31/2011   Past Surgical History:  Procedure Laterality Date   ABDOMINAL HYSTERECTOMY  06/21/2003   TAH/BSO, omenectomy PSB resect, Stg IC cystadenofibroma   CHOLECYSTECTOMY  2005   Dr. Marlou Starks   ELBOW SURGERY Right 2008   broken   Dr. Apolonio Schneiders   EYE SURGERY     ORIF PATELLA Left 05/02/2020   Procedure: OPEN REDUCTION INTERNAL (ORIF) FIXATION LEFT PATELLA WITH MEDIAL AND LATERAL LIGAMENT REINFORCEMENTS;  Surgeon: [name], MD;  Location: [facility];  Service: Orthopedics;  Laterality: Left;   ORIF PATELLA Left 05/30/2020   Procedure: OPEN REDUCTION INTERNAL (ORIF) FIXATION PATELLA;  Surgeon: [name], MD;  Location: [facility];  Service: Orthopedics;  Laterality: Left;   RETINAL DETACHMENT SURGERY N/A    two   REVERSE SHOULDER ARTHROPLASTY Left 05/06/2019   Procedure: REVERSE SHOULDER ARTHROPLASTY;  Surgeon: Justice Britain, MD;  Location: [facility];  Service: Orthopedics;  Laterality: Left;  167min   ROTATOR CUFF REPAIR Right 2012   Dr. Theda Sers   SQUAMOUS CELL CARCINOMA EXCISION Bilateral 2012, 8/14   [place] on legs   Dr. Sarajane Jews   TONSILLECTOMY  1941   VIDEO BRONCHOSCOPY WITH ENDOBRONCHIAL NAVIGATION N/A 11/29/2015   Procedure: VIDEO BRONCHOSCOPY WITH ENDOBRONCHIAL NAVIGATION;  Surgeon: Collene Gobble, MD;  Location: [facility];  Service: Thoracic;  Laterality: N/A;    Allergies  Allergen Reactions   Dyazide [Hydrochlorothiazide W-Triamterene]     Other reaction(s): her blood pressure too much   Latex Other (See Comments)    Swelling   Other     Other reaction(s): local red reaction    Rofecoxib     Other reaction(s): shortness of breath   Sulfa Antibiotics Nausea And Vomiting   Tetracycline Hcl     Other reaction(s): Cant take due to a drug interation    Allergies as of 09/15/2020       Reactions   Dyazide [hydrochlorothiazide W-triamterene]    Other reaction(s):  her blood pressure too much   Latex Other (See Comments)   Swelling   Other    Other reaction(s): local red reaction   Rofecoxib    Other reaction(s): shortness of breath   Sulfa Antibiotics Nausea And Vomiting   Tetracycline Hcl    Other reaction(s): Cant take due to a drug interation        Medication List        Accurate as of September 15, 2020 11:59 PM. If you have any questions, ask your nurse or doctor.  acetaminophen 500 MG tablet Commonly known as: TYLENOL Take 2 tablets (1,000 mg total) by mouth every 6 (six) hours as needed.   amLODipine 2.5 MG tablet Commonly known as: NORVASC Take 2.5 mg by mouth daily.   aspirin 81 MG chewable tablet Chew 81 mg by mouth daily.   CeraVe Crea Apply 1 application topically daily. Mix cream with Triamcinolone   cholecalciferol 1000 units tablet Commonly known as: VITAMIN D Take 1 tablet (1,000 Units total) by mouth daily.   furosemide 20 MG tablet Commonly known as: LASIX Take 20 mg by mouth daily.   hydrocortisone 2.5 % lotion Apply 1 application topically daily as needed (psoriasis).   ketoconazole 2 % cream Commonly known as: NIZORAL Apply 1 application topically as needed (for psoriasis).   lamoTRIgine 150 MG tablet Commonly known as: LAMICTAL Take 150 mg by mouth 2 (two) times daily.   levETIRAcetam 500 MG tablet Commonly known as: KEPPRA Take 500 mg by mouth at bedtime.   levETIRAcetam 250 MG tablet Commonly known as: KEPPRA Take 250 mg by mouth daily. In the morning   memantine 10 MG tablet Commonly known as: NAMENDA TAKE 1 TABLET BY MOUTH TWICE DAILY.   nystatin powder Commonly known as: MYCOSTATIN/NYSTOP Apply 1 application topically 2 (two) times daily as needed (tinea cruris).   oxyCODONE 5 MG immediate release tablet Commonly known as: Oxy IR/ROXICODONE Take 1 tablet (5 mg total) by mouth every 6 (six) hours as needed for severe Bentley.   pantoprazole 40 MG tablet Commonly known as:  PROTONIX Take 40 mg by mouth daily.   polyethylene glycol 17 g packet Commonly known as: MIRALAX / GLYCOLAX Take 17 g by mouth daily.   Potassium Chloride ER 20 MEQ Tbcr Take 20 mEq by mouth daily.   pramipexole 0.25 MG tablet Commonly known as: MIRAPEX Take 0.5 mg by mouth at bedtime.   sennosides-docusate sodium 8.6-50 MG tablet Commonly known as: SENOKOT-S Take 2 tablets by mouth at bedtime.   simvastatin 10 MG tablet Commonly known as: ZOCOR Take 10 mg by mouth at bedtime.   tiotropium 18 MCG inhalation capsule Commonly known as: Spiriva HandiHaler INHALE CONTENTS OF ONE CAPSULE ONCE DAILY FOR COPD.   triamcinolone cream 0.1 % Commonly known as: KENALOG Apply 1 application topically daily. Mix with cerave        Review of Systems  Constitutional:  Negative for activity change, appetite change and fever.  HENT:  Positive for hearing loss. Negative for congestion and voice change.   Eyes:  Negative for visual disturbance.  Respiratory:  Positive for shortness of breath. Negative for cough and wheezing.        DOE is chronic  Cardiovascular:  Positive for leg swelling.  Gastrointestinal:  Negative for abdominal Bentley and constipation.       Acid reflux symptoms.   Genitourinary:  Negative for dysuria and urgency.  Musculoskeletal:  Positive for arthralgias, gait problem and joint swelling.       ORIF of the left patella.   Skin:  Negative for color change.       BLE discoloration.   Neurological:  Positive for seizures. Negative for speech difficulty, weakness and headaches.       Memory lapses. Hx of seizures. RLS  Psychiatric/Behavioral:  Negative for confusion and sleep disturbance. The patient is not nervous/anxious.    Immunization History  Administered Date(s) Administered   Influenza Split 01/06/2014, 01/15/2017, 01/08/2018, 12/09/2018   Influenza Whole 01/07/2012, 01/06/2013   Influenza, High Dose  Seasonal PF 12/25/2015, 01/20/2017   Influenza,inj,Quad  PF,6+ Mos 12/21/2014   Influenza-Unspecified 12/09/2018, 01/18/2020   Moderna Sars-Covid-2 Vaccination 04/12/2019, 06/05/2019, 02/15/2020, 09/05/2020   Pneumococcal Conjugate-13 02/01/2014   Pneumococcal Polysaccharide-23 12/20/1992, 01/15/2000, 04/08/2004, 06/08/2004   Td 04/08/2002, 04/22/2002   Tdap 04/09/2011, 08/17/2015   Zoster Recombinat (Shingrix) 05/29/2005, 08/27/2017   Zoster, Live 04/08/2008, 05/29/2014, 05/28/2017   Zoster, Unspecified 05/29/2005   Pertinent  Health Maintenance Due  Topic Date Due   INFLUENZA VACCINE  11/06/2020   DEXA SCAN  Completed   PNA vac Low Risk Adult  Completed   MAMMOGRAM  Discontinued   Fall Risk  03/24/2014 10/15/2012  Falls in the past year? No No   Functional Status Survey:    Vitals:   09/15/20 0917  BP: 122/72  Pulse: 76  Resp: 18  Temp: (!) 97.2 F (36.2 C)  SpO2: 95%  Weight: 152 lb (68.9 kg)  Height: $Remove'5\' 2"'SWcCYBx$  (1.575 m)   Body mass index is 27.8 kg/m. Physical Exam Vitals and nursing note reviewed.  Constitutional:      Appearance: Normal appearance.  HENT:     Head: Normocephalic and atraumatic.     Mouth/Throat:     Mouth: Mucous membranes are moist.  Eyes:     Extraocular Movements: Extraocular movements intact.     Conjunctiva/sclera:     Right eye: Right conjunctiva is not injected.     Left eye: Left conjunctiva is not injected.     Pupils: Pupils are equal, round, and reactive to light.  Cardiovascular:     Rate and Rhythm: Normal rate and regular rhythm.     Heart sounds: Murmur heard.     Comments: PD pulses are not felt.  Pulmonary:     Breath sounds: Rales present.     Comments: Decreased air entry to both lungs. Bibasilar rales.  Abdominal:     General: Bowel sounds are normal.     Palpations: Abdomen is soft.     Tenderness: There is no abdominal tenderness.     Comments: Mid abd surgical scar  Musculoskeletal:     Cervical back: Normal range of motion and neck supple.     Right lower leg: Edema  present.     Left lower leg: Edema present.     Comments:  Decreased overhead ROM of the left shoulder. BLE edema trace  is chronic. Left knee s/p ORIF of the patella is in brace now  Skin:    General: Skin is warm and dry.     Comments: Brownish skin discoloration BLE.   Neurological:     General: No focal deficit present.     Mental Status: She is alert and oriented to person, place, and time. Mental status is at baseline.     Gait: Gait abnormal.  Psychiatric:        Mood and Affect: Mood normal.        Behavior: Behavior normal.        Thought Content: Thought content normal.    Labs reviewed: Recent Labs    05/02/20 0630 05/26/20 0935 06/08/20 0000 06/13/20 0000 08/04/20 1205  NA 140 141 143 142 142  K 3.5 3.9 4.1 3.9 4.6  CL 104 104 105 108 104  CO2 25 26 31* 24* 27*  GLUCOSE 99 86  --   --   --   BUN 25* 27* 24* 22* 17  CREATININE 0.97 0.94 1.0 0.9 1.0  CALCIUM 9.2 9.5 9.4 9.6 10.0  Recent Labs    01/11/20 0000 06/08/20 0000  AST 16 16  ALT 11 9  ALKPHOS 109 138*  ALBUMIN 4.5 3.9   Recent Labs    01/11/20 0000 05/02/20 0630 05/26/20 0935 06/08/20 0000  WBC 5.1 6.3 5.7 5.5  NEUTROABS 2,953.00  --   --  3,515.00  HGB 14.5 13.6 14.7 14.2  HCT 44 42.6 46.5* 43  MCV  --  89.7 91.7  --   PLT 249 223 253 247   Lab Results  Component Value Date   TSH 1.79 06/29/2019   No results found for: HGBA1C Lab Results  Component Value Date   CHOL 164 10/12/2019   HDL 67 10/12/2019   LDLCALC 82 10/12/2019   TRIG 70 10/12/2019   CHOLHDL 3.6 06/27/2015    Significant Diagnostic Results in last 30 days:  No results found.  Assessment/Plan GERD (gastroesophageal reflux disease) ST eval significant reflux for several weeks results of mucus production, no observed. oropharyngeal dysphagia. Takes  Omeprazole. Hgb 14.2 06/08/20, improved.   Nondisplaced transverse fracture of left patella, subsequent encounter for closed fracture with nonunion Fx of the left  patella from a mechanical fall in the bathroom at [facility] 04/26/20. Resulted in a closed nondisplaced transverse fracture left patella, f/u Ortho s/p  ORIF.  prn Oxycodone for Bentley. WBAT, in brace, working with therapy.   Restless legs syndrome (RLS) takes MiraPex 0.$RemoveBeforeD'5mg'RErQwQULewArFC$  hs  Senile dementia (HCC) No behavioral issues, on Memantine $RemoveBefo'10mg'nhxSMolkHqL$  bid.  Seizure disorder ([place]) stable, on Keppra $RemoveB'500mg'YpBlkQhw$  qhs, $Rem'250mg'zQDS$  qd, Lamictal $RemoveBefo'150mg'uqSMyNRNaUC$  bid.  Edema trace, on Furosemide,  Bun/creat 17/0.99 eGFR 60 08/03/20  Generalized osteoarthritis of multiple sites on Tylenol $RemoveBe'650mg'VPsCrXZxP$  qd.   Essential hypertension Blood pressure is controlled, continue Amlodipine.   COPD (chronic obstructive pulmonary disease) (HCC) Stable, takes Spiriva qd, prn Albuterol HFA  Slow transit constipation Stable, takes Senokot, MiraLax.     Family/ staff Communication: plan of care reviewed with the patient and charge nurse.   Labs/tests ordered:  none  Time spend 35 minutes.

## 2020-09-18 NOTE — Assessment & Plan Note (Signed)
Fx of the left patella from a mechanical fall in the bathroom at [facility] 04/26/20. Resulted in a closed nondisplaced transverse fracture left patella, f/u Ortho s/p  ORIF.  prn Oxycodone for pain. WBAT, in brace, working with therapy.

## 2020-09-18 NOTE — Assessment & Plan Note (Signed)
on Tylenol 650mg  qd.

## 2020-09-18 NOTE — Assessment & Plan Note (Signed)
No behavioral issues, on Memantine 10mg  bid.

## 2020-09-18 NOTE — Assessment & Plan Note (Signed)
Stable, takes Spiriva qd, prn Albuterol HFA

## 2020-09-18 NOTE — Assessment & Plan Note (Signed)
Blood pressure is controlled, continue Amlodipine.  

## 2020-09-18 NOTE — Assessment & Plan Note (Signed)
stable, on Keppra 500mg  qhs, 250mg  qd, Lamictal 150mg  bid.

## 2020-09-18 NOTE — Assessment & Plan Note (Signed)
takes MiraPex 0.5mg  hs

## 2020-09-18 NOTE — Assessment & Plan Note (Signed)
Stable, takes Senokot, MiraLax.

## 2020-09-18 NOTE — Assessment & Plan Note (Signed)
trace, on Furosemide,  Bun/creat 17/0.99 eGFR 60 08/03/20 

## 2020-09-19 DIAGNOSIS — L602 Onychogryphosis: Secondary | ICD-10-CM | POA: Diagnosis not present

## 2020-09-19 DIAGNOSIS — M79671 Pain in right foot: Secondary | ICD-10-CM | POA: Diagnosis not present

## 2020-09-19 DIAGNOSIS — M79672 Pain in left foot: Secondary | ICD-10-CM | POA: Diagnosis not present

## 2020-09-19 DIAGNOSIS — L84 Corns and callosities: Secondary | ICD-10-CM | POA: Diagnosis not present

## 2020-09-25 ENCOUNTER — Encounter: Payer: Self-pay | Admitting: Nurse Practitioner

## 2020-09-25 ENCOUNTER — Non-Acute Institutional Stay (SKILLED_NURSING_FACILITY): Payer: Medicare PPO | Admitting: Nurse Practitioner

## 2020-09-25 DIAGNOSIS — R609 Edema, unspecified: Secondary | ICD-10-CM | POA: Diagnosis not present

## 2020-09-25 DIAGNOSIS — S82035K Nondisplaced transverse fracture of left patella, subsequent encounter for closed fracture with nonunion: Secondary | ICD-10-CM | POA: Diagnosis not present

## 2020-09-25 DIAGNOSIS — F0390 Unspecified dementia without behavioral disturbance: Secondary | ICD-10-CM | POA: Diagnosis not present

## 2020-09-25 DIAGNOSIS — G40909 Epilepsy, unspecified, not intractable, without status epilepticus: Secondary | ICD-10-CM | POA: Diagnosis not present

## 2020-09-25 DIAGNOSIS — I10 Essential (primary) hypertension: Secondary | ICD-10-CM

## 2020-09-25 DIAGNOSIS — J431 Panlobular emphysema: Secondary | ICD-10-CM

## 2020-09-25 DIAGNOSIS — K5901 Slow transit constipation: Secondary | ICD-10-CM

## 2020-09-25 DIAGNOSIS — U071 COVID-19: Secondary | ICD-10-CM | POA: Diagnosis not present

## 2020-09-25 DIAGNOSIS — G2581 Restless legs syndrome: Secondary | ICD-10-CM | POA: Diagnosis not present

## 2020-09-25 DIAGNOSIS — K219 Gastro-esophageal reflux disease without esophagitis: Secondary | ICD-10-CM | POA: Diagnosis not present

## 2020-09-25 NOTE — Assessment & Plan Note (Signed)
race, on Furosemide,  Bun/creat 17/0.99 eGFR 60 08/03/20

## 2020-09-25 NOTE — Assessment & Plan Note (Signed)
nasal congestion, cough, and fatigue x 4 days. She was placed on Vit C, Zinc, Mucinex. The patient is afebrile, no s/o chest pain, SOB, her appetite and oral intakes is at her baseline. No O2 desaturation. The patient desired Paxlovid x 5 days.

## 2020-09-25 NOTE — Assessment & Plan Note (Signed)
ST eval significant reflux for several weeks results of mucus production, no observed. oropharyngeal dysphagia. Takes  Omeprazole. Hgb 14.2 06/08/20, improved.

## 2020-09-25 NOTE — Assessment & Plan Note (Signed)
Fx of the left patella from a mechanical fall in the bathroom at [facility] 04/26/20. Resulted in a closed nondisplaced transverse fracture left patella, f/u Ortho s/p  ORIF.  prn Oxycodone for pain. WBAT, in brace, working with therapy.

## 2020-09-25 NOTE — Assessment & Plan Note (Signed)
Blood pressure is controlled, continue Amlodipine.  

## 2020-09-25 NOTE — Progress Notes (Signed)
Location:   [place] Room Number: [place] of Service:  SNF (31) Provider:  Zarya Lasseigne Otho Darner, NP    Patient Care Team: [name], MD as PCP - General (Internal Medicine) [name], MD as Consulting Physician (Ophthalmology) [name], MD as Consulting Physician (Cardiology) [name], MD as Consulting Physician (Dermatology) [name], MD as Consulting Physician (Orthopedic Surgery) [name], MD as Consulting Physician (Orthopedic Surgery) [name], MD as Consulting Physician (Orthopedic Surgery) [place], [place], Bora Broner X, NP as Nurse Practitioner (Nurse Practitioner) [name], MD as Consulting Physician (Neurology)  Extended Emergency Contact Information Primary Emergency Contact: [name],[name] Address: [address]          [place], [place] [address] [name] of [place] Phone: [phone] Relation: Sister Secondary Emergency Contact: [name] [place] of [place] Phone: [phone] Mobile Phone: [phone] Relation: Sister  Code Status:  DNR Goals of care: Advanced Directive information Advanced Directives 09/25/2020  Does Patient Have a Medical Advance Directive? Yes  Type of Advance Directive Out of facility DNR (pink MOST or yellow form)  Does patient want to make changes to medical advance directive? No - Patient declined  Copy of [place] in Chart? -  Pre-existing out of facility DNR order (yellow form or pink MOST form) Yellow form placed in chart (order not valid for inpatient use);Pink MOST form placed in chart (order not valid for inpatient use)     Chief Complaint  Patient presents with   Acute Visit    Patient presents with positive covid test.     HPI:  Pt is a 87 y.o. female seen today for an acute visit for COVID infection, nasal congestion, cough, and fatigue x 4 days. She was placed on Vit C, Zinc,  Mucinex. The patient is afebrile, no s/o chest pain, SOB, her appetite and oral intakes is at her baseline. No O2 desaturation.   Fx of the left patella from a mechanical fall in the bathroom at [facility] 04/26/20. Resulted in a closed nondisplaced transverse fracture left patella, f/u Ortho s/p  ORIF.  prn Oxycodone for pain. WBAT, in brace, working with therapy.              GERD ST eval significant reflux for several weeks results of mucus production, no observed. oropharyngeal dysphagia. Takes  Omeprazole. Hgb 14.2 06/08/20, improved.              Restless leg symptom, takes MiraPex 0.$RemoveBeforeD'5mg'GNdKNzqlCpsMXR$  hs             Hx of dementia,  on Memantine $RemoveBefo'10mg'EBMBJOPgLdU$  bid.             Hx of seizures, stable, on Keppra $RemoveB'500mg'XEOlDsoZ$  qhs, $Rem'250mg'KzCr$  qd, Lamictal $RemoveBefo'150mg'LOVNuzXcIKi$  bid.             Edema, trace, on Furosemide,  Bun/creat 17/0.99 eGFR 60 08/03/20             OA in general, on Tylenol $RemoveBe'650mg'atBmublFJ$  qd.              HTN, takes Amlodipine.             COPD, takes Spiriva qd, prn Albuterol HFA             Constipation, takes Senokot, MiraLax.              OP, stopped Foxamax per her  request.      Past Medical History:  Diagnosis Date   Acute bronchitis 05/23/2011   Acute upper respiratory infections of unspecified site 05/23/2011   Arthritis    Chronic airway obstruction, not elsewhere classified 05/23/2011   Disturbance of salivary secretion 01/31/2011   Dizziness and giddiness 01/31/2011   Dyspnea    Essential tremor 04/25/2014   External hemorrhoids without mention of complication 91/91/6606   Gait disorder 04/25/2014   Insomnia, unspecified 09/12/2011   Lumbago 01/31/2011   Major depressive disorder, single episode, unspecified 01/31/2011   Memory disorder 04/25/2014   Mitral valve disorders(424.0) 01/31/2011   Other and unspecified hyperlipidemia 01/31/2011   Other convulsions 01/31/2011   Other emphysema ([place]) 01/31/2011   Pain in joint, site unspecified 01/31/2011   Restless legs syndrome (RLS) 09/12/2011   Retinal detachment with retinal  defect of right eye 2011   right eye twice   Seizure disorder ([place])    Senile osteoporosis 01/31/2011   Spontaneous ecchymoses 01/31/2011   Stiffness of joints, not elsewhere classified, multiple sites 01/31/2011   Unspecified essential hypertension 01/31/2011   Past Surgical History:  Procedure Laterality Date   ABDOMINAL HYSTERECTOMY  06/21/2003   TAH/BSO, omenectomy PSB resect, Stg IC cystadenofibroma   CHOLECYSTECTOMY  2005   Dr. Marlou Starks   ELBOW SURGERY Right 2008   broken   Dr. Apolonio Schneiders   EYE SURGERY     ORIF PATELLA Left 05/02/2020   Procedure: OPEN REDUCTION INTERNAL (ORIF) FIXATION LEFT PATELLA WITH MEDIAL AND LATERAL LIGAMENT REINFORCEMENTS;  Surgeon: [name], MD;  Location: [facility];  Service: Orthopedics;  Laterality: Left;   ORIF PATELLA Left 05/30/2020   Procedure: OPEN REDUCTION INTERNAL (ORIF) FIXATION PATELLA;  Surgeon: [name], MD;  Location: [facility];  Service: Orthopedics;  Laterality: Left;   RETINAL DETACHMENT SURGERY N/A    two   REVERSE SHOULDER ARTHROPLASTY Left 05/06/2019   Procedure: REVERSE SHOULDER ARTHROPLASTY;  Surgeon: Justice Britain, MD;  Location: [facility];  Service: Orthopedics;  Laterality: Left;  123min   ROTATOR CUFF REPAIR Right 2012   Dr. Theda Sers   SQUAMOUS CELL CARCINOMA EXCISION Bilateral 2012, 8/14   [place] on legs   Dr. Sarajane Jews   TONSILLECTOMY  1941   VIDEO BRONCHOSCOPY WITH ENDOBRONCHIAL NAVIGATION N/A 11/29/2015   Procedure: VIDEO BRONCHOSCOPY WITH ENDOBRONCHIAL NAVIGATION;  Surgeon: Collene Gobble, MD;  Location: [facility];  Service: Thoracic;  Laterality: N/A;    Allergies  Allergen Reactions   Dyazide [Hydrochlorothiazide W-Triamterene]     Other reaction(s): her blood pressure too much   Latex Other (See Comments)    Swelling   Other     Other reaction(s): local red reaction    Rofecoxib     Other reaction(s): shortness of breath   Sulfa Antibiotics Nausea And Vomiting   Tetracycline Hcl     Other reaction(s): Cant take  due to a drug interation    Allergies as of 09/25/2020       Reactions   Dyazide [hydrochlorothiazide W-triamterene]    Other reaction(s): her blood pressure too much   Latex Other (See Comments)   Swelling   Other    Other reaction(s): local red reaction   Rofecoxib    Other reaction(s): shortness of breath   Sulfa Antibiotics Nausea And Vomiting   Tetracycline Hcl    Other reaction(s): Cant take due to a drug interation        Medication List        Accurate as  of September 25, 2020  4:11 PM. If you have any questions, ask your nurse or doctor.          acetaminophen 500 MG tablet Commonly known as: TYLENOL Take 2 tablets (1,000 mg total) by mouth every 6 (six) hours as needed.   amLODipine 2.5 MG tablet Commonly known as: NORVASC Take 2.5 mg by mouth daily.   aspirin 81 MG chewable tablet Chew 81 mg by mouth daily.   CeraVe Crea Apply 1 application topically daily. Mix cream with Triamcinolone   cholecalciferol 1000 units tablet Commonly known as: VITAMIN D Take 1 tablet (1,000 Units total) by mouth daily.   furosemide 20 MG tablet Commonly known as: LASIX Take 20 mg by mouth daily.   guaiFENesin 600 MG 12 hr tablet Commonly known as: MUCINEX Take 600 mg by mouth as needed.   hydrocortisone 2.5 % lotion Apply 1 application topically daily as needed (psoriasis).   ketoconazole 2 % cream Commonly known as: NIZORAL Apply 1 application topically as needed (for psoriasis).   lamoTRIgine 150 MG tablet Commonly known as: LAMICTAL Take 150 mg by mouth 2 (two) times daily.   levETIRAcetam 500 MG tablet Commonly known as: KEPPRA Take 500 mg by mouth at bedtime.   levETIRAcetam 250 MG tablet Commonly known as: KEPPRA Take 250 mg by mouth daily. In the morning   memantine 10 MG tablet Commonly known as: NAMENDA TAKE 1 TABLET BY MOUTH TWICE DAILY.   nystatin powder Commonly known as: MYCOSTATIN/NYSTOP Apply 1 application topically 2 (two) times daily  as needed (tinea cruris).   oxyCODONE 5 MG immediate release tablet Commonly known as: Oxy IR/ROXICODONE Take 1 tablet (5 mg total) by mouth every 6 (six) hours as needed for severe pain.   pantoprazole 40 MG tablet Commonly known as: PROTONIX Take 40 mg by mouth daily.   polyethylene glycol 17 g packet Commonly known as: MIRALAX / GLYCOLAX Take 17 g by mouth daily.   Potassium Chloride ER 20 MEQ Tbcr Take 20 mEq by mouth daily.   pramipexole 0.25 MG tablet Commonly known as: MIRAPEX Take 0.5 mg by mouth at bedtime.   Robitussin Cough+ Chest Max St 10-200 MG/5ML Liqd Generic drug: Dextromethorphan-guaiFENesin Take 5 mLs by mouth daily as needed.   sennosides-docusate sodium 8.6-50 MG tablet Commonly known as: SENOKOT-S Take 2 tablets by mouth at bedtime.   simvastatin 10 MG tablet Commonly known as: ZOCOR Take 10 mg by mouth at bedtime.   tiotropium 18 MCG inhalation capsule Commonly known as: Spiriva HandiHaler INHALE CONTENTS OF ONE CAPSULE ONCE DAILY FOR COPD.   triamcinolone cream 0.1 % Commonly known as: KENALOG Apply 1 application topically daily. Mix with cerave   Zinc 50 MG Tabs Take 1 tablet by mouth daily.        Review of Systems  Constitutional:  Positive for fatigue. Negative for appetite change and fever.  HENT:  Positive for congestion, hearing loss and rhinorrhea. Negative for sinus pressure, sinus pain, sore throat and voice change.   Eyes:  Negative for visual disturbance.  Respiratory:  Positive for cough and shortness of breath. Negative for wheezing.        DOE is chronic  Cardiovascular:  Positive for leg swelling. Negative for chest pain and palpitations.  Gastrointestinal:  Negative for abdominal pain and constipation.       Acid reflux symptoms.   Genitourinary:  Negative for dysuria and urgency.  Musculoskeletal:  Positive for arthralgias, gait problem and joint swelling.  ORIF of the left patella.   Skin:  Negative for color  change.       BLE discoloration.   Neurological:  Positive for seizures. Negative for speech difficulty, weakness and headaches.       Memory lapses. Hx of seizures. RLS  Psychiatric/Behavioral:  Negative for confusion and sleep disturbance. The patient is not nervous/anxious.    Immunization History  Administered Date(s) Administered   Influenza Split 01/06/2014, 01/15/2017, 01/08/2018, 12/09/2018   Influenza Whole 01/07/2012, 01/06/2013   Influenza, High Dose Seasonal PF 12/25/2015, 01/20/2017   Influenza,inj,Quad PF,6+ Mos 12/21/2014   Influenza-Unspecified 12/09/2018, 01/18/2020   Moderna Sars-Covid-2 Vaccination 04/12/2019, 06/05/2019, 02/15/2020, 09/05/2020   Pneumococcal Conjugate-13 02/01/2014   Pneumococcal Polysaccharide-23 12/20/1992, 01/15/2000, 04/08/2004, 06/08/2004   Td 04/08/2002, 04/22/2002   Tdap 04/09/2011, 08/17/2015   Zoster Recombinat (Shingrix) 05/29/2005, 08/27/2017   Zoster, Live 04/08/2008, 05/29/2014, 05/28/2017   Zoster, Unspecified 05/29/2005   Pertinent  Health Maintenance Due  Topic Date Due   INFLUENZA VACCINE  11/06/2020   DEXA SCAN  Completed   PNA vac Low Risk Adult  Completed   MAMMOGRAM  Discontinued   Fall Risk  03/24/2014 10/15/2012  Falls in the past year? No No   Functional Status Survey:    Vitals:   09/25/20 1534  BP: 128/86  Pulse: 65  Resp: 18  Temp: (!) 97.5 F (36.4 C)  SpO2: 94%  Weight: 152 lb (68.9 kg)  Height: $Remove'5\' 2"'DApDJuw$  (1.575 m)   Body mass index is 27.8 kg/m. Physical Exam Vitals and nursing note reviewed.  Constitutional:      Comments: Appears tired.   HENT:     Head: Normocephalic and atraumatic.     Mouth/Throat:     Mouth: Mucous membranes are moist.  Eyes:     Extraocular Movements: Extraocular movements intact.     Conjunctiva/sclera:     Right eye: Right conjunctiva is not injected.     Left eye: Left conjunctiva is not injected.     Pupils: Pupils are equal, round, and reactive to light.   Cardiovascular:     Rate and Rhythm: Normal rate and regular rhythm.     Heart sounds: Murmur heard.     Comments: PD pulses are not felt.  Pulmonary:     Effort: Pulmonary effort is normal.     Breath sounds: Rales present. No wheezing or rhonchi.     Comments: Decreased air entry to both lungs. Bibasilar rales.  Abdominal:     General: Bowel sounds are normal.     Palpations: Abdomen is soft.     Tenderness: There is no abdominal tenderness.     Comments: Mid abd surgical scar  Musculoskeletal:     Cervical back: Normal range of motion and neck supple.     Right lower leg: Edema present.     Left lower leg: Edema present.     Comments:  Decreased overhead ROM of the left shoulder. BLE edema trace  is chronic. Left knee s/p ORIF of the patella is in brace now  Skin:    General: Skin is warm and dry.     Comments: Brownish skin discoloration BLE.   Neurological:     General: No focal deficit present.     Mental Status: She is alert and oriented to person, place, and time. Mental status is at baseline.     Gait: Gait abnormal.  Psychiatric:        Mood and Affect: Mood normal.  Behavior: Behavior normal.        Thought Content: Thought content normal.    Labs reviewed: Recent Labs    05/02/20 0630 05/26/20 0935 06/08/20 0000 06/13/20 0000 08/04/20 1205  NA 140 141 143 142 142  K 3.5 3.9 4.1 3.9 4.6  CL 104 104 105 108 104  CO2 25 26 31* 24* 27*  GLUCOSE 99 86  --   --   --   BUN 25* 27* 24* 22* 17  CREATININE 0.97 0.94 1.0 0.9 1.0  CALCIUM 9.2 9.5 9.4 9.6 10.0   Recent Labs    01/11/20 0000 06/08/20 0000  AST 16 16  ALT 11 9  ALKPHOS 109 138*  ALBUMIN 4.5 3.9   Recent Labs    01/11/20 0000 05/02/20 0630 05/26/20 0935 06/08/20 0000  WBC 5.1 6.3 5.7 5.5  NEUTROABS 2,953.00  --   --  3,515.00  HGB 14.5 13.6 14.7 14.2  HCT 44 42.6 46.5* 43  MCV  --  89.7 91.7  --   PLT 249 223 253 247   Lab Results  Component Value Date   TSH 1.79 06/29/2019    No results found for: HGBA1C Lab Results  Component Value Date   CHOL 164 10/12/2019   HDL 67 10/12/2019   LDLCALC 82 10/12/2019   TRIG 70 10/12/2019   CHOLHDL 3.6 06/27/2015    Significant Diagnostic Results in last 30 days:  No results found.  Assessment/Plan COVID-19 virus infection nasal congestion, cough, and fatigue x 4 days. She was placed on Vit C, Zinc, Mucinex. The patient is afebrile, no s/o chest pain, SOB, her appetite and oral intakes is at her baseline. No O2 desaturation. The patient desired Paxlovid x 5 days.   Nondisplaced transverse fracture of left patella, subsequent encounter for closed fracture with nonunion Fx of the left patella from a mechanical fall in the bathroom at [facility] 04/26/20. Resulted in a closed nondisplaced transverse fracture left patella, f/u Ortho s/p  ORIF.  prn Oxycodone for pain. WBAT, in brace, working with therapy.   GERD (gastroesophageal reflux disease) ST eval significant reflux for several weeks results of mucus production, no observed. oropharyngeal dysphagia. Takes  Omeprazole. Hgb 14.2 06/08/20, improved.   Restless legs syndrome (RLS) takes MiraPex 0.$RemoveBeforeD'5mg'oxIrwBCsurSnEI$  hs  Senile dementia (HCC) on Memantine $RemoveBefo'10mg'vXNdnSXawWN$  bid.  Seizure disorder ([place]) stable, on Keppra $RemoveB'500mg'jdxcKHNo$  qhs, $Rem'250mg'leyE$  qd, Lamictal $RemoveBefo'150mg'ECkAwXUqggV$  bid.  Edema race, on Furosemide,  Bun/creat 17/0.99 eGFR 60 08/03/20  Essential hypertension Blood pressure is controlled, continue Amlodipine.   COPD (chronic obstructive pulmonary disease) (HCC)  takes Spiriva qd, prn Albuterol HFA  Slow transit constipation takes Senokot, MiraLax.     Family/ staff Communication: plan of care reviewed with the patient and charge nurse.   Labs/tests ordered:  none  Time spend 35 minutes.

## 2020-09-25 NOTE — Assessment & Plan Note (Signed)
on Memantine 10mg  bid.

## 2020-09-25 NOTE — Assessment & Plan Note (Signed)
takes MiraPex 0.5mg  hs

## 2020-09-25 NOTE — Assessment & Plan Note (Signed)
stable, on Keppra 500mg  qhs, 250mg  qd, Lamictal 150mg  bid.

## 2020-09-25 NOTE — Assessment & Plan Note (Signed)
takes Senokot, MiraLax.

## 2020-09-25 NOTE — Assessment & Plan Note (Signed)
takes Spiriva qd, prn Albuterol HFA

## 2020-10-04 DIAGNOSIS — S82002D Unspecified fracture of left patella, subsequent encounter for closed fracture with routine healing: Secondary | ICD-10-CM | POA: Diagnosis not present

## 2020-10-11 ENCOUNTER — Non-Acute Institutional Stay (SKILLED_NURSING_FACILITY): Payer: Medicare PPO | Admitting: Nurse Practitioner

## 2020-10-11 ENCOUNTER — Encounter: Payer: Self-pay | Admitting: Nurse Practitioner

## 2020-10-11 DIAGNOSIS — G40909 Epilepsy, unspecified, not intractable, without status epilepticus: Secondary | ICD-10-CM | POA: Diagnosis not present

## 2020-10-11 DIAGNOSIS — G2581 Restless legs syndrome: Secondary | ICD-10-CM | POA: Diagnosis not present

## 2020-10-11 DIAGNOSIS — R609 Edema, unspecified: Secondary | ICD-10-CM | POA: Diagnosis not present

## 2020-10-11 DIAGNOSIS — K219 Gastro-esophageal reflux disease without esophagitis: Secondary | ICD-10-CM | POA: Diagnosis not present

## 2020-10-11 DIAGNOSIS — S82035K Nondisplaced transverse fracture of left patella, subsequent encounter for closed fracture with nonunion: Secondary | ICD-10-CM

## 2020-10-11 DIAGNOSIS — U071 COVID-19: Secondary | ICD-10-CM | POA: Diagnosis not present

## 2020-10-11 DIAGNOSIS — F0390 Unspecified dementia without behavioral disturbance: Secondary | ICD-10-CM | POA: Diagnosis not present

## 2020-10-11 NOTE — Assessment & Plan Note (Signed)
stable, on Keppra 500mg  qhs, 250mg  qd, Lamictal 150mg  bid.

## 2020-10-11 NOTE — Assessment & Plan Note (Signed)
C/o ? DVT since the left calf is slightly warm, tender when palpated, some zinging sensation, mild swelling, wants Doppler. Will obtain venous US LLE

## 2020-10-11 NOTE — Assessment & Plan Note (Signed)
takes MiraPex 0.5mg  hs

## 2020-10-11 NOTE — Assessment & Plan Note (Signed)
ST eval significant reflux for several weeks results of mucus production, no observed. oropharyngeal dysphagia. Takes  Omeprazole. Hgb 14.2 06/08/20, improved

## 2020-10-11 NOTE — Assessment & Plan Note (Signed)
S/p Paxlovid, resolved.

## 2020-10-11 NOTE — Progress Notes (Signed)
Location:   [facility] Room Number: [place] of Service:  SNF (31) Provider:  Montasia Chisenhall Otho Darner, NP    Patient Care Team: [name], MD as PCP - General (Internal Medicine) [name], MD as Consulting Physician (Ophthalmology) [name], MD as Consulting Physician (Cardiology) [name], MD as Consulting Physician (Dermatology) [name], MD as Consulting Physician (Orthopedic Surgery) [name], MD as Consulting Physician (Orthopedic Surgery) [name], MD as Consulting Physician (Orthopedic Surgery) [place], [place], Haiden Clucas X, NP as Nurse Practitioner (Nurse Practitioner) [name], MD as Consulting Physician (Neurology)  Extended Emergency Contact Information Primary Emergency Contact: [name],[name] Address: [address]          [place], [place] [address] [name] of [place] Phone: [phone] Relation: Sister Secondary Emergency Contact: [name] [place] of [place] Phone: [phone] Mobile Phone: [phone] Relation: Sister  Code Status:  DNR Goals of care: Advanced Directive information Advanced Directives 10/11/2020  Does Patient Have a Medical Advance Directive? Yes  Type of Advance Directive Out of facility DNR (pink MOST or yellow form)  Does patient want to make changes to medical advance directive? No - Patient declined  Copy of [place] in Chart? -  Pre-existing out of facility DNR order (yellow form or pink MOST form) Yellow form placed in chart (order not valid for inpatient use);Pink MOST form placed in chart (order not valid for inpatient use)     Chief Complaint  Patient presents with   Medical Management of Chronic Issues    Routine follow up   Health Maintenance    Discuss need for shingles vaccine.    HPI:  Pt is a 87 y.o. female seen today for medical management of chronic diseases.    COVID infection,  s/p Paxlovid, recovered.              A closed nondisplaced transverse fracture left patella 04/26/20, f/u Ortho s/p  ORIF.  prn Oxycodone for pain. WBAT, in brace, working with therapy.   C/o ? DVT since the left calf is slightly warm, tender when palpated, some zinging sensation, mild swelling, wants Doppler.              GERD ST eval significant reflux for several weeks results of mucus production, no observed. oropharyngeal dysphagia. Takes  Omeprazole. Hgb 14.2 06/08/20, improved.              Restless leg symptom, takes MiraPex 0.$RemoveBeforeD'5mg'nIOVwSAPTtyjqH$  hs             Hx of dementia,  on Memantine $RemoveBefo'10mg'cZVgpNgokNF$  bid.             Hx of seizures, stable, on Keppra $RemoveB'500mg'IuXpKziq$  qhs, $Rem'250mg'JQKO$  qd, Lamictal $RemoveBefo'150mg'YkIVaFUPSZl$  bid.             Edema, trace, on Furosemide,  Bun/creat 17/0.99 eGFR 60 08/03/20             OA in general, on Tylenol $RemoveBe'650mg'oQXiDItKz$  qd.              HTN, takes Amlodipine. Bun/creat 17/1.0 eGFR 52 08/04/20             COPD, takes Spiriva qd, prn Albuterol HFA             Constipation, takes Senokot, MiraLax.              OP, stopped  Foxamax per her request.    Past Medical History:  Diagnosis Date   Acute bronchitis 05/23/2011   Acute upper respiratory infections of unspecified site 05/23/2011   Arthritis    Chronic airway obstruction, not elsewhere classified 05/23/2011   Disturbance of salivary secretion 01/31/2011   Dizziness and giddiness 01/31/2011   Dyspnea    Essential tremor 04/25/2014   External hemorrhoids without mention of complication 57/32/2025   Gait disorder 04/25/2014   Insomnia, unspecified 09/12/2011   Lumbago 01/31/2011   Major depressive disorder, single episode, unspecified 01/31/2011   Memory disorder 04/25/2014   Mitral valve disorders(424.0) 01/31/2011   Other and unspecified hyperlipidemia 01/31/2011   Other convulsions 01/31/2011   Other emphysema ([place]) 01/31/2011   Pain in joint, site unspecified 01/31/2011   Restless legs syndrome (RLS) 09/12/2011   Retinal detachment with retinal defect of right eye  2011   right eye twice   Seizure disorder ([place])    Senile osteoporosis 01/31/2011   Spontaneous ecchymoses 01/31/2011   Stiffness of joints, not elsewhere classified, multiple sites 01/31/2011   Unspecified essential hypertension 01/31/2011   Past Surgical History:  Procedure Laterality Date   ABDOMINAL HYSTERECTOMY  06/21/2003   TAH/BSO, omenectomy PSB resect, Stg IC cystadenofibroma   CHOLECYSTECTOMY  2005   Dr. Marlou Starks   ELBOW SURGERY Right 2008   broken   Dr. Apolonio Schneiders   EYE SURGERY     ORIF PATELLA Left 05/02/2020   Procedure: OPEN REDUCTION INTERNAL (ORIF) FIXATION LEFT PATELLA WITH MEDIAL AND LATERAL LIGAMENT REINFORCEMENTS;  Surgeon: [name], MD;  Location: [facility];  Service: Orthopedics;  Laterality: Left;   ORIF PATELLA Left 05/30/2020   Procedure: OPEN REDUCTION INTERNAL (ORIF) FIXATION PATELLA;  Surgeon: [name], MD;  Location: [facility];  Service: Orthopedics;  Laterality: Left;   RETINAL DETACHMENT SURGERY N/A    two   REVERSE SHOULDER ARTHROPLASTY Left 05/06/2019   Procedure: REVERSE SHOULDER ARTHROPLASTY;  Surgeon: Justice Britain, MD;  Location: [facility];  Service: Orthopedics;  Laterality: Left;  158min   ROTATOR CUFF REPAIR Right 2012   Dr. Theda Sers   SQUAMOUS CELL CARCINOMA EXCISION Bilateral 2012, 8/14   [place] on legs   Dr. Sarajane Jews   TONSILLECTOMY  1941   VIDEO BRONCHOSCOPY WITH ENDOBRONCHIAL NAVIGATION N/A 11/29/2015   Procedure: VIDEO BRONCHOSCOPY WITH ENDOBRONCHIAL NAVIGATION;  Surgeon: Collene Gobble, MD;  Location: [facility];  Service: Thoracic;  Laterality: N/A;    Allergies  Allergen Reactions   Dyazide [Hydrochlorothiazide W-Triamterene]     Other reaction(s): her blood pressure too much   Latex Other (See Comments)    Swelling   Other     Other reaction(s): local red reaction    Rofecoxib     Other reaction(s): shortness of breath   Sulfa Antibiotics Nausea And Vomiting   Tetracycline Hcl     Other reaction(s): Cant take due to a drug  interation    Allergies as of 10/11/2020       Reactions   Dyazide [hydrochlorothiazide W-triamterene]    Other reaction(s): her blood pressure too much   Latex Other (See Comments)   Swelling   Other    Other reaction(s): local red reaction   Rofecoxib    Other reaction(s): shortness of breath   Sulfa Antibiotics Nausea And Vomiting   Tetracycline Hcl    Other reaction(s): Cant take due to a drug interation        Medication List        Accurate  as of October 11, 2020 11:59 PM. If you have any questions, ask your nurse or doctor.          STOP taking these medications    Zinc 50 MG Tabs Stopped by: Andjela Wickes X Mirza Kidney, NP       TAKE these medications    acetaminophen 500 MG tablet Commonly known as: TYLENOL Take 2 tablets (1,000 mg total) by mouth every 6 (six) hours as needed.   amLODipine 2.5 MG tablet Commonly known as: NORVASC Take 2.5 mg by mouth daily.   aspirin 81 MG chewable tablet Chew 81 mg by mouth daily.   CeraVe Crea Apply 1 application topically daily. Mix cream with Triamcinolone   cholecalciferol 1000 units tablet Commonly known as: VITAMIN D Take 1 tablet (1,000 Units total) by mouth daily.   furosemide 20 MG tablet Commonly known as: LASIX Take 20 mg by mouth daily.   guaiFENesin 600 MG 12 hr tablet Commonly known as: MUCINEX Take 600 mg by mouth as needed.   hydrocortisone 2.5 % lotion Apply 1 application topically daily as needed (psoriasis).   ketoconazole 2 % cream Commonly known as: NIZORAL Apply 1 application topically as needed (for psoriasis).   lamoTRIgine 150 MG tablet Commonly known as: LAMICTAL Take 150 mg by mouth 2 (two) times daily.   levETIRAcetam 500 MG tablet Commonly known as: KEPPRA Take 500 mg by mouth at bedtime.   levETIRAcetam 250 MG tablet Commonly known as: KEPPRA Take 250 mg by mouth daily. In the morning   memantine 10 MG tablet Commonly known as: NAMENDA TAKE 1 TABLET BY MOUTH TWICE DAILY.    nystatin powder Commonly known as: MYCOSTATIN/NYSTOP Apply 1 application topically 2 (two) times daily as needed (tinea cruris).   oxyCODONE 5 MG immediate release tablet Commonly known as: Oxy IR/ROXICODONE Take 1 tablet (5 mg total) by mouth every 6 (six) hours as needed for severe pain.   pantoprazole 40 MG tablet Commonly known as: PROTONIX Take 40 mg by mouth daily.   polyethylene glycol 17 g packet Commonly known as: MIRALAX / GLYCOLAX Take 17 g by mouth daily.   Potassium Chloride ER 20 MEQ Tbcr Take 20 mEq by mouth daily.   pramipexole 0.25 MG tablet Commonly known as: MIRAPEX Take 0.5 mg by mouth at bedtime.   Robitussin Cough+ Chest Max St 10-200 MG/5ML Liqd Generic drug: Dextromethorphan-guaiFENesin Take 5 mLs by mouth daily as needed.   sennosides-docusate sodium 8.6-50 MG tablet Commonly known as: SENOKOT-S Take 2 tablets by mouth at bedtime.   simvastatin 10 MG tablet Commonly known as: ZOCOR Take 10 mg by mouth at bedtime.   tiotropium 18 MCG inhalation capsule Commonly known as: Spiriva HandiHaler INHALE CONTENTS OF ONE CAPSULE ONCE DAILY FOR COPD.   triamcinolone cream 0.1 % Commonly known as: KENALOG Apply 1 application topically daily. Mix with cerave        Review of Systems  Constitutional:  Negative for appetite change, fatigue and fever.  HENT:  Positive for congestion and hearing loss. Negative for trouble swallowing.   Eyes:  Negative for visual disturbance.  Respiratory:  Negative for cough.        DOE is chronic  Cardiovascular:  Positive for leg swelling. Negative for chest pain and palpitations.  Gastrointestinal:  Negative for abdominal pain and constipation.       Acid reflux symptoms.   Genitourinary:  Negative for dysuria and urgency.  Musculoskeletal:  Positive for arthralgias, gait problem and joint swelling.  ORIF of the left patella.   Skin:  Negative for color change.       BLE discoloration.  the left calf is  slightly warm, tender when palpated, some zinging sensation, mild swelling,  Neurological:  Positive for seizures. Negative for speech difficulty, weakness and headaches.       Memory lapses. Hx of seizures. RLS  Psychiatric/Behavioral:  Negative for confusion and sleep disturbance. The patient is not nervous/anxious.    Immunization History  Administered Date(s) Administered   Influenza Split 01/06/2014, 01/15/2017, 01/08/2018, 12/09/2018   Influenza Whole 01/07/2012, 01/06/2013   Influenza, High Dose Seasonal PF 12/25/2015, 01/20/2017   Influenza,inj,Quad PF,6+ Mos 12/21/2014   Influenza-Unspecified 12/09/2018, 01/18/2020   Moderna Sars-Covid-2 Vaccination 04/12/2019, 06/05/2019, 02/15/2020, 09/05/2020   Pneumococcal Conjugate-13 02/01/2014   Pneumococcal Polysaccharide-23 12/20/1992, 01/15/2000, 04/08/2004, 06/08/2004   Td 04/08/2002, 04/22/2002   Tdap 04/09/2011, 08/17/2015   Zoster Recombinat (Shingrix) 05/29/2005, 08/27/2017   Zoster, Live 04/08/2008, 05/29/2014, 05/28/2017   Zoster, Unspecified 05/29/2005   Pertinent  Health Maintenance Due  Topic Date Due   INFLUENZA VACCINE  11/06/2020   DEXA SCAN  Completed   PNA vac Low Risk Adult  Completed   MAMMOGRAM  Discontinued   Fall Risk  03/24/2014 10/15/2012  Falls in the past year? No No   Functional Status Survey:    Vitals:   10/11/20 1419  BP: 120/68  Pulse: 80  Resp: 18  Temp: (!) 97.5 F (36.4 C)  SpO2: 96%  Weight: 150 lb 8 oz (68.3 kg)  Height: $Remove'5\' 2"'ISkKLNt$  (1.575 m)   Body mass index is 27.53 kg/m. Physical Exam Vitals and nursing note reviewed.  Constitutional:      Appearance: Normal appearance.  HENT:     Head: Normocephalic and atraumatic.     Mouth/Throat:     Mouth: Mucous membranes are moist.  Eyes:     Extraocular Movements: Extraocular movements intact.     Conjunctiva/sclera:     Right eye: Right conjunctiva is not injected.     Left eye: Left conjunctiva is not injected.     Pupils: Pupils  are equal, round, and reactive to light.  Cardiovascular:     Rate and Rhythm: Normal rate and regular rhythm.     Heart sounds: Murmur heard.     Comments: PD pulses are not felt.  Pulmonary:     Effort: Pulmonary effort is normal.     Breath sounds: Rales present. No wheezing or rhonchi.     Comments: Decreased air entry to both lungs. Bibasilar rales.  Abdominal:     General: Bowel sounds are normal.     Palpations: Abdomen is soft.     Tenderness: There is no abdominal tenderness.     Comments: Mid abd surgical scar  Musculoskeletal:     Cervical back: Normal range of motion and neck supple.     Right lower leg: Edema present.     Left lower leg: Edema present.     Comments:  Decreased overhead ROM of the left shoulder. BLE edema trace  is chronic. Left knee s/p ORIF of the patella.  The left calf is slightly warm, tender when palpated, some zinging sensation, mild swelling,  Skin:    General: Skin is warm and dry.     Comments: Brownish skin discoloration BLE.   Neurological:     General: No focal deficit present.     Mental Status: She is alert and oriented to person, place, and time. Mental status is  at baseline.     Gait: Gait abnormal.  Psychiatric:        Mood and Affect: Mood normal.        Behavior: Behavior normal.        Thought Content: Thought content normal.    Labs reviewed: Recent Labs    05/02/20 0630 05/26/20 0935 06/08/20 0000 06/13/20 0000 08/04/20 1205  NA 140 141 143 142 142  K 3.5 3.9 4.1 3.9 4.6  CL 104 104 105 108 104  CO2 25 26 31* 24* 27*  GLUCOSE 99 86  --   --   --   BUN 25* 27* 24* 22* 17  CREATININE 0.97 0.94 1.0 0.9 1.0  CALCIUM 9.2 9.5 9.4 9.6 10.0   Recent Labs    01/11/20 0000 06/08/20 0000  AST 16 16  ALT 11 9  ALKPHOS 109 138*  ALBUMIN 4.5 3.9   Recent Labs    01/11/20 0000 05/02/20 0630 05/26/20 0935 06/08/20 0000  WBC 5.1 6.3 5.7 5.5  NEUTROABS 2,953.00  --   --  3,515.00  HGB 14.5 13.6 14.7 14.2  HCT 44 42.6  46.5* 43  MCV  --  89.7 91.7  --   PLT 249 223 253 247   Lab Results  Component Value Date   TSH 1.79 06/29/2019   No results found for: HGBA1C Lab Results  Component Value Date   CHOL 164 10/12/2019   HDL 67 10/12/2019   LDLCALC 82 10/12/2019   TRIG 70 10/12/2019   CHOLHDL 3.6 06/27/2015    Significant Diagnostic Results in last 30 days:  No results found.  Assessment/Plan Edema C/o ? DVT since the left calf is slightly warm, tender when palpated, some zinging sensation, mild swelling, wants Doppler. Will obtain venous US LLE  COVID-19 virus infection S/p Paxlovid, resolved.   Nondisplaced transverse fracture of left patella, subsequent encounter for closed fracture with nonunion A closed nondisplaced transverse fracture left patella 04/26/20, f/u Ortho s/p  ORIF.  prn Oxycodone for pain. WBAT, in brace, working with therapy.   GERD (gastroesophageal reflux disease) ST eval significant reflux for several weeks results of mucus production, no observed. oropharyngeal dysphagia. Takes  Omeprazole. Hgb 14.2 06/08/20, improved  Restless legs syndrome (RLS) takes MiraPex 0.$RemoveBeforeD'5mg'aNKwRcEEYREMDt$  hs  Senile dementia ([facility]) No behavioral issues, on Memantine $RemoveBefo'10mg'hRIUusPWMsZ$  bid.  Seizure disorder ([place]) stable, on Keppra $RemoveB'500mg'ThDUlzbF$  qhs, $Rem'250mg'nfSY$  qd, Lamictal $RemoveBefo'150mg'rbOvXdyVUIE$  bid.    Family/ staff Communication: plan of care reviewed with the patient and charge nurse.   Labs/tests ordered: venous US LLE  Time spend 35 minutes.

## 2020-10-11 NOTE — Assessment & Plan Note (Signed)
A closed nondisplaced transverse fracture left patella 04/26/20, f/u Ortho s/p  ORIF.  prn Oxycodone for pain. WBAT, in brace, working with therapy.

## 2020-10-11 NOTE — Assessment & Plan Note (Signed)
No behavioral issues, on Memantine 10mg  bid.

## 2020-10-12 ENCOUNTER — Encounter: Payer: Self-pay | Admitting: Nurse Practitioner

## 2020-10-13 DIAGNOSIS — M79662 Pain in left lower leg: Secondary | ICD-10-CM | POA: Diagnosis not present

## 2020-11-06 ENCOUNTER — Non-Acute Institutional Stay (SKILLED_NURSING_FACILITY): Payer: Medicare PPO | Admitting: Nurse Practitioner

## 2020-11-06 DIAGNOSIS — K219 Gastro-esophageal reflux disease without esophagitis: Secondary | ICD-10-CM | POA: Diagnosis not present

## 2020-11-06 DIAGNOSIS — G2581 Restless legs syndrome: Secondary | ICD-10-CM

## 2020-11-06 DIAGNOSIS — K5901 Slow transit constipation: Secondary | ICD-10-CM

## 2020-11-06 DIAGNOSIS — G40909 Epilepsy, unspecified, not intractable, without status epilepticus: Secondary | ICD-10-CM | POA: Diagnosis not present

## 2020-11-06 DIAGNOSIS — J431 Panlobular emphysema: Secondary | ICD-10-CM

## 2020-11-06 DIAGNOSIS — R609 Edema, unspecified: Secondary | ICD-10-CM | POA: Diagnosis not present

## 2020-11-06 DIAGNOSIS — I10 Essential (primary) hypertension: Secondary | ICD-10-CM

## 2020-11-06 DIAGNOSIS — F0390 Unspecified dementia without behavioral disturbance: Secondary | ICD-10-CM | POA: Diagnosis not present

## 2020-11-06 DIAGNOSIS — S82035K Nondisplaced transverse fracture of left patella, subsequent encounter for closed fracture with nonunion: Secondary | ICD-10-CM

## 2020-11-06 DIAGNOSIS — M810 Age-related osteoporosis without current pathological fracture: Secondary | ICD-10-CM

## 2020-11-06 DIAGNOSIS — U071 COVID-19: Secondary | ICD-10-CM

## 2020-11-07 ENCOUNTER — Encounter: Payer: Self-pay | Admitting: Nurse Practitioner

## 2020-11-07 NOTE — Assessment & Plan Note (Addendum)
Mildly elevated upon my examination, the patient denied HA, change of vision, chest pain/pressure, palpitation, SOB, or generalized malaise, takes Amlodipine. Bun/creat 17/1.0 eGFR 52 08/04/20. VS qshift x 72 hours.

## 2020-11-07 NOTE — Assessment & Plan Note (Signed)
Restless leg symptom, takes MiraPex 0.'5mg'$  hs

## 2020-11-07 NOTE — Assessment & Plan Note (Signed)
stable, on Keppra 500mg  qhs, 250mg  qd, Lamictal 150mg  bid.

## 2020-11-07 NOTE — Progress Notes (Deleted)
Location:   [place] Room Number: [place] of Service:  SNF (31) Provider:  Man Otho Darner, NP    Patient Care Team: [name], MD as PCP - General (Internal Medicine) [name], MD as Consulting Physician (Ophthalmology) [name], MD as Consulting Physician (Cardiology) [name], MD as Consulting Physician (Dermatology) [name], MD as Consulting Physician (Orthopedic Surgery) [name], MD as Consulting Physician (Orthopedic Surgery) [name], MD as Consulting Physician (Orthopedic Surgery) [place], [place], Man X, NP as Nurse Practitioner (Nurse Practitioner) [name], MD as Consulting Physician (Neurology)  Extended Emergency Contact Information Primary Emergency Contact: [name],[name] Address: [address]          [place], [place] [address] [name] of [place] Phone: [phone] Relation: Sister Secondary Emergency Contact: [name] [place] of [place] Phone: [phone] Mobile Phone: [phone] Relation: Sister  Code Status:  DNR Goals of care: Advanced Directive information Advanced Directives 11/07/2020  Does Patient Have a Medical Advance Directive? Yes  Type of Advance Directive Out of facility DNR (pink MOST or yellow form)  Does patient want to make changes to medical advance directive? No - Patient declined  Copy of [place] in Chart? -  Pre-existing out of facility DNR order (yellow form or pink MOST form) Yellow form placed in chart (order not valid for inpatient use);Pink MOST form placed in chart (order not valid for inpatient use)     Chief Complaint  Patient presents with   Acute Visit    Patient complains of redness and swelling in left leg    HPI:  Pt is a 87 y.o. female seen today for an acute visit for    Past Medical History:  Diagnosis Date   Acute bronchitis 05/23/2011   Acute upper  respiratory infections of unspecified site 05/23/2011   Arthritis    Chronic airway obstruction, not elsewhere classified 05/23/2011   Disturbance of salivary secretion 01/31/2011   Dizziness and giddiness 01/31/2011   Dyspnea    Essential tremor 04/25/2014   External hemorrhoids without mention of complication [id number]   Gait disorder 04/25/2014   Insomnia, unspecified 09/12/2011   Lumbago 01/31/2011   Major depressive disorder, single episode, unspecified 01/31/2011   Memory disorder 04/25/2014   Mitral valve disorders(424.0) 01/31/2011   Other and unspecified hyperlipidemia 01/31/2011   Other convulsions 01/31/2011   Other emphysema ([place]) 01/31/2011   Pain in joint, site unspecified 01/31/2011   Restless legs syndrome (RLS) 09/12/2011   Retinal detachment with retinal defect of right eye 2011   right eye twice   Seizure disorder ([place])    Senile osteoporosis 01/31/2011   Spontaneous ecchymoses 01/31/2011   Stiffness of joints, not elsewhere classified, multiple sites 01/31/2011   Unspecified essential hypertension 01/31/2011   Past Surgical History:  Procedure Laterality Date   ABDOMINAL HYSTERECTOMY  06/21/2003   TAH/BSO, omenectomy PSB resect, Stg IC cystadenofibroma   CHOLECYSTECTOMY  2005   Dr. Marlou Starks   ELBOW SURGERY Right 2008   broken   Dr. Apolonio Schneiders   EYE SURGERY     ORIF PATELLA Left 05/02/2020   Procedure: OPEN REDUCTION INTERNAL (ORIF) FIXATION LEFT PATELLA WITH MEDIAL AND LATERAL LIGAMENT REINFORCEMENTS;  Surgeon: [name], MD;  Location: [facility];  Service: Orthopedics;  Laterality: Left;   ORIF PATELLA Left 05/30/2020   Procedure: OPEN REDUCTION INTERNAL (ORIF) FIXATION PATELLA;  Surgeon: [name], MD;  Location: [facility];  Service: Orthopedics;  Laterality: Left;   RETINAL DETACHMENT SURGERY N/A    two   REVERSE SHOULDER ARTHROPLASTY Left 05/06/2019   Procedure: REVERSE SHOULDER ARTHROPLASTY;  Surgeon: Justice Britain, MD;  Location: [facility];  Service:  Orthopedics;  Laterality: Left;  137mn   ROTATOR CUFF REPAIR Right 2012   Dr. CTheda Sers  SQUAMOUS CELL CARCINOMA EXCISION Bilateral 2012, 8/14   [place] on legs   Dr. GSarajane Jews  TONSILLECTOMY  1941   VIDEO BRONCHOSCOPY WITH ENDOBRONCHIAL NAVIGATION N/A 11/29/2015   Procedure: VIDEO BRONCHOSCOPY WITH ENDOBRONCHIAL NAVIGATION;  Surgeon: RCollene Gobble MD;  [facility]  Service: Thoracic;  Laterality: N/A;    Allergies  Allergen Reactions   Dyazide [Hydrochlorothiazide W-Triamterene]     Other reaction(s): her blood pressure too much   Latex Other (See Comments)    Swelling   Other     Other reaction(s): local red reaction    Rofecoxib     Other reaction(s): shortness of breath   Sulfa Antibiotics Nausea And Vomiting   Tetracycline Hcl     Other reaction(s): Cant take due to a drug interation    Allergies as of 11/07/2020       Reactions   Dyazide [hydrochlorothiazide W-triamterene]    Other reaction(s): her blood pressure too much   Latex Other (See Comments)   Swelling   Other    Other reaction(s): local red reaction   Rofecoxib    Other reaction(s): shortness of breath   Sulfa Antibiotics Nausea And Vomiting   Tetracycline Hcl    Other reaction(s): Cant take due to a drug interation        Medication List        Accurate as of November 07, 2020  8:19 AM. If you have any questions, ask your nurse or doctor.          acetaminophen 500 MG tablet Commonly known as: TYLENOL Take 2 tablets (1,000 mg total) by mouth every 6 (six) hours as needed.   amLODipine 2.5 MG tablet Commonly known as: NORVASC Take 2.5 mg by mouth daily.   aspirin 81 MG chewable tablet Chew 81 mg by mouth daily.   CeraVe Crea Apply 1 application topically daily. Mix cream with Triamcinolone   cholecalciferol 1000 units tablet Commonly known as: VITAMIN D Take 1 tablet (1,000 Units total) by mouth daily.   furosemide 20 MG tablet Commonly known as: LASIX Take 20 mg by mouth  daily.   guaiFENesin 600 MG 12 hr tablet Commonly known as: MUCINEX Take 600 mg by mouth as needed.   hydrocortisone 2.5 % lotion Apply 1 application topically daily as needed (psoriasis).   ketoconazole 2 % cream Commonly known as: NIZORAL Apply 1 application topically as needed (for psoriasis).   lamoTRIgine 150 MG tablet Commonly known as: LAMICTAL Take 150 mg by mouth 2 (two) times daily.   levETIRAcetam 500 MG tablet Commonly known as: KEPPRA Take 500 mg by mouth at bedtime.   levETIRAcetam 250 MG tablet Commonly known as: KEPPRA Take 250 mg by mouth daily. In the morning   memantine 10 MG tablet Commonly known as: NAMENDA TAKE 1 TABLET BY MOUTH TWICE DAILY.   nystatin powder Commonly known as: MYCOSTATIN/NYSTOP Apply 1 application topically 2 (two) times daily as needed (tinea cruris).   oxyCODONE 5 MG immediate release tablet Commonly known as: Oxy IR/ROXICODONE Take 1 tablet (5 mg total) by mouth every 6 (six)  hours as needed for severe pain.   pantoprazole 40 MG tablet Commonly known as: PROTONIX Take 40 mg by mouth daily.   polyethylene glycol 17 g packet Commonly known as: MIRALAX / GLYCOLAX Take 17 g by mouth daily.   Potassium Chloride ER 20 MEQ Tbcr Take 20 mEq by mouth daily.   pramipexole 0.25 MG tablet Commonly known as: MIRAPEX Take 0.5 mg by mouth at bedtime.   Robitussin Cough+ Chest Max St 10-200 MG/5ML Liqd Generic drug: Dextromethorphan-guaiFENesin Take 5 mLs by mouth daily as needed.   sennosides-docusate sodium 8.6-50 MG tablet Commonly known as: SENOKOT-S Take 2 tablets by mouth at bedtime.   simvastatin 10 MG tablet Commonly known as: ZOCOR Take 10 mg by mouth at bedtime.   tiotropium 18 MCG inhalation capsule Commonly known as: Spiriva HandiHaler INHALE CONTENTS OF ONE CAPSULE ONCE DAILY FOR COPD.   triamcinolone cream 0.1 % Commonly known as: KENALOG Apply 1 application topically daily. Mix with cerave         Review of Systems  Immunization History  Administered Date(s) Administered   Influenza Split 01/06/2014, 01/15/2017, 01/08/2018, 12/09/2018   Influenza Whole 01/07/2012, 01/06/2013   Influenza, High Dose Seasonal PF 12/25/2015, 01/20/2017   Influenza,inj,Quad PF,6+ Mos 12/21/2014   Influenza-Unspecified 12/09/2018, 01/18/2020   Moderna Sars-Covid-2 Vaccination 04/12/2019, 06/05/2019, 02/15/2020, 09/05/2020   Pneumococcal Conjugate-13 02/01/2014   Pneumococcal Polysaccharide-23 12/20/1992, 01/15/2000, 04/08/2004, 06/08/2004   Td 04/08/2002, 04/22/2002   Tdap 04/09/2011, 08/17/2015   Zoster Recombinat (Shingrix) 05/29/2005, 08/27/2017   Zoster, Live 04/08/2008, 05/29/2014, 05/28/2017   Zoster, Unspecified 05/29/2005   Pertinent  Health Maintenance Due  Topic Date Due   INFLUENZA VACCINE  11/06/2020   DEXA SCAN  Completed   PNA vac Low Risk Adult  Completed   MAMMOGRAM  Discontinued   Fall Risk  03/24/2014 10/15/2012  Falls in the past year? No No   Functional Status Survey:    Vitals:   11/07/20 0814  BP: 120/76  Pulse: 78  Resp: 14  Temp: 97.6 F (36.4 C)  SpO2: 92%  Weight: 154 lb (69.9 kg)  Height: '5\' 2"'$  (1.575 m)   Body mass index is 28.17 kg/m. Physical Exam  Labs reviewed: Recent Labs    05/02/20 0630 05/26/20 0935 06/08/20 0000 06/13/20 0000 08/04/20 1205  NA 140 141 143 142 142  K 3.5 3.9 4.1 3.9 4.6  CL 104 104 105 108 104  CO2 25 26 31* 24* 27*  GLUCOSE 99 86  --   --   --   BUN 25* 27* 24* 22* 17  CREATININE 0.97 0.94 1.0 0.9 1.0  CALCIUM 9.2 9.5 9.4 9.6 10.0   Recent Labs    01/11/20 0000 06/08/20 0000  AST 16 16  ALT 11 9  ALKPHOS 109 138*  ALBUMIN 4.5 3.9   Recent Labs    01/11/20 0000 05/02/20 0630 05/26/20 0935 06/08/20 0000  WBC 5.1 6.3 5.7 5.5  NEUTROABS 2,953.00  --   --  3,515.00  HGB 14.5 13.6 14.7 14.2  HCT 44 42.6 46.5* 43  MCV  --  89.7 91.7  --   PLT 249 223 253 247   Lab Results  Component Value Date    TSH 1.79 06/29/2019   No results found for: HGBA1C Lab Results  Component Value Date   CHOL 164 10/12/2019   HDL 67 10/12/2019   LDLCALC 82 10/12/2019   TRIG 70 10/12/2019   CHOLHDL 3.6 06/27/2015    Significant Diagnostic Results in  last 30 days:  No results found.  Assessment/Plan There are no diagnoses linked to this encounter.   Family/ staff Communication:   Labs/tests ordered:

## 2020-11-07 NOTE — Assessment & Plan Note (Signed)
COVID infection, s/p Paxlovid, recovered.

## 2020-11-07 NOTE — Assessment & Plan Note (Signed)
stopped Foxamax per her request.

## 2020-11-07 NOTE — Assessment & Plan Note (Signed)
takes Senokot, MiraLax.

## 2020-11-07 NOTE — Assessment & Plan Note (Signed)
takes Spiriva qd, prn Albuterol HFA

## 2020-11-07 NOTE — Assessment & Plan Note (Addendum)
10/11/20 C/o ? DVT since the left calf is slightly warm, tender when palpated, some zinging sensation, mild swelling, negative venous Doppler 10/13/20  11/07/20 slightly warmth left knee/RLE, similar mild chronic erythematous/swelling/chronic venous insufficiency skin changes BLE. Denied pain in calves. Will observe for s/s of cellulitis/DVT. Continue Furosemide.

## 2020-11-07 NOTE — Assessment & Plan Note (Signed)
on Memantine '10mg'$  bid.

## 2020-11-07 NOTE — Assessment & Plan Note (Signed)
A closed nondisplaced transverse fracture left patella 04/26/20, f/u Ortho s/p  ORIF.  prn Oxycodone for pain. WBAT, in brace, working with therapy.

## 2020-11-07 NOTE — Progress Notes (Signed)
Location:   [place] Room Number: [place] of Service:  SNF (31) Provider:   Otho Darner, NP    Patient Care Team: [name], MD as PCP - General (Internal Medicine) [name], MD as Consulting Physician (Ophthalmology) [name], MD as Consulting Physician (Cardiology) [name], MD as Consulting Physician (Dermatology) [name], MD as Consulting Physician (Orthopedic Surgery) [name], MD as Consulting Physician (Orthopedic Surgery) [name], MD as Consulting Physician (Orthopedic Surgery) [place], [place],  X, NP as Nurse Practitioner (Nurse Practitioner) [name], MD as Consulting Physician (Neurology)  Extended Emergency Contact Information Primary Emergency Contact: [name],[name] Address: [address]          [place], [place] [address] [name] of [place] Phone: [phone] Relation: Sister Secondary Emergency Contact: [name] [place] of [place] Phone: [phone] Mobile Phone: [phone] Relation: Sister  Code Status:  DNR Goals of care: Advanced Directive information Advanced Directives 11/07/2020  Does Patient Have a Medical Advance Directive? Yes  Type of Advance Directive Out of facility DNR (pink MOST or yellow form)  Does patient want to make changes to medical advance directive? No - Patient declined  Copy of [place] in Chart? -  Pre-existing out of facility DNR order (yellow form or pink MOST form) Yellow form placed in chart (order not valid for inpatient use);Pink MOST form placed in chart (order not valid for inpatient use)     Chief Complaint  Patient presents with   Acute Visit    Patient complains of swelling and redness in left leg     HPI:  Pt is a 87 y.o. female seen today for an acute visit for slightly warmth left knee/RLE, similar mild chronic erythematous/swelling/chronic venous  insufficiency skin changes BLE. Denied pain in calves.    COVID infection, s/p Paxlovid, recovered.             A closed nondisplaced transverse fracture left patella 04/26/20, f/u Ortho s/p  ORIF.  prn Oxycodone for pain. WBAT, in brace, working with therapy.              10/11/20 C/o ? DVT since the left calf is slightly warm, tender when palpated, some zinging sensation, mild swelling, negative venous Doppler 10/13/20              GERD ST eval significant reflux for several weeks results of mucus production, no observed. oropharyngeal dysphagia. Takes  Omeprazole. Hgb 14.2 06/08/20, improved.              Restless leg symptom, takes MiraPex 0.32m hs             Hx of dementia,  on Memantine 124mbid.             Hx of seizures, stable, on Keppra 50068mhs, 250m101m, Lamictal 150mg55m.             Edema, trace, on Furosemide,  Bun/creat 17/0.99 eGFR 60 08/03/20             OA in general, on Tylenol, Norco             HTN, takes Amlodipine. Bun/creat 17/1.0 eGFR 52 08/04/20             COPD, takes Spiriva qd, prn Albuterol HFA  Constipation, takes Senokot, MiraLax.              OP, stopped Foxamax per her request.  Past Medical History:  Diagnosis Date   Acute bronchitis 05/23/2011   Acute upper respiratory infections of unspecified site 05/23/2011   Arthritis    Chronic airway obstruction, not elsewhere classified 05/23/2011   Disturbance of salivary secretion 01/31/2011   Dizziness and giddiness 01/31/2011   Dyspnea    Essential tremor 04/25/2014   External hemorrhoids without mention of complication 00/93/8182   Gait disorder 04/25/2014   Insomnia, unspecified 09/12/2011   Lumbago 01/31/2011   Major depressive disorder, single episode, unspecified 01/31/2011   Memory disorder 04/25/2014   Mitral valve disorders(424.0) 01/31/2011   Other and unspecified hyperlipidemia 01/31/2011   Other convulsions 01/31/2011   Other emphysema ([place]) 01/31/2011   Pain in joint, site unspecified  01/31/2011   Restless legs syndrome (RLS) 09/12/2011   Retinal detachment with retinal defect of right eye 2011   right eye twice   Seizure disorder ([place])    Senile osteoporosis 01/31/2011   Spontaneous ecchymoses 01/31/2011   Stiffness of joints, not elsewhere classified, multiple sites 01/31/2011   Unspecified essential hypertension 01/31/2011   Past Surgical History:  Procedure Laterality Date   ABDOMINAL HYSTERECTOMY  06/21/2003   TAH/BSO, omenectomy PSB resect, Stg IC cystadenofibroma   CHOLECYSTECTOMY  2005   Dr. Marlou Starks   ELBOW SURGERY Right 2008   broken   Dr. Apolonio Schneiders   EYE SURGERY     ORIF PATELLA Left 05/02/2020   Procedure: OPEN REDUCTION INTERNAL (ORIF) FIXATION LEFT PATELLA WITH MEDIAL AND LATERAL LIGAMENT REINFORCEMENTS;  Surgeon: [name], MD;  Location: [facility];  Service: Orthopedics;  Laterality: Left;   ORIF PATELLA Left 05/30/2020   Procedure: OPEN REDUCTION INTERNAL (ORIF) FIXATION PATELLA;  Surgeon: [name], MD;  Location: [facility];  Service: Orthopedics;  Laterality: Left;   RETINAL DETACHMENT SURGERY N/A    two   REVERSE SHOULDER ARTHROPLASTY Left 05/06/2019   Procedure: REVERSE SHOULDER ARTHROPLASTY;  Surgeon: Justice Britain, MD;  Location: [facility];  Service: Orthopedics;  Laterality: Left;  125mn   ROTATOR CUFF REPAIR Right 2012   Dr. CTheda Sers  SQUAMOUS CELL CARCINOMA EXCISION Bilateral 2012, 8/14   [place] on legs   Dr. GSarajane Jews  TONSILLECTOMY  1941   VIDEO BRONCHOSCOPY WITH ENDOBRONCHIAL NAVIGATION N/A 11/29/2015   Procedure: VIDEO BRONCHOSCOPY WITH ENDOBRONCHIAL NAVIGATION;  Surgeon: RCollene Gobble MD;  Location: [facility];  Service: Thoracic;  Laterality: N/A;    Allergies  Allergen Reactions   Dyazide [Hydrochlorothiazide W-Triamterene]     Other reaction(s): her blood pressure too much   Latex Other (See Comments)    Swelling   Other     Other reaction(s): local red reaction    Rofecoxib     Other reaction(s): shortness of breath   Sulfa  Antibiotics Nausea And Vomiting   Tetracycline Hcl     Other reaction(s): Cant take due to a drug interation    Allergies as of 11/06/2020       Reactions   Dyazide [hydrochlorothiazide W-triamterene]    Other reaction(s): her blood pressure too much   Latex Other (See Comments)   Swelling   Other    Other reaction(s): local red reaction   Rofecoxib    Other reaction(s): shortness of breath   Sulfa Antibiotics Nausea And Vomiting   Tetracycline Hcl    Other reaction(s): Cant take due to a drug interation  Medication List        Accurate as of November 06, 2020 11:59 PM. If you have any questions, ask your nurse or doctor.          acetaminophen 500 MG tablet Commonly known as: TYLENOL Take 2 tablets (1,000 mg total) by mouth every 6 (six) hours as needed.   amLODipine 2.5 MG tablet Commonly known as: NORVASC Take 2.5 mg by mouth daily.   aspirin 81 MG chewable tablet Chew 81 mg by mouth daily.   CeraVe Crea Apply 1 application topically daily. Mix cream with Triamcinolone   cholecalciferol 1000 units tablet Commonly known as: VITAMIN D Take 1 tablet (1,000 Units total) by mouth daily.   furosemide 20 MG tablet Commonly known as: LASIX Take 20 mg by mouth daily.   guaiFENesin 600 MG 12 hr tablet Commonly known as: MUCINEX Take 600 mg by mouth as needed.   hydrocortisone 2.5 % lotion Apply 1 application topically daily as needed (psoriasis).   ketoconazole 2 % cream Commonly known as: NIZORAL Apply 1 application topically as needed (for psoriasis).   lamoTRIgine 150 MG tablet Commonly known as: LAMICTAL Take 150 mg by mouth 2 (two) times daily.   levETIRAcetam 500 MG tablet Commonly known as: KEPPRA Take 500 mg by mouth at bedtime.   levETIRAcetam 250 MG tablet Commonly known as: KEPPRA Take 250 mg by mouth daily. In the morning   memantine 10 MG tablet Commonly known as: NAMENDA TAKE 1 TABLET BY MOUTH TWICE DAILY.   nystatin  powder Commonly known as: MYCOSTATIN/NYSTOP Apply 1 application topically 2 (two) times daily as needed (tinea cruris).   oxyCODONE 5 MG immediate release tablet Commonly known as: Oxy IR/ROXICODONE Take 1 tablet (5 mg total) by mouth every 6 (six) hours as needed for severe pain.   pantoprazole 40 MG tablet Commonly known as: PROTONIX Take 40 mg by mouth daily.   polyethylene glycol 17 g packet Commonly known as: MIRALAX / GLYCOLAX Take 17 g by mouth daily.   Potassium Chloride ER 20 MEQ Tbcr Take 20 mEq by mouth daily.   pramipexole 0.25 MG tablet Commonly known as: MIRAPEX Take 0.5 mg by mouth at bedtime.   Robitussin Cough+ Chest Max St 10-200 MG/5ML Liqd Generic drug: Dextromethorphan-guaiFENesin Take 5 mLs by mouth daily as needed.   sennosides-docusate sodium 8.6-50 MG tablet Commonly known as: SENOKOT-S Take 2 tablets by mouth at bedtime.   simvastatin 10 MG tablet Commonly known as: ZOCOR Take 10 mg by mouth at bedtime.   tiotropium 18 MCG inhalation capsule Commonly known as: Spiriva HandiHaler INHALE CONTENTS OF ONE CAPSULE ONCE DAILY FOR COPD.   triamcinolone cream 0.1 % Commonly known as: KENALOG Apply 1 application topically daily. Mix with cerave        Review of Systems  Constitutional:  Negative for appetite change, fatigue and fever.  HENT:  Positive for congestion and hearing loss. Negative for trouble swallowing.   Eyes:  Negative for visual disturbance.  Respiratory:  Negative for cough.        DOE is chronic  Cardiovascular:  Positive for leg swelling. Negative for chest pain and palpitations.  Gastrointestinal:  Negative for abdominal pain and constipation.       Acid reflux symptoms.   Genitourinary:  Negative for dysuria and urgency.  Musculoskeletal:  Positive for arthralgias, gait problem and joint swelling.       ORIF of the left patella.   Skin:  Negative for color change.  BLE discoloration, mild erythema BLE and left  knee, left knee/LLE is slightly warmer than the RLE  Neurological:  Positive for seizures. Negative for speech difficulty, weakness and headaches.       Memory lapses. Hx of seizures. RLS  Psychiatric/Behavioral:  Negative for confusion and sleep disturbance. The patient is not nervous/anxious.    Immunization History  Administered Date(s) Administered   Influenza Split 01/06/2014, 01/15/2017, 01/08/2018, 12/09/2018   Influenza Whole 01/07/2012, 01/06/2013   Influenza, High Dose Seasonal PF 12/25/2015, 01/20/2017   Influenza,inj,Quad PF,6+ Mos 12/21/2014   Influenza-Unspecified 12/09/2018, 01/18/2020   Moderna Sars-Covid-2 Vaccination 04/12/2019, 06/05/2019, 02/15/2020, 09/05/2020   Pneumococcal Conjugate-13 02/01/2014   Pneumococcal Polysaccharide-23 12/20/1992, 01/15/2000, 04/08/2004, 06/08/2004   Td 04/08/2002, 04/22/2002   Tdap 04/09/2011, 08/17/2015   Zoster Recombinat (Shingrix) 05/29/2005, 08/27/2017   Zoster, Live 04/08/2008, 05/29/2014, 05/28/2017   Zoster, Unspecified 05/29/2005   Pertinent  Health Maintenance Due  Topic Date Due   INFLUENZA VACCINE  11/06/2020   DEXA SCAN  Completed   PNA vac Low Risk Adult  Completed   MAMMOGRAM  Discontinued   Fall Risk  03/24/2014 10/15/2012  Falls in the past year? No No   Functional Status Survey:    Vitals:   11/07/20 1042  BP: 120/76  Pulse: 78  Resp: 14  Temp: 97.6 F (36.4 C)  SpO2: 92%  Weight: 154 lb (69.9 kg)  Height: 5' 2" (1.575 m)   Body mass index is 28.17 kg/m. Physical Exam Vitals and nursing note reviewed.  Constitutional:      Appearance: Normal appearance.  HENT:     Head: Normocephalic and atraumatic.     Mouth/Throat:     Mouth: Mucous membranes are moist.  Eyes:     Extraocular Movements: Extraocular movements intact.     Conjunctiva/sclera:     Right eye: Right conjunctiva is not injected.     Left eye: Left conjunctiva is not injected.     Pupils: Pupils are equal, round, and reactive to  light.  Cardiovascular:     Rate and Rhythm: Normal rate and regular rhythm.     Heart sounds: Murmur heard.     Comments: PD pulses are not felt.  Pulmonary:     Effort: Pulmonary effort is normal.     Breath sounds: Rales present. No wheezing or rhonchi.     Comments: Decreased air entry to both lungs. Bibasilar rales.  Abdominal:     General: Bowel sounds are normal.     Palpations: Abdomen is soft.     Tenderness: There is no abdominal tenderness.     Comments: Mid abd surgical scar  Musculoskeletal:     Cervical back: Normal range of motion and neck supple.     Right lower leg: Edema present.     Left lower leg: Edema present.     Comments:  Decreased overhead ROM of the left shoulder. BLE edema trace  is chronic. Left knee s/p ORIF of the patella.  BLE discoloration, mild erythema BLE and left knee, left knee/LLE is slightly warmer than the RLE  Skin:    General: Skin is warm and dry.     Comments: Brownish skin discoloration BLE.   Neurological:     General: No focal deficit present.     Mental Status: She is alert and oriented to person, place, and time. Mental status is at baseline.     Gait: Gait abnormal.  Psychiatric:        Mood and Affect:  Mood normal.        Behavior: Behavior normal.        Thought Content: Thought content normal.    Labs reviewed: Recent Labs    05/02/20 0630 05/26/20 0935 06/08/20 0000 06/13/20 0000 08/04/20 1205  NA 140 141 143 142 142  K 3.5 3.9 4.1 3.9 4.6  CL 104 104 105 108 104  CO2 25 26 31* 24* 27*  GLUCOSE 99 86  --   --   --   BUN 25* 27* 24* 22* 17  CREATININE 0.97 0.94 1.0 0.9 1.0  CALCIUM 9.2 9.5 9.4 9.6 10.0   Recent Labs    01/11/20 0000 06/08/20 0000  AST 16 16  ALT 11 9  ALKPHOS 109 138*  ALBUMIN 4.5 3.9   Recent Labs    01/11/20 0000 05/02/20 0630 05/26/20 0935 06/08/20 0000  WBC 5.1 6.3 5.7 5.5  NEUTROABS 2,953.00  --   --  3,515.00  HGB 14.5 13.6 14.7 14.2  HCT 44 42.6 46.5* 43  MCV  --  89.7  91.7  --   PLT 249 223 253 247   Lab Results  Component Value Date   TSH 1.79 06/29/2019   No results found for: HGBA1C Lab Results  Component Value Date   CHOL 164 10/12/2019   HDL 67 10/12/2019   LDLCALC 82 10/12/2019   TRIG 70 10/12/2019   CHOLHDL 3.6 06/27/2015    Significant Diagnostic Results in last 30 days:  No results found.  Assessment/Plan Edema 10/11/20 C/o ? DVT since the left calf is slightly warm, tender when palpated, some zinging sensation, mild swelling, negative venous Doppler 10/13/20  11/07/20 slightly warmth left knee/RLE, similar mild chronic erythematous/swelling/chronic venous insufficiency skin changes BLE. Denied pain in calves. Will observe for s/s of cellulitis/DVT. Continue Furosemide.   Nondisplaced transverse fracture of left patella, subsequent encounter for closed fracture with nonunion A closed nondisplaced transverse fracture left patella 04/26/20, f/u Ortho s/p  ORIF.  prn Oxycodone for pain. WBAT, in brace, working with therapy.   COVID-19 virus infection COVID infection, s/p Paxlovid, recovered.  GERD (gastroesophageal reflux disease) ST eval significant reflux for several weeks results of mucus production, no observed. oropharyngeal dysphagia. Takes  Omeprazole. Hgb 14.2 06/08/20, improved.   Restless legs syndrome (RLS) Restless leg symptom, takes MiraPex 0.21m hs  Senile dementia (HCC) on Memantine 158mbid.  Seizure disorder (HCC) stable, on Keppra 50023mhs, 250m30m, Lamictal 150mg83m.  Essential hypertension Mildly elevated upon my examination, the patient denied HA, change of vision, chest pain/pressure, palpitation, SOB, or generalized malaise, takes Amlodipine. Bun/creat 17/1.0 eGFR 52 08/04/20. VS qshift x 72 hours.   COPD (chronic obstructive pulmonary disease) (HCC) takes Spiriva qd, prn Albuterol HFA  Slow transit constipation takes Senokot, MiraLax.   Senile osteoporosis stopped Foxamax per her request.   Family/  staff Communication: plan of care reviewed with the patient and charge nurse.   Labs/tests ordered:  none  Time spend 35 minutes.

## 2020-11-07 NOTE — Assessment & Plan Note (Signed)
ST eval significant reflux for several weeks results of mucus production, no observed. oropharyngeal dysphagia. Takes  Omeprazole. Hgb 14.2 06/08/20, improved.

## 2020-11-07 NOTE — Progress Notes (Signed)
This encounter was created in error - please disregard.

## 2020-11-08 ENCOUNTER — Encounter: Payer: Self-pay | Admitting: Nurse Practitioner

## 2020-11-21 ENCOUNTER — Non-Acute Institutional Stay (SKILLED_NURSING_FACILITY): Payer: Medicare PPO | Admitting: Internal Medicine

## 2020-11-21 ENCOUNTER — Encounter: Payer: Self-pay | Admitting: Internal Medicine

## 2020-11-21 DIAGNOSIS — G40909 Epilepsy, unspecified, not intractable, without status epilepticus: Secondary | ICD-10-CM | POA: Diagnosis not present

## 2020-11-21 DIAGNOSIS — I10 Essential (primary) hypertension: Secondary | ICD-10-CM

## 2020-11-21 DIAGNOSIS — M810 Age-related osteoporosis without current pathological fracture: Secondary | ICD-10-CM | POA: Diagnosis not present

## 2020-11-21 DIAGNOSIS — M159 Polyosteoarthritis, unspecified: Secondary | ICD-10-CM | POA: Diagnosis not present

## 2020-11-21 DIAGNOSIS — R609 Edema, unspecified: Secondary | ICD-10-CM

## 2020-11-21 DIAGNOSIS — G2581 Restless legs syndrome: Secondary | ICD-10-CM | POA: Diagnosis not present

## 2020-11-21 DIAGNOSIS — Z9889 Other specified postprocedural states: Secondary | ICD-10-CM

## 2020-11-21 NOTE — Progress Notes (Signed)
Location:   [facility] Room Number: 18 Place of Service:  SNF [id number]) Provider:  Veleta Miners MD  [name], MD  Patient Care Team: [name], MD as PCP - General (Internal Medicine) [name], MD as Consulting Physician (Ophthalmology) [name], MD as Consulting Physician (Cardiology) [name], MD as Consulting Physician (Dermatology) [name], MD as Consulting Physician (Orthopedic Surgery) [name], MD as Consulting Physician (Orthopedic Surgery) [name], MD as Consulting Physician (Orthopedic Surgery) [place], [place] Mast, Man X, NP as Nurse Practitioner (Nurse Practitioner) [name], MD as Consulting Physician (Neurology)  Extended Emergency Contact Information Primary Emergency Contact: [name],[name] Address: [address]          [place], [place] [address] [name] of [place] Phone: [phone] Relation: Sister Secondary Emergency Contact: [name] [place] of [place] Phone: [phone] Mobile Phone: [phone] Relation: Sister  Code Status:  DNR Managed Care Goals of care: Advanced Directive information Advanced Directives 11/21/2020  Does Patient Have a Medical Advance Directive? Yes  Type of Advance Directive Out of facility DNR (pink MOST or yellow form)  Does patient want to make changes to medical advance directive? No - Patient declined  Copy of [place] in Chart? -  Pre-existing out of facility DNR order (yellow form or pink MOST form) Yellow form placed in chart (order not valid for inpatient use);Pink MOST form placed in chart (order not valid for inpatient use)     Chief Complaint  Patient presents with   Medical Management of Chronic Issues   Quality Metric Gaps    Shingrix and flu    HPI:  Pt is a 87 y.o. female seen today for medical management of chronic diseases.   Patient also has  h/o COPD, Hypertension,Seizure Disorder,Mild Cognitive impairment, Hyperlipidemia Chronic Venous Changes in LE  Fall in 1/22  leading to Left Humerus Fracture with Displacement   Sustained Left Patellar Fracture due to Mechanical fall Underwent ORIF on 1/25 Went for regular Follow up with Dr Percell Miller. Repeat Xray showed that it is was not healing well and she needed revision surgery on 2/22  Lives in SNF now [facility] with her walker Getting therapy 4/ week Pain seems controlled Weight is stable Mood is good. No Acute issues  Past Medical History:  Diagnosis Date   Acute bronchitis 05/23/2011   Acute upper respiratory infections of unspecified site 05/23/2011   Arthritis    Chronic airway obstruction, not elsewhere classified 05/23/2011   Disturbance of salivary secretion 01/31/2011   Dizziness and giddiness 01/31/2011   Dyspnea    Essential tremor 04/25/2014   External hemorrhoids without mention of complication [id number]   Gait disorder 04/25/2014   Insomnia, unspecified 09/12/2011   Lumbago 01/31/2011   Major depressive disorder, single episode, unspecified 01/31/2011   Memory disorder 04/25/2014   Mitral valve disorders(424.0) 01/31/2011   Other and unspecified hyperlipidemia 01/31/2011   Other convulsions 01/31/2011   Other emphysema ([place]) 01/31/2011   Pain in joint, site unspecified 01/31/2011   Restless legs syndrome (RLS) 09/12/2011   Retinal detachment with retinal defect of right eye 2011   right eye twice   Seizure disorder ([place])    Senile osteoporosis 01/31/2011   Spontaneous ecchymoses 01/31/2011   Stiffness of joints, not elsewhere classified, multiple sites 01/31/2011   Unspecified essential hypertension 01/31/2011   Past Surgical History:  Procedure Laterality Date   ABDOMINAL HYSTERECTOMY  06/21/2003   TAH/BSO, omenectomy PSB resect, Stg IC cystadenofibroma   CHOLECYSTECTOMY  2005   Dr. Marlou Starks   ELBOW SURGERY Right 2008   broken   Dr. Apolonio Schneiders   EYE SURGERY     ORIF  PATELLA Left 05/02/2020   Procedure: OPEN REDUCTION INTERNAL (ORIF) FIXATION LEFT PATELLA WITH MEDIAL AND LATERAL LIGAMENT REINFORCEMENTS;  Surgeon: [name], MD;  Location: [facility];  Service: Orthopedics;  Laterality: Left;   ORIF PATELLA Left 05/30/2020   Procedure: OPEN REDUCTION INTERNAL (ORIF) FIXATION PATELLA;  Surgeon: [name], MD;  Location: [facility];  Service: Orthopedics;  Laterality: Left;   RETINAL DETACHMENT SURGERY N/A    two   REVERSE SHOULDER ARTHROPLASTY Left 05/06/2019   Procedure: REVERSE SHOULDER ARTHROPLASTY;  Surgeon: Justice Britain, MD;  Location: [facility];  Service: Orthopedics;  Laterality: Left;  118mn   ROTATOR CUFF REPAIR Right 2012   Dr. CTheda Sers  SQUAMOUS CELL CARCINOMA EXCISION Bilateral 2012, 8/14   [place] on legs   Dr. GSarajane Jews  TONSILLECTOMY  1941   VIDEO BRONCHOSCOPY WITH ENDOBRONCHIAL NAVIGATION N/A 11/29/2015   Procedure: VIDEO BRONCHOSCOPY WITH ENDOBRONCHIAL NAVIGATION;  Surgeon: RCollene Gobble MD;  [facility];  Service: Thoracic;  Laterality: N/A;    Allergies  Allergen Reactions   Dyazide [Hydrochlorothiazide W-Triamterene]     Other reaction(s): her blood pressure too much   Latex Other (See Comments)    Swelling   Other     Other reaction(s): local red reaction    Rofecoxib     Other reaction(s): shortness of breath   Sulfa Antibiotics Nausea And Vomiting   Tetracycline Hcl     Other reaction(s): Cant take due to a drug interation    Allergies as of 11/21/2020       Reactions   Dyazide [hydrochlorothiazide W-triamterene]    Other reaction(s): her blood pressure too much   Latex Other (See Comments)   Swelling   Other    Other reaction(s): local red reaction   Rofecoxib    Other reaction(s): shortness of breath   Sulfa Antibiotics Nausea And Vomiting   Tetracycline Hcl    Other reaction(s): Cant take due to a drug interation        Medication List        Accurate as of November 21, 2020 11:44 AM. If you have  any questions, ask your nurse or doctor.          STOP taking these medications    guaiFENesin 600 MG 12 hr tablet Commonly known as: MKittsonby: AVirgie Dad MD       TAKE these medications    acetaminophen 500 MG tablet Commonly known as: TYLENOL Take 2 tablets (1,000 mg total) by mouth every 6 (six) hours as needed.   amLODipine 2.5 MG tablet Commonly known as: NORVASC Take 2.5 mg by mouth daily.   aspirin 81 MG chewable tablet Chew 81 mg by mouth daily.   CeraVe Crea Apply 1 application topically daily. Mix cream with Triamcinolone   chlorhexidine 0.12 % solution Commonly known as: PERIDEX Use as directed 15 mLs in the mouth or throat at bedtime.   cholecalciferol 1000 units tablet Commonly known as: VITAMIN D Take 1 tablet (1,000 Units total) by mouth daily.   furosemide 20 MG tablet Commonly known as: LASIX Take 20 mg by mouth daily.   hydrocortisone 2.5 % lotion Apply 1 application topically daily  as needed (psoriasis).   ketoconazole 2 % cream Commonly known as: NIZORAL Apply 1 application topically as needed (for psoriasis).   lamoTRIgine 150 MG tablet Commonly known as: LAMICTAL Take 150 mg by mouth 2 (two) times daily.   levETIRAcetam 500 MG tablet Commonly known as: KEPPRA Take 500 mg by mouth at bedtime.   levETIRAcetam 250 MG tablet Commonly known as: KEPPRA Take 250 mg by mouth daily. In the morning   memantine 10 MG tablet Commonly known as: NAMENDA TAKE 1 TABLET BY MOUTH TWICE DAILY.   nystatin powder Commonly known as: MYCOSTATIN/NYSTOP Apply 1 application topically 2 (two) times daily as needed (tinea cruris).   oxyCODONE 5 MG immediate release tablet Commonly known as: Oxy IR/ROXICODONE Take 1 tablet (5 mg total) by mouth every 6 (six) hours as needed for severe pain.   pantoprazole 40 MG tablet Commonly known as: PROTONIX Take 40 mg by mouth daily.   polyethylene glycol 17 g packet Commonly known as:  MIRALAX / GLYCOLAX Take 17 g by mouth daily.   Potassium Chloride ER 20 MEQ Tbcr Take 20 mEq by mouth daily.   pramipexole 0.25 MG tablet Commonly known as: MIRAPEX Take 0.5 mg by mouth at bedtime.   pyrithione zinc 1 % shampoo Commonly known as: HEAD AND SHOULDERS Apply topically. Monday and Thursday   Robitussin Cough+ Chest Max St 10-200 MG/5ML Liqd Generic drug: Dextromethorphan-guaiFENesin Take 5 mLs by mouth daily as needed.   sennosides-docusate sodium 8.6-50 MG tablet Commonly known as: SENOKOT-S Take 2 tablets by mouth at bedtime.   simvastatin 10 MG tablet Commonly known as: ZOCOR Take 10 mg by mouth at bedtime.   tiotropium 18 MCG inhalation capsule Commonly known as: Spiriva HandiHaler INHALE CONTENTS OF ONE CAPSULE ONCE DAILY FOR COPD.   triamcinolone cream 0.1 % Commonly known as: KENALOG Apply 1 application topically daily. Mix with cerave        Review of Systems Review of Systems  Constitutional: Negative for activity change, appetite change, chills, diaphoresis, fatigue and fever.  HENT: Negative for mouth sores, postnasal drip, rhinorrhea, sinus pain and sore throat.   Respiratory: Negative for apnea, cough, chest tightness, shortness of breath and wheezing.   Cardiovascular: Negative for chest pain, palpitations  Gastrointestinal: Negative for abdominal distention, abdominal pain, constipation, diarrhea, nausea and vomiting.  Genitourinary: Negative for dysuria and frequency.  Musculoskeletal: Negative for arthralgias, joint swelling and myalgias.  Skin: Negative for rash.  Neurological: Negative for dizziness, syncope, weakness, light-headedness and numbness.  Psychiatric/Behavioral: Negative for behavioral problems, confusion and sleep disturbance.    Immunization History  Administered Date(s) Administered   Influenza Split 01/06/2014, 01/15/2017, 01/08/2018, 12/09/2018   Influenza Whole 01/07/2012, 01/06/2013   Influenza, High Dose  Seasonal PF 12/25/2015, 01/20/2017   Influenza,inj,Quad PF,6+ Mos 12/21/2014   Influenza-Unspecified 12/09/2018, 01/18/2020   Moderna Sars-Covid-2 Vaccination 04/12/2019, 06/05/2019, 02/15/2020, 09/05/2020   Pneumococcal Conjugate-13 02/01/2014   Pneumococcal Polysaccharide-23 12/20/1992, 01/15/2000, 04/08/2004, 06/08/2004   Td 04/08/2002, 04/22/2002   Tdap 04/09/2011, 08/17/2015   Zoster Recombinat (Shingrix) 05/29/2005, 08/27/2017   Zoster, Live 04/08/2008, 05/29/2014, 05/28/2017   Zoster, Unspecified 05/29/2005   Pertinent  Health Maintenance Due  Topic Date Due   INFLUENZA VACCINE  11/06/2020   DEXA SCAN  Completed   PNA vac Low Risk Adult  Completed   MAMMOGRAM  Discontinued   Fall Risk  03/24/2014 10/15/2012  Falls in the past year? No No   Functional Status Survey:    Vitals:   11/21/20 1127  BP: 110/68  Pulse: 78  Resp: 15  Temp: (!) 97.3 F (36.3 C)  SpO2: 94%  Weight: 154 lb 6.4 oz (70 kg)  Height: '5\' 2"'$  (1.575 m)   Body mass index is 28.24 kg/m. Physical Exam Constitutional: Oriented to person, place, and time. Well-developed and well-nourished.  HENT:  Head: Normocephalic.  Mouth/Throat: Oropharynx is clear and moist.  Eyes: Pupils are equal, round, and reactive to light.  Neck: Neck supple.  Cardiovascular: Normal rate and normal heart sounds.  No murmur heard. Pulmonary/Chest: Effort normal and breath sounds normal. No respiratory distress. No wheezes. She has no rales.  Abdominal: Soft. Bowel sounds are normal. No distension. There is no tenderness. There is no rebound.  Musculoskeletal: Chronic Edema Bilateral with Venous changes.  Walks with her walker Lymphadenopathy: none Neurological: Alert and oriented to person, place, and time.  Skin: Skin is warm and dry.  Psychiatric: Normal mood and affect. Behavior is normal. Thought content normal.   Labs reviewed: Recent Labs    05/02/20 0630 05/26/20 0935 06/08/20 0000 06/13/20 0000  08/04/20 1205  NA 140 141 143 142 142  K 3.5 3.9 4.1 3.9 4.6  CL 104 104 105 108 104  CO2 25 26 31* 24* 27*  GLUCOSE 99 86  --   --   --   BUN 25* 27* 24* 22* 17  CREATININE 0.97 0.94 1.0 0.9 1.0  CALCIUM 9.2 9.5 9.4 9.6 10.0   Recent Labs    01/11/20 0000 06/08/20 0000  AST 16 16  ALT 11 9  ALKPHOS 109 138*  ALBUMIN 4.5 3.9   Recent Labs    01/11/20 0000 05/02/20 0630 05/26/20 0935 06/08/20 0000  WBC 5.1 6.3 5.7 5.5  NEUTROABS 2,953.00  --   --  3,515.00  HGB 14.5 13.6 14.7 14.2  HCT 44 42.6 46.5* 43  MCV  --  89.7 91.7  --   PLT 249 223 253 247   Lab Results  Component Value Date   TSH 1.79 06/29/2019   No results found for: HGBA1C Lab Results  Component Value Date   CHOL 164 10/12/2019   HDL 67 10/12/2019   LDLCALC 82 10/12/2019   TRIG 70 10/12/2019   CHOLHDL 3.6 06/27/2015    Significant Diagnostic Results in last 30 days:  No results found.  Assessment/Plan Edema, unspecified type Looks at her baseline On Lasix Essential hypertension BP stable on Norvasc Seizure disorder (HCC) Continue on Kepprra and Lamictal s/p ORIF of Patella Doing well Pain Controlled Walking well with her walker  Senile osteoporosis Was on Fosmax took herself of it and does not want to take anything anymore  Generalized osteoarthritis of multiple sites On Oxycodone PRN  Restless legs syndrome (RLS) On Mirapex  Senile dementia without behavioral disturbance (HCC) Highly Functional On NAmenda HLD On Statin   Other emphysema ([place]) On Spiriva   Family/ staff Communication:   Labs/tests ordered:

## 2020-11-23 DIAGNOSIS — E785 Hyperlipidemia, unspecified: Secondary | ICD-10-CM | POA: Diagnosis not present

## 2020-11-23 LAB — LIPID PANEL
Cholesterol: 169 (ref 0–200)
HDL: 64 (ref 35–70)
LDL Cholesterol: 88
LDl/HDL Ratio: 2.6
Triglycerides: 80 (ref 40–160)

## 2020-12-04 ENCOUNTER — Encounter: Payer: Self-pay | Admitting: Nurse Practitioner

## 2020-12-04 ENCOUNTER — Non-Acute Institutional Stay (SKILLED_NURSING_FACILITY): Payer: Medicare PPO | Admitting: Nurse Practitioner

## 2020-12-04 DIAGNOSIS — U071 COVID-19: Secondary | ICD-10-CM

## 2020-12-04 DIAGNOSIS — G2581 Restless legs syndrome: Secondary | ICD-10-CM | POA: Diagnosis not present

## 2020-12-04 DIAGNOSIS — G40909 Epilepsy, unspecified, not intractable, without status epilepticus: Secondary | ICD-10-CM | POA: Diagnosis not present

## 2020-12-04 DIAGNOSIS — I10 Essential (primary) hypertension: Secondary | ICD-10-CM | POA: Diagnosis not present

## 2020-12-04 DIAGNOSIS — J431 Panlobular emphysema: Secondary | ICD-10-CM

## 2020-12-04 DIAGNOSIS — M810 Age-related osteoporosis without current pathological fracture: Secondary | ICD-10-CM

## 2020-12-04 DIAGNOSIS — K5901 Slow transit constipation: Secondary | ICD-10-CM

## 2020-12-04 DIAGNOSIS — F0390 Unspecified dementia without behavioral disturbance: Secondary | ICD-10-CM | POA: Diagnosis not present

## 2020-12-04 DIAGNOSIS — R609 Edema, unspecified: Secondary | ICD-10-CM

## 2020-12-04 DIAGNOSIS — W19XXXA Unspecified fall, initial encounter: Secondary | ICD-10-CM

## 2020-12-04 DIAGNOSIS — K219 Gastro-esophageal reflux disease without esophagitis: Secondary | ICD-10-CM

## 2020-12-04 DIAGNOSIS — M159 Polyosteoarthritis, unspecified: Secondary | ICD-10-CM

## 2020-12-04 NOTE — Assessment & Plan Note (Signed)
takes Amlodipine. Bun/creat 17/1.0 eGFR 52 08/04/20

## 2020-12-04 NOTE — Assessment & Plan Note (Signed)
A closed nondisplaced transverse fracture left patella 04/26/20, f/u Ortho s/p  ORIF.  prn Oxycodone for pain. WBAT, in brace, working with therapy, prn Tylenol, Oxycodone.

## 2020-12-04 NOTE — Assessment & Plan Note (Signed)
stable, on Keppra 500mg  qhs, 250mg  qd, Lamictal 150mg  bid.

## 2020-12-04 NOTE — Assessment & Plan Note (Signed)
takes Senokot, MiraLax.

## 2020-12-04 NOTE — Assessment & Plan Note (Signed)
trace, on Furosemide,  Bun/creat 17/0.99 eGFR 60 08/03/20 

## 2020-12-04 NOTE — Assessment & Plan Note (Signed)
stopped Foxamax per her request.

## 2020-12-04 NOTE — Assessment & Plan Note (Signed)
ST eval significant reflux for several weeks results of mucus production, no observed. oropharyngeal dysphagia. Takes  Omeprazole. Hgb 14.2 06/08/20, improved.

## 2020-12-04 NOTE — Assessment & Plan Note (Signed)
the patient was found in the floor in front of her recliner. Resulted left fore arm skin tear, steri strips intact. Some soreness left lateral leg. Also c/o left adam apple soreness, denied new cough, sore throat, or chest pain. Denied X-ray evaluation today.

## 2020-12-04 NOTE — Progress Notes (Signed)
Location:   [facility] Room Number: [place] of Service:  SNF [phone]) Provider:  Audrinna Sherman X Alexandra Lipps,NP  [name], MD  Patient Care Team: [name], MD as PCP - General (Internal Medicine) [name], MD as Consulting Physician (Ophthalmology) [name], MD as Consulting Physician (Cardiology) [name], MD as Consulting Physician (Dermatology) [name], MD as Consulting Physician (Orthopedic Surgery) [name], MD as Consulting Physician (Orthopedic Surgery) [name], MD as Consulting Physician (Orthopedic Surgery) Sells, Sunshine, Charlese Gruetzmacher X, NP as Nurse Practitioner (Nurse Practitioner) [name], MD as Consulting Physician (Neurology)  Extended Emergency Contact Information Primary Emergency Contact: [name],[name] Address: [address]          [place], [place] [address] [name] of [place] Phone: [phone] Relation: Sister Secondary Emergency Contact: [name] [place] of [place] Phone: [phone] Mobile Phone: [phone] Relation: Sister  Code Status:  DNR Goals of care: Advanced Directive information Advanced Directives 12/04/2020  Does Patient Have a Medical Advance Directive? Yes  Type of Advance Directive Out of facility DNR (pink MOST or yellow form)  Does patient want to make changes to medical advance directive? No - Patient declined  Copy of [place] in Chart? -  Pre-existing out of facility DNR order (yellow form or pink MOST form) Yellow form placed in chart (order not valid for inpatient use);Pink MOST form placed in chart (order not valid for inpatient use)     Chief Complaint  Patient presents with   Acute Visit    Patient presents after a fall    HPI:  Pt is a 87 y.o. female seen today for an acute visit for the patient was found in the floor in front of her recliner. Resulted left fore arm skin tear,  steri strips intact. Some soreness left lateral leg. Also c/o left adam apple soreness, denied new cough, sore throat, or chest pain. Denied X-ray evaluation today.    COVID infection, s/p Paxlovid, recovered.             A closed nondisplaced transverse fracture left patella 04/26/20, f/u Ortho s/p  ORIF.  prn Oxycodone for pain. WBAT, in brace, working with therapy             GERD ST eval significant reflux for several weeks results of mucus production, no observed. oropharyngeal dysphagia. Takes  Omeprazole. Hgb 14.2 06/08/20, improved.              Restless leg symptom, takes MiraPex 0.$RemoveBeforeD'5mg'kRUDIJgANDFotJ$  hs             Hx of dementia,  on Memantine $RemoveBefo'10mg'IlatjJhZZnP$  bid.             Hx of seizures, stable, on Keppra $RemoveB'500mg'SCRkTSiX$  qhs, $Rem'250mg'zqma$  qd, Lamictal $RemoveBefo'150mg'MZLeFUkopvO$  bid.             Edema, trace, on Furosemide,  Bun/creat 17/0.99 eGFR 60 08/03/20             OA in general, on Tylenol, Norco             HTN, takes Amlodipine. Bun/creat 17/1.0 eGFR 52 08/04/20             COPD, takes Spiriva qd, prn Albuterol HFA             Constipation, takes Senokot, MiraLax.  OP, stopped Foxamax per her request.    Past Medical History:  Diagnosis Date   Acute bronchitis 05/23/2011   Acute upper respiratory infections of unspecified site 05/23/2011   Arthritis    Chronic airway obstruction, not elsewhere classified 05/23/2011   Disturbance of salivary secretion 01/31/2011   Dizziness and giddiness 01/31/2011   Dyspnea    Essential tremor 04/25/2014   External hemorrhoids without mention of complication 02/58/5277   Gait disorder 04/25/2014   Insomnia, unspecified 09/12/2011   Lumbago 01/31/2011   Major depressive disorder, single episode, unspecified 01/31/2011   Memory disorder 04/25/2014   Mitral valve disorders(424.0) 01/31/2011   Other and unspecified hyperlipidemia 01/31/2011   Other convulsions 01/31/2011   Other emphysema ([place]) 01/31/2011   Pain in joint, site unspecified 01/31/2011   Restless legs syndrome (RLS) 09/12/2011    Retinal detachment with retinal defect of right eye 2011   right eye twice   Seizure disorder ([place])    Senile osteoporosis 01/31/2011   Spontaneous ecchymoses 01/31/2011   Stiffness of joints, not elsewhere classified, multiple sites 01/31/2011   Unspecified essential hypertension 01/31/2011   Past Surgical History:  Procedure Laterality Date   ABDOMINAL HYSTERECTOMY  06/21/2003   TAH/BSO, omenectomy PSB resect, Stg IC cystadenofibroma   CHOLECYSTECTOMY  2005   Dr. Marlou Starks   ELBOW SURGERY Right 2008   broken   Dr. Apolonio Schneiders   EYE SURGERY     ORIF PATELLA Left 05/02/2020   Procedure: OPEN REDUCTION INTERNAL (ORIF) FIXATION LEFT PATELLA WITH MEDIAL AND LATERAL LIGAMENT REINFORCEMENTS;  Surgeon: [name], MD;  Location: [facility];  Service: Orthopedics;  Laterality: Left;   ORIF PATELLA Left 05/30/2020   Procedure: OPEN REDUCTION INTERNAL (ORIF) FIXATION PATELLA;  Surgeon: [name], MD;  Location: [facility];  Service: Orthopedics;  Laterality: Left;   RETINAL DETACHMENT SURGERY N/A    two   REVERSE SHOULDER ARTHROPLASTY Left 05/06/2019   Procedure: REVERSE SHOULDER ARTHROPLASTY;  Surgeon: Justice Britain, MD;  Location: [facility];  Service: Orthopedics;  Laterality: Left;  133min   ROTATOR CUFF REPAIR Right 2012   Dr. Theda Sers   SQUAMOUS CELL CARCINOMA EXCISION Bilateral 2012, 8/14   [place] on legs   Dr. Sarajane Jews   TONSILLECTOMY  1941   VIDEO BRONCHOSCOPY WITH ENDOBRONCHIAL NAVIGATION N/A 11/29/2015   Procedure: VIDEO BRONCHOSCOPY WITH ENDOBRONCHIAL NAVIGATION;  Surgeon: Collene Gobble, MD;  Location: [facility];  Service: Thoracic;  Laterality: N/A;    Allergies  Allergen Reactions   Dyazide [Hydrochlorothiazide W-Triamterene]     Other reaction(s): her blood pressure too much   Latex Other (See Comments)    Swelling   Other     Other reaction(s): local red reaction    Rofecoxib     Other reaction(s): shortness of breath   Sulfa Antibiotics Nausea And Vomiting   Tetracycline Hcl      Other reaction(s): Cant take due to a drug interation    Allergies as of 12/04/2020       Reactions   Dyazide [hydrochlorothiazide W-triamterene]    Other reaction(s): her blood pressure too much   Latex Other (See Comments)   Swelling   Other    Other reaction(s): local red reaction   Rofecoxib    Other reaction(s): shortness of breath   Sulfa Antibiotics Nausea And Vomiting   Tetracycline Hcl    Other reaction(s): Cant take due to a drug interation        Medication List  Accurate as of December 04, 2020 11:59 PM. If you have any questions, ask your nurse or doctor.          STOP taking these medications    pyrithione zinc 1 % shampoo Commonly known as: HEAD AND SHOULDERS Stopped by: Seve Monette X Nikeia Henkes, NP       TAKE these medications    acetaminophen 500 MG tablet Commonly known as: TYLENOL Take 2 tablets (1,000 mg total) by mouth every 6 (six) hours as needed.   amLODipine 2.5 MG tablet Commonly known as: NORVASC Take 2.5 mg by mouth daily.   aspirin 81 MG chewable tablet Chew 81 mg by mouth daily.   CeraVe Crea Apply 1 application topically daily. Mix cream with Triamcinolone   chlorhexidine 0.12 % solution Commonly known as: PERIDEX Use as directed 15 mLs in the mouth or throat at bedtime.   cholecalciferol 1000 units tablet Commonly known as: VITAMIN D Take 1 tablet (1,000 Units total) by mouth daily.   furosemide 20 MG tablet Commonly known as: LASIX Take 20 mg by mouth daily.   hydrocortisone 2.5 % lotion Apply 1 application topically daily as needed (psoriasis).   ketoconazole 2 % cream Commonly known as: NIZORAL Apply 1 application topically as needed (for psoriasis).   lamoTRIgine 150 MG tablet Commonly known as: LAMICTAL Take 150 mg by mouth 2 (two) times daily.   levETIRAcetam 500 MG tablet Commonly known as: KEPPRA Take 500 mg by mouth at bedtime.   levETIRAcetam 250 MG tablet Commonly known as: KEPPRA Take 250 mg by mouth  daily. In the morning   memantine 10 MG tablet Commonly known as: NAMENDA TAKE 1 TABLET BY MOUTH TWICE DAILY.   nystatin powder Commonly known as: MYCOSTATIN/NYSTOP Apply 1 application topically 2 (two) times daily as needed (tinea cruris).   oxyCODONE 5 MG immediate release tablet Commonly known as: Oxy IR/ROXICODONE Take 1 tablet (5 mg total) by mouth every 6 (six) hours as needed for severe pain.   pantoprazole 40 MG tablet Commonly known as: PROTONIX Take 40 mg by mouth daily.   polyethylene glycol 17 g packet Commonly known as: MIRALAX / GLYCOLAX Take 17 g by mouth daily.   Potassium Chloride ER 20 MEQ Tbcr Take 20 mEq by mouth daily.   pramipexole 0.25 MG tablet Commonly known as: MIRAPEX Take 0.5 mg by mouth at bedtime.   Robitussin Cough+ Chest Max St 10-200 MG/5ML Liqd Generic drug: Dextromethorphan-guaiFENesin Take 5 mLs by mouth daily as needed.   sennosides-docusate sodium 8.6-50 MG tablet Commonly known as: SENOKOT-S Take 2 tablets by mouth at bedtime.   simvastatin 10 MG tablet Commonly known as: ZOCOR Take 10 mg by mouth at bedtime.   tiotropium 18 MCG inhalation capsule Commonly known as: Spiriva HandiHaler INHALE CONTENTS OF ONE CAPSULE ONCE DAILY FOR COPD.   triamcinolone cream 0.1 % Commonly known as: KENALOG Apply 1 application topically daily. Mix with cerave        Review of Systems  Constitutional:  Negative for appetite change, fatigue and fever.  HENT:  Positive for hearing loss. Negative for trouble swallowing.   Eyes:  Negative for visual disturbance.  Respiratory:  Negative for cough.        DOE is chronic  Cardiovascular:  Positive for leg swelling. Negative for chest pain and palpitations.  Gastrointestinal:  Negative for abdominal pain and constipation.       Acid reflux symptoms.   Genitourinary:  Negative for dysuria and urgency.  Musculoskeletal:  Positive  for arthralgias, gait problem and joint swelling.       ORIF of  the left patella. Lateral side left leg sore, left adam's apple sore  Skin:  Negative for color change.       BLE discoloration, mild erythema BLE and left knee, left knee/LLE is slightly warmer than the RLE  Neurological:  Positive for seizures. Negative for speech difficulty, weakness and headaches.       Memory lapses. Hx of seizures. RLS  Psychiatric/Behavioral:  Negative for confusion and sleep disturbance. The patient is not nervous/anxious.    Immunization History  Administered Date(s) Administered   Influenza Split 01/06/2014, 01/15/2017, 01/08/2018, 12/09/2018   Influenza Whole 01/07/2012, 01/06/2013   Influenza, High Dose Seasonal PF 12/25/2015, 01/20/2017   Influenza,inj,Quad PF,6+ Mos 12/21/2014   Influenza-Unspecified 12/09/2018, 01/18/2020   Moderna Sars-Covid-2 Vaccination 04/12/2019, 06/05/2019, 02/15/2020, 09/05/2020   Pneumococcal Conjugate-13 02/01/2014   Pneumococcal Polysaccharide-23 12/20/1992, 01/15/2000, 04/08/2004, 06/08/2004   Td 04/08/2002, 04/22/2002   Tdap 04/09/2011, 08/17/2015   Zoster Recombinat (Shingrix) 05/29/2005, 08/27/2017   Zoster, Live 04/08/2008, 05/29/2014, 05/28/2017   Zoster, Unspecified 05/29/2005   Pertinent  Health Maintenance Due  Topic Date Due   INFLUENZA VACCINE  11/06/2020   DEXA SCAN  Completed   PNA vac Low Risk Adult  Completed   MAMMOGRAM  Discontinued   Fall Risk  03/24/2014 10/15/2012  Falls in the past year? No No   Functional Status Survey:    Vitals:   12/04/20 1519  BP: 118/64  Pulse: 70  Resp: 15  Temp: (!) 96.4 F (35.8 C)  SpO2: 94%  Weight: 157 lb 4.8 oz (71.4 kg)  Height: $Remove'5\' 2"'Pjagkhe$  (1.575 m)   Body mass index is 28.77 kg/m. Physical Exam Vitals and nursing note reviewed.  Constitutional:      Appearance: Normal appearance.  HENT:     Head: Normocephalic and atraumatic.     Mouth/Throat:     Mouth: Mucous membranes are moist.  Eyes:     Extraocular Movements: Extraocular movements intact.      Conjunctiva/sclera:     Right eye: Right conjunctiva is not injected.     Left eye: Left conjunctiva is not injected.     Pupils: Pupils are equal, round, and reactive to light.  Neck:     Comments: Left adam's apple bony aspect sore/discomfort when palpated, no redness, warmth, swelling, or enlarged lymph nodes noted.  Cardiovascular:     Rate and Rhythm: Normal rate and regular rhythm.     Heart sounds: Murmur heard.     Comments: PD pulses are not felt.  Pulmonary:     Effort: Pulmonary effort is normal.     Breath sounds: Rales present. No wheezing or rhonchi.     Comments: Decreased air entry to both lungs. Bibasilar rales.  Abdominal:     General: Bowel sounds are normal.     Palpations: Abdomen is soft.     Tenderness: There is no abdominal tenderness.     Comments: Mid abd surgical scar  Musculoskeletal:        General: No tenderness.     Cervical back: Normal range of motion and neck supple.     Right lower leg: Edema present.     Left lower leg: Edema present.     Comments:  Decreased overhead ROM of the left shoulder. BLE edema trace  is chronic. Left knee s/p ORIF of the patella.  BLE discoloration, mild erythema BLE and left knee, left knee/LLE is  slightly warmer than the RLE.   Skin:    General: Skin is warm and dry.     Comments: Brownish skin discoloration BLE.   Neurological:     General: No focal deficit present.     Mental Status: She is alert and oriented to person, place, and time. Mental status is at baseline.     Gait: Gait abnormal.  Psychiatric:        Mood and Affect: Mood normal.        Behavior: Behavior normal.        Thought Content: Thought content normal.    Labs reviewed: Recent Labs    05/02/20 0630 05/26/20 0935 06/08/20 0000 06/13/20 0000 08/04/20 1205  NA 140 141 143 142 142  K 3.5 3.9 4.1 3.9 4.6  CL 104 104 105 108 104  CO2 25 26 31* 24* 27*  GLUCOSE 99 86  --   --   --   BUN 25* 27* 24* 22* 17  CREATININE 0.97 0.94 1.0 0.9  1.0  CALCIUM 9.2 9.5 9.4 9.6 10.0   Recent Labs    01/11/20 0000 06/08/20 0000  AST 16 16  ALT 11 9  ALKPHOS 109 138*  ALBUMIN 4.5 3.9   Recent Labs    01/11/20 0000 05/02/20 0630 05/26/20 0935 06/08/20 0000  WBC 5.1 6.3 5.7 5.5  NEUTROABS 2,953.00  --   --  3,515.00  HGB 14.5 13.6 14.7 14.2  HCT 44 42.6 46.5* 43  MCV  --  89.7 91.7  --   PLT 249 223 253 247   Lab Results  Component Value Date   TSH 1.79 06/29/2019   No results found for: HGBA1C Lab Results  Component Value Date   CHOL 164 10/12/2019   HDL 67 10/12/2019   LDLCALC 82 10/12/2019   TRIG 70 10/12/2019   CHOLHDL 3.6 06/27/2015    Significant Diagnostic Results in last 30 days:  No results found.  Assessment/Plan Fall the patient was found in the floor in front of her recliner. Resulted left fore arm skin tear, steri strips intact. Some soreness left lateral leg. Also c/o left adam apple soreness, denied new cough, sore throat, or chest pain. Denied X-ray evaluation today.   COVID-19 virus infection s/p Paxlovid, recovered.  Generalized osteoarthritis of multiple sites A closed nondisplaced transverse fracture left patella 04/26/20, f/u Ortho s/p  ORIF.  prn Oxycodone for pain. WBAT, in brace, working with therapy, prn Tylenol, Oxycodone.   GERD (gastroesophageal reflux disease) ST eval significant reflux for several weeks results of mucus production, no observed. oropharyngeal dysphagia. Takes  Omeprazole. Hgb 14.2 06/08/20, improved.   Restless legs syndrome (RLS) Restless leg symptom, takes MiraPex 0.$RemoveBeforeD'5mg'bxYMoYGLSUQUAe$  hs  Senile dementia (HCC) No behavioral issues,  on Memantine $RemoveBefo'10mg'zLEIRAVqvvM$  bid.  Seizure disorder ([place])  stable, on Keppra $RemoveB'500mg'YKjllXtT$  qhs, $Rem'250mg'itKg$  qd, Lamictal $RemoveBefo'150mg'hJkfatfyTIz$  bid.  Edema trace, on Furosemide,  Bun/creat 17/0.99 eGFR 60 08/03/20  Essential hypertension takes Amlodipine. Bun/creat 17/1.0 eGFR 52 08/04/20  COPD (chronic obstructive pulmonary disease) (HCC)  takes Spiriva qd, prn Albuterol  HFA  Slow transit constipation takes Senokot, MiraLax.   Senile osteoporosis stopped Foxamax per her request.     Family/ staff Communication: plan of care reviewed with the patient and charge nurse.   Labs/tests ordered:  none  Time spend 35 minutes.

## 2020-12-04 NOTE — Assessment & Plan Note (Signed)
Restless leg symptom, takes MiraPex 0.'5mg'$  hs

## 2020-12-04 NOTE — Assessment & Plan Note (Signed)
takes Spiriva qd, prn Albuterol HFA

## 2020-12-04 NOTE — Assessment & Plan Note (Signed)
No behavioral issues,  on Memantine '10mg'$  bid.

## 2020-12-04 NOTE — Assessment & Plan Note (Signed)
s/p Paxlovid, recovered.

## 2020-12-05 ENCOUNTER — Encounter: Payer: Self-pay | Admitting: Nurse Practitioner

## 2020-12-08 ENCOUNTER — Non-Acute Institutional Stay (SKILLED_NURSING_FACILITY): Payer: Medicare PPO | Admitting: Nurse Practitioner

## 2020-12-08 ENCOUNTER — Encounter: Payer: Self-pay | Admitting: Nurse Practitioner

## 2020-12-08 DIAGNOSIS — K5901 Slow transit constipation: Secondary | ICD-10-CM

## 2020-12-08 DIAGNOSIS — F0390 Unspecified dementia without behavioral disturbance: Secondary | ICD-10-CM

## 2020-12-08 DIAGNOSIS — M810 Age-related osteoporosis without current pathological fracture: Secondary | ICD-10-CM

## 2020-12-08 DIAGNOSIS — M159 Polyosteoarthritis, unspecified: Secondary | ICD-10-CM | POA: Diagnosis not present

## 2020-12-08 DIAGNOSIS — I10 Essential (primary) hypertension: Secondary | ICD-10-CM

## 2020-12-08 DIAGNOSIS — K219 Gastro-esophageal reflux disease without esophagitis: Secondary | ICD-10-CM

## 2020-12-08 DIAGNOSIS — U071 COVID-19: Secondary | ICD-10-CM

## 2020-12-08 DIAGNOSIS — R609 Edema, unspecified: Secondary | ICD-10-CM

## 2020-12-08 DIAGNOSIS — J431 Panlobular emphysema: Secondary | ICD-10-CM | POA: Diagnosis not present

## 2020-12-08 DIAGNOSIS — G40909 Epilepsy, unspecified, not intractable, without status epilepticus: Secondary | ICD-10-CM

## 2020-12-08 DIAGNOSIS — G2581 Restless legs syndrome: Secondary | ICD-10-CM

## 2020-12-08 NOTE — Assessment & Plan Note (Signed)
stable, on Keppra 500mg  qhs, 250mg  qd, Lamictal 150mg  bid.

## 2020-12-08 NOTE — Progress Notes (Signed)
Location:   [facility] Room Number: [place] of Service:  SNF (31) Provider:  Chelcie Estorga Otho Darner, NP  [name], MD  Patient Care Team: [name], MD as PCP - General (Internal Medicine) [name], MD as Consulting Physician (Ophthalmology) [name], MD as Consulting Physician (Cardiology) [name], MD as Consulting Physician (Dermatology) [name], MD as Consulting Physician (Orthopedic Surgery) [name], MD as Consulting Physician (Orthopedic Surgery) [name], MD as Consulting Physician (Orthopedic Surgery) [place], [place] Jarica Plass X, NP as Nurse Practitioner (Nurse Practitioner) [name], MD as Consulting Physician (Neurology)  Extended Emergency Contact Information Primary Emergency Contact: [name],[name] Address: [address]          [place], [place] [address] [name] of [place]  Phone: [phone] Relation: Sister Secondary Emergency Contact: [name] [place] of [place] Phone: [phone] Mobile Phone: [phone] Relation: Sister  Code Status:  DNR Goals of care: Advanced Directive information Advanced Directives 12/08/2020  Does Patient Have a Medical Advance Directive? Yes  Type of Advance Directive Out of facility DNR (pink MOST or yellow form)  Does patient want to make changes to medical advance directive? No - Patient declined  Copy of [place] in Chart? -  Pre-existing out of facility DNR order (yellow form or pink MOST form) Yellow form placed in chart (order not valid for inpatient use);Pink MOST form placed in chart (order not valid for inpatient use)     Chief Complaint  Patient presents with   Medical Management of Chronic Issues    Routine follow up    Health Maintenance    Discuss need for shingles vaccine and influenza vaccine.    HPI:  Pt is a 87 y.o. female seen today for medical management  of chronic diseases.    COVID infection, s/p Paxlovid, recovered.             A closed nondisplaced transverse fracture left patella 04/26/20, f/u Ortho s/p  ORIF.  prn Oxycodone for pain. WBAT, in brace, working with therapy             GERD ST eval significant reflux as results of mucus production, oropharyngeal dysphagia. Takes  Omeprazole. Hgb 14.2 06/08/20, improved.              Restless leg symptom, takes MiraPex 0.$RemoveBeforeD'5mg'QTdXhxuLOqVCGH$  hs             Hx of dementia,  on Memantine $RemoveBefo'10mg'PcrtRjphyeL$  bid.             Hx of seizures, stable, on Keppra $RemoveB'500mg'dAYRAnfj$  qhs, $Rem'250mg'zUHq$  qd, Lamictal $RemoveBefo'150mg'JeAwHnltawh$  bid.             Edema, trace, on Furosemide,  Bun/creat 17/0.99 eGFR 60 08/03/20             OA in general, on Tylenol, Norco             HTN, takes Amlodipine. Bun/creat 17/1.0 eGFR 52 08/04/20             COPD, takes Spiriva qd, prn Albuterol HFA             Constipation, takes Senokot, MiraLax.              OP, stopped Foxamax per her request.   Past Medical History:  Diagnosis Date   Acute bronchitis 05/23/2011   Acute upper respiratory infections  of unspecified site 05/23/2011   Arthritis    Chronic airway obstruction, not elsewhere classified 05/23/2011   Disturbance of salivary secretion 01/31/2011   Dizziness and giddiness 01/31/2011   Dyspnea    Essential tremor 04/25/2014   External hemorrhoids without mention of complication 10/62/6948   Gait disorder 04/25/2014   Insomnia, unspecified 09/12/2011   Lumbago 01/31/2011   Major depressive disorder, single episode, unspecified 01/31/2011   Memory disorder 04/25/2014   Mitral valve disorders(424.0) 01/31/2011   Other and unspecified hyperlipidemia 01/31/2011   Other convulsions 01/31/2011   Other emphysema ([place]) 01/31/2011   Pain in joint, site unspecified 01/31/2011   Restless legs syndrome (RLS) 09/12/2011   Retinal detachment with retinal defect of right eye 2011   right eye twice   Seizure disorder ([place])    Senile osteoporosis 01/31/2011   Spontaneous ecchymoses 01/31/2011    Stiffness of joints, not elsewhere classified, multiple sites 01/31/2011   Unspecified essential hypertension 01/31/2011   Past Surgical History:  Procedure Laterality Date   ABDOMINAL HYSTERECTOMY  06/21/2003   TAH/BSO, omenectomy PSB resect, Stg IC cystadenofibroma   CHOLECYSTECTOMY  2005   Dr. Marlou Starks   ELBOW SURGERY Right 2008   broken   Dr. Apolonio Schneiders   EYE SURGERY     ORIF PATELLA Left 05/02/2020   Procedure: OPEN REDUCTION INTERNAL (ORIF) FIXATION LEFT PATELLA WITH MEDIAL AND LATERAL LIGAMENT REINFORCEMENTS;  Surgeon: [name], MD;  Location: [facility];  Service: Orthopedics;  Laterality: Left;   ORIF PATELLA Left 05/30/2020   Procedure: OPEN REDUCTION INTERNAL (ORIF) FIXATION PATELLA;  Surgeon: [name], MD;  Location: [facility];  Service: Orthopedics;  Laterality: Left;   RETINAL DETACHMENT SURGERY N/A    two   REVERSE SHOULDER ARTHROPLASTY Left 05/06/2019   Procedure: REVERSE SHOULDER ARTHROPLASTY;  Surgeon: Justice Britain, MD;  Location: [facility];  Service: Orthopedics;  Laterality: Left;  125min   ROTATOR CUFF REPAIR Right 2012   Dr. Theda Sers   SQUAMOUS CELL CARCINOMA EXCISION Bilateral 2012, 8/14   [place] on legs   Dr. Sarajane Jews   TONSILLECTOMY  1941   VIDEO BRONCHOSCOPY WITH ENDOBRONCHIAL NAVIGATION N/A 11/29/2015   Procedure: VIDEO BRONCHOSCOPY WITH ENDOBRONCHIAL NAVIGATION;  Surgeon: Collene Gobble, MD;  Location: [facility];  Service: Thoracic;  Laterality: N/A;    Allergies  Allergen Reactions   Dyazide [Hydrochlorothiazide W-Triamterene]     Other reaction(s): her blood pressure too much   Latex Other (See Comments)    Swelling   Other     Other reaction(s): local red reaction    Rofecoxib     Other reaction(s): shortness of breath   Sulfa Antibiotics Nausea And Vomiting   Tetracycline Hcl     Other reaction(s): Cant take due to a drug interation    Allergies as of 12/08/2020       Reactions   Dyazide [hydrochlorothiazide W-triamterene]    Other reaction(s):  her blood pressure too much   Latex Other (See Comments)   Swelling   Other    Other reaction(s): local red reaction   Rofecoxib    Other reaction(s): shortness of breath   Sulfa Antibiotics Nausea And Vomiting   Tetracycline Hcl    Other reaction(s): Cant take due to a drug interation        Medication List        Accurate as of December 08, 2020 11:59 PM. If you have any questions, ask your nurse or doctor.  acetaminophen 500 MG tablet Commonly known as: TYLENOL Take 2 tablets (1,000 mg total) by mouth every 6 (six) hours as needed.   amLODipine 2.5 MG tablet Commonly known as: NORVASC Take 2.5 mg by mouth daily.   aspirin 81 MG chewable tablet Chew 81 mg by mouth daily.   CeraVe Crea Apply 1 application topically daily. Mix cream with Triamcinolone   chlorhexidine 0.12 % solution Commonly known as: PERIDEX Use as directed 15 mLs in the mouth or throat at bedtime.   cholecalciferol 1000 units tablet Commonly known as: VITAMIN D Take 1 tablet (1,000 Units total) by mouth daily.   furosemide 20 MG tablet Commonly known as: LASIX Take 20 mg by mouth daily.   hydrocortisone 2.5 % lotion Apply 1 application topically daily as needed (psoriasis).   ketoconazole 2 % cream Commonly known as: NIZORAL Apply 1 application topically as needed (for psoriasis).   lamoTRIgine 150 MG tablet Commonly known as: LAMICTAL Take 150 mg by mouth 2 (two) times daily.   levETIRAcetam 500 MG tablet Commonly known as: KEPPRA Take 500 mg by mouth at bedtime.   levETIRAcetam 250 MG tablet Commonly known as: KEPPRA Take 250 mg by mouth daily. In the morning   memantine 10 MG tablet Commonly known as: NAMENDA TAKE 1 TABLET BY MOUTH TWICE DAILY.   nystatin powder Commonly known as: MYCOSTATIN/NYSTOP Apply 1 application topically 2 (two) times daily as needed (tinea cruris).   oxyCODONE 5 MG immediate release tablet Commonly known as: Oxy IR/ROXICODONE Take 1  tablet (5 mg total) by mouth every 6 (six) hours as needed for severe pain.   pantoprazole 40 MG tablet Commonly known as: PROTONIX Take 40 mg by mouth daily.   polyethylene glycol 17 g packet Commonly known as: MIRALAX / GLYCOLAX Take 17 g by mouth daily.   Potassium Chloride ER 20 MEQ Tbcr Take 20 mEq by mouth daily.   pramipexole 0.25 MG tablet Commonly known as: MIRAPEX Take 0.5 mg by mouth at bedtime.   Robitussin Cough+ Chest Max St 10-200 MG/5ML Liqd Generic drug: Dextromethorphan-guaiFENesin Take 5 mLs by mouth daily as needed.   sennosides-docusate sodium 8.6-50 MG tablet Commonly known as: SENOKOT-S Take 2 tablets by mouth at bedtime.   simvastatin 10 MG tablet Commonly known as: ZOCOR Take 10 mg by mouth at bedtime.   tiotropium 18 MCG inhalation capsule Commonly known as: Spiriva HandiHaler INHALE CONTENTS OF ONE CAPSULE ONCE DAILY FOR COPD.   triamcinolone cream 0.1 % Commonly known as: KENALOG Apply 1 application topically daily. Mix with cerave        Review of Systems  Constitutional:  Negative for fatigue and fever.  HENT:  Positive for hearing loss. Negative for trouble swallowing.   Eyes:  Negative for visual disturbance.  Respiratory:  Negative for cough.        DOE is chronic  Cardiovascular:  Positive for leg swelling. Negative for chest pain and palpitations.  Gastrointestinal:  Negative for abdominal pain and constipation.       Acid reflux symptoms.   Genitourinary:  Negative for dysuria and urgency.  Musculoskeletal:  Positive for arthralgias, gait problem and joint swelling.       ORIF of the left patella.   Skin:  Negative for color change.       BLE discoloration, mild erythema BLE and left knee, left knee/LLE is slightly warmer than the RLE  Neurological:  Positive for seizures. Negative for speech difficulty, weakness and headaches.  Memory lapses. Hx of seizures. RLS  Psychiatric/Behavioral:  Negative for behavioral  problems and sleep disturbance. The patient is not nervous/anxious.    Immunization History  Administered Date(s) Administered   Influenza Split 01/06/2014, 01/15/2017, 01/08/2018, 12/09/2018   Influenza Whole 01/07/2012, 01/06/2013   Influenza, High Dose Seasonal PF 12/25/2015, 01/20/2017   Influenza,inj,Quad PF,6+ Mos 12/21/2014   Influenza-Unspecified 12/09/2018, 01/18/2020   Moderna Sars-Covid-2 Vaccination 04/12/2019, 06/05/2019, 02/15/2020, 09/05/2020   Pneumococcal Conjugate-13 02/01/2014   Pneumococcal Polysaccharide-23 12/20/1992, 01/15/2000, 04/08/2004, 06/08/2004   Td 04/08/2002, 04/22/2002   Tdap 04/09/2011, 08/17/2015   Zoster Recombinat (Shingrix) 05/29/2005, 08/27/2017   Zoster, Live 04/08/2008, 05/29/2014, 05/28/2017   Zoster, Unspecified 05/29/2005   Pertinent  Health Maintenance Due  Topic Date Due   INFLUENZA VACCINE  11/06/2020   DEXA SCAN  Completed   PNA vac Low Risk Adult  Completed   MAMMOGRAM  Discontinued   Fall Risk  03/24/2014 10/15/2012  Falls in the past year? No No   Functional Status Survey:    Vitals:   12/08/20 1056  BP: (!) 146/90  Pulse: 88  Resp: 20  Temp: (!) 96.4 F (35.8 C)  SpO2: 95%  Weight: 156 lb 1.6 oz (70.8 kg)  Height: 5\' 2"  (1.575 m)   Body mass index is 28.55 kg/m. Physical Exam Vitals and nursing note reviewed.  Constitutional:      Appearance: Normal appearance.  HENT:     Head: Normocephalic and atraumatic.     Mouth/Throat:     Mouth: Mucous membranes are moist.  Eyes:     Extraocular Movements: Extraocular movements intact.     Conjunctiva/sclera:     Right eye: Right conjunctiva is not injected.     Left eye: Left conjunctiva is not injected.     Pupils: Pupils are equal, round, and reactive to light.  Neck:     Comments: Left adam's apple bony aspect sore/discomfort when palpated, no redness, warmth, swelling, or enlarged lymph nodes noted.  Cardiovascular:     Rate and Rhythm: Normal rate and regular  rhythm.     Heart sounds: Murmur heard.     Comments: PD pulses are not felt.  Pulmonary:     Effort: Pulmonary effort is normal.     Breath sounds: Rales present.     Comments: Decreased air entry to both lungs. Bibasilar rales.  Abdominal:     General: Bowel sounds are normal.     Palpations: Abdomen is soft.     Tenderness: There is no abdominal tenderness.     Comments: Mid abd surgical scar  Musculoskeletal:        General: No tenderness.     Cervical back: Normal range of motion and neck supple.     Right lower leg: Edema present.     Left lower leg: Edema present.     Comments:  Decreased overhead ROM of the left shoulder. BLE edema trace  is chronic. Left knee s/p ORIF of the patella.  BLE discoloration  Skin:    General: Skin is warm and dry.     Comments: Brownish skin discoloration BLE.   Neurological:     General: No focal deficit present.     Mental Status: She is alert and oriented to person, place, and time. Mental status is at baseline.     Gait: Gait abnormal.  Psychiatric:        Mood and Affect: Mood normal.        Behavior: Behavior normal.  Thought Content: Thought content normal.    Labs reviewed: Recent Labs    05/02/20 0630 05/26/20 0935 06/08/20 0000 06/13/20 0000 08/04/20 1205  NA 140 141 143 142 142  K 3.5 3.9 4.1 3.9 4.6  CL 104 104 105 108 104  CO2 25 26 31* 24* 27*  GLUCOSE 99 86  --   --   --   BUN 25* 27* 24* 22* 17  CREATININE 0.97 0.94 1.0 0.9 1.0  CALCIUM 9.2 9.5 9.4 9.6 10.0   Recent Labs    01/11/20 0000 06/08/20 0000  AST 16 16  ALT 11 9  ALKPHOS 109 138*  ALBUMIN 4.5 3.9   Recent Labs    01/11/20 0000 05/02/20 0630 05/26/20 0935 06/08/20 0000  WBC 5.1 6.3 5.7 5.5  NEUTROABS 2,953.00  --   --  3,515.00  HGB 14.5 13.6 14.7 14.2  HCT 44 42.6 46.5* 43  MCV  --  89.7 91.7  --   PLT 249 223 253 247   Lab Results  Component Value Date   TSH 1.79 06/29/2019   No results found for: HGBA1C Lab Results   Component Value Date   CHOL 169 11/23/2020   HDL 64 11/23/2020   LDLCALC 88 11/23/2020   TRIG 80 11/23/2020   CHOLHDL 3.6 06/27/2015    Significant Diagnostic Results in last 30 days:  No results found.  Assessment/Plan Seizure disorder (HCC) stable, on Keppra $RemoveB'500mg'JtfbpctR$  qhs, $Rem'250mg'ptVQ$  qd, Lamictal $RemoveBefo'150mg'JMRSjoVDotQ$  bid.  Edema trace, on Furosemide,  Bun/creat 17/0.99 eGFR 60 08/03/20  Generalized osteoarthritis of multiple sites A closed nondisplaced transverse fracture left patella 04/26/20, f/u Ortho s/p  ORIF.  prn Oxycodone for pain. WBAT, in brace, working with therapy  GERD (gastroesophageal reflux disease) ST eval significant reflux of the results of mucus production,  oropharyngeal dysphagia. Takes  Omeprazole. Hgb 14.2 06/08/20, improved.   Restless legs syndrome (RLS) takes MiraPex 0.$RemoveBeforeD'5mg'WiGdmxCnJBvUqp$  hs  Senile dementia (HCC) No behavioral issues, high functional, on Memantine $RemoveBefo'10mg'RCdIzECYBrJ$  bid.  Essential hypertension Blood pressure is controlled,  takes Amlodipine. Bun/creat 17/1.0 eGFR 52 08/04/20  COPD (chronic obstructive pulmonary disease) (HCC) akes Spiriva qd, prn Albuterol HFA  Slow transit constipation takes Senokot, MiraLax.   Senile osteoporosis stopped Foxamax per her request.   COVID-19 virus infection s/p Paxlovid, recovered.    Family/ staff Communication: plan of care reviewed with the patient and charge nurse.   Labs/tests ordered:  none  Time spend 35 minutes.

## 2020-12-08 NOTE — Assessment & Plan Note (Signed)
s/p Paxlovid, recovered.

## 2020-12-08 NOTE — Assessment & Plan Note (Signed)
ST eval significant reflux of the results of mucus production,  oropharyngeal dysphagia. Takes  Omeprazole. Hgb 14.2 06/08/20, improved.

## 2020-12-08 NOTE — Assessment & Plan Note (Signed)
stopped Foxamax per her request.

## 2020-12-08 NOTE — Assessment & Plan Note (Signed)
takes MiraPex 0.5mg  hs

## 2020-12-08 NOTE — Assessment & Plan Note (Signed)
A closed nondisplaced transverse fracture left patella 04/26/20, f/u Ortho s/p  ORIF.  prn Oxycodone for pain. WBAT, in brace, working with therapy

## 2020-12-08 NOTE — Assessment & Plan Note (Signed)
akes Spiriva qd, prn Albuterol HFA

## 2020-12-08 NOTE — Assessment & Plan Note (Signed)
trace, on Furosemide,  Bun/creat 17/0.99 eGFR 60 08/03/20

## 2020-12-08 NOTE — Assessment & Plan Note (Signed)
No behavioral issues, high functional, on Memantine '10mg'$  bid.

## 2020-12-08 NOTE — Assessment & Plan Note (Signed)
Blood pressure is controlled,  takes Amlodipine. Bun/creat 17/1.0 eGFR 52 08/04/20

## 2020-12-08 NOTE — Assessment & Plan Note (Signed)
takes Senokot, MiraLax.

## 2020-12-13 ENCOUNTER — Encounter: Payer: Self-pay | Admitting: Nurse Practitioner

## 2020-12-21 ENCOUNTER — Non-Acute Institutional Stay (SKILLED_NURSING_FACILITY): Payer: Medicare PPO | Admitting: Orthopedic Surgery

## 2020-12-21 ENCOUNTER — Encounter: Payer: Self-pay | Admitting: Orthopedic Surgery

## 2020-12-21 ENCOUNTER — Other Ambulatory Visit: Payer: Self-pay | Admitting: Orthopedic Surgery

## 2020-12-21 DIAGNOSIS — T300 Burn of unspecified body region, unspecified degree: Secondary | ICD-10-CM | POA: Diagnosis not present

## 2020-12-21 DIAGNOSIS — H00014 Hordeolum externum left upper eyelid: Secondary | ICD-10-CM | POA: Diagnosis not present
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
## 2020-12-25 ENCOUNTER — Encounter: Payer: Self-pay | Admitting: Internal Medicine

## 2020-12-25 ENCOUNTER — Non-Acute Institutional Stay (SKILLED_NURSING_FACILITY): Payer: Medicare PPO | Admitting: Internal Medicine

## 2020-12-25 DIAGNOSIS — H01005 Unspecified blepharitis left lower eyelid: Secondary | ICD-10-CM | POA: Diagnosis not present

## 2020-12-25 DIAGNOSIS — H00015 Hordeolum externum left lower eyelid: Secondary | ICD-10-CM | POA: Diagnosis not present

## 2020-12-25 NOTE — Progress Notes (Signed)
Location: [facility] manager of Service:  SNF (31)  Provider:   Code Status:  Goals of Care:  Advanced Directives 12/08/2020  Does Patient Have a Medical Advance Directive? Yes  Type of Advance Directive Out of facility DNR (pink MOST or yellow form)  Does patient want to make changes to medical advance directive? No - Patient declined  Copy of [place] in Chart? -  Pre-existing out of facility DNR order (yellow form or pink MOST form) Yellow form placed in chart (order not valid for inpatient use);Pink MOST form placed in chart (order not valid for inpatient use)     Chief Complaint  Patient presents with   Acute Visit    HPI: Patient is a 87 y.o. female seen today for an acute visit for Left Eye Stye and redness per Patient Request  Patient also has h/o COPD, Hypertension,Seizure Disorder,Mild Cognitive impairment, Hyperlipidemia Chronic Venous Changes in LE  Fall in 1/22  leading to Left Humerus Fracture with Displacement   Sustained Left Patellar Fracture due to Mechanical fall Underwent ORIF on 1/25 and revision in 2/22  Seen at her request for Stye in Left Eye and redness No Discharge No Pain Vision not effected   Past Medical History:  Diagnosis Date   Acute bronchitis 05/23/2011   Acute upper respiratory infections of unspecified site 05/23/2011   Arthritis    Chronic airway obstruction, not elsewhere classified 05/23/2011   Disturbance of salivary secretion 01/31/2011   Dizziness and giddiness 01/31/2011   Dyspnea    Essential tremor 04/25/2014   External hemorrhoids without mention of complication A999333   Gait disorder 04/25/2014   Insomnia, unspecified 09/12/2011   Lumbago 01/31/2011   Major depressive disorder, single episode, unspecified 01/31/2011   Memory disorder 04/25/2014   Mitral valve disorders(424.0) 01/31/2011   Other and unspecified hyperlipidemia 01/31/2011   Other convulsions 01/31/2011   Other emphysema ([place])  01/31/2011   Pain in joint, site unspecified 01/31/2011   Restless legs syndrome (RLS) 09/12/2011   Retinal detachment with retinal defect of right eye 2011   right eye twice   Seizure disorder ([place])    Senile osteoporosis 01/31/2011   Spontaneous ecchymoses 01/31/2011   Stiffness of joints, not elsewhere classified, multiple sites 01/31/2011   Unspecified essential hypertension 01/31/2011    Past Surgical History:  Procedure Laterality Date   ABDOMINAL HYSTERECTOMY  06/21/2003   TAH/BSO, omenectomy PSB resect, Stg IC cystadenofibroma   CHOLECYSTECTOMY  2005   Dr. Marlou Starks   ELBOW SURGERY Right 2008   broken   Dr. Apolonio Schneiders   EYE SURGERY     ORIF PATELLA Left 05/02/2020   Procedure: OPEN REDUCTION INTERNAL (ORIF) FIXATION LEFT PATELLA WITH MEDIAL AND LATERAL LIGAMENT REINFORCEMENTS;  Surgeon: [name], MD;  [facility];  Service: Orthopedics;  Laterality: Left;   ORIF PATELLA Left 05/30/2020   Procedure: OPEN REDUCTION INTERNAL (ORIF) FIXATION PATELLA;  Surgeon: [name], MD;  [facility];  Service: Orthopedics;  Laterality: Left;   RETINAL DETACHMENT SURGERY N/A    two   REVERSE SHOULDER ARTHROPLASTY Left 05/06/2019   Procedure: REVERSE SHOULDER ARTHROPLASTY;  Surgeon: Justice Britain, MD;  [facility];  Service: Orthopedics;  Laterality: Left;  18mn   ROTATOR CUFF REPAIR Right 2012   Dr. CTheda Sers  SQUAMOUS CELL CARCINOMA EXCISION Bilateral 2012, 8/14   [place]ohns on legs   Dr. GSarajane Jews  TONSILLECTOMY  1941   VIDEO BRONCHOSCOPY WITH ENDOBRONCHIAL  NAVIGATION N/A 11/29/2015   Procedure: VIDEO BRONCHOSCOPY WITH ENDOBRONCHIAL NAVIGATION;  Surgeon: Collene Gobble, MD;  [facility];  Service: Thoracic;  Laterality: N/A;    Allergies  Allergen Reactions   Dyazide [Hydrochlorothiazide W-Triamterene]     Other reaction(s): her blood pressure too much   Latex Other (See Comments)    Swelling   Other     Other reaction(s): local red reaction    Rofecoxib      Other reaction(s): shortness of breath   Sulfa Antibiotics Nausea And Vomiting   Tetracycline Hcl     Other reaction(s): Cant take due to a drug interation    Outpatient Encounter Medications as of 12/25/2020  Medication Sig   acetaminophen (TYLENOL) 500 MG tablet Take 2 tablets (1,000 mg total) by mouth every 6 (six) hours as needed.   amLODipine (NORVASC) 2.5 MG tablet Take 2.5 mg by mouth daily.   aspirin 81 MG chewable tablet Chew 81 mg by mouth daily.   chlorhexidine (PERIDEX) 0.12 % solution Use as directed 15 mLs in the mouth or throat at bedtime.   cholecalciferol (VITAMIN D) 1000 units tablet Take 1 tablet (1,000 Units total) by mouth daily.   Dextromethorphan-guaiFENesin (ROBITUSSIN COUGH+ CHEST MAX ST) 10-200 MG/5ML LIQD Take 5 mLs by mouth daily as needed.   Emollient (CERAVE) CREA Apply 1 application topically daily. Mix cream with Triamcinolone   furosemide (LASIX) 20 MG tablet Take 20 mg by mouth daily.   hydrocortisone 2.5 % lotion Apply 1 application topically daily as needed (psoriasis).   ketoconazole (NIZORAL) 2 % cream Apply 1 application topically as needed (for psoriasis).    lamoTRIgine (LAMICTAL) 150 MG tablet Take 150 mg by mouth 2 (two) times daily.   levETIRAcetam (KEPPRA) 250 MG tablet Take 250 mg by mouth daily. In the morning   levETIRAcetam (KEPPRA) 500 MG tablet Take 500 mg by mouth at bedtime.   memantine (NAMENDA) 10 MG tablet TAKE 1 TABLET BY MOUTH TWICE DAILY.   nystatin (MYCOSTATIN/NYSTOP) powder Apply 1 application topically 2 (two) times daily as needed (tinea cruris).   oxyCODONE (OXY IR/ROXICODONE) 5 MG immediate release tablet Take 1 tablet (5 mg total) by mouth every 6 (six) hours as needed for severe pain.   pantoprazole (PROTONIX) 40 MG tablet Take 40 mg by mouth daily.   polyethylene glycol (MIRALAX / GLYCOLAX) 17 g packet Take 17 g by mouth daily.   Potassium Chloride ER 20 MEQ TBCR Take 20 mEq by mouth daily.   pramipexole (MIRAPEX) 0.25 MG  tablet Take 0.5 mg by mouth at bedtime.   sennosides-docusate sodium (SENOKOT-S) 8.6-50 MG tablet Take 2 tablets by mouth at bedtime.   simvastatin (ZOCOR) 10 MG tablet Take 10 mg by mouth at bedtime.   tiotropium (SPIRIVA HANDIHALER) 18 MCG inhalation capsule INHALE CONTENTS OF ONE CAPSULE ONCE DAILY FOR COPD.   triamcinolone (KENALOG) 0.1 % Apply 1 application topically daily. Mix with cerave   No facility-administered encounter medications on file as of 12/25/2020.    Review of Systems:  Review of Systems Review of Systems  Constitutional: Negative for activity change, appetite change, chills, diaphoresis, fatigue and fever.  HENT: Negative for mouth sores, postnasal drip, rhinorrhea, sinus pain and sore throat.   Respiratory: Negative for apnea, cough, chest tightness, shortness of breath and wheezing.   Cardiovascular: Negative for chest pain, palpitations and leg swelling.  Gastrointestinal: Negative for abdominal distention, abdominal pain, constipation, diarrhea, nausea and vomiting.  Genitourinary: Negative for dysuria and frequency.  Musculoskeletal: Negative for arthralgias, joint swelling and myalgias.  Skin: Negative for rash.  Neurological: Negative for dizziness, syncope, weakness, light-headedness and numbness.  Psychiatric/Behavioral: Negative for behavioral problems, confusion and sleep disturbance.    Health Maintenance  Topic Date Due   Zoster Vaccines- Shingrix (2 of 2) 10/22/2017   INFLUENZA VACCINE  11/06/2020   COVID-19 Vaccine (5 - Booster for Moderna series) 01/05/2021   TETANUS/TDAP  08/16/2025   DEXA SCAN  Completed   HPV VACCINES  Aged Out   MAMMOGRAM  Discontinued    Physical Exam: Vitals:   12/25/20 1543  BP: 139/87  Pulse: 88  Resp: 20  Temp: (!) 97.5 F (36.4 C)  Weight: 157 lb (71.2 kg)   Body mass index is 25.73 kg/m. Physical Exam Vitals reviewed.  Constitutional:      Appearance: Normal appearance.  HENT:     Head: Normocephalic.      Nose: Nose normal.     Mouth/Throat:     Mouth: Mucous membranes are moist.     Pharynx: Oropharynx is clear.  Eyes:     Pupils: Pupils are equal, round, and reactive to light.     Comments: Left Eye Stye in Lower Eyelid with Redness around the Eyelid  Cardiovascular:     Rate and Rhythm: Normal rate and regular rhythm.  Pulmonary:     Effort: Pulmonary effort is normal.     Breath sounds: Normal breath sounds.  Abdominal:     General: Abdomen is flat. Bowel sounds are normal.     Palpations: Abdomen is soft.  Musculoskeletal:        General: Swelling present.     Cervical back: Neck supple.  Skin:    General: Skin is warm.  Neurological:     General: No focal deficit present.     Mental Status: She is alert.  Psychiatric:        Mood and Affect: Mood normal.        Thought Content: Thought content normal.    Labs reviewed: Basic Metabolic Panel: Recent Labs    05/02/20 0630 05/26/20 0935 06/08/20 0000 06/13/20 0000 08/04/20 1205  NA 140 141 143 142 142  K 3.5 3.9 4.1 3.9 4.6  CL 104 104 105 108 104  CO2 25 26 31* 24* 27*  GLUCOSE 99 86  --   --   --   BUN 25* 27* 24* 22* 17  CREATININE 0.97 0.94 1.0 0.9 1.0  CALCIUM 9.2 9.5 9.4 9.6 10.0   Liver Function Tests: Recent Labs    01/11/20 0000 06/08/20 0000  AST 16 16  ALT 11 9  ALKPHOS 109 138*  ALBUMIN 4.5 3.9   No results for input(s): LIPASE, AMYLASE in the last 8760 hours. No results for input(s): AMMONIA in the last 8760 hours. CBC: Recent Labs    01/11/20 0000 05/02/20 0630 05/26/20 0935 06/08/20 0000  WBC 5.1 6.3 5.7 5.5  NEUTROABS 2,953.00  --   --  3,515.00  HGB 14.5 13.6 14.7 14.2  HCT 44 42.6 46.5* 43  MCV  --  89.7 91.7  --   PLT 249 223 253 247   Lipid Panel: Recent Labs    11/23/20 0000  CHOL 169  HDL 64  LDLCALC 88  TRIG 80   No results found for: HGBA1C  Procedures since last visit: No results found.  Assessment/Plan 1. Hordeolum externum of left lower  eyelid Continue Warm Compresses BID till resolved 2. Blepharitis of left lower  eyelid, unspecified type  Ocuflox Eye Drops QID for 2 weeks  Other issues  Edema, unspecified type Looks at her baseline On Lasix Essential hypertension BP stable on Norvasc Seizure disorder (HCC) Continue on Kepprra and Lamictal s/p ORIF of [place] Doing well Pain Controlled Walking well with her walker   Senile osteoporosis Was on Fosmax took herself of it and does not want to take anything anymore   Generalized osteoarthritis of multiple sites On Oxycodone PRN   Restless legs syndrome (RLS) On Mirapex   Senile dementia without behavioral disturbance (HCC) Highly Functional On NAmenda HLD On Statin   Other emphysema (HCC) On Spiriva  Labs/tests ordered:  * No order type specified * Next appt:  Visit date not found

## 2020-12-25 NOTE — Progress Notes (Signed)
A user error has taken place: encounter opened in error, closed for administrative reasons.

## 2021-01-02 ENCOUNTER — Encounter: Payer: Self-pay | Admitting: Orthopedic Surgery

## 2021-01-02 ENCOUNTER — Non-Acute Institutional Stay (SKILLED_NURSING_FACILITY): Payer: Medicare PPO | Admitting: Orthopedic Surgery

## 2021-01-02 DIAGNOSIS — F0390 Unspecified dementia without behavioral disturbance: Secondary | ICD-10-CM | POA: Diagnosis not present

## 2021-01-02 DIAGNOSIS — K219 Gastro-esophageal reflux disease without esophagitis: Secondary | ICD-10-CM | POA: Diagnosis not present

## 2021-01-02 DIAGNOSIS — G40909 Epilepsy, unspecified, not intractable, without status epilepticus: Secondary | ICD-10-CM

## 2021-01-02 DIAGNOSIS — H00015 Hordeolum externum left lower eyelid: Secondary | ICD-10-CM | POA: Diagnosis not present

## 2021-01-02 DIAGNOSIS — R609 Edema, unspecified: Secondary | ICD-10-CM

## 2021-01-02 DIAGNOSIS — H01005 Unspecified blepharitis left lower eyelid: Secondary | ICD-10-CM | POA: Diagnosis not present

## 2021-01-02 DIAGNOSIS — Z9889 Other specified postprocedural states: Secondary | ICD-10-CM | POA: Diagnosis not present

## 2021-01-02 DIAGNOSIS — T300 Burn of unspecified body region, unspecified degree: Secondary | ICD-10-CM
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
## 2021-01-08 ENCOUNTER — Encounter: Payer: Self-pay | Admitting: Nurse Practitioner

## 2021-01-08 ENCOUNTER — Non-Acute Institutional Stay (SKILLED_NURSING_FACILITY): Payer: Medicare PPO | Admitting: Nurse Practitioner

## 2021-01-08 DIAGNOSIS — J431 Panlobular emphysema: Secondary | ICD-10-CM

## 2021-01-08 DIAGNOSIS — K219 Gastro-esophageal reflux disease without esophagitis: Secondary | ICD-10-CM

## 2021-01-08 DIAGNOSIS — K5901 Slow transit constipation: Secondary | ICD-10-CM

## 2021-01-08 DIAGNOSIS — E782 Mixed hyperlipidemia: Secondary | ICD-10-CM

## 2021-01-08 DIAGNOSIS — G40909 Epilepsy, unspecified, not intractable, without status epilepticus: Secondary | ICD-10-CM | POA: Diagnosis not present

## 2021-01-08 DIAGNOSIS — M159 Polyosteoarthritis, unspecified: Secondary | ICD-10-CM

## 2021-01-08 DIAGNOSIS — M810 Age-related osteoporosis without current pathological fracture: Secondary | ICD-10-CM

## 2021-01-08 DIAGNOSIS — F0390 Unspecified dementia without behavioral disturbance: Secondary | ICD-10-CM | POA: Diagnosis not present

## 2021-01-08 DIAGNOSIS — I10 Essential (primary) hypertension: Secondary | ICD-10-CM

## 2021-01-08 DIAGNOSIS — G2581 Restless legs syndrome: Secondary | ICD-10-CM

## 2021-01-08 DIAGNOSIS — R609 Edema, unspecified: Secondary | ICD-10-CM | POA: Diagnosis not present

## 2021-01-08 NOTE — Assessment & Plan Note (Signed)
ST eval significant reflux as results of mucus production, oropharyngeal dysphagia. Takes  Omeprazole. Hgb 14.2 06/08/20, improved.

## 2021-01-08 NOTE — Assessment & Plan Note (Signed)
trace, on Furosemide,  Bun/creat 17/0.99 eGFR 60 08/03/20, RLE>LLE

## 2021-01-08 NOTE — Progress Notes (Signed)
Location:   [place] Room Number: 23 Place of Service:  SNF (31) Provider:  Marlana Latus, NP  [name], MD  Patient Care Team: [name], MD as PCP - General (Internal Medicine) [name], MD as Consulting Physician (Ophthalmology) [name], MD as Consulting Physician (Cardiology) [name], MD as Consulting Physician (Dermatology) [name], MD as Consulting Physician (Orthopedic Surgery) [name], MD as Consulting Physician (Orthopedic Surgery) [name], MD as Consulting Physician (Orthopedic Surgery) [place], [place], Elajah Kunsman X, NP as Nurse Practitioner (Nurse Practitioner) [name], MD as Consulting Physician (Neurology)  Extended Emergency Contact Information Primary Emergency Contact: [name],[name] Address: [address]          [place], [place] [address] [name] of [place] Phone: [phone] Relation: Sister Secondary Emergency Contact: [name] [place] of [place] Phone: [phone] Mobile Phone: [phone] Relation: Sister  Code Status:  DNR Goals of care: Advanced Directive information Advanced Directives 01/08/2021  Does Patient Have a Medical Advance Directive? Yes  Type of Advance Directive Out of facility DNR (pink MOST or yellow form)  Does patient want to make changes to medical advance directive? No - Patient declined  Copy of [place] in Chart? -  Pre-existing out of facility DNR order (yellow form or pink MOST form) Yellow form placed in chart (order not valid for inpatient use);Pink MOST form placed in chart (order not valid for inpatient use)     Chief Complaint  Patient presents with   Medical Management of Chronic Issues    Routine follow up visit.    HPI:  Pt is a 87 y.o. female seen today for medical management of chronic diseases.      COVID infection, s/p Paxlovid, recovered.              A closed nondisplaced transverse fracture left patella 04/26/20, f/u Ortho s/p  ORIF.  prn Oxycodone for pain. Walking with walker.              GERD ST eval significant reflux as results of mucus production, oropharyngeal dysphagia. Takes  Omeprazole. Hgb 14.2 06/08/20, improved.              Restless leg symptom, takes MiraPex 0.$RemoveBeforeD'5mg'upoQZSNkYRBcPM$  hs             Hx of dementia,  on Memantine $RemoveBefo'10mg'jXyUVQBEwlu$  bid.             Hx of seizures, stable, on Keppra $RemoveB'500mg'kOmAJRaI$  qhs, $Rem'250mg'srgF$  qd, Lamictal $RemoveBefo'150mg'dSnmMIzkuAD$  bid.             Edema, trace, on Furosemide,  Bun/creat 17/0.99 eGFR 60 08/03/20             OA in general, on Tylenol, Oxycodone             HTN, takes Amlodipine. Bun/creat 17/1.0 eGFR 52 08/04/20             COPD, takes Spiriva qd, prn Albuterol HFA             Constipation, takes Senokot, MiraLax.              OP, stopped Foxamax per her request.   Hyperlipidemia, takes Simvastatin, LDL 88 11/23/20    Past Medical History:  Diagnosis Date   Acute bronchitis 05/23/2011   Acute upper respiratory infections of unspecified site 05/23/2011  Arthritis    Chronic airway obstruction, not elsewhere classified 05/23/2011   Disturbance of salivary secretion 01/31/2011   Dizziness and giddiness 01/31/2011   Dyspnea    Essential tremor 04/25/2014   External hemorrhoids without mention of complication 70/48/8891   Gait disorder 04/25/2014   Insomnia, unspecified 09/12/2011   Lumbago 01/31/2011   Major depressive disorder, single episode, unspecified 01/31/2011   Memory disorder 04/25/2014   Mitral valve disorders(424.0) 01/31/2011   Other and unspecified hyperlipidemia 01/31/2011   Other convulsions 01/31/2011   Other emphysema ([place]) 01/31/2011   Pain in joint, site unspecified 01/31/2011   Restless legs syndrome (RLS) 09/12/2011   Retinal detachment with retinal defect of right eye 2011   right eye twice   Seizure disorder ([place])    Senile osteoporosis 01/31/2011   Spontaneous ecchymoses 01/31/2011   Stiffness of joints, not  elsewhere classified, multiple sites 01/31/2011   Unspecified essential hypertension 01/31/2011   Past Surgical History:  Procedure Laterality Date   ABDOMINAL HYSTERECTOMY  06/21/2003   TAH/BSO, omenectomy PSB resect, Stg IC cystadenofibroma   CHOLECYSTECTOMY  2005   Dr. Marlou Starks   ELBOW SURGERY Right 2008   broken   Dr. Apolonio Schneiders   EYE SURGERY     ORIF PATELLA Left 05/02/2020   Procedure: OPEN REDUCTION INTERNAL (ORIF) FIXATION LEFT PATELLA WITH MEDIAL AND LATERAL LIGAMENT REINFORCEMENTS;  Surgeon: [name], MD;  Location: [facility];  Service: Orthopedics;  Laterality: Left;   ORIF PATELLA Left 05/30/2020   Procedure: OPEN REDUCTION INTERNAL (ORIF) FIXATION PATELLA;  Surgeon: [name], MD;  Location: [facility];  Service: Orthopedics;  Laterality: Left;   RETINAL DETACHMENT SURGERY N/A    two   REVERSE SHOULDER ARTHROPLASTY Left 05/06/2019   Procedure: REVERSE SHOULDER ARTHROPLASTY;  Surgeon: Justice Britain, MD;  Location: [facility];  Service: Orthopedics;  Laterality: Left;  185min   ROTATOR CUFF REPAIR Right 2012   Dr. Theda Sers   SQUAMOUS CELL CARCINOMA EXCISION Bilateral 2012, 8/14   [place] on legs   Dr. Sarajane Jews   TONSILLECTOMY  1941   VIDEO BRONCHOSCOPY WITH ENDOBRONCHIAL NAVIGATION N/A 11/29/2015   Procedure: VIDEO BRONCHOSCOPY WITH ENDOBRONCHIAL NAVIGATION;  Surgeon: Collene Gobble, MD;  Location: [facility];  Service: Thoracic;  Laterality: N/A;    Allergies  Allergen Reactions   Dyazide [Hydrochlorothiazide W-Triamterene]     Other reaction(s): her blood pressure too much   Latex Other (See Comments)    Swelling   Other     Other reaction(s): local red reaction    Rofecoxib     Other reaction(s): shortness of breath   Sulfa Antibiotics Nausea And Vomiting   Tetracycline Hcl     Other reaction(s): Cant take due to a drug interation    Allergies as of 01/08/2021       Reactions   Dyazide [hydrochlorothiazide W-triamterene]    Other reaction(s): her blood pressure too much    Latex Other (See Comments)   Swelling   Other    Other reaction(s): local red reaction   Rofecoxib    Other reaction(s): shortness of breath   Sulfa Antibiotics Nausea And Vomiting   Tetracycline Hcl    Other reaction(s): Cant take due to a drug interation        Medication List        Accurate as of January 08, 2021 11:59 PM. If you have any questions, ask your nurse or doctor.          STOP taking these medications  bisacodyl 10 MG suppository Commonly known as: DULCOLAX Stopped by: Beverlie Kurihara X Nakina Spatz, NP   [employer] Poly Bacitracin 500-10000 UNIT/GM Oint Stopped by: Ragen Laver X Jayvien Rowlette, NP   MILK OF MAGNESIA PO Stopped by: Sostenes Kauffmann X Valena Ivanov, NP   ofloxacin 0.3 % ophthalmic solution Commonly known as: OCUFLOX Stopped by: Cameryn Schum X Aland Chestnutt, NP   RA SALINE ENEMA RE Stopped by: Jeilyn Reznik X Mckay Brandt, NP       TAKE these medications    acetaminophen 500 MG tablet Commonly known as: TYLENOL Take 2 tablets (1,000 mg total) by mouth every 6 (six) hours as needed.   amLODipine 2.5 MG tablet Commonly known as: NORVASC Take 2.5 mg by mouth daily.   aspirin 81 MG chewable tablet Chew 81 mg by mouth daily.   CeraVe Crea Apply 1 application topically daily. Mix cream with Triamcinolone   chlorhexidine 0.12 % solution Commonly known as: PERIDEX Use as directed 15 mLs in the mouth or throat at bedtime.   cholecalciferol 1000 units tablet Commonly known as: VITAMIN D Take 1 tablet (1,000 Units total) by mouth daily.   furosemide 20 MG tablet Commonly known as: LASIX Take 20 mg by mouth daily.   hydrocortisone 2.5 % lotion Apply 1 application topically daily as needed (psoriasis).   hydrocortisone cream 1 % Apply 1 application topically 2 (two) times daily as needed for itching.   ketoconazole 2 % shampoo Commonly known as: NIZORAL Apply 1 application topically daily.   ketoconazole 2 % cream Commonly known as: NIZORAL Apply 1 application topically as needed (for psoriasis).   lamoTRIgine  150 MG tablet Commonly known as: LAMICTAL Take 150 mg by mouth 2 (two) times daily.   levETIRAcetam 500 MG tablet Commonly known as: KEPPRA Take 500 mg by mouth at bedtime.   levETIRAcetam 250 MG tablet Commonly known as: KEPPRA Take 250 mg by mouth daily. In the morning   memantine 10 MG tablet Commonly known as: NAMENDA TAKE 1 TABLET BY MOUTH TWICE DAILY.   nystatin powder Commonly known as: MYCOSTATIN/NYSTOP Apply 1 application topically 2 (two) times daily as needed (tinea cruris).   oxyCODONE 5 MG immediate release tablet Commonly known as: Oxy IR/ROXICODONE Take 1 tablet (5 mg total) by mouth every 6 (six) hours as needed for severe pain.   pantoprazole 40 MG tablet Commonly known as: PROTONIX Take 40 mg by mouth daily.   polyethylene glycol 17 g packet Commonly known as: MIRALAX / GLYCOLAX Take 17 g by mouth daily.   Potassium Chloride ER 20 MEQ Tbcr Take 20 mEq by mouth daily.   pramipexole 0.25 MG tablet Commonly known as: MIRAPEX Take 0.5 mg by mouth at bedtime.   Robitussin Cough+ Chest Max St 10-200 MG/5ML Liqd Generic drug: Dextromethorphan-guaiFENesin Take 5 mLs by mouth daily as needed.   sennosides-docusate sodium 8.6-50 MG tablet Commonly known as: SENOKOT-S Take 2 tablets by mouth at bedtime.   simvastatin 10 MG tablet Commonly known as: ZOCOR Take 10 mg by mouth at bedtime.   tiotropium 18 MCG inhalation capsule Commonly known as: Spiriva HandiHaler INHALE CONTENTS OF ONE CAPSULE ONCE DAILY FOR COPD.   triamcinolone cream 0.1 % Commonly known as: KENALOG Apply 1 application topically daily. Mix with cerave        Review of Systems  Constitutional:  Negative for activity change, appetite change and fever.  HENT:  Positive for hearing loss. Negative for trouble swallowing.   Eyes:  Negative for visual disturbance.       Lateral left  lower eyelid stye is healing   Respiratory:  Negative for cough.        DOE is chronic   Cardiovascular:  Positive for leg swelling. Negative for chest pain and palpitations.  Gastrointestinal:  Negative for abdominal pain and constipation.       Acid reflux symptoms.   Genitourinary:  Negative for dysuria and urgency.  Musculoskeletal:  Positive for arthralgias, gait problem and joint swelling.       ORIF of the left patella.   Skin:  Negative for color change.       BLE discoloration, mild erythema BLE and left knee  Neurological:  Positive for seizures. Negative for speech difficulty, weakness and headaches.       Memory lapses. Hx of seizures. RLS  Psychiatric/Behavioral:  Negative for behavioral problems and sleep disturbance. The patient is not nervous/anxious.    Immunization History  Administered Date(s) Administered   Influenza Split 01/06/2014, 01/15/2017, 01/08/2018, 12/09/2018   Influenza Whole 01/07/2012, 01/06/2013   Influenza, High Dose Seasonal PF 12/25/2015, 01/20/2017   Influenza,inj,Quad PF,6+ Mos 12/21/2014   Influenza-Unspecified 12/09/2018, 01/18/2020   Moderna Sars-Covid-2 Vaccination 04/12/2019, 06/05/2019, 02/15/2020, 09/05/2020   Pneumococcal Conjugate-13 02/01/2014   Pneumococcal Polysaccharide-23 12/20/1992, 01/15/2000, 04/08/2004, 06/08/2004   Td 04/08/2002, 04/22/2002   Tdap 04/09/2011, 08/17/2015   Zoster Recombinat (Shingrix) 05/29/2005, 08/27/2017   Zoster, Live 04/08/2008, 05/29/2014, 05/28/2017   Zoster, Unspecified 05/29/2005   Pertinent  Health Maintenance Due  Topic Date Due   INFLUENZA VACCINE  11/06/2020   DEXA SCAN  Completed   MAMMOGRAM  Discontinued   Fall Risk  03/24/2014 10/15/2012  Falls in the past year? No No   Functional Status Survey:    Vitals:   01/08/21 1531  BP: (!) 135/93  Pulse: 85  Resp: 16  Temp: (!) 97.1 F (36.2 C)  SpO2: 93%  Weight: 160 lb (72.6 kg)  Height: 5' 5.5" (1.664 m)   Body mass index is 26.22 kg/m. Physical Exam Vitals and nursing note reviewed.  Constitutional:       Appearance: Normal appearance.  HENT:     Head: Normocephalic and atraumatic.     Nose: Nose normal.     Mouth/Throat:     Mouth: Mucous membranes are moist.  Eyes:     Extraocular Movements: Extraocular movements intact.     Conjunctiva/sclera:     Right eye: Right conjunctiva is not injected.     Left eye: Left conjunctiva is not injected.     Pupils: Pupils are equal, round, and reactive to light.     Comments: Healing lateral left lower eyelid stye.   Neck:     Comments: Left adam's apple bony aspect sore/discomfort when palpated, no redness, warmth, swelling, or enlarged lymph nodes noted.  Cardiovascular:     Rate and Rhythm: Normal rate and regular rhythm.     Heart sounds: Murmur heard.     Comments: PD pulses are not felt.  Pulmonary:     Effort: Pulmonary effort is normal.     Breath sounds: Rales present.     Comments: Decreased air entry to both lungs. Bibasilar rales.  Abdominal:     General: Bowel sounds are normal.     Palpations: Abdomen is soft.     Tenderness: There is no abdominal tenderness.     Comments: Mid abd surgical scar  Musculoskeletal:        General: No tenderness.     Cervical back: Normal range of motion and neck supple.  Right lower leg: Edema present.     Left lower leg: Edema present.     Comments:  Decreased overhead ROM of the left shoulder. BLE edema trace  is chronic. Left knee s/p ORIF of the patella, mild warmth and swelling comparing to the right knee. BLE discoloration  Skin:    General: Skin is warm and dry.     Comments: Brownish skin discoloration BLE.   Neurological:     General: No focal deficit present.     Mental Status: She is alert and oriented to person, place, and time. Mental status is at baseline.     Gait: Gait abnormal.  Psychiatric:        Mood and Affect: Mood normal.        Behavior: Behavior normal.        Thought Content: Thought content normal.    Labs reviewed: Recent Labs    05/02/20 0630  05/26/20 0935 06/08/20 0000 06/13/20 0000 08/04/20 1205  NA 140 141 143 142 142  K 3.5 3.9 4.1 3.9 4.6  CL 104 104 105 108 104  CO2 25 26 31* 24* 27*  GLUCOSE 99 86  --   --   --   BUN 25* 27* 24* 22* 17  CREATININE 0.97 0.94 1.0 0.9 1.0  CALCIUM 9.2 9.5 9.4 9.6 10.0   Recent Labs    01/11/20 0000 06/08/20 0000  AST 16 16  ALT 11 9  ALKPHOS 109 138*  ALBUMIN 4.5 3.9   Recent Labs    01/11/20 0000 05/02/20 0630 05/26/20 0935 06/08/20 0000  WBC 5.1 6.3 5.7 5.5  NEUTROABS 2,953.00  --   --  3,515.00  HGB 14.5 13.6 14.7 14.2  HCT 44 42.6 46.5* 43  MCV  --  89.7 91.7  --   PLT 249 223 253 247   Lab Results  Component Value Date   TSH 1.79 06/29/2019   No results found for: HGBA1C Lab Results  Component Value Date   CHOL 169 11/23/2020   HDL 64 11/23/2020   LDLCALC 88 11/23/2020   TRIG 80 11/23/2020   CHOLHDL 3.6 06/27/2015    Significant Diagnostic Results in last 30 days:  No results found.  Assessment/Plan Hyperlipidemia  takes Simvastatin, LDL 88 11/23/20  Senile osteoporosis  stopped Foxamax per her request.   Slow transit constipation Stable, takes Senokot, MiraLax.   COPD (chronic obstructive pulmonary disease) (HCC) takes Spiriva qd, prn Albuterol HFA  Essential hypertension Blood pressure is mildly elevated today, asymptomatic, continue  Amlodipine. Bun/creat 17/1.0 eGFR 52 08/04/20, continue observe Bp  Generalized osteoarthritis of multiple sites OA in general, on Tylenol, Oxycodone.  A closed nondisplaced transverse fracture left patella 04/26/20, f/u Ortho s/p  ORIF. Walking with walker.   Edema trace, on Furosemide,  Bun/creat 17/0.99 eGFR 60 08/03/20, RLE>LLE  Seizure disorder (HCC) stable, on Keppra $RemoveB'500mg'HOYoyrvx$  qhs, $Rem'250mg'JRxX$  qd, Lamictal $RemoveBefo'150mg'ndrtnYrVgBi$  bid.  Senile dementia ([place]) on Memantine $RemoveBefo'10mg'oLcvqjNeBUC$  bid, high functional, resides in SN[facility] for [facility] care  Restless legs syndrome (RLS) takes MiraPex 0.$RemoveBeforeD'5mg'YPrOSQUpSJribb$  hs  GERD (gastroesophageal reflux  disease) ST eval significant reflux as results of mucus production, oropharyngeal dysphagia. Takes  Omeprazole. Hgb 14.2 06/08/20, improved.     Family/ staff Communication: plan of care reviewed with the patient and charge nurse.   Labs/tests ordered:  none  Time spend 25 minutes.

## 2021-01-08 NOTE — Assessment & Plan Note (Addendum)
takes MiraPex 0.5mg  hs

## 2021-01-08 NOTE — Assessment & Plan Note (Addendum)
OA in general, on Tylenol, Oxycodone.  A closed nondisplaced transverse fracture left patella 04/26/20, f/u Ortho s/p  ORIF. Walking with walker.

## 2021-01-08 NOTE — Assessment & Plan Note (Addendum)
Blood pressure is mildly elevated today, asymptomatic, continue  Amlodipine. Bun/creat 17/1.0 eGFR 52 08/04/20, continue observe Bp

## 2021-01-08 NOTE — Assessment & Plan Note (Signed)
Stable, takes Senokot, MiraLax.

## 2021-01-08 NOTE — Assessment & Plan Note (Signed)
takes Spiriva qd, prn Albuterol HFA

## 2021-01-08 NOTE — Assessment & Plan Note (Signed)
takes Simvastatin, LDL 88 11/23/20

## 2021-01-08 NOTE — Assessment & Plan Note (Signed)
stopped Foxamax per her request.

## 2021-01-08 NOTE — Assessment & Plan Note (Signed)
stable, on Keppra 500mg  qhs, 250mg  qd, Lamictal 150mg  bid.

## 2021-01-08 NOTE — Assessment & Plan Note (Signed)
on Memantine 10mg  bid, high functional, resides in SNF [facility] for supportive care

## 2021-01-16 ENCOUNTER — Encounter (INDEPENDENT_AMBULATORY_CARE_PROVIDER_SITE_OTHER): Payer: Self-pay

## 2021-01-16 DIAGNOSIS — H04123 Dry eye syndrome of bilateral lacrimal glands: Secondary | ICD-10-CM | POA: Diagnosis not present

## 2021-01-16 DIAGNOSIS — H18513 Endothelial corneal dystrophy, bilateral: Secondary | ICD-10-CM | POA: Diagnosis not present

## 2021-01-16 DIAGNOSIS — H43812 Vitreous degeneration, left eye: Secondary | ICD-10-CM | POA: Diagnosis not present

## 2021-01-16 DIAGNOSIS — D3131 Benign neoplasm of right choroid: Secondary | ICD-10-CM | POA: Diagnosis not present

## 2021-01-16 DIAGNOSIS — Z9889 Other specified postprocedural states: Secondary | ICD-10-CM | POA: Diagnosis not present

## 2021-01-16 DIAGNOSIS — H31091 Other chorioretinal scars, right eye: Secondary | ICD-10-CM | POA: Diagnosis not present

## 2021-01-16 DIAGNOSIS — H0102A Squamous blepharitis right eye, upper and lower eyelids: Secondary | ICD-10-CM | POA: Diagnosis not present

## 2021-01-16 DIAGNOSIS — H0102B Squamous blepharitis left eye, upper and lower eyelids: Secondary | ICD-10-CM | POA: Diagnosis not present

## 2021-01-16 DIAGNOSIS — Z961 Presence of intraocular lens: Secondary | ICD-10-CM | POA: Diagnosis not present

## 2021-01-29 ENCOUNTER — Encounter: Payer: Self-pay | Admitting: Nurse Practitioner

## 2021-01-29 ENCOUNTER — Non-Acute Institutional Stay (SKILLED_NURSING_FACILITY): Payer: Medicare PPO | Admitting: Nurse Practitioner

## 2021-01-29 DIAGNOSIS — M159 Polyosteoarthritis, unspecified: Secondary | ICD-10-CM | POA: Diagnosis not present

## 2021-01-29 DIAGNOSIS — G40909 Epilepsy, unspecified, not intractable, without status epilepticus: Secondary | ICD-10-CM | POA: Diagnosis not present

## 2021-01-29 DIAGNOSIS — L853 Xerosis cutis: Secondary | ICD-10-CM | POA: Diagnosis not present

## 2021-01-29 DIAGNOSIS — F0390 Unspecified dementia without behavioral disturbance: Secondary | ICD-10-CM | POA: Diagnosis not present

## 2021-01-29 DIAGNOSIS — E782 Mixed hyperlipidemia: Secondary | ICD-10-CM | POA: Diagnosis not present

## 2021-01-29 DIAGNOSIS — K5901 Slow transit constipation: Secondary | ICD-10-CM | POA: Diagnosis not present

## 2021-01-29 DIAGNOSIS — J431 Panlobular emphysema: Secondary | ICD-10-CM | POA: Diagnosis not present

## 2021-01-29 DIAGNOSIS — I10 Essential (primary) hypertension: Secondary | ICD-10-CM

## 2021-01-29 DIAGNOSIS — R609 Edema, unspecified: Secondary | ICD-10-CM

## 2021-01-29 NOTE — Assessment & Plan Note (Signed)
takes Senokot, MiraLax.

## 2021-01-29 NOTE — Assessment & Plan Note (Signed)
c/o left/right eye discomfort. The patient actually denied eye itching, pain, or change of vision, pending Ophthalmology f/u 02/20/21. The patient did admit her lower eyelids skin feels irritated, it appears scaly, but no lesion, open ares, or s/s of cellulitis. Will apply Vaseline to eyelids away from eyelashes/eyes. Observe.

## 2021-01-29 NOTE — Progress Notes (Signed)
Location:   [facility] Room Number: 29-A Place of Service:  SNF (31) Provider:  Denzil Bristol, NP    Patient Care Team: [name], MD as PCP - General (Internal Medicine) [name], MD as Consulting Physician (Ophthalmology) [name], MD as Consulting Physician (Cardiology) [name], MD as Consulting Physician (Dermatology) [name], MD as Consulting Physician (Orthopedic Surgery) [name], MD as Consulting Physician (Orthopedic Surgery) [name], MD as Consulting Physician (Orthopedic Surgery) [place], [place] Zonnie Landen X, NP as Nurse Practitioner (Nurse Practitioner) [name], MD as Consulting Physician (Neurology)  Extended Emergency Contact Information Primary Emergency Contact: [name],[name] Address: [address]          [place], [place] [address] [name] of [place] Phone: [phone] Relation: Sister Secondary Emergency Contact: [name] [place] of [place] Phone: [phone] Mobile Phone: [phone] Relation: Sister  Code Status:  DNR Goals of care: Advanced Directive information Advanced Directives 01/29/2021  Does Patient Have a Medical Advance Directive? Yes  Type of Advance Directive Out of facility DNR (pink MOST or yellow form)  Does patient want to make changes to medical advance directive? No - Patient declined  Copy of [place] in Chart? -  Pre-existing out of facility DNR order (yellow form or pink MOST form) -     Chief Complaint  Patient presents with   Acute Visit    Complains of right and left eye discomfort.    HPI:  Pt is a 87 y.o. female seen today for an acute visit for c/o left/right eye discomfort. The patient actually denied eye itching, pain, or change of vision, pending Ophthalmology f/u 02/20/21. The patient did admit her lower eyelids skin feels irritated, it appears scaly, but no lesion,  open ares, or s/s of cellulitis.   COVID infection, s/p Paxlovid, recovered.             A closed nondisplaced transverse fracture left patella 04/26/20, f/u Ortho s/p  ORIF.  prn Oxycodone for pain. Walking with walker.              GERD ST eval significant reflux as results of mucus production, oropharyngeal dysphagia. Takes  Omeprazole. Hgb 14.2 06/08/20, improved.              Restless leg symptom, takes MiraPex 0.$RemoveBeforeD'5mg'[id number]$  hs             Hx of dementia,  on Memantine              Hx of seizures, stable, on Keppra, Lamictal             Edema, trace, on Furosemide,  Bun/creat 17/0.99 eGFR 60 08/03/20             OA in general, on Tylenol, Oxycodone             HTN, takes Amlodipine. Bun/creat 17/1.0 eGFR 52 08/04/20             COPD, takes Spiriva qd, prn Albuterol HFA             Constipation, takes Senokot, MiraLax.              OP, stopped Foxamax per her request.              Hyperlipidemia, takes Simvastatin, LDL 88 11/23/20  Past Medical History:  Diagnosis Date  Acute bronchitis 05/23/2011   Acute upper respiratory infections of unspecified site 05/23/2011   Arthritis    Chronic airway obstruction, not elsewhere classified 05/23/2011   Disturbance of salivary secretion 01/31/2011   Dizziness and giddiness 01/31/2011   Dyspnea    Essential tremor 04/25/2014   External hemorrhoids without mention of complication 13/11/6576   Gait disorder 04/25/2014   Insomnia, unspecified 09/12/2011   Lumbago 01/31/2011   Major depressive disorder, single episode, unspecified 01/31/2011   Memory disorder 04/25/2014   Mitral valve disorders(424.0) 01/31/2011   Other and unspecified hyperlipidemia 01/31/2011   Other convulsions 01/31/2011   Other emphysema ([facility]) 01/31/2011   Pain in joint, site unspecified 01/31/2011   Restless legs syndrome (RLS) 09/12/2011   Retinal detachment with retinal defect of right eye 2011   right eye twice   Seizure disorder ([place])    Senile osteoporosis 01/31/2011   Spontaneous  ecchymoses 01/31/2011   Stiffness of joints, not elsewhere classified, multiple sites 01/31/2011   Unspecified essential hypertension 01/31/2011   Past Surgical History:  Procedure Laterality Date   ABDOMINAL HYSTERECTOMY  06/21/2003   TAH/BSO, omenectomy PSB resect, Stg IC cystadenofibroma   CHOLECYSTECTOMY  2005   Dr. Marlou Starks   ELBOW SURGERY Right 2008   broken   Dr. Apolonio Schneiders   EYE SURGERY     ORIF PATELLA Left 05/02/2020   Procedure: OPEN REDUCTION INTERNAL (ORIF) FIXATION LEFT PATELLA WITH MEDIAL AND LATERAL LIGAMENT REINFORCEMENTS;  Surgeon: [name], MD;  Location: [facility];  Service: Orthopedics;  Laterality: Left;   ORIF PATELLA Left 05/30/2020   Procedure: OPEN REDUCTION INTERNAL (ORIF) FIXATION PATELLA;  Surgeon: [name], MD;  Location: [facility];  Service: Orthopedics;  Laterality: Left;   RETINAL DETACHMENT SURGERY N/A    two   REVERSE SHOULDER ARTHROPLASTY Left 05/06/2019   Procedure: REVERSE SHOULDER ARTHROPLASTY;  Surgeon: Justice Britain, MD;  Location: [facility];  Service: Orthopedics;  Laterality: Left;  139min   ROTATOR CUFF REPAIR Right 2012   Dr. Theda Sers   SQUAMOUS CELL CARCINOMA EXCISION Bilateral 2012, 8/14   [place] on legs   Dr. Sarajane Jews   TONSILLECTOMY  1941   VIDEO BRONCHOSCOPY WITH ENDOBRONCHIAL NAVIGATION N/A 11/29/2015   Procedure: VIDEO BRONCHOSCOPY WITH ENDOBRONCHIAL NAVIGATION;  Surgeon: Collene Gobble, MD;  Location: [facility];  Service: Thoracic;  Laterality: N/A;    Allergies  Allergen Reactions   Dyazide [Hydrochlorothiazide W-Triamterene]     Other reaction(s): her blood pressure too much   Latex Other (See Comments)    Swelling   Other     Other reaction(s): local red reaction    Rofecoxib     Other reaction(s): shortness of breath   Sulfa Antibiotics Nausea And Vomiting   Tetracycline Hcl     Other reaction(s): Cant take due to a drug interation    Allergies as of 01/29/2021       Reactions   Dyazide [hydrochlorothiazide W-triamterene]     Other reaction(s): her blood pressure too much   Latex Other (See Comments)   Swelling   Other    Other reaction(s): local red reaction   Rofecoxib    Other reaction(s): shortness of breath   Sulfa Antibiotics Nausea And Vomiting   Tetracycline Hcl    Other reaction(s): Cant take due to a drug interation        Medication List        Accurate as of January 29, 2021 11:59 PM. If you have any questions, ask your nurse  or doctor.          acetaminophen 500 MG tablet Commonly known as: TYLENOL Take 2 tablets (1,000 mg total) by mouth every 6 (six) hours as needed.   amLODipine 2.5 MG tablet Commonly known as: NORVASC Take 2.5 mg by mouth daily.   aspirin 81 MG chewable tablet Chew 81 mg by mouth daily.   CeraVe Crea Apply 1 application topically daily. Mix cream with Triamcinolone   chlorhexidine 0.12 % solution Commonly known as: PERIDEX Use as directed 15 mLs in the mouth or throat at bedtime.   cholecalciferol 1000 units tablet Commonly known as: VITAMIN D Take 1 tablet (1,000 Units total) by mouth daily.   furosemide 20 MG tablet Commonly known as: LASIX Take 20 mg by mouth daily.   hydrocortisone 2.5 % lotion Apply 1 application topically daily as needed (psoriasis).   hydrocortisone cream 1 % Apply 1 application topically 2 (two) times daily as needed for itching.   ketoconazole 2 % shampoo Commonly known as: NIZORAL Apply 1 application topically daily.   ketoconazole 2 % cream Commonly known as: NIZORAL Apply 1 application topically as needed (for psoriasis).   lamoTRIgine 150 MG tablet Commonly known as: LAMICTAL Take 150 mg by mouth 2 (two) times daily.   levETIRAcetam 500 MG tablet Commonly known as: KEPPRA Take 500 mg by mouth at bedtime.   levETIRAcetam 250 MG tablet Commonly known as: KEPPRA Take 250 mg by mouth daily. In the morning   memantine 10 MG tablet Commonly known as: NAMENDA TAKE 1 TABLET BY MOUTH TWICE DAILY.    nystatin powder Commonly known as: MYCOSTATIN/NYSTOP Apply 1 application topically 2 (two) times daily as needed (tinea cruris).   ofloxacin 0.3 % ophthalmic solution Commonly known as: OCUFLOX Place 1 drop into the left eye 4 (four) times daily.   oxyCODONE 5 MG immediate release tablet Commonly known as: Oxy IR/ROXICODONE Take 1 tablet (5 mg total) by mouth every 6 (six) hours as needed for severe pain.   pantoprazole 40 MG tablet Commonly known as: PROTONIX Take 40 mg by mouth daily.   polyethylene glycol 17 g packet Commonly known as: MIRALAX / GLYCOLAX Take 17 g by mouth daily.   Potassium Chloride ER 20 MEQ Tbcr Take 20 mEq by mouth daily.   pramipexole 0.25 MG tablet Commonly known as: MIRAPEX Take 0.5 mg by mouth at bedtime.   Robitussin Cough+ Chest Max St 10-200 MG/5ML Liqd Generic drug: Dextromethorphan-guaiFENesin Take 5 mLs by mouth daily as needed.   sennosides-docusate sodium 8.6-50 MG tablet Commonly known as: SENOKOT-S Take 2 tablets by mouth at bedtime.   simvastatin 10 MG tablet Commonly known as: ZOCOR Take 10 mg by mouth at bedtime.   tiotropium 18 MCG inhalation capsule Commonly known as: Spiriva HandiHaler INHALE CONTENTS OF ONE CAPSULE ONCE DAILY FOR COPD.   triamcinolone cream 0.1 % Commonly known as: KENALOG Apply 1 application topically daily. Mix with cerave        Review of Systems  Constitutional:  Negative for activity change, appetite change and fever.  HENT:  Positive for hearing loss. Negative for trouble swallowing.   Eyes:  Negative for photophobia, pain, discharge, redness, itching and visual disturbance.       Residual left lower eyelid stye.   Respiratory:  Negative for cough.        DOE is chronic  Cardiovascular:  Positive for leg swelling. Negative for chest pain and palpitations.  Gastrointestinal:  Negative for abdominal pain and constipation.  Acid reflux symptoms.   Genitourinary:  Negative for dysuria  and urgency.  Musculoskeletal:  Positive for arthralgias, gait problem and joint swelling.       ORIF of the left patella.   Skin:  Negative for color change.       BLE discoloration, mild erythema BLE and left knee. Dry scaly skin eyelids, lower>upper, no lesion, cellulitis, or open area on eyelids.   Neurological:  Positive for seizures. Negative for speech difficulty, weakness and headaches.       Memory lapses. Hx of seizures. RLS  Psychiatric/Behavioral:  Negative for behavioral problems and sleep disturbance. The patient is not nervous/anxious.    Immunization History  Administered Date(s) Administered   Influenza Split 01/06/2014, 01/15/2017, 01/08/2018, 12/09/2018   Influenza Whole 01/07/2012, 01/06/2013   Influenza, High Dose Seasonal PF 12/25/2015, 01/20/2017   Influenza,inj,Quad PF,6+ Mos 12/21/2014   Influenza-Unspecified 12/09/2018, 01/18/2020   Moderna Sars-Covid-2 Vaccination 04/12/2019, 06/05/2019, 02/15/2020, 09/05/2020   Pneumococcal Conjugate-13 02/01/2014   Pneumococcal Polysaccharide-23 12/20/1992, 01/15/2000, 04/08/2004, 06/08/2004   Td 04/08/2002, 04/22/2002   Tdap 04/09/2011, 08/17/2015   Zoster Recombinat (Shingrix) 05/29/2005, 08/27/2017   Zoster, Live 04/08/2008, 05/29/2014, 05/28/2017   Zoster, Unspecified 05/29/2005   Pertinent  Health Maintenance Due  Topic Date Due   INFLUENZA VACCINE  11/06/2020   DEXA SCAN  Completed   MAMMOGRAM  Discontinued   Fall Risk  03/24/2014 10/15/2012  Falls in the past year? No No   Functional Status Survey:    Vitals:   01/29/21 1444  BP: 132/88  Pulse: 83  Resp: 18  Temp: 98.1 F (36.7 C)  SpO2: 94%  Weight: 160 lb 14.4 oz (73 kg)  Height: 5' 5.5" (1.664 m)   Body mass index is 26.37 kg/m. Physical Exam Vitals and nursing note reviewed.  Constitutional:      Appearance: Normal appearance.  HENT:     Head: Normocephalic and atraumatic.     Nose: Nose normal.     Mouth/Throat:     Mouth: Mucous  membranes are moist.  Eyes:     Extraocular Movements: Extraocular movements intact.     Conjunctiva/sclera:     Right eye: Right conjunctiva is not injected.     Left eye: Left conjunctiva is not injected.     Pupils: Pupils are equal, round, and reactive to light.     Comments: Residual lateral left lower eyelid stye.   Neck:     Comments: Left adam's apple bony aspect sore/discomfort when palpated, no redness, warmth, swelling, or enlarged lymph nodes noted.  Cardiovascular:     Rate and Rhythm: Normal rate and regular rhythm.     Heart sounds: Murmur heard.     Comments: PD pulses are not felt.  Pulmonary:     Effort: Pulmonary effort is normal.     Breath sounds: Rales present.     Comments: Decreased air entry to both lungs. Bibasilar rales.  Abdominal:     General: Bowel sounds are normal.     Palpations: Abdomen is soft.     Tenderness: There is no abdominal tenderness.     Comments: Mid abd surgical scar  Musculoskeletal:        General: No tenderness.     Cervical back: Normal range of motion and neck supple.     Right lower leg: Edema present.     Left lower leg: Edema present.     Comments:  Decreased overhead ROM of the left shoulder. BLE edema trace  is  chronic. Left knee s/p ORIF of the patella, mild warmth and swelling comparing to the right knee. BLE discoloration  Skin:    General: Skin is warm and dry.     Comments: Brownish skin discoloration BLE. Scaly irritated skin appearance eyelids, more noticeable lower eye lids, no rash, cellulitis, lesion, or open areas.   Neurological:     General: No focal deficit present.     Mental Status: She is alert and oriented to person, place, and time. Mental status is at baseline.     Gait: Gait abnormal.  Psychiatric:        Mood and Affect: Mood normal.        Behavior: Behavior normal.        Thought Content: Thought content normal.    Labs reviewed: Recent Labs    05/02/20 0630 05/26/20 0935 06/08/20 0000  06/13/20 0000 08/04/20 1205  NA 140 141 143 142 142  K 3.5 3.9 4.1 3.9 4.6  CL 104 104 105 108 104  CO2 25 26 31* 24* 27*  GLUCOSE 99 86  --   --   --   BUN 25* 27* 24* 22* 17  CREATININE 0.97 0.94 1.0 0.9 1.0  CALCIUM 9.2 9.5 9.4 9.6 10.0   Recent Labs    06/08/20 0000  AST 16  ALT 9  ALKPHOS 138*  ALBUMIN 3.9   Recent Labs    05/02/20 0630 05/26/20 0935 06/08/20 0000  WBC 6.3 5.7 5.5  NEUTROABS  --   --  3,515.00  HGB 13.6 14.7 14.2  HCT 42.6 46.5* 43  MCV 89.7 91.7  --   PLT 223 253 247   Lab Results  Component Value Date   TSH 1.79 06/29/2019   No results found for: HGBA1C Lab Results  Component Value Date   CHOL 169 11/23/2020   HDL 64 11/23/2020   LDLCALC 88 11/23/2020   TRIG 80 11/23/2020   CHOLHDL 3.6 06/27/2015    Significant Diagnostic Results in last 30 days:  No results found.  Assessment/Plan Dry skin dermatitis c/o left/right eye discomfort. The patient actually denied eye itching, pain, or change of vision, pending Ophthalmology f/u 02/20/21. The patient did admit her lower eyelids skin feels irritated, it appears scaly, but no lesion, open ares, or s/s of cellulitis. Will apply Vaseline to eyelids away from eyelashes/eyes. Observe.   Senile dementia ([place]) No behavioral issues. on Memantine   Seizure disorder ([facility]) stable, on Keppra, Lamictal  Edema trace, on Furosemide,  Bun/creat 17/0.99 eGFR 60 08/03/20  Generalized osteoarthritis of multiple sites  on Tylenol, Oxycodone  Essential hypertension Blood pressure is controlled,  takes Amlodipine. Bun/creat 17/1.0 eGFR 52 08/04/20  COPD (chronic obstructive pulmonary disease) ([facility])  takes Spiriva qd, prn Albuterol HFA  Slow transit constipation  takes Senokot, MiraLax.   Hyperlipidemia  takes Simvastatin, LDL 88 11/23/20    Family/ staff Communication: plan of care reviewed with the patient and charge nurse.   Labs/tests ordered:  none  Time spend 35 minutes.

## 2021-01-29 NOTE — Assessment & Plan Note (Signed)
on Tylenol, Oxycodone

## 2021-01-29 NOTE — Assessment & Plan Note (Signed)
trace, on Furosemide,  Bun/creat 17/0.99 eGFR 60 08/03/20 

## 2021-01-29 NOTE — Assessment & Plan Note (Signed)
No behavioral issues. on Memantine

## 2021-01-29 NOTE — Assessment & Plan Note (Signed)
Blood pressure is controlled,  takes Amlodipine. Bun/creat 17/1.0 eGFR 52 08/04/20

## 2021-01-29 NOTE — Assessment & Plan Note (Signed)
takes Simvastatin, LDL 88 11/23/20

## 2021-01-29 NOTE — Assessment & Plan Note (Signed)
takes Spiriva qd, prn Albuterol HFA

## 2021-01-29 NOTE — Assessment & Plan Note (Signed)
stable, on Keppra, Lamictal 

## 2021-01-30 ENCOUNTER — Encounter: Payer: Self-pay | Admitting: Nurse Practitioner

## 2021-02-13 ENCOUNTER — Non-Acute Institutional Stay (SKILLED_NURSING_FACILITY): Payer: Medicare PPO | Admitting: Internal Medicine

## 2021-02-13 ENCOUNTER — Encounter: Payer: Self-pay | Admitting: Internal Medicine

## 2021-02-13 DIAGNOSIS — G40909 Epilepsy, unspecified, not intractable, without status epilepticus: Secondary | ICD-10-CM

## 2021-02-13 DIAGNOSIS — R609 Edema, unspecified: Secondary | ICD-10-CM | POA: Diagnosis not present

## 2021-02-13 DIAGNOSIS — M159 Polyosteoarthritis, unspecified: Secondary | ICD-10-CM | POA: Diagnosis not present

## 2021-02-13 DIAGNOSIS — G2581 Restless legs syndrome: Secondary | ICD-10-CM

## 2021-02-13 DIAGNOSIS — J410 Simple chronic bronchitis: Secondary | ICD-10-CM | POA: Diagnosis not present

## 2021-02-13 DIAGNOSIS — M810 Age-related osteoporosis without current pathological fracture: Secondary | ICD-10-CM

## 2021-02-13 DIAGNOSIS — K219 Gastro-esophageal reflux disease without esophagitis: Secondary | ICD-10-CM

## 2021-02-13 DIAGNOSIS — E782 Mixed hyperlipidemia: Secondary | ICD-10-CM | POA: Diagnosis not present

## 2021-02-13 DIAGNOSIS — I10 Essential (primary) hypertension: Secondary | ICD-10-CM | POA: Diagnosis not present

## 2021-02-13 NOTE — Progress Notes (Signed)
Location:   [facility] Room Number: 29 Place of Service:  SNF [phone]) Provider:  Veleta Miners MD  [name], MD  Patient Care Team: [name], MD as PCP - General (Internal Medicine) [name], MD as Consulting Physician (Ophthalmology) [name], MD as Consulting Physician (Cardiology) [name], MD as Consulting Physician (Dermatology) [name], MD as Consulting Physician (Orthopedic Surgery) [name], MD as Consulting Physician (Orthopedic Surgery) [name], MD as Consulting Physician (Orthopedic Surgery) [place], [place] Mast, Man X, NP as Nurse Practitioner (Nurse Practitioner) [name], MD as Consulting Physician (Neurology)  Extended Emergency Contact Information Primary Emergency Contact: [name],[name] Address: [address]          [place], [place] [address] [name] of [place] Phone: [phone] Relation: Sister Secondary Emergency Contact: [name] [place] Phone: [phone] Mobile Phone: [phone] Relation: Sister  Code Status:  DNR Managed Care Goals of care: Advanced Directive information Advanced Directives 02/13/2021  Does Patient Have a Medical Advance Directive? Yes  Type of Advance Directive Out of facility DNR (pink MOST or yellow form)  Does patient want to make changes to medical advance directive? No - Patient declined  Copy of [place] in Chart? -  Pre-existing out of facility DNR order (yellow form or pink MOST form) Yellow form placed in chart (order not valid for inpatient use);Pink MOST form placed in chart (order not valid for inpatient use)     Chief Complaint  Patient presents with   Medical Management of Chronic Issues    HPI:  Pt is a 87 y.o. female seen today for medical management of chronic diseases.    Patient also has h/o COPD, Hypertension,Seizure Disorder,Mild  Cognitive impairment, Hyperlipidemia Chronic Venous Changes in LE  Left Humerus Fracture with Displacement 1/22 Sustained Left Patellar Fracture due to Mechanical fall Underwent ORIF on 1/25  Lives In [facility] with her walker Weight is good Her only complain today was some wheezing in the morning She says she was on Proair at some time and can she restart that Denies Cough or SOB or fever Doe shave swelling in her legs but stable  Past Medical History:  Diagnosis Date   Acute bronchitis 05/23/2011   Acute upper respiratory infections of unspecified site 05/23/2011   Arthritis    Chronic airway obstruction, not elsewhere classified 05/23/2011   Disturbance of salivary secretion 01/31/2011   Dizziness and giddiness 01/31/2011   Dyspnea    Essential tremor 04/25/2014   External hemorrhoids without mention of complication 09/62/8366   Gait disorder 04/25/2014   Insomnia, unspecified 09/12/2011   Lumbago 01/31/2011   Major depressive disorder, single episode, unspecified 01/31/2011   Memory disorder 04/25/2014   Mitral valve disorders(424.0) 01/31/2011   Other and unspecified hyperlipidemia 01/31/2011   Other convulsions 01/31/2011   Other emphysema ([place]) 01/31/2011   Pain in joint, site unspecified 01/31/2011   Restless legs syndrome (RLS) 09/12/2011   Retinal detachment with retinal defect of right eye 2011   right eye twice   Seizure disorder ([place])    Senile osteoporosis 01/31/2011   Spontaneous ecchymoses 01/31/2011   Stiffness of joints, not elsewhere classified, multiple sites 01/31/2011   Unspecified essential hypertension 01/31/2011   Past Surgical History:  Procedure Laterality Date   ABDOMINAL HYSTERECTOMY  06/21/2003   TAH/BSO, omenectomy PSB resect, Stg IC cystadenofibroma  CHOLECYSTECTOMY  2005   Dr. Marlou Starks   ELBOW SURGERY Right 2008   broken   Dr. Apolonio Schneiders   EYE SURGERY     ORIF PATELLA Left 05/02/2020   Procedure: OPEN REDUCTION INTERNAL (ORIF) FIXATION LEFT PATELLA  WITH MEDIAL AND LATERAL LIGAMENT REINFORCEMENTS;  Surgeon: [name], MD;  Location: [facility];  Service: Orthopedics;  Laterality: Left;   ORIF PATELLA Left 05/30/2020   Procedure: OPEN REDUCTION INTERNAL (ORIF) FIXATION PATELLA;  Surgeon: [name], MD;  Location: [facility];  Service: Orthopedics;  Laterality: Left;   RETINAL DETACHMENT SURGERY N/A    two   REVERSE SHOULDER ARTHROPLASTY Left 05/06/2019   Procedure: REVERSE SHOULDER ARTHROPLASTY;  Surgeon: Justice Britain, MD;  Location: [facility];  Service: Orthopedics;  Laterality: Left;  120min   ROTATOR CUFF REPAIR Right 2012   Dr. Theda Sers   SQUAMOUS CELL CARCINOMA EXCISION Bilateral 2012, 8/14   [place] on legs   Dr. Sarajane Jews   TONSILLECTOMY  1941   VIDEO BRONCHOSCOPY WITH ENDOBRONCHIAL NAVIGATION N/A 11/29/2015   Procedure: VIDEO BRONCHOSCOPY WITH ENDOBRONCHIAL NAVIGATION;  Surgeon: Collene Gobble, MD;  Location: [facility];  Service: Thoracic;  Laterality: N/A;    Allergies  Allergen Reactions   Dyazide [Hydrochlorothiazide W-Triamterene]     Other reaction(s): her blood pressure too much   Latex Other (See Comments)    Swelling   Other     Other reaction(s): local red reaction    Rofecoxib     Other reaction(s): shortness of breath   Sulfa Antibiotics Nausea And Vomiting   Tetracycline Hcl     Other reaction(s): Cant take due to a drug interation    Allergies as of 02/13/2021       Reactions   Dyazide [hydrochlorothiazide W-triamterene]    Other reaction(s): her blood pressure too much   Latex Other (See Comments)   Swelling   Other    Other reaction(s): local red reaction   Rofecoxib    Other reaction(s): shortness of breath   Sulfa Antibiotics Nausea And Vomiting   Tetracycline Hcl    Other reaction(s): Cant take due to a drug interation        Medication List        Accurate as of February 13, 2021 11:44 AM. If you have any questions, ask your nurse or doctor.          STOP taking these medications     ofloxacin 0.3 % ophthalmic solution Commonly known as: OCUFLOX Stopped by: [name], MD       TAKE these medications    acetaminophen 500 MG tablet Commonly known as: TYLENOL Take 2 tablets (1,000 mg total) by mouth every 6 (six) hours as needed.   amLODipine 2.5 MG tablet Commonly known as: NORVASC Take 2.5 mg by mouth daily.   aspirin 81 MG chewable tablet Chew 81 mg by mouth daily.   CeraVe Crea Apply 1 application topically daily. Mix cream with Triamcinolone   chlorhexidine 0.12 % solution Commonly known as: PERIDEX Use as directed 15 mLs in the mouth or throat at bedtime.   cholecalciferol 1000 units tablet Commonly known as: VITAMIN D Take 1 tablet (1,000 Units total) by mouth daily.   furosemide 20 MG tablet Commonly known as: LASIX Take 20 mg by mouth daily.   hydrocortisone 2.5 % lotion Apply 1 application topically daily as needed (psoriasis).   hydrocortisone cream 1 % Apply 1 application topically 2 (two) times daily as needed for itching.  ketoconazole 2 % shampoo Commonly known as: NIZORAL Apply 1 application topically daily.   ketoconazole 2 % cream Commonly known as: NIZORAL Apply 1 application topically as needed (for psoriasis).   lamoTRIgine 150 MG tablet Commonly known as: LAMICTAL Take 150 mg by mouth 2 (two) times daily.   levETIRAcetam 500 MG tablet Commonly known as: KEPPRA Take 500 mg by mouth at bedtime.   levETIRAcetam 250 MG tablet Commonly known as: KEPPRA Take 250 mg by mouth daily. In the morning   memantine 10 MG tablet Commonly known as: NAMENDA TAKE 1 TABLET BY MOUTH TWICE DAILY.   nystatin powder Commonly known as: MYCOSTATIN/NYSTOP Apply 1 application topically 2 (two) times daily as needed (tinea cruris).   oxyCODONE 5 MG immediate release tablet Commonly known as: Oxy IR/ROXICODONE Take 1 tablet (5 mg total) by mouth every 6 (six) hours as needed for severe pain.   pantoprazole 40 MG  tablet Commonly known as: PROTONIX Take 40 mg by mouth daily.   polyethylene glycol 17 g packet Commonly known as: MIRALAX / GLYCOLAX Take 17 g by mouth daily.   Potassium Chloride ER 20 MEQ Tbcr Take 20 mEq by mouth daily.   pramipexole 0.25 MG tablet Commonly known as: MIRAPEX Take 0.5 mg by mouth at bedtime.   Robitussin Cough+ Chest Max St 10-200 MG/5ML Liqd Generic drug: Dextromethorphan-guaiFENesin Take 5 mLs by mouth daily as needed.   sennosides-docusate sodium 8.6-50 MG tablet Commonly known as: SENOKOT-S Take 2 tablets by mouth at bedtime.   simvastatin 10 MG tablet Commonly known as: ZOCOR Take 10 mg by mouth at bedtime.   tiotropium 18 MCG inhalation capsule Commonly known as: Spiriva HandiHaler INHALE CONTENTS OF ONE CAPSULE ONCE DAILY FOR COPD.   triamcinolone cream 0.1 % Commonly known as: KENALOG Apply 1 application topically daily. Mix with cerave        Review of Systems  Constitutional: Negative.   HENT: Negative.    Respiratory: Negative.    Cardiovascular:  Positive for leg swelling.  Gastrointestinal: Negative.   Genitourinary: Negative.   Musculoskeletal: Negative.   Skin: Negative.   Neurological: Negative.   Psychiatric/Behavioral: Negative.     Immunization History  Administered Date(s) Administered   Influenza Split 01/06/2014, 01/15/2017, 01/08/2018, 12/09/2018   Influenza Whole 01/07/2012, 01/06/2013   Influenza, High Dose Seasonal PF 12/25/2015, 01/20/2017   Influenza,inj,Quad PF,6+ Mos 12/21/2014   Influenza-Unspecified 12/09/2018, 01/18/2020, 01/24/2021   Moderna Sars-Covid-2 Vaccination 04/12/2019, 06/05/2019, 02/15/2020, 09/05/2020   PFIZER(Purple Top)SARS-COV-2 Vaccination 12/26/2020   Pneumococcal Conjugate-13 02/01/2014   Pneumococcal Polysaccharide-23 12/20/1992, 01/15/2000, 04/08/2004, 06/08/2004   Td 04/08/2002, 04/22/2002   Tdap 04/09/2011, 08/17/2015   Zoster Recombinat (Shingrix) 05/29/2005, 08/27/2017    Zoster, Live 04/08/2008, 05/29/2014, 05/28/2017   Zoster, Unspecified 05/29/2005   Pertinent  Health Maintenance Due  Topic Date Due   INFLUENZA VACCINE  Completed   DEXA SCAN  Completed   MAMMOGRAM  Discontinued   Fall Risk 05/12/2019 05/12/2019 05/13/2019 05/13/2019 04/26/2020  Falls in the past year? - - - - -  Patient Fall Risk Level High fall risk High fall risk High fall risk High fall risk Low fall risk   Functional Status Survey:    Vitals:   02/13/21 1137  BP: 127/73  Pulse: 73  Resp: 18  Temp: 97.8 F (36.6 C)  SpO2: 96%  Weight: 160 lb 3.2 oz (72.7 kg)  Height: 5\' 2"  (1.575 m)   Body mass index is 29.3 kg/m. Physical Exam Constitutional: Oriented to person, place,  and time. Well-developed and well-nourished.  HENT:  Head: Normocephalic.  Mouth/Throat: Oropharynx is clear and moist.  Eyes: Pupils are equal, round, and reactive to light.  Neck: Neck supple.  Cardiovascular: Normal rate and normal heart sounds.  No murmur heard. Pulmonary/Chest: Effort normal and breath sounds normal. No respiratory distress. No wheezes. She has no rales.  Abdominal: Soft. Bowel sounds are normal. No distension. There is no tenderness. There is no rebound.  Musculoskeletal: Chronic Venous changes in her LE with Mild Edema .  Lymphadenopathy: none Neurological: Alert and oriented to person, place, and time. No Focal deficits Skin: Skin is warm and dry.  Psychiatric: Normal mood and affect. Behavior is normal. Thought content normal.   Labs reviewed: Recent Labs    05/02/20 0630 05/26/20 0935 06/08/20 0000 06/13/20 0000 08/04/20 1205  NA 140 141 143 142 142  K 3.5 3.9 4.1 3.9 4.6  CL 104 104 105 108 104  CO2 25 26 31* 24* 27*  GLUCOSE 99 86  --   --   --   BUN 25* 27* 24* 22* 17  CREATININE 0.97 0.94 1.0 0.9 1.0  CALCIUM 9.2 9.5 9.4 9.6 10.0   Recent Labs    06/08/20 0000  AST 16  ALT 9  ALKPHOS 138*  ALBUMIN 3.9   Recent Labs    05/02/20 0630 05/26/20 0935  06/08/20 0000  WBC 6.3 5.7 5.5  NEUTROABS  --   --  3,515.00  HGB 13.6 14.7 14.2  HCT 42.6 46.5* 43  MCV 89.7 91.7  --   PLT 223 253 247   Lab Results  Component Value Date   TSH 1.79 06/29/2019   No results found for: HGBA1C Lab Results  Component Value Date   CHOL 169 11/23/2020   HDL 64 11/23/2020   LDLCALC 88 11/23/2020   TRIG 80 11/23/2020   CHOLHDL 3.6 06/27/2015    Significant Diagnostic Results in last 30 days:  No results found.  Assessment/Plan Simple chronic bronchitis (HCC) Will start on Proair PRN Can keep in her Room Seizure disorder (HCC) On Lamictal and Keppra Edema, unspecified type Low dose of Lasix Essential hypertension On Norvasc Mixed hyperlipidemia Continue Statin Restless legs syndrome (RLS) On MIrapex Senile osteoporosis Was on Fosmax took herself of it and does not want to take anything anymore   Gastroesophageal reflux disease, unspecified whether esophagitis present Continue Protonix Generalized osteoarthritis of multiple sites On Oxycodone Dementia On Namenda Very High Functional  Family/ staff Communication:   Labs/tests ordered:

## 2021-02-20 ENCOUNTER — Ambulatory Visit (INDEPENDENT_AMBULATORY_CARE_PROVIDER_SITE_OTHER): Payer: Medicare PPO | Admitting: Ophthalmology

## 2021-02-20 ENCOUNTER — Other Ambulatory Visit: Payer: Self-pay

## 2021-02-20 ENCOUNTER — Encounter (INDEPENDENT_AMBULATORY_CARE_PROVIDER_SITE_OTHER): Payer: Self-pay | Admitting: Ophthalmology

## 2021-02-20 ENCOUNTER — Encounter (INDEPENDENT_AMBULATORY_CARE_PROVIDER_SITE_OTHER): Payer: Medicare PPO | Admitting: Ophthalmology

## 2021-02-20 DIAGNOSIS — H18513 Endothelial corneal dystrophy, bilateral: Secondary | ICD-10-CM | POA: Diagnosis not present

## 2021-02-20 DIAGNOSIS — Z8669 Personal history of other diseases of the nervous system and sense organs: Secondary | ICD-10-CM

## 2021-02-20 DIAGNOSIS — D3131 Benign neoplasm of right choroid: Secondary | ICD-10-CM | POA: Diagnosis not present

## 2021-02-20 DIAGNOSIS — Z961 Presence of intraocular lens: Secondary | ICD-10-CM | POA: Diagnosis not present

## 2021-02-20 NOTE — Progress Notes (Signed)
02/20/2021     CHIEF COMPLAINT Patient presents for  Chief Complaint  Patient presents with   Retina Evaluation      HISTORY OF PRESENT ILLNESS: [name] is a 87 y.o. female who presents to the clinic today for:   HPI     Retina Evaluation   In both eyes.  This started 3 months ago.  Duration of 3 months.  Context:  near vision.  Response to treatment was no improvement.        Comments   NP referred from Dr. Elliot Dally for evaluation with history of Choroidal nevus OD, PVD OS, Peripheral Chorioretinal scarring OD, and s/p RD repair OD.  Pt c/o needing to close her right eye when reading small print, started ~ 3 months ago.      Last edited by Reather Littler, COA on 02/20/2021  9:55 AM.      Referring physician: [name], MD [place] [address],   [id number]  HISTORICAL INFORMATION:   Selected notes from the [facility]: No current outpatient medications on file. (Ophthalmic Drugs)   No current facility-administered medications for this visit. (Ophthalmic Drugs)   Current Outpatient Medications (Other)  Medication Sig   acetaminophen (TYLENOL) 500 MG tablet Take 2 tablets (1,000 mg total) by mouth every 6 (six) hours as needed.   amLODipine (NORVASC) 2.5 MG tablet Take 2.5 mg by mouth daily.   aspirin 81 MG chewable tablet Chew 81 mg by mouth daily.   chlorhexidine (PERIDEX) 0.12 % solution Use as directed 15 mLs in the mouth or throat at bedtime.   cholecalciferol (VITAMIN D) 1000 units tablet Take 1 tablet (1,000 Units total) by mouth daily.   Dextromethorphan-guaiFENesin (ROBITUSSIN COUGH+ CHEST MAX ST) 10-200 MG/5ML LIQD Take 5 mLs by mouth daily as needed.   Emollient (CERAVE) CREA Apply 1 application topically daily. Mix cream with Triamcinolone   furosemide (LASIX) 20 MG tablet Take 20 mg by mouth daily.   hydrocortisone 2.5 % lotion Apply 1 application topically daily as needed (psoriasis).    hydrocortisone cream 1 % Apply 1 application topically 2 (two) times daily as needed for itching.   ketoconazole (NIZORAL) 2 % cream Apply 1 application topically as needed (for psoriasis).    ketoconazole (NIZORAL) 2 % shampoo Apply 1 application topically daily.   lamoTRIgine (LAMICTAL) 150 MG tablet Take 150 mg by mouth 2 (two) times daily.   levETIRAcetam (KEPPRA) 250 MG tablet Take 250 mg by mouth daily. In the morning   levETIRAcetam (KEPPRA) 500 MG tablet Take 500 mg by mouth at bedtime.   memantine (NAMENDA) 10 MG tablet TAKE 1 TABLET BY MOUTH TWICE DAILY.   nystatin (MYCOSTATIN/NYSTOP) powder Apply 1 application topically 2 (two) times daily as needed (tinea cruris).   oxyCODONE (OXY IR/ROXICODONE) 5 MG immediate release tablet Take 1 tablet (5 mg total) by mouth every 6 (six) hours as needed for severe pain.   pantoprazole (PROTONIX) 40 MG tablet Take 40 mg by mouth daily.   polyethylene glycol (MIRALAX / GLYCOLAX) 17 g packet Take 17 g by mouth daily.   Potassium Chloride ER 20 MEQ TBCR Take 20 mEq by mouth daily.   pramipexole (MIRAPEX) 0.25 MG tablet Take 0.5 mg by mouth at bedtime.   sennosides-docusate sodium (SENOKOT-S) 8.6-50 MG tablet Take 2 tablets by mouth at bedtime.   simvastatin (ZOCOR) 10 MG tablet Take 10 mg by mouth  at bedtime.   tiotropium (SPIRIVA HANDIHALER) 18 MCG inhalation capsule INHALE CONTENTS OF ONE CAPSULE ONCE DAILY FOR COPD.   triamcinolone (KENALOG) 0.1 % Apply 1 application topically daily. Mix with cerave   No current facility-administered medications for this visit. (Other)      REVIEW OF SYSTEMS:    ALLERGIES Allergies  Allergen Reactions   Dyazide [Hydrochlorothiazide W-Triamterene]     Other reaction(s): her blood pressure too much   Latex Other (See Comments)    Swelling   Other     Other reaction(s): local red reaction    Rofecoxib     Other reaction(s): shortness of breath   Sulfa Antibiotics Nausea And Vomiting    Tetracycline Hcl     Other reaction(s): Cant take due to a drug interation    PAST MEDICAL HISTORY Past Medical History:  Diagnosis Date   Acute bronchitis 05/23/2011   Acute upper respiratory infections of unspecified site 05/23/2011   Arthritis    Chronic airway obstruction, not elsewhere classified 05/23/2011   Disturbance of salivary secretion 01/31/2011   Dizziness and giddiness 01/31/2011   Dyspnea    Essential tremor 04/25/2014   External hemorrhoids without mention of complication 78/24/2353   Gait disorder 04/25/2014   Insomnia, unspecified 09/12/2011   Lumbago 01/31/2011   Major depressive disorder, single episode, unspecified 01/31/2011   Memory disorder 04/25/2014   Mitral valve disorders(424.0) 01/31/2011   Other and unspecified hyperlipidemia 01/31/2011   Other convulsions 01/31/2011   Other emphysema ([place]) 01/31/2011   Pain in joint, site unspecified 01/31/2011   Restless legs syndrome (RLS) 09/12/2011   Retinal detachment with retinal defect of right eye 2011   right eye twice   Seizure disorder ([place])    Senile osteoporosis 01/31/2011   Spontaneous ecchymoses 01/31/2011   Stiffness of joints, not elsewhere classified, multiple sites 01/31/2011   Unspecified essential hypertension 01/31/2011   Past Surgical History:  Procedure Laterality Date   ABDOMINAL HYSTERECTOMY  06/21/2003   TAH/BSO, omenectomy PSB resect, Stg IC cystadenofibroma   CHOLECYSTECTOMY  2005   Dr. Marlou Starks   ELBOW SURGERY Right 2008   broken   Dr. Apolonio Schneiders   EYE SURGERY     ORIF PATELLA Left 05/02/2020   Procedure: OPEN REDUCTION INTERNAL (ORIF) FIXATION LEFT PATELLA WITH MEDIAL AND LATERAL LIGAMENT REINFORCEMENTS;  Surgeon: [name], MD;  Location: [facility];  Service: Orthopedics;  Laterality: Left;   ORIF PATELLA Left 05/30/2020   Procedure: OPEN REDUCTION INTERNAL (ORIF) FIXATION PATELLA;  Surgeon: [name], MD;  Location: [facility];  Service: Orthopedics;  Laterality: Left;   RETINAL  DETACHMENT SURGERY N/A    two   REVERSE SHOULDER ARTHROPLASTY Left 05/06/2019   Procedure: REVERSE SHOULDER ARTHROPLASTY;  Surgeon: Justice Britain, MD;  Location: [facility];  Service: Orthopedics;  Laterality: Left;  124min   ROTATOR CUFF REPAIR Right 2012   Dr. Theda Sers   SQUAMOUS CELL CARCINOMA EXCISION Bilateral 2012, 8/14   [place] on legs   Dr. Sarajane Jews   TONSILLECTOMY  1941   VIDEO BRONCHOSCOPY WITH ENDOBRONCHIAL NAVIGATION N/A 11/29/2015   Procedure: VIDEO BRONCHOSCOPY WITH ENDOBRONCHIAL NAVIGATION;  Surgeon: Collene Gobble, MD;  Location: [facility];  Service: Thoracic;  Laterality: N/A;    FAMILY HISTORY Family History  Problem Relation Age of Onset   Heart disease Father        CHF   Cancer Mother        breast   Seizures Sister     SOCIAL HISTORY  Social History   Tobacco Use   Smoking status: Former    Packs/day: 1.00    Years: 40.00    Pack years: 40.00    Types: Cigarettes    Quit date: 04/08/1988    Years since quitting: 32.8   Smokeless tobacco: Never   Tobacco comments:    quit in 1990  Vaping Use   Vaping Use: Never used  Substance Use Topics   Alcohol use: No    Alcohol/week: 0.0 standard drinks   Drug use: No         OPHTHALMIC EXAM:  Base Eye Exam     Visual Acuity (ETDRS)       Right Left   Dist cc 20/25 -2 20/20    Correction: Glasses         Tonometry (Tonopen, 10:03 AM)       Right Left   Pressure 14 13         Pupils       Pupils Dark Light Shape React APD   Right PERRL 4 3 Round Brisk None   Left PERRL 4 3 Round Brisk None         Visual Fields (Counting fingers)       Left Right    Full Full         Extraocular Movement       Right Left    Full, Ortho Full, Ortho         Neuro/Psych     Oriented x3: Yes   Mood/Affect: Normal         Dilation     Both eyes: 1.0% Mydriacyl, 2.5% Phenylephrine @ 10:04 AM           Slit Lamp and Fundus Exam     External Exam       Right Left   External Normal  Normal         Slit Lamp Exam       Right Left   Lids/Lashes Normal Normal   Conjunctiva/Sclera White and quiet White and quiet   Cornea 1+ [place] Clear   Anterior Chamber Deep and quiet Deep and quiet   Iris Round and reactive Round and reactive   Lens Centered posterior chamber intraocular lens Centered posterior chamber intraocular lens   Anterior Vitreous Normal Normal         Fundus Exam       Right Left   Posterior Vitreous Normal Normal   Disc Normal Normal   C/D Ratio 0.4 0.4   Macula Normal flat choroidal nevus partially submacular, no high risk features Normal   Vessels Normal Normal   Periphery Choroidal nevus ST, flat no change Normal            IMAGING AND PROCEDURES  Imaging and Procedures for 02/20/21  OCT, Retina - OU - Both Eyes       Right Eye Quality was good. Scan locations included subfoveal. Central Foveal Thickness: 285. Progression has been stable. Findings include normal foveal contour.   Left Eye Quality was good. Scan locations included subfoveal. Central Foveal Thickness: 269. Progression has been stable. Findings include normal foveal contour.   Notes No active maculopathy OU     Color Fundus Photography Optos - OU - Both Eyes       Right Eye Progression has been stable. Disc findings include normal observations. Macula : normal observations. Vessels : normal observations.   Left Eye Progression has been stable. Disc findings include normal  observations. Macula : normal observations. Vessels : normal observations. Periphery : normal observations.   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  Superotemporal portion of the macula and outside the macula, flat choroidal nevus no high risk features OD  No interval change               ASSESSMENT/PLAN:  Choroidal nevus of right eye Flat, stable no interval change for 13 years, no high risk features  History of retinal  detachment Temporal and inferotemporal retinal detachment repaired via vitrectomy fluid gas exchange gas injection endolaser 2009, OD  Pseudophakia, both eyes As per Dr. Katy Fitch     ICD-10-CM   1. Choroidal nevus of right eye  D31.31 OCT, Retina - OU - Both Eyes    Color Fundus Photography Optos - OU - Both Eyes    2. Fuchs' corneal dystrophy of both eyes  H18.513     3. History of retinal detachment  Z86.69     4. Pseudophakia, both eyes  Z96.1       1.  OD, choroidal nevus stable over the years  2.  OD history of retinal detachment repaired, Dr. Zadie Rhine, here 2009 stable  3.  Ophthalmic Meds Ordered this visit:  No orders of the defined types were placed in this encounter.      Return in about 1 year (around 02/20/2022) for DILATE OU, COLOR FP, OCT.  There are no Patient Instructions on file for this visit.   Explained the diagnoses, plan, and follow up with the patient and they expressed understanding.  Patient expressed understanding of the importance of proper follow up care.   Clent Demark Cailen Texeira M.D. Diseases & Surgery of the Retina and Vitreous [facility]ina [facility] Diabetic [place] 02/20/21     Abbreviations: M myopia (nearsighted); A astigmatism; H hyperopia (farsighted); P presbyopia; Mrx spectacle prescription;  CTL contact lenses; OD right eye; OS left eye; OU both eyes  XT exotropia; ET esotropia; PEK punctate epithelial keratitis; PEE punctate epithelial erosions; DES dry eye syndrome; MGD meibomian gland dysfunction; ATs artificial tears; PFAT's preservative free artificial tears; [place] nuclear sclerotic cataract; PSC posterior subcapsular cataract; ERM epi-retinal membrane; PVD posterior vitreous detachment; RD retinal detachment; DM diabetes mellitus; DR diabetic retinopathy; NPDR non-proliferative diabetic retinopathy; PDR proliferative diabetic retinopathy; CSME clinically significant macular edema; DME diabetic macular edema; dbh dot blot hemorrhages; CWS cotton wool  spot; POAG primary open angle glaucoma; C/D cup-to-disc ratio; HVF humphrey visual field; GVF goldmann visual field; OCT optical coherence tomography; IOP intraocular pressure; BRVO Branch retinal vein occlusion; CRVO central retinal vein occlusion; CRAO central retinal artery occlusion; BRAO branch retinal artery occlusion; RT retinal tear; SB scleral buckle; PPV pars plana vitrectomy; VH Vitreous hemorrhage; PRP panretinal laser photocoagulation; IVK intravitreal kenalog; VMT vitreomacular traction; MH Macular hole;  NVD neovascularization of the disc; NVE neovascularization elsewhere; AREDS age related eye disease study; ARMD age related macular degeneration; POAG primary open angle glaucoma; EBMD epithelial/anterior basement membrane dystrophy; ACIOL anterior chamber intraocular lens; IOL intraocular lens; PCIOL posterior chamber intraocular lens; Phaco/IOL phacoemulsification with intraocular lens placement; [place] photorefractive keratectomy; LASIK laser assisted in situ keratomileusis; HTN hypertension; DM diabetes mellitus; COPD chronic obstructive pulmonary disease

## 2021-02-20 NOTE — Assessment & Plan Note (Addendum)
Temporal and inferotemporal retinal detachment repaired via vitrectomy fluid gas exchange gas injection endolaser 2009, OD

## 2021-02-20 NOTE — Assessment & Plan Note (Signed)
Flat, stable no interval change for 13 years, no high risk features

## 2021-02-20 NOTE — Assessment & Plan Note (Signed)
As per Dr. Katy Fitch

## 2021-02-23 ENCOUNTER — Non-Acute Institutional Stay (SKILLED_NURSING_FACILITY): Payer: Medicare PPO | Admitting: Nurse Practitioner

## 2021-02-23 DIAGNOSIS — J431 Panlobular emphysema: Secondary | ICD-10-CM | POA: Diagnosis not present

## 2021-02-23 DIAGNOSIS — G2581 Restless legs syndrome: Secondary | ICD-10-CM

## 2021-02-23 DIAGNOSIS — M810 Age-related osteoporosis without current pathological fracture: Secondary | ICD-10-CM

## 2021-02-23 DIAGNOSIS — E782 Mixed hyperlipidemia: Secondary | ICD-10-CM

## 2021-02-23 DIAGNOSIS — F0390 Unspecified dementia without behavioral disturbance: Secondary | ICD-10-CM | POA: Diagnosis not present

## 2021-02-23 DIAGNOSIS — K219 Gastro-esophageal reflux disease without esophagitis: Secondary | ICD-10-CM

## 2021-02-23 DIAGNOSIS — K5901 Slow transit constipation: Secondary | ICD-10-CM

## 2021-02-23 DIAGNOSIS — G40909 Epilepsy, unspecified, not intractable, without status epilepticus: Secondary | ICD-10-CM | POA: Diagnosis not present

## 2021-02-23 DIAGNOSIS — I10 Essential (primary) hypertension: Secondary | ICD-10-CM | POA: Diagnosis not present

## 2021-02-23 DIAGNOSIS — R609 Edema, unspecified: Secondary | ICD-10-CM

## 2021-02-23 DIAGNOSIS — M159 Polyosteoarthritis, unspecified: Secondary | ICD-10-CM | POA: Diagnosis not present

## 2021-02-23 DIAGNOSIS — L089 Local infection of the skin and subcutaneous tissue, unspecified: Secondary | ICD-10-CM

## 2021-02-26 ENCOUNTER — Encounter: Payer: Self-pay | Admitting: Nurse Practitioner

## 2021-02-26 NOTE — Progress Notes (Signed)
Location:   [facility] Room Number: 40 A Place of Service:  SNF (31) Provider:  Shailah Gibbins X, NP  Mahlon Gammon, MD  Patient Care Team: Mahlon Gammon, MD as PCP - General (Internal Medicine) Ernesto Rutherford, MD as Consulting Physician (Ophthalmology) Jake Bathe, MD as Consulting Physician (Cardiology) Venancio Poisson, MD as Consulting Physician (Dermatology) Ranee Gosselin, MD as Consulting Physician (Orthopedic Surgery) Eugenia Mcalpine, MD as Consulting Physician (Orthopedic Surgery) Bradly Bienenstock, MD as Consulting Physician (Orthopedic Surgery) [name] Kelley Knoth X, NP as Nurse Practitioner (Nurse Practitioner) York Spaniel, MD as Consulting Physician (Neurology)  Extended Emergency Contact Information Primary Emergency Contact: [name],[name] Address: [address]          [address]          [place], [place] [address] [place] Home Phone: [phone] Relation: Sister Secondary Emergency Contact: Rocky [place] Home Phone: [phone] Mobile Phone: [phone] Relation: Sister  Code Status:  DNR Goals of care: Advanced Directive information Advanced Directives 02/23/2021  Does Patient Have a Medical Advance Directive? Yes  Type of Advance Directive Out of facility DNR (pink MOST or yellow form)  Does patient want to make changes to medical advance directive? No - Patient declined  Copy of Healthcare Power of Attorney in Chart? -  Pre-existing out of facility DNR order (yellow form or pink MOST form) Yellow form placed in chart (order not valid for inpatient use);Pink MOST form placed in chart (order not valid for inpatient use)     Chief Complaint  Patient presents with   Acute Visit    Acute visit for Swelling of surgical scar    HPI:  Pt is a 87 y.o. female seen today for an acute visit for a pustular skin change at the previous surgical scar on the left knee.     COVID  infection, s/p Paxlovid, recovered.             A closed nondisplaced transverse fracture left patella 04/26/20, f/u Ortho s/p  ORIF.  prn Oxycodone for pain. Walking with walker.              GERD ST eval significant reflux as results of mucus production, oropharyngeal dysphagia. Takes Pantoprazole, Hgb 14.2 06/08/20, improved.              Restless leg symptom, takes MiraPex              Hx of dementia,  on Memantine              Hx of seizures, stable, on Keppra, Lamictal             Edema, trace, on Furosemide,  Bun/creat 17/0.99 eGFR 60 08/03/20             OA in general, on Tylenol, Oxycodone             HTN, takes Amlodipine, ASA, Bun/creat 17/1.0 eGFR 52 08/04/20             COPD, takes Spiriva qd, prn ProAir             Constipation, takes Senokot, MiraLax.              OP, stopped Foxamax per her request. takes Vit D.              Hyperlipidemia, takes Simvastatin, LDL 88 11/23/20  Past Medical History:  Diagnosis Date  Acute bronchitis 05/23/2011   Acute upper respiratory infections of unspecified site 05/23/2011   Arthritis    Chronic airway obstruction, not elsewhere classified 05/23/2011   Disturbance of salivary secretion 01/31/2011   Dizziness and giddiness 01/31/2011   Dyspnea    Essential tremor 04/25/2014   External hemorrhoids without mention of complication 66/29/4765   Gait disorder 04/25/2014   Insomnia, unspecified 09/12/2011   Lumbago 01/31/2011   Major depressive disorder, single episode, unspecified 01/31/2011   Memory disorder 04/25/2014   Mitral valve disorders(424.0) 01/31/2011   Other and unspecified hyperlipidemia 01/31/2011   Other convulsions 01/31/2011   Other emphysema ([place]) 01/31/2011   Pain in joint, site unspecified 01/31/2011   Restless legs syndrome (RLS) 09/12/2011   Retinal detachment with retinal defect of right eye 2011   right eye twice   Seizure disorder ([facility])    Senile osteoporosis 01/31/2011   Spontaneous ecchymoses 01/31/2011   Stiffness of  joints, not elsewhere classified, multiple sites 01/31/2011   Unspecified essential hypertension 01/31/2011   Past Surgical History:  Procedure Laterality Date   ABDOMINAL HYSTERECTOMY  06/21/2003   TAH/BSO, omenectomy PSB resect, Stg IC cystadenofibroma   CHOLECYSTECTOMY  2005   Dr. Marlou Starks   ELBOW SURGERY Right 2008   broken   Dr. Apolonio Schneiders   EYE SURGERY     ORIF PATELLA Left 05/02/2020   Procedure: OPEN REDUCTION INTERNAL (ORIF) FIXATION LEFT PATELLA WITH MEDIAL AND LATERAL LIGAMENT REINFORCEMENTS;  Surgeon: [name], MD;  Location: [facility];  Service: Orthopedics;  Laterality: Left;   ORIF PATELLA Left 05/30/2020   Procedure: OPEN REDUCTION INTERNAL (ORIF) FIXATION PATELLA;  Surgeon: [name], MD;  Location: [facility];  Service: Orthopedics;  Laterality: Left;   RETINAL DETACHMENT SURGERY N/A    two   REVERSE SHOULDER ARTHROPLASTY Left 05/06/2019   Procedure: REVERSE SHOULDER ARTHROPLASTY;  Surgeon: Justice Britain, MD;  Location: [facility];  Service: Orthopedics;  Laterality: Left;  164min   ROTATOR CUFF REPAIR Right 2012   Dr. Theda Sers   SQUAMOUS CELL CARCINOMA EXCISION Bilateral 2012, 8/14   [place] on legs   Dr. Sarajane Jews   TONSILLECTOMY  1941   VIDEO BRONCHOSCOPY WITH ENDOBRONCHIAL NAVIGATION N/A 11/29/2015   Procedure: VIDEO BRONCHOSCOPY WITH ENDOBRONCHIAL NAVIGATION;  Surgeon: Collene Gobble, MD;  Location: [facility];  Service: Thoracic;  Laterality: N/A;    Allergies  Allergen Reactions   Dyazide [Hydrochlorothiazide W-Triamterene]     Other reaction(s): her blood pressure too much   Latex Other (See Comments)    Swelling   Other     Other reaction(s): local red reaction    Rofecoxib     Other reaction(s): shortness of breath   Sulfa Antibiotics Nausea And Vomiting   Tetracycline Hcl     Other reaction(s): Cant take due to a drug interation    Allergies as of 02/23/2021       Reactions   Dyazide [hydrochlorothiazide W-triamterene]    Other reaction(s): her blood  pressure too much   Latex Other (See Comments)   Swelling   Other    Other reaction(s): local red reaction   Rofecoxib    Other reaction(s): shortness of breath   Sulfa Antibiotics Nausea And Vomiting   Tetracycline Hcl    Other reaction(s): Cant take due to a drug interation        Medication List        Accurate as of February 23, 2021 11:59 PM. If you have any questions, ask your nurse  or doctor.          acetaminophen 500 MG tablet Commonly known as: TYLENOL Take 2 tablets (1,000 mg total) by mouth every 6 (six) hours as needed.   amLODipine 2.5 MG tablet Commonly known as: NORVASC Take 2.5 mg by mouth daily.   aspirin 81 MG chewable tablet Chew 81 mg by mouth daily.   CeraVe Crea Apply 1 application topically daily. Mix cream with Triamcinolone   chlorhexidine 0.12 % solution Commonly known as: PERIDEX Use as directed 15 mLs in the mouth or throat at bedtime.   cholecalciferol 1000 units tablet Commonly known as: VITAMIN D Take 1 tablet (1,000 Units total) by mouth daily.   furosemide 20 MG tablet Commonly known as: LASIX Take 20 mg by mouth daily.   hydrocortisone 2.5 % lotion Apply 1 application topically daily as needed (psoriasis).   hydrocortisone cream 1 % Apply 1 application topically 2 (two) times daily as needed for itching.   ketoconazole 2 % shampoo Commonly known as: NIZORAL Apply 1 application topically daily.   ketoconazole 2 % cream Commonly known as: NIZORAL Apply 1 application topically as needed (for psoriasis).   lamoTRIgine 150 MG tablet Commonly known as: LAMICTAL Take 150 mg by mouth 2 (two) times daily.   levETIRAcetam 500 MG tablet Commonly known as: KEPPRA Take 500 mg by mouth at bedtime.   levETIRAcetam 250 MG tablet Commonly known as: KEPPRA Take 250 mg by mouth daily. In the morning   memantine 10 MG tablet Commonly known as: NAMENDA TAKE 1 TABLET BY MOUTH TWICE DAILY.   nystatin powder Commonly known  as: MYCOSTATIN/NYSTOP Apply 1 application topically 2 (two) times daily as needed (tinea cruris).   oxyCODONE 5 MG immediate release tablet Commonly known as: Oxy IR/ROXICODONE Take 1 tablet (5 mg total) by mouth every 6 (six) hours as needed for severe pain.   pantoprazole 40 MG tablet Commonly known as: PROTONIX Take 40 mg by mouth daily.   polyethylene glycol 17 g packet Commonly known as: MIRALAX / GLYCOLAX Take 17 g by mouth daily.   Potassium Chloride ER 20 MEQ Tbcr Take 20 mEq by mouth daily.   pramipexole 0.25 MG tablet Commonly known as: MIRAPEX Take 0.5 mg by mouth at bedtime.   ProAir RespiClick 283 (90 Base) MCG/ACT Aepb Generic drug: Albuterol Sulfate Inhale 2 puffs into the lungs as needed.   Robitussin Cough+ Chest Max St 10-200 MG/5ML Liqd Generic drug: Dextromethorphan-guaiFENesin Take 5 mLs by mouth daily as needed.   sennosides-docusate sodium 8.6-50 MG tablet Commonly known as: SENOKOT-S Take 2 tablets by mouth at bedtime.   simvastatin 10 MG tablet Commonly known as: ZOCOR Take 10 mg by mouth at bedtime.   sodium chloride 0.65 % Soln nasal spray Commonly known as: OCEAN Place 1 spray into both nostrils 3 (three) times daily as needed for congestion.   tiotropium 18 MCG inhalation capsule Commonly known as: Spiriva HandiHaler INHALE CONTENTS OF ONE CAPSULE ONCE DAILY FOR COPD.   triamcinolone cream 0.1 % Commonly known as: KENALOG Apply 1 application topically daily. Mix with cerave        Review of Systems  Constitutional:  Negative for fatigue, fever and unexpected weight change.  HENT:  Positive for hearing loss. Negative for trouble swallowing.   Eyes:  Negative for photophobia, pain, discharge, redness, itching and visual disturbance.       Residual left lower eyelid stye.   Respiratory:  Negative for cough.  DOE is chronic  Cardiovascular:  Positive for leg swelling. Negative for chest pain and palpitations.   Gastrointestinal:  Negative for abdominal pain and constipation.       Acid reflux symptoms.   Genitourinary:  Negative for dysuria and urgency.  Musculoskeletal:  Positive for arthralgias, gait problem and joint swelling.       ORIF of the left patella.   Skin:  Negative for color change.       BLE discoloration, mild erythema BLE and left knee. A pustule on the left knee surgical scar   Neurological:  Positive for seizures. Negative for speech difficulty, weakness and headaches.       Memory lapses. Hx of seizures. RLS  Psychiatric/Behavioral:  Negative for behavioral problems and sleep disturbance. The patient is not nervous/anxious.    Immunization History  Administered Date(s) Administered   Influenza Split 01/06/2014, 01/15/2017, 01/08/2018, 12/09/2018   Influenza Whole 01/07/2012, 01/06/2013   Influenza, High Dose Seasonal PF 12/25/2015, 01/20/2017   Influenza,inj,Quad PF,6+ Mos 12/21/2014   Influenza-Unspecified 12/09/2018, 01/18/2020, 01/24/2021   Moderna Sars-Covid-2 Vaccination 04/12/2019, 06/05/2019, 02/15/2020, 09/05/2020   PFIZER(Purple Top)SARS-COV-2 Vaccination 12/26/2020   Pneumococcal Conjugate-13 02/01/2014   Pneumococcal Polysaccharide-23 12/20/1992, 01/15/2000, 04/08/2004, 06/08/2004   Td 04/08/2002, 04/22/2002   Tdap 04/09/2011, 08/17/2015   Zoster Recombinat (Shingrix) 05/29/2005, 08/27/2017   Zoster, Live 04/08/2008, 05/29/2014, 05/28/2017   Zoster, Unspecified 05/29/2005   Pertinent  Health Maintenance Due  Topic Date Due   INFLUENZA VACCINE  Completed   DEXA SCAN  Completed   MAMMOGRAM  Discontinued   Fall Risk 05/12/2019 05/12/2019 05/13/2019 05/13/2019 04/26/2020  Falls in the past year? - - - - -  Patient Fall Risk Level High fall risk High fall risk High fall risk High fall risk Low fall risk   Functional Status Survey:    Vitals:   02/26/21 1606  BP: 128/72  Pulse: 72  Resp: 18  Temp: (!) 97.4 F (36.3 C)  SpO2: 94%  Weight: 161 lb 11.2 oz  (73.3 kg)  Height: $Remove'5\' 2"'sqKrHZT$  (1.575 m)   Body mass index is 29.58 kg/m. Physical Exam Vitals and nursing note reviewed.  Constitutional:      Appearance: Normal appearance.  HENT:     Head: Normocephalic and atraumatic.     Nose: Nose normal.     Mouth/Throat:     Mouth: Mucous membranes are moist.  Eyes:     Extraocular Movements: Extraocular movements intact.     Conjunctiva/sclera:     Right eye: Right conjunctiva is not injected.     Left eye: Left conjunctiva is not injected.     Pupils: Pupils are equal, round, and reactive to light.     Comments: Residual lateral left lower eyelid stye.   Neck:     Comments: Left adam's apple bony aspect sore/discomfort when palpated, no redness, warmth, swelling, or enlarged lymph nodes noted.  Cardiovascular:     Rate and Rhythm: Normal rate and regular rhythm.     Heart sounds: Murmur heard.     Comments: PD pulses are not felt.  Pulmonary:     Effort: Pulmonary effort is normal.     Breath sounds: Rales present.     Comments: Decreased air entry to both lungs. Bibasilar rales.  Abdominal:     General: Bowel sounds are normal.     Palpations: Abdomen is soft.     Tenderness: There is no abdominal tenderness.     Comments: Mid abd surgical scar  Musculoskeletal:  General: No tenderness.     Cervical back: Normal range of motion and neck supple.     Right lower leg: Edema present.     Left lower leg: Edema present.     Comments:  Decreased overhead ROM of the left shoulder. BLE edema trace  is chronic. Left knee s/p ORIF of the patella, mild warmth and swelling comparing to the right knee. BLE discoloration  Skin:    General: Skin is warm and dry.     Comments: Brownish skin discoloration BLE. A pustule medial left knee surgical site, lanced with needle, drained  Neurological:     General: No focal deficit present.     Mental Status: She is alert and oriented to person, place, and time. Mental status is at baseline.     Gait:  Gait abnormal.  Psychiatric:        Mood and Affect: Mood normal.        Behavior: Behavior normal.        Thought Content: Thought content normal.    Labs reviewed: Recent Labs    05/02/20 0630 05/26/20 0935 06/08/20 0000 06/13/20 0000 08/04/20 1205  NA 140 141 143 142 142  K 3.5 3.9 4.1 3.9 4.6  CL 104 104 105 108 104  CO2 25 26 31* 24* 27*  GLUCOSE 99 86  --   --   --   BUN 25* 27* 24* 22* 17  CREATININE 0.97 0.94 1.0 0.9 1.0  CALCIUM 9.2 9.5 9.4 9.6 10.0   Recent Labs    06/08/20 0000  AST 16  ALT 9  ALKPHOS 138*  ALBUMIN 3.9   Recent Labs    05/02/20 0630 05/26/20 0935 06/08/20 0000  WBC 6.3 5.7 5.5  NEUTROABS  --   --  3,515.00  HGB 13.6 14.7 14.2  HCT 42.6 46.5* 43  MCV 89.7 91.7  --   PLT 223 253 247   Lab Results  Component Value Date   TSH 1.79 06/29/2019   No results found for: HGBA1C Lab Results  Component Value Date   CHOL 169 11/23/2020   HDL 64 11/23/2020   LDLCALC 88 11/23/2020   TRIG 80 11/23/2020   CHOLHDL 3.6 06/27/2015    Significant Diagnostic Results in last 30 days:  OCT, Retina - OU - Both Eyes  Result Date: 02/20/2021 Right Eye Quality was good. Scan locations included subfoveal. Central Foveal Thickness: 285. Progression has been stable. Findings include normal foveal contour. Left Eye Quality was good. Scan locations included subfoveal. Central Foveal Thickness: 269. Progression has been stable. Findings include normal foveal contour. Notes No active maculopathy OU  Color Fundus Photography Optos - OU - Both Eyes  Result Date: 02/20/2021 Right Eye Progression has been stable. Disc findings include normal observations. Macula : normal observations. Vessels : normal observations. Left Eye Progression has been stable. Disc findings include normal observations. Macula : normal observations. Vessels : normal observations. Periphery : normal observations. 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  Superotemporal portion of the macula and outside the macula, flat choroidal nevus no high risk features OD No interval change   Assessment/Plan Senile osteoporosis stopped Foxamax per her request. takes Vit D.   Hyperlipidemia takes Simvastatin, LDL 88 11/23/20  Slow transit constipation Stable, takes Senokot, MiraLax.   COPD (chronic obstructive pulmonary disease) (HCC) Stable, akes Spiriva qd, prn ProAir  Essential hypertension Blood pressure is controlled, takes Spiriva qd, prn ProAir  Generalized osteoarthritis of  multiple sites in general, on Tylenol, Oxycodone. A closed nondisplaced transverse fracture left patella 04/26/20, f/u Ortho s/p  ORIF.  prn Oxycodone for pain. Walking with walker.   Edema  trace, on Furosemide,  Bun/creat 17/0.99 eGFR 60 08/03/20  Seizure disorder (HCC)  stable, on Keppra, Lamictal  Senile dementia (HCC) High functional,  on Memantine   Restless legs syndrome (RLS) Stable, akes MiraPex   GERD (gastroesophageal reflux disease) ST eval significant reflux as results of mucus production, oropharyngeal dysphagia. Takes Pantoprazole, Hgb 14.2 06/08/20, improved.  Pustule A pustule medial left knee surgical site, lanced with needle, drained, apply ABT ointment, cover with non adhesive dressing until healed.     Family/ staff Communication: plan of care reviewed with the patient and charge nurse.   Labs/tests ordered:   none  Time spend 25 minutes.

## 2021-02-27 ENCOUNTER — Encounter: Payer: Self-pay | Admitting: Nurse Practitioner

## 2021-02-27 NOTE — Assessment & Plan Note (Signed)
takes Simvastatin, LDL 88 11/23/20

## 2021-02-27 NOTE — Assessment & Plan Note (Signed)
Stable, takes Senokot, MiraLax.

## 2021-02-27 NOTE — Assessment & Plan Note (Signed)
ST eval significant reflux as results of mucus production, oropharyngeal dysphagia. Takes Pantoprazole, Hgb 14.2 06/08/20, improved.

## 2021-02-27 NOTE — Assessment & Plan Note (Signed)
trace, on Furosemide,  Bun/creat 17/0.99 eGFR 60 08/03/20 

## 2021-02-27 NOTE — Assessment & Plan Note (Signed)
stable, on Keppra, Lamictal 

## 2021-02-27 NOTE — Assessment & Plan Note (Signed)
High functional,  on Memantine

## 2021-02-27 NOTE — Assessment & Plan Note (Signed)
stopped Foxamax per her request. takes Vit D.  

## 2021-02-27 NOTE — Assessment & Plan Note (Signed)
Stable, akes Spiriva qd, prn ProAir

## 2021-02-27 NOTE — Assessment & Plan Note (Signed)
Stable, akes MiraPex

## 2021-02-27 NOTE — Assessment & Plan Note (Signed)
in general, on Tylenol, Oxycodone. A closed nondisplaced transverse fracture left patella 04/26/20, f/u Ortho s/p  ORIF.  prn Oxycodone for pain. Walking with walker.

## 2021-02-27 NOTE — Assessment & Plan Note (Signed)
Blood pressure is controlled, takes Spiriva qd, prn ProAir

## 2021-02-27 NOTE — Assessment & Plan Note (Signed)
A pustule medial left knee surgical site, lanced with needle, drained, apply ABT ointment, cover with non adhesive dressing until healed.

## 2021-03-06 ENCOUNTER — Non-Acute Institutional Stay (SKILLED_NURSING_FACILITY): Payer: Medicare PPO | Admitting: Internal Medicine

## 2021-03-06 ENCOUNTER — Encounter: Payer: Self-pay | Admitting: Internal Medicine

## 2021-03-06 DIAGNOSIS — R590 Localized enlarged lymph nodes: Secondary | ICD-10-CM

## 2021-03-06 DIAGNOSIS — B349 Viral infection, unspecified: Secondary | ICD-10-CM

## 2021-03-06 DIAGNOSIS — J410 Simple chronic bronchitis: Secondary | ICD-10-CM | POA: Diagnosis not present

## 2021-03-06 NOTE — Progress Notes (Signed)
Location:   [facility] Room Number: 29 Place of Service:  SNF [phone]) Provider:  Veleta Miners MD  [name], MD  Patient Care Team: [name], MD as PCP - General (Internal Medicine) [name], MD as Consulting Physician (Ophthalmology) [name], MD as Consulting Physician (Cardiology) [name], MD as Consulting Physician (Dermatology) [name], MD as Consulting Physician (Orthopedic Surgery) [name], MD as Consulting Physician (Orthopedic Surgery) [name], MD as Consulting Physician (Orthopedic Surgery) B[place]e, Gramling Mast, Man X, NP as Nurse Practitioner (Nurse Practitioner) [name], MD as Consulting Physician (Neurology)  Extended Emergency Contact Information Primary Emergency Contact: [name],[name] Address: [address]          [place], [place] [address] [name] of [place] Phone: [phone] Relation: Sister Secondary Emergency Contact: [name] [place] of [place] Phone: [phone] Mobile Phone: [phone] Relation: Sister  Code Status:  DNR Managed Care Goals of care: Advanced Directive information Advanced Directives 03/06/2021  Does Patient Have a Medical Advance Directive? Yes  Type of Advance Directive Out of facility DNR (pink MOST or yellow form)  Does patient want to make changes to medical advance directive? No - Patient declined  Copy of [place] in Chart? -  Pre-existing out of facility DNR order (yellow form or pink MOST form) Yellow form placed in chart (order not valid for inpatient use);Pink MOST form placed in chart (order not valid for inpatient use)     Chief Complaint  Patient presents with   Acute Visit    Swollen Lymph nodes     HPI:  Pt is a 87 y.o. female seen today for an acute visit for Swollen Lymph nodes in her Anterior neck  Patient also has h/o COPD, Hypertension,Seizure  Disorder,Mild Cognitive impairment, Hyperlipidemia Chronic Venous Changes in LE  Left Humerus Fracture with Displacement 1/22 Sustained Left Patellar Fracture due to Mechanical fall Underwent ORIF on 1/25   Few days ago she felt sore throat. And then felt swelling in her neck It was on both side but now only in left side [place] Sore throat is resolved No Fever or cough or any other pain  Past Medical History:  Diagnosis Date   Acute bronchitis 05/23/2011   Acute upper respiratory infections of unspecified site 05/23/2011   Arthritis    Chronic airway obstruction, not elsewhere classified 05/23/2011   Disturbance of salivary secretion 01/31/2011   Dizziness and giddiness 01/31/2011   Dyspnea    Essential tremor 04/25/2014   External hemorrhoids without mention of complication 88/41/6606   Gait disorder 04/25/2014   Insomnia, unspecified 09/12/2011   Lumbago 01/31/2011   Major depressive disorder, single episode, unspecified 01/31/2011   Memory disorder 04/25/2014   Mitral valve disorders(424.0) 01/31/2011   Other and unspecified hyperlipidemia 01/31/2011   Other convulsions 01/31/2011   Other emphysema ([place]) 01/31/2011   Pain in joint, site unspecified 01/31/2011   Restless legs syndrome (RLS) 09/12/2011   Retinal detachment with retinal defect of right eye 2011   right eye twice   Seizure disorder ([place])    Senile osteoporosis 01/31/2011   Spontaneous ecchymoses 01/31/2011   Stiffness of joints, not elsewhere classified, multiple sites 01/31/2011   Unspecified essential hypertension 01/31/2011   Past Surgical History:  Procedure Laterality Date   ABDOMINAL HYSTERECTOMY  06/21/2003   TAH/BSO, omenectomy PSB resect, Stg IC cystadenofibroma  CHOLECYSTECTOMY  2005   Dr. Marlou Starks   ELBOW SURGERY Right 2008   broken   Dr. Apolonio Schneiders   EYE SURGERY     ORIF PATELLA Left 05/02/2020   Procedure: OPEN REDUCTION INTERNAL (ORIF) FIXATION LEFT PATELLA WITH MEDIAL AND LATERAL LIGAMENT  REINFORCEMENTS;  Surgeon: [name], MD;  Location: [facility];  Service: Orthopedics;  Laterality: Left;   ORIF PATELLA Left 05/30/2020   Procedure: OPEN REDUCTION INTERNAL (ORIF) FIXATION PATELLA;  Surgeon: [name], MD;  Location: [facility];  Service: Orthopedics;  Laterality: Left;   RETINAL DETACHMENT SURGERY N/A    two   REVERSE SHOULDER ARTHROPLASTY Left 05/06/2019   Procedure: REVERSE SHOULDER ARTHROPLASTY;  Surgeon: Justice Britain, MD;  Location: [facility];  Service: Orthopedics;  Laterality: Left;  134min   ROTATOR CUFF REPAIR Right 2012   Dr. Theda Sers   SQUAMOUS CELL CARCINOMA EXCISION Bilateral 2012, 8/14   [place] on legs   Dr. Sarajane Jews   TONSILLECTOMY  1941   VIDEO BRONCHOSCOPY WITH ENDOBRONCHIAL NAVIGATION N/A 11/29/2015   Procedure: VIDEO BRONCHOSCOPY WITH ENDOBRONCHIAL NAVIGATION;  Surgeon: Collene Gobble, MD;  Location: [facility];  Service: Thoracic;  Laterality: N/A;    Allergies  Allergen Reactions   Dyazide [Hydrochlorothiazide W-Triamterene]     Other reaction(s): her blood pressure too much   Latex Other (See Comments)    Swelling   Other     Other reaction(s): local red reaction    Rofecoxib     Other reaction(s): shortness of breath   Sulfa Antibiotics Nausea And Vomiting   Tetracycline Hcl     Other reaction(s): Cant take due to a drug interation    Allergies as of 03/06/2021       Reactions   Dyazide [hydrochlorothiazide W-triamterene]    Other reaction(s): her blood pressure too much   Latex Other (See Comments)   Swelling   Other    Other reaction(s): local red reaction   Rofecoxib    Other reaction(s): shortness of breath   Sulfa Antibiotics Nausea And Vomiting   Tetracycline Hcl    Other reaction(s): Cant take due to a drug interation        Medication List        Accurate as of March 06, 2021  2:29 PM. If you have any questions, ask your nurse or doctor.          acetaminophen 500 MG tablet Commonly known as: TYLENOL Take 2  tablets (1,000 mg total) by mouth every 6 (six) hours as needed.   amLODipine 2.5 MG tablet Commonly known as: NORVASC Take 2.5 mg by mouth daily.   aspirin 81 MG chewable tablet Chew 81 mg by mouth daily.   CeraVe Crea Apply 1 application topically daily. Mix cream with Triamcinolone   chlorhexidine 0.12 % solution Commonly known as: PERIDEX Use as directed 15 mLs in the mouth or throat at bedtime.   cholecalciferol 1000 units tablet Commonly known as: VITAMIN D Take 1 tablet (1,000 Units total) by mouth daily.   furosemide 20 MG tablet Commonly known as: LASIX Take 20 mg by mouth daily.   hydrocortisone 2.5 % lotion Apply 1 application topically daily as needed (psoriasis).   hydrocortisone cream 1 % Apply 1 application topically 2 (two) times daily as needed for itching.   ketoconazole 2 % shampoo Commonly known as: NIZORAL Apply 1 application topically daily.   ketoconazole 2 % cream Commonly known as: NIZORAL Apply 1 application topically as needed (for psoriasis).  lamoTRIgine 150 MG tablet Commonly known as: LAMICTAL Take 150 mg by mouth 2 (two) times daily.   levETIRAcetam 500 MG tablet Commonly known as: KEPPRA Take 500 mg by mouth at bedtime.   levETIRAcetam 250 MG tablet Commonly known as: KEPPRA Take 250 mg by mouth daily. In the morning   memantine 10 MG tablet Commonly known as: NAMENDA TAKE 1 TABLET BY MOUTH TWICE DAILY.   nystatin powder Commonly known as: MYCOSTATIN/NYSTOP Apply 1 application topically 2 (two) times daily as needed (tinea cruris).   oxyCODONE 5 MG immediate release tablet Commonly known as: Oxy IR/ROXICODONE Take 1 tablet (5 mg total) by mouth every 6 (six) hours as needed for severe pain.   pantoprazole 40 MG tablet Commonly known as: PROTONIX Take 40 mg by mouth daily.   polyethylene glycol 17 g packet Commonly known as: MIRALAX / GLYCOLAX Take 17 g by mouth daily.   Potassium Chloride ER 20 MEQ Tbcr Take 20  mEq by mouth daily.   pramipexole 0.25 MG tablet Commonly known as: MIRAPEX Take 0.5 mg by mouth at bedtime.   ProAir RespiClick 433 (90 Base) MCG/ACT Aepb Generic drug: Albuterol Sulfate Inhale 2 puffs into the lungs as needed.   Robitussin Cough+ Chest Max St 10-200 MG/5ML Liqd Generic drug: Dextromethorphan-guaiFENesin Take 5 mLs by mouth daily as needed.   sennosides-docusate sodium 8.6-50 MG tablet Commonly known as: SENOKOT-S Take 2 tablets by mouth at bedtime.   simvastatin 10 MG tablet Commonly known as: ZOCOR Take 10 mg by mouth at bedtime.   sodium chloride 0.65 % Soln nasal spray Commonly known as: OCEAN Place 1 spray into both nostrils 3 (three) times daily as needed for congestion.   tiotropium 18 MCG inhalation capsule Commonly known as: Spiriva HandiHaler INHALE CONTENTS OF ONE CAPSULE ONCE DAILY FOR COPD.   triamcinolone cream 0.1 % Commonly known as: KENALOG Apply 1 application topically daily. Mix with cerave        Review of Systems  Constitutional:  Negative for activity change and appetite change.  HENT:  Positive for sore throat.   Respiratory:  Negative for cough and shortness of breath.   Cardiovascular:  Positive for leg swelling.  Gastrointestinal:  Negative for constipation.  Genitourinary: Negative.   Musculoskeletal:  Negative for arthralgias, gait problem and myalgias.  Skin: Negative.   Neurological:  Negative for dizziness and weakness.  Psychiatric/Behavioral:  Negative for confusion, dysphoric mood and sleep disturbance.    Immunization History  Administered Date(s) Administered   Influenza Split 01/06/2014, 01/15/2017, 01/08/2018, 12/09/2018   Influenza Whole 01/07/2012, 01/06/2013   Influenza, High Dose Seasonal PF 12/25/2015, 01/20/2017   Influenza,inj,Quad PF,6+ Mos 12/21/2014   Influenza-Unspecified 12/09/2018, 01/18/2020, 01/24/2021   Moderna Sars-Covid-2 Vaccination 04/12/2019, 06/05/2019, 02/15/2020, 09/05/2020    PFIZER(Purple Top)SARS-COV-2 Vaccination 12/26/2020   Pneumococcal Conjugate-13 02/01/2014   Pneumococcal Polysaccharide-23 12/20/1992, 01/15/2000, 04/08/2004, 06/08/2004   Td 04/08/2002, 04/22/2002   Tdap 04/09/2011, 08/17/2015   Zoster Recombinat (Shingrix) 05/29/2005, 08/27/2017   Zoster, Live 04/08/2008, 05/29/2014, 05/28/2017   Zoster, Unspecified 05/29/2005   Pertinent  Health Maintenance Due  Topic Date Due   INFLUENZA VACCINE  Completed   DEXA SCAN  Completed   MAMMOGRAM  Discontinued   Fall Risk 05/12/2019 05/12/2019 05/13/2019 05/13/2019 04/26/2020  Falls in the past year? - - - - -  Patient Fall Risk Level High fall risk High fall risk High fall risk High fall risk Low fall risk   Functional Status Survey:    Vitals:  03/06/21 1235  BP: (!) 147/83  Pulse: 94  Resp: 18  Temp: (!) 97.1 F (36.2 C)  SpO2: 94%  Weight: 160 lb 12.8 oz (72.9 kg)  Height: 5\' 2"  (1.575 m)   Body mass index is 29.41 kg/m. Physical Exam Vitals reviewed.  Constitutional:      Appearance: Normal appearance.  HENT:     Head: Normocephalic.     Nose: Nose normal.     Mouth/Throat:     Mouth: Mucous membranes are moist.     Pharynx: Oropharynx is clear.  Eyes:     Pupils: Pupils are equal, round, and reactive to light.  Neck:     Comments: A small Cervical Lymph node felt in anterior neck area was not tender . Not fixed Cardiovascular:     Rate and Rhythm: Normal rate and regular rhythm.     Pulses: Normal pulses.     Heart sounds: Normal heart sounds. No murmur heard. Pulmonary:     Effort: Pulmonary effort is normal.     Breath sounds: Normal breath sounds.  Abdominal:     General: Abdomen is flat. Bowel sounds are normal.     Palpations: Abdomen is soft.  Musculoskeletal:        General: Swelling present.     Cervical back: Neck supple.  Skin:    General: Skin is warm.  Neurological:     General: No focal deficit present.     Mental Status: She is alert and oriented to person,  place, and time.  Psychiatric:        Mood and Affect: Mood normal.        Thought Content: Thought content normal.    Labs reviewed: Recent Labs    05/02/20 0630 05/26/20 0935 06/08/20 0000 06/13/20 0000 08/04/20 1205  NA 140 141 143 142 142  K 3.5 3.9 4.1 3.9 4.6  CL 104 104 105 108 104  CO2 25 26 31* 24* 27*  GLUCOSE 99 86  --   --   --   BUN 25* 27* 24* 22* 17  CREATININE 0.97 0.94 1.0 0.9 1.0  CALCIUM 9.2 9.5 9.4 9.6 10.0   Recent Labs    06/08/20 0000  AST 16  ALT 9  ALKPHOS 138*  ALBUMIN 3.9   Recent Labs    05/02/20 0630 05/26/20 0935 06/08/20 0000  WBC 6.3 5.7 5.5  NEUTROABS  --   --  3,515.00  HGB 13.6 14.7 14.2  HCT 42.6 46.5* 43  MCV 89.7 91.7  --   PLT 223 253 247   Lab Results  Component Value Date   TSH 1.79 06/29/2019   No results found for: HGBA1C Lab Results  Component Value Date   CHOL 169 11/23/2020   HDL 64 11/23/2020   LDLCALC 88 11/23/2020   TRIG 80 11/23/2020   CHOLHDL 3.6 06/27/2015    Significant Diagnostic Results in last 30 days:  OCT, Retina - OU - Both Eyes  Result Date: 02/20/2021 Right Eye Quality was good. Scan locations included subfoveal. Central Foveal Thickness: 285. Progression has been stable. Findings include normal foveal contour. Left Eye Quality was good. Scan locations included subfoveal. Central Foveal Thickness: 269. Progression has been stable. Findings include normal foveal contour. Notes No active maculopathy OU  Color Fundus Photography Optos - OU - Both Eyes  Result Date: 02/20/2021 Right Eye Progression has been stable. Disc findings include normal observations. Macula : normal observations. Vessels : normal observations. Left Eye Progression has  been stable. Disc findings include normal observations. Macula : normal observations. Vessels : normal observations. Periphery : normal observations. 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  Superotemporal portion of  the macula and outside the macula, flat choroidal nevus no high risk features OD No interval change   Assessment/Plan  Localized enlarged lymph node Most Likely Inflammatory Reeval next week. If any change will Need more imaging Viral illness Symptoms resolved now Simple chronic bronchitis (HCC) On Pro air prn  Other issues  Seizure disorder ([facility]CC) On Lamictal and Keppra Edema, unspecified type Low dose of Lasix Essential hypertension On Norvasc Mixed hyperlipidemia Continue Statin Restless legs syndrome (RLS) On MIrapex Senile osteoporosis Was on Fosmax took herself of it and does not want to take anything anymore   Gastroesophageal reflux disease, unspecified whether esophagitis present Continue Protonix Generalized osteoarthritis of multiple sites On Oxycodone Dementia On Namenda Very High Functional Family/ staff Communication:   Labs/tests ordered:

## 2021-04-06 ENCOUNTER — Encounter: Payer: Self-pay | Admitting: Nurse Practitioner

## 2021-04-06 ENCOUNTER — Non-Acute Institutional Stay (SKILLED_NURSING_FACILITY): Payer: Medicare PPO | Admitting: Nurse Practitioner

## 2021-04-06 DIAGNOSIS — M810 Age-related osteoporosis without current pathological fracture: Secondary | ICD-10-CM

## 2021-04-06 DIAGNOSIS — R21 Rash and other nonspecific skin eruption: Secondary | ICD-10-CM | POA: Diagnosis not present

## 2021-04-06 DIAGNOSIS — I10 Essential (primary) hypertension: Secondary | ICD-10-CM | POA: Diagnosis not present

## 2021-04-06 DIAGNOSIS — K219 Gastro-esophageal reflux disease without esophagitis: Secondary | ICD-10-CM

## 2021-04-06 DIAGNOSIS — G2581 Restless legs syndrome: Secondary | ICD-10-CM

## 2021-04-06 DIAGNOSIS — M159 Polyosteoarthritis, unspecified: Secondary | ICD-10-CM

## 2021-04-06 DIAGNOSIS — G40909 Epilepsy, unspecified, not intractable, without status epilepticus: Secondary | ICD-10-CM

## 2021-04-06 DIAGNOSIS — R609 Edema, unspecified: Secondary | ICD-10-CM

## 2021-04-06 DIAGNOSIS — K5901 Slow transit constipation: Secondary | ICD-10-CM

## 2021-04-06 DIAGNOSIS — F0390 Unspecified dementia without behavioral disturbance: Secondary | ICD-10-CM | POA: Diagnosis not present

## 2021-04-06 DIAGNOSIS — J431 Panlobular emphysema: Secondary | ICD-10-CM | POA: Diagnosis not present

## 2021-04-06 DIAGNOSIS — E782 Mixed hyperlipidemia: Secondary | ICD-10-CM

## 2021-04-06 NOTE — Assessment & Plan Note (Signed)
takes Simvastatin, LDL 88 11/23/20

## 2021-04-06 NOTE — Assessment & Plan Note (Signed)
A closed nondisplaced transverse fracture left patella 04/26/20, f/u Ortho s/p  ORIF.  prn Oxycodone for pain. Walking with walker. slightly warmer the left knee is chronic

## 2021-04-06 NOTE — Assessment & Plan Note (Signed)
stable, on Keppra, Lamictal 

## 2021-04-06 NOTE — Progress Notes (Signed)
Nursing Home Room Number: 19 Location:   [facility] Place of Service:  SNF (31)SNF [facility] Provider: [facility] [name] NP  [name], MD  Patient Care Team: [name], MD as PCP - General (Internal Medicine) [name], MD as Consulting Physician (Ophthalmology) [name], MD as Consulting Physician (Cardiology) [name], MD as Consulting Physician (Dermatology) [name], MD as Consulting Physician (Orthopedic Surgery) [name], MD as Consulting Physician (Orthopedic Surgery) [name], MD as Consulting Physician (Orthopedic Surgery) [place], [name], NP as Nurse Practitioner (Nurse Practitioner) [name], MD as Consulting Physician (Neurology)  Extended Emergency Contact Information Primary Emergency Contact: [name],[name] Address: [address]          [place], [place] [address] [name] of [place] Phone: [phone] Relation: Sister Secondary Emergency Contact: [name] [place] of [place] Phone: [phone] Mobile Phone: [phone] Relation: Sister  Code Status:  DNR Goals of care: Advanced Directive information Advanced Directives 04/06/2021  Does Patient Have a Medical Advance Directive? Yes  Type of Advance Directive Out of facility DNR (pink MOST or yellow form)  Does patient want to make changes to medical advance directive? No - Patient declined  Copy of [place] in Chart? -  Pre-existing out of facility DNR order (yellow form or pink MOST form) Pink MOST form placed in chart (order not valid for inpatient use);Yellow form placed in chart (order not valid for inpatient use)     Chief Complaint  Patient presents with   Medical Management of Chronic Issues    Routine follow up visit   Health Maintenance    4th COVID booster    HPI:  Pt is a 87 y.o. female seen today for medical management of chronic diseases.       COVID  infection, s/p Paxlovid, recovered.             A closed nondisplaced transverse fracture left patella 04/26/20, f/u Ortho s/p  ORIF.  prn Oxycodone for pain. Walking with walker. slightly warmer the left knee is chronic.              GERD ST eval significant reflux as results of mucus production, oropharyngeal dysphagia. Takes Pantoprazole, Hgb 14.2 06/08/20, improved.              Restless leg symptom, takes MiraPex              Hx of dementia, high functional in SNF [facility], on Memantine              Hx of seizures, stable, on Keppra, Lamictal             Edema, trace, on Furosemide,  Bun/creat 17/0.99 eGFR 60 08/03/20             OA in general, on Tylenol, Oxycodone             HTN, takes Amlodipine, ASA, Bun/creat 17/1.0 eGFR 52 08/04/20             COPD, takes Spiriva qd, prn ProAir             Constipation, takes Senokot, MiraLax.              OP, stopped Foxamax per her request. takes Vit D.              Hyperlipidemia, takes Simvastatin, LDL 88 11/23/20  Past Medical History:  Diagnosis Date   Acute bronchitis 05/23/2011   Acute upper respiratory infections of unspecified site 05/23/2011   Arthritis    Chronic airway obstruction, not elsewhere classified 05/23/2011   Disturbance of salivary secretion 01/31/2011   Dizziness and giddiness 01/31/2011   Dyspnea    Essential tremor 04/25/2014   External hemorrhoids without mention of complication 70/04/7492   Gait disorder 04/25/2014   Insomnia, unspecified 09/12/2011   Lumbago 01/31/2011   Major depressive disorder, single episode, unspecified 01/31/2011   Memory disorder 04/25/2014   Mitral valve disorders(424.0) 01/31/2011   Other and unspecified hyperlipidemia 01/31/2011   Other convulsions 01/31/2011   Other emphysema ([place]) 01/31/2011   Pain in joint, site unspecified 01/31/2011   Restless legs syndrome (RLS) 09/12/2011   Retinal detachment with retinal defect of right eye 2011   right eye twice   Seizure disorder ([place])    Senile  osteoporosis 01/31/2011   Spontaneous ecchymoses 01/31/2011   Stiffness of joints, not elsewhere classified, multiple sites 01/31/2011   Unspecified essential hypertension 01/31/2011   Past Surgical History:  Procedure Laterality Date   ABDOMINAL HYSTERECTOMY  06/21/2003   TAH/BSO, omenectomy PSB resect, Stg IC cystadenofibroma   CHOLECYSTECTOMY  2005   Dr. Marlou Starks   ELBOW SURGERY Right 2008   broken   Dr. Apolonio Schneiders   EYE SURGERY     ORIF PATELLA Left 05/02/2020   Procedure: OPEN REDUCTION INTERNAL (ORIF) FIXATION LEFT PATELLA WITH MEDIAL AND LATERAL LIGAMENT REINFORCEMENTS;  Surgeon: [name], MD;  Location: [facility];  Service: Orthopedics;  Laterality: Left;   ORIF PATELLA Left 05/30/2020   Procedure: OPEN REDUCTION INTERNAL (ORIF) FIXATION PATELLA;  Surgeon: [name], MD;  Location: [facility];  Service: Orthopedics;  Laterality: Left;   RETINAL DETACHMENT SURGERY N/A    two   REVERSE SHOULDER ARTHROPLASTY Left 05/06/2019   Procedure: REVERSE SHOULDER ARTHROPLASTY;  Surgeon: Justice Britain, MD;  Location: [facility];  Service: Orthopedics;  Laterality: Left;  19min   ROTATOR CUFF REPAIR Right 2012   Dr. Theda Sers   SQUAMOUS CELL CARCINOMA EXCISION Bilateral 2012, 8/14   [place] on legs   Dr. Sarajane Jews   TONSILLECTOMY  1941   VIDEO BRONCHOSCOPY WITH ENDOBRONCHIAL NAVIGATION N/A 11/29/2015   Procedure: VIDEO BRONCHOSCOPY WITH ENDOBRONCHIAL NAVIGATION;  Surgeon: Collene Gobble, MD;  Location: [facility];  Service: Thoracic;  Laterality: N/A;    Allergies  Allergen Reactions   Dyazide [Hydrochlorothiazide W-Triamterene]     Other reaction(s): her blood pressure too much   Latex Other (See Comments)    Swelling   Other     Other reaction(s): local red reaction    Rofecoxib     Other reaction(s): shortness of breath   Sulfa Antibiotics Nausea And Vomiting   Tetracycline Hcl     Other reaction(s): Cant take due to a drug interation    Allergies as of 04/06/2021       Reactions    Dyazide [hydrochlorothiazide W-triamterene]    Other reaction(s): her blood pressure too much   Latex Other (See Comments)   Swelling   Other    Other reaction(s): local red reaction   Rofecoxib    Other reaction(s): shortness of breath   Sulfa Antibiotics Nausea And Vomiting   Tetracycline Hcl    Other reaction(s): Cant take due to a drug interation        Medication List        Accurate as of April 06, 2021 11:59 PM.  If you have any questions, ask your nurse or doctor.          acetaminophen 500 MG tablet Commonly known as: TYLENOL Take 2 tablets (1,000 mg total) by mouth every 6 (six) hours as needed.   amLODipine 2.5 MG tablet Commonly known as: NORVASC Take 2.5 mg by mouth daily.   aspirin 81 MG chewable tablet Chew 81 mg by mouth daily.   CeraVe Crea Apply 1 application topically daily. Mix cream with Triamcinolone   chlorhexidine 0.12 % solution Commonly known as: PERIDEX Use as directed 15 mLs in the mouth or throat at bedtime.   cholecalciferol 1000 units tablet Commonly known as: VITAMIN D Take 1 tablet (1,000 Units total) by mouth daily.   furosemide 20 MG tablet Commonly known as: LASIX Take 20 mg by mouth daily.   hydrocortisone 2.5 % lotion Apply 1 application topically daily as needed (psoriasis).   hydrocortisone cream 1 % Apply 1 application topically 2 (two) times daily as needed for itching.   ketoconazole 2 % shampoo Commonly known as: NIZORAL Apply 1 application topically daily.   ketoconazole 2 % cream Commonly known as: NIZORAL Apply 1 application topically as needed (for psoriasis).   lamoTRIgine 150 MG tablet Commonly known as: LAMICTAL Take 150 mg by mouth 2 (two) times daily.   levETIRAcetam 500 MG tablet Commonly known as: KEPPRA Take 500 mg by mouth at bedtime.   levETIRAcetam 250 MG tablet Commonly known as: KEPPRA Take 250 mg by mouth daily. In the morning   memantine 10 MG tablet Commonly known as:  NAMENDA TAKE 1 TABLET BY MOUTH TWICE DAILY.   nystatin powder Commonly known as: MYCOSTATIN/NYSTOP Apply 1 application topically 2 (two) times daily as needed (tinea cruris).   oxyCODONE 5 MG immediate release tablet Commonly known as: Oxy IR/ROXICODONE Take 1 tablet (5 mg total) by mouth every 6 (six) hours as needed for severe pain.   pantoprazole 40 MG tablet Commonly known as: PROTONIX Take 40 mg by mouth daily.   polyethylene glycol 17 g packet Commonly known as: MIRALAX / GLYCOLAX Take 17 g by mouth daily.   Potassium Chloride ER 20 MEQ Tbcr Take 20 mEq by mouth daily.   pramipexole 0.25 MG tablet Commonly known as: MIRAPEX Take 0.5 mg by mouth at bedtime.   ProAir RespiClick 161 (90 Base) MCG/ACT Aepb Generic drug: Albuterol Sulfate Inhale 2 puffs into the lungs as needed.   Robitussin Cough+ Chest Max St 10-200 MG/5ML Liqd Generic drug: Dextromethorphan-guaiFENesin Take 5 mLs by mouth daily as needed.   sennosides-docusate sodium 8.6-50 MG tablet Commonly known as: SENOKOT-S Take 2 tablets by mouth at bedtime.   simvastatin 10 MG tablet Commonly known as: ZOCOR Take 10 mg by mouth at bedtime.   sodium chloride 0.65 % Soln nasal spray Commonly known as: OCEAN Place 1 spray into both nostrils 3 (three) times daily as needed for congestion.   tiotropium 18 MCG inhalation capsule Commonly known as: Spiriva HandiHaler INHALE CONTENTS OF ONE CAPSULE ONCE DAILY FOR COPD.   triamcinolone cream 0.1 % Commonly known as: KENALOG Apply 1 application topically daily. Mix with cerave        Review of Systems  Constitutional:  Negative for activity change, appetite change and fever.  HENT:  Positive for hearing loss. Negative for trouble swallowing.   Eyes:  Negative for visual disturbance.  Respiratory:  Negative for cough.        DOE is chronic  Cardiovascular:  Positive for leg  swelling.  Gastrointestinal:  Negative for abdominal pain and constipation.        Acid reflux symptoms.   Genitourinary:  Negative for dysuria and urgency.  Musculoskeletal:  Positive for arthralgias, gait problem and joint swelling.       ORIF of the left patella.   Skin:  Negative for color change.       BLE discoloration, mild erythema left knee.   Neurological:  Positive for seizures. Negative for speech difficulty, weakness and headaches.       Memory lapses. Hx of seizures. RLS  Psychiatric/Behavioral:  Negative for behavioral problems and sleep disturbance. The patient is not nervous/anxious.    Immunization History  Administered Date(s) Administered   Influenza Split 01/06/2014, 01/15/2017, 01/08/2018, 12/09/2018   Influenza Whole 01/07/2012, 01/06/2013   Influenza, High Dose Seasonal PF 12/25/2015, 01/20/2017   Influenza,inj,Quad PF,6+ Mos 12/21/2014   Influenza-Unspecified 12/09/2018, 01/18/2020, 01/24/2021   Moderna Sars-Covid-2 Vaccination 04/12/2019, 06/05/2019, 02/15/2020, 09/05/2020   PFIZER(Purple Top)SARS-COV-2 Vaccination 12/26/2020   Pneumococcal Conjugate-13 02/01/2014   Pneumococcal Polysaccharide-23 12/20/1992, 01/15/2000, 04/08/2004, 06/08/2004   Td 04/08/2002, 04/22/2002   Tdap 04/09/2011, 08/17/2015   Zoster Recombinat (Shingrix) 05/29/2005, 08/27/2017   Zoster, Live 04/08/2008, 05/29/2014, 05/28/2017   Zoster, Unspecified 05/29/2005   Pertinent  Health Maintenance Due  Topic Date Due   INFLUENZA VACCINE  Completed   DEXA SCAN  Completed   MAMMOGRAM  Discontinued   Fall Risk 05/12/2019 05/12/2019 05/13/2019 05/13/2019 04/26/2020  Falls in the past year? - - - - -  Patient Fall Risk Level High fall risk High fall risk High fall risk High fall risk Low fall risk   Functional Status Survey:    Vitals:   04/06/21 1545  BP: 110/76  Pulse: 88  Resp: 17  Temp: (!) 97.5 F (36.4 C)  SpO2: 94%  Weight: 163 lb 14.4 oz (74.3 kg)  Height: $Remove'5\' 2"'[id number]$  (1.575 m)   Body mass index is 29.98 kg/m. Physical Exam Vitals and nursing note reviewed.   Constitutional:      Appearance: Normal appearance.  HENT:     Head: Normocephalic and atraumatic.     Nose: Nose normal.     Mouth/Throat:     Mouth: Mucous membranes are moist.  Eyes:     Extraocular Movements: Extraocular movements intact.     Conjunctiva/sclera:     Right eye: Right conjunctiva is not injected.     Left eye: Left conjunctiva is not injected.     Pupils: Pupils are equal, round, and reactive to light.     Comments: Residual lateral left lower eyelid stye.   Neck:     Comments: Left adam's apple bony aspect soreness on palpation is chronic, no noted lymph nodes. Cardiovascular:     Rate and Rhythm: Normal rate and regular rhythm.     Heart sounds: Murmur heard.     Comments: PD pulses are not felt.  Pulmonary:     Effort: Pulmonary effort is normal.     Breath sounds: Rales present.     Comments: Decreased air entry to both lungs. Bibasilar rales.  Abdominal:     General: Bowel sounds are normal.     Palpations: Abdomen is soft.     Tenderness: There is no abdominal tenderness.     Comments: Mid abd surgical scar  Musculoskeletal:        General: No tenderness.     Cervical back: Normal range of motion and neck supple.     Right lower  leg: Edema present.     Left lower leg: Edema present.     Comments:  Decreased overhead ROM of the left shoulder. BLE edema trace  is chronic. Left knee s/p ORIF of the patella, mild warmth has no change.   Skin:    General: Skin is warm and dry.     Findings: Rash present.     Comments: Brownish skin discoloration BLE. Irritated redness noted in the right groin skin folds.   Neurological:     General: No focal deficit present.     Mental Status: She is alert and oriented to person, place, and time. Mental status is at baseline.     Gait: Gait abnormal.  Psychiatric:        Mood and Affect: Mood normal.        Behavior: Behavior normal.        Thought Content: Thought content normal.    Labs reviewed: Recent Labs     05/02/20 0630 05/26/20 0935 06/08/20 0000 06/13/20 0000 08/04/20 1205  NA 140 141 143 142 142  K 3.5 3.9 4.1 3.9 4.6  CL 104 104 105 108 104  CO2 25 26 31* 24* 27*  GLUCOSE 99 86  --   --   --   BUN 25* 27* 24* 22* 17  CREATININE 0.97 0.94 1.0 0.9 1.0  CALCIUM 9.2 9.5 9.4 9.6 10.0   Recent Labs    06/08/20 0000  AST 16  ALT 9  ALKPHOS 138*  ALBUMIN 3.9   Recent Labs    05/02/20 0630 05/26/20 0935 06/08/20 0000  WBC 6.3 5.7 5.5  NEUTROABS  --   --  3,515.00  HGB 13.6 14.7 14.2  HCT 42.6 46.5* 43  MCV 89.7 91.7  --   PLT 223 253 247   Lab Results  Component Value Date   TSH 1.79 06/29/2019   No results found for: HGBA1C Lab Results  Component Value Date   CHOL 169 11/23/2020   HDL 64 11/23/2020   LDLCALC 88 11/23/2020   TRIG 80 11/23/2020   CHOLHDL 3.6 06/27/2015    Significant Diagnostic Results in last 30 days:  No results found.  Assessment/Plan  Rash, skin Right groin redness, moist and heat are contributory, apply Nystatin powder bid until healed, 0.5% Triamcinolone cream daily x 10 days. Observe.   Generalized osteoarthritis of multiple sites   A closed nondisplaced transverse fracture left patella 04/26/20, f/u Ortho s/p  ORIF.  prn Oxycodone for pain. Walking with walker. slightly warmer the left knee is chronic  GERD (gastroesophageal reflux disease) ST eval significant reflux as results of mucus production, oropharyngeal dysphagia. Takes Pantoprazole, Hgb 14.2 06/08/20, improved.   Restless legs syndrome (RLS) Stable, continue MiraPex.   Senile dementia ([place]) high functional in SN[facility], on Memantine   Seizure disorder ([facility]) stable, on Keppra, Lamictal  Edema  trace, on Furosemide,  Bun/creat 17/0.99 eGFR 60 08/03/20  Essential hypertension Blood pressure is controlled,  takes Amlodipine, ASA, Bun/creat 17/1.0 eGFR 52 08/04/20  COPD (chronic obstructive pulmonary disease) (HCC) Stable, takes Spiriva qd, prn ProAir  Slow transit  constipation Stable, takes Senokot, MiraLax.   Senile osteoporosis stopped Foxamax per her request. takes Vit D.   Hyperlipidemia  takes Simvastatin, LDL 88 11/23/20   Family/ staff Communication: plan of care reviewed with the patient and charge nurse.   Labs/tests ordered:  none  Time spend 35 minutes.

## 2021-04-06 NOTE — Assessment & Plan Note (Signed)
Stable, takes Spiriva qd, prn ProAir 

## 2021-04-06 NOTE — Assessment & Plan Note (Signed)
Stable, continue MiraPex.

## 2021-04-06 NOTE — Assessment & Plan Note (Addendum)
ST eval significant reflux as results of mucus production, oropharyngeal dysphagia. Takes Pantoprazole, Hgb 14.2 06/08/20, improved.

## 2021-04-06 NOTE — Assessment & Plan Note (Signed)
stopped Foxamax per her request. takes Vit D.  

## 2021-04-06 NOTE — Assessment & Plan Note (Signed)
Blood pressure is controlled,  takes Amlodipine, ASA, Bun/creat 17/1.0 eGFR 52 08/04/20

## 2021-04-06 NOTE — Assessment & Plan Note (Signed)
Right groin redness, moist and heat are contributory, apply Nystatin powder bid until healed, 0.5% Triamcinolone cream daily x 10 days. Observe.

## 2021-04-06 NOTE — Assessment & Plan Note (Signed)
trace, on Furosemide,  Bun/creat 17/0.99 eGFR 60 08/03/20 

## 2021-04-06 NOTE — Assessment & Plan Note (Signed)
high functional in SN[facility], on Memantine  

## 2021-04-06 NOTE — Assessment & Plan Note (Signed)
Stable, takes Senokot, MiraLax.

## 2021-04-10 ENCOUNTER — Encounter: Payer: Self-pay | Admitting: Nurse Practitioner

## 2021-04-17 DIAGNOSIS — L602 Onychogryphosis: Secondary | ICD-10-CM | POA: Diagnosis not present

## 2021-04-17 DIAGNOSIS — M79672 Pain in left foot: Secondary | ICD-10-CM | POA: Diagnosis not present

## 2021-04-17 DIAGNOSIS — L84 Corns and callosities: Secondary | ICD-10-CM | POA: Diagnosis not present

## 2021-04-17 DIAGNOSIS — M79671 Pain in right foot: Secondary | ICD-10-CM | POA: Diagnosis not present

## 2021-05-07 ENCOUNTER — Encounter: Payer: Self-pay | Admitting: Nurse Practitioner

## 2021-05-07 ENCOUNTER — Non-Acute Institutional Stay (SKILLED_NURSING_FACILITY): Payer: Medicare PPO | Admitting: Nurse Practitioner

## 2021-05-07 DIAGNOSIS — I10 Essential (primary) hypertension: Secondary | ICD-10-CM | POA: Diagnosis not present

## 2021-05-07 DIAGNOSIS — K219 Gastro-esophageal reflux disease without esophagitis: Secondary | ICD-10-CM | POA: Diagnosis not present

## 2021-05-07 DIAGNOSIS — E042 Nontoxic multinodular goiter: Secondary | ICD-10-CM | POA: Diagnosis not present

## 2021-05-07 DIAGNOSIS — E782 Mixed hyperlipidemia: Secondary | ICD-10-CM

## 2021-05-07 DIAGNOSIS — K5901 Slow transit constipation: Secondary | ICD-10-CM

## 2021-05-07 DIAGNOSIS — R609 Edema, unspecified: Secondary | ICD-10-CM | POA: Diagnosis not present

## 2021-05-07 DIAGNOSIS — J431 Panlobular emphysema: Secondary | ICD-10-CM

## 2021-05-07 DIAGNOSIS — G40909 Epilepsy, unspecified, not intractable, without status epilepticus: Secondary | ICD-10-CM

## 2021-05-07 DIAGNOSIS — M159 Polyosteoarthritis, unspecified: Secondary | ICD-10-CM | POA: Diagnosis not present

## 2021-05-07 DIAGNOSIS — F0390 Unspecified dementia without behavioral disturbance: Secondary | ICD-10-CM | POA: Diagnosis not present

## 2021-05-07 DIAGNOSIS — M810 Age-related osteoporosis without current pathological fracture: Secondary | ICD-10-CM

## 2021-05-07 DIAGNOSIS — G2581 Restless legs syndrome: Secondary | ICD-10-CM

## 2021-05-07 DIAGNOSIS — R946 Abnormal results of thyroid function studies: Secondary | ICD-10-CM | POA: Diagnosis not present

## 2021-05-07 NOTE — Assessment & Plan Note (Signed)
in general, on Tylenol, Oxycodone. A closed nondisplaced transverse fracture left patella 04/26/20, f/u Ortho s/p  ORIF.  prn Oxycodone for pain. Walking with walker. slightly warmer the left knee is chronic.

## 2021-05-07 NOTE — Assessment & Plan Note (Signed)
high functional in SN[facility], on Memantine  

## 2021-05-07 NOTE — Assessment & Plan Note (Signed)
takes Simvastatin, LDL 88 11/23/20

## 2021-05-07 NOTE — Assessment & Plan Note (Signed)
stable, on Keppra, Lamictal 

## 2021-05-07 NOTE — Assessment & Plan Note (Signed)
stopped Foxamax per her request. takes Vit D.  

## 2021-05-07 NOTE — Assessment & Plan Note (Signed)
trace, on Furosemide,  Bun/creat 17/0.99 eGFR 60 08/03/20 

## 2021-05-07 NOTE — Progress Notes (Signed)
Location:   [name] Room Number: 29A Place of Service:  SNF (31) Provider:  Milad Bublitz, NP    Patient Care Team: Mahlon Gammon, MD as PCP - General (Internal Medicine) Ernesto Rutherford, MD as Consulting Physician (Ophthalmology) Jake Bathe, MD as Consulting Physician (Cardiology) Venancio Poisson, MD as Consulting Physician (Dermatology) Ranee Gosselin, MD as Consulting Physician (Orthopedic Surgery) Eugenia Mcalpine, MD as Consulting Physician (Orthopedic Surgery) Bradly Bienenstock, MD as Consulting Physician (Orthopedic Surgery) [name] Pradeep Beaubrun X, NP as Nurse Practitioner (Nurse Practitioner) York Spaniel, MD (Inactive) as Consulting Physician (Neurology)  Extended Emergency Contact Information Primary Emergency Contact: [name],[name] Address: [address]          [address]          [place], [place] [address] [place] Home Phone: [phone] Relation: Sister Secondary Emergency Contact: Rocky [place] Home Phone: [phone] Mobile Phone: [phone] Relation: Sister  Code Status:  DNR Goals of care: Advanced Directive information Advanced Directives 05/08/2021  Does Patient Have a Medical Advance Directive? Yes  Type of Advance Directive Out of facility DNR (pink MOST or yellow form)  Does patient want to make changes to medical advance directive? No - Patient declined  Copy of Healthcare Power of Attorney in Chart? -  Pre-existing out of facility DNR order (yellow form or pink MOST form) Yellow form placed in chart (order not valid for inpatient use)     Chief Complaint  Patient presents with   Medical Management of Chronic Issues    Routine Visit   Immunizations    Discuss the need for Covid booster    HPI:  Pt is a 88 y.o. female seen today for medical management of chronic diseases.      COVID infection, s/p Paxlovid, recovered.             A closed nondisplaced  transverse fracture left patella 04/26/20, f/u Ortho s/p  ORIF.  prn Oxycodone for pain. Walking with walker. slightly warmer the left knee is chronic.              GERD ST eval significant reflux as results of mucus production, oropharyngeal dysphagia. Takes Pantoprazole, Hgb 14.2 06/08/20, improved.              Restless leg symptom, takes MiraPex              Hx of dementia, high functional in SNF [facility], on Memantine              Hx of seizures, stable, on Keppra, Lamictal             Edema, trace, on Furosemide,  Bun/creat 17/0.99 eGFR 60 08/03/20             OA in general, on Tylenol, Oxycodone             HTN, takes Amlodipine, ASA, Bun/creat 17/1.0 eGFR 52 08/04/20             COPD, takes Spiriva qd, prn ProAir             Constipation, takes Senokot, MiraLax.              OP, stopped Foxamax per her request. takes Vit D.              Hyperlipidemia, takes Simvastatin, LDL 88 11/23/20  Past Medical History:  Diagnosis Date   Acute bronchitis 05/23/2011  Acute upper respiratory infections of unspecified site 05/23/2011   Arthritis    Chronic airway obstruction, not elsewhere classified 05/23/2011   Disturbance of salivary secretion 01/31/2011   Dizziness and giddiness 01/31/2011   Dyspnea    Essential tremor 04/25/2014   External hemorrhoids without mention of complication 41/63/8453   Gait disorder 04/25/2014   Insomnia, unspecified 09/12/2011   Lumbago 01/31/2011   Major depressive disorder, single episode, unspecified 01/31/2011   Memory disorder 04/25/2014   Mitral valve disorders(424.0) 01/31/2011   Other and unspecified hyperlipidemia 01/31/2011   Other convulsions 01/31/2011   Other emphysema ([place]) 01/31/2011   Pain in joint, site unspecified 01/31/2011   Restless legs syndrome (RLS) 09/12/2011   Retinal detachment with retinal defect of right eye 2011   right eye twice   Seizure disorder ([place])    Senile osteoporosis 01/31/2011   Spontaneous ecchymoses 01/31/2011   Stiffness of  joints, not elsewhere classified, multiple sites 01/31/2011   Unspecified essential hypertension 01/31/2011   Past Surgical History:  Procedure Laterality Date   ABDOMINAL HYSTERECTOMY  06/21/2003   TAH/BSO, omenectomy PSB resect, Stg IC cystadenofibroma   CHOLECYSTECTOMY  2005   Dr. Marlou Starks   ELBOW SURGERY Right 2008   broken   Dr. Apolonio Schneiders   EYE SURGERY     ORIF PATELLA Left 05/02/2020   Procedure: OPEN REDUCTION INTERNAL (ORIF) FIXATION LEFT PATELLA WITH MEDIAL AND LATERAL LIGAMENT REINFORCEMENTS;  Surgeon: [name], MD;  Location: [facility];  Service: Orthopedics;  Laterality: Left;   ORIF PATELLA Left 05/30/2020   Procedure: OPEN REDUCTION INTERNAL (ORIF) FIXATION PATELLA;  Surgeon: [name], MD;  Location: [facility];  Service: Orthopedics;  Laterality: Left;   RETINAL DETACHMENT SURGERY N/A    two   REVERSE SHOULDER ARTHROPLASTY Left 05/06/2019   Procedure: REVERSE SHOULDER ARTHROPLASTY;  Surgeon: Justice Britain, MD;  Location: [facility];  Service: Orthopedics;  Laterality: Left;  171min   ROTATOR CUFF REPAIR Right 2012   Dr. Theda Sers   SQUAMOUS CELL CARCINOMA EXCISION Bilateral 2012, 8/14   [place] on legs   Dr. Sarajane Jews   TONSILLECTOMY  1941   VIDEO BRONCHOSCOPY WITH ENDOBRONCHIAL NAVIGATION N/A 11/29/2015   Procedure: VIDEO BRONCHOSCOPY WITH ENDOBRONCHIAL NAVIGATION;  Surgeon: Collene Gobble, MD;  Location: [facility];  Service: Thoracic;  Laterality: N/A;    Allergies  Allergen Reactions   Dyazide [Hydrochlorothiazide W-Triamterene]     Other reaction(s): her blood pressure too much   Latex Other (See Comments)    Swelling   Other     Other reaction(s): local red reaction    Rofecoxib     Other reaction(s): shortness of breath   Sulfa Antibiotics Nausea And Vomiting   Tetracycline Hcl     Other reaction(s): Cant take due to a drug interation    Allergies as of 05/07/2021       Reactions   Dyazide [hydrochlorothiazide W-triamterene]    Other reaction(s): her blood  pressure too much   Latex Other (See Comments)   Swelling   Other    Other reaction(s): local red reaction   Rofecoxib    Other reaction(s): shortness of breath   Sulfa Antibiotics Nausea And Vomiting   Tetracycline Hcl    Other reaction(s): Cant take due to a drug interation        Medication List        Accurate as of May 07, 2021 11:59 PM. If you have any questions, ask your nurse or doctor.  STOP taking these medications    sodium chloride 0.65 % Soln nasal spray Commonly known as: OCEAN Stopped by: Daymien Goth X Gloriana Piltz, NP       TAKE these medications    acetaminophen 500 MG tablet Commonly known as: TYLENOL Take 500 mg by mouth every 6 (six) hours as needed.   amLODipine 2.5 MG tablet Commonly known as: NORVASC Take 2.5 mg by mouth daily.   aspirin 81 MG chewable tablet Chew 81 mg by mouth daily.   CeraVe Crea Apply 1 application topically daily. Mix cream with Triamcinolone   chlorhexidine 0.12 % solution Commonly known as: PERIDEX Use as directed 15 mLs in the mouth or throat at bedtime.   cholecalciferol 1000 units tablet Commonly known as: VITAMIN D Take 1 tablet (1,000 Units total) by mouth daily.   furosemide 20 MG tablet Commonly known as: LASIX Take 20 mg by mouth daily.   hydrocortisone 2.5 % lotion Apply 1 application topically daily as needed (psoriasis).   hydrocortisone cream 1 % Apply 1 application topically 2 (two) times daily as needed for itching.   ketoconazole 2 % shampoo Commonly known as: NIZORAL Apply 1 application topically daily.   ketoconazole 2 % cream Commonly known as: NIZORAL Apply 1 application topically as needed (for psoriasis).   lamoTRIgine 150 MG tablet Commonly known as: LAMICTAL Take 150 mg by mouth 2 (two) times daily.   levETIRAcetam 500 MG tablet Commonly known as: KEPPRA Take 500 mg by mouth at bedtime.   levETIRAcetam 250 MG tablet Commonly known as: KEPPRA Take 250 mg by mouth  daily. In the morning   memantine 10 MG tablet Commonly known as: NAMENDA TAKE 1 TABLET BY MOUTH TWICE DAILY.   nystatin powder Commonly known as: MYCOSTATIN/NYSTOP Apply 1 application topically 2 (two) times daily as needed (tinea cruris).   oxyCODONE 5 MG immediate release tablet Commonly known as: Oxy IR/ROXICODONE Take 1 tablet (5 mg total) by mouth every 6 (six) hours as needed for severe pain.   pantoprazole 40 MG tablet Commonly known as: PROTONIX Take 40 mg by mouth daily.   polyethylene glycol 17 g packet Commonly known as: MIRALAX / GLYCOLAX Take 17 g by mouth daily.   Potassium Chloride ER 20 MEQ Tbcr Take 20 mEq by mouth daily.   pramipexole 0.25 MG tablet Commonly known as: MIRAPEX Take 0.5 mg by mouth at bedtime.   ProAir RespiClick 017 (90 Base) MCG/ACT Aepb Generic drug: Albuterol Sulfate Inhale 2 puffs into the lungs as needed.   Robitussin Cough+ Chest Max St 10-200 MG/5ML Liqd Generic drug: Dextromethorphan-guaiFENesin Take 5 mLs by mouth daily as needed.   sennosides-docusate sodium 8.6-50 MG tablet Commonly known as: SENOKOT-S Take 2 tablets by mouth at bedtime.   simvastatin 10 MG tablet Commonly known as: ZOCOR Take 10 mg by mouth at bedtime.   tiotropium 18 MCG inhalation capsule Commonly known as: Spiriva HandiHaler INHALE CONTENTS OF ONE CAPSULE ONCE DAILY FOR COPD.   triamcinolone cream 0.1 % Commonly known as: KENALOG Apply 1 application topically daily. Mix with cerave   triamcinolone ointment 0.5 % Commonly known as: KENALOG Apply 1 application topically daily at 12 noon.        Review of Systems  Constitutional:  Negative for activity change and fever.  HENT:  Positive for hearing loss. Negative for trouble swallowing.   Eyes:  Negative for visual disturbance.  Respiratory:  Negative for cough.        DOE is chronic  Cardiovascular:  Positive for leg swelling.  Gastrointestinal:  Negative for abdominal pain and  constipation.       Acid reflux symptoms.   Genitourinary:  Negative for dysuria and urgency.  Musculoskeletal:  Positive for arthralgias, gait problem and joint swelling.       ORIF of the left patella.   Skin:  Negative for color change.       BLE discoloration, mild erythema left knee.   Neurological:  Positive for seizures. Negative for speech difficulty, weakness and headaches.       Memory lapses. Hx of seizures. RLS  Psychiatric/Behavioral:  Negative for behavioral problems and sleep disturbance. The patient is not nervous/anxious.    Immunization History  Administered Date(s) Administered   Influenza Split 01/06/2014, 01/15/2017, 01/08/2018, 12/09/2018   Influenza Whole 01/07/2012, 01/06/2013   Influenza, High Dose Seasonal PF 12/25/2015, 01/20/2017   Influenza,inj,Quad PF,6+ Mos 12/21/2014   Influenza-Unspecified 12/09/2018, 01/18/2020, 01/24/2021   Moderna Sars-Covid-2 Vaccination 04/12/2019, 06/05/2019, 02/15/2020, 09/05/2020   PFIZER(Purple Top)SARS-COV-2 Vaccination 12/26/2020   Pneumococcal Conjugate-13 02/01/2014   Pneumococcal Polysaccharide-23 12/20/1992, 01/15/2000, 04/08/2004, 06/08/2004   Td 04/08/2002, 04/22/2002   Tdap 04/09/2011, 08/17/2015   Zoster Recombinat (Shingrix) 05/29/2005, 08/27/2017   Zoster, Live 04/08/2008, 05/29/2014, 05/28/2017   Zoster, Unspecified 05/29/2005   Pertinent  Health Maintenance Due  Topic Date Due   INFLUENZA VACCINE  Completed   DEXA SCAN  Completed   MAMMOGRAM  Discontinued   Fall Risk 05/12/2019 05/12/2019 05/13/2019 05/13/2019 04/26/2020  Falls in the past year? - - - - -  Patient Fall Risk Level High fall risk High fall risk High fall risk High fall risk Low fall risk   Functional Status Survey:    Vitals:   05/07/21 0906  BP: 99/67  Pulse: (!) 101  Resp: 20  Temp: 98.5 F (36.9 C)  SpO2: 95%  Weight: 162 lb (73.5 kg)  Height: $Remove'5\' 2"'jDPBYGy$  (1.575 m)   Body mass index is 29.63 kg/m. Physical Exam Vitals and nursing note  reviewed.  Constitutional:      Appearance: Normal appearance.  HENT:     Head: Normocephalic and atraumatic.     Nose: Nose normal.     Mouth/Throat:     Mouth: Mucous membranes are moist.  Eyes:     Extraocular Movements: Extraocular movements intact.     Conjunctiva/sclera:     Right eye: Right conjunctiva is not injected.     Left eye: Left conjunctiva is not injected.     Pupils: Pupils are equal, round, and reactive to light.     Comments: Residual lateral left lower eyelid stye.  Neck:     Comments: Left adam's apple bony aspect soreness on palpation is chronic, no noted lymph nodes. Cardiovascular:     Rate and Rhythm: Normal rate and regular rhythm.     Heart sounds: Murmur heard.     Comments: PD pulses are not felt.  Pulmonary:     Effort: Pulmonary effort is normal.     Breath sounds: Rales present.     Comments: Decreased air entry to both lungs. Bibasilar rales.  Abdominal:     General: Bowel sounds are normal.     Palpations: Abdomen is soft.     Tenderness: There is no abdominal tenderness.     Comments: Mid abd surgical scar  Musculoskeletal:        General: No tenderness.     Cervical back: Normal range of motion and neck supple.     Right lower  leg: Edema present.     Left lower leg: Edema present.     Comments:  Decreased overhead ROM of the left shoulder. BLE edema trace  is chronic. Left knee s/p ORIF of the patella, mild warmth has no change.   Skin:    General: Skin is warm and dry.     Comments: Brownish skin discoloration BLE.   Neurological:     General: No focal deficit present.     Mental Status: She is alert and oriented to person, place, and time. Mental status is at baseline.     Gait: Gait abnormal.  Psychiatric:        Mood and Affect: Mood normal.        Behavior: Behavior normal.        Thought Content: Thought content normal.    Labs reviewed: Recent Labs    05/26/20 0935 06/08/20 0000 06/13/20 0000 08/04/20 1205  NA 141 143  142 142  K 3.9 4.1 3.9 4.6  CL 104 105 108 104  CO2 26 31* 24* 27*  GLUCOSE 86  --   --   --   BUN 27* 24* 22* 17  CREATININE 0.94 1.0 0.9 1.0  CALCIUM 9.5 9.4 9.6 10.0   Recent Labs    06/08/20 0000  AST 16  ALT 9  ALKPHOS 138*  ALBUMIN 3.9   Recent Labs    05/26/20 0935 06/08/20 0000  WBC 5.7 5.5  NEUTROABS  --  3,515.00  HGB 14.7 14.2  HCT 46.5* 43  MCV 91.7  --   PLT 253 247   Lab Results  Component Value Date   TSH 1.79 06/29/2019   No results found for: HGBA1C Lab Results  Component Value Date   CHOL 169 11/23/2020   HDL 64 11/23/2020   LDLCALC 88 11/23/2020   TRIG 80 11/23/2020   CHOLHDL 3.6 06/27/2015    Significant Diagnostic Results in last 30 days:  No results found.  Assessment/Plan Restless legs syndrome (RLS) Stable, continue MiraPex.   Senile dementia ([place])  high functional in SNF [facility], on Memantine   Seizure disorder ([facility]) stable, on Keppra, Lamictal  Edema  trace, on Furosemide,  Bun/creat 17/0.99 eGFR 60 08/03/20  Generalized osteoarthritis of multiple sites in general, on Tylenol, Oxycodone. A closed nondisplaced transverse fracture left patella 04/26/20, f/u Ortho s/p  ORIF.  prn Oxycodone for pain. Walking with walker. slightly warmer the left knee is chronic.   GERD (gastroesophageal reflux disease) ST eval significant reflux as results of mucus production, oropharyngeal dysphagia. Takes Pantoprazole, Hgb 14.2 06/08/20, improved.   Essential hypertension Continue  Amlodipine, ASA, Bun/creat 17/1.0 eGFR 52 08/04/20  COPD (chronic obstructive pulmonary disease) (HCC) Stable,  takes Spiriva qd, prn ProAir  Slow transit constipation Stable  Senile osteoporosis stopped Foxamax per her request. takes Vit D.   Hyperlipidemia  takes Simvastatin, LDL 88 11/23/20     Family/ staff Communication: plan of care reviewed with the patient and charge nurse.   Labs/tests ordered:  none  Time spend 35 minutes.

## 2021-05-07 NOTE — Assessment & Plan Note (Addendum)
Continue  Amlodipine, ASA, Bun/creat 17/1.0 eGFR 52 08/04/20

## 2021-05-07 NOTE — Assessment & Plan Note (Signed)
Stable, takes Spiriva qd, prn ProAir 

## 2021-05-07 NOTE — Assessment & Plan Note (Signed)
ST eval significant reflux as results of mucus production, oropharyngeal dysphagia. Takes Pantoprazole, Hgb 14.2 06/08/20, improved.

## 2021-05-07 NOTE — Assessment & Plan Note (Signed)
Stable

## 2021-05-07 NOTE — Assessment & Plan Note (Signed)
Stable, continue MiraPex.

## 2021-05-08 ENCOUNTER — Non-Acute Institutional Stay (SKILLED_NURSING_FACILITY): Payer: Medicare PPO | Admitting: Internal Medicine

## 2021-05-08 ENCOUNTER — Encounter: Payer: Self-pay | Admitting: Internal Medicine

## 2021-05-08 DIAGNOSIS — G40909 Epilepsy, unspecified, not intractable, without status epilepticus: Secondary | ICD-10-CM | POA: Diagnosis not present

## 2021-05-08 DIAGNOSIS — E042 Nontoxic multinodular goiter: Secondary | ICD-10-CM | POA: Diagnosis not present

## 2021-05-08 DIAGNOSIS — E782 Mixed hyperlipidemia: Secondary | ICD-10-CM

## 2021-05-08 DIAGNOSIS — G2581 Restless legs syndrome: Secondary | ICD-10-CM | POA: Diagnosis not present

## 2021-05-08 DIAGNOSIS — R609 Edema, unspecified: Secondary | ICD-10-CM

## 2021-05-08 NOTE — Progress Notes (Signed)
Location: [facility] Room Number: 63 Place of Service:  SNF [phone])  Provider: Veleta Miners MD  Code Status: DNR Managed Care Goals of Care:  Advanced Directives 05/08/2021  Does Patient Have a Medical Advance Directive? Yes  Type of Advance Directive Out of facility DNR (pink MOST or yellow form)  Does patient want to make changes to medical advance directive? No - Patient declined  Copy of [place] in Chart? -  Pre-existing out of facility DNR order (yellow form or pink MOST form) Yellow form placed in chart (order not valid for inpatient use)     Chief Complaint  Patient presents with   Acute Visit    Thyroid Nodule     HPI: Patient is a 88 y.o. female seen today for an acute visit for Thyroid Nodule  Patient has h/o COPD, Hypertension,Seizure Disorder,Mild Cognitive impairment, Hyperlipidemia Chronic Venous Changes in LE  Left Humerus Fracture with Displacement 1/22 Sustained Left Patellar Fracture due to Mechanical fall Underwent ORIF on 1/25  She has been feeling Nodules in her Left side of neck and Right side of neck She has h/o history of thyroid nodules and has seen Dr.Gherge in 2017.  At that time they were discussed and decided that she would not get biopsy or further work-up.  Patient wanted to get a ultrasound of her Neck in the facility as she was feeling swelling in her Neck   US  showed single nodule 1.6 x 1.3 x 1.1 cm in right thyroid lobe is actually smaller than her previous nodule Left lobe enlarged as 2 nodules 1 x 1 to 1 cm and 1.3 x1.2 x 1.2 cm. (Unchanged from her previous nodules I discussed with the patient.  She is not having any acute issues She is not interested in Biopsy or Surgery    Past Medical History:  Diagnosis Date   Acute bronchitis 05/23/2011   Acute upper respiratory infections of unspecified site 05/23/2011   Arthritis    Chronic airway obstruction, not elsewhere classified 05/23/2011    Disturbance of salivary secretion 01/31/2011   Dizziness and giddiness 01/31/2011   Dyspnea    Essential tremor 04/25/2014   External hemorrhoids without mention of complication 00/93/8182   Gait disorder 04/25/2014   Insomnia, unspecified 09/12/2011   Lumbago 01/31/2011   Major depressive disorder, single episode, unspecified 01/31/2011   Memory disorder 04/25/2014   Mitral valve disorders(424.0) 01/31/2011   Other and unspecified hyperlipidemia 01/31/2011   Other convulsions 01/31/2011   Other emphysema ([place]) 01/31/2011   Pain in joint, site unspecified 01/31/2011   Restless legs syndrome (RLS) 09/12/2011   Retinal detachment with retinal defect of right eye 2011   right eye twice   Seizure disorder ([place])    Senile osteoporosis 01/31/2011   Spontaneous ecchymoses 01/31/2011   Stiffness of joints, not elsewhere classified, multiple sites 01/31/2011   Unspecified essential hypertension 01/31/2011    Past Surgical History:  Procedure Laterality Date   ABDOMINAL HYSTERECTOMY  06/21/2003   TAH/BSO, omenectomy PSB resect, Stg IC cystadenofibroma   CHOLECYSTECTOMY  2005   Dr. Marlou Starks   ELBOW SURGERY Right 2008   broken   Dr. Apolonio Schneiders   EYE SURGERY     ORIF PATELLA Left 05/02/2020   Procedure: OPEN REDUCTION INTERNAL (ORIF) FIXATION LEFT PATELLA WITH MEDIAL AND LATERAL LIGAMENT REINFORCEMENTS;  Surgeon: [name], MD;  Location: [facility];  Service: Orthopedics;  Laterality: Left;   ORIF PATELLA Left 05/30/2020  Procedure: OPEN REDUCTION INTERNAL (ORIF) FIXATION PATELLA;  Surgeon: [name], MD;  Location: [facility];  Service: Orthopedics;  Laterality: Left;   RETINAL DETACHMENT SURGERY N/A    two   REVERSE SHOULDER ARTHROPLASTY Left 05/06/2019   Procedure: REVERSE SHOULDER ARTHROPLASTY;  Surgeon: Justice Britain, MD;  Location: [facility];  Service: Orthopedics;  Laterality: Left;  154min   ROTATOR CUFF REPAIR Right 2012   Dr. Theda Sers   SQUAMOUS CELL CARCINOMA EXCISION Bilateral 2012,  8/14   [place] on legs   Dr. Sarajane Jews   TONSILLECTOMY  1941   VIDEO BRONCHOSCOPY WITH ENDOBRONCHIAL NAVIGATION N/A 11/29/2015   Procedure: VIDEO BRONCHOSCOPY WITH ENDOBRONCHIAL NAVIGATION;  Surgeon: Collene Gobble, MD;  Location: [place] [facility];  Service: Thoracic;  Laterality: N/A;    Allergies  Allergen Reactions   Dyazide [Hydrochlorothiazide W-Triamterene]     Other reaction(s): her blood pressure too much   Latex Other (See Comments)    Swelling   Other     Other reaction(s): local red reaction    Rofecoxib     Other reaction(s): shortness of breath   Sulfa Antibiotics Nausea And Vomiting   Tetracycline Hcl     Other reaction(s): Cant take due to a drug interation    Outpatient Encounter Medications as of 05/08/2021  Medication Sig   acetaminophen (TYLENOL) 500 MG tablet Take 500 mg by mouth every 6 (six) hours as needed.   Albuterol Sulfate (PROAIR RESPICLICK) 619 (90 Base) MCG/ACT AEPB Inhale 2 puffs into the lungs as needed.   amLODipine (NORVASC) 2.5 MG tablet Take 2.5 mg by mouth daily.   aspirin 81 MG chewable tablet Chew 81 mg by mouth daily.   chlorhexidine (PERIDEX) 0.12 % solution Use as directed 15 mLs in the mouth or throat at bedtime.   cholecalciferol (VITAMIN D) 1000 units tablet Take 1 tablet (1,000 Units total) by mouth daily.   Dextromethorphan-guaiFENesin (ROBITUSSIN COUGH+ CHEST MAX ST) 10-200 MG/5ML LIQD Take 5 mLs by mouth daily as needed.   Emollient (CERAVE) CREA Apply 1 application topically daily. Mix cream with Triamcinolone   furosemide (LASIX) 20 MG tablet Take 20 mg by mouth daily.   hydrocortisone 2.5 % lotion Apply 1 application topically daily as needed (psoriasis).   hydrocortisone cream 1 % Apply 1 application topically 2 (two) times daily as needed for itching.   ketoconazole (NIZORAL) 2 % cream Apply 1 application topically as needed (for psoriasis).    ketoconazole (NIZORAL) 2 % shampoo Apply 1 application topically daily.   lamoTRIgine (LAMICTAL)  150 MG tablet Take 150 mg by mouth 2 (two) times daily.   levETIRAcetam (KEPPRA) 250 MG tablet Take 250 mg by mouth daily. In the morning   levETIRAcetam (KEPPRA) 500 MG tablet Take 500 mg by mouth at bedtime.   memantine (NAMENDA) 10 MG tablet TAKE 1 TABLET BY MOUTH TWICE DAILY.   nystatin (MYCOSTATIN/NYSTOP) powder Apply 1 application topically 2 (two) times daily as needed (tinea cruris).   oxyCODONE (OXY IR/ROXICODONE) 5 MG immediate release tablet Take 1 tablet (5 mg total) by mouth every 6 (six) hours as needed for severe pain.   pantoprazole (PROTONIX) 40 MG tablet Take 40 mg by mouth daily.   polyethylene glycol (MIRALAX / GLYCOLAX) 17 g packet Take 17 g by mouth daily.   Potassium Chloride ER 20 MEQ TBCR Take 20 mEq by mouth daily.   pramipexole (MIRAPEX) 0.25 MG tablet Take 0.5 mg by mouth at bedtime.   sennosides-docusate sodium (SENOKOT-S) 8.6-50 MG tablet  Take 2 tablets by mouth at bedtime.   simvastatin (ZOCOR) 10 MG tablet Take 10 mg by mouth at bedtime.   tiotropium (SPIRIVA HANDIHALER) 18 MCG inhalation capsule INHALE CONTENTS OF ONE CAPSULE ONCE DAILY FOR COPD.   triamcinolone (KENALOG) 0.1 % Apply 1 application topically daily. Mix with cerave   triamcinolone ointment (KENALOG) 0.5 % Apply 1 application topically daily at 12 noon.   No facility-administered encounter medications on file as of 05/08/2021.    Review of Systems:  Review of Systems  Constitutional:  Negative for activity change and appetite change.  HENT: Negative.    Respiratory:  Negative for cough and shortness of breath.   Cardiovascular:  Positive for leg swelling.  Gastrointestinal:  Negative for constipation.  Genitourinary: Negative.   Musculoskeletal:  Positive for gait problem. Negative for arthralgias and myalgias.  Skin:  Positive for color change.  Neurological:  Negative for dizziness and weakness.  Psychiatric/Behavioral:  Negative for confusion, dysphoric mood and sleep disturbance.     Health Maintenance  Topic Date Due   COVID-19 Vaccine (6 - Booster for Moderna series) 02/20/2021   TETANUS/TDAP  08/16/2025   Pneumonia Vaccine 37+ Years old  Completed   INFLUENZA VACCINE  Completed   DEXA SCAN  Completed   Zoster Vaccines- Shingrix  Completed   HPV VACCINES  Aged Out   MAMMOGRAM  Discontinued    Physical Exam: Vitals:   05/08/21 1441  BP: (!) 118/50  Pulse: 67  Resp: 20  Temp: (!) 96.6 F (35.9 C)  SpO2: 90%  Weight: 162 lb (73.5 kg)  Height: 5\' 2"  (1.575 m)   Body mass index is 29.63 kg/m. Physical Exam Vitals reviewed.  Constitutional:      Appearance: Normal appearance.  HENT:     Head: Normocephalic.     Nose: Nose normal.     Mouth/Throat:     Mouth: Mucous membranes are moist.     Pharynx: Oropharynx is clear.  Eyes:     Pupils: Pupils are equal, round, and reactive to light.  Cardiovascular:     Rate and Rhythm: Normal rate and regular rhythm.     Pulses: Normal pulses.     Heart sounds: Normal heart sounds. No murmur heard. Pulmonary:     Effort: Pulmonary effort is normal.     Breath sounds: Normal breath sounds.  Abdominal:     General: Abdomen is flat. Bowel sounds are normal.     Palpations: Abdomen is soft.  Musculoskeletal:        General: Swelling present.     Cervical back: Neck supple.  Skin:    General: Skin is warm.  Neurological:     General: No focal deficit present.     Mental Status: She is alert and oriented to person, place, and time.  Psychiatric:        Mood and Affect: Mood normal.        Thought Content: Thought content normal.    Labs reviewed: Basic Metabolic Panel: Recent Labs    05/26/20 0935 06/08/20 0000 06/13/20 0000 08/04/20 1205  NA 141 143 142 142  K 3.9 4.1 3.9 4.6  CL 104 105 108 104  CO2 26 31* 24* 27*  GLUCOSE 86  --   --   --   BUN 27* 24* 22* 17  CREATININE 0.94 1.0 0.9 1.0  CALCIUM 9.5 9.4 9.6 10.0   Liver Function Tests: Recent Labs    06/08/20 0000  AST 16  ALT  9   ALKPHOS 138*  ALBUMIN 3.9   No results for input(s): LIPASE, AMYLASE in the last 8760 hours. No results for input(s): AMMONIA in the last 8760 hours. CBC: Recent Labs    05/26/20 0935 06/08/20 0000  WBC 5.7 5.5  NEUTROABS  --  3,515.00  HGB 14.7 14.2  HCT 46.5* 43  MCV 91.7  --   PLT 253 247   Lipid Panel: Recent Labs    11/23/20 0000  CHOL 169  HDL 64  LDLCALC 88  TRIG 80   No results found for: HGBA1C  Procedures since last visit: No results found.  Assessment/Plan 1. Multiple thyroid nodules Patient's nodules have not changed much since 2017 Patient had a discussion with Dr Renne Crigler and had refused further work-up in 2017 I discussed with her again she said that she does not want the biopsy are surgery. We would not do any further surveillance for now.  2. Seizure disorder ([place]) On Lamictal and Keppra  3. Edema, unspecified type Low dose of Lasix  4. Mixed hyperlipidemia On statin  5. Restless legs syndrome (RLS) On Mirapex    Labs/tests ordered:  * No order type specified * Next appt:  Visit date not found

## 2021-05-21 ENCOUNTER — Non-Acute Institutional Stay (SKILLED_NURSING_FACILITY): Payer: Medicare PPO | Admitting: Nurse Practitioner

## 2021-05-21 ENCOUNTER — Encounter: Payer: Self-pay | Admitting: Nurse Practitioner

## 2021-05-21 DIAGNOSIS — R609 Edema, unspecified: Secondary | ICD-10-CM

## 2021-05-21 DIAGNOSIS — K5901 Slow transit constipation: Secondary | ICD-10-CM | POA: Diagnosis not present

## 2021-05-21 DIAGNOSIS — F0390 Unspecified dementia without behavioral disturbance: Secondary | ICD-10-CM

## 2021-05-21 DIAGNOSIS — G2581 Restless legs syndrome: Secondary | ICD-10-CM

## 2021-05-21 DIAGNOSIS — I10 Essential (primary) hypertension: Secondary | ICD-10-CM

## 2021-05-21 DIAGNOSIS — J431 Panlobular emphysema: Secondary | ICD-10-CM

## 2021-05-21 DIAGNOSIS — K219 Gastro-esophageal reflux disease without esophagitis: Secondary | ICD-10-CM

## 2021-05-21 DIAGNOSIS — E782 Mixed hyperlipidemia: Secondary | ICD-10-CM

## 2021-05-21 DIAGNOSIS — M159 Polyosteoarthritis, unspecified: Secondary | ICD-10-CM | POA: Diagnosis not present

## 2021-05-21 DIAGNOSIS — G40909 Epilepsy, unspecified, not intractable, without status epilepticus: Secondary | ICD-10-CM

## 2021-05-21 NOTE — Progress Notes (Deleted)
Location:  [facility] Room Number: [place] of Service:  SNF (31) Provider: Mast, Man X, NP  Patient Care Team: [name], MD as PCP - General (Internal Medicine) [name], MD as Consulting Physician (Ophthalmology) [name], MD as Consulting Physician (Cardiology) [name], MD as Consulting Physician (Dermatology) [name], MD as Consulting Physician (Orthopedic Surgery) [name], MD as Consulting Physician (Orthopedic Surgery) [name], MD as Consulting Physician (Orthopedic Surgery) [place], [place], Man X, NP as Nurse Practitioner (Nurse Practitioner) [name], MD (Inactive) as Consulting Physician (Neurology)  Extended Emergency Contact Information Primary Emergency Contact: [name],[name] Address: [address]          [place], [place] [address] [name] of [place] Phone: [phone] Relation: Sister Secondary Emergency Contact: [name] [place] Phone: [phone] Mobile Phone: [phone] Relation: Sister  Code Status:  DNR Goals of care: Advanced Directive information Advanced Directives 05/21/2021  Does Patient Have a Medical Advance Directive? Yes  Type of Advance Directive Out of facility DNR (pink MOST or yellow form)  Does patient want to make changes to medical advance directive? No - Patient declined  Copy of [place] in Chart? -  Pre-existing out of facility DNR order (yellow form or pink MOST form) Yellow form placed in chart (order not valid for inpatient use)     Chief Complaint  Patient presents with   Medical Management of Chronic Issues    Routine visit.    Immunizations    Discuss the need for COVID booster.    HPI:  Pt is a 88 y.o. female seen today for medical management of chronic diseases.     Past Medical History:  Diagnosis Date   Acute bronchitis 05/23/2011   Acute  upper respiratory infections of unspecified site 05/23/2011   Arthritis    Chronic airway obstruction, not elsewhere classified 05/23/2011   Disturbance of salivary secretion 01/31/2011   Dizziness and giddiness 01/31/2011   Dyspnea    Essential tremor 04/25/2014   External hemorrhoids without mention of complication 23/76/2831   Gait disorder 04/25/2014   Insomnia, unspecified 09/12/2011   Lumbago 01/31/2011   Major depressive disorder, single episode, unspecified 01/31/2011   Memory disorder 04/25/2014   Mitral valve disorders(424.0) 01/31/2011   Other and unspecified hyperlipidemia 01/31/2011   Other convulsions 01/31/2011   Other emphysema ([place]) 01/31/2011   Pain in joint, site unspecified 01/31/2011   Restless legs syndrome (RLS) 09/12/2011   Retinal detachment with retinal defect of right eye 2011   right eye twice   Seizure disorder ([place])    Senile osteoporosis 01/31/2011   Spontaneous ecchymoses 01/31/2011   Stiffness of joints, not elsewhere classified, multiple sites 01/31/2011   Unspecified essential hypertension 01/31/2011   Past Surgical History:  Procedure Laterality Date   ABDOMINAL HYSTERECTOMY  06/21/2003   TAH/BSO, omenectomy PSB resect, Stg IC cystadenofibroma   CHOLECYSTECTOMY  2005   Dr. Marlou Starks   ELBOW SURGERY Right 2008   broken   Dr. Apolonio Schneiders   EYE SURGERY     ORIF PATELLA Left 05/02/2020   Procedure: OPEN REDUCTION INTERNAL (ORIF) FIXATION LEFT PATELLA WITH MEDIAL AND LATERAL LIGAMENT REINFORCEMENTS;  Surgeon: [name], MD;  [facility];  Service: Orthopedics;  Laterality: Left;   ORIF PATELLA Left 05/30/2020   Procedure: OPEN REDUCTION INTERNAL (ORIF) FIXATION PATELLA;  Surgeon: Edmonia Lynch  D, MD;  Location[facility];  Service: Orthopedics;  Laterality: Left;   RETINAL DETACHMENT SURGERY N/A    two   REVERSE SHOULDER ARTHROPLASTY Left 05/06/2019   Procedure: REVERSE SHOULDER ARTHROPLASTY;  Surgeon: Justice Britain, MD;  [facility];  Service:  Orthopedics;  Laterality: Left;  144min   ROTATOR CUFF REPAIR Right 2012   Dr. Theda Sers   SQUAMOUS CELL CARCINOMA EXCISION Bilateral 2012, 8/14   [place] on legs   Dr. Sarajane Jews   TONSILLECTOMY  1941   VIDEO BRONCHOSCOPY WITH ENDOBRONCHIAL NAVIGATION N/A 11/29/2015   Procedure: VIDEO BRONCHOSCOPY WITH ENDOBRONCHIAL NAVIGATION;  Surgeon: Collene Gobble, MD;  Location: [facility];  Service: Thoracic;  Laterality: N/A;    Allergies  Allergen Reactions   Dyazide [Hydrochlorothiazide W-Triamterene]     Other reaction(s): her blood pressure too much   Latex Other (See Comments)    Swelling   Other     Other reaction(s): local red reaction    Rofecoxib     Other reaction(s): shortness of breath   Sulfa Antibiotics Nausea And Vomiting   Tetracycline Hcl     Other reaction(s): Cant take due to a drug interation    Outpatient Encounter Medications as of 05/21/2021  Medication Sig   acetaminophen (TYLENOL) 500 MG tablet Take 500 mg by mouth every 6 (six) hours as needed.   Albuterol Sulfate (PROAIR RESPICLICK) 440 (90 Base) MCG/ACT AEPB Inhale 2 puffs into the lungs as needed.   amLODipine (NORVASC) 2.5 MG tablet Take 2.5 mg by mouth daily.   aspirin 81 MG chewable tablet Chew 81 mg by mouth daily.   chlorhexidine (PERIDEX) 0.12 % solution Use as directed 15 mLs in the mouth or throat at bedtime.   cholecalciferol (VITAMIN D) 1000 units tablet Take 1 tablet (1,000 Units total) by mouth daily.   Dextromethorphan-guaiFENesin (ROBITUSSIN COUGH+ CHEST MAX ST) 10-200 MG/5ML LIQD Take 5 mLs by mouth every 6 (six) hours.   Emollient (CERAVE) CREA Apply 1 application topically daily. Mix cream with Triamcinolone   furosemide (LASIX) 20 MG tablet Take 20 mg by mouth daily.   hydrocortisone 2.5 % lotion Apply 1 application topically daily as needed (psoriasis).   hydrocortisone cream 1 % Apply 1 application topically 2 (two) times daily as needed for itching.   ketoconazole (NIZORAL) 2 % cream Apply 1  application topically as needed (for psoriasis).    ketoconazole (NIZORAL) 2 % shampoo Apply 1 application topically daily.   lamoTRIgine (LAMICTAL) 150 MG tablet Take 150 mg by mouth 2 (two) times daily.   levETIRAcetam (KEPPRA) 250 MG tablet Take 250 mg by mouth daily. In the morning   levETIRAcetam (KEPPRA) 500 MG tablet Take 500 mg by mouth at bedtime.   memantine (NAMENDA) 10 MG tablet TAKE 1 TABLET BY MOUTH TWICE DAILY.   nystatin (MYCOSTATIN/NYSTOP) powder Apply 1 application topically 2 (two) times daily as needed (tinea cruris).   oxyCODONE (OXY IR/ROXICODONE) 5 MG immediate release tablet Take 1 tablet (5 mg total) by mouth every 6 (six) hours as needed for severe pain.   pantoprazole (PROTONIX) 40 MG tablet Take 40 mg by mouth daily.   polyethylene glycol (MIRALAX / GLYCOLAX) 17 g packet Take 17 g by mouth daily.   Potassium Chloride ER 20 MEQ TBCR Take 20 mEq by mouth daily.   pramipexole (MIRAPEX) 0.25 MG tablet Take 0.5 mg by mouth at bedtime.   sennosides-docusate sodium (SENOKOT-S) 8.6-50 MG tablet Take 2 tablets by mouth at bedtime.   simvastatin (  ZOCOR) 10 MG tablet Take 10 mg by mouth at bedtime.   tiotropium (SPIRIVA HANDIHALER) 18 MCG inhalation capsule INHALE CONTENTS OF ONE CAPSULE ONCE DAILY FOR COPD.   triamcinolone (KENALOG) 0.1 % Apply 1 application topically daily. Mix with cerave   [DISCONTINUED] triamcinolone ointment (KENALOG) 0.5 % Apply 1 application topically daily at 12 noon. (Patient not taking: Reported on 05/21/2021)   No facility-administered encounter medications on file as of 05/21/2021.    Review of Systems  Immunization History  Administered Date(s) Administered   Influenza Split 01/06/2014, 01/15/2017, 01/08/2018, 12/09/2018   Influenza Whole 01/07/2012, 01/06/2013   Influenza, High Dose Seasonal PF 12/25/2015, 01/20/2017   Influenza,inj,Quad PF,6+ Mos 12/21/2014   Influenza-Unspecified 12/09/2018, 01/18/2020, 01/24/2021   Moderna Sars-Covid-2  Vaccination 04/12/2019, 06/05/2019, 02/15/2020, 09/05/2020   PFIZER(Purple Top)SARS-COV-2 Vaccination 12/26/2020   Pneumococcal Conjugate-13 02/01/2014   Pneumococcal Polysaccharide-23 12/20/1992, 01/15/2000, 04/08/2004, 06/08/2004   Td 04/08/2002, 04/22/2002   Tdap 04/09/2011, 08/17/2015   Zoster Recombinat (Shingrix) 05/29/2005, 08/27/2017   Zoster, Live 04/08/2008, 05/29/2014, 05/28/2017   Zoster, Unspecified 05/29/2005   Pertinent  Health Maintenance Due  Topic Date Due   INFLUENZA VACCINE  Completed   DEXA SCAN  Completed   MAMMOGRAM  Discontinued   Fall Risk 05/12/2019 05/12/2019 05/13/2019 05/13/2019 04/26/2020  Falls in the past year? - - - - -  Patient Fall Risk Level High fall risk High fall risk High fall risk High fall risk Low fall risk   Functional Status Survey:    Vitals:   05/21/21 1617  BP: (!) 144/88  Pulse: 79  Resp: 16  Temp: (!) 97.1 F (36.2 C)  SpO2: 95%  Weight: 162 lb 12.8 oz (73.8 kg)  Height: 5\' 2"  (1.575 m)   Body mass index is 29.78 kg/m. Physical Exam  Labs reviewed: Recent Labs    05/26/20 0935 06/08/20 0000 06/13/20 0000 08/04/20 1205  NA 141 143 142 142  K 3.9 4.1 3.9 4.6  CL 104 105 108 104  CO2 26 31* 24* 27*  GLUCOSE 86  --   --   --   BUN 27* 24* 22* 17  CREATININE 0.94 1.0 0.9 1.0  CALCIUM 9.5 9.4 9.6 10.0   Recent Labs    06/08/20 0000  AST 16  ALT 9  ALKPHOS 138*  ALBUMIN 3.9   Recent Labs    05/26/20 0935 06/08/20 0000  WBC 5.7 5.5  NEUTROABS  --  3,515.00  HGB 14.7 14.2  HCT 46.5* 43  MCV 91.7  --   PLT 253 247   Lab Results  Component Value Date   TSH 1.79 06/29/2019   No results found for: HGBA1C Lab Results  Component Value Date   CHOL 169 11/23/2020   HDL 64 11/23/2020   LDLCALC 88 11/23/2020   TRIG 80 11/23/2020   CHOLHDL 3.6 06/27/2015    Significant Diagnostic Results in last 30 days:  No results found.  Assessment/Plan There are no diagnoses linked to this encounter.   Family/ staff  Communication: ***  Labs/tests ordered:  ***

## 2021-05-21 NOTE — Assessment & Plan Note (Signed)
ST eval significant reflux as results of mucus production, oropharyngeal dysphagia. Takes Pantoprazole, Hgb 14.2 06/08/20, improved.

## 2021-05-21 NOTE — Assessment & Plan Note (Addendum)
c/o breathing discomfort, SOB,  dizziness, felt weak in general while in therapy, resolved w/o intervention. Vital wnl. Resolved after prn ProAir.  Monitor for respiratory, cardiac, or neurological symptoms. Continue  Spiriva qd, prn ProAir Will update CBC/diff, CMP/eGFR, TSH

## 2021-05-21 NOTE — Assessment & Plan Note (Signed)
Blood pressure is controlled, akes Amlodipine, ASA, Bun/creat 17/1.0 eGFR 52 08/04/20

## 2021-05-21 NOTE — Assessment & Plan Note (Signed)
takes Senokot, MiraLax.

## 2021-05-21 NOTE — Progress Notes (Signed)
Location:   SNF [facility] Room Number: D149/F Place of Service:  SNF (31) Provider: [facility] Peighton Mehra NP  [name], MD  Patient Care Team: [name], MD as PCP - General (Internal Medicine) [name], MD as Consulting Physician (Ophthalmology) [name], MD as Consulting Physician (Cardiology) [name], MD as Consulting Physician (Dermatology) [name], MD as Consulting Physician (Orthopedic Surgery) [name], MD as Consulting Physician (Orthopedic Surgery) [name], MD as Consulting Physician (Orthopedic Surgery) [place], [place], Ahmyah Gidley X, NP as Nurse Practitioner (Nurse Practitioner) [name], MD (Inactive) as Consulting Physician (Neurology)  Extended Emergency Contact Information Primary Emergency Contact: [name],[name] Address: [address]          [place], [place] [address] [name] of [place] Phone: [phone] Relation: Sister Secondary Emergency Contact: [name] [place] of [place] Phone: [phone] Mobile Phone: [phone] Relation: Sister  Code Status: DNR Goals of care: Advanced Directive information Advanced Directives 05/21/2021  Does Patient Have a Medical Advance Directive? Yes  Type of Advance Directive Out of facility DNR (pink MOST or yellow form)  Does patient want to make changes to medical advance directive? No - Patient declined  Copy of [place] in Chart? -  Pre-existing out of facility DNR order (yellow form or pink MOST form) Yellow form placed in chart (order not valid for inpatient use)     Chief Complaint  Patient presents with   Acute Visit    Discomfort breathing    HPI:  Pt is a 88 y.o. female seen today for an acute visit for c/o breathing discomfort, SOB,  dizziness, felt weak in general while in therapy, resolved w/o intervention. Vital wnl. Resolved after prn ProAir.    COVID infection, s/p  Paxlovid, recovered.             A closed nondisplaced transverse fracture left patella 04/26/20, f/u Ortho s/p  ORIF.  prn Oxycodone for pain. Walking with walker. slightly warmer the left knee is chronic.              GERD ST eval significant reflux as results of mucus production, oropharyngeal dysphagia. Takes Pantoprazole, Hgb 14.2 06/08/20, improved.              Restless leg symptom, takes MiraPex              Hx of dementia, high functional in SNF [facility], on Memantine              Hx of seizures, stable, on Keppra, Lamictal             Edema, trace, on Furosemide,  Bun/creat 17/0.99 eGFR 60 08/03/20             OA in general, on Tylenol, Oxycodone             HTN, takes Amlodipine, ASA, Bun/creat 17/1.0 eGFR 52 08/04/20             COPD, takes Spiriva qd, prn ProAir             Constipation, takes Senokot, MiraLax.              OP, stopped Foxamax per her request. takes Vit D.              Hyperlipidemia, takes Simvastatin, LDL 88 11/23/20    Past Medical History:  Diagnosis Date  Acute bronchitis 05/23/2011   Acute upper respiratory infections of unspecified site 05/23/2011   Arthritis    Chronic airway obstruction, not elsewhere classified 05/23/2011   Disturbance of salivary secretion 01/31/2011   Dizziness and giddiness 01/31/2011   Dyspnea    Essential tremor 04/25/2014   External hemorrhoids without mention of complication 78/24/2353   Gait disorder 04/25/2014   Insomnia, unspecified 09/12/2011   Lumbago 01/31/2011   Major depressive disorder, single episode, unspecified 01/31/2011   Memory disorder 04/25/2014   Mitral valve disorders(424.0) 01/31/2011   Other and unspecified hyperlipidemia 01/31/2011   Other convulsions 01/31/2011   Other emphysema ([place]) 01/31/2011   Pain in joint, site unspecified 01/31/2011   Restless legs syndrome (RLS) 09/12/2011   Retinal detachment with retinal defect of right eye 2011   right eye twice   Seizure disorder ([place])    Senile osteoporosis  01/31/2011   Spontaneous ecchymoses 01/31/2011   Stiffness of joints, not elsewhere classified, multiple sites 01/31/2011   Unspecified essential hypertension 01/31/2011   Past Surgical History:  Procedure Laterality Date   ABDOMINAL HYSTERECTOMY  06/21/2003   TAH/BSO, omenectomy PSB resect, Stg IC cystadenofibroma   CHOLECYSTECTOMY  2005   Dr. Marlou Starks   ELBOW SURGERY Right 2008   broken   Dr. Apolonio Schneiders   EYE SURGERY     ORIF PATELLA Left 05/02/2020   Procedure: OPEN REDUCTION INTERNAL (ORIF) FIXATION LEFT PATELLA WITH MEDIAL AND LATERAL LIGAMENT REINFORCEMENTS;  Surgeon: [name], MD;  Location: [facility];  Service: Orthopedics;  Laterality: Left;   ORIF PATELLA Left 05/30/2020   Procedure: OPEN REDUCTION INTERNAL (ORIF) FIXATION PATELLA;  Surgeon: [name], MD;  Location: [facility];  Service: Orthopedics;  Laterality: Left;   RETINAL DETACHMENT SURGERY N/A    two   REVERSE SHOULDER ARTHROPLASTY Left 05/06/2019   Procedure: REVERSE SHOULDER ARTHROPLASTY;  Surgeon: Justice Britain, MD;  Location: [facility];  Service: Orthopedics;  Laterality: Left;  131min   ROTATOR CUFF REPAIR Right 2012   Dr. Theda Sers   SQUAMOUS CELL CARCINOMA EXCISION Bilateral 2012, 8/14   [place] on legs   Dr. Sarajane Jews   TONSILLECTOMY  1941   VIDEO BRONCHOSCOPY WITH ENDOBRONCHIAL NAVIGATION N/A 11/29/2015   Procedure: VIDEO BRONCHOSCOPY WITH ENDOBRONCHIAL NAVIGATION;  Surgeon: Collene Gobble, MD;  Location: [facility] OR;  Service: Thoracic;  Laterality: N/A;    Allergies  Allergen Reactions   Dyazide [Hydrochlorothiazide W-Triamterene]     Other reaction(s): her blood pressure too much   Latex Other (See Comments)    Swelling   Other     Other reaction(s): local red reaction    Rofecoxib     Other reaction(s): shortness of breath   Sulfa Antibiotics Nausea And Vomiting   Tetracycline Hcl     Other reaction(s): Cant take due to a drug interation    Allergies as of 05/21/2021       Reactions   Dyazide  [hydrochlorothiazide W-triamterene]    Other reaction(s): her blood pressure too much   Latex Other (See Comments)   Swelling   Other    Other reaction(s): local red reaction   Rofecoxib    Other reaction(s): shortness of breath   Sulfa Antibiotics Nausea And Vomiting   Tetracycline Hcl    Other reaction(s): Cant take due to a drug interation        Medication List        Accurate as of May 21, 2021 11:59 PM. If you have any questions, ask your nurse  or doctor.          acetaminophen 500 MG tablet Commonly known as: TYLENOL Take 500 mg by mouth every 6 (six) hours as needed.   amLODipine 2.5 MG tablet Commonly known as: NORVASC Take 2.5 mg by mouth daily.   aspirin 81 MG chewable tablet Chew 81 mg by mouth daily.   CeraVe Crea Apply 1 application topically daily. Mix cream with Triamcinolone   chlorhexidine 0.12 % solution Commonly known as: PERIDEX Use as directed 15 mLs in the mouth or throat at bedtime.   cholecalciferol 1000 units tablet Commonly known as: VITAMIN D Take 1 tablet (1,000 Units total) by mouth daily.   furosemide 20 MG tablet Commonly known as: LASIX Take 20 mg by mouth daily.   hydrocortisone 2.5 % lotion Apply 1 application topically daily as needed (psoriasis).   hydrocortisone cream 1 % Apply 1 application topically 2 (two) times daily as needed for itching.   ketoconazole 2 % shampoo Commonly known as: NIZORAL Apply 1 application topically daily.   ketoconazole 2 % cream Commonly known as: NIZORAL Apply 1 application topically as needed (for psoriasis).   lamoTRIgine 150 MG tablet Commonly known as: LAMICTAL Take 150 mg by mouth 2 (two) times daily.   levETIRAcetam 500 MG tablet Commonly known as: KEPPRA Take 500 mg by mouth at bedtime.   levETIRAcetam 250 MG tablet Commonly known as: KEPPRA Take 250 mg by mouth daily. In the morning   memantine 10 MG tablet Commonly known as: NAMENDA TAKE 1 TABLET BY MOUTH  TWICE DAILY.   nystatin powder Commonly known as: MYCOSTATIN/NYSTOP Apply 1 application topically 2 (two) times daily as needed (tinea cruris).   oxyCODONE 5 MG immediate release tablet Commonly known as: Oxy IR/ROXICODONE Take 1 tablet (5 mg total) by mouth every 6 (six) hours as needed for severe pain.   pantoprazole 40 MG tablet Commonly known as: PROTONIX Take 40 mg by mouth daily.   polyethylene glycol 17 g packet Commonly known as: MIRALAX / GLYCOLAX Take 17 g by mouth daily.   Potassium Chloride ER 20 MEQ Tbcr Take 20 mEq by mouth daily.   pramipexole 0.25 MG tablet Commonly known as: MIRAPEX Take 0.5 mg by mouth at bedtime.   ProAir RespiClick 108 (90 Base) MCG/ACT Aepb Generic drug: Albuterol Sulfate Inhale 2 puffs into the lungs as needed.   Robitussin Cough+ Chest Max St 10-200 MG/5ML Liqd Generic drug: Dextromethorphan-guaiFENesin Take 5 mLs by mouth every 6 (six) hours.   sennosides-docusate sodium 8.6-50 MG tablet Commonly known as: SENOKOT-S Take 2 tablets by mouth at bedtime.   simvastatin 10 MG tablet Commonly known as: ZOCOR Take 10 mg by mouth at bedtime.   tiotropium 18 MCG inhalation capsule Commonly known as: Spiriva HandiHaler INHALE CONTENTS OF ONE CAPSULE ONCE DAILY FOR COPD.   triamcinolone cream 0.1 % Commonly known as: KENALOG Apply 1 application topically daily. Mix with cerave What changed: Another medication with the same name was removed. Continue taking this medication, and follow the directions you see here. Changed by: Shadoe Cryan X Agostino Gorin, NP        Review of Systems  Constitutional:  Negative for activity change and fever.  HENT:  Positive for hearing loss. Negative for trouble swallowing.   Eyes:  Negative for visual disturbance.  Respiratory:  Negative for cough.        DOE is chronic  Cardiovascular:  Positive for leg swelling.  Gastrointestinal:  Negative for abdominal pain and constipation.  Acid reflux symptoms.    Genitourinary:  Negative for dysuria and urgency.  Musculoskeletal:  Positive for arthralgias, gait problem and joint swelling.       ORIF of the left patella.   Skin:  Negative for color change.       BLE discoloration, mild erythema left knee.   Neurological:  Positive for seizures. Negative for speech difficulty, weakness and headaches.       Memory lapses. Hx of seizures. RLS  Psychiatric/Behavioral:  Negative for behavioral problems and sleep disturbance. The patient is not nervous/anxious.    Immunization History  Administered Date(s) Administered   Influenza Split 01/06/2014, 01/15/2017, 01/08/2018, 12/09/2018   Influenza Whole 01/07/2012, 01/06/2013   Influenza, High Dose Seasonal PF 12/25/2015, 01/20/2017   Influenza,inj,Quad PF,6+ Mos 12/21/2014   Influenza-Unspecified 12/09/2018, 01/18/2020, 01/24/2021   Moderna Sars-Covid-2 Vaccination 04/12/2019, 06/05/2019, 02/15/2020, 09/05/2020   PFIZER(Purple Top)SARS-COV-2 Vaccination 12/26/2020   Pneumococcal Conjugate-13 02/01/2014   Pneumococcal Polysaccharide-23 12/20/1992, 01/15/2000, 04/08/2004, 06/08/2004   Td 04/08/2002, 04/22/2002   Tdap 04/09/2011, 08/17/2015   Zoster Recombinat (Shingrix) 05/29/2005, 08/27/2017   Zoster, Live 04/08/2008, 05/29/2014, 05/28/2017   Zoster, Unspecified 05/29/2005   Pertinent  Health Maintenance Due  Topic Date Due   INFLUENZA VACCINE  Completed   DEXA SCAN  Completed   MAMMOGRAM  Discontinued   Fall Risk 05/12/2019 05/12/2019 05/13/2019 05/13/2019 04/26/2020  Falls in the past year? - - - - -  Patient Fall Risk Level High fall risk High fall risk High fall risk High fall risk Low fall risk   Functional Status Survey:    Vitals:   05/21/21 1617  BP: (!) 144/88  Pulse: 79  Resp: 16  Temp: (!) 97.1 F (36.2 C)  SpO2: 95%  Weight: 162 lb 12.8 oz (73.8 kg)  Height: $Remove'5\' 2"'NGjarRj$  (1.575 m)   Body mass index is 29.78 kg/m. Physical Exam Vitals and nursing note reviewed.  Constitutional:       Appearance: Normal appearance.  HENT:     Head: Normocephalic and atraumatic.     Nose: Nose normal.     Mouth/Throat:     Mouth: Mucous membranes are moist.  Eyes:     Extraocular Movements: Extraocular movements intact.     Conjunctiva/sclera:     Right eye: Right conjunctiva is not injected.     Left eye: Left conjunctiva is not injected.     Pupils: Pupils are equal, round, and reactive to light.  Neck:     Comments: Left adam's apple bony aspect soreness on palpation is chronic, no noted lymph nodes. Cardiovascular:     Rate and Rhythm: Normal rate and regular rhythm.     Heart sounds: Murmur heard.     Comments: PD pulses are not felt.  Pulmonary:     Effort: Pulmonary effort is normal.     Breath sounds: Rales present.     Comments: Decreased air entry to both lungs. Bibasilar rales.  Abdominal:     General: Bowel sounds are normal.     Palpations: Abdomen is soft.     Tenderness: There is no abdominal tenderness.     Comments: Mid abd surgical scar  Musculoskeletal:        General: No tenderness.     Cervical back: Normal range of motion and neck supple.     Right lower leg: Edema present.     Left lower leg: Edema present.     Comments:  Decreased overhead ROM of the left shoulder. BLE edema  trace  is chronic. Left knee s/p ORIF of the patella, mild warmth has no change.   Skin:    General: Skin is warm and dry.     Comments: Brownish skin discoloration BLE.   Neurological:     General: No focal deficit present.     Mental Status: She is alert and oriented to person, place, and time. Mental status is at baseline.     Gait: Gait abnormal.  Psychiatric:        Mood and Affect: Mood normal.        Behavior: Behavior normal.        Thought Content: Thought content normal.    Labs reviewed: Recent Labs    05/26/20 0935 06/08/20 0000 06/13/20 0000 08/04/20 1205  NA 141 143 142 142  K 3.9 4.1 3.9 4.6  CL 104 105 108 104  CO2 26 31* 24* 27*  GLUCOSE 86  --    --   --   BUN 27* 24* 22* 17  CREATININE 0.94 1.0 0.9 1.0  CALCIUM 9.5 9.4 9.6 10.0   Recent Labs    06/08/20 0000  AST 16  ALT 9  ALKPHOS 138*  ALBUMIN 3.9   Recent Labs    05/26/20 0935 06/08/20 0000  WBC 5.7 5.5  NEUTROABS  --  3,515.00  HGB 14.7 14.2  HCT 46.5* 43  MCV 91.7  --   PLT 253 247   Lab Results  Component Value Date   TSH 1.79 06/29/2019   No results found for: HGBA1C Lab Results  Component Value Date   CHOL 169 11/23/2020   HDL 64 11/23/2020   LDLCALC 88 11/23/2020   TRIG 80 11/23/2020   CHOLHDL 3.6 06/27/2015    Significant Diagnostic Results in last 30 days:  No results found.  Assessment/Plan: COPD (chronic obstructive pulmonary disease) (HCC) c/o breathing discomfort, SOB,  dizziness, felt weak in general while in therapy, resolved w/o intervention. Vital wnl. Resolved after prn ProAir.  Monitor for respiratory, cardiac, or neurological symptoms. Continue  Spiriva qd, prn ProAir Will update CBC/diff, CMP/eGFR, TSH  Seizure disorder (HCC)  stable, on Keppra, Lamictal  Edema  trace, on Furosemide,  Bun/creat 17/0.99 eGFR 60 08/03/20  Generalized osteoarthritis of multiple sites  in general, on Tylenol, Oxycodone  A closed nondisplaced transverse fracture left patella 04/26/20, f/u Ortho s/p  ORIF.  prn Oxycodone for pain. Walking with walker. slightly warmer the left knee is chronic.   GERD (gastroesophageal reflux disease) ST eval significant reflux as results of mucus production, oropharyngeal dysphagia. Takes Pantoprazole, Hgb 14.2 06/08/20, improved.   Restless legs syndrome (RLS) Stable,  takes MiraPex   Senile dementia ([place]) high functional in SNF [facility], on Memantine   Essential hypertension Blood pressure is controlled, akes Amlodipine, ASA, Bun/creat 17/1.0 eGFR 52 08/04/20  Slow transit constipation  takes Senokot, MiraLax.   Hyperlipidemia takes Simvastatin, LDL 88 11/23/20    Family/ staff Communication: plan of care  reviewed with the patient and charge nurse.   Labs/tests ordered:  CBC/diff, CMP/eGFR, TSH   Time spend 35 minutes.

## 2021-05-21 NOTE — Assessment & Plan Note (Signed)
high functional in SN[facility], on Memantine  

## 2021-05-21 NOTE — Assessment & Plan Note (Signed)
stable, on Keppra, Lamictal 

## 2021-05-21 NOTE — Assessment & Plan Note (Signed)
Stable,  takes MiraPex

## 2021-05-21 NOTE — Assessment & Plan Note (Signed)
takes Simvastatin, LDL 88 11/23/20

## 2021-05-21 NOTE — Assessment & Plan Note (Signed)
in general, on Tylenol, Oxycodone  A closed nondisplaced transverse fracture left patella 04/26/20, f/u Ortho s/p  ORIF.  prn Oxycodone for pain. Walking with walker. slightly warmer the left knee is chronic.

## 2021-05-21 NOTE — Assessment & Plan Note (Signed)
trace, on Furosemide,  Bun/creat 17/0.99 eGFR 60 08/03/20 

## 2021-05-22 ENCOUNTER — Encounter: Payer: Self-pay | Admitting: Nurse Practitioner

## 2021-05-22 DIAGNOSIS — I10 Essential (primary) hypertension: Secondary | ICD-10-CM | POA: Diagnosis not present

## 2021-05-22 DIAGNOSIS — E039 Hypothyroidism, unspecified: Secondary | ICD-10-CM | POA: Diagnosis not present

## 2021-05-22 LAB — BASIC METABOLIC PANEL
BUN: 23 — AB (ref 4–21)
CO2: 29 — AB (ref 13–22)
Chloride: 106 (ref 99–108)
Creatinine: 0.9 (ref 0.5–1.1)
Glucose: 76
Potassium: 4 mEq/L (ref 3.5–5.1)
Sodium: 141 (ref 137–147)

## 2021-05-22 LAB — COMPREHENSIVE METABOLIC PANEL
Albumin: 4.1 (ref 3.5–5.0)
Calcium: 9.2 (ref 8.7–10.7)
Globulin: 2.2
eGFR: 64

## 2021-05-22 LAB — CBC AND DIFFERENTIAL
HCT: 36 (ref 36–46)
Hemoglobin: 11.5 — AB (ref 12.0–16.0)
Neutrophils Absolute: 1962
Platelets: 220 10*3/uL (ref 150–400)
WBC: 3.9

## 2021-05-22 LAB — HEPATIC FUNCTION PANEL
ALT: 12 U/L (ref 7–35)
AST: 16 (ref 13–35)
Bilirubin, Total: 0.3

## 2021-05-22 LAB — CBC: RBC: 4.37 (ref 3.87–5.11)

## 2021-05-22 LAB — TSH: TSH: 1.89 (ref 0.41–5.90)

## 2021-06-04 ENCOUNTER — Non-Acute Institutional Stay (SKILLED_NURSING_FACILITY): Payer: Medicare PPO | Admitting: Nurse Practitioner

## 2021-06-04 ENCOUNTER — Encounter: Payer: Self-pay | Admitting: Nurse Practitioner

## 2021-06-04 DIAGNOSIS — K5901 Slow transit constipation: Secondary | ICD-10-CM

## 2021-06-04 DIAGNOSIS — R609 Edema, unspecified: Secondary | ICD-10-CM | POA: Diagnosis not present

## 2021-06-04 DIAGNOSIS — J431 Panlobular emphysema: Secondary | ICD-10-CM

## 2021-06-04 DIAGNOSIS — M810 Age-related osteoporosis without current pathological fracture: Secondary | ICD-10-CM

## 2021-06-04 DIAGNOSIS — M159 Polyosteoarthritis, unspecified: Secondary | ICD-10-CM | POA: Diagnosis not present

## 2021-06-04 DIAGNOSIS — K219 Gastro-esophageal reflux disease without esophagitis: Secondary | ICD-10-CM

## 2021-06-04 DIAGNOSIS — F0390 Unspecified dementia without behavioral disturbance: Secondary | ICD-10-CM

## 2021-06-04 DIAGNOSIS — G40909 Epilepsy, unspecified, not intractable, without status epilepticus: Secondary | ICD-10-CM | POA: Diagnosis not present

## 2021-06-04 DIAGNOSIS — E782 Mixed hyperlipidemia: Secondary | ICD-10-CM | POA: Diagnosis not present

## 2021-06-04 DIAGNOSIS — I10 Essential (primary) hypertension: Secondary | ICD-10-CM

## 2021-06-04 DIAGNOSIS — G2581 Restless legs syndrome: Secondary | ICD-10-CM

## 2021-06-04 NOTE — Progress Notes (Signed)
Location:   [facility] Room Number: 68 Place of Service:  SNF (31) Provider:  Kariel Skillman X, NP  [name], MD  Patient Care Team: [name], MD as PCP - General (Internal Medicine) [name], MD as Consulting Physician (Ophthalmology) [name], MD as Consulting Physician (Cardiology) [name], MD as Consulting Physician (Dermatology) [name], MD as Consulting Physician (Orthopedic Surgery) [name], MD as Consulting Physician (Orthopedic Surgery) [name], MD as Consulting Physician (Orthopedic Surgery) [place], [place], Kiet Geer X, NP as Nurse Practitioner (Nurse Practitioner) [name], MD (Inactive) as Consulting Physician (Neurology)  Extended Emergency Contact Information Primary Emergency Contact: [name],[name] Address: [address]          [place], [place] [address] [name] of [place] Phone: [phone] Relation: Sister Secondary Emergency Contact: [name] [place] of [place] Phone: [phone] Mobile Phone: [phone] Relation: Sister  Code Status:  DNR Goals of care: Advanced Directive information Advanced Directives 06/04/2021  Does Patient Have a Medical Advance Directive? Yes  Type of Advance Directive Out of facility DNR (pink MOST or yellow form)  Does patient want to make changes to medical advance directive? No - Patient declined  Copy of [place] in Chart? -  Pre-existing out of facility DNR order (yellow form or pink MOST form) Pink MOST form placed in chart (order not valid for inpatient use);Yellow form placed in chart (order not valid for inpatient use)     Chief Complaint  Patient presents with   Medical Management of Chronic Issues    Routine follow up visit.   Health Maintenance    Discuss the need for 4th COVID booster    HPI:  Pt is a 88 y.o. female seen today for medical management of  chronic diseases.     COVID infection, s/p Paxlovid, recovered.             A closed nondisplaced transverse fracture left patella 04/26/20, f/u Ortho s/p  ORIF.  prn Oxycodone for pain. Walking with walker. slightly warmer the left knee is chronic.              GERD ST eval significant reflux as results of mucus production, oropharyngeal dysphagia. Takes Pantoprazole, Hgb 14.2 06/08/20, improved.              Restless leg symptom, takes MiraPex              Hx of dementia, high functional in SNF [facility], on Memantine              Hx of seizures, stable, on Keppra, Lamictal             Edema, trace, on Furosemide,  Bun/creat 17/0.99 eGFR 60 08/03/20             OA in general, on Tylenol, Oxycodone             HTN, takes Amlodipine, ASA, Bun/creat 17/1.0 eGFR 52 08/04/20             COPD, takes Spiriva qd, prn ProAir             Constipation, takes Senokot, MiraLax.              OP, stopped Foxamax per her request. takes Vit D.              Hyperlipidemia, takes  Simvastatin, LDL 88 11/23/20  Past Medical History:  Diagnosis Date   Acute bronchitis 05/23/2011   Acute upper respiratory infections of unspecified site 05/23/2011   Arthritis    Chronic airway obstruction, not elsewhere classified 05/23/2011   Disturbance of salivary secretion 01/31/2011   Dizziness and giddiness 01/31/2011   Dyspnea    Essential tremor 04/25/2014   External hemorrhoids without mention of complication 09/64/3838   Gait disorder 04/25/2014   Insomnia, unspecified 09/12/2011   Lumbago 01/31/2011   Major depressive disorder, single episode, unspecified 01/31/2011   Memory disorder 04/25/2014   Mitral valve disorders(424.0) 01/31/2011   Other and unspecified hyperlipidemia 01/31/2011   Other convulsions 01/31/2011   Other emphysema ([place]) 01/31/2011   Pain in joint, site unspecified 01/31/2011   Restless legs syndrome (RLS) 09/12/2011   Retinal detachment with retinal defect of right eye 2011   right eye twice   Seizure  disorder ([place])    Senile osteoporosis 01/31/2011   Spontaneous ecchymoses 01/31/2011   Stiffness of joints, not elsewhere classified, multiple sites 01/31/2011   Unspecified essential hypertension 01/31/2011   Past Surgical History:  Procedure Laterality Date   ABDOMINAL HYSTERECTOMY  06/21/2003   TAH/BSO, omenectomy PSB resect, Stg IC cystadenofibroma   CHOLECYSTECTOMY  2005   Dr. Marlou Starks   ELBOW SURGERY Right 2008   broken   Dr. Apolonio Schneiders   EYE SURGERY     ORIF PATELLA Left 05/02/2020   Procedure: OPEN REDUCTION INTERNAL (ORIF) FIXATION LEFT PATELLA WITH MEDIAL AND LATERAL LIGAMENT REINFORCEMENTS;  Surgeon: [name], MD;  [facility]: [facility];  Service: Orthopedics;  Laterality: Left;   ORIF PATELLA Left 05/30/2020   Procedure: OPEN REDUCTION INTERNAL (ORIF) FIXATION PATELLA;  Surgeon: [name], MD;  Location: [facility];  Service: Orthopedics;  Laterality: Left;   RETINAL DETACHMENT SURGERY N/A    two   REVERSE SHOULDER ARTHROPLASTY Left 05/06/2019   Procedure: REVERSE SHOULDER ARTHROPLASTY;  Surgeon: Justice Britain, MD;  [facility]: [facility];  Service: Orthopedics;  Laterality: Left;  130min   ROTATOR CUFF REPAIR Right 2012   Dr. Theda Sers   SQUAMOUS CELL CARCINOMA EXCISION Bilateral 2012, 8/14   [place] on legs   Dr. Sarajane Jews   TONSILLECTOMY  1941   VIDEO BRONCHOSCOPY WITH ENDOBRONCHIAL NAVIGATION N/A 11/29/2015   Procedure: VIDEO BRONCHOSCOPY WITH ENDOBRONCHIAL NAVIGATION;  Surgeon: Collene Gobble, MD;  Location: [facility];  Service: Thoracic;  Laterality: N/A;    Allergies  Allergen Reactions   Dyazide [Hydrochlorothiazide W-Triamterene]     Other reaction(s): her blood pressure too much   Latex Other (See Comments)    Swelling   Other     Other reaction(s): local red reaction    Rofecoxib     Other reaction(s): shortness of breath   Sulfa Antibiotics Nausea And Vomiting   Tetracycline Hcl     Other reaction(s): Cant take due to a drug interation    Allergies as of 06/04/2021        Reactions   Dyazide [hydrochlorothiazide W-triamterene]    Other reaction(s): her blood pressure too much   Latex Other (See Comments)   Swelling   Other    Other reaction(s): local red reaction   Rofecoxib    Other reaction(s): shortness of breath   Sulfa Antibiotics Nausea And Vomiting   Tetracycline Hcl    Other reaction(s): Cant take due to a drug interation        Medication List        Accurate as of  June 04, 2021 11:59 PM. If you have any questions, ask your nurse or doctor.          STOP taking these medications    nystatin powder Commonly known as: MYCOSTATIN/NYSTOP Stopped by: Yasuo Phimmasone X Mahamadou Weltz, NP   polyethylene glycol 17 g packet Commonly known as: MIRALAX / GLYCOLAX Stopped by: Trena Dunavan X Videl Nobrega, NP   Robitussin Cough+ Chest Max St 10-200 MG/5ML Liqd Generic drug: Dextromethorphan-guaiFENesin Stopped by: Ravonda Brecheen X Elliott Lasecki, NP       TAKE these medications    acetaminophen 500 MG tablet Commonly known as: TYLENOL Take 500 mg by mouth every 6 (six) hours as needed.   amLODipine 2.5 MG tablet Commonly known as: NORVASC Take 2.5 mg by mouth daily.   aspirin 81 MG chewable tablet Chew 81 mg by mouth daily.   CeraVe Crea Apply 1 application topically daily. Mix cream with Triamcinolone   chlorhexidine 0.12 % solution Commonly known as: PERIDEX Use as directed 15 mLs in the mouth or throat at bedtime.   cholecalciferol 1000 units tablet Commonly known as: VITAMIN D Take 1 tablet (1,000 Units total) by mouth daily.   furosemide 20 MG tablet Commonly known as: LASIX Take 20 mg by mouth daily.   hydrocortisone 2.5 % lotion Apply 1 application topically daily as needed (psoriasis).   hydrocortisone cream 1 % Apply 1 application topically 2 (two) times daily as needed for itching.   ketoconazole 2 % shampoo Commonly known as: NIZORAL Apply 1 application topically daily.   ketoconazole 2 % cream Commonly known as: NIZORAL Apply 1 application  topically as needed (for psoriasis).   lamoTRIgine 150 MG tablet Commonly known as: LAMICTAL Take 150 mg by mouth 2 (two) times daily.   levETIRAcetam 500 MG tablet Commonly known as: KEPPRA Take 500 mg by mouth at bedtime.   levETIRAcetam 250 MG tablet Commonly known as: KEPPRA Take 250 mg by mouth daily. In the morning   memantine 10 MG tablet Commonly known as: NAMENDA TAKE 1 TABLET BY MOUTH TWICE DAILY.   oxyCODONE 5 MG immediate release tablet Commonly known as: Oxy IR/ROXICODONE Take 1 tablet (5 mg total) by mouth every 6 (six) hours as needed for severe pain.   pantoprazole 40 MG tablet Commonly known as: PROTONIX Take 40 mg by mouth daily.   Potassium Chloride ER 20 MEQ Tbcr Take 20 mEq by mouth daily.   pramipexole 0.25 MG tablet Commonly known as: MIRAPEX Take 0.5 mg by mouth at bedtime.   ProAir RespiClick 161 (90 Base) MCG/ACT Aepb Generic drug: Albuterol Sulfate Inhale 2 puffs into the lungs as needed.   sennosides-docusate sodium 8.6-50 MG tablet Commonly known as: SENOKOT-S Take 2 tablets by mouth at bedtime.   simvastatin 10 MG tablet Commonly known as: ZOCOR Take 10 mg by mouth at bedtime.   tiotropium 18 MCG inhalation capsule Commonly known as: Spiriva HandiHaler INHALE CONTENTS OF ONE CAPSULE ONCE DAILY FOR COPD.   triamcinolone cream 0.1 % Commonly known as: KENALOG Apply 1 application topically daily. Mix with cerave        Review of Systems  Constitutional:  Negative for fatigue, fever and unexpected weight change.  HENT:  Positive for hearing loss. Negative for trouble swallowing.   Eyes:  Negative for visual disturbance.  Respiratory:  Negative for cough.        DOE is chronic  Cardiovascular:  Positive for leg swelling.  Gastrointestinal:  Negative for abdominal pain and constipation.  Acid reflux symptoms.   Genitourinary:  Negative for dysuria and urgency.  Musculoskeletal:  Positive for arthralgias, gait problem and  joint swelling.       ORIF of the left patella.   Skin:  Negative for color change.       BLE discoloration, mild erythema left knee.   Neurological:  Positive for seizures. Negative for speech difficulty, weakness and headaches.       Memory lapses. Hx of seizures. RLS  Psychiatric/Behavioral:  Negative for behavioral problems and sleep disturbance. The patient is not nervous/anxious.    Immunization History  Administered Date(s) Administered   Influenza Split 01/06/2014, 01/15/2017, 01/08/2018, 12/09/2018   Influenza Whole 01/07/2012, 01/06/2013   Influenza, High Dose Seasonal PF 12/25/2015, 01/20/2017   Influenza,inj,Quad PF,6+ Mos 12/21/2014   Influenza-Unspecified 12/09/2018, 01/18/2020, 01/24/2021   Moderna Sars-Covid-2 Vaccination 04/12/2019, 06/05/2019, 02/15/2020, 09/05/2020   PFIZER(Purple Top)SARS-COV-2 Vaccination 12/26/2020   Pneumococcal Conjugate-13 02/01/2014   Pneumococcal Polysaccharide-23 12/20/1992, 01/15/2000, 04/08/2004, 06/08/2004   Td 04/08/2002, 04/22/2002   Tdap 04/09/2011, 08/17/2015   Zoster Recombinat (Shingrix) 05/29/2005, 08/27/2017   Zoster, Live 04/08/2008, 05/29/2014, 05/28/2017   Zoster, Unspecified 05/29/2005   Pertinent  Health Maintenance Due  Topic Date Due   INFLUENZA VACCINE  Completed   DEXA SCAN  Completed   MAMMOGRAM  Discontinued   Fall Risk 05/12/2019 05/12/2019 05/13/2019 05/13/2019 04/26/2020  Falls in the past year? - - - - -  Patient Fall Risk Level High fall risk High fall risk High fall risk High fall risk Low fall risk   Functional Status Survey:    Vitals:   06/04/21 1330  Temp: 98.7 F (37.1 C)   There is no height or weight on file to calculate BMI. Physical Exam Vitals and nursing note reviewed.  Constitutional:      Appearance: Normal appearance.  HENT:     Head: Normocephalic and atraumatic.     Nose: Nose normal.     Mouth/Throat:     Mouth: Mucous membranes are moist.  Eyes:     Extraocular Movements:  Extraocular movements intact.     Conjunctiva/sclera:     Right eye: Right conjunctiva is not injected.     Left eye: Left conjunctiva is not injected.     Pupils: Pupils are equal, round, and reactive to light.  Neck:     Comments: Left adam's apple bony aspect, sometimes feels soreness on palpation is chronic, no noted lymph nodes. Cardiovascular:     Rate and Rhythm: Normal rate and regular rhythm.     Heart sounds: Murmur heard.     Comments: PD pulses are not felt.  Pulmonary:     Effort: Pulmonary effort is normal.     Breath sounds: Rales present.     Comments: Decreased air entry to both lungs. Bibasilar rales.  Abdominal:     General: Bowel sounds are normal.     Palpations: Abdomen is soft.     Tenderness: There is no abdominal tenderness.     Comments: Mid abd surgical scar  Musculoskeletal:        General: No tenderness.     Cervical back: Normal range of motion and neck supple.     Right lower leg: Edema present.     Left lower leg: Edema present.     Comments:  Decreased overhead ROM of the left shoulder. BLE edema trace  is chronic. Left knee s/p ORIF of the patella, mild warmth has no change.   Skin:  General: Skin is warm and dry.     Comments: Brownish skin discoloration BLE.   Neurological:     General: No focal deficit present.     Mental Status: She is alert and oriented to person, place, and time. Mental status is at baseline.     Gait: Gait abnormal.  Psychiatric:        Mood and Affect: Mood normal.        Behavior: Behavior normal.        Thought Content: Thought content normal.    Labs reviewed: Recent Labs    06/13/20 0000 08/04/20 1205  NA 142 142  K 3.9 4.6  CL 108 104  CO2 24* 27*  BUN 22* 17  CREATININE 0.9 1.0  CALCIUM 9.6 10.0   No results for input(s): AST, ALT, ALKPHOS, BILITOT, PROT, ALBUMIN in the last 8760 hours.  No results for input(s): WBC, NEUTROABS, HGB, HCT, MCV, PLT in the last 8760 hours.  Lab Results  Component  Value Date   TSH 1.79 06/29/2019   No results found for: HGBA1C Lab Results  Component Value Date   CHOL 169 11/23/2020   HDL 64 11/23/2020   LDLCALC 88 11/23/2020   TRIG 80 11/23/2020   CHOLHDL 3.6 06/27/2015    Significant Diagnostic Results in last 30 days:  No results found.  Assessment/Plan COPD (chronic obstructive pulmonary disease) (HCC) Stable,  takes Spiriva qd, prn ProAir  Slow transit constipation Stable, takes Senokot, MiraLax.   Senile osteoporosis stopped Foxamax per her request. takes Vit D.   Hyperlipidemia  takes Simvastatin, LDL 88 11/23/20  Essential hypertension Blood pressure is controlled, takes Amlodipine, ASA, Bun/creat 17/1.0 eGFR 52 08/04/20  Generalized osteoarthritis of multiple sites Ambulates with walker, takes Tylenol, Oxycodone  Edema Chronic trace edema BLE,  on Furosemide,  Bun/creat 17/0.99 eGFR 60 08/03/20  Seizure disorder (HCC)  stable, on Keppra, Lamictal  Senile dementia (HCC) high functional in SNF [facility], on Memantine   Restless legs syndrome (RLS) Sleeps well at night,  takes MiraPex   GERD (gastroesophageal reflux disease) ST eval significant reflux as results of mucus production, oropharyngeal dysphagia. Takes Pantoprazole, Hgb 14.2 06/08/20, improved     Family/ staff Communication: plan of care reviewed with the patient and charge nurse.   Labs/tests ordered:  none  Time spend 35 minutes.

## 2021-06-05 NOTE — Assessment & Plan Note (Signed)
high functional in SN[facility], on Memantine  

## 2021-06-05 NOTE — Assessment & Plan Note (Signed)
Ambulates with walker, takes Tylenol, Oxycodone

## 2021-06-05 NOTE — Assessment & Plan Note (Signed)
ST eval significant reflux as results of mucus production, oropharyngeal dysphagia. Takes Pantoprazole, Hgb 14.2 06/08/20, improved

## 2021-06-05 NOTE — Assessment & Plan Note (Signed)
stable, on Keppra, Lamictal 

## 2021-06-05 NOTE — Assessment & Plan Note (Signed)
takes Simvastatin, LDL 88 11/23/20

## 2021-06-05 NOTE — Assessment & Plan Note (Signed)
Chronic trace edema BLE,  on Furosemide,  Bun/creat 17/0.99 eGFR 60 08/03/20

## 2021-06-05 NOTE — Assessment & Plan Note (Signed)
stopped Foxamax per her request. takes Vit D.  

## 2021-06-05 NOTE — Assessment & Plan Note (Signed)
Stable, takes Senokot, MiraLax.

## 2021-06-05 NOTE — Assessment & Plan Note (Signed)
Sleeps well at night,  takes MiraPex

## 2021-06-05 NOTE — Assessment & Plan Note (Signed)
Stable, takes Spiriva qd, prn ProAir 

## 2021-06-05 NOTE — Assessment & Plan Note (Signed)
Blood pressure is controlled, takes Amlodipine, ASA, Bun/creat 17/1.0 eGFR 52 08/04/20

## 2021-06-11 ENCOUNTER — Encounter: Payer: Self-pay | Admitting: Nurse Practitioner

## 2021-07-06 ENCOUNTER — Non-Acute Institutional Stay (SKILLED_NURSING_FACILITY): Payer: Medicare PPO | Admitting: Nurse Practitioner

## 2021-07-06 ENCOUNTER — Encounter: Payer: Self-pay | Admitting: Nurse Practitioner

## 2021-07-06 DIAGNOSIS — G40909 Epilepsy, unspecified, not intractable, without status epilepticus: Secondary | ICD-10-CM

## 2021-07-06 DIAGNOSIS — G2581 Restless legs syndrome: Secondary | ICD-10-CM | POA: Diagnosis not present

## 2021-07-06 DIAGNOSIS — M159 Polyosteoarthritis, unspecified: Secondary | ICD-10-CM | POA: Diagnosis not present

## 2021-07-06 DIAGNOSIS — K219 Gastro-esophageal reflux disease without esophagitis: Secondary | ICD-10-CM

## 2021-07-06 DIAGNOSIS — J431 Panlobular emphysema: Secondary | ICD-10-CM

## 2021-07-06 DIAGNOSIS — F0390 Unspecified dementia without behavioral disturbance: Secondary | ICD-10-CM | POA: Diagnosis not present

## 2021-07-06 DIAGNOSIS — E782 Mixed hyperlipidemia: Secondary | ICD-10-CM

## 2021-07-06 DIAGNOSIS — K5901 Slow transit constipation: Secondary | ICD-10-CM

## 2021-07-06 DIAGNOSIS — R609 Edema, unspecified: Secondary | ICD-10-CM | POA: Diagnosis not present

## 2021-07-06 DIAGNOSIS — R238 Other skin changes: Secondary | ICD-10-CM

## 2021-07-06 DIAGNOSIS — M810 Age-related osteoporosis without current pathological fracture: Secondary | ICD-10-CM | POA: Diagnosis not present

## 2021-07-06 DIAGNOSIS — I10 Essential (primary) hypertension: Secondary | ICD-10-CM | POA: Diagnosis not present

## 2021-07-06 NOTE — Progress Notes (Signed)
?Location:   [name] ?Nursing Home Room Number: 35 A ?Place of Service:  SNF (31) ?Provider:  Bijan Ridgley X, NP ? ?[name], MD ? ?Patient Care Team: ?[name], MD as PCP - General (Internal Medicine) ?[name], MD as Consulting Physician (Ophthalmology) ?[name], MD as Consulting Physician (Cardiology) ?[name], MD as Consulting Physician (Dermatology) ?[name], MD as Consulting Physician (Orthopedic Surgery) ?[name], MD as Consulting Physician (Orthopedic Surgery) ?[name], MD as Consulting Physician (Orthopedic Surgery) ?[name] ?Britani Beattie X, NP as Nurse Practitioner (Nurse Practitioner) ?[name], MD (Inactive) as Consulting Physician (Neurology) ? ?Extended Emergency Contact Information ?Primary Emergency Contact: [name],[name] ?Address: [address] ?         [address] ?         [place], [place] [address] [place] ?Home Phone: [phone] ?Relation: Sister ?Secondary Emergency Contact: [name],[name] ? [place] ?Home Phone: [phone] ?Mobile Phone: [phone] ?Relation: Sister ? ?Code Status:  DNR ?Goals of care: Advanced Directive information ? ?  07/06/2021  ? 10:15 AM  ?Advanced Directives  ?Does Patient Have a Medical Advance Directive? Yes  ?Type of Advance Directive Out of facility DNR (pink MOST or yellow form)  ?Does patient want to make changes to medical advance directive? No - Patient declined  ?Pre-existing out of facility DNR order (yellow form or pink MOST form) Pink MOST form placed in chart (order not valid for inpatient use);Yellow form placed in chart (order not valid for inpatient use)  ? ? ? ?Chief Complaint  ?Patient presents with  ? Medical Management of Chronic Issues  ?  Routine follow up  ? ? ?HPI:  ?Pt is a 88 y.o. female seen today for medical management of chronic diseases.   ? C/o burning sensation in urogenital, buttocks region 2/2 adult depend use, no  apparent rash or vaginal discharge. Denied dysuria, vaginal symptoms, or abd pain.  ?A closed nondisplaced transverse fracture left patella 04/26/20, f/u Ortho s/p  ORIF.  prn Oxycodone for pain. Walking with walker. slightly warmer the left knee is chronic.  ?            GERD ST eval significant reflux as results of mucus production, oropharyngeal dysphagia. Takes Pantoprazole, Hgb 14.2 06/08/20, improved.  ?            Restless leg symptom, takes MiraPex  ?            Hx of dementia, high functional in SNF [facility], on Memantine  ?            Hx of seizures, stable, on Keppra, Lamictal ?            Edema, trace, on Furosemide,  Bun/creat 23/0.9 05/22/21 ?            OA in general, on Tylenol, Oxycodone ?            HTN, takes Amlodipine, ASA ?            COPD, takes Spiriva qd, prn ProAir ?            Constipation, takes Senokot, MiraLax.  ?            OP, stopped Foxamax per her request. takes Vit D.  ?            Hyperlipidemia, takes Simvastatin, LDL 88 11/23/20 ? ?Past Medical History:  ?Diagnosis Date  ? Acute bronchitis 05/23/2011  ?  Acute upper respiratory infections of unspecified site 05/23/2011  ? Arthritis   ? Chronic airway obstruction, not elsewhere classified 05/23/2011  ? Disturbance of salivary secretion 01/31/2011  ? Dizziness and giddiness 01/31/2011  ? Dyspnea   ? Essential tremor 04/25/2014  ? External hemorrhoids without mention of complication 32/95/1884  ? Gait disorder 04/25/2014  ? Insomnia, unspecified 09/12/2011  ? Lumbago 01/31/2011  ? Major depressive disorder, single episode, unspecified 01/31/2011  ? Memory disorder 04/25/2014  ? Mitral valve disorders(424.0) 01/31/2011  ? Other and unspecified hyperlipidemia 01/31/2011  ? Other convulsions 01/31/2011  ? Other emphysema ([place]) 01/31/2011  ? Pain in joint, site unspecified 01/31/2011  ? Restless legs syndrome (RLS) 09/12/2011  ? Retinal detachment with retinal defect of right eye 2011  ? right eye twice  ? Seizure disorder ([place])   ? Senile osteoporosis  01/31/2011  ? Spontaneous ecchymoses 01/31/2011  ? Stiffness of joints, not elsewhere classified, multiple sites 01/31/2011  ? Unspecified essential hypertension 01/31/2011  ? ?Past Surgical History:  ?Procedure Laterality Date  ? ABDOMINAL HYSTERECTOMY  06/21/2003  ? TAH/BSO, omenectomy PSB resect, Stg IC cystadenofibroma  ? CHOLECYSTECTOMY  2005  ? Dr. Marlou Starks  ? ELBOW SURGERY Right 2008  ? broken   Dr. Apolonio Schneiders  ? EYE SURGERY    ? ORIF PATELLA Left 05/02/2020  ? Procedure: OPEN REDUCTION INTERNAL (ORIF) FIXATION LEFT PATELLA WITH MEDIAL AND LATERAL LIGAMENT REINFORCEMENTS;  Surgeon: [name], MD;  Location: [facility];  Service: Orthopedics;  Laterality: Left;  ? ORIF PATELLA Left 05/30/2020  ? Procedure: OPEN REDUCTION INTERNAL (ORIF) FIXATION PATELLA;  Surgeon: [name], MD;  [facility]: [facility];  Service: Orthopedics;  Laterality: Left;  ? RETINAL DETACHMENT SURGERY N/A   ? two  ? REVERSE SHOULDER ARTHROPLASTY Left 05/06/2019  ? Procedure: REVERSE SHOULDER ARTHROPLASTY;  Surgeon: Justice Britain, MD;  [facility]: [facility];  Service: Orthopedics;  Laterality: Left;  141mn  ? ROTATOR CUFF REPAIR Right 2012  ? Dr. CTheda Sers ? SQUAMOUS CELL CARCINOMA EXCISION Bilateral 2012, 8/14  ? [place] on legs   Dr. GSarajane Jews ? TONSILLECTOMY  1941  ? VIDEO BRONCHOSCOPY WITH ENDOBRONCHIAL NAVIGATION N/A 11/29/2015  ? Procedure: VIDEO BRONCHOSCOPY WITH ENDOBRONCHIAL NAVIGATION;  Surgeon: RCollene Gobble MD;  [facility]  Service: Thoracic;  Laterality: N/A;  ? ? ?Allergies  ?Allergen Reactions  ? Dyazide [Hydrochlorothiazide W-Triamterene]   ?  Other reaction(s): her blood pressure too much  ? Latex Other (See Comments)  ?  Swelling  ? Other   ?  Other reaction(s): local red reaction ?  ? Rofecoxib   ?  Other reaction(s): shortness of breath  ? Sulfa Antibiotics Nausea And Vomiting  ? Tetracycline Hcl   ?  Other reaction(s): Cant take due to a drug interation  ? ? ?Allergies as of 07/06/2021   ? ?   Reactions  ? Dyazide  [hydrochlorothiazide W-triamterene]   ? Other reaction(s): her blood pressure too much  ? Latex Other (See Comments)  ? Swelling  ? Other   ? Other reaction(s): local red reaction  ? Rofecoxib   ? Other reaction(s): shortness of breath  ? Sulfa Antibiotics Nausea And Vomiting  ? Tetracycline Hcl   ? Other reaction(s): Cant take due to a drug interation  ? ?  ? ?  ?Medication List  ?  ? ?  ? Accurate as of July 06, 2021 11:59 PM. If you have any questions, ask your nurse or doctor.  ?  ?  ? ?  ? ?  acetaminophen 500 MG tablet ?Commonly known as: TYLENOL ?Take 500 mg by mouth every 6 (six) hours as needed. ?  ?amLODipine 2.5 MG tablet ?Commonly known as: NORVASC ?Take 2.5 mg by mouth daily. ?  ?aspirin 81 MG chewable tablet ?Chew 81 mg by mouth daily. ?  ?CeraVe Crea ?Apply 1 application topically daily. Mix cream with Triamcinolone ?  ?chlorhexidine 0.12 % solution ?Commonly known as: PERIDEX ?Use as directed 15 mLs in the mouth or throat at bedtime. ?  ?cholecalciferol 1000 units tablet ?Commonly known as: VITAMIN D ?Take 1 tablet (1,000 Units total) by mouth daily. ?  ?furosemide 20 MG tablet ?Commonly known as: LASIX ?Take 20 mg by mouth daily. ?  ?hydrocortisone 2.5 % lotion ?Apply 1 application topically daily as needed (psoriasis). ?  ?hydrocortisone cream 1 % ?Apply 1 application topically 2 (two) times daily as needed for itching. ?  ?ketoconazole 2 % shampoo ?Commonly known as: NIZORAL ?Apply 1 application topically daily. ?  ?ketoconazole 2 % cream ?Commonly known as: NIZORAL ?Apply 1 application topically as needed (for psoriasis). ?  ?lamoTRIgine 150 MG tablet ?Commonly known as: LAMICTAL ?Take 150 mg by mouth 2 (two) times daily. ?  ?levETIRAcetam 500 MG tablet ?Commonly known as: KEPPRA ?Take 500 mg by mouth at bedtime. ?  ?levETIRAcetam 250 MG tablet ?Commonly known as: KEPPRA ?Take 250 mg by mouth daily. In the morning ?  ?memantine 10 MG tablet ?Commonly known as: NAMENDA ?TAKE 1 TABLET BY MOUTH TWICE  DAILY. ?  ?oxyCODONE 5 MG immediate release tablet ?Commonly known as: Oxy IR/ROXICODONE ?Take 1 tablet (5 mg total) by mouth every 6 (six) hours as needed for severe pain. ?  ?pantoprazole 40 MG tablet ?Commonly k

## 2021-07-10 ENCOUNTER — Encounter: Payer: Self-pay | Admitting: Nurse Practitioner

## 2021-07-10 NOTE — Assessment & Plan Note (Signed)
takes Simvastatin, LDL 88 11/23/20 ?

## 2021-07-10 NOTE — Assessment & Plan Note (Signed)
stopped Foxamax per her request. takes Vit D.  

## 2021-07-10 NOTE — Assessment & Plan Note (Signed)
Blood pressure is controlled, takes Amlodipine, ASA ?

## 2021-07-10 NOTE — Assessment & Plan Note (Signed)
stable, on Keppra, Lamictal 

## 2021-07-10 NOTE — Assessment & Plan Note (Signed)
trace, on Furosemide,  Bun/creat 23/0.9 05/22/21 ?

## 2021-07-10 NOTE — Assessment & Plan Note (Signed)
ST eval significant reflux as results of mucus production, oropharyngeal dysphagia. Takes Pantoprazole, Hgb 14.2 06/08/20, improved.  ?

## 2021-07-10 NOTE — Assessment & Plan Note (Signed)
Stable, takes Spiriva qd, prn ProAir 

## 2021-07-10 NOTE — Assessment & Plan Note (Signed)
n general, on Tylenol, Oxycodone, ambulates with walker.  ?

## 2021-07-10 NOTE — Assessment & Plan Note (Signed)
Stable,  takes MiraPex  ?

## 2021-07-10 NOTE — Assessment & Plan Note (Signed)
Stable,  takes Senokot, MiraLax.  ?

## 2021-07-10 NOTE — Assessment & Plan Note (Signed)
high functional in SN[facility], on Memantine  

## 2021-07-12 DIAGNOSIS — R238 Other skin changes: Secondary | ICD-10-CM | POA: Insufficient documentation

## 2021-07-12 NOTE — Assessment & Plan Note (Signed)
Apply Nystatin powder bid to urogenital, buttocks x 2weeks. Observe.  ?

## 2021-07-30 ENCOUNTER — Non-Acute Institutional Stay (SKILLED_NURSING_FACILITY): Payer: Medicare PPO | Admitting: Nurse Practitioner

## 2021-07-30 ENCOUNTER — Encounter: Payer: Self-pay | Admitting: Nurse Practitioner

## 2021-07-30 DIAGNOSIS — R609 Edema, unspecified: Secondary | ICD-10-CM | POA: Diagnosis not present

## 2021-07-30 DIAGNOSIS — G40909 Epilepsy, unspecified, not intractable, without status epilepticus: Secondary | ICD-10-CM

## 2021-07-30 DIAGNOSIS — G2581 Restless legs syndrome: Secondary | ICD-10-CM

## 2021-07-30 DIAGNOSIS — E782 Mixed hyperlipidemia: Secondary | ICD-10-CM

## 2021-07-30 DIAGNOSIS — M810 Age-related osteoporosis without current pathological fracture: Secondary | ICD-10-CM

## 2021-07-30 DIAGNOSIS — J431 Panlobular emphysema: Secondary | ICD-10-CM | POA: Diagnosis not present

## 2021-07-30 DIAGNOSIS — K5901 Slow transit constipation: Secondary | ICD-10-CM | POA: Diagnosis not present

## 2021-07-30 DIAGNOSIS — H00011 Hordeolum externum right upper eyelid: Secondary | ICD-10-CM

## 2021-07-30 DIAGNOSIS — F0390 Unspecified dementia without behavioral disturbance: Secondary | ICD-10-CM

## 2021-07-30 DIAGNOSIS — I10 Essential (primary) hypertension: Secondary | ICD-10-CM

## 2021-07-30 NOTE — Progress Notes (Signed)
?Location:  [name] ?Nursing Home Room Number: NO29/A ?Place of Service:  SNF (31) ?Provider:  Emmy Keng X, NP ? ?[name], MD ? ?Patient Care Team: ?[name], MD as PCP - General (Internal Medicine) ?[name], MD as Consulting Physician (Ophthalmology) ?[name], MD as Consulting Physician (Cardiology) ?[name], MD as Consulting Physician (Dermatology) ?[name], MD as Consulting Physician (Orthopedic Surgery) ?[name], MD as Consulting Physician (Orthopedic Surgery) ?[name], MD as Consulting Physician (Orthopedic Surgery) ?[name] ?Emrys Mceachron X, NP as Nurse Practitioner (Nurse Practitioner) ?[name], MD (Inactive) as Consulting Physician (Neurology) ? ?Extended Emergency Contact Information ?Primary Emergency Contact: [name],[name] ?Address: [address] ?         [address] ?         [place], [place] [address] [place] ?Home Phone: [phone] ?Relation: Sister ?Secondary Emergency Contact: [name],[name] ? [place] ?Home Phone: [phone] ?Mobile Phone: [phone] ?Relation: Sister ? ?Code Status:  DNR ?Goals of care: Advanced Directive information ? ?  07/30/2021  ? 10:53 AM  ?Advanced Directives  ?Does Patient Have a Medical Advance Directive? Yes  ?Type of Advance Directive Out of facility DNR (pink MOST or yellow form)  ?Does patient want to make changes to medical advance directive? No - Guardian declined  ?Pre-existing out of facility DNR order (yellow form or pink MOST form) Pink MOST form placed in chart (order not valid for inpatient use);Yellow form placed in chart (order not valid for inpatient use)  ? ? ? ?Chief Complaint  ?Patient presents with  ? Medical Management of Chronic Issues  ?  Patient is here for F/U for chronic conditions  ? ? ?HPI:  ?Pt is a 88 y.o. female seen today for managing chronic medication conditions.  ?  OA in general, on Tylenol, Oxycodone ?A closed  nondisplaced transverse fracture left patella 04/26/20, f/u Ortho s/p  ORIF.  prn Oxycodone for pain. Walking with walker. slightly warmer the left knee is chronic.  ?            GERD ST eval significant reflux as results of mucus production, oropharyngeal dysphagia. Takes Pantoprazole, Hgb 14.2 06/08/20, improved.  ?            Restless leg symptom, takes MiraPex  ?            Hx of dementia, high functional in SNF [facility], on Memantine  ?            Hx of seizures, stable, on Keppra, Lamictal ?            Edema, trace, on Furosemide,  Bun/creat 23/0.9 05/22/21 ?            HTN, takes Amlodipine, ASA ?            COPD, takes Spiriva qd, prn ProAir ?            Constipation, takes Senokot, MiraLax.  ?            OP, stopped Foxamax per her request. takes Vit D.  ?            Hyperlipidemia, takes Simvastatin, LDL 88 11/23/20 ?  ? ? ?Past Medical History:  ?Diagnosis Date  ? Acute bronchitis 05/23/2011  ? Acute upper respiratory infections of unspecified site 05/23/2011  ? Arthritis   ? Chronic airway obstruction, not elsewhere classified 05/23/2011  ? Disturbance of salivary secretion 01/31/2011  ?  Dizziness and giddiness 01/31/2011  ? Dyspnea   ? Essential tremor 04/25/2014  ? External hemorrhoids without mention of complication 18/56/3149  ? Gait disorder 04/25/2014  ? Insomnia, unspecified 09/12/2011  ? Lumbago 01/31/2011  ? Major depressive disorder, single episode, unspecified 01/31/2011  ? Memory disorder 04/25/2014  ? Mitral valve disorders(424.0) 01/31/2011  ? Other and unspecified hyperlipidemia 01/31/2011  ? Other convulsions 01/31/2011  ? Other emphysema ([place]) 01/31/2011  ? Pain in joint, site unspecified 01/31/2011  ? Restless legs syndrome (RLS) 09/12/2011  ? Retinal detachment with retinal defect of right eye 2011  ? right eye twice  ? Seizure disorder ([employer])   ? Senile osteoporosis 01/31/2011  ? Spontaneous ecchymoses 01/31/2011  ? Stiffness of joints, not elsewhere classified, multiple sites 01/31/2011  ? Unspecified  essential hypertension 01/31/2011  ? ?Past Surgical History:  ?Procedure Laterality Date  ? ABDOMINAL HYSTERECTOMY  06/21/2003  ? TAH/BSO, omenectomy PSB resect, Stg IC cystadenofibroma  ? CHOLECYSTECTOMY  2005  ? Dr. Marlou Starks  ? ELBOW SURGERY Right 2008  ? broken   Dr. Apolonio Schneiders  ? EYE SURGERY    ? ORIF PATELLA Left 05/02/2020  ? Procedure: OPEN REDUCTION INTERNAL (ORIF) FIXATION LEFT PATELLA WITH MEDIAL AND LATERAL LIGAMENT REINFORCEMENTS;  Surgeon: [name], MD;  Location: [facility];  Service: Orthopedics;  Laterality: Left;  ? ORIF PATELLA Left 05/30/2020  ? Procedure: OPEN REDUCTION INTERNAL (ORIF) FIXATION PATELLA;  Surgeon: [name], MD;  Location: [facility];  Service: Orthopedics;  Laterality: Left;  ? RETINAL DETACHMENT SURGERY N/A   ? two  ? REVERSE SHOULDER ARTHROPLASTY Left 05/06/2019  ? Procedure: REVERSE SHOULDER ARTHROPLASTY;  Surgeon: Justice Britain, MD;  Location: [facility];  Service: Orthopedics;  Laterality: Left;  127mn  ? ROTATOR CUFF REPAIR Right 2012  ? Dr. CTheda Sers ? SQUAMOUS CELL CARCINOMA EXCISION Bilateral 2012, 8/14  ? [place] on legs   Dr. GSarajane Jews ? TONSILLECTOMY  1941  ? VIDEO BRONCHOSCOPY WITH ENDOBRONCHIAL NAVIGATION N/A 11/29/2015  ? Procedure: VIDEO BRONCHOSCOPY WITH ENDOBRONCHIAL NAVIGATION;  Surgeon: RCollene Gobble MD;  Location[place] [facility]  Service: Thoracic;  Laterality: N/A;  ? ? ?Allergies  ?Allergen Reactions  ? Dyazide [Hydrochlorothiazide W-Triamterene]   ?  Other reaction(s): her blood pressure too much  ? Latex Other (See Comments)  ?  Swelling  ? Other   ?  Other reaction(s): local red reaction ?  ? Rofecoxib   ?  Other reaction(s): shortness of breath  ? Sulfa Antibiotics Nausea And Vomiting  ? Tetracycline Hcl   ?  Other reaction(s): Cant take due to a drug interation  ? ? ?Outpatient Encounter Medications as of 07/30/2021  ?Medication Sig  ? acetaminophen (TYLENOL) 500 MG tablet Take 500 mg by mouth every 6 (six) hours as needed.  ? Albuterol Sulfate (PROAIR RESPICLICK) 1702 (90 Base) MCG/ACT AEPB Inhale 2 puffs into the lungs as needed.  ? amLODipine (NORVASC) 2.5 MG tablet Take 2.5 mg by mouth daily.  ? aspirin 81 MG chewable tablet Chew 81 mg by mouth daily.  ? chlorhexidine (PERIDEX) 0.12 % solution Use as directed 15 mLs in the mouth or throat at bedtime.  ? cholecalciferol (VITAMIN D) 1000 units tablet Take 1 tablet (1,000 Units total) by mouth daily.  ? Emollient (CERAVE) CREA Apply 1 application topically daily. Mix cream with Triamcinolone  ? furosemide (LASIX) 20 MG tablet Take 20 mg by mouth daily.  ? hydrocortisone 2.5 % lotion Apply 1 application topically daily as  needed (psoriasis).  ? hydrocortisone cream 1 % Apply 1 application topically 2 (two) times daily as needed for itching.  ? ketoconazole (NIZORAL) 2 % cream Apply 1 application topically as needed (for psoriasis).   ? ketoconazole (NIZORAL) 2 % shampoo Apply 1 application topically daily.  ? lamoTRIgine (LAMICTAL) 150 MG tablet Take 150 mg by mouth 2 (two) times daily.  ? levETIRAcetam (KEPPRA) 250 MG tablet Take 250 mg by mouth daily. In the morning  ? levETIRAcetam (KEPPRA) 500 MG tablet Take 500 mg by mouth at bedtime.  ? memantine (NAMENDA) 10 MG tablet TAKE 1 TABLET BY MOUTH TWICE DAILY.  ? oxyCODONE (OXY IR/ROXICODONE) 5 MG immediate release tablet Take 1 tablet (5 mg total) by mouth every 6 (six) hours as needed for severe pain.  ? pantoprazole (PROTONIX) 40 MG tablet Take 40 mg by mouth daily.  ? Potassium Chloride ER 20 MEQ TBCR Take 20 mEq by mouth daily.  ? pramipexole (MIRAPEX) 0.25 MG tablet Take 0.5 mg by mouth at bedtime.  ? sennosides-docusate sodium (SENOKOT-S) 8.6-50 MG tablet Take 2 tablets by mouth at bedtime.  ? simvastatin (ZOCOR) 10 MG tablet Take 10 mg by mouth at bedtime.  ? tiotropium (SPIRIVA HANDIHALER) 18 MCG inhalation capsule INHALE CONTENTS OF ONE CAPSULE ONCE DAILY FOR COPD.  ? triamcinolone (KENALOG) 0.1 % Apply 1 application topically daily. Mix with cerave  ? ?No  facility-administered encounter medications on file as of 07/30/2021.  ? ? ?Review of Systems  ?Constitutional:  Negative for fatigue, fever and unexpected weight change.  ?HENT:  Positive for hearing loss. Negative for

## 2021-07-31 ENCOUNTER — Encounter: Payer: Self-pay | Admitting: Nurse Practitioner

## 2021-07-31 NOTE — Assessment & Plan Note (Signed)
high functional in SN[facility], on Memantine  

## 2021-07-31 NOTE — Assessment & Plan Note (Signed)
stopped Foxamax per her request. takes Vit D.  

## 2021-07-31 NOTE — Assessment & Plan Note (Signed)
Blood pressure is controlled,  takes Amlodipine, ASA ?

## 2021-07-31 NOTE — Assessment & Plan Note (Signed)
trace, on Furosemide,  Bun/creat 23/0.9 05/22/21 ?

## 2021-07-31 NOTE — Assessment & Plan Note (Signed)
takes Simvastatin, LDL 88 11/23/20 ?

## 2021-07-31 NOTE — Assessment & Plan Note (Signed)
Stable, takes Spiriva qd, prn ProAir 

## 2021-07-31 NOTE — Assessment & Plan Note (Signed)
Controlled, takes MiraPex  ?

## 2021-07-31 NOTE — Assessment & Plan Note (Signed)
stable, on Keppra, Lamictal 

## 2021-07-31 NOTE — Assessment & Plan Note (Signed)
Stable, takes Senokot, MiraLax.  ?

## 2021-08-02 ENCOUNTER — Encounter: Payer: Self-pay | Admitting: Nurse Practitioner

## 2021-08-02 NOTE — Assessment & Plan Note (Signed)
Lateral right lower eyelid, will warm compress 2mn/each, qid x7 days, apply 0.5% Erythromycin ophthalmic oint 1cm ribbon to the right lower conjunctival sac qhs x 2 weeks  ?

## 2021-08-22 ENCOUNTER — Encounter: Payer: Self-pay | Admitting: Orthopedic Surgery

## 2021-08-22 NOTE — Progress Notes (Signed)
This encounter was created in error - please disregard.

## 2021-08-24 ENCOUNTER — Encounter: Payer: Self-pay | Admitting: Orthopedic Surgery

## 2021-08-24 ENCOUNTER — Non-Acute Institutional Stay (SKILLED_NURSING_FACILITY): Payer: Medicare PPO | Admitting: Orthopedic Surgery

## 2021-08-24 DIAGNOSIS — Z9889 Other specified postprocedural states: Secondary | ICD-10-CM | POA: Diagnosis not present

## 2021-08-24 DIAGNOSIS — R600 Localized edema: Secondary | ICD-10-CM

## 2021-08-24 DIAGNOSIS — K219 Gastro-esophageal reflux disease without esophagitis: Secondary | ICD-10-CM | POA: Diagnosis not present

## 2021-08-24 DIAGNOSIS — G2581 Restless legs syndrome: Secondary | ICD-10-CM | POA: Diagnosis not present

## 2021-08-24 DIAGNOSIS — J431 Panlobular emphysema: Secondary | ICD-10-CM

## 2021-08-24 DIAGNOSIS — E782 Mixed hyperlipidemia: Secondary | ICD-10-CM

## 2021-08-24 DIAGNOSIS — F0390 Unspecified dementia without behavioral disturbance: Secondary | ICD-10-CM

## 2021-08-24 DIAGNOSIS — G40909 Epilepsy, unspecified, not intractable, without status epilepticus: Secondary | ICD-10-CM

## 2021-08-24 DIAGNOSIS — I10 Essential (primary) hypertension: Secondary | ICD-10-CM | POA: Diagnosis not present

## 2021-08-24 NOTE — Progress Notes (Signed)
Location:  [facility] Room Number: [place] of Service:  SNF [phone]) Provider: Yvonna Alanis, NP  Patient Care Team: [name], MD as PCP - General (Internal Medicine) [name], MD as Consulting Physician (Ophthalmology) [name], MD as Consulting Physician (Cardiology) [name], MD as Consulting Physician (Dermatology) [name], MD as Consulting Physician (Orthopedic Surgery) [name], MD as Consulting Physician (Orthopedic Surgery) [name], MD as Consulting Physician (Orthopedic Surgery) Newell, Baldwin, Man X, NP as Nurse Practitioner (Nurse Practitioner) [name], MD (Inactive) as Consulting Physician (Neurology)  Extended Emergency Contact Information Primary Emergency Contact: [name],[name] Address: [address]          [place], [place] [address] [name] of [place] Phone: [phone] Relation: Sister Secondary Emergency Contact: [name] [place] Phone: [phone] Mobile Phone: [phone] Relation: Sister  Code Status:  DNR Goals of care: Advanced Directive information    08/24/2021    1:42 PM  Advanced Directives  Does Patient Have a Medical Advance Directive? Yes  Type of Advance Directive Out of facility DNR (pink MOST or yellow form)  Does patient want to make changes to medical advance directive? No - Patient declined  Pre-existing out of facility DNR order (yellow form or pink MOST form) Pink MOST form placed in chart (order not valid for inpatient use);Yellow form placed in chart (order not valid for inpatient use)     Chief Complaint  Patient presents with   Medical Management of Chronic Issues    Routine visit.     HPI:  Pt is a 88 y.o. female seen today for medical management of chronic diseases.    She currently resides on the skilled nursing unit at [facility]. PMH: HTN, PAD, COPD, lung  nodules, panlobular emphysema, GERD, constipation, thyroid nodule, seizures, dementia, skin cancer, OA, osteoporosis, insomnia, and generalized weakness.   Dementia- no recent behavioral outbursts, MMSE 30/30 12/2019, CT head 2021 chronic microvascular angiopathic changes, ambulates with walker, no behavioral outbursts, remains on Namenda Restless legs- remains on Mirapex Seizure- no recent seizures, remains on Keppra and lamictal HTN- BUN/creat 23/0.9 05/22/2021, remains on amlodipine HLD- LDL 88 11/2020, remains on statin COPD/ Panlobular emphysema- does not use oxygen, remains on Spiriva and Proair prn S/p ORIF patella- ambulating well without pain, continues to use walker, remains on oxycodone prn for pain Edema-wears compression stockings, remains on furosemide GERD- hgb 11.5 05/22/2021, no increased reflux, remains on Protonix  No recent falls or injuries.   Recent blood pressures:  05/15- 146/66  05/08- 118/68  05/01- 152/85  Recent weights:  05/01- 164.4 lbs  04/01- 162.6 lbs  03/01- 162.3 lbs  Past Medical History:  Diagnosis Date   Acute bronchitis 05/23/2011   Acute upper respiratory infections of unspecified site 05/23/2011   Arthritis    Chronic airway obstruction, not elsewhere classified 05/23/2011   Disturbance of salivary secretion 01/31/2011   Dizziness and giddiness 01/31/2011   Dyspnea    Essential tremor 04/25/2014   External hemorrhoids without mention of complication 31/49/7026   Gait disorder 04/25/2014   Insomnia, unspecified 09/12/2011   Lumbago 01/31/2011   Major depressive disorder, single episode, unspecified 01/31/2011   Memory disorder 04/25/2014   Mitral valve disorders(424.0) 01/31/2011   Other and unspecified hyperlipidemia 01/31/2011   Other convulsions 01/31/2011   Other emphysema ([place]) 01/31/2011   Pain in joint, site  unspecified 01/31/2011   Restless legs syndrome (RLS) 09/12/2011   Retinal detachment with retinal defect of right eye 2011    right eye twice   Seizure disorder ([place])    Senile osteoporosis 01/31/2011   Spontaneous ecchymoses 01/31/2011   Stiffness of joints, not elsewhere classified, multiple sites 01/31/2011   Unspecified essential hypertension 01/31/2011   Past Surgical History:  Procedure Laterality Date   ABDOMINAL HYSTERECTOMY  06/21/2003   TAH/BSO, omenectomy PSB resect, Stg IC cystadenofibroma   CHOLECYSTECTOMY  2005   Dr. Marlou Starks   ELBOW SURGERY Right 2008   broken   Dr. Apolonio Schneiders   EYE SURGERY     ORIF PATELLA Left 05/02/2020   Procedure: OPEN REDUCTION INTERNAL (ORIF) FIXATION LEFT PATELLA WITH MEDIAL AND LATERAL LIGAMENT REINFORCEMENTS;  Surgeon: [name], MD;  [facility];  Service: Orthopedics;  Laterality: Left;   ORIF PATELLA Left 05/30/2020   Procedure: OPEN REDUCTION INTERNAL (ORIF) FIXATION PATELLA;  Surgeon: [name], MD;  [facility];  Service: Orthopedics;  Laterality: Left;   RETINAL DETACHMENT SURGERY N/A    two   REVERSE SHOULDER ARTHROPLASTY Left 05/06/2019   Procedure: REVERSE SHOULDER ARTHROPLASTY;  Surgeon: Justice Britain, MD;  [facility];  Service: Orthopedics;  Laterality: Left;  149mn   ROTATOR CUFF REPAIR Right 2012   Dr. CTheda Sers  SQUAMOUS CELL CARCINOMA EXCISION Bilateral 2012, 8/14   [place] on legs   Dr. GSarajane Jews  TONSILLECTOMY  1941   VIDEO BRONCHOSCOPY WITH ENDOBRONCHIAL NAVIGATION N/A 11/29/2015   Procedure: VIDEO BRONCHOSCOPY WITH ENDOBRONCHIAL NAVIGATION;  Surgeon: RCollene Gobble MD;  Location: [facility]  Service: Thoracic;  Laterality: N/A;    Allergies  Allergen Reactions   Dyazide [Hydrochlorothiazide W-Triamterene]     Other reaction(s): her blood pressure too much   Latex Other (See Comments)    Swelling   Other     Other reaction(s): local red reaction    Rofecoxib     Other reaction(s): shortness of breath   Sulfa Antibiotics Nausea And Vomiting   Tetracycline Hcl     Other reaction(s): Cant take due to a drug interation     Outpatient Encounter Medications as of 08/24/2021  Medication Sig   acetaminophen (TYLENOL) 500 MG tablet Take 500 mg by mouth every 6 (six) hours as needed.   Albuterol Sulfate (PROAIR RESPICLICK) 1003(90 Base) MCG/ACT AEPB Inhale 2 puffs into the lungs as needed.   amLODipine (NORVASC) 2.5 MG tablet Take 2.5 mg by mouth daily.   aspirin 81 MG chewable tablet Chew 81 mg by mouth daily.   chlorhexidine (PERIDEX) 0.12 % solution Use as directed 15 mLs in the mouth or throat at bedtime.   cholecalciferol (VITAMIN D) 1000 units tablet Take 1 tablet (1,000 Units total) by mouth daily.   Emollient (CERAVE) CREA Apply 1 application topically daily. Mix cream with Triamcinolone   furosemide (LASIX) 20 MG tablet Take 20 mg by mouth daily.   hydrocortisone 2.5 % lotion Apply 1 application topically daily as needed (psoriasis).   hydrocortisone cream 1 % Apply 1 application topically 2 (two) times daily as needed for itching.   ketoconazole (NIZORAL) 2 % cream Apply 1 application topically as needed (for psoriasis).    ketoconazole (NIZORAL) 2 % shampoo Apply 1 application topically daily.   lamoTRIgine (LAMICTAL) 150 MG tablet Take 150 mg by mouth 2 (two) times daily.   levETIRAcetam (KEPPRA) 250 MG tablet Take 250 mg by mouth daily. In the morning  levETIRAcetam (KEPPRA) 500 MG tablet Take 500 mg by mouth at bedtime.   memantine (NAMENDA) 10 MG tablet TAKE 1 TABLET BY MOUTH TWICE DAILY.   nystatin (MYCOSTATIN/NYSTOP) powder Apply 1 application. topically 2 (two) times daily as needed (Apply to urogenital area).   oxyCODONE (OXY IR/ROXICODONE) 5 MG immediate release tablet Take 1 tablet (5 mg total) by mouth every 6 (six) hours as needed for severe pain.   pantoprazole (PROTONIX) 40 MG tablet Take 40 mg by mouth daily.   Potassium Chloride ER 20 MEQ TBCR Take 20 mEq by mouth daily.   pramipexole (MIRAPEX) 0.25 MG tablet Take 0.5 mg by mouth at bedtime.   sennosides-docusate sodium (SENOKOT-S)  8.6-50 MG tablet Take 2 tablets by mouth at bedtime.   simvastatin (ZOCOR) 10 MG tablet Take 10 mg by mouth at bedtime.   tiotropium (SPIRIVA HANDIHALER) 18 MCG inhalation capsule INHALE CONTENTS OF ONE CAPSULE ONCE DAILY FOR COPD.   triamcinolone (KENALOG) 0.1 % Apply 1 application topically daily. Mix with cerave   No facility-administered encounter medications on file as of 08/24/2021.    Review of Systems  Constitutional:  Negative for activity change, appetite change, chills, fatigue and fever.  HENT:  Negative for congestion and trouble swallowing.   Eyes:  Negative for visual disturbance.  Respiratory:  Positive for shortness of breath and wheezing. Negative for cough.   Cardiovascular:  Positive for leg swelling. Negative for chest pain.  Gastrointestinal:  Positive for constipation. Negative for abdominal distention, abdominal pain, blood in stool, diarrhea, nausea and vomiting.  Genitourinary:  Negative for dysuria, frequency and hematuria.  Musculoskeletal:  Positive for arthralgias, back pain and gait problem.  Skin:  Negative for wound.  Neurological:  Positive for weakness and numbness. Negative for dizziness and headaches.  Psychiatric/Behavioral:  Positive for confusion. Negative for dysphoric mood and sleep disturbance. The patient is not nervous/anxious.    Immunization History  Administered Date(s) Administered   Influenza Split 01/06/2014, 01/15/2017, 01/08/2018, 12/09/2018   Influenza Whole 01/07/2012, 01/06/2013   Influenza, High Dose Seasonal PF 12/25/2015, 01/20/2017   Influenza,inj,Quad PF,6+ Mos 12/21/2014   Influenza-Unspecified 12/09/2018, 01/18/2020, 01/24/2021   Moderna SARS-COV2 Booster Vaccination 04/02/2021   Moderna Sars-Covid-2 Vaccination 04/12/2019, 06/05/2019, 02/15/2020, 09/05/2020   PFIZER(Purple Top)SARS-COV-2 Vaccination 12/26/2020   Pneumococcal Conjugate-13 02/01/2014   Pneumococcal Polysaccharide-23 12/20/1992, 01/15/2000, 04/08/2004,  06/08/2004   Td 04/08/2002, 04/22/2002   Tdap 04/09/2011, 08/17/2015   Zoster Recombinat (Shingrix) 05/29/2005, 08/27/2017   Zoster, Live 04/08/2008, 05/29/2014, 05/28/2017   Zoster, Unspecified 05/29/2005   Pertinent  Health Maintenance Due  Topic Date Due   INFLUENZA VACCINE  11/06/2021   DEXA SCAN  Completed   MAMMOGRAM  Discontinued      05/12/2019    2:37 AM 05/12/2019    8:00 AM 05/13/2019    1:00 AM 05/13/2019    9:15 AM 04/26/2020    4:06 PM  Fall Risk  Patient Fall Risk Level High fall risk High fall risk High fall risk High fall risk Low fall risk   Functional Status Survey:    Vitals:   08/24/21 1338  BP: (!) 142/66  Pulse: 95  Resp: 20  Temp: (!) 96.8 F (36 C)  SpO2: 96%  Weight: 163 lb 11.2 oz (74.3 kg)  Height: '5\' 2"'$  (1.575 m)   Body mass index is 29.94 kg/m. Physical Exam Constitutional:      General: She is not in acute distress. HENT:     Head: Normocephalic.  Eyes:  General:        Right eye: No discharge.        Left eye: No discharge.  Cardiovascular:     Rate and Rhythm: Normal rate and regular rhythm.     Pulses: Normal pulses.     Heart sounds: Normal heart sounds.  Pulmonary:     Effort: Pulmonary effort is normal. No respiratory distress.     Breath sounds: Normal breath sounds. No wheezing or rales.  Abdominal:     General: Bowel sounds are normal. There is no distension.     Palpations: Abdomen is soft.     Tenderness: There is no abdominal tenderness. There is no guarding.  Musculoskeletal:     Cervical back: Neck supple.     Right lower leg: Edema present.     Left lower leg: Edema present.     Comments: Non pitting  Skin:    General: Skin is warm and dry.     Comments: Tan discoloration to lower extremities  Neurological:     General: No focal deficit present.     Mental Status: She is alert. Mental status is at baseline.     Motor: Weakness present.     Gait: Gait abnormal.     Comments: walker  Psychiatric:         Behavior: Behavior normal.     Comments: Very pleasant, follows commands, alert to self/person/situation    Labs reviewed: Recent Labs    05/22/21 0000  NA 141  K 4.0  CL 106  CO2 29*  BUN 23*  CREATININE 0.9  CALCIUM 9.2   Recent Labs    05/22/21 0000  AST 16  ALT 12  ALBUMIN 4.1   Recent Labs    05/22/21 0000  WBC 3.9  NEUTROABS 1,962.00  HGB 11.5*  HCT 36  PLT 220   Lab Results  Component Value Date   TSH 12.00 (A) 05/22/2021   No results found for: HGBA1C Lab Results  Component Value Date   CHOL 169 11/23/2020   HDL 64 11/23/2020   LDLCALC 88 11/23/2020   TRIG 80 11/23/2020   CHOLHDL 3.6 06/27/2015    Significant Diagnostic Results in last 30 days:  No results found.  Assessment/Plan 1. Senile dementia ([place]) - no behavioral outbursts - ambulates with walker - cont skilled nursing care - cont Namenda  2. Restless legs syndrome (RLS) - table - cont Mirapex  3. Seizure disorder ([place]) - no recent episodes - followed by neurology - cont Keppra and Lamictal  4. Essential hypertension - controlled - cont amlodipine  5. Mixed hyperlipidemia - LDL stable - cont simvastatin  6. Panlobular emphysema ([facility]) - not on oxygen - cont Spiriva and Proair prn  7. s/p ORIF of Patella - cont oxycodone prn  8. Bilateral lower extremity edema - non pitting - wears compression stockings - cont furosemide  9. Gastroesophageal reflux disease, unspecified whether esophagitis present - hgb stable - cont Protonix    Family/ staff Communication: plan discussed with patient and nurse  Labs/tests ordered:  none

## 2021-08-29 ENCOUNTER — Other Ambulatory Visit: Payer: Self-pay

## 2021-08-29 ENCOUNTER — Emergency Department (HOSPITAL_COMMUNITY): Payer: Medicare PPO

## 2021-08-29 ENCOUNTER — Encounter (HOSPITAL_COMMUNITY): Payer: Self-pay | Admitting: Pharmacy Technician

## 2021-08-29 ENCOUNTER — Inpatient Hospital Stay (HOSPITAL_COMMUNITY)
Admission: EM | Admit: 2021-08-29 | Discharge: 2021-09-08 | DRG: 481 | Disposition: A | Discharge status: Skilled Nursing Facility | Payer: Medicare PPO | Source: Skilled Nursing Facility | Attending: Internal Medicine | Admitting: Internal Medicine

## 2021-08-29 DIAGNOSIS — M810 Age-related osteoporosis without current pathological fracture: Secondary | ICD-10-CM | POA: Diagnosis present

## 2021-08-29 DIAGNOSIS — W19XXXA Unspecified fall, initial encounter: Secondary | ICD-10-CM | POA: Diagnosis not present

## 2021-08-29 DIAGNOSIS — Z79899 Other long term (current) drug therapy: Secondary | ICD-10-CM

## 2021-08-29 DIAGNOSIS — Z87891 Personal history of nicotine dependence: Secondary | ICD-10-CM | POA: Diagnosis not present

## 2021-08-29 DIAGNOSIS — Z90710 Acquired absence of both cervix and uterus: Secondary | ICD-10-CM

## 2021-08-29 DIAGNOSIS — S2242XA Multiple fractures of ribs, left side, initial encounter for closed fracture: Secondary | ICD-10-CM | POA: Diagnosis present

## 2021-08-29 DIAGNOSIS — R079 Chest pain, unspecified: Secondary | ICD-10-CM | POA: Diagnosis not present

## 2021-08-29 DIAGNOSIS — Z972 Presence of dental prosthetic device (complete) (partial): Secondary | ICD-10-CM | POA: Diagnosis not present

## 2021-08-29 DIAGNOSIS — I872 Venous insufficiency (chronic) (peripheral): Secondary | ICD-10-CM | POA: Diagnosis present

## 2021-08-29 DIAGNOSIS — Z7951 Long term (current) use of inhaled steroids: Secondary | ICD-10-CM

## 2021-08-29 DIAGNOSIS — I739 Peripheral vascular disease, unspecified: Secondary | ICD-10-CM | POA: Diagnosis not present

## 2021-08-29 DIAGNOSIS — Z91040 Latex allergy status: Secondary | ICD-10-CM

## 2021-08-29 DIAGNOSIS — M1611 Unilateral primary osteoarthritis, right hip: Secondary | ICD-10-CM | POA: Diagnosis not present

## 2021-08-29 DIAGNOSIS — J449 Chronic obstructive pulmonary disease, unspecified: Secondary | ICD-10-CM | POA: Diagnosis not present

## 2021-08-29 DIAGNOSIS — Z7982 Long term (current) use of aspirin: Secondary | ICD-10-CM

## 2021-08-29 DIAGNOSIS — G2581 Restless legs syndrome: Secondary | ICD-10-CM | POA: Diagnosis present

## 2021-08-29 DIAGNOSIS — M1990 Unspecified osteoarthritis, unspecified site: Secondary | ICD-10-CM | POA: Diagnosis present

## 2021-08-29 DIAGNOSIS — G40909 Epilepsy, unspecified, not intractable, without status epilepticus: Secondary | ICD-10-CM

## 2021-08-29 DIAGNOSIS — S72142D Displaced intertrochanteric fracture of left femur, subsequent encounter for closed fracture with routine healing: Secondary | ICD-10-CM | POA: Diagnosis not present

## 2021-08-29 DIAGNOSIS — Y92009 Unspecified place in unspecified non-institutional (private) residence as the place of occurrence of the external cause: Secondary | ICD-10-CM | POA: Diagnosis not present

## 2021-08-29 DIAGNOSIS — S72002A Fracture of unspecified part of neck of left femur, initial encounter for closed fracture: Secondary | ICD-10-CM | POA: Diagnosis present

## 2021-08-29 DIAGNOSIS — W010XXA Fall on same level from slipping, tripping and stumbling without subsequent striking against object, initial encounter: Secondary | ICD-10-CM | POA: Diagnosis present

## 2021-08-29 DIAGNOSIS — M2550 Pain in unspecified joint: Secondary | ICD-10-CM | POA: Diagnosis not present

## 2021-08-29 DIAGNOSIS — Z882 Allergy status to sulfonamides status: Secondary | ICD-10-CM

## 2021-08-29 DIAGNOSIS — E876 Hypokalemia: Secondary | ICD-10-CM | POA: Diagnosis not present

## 2021-08-29 DIAGNOSIS — M25552 Pain in left hip: Secondary | ICD-10-CM | POA: Diagnosis not present

## 2021-08-29 DIAGNOSIS — D62 Acute posthemorrhagic anemia: Secondary | ICD-10-CM | POA: Diagnosis not present

## 2021-08-29 DIAGNOSIS — S82032D Displaced transverse fracture of left patella, subsequent encounter for closed fracture with routine healing: Secondary | ICD-10-CM | POA: Diagnosis not present

## 2021-08-29 DIAGNOSIS — I2510 Atherosclerotic heart disease of native coronary artery without angina pectoris: Secondary | ICD-10-CM | POA: Diagnosis not present

## 2021-08-29 DIAGNOSIS — Z85828 Personal history of other malignant neoplasm of skin: Secondary | ICD-10-CM

## 2021-08-29 DIAGNOSIS — E785 Hyperlipidemia, unspecified: Secondary | ICD-10-CM | POA: Diagnosis present

## 2021-08-29 DIAGNOSIS — I10 Essential (primary) hypertension: Secondary | ICD-10-CM | POA: Diagnosis present

## 2021-08-29 DIAGNOSIS — Z7401 Bed confinement status: Secondary | ICD-10-CM | POA: Diagnosis not present

## 2021-08-29 DIAGNOSIS — J431 Panlobular emphysema: Secondary | ICD-10-CM | POA: Diagnosis not present

## 2021-08-29 DIAGNOSIS — Z8249 Family history of ischemic heart disease and other diseases of the circulatory system: Secondary | ICD-10-CM

## 2021-08-29 DIAGNOSIS — G250 Essential tremor: Secondary | ICD-10-CM | POA: Diagnosis present

## 2021-08-29 DIAGNOSIS — F0390 Unspecified dementia without behavioral disturbance: Secondary | ICD-10-CM | POA: Diagnosis present

## 2021-08-29 DIAGNOSIS — I440 Atrioventricular block, first degree: Secondary | ICD-10-CM | POA: Diagnosis not present

## 2021-08-29 DIAGNOSIS — Z888 Allergy status to other drugs, medicaments and biological substances status: Secondary | ICD-10-CM

## 2021-08-29 DIAGNOSIS — J9811 Atelectasis: Secondary | ICD-10-CM | POA: Diagnosis not present

## 2021-08-29 DIAGNOSIS — Z66 Do not resuscitate: Secondary | ICD-10-CM | POA: Diagnosis present

## 2021-08-29 DIAGNOSIS — Z881 Allergy status to other antibiotic agents status: Secondary | ICD-10-CM

## 2021-08-29 DIAGNOSIS — S72352A Displaced comminuted fracture of shaft of left femur, initial encounter for closed fracture: Secondary | ICD-10-CM | POA: Diagnosis not present

## 2021-08-29 DIAGNOSIS — S72142A Displaced intertrochanteric fracture of left femur, initial encounter for closed fracture: Principal | ICD-10-CM | POA: Diagnosis present

## 2021-08-29 DIAGNOSIS — K219 Gastro-esophageal reflux disease without esophagitis: Secondary | ICD-10-CM | POA: Diagnosis not present

## 2021-08-29 DIAGNOSIS — Z96612 Presence of left artificial shoulder joint: Secondary | ICD-10-CM | POA: Diagnosis present

## 2021-08-29 DIAGNOSIS — E782 Mixed hyperlipidemia: Secondary | ICD-10-CM | POA: Diagnosis not present

## 2021-08-29 LAB — CBC WITH DIFFERENTIAL/PLATELET
Abs Immature Granulocytes: 0.05 10*3/uL (ref 0.00–0.07)
Basophils Absolute: 0 10*3/uL (ref 0.0–0.1)
Basophils Relative: 1 %
Eosinophils Absolute: 0.1 10*3/uL (ref 0.0–0.5)
Eosinophils Relative: 3 %
HCT: 39.9 % (ref 36.0–46.0)
Hemoglobin: 12.6 g/dL (ref 12.0–15.0)
Immature Granulocytes: 1 %
Lymphocytes Relative: 21 %
Lymphs Abs: 0.8 10*3/uL (ref 0.7–4.0)
MCH: 26.6 pg (ref 26.0–34.0)
MCHC: 31.6 g/dL (ref 30.0–36.0)
MCV: 84.2 fL (ref 80.0–100.0)
Monocytes Absolute: 0.6 10*3/uL (ref 0.1–1.0)
Monocytes Relative: 14 %
Neutro Abs: 2.3 10*3/uL (ref 1.7–7.7)
Neutrophils Relative %: 60 %
Platelets: 218 10*3/uL (ref 150–400)
RBC: 4.74 MIL/uL (ref 3.87–5.11)
RDW: 15.6 % — ABNORMAL HIGH (ref 11.5–15.5)
WBC: 3.9 10*3/uL — ABNORMAL LOW (ref 4.0–10.5)
nRBC: 0 % (ref 0.0–0.2)

## 2021-08-29 LAB — BASIC METABOLIC PANEL
Anion gap: 7 (ref 5–15)
BUN: 24 mg/dL — ABNORMAL HIGH (ref 8–23)
CO2: 27 mmol/L (ref 22–32)
Calcium: 9.5 mg/dL (ref 8.9–10.3)
Chloride: 107 mmol/L (ref 98–111)
Creatinine, Ser: 0.9 mg/dL (ref 0.44–1.00)
GFR, Estimated: >60 mL/min (ref >60)
Glucose, Bld: 94 mg/dL (ref 70–99)
Potassium: 3.7 mmol/L (ref 3.5–5.1)
Sodium: 141 mmol/L (ref 135–145)

## 2021-08-29 LAB — SURGICAL PCR SCREEN
MRSA, PCR: NEGATIVE
Staphylococcus aureus: NEGATIVE

## 2021-08-29 LAB — TYPE AND SCREEN
ABO/RH(D): O POS
Antibody Screen: NEGATIVE

## 2021-08-29 LAB — PROTIME-INR
INR: 1 (ref 0.8–1.2)
Prothrombin Time: 12.9 seconds (ref 11.4–15.2)

## 2021-08-29 LAB — ABO/RH: ABO/RH(D): O POS

## 2021-08-29 MED ORDER — HYDROCODONE-ACETAMINOPHEN 5-325 MG PO TABS
1.0000 | ORAL_TABLET | Freq: Four times a day (QID) | ORAL | Status: DC | PRN
  Administered 2021-08-29 – 2021-08-30 (×3): 1 via ORAL
  Filled 2021-08-29: qty 2
  Filled 2021-08-29 (×3): qty 1

## 2021-08-29 MED ORDER — MEMANTINE HCL 10 MG PO TABS
10.0000 mg | ORAL_TABLET | Freq: Two times a day (BID) | ORAL | Status: DC
  Administered 2021-08-29 – 2021-08-30 (×2): 10 mg via ORAL
  Filled 2021-08-29 (×2): qty 1

## 2021-08-29 MED ORDER — MORPHINE SULFATE (PF) 2 MG/ML IV SOLN
2.0000 mg | INTRAVENOUS | Status: DC | PRN
  Administered 2021-08-29: 2 mg via INTRAVENOUS
  Filled 2021-08-29: qty 1

## 2021-08-29 MED ORDER — ACETAMINOPHEN 500 MG PO TABS
1000.0000 mg | ORAL_TABLET | Freq: Once | ORAL | Status: AC
  Administered 2021-08-30: 1000 mg via ORAL
  Filled 2021-08-29: qty 2

## 2021-08-29 MED ORDER — FUROSEMIDE 20 MG PO TABS
20.0000 mg | ORAL_TABLET | Freq: Every day | ORAL | Status: DC
  Administered 2021-08-30 – 2021-09-08 (×10): 20 mg via ORAL
  Filled 2021-08-29 (×10): qty 1

## 2021-08-29 MED ORDER — UMECLIDINIUM BROMIDE 62.5 MCG/ACT IN AEPB
1.0000 | INHALATION_SPRAY | Freq: Every day | RESPIRATORY_TRACT | Status: DC
  Administered 2021-08-30 – 2021-09-07 (×8): 1 via RESPIRATORY_TRACT
  Filled 2021-08-29 (×2): qty 7

## 2021-08-29 MED ORDER — LEVETIRACETAM 500 MG PO TABS
500.0000 mg | ORAL_TABLET | Freq: Every day | ORAL | Status: DC
  Administered 2021-08-29 – 2021-09-07 (×10): 500 mg via ORAL
  Filled 2021-08-29 (×10): qty 1

## 2021-08-29 MED ORDER — ONDANSETRON HCL 4 MG/2ML IJ SOLN
4.0000 mg | Freq: Once | INTRAMUSCULAR | Status: AC
  Administered 2021-08-29: 4 mg via INTRAVENOUS
  Filled 2021-08-29: qty 2

## 2021-08-29 MED ORDER — HYDROMORPHONE HCL 1 MG/ML IJ SOLN
0.5000 mg | INTRAMUSCULAR | Status: AC | PRN
  Administered 2021-08-29 (×2): 0.5 mg via INTRAVENOUS
  Filled 2021-08-29 (×2): qty 1

## 2021-08-29 MED ORDER — POTASSIUM CHLORIDE CRYS ER 10 MEQ PO TBCR
20.0000 meq | EXTENDED_RELEASE_TABLET | Freq: Every day | ORAL | Status: DC
  Administered 2021-08-29 – 2021-09-08 (×11): 20 meq via ORAL
  Filled 2021-08-29 (×11): qty 2

## 2021-08-29 MED ORDER — ALBUTEROL SULFATE (2.5 MG/3ML) 0.083% IN NEBU: 3.0000 mL | INHALATION_SOLUTION | Freq: Every day | RESPIRATORY_TRACT | Status: DC | PRN

## 2021-08-29 MED ORDER — CEFAZOLIN SODIUM-DEXTROSE 2-4 GM/100ML-% IV SOLN
2.0000 g | INTRAVENOUS | Status: AC
  Administered 2021-08-30: 2 g via INTRAVENOUS
  Filled 2021-08-29: qty 100

## 2021-08-29 MED ORDER — VITAMIN D 25 MCG (1000 UNIT) PO TABS
1000.0000 [IU] | ORAL_TABLET | Freq: Every day | ORAL | Status: DC
  Administered 2021-08-29 – 2021-09-08 (×11): 1000 [IU] via ORAL
  Filled 2021-08-29 (×10): qty 1

## 2021-08-29 MED ORDER — LAMOTRIGINE 25 MG PO TABS
150.0000 mg | ORAL_TABLET | Freq: Two times a day (BID) | ORAL | Status: DC
  Administered 2021-08-29 – 2021-09-08 (×20): 150 mg via ORAL
  Filled 2021-08-29 (×21): qty 2

## 2021-08-29 MED ORDER — PROCHLORPERAZINE EDISYLATE 10 MG/2ML IJ SOLN
10.0000 mg | Freq: Four times a day (QID) | INTRAMUSCULAR | Status: DC | PRN
  Administered 2021-08-29: 10 mg via INTRAVENOUS
  Filled 2021-08-29: qty 2

## 2021-08-29 MED ORDER — SODIUM CHLORIDE 0.9 % IV SOLN: INTRAVENOUS | Status: DC

## 2021-08-29 MED ORDER — POVIDONE-IODINE 10 % EX SWAB
2.0000 "application " | Freq: Once | CUTANEOUS | Status: AC
  Administered 2021-08-30: 2 via TOPICAL

## 2021-08-29 MED ORDER — HYDRALAZINE HCL 20 MG/ML IJ SOLN: 10.0000 mg | Freq: Three times a day (TID) | INTRAMUSCULAR | Status: DC | PRN

## 2021-08-29 MED ORDER — SIMVASTATIN 20 MG PO TABS
10.0000 mg | ORAL_TABLET | Freq: Every day | ORAL | Status: DC
  Administered 2021-08-29 – 2021-09-07 (×10): 10 mg via ORAL
  Filled 2021-08-29 (×10): qty 1

## 2021-08-29 MED ORDER — LEVETIRACETAM 250 MG PO TABS
250.0000 mg | ORAL_TABLET | Freq: Every day | ORAL | Status: DC
  Administered 2021-08-30 – 2021-09-08 (×10): 250 mg via ORAL
  Filled 2021-08-29 (×11): qty 1

## 2021-08-29 MED ORDER — AMLODIPINE BESYLATE 5 MG PO TABS
2.5000 mg | ORAL_TABLET | Freq: Every day | ORAL | Status: DC
  Administered 2021-08-30 – 2021-09-08 (×10): 2.5 mg via ORAL
  Filled 2021-08-29 (×10): qty 1

## 2021-08-29 MED ORDER — PRAMIPEXOLE DIHYDROCHLORIDE 0.25 MG PO TABS
0.5000 mg | ORAL_TABLET | Freq: Every day | ORAL | Status: DC
  Administered 2021-08-29 – 2021-09-07 (×10): 0.5 mg via ORAL
  Filled 2021-08-29 (×10): qty 2

## 2021-08-29 MED ORDER — CHLORHEXIDINE GLUCONATE 4 % EX LIQD
60.0000 mL | Freq: Once | CUTANEOUS | Status: AC
  Administered 2021-08-30: 4 via TOPICAL

## 2021-08-29 MED ORDER — PANTOPRAZOLE SODIUM 40 MG PO TBEC
40.0000 mg | DELAYED_RELEASE_TABLET | Freq: Every day | ORAL | Status: DC
  Administered 2021-08-29 – 2021-09-08 (×11): 40 mg via ORAL
  Filled 2021-08-29 (×11): qty 1

## 2021-08-29 MED ORDER — ASPIRIN 81 MG PO CHEW
81.0000 mg | CHEWABLE_TABLET | Freq: Every day | ORAL | Status: DC
  Administered 2021-08-31 – 2021-09-08 (×9): 81 mg via ORAL
  Filled 2021-08-29 (×9): qty 1

## 2021-08-29 NOTE — H&P (Signed)
History and Physical    Patient: [name]:[id number] DOB: [birth year] DOA: 08/29/2021 DOS: the patient was seen and examined on 08/29/2021 PCP: [name], MD  Patient coming from: SNF  Chief Complaint:  Chief Complaint  Patient presents with   Fall   HPI: [name] is a 88 y.o. female with medical history significant of HTN, PAD, COPD, panlobular emphysema, seizures, dementia. Presenting with hip pain after a fall. She reports that she was trying to walk to her living room to get to her recliner. She was using her walker, but became unbalanced and slid into a table. She then fell to the ground. She didn't hit her head. There was no LOC. There was no chest pain, palpitations, lightheadedness/dizziness prior to her fall. She was unable to get up on her own. She called out for help. The staff called for EMS. She denies any other aggravating or alleviating factors.   Review of Systems: As mentioned in the history of present illness. All other systems reviewed and are negative. Past Medical History:  Diagnosis Date   Acute bronchitis 05/23/2011   Acute upper respiratory infections of unspecified site 05/23/2011   Arthritis    Chronic airway obstruction, not elsewhere classified 05/23/2011   Disturbance of salivary secretion 01/31/2011   Dizziness and giddiness 01/31/2011   Dyspnea    Essential tremor 04/25/2014   External hemorrhoids without mention of complication 37/07/8887   Gait disorder 04/25/2014   Insomnia, unspecified 09/12/2011   Lumbago 01/31/2011   Major depressive disorder, single episode, unspecified 01/31/2011   Memory disorder 04/25/2014   Mitral valve disorders(424.0) 01/31/2011   Other and unspecified hyperlipidemia 01/31/2011   Other convulsions 01/31/2011   Other emphysema ([place]) 01/31/2011   Pain in joint, site unspecified 01/31/2011   Restless legs syndrome (RLS) 09/12/2011   Retinal detachment with retinal defect of right eye 2011   right eye twice    Seizure disorder ([place])    Senile osteoporosis 01/31/2011   Spontaneous ecchymoses 01/31/2011   Stiffness of joints, not elsewhere classified, multiple sites 01/31/2011   Unspecified essential hypertension 01/31/2011   Past Surgical History:  Procedure Laterality Date   ABDOMINAL HYSTERECTOMY  06/21/2003   TAH/BSO, omenectomy PSB resect, Stg IC cystadenofibroma   CHOLECYSTECTOMY  2005   Dr. Marlou Starks   ELBOW SURGERY Right 2008   broken   Dr. Apolonio Schneiders   EYE SURGERY     ORIF PATELLA Left 05/02/2020   Procedure: OPEN REDUCTION INTERNAL (ORIF) FIXATION LEFT PATELLA WITH MEDIAL AND LATERAL LIGAMENT REINFORCEMENTS;  Surgeon: [name], MD;  Location: [facility];  Service: Orthopedics;  Laterality: Left;   ORIF PATELLA Left 05/30/2020   Procedure: OPEN REDUCTION INTERNAL (ORIF) FIXATION PATELLA;  Surgeon: [name], MD;  Location: [facility];  Service: Orthopedics;  Laterality: Left;   RETINAL DETACHMENT SURGERY N/A    two   REVERSE SHOULDER ARTHROPLASTY Left 05/06/2019   Procedure: REVERSE SHOULDER ARTHROPLASTY;  Surgeon: Justice Britain, MD;  Location: [facility];  Service: Orthopedics;  Laterality: Left;  150mn   ROTATOR CUFF REPAIR Right 2012   Dr. CTheda Sers  SQUAMOUS CELL CARCINOMA EXCISION Bilateral 2012, 8/14   [place] on legs   Dr. GSarajane Jews  TONSILLECTOMY  1941   VIDEO BRONCHOSCOPY WITH ENDOBRONCHIAL NAVIGATION N/A 11/29/2015   Procedure: VIDEO BRONCHOSCOPY WITH ENDOBRONCHIAL NAVIGATION;  Surgeon: RCollene Gobble MD;  Location: [facility]  Service: Thoracic;  Laterality: N/A;   Social History:  reports that she quit smoking  about 33 years ago. Her smoking use included cigarettes. She has a 40.00 pack-year smoking history. She has never used smokeless tobacco. She reports that she does not drink alcohol and does not use drugs.  Allergies  Allergen Reactions   Vioxx [Rofecoxib] Shortness Of Breath   Dyazide [Hydrochlorothiazide W-Triamterene] Other (See Comments)    Lowers blood pressure too much    Latex Other (See Comments)    Swelling   Sulfa Antibiotics Nausea And Vomiting   Sumycin [Tetracycline]     Can't take due to drug reaction    Family History  Problem Relation Age of Onset   Heart disease Father        CHF   Cancer Mother        breast   Seizures Sister     Prior to Admission medications   Medication Sig Start Date End Date Taking? Authorizing Provider  acetaminophen (TYLENOL) 500 MG tablet Take 1,000 mg by mouth every 6 (six) hours as needed (pain).   Yes [provider]  Albuterol Sulfate (PROAIR RESPICLICK) 299 (90 Base) MCG/ACT AEPB Inhale 2 puffs into the lungs daily as needed.   Yes [provider]  amLODipine (NORVASC) 2.5 MG tablet Take 2.5 mg by mouth daily.   Yes [provider]  aspirin 81 MG chewable tablet Chew 81 mg by mouth daily.   Yes [provider]  chlorhexidine (PERIDEX) 0.12 % solution Use as directed 15 mLs in the mouth or throat at bedtime.   Yes [provider]  cholecalciferol (VITAMIN D) 1000 units tablet Take 1 tablet (1,000 Units total) by mouth daily. 11/29/15  Yes Collene Gobble, MD  Emollient (CERAVE) CREA Apply 1 application topically daily. Mix cream with Triamcinolone   Yes [provider]  furosemide (LASIX) 20 MG tablet Take 20 mg by mouth daily. 05/26/19  Yes [provider]  ketoconazole (NIZORAL) 2 % cream Apply 1 application. topically daily as needed (psoriasis). 09/20/15  Yes [provider]  ketoconazole (NIZORAL) 2 % shampoo Apply 1 application. topically See admin instructions. Monday, Thursday   Yes [provider]  lamoTRIgine (LAMICTAL) 150 MG tablet Take 150 mg by mouth 2 (two) times daily.   Yes [provider]  levETIRAcetam (KEPPRA) 250 MG tablet Take 250 mg by mouth daily. In the morning   Yes [provider]  levETIRAcetam (KEPPRA) 500 MG tablet Take 500 mg by mouth at bedtime.   Yes [provider]   loperamide (IMODIUM A-D) 2 MG tablet Take 4 mg by mouth See admin instructions. 4 mg for 1st dose, then 2 mg after each loose stool for 48 hours. Not to exceed 16 mg in 24 hours.   Yes [provider]  memantine (NAMENDA) 10 MG tablet TAKE 1 TABLET BY MOUTH TWICE DAILY. Patient taking differently: Take 10 mg by mouth 2 (two) times daily. 01/13/19  Yes Suzzanne Cloud, NP  oxyCODONE (OXY IR/ROXICODONE) 5 MG immediate release tablet Take 1 tablet (5 mg total) by mouth every 6 (six) hours as needed for severe pain. Patient taking differently: Take 5 mg by mouth every 6 (six) hours as needed (pain). 09/11/20  Yes Mast, Man X, NP  pantoprazole (PROTONIX) 40 MG tablet Take 40 mg by mouth daily.   Yes [provider]  Potassium Chloride ER 20 MEQ TBCR Take 20 mEq by mouth daily. 05/26/19  Yes [provider]  pramipexole (MIRAPEX) 0.25 MG tablet Take 0.5 mg by mouth at bedtime.  Yes [provider]  simvastatin (ZOCOR) 10 MG tablet Take 10 mg by mouth at bedtime.   Yes [provider]  tiotropium (SPIRIVA HANDIHALER) 18 MCG inhalation capsule INHALE CONTENTS OF ONE CAPSULE ONCE DAILY FOR COPD. Patient taking differently: Place 18 mcg into inhaler and inhale daily. 10/02/17  Yes Brand Males, MD  triamcinolone (KENALOG) 0.1 % Apply 1 application. topically daily as needed (psoriasis).   Yes [provider]  hydrocortisone 2.5 % cream Apply 1 application. topically 2 (two) times daily as needed (psoriasis). Patient not taking: Reported on 08/29/2021    [provider]  hydrocortisone cream 1 % Apply 1 application. topically 2 (two) times daily as needed (psoriasis). Patient not taking: Reported on 08/29/2021    [provider]  nystatin (MYCOSTATIN/NYSTOP) powder Apply 1 application. topically 2 (two) times daily as needed (Apply to urogenital area). Patient not taking: Reported on 08/29/2021    [provider]   sennosides-docusate sodium (SENOKOT-S) 8.6-50 MG tablet Take 2 tablets by mouth at bedtime. Patient not taking: Reported on 08/29/2021    [provider]    Physical Exam: Vitals:   08/29/21 0845 08/29/21 0935 08/29/21 1000 08/29/21 1100  BP: (!) 176/97 (!) 156/91 138/77 134/78  Pulse: 76 82 77 77  Resp: '18 18 14 12  '$ Temp:      SpO2: 94% (!) 83% 97% 97%   General: 88 y.o. female resting in bed in NAD Eyes: PERRL, normal sclera ENMT: Nares patent w/o discharge, orophaynx clear, dentition normal, ears w/o discharge/lesions/ulcers Neck: Supple, trachea midline Cardiovascular: RRR, +S1, S2, no m/g/r, equal pulses throughout Respiratory: CTABL, no w/r/r, normal WOB GI: BS+, NDNT, no masses noted, no organomegaly noted MSK: No e/c/c; limited L hip ROM d/t pain Neuro: A&O x 3, no focal deficits Psyc: Appropriate interaction and affect, calm/cooperative  Data Reviewed:  Na+  141 Scr  0.90 WBC  3.9  XR Left hip Acute, mildly displaced left intertrochanteric femur fracture. Possible lucent lesion within the right superior pubic body, recommend pelvis CT. Chronic right superior and inferior pubic rami fractures.  CXR Possible nondisplaced fractures involving the anterior left 8 and ninth ribs. Chronically elevated left hemidiaphragm with left basilar subsegmental atelectasis.  Sclerotic appearance of the right humeral head possibly reflecting AVN.  EKG: sinus, no st elevation; 1st degree AV block  Assessment and Plan: Fall Left Hip fracture     - admit to inpt, med-surg     - orthopedics to see; per EDP, ortho says she might be able to get on schedule for OR tomorrow     - can have diet today, make NPOpMN     - pain control     - PT/OT after surgery  Possible rib fractures     - as in imaging above; she denies pain at this time, monitor  HTN     - continue home regimen when confirmed  Seizures     - continue home regimen when confirmed  GERD     -  PPI  Panlobular emphysema     - not on supplemental O2     - continue home regimen when confirmed  Restless legs     - continue home regimen when confirmed  HLD     - continue home regimen when confirmed  Dementia     - continue home regimen when confirmed  1st degree heart block     - prolonged PR interval going back to at least 2016; stable  Advance Care Planning:   Code Status: DNR  Consults: EDP spoke with Ortho (Dr. Lillia Corporal)  Family Communication: None at bedside  Severity of Illness: The appropriate patient status for this patient is INPATIENT. Inpatient status is judged to be reasonable and necessary in order to provide the required intensity of service to ensure the patient's safety. The patient's presenting symptoms, physical exam findings, and initial radiographic and laboratory data in the context of their chronic comorbidities is felt to place them at high risk for further clinical deterioration. Furthermore, it is not anticipated that the patient will be medically stable for discharge from the hospital within 2 midnights of admission.   * I certify that at the point of admission it is my clinical judgment that the patient will require inpatient hospital care spanning beyond 2 midnights from the point of admission due to high intensity of service, high risk for further deterioration and high frequency of surveillance required.*  Author: Jonnie Finner, DO 08/29/2021 11:25 AM  For on call review [email or web address].

## 2021-08-29 NOTE — Consult Note (Signed)
ORTHOPAEDIC CONSULTATION  REQUESTING PHYSICIAN: Jonnie Finner, DO  PCP:  [name], MD  Chief Complaint: Left hip pain after fall  HPI: [name] is a 88 y.o. female with medical history significant of HTN, PAD, COPD, panlobular emphysema, seizures, dementia. She presented to the ED via EMS with left hip pain after a mechanical fall. She reports that she was trying to walk to her living room to get to her recliner. She was using her walker, but her foot slipped and she fell on her left side. She denies hitting her head, denies LOC. There was no chest pain, palpitations, lightheadedness/dizziness prior to her fall. She was unable to get up on her own. She called out for help. [facility]. She denies any tingling or numbness in LE bilaterally.   [facility]. She normally ambulates with a walker. Denies any history of DVT. Patient takes '81mg'$  aspirin daily.   Xrays and CT revealed a mildly displaced left intertrochanteric femur fracture. Ortho was consulted.     Past Medical History:  Diagnosis Date   Acute bronchitis 05/23/2011   Acute upper respiratory infections of unspecified site 05/23/2011   Arthritis    Chronic airway obstruction, not elsewhere classified 05/23/2011   Disturbance of salivary secretion 01/31/2011   Dizziness and giddiness 01/31/2011   Dyspnea    Essential tremor 04/25/2014   External hemorrhoids without mention of complication 06/27/2246   Gait disorder 04/25/2014   Insomnia, unspecified 09/12/2011   Lumbago 01/31/2011   Major depressive disorder, single episode, unspecified 01/31/2011   Memory disorder 04/25/2014   Mitral valve disorders(424.0) 01/31/2011   Other and unspecified hyperlipidemia 01/31/2011   Other convulsions 01/31/2011   Other emphysema ([place]) 01/31/2011   Pain in joint, site unspecified 01/31/2011   Restless legs syndrome (RLS) 09/12/2011   Retinal detachment with retinal defect of right  eye 2011   right eye twice   Seizure disorder ([place])    Senile osteoporosis 01/31/2011   Spontaneous ecchymoses 01/31/2011   Stiffness of joints, not elsewhere classified, multiple sites 01/31/2011   Unspecified essential hypertension 01/31/2011   Past Surgical History:  Procedure Laterality Date   ABDOMINAL HYSTERECTOMY  06/21/2003   TAH/BSO, omenectomy PSB resect, Stg IC cystadenofibroma   CHOLECYSTECTOMY  2005   Dr. Marlou Starks   ELBOW SURGERY Right 2008   broken   Dr. Apolonio Schneiders   EYE SURGERY     ORIF PATELLA Left 05/02/2020   Procedure: OPEN REDUCTION INTERNAL (ORIF) FIXATION LEFT PATELLA WITH MEDIAL AND LATERAL LIGAMENT REINFORCEMENTS;  Surgeon: [name], MD;  Location: [facility];  Service: Orthopedics;  Laterality: Left;   ORIF PATELLA Left 05/30/2020   Procedure: OPEN REDUCTION INTERNAL (ORIF) FIXATION PATELLA;  Surgeon: [name], MD;  Location: [facility];  Service: Orthopedics;  Laterality: Left;   RETINAL DETACHMENT SURGERY N/A    two   REVERSE SHOULDER ARTHROPLASTY Left 05/06/2019   Procedure: REVERSE SHOULDER ARTHROPLASTY;  Surgeon: Justice Britain, MD;  Location: [facility];  Service: Orthopedics;  Laterality: Left;  153mn   ROTATOR CUFF REPAIR Right 2012   Dr. CTheda Sers  SQUAMOUS CELL CARCINOMA EXCISION Bilateral 2012, 8/14   [place] on legs   Dr. 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  Tobacco Use   Smoking status: Former    Packs/day: 1.00    Years: 40.00    Pack years: 40.00    Types: Cigarettes     Quit date: 04/08/1988    Years since quitting: 33.4   Smokeless tobacco: Never   Tobacco comments:    quit in [place] Use   Vaping Use: Never used  Substance and Sexual Activity   Alcohol use: No    Alcohol/week: 0.0 standard drinks   Drug use: No   Sexual activity: Never  Other Topics Concern   Not on file  Social History Narrative   Lives at [place]   No children   Divorced   Exercise climbs steps 4 flights daily PT for balance   Alcohol none   Stopped smoking 1989   Patient drinks 3 large sodas daily.   Patient is right handed.   Social Determinants of Health   Financial Resource Strain: Not on file  Food Insecurity: Not on file  Transportation Needs: Not on file  Physical Activity: Not on file  Stress: Not on file  Social Connections: Not on file   Family History  Problem Relation Age of Onset   Heart disease Father        CHF   Cancer Mother        breast   Seizures Sister    Allergies  Allergen Reactions   Vioxx [Rofecoxib] Shortness Of Breath   Dyazide [Hydrochlorothiazide W-Triamterene] Other (See Comments)    Lowers blood pressure too much   Latex Other (See Comments)    Swelling   Sulfa Antibiotics Nausea And Vomiting   Sumycin [Tetracycline]     Can't take due to drug reaction   Prior to Admission medications   Medication Sig Start Date End Date Taking? Authorizing Provider  acetaminophen (TYLENOL) 500 MG tablet Take 1,000 mg by mouth every 6 (six) hours as needed (pain).   Yes [provider]  Albuterol Sulfate (PROAIR RESPICLICK) 681 (90 Base) MCG/ACT AEPB Inhale 2 puffs into the lungs daily as needed.   Yes [provider]  amLODipine (NORVASC) 2.5 MG tablet Take 2.5 mg by mouth daily.   Yes [provider]  aspirin 81 MG chewable tablet Chew 81 mg by mouth daily.   Yes [provider]  chlorhexidine (PERIDEX) 0.12 % solution Use as directed 15 mLs in the mouth or throat at bedtime.   Yes  [provider]  cholecalciferol (VITAMIN D) 1000 units tablet Take 1 tablet (1,000 Units total) by mouth daily. 11/29/15  Yes Collene Gobble, MD  Emollient (CERAVE) CREA Apply 1 application topically daily. Mix cream with Triamcinolone   Yes [provider]  furosemide (LASIX) 20 MG tablet Take 20 mg by mouth daily. 05/26/19  Yes [provider]  ketoconazole (NIZORAL) 2 % cream Apply 1 application. topically daily as needed (psoriasis). 09/20/15  Yes [provider]  ketoconazole (NIZORAL) 2 % shampoo Apply 1 application. topically See admin instructions. Monday, Thursday   Yes [provider]  lamoTRIgine (LAMICTAL) 150 MG tablet Take 150 mg by mouth 2 (two) times daily.   Yes [provider]  levETIRAcetam (KEPPRA) 250 MG tablet Take 250 mg by mouth daily. In the morning   Yes [provider]  levETIRAcetam (KEPPRA) 500 MG tablet Take 500 mg by mouth at bedtime.   Yes [provider]  loperamide (IMODIUM A-D) 2 MG tablet Take 4 mg by mouth See admin instructions. 4 mg for 1st dose, then 2 mg after each loose stool for 48 hours. Not to exceed 16 mg in 24 hours.   Yes [provider]  memantine (NAMENDA) 10 MG tablet TAKE 1 TABLET BY MOUTH TWICE DAILY. Patient taking differently: Take 10 mg by mouth 2 (two) times daily. 01/13/19  Yes Suzzanne Cloud, NP  oxyCODONE (OXY IR/ROXICODONE) 5 MG immediate release tablet Take 1 tablet (5 mg total) by mouth every 6 (six) hours as needed for severe pain. Patient taking differently: Take 5 mg by mouth every 6 (six) hours as needed (pain). 09/11/20  Yes Mast, Man X, NP  pantoprazole (PROTONIX) 40 MG tablet Take 40 mg by mouth daily.   Yes [provider]  Potassium Chloride ER 20 MEQ TBCR Take 20 mEq by mouth daily. 05/26/19  Yes [provider]  pramipexole (MIRAPEX) 0.25 MG tablet Take 0.5 mg by mouth at bedtime.   Yes [provider]  simvastatin (ZOCOR) 10  MG tablet Take 10 mg by mouth at bedtime.   Yes [provider]  tiotropium (SPIRIVA HANDIHALER) 18 MCG inhalation capsule INHALE CONTENTS OF ONE CAPSULE ONCE DAILY FOR COPD. Patient taking differently: Place 18 mcg into inhaler and inhale daily. 10/02/17  Yes Brand Males, MD  triamcinolone (KENALOG) 0.1 % Apply 1 application. topically daily as needed (psoriasis).   Yes [provider]  hydrocortisone 2.5 % cream Apply 1 application. topically 2 (two) times daily as needed (psoriasis). Patient not taking: Reported on 08/29/2021    [provider]  hydrocortisone cream 1 % Apply 1 application. topically 2 (two) times daily as needed (psoriasis). Patient not taking: Reported on 08/29/2021    [provider]  nystatin (MYCOSTATIN/NYSTOP) powder Apply 1 application. topically 2 (two) times daily as needed (Apply to urogenital area). Patient not taking: Reported on 08/29/2021    [provider]  sennosides-docusate sodium (SENOKOT-S) 8.6-50 MG tablet Take 2 tablets by mouth at bedtime. Patient not taking: Reported on 08/29/2021    [provider]   DG Chest 1 View  Result Date: 08/29/2021 CLINICAL DATA:  fall, pain EXAM: CHEST  1 VIEW COMPARISON:  05/10/2019 FINDINGS: Chronically elevated left hemidiaphragm with left basilar atelectasis. Cardiomediastinal silhouette is unchanged. No large pleural effusion. No visible pneumothorax. Left reverse total shoulder arthroplasty. Right glenohumeral osteoarthritis with appearance of a sclerotic humeral head, possibly reflecting AVN. Possible anterior left lower rib fractures involving ribs 8 and 9. Dextroconvex lower thoracic/upper lumbar curvature. IMPRESSION: Possible nondisplaced fractures involving the anterior left 8 and ninth ribs. Chronically elevated left hemidiaphragm with left basilar subsegmental atelectasis. Sclerotic appearance of the right humeral head possibly reflecting AVN. Electronically  Signed   By: Maurine Simmering M.D.   On: 08/29/2021 09:34   CT HIP LEFT WO CONTRAST  Result Date: 08/29/2021 CLINICAL DATA:  Unwitnessed fall. Hip surgical planning. Left hip pain and shortening and rotation. CT planning for surgery. EXAM: CT OF THE LEFT HIP WITHOUT CONTRAST TECHNIQUE: Multidetector CT imaging of the left hip was performed according to the standard protocol. Multiplanar CT image reconstructions were also generated. RADIATION DOSE REDUCTION: This exam was performed according to the departmental dose-optimization program  which includes automated exposure control, adjustment of the mA and/or kV according to patient size and/or use of iterative reconstruction technique. COMPARISON:  Left hip radiographs 08/29/2021; CT abdomen and pelvis 10/20/2015 FINDINGS: Bones/Joint/Cartilage Mild pubic symphysis joint space narrowing and peripheral degenerative spurring. No cortical erosion or suspicious bone marrow lesion is seen, with attention to the superomedial aspect of the right pubic body questioned on radiograph. The relative lucency may be due to overlying bowel gas within the rectum on that radiograph. There is a comminuted acute fracture of the distal femoral neck and intertrochanteric proximal left femur. There is mild posterior angulation of the intervening fracture fragments (axial series 2, image 47). External rotation of the greater trochanter region of the proximal femur. Multiple fracture lines extend through the greater trochanter. Mild-to-moderate superior impaction of the distal fracture component. Mild-to-moderate varus angulation. Nondisplaced fracture line extends through the anterior aspect of the lesser trochanter (coronal series 6, image 48). Mild-to-moderate left femoroacetabular joint space narrowing and peripheral osteophytosis. Partial visualization of old healed right inferior pubic ramus fracture. Ligaments Suboptimally assessed by CT. Muscles and Tendons Normal density and size of  the regional musculature. Soft tissues Mild diverticulosis at the junction of the distal descending colon and proximal sigmoid colon. There is a wide-mouth partially visualized midline ventral hernia of the pelvis seen diffusely. At the inferior aspect, there is a component that is smaller in transverse dimension and contains mesenteric fat and nondistended loops of small bowel, measuring up to 4.8 x 4.1 cm (transverse by AP, as measured on axial series 2, image 50). There also appears to be a wide mouth hernia lateral to the rectus abdominis musculature within the anterior left pelvis containing mesenteric fat and nondistended loops of small bowel. The uterus appears to be surgically absent. IMPRESSION: 1. Acute, comminuted, mildly displaced, mild to moderately impacted, and mildly varus angulated proximal left femoral fracture of the junction of the distal femoral neck and intertrochanteric region. 2. Mild pubic symphysis osteoarthritis. No cortical erosion or suspicious bone marrow lesion is seen. 3. Old healed right inferior pubic ramus fracture. 4. Midline and left paramidline ventral abdominal and pelvic hernias containing mesenteric fat and nondistended loops of small bowel. Electronically Signed   By: Yvonne Kendall M.D.   On: 08/29/2021 11:43   DG Knee Left Port  Result Date: 08/29/2021 CLINICAL DATA:  Acute left hip fracture.  Pain. EXAM: PORTABLE LEFT KNEE - 1-2 VIEW COMPARISON:  Left knee radiographs 04/26/2020 FINDINGS: There are 2 new screws from inferior approach fixating the previously seen transverse fracture of the mid height of the patella. There is now normal alignment of the fracture components with resolution of the prior diastasis. Moderate patellofemoral joint space narrowing and peripheral osteophytosis. Mild-to-moderate medial and lateral compartment joint space narrowing and peripheral osteophytosis. No acute fracture or dislocation. No joint effusion. Resolution of the prior  prepatellar soft tissue swelling/hematoma. IMPRESSION: Interval ORIF of the prior transverse fracture of the midpole of the patella with resolution of the prior bone distraction. Electronically Signed   By: Yvonne Kendall M.D.   On: 08/29/2021 11:45   DG Hip Unilat With Pelvis 2-3 Views Left  Result Date: 08/29/2021 CLINICAL DATA:  fall, pain EXAM: DG HIP (WITH OR WITHOUT PELVIS) 2-3V LEFT COMPARISON:  CT 10/20/2015 FINDINGS: There is an acute, mildly displaced left-sided intertrochanteric fracture. There are chronic right superior and inferior pubic rami fractures. There is an indistinct appearance of the superior cortex of the right pubic body with adjacent osseous  lucency. Mild, left greater than right hip osteoarthritis. Mild degenerative changes of the sacroiliac joints. IMPRESSION: Acute, mildly displaced left intertrochanteric femur fracture. Possible lucent lesion within the right superior pubic body, recommend pelvis CT. Chronic right superior and inferior pubic rami fractures. Electronically Signed   By: Maurine Simmering M.D.   On: 08/29/2021 09:29    Positive ROS: All other systems have been reviewed and were otherwise negative with the exception of those mentioned in the HPI and as above.  Physical Exam: General: Alert, no acute distress Cardiovascular: No pedal edema Respiratory: No cyanosis, no use of accessory musculature GI: No organomegaly, abdomen is soft and non-tender Skin: No lesions in the area of chief complaint Neurologic: Sensation intact distally Psychiatric: Patient is competent for consent with normal mood and affect Lymphatic: No axillary or cervical lymphadenopathy  MUSCULOSKELETAL: Left Lower Extremity: No skin wounds, lesions or erythema. LLE shortened and externally rotated. Pain with movement of left leg.   Sensory and motor function intact in LE bilaterally. Palpable pedal pulses bilaterally.   Vascular changes noted in lower legs likely from chronic venous  insufficiency.    Assessment: Displaced left intertrochanteric femur fracture  Plan: Patient has an unstable left intertrochanteric femur fracture. She will require surgical treatment for pain control and allow immediate mobilization out of bed. TRH consulted for admission, perioperative medical optimization. Plan for left hip intermedullary nail. Discussed R/B/A. See risk statement. Plan for surgery tomorrow. NPO after midnight. Hold chemical DVT prophylaxis. All questions solicited and answered.    The risks, benefits, and alternatives were discussed with the patient. There are risks associated with the surgery including, but not limited to, problems with anesthesia (death), infection, differences in leg length/angulation/rotation, fracture of bones, loosening or failure of implants, malunion, nonunion, hematoma (blood accumulation) which may require surgical drainage, blood clots, pulmonary embolism, nerve injury (foot drop), and blood vessel injury. The patient understands these risks and elects to proceed.  Charlott Rakes, PA-C    08/29/2021 2:08 PM

## 2021-08-29 NOTE — ED Notes (Signed)
MD at bedside updating pt

## 2021-08-29 NOTE — ED Triage Notes (Signed)
[facility] bib [facility] with unwitnessed fall. Per EMS, staff did hear pt fall. Pt with pain to L hip. +shortening and rotation to L leg, arrives in pelvic sling. Denies hitting head or LOC. Given 84mg fentanyl en route.

## 2021-08-29 NOTE — ED Notes (Signed)
Placed pt on purewick  

## 2021-08-29 NOTE — ED Notes (Signed)
Pt noted to desat to 78% after administration of dilaudid. Placed pt on 3L [place] with improvement in oxygen saturation to 94%. Will cont to monitor.

## 2021-08-29 NOTE — ED Provider Notes (Signed)
[facility] DEPT Provider Note   CSN: [id number] Arrival date & time: 08/29/21  0827     History {Add pertinent medical, surgical, social history, OB history to HPI:1} Chief Complaint  Patient presents with  . Fall    [name] is a 88 y.o. female.   Fall   Patient has history of COPD, depression, hypertension, restless leg syndrome, insomnia, osteoporosis, arthritis, seizure disorder who presents to the ED for evaluation after fall.  Patient states she was using her walker when her foot slipped and she fell landing on her left side.  Patient is having severe pain in her left hip and also noticed that her leg is angulated.  Patient was not able to stand.  Staff at her nursing facility called EMS.  Patient was placed in a pelvic sling and given 50 mcg of fentanyl.  Patient denies any other injuries.  No headache or loss of consciousness.  No neck or back pain.  Home Medications Prior to Admission medications   Medication Sig Start Date End Date Taking? Authorizing Provider  acetaminophen (TYLENOL) 500 MG tablet Take 1,000 mg by mouth every 6 (six) hours as needed (pain).   Yes [provider]  Albuterol Sulfate (PROAIR RESPICLICK) 440 (90 Base) MCG/ACT AEPB Inhale 2 puffs into the lungs daily as needed.   Yes [provider]  amLODipine (NORVASC) 2.5 MG tablet Take 2.5 mg by mouth daily.   Yes [provider]  aspirin 81 MG chewable tablet Chew 81 mg by mouth daily.   Yes [provider]  chlorhexidine (PERIDEX) 0.12 % solution Use as directed 15 mLs in the mouth or throat at bedtime.   Yes [provider]  cholecalciferol (VITAMIN D) 1000 units tablet Take 1 tablet (1,000 Units total) by mouth daily. 11/29/15  Yes Collene Gobble, MD  Emollient (CERAVE) CREA Apply 1 application topically daily. Mix cream with Triamcinolone   Yes [provider]  furosemide (LASIX) 20 MG tablet Take 20 mg by mouth  daily. 05/26/19  Yes [provider]  ketoconazole (NIZORAL) 2 % cream Apply 1 application. topically daily as needed (psoriasis). 09/20/15  Yes [provider]  ketoconazole (NIZORAL) 2 % shampoo Apply 1 application. topically See admin instructions. Monday, Thursday   Yes [provider]  lamoTRIgine (LAMICTAL) 150 MG tablet Take 150 mg by mouth 2 (two) times daily.   Yes [provider]  levETIRAcetam (KEPPRA) 250 MG tablet Take 250 mg by mouth daily. In the morning   Yes [provider]  levETIRAcetam (KEPPRA) 500 MG tablet Take 500 mg by mouth at bedtime.   Yes [provider]  loperamide (IMODIUM A-D) 2 MG tablet Take 4 mg by mouth See admin instructions. 4 mg for 1st dose, then 2 mg after each loose stool for 48 hours. Not to exceed 16 mg in 24 hours.   Yes [provider]  memantine (NAMENDA) 10 MG tablet TAKE 1 TABLET BY MOUTH TWICE DAILY. Patient taking differently: Take 10 mg by mouth 2 (two) times daily. 01/13/19  Yes Suzzanne Cloud, NP  oxyCODONE (OXY IR/ROXICODONE) 5 MG immediate release tablet Take 1 tablet (5 mg total) by mouth every 6 (six) hours as needed for severe pain. Patient taking differently: Take 5 mg by mouth every 6 (six) hours as needed (pain). 09/11/20  Yes Mast, Man X, NP  pantoprazole (PROTONIX) 40 MG tablet Take 40 mg by mouth daily.   Yes [provider]  Potassium Chloride ER 20 MEQ TBCR Take 20 mEq by mouth daily. 05/26/19  Yes [provider]  pramipexole (MIRAPEX) 0.25 MG tablet Take 0.5 mg by mouth at bedtime.   Yes [provider]  simvastatin (ZOCOR) 10 MG tablet Take 10 mg by mouth at bedtime.   Yes [provider]  tiotropium (SPIRIVA HANDIHALER) 18 MCG inhalation capsule INHALE CONTENTS OF ONE CAPSULE ONCE DAILY FOR COPD. Patient taking differently: Place 18 mcg into inhaler and inhale daily. 10/02/17  Yes Brand Males, MD  triamcinolone (KENALOG) 0.1 % Apply 1  application. topically daily as needed (psoriasis).   Yes [provider]  hydrocortisone 2.5 % cream Apply 1 application. topically 2 (two) times daily as needed (psoriasis). Patient not taking: Reported on 08/29/2021    [provider]  hydrocortisone cream 1 % Apply 1 application. topically 2 (two) times daily as needed (psoriasis). Patient not taking: Reported on 08/29/2021    [provider]  nystatin (MYCOSTATIN/NYSTOP) powder Apply 1 application. topically 2 (two) times daily as needed (Apply to urogenital area). Patient not taking: Reported on 08/29/2021    [provider]  sennosides-docusate sodium (SENOKOT-S) 8.6-50 MG tablet Take 2 tablets by mouth at bedtime. Patient not taking: Reported on 08/29/2021    [provider]      Allergies    Vioxx [rofecoxib], Dyazide [hydrochlorothiazide w-triamterene], Latex, Sulfa antibiotics, and Sumycin [tetracycline]    Review of Systems   Review of Systems  Constitutional:  Negative for fever.   Physical Exam Updated Vital Signs BP 134/78   Pulse 77   Temp 98.5 F (36.9 C)   Resp 12   LMP 02/16/1975   SpO2 97%  Physical Exam Vitals and nursing note reviewed.  Constitutional:      General: She is not in acute distress.    Appearance: She is well-developed.  HENT:     Head: Normocephalic and atraumatic.     Right Ear: External ear normal.     Left Ear: External ear normal.  Eyes:     General: No scleral icterus.       Right eye: No discharge.        Left eye: No discharge.     Conjunctiva/sclera: Conjunctivae normal.  Neck:     Trachea: No tracheal deviation.  Cardiovascular:     Rate and Rhythm: Normal rate and regular rhythm.  Pulmonary:     Effort: Pulmonary effort is normal. No respiratory distress.     Breath sounds: Normal breath sounds. No stridor. No wheezing or rales.  Abdominal:     General: Bowel sounds are normal. There is no distension.     Palpations: Abdomen is  soft.     Tenderness: There is no abdominal tenderness. There is no guarding or rebound.  Musculoskeletal:        General: Tenderness present. No deformity.     Cervical back: Normal and neck supple.     Thoracic back: Normal.     Lumbar back: Normal.     Left hip: Tenderness present.     Comments: Shortening and internal rotation of the left lower extremity, distal neurovascular intact  Skin:    General: Skin is warm and dry.     Findings: No rash.  Neurological:     General: No focal deficit present.     Mental Status: She is alert.     Cranial Nerves: No cranial nerve deficit (no facial droop, extraocular movements intact, no slurred speech).  Sensory: No sensory deficit.     Motor: No abnormal muscle tone or seizure activity.     Coordination: Coordination normal.  Psychiatric:        Mood and Affect: Mood normal.    ED Results / Procedures / Treatments   Labs (all labs ordered are listed, but only abnormal results are displayed) Labs Reviewed  BASIC METABOLIC PANEL - Abnormal; Notable for the following components:      Result Value   BUN 24 (*)    All other components within normal limits  CBC WITH DIFFERENTIAL/PLATELET - Abnormal; Notable for the following components:   WBC 3.9 (*)    RDW 15.6 (*)    All other components within normal limits  PROTIME-INR  TYPE AND SCREEN  ABO/RH    EKG EKG Interpretation  Date/Time:  Wednesday Aug 29 2021 09:28:49 EDT Ventricular Rate:  82 PR Interval:  302 QRS Duration: 96 QT Interval:  407 QTC Calculation: 476 R Axis:   -70 Text Interpretation: Sinus rhythm Prolonged PR interval Left anterior fascicular block Abnormal R-wave progression, late transition No significant change since last tracing Confirmed by Dorie Rank [phone]) on 08/29/2021 9:33:34 AM  Radiology DG Chest 1 View  Result Date: 08/29/2021 CLINICAL DATA:  fall, pain EXAM: CHEST  1 VIEW COMPARISON:  05/10/2019 FINDINGS: Chronically elevated left hemidiaphragm  with left basilar atelectasis. Cardiomediastinal silhouette is unchanged. No large pleural effusion. No visible pneumothorax. Left reverse total shoulder arthroplasty. Right glenohumeral osteoarthritis with appearance of a sclerotic humeral head, possibly reflecting AVN. Possible anterior left lower rib fractures involving ribs 8 and 9. Dextroconvex lower thoracic/upper lumbar curvature. IMPRESSION: Possible nondisplaced fractures involving the anterior left 8 and ninth ribs. Chronically elevated left hemidiaphragm with left basilar subsegmental atelectasis. Sclerotic appearance of the right humeral head possibly reflecting AVN. Electronically Signed   By: Maurine Simmering M.D.   On: 08/29/2021 09:34   DG Hip Unilat With Pelvis 2-3 Views Left  Result Date: 08/29/2021 CLINICAL DATA:  fall, pain EXAM: DG HIP (WITH OR WITHOUT PELVIS) 2-3V LEFT COMPARISON:  CT 10/20/2015 FINDINGS: There is an acute, mildly displaced left-sided intertrochanteric fracture. There are chronic right superior and inferior pubic rami fractures. There is an indistinct appearance of the superior cortex of the right pubic body with adjacent osseous lucency. Mild, left greater than right hip osteoarthritis. Mild degenerative changes of the sacroiliac joints. IMPRESSION: Acute, mildly displaced left intertrochanteric femur fracture. Possible lucent lesion within the right superior pubic body, recommend pelvis CT. Chronic right superior and inferior pubic rami fractures. Electronically Signed   By: Maurine Simmering M.D.   On: 08/29/2021 09:29    Procedures Procedures  {Document cardiac monitor, telemetry assessment procedure when appropriate:1}  Medications Ordered in ED Medications  0.9 %  sodium chloride infusion ( Intravenous New Bag/Given 08/29/21 0929)  HYDROmorphone (DILAUDID) injection 0.5 mg (0.5 mg Intravenous Given 08/29/21 0930)  ondansetron (ZOFRAN) injection 4 mg (4 mg Intravenous Given 08/29/21 4481)    ED Course/ Medical Decision  Making/ A&P Clinical Course as of 08/29/21 1136  Wed Aug 29, 2021  1000 DG Hip Unilat With Pelvis 2-3 Views Left Hip images and radiology report reviewed.  Intertrochanteric hip fracture noted.  Possible lucency within the pelvis [JK]  1010 CBC with Differential(!) Normal [JK]  8563 Basic metabolic panel(!) Normal [JK]  1011 DG Chest 1 View Chest x-ray suggest possible rib fractures.   [JK]  1053 Case discussed with Dr. Lyla Glassing.  Plan on OR tomorrow [JK]  1130 Case discussed with Dr. Marylyn Ishihara regarding admission [JK]    Clinical Course User Index [JK] Dorie Rank, MD                           Medical Decision Making Problems Addressed: Closed displaced intertrochanteric fracture of left femur, initial encounter [facility]): acute illness or injury that poses a threat to life or bodily functions Closed fracture of multiple ribs of left side, initial encounter: acute illness or injury that poses a threat to life or bodily functions  Amount and/or Complexity of Data Reviewed Labs: ordered. Decision-making details documented in ED Course. Radiology: ordered and independent interpretation performed. Decision-making details documented in ED Course.  Risk Prescription drug management. Decision regarding hospitalization. Risk Details: Case discussed with orthopedic surgery and the medical service   Patient presented to the ED for evaluation after a fall.  Appears to be a mechanical fall as patient states she slipped.  Unfortunately patient sustained a fracture of the left intertrochanteric hip region.  Patient will require operative intervention.  Possible rib fractures also noted although patient does not complain of any chest pain at this time.  ED work-up otherwise reassuring.  Plan on admission to the hospital for further treatment and evaluation.  {Document critical care time when appropriate:1} {Document review of labs and clinical decision tools ie heart score, Chads2Vasc2 etc:1}  {Document  your independent review of radiology images, and any outside records:1} {Document your discussion with family members, caretakers, and with consultants:1} {Document social determinants of health affecting pt's care:1} {Document your decision making why or why not admission, treatments were needed:1} Final Clinical Impression(s) / ED Diagnoses Final diagnoses:  Closed displaced intertrochanteric fracture of left femur, initial encounter ([place])  Closed fracture of multiple ribs of left side, initial encounter    Rx / DC Orders ED Discharge Orders     None

## 2021-08-29 NOTE — H&P (View-Only) (Signed)
ORTHOPAEDIC CONSULTATION  REQUESTING PHYSICIAN: Jonnie Finner, DO  PCP:  [name], MD  Chief Complaint: Left hip pain after fall  HPI: [name] is a 88 y.o. female with medical history significant of HTN, PAD, COPD, panlobular emphysema, seizures, dementia. She presented to the ED via EMS with left hip pain after a mechanical fall. She reports that she was trying to walk to her living room to get to her recliner. She was using her walker, but her foot slipped and she fell on her left side. She denies hitting her head, denies LOC. There was no chest pain, palpitations, lightheadedness/dizziness prior to her fall. She was unable to get up on her own. She called out for help. [facility]. She denies any tingling or numbness in LE bilaterally.   [facility] She normally ambulates with a walker. Denies any history of DVT. Patient takes '81mg'$  aspirin daily.   Xrays and CT revealed a mildly displaced left intertrochanteric femur fracture. Ortho was consulted.     Past Medical History:  Diagnosis Date   Acute bronchitis 05/23/2011   Acute upper respiratory infections of unspecified site 05/23/2011   Arthritis    Chronic airway obstruction, not elsewhere classified 05/23/2011   Disturbance of salivary secretion 01/31/2011   Dizziness and giddiness 01/31/2011   Dyspnea    Essential tremor 04/25/2014   External hemorrhoids without mention of complication 97/35/3299   Gait disorder 04/25/2014   Insomnia, unspecified 09/12/2011   Lumbago 01/31/2011   Major depressive disorder, single episode, unspecified 01/31/2011   Memory disorder 04/25/2014   Mitral valve disorders(424.0) 01/31/2011   Other and unspecified hyperlipidemia 01/31/2011   Other convulsions 01/31/2011   Other emphysema ([place]) 01/31/2011   Pain in joint, site unspecified 01/31/2011   Restless legs syndrome (RLS) 09/12/2011   Retinal detachment with retinal defect of right  eye 2011   right eye twice   Seizure disorder ([place])    Senile osteoporosis 01/31/2011   Spontaneous ecchymoses 01/31/2011   Stiffness of joints, not elsewhere classified, multiple sites 01/31/2011   Unspecified essential hypertension 01/31/2011   Past Surgical History:  Procedure Laterality Date   ABDOMINAL HYSTERECTOMY  06/21/2003   TAH/BSO, omenectomy PSB resect, Stg IC cystadenofibroma   CHOLECYSTECTOMY  2005   Dr. Marlou Starks   ELBOW SURGERY Right 2008   broken   Dr. Apolonio Schneiders   EYE SURGERY     ORIF PATELLA Left 05/02/2020   Procedure: OPEN REDUCTION INTERNAL (ORIF) FIXATION LEFT PATELLA WITH MEDIAL AND LATERAL LIGAMENT REINFORCEMENTS;  Surgeon: [name], MD;  Location: [facility];  Service: Orthopedics;  Laterality: Left;   ORIF PATELLA Left 05/30/2020   Procedure: OPEN REDUCTION INTERNAL (ORIF) FIXATION PATELLA;  Surgeon: [name], MD;  Location: [facility];  Service: Orthopedics;  Laterality: Left;   RETINAL DETACHMENT SURGERY N/A    two   REVERSE SHOULDER ARTHROPLASTY Left 05/06/2019   Procedure: REVERSE SHOULDER ARTHROPLASTY;  Surgeon: Justice Britain, MD;  Location: [facility];  Service: Orthopedics;  Laterality: Left;  113mn   ROTATOR CUFF REPAIR Right 2012   Dr. CTheda Sers  SQUAMOUS CELL CARCINOMA EXCISION Bilateral 2012, 8/14   [place] on legs   Dr. 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  Tobacco Use   Smoking status: Former    Packs/day: 1.00    Years: 40.00    Pack years: 40.00    Types: Cigarettes     Quit date: 04/08/1988    Years since quitting: 33.4   Smokeless tobacco: Never   Tobacco comments:    quit in [place] Use   Vaping Use: Never used  Substance and Sexual Activity   Alcohol use: No    Alcohol/week: 0.0 standard drinks   Drug use: No   Sexual activity: Never  Other Topics Concern   Not on file  Social History Narrative   Lives at [place]   No children   Divorced   Exercise climbs steps 4 flights daily PT for balance   Alcohol none   Stopped smoking 1989   Patient drinks 3 large sodas daily.   Patient is right handed.   Social Determinants of Health   Financial Resource Strain: Not on file  Food Insecurity: Not on file  Transportation Needs: Not on file  Physical Activity: Not on file  Stress: Not on file  Social Connections: Not on file   Family History  Problem Relation Age of Onset   Heart disease Father        CHF   Cancer Mother        breast   Seizures Sister    Allergies  Allergen Reactions   Vioxx [Rofecoxib] Shortness Of Breath   Dyazide [Hydrochlorothiazide W-Triamterene] Other (See Comments)    Lowers blood pressure too much   Latex Other (See Comments)    Swelling   Sulfa Antibiotics Nausea And Vomiting   Sumycin [Tetracycline]     Can't take due to drug reaction   Prior to Admission medications   Medication Sig Start Date End Date Taking? Authorizing Provider  acetaminophen (TYLENOL) 500 MG tablet Take 1,000 mg by mouth every 6 (six) hours as needed (pain).   Yes [provider]  Albuterol Sulfate (PROAIR RESPICLICK) 829 (90 Base) MCG/ACT AEPB Inhale 2 puffs into the lungs daily as needed.   Yes [provider]  amLODipine (NORVASC) 2.5 MG tablet Take 2.5 mg by mouth daily.   Yes [provider]  aspirin 81 MG chewable tablet Chew 81 mg by mouth daily.   Yes [provider]  chlorhexidine (PERIDEX) 0.12 % solution Use as directed 15 mLs in the mouth or throat at bedtime.   Yes  [provider]  cholecalciferol (VITAMIN D) 1000 units tablet Take 1 tablet (1,000 Units total) by mouth daily. 11/29/15  Yes Collene Gobble, MD  Emollient (CERAVE) CREA Apply 1 application topically daily. Mix cream with Triamcinolone   Yes [provider]  furosemide (LASIX) 20 MG tablet Take 20 mg by mouth daily. 05/26/19  Yes [provider]  ketoconazole (NIZORAL) 2 % cream Apply 1 application. topically daily as needed (psoriasis). 09/20/15  Yes [provider]  ketoconazole (NIZORAL) 2 % shampoo Apply 1 application. topically See admin instructions. Monday, Thursday   Yes [provider]  lamoTRIgine (LAMICTAL) 150 MG tablet Take 150 mg by mouth 2 (two) times daily.   Yes [provider]  levETIRAcetam (KEPPRA) 250 MG tablet Take 250 mg by mouth daily. In the morning   Yes [provider]  levETIRAcetam (KEPPRA) 500 MG tablet Take 500 mg by mouth at bedtime.   Yes [provider]  loperamide (IMODIUM A-D) 2 MG tablet Take 4 mg by mouth See admin instructions. 4 mg for 1st dose, then 2 mg after each loose stool for 48 hours. Not to exceed 16 mg in 24 hours.   Yes [provider]  memantine (NAMENDA) 10 MG tablet TAKE 1 TABLET BY MOUTH TWICE DAILY. Patient taking differently: Take 10 mg by mouth 2 (two) times daily. 01/13/19  Yes Suzzanne Cloud, NP  oxyCODONE (OXY IR/ROXICODONE) 5 MG immediate release tablet Take 1 tablet (5 mg total) by mouth every 6 (six) hours as needed for severe pain. Patient taking differently: Take 5 mg by mouth every 6 (six) hours as needed (pain). 09/11/20  Yes Mast, Man X, NP  pantoprazole (PROTONIX) 40 MG tablet Take 40 mg by mouth daily.   Yes [provider]  Potassium Chloride ER 20 MEQ TBCR Take 20 mEq by mouth daily. 05/26/19  Yes [provider]  pramipexole (MIRAPEX) 0.25 MG tablet Take 0.5 mg by mouth at bedtime.   Yes [provider]  simvastatin (ZOCOR) 10  MG tablet Take 10 mg by mouth at bedtime.   Yes [provider]  tiotropium (SPIRIVA HANDIHALER) 18 MCG inhalation capsule INHALE CONTENTS OF ONE CAPSULE ONCE DAILY FOR COPD. Patient taking differently: Place 18 mcg into inhaler and inhale daily. 10/02/17  Yes Brand Males, MD  triamcinolone (KENALOG) 0.1 % Apply 1 application. topically daily as needed (psoriasis).   Yes [provider]  hydrocortisone 2.5 % cream Apply 1 application. topically 2 (two) times daily as needed (psoriasis). Patient not taking: Reported on 08/29/2021    [provider]  hydrocortisone cream 1 % Apply 1 application. topically 2 (two) times daily as needed (psoriasis). Patient not taking: Reported on 08/29/2021    [provider]  nystatin (MYCOSTATIN/NYSTOP) powder Apply 1 application. topically 2 (two) times daily as needed (Apply to urogenital area). Patient not taking: Reported on 08/29/2021    [provider]  sennosides-docusate sodium (SENOKOT-S) 8.6-50 MG tablet Take 2 tablets by mouth at bedtime. Patient not taking: Reported on 08/29/2021    [provider]   DG Chest 1 View  Result Date: 08/29/2021 CLINICAL DATA:  fall, pain EXAM: CHEST  1 VIEW COMPARISON:  05/10/2019 FINDINGS: Chronically elevated left hemidiaphragm with left basilar atelectasis. Cardiomediastinal silhouette is unchanged. No large pleural effusion. No visible pneumothorax. Left reverse total shoulder arthroplasty. Right glenohumeral osteoarthritis with appearance of a sclerotic humeral head, possibly reflecting AVN. Possible anterior left lower rib fractures involving ribs 8 and 9. Dextroconvex lower thoracic/upper lumbar curvature. IMPRESSION: Possible nondisplaced fractures involving the anterior left 8 and ninth ribs. Chronically elevated left hemidiaphragm with left basilar subsegmental atelectasis. Sclerotic appearance of the right humeral head possibly reflecting AVN. Electronically  Signed   By: Maurine Simmering M.D.   On: 08/29/2021 09:34   CT HIP LEFT WO CONTRAST  Result Date: 08/29/2021 CLINICAL DATA:  Unwitnessed fall. Hip surgical planning. Left hip pain and shortening and rotation. CT planning for surgery. EXAM: CT OF THE LEFT HIP WITHOUT CONTRAST TECHNIQUE: Multidetector CT imaging of the left hip was performed according to the standard protocol. Multiplanar CT image reconstructions were also generated. RADIATION DOSE REDUCTION: This exam was performed according to the departmental dose-optimization program  which includes automated exposure control, adjustment of the mA and/or kV according to patient size and/or use of iterative reconstruction technique. COMPARISON:  Left hip radiographs 08/29/2021; CT abdomen and pelvis 10/20/2015 FINDINGS: Bones/Joint/Cartilage Mild pubic symphysis joint space narrowing and peripheral degenerative spurring. No cortical erosion or suspicious bone marrow lesion is seen, with attention to the superomedial aspect of the right pubic body questioned on radiograph. The relative lucency may be due to overlying bowel gas within the rectum on that radiograph. There is a comminuted acute fracture of the distal femoral neck and intertrochanteric proximal left femur. There is mild posterior angulation of the intervening fracture fragments (axial series 2, image 47). External rotation of the greater trochanter region of the proximal femur. Multiple fracture lines extend through the greater trochanter. Mild-to-moderate superior impaction of the distal fracture component. Mild-to-moderate varus angulation. Nondisplaced fracture line extends through the anterior aspect of the lesser trochanter (coronal series 6, image 48). Mild-to-moderate left femoroacetabular joint space narrowing and peripheral osteophytosis. Partial visualization of old healed right inferior pubic ramus fracture. Ligaments Suboptimally assessed by CT. Muscles and Tendons Normal density and size of  the regional musculature. Soft tissues Mild diverticulosis at the junction of the distal descending colon and proximal sigmoid colon. There is a wide-mouth partially visualized midline ventral hernia of the pelvis seen diffusely. At the inferior aspect, there is a component that is smaller in transverse dimension and contains mesenteric fat and nondistended loops of small bowel, measuring up to 4.8 x 4.1 cm (transverse by AP, as measured on axial series 2, image 50). There also appears to be a wide mouth hernia lateral to the rectus abdominis musculature within the anterior left pelvis containing mesenteric fat and nondistended loops of small bowel. The uterus appears to be surgically absent. IMPRESSION: 1. Acute, comminuted, mildly displaced, mild to moderately impacted, and mildly varus angulated proximal left femoral fracture of the junction of the distal femoral neck and intertrochanteric region. 2. Mild pubic symphysis osteoarthritis. No cortical erosion or suspicious bone marrow lesion is seen. 3. Old healed right inferior pubic ramus fracture. 4. Midline and left paramidline ventral abdominal and pelvic hernias containing mesenteric fat and nondistended loops of small bowel. Electronically Signed   By: Yvonne Kendall M.D.   On: 08/29/2021 11:43   DG Knee Left Port  Result Date: 08/29/2021 CLINICAL DATA:  Acute left hip fracture.  Pain. EXAM: PORTABLE LEFT KNEE - 1-2 VIEW COMPARISON:  Left knee radiographs 04/26/2020 FINDINGS: There are 2 new screws from inferior approach fixating the previously seen transverse fracture of the mid height of the patella. There is now normal alignment of the fracture components with resolution of the prior diastasis. Moderate patellofemoral joint space narrowing and peripheral osteophytosis. Mild-to-moderate medial and lateral compartment joint space narrowing and peripheral osteophytosis. No acute fracture or dislocation. No joint effusion. Resolution of the prior  prepatellar soft tissue swelling/hematoma. IMPRESSION: Interval ORIF of the prior transverse fracture of the midpole of the patella with resolution of the prior bone distraction. Electronically Signed   By: Yvonne Kendall M.D.   On: 08/29/2021 11:45   DG Hip Unilat With Pelvis 2-3 Views Left  Result Date: 08/29/2021 CLINICAL DATA:  fall, pain EXAM: DG HIP (WITH OR WITHOUT PELVIS) 2-3V LEFT COMPARISON:  CT 10/20/2015 FINDINGS: There is an acute, mildly displaced left-sided intertrochanteric fracture. There are chronic right superior and inferior pubic rami fractures. There is an indistinct appearance of the superior cortex of the right pubic body with adjacent osseous  lucency. Mild, left greater than right hip osteoarthritis. Mild degenerative changes of the sacroiliac joints. IMPRESSION: Acute, mildly displaced left intertrochanteric femur fracture. Possible lucent lesion within the right superior pubic body, recommend pelvis CT. Chronic right superior and inferior pubic rami fractures. Electronically Signed   By: Maurine Simmering M.D.   On: 08/29/2021 09:29    Positive ROS: All other systems have been reviewed and were otherwise negative with the exception of those mentioned in the HPI and as above.  Physical Exam: General: Alert, no acute distress Cardiovascular: No pedal edema Respiratory: No cyanosis, no use of accessory musculature GI: No organomegaly, abdomen is soft and non-tender Skin: No lesions in the area of chief complaint Neurologic: Sensation intact distally Psychiatric: Patient is competent for consent with normal mood and affect Lymphatic: No axillary or cervical lymphadenopathy  MUSCULOSKELETAL: Left Lower Extremity: No skin wounds, lesions or erythema. LLE shortened and externally rotated. Pain with movement of left leg.   Sensory and motor function intact in LE bilaterally. Palpable pedal pulses bilaterally.   Vascular changes noted in lower legs likely from chronic venous  insufficiency.    Assessment: Displaced left intertrochanteric femur fracture  Plan: Patient has an unstable left intertrochanteric femur fracture. She will require surgical treatment for pain control and allow immediate mobilization out of bed. TRH consulted for admission, perioperative medical optimization. Plan for left hip intermedullary nail. Discussed R/B/A. See risk statement. Plan for surgery tomorrow. NPO after midnight. Hold chemical DVT prophylaxis. All questions solicited and answered.    The risks, benefits, and alternatives were discussed with the patient. There are risks associated with the surgery including, but not limited to, problems with anesthesia (death), infection, differences in leg length/angulation/rotation, fracture of bones, loosening or failure of implants, malunion, nonunion, hematoma (blood accumulation) which may require surgical drainage, blood clots, pulmonary embolism, nerve injury (foot drop), and blood vessel injury. The patient understands these risks and elects to proceed.  Charlott Rakes, PA-C    08/29/2021 2:08 PM

## 2021-08-29 NOTE — Plan of Care (Signed)
  Problem: Clinical Measurements: Goal: Ability to maintain clinical measurements within normal limits will improve Outcome: Progressing   Problem: Activity: Goal: Risk for activity intolerance will decrease Outcome: Progressing   Problem: Pain Managment: Goal: General experience of comfort will improve Outcome: Progressing   Problem: Safety: Goal: Ability to remain free from injury will improve Outcome: Progressing   

## 2021-08-30 ENCOUNTER — Inpatient Hospital Stay (HOSPITAL_COMMUNITY): Payer: Medicare PPO | Admitting: Anesthesiology

## 2021-08-30 ENCOUNTER — Inpatient Hospital Stay (HOSPITAL_COMMUNITY): Payer: Medicare PPO

## 2021-08-30 ENCOUNTER — Other Ambulatory Visit: Payer: Self-pay

## 2021-08-30 ENCOUNTER — Encounter (HOSPITAL_COMMUNITY): Payer: Self-pay | Admitting: Internal Medicine

## 2021-08-30 ENCOUNTER — Encounter (HOSPITAL_COMMUNITY)
Admission: EM | Disposition: A | Discharge status: Skilled Nursing Facility | Payer: Self-pay | Source: Skilled Nursing Facility | Attending: Internal Medicine

## 2021-08-30 DIAGNOSIS — Y92009 Unspecified place in unspecified non-institutional (private) residence as the place of occurrence of the external cause: Secondary | ICD-10-CM

## 2021-08-30 DIAGNOSIS — J449 Chronic obstructive pulmonary disease, unspecified: Secondary | ICD-10-CM

## 2021-08-30 DIAGNOSIS — I10 Essential (primary) hypertension: Secondary | ICD-10-CM

## 2021-08-30 DIAGNOSIS — Z87891 Personal history of nicotine dependence: Secondary | ICD-10-CM

## 2021-08-30 DIAGNOSIS — J431 Panlobular emphysema: Secondary | ICD-10-CM

## 2021-08-30 DIAGNOSIS — S72142A Displaced intertrochanteric fracture of left femur, initial encounter for closed fracture: Secondary | ICD-10-CM

## 2021-08-30 DIAGNOSIS — W19XXXA Unspecified fall, initial encounter: Secondary | ICD-10-CM | POA: Diagnosis not present

## 2021-08-30 DIAGNOSIS — I2510 Atherosclerotic heart disease of native coronary artery without angina pectoris: Secondary | ICD-10-CM

## 2021-08-30 DIAGNOSIS — S72002A Fracture of unspecified part of neck of left femur, initial encounter for closed fracture: Secondary | ICD-10-CM | POA: Diagnosis not present

## 2021-08-30 HISTORY — PX: INTRAMEDULLARY (IM) NAIL INTERTROCHANTERIC: SHX5875

## 2021-08-30 LAB — CBC
HCT: 38.2 % (ref 36.0–46.0)
Hemoglobin: 11.5 g/dL — ABNORMAL LOW (ref 12.0–15.0)
MCH: 26.1 pg (ref 26.0–34.0)
MCHC: 30.1 g/dL (ref 30.0–36.0)
MCV: 86.6 fL (ref 80.0–100.0)
Platelets: 190 10*3/uL (ref 150–400)
RBC: 4.41 MIL/uL (ref 3.87–5.11)
RDW: 15.4 % (ref 11.5–15.5)
WBC: 6.4 10*3/uL (ref 4.0–10.5)
nRBC: 0 % (ref 0.0–0.2)

## 2021-08-30 LAB — BASIC METABOLIC PANEL
Anion gap: 4 — ABNORMAL LOW (ref 5–15)
BUN: 14 mg/dL (ref 8–23)
CO2: 26 mmol/L (ref 22–32)
Calcium: 8.7 mg/dL — ABNORMAL LOW (ref 8.9–10.3)
Chloride: 113 mmol/L — ABNORMAL HIGH (ref 98–111)
Creatinine, Ser: 0.88 mg/dL (ref 0.44–1.00)
GFR, Estimated: >60 mL/min (ref >60)
Glucose, Bld: 88 mg/dL (ref 70–99)
Potassium: 3.8 mmol/L (ref 3.5–5.1)
Sodium: 143 mmol/L (ref 135–145)

## 2021-08-30 SURGERY — FIXATION, FRACTURE, INTERTROCHANTERIC, WITH INTRAMEDULLARY ROD
Anesthesia: General | Laterality: Left

## 2021-08-30 MED ORDER — PHENYLEPHRINE HCL (PRESSORS) 10 MG/ML IV SOLN
INTRAVENOUS | Status: AC
  Filled 2021-08-30: qty 1

## 2021-08-30 MED ORDER — PHENYLEPHRINE HCL-NACL 20-0.9 MG/250ML-% IV SOLN
INTRAVENOUS | Status: DC | PRN
  Administered 2021-08-30: 40 ug/min via INTRAVENOUS

## 2021-08-30 MED ORDER — HYDROCODONE-ACETAMINOPHEN 5-325 MG PO TABS
1.0000 | ORAL_TABLET | ORAL | Status: DC | PRN
  Administered 2021-08-30: 1 via ORAL

## 2021-08-30 MED ORDER — ENSURE SURGERY PO LIQD
237.0000 mL | Freq: Two times a day (BID) | ORAL | Status: DC
  Administered 2021-08-30 – 2021-09-03 (×6): 237 mL via ORAL

## 2021-08-30 MED ORDER — METOCLOPRAMIDE HCL 5 MG PO TABS: 5.0000 mg | ORAL_TABLET | Freq: Three times a day (TID) | ORAL | Status: DC | PRN

## 2021-08-30 MED ORDER — ALBUMIN HUMAN 5 % IV SOLN
INTRAVENOUS | Status: DC | PRN
Start: 2021-08-30 — End: 2021-08-30

## 2021-08-30 MED ORDER — LACTATED RINGERS IV SOLN: INTRAVENOUS | Status: DC

## 2021-08-30 MED ORDER — METOCLOPRAMIDE HCL 5 MG/ML IJ SOLN: 5.0000 mg | Freq: Three times a day (TID) | INTRAMUSCULAR | Status: DC | PRN

## 2021-08-30 MED ORDER — CEFAZOLIN SODIUM-DEXTROSE 2-4 GM/100ML-% IV SOLN
2.0000 g | Freq: Four times a day (QID) | INTRAVENOUS | Status: AC
  Administered 2021-08-30 – 2021-08-31 (×2): 2 g via INTRAVENOUS
  Filled 2021-08-30 (×2): qty 100

## 2021-08-30 MED ORDER — ONDANSETRON HCL 4 MG/2ML IJ SOLN: 4.0000 mg | Freq: Four times a day (QID) | INTRAMUSCULAR | Status: DC | PRN

## 2021-08-30 MED ORDER — DEXAMETHASONE SODIUM PHOSPHATE 10 MG/ML IJ SOLN
INTRAMUSCULAR | Status: AC
  Filled 2021-08-30: qty 1

## 2021-08-30 MED ORDER — PHENOL 1.4 % MT LIQD: 1.0000 | OROMUCOSAL | Status: DC | PRN

## 2021-08-30 MED ORDER — ALBUMIN HUMAN 5 % IV SOLN
INTRAVENOUS | Status: AC
  Filled 2021-08-30: qty 250

## 2021-08-30 MED ORDER — ROCURONIUM BROMIDE 10 MG/ML (PF) SYRINGE
PREFILLED_SYRINGE | INTRAVENOUS | Status: DC | PRN
  Administered 2021-08-30: 50 mg via INTRAVENOUS

## 2021-08-30 MED ORDER — ONDANSETRON HCL 4 MG/2ML IJ SOLN
INTRAMUSCULAR | Status: DC | PRN
  Administered 2021-08-30: 4 mg via INTRAVENOUS

## 2021-08-30 MED ORDER — FENTANYL CITRATE (PF) 100 MCG/2ML IJ SOLN
INTRAMUSCULAR | Status: AC
  Filled 2021-08-30: qty 2

## 2021-08-30 MED ORDER — ENOXAPARIN SODIUM 30 MG/0.3ML IJ SOSY
30.0000 mg | PREFILLED_SYRINGE | INTRAMUSCULAR | Status: DC
  Administered 2021-08-31 – 2021-09-08 (×9): 30 mg via SUBCUTANEOUS
  Filled 2021-08-30 (×9): qty 0.3

## 2021-08-30 MED ORDER — ROCURONIUM BROMIDE 10 MG/ML (PF) SYRINGE
PREFILLED_SYRINGE | INTRAVENOUS | Status: AC
Start: 2021-08-30 — End: ?
  Filled 2021-08-30: qty 10

## 2021-08-30 MED ORDER — PROPOFOL 500 MG/50ML IV EMUL
INTRAVENOUS | Status: DC | PRN
  Administered 2021-08-30: 75 ug/kg/min via INTRAVENOUS

## 2021-08-30 MED ORDER — ONDANSETRON HCL 4 MG/2ML IJ SOLN: 4.0000 mg | Freq: Once | INTRAMUSCULAR | Status: DC | PRN

## 2021-08-30 MED ORDER — FENTANYL CITRATE PF 50 MCG/ML IJ SOSY: 25.0000 ug | PREFILLED_SYRINGE | INTRAMUSCULAR | Status: DC | PRN

## 2021-08-30 MED ORDER — OXYCODONE HCL 5 MG PO TABS: 5.0000 mg | ORAL_TABLET | Freq: Once | ORAL | Status: DC | PRN

## 2021-08-30 MED ORDER — TRANEXAMIC ACID-NACL 1000-0.7 MG/100ML-% IV SOLN
1000.0000 mg | Freq: Once | INTRAVENOUS | Status: AC
  Administered 2021-08-30: 1000 mg via INTRAVENOUS
  Filled 2021-08-30: qty 100

## 2021-08-30 MED ORDER — DEXAMETHASONE SODIUM PHOSPHATE 10 MG/ML IJ SOLN
INTRAMUSCULAR | Status: DC | PRN
  Administered 2021-08-30: 4 mg via INTRAVENOUS

## 2021-08-30 MED ORDER — MENTHOL 3 MG MT LOZG: 1.0000 | LOZENGE | OROMUCOSAL | Status: DC | PRN

## 2021-08-30 MED ORDER — OXYCODONE HCL 5 MG/5ML PO SOLN: 5.0000 mg | Freq: Once | ORAL | Status: DC | PRN

## 2021-08-30 MED ORDER — ISOPROPYL ALCOHOL 70 % SOLN
Status: DC | PRN
  Administered 2021-08-30: 1 via TOPICAL

## 2021-08-30 MED ORDER — MORPHINE SULFATE (PF) 2 MG/ML IV SOLN: 0.5000 mg | INTRAVENOUS | Status: DC | PRN

## 2021-08-30 MED ORDER — ACETAMINOPHEN 325 MG PO TABS: 325.0000 mg | ORAL_TABLET | Freq: Four times a day (QID) | ORAL | Status: DC | PRN

## 2021-08-30 MED ORDER — ONDANSETRON HCL 4 MG/2ML IJ SOLN
INTRAMUSCULAR | Status: AC
  Filled 2021-08-30: qty 2

## 2021-08-30 MED ORDER — ONDANSETRON HCL 4 MG PO TABS: 4.0000 mg | ORAL_TABLET | Freq: Four times a day (QID) | ORAL | Status: DC | PRN

## 2021-08-30 MED ORDER — FENTANYL CITRATE (PF) 100 MCG/2ML IJ SOLN
INTRAMUSCULAR | Status: DC | PRN
Start: 2021-08-30 — End: 2021-08-30
  Administered 2021-08-30: 75 ug via INTRAVENOUS
  Administered 2021-08-30: 25 ug via INTRAVENOUS

## 2021-08-30 MED ORDER — SUGAMMADEX SODIUM 200 MG/2ML IV SOLN
INTRAVENOUS | Status: DC | PRN
  Administered 2021-08-30: 200 mg via INTRAVENOUS

## 2021-08-30 MED ORDER — HYDROCODONE-ACETAMINOPHEN 7.5-325 MG PO TABS
1.0000 | ORAL_TABLET | ORAL | Status: DC | PRN
  Administered 2021-09-01 – 2021-09-02 (×3): 1 via ORAL
  Filled 2021-08-30 (×3): qty 1

## 2021-08-30 MED ORDER — SENNA 8.6 MG PO TABS
1.0000 | ORAL_TABLET | Freq: Two times a day (BID) | ORAL | Status: DC
  Administered 2021-08-30 – 2021-09-03 (×8): 8.6 mg via ORAL
  Filled 2021-08-30 (×10): qty 1

## 2021-08-30 MED ORDER — ACETAMINOPHEN 325 MG PO TABS: 650.0000 mg | ORAL_TABLET | Freq: Four times a day (QID) | ORAL | Status: DC | PRN

## 2021-08-30 MED ORDER — DOCUSATE SODIUM 100 MG PO CAPS
100.0000 mg | ORAL_CAPSULE | Freq: Two times a day (BID) | ORAL | Status: DC
  Administered 2021-08-30 – 2021-09-03 (×8): 100 mg via ORAL
  Filled 2021-08-30 (×10): qty 1

## 2021-08-30 MED ORDER — HYDROCODONE-ACETAMINOPHEN 5-325 MG PO TABS
1.0000 | ORAL_TABLET | ORAL | Status: DC | PRN
  Administered 2021-08-31 – 2021-09-03 (×5): 1 via ORAL
  Filled 2021-08-30 (×5): qty 1

## 2021-08-30 MED ORDER — ADULT MULTIVITAMIN W/MINERALS CH
1.0000 | ORAL_TABLET | Freq: Every day | ORAL | Status: DC
  Administered 2021-08-30 – 2021-09-08 (×9): 1 via ORAL
  Filled 2021-08-30 (×9): qty 1

## 2021-08-30 MED ORDER — LIDOCAINE 2% (20 MG/ML) 5 ML SYRINGE
INTRAMUSCULAR | Status: DC | PRN
  Administered 2021-08-30: 60 mg via INTRAVENOUS

## 2021-08-30 MED ORDER — PROPOFOL 10 MG/ML IV BOLUS
INTRAVENOUS | Status: DC | PRN
  Administered 2021-08-30: 70 mg via INTRAVENOUS

## 2021-08-30 MED ORDER — LIDOCAINE HCL (PF) 2 % IJ SOLN
INTRAMUSCULAR | Status: AC
  Filled 2021-08-30: qty 5

## 2021-08-30 MED ORDER — PHENYLEPHRINE 80 MCG/ML (10ML) SYRINGE FOR IV PUSH (FOR BLOOD PRESSURE SUPPORT)
PREFILLED_SYRINGE | INTRAVENOUS | Status: DC | PRN
  Administered 2021-08-30 (×2): 160 ug via INTRAVENOUS
  Administered 2021-08-30: 80 ug via INTRAVENOUS

## 2021-08-30 MED ORDER — PROPOFOL 10 MG/ML IV BOLUS
INTRAVENOUS | Status: AC
  Filled 2021-08-30: qty 20

## 2021-08-30 MED ORDER — SODIUM CHLORIDE 0.9 % IR SOLN
Status: DC | PRN
  Administered 2021-08-30: 1000 mL

## 2021-08-30 SURGICAL SUPPLY — 51 items
ADH SKN CLS APL DERMABOND .7 (GAUZE/BANDAGES/DRESSINGS) ×2
APL PRP STRL LF DISP 70% ISPRP (MISCELLANEOUS) ×1
BAG COUNTER SPONGE SURGICOUNT (BAG) ×1 IMPLANT
BAG SPEC THK2 15X12 ZIP CLS (MISCELLANEOUS)
BAG SPNG CNTER NS LX DISP (BAG) ×1
BAG ZIPLOCK 12X15 (MISCELLANEOUS) IMPLANT
BIT DRILL CANN LG 4.3MM (BIT) IMPLANT
BIT DRILL LAG SCREW (DRILL) IMPLANT
CHLORAPREP W/TINT 26 (MISCELLANEOUS) ×2 IMPLANT
COVER PERINEAL POST (MISCELLANEOUS) ×2 IMPLANT
COVER SURGICAL LIGHT HANDLE (MISCELLANEOUS) ×2 IMPLANT
DERMABOND ADVANCED (GAUZE/BANDAGES/DRESSINGS) ×2
DERMABOND ADVANCED .7 DNX12 (GAUZE/BANDAGES/DRESSINGS) ×2 IMPLANT
DRAPE C-ARM 42X120 X-RAY (DRAPES) ×2 IMPLANT
DRAPE C-ARMOR (DRAPES) ×2 IMPLANT
DRAPE IMP U-DRAPE 54X76 (DRAPES) ×4 IMPLANT
DRAPE SHEET LG 3/4 BI-LAMINATE (DRAPES) ×4 IMPLANT
DRAPE STERI IOBAN 125X83 (DRAPES) ×2 IMPLANT
DRAPE U-SHAPE 47X51 STRL (DRAPES) ×4 IMPLANT
DRESSING MEPILEX FLEX 4X4 (GAUZE/BANDAGES/DRESSINGS) ×2 IMPLANT
DRILL BIT CANN LG 4.3MM (BIT) ×2
DRILL LAG SCREW (DRILL) ×2
DRSG MEPILEX BORDER 4X8 (GAUZE/BANDAGES/DRESSINGS) ×1 IMPLANT
DRSG MEPILEX FLEX 4X4 (GAUZE/BANDAGES/DRESSINGS) ×2
ELECT BLADE TIP CTD 4 INCH (ELECTRODE) ×1 IMPLANT
FACESHIELD WRAPAROUND (MASK) ×4 IMPLANT
FACESHIELD WRAPAROUND OR TEAM (MASK) ×2 IMPLANT
GAUZE SPONGE 4X4 12PLY STRL (GAUZE/BANDAGES/DRESSINGS) ×2 IMPLANT
GLOVE BIO SURGEON STRL SZ8.5 (GLOVE) ×4 IMPLANT
GLOVE BIOGEL M 7.0 STRL (GLOVE) ×2 IMPLANT
GLOVE BIOGEL PI IND STRL 7.5 (GLOVE) ×1 IMPLANT
GLOVE BIOGEL PI IND STRL 8 (GLOVE) ×1 IMPLANT
GLOVE BIOGEL PI IND STRL 8.5 (GLOVE) ×1 IMPLANT
GLOVE BIOGEL PI INDICATOR 7.5 (GLOVE) ×1
GLOVE BIOGEL PI INDICATOR 8 (GLOVE) ×1
GLOVE BIOGEL PI INDICATOR 8.5 (GLOVE) ×1
GLOVE SURG LX 7.5 STRW (GLOVE) ×2
GLOVE SURG LX STRL 7.5 STRW (GLOVE) ×2 IMPLANT
GOWN SPEC L3 XXLG W/TWL (GOWN DISPOSABLE) ×2 IMPLANT
GUIDEPIN VERSANAIL DSP 3.2X444 (ORTHOPEDIC DISPOSABLE SUPPLIES) ×1 IMPLANT
HFN 125 DEG 11MM X 180MM (Orthopedic Implant) ×1 IMPLANT
HIP FRA NAIL LAG SCREW 10.5X90 (Orthopedic Implant) ×2 IMPLANT
KIT BASIN OR (CUSTOM PROCEDURE TRAY) ×2 IMPLANT
MANIFOLD NEPTUNE II (INSTRUMENTS) ×2 IMPLANT
MARKER SKIN DUAL TIP RULER LAB (MISCELLANEOUS) ×2 IMPLANT
PACK GENERAL/GYN (CUSTOM PROCEDURE TRAY) ×2 IMPLANT
SCREW BONE CORTICAL 5.0X3 (Screw) ×1 IMPLANT
SCREW LAG HIP FRA NAIL 10.5X90 (Orthopedic Implant) IMPLANT
SUT MNCRL AB 3-0 PS2 18 (SUTURE) ×2 IMPLANT
SUT VIC AB 1 CT1 36 (SUTURE) ×2 IMPLANT
TOWEL OR 17X26 10 PK STRL BLUE (TOWEL DISPOSABLE) ×2 IMPLANT

## 2021-08-30 NOTE — TOC Initial Note (Signed)
Transition of Care [facility]) - Initial/Assessment Note   Patient Details  Name: [name] MRN: [id number] Date of Birth: 06-07-1932  [facility]) CM/SW Contact:    Sherie Don, LCSW Phone Number: 08/30/2021, 2:02 PM  Clinical Narrative: Patient is from [facility]). CSW spoke with patient's friend, Ellsworth Lennox, as patient was being prepped for surgery. Per Hoyle Sauer, patient is from the SNF at [facility] and the plan is for her to return. CSW confirmed with Katie in admissions at [facility] that patient came from the SNF and will return to her SNF bed for rehab after surgery. FL2 started; PASRR confirmed. CSW called [facility] to confirm patient is managed by [employer]. TOC to follow.  Expected Discharge Plan: Skilled Nursing Facility Barriers to Discharge: Continued Medical Work up, Ship broker  Patient Goals and CMS Choice Patient states their goals for this hospitalization and ongoing recovery are:: Return to [facility] SN[facility] CMS Medicare.gov Compare Post Acute Care list provided to:: Patient Choice offered to / list presented to : Patient  Expected Discharge Plan and Services Expected Discharge Plan: [place] In-house Referral: Clinical Social Work Post Acute Care Choice: [place] Living arrangements for the past 2 months: [place]            DME Arranged: N/A DME Agency: NA  Prior Living Arrangements/Services Living arrangements for the past 2 months: [place] Lives with:: Facility Resident Patient language and need for interpreter reviewed:: Yes Do you feel safe going back to the place where you live?: Yes      Need for Family Participation in Patient Care: Yes (Comment) Care giver support system in place?: Yes (comment) Criminal Activity/Legal Involvement Pertinent to Current Situation/Hospitalization: No - Comment as needed  Activities of Daily Living Home Assistive  Devices/Equipment: Eyeglasses, Environmental consultant (specify type) ADL Screening (condition at time of admission) Patient's cognitive ability adequate to safely complete daily activities?: Yes Is the patient deaf or have difficulty hearing?: No Does the patient have difficulty seeing, even when wearing glasses/contacts?: No Does the patient have difficulty concentrating, remembering, or making decisions?: No Patient able to express need for assistance with ADLs?: Yes Does the patient have difficulty dressing or bathing?: Yes Independently performs ADLs?: No Communication: Independent Dressing (OT): Needs assistance Is this a change from baseline?: Change from baseline, expected to last >3 days Grooming: Independent Feeding: Independent Bathing: Needs assistance Is this a change from baseline?: Change from baseline, expected to last >3 days Toileting: Needs assistance Is this a change from baseline?: Change from baseline, expected to last >3days In/Out Bed: Needs assistance Is this a change from baseline?: Change from baseline, expected to last >3 days Walks in Home: Dependent Is this a change from baseline?: Change from baseline, expected to last >3 days Does the patient have difficulty walking or climbing stairs?: Yes Weakness of Legs: Left (hx of broken left knee twice in the past) Weakness of Arms/Hands: None  Permission Sought/Granted Permission sought to share information with : Chartered certified accountant granted to share information with : Yes, Verbal Permission Granted Permission granted to share info w AGENCY: [name]  Emotional Assessment Orientation: : Oriented to Self, Oriented to  Time, Oriented to Place, Oriented to Situation Alcohol / Substance Use: Not Applicable Psych Involvement: No (comment)  Admission diagnosis:  Closed left hip fracture ([place]) [S72.002A] Closed displaced intertrochanteric fracture of left femur, initial encounter ([place])  [S72.142A] Closed fracture of multiple ribs of  left side, initial encounter [S22.42XA] Patient Active Problem List   Diagnosis Date Noted   Closed left hip fracture ([place]) 08/29/2021   Heart block AV first degree 08/29/2021   Skin irritation 07/12/2021   History of retinal detachment 02/20/2021   Fuchs' corneal dystrophy of both eyes 02/20/2021   Choroidal nevus of right eye 02/20/2021   Pseudophakia, both eyes 02/20/2021   Dry skin dermatitis 01/29/2021   COVID-19 virus infection 09/25/2020   GERD (gastroesophageal reflux disease) 04/28/2020   Nondisplaced transverse fracture of left patella, subsequent encounter for closed fracture with nonunion 04/28/2020   Generalized osteoarthritis of multiple sites 10/05/2019   Olecranon bursitis of right elbow 10/04/2019   PAD (peripheral artery disease) ([place]) 06/23/2019   Slow transit constipation 05/21/2019   Generalized weakness 05/19/2019   Vasovagal near syncope 05/19/2019   History of urinary retention 05/19/2019   Mandibular fracture, closed ([place]) 05/19/2019   Humerus fracture 05/04/2019   Right thyroid nodule 02/28/2016   Abnormal thyroid uptake 11/10/2015   Coronary artery calcification 12/05/2014   Panlobular emphysema ([place]) 07/25/2014   Dyspnea and respiratory abnormality 07/25/2014   Multiple lung nodules on CT 07/25/2014   Smoking history 07/25/2014   Pain of right lower leg 05/23/2014   Fall at home, initial encounter 05/23/2014   Gait disorder 04/25/2014   Senile dementia ([place]) 04/25/2014   Essential tremor 04/25/2014   Advanced directives, counseling/discussion 02/18/2013   Seizure disorder ([place]) 10/15/2012   Cancer of skin, squamous cell 10/15/2012   Unspecified vitamin D deficiency 10/15/2012   Hyperlipidemia 10/15/2012   Edema 09/10/2012   Hordeolum externum (stye) 09/10/2012   Restless legs syndrome (RLS) 09/12/2011   Insomnia, unspecified 09/12/2011   COPD (chronic obstructive pulmonary disease) ([place]) 05/23/2011    Essential hypertension 01/31/2011   Senile osteoporosis 01/31/2011   Major depressive disorder, single episode 01/31/2011   PCP:  [name], MD Pharmacy:   [facility], [place] [address] Phone: [phone] Fax: [place] [phone] [place], [place] [address] Phone: [phone] Fax: [phone]  [facility] [place], [place] - [address] AT [facility] [address] [place] [address] Phone: [phone] Fax: [phone]  Readmission Risk Interventions     View : No data to display.

## 2021-08-30 NOTE — Interval H&P Note (Signed)
History and Physical Interval Note:  08/30/2021 2:53 PM  [name]  has presented today for surgery, with the diagnosis of LEFT [place].  The various methods of treatment have been discussed with the patient and family. After consideration of risks, benefits and other options for treatment, the patient has consented to  Procedure(s): INTRAMEDULLARY (IM) NAIL INTERTROCHANTRIC (Left) as a surgical intervention.  The patient's history has been reviewed, patient examined, no change in status, stable for surgery.  I have reviewed the patient's chart and labs.  Questions were answered to the patient's satisfaction.     Hilton Cork Kaymarie Wynn

## 2021-08-30 NOTE — Transfer of Care (Signed)
Immediate Anesthesia Transfer of Care Note  Patient: [name]  Procedure(s) Performed: INTRAMEDULLARY (IM) NAIL INTERTROCHANTRIC (Left)  Patient Location: PACU  Anesthesia Type:General  Level of Consciousness: drowsy and patient cooperative  Airway & Oxygen Therapy: Patient Spontanous Breathing and Patient connected to face mask oxygen  Post-op Assessment: Report given to RN and Post -op Vital signs reviewed and stable  Post vital signs: Reviewed and stable  Last Vitals:  Vitals Value Taken Time  BP 113/68 08/30/21 1654  Temp    Pulse 82 08/30/21 1657  Resp 14 08/30/21 1657  SpO2 98 % 08/30/21 1657  Vitals shown include unvalidated device data.  Last Pain:  Vitals:   08/30/21 1201  TempSrc:   PainSc: 9       Patients Stated Pain Goal: 2 (69/79/48 0165)  Complications: No notable events documented.

## 2021-08-30 NOTE — Anesthesia Preprocedure Evaluation (Addendum)
Anesthesia Evaluation  Patient identified by MRN, date of birth, ID band Patient awake    Reviewed: Allergy & Precautions, NPO status , Patient's Chart, lab work & pertinent test results  History of Anesthesia Complications Negative for: history of anesthetic complications  Airway Mallampati: III  TM Distance: >3 FB Neck ROM: Full    Dental  (+) Dental Advisory Given, Partial Upper, Lower Dentures   Pulmonary COPD,  COPD inhaler, former smoker,    Pulmonary exam normal        Cardiovascular hypertension, Pt. on medications + CAD and + Peripheral Vascular Disease  Normal cardiovascular exam     Neuro/Psych Seizures -, Well Controlled,  PSYCHIATRIC DISORDERS Depression Dementia  Essential tremor   Neuromuscular disease    GI/Hepatic Neg liver ROS, GERD  Medicated and Controlled,  Endo/Other  negative endocrine ROS  Renal/GU negative Renal ROS     Musculoskeletal  (+) Arthritis ,  Lumbago    Abdominal   Peds  Hematology negative hematology ROS (+)   Anesthesia Other Findings   Reproductive/Obstetrics                            Anesthesia Physical Anesthesia Plan  ASA: 3  Anesthesia Plan: General   Post-op Pain Management: Tylenol PO (pre-op)*   Induction: Intravenous  PONV Risk Score and Plan: 3 and Treatment may vary due to age or medical condition, Ondansetron and TIVA  Airway Management Planned: Oral ETT  Additional Equipment: None  Intra-op Plan:   Post-operative Plan: Extubation in OR  Informed Consent: I have reviewed the patients History and Physical, chart, labs and discussed the procedure including the risks, benefits and alternatives for the proposed anesthesia with the patient or authorized representative who has indicated his/her understanding and acceptance.   Patient has DNR.  Discussed DNR with patient and Suspend DNR.   Dental advisory given  Plan  Discussed with: CRNA and Anesthesiologist  Anesthesia Plan Comments:        Anesthesia Quick Evaluation

## 2021-08-30 NOTE — Anesthesia Postprocedure Evaluation (Signed)
Anesthesia Post Note  Patient: BRANIYA FARRUGIA  Procedure(s) Performed: INTRAMEDULLARY (IM) NAIL INTERTROCHANTRIC (Left)     Patient location during evaluation: PACU Anesthesia Type: General Level of consciousness: awake and alert Pain management: pain level controlled Vital Signs Assessment: post-procedure vital signs reviewed and stable Respiratory status: spontaneous breathing, nonlabored ventilation, respiratory function stable and patient connected to nasal cannula oxygen Cardiovascular status: blood pressure returned to baseline and stable Postop Assessment: no apparent nausea or vomiting Anesthetic complications: no   No notable events documented.  Last Vitals:  Vitals:   08/30/21 1830 08/30/21 2030  BP: (!) 146/81 107/64  Pulse: 93 89  Resp: 16 14  Temp: 36.6 C 36.4 C  SpO2: 94% 94%    Last Pain:  Vitals:   08/30/21 2030  TempSrc: Oral  PainSc:                  Audry Pili

## 2021-08-30 NOTE — Anesthesia Procedure Notes (Signed)
Procedure Name: Intubation Date/Time: 08/30/2021 3:07 PM Performed by: Montel Clock, CRNA Pre-anesthesia Checklist: Patient identified, Emergency Drugs available, Suction available, Patient being monitored and Timeout performed Patient Re-evaluated:Patient Re-evaluated prior to induction Oxygen Delivery Method: Circle system utilized Preoxygenation: Pre-oxygenation with 100% oxygen Induction Type: IV induction Ventilation: Mask ventilation without difficulty and Oral airway inserted - appropriate to patient size Laryngoscope Size: Mac and 3 Grade View: Grade I Tube type: Oral Tube size: 7.0 mm Number of attempts: 1 Airway Equipment and Method: Stylet Placement Confirmation: ETT inserted through vocal cords under direct vision, positive ETCO2 and breath sounds checked- equal and bilateral Secured at: 21 cm Tube secured with: Tape Dental Injury: Teeth and Oropharynx as per pre-operative assessment

## 2021-08-30 NOTE — Discharge Instructions (Addendum)
 Dr. Amelia Macken Adult Hip & Knee Specialist [facility] [address] [place], [place] [address] [phone]   POSTOPERATIVE DIRECTIONS    Hip Rehabilitation, Guidelines Following Surgery   WEIGHT BEARING Weight bearing as tolerated with assist device (walker, cane, etc) as directed, use it as long as suggested by your surgeon or therapist, typically at least 4-6 weeks.   HOME CARE INSTRUCTIONS  Remove items at home which could result in a fall. This includes throw rugs or furniture in walking pathways.  Continue medications as instructed at time of discharge.  You may have some home medications which will be placed on hold until you complete the course of blood thinner medication.  4 days after discharge, you may start showering. No tub baths or soaking your incisions. Do not put on socks or shoes without following the instructions of your caregivers.   Sit on chairs with arms. Use the chair arms to help push yourself up when arising.  Arrange for the use of a toilet seat elevator so you are not sitting low.   Walk with walker as instructed.  You may resume a sexual relationship in one month or when given the OK by your caregiver.  Use walker as long as suggested by your caregivers.  Avoid periods of inactivity such as sitting longer than an hour when not asleep. This helps prevent blood clots.  You may return to work once you are cleared by your surgeon.  Do not drive a car for 6 weeks or until released by your surgeon.  Do not drive while taking narcotics.  Wear elastic stockings for two weeks following surgery during the day but you may remove then at night.  Make sure you keep all of your appointments after your operation with all of your doctors and caregivers. You should call the office at the above phone number and make an appointment for approximately two weeks after the date of your surgery. Please pick up a stool softener and laxative  for home use as long as you are requiring pain medications.  ICE to the affected hip every three hours for 30 minutes at a time and then as needed for pain and swelling. Continue to use ice on the hip for pain and swelling from surgery. You may notice swelling that will progress down to the foot and ankle.  This is normal after surgery.  Elevate the leg when you are not up walking on it.   It is important for you to complete the blood thinner medication as prescribed by your doctor.  Continue to use the breathing machine which will help keep your temperature down.  It is common for your temperature to cycle up and down following surgery, especially at night when you are not up moving around and exerting yourself.  The breathing machine keeps your lungs expanded and your temperature down.  RANGE OF MOTION AND STRENGTHENING EXERCISES  These exercises are designed to help you keep full movement of your hip joint. Follow your caregiver's or physical therapist's instructions. Perform all exercises about fifteen times, three times per day or as directed. Exercise both hips, even if you have had only one joint replacement. These exercises can be done on a training (exercise) mat, on the floor, on a table or on a bed. Use whatever works the best and is most comfortable for you. Use music or television while you are exercising so that the exercises are a pleasant break in your day. This   will make your life better with the exercises acting as a break in routine you can look forward to.  Lying on your back, slowly slide your foot toward your buttocks, raising your knee up off the floor. Then slowly slide your foot back down until your leg is straight again.  Lying on your back spread your legs as far apart as you can without causing discomfort.  Lying on your side, raise your upper leg and foot straight up from the floor as far as is comfortable. Slowly lower the leg and repeat.  Lying on your back, tighten up the  muscle in the front of your thigh (quadriceps muscles). You can do this by keeping your leg straight and trying to raise your heel off the floor. This helps strengthen the largest muscle supporting your knee.  Lying on your back, tighten up the muscles of your buttocks both with the legs straight and with the knee bent at a comfortable angle while keeping your heel on the floor.   SKILLED REHAB INSTRUCTIONS: If the patient is transferred to a skilled rehab facility following release from the hospital, a list of the current medications will be sent to the facility for the patient to continue.  When discharged from the skilled rehab facility, please have the facility set up the patient's Home Health Physical Therapy prior to being released. Also, the skilled facility will be responsible for providing the patient with their medications at time of release from the facility to include their pain medication and their blood thinner medication. If the patient is still at the rehab facility at time of the two week follow up appointment, the skilled rehab facility will also need to assist the patient in arranging follow up appointment in our office and any transportation needs.  MAKE SURE YOU:  Understand these instructions.  Will watch your condition.  Will get help right away if you are not doing well or get worse.  Pick up stool softner and laxative for home use following surgery while on pain medications. Daily dry dressing changes as needed. In 4 days, you may remove your dressings and begin taking showers - no tub baths or soaking the incisions. Continue to use ice for pain and swelling after surgery. Do not use any lotions or creams on the incision until instructed by your surgeon.   

## 2021-08-30 NOTE — Progress Notes (Signed)
PROGRESS NOTE    [name]  OZD:[id number] DOB: 07-Dec-1932 DOA: 08/29/2021 PCP: [name], MD    Brief Narrative:  88 year old female with a history of emphysema, hypertension, seizures, presents after a mechanical fall where she suffered a left hip fracture.  She did not have any syncopal event/loss of consciousness or head trauma.  She was seen by orthopedics and underwent operative management on 5/25.   Assessment & Plan:   Principal Problem:   Closed left hip fracture (HCC) Active Problems:   Senile dementia (HCC)   Restless legs syndrome (RLS)   Essential hypertension   Seizure disorder (HCC)   Hyperlipidemia   Fall at home, initial encounter   Panlobular emphysema ([place])   GERD (gastroesophageal reflux disease)   Heart block AV first degree   Mechanical fall Left hip fracture -Seen by orthopedics and underwent operative management on 5/25 -PT/OT -Pain management  Hypertension -On amlodipine -Blood pressure stable  Seizure disorder -Continue Keppra  GERD -Continue PPI  COPD -No wheezing at present -Continue bronchodilators  Hyperlipidemia -Continue statin  Restless leg syndrome -Continue on Mirapex bedtime   DVT prophylaxis: enoxaparin (LOVENOX) injection 30 mg Start: 08/31/21 0800 SCDs Start: 08/30/21 1833  Code Status: DNR Family Communication: No family present Disposition Plan: Status is: Inpatient Remains inpatient appropriate because: Suspect she will need placement to skilled nursing facility for short-term rehab     Consultants:  Orthopedics  Procedures:  5/25 left hip intramedullary nail  Antimicrobials:      Subjective: Patient seen postoperatively in PACU.  Admits to experiencing pain in her left hip.  Denies any shortness of breath  Objective: Vitals:   08/30/21 1730 08/30/21 1745 08/30/21 1800 08/30/21 1830  BP: 125/64 123/65 121/62 (!) 146/81  Pulse: 88 90 90 93  Resp: '14 15 15 16  '$ Temp:    97.8 F (36.6  C)  TempSrc:    Oral  SpO2: 96% 91% 91% 94%  Weight:      Height:        Intake/Output Summary (Last 24 hours) at 08/30/2021 1905 Last data filed at 08/30/2021 1641 Gross per 24 hour  Intake 2889.79 ml  Output 2100 ml  Net 789.79 ml   Filed Weights   08/29/21 1548  Weight: 74.3 kg    Examination:  General exam: Appears calm and comfortable  Respiratory system: Clear to auscultation. Respiratory effort normal. Cardiovascular system: S1 & S2 heard, RRR. No JVD, murmurs, rubs, gallops or clicks. No pedal edema. Gastrointestinal system: Abdomen is nondistended, soft and nontender. No organomegaly or masses felt. Normal bowel sounds heard. Central nervous system: Alert and oriented. No focal neurological deficits. Extremities: Symmetric 5 x 5 power. Skin: No rashes, lesions or ulcers Psychiatry: Judgement and insight appear normal. Mood & affect appropriate.     Data Reviewed: I have personally reviewed following labs and imaging studies  CBC: Recent Labs  Lab 08/29/21 0930 08/30/21 0323  WBC 3.9* 6.4  NEUTROABS 2.3  --   HGB 12.6 11.5*  HCT 39.9 38.2  MCV 84.2 86.6  PLT 218 259   Basic Metabolic Panel: Recent Labs  Lab 08/29/21 0930 08/30/21 0323  NA 141 143  K 3.7 3.8  CL 107 113*  CO2 27 26  GLUCOSE 94 88  BUN 24* 14  CREATININE 0.90 0.88  CALCIUM 9.5 8.7*   GFR: Estimated Creatinine Clearance: 43.3 mL/min (by C-G formula based on SCr of 0.88 mg/dL). Liver Function Tests: No results for input(s): AST, ALT,  ALKPHOS, BILITOT, PROT, ALBUMIN in the last 168 hours. No results for input(s): LIPASE, AMYLASE in the last 168 hours. No results for input(s): AMMONIA in the last 168 hours. Coagulation Profile: Recent Labs  Lab 08/29/21 0930  INR 1.0   Cardiac Enzymes: No results for input(s): CKTOTAL, CKMB, CKMBINDEX, TROPONINI in the last 168 hours. BNP (last 3 results) No results for input(s): PROBNP in the last 8760 hours. HbA1C: No results for  input(s): HGBA1C in the last 72 hours. CBG: No results for input(s): GLUCAP in the last 168 hours. Lipid Profile: No results for input(s): CHOL, HDL, LDLCALC, TRIG, CHOLHDL, LDLDIRECT in the last 72 hours. Thyroid Function Tests: No results for input(s): TSH, T4TOTAL, FREET4, T3FREE, THYROIDAB in the last 72 hours. Anemia Panel: No results for input(s): VITAMINB12, FOLATE, FERRITIN, TIBC, IRON, RETICCTPCT in the last 72 hours. Sepsis Labs: No results for input(s): PROCALCITON, LATICACIDVEN in the last 168 hours.  Recent Results (from the past 240 hour(s))  Surgical pcr screen     Status: None   Collection Time: 08/29/21  6:31 PM   Specimen: Nasal Mucosa; Nasal Swab  Result Value Ref Range Status   MRSA, PCR NEGATIVE NEGATIVE Final   Staphylococcus aureus NEGATIVE NEGATIVE Final    Comment: (NOTE) The Xpert SA Assay (FDA approved for NASAL specimens in patients 11 years of age and older), is one component of a comprehensive surveillance program. It is not intended to diagnose infection nor to guide or monitor treatment. Performed at [facility], [place] [address], [place], [place] [address]          Radiology Studies: DG Chest 1 View  Result Date: 08/29/2021 CLINICAL DATA:  fall, pain EXAM: CHEST  1 VIEW COMPARISON:  05/10/2019 FINDINGS: Chronically elevated left hemidiaphragm with left basilar atelectasis. Cardiomediastinal silhouette is unchanged. No large pleural effusion. No visible pneumothorax. Left reverse total shoulder arthroplasty. Right glenohumeral osteoarthritis with appearance of a sclerotic humeral head, possibly reflecting AVN. Possible anterior left lower rib fractures involving ribs 8 and 9. Dextroconvex lower thoracic/upper lumbar curvature. IMPRESSION: Possible nondisplaced fractures involving the anterior left 8 and ninth ribs. Chronically elevated left hemidiaphragm with left basilar subsegmental atelectasis. Sclerotic appearance of the right  humeral head possibly reflecting AVN. Electronically Signed   By: Maurine Simmering M.D.   On: 08/29/2021 09:34   Pelvis Portable  Result Date: 08/30/2021 CLINICAL DATA:  Status post left femur ORIF EXAM: PORTABLE PELVIS 1-2 VIEWS COMPARISON:  08/29/2021 FINDINGS: Status post gamma nail fixation of intertrochanteric/basicervical femur fracture. Components appear well positioned. Lesser trochanter is displaced relative to the preoperative imaging. IMPRESSION: Status post gamma nail fixation. Good position and alignment, with exception that the lesser trochanter is now displaced from its original position. Electronically Signed   By: Nelson Chimes M.D.   On: 08/30/2021 17:52   CT HIP LEFT WO CONTRAST  Addendum Date: 08/29/2021   ADDENDUM REPORT: 08/29/2021 14:44 ADDENDUM: Incidental note of an enlarged left inguinal lymph node measuring up to 3.6 by 1.7 by 4.3 cm. This is nonspecific and may be reactive, however cannot exclude malignant involvement. This is increased from 2.4 by 0.9 cm on remote 11/08/2015 PET-CT. Recommend clinical correlation. Electronically Signed   By: Yvonne Kendall M.D.   On: 08/29/2021 14:44   Result Date: 08/29/2021 CLINICAL DATA:  Unwitnessed fall. Hip surgical planning. Left hip pain and shortening and rotation. CT planning for surgery. EXAM: CT OF THE LEFT HIP WITHOUT CONTRAST TECHNIQUE: Multidetector CT imaging of the left  hip was performed according to the standard protocol. Multiplanar CT image reconstructions were also generated. RADIATION DOSE REDUCTION: This exam was performed according to the departmental dose-optimization program which includes automated exposure control, adjustment of the mA and/or kV according to patient size and/or use of iterative reconstruction technique. COMPARISON:  Left hip radiographs 08/29/2021; CT abdomen and pelvis 10/20/2015 FINDINGS: Bones/Joint/Cartilage Mild pubic symphysis joint space narrowing and peripheral degenerative spurring. No cortical  erosion or suspicious bone marrow lesion is seen, with attention to the superomedial aspect of the right pubic body questioned on radiograph. The relative lucency may be due to overlying bowel gas within the rectum on that radiograph. There is a comminuted acute fracture of the distal femoral neck and intertrochanteric proximal left femur. There is mild posterior angulation of the intervening fracture fragments (axial series 2, image 47). External rotation of the greater trochanter region of the proximal femur. Multiple fracture lines extend through the greater trochanter. Mild-to-moderate superior impaction of the distal fracture component. Mild-to-moderate varus angulation. Nondisplaced fracture line extends through the anterior aspect of the lesser trochanter (coronal series 6, image 48). Mild-to-moderate left femoroacetabular joint space narrowing and peripheral osteophytosis. Partial visualization of old healed right inferior pubic ramus fracture. Ligaments Suboptimally assessed by CT. Muscles and Tendons Normal density and size of the regional musculature. Soft tissues Mild diverticulosis at the junction of the distal descending colon and proximal sigmoid colon. There is a wide-mouth partially visualized midline ventral hernia of the pelvis seen diffusely. At the inferior aspect, there is a component that is smaller in transverse dimension and contains mesenteric fat and nondistended loops of small bowel, measuring up to 4.8 x 4.1 cm (transverse by AP, as measured on axial series 2, image 50). There also appears to be a wide mouth hernia lateral to the rectus abdominis musculature within the anterior left pelvis containing mesenteric fat and nondistended loops of small bowel. The uterus appears to be surgically absent. IMPRESSION: 1. Acute, comminuted, mildly displaced, mild to moderately impacted, and mildly varus angulated proximal left femoral fracture of the junction of the distal femoral neck and  intertrochanteric region. 2. Mild pubic symphysis osteoarthritis. No cortical erosion or suspicious bone marrow lesion is seen. 3. Old healed right inferior pubic ramus fracture. 4. Midline and left paramidline ventral abdominal and pelvic hernias containing mesenteric fat and nondistended loops of small bowel. Electronically Signed: By: Yvonne Kendall M.D. On: 08/29/2021 11:43   DG Knee Left Port  Result Date: 08/29/2021 CLINICAL DATA:  Acute left hip fracture.  Pain. EXAM: PORTABLE LEFT KNEE - 1-2 VIEW COMPARISON:  Left knee radiographs 04/26/2020 FINDINGS: There are 2 new screws from inferior approach fixating the previously seen transverse fracture of the mid height of the patella. There is now normal alignment of the fracture components with resolution of the prior diastasis. Moderate patellofemoral joint space narrowing and peripheral osteophytosis. Mild-to-moderate medial and lateral compartment joint space narrowing and peripheral osteophytosis. No acute fracture or dislocation. No joint effusion. Resolution of the prior prepatellar soft tissue swelling/hematoma. IMPRESSION: Interval ORIF of the prior transverse fracture of the midpole of the patella with resolution of the prior bone distraction. Electronically Signed   By: Yvonne Kendall M.D.   On: 08/29/2021 11:45   DG Hip Unilat With Pelvis 2-3 Views Left  Result Date: 08/29/2021 CLINICAL DATA:  fall, pain EXAM: DG HIP (WITH OR WITHOUT PELVIS) 2-3V LEFT COMPARISON:  CT 10/20/2015 FINDINGS: There is an acute, mildly displaced left-sided intertrochanteric fracture. There are chronic  right superior and inferior pubic rami fractures. There is an indistinct appearance of the superior cortex of the right pubic body with adjacent osseous lucency. Mild, left greater than right hip osteoarthritis. Mild degenerative changes of the sacroiliac joints. IMPRESSION: Acute, mildly displaced left intertrochanteric femur fracture. Possible lucent lesion within the  right superior pubic body, recommend pelvis CT. Chronic right superior and inferior pubic rami fractures. Electronically Signed   By: Maurine Simmering M.D.   On: 08/29/2021 09:29        Scheduled Meds:  amLODipine  2.5 mg Oral Daily   [START ON 08/31/2021] aspirin  81 mg Oral Daily   cholecalciferol  1,000 Units Oral Daily   docusate sodium  100 mg Oral BID   [START ON 08/31/2021] enoxaparin (LOVENOX) injection  30 mg Subcutaneous Q24H   feeding supplement  237 mL Oral BID BM   furosemide  20 mg Oral Daily   lamoTRIgine  150 mg Oral BID   levETIRAcetam  250 mg Oral Daily   levETIRAcetam  500 mg Oral QHS   multivitamin with minerals  1 tablet Oral Daily   pantoprazole  40 mg Oral Daily   potassium chloride  20 mEq Oral Daily   pramipexole  0.5 mg Oral QHS   senna  1 tablet Oral BID   simvastatin  10 mg Oral QHS   umeclidinium bromide  1 puff Inhalation Daily   Continuous Infusions:  sodium chloride 125 mL/hr at 08/30/21 0657    ceFAZolin (ANCEF) IV     tranexamic acid       LOS: 1 day    Time spent: 95mns    JKathie Dike MD [facility]   If 7PM-7AM, please contact night-coverage [email or web address]  08/30/2021, 7:05 PM

## 2021-08-30 NOTE — Progress Notes (Signed)
Initial Nutrition Assessment  DOCUMENTATION CODES:   Not applicable  INTERVENTION:  - diet advancement as medically feasible post-op.  - will order Ensure Surgery BID, each supplement provides 330 kcal and 18 grams of protein.  - will order 1 tablet multivitamin with minerals/day.  - complete NFPE when feasible.    NUTRITION DIAGNOSIS:   Increased nutrient needs related to hip fracture, post-op healing as evidenced by estimated needs  GOAL:   Patient will meet greater than or equal to 90% of their needs  MONITOR:   Diet advancement, PO intake, Supplement acceptance, Labs, Weight trends  REASON FOR ASSESSMENT:   Malnutrition Screening Tool  ASSESSMENT:   88 y.o. female with medical history of HTN, PAD, COPD, panlobular emphysema, seizures, dementia, insomnia, major depressive disorder, mitral valve disorder, osteoporosis, essential tremor, and arthritis. She presented to the ED after a fall with subsequent L hip pain. She was admitted for closed L hip fracture.  Patient is out of the room to OR for L hip IM nail. Diet advanced from NPO to Heart Healthy, thin liquids yesterday at 1150 and then back to NPO at midnight; no meal intakes documented.   She has not been seen by a [place] RD at any time in the past.   Weight yesterday was 164 lb weight has been stable over the past 6 months. Non-pitting edema to LLE documented in the edema section of flow sheet.    Labs reviewed; Cl: 113 mmol/l, Ca: 8.7 mg/dl.  Medications reviewed; 1000 units cholecalciferol/day, 20 mg oral lasix/day, 40 mg oral protonix/day, 20 mEq Klor-Con/day.  IVF; NS @ 125 ml/hr.    NUTRITION - FOCUSED PHYSICAL EXAM:  Patient in OR.  Diet Order:   Diet Order             Diet NPO time specified Except for: Sips with Meds  Diet effective midnight                   EDUCATION NEEDS:   No education needs have been identified at this time  Skin:  Skin Assessment: Reviewed RN  Assessment  Last BM:  PTA/unknown  Height:   Ht Readings from Last 1 Encounters:  08/29/21 '5\' 2"'$  (1.575 m)    Weight:   Wt Readings from Last 1 Encounters:  08/29/21 74.3 kg     BMI:  Body mass index is 29.94 kg/m.  Estimated Nutritional Needs:  Kcal:  1650-1850 kcal Protein:  80-90 grams Fluid:  >/= 1.7 L/day     Jarome Matin, MS, RD, LDN Registered Dietitian II Inpatient Clinical Nutrition RD pager # and on-call/weekend pager # available in [facility]

## 2021-08-31 ENCOUNTER — Encounter (HOSPITAL_COMMUNITY): Payer: Self-pay | Admitting: Orthopedic Surgery

## 2021-08-31 DIAGNOSIS — I10 Essential (primary) hypertension: Secondary | ICD-10-CM | POA: Diagnosis not present

## 2021-08-31 DIAGNOSIS — K219 Gastro-esophageal reflux disease without esophagitis: Secondary | ICD-10-CM

## 2021-08-31 DIAGNOSIS — S72002A Fracture of unspecified part of neck of left femur, initial encounter for closed fracture: Secondary | ICD-10-CM | POA: Diagnosis not present

## 2021-08-31 DIAGNOSIS — W19XXXA Unspecified fall, initial encounter: Secondary | ICD-10-CM | POA: Diagnosis not present

## 2021-08-31 DIAGNOSIS — E782 Mixed hyperlipidemia: Secondary | ICD-10-CM

## 2021-08-31 LAB — BASIC METABOLIC PANEL
Anion gap: 6 (ref 5–15)
BUN: 15 mg/dL (ref 8–23)
CO2: 28 mmol/L (ref 22–32)
Calcium: 8.8 mg/dL — ABNORMAL LOW (ref 8.9–10.3)
Chloride: 109 mmol/L (ref 98–111)
Creatinine, Ser: 0.76 mg/dL (ref 0.44–1.00)
GFR, Estimated: >60 mL/min (ref >60)
Glucose, Bld: 120 mg/dL — ABNORMAL HIGH (ref 70–99)
Potassium: 3.7 mmol/L (ref 3.5–5.1)
Sodium: 143 mmol/L (ref 135–145)

## 2021-08-31 LAB — CBC
HCT: 35.3 % — ABNORMAL LOW (ref 36.0–46.0)
Hemoglobin: 10.7 g/dL — ABNORMAL LOW (ref 12.0–15.0)
MCH: 26.4 pg (ref 26.0–34.0)
MCHC: 30.3 g/dL (ref 30.0–36.0)
MCV: 87.2 fL (ref 80.0–100.0)
Platelets: 190 10*3/uL (ref 150–400)
RBC: 4.05 MIL/uL (ref 3.87–5.11)
RDW: 15.7 % — ABNORMAL HIGH (ref 11.5–15.5)
WBC: 7.5 10*3/uL (ref 4.0–10.5)
nRBC: 0 % (ref 0.0–0.2)

## 2021-08-31 MED ORDER — ASPIRIN 81 MG PO CHEW: 81.0000 mg | CHEWABLE_TABLET | Freq: Two times a day (BID) | ORAL | 0 refills | Status: AC

## 2021-08-31 MED ORDER — HYDROCODONE-ACETAMINOPHEN 10-325 MG PO TABS: 0.5000 | ORAL_TABLET | ORAL | 0 refills | Status: DC | PRN

## 2021-08-31 NOTE — TOC Progression Note (Addendum)
Transition of Care [facility]) - Progression Note   Patient Details  Name: [name] MRN: [id number] Date of Birth: 1932-05-12  Transition of Care [facility]) CM/SW Jay, LCSW Phone Number: 08/31/2021, 2:00 PM  Clinical Narrative: CSW confirmed with [name] at [facility] SN[facility] that patient can be admitted back to the facility tomorrow pending insurance approval for rehab. The number for report is [phone] ext. [phone]. On call number for the facility is [phone]. FL2 completed and faxed to facility in hub. Insurance authorization can be done after PT works with patient for a second session. TOC to follow.  Addendum: CSW started insurance authorization for SNF on [employer] portal. Reference ID # is: [id number]. Approval is pending.  Expected Discharge Plan: [place] Barriers to Discharge: Continued Medical Work up, Ship broker  Expected Discharge Plan and Services Expected Discharge Plan: [place] In-house Referral: Clinical Social Work Post Acute Care Choice: [place] Living arrangements for the past 2 months: [place]          DME Arranged: N/A DME Agency: NA  Readmission Risk Interventions     View : No data to display.

## 2021-08-31 NOTE — Progress Notes (Signed)
    Subjective:  Patient reports pain as mild to moderate.  Denies N/V/CP/SOB/Abd pain/ Dizziness. Patient states that she is having some pain in her leg but it is managed. She says that she is having some swelling in her knee. She denies tingling and numbness in her LE bilaterally.   Objective:   VITALS:   Vitals:   08/30/21 1830 08/30/21 2030 08/31/21 0131 08/31/21 0555  BP: (!) 146/81 107/64 131/88 134/72  Pulse: 93 89 99 97  Resp: $Remo'16 14 16 16  'ReKqy$ Temp: 97.8 F (36.6 C) 97.6 F (36.4 C) 98.3 F (36.8 C) 98.5 F (36.9 C)  TempSrc: Oral Oral Oral Oral  SpO2: 94% 94% 95% 95%  Weight:      Height:        Patient is sitting in her recliner. NAD. She is on O2 2L via nasal cannula.  ABD soft Neurovascular intact Sensation intact distally Intact pulses distally Dorsiflexion/Plantar flexion intact No cellulitis present Compartment soft Mepilex dressings C/D/I.  She has vascular changes noted in lower legs likely from chronic venous insufficiency. Mild expected edema in left leg.  Vertical scar over left knee consistent with TKA.   Lab Results  Component Value Date   WBC 7.5 08/31/2021   HGB 10.7 (L) 08/31/2021   HCT 35.3 (L) 08/31/2021   MCV 87.2 08/31/2021   PLT 190 08/31/2021   BMET    Component Value Date/Time   NA 143 08/31/2021 0332   NA 141 05/22/2021 0000   NA 143 06/22/2014 1034   K 3.7 08/31/2021 0332   K 5.0 06/22/2014 1034   CL 109 08/31/2021 0332   CO2 28 08/31/2021 0332   CO2 28 06/22/2014 1034   GLUCOSE 120 (H) 08/31/2021 0332   GLUCOSE 82 06/22/2014 1034   BUN 15 08/31/2021 0332   BUN 23 (A) 05/22/2021 0000   BUN 29.6 (H) 06/22/2014 1034   CREATININE 0.76 08/31/2021 0332   CREATININE 1.02 (H) 06/27/2015 1055   CREATININE 0.8 06/22/2014 1034   CALCIUM 8.8 (L) 08/31/2021 0332   CALCIUM 9.7 06/22/2014 1034   EGFR 64 05/22/2021 0000   GFRNONAA >60 08/31/2021 0332     Assessment/Plan: 1 Day Post-Op   Principal Problem:   Closed left hip  fracture (HCC) Active Problems:   Restless legs syndrome (RLS)   Essential hypertension   Seizure disorder (HCC)   Hyperlipidemia   Senile dementia ([place])   Fall at home, initial encounter   Panlobular emphysema (HCC)   GERD (gastroesophageal reflux disease)   Heart block AV first degree  S/p Intramedullary fixation, Left femur from intertrochanteric femur fracture.    WBAT with walker DVT ppx: Lovenox while in hospital will switch to Aspirin $RemoveBe'81mg'chwWcahYR$  BID upon discharge. , SCDs, TEDS PO pain control PT/OT: Patient was limited in therapy due to pain and deconditioning. Continue to work with PT.  Dispo: D/c likely back to SNF. She is WBAT on LLE. Hospitalist to d/c when medically ready. Continue to work with PT while in hospital. Rx for pain medication and DVT ppx printed in chart.    Charlott Rakes, PA-C 08/31/2021, 2:02 PM  [facility]  [facility] [address], [place], [place], [place] [address] Phone: [phone] [email or web address] Facebook  [employer]

## 2021-08-31 NOTE — Progress Notes (Signed)
PROGRESS NOTE    [name]  FXT:[id number] DOB: 16-Mar-1933 DOA: 08/29/2021 PCP: [name], MD    Brief Narrative:  88 year old female with a history of emphysema, hypertension, seizures, presents after a mechanical fall where she suffered a left hip fracture.  She did not have any syncopal event/loss of consciousness or head trauma.  She was seen by orthopedics and underwent operative management on 5/25.   Assessment & Plan:   Principal Problem:   Closed left hip fracture (HCC) Active Problems:   Senile dementia (HCC)   Restless legs syndrome (RLS)   Essential hypertension   Seizure disorder (HCC)   Hyperlipidemia   Fall at home, initial encounter   Panlobular emphysema ([place])   GERD (gastroesophageal reflux disease)   Heart block AV first degree   Mechanical fall Left hip fracture -Seen by orthopedics and underwent operative management on 5/25 -PT/OT -Pain management  Hypertension -On amlodipine -Blood pressure stable  Seizure disorder -Continue Keppra  GERD -Continue PPI  COPD -No wheezing at present -Continue bronchodilators  Hyperlipidemia -Continue statin  Restless leg syndrome -Continue on Mirapex bedtime   DVT prophylaxis: enoxaparin (LOVENOX) injection 30 mg Start: 08/31/21 0800 SCDs Start: 08/30/21 1833  Code Status: DNR Family Communication: No family present.  Tried calling patient's sister, POA, but unable to reach her over the phone Disposition Plan: Status is: Inpatient Remains inpatient appropriate because: Continued postop care for hip surgery     Consultants:  Orthopedics  Procedures:  5/25 left hip intramedullary nail  Antimicrobials:      Subjective: She is seen sitting up in the chair.  Describes pain in her hip.  Denies any shortness of breath.  Objective: Vitals:   08/31/21 0131 08/31/21 0555 08/31/21 1519 08/31/21 1750  BP: 131/88 134/72 112/68 104/64  Pulse: 99 97 88 97  Resp: '16 16 18 16  '$ Temp:  98.3 F (36.8 C) 98.5 F (36.9 C) (!) 97.5 F (36.4 C) 97.9 F (36.6 C)  TempSrc: Oral Oral  Oral  SpO2: 95% 95% 97% 96%  Weight:      Height:        Intake/Output Summary (Last 24 hours) at 08/31/2021 1916 Last data filed at 08/31/2021 1400 Gross per 24 hour  Intake 3861.71 ml  Output 250 ml  Net 3611.71 ml   Filed Weights   08/29/21 1548  Weight: 74.3 kg    Examination:  General exam: Appears calm and comfortable  Respiratory system: Clear to auscultation. Respiratory effort normal. Cardiovascular system: S1 & S2 heard, RRR. No JVD, murmurs, rubs, gallops or clicks. No pedal edema. Gastrointestinal system: Abdomen is nondistended, soft and nontender. No organomegaly or masses felt. Normal bowel sounds heard. Central nervous system: Alert and oriented. No focal neurological deficits. Extremities: Symmetric 5 x 5 power. Skin: No rashes, lesions or ulcers Psychiatry: Judgement and insight appear normal. Mood & affect appropriate.     Data Reviewed: I have personally reviewed following labs and imaging studies  CBC: Recent Labs  Lab 08/29/21 0930 08/30/21 0323 08/31/21 0332  WBC 3.9* 6.4 7.5  NEUTROABS 2.3  --   --   HGB 12.6 11.5* 10.7*  HCT 39.9 38.2 35.3*  MCV 84.2 86.6 87.2  PLT 218 190 299   Basic Metabolic Panel: Recent Labs  Lab 08/29/21 0930 08/30/21 0323 08/31/21 0332  NA 141 143 143  K 3.7 3.8 3.7  CL 107 113* 109  CO2 '27 26 28  '$ GLUCOSE 94 88 120*  BUN 24* 14  15  CREATININE 0.90 0.88 0.76  CALCIUM 9.5 8.7* 8.8*   GFR: Estimated Creatinine Clearance: 47.7 mL/min (by C-G formula based on SCr of 0.76 mg/dL). Liver Function Tests: No results for input(s): AST, ALT, ALKPHOS, BILITOT, PROT, ALBUMIN in the last 168 hours. No results for input(s): LIPASE, AMYLASE in the last 168 hours. No results for input(s): AMMONIA in the last 168 hours. Coagulation Profile: Recent Labs  Lab 08/29/21 0930  INR 1.0   Cardiac Enzymes: No results for  input(s): CKTOTAL, CKMB, CKMBINDEX, TROPONINI in the last 168 hours. BNP (last 3 results) No results for input(s): PROBNP in the last 8760 hours. HbA1C: No results for input(s): HGBA1C in the last 72 hours. CBG: No results for input(s): GLUCAP in the last 168 hours. Lipid Profile: No results for input(s): CHOL, HDL, LDLCALC, TRIG, CHOLHDL, LDLDIRECT in the last 72 hours. Thyroid Function Tests: No results for input(s): TSH, T4TOTAL, FREET4, T3FREE, THYROIDAB in the last 72 hours. Anemia Panel: No results for input(s): VITAMINB12, FOLATE, FERRITIN, TIBC, IRON, RETICCTPCT in the last 72 hours. Sepsis Labs: No results for input(s): PROCALCITON, LATICACIDVEN in the last 168 hours.  Recent Results (from the past 240 hour(s))  Surgical pcr screen     Status: None   Collection Time: 08/29/21  6:31 PM   Specimen: Nasal Mucosa; Nasal Swab  Result Value Ref Range Status   MRSA, PCR NEGATIVE NEGATIVE Final   Staphylococcus aureus NEGATIVE NEGATIVE Final    Comment: (NOTE) The Xpert SA Assay (FDA approved for NASAL specimens in patients 33 years of age and older), is one component of a comprehensive surveillance program. It is not intended to diagnose infection nor to guide or monitor treatment. Performed at [facility], [place] [address], [place], [place] [address]          Radiology Studies: Pelvis Portable  Result Date: 08/30/2021 CLINICAL DATA:  Status post left femur ORIF EXAM: PORTABLE PELVIS 1-2 VIEWS COMPARISON:  08/29/2021 FINDINGS: Status post gamma nail fixation of intertrochanteric/basicervical femur fracture. Components appear well positioned. Lesser trochanter is displaced relative to the preoperative imaging. IMPRESSION: Status post gamma nail fixation. Good position and alignment, with exception that the lesser trochanter is now displaced from its original position. Electronically Signed   By: Nelson Chimes M.D.   On: 08/30/2021 17:52   DG C-Arm 1-60  Min-No Report  Result Date: 08/30/2021 Fluoroscopy was utilized by the requesting physician.  No radiographic interpretation.   DG HIP UNILAT WITH PELVIS 1V LEFT  Result Date: 08/30/2021 CLINICAL DATA:  Left IM nail EXAM: DG HIP (WITH OR WITHOUT PELVIS) 1V*L*; DG C-ARM 1-60 MIN-NO REPORT COMPARISON:  CT left hip dated 08/29/2021 Fluoroscopy time: 1 minute 8 seconds 16.98 mGy FINDINGS: IM nail with dynamic hip screw fixation of an intertrochanteric left hip fracture. Fracture fragments are in near anatomic alignment and position. IMPRESSION: Status post ORIF of a left hip fracture, as above. Electronically Signed   By: Julian Hy M.D.   On: 08/30/2021 20:09        Scheduled Meds:  amLODipine  2.5 mg Oral Daily   aspirin  81 mg Oral Daily   cholecalciferol  1,000 Units Oral Daily   docusate sodium  100 mg Oral BID   enoxaparin (LOVENOX) injection  30 mg Subcutaneous Q24H   feeding supplement  237 mL Oral BID BM   furosemide  20 mg Oral Daily   lamoTRIgine  150 mg Oral BID   levETIRAcetam  250 mg Oral  Daily   levETIRAcetam  500 mg Oral QHS   multivitamin with minerals  1 tablet Oral Daily   pantoprazole  40 mg Oral Daily   potassium chloride  20 mEq Oral Daily   pramipexole  0.5 mg Oral QHS   senna  1 tablet Oral BID   simvastatin  10 mg Oral QHS   umeclidinium bromide  1 puff Inhalation Daily   Continuous Infusions:  sodium chloride 125 mL/hr at 08/31/21 0253     LOS: 2 days    Time spent: 21mns    JKathie Dike MD [facility]   If 7PM-7AM, please contact night-coverage [email or web address]  08/31/2021, 7:16 PM

## 2021-08-31 NOTE — NC FL2 (Signed)
[place] LEVEL OF CARE SCREENING TOOL     IDENTIFICATION  Patient Name: [name] Birthdate: 1932-08-31 Sex: female Admission Date (Current Location): 08/29/2021  [place] and [place] Number:  Herbalist and Address:  [facility],  [place], [place]      Provider Number: [id number]  Attending Physician Name and Address:  Kathie Dike, MD  Relative Name and Phone Number:  [name] (sister) Ph: [phone]    Current Level of Care: Hospital Recommended Level of Care: [place] Prior Approval Number:    Date Approved/Denied:   PASRR Number: [id number]  Discharge Plan: SNF    Current Diagnoses: Patient Active Problem List   Diagnosis Date Noted   Closed left hip fracture ([place]) 08/29/2021   Heart block AV first degree 08/29/2021   Skin irritation 07/12/2021   History of retinal detachment 02/20/2021   Fuchs' corneal dystrophy of both eyes 02/20/2021   Choroidal nevus of right eye 02/20/2021   Pseudophakia, both eyes 02/20/2021   Dry skin dermatitis 01/29/2021   COVID-19 virus infection 09/25/2020   GERD (gastroesophageal reflux disease) 04/28/2020   Nondisplaced transverse fracture of left patella, subsequent encounter for closed fracture with nonunion 04/28/2020   Generalized osteoarthritis of multiple sites 10/05/2019   Olecranon bursitis of right elbow 10/04/2019   PAD (peripheral artery disease) ([place]) 06/23/2019   Slow transit constipation 05/21/2019   Generalized weakness 05/19/2019   Vasovagal near syncope 05/19/2019   History of urinary retention 05/19/2019   Mandibular fracture, closed ([place]) 05/19/2019   Humerus fracture 05/04/2019   Right thyroid nodule 02/28/2016   Abnormal thyroid uptake 11/10/2015   Coronary artery calcification 12/05/2014   Panlobular emphysema ([place]) 07/25/2014   Dyspnea and respiratory abnormality 07/25/2014   Multiple lung nodules on CT 07/25/2014    Smoking history 07/25/2014   Pain of right lower leg 05/23/2014   Fall at home, initial encounter 05/23/2014   Gait disorder 04/25/2014   Senile dementia ([place]) 04/25/2014   Essential tremor 04/25/2014   Advanced directives, counseling/discussion 02/18/2013   Seizure disorder ([place]) 10/15/2012   Cancer of skin, squamous cell 10/15/2012   Unspecified vitamin D deficiency 10/15/2012   Hyperlipidemia 10/15/2012   Edema 09/10/2012   Hordeolum externum (stye) 09/10/2012   Restless legs syndrome (RLS) 09/12/2011   Insomnia, unspecified 09/12/2011   COPD (chronic obstructive pulmonary disease) ([place]) 05/23/2011   Essential hypertension 01/31/2011   Senile osteoporosis 01/31/2011   Major depressive disorder, single episode 01/31/2011    Orientation RESPIRATION BLADDER Height & Weight     Self, Time, Situation, Place  O2 (2L/min) Continent Weight: 163 lb 11.2 oz (74.3 kg) Height:  '5\' 2"'$  (157.5 cm)  BEHAVIORAL SYMPTOMS/MOOD NEUROLOGICAL BOWEL NUTRITION STATUS    Convulsions/Seizures Continent Diet (Heart healthy)  AMBULATORY STATUS COMMUNICATION OF NEEDS Skin   Extensive Assist Verbally Surgical wounds, Other (Comment), Skin abrasions (Abrasions & Ecchymosis: bilateral arms and legs)                       Personal Care Assistance Level of Assistance  Bathing, Feeding, Dressing Bathing Assistance: Maximum assistance Feeding assistance: Independent Dressing Assistance: Maximum assistance     Functional Limitations Info  Sight, Hearing, Speech Sight Info: Impaired Hearing Info: Adequate Speech Info: Adequate    SPECIAL CARE FACTORS FREQUENCY  PT (By licensed PT), OT (By licensed OT)     PT Frequency: 5x's/week OT Frequency: 5x's/week  Contractures Contractures Info: Not present    Additional Factors Info  Code Status, Allergies Code Status Info: DNR Allergies Info: Vioxx (Rofecoxib), Dyazide (Hydrochlorothiazide W-triamterene), Latex, Sulfa Antibiotics,  Sumycin (Tetracycline)           Current Medications (08/31/2021):  This is the current hospital active medication list Current Facility-Administered Medications  Medication Dose Route Frequency Provider Last Rate Last Admin   0.9 %  sodium chloride infusion   Intravenous Continuous Rod Can, MD 125 mL/hr at 08/31/21 0253 New Bag at 08/31/21 0253   acetaminophen (TYLENOL) tablet 325-650 mg  325-650 mg Oral Q6H PRN Swinteck, Aaron Edelman, MD       acetaminophen (TYLENOL) tablet 650 mg  650 mg Oral Q6H PRN Swinteck, Aaron Edelman, MD       albuterol (PROVENTIL) (2.5 MG/3ML) 0.083% nebulizer solution 3 mL  3 mL Inhalation Daily PRN Swinteck, Aaron Edelman, MD       amLODipine (NORVASC) tablet 2.5 mg  2.5 mg Oral Daily Swinteck, Aaron Edelman, MD   2.5 mg at 08/31/21 0865   aspirin chewable tablet 81 mg  81 mg Oral Daily Rod Can, MD   81 mg at 08/31/21 7846   cholecalciferol (VITAMIN D3) tablet 1,000 Units  1,000 Units Oral Daily Rod Can, MD   1,000 Units at 08/31/21 0804   docusate sodium (COLACE) capsule 100 mg  100 mg Oral BID Rod Can, MD   100 mg at 08/31/21 0807   enoxaparin (LOVENOX) injection 30 mg  30 mg Subcutaneous Q24H Swinteck, Aaron Edelman, MD   30 mg at 08/31/21 9629   feeding supplement (ENSURE SURGERY) liquid 237 mL  237 mL Oral BID BM Swinteck, Aaron Edelman, MD   237 mL at 08/31/21 1009   furosemide (LASIX) tablet 20 mg  20 mg Oral Daily Swinteck, Aaron Edelman, MD   20 mg at 08/31/21 5284   hydrALAZINE (APRESOLINE) injection 10 mg  10 mg Intravenous Q8H PRN Swinteck, Aaron Edelman, MD       HYDROcodone-acetaminophen (NORCO) 7.5-325 MG per tablet 1-2 tablet  1-2 tablet Oral Q4H PRN Rod Can, MD       HYDROcodone-acetaminophen (NORCO/VICODIN) 5-325 MG per tablet 1-2 tablet  1-2 tablet Oral Q4H PRN Rod Can, MD   1 tablet at 08/31/21 1324   lamoTRIgine (LAMICTAL) tablet 150 mg  150 mg Oral BID Rod Can, MD   150 mg at 08/31/21 0802   levETIRAcetam (KEPPRA) tablet 250 mg  250 mg Oral Daily  Swinteck, Aaron Edelman, MD   250 mg at 08/31/21 4010   levETIRAcetam (KEPPRA) tablet 500 mg  500 mg Oral QHS Swinteck, Aaron Edelman, MD   500 mg at 08/30/21 2037   menthol-cetylpyridinium (CEPACOL) lozenge 3 mg  1 lozenge Oral PRN Rod Can, MD       Or   phenol (CHLORASEPTIC) mouth spray 1 spray  1 spray Mouth/Throat PRN Swinteck, Aaron Edelman, MD       metoCLOPramide (REGLAN) tablet 5-10 mg  5-10 mg Oral Q8H PRN Swinteck, Aaron Edelman, MD       Or   metoCLOPramide (REGLAN) injection 5-10 mg  5-10 mg Intravenous Q8H PRN Swinteck, Aaron Edelman, MD       morphine (PF) 2 MG/ML injection 0.5-1 mg  0.5-1 mg Intravenous Q2H PRN Swinteck, Aaron Edelman, MD       multivitamin with minerals tablet 1 tablet  1 tablet Oral Daily Rod Can, MD   1 tablet at 08/31/21 0803   ondansetron (ZOFRAN) tablet 4 mg  4 mg Oral Q6H PRN Rod Can, MD  Or   ondansetron (ZOFRAN) injection 4 mg  4 mg Intravenous Q6H PRN Swinteck, Aaron Edelman, MD       pantoprazole (PROTONIX) EC tablet 40 mg  40 mg Oral Daily Swinteck, Aaron Edelman, MD   40 mg at 08/31/21 2585   potassium chloride (KLOR-CON M) CR tablet 20 mEq  20 mEq Oral Daily Swinteck, Aaron Edelman, MD   20 mEq at 08/31/21 2778   pramipexole (MIRAPEX) tablet 0.5 mg  0.5 mg Oral QHS Swinteck, Aaron Edelman, MD   0.5 mg at 08/30/21 2035   prochlorperazine (COMPAZINE) injection 10 mg  10 mg Intravenous Q6H PRN Rod Can, MD   10 mg at 08/29/21 1942   senna (SENOKOT) tablet 8.6 mg  1 tablet Oral BID Rod Can, MD   8.6 mg at 08/31/21 0805   simvastatin (ZOCOR) tablet 10 mg  10 mg Oral QHS Swinteck, Aaron Edelman, MD   10 mg at 08/30/21 2037   umeclidinium bromide (INCRUSE ELLIPTA) 62.5 MCG/ACT 1 puff  1 puff Inhalation Daily Rod Can, MD   1 puff at 08/31/21 0809     Discharge Medications: Please see discharge summary for a list of discharge medications.  Relevant Imaging Results:  Relevant Lab Results:   Additional Information SSN: [id number]  Sherie Don, LCSW

## 2021-08-31 NOTE — Plan of Care (Signed)
  Problem: Education: Goal: Knowledge of General Education information will improve Description: Including pain rating scale, medication(s)/side effects and non-pharmacologic comfort measures Outcome: Progressing   Problem: Activity: Goal: Risk for activity intolerance will decrease Outcome: Progressing   Problem: Pain Managment: Goal: General experience of comfort will improve Outcome: Progressing   

## 2021-08-31 NOTE — Evaluation (Signed)
Occupational Therapy Evaluation Patient Details Name: [name] MRN: [id number] DOB: 08-10-1932 Today's Date: 08/31/2021   History of Present Illness 88 year old female with a history of emphysema, hypertension, seizures, presents after a mechanical fall where she suffered a left hip fracture.  She did not have any syncopal event/loss of consciousness or head trauma.  She was seen by orthopedics and underwent operative management on 5/25.   Clinical Impression   [name] is an 88 year old woman s/p hip fracture repair with decreased ROM and strength of RLE, impaired balance, decreased activity tolerance and intermittent complaints of pain resulting in a sudden decline in functional mobility and ability to perform independent ADLs. Patient mod x 2 to stand and transfer and needing total assist for toileting and LB dressing. Patient will benefit from skilled OT services while in hospital to improve deficits and learn compensatory strategies as needed in order to return to PLOF.         Recommendations for follow up therapy are one component of a multi-disciplinary discharge planning process, led by the attending physician.  Recommendations may be updated based on patient status, additional functional criteria and insurance authorization.   Follow Up Recommendations  Skilled nursing-short term rehab (<3 hours/day)    Assistance Recommended at Discharge Frequent or constant Supervision/Assistance  Patient can return home with the following A lot of help with walking and/or transfers;A lot of help with bathing/dressing/bathroom;Assistance with cooking/housework;Help with stairs or ramp for entrance;Assist for transportation    Functional Status Assessment  Patient has had a recent decline in their functional status and demonstrates the ability to make significant improvements in function in a reasonable and predictable amount of time.  Equipment Recommendations  None recommended by  OT    Recommendations for Other Services       Precautions / Restrictions Precautions Precautions: Fall Restrictions Weight Bearing Restrictions: No LLE Weight Bearing: Weight bearing as tolerated      Mobility Bed Mobility Overal bed mobility: Needs Assistance Bed Mobility: Supine to Sit     Supine to sit: Mod assist, +2 for physical assistance          Transfers Overall transfer level: Needs assistance Equipment used: Rolling walker (2 wheels) Transfers: Sit to/from Stand, Bed to chair/wheelchair/BSC Sit to Stand: Mod assist, +2 physical assistance, From elevated surface                  Balance Overall balance assessment: Needs assistance Sitting-balance support: No upper extremity supported, Feet supported Sitting balance-Leahy Scale: Fair     Standing balance support: During functional activity, Reliant on assistive device for balance Standing balance-Leahy Scale: Poor                             ADL either performed or assessed with clinical judgement   ADL Overall ADL's : Needs assistance/impaired Eating/Feeding: Sitting;Independent   Grooming: Set up;Standing   Upper Body Bathing: Set up;Sitting   Lower Body Bathing: Maximal assistance;Sit to/from stand   Upper Body Dressing : Set up;Sitting   Lower Body Dressing: Maximal assistance;+2 for physical assistance;Sit to/from stand   Toilet Transfer: Moderate assistance;+2 for physical assistance   Toileting- Clothing Manipulation and Hygiene: Total assistance;Sit to/from stand;+2 for physical assistance       Functional mobility during ADLs: Moderate assistance;+2 for physical assistance       Vision Baseline Vision/History: 1 Wears glasses       Perception  Praxis      Pertinent Vitals/Pain Pain Assessment Pain Assessment: Faces Faces Pain Scale: Hurts little more Pain Location: L hip Pain Descriptors / Indicators: Grimacing, Guarding, Sore Pain  Intervention(s): Limited activity within patient's tolerance     Hand Dominance Right   Extremity/Trunk Assessment Upper Extremity Assessment Upper Extremity Assessment: Overall WFL for tasks assessed   Lower Extremity Assessment Lower Extremity Assessment: Defer to PT evaluation   Cervical / Trunk Assessment Cervical / Trunk Assessment: Kyphotic   Communication Communication Communication: No difficulties   Cognition Arousal/Alertness: Awake/alert Behavior During Therapy: WFL for tasks assessed/performed Overall Cognitive Status: Within Functional Limits for tasks assessed                                       General Comments       Exercises     Shoulder Instructions      Home Living Family/patient expects to be discharged to:: Skilled nursing facility                                 Additional Comments: [facility] - SNF      Prior Functioning/Environment Prior Level of Function : Independent/Modified Independent             Mobility Comments: uses RW ADLs Comments: assistance with baths, otherwise reports she does her dressing and toileting        OT Problem List: Decreased strength;Decreased range of motion;Decreased activity tolerance;Impaired balance (sitting and/or standing);Decreased knowledge of use of DME or AE;Pain      OT Treatment/Interventions: Self-care/ADL training;Therapeutic exercise;DME and/or AE instruction;Therapeutic activities;Balance training;Patient/family education    OT Goals(Current goals can be found in the care plan section) Acute Rehab OT Goals Patient Stated Goal: to be better OT Goal Formulation: With patient Time For Goal Achievement: 09/14/21 Potential to Achieve Goals: Good  OT Frequency: Min 2X/week    Co-evaluation              AM-PAC OT "6 Clicks" Daily Activity     Outcome Measure Help from another person eating meals?: None Help from another person taking care of  personal grooming?: A Little Help from another person toileting, which includes using toliet, bedpan, or urinal?: Total Help from another person bathing (including washing, rinsing, drying)?: A Lot Help from another person to put on and taking off regular upper body clothing?: A Little Help from another person to put on and taking off regular lower body clothing?: Total 6 Click Score: 14   End of Session Equipment Utilized During Treatment: Gait belt;Rolling walker (2 wheels) Nurse Communication: Mobility status  Activity Tolerance: Patient tolerated treatment well Patient left: in chair;with call bell/phone within reach;with chair alarm set  OT Visit Diagnosis: Other abnormalities of gait and mobility (R26.89);Pain                Time: 3419-3790 OT Time Calculation (min): 24 min Charges:  OT General Charges $OT Visit: 1 Visit OT Evaluation $OT Eval Low Complexity: 1 Low  Elmon Shader, OTR/L [place]  Office [phone] Pager: [place] 08/31/2021, 12:12 PM

## 2021-08-31 NOTE — Progress Notes (Signed)
Physical Therapy Treatment Patient Details Name: [name] MRN: [id number] DOB: 17-Sep-1932 Today's Date: 08/31/2021   History of Present Illness 88 year old female with a history of emphysema, hypertension, seizures, presents after a mechanical fall where she suffered a left hip fracture.  She did not have any syncopal event/loss of consciousness or head trauma.  She was seen by orthopedics and underwent operative management on 5/25.    PT Comments    Pt continues very cooperative but requiring increased time and significant assist of two for all mobility tasks.  This pm, pt up to stand/pvt with RW but unable to complete step back and bed pulled behind pt to return to sitting.  Pt assisted to bed and rolling side to side to position for comfort.   Recommendations for follow up therapy are one component of a multi-disciplinary discharge planning process, led by the attending physician.  Recommendations may be updated based on patient status, additional functional criteria and insurance authorization.  Follow Up Recommendations  Skilled nursing-short term rehab (<3 hours/day)     Assistance Recommended at Discharge Frequent or constant Supervision/Assistance  Patient can return home with the following A lot of help with walking and/or transfers;Two people to help with walking and/or transfers;A little help with bathing/dressing/bathroom;Assistance with cooking/housework;Assist for transportation;Help with stairs or ramp for entrance   Equipment Recommendations  None recommended by PT    Recommendations for Other Services       Precautions / Restrictions Precautions Precautions: Fall Restrictions Weight Bearing Restrictions: No LLE Weight Bearing: Weight bearing as tolerated     Mobility  Bed Mobility Overal bed mobility: Needs Assistance Bed Mobility: Sit to Supine, Rolling Rolling: Mod assist   Supine to sit: Mod assist, +2 for physical assistance Sit to supine: Mod  assist, Max assist, +2 for physical assistance, +2 for safety/equipment   General bed mobility comments: assist for LEs, to control trunk and to complete rotation into bed using pad    Transfers Overall transfer level: Needs assistance Equipment used: Rolling walker (2 wheels) Transfers: Sit to/from Stand, Bed to chair/wheelchair/BSC Sit to Stand: Mod assist, +2 physical assistance, From elevated surface Stand pivot transfers: Mod assist, Max assist, +2 physical assistance, From elevated surface Step pivot transfers: Mod assist, +2 physical assistance, +2 safety/equipment       General transfer comment: Pt assisted  up to RW and pvt on R LE but unalbe to WB sufficiently on UEs or operative leg to step.  Bed pulled up behind pt to allow return to sitting    Ambulation/Gait               General Gait Details: stand pvt transfers only with pt tolerating minimal wt on L LE and with difficulty offloading with UEs   Stairs             Wheelchair Mobility    Modified Rankin (Stroke Patients Only)       Balance Overall balance assessment: Needs assistance Sitting-balance support: No upper extremity supported, Feet supported Sitting balance-Leahy Scale: Fair     Standing balance support: During functional activity, Reliant on assistive device for balance Standing balance-Leahy Scale: Poor                              Cognition Arousal/Alertness: Awake/alert Behavior During Therapy: WFL for tasks assessed/performed Overall Cognitive Status: Within Functional Limits for tasks assessed  Exercises General Exercises - Lower Extremity Ankle Circles/Pumps: AROM, Both, 15 reps, Supine    General Comments        Pertinent Vitals/Pain Pain Assessment Pain Assessment: 0-10 Pain Score: 8  Faces Pain Scale: Hurts little more Pain Location: L hip with activity Pain Descriptors / Indicators:  Grimacing, Guarding, Sore Pain Intervention(s): Limited activity within patient's tolerance, Monitored during session, Premedicated before session    Home Living Family/patient expects to be discharged to:: Skilled nursing facility                   Additional Comments: [facility] - SNF    Prior Function            PT Goals (current goals can now be found in the care plan section) Acute Rehab PT Goals Patient Stated Goal: Regain IND PT Goal Formulation: With patient Time For Goal Achievement: 09/14/21 Potential to Achieve Goals: Fair Progress towards PT goals: Progressing toward goals    Frequency    Min 2X/week      PT Plan Current plan remains appropriate    Co-evaluation              AM-PAC PT "6 Clicks" Mobility   Outcome Measure  Help needed turning from your back to your side while in a flat bed without using bedrails?: A Lot Help needed moving from lying on your back to sitting on the side of a flat bed without using bedrails?: A Lot Help needed moving to and from a bed to a chair (including a wheelchair)?: A Lot Help needed standing up from a chair using your arms (e.g., wheelchair or bedside chair)?: A Lot Help needed to walk in hospital room?: Total Help needed climbing 3-5 steps with a railing? : Total 6 Click Score: 10    End of Session Equipment Utilized During Treatment: Gait belt Activity Tolerance: Patient tolerated treatment well;Patient limited by fatigue Patient left: in bed;with call bell/phone within reach;with bed alarm set Nurse Communication: Mobility status PT Visit Diagnosis: Unsteadiness on feet (R26.81);Difficulty in walking, not elsewhere classified (R26.2);Muscle weakness (generalized) (M62.81);History of falling (Z91.81);Pain Pain - Right/Left: Left Pain - part of body: Hip     Time: 2446-2863 PT Time Calculation (min) (ACUTE ONLY): 27 min  Charges:  $Therapeutic Activity: 23-37 mins                     Debe Coder PT Acute Rehabilitation Services Pager [phone] Office [phone]    Aysia Lowder 08/31/2021, 3:18 PM

## 2021-08-31 NOTE — Evaluation (Signed)
Physical Therapy Evaluation Patient Details Name: [name] MRN: [id number] DOB: Dec 27, 1932 Today's Date: 08/31/2021  History of Present Illness  88 year old female with a history of emphysema, hypertension, seizures, presents after a mechanical fall where she suffered a left hip fracture.  She did not have any syncopal event/loss of consciousness or head trauma.  She was seen by orthopedics and underwent operative management on 5/25.  Clinical Impression    Pt s/p IM nailing for L hip fx and presents with functional mobility limitations 2* decreased L LE strength/ROM, post op pain, balance deficits, and premorbid deconditioning.  Pt currently requires significant assist of 2 for all basic mobility tasks and would benefit from follow up SNF level rehab to maximize IND and safety.     Recommendations for follow up therapy are one component of a multi-disciplinary discharge planning process, led by the attending physician.  Recommendations may be updated based on patient status, additional functional criteria and insurance authorization.  Follow Up Recommendations Skilled nursing-short term rehab (<3 hours/day)    Assistance Recommended at Discharge Frequent or constant Supervision/Assistance  Patient can return home with the following  A lot of help with walking and/or transfers;Two people to help with walking and/or transfers;A little help with bathing/dressing/bathroom;Assistance with cooking/housework;Assist for transportation;Help with stairs or ramp for entrance    Equipment Recommendations None recommended by PT  Recommendations for Other Services       Functional Status Assessment Patient has had a recent decline in their functional status and demonstrates the ability to make significant improvements in function in a reasonable and predictable amount of time.     Precautions / Restrictions Precautions Precautions: Fall Restrictions Weight Bearing Restrictions: No LLE Weight  Bearing: Weight bearing as tolerated      Mobility  Bed Mobility Overal bed mobility: Needs Assistance Bed Mobility: Supine to Sit     Supine to sit: Mod assist, +2 for physical assistance     General bed mobility comments: assist with pad to complete rotation to EOB sitting and to bring trunk to upright    Transfers Overall transfer level: Needs assistance Equipment used: Rolling walker (2 wheels) Transfers: Sit to/from Stand, Bed to chair/wheelchair/BSC Sit to Stand: Mod assist, +2 physical assistance, From elevated surface   Step pivot transfers: Mod assist, +2 physical assistance, +2 safety/equipment       General transfer comment: step pvt bed to [facility] and BSC to recliner    Ambulation/Gait               General Gait Details: step pvt transfers only with pt tolerating minimal wt on L LE and with difficulty offloading with UEs  Stairs            Wheelchair Mobility    Modified Rankin (Stroke Patients Only)       Balance                                             Pertinent Vitals/Pain Pain Assessment Pain Assessment: Faces Faces Pain Scale: Hurts little more Pain Location: L hip Pain Descriptors / Indicators: Grimacing, Guarding, Sore Pain Intervention(s): Limited activity within patient's tolerance    Home Living Family/patient expects to be discharged to:: Skilled nursing facility                   Additional Comments: [facility] - SNF  Prior Function Prior Level of Function : Independent/Modified Independent             Mobility Comments: uses rollator ADLs Comments: assistance with baths, otherwise reports she does her dressing and toileting     Hand Dominance   Dominant Hand: Right    Extremity/Trunk Assessment   Upper Extremity Assessment Upper Extremity Assessment: Defer to OT evaluation    Lower Extremity Assessment Lower Extremity Assessment: LLE deficits/detail;Generalized  weakness    Cervical / Trunk Assessment Cervical / Trunk Assessment: Kyphotic  Communication   Communication: No difficulties  Cognition Arousal/Alertness: Awake/alert Behavior During Therapy: WFL for tasks assessed/performed Overall Cognitive Status: Within Functional Limits for tasks assessed                                          General Comments      Exercises General Exercises - Lower Extremity Ankle Circles/Pumps: AROM, Both, 15 reps, Supine   Assessment/Plan    PT Assessment Patient needs continued PT services  PT Problem List Decreased strength;Decreased range of motion;Decreased activity tolerance;Decreased balance;Decreased mobility;Decreased cognition;Pain;Decreased knowledge of use of DME       PT Treatment Interventions DME instruction;Gait training;Stair training;Functional mobility training;Therapeutic activities;Therapeutic exercise;Balance training;Patient/family education    PT Goals (Current goals can be found in the Care Plan section)  Acute Rehab PT Goals Patient Stated Goal: Regain IND PT Goal Formulation: With patient Time For Goal Achievement: 09/14/21 Potential to Achieve Goals: Fair    Frequency Min 2X/week     Co-evaluation               AM-PAC PT "6 Clicks" Mobility  Outcome Measure Help needed turning from your back to your side while in a flat bed without using bedrails?: A Lot Help needed moving from lying on your back to sitting on the side of a flat bed without using bedrails?: A Lot Help needed moving to and from a bed to a chair (including a wheelchair)?: A Lot Help needed standing up from a chair using your arms (e.g., wheelchair or bedside chair)?: A Lot Help needed to walk in hospital room?: Total Help needed climbing 3-5 steps with a railing? : Total 6 Click Score: 10    End of Session Equipment Utilized During Treatment: Gait belt Activity Tolerance: Patient tolerated treatment well;Patient limited  by fatigue Patient left: in chair;with call bell/phone within reach;with chair alarm set Nurse Communication: Mobility status PT Visit Diagnosis: Unsteadiness on feet (R26.81);Difficulty in walking, not elsewhere classified (R26.2);Muscle weakness (generalized) (M62.81);History of falling (Z91.81);Pain Pain - Right/Left: Left Pain - part of body: Hip    Time: 1224-4975 PT Time Calculation (min) (ACUTE ONLY): 32 min   Charges:   PT Evaluation $PT Eval Low Complexity: 1 Low          [place] Pager [phone] Office [phone]   Adisyn Ruscitti 08/31/2021, 12:54 PM

## 2021-09-01 DIAGNOSIS — S72002A Fracture of unspecified part of neck of left femur, initial encounter for closed fracture: Secondary | ICD-10-CM | POA: Diagnosis not present

## 2021-09-01 DIAGNOSIS — K219 Gastro-esophageal reflux disease without esophagitis: Secondary | ICD-10-CM | POA: Diagnosis not present

## 2021-09-01 DIAGNOSIS — W19XXXA Unspecified fall, initial encounter: Secondary | ICD-10-CM | POA: Diagnosis not present

## 2021-09-01 DIAGNOSIS — I10 Essential (primary) hypertension: Secondary | ICD-10-CM | POA: Diagnosis not present

## 2021-09-01 LAB — BASIC METABOLIC PANEL
Anion gap: 5 (ref 5–15)
BUN: 23 mg/dL (ref 8–23)
CO2: 28 mmol/L (ref 22–32)
Calcium: 8.7 mg/dL — ABNORMAL LOW (ref 8.9–10.3)
Chloride: 106 mmol/L (ref 98–111)
Creatinine, Ser: 0.83 mg/dL (ref 0.44–1.00)
GFR, Estimated: >60 mL/min (ref >60)
Glucose, Bld: 99 mg/dL (ref 70–99)
Potassium: 3.3 mmol/L — ABNORMAL LOW (ref 3.5–5.1)
Sodium: 139 mmol/L (ref 135–145)

## 2021-09-01 LAB — CBC
HCT: 31.1 % — ABNORMAL LOW (ref 36.0–46.0)
Hemoglobin: 9.4 g/dL — ABNORMAL LOW (ref 12.0–15.0)
MCH: 26 pg (ref 26.0–34.0)
MCHC: 30.2 g/dL (ref 30.0–36.0)
MCV: 86.1 fL (ref 80.0–100.0)
Platelets: 163 10*3/uL (ref 150–400)
RBC: 3.61 MIL/uL — ABNORMAL LOW (ref 3.87–5.11)
RDW: 16 % — ABNORMAL HIGH (ref 11.5–15.5)
WBC: 6.5 10*3/uL (ref 4.0–10.5)
nRBC: 0 % (ref 0.0–0.2)

## 2021-09-01 MED ORDER — POTASSIUM CHLORIDE CRYS ER 20 MEQ PO TBCR
40.0000 meq | EXTENDED_RELEASE_TABLET | Freq: Once | ORAL | Status: AC
  Administered 2021-09-01: 40 meq via ORAL
  Filled 2021-09-01: qty 2

## 2021-09-01 NOTE — Progress Notes (Signed)
Physical Therapy Treatment Patient Details Name: [name] MRN: [id number] DOB: 04-Oct-1932 Today's Date: 09/01/2021   History of Present Illness 88 year old female with a history of emphysema, hypertension, seizures, presents after a mechanical fall where she suffered a left hip fracture.  She did not have any syncopal event/loss of consciousness or head trauma.  She was seen by orthopedics and underwent operative management on 5/25.    PT Comments    Pt progressing slowly. Fatigues easily, requiring +2 assist for bed mobility and transfers.  Dyspneic with activity however SpO2=91-95% on RA. Continue to recommend SNF post acute   Recommendations for follow up therapy are one component of a multi-disciplinary discharge planning process, led by the attending physician.  Recommendations may be updated based on patient status, additional functional criteria and insurance authorization.  Follow Up Recommendations  Skilled nursing-short term rehab (<3 hours/day)     Assistance Recommended at Discharge Frequent or constant Supervision/Assistance  Patient can return home with the following Two people to help with walking and/or transfers;A little help with bathing/dressing/bathroom;Assistance with cooking/housework;Assist for transportation;Help with stairs or ramp for entrance   Equipment Recommendations  None recommended by PT    Recommendations for Other Services       Precautions / Restrictions Precautions Precautions: Fall Restrictions Weight Bearing Restrictions: No LLE Weight Bearing: Weight bearing as tolerated     Mobility  Bed Mobility Overal bed mobility: Needs Assistance Bed Mobility: Sit to Supine, Supine to Sit     Supine to sit: Mod assist, Max assist, +2 for physical assistance, +2 for safety/equipment Sit to supine: Max assist, +2 for physical assistance, +2 for safety/equipment, Total assist   General bed mobility comments: assist for LEs, to control trunk  and to complete rotation into bed using pad; assist for descent of trunk and to lift bil LEs on to bed    Transfers Overall transfer level: Needs assistance Equipment used: Rolling walker (2 wheels) Transfers: Sit to/from Stand Sit to Stand: Mod assist, +2 physical assistance, From elevated surface, Max assist           General transfer comment: STS x2 from elevated bed. +2 mod-max assit for anterior superior wt shift, to assist with bringing COG over BOS. pt requires multi-modal cues for hip, trunk extension as tolerated. able to bear very little wt on LLE d/t pain and weakness. maintains L knee in flexion    Ambulation/Gait                   Stairs             Wheelchair Mobility    Modified Rankin (Stroke Patients Only)       Balance Overall balance assessment: Needs assistance Sitting-balance support: No upper extremity supported, Feet supported Sitting balance-Leahy Scale: Poor Sitting balance - Comments: LOB to right and posterior, cues and intermittent assist to maintain midline Postural control: Posterior lean, Right lateral lean Standing balance support: During functional activity, Reliant on assistive device for balance Standing balance-Leahy Scale: Zero                              Cognition Arousal/Alertness: Awake/alert Behavior During Therapy: WFL for tasks assessed/performed Overall Cognitive Status: Within Functional Limits for tasks assessed  Exercises      General Comments        Pertinent Vitals/Pain Pain Assessment Pain Assessment: Faces Faces Pain Scale: Hurts even more Pain Location: L hip with activity Pain Descriptors / Indicators: Grimacing, Guarding, Sore Pain Intervention(s): Limited activity within patient's tolerance, Monitored during session, Repositioned    Home Living                          Prior Function            PT Goals  (current goals can now be found in the care plan section) Acute Rehab PT Goals Patient Stated Goal: Regain IND PT Goal Formulation: With patient Time For Goal Achievement: 09/14/21 Potential to Achieve Goals: Fair Progress towards PT goals: Progressing toward goals (slowly)    Frequency    Min 2X/week (from SNF)      PT Plan Current plan remains appropriate    Co-evaluation              AM-PAC PT "6 Clicks" Mobility   Outcome Measure  Help needed turning from your back to your side while in a flat bed without using bedrails?: Total Help needed moving from lying on your back to sitting on the side of a flat bed without using bedrails?: Total Help needed moving to and from a bed to a chair (including a wheelchair)?: Total Help needed standing up from a chair using your arms (e.g., wheelchair or bedside chair)?: Total Help needed to walk in hospital room?: Total Help needed climbing 3-5 steps with a railing? : Total 6 Click Score: 6    End of Session Equipment Utilized During Treatment: Gait belt Activity Tolerance: Patient limited by fatigue Patient left: in bed;with call bell/phone within reach;with bed alarm set Nurse Communication: Mobility status PT Visit Diagnosis: Unsteadiness on feet (R26.81);Difficulty in walking, not elsewhere classified (R26.2);Muscle weakness (generalized) (M62.81);History of falling (Z91.81);Pain Pain - Right/Left: Left Pain - part of body: Hip     Time: 4403-4742 PT Time Calculation (min) (ACUTE ONLY): 26 min  Charges:  $Therapeutic Activity: 23-37 mins                     Baxter Flattery, PT  Acute Rehab Dept ([facility]) [phone] Pager [phone]  09/01/2021    [facility] 09/01/2021, 3:30 PM

## 2021-09-01 NOTE — Progress Notes (Signed)
    Subjective:  Patient reports pain as mild to moderate.  Denies N/V/CP/SOB/Abd pain/ Dizziness. Patient states that she continues to have some pain in her leg but it is managed. She says that she is having some swelling in her knee. She denies tingling and numbness in her LE bilaterally.   Objective:   VITALS:   Vitals:   08/31/21 2010 09/01/21 0548 09/01/21 0916 09/01/21 0917  BP: 113/74 (!) 147/81 (!) 144/86 (!) 144/86  Pulse: 100 93  91  Resp: $Remo'16 18  18  'MRsab$ Temp: 98 F (36.7 C) 97.8 F (36.6 C)  98.2 F (36.8 C)  TempSrc: Oral   Oral  SpO2: 97% 96%    Weight:      Height:        Patient is sitting in her recliner. NAD.  ABD soft Neurovascular intact Sensation intact distally Intact pulses distally Dorsiflexion/Plantar flexion intact No cellulitis present Compartment soft Mepilex dressings C/D/I.  She has vascular changes noted in lower legs likely from chronic venous insufficiency. Mild expected edema in left leg.  Vertical scar over left knee consistent with TKA.   Lab Results  Component Value Date   WBC 6.5 09/01/2021   HGB 9.4 (L) 09/01/2021   HCT 31.1 (L) 09/01/2021   MCV 86.1 09/01/2021   PLT 163 09/01/2021   BMET    Component Value Date/Time   NA 139 09/01/2021 0307   NA 141 05/22/2021 0000   NA 143 06/22/2014 1034   K 3.3 (L) 09/01/2021 0307   K 5.0 06/22/2014 1034   CL 106 09/01/2021 0307   CO2 28 09/01/2021 0307   CO2 28 06/22/2014 1034   GLUCOSE 99 09/01/2021 0307   GLUCOSE 82 06/22/2014 1034   BUN 23 09/01/2021 0307   BUN 23 (A) 05/22/2021 0000   BUN 29.6 (H) 06/22/2014 1034   CREATININE 0.83 09/01/2021 0307   CREATININE 1.02 (H) 06/27/2015 1055   CREATININE 0.8 06/22/2014 1034   CALCIUM 8.7 (L) 09/01/2021 0307   CALCIUM 9.7 06/22/2014 1034   EGFR 64 05/22/2021 0000   GFRNONAA >60 09/01/2021 3235     Assessment/Plan: 2 Days Post-Op   Principal Problem:   Closed left hip fracture (HCC) Active Problems:   Restless legs syndrome  (RLS)   Essential hypertension   Seizure disorder (HCC)   Hyperlipidemia   Senile dementia ([place])   Fall at home, initial encounter   Panlobular emphysema (HCC)   GERD (gastroesophageal reflux disease)   Heart block AV first degree  S/p Intramedullary fixation, Left femur from intertrochanteric femur fracture.    WBAT with walker DVT ppx: Lovenox while in hospital will switch to Aspirin $RemoveBe'81mg'MCghNVFqW$  BID upon discharge. , SCDs, TEDS PO pain control PT/OT: Patient was limited in therapy due to pain and deconditioning. Continue to work with PT.  Dispo: D/c likely back to SNF. She is WBAT on LLE. Hospitalist to d/c when medically ready. Continue to work with PT while in hospital. Rx for pain medication and DVT ppx printed in chart.    Armond Hang MD

## 2021-09-01 NOTE — Plan of Care (Signed)
  Problem: Clinical Measurements: Goal: Will remain free from infection Outcome: Progressing   Problem: Activity: Goal: Risk for activity intolerance will decrease Outcome: Progressing   Problem: Nutrition: Goal: Adequate nutrition will be maintained Outcome: Progressing   Problem: Elimination: Goal: Will not experience complications related to bowel motility Outcome: Progressing Goal: Will not experience complications related to urinary retention Outcome: Progressing   Problem: Pain Managment: Goal: General experience of comfort will improve Outcome: Progressing   Problem: Safety: Goal: Ability to remain free from injury will improve Outcome: Progressing

## 2021-09-01 NOTE — Progress Notes (Signed)
PROGRESS NOTE    [name]  ZJQ:[id number] DOB: Jun 08, 1932 DOA: 08/29/2021 PCP: [name], MD    Brief Narrative:  88 year old female with a history of emphysema, hypertension, seizures, presents after a mechanical fall where she suffered a left hip fracture.  She did not have any syncopal event/loss of consciousness or head trauma.  She was seen by orthopedics and underwent operative management on 5/25.   Assessment & Plan:   Principal Problem:   Closed left hip fracture ([place]) Active Problems:   Senile dementia (HCC)   Restless legs syndrome (RLS)   Essential hypertension   Seizure disorder (HCC)   Hyperlipidemia   Fall at home, initial encounter   Panlobular emphysema ([place])   GERD (gastroesophageal reflux disease)   Heart block AV first degree   Mechanical fall Left hip fracture -Seen by orthopedics and underwent operative management on 5/25 -PT/OT -Pain management -Awaiting on insurance authorization to return to skilled facility  Hypertension -On amlodipine -Blood pressure stable  Seizure disorder -Continue Keppra  GERD -Continue PPI  COPD -No wheezing at present -Continue bronchodilators  Hyperlipidemia -Continue statin  Restless leg syndrome -Continue on Mirapex bedtime  Hypokalemia -Replace  Anemia -Likely related to blood loss during surgery -Hemodynamically stable -Continue to monitor   DVT prophylaxis: enoxaparin (LOVENOX) injection 30 mg Start: 08/31/21 0800 SCDs Start: 08/30/21 1833  Code Status: DNR Family Communication: No family present.  Tried calling patient's sister, POA, but unable to reach her over the phone Disposition Plan: Status is: Inpatient Remains inpatient appropriate because: Continued postop care for hip surgery     Consultants:  Orthopedics  Procedures:  5/25 left hip intramedullary nail  Antimicrobials:      Subjective: Continues to have pain in her hip.  No shortness of breath or other  complaints.  Objective: Vitals:   09/01/21 0548 09/01/21 0916 09/01/21 0917 09/01/21 1338  BP: (!) 147/81 (!) 144/86 (!) 144/86 128/77  Pulse: 93  91 92  Resp: '18  18 18  '$ Temp: 97.8 F (36.6 C)  98.2 F (36.8 C) 98.1 F (36.7 C)  TempSrc:   Oral Oral  SpO2: 96%  95% 94%  Weight:      Height:        Intake/Output Summary (Last 24 hours) at 09/01/2021 1530 Last data filed at 09/01/2021 1519 Gross per 24 hour  Intake 2819.24 ml  Output 400 ml  Net 2419.24 ml   Filed Weights   08/29/21 1548  Weight: 74.3 kg    Examination:  General exam: Appears calm and comfortable  Respiratory system: Clear to auscultation. Respiratory effort normal. Cardiovascular system: S1 & S2 heard, RRR. No JVD, murmurs, rubs, gallops or clicks. No pedal edema. Gastrointestinal system: Abdomen is nondistended, soft and nontender. No organomegaly or masses felt. Normal bowel sounds heard. Central nervous system: Alert and oriented. No focal neurological deficits. Extremities: Symmetric 5 x 5 power. Skin: No rashes, lesions or ulcers Psychiatry: Judgement and insight appear normal. Mood & affect appropriate.     Data Reviewed: I have personally reviewed following labs and imaging studies  CBC: Recent Labs  Lab 08/29/21 0930 08/30/21 0323 08/31/21 0332 09/01/21 0307  WBC 3.9* 6.4 7.5 6.5  NEUTROABS 2.3  --   --   --   HGB 12.6 11.5* 10.7* 9.4*  HCT 39.9 38.2 35.3* 31.1*  MCV 84.2 86.6 87.2 86.1  PLT 218 190 190 240   Basic Metabolic Panel: Recent Labs  Lab 08/29/21 0930 08/30/21 0323 08/31/21  3570 09/01/21 0307  NA 141 143 143 139  K 3.7 3.8 3.7 3.3*  CL 107 113* 109 106  CO2 '27 26 28 28  '$ GLUCOSE 94 88 120* 99  BUN 24* '14 15 23  '$ CREATININE 0.90 0.88 0.76 0.83  CALCIUM 9.5 8.7* 8.8* 8.7*   GFR: Estimated Creatinine Clearance: 45.9 mL/min (by C-G formula based on SCr of 0.83 mg/dL). Liver Function Tests: No results for input(s): AST, ALT, ALKPHOS, BILITOT, PROT, ALBUMIN in the  last 168 hours. No results for input(s): LIPASE, AMYLASE in the last 168 hours. No results for input(s): AMMONIA in the last 168 hours. Coagulation Profile: Recent Labs  Lab 08/29/21 0930  INR 1.0   Cardiac Enzymes: No results for input(s): CKTOTAL, CKMB, CKMBINDEX, TROPONINI in the last 168 hours. BNP (last 3 results) No results for input(s): PROBNP in the last 8760 hours. HbA1C: No results for input(s): HGBA1C in the last 72 hours. CBG: No results for input(s): GLUCAP in the last 168 hours. Lipid Profile: No results for input(s): CHOL, HDL, LDLCALC, TRIG, CHOLHDL, LDLDIRECT in the last 72 hours. Thyroid Function Tests: No results for input(s): TSH, T4TOTAL, FREET4, T3FREE, THYROIDAB in the last 72 hours. Anemia Panel: No results for input(s): VITAMINB12, FOLATE, FERRITIN, TIBC, IRON, RETICCTPCT in the last 72 hours. Sepsis Labs: No results for input(s): PROCALCITON, LATICACIDVEN in the last 168 hours.  Recent Results (from the past 240 hour(s))  Surgical pcr screen     Status: None   Collection Time: 08/29/21  6:31 PM   Specimen: Nasal Mucosa; Nasal Swab  Result Value Ref Range Status   MRSA, PCR NEGATIVE NEGATIVE Final   Staphylococcus aureus NEGATIVE NEGATIVE Final    Comment: (NOTE) The Xpert SA Assay (FDA approved for NASAL specimens in patients 65 years of age and older), is one component of a comprehensive surveillance program. It is not intended to diagnose infection nor to guide or monitor treatment. Performed at [facility], [place] [address] [place], [place] [address]          Radiology Studies: Pelvis Portable  Result Date: 08/30/2021 CLINICAL DATA:  Status post left femur ORIF EXAM: PORTABLE PELVIS 1-2 VIEWS COMPARISON:  08/29/2021 FINDINGS: Status post gamma nail fixation of intertrochanteric/basicervical femur fracture. Components appear well positioned. Lesser trochanter is displaced relative to the preoperative imaging. IMPRESSION:  Status post gamma nail fixation. Good position and alignment, with exception that the lesser trochanter is now displaced from its original position. Electronically Signed   By: Nelson Chimes M.D.   On: 08/30/2021 17:52   DG C-Arm 1-60 Min-No Report  Result Date: 08/30/2021 Fluoroscopy was utilized by the requesting physician.  No radiographic interpretation.   DG HIP UNILAT WITH PELVIS 1V LEFT  Result Date: 08/30/2021 CLINICAL DATA:  Left IM nail EXAM: DG HIP (WITH OR WITHOUT PELVIS) 1V*L*; DG C-ARM 1-60 MIN-NO REPORT COMPARISON:  CT left hip dated 08/29/2021 Fluoroscopy time: 1 minute 8 seconds 16.98 mGy FINDINGS: IM nail with dynamic hip screw fixation of an intertrochanteric left hip fracture. Fracture fragments are in near anatomic alignment and position. IMPRESSION: Status post ORIF of a left hip fracture, as above. Electronically Signed   By: Julian Hy M.D.   On: 08/30/2021 20:09        Scheduled Meds:  amLODipine  2.5 mg Oral Daily   aspirin  81 mg Oral Daily   cholecalciferol  1,000 Units Oral Daily   docusate sodium  100 mg Oral BID   enoxaparin (  LOVENOX) injection  30 mg Subcutaneous Q24H   feeding supplement  237 mL Oral BID BM   furosemide  20 mg Oral Daily   lamoTRIgine  150 mg Oral BID   levETIRAcetam  250 mg Oral Daily   levETIRAcetam  500 mg Oral QHS   multivitamin with minerals  1 tablet Oral Daily   pantoprazole  40 mg Oral Daily   potassium chloride  20 mEq Oral Daily   pramipexole  0.5 mg Oral QHS   senna  1 tablet Oral BID   simvastatin  10 mg Oral QHS   umeclidinium bromide  1 puff Inhalation Daily   Continuous Infusions:  sodium chloride 20 mL/hr at 09/01/21 1519     LOS: 3 days    Time spent: 1mns    JKathie Dike MD [facility]   If 7PM-7AM, please contact night-coverage [email or web address]  09/01/2021, 3:30 PM

## 2021-09-01 NOTE — Plan of Care (Signed)
  Problem: Education: Goal: Knowledge of General Education information will improve Description Including pain rating scale, medication(s)/side effects and non-pharmacologic comfort measures Outcome: Progressing   Problem: Nutrition: Goal: Adequate nutrition will be maintained Outcome: Progressing   Problem: Pain Managment: Goal: General experience of comfort will improve Outcome: Progressing   

## 2021-09-01 NOTE — TOC Progression Note (Signed)
Transition of Care [facility]) - Progression Note    Patient Details  Name: [name] MRN: [id number] Date of Birth: 08/29/1932  Transition of Care [facility]) CM/SW Contact  Lennart Pall, [place] Phone Number: 09/01/2021, 3:30 PM  Clinical Narrative:    Insurance authorization still pending at this time.   Expected Discharge Plan: [place] Barriers to Discharge: Continued Medical Work up, Ship broker  Expected Discharge Plan and Services Expected Discharge Plan: [place] In-house Referral: Clinical Social Work   Post Acute Care Choice: [place] Living arrangements for the past 2 months: [place]                 DME Arranged: N/A DME Agency: NA                   Social Determinants of Health (SDOH) Interventions    Readmission Risk Interventions     View : No data to display.

## 2021-09-02 DIAGNOSIS — W19XXXA Unspecified fall, initial encounter: Secondary | ICD-10-CM | POA: Diagnosis not present

## 2021-09-02 DIAGNOSIS — K219 Gastro-esophageal reflux disease without esophagitis: Secondary | ICD-10-CM | POA: Diagnosis not present

## 2021-09-02 DIAGNOSIS — S72002A Fracture of unspecified part of neck of left femur, initial encounter for closed fracture: Secondary | ICD-10-CM | POA: Diagnosis not present

## 2021-09-02 DIAGNOSIS — I10 Essential (primary) hypertension: Secondary | ICD-10-CM | POA: Diagnosis not present

## 2021-09-02 LAB — BASIC METABOLIC PANEL
Anion gap: 7 (ref 5–15)
BUN: 21 mg/dL (ref 8–23)
CO2: 27 mmol/L (ref 22–32)
Calcium: 8.5 mg/dL — ABNORMAL LOW (ref 8.9–10.3)
Chloride: 104 mmol/L (ref 98–111)
Creatinine, Ser: 0.78 mg/dL (ref 0.44–1.00)
GFR, Estimated: >60 mL/min (ref >60)
Glucose, Bld: 98 mg/dL (ref 70–99)
Potassium: 3.2 mmol/L — ABNORMAL LOW (ref 3.5–5.1)
Sodium: 138 mmol/L (ref 135–145)

## 2021-09-02 LAB — CBC
HCT: 30.6 % — ABNORMAL LOW (ref 36.0–46.0)
Hemoglobin: 9.4 g/dL — ABNORMAL LOW (ref 12.0–15.0)
MCH: 26.2 pg (ref 26.0–34.0)
MCHC: 30.7 g/dL (ref 30.0–36.0)
MCV: 85.2 fL (ref 80.0–100.0)
Platelets: 190 10*3/uL (ref 150–400)
RBC: 3.59 MIL/uL — ABNORMAL LOW (ref 3.87–5.11)
RDW: 16 % — ABNORMAL HIGH (ref 11.5–15.5)
WBC: 7.8 10*3/uL (ref 4.0–10.5)
nRBC: 0 % (ref 0.0–0.2)

## 2021-09-02 LAB — MAGNESIUM: Magnesium: 2.2 mg/dL (ref 1.7–2.4)

## 2021-09-02 MED ORDER — POTASSIUM CHLORIDE CRYS ER 20 MEQ PO TBCR
40.0000 meq | EXTENDED_RELEASE_TABLET | ORAL | Status: AC
  Administered 2021-09-02 (×2): 40 meq via ORAL
  Filled 2021-09-02 (×2): qty 2

## 2021-09-02 NOTE — Op Note (Signed)
OPERATIVE REPORT  SURGEON: Rod Can, MD   ASSISTANT: Larene Pickett, PA-C.  PREOPERATIVE DIAGNOSIS: Left intertrochanteric femur fracture.   POSTOPERATIVE DIAGNOSIS: Left intertrochanteric femur fracture.   PROCEDURE: Intramedullary fixation, Left femur.   IMPLANTS: Biomet Affixus Hip Fracture Nail, 11 by 180 mm, 125 degrees. 10.5 x 90 mm Hip Fracture Nail Lag Screw. 5 x 34 mm distal interlocking screw 1.  ANESTHESIA:  General  ESTIMATED BLOOD LOSS:-50 mL    ANTIBIOTICS: 2 g Ancef.  DRAINS: None.  COMPLICATIONS: None.   CONDITION: PACU - hemodynamically stable.[name]   BRIEF CLINICAL NOTE: [name] is a 88 y.o. female who presented with an intertrochanteric femur fracture. The patient was admitted to the hospitalist service and underwent perioperative risk stratification and medical optimization. The risks, benefits, and alternatives to the procedure were explained, and the patient elected to proceed.  PROCEDURE IN DETAIL: Surgical site was marked by myself. The patient was taken to the operating room and anesthesia was induced on the bed. The patient was then transferred to the [facility] table and the nonoperative lower extremity was scissored underneath the operative side. The fracture was reduced with traction, internal rotation, and adduction. The hip was prepped and draped in the normal sterile surgical fashion. Timeout was called verifying side and site of surgery. Preop antibiotics were given with 60 minutes of beginning the procedure.  Fluoroscopy was used to define the patient's anatomy. A 4 cm incision was made over the proximal femur, and a bone hook was placed subperiosteally to correct apex anterior deformity. A 4 cm incision was made just proximal to the tip of the greater trochanter. The awl was used to obtain the standard starting point for a trochanteric entry nail under fluoroscopic control. The guidepin was placed. The entry reamer was used to open the proximal  femur.  On the back table, the nail was assembled onto the jig. The nail was placed into the femur without any difficulty. Through a separate stab incision, the cannula was placed down to the bone in preparation for the cephalomedullary device. A guidepin was placed into the femoral head using AP and lateral fluoroscopy views. The pin was measured, and then reaming was performed to the appropriate depth. The lag screw was inserted to the appropriate depth. The fracture was compressed through the jig. The setscrew was tightened and then loosened one quarter turn. A separate stab incision was created, and the distal interlocking screw was placed using standard AO technique. The jig was removed. Final AP and lateral fluoroscopy views were obtained to confirm fracture reduction and hardware placement. Tip apex distance was appropriate. There was no chondral penetration.  The wounds were copiously irrigated with saline. The wound was closed in layers with #1 Vicryl for the fascia, 2-0 Monocryl for the deep dermal layer, and 3-0 Monocryl subcuticular stitch. Glue was applied to the skin. Once the glue was fully hardened, sterile dressing was applied. The patient was then awakened from anesthesia and taken to the PACU in stable condition. Sponge needle and instrument counts were correct at the end of the case 2. There were no known complications.  We will readmit the patient to the hospitalist. Weightbearing status will be weightbearing as tolerated with a walker. We will begin Lovenox for DVT prophylaxis. The patient will work with physical therapy and undergo disposition planning.  Please note that a surgical assistant was a medical necessity for this procedure to perform it in a safe and expeditious manner. Assistant was necessary to provide  appropriate retraction of vital neurovascular structures, to prevent femoral fracture, and to allow for anatomic placement of the prosthesis.

## 2021-09-02 NOTE — Progress Notes (Signed)
PROGRESS NOTE    [name]  ZYS:[id number] DOB: Apr 16, 1932 DOA: 08/29/2021 PCP: [name], MD    Brief Narrative:  88 year old female with a history of emphysema, hypertension, seizures, presents after a mechanical fall where she suffered a left hip fracture.  She did not have any syncopal event/loss of consciousness or head trauma.  She was seen by orthopedics and underwent operative management on 5/25.   Assessment & Plan:   Principal Problem:   Closed left hip fracture ([place]) Active Problems:   Senile dementia (HCC)   Restless legs syndrome (RLS)   Essential hypertension   Seizure disorder (HCC)   Hyperlipidemia   Fall at home, initial encounter   Panlobular emphysema ([place])   GERD (gastroesophageal reflux disease)   Heart block AV first degree   Mechanical fall Left hip fracture -Seen by orthopedics and underwent operative management on 5/25 -PT/OT -Pain management -Awaiting on insurance authorization to return to skilled facility  Hypertension -On amlodipine -Blood pressure stable  Seizure disorder -Continue Keppra  GERD -Continue PPI  COPD -No wheezing at present -Continue bronchodilators  Hyperlipidemia -Continue statin  Restless leg syndrome -Continue on Mirapex bedtime  Hypokalemia -Replace -check Mg  Anemia -Likely related to blood loss during surgery -Hemodynamically stable -Continue to monitor   DVT prophylaxis: enoxaparin (LOVENOX) injection 30 mg Start: 08/31/21 0800 SCDs Start: 08/30/21 1833  Code Status: DNR Family Communication: No family present.  Tried calling patient's sister, POA, but unable to reach her over the phone Disposition Plan: Status is: Inpatient Remains inpatient appropriate because: Medically stable for discharge pending insurance authorization to return to SNF     Consultants:  Orthopedics  Procedures:  5/25 left hip intramedullary nail  Antimicrobials:      Subjective: Has pain in hip  but controlled with pain meds, no new complaints  Objective: Vitals:   09/01/21 1338 09/01/21 2135 09/02/21 0557 09/02/21 1257  BP: 128/77 (!) 144/80 128/78 105/70  Pulse: 92 (!) 109 95 89  Resp: '18 18 18 17  '$ Temp: 98.1 F (36.7 C) 98.5 F (36.9 C) 98.5 F (36.9 C) 98.9 F (37.2 C)  TempSrc: Oral Oral Oral Oral  SpO2: 94% 93% 91% 93%  Weight:      Height:        Intake/Output Summary (Last 24 hours) at 09/02/2021 1512 Last data filed at 09/02/2021 1258 Gross per 24 hour  Intake 1288.61 ml  Output 950 ml  Net 338.61 ml   Filed Weights   08/29/21 1548  Weight: 74.3 kg    Examination:  General exam: Appears calm and comfortable  Respiratory system: Clear to auscultation. Respiratory effort normal. Cardiovascular system: S1 & S2 heard, RRR. No JVD, murmurs, rubs, gallops or clicks. No pedal edema. Gastrointestinal system: Abdomen is nondistended, soft and nontender. No organomegaly or masses felt. Normal bowel sounds heard. Central nervous system: Alert and oriented. No focal neurological deficits. Extremities: Symmetric 5 x 5 power. Skin: No rashes, lesions or ulcers Psychiatry: Judgement and insight appear normal. Mood & affect appropriate.     Data Reviewed: I have personally reviewed following labs and imaging studies  CBC: Recent Labs  Lab 08/29/21 0930 08/30/21 0323 08/31/21 0332 09/01/21 0307 09/02/21 0359  WBC 3.9* 6.4 7.5 6.5 7.8  NEUTROABS 2.3  --   --   --   --   HGB 12.6 11.5* 10.7* 9.4* 9.4*  HCT 39.9 38.2 35.3* 31.1* 30.6*  MCV 84.2 86.6 87.2 86.1 85.2  PLT 218 190 190  163 762   Basic Metabolic Panel: Recent Labs  Lab 08/29/21 0930 08/30/21 0323 08/31/21 0332 09/01/21 0307 09/02/21 0359  NA 141 143 143 139 138  K 3.7 3.8 3.7 3.3* 3.2*  CL 107 113* 109 106 104  CO2 '27 26 28 28 27  '$ GLUCOSE 94 88 120* 99 98  BUN 24* '14 15 23 21  '$ CREATININE 0.90 0.88 0.76 0.83 0.78  CALCIUM 9.5 8.7* 8.8* 8.7* 8.5*   GFR: Estimated Creatinine  Clearance: 47.7 mL/min (by C-G formula based on SCr of 0.78 mg/dL). Liver Function Tests: No results for input(s): AST, ALT, ALKPHOS, BILITOT, PROT, ALBUMIN in the last 168 hours. No results for input(s): LIPASE, AMYLASE in the last 168 hours. No results for input(s): AMMONIA in the last 168 hours. Coagulation Profile: Recent Labs  Lab 08/29/21 0930  INR 1.0   Cardiac Enzymes: No results for input(s): CKTOTAL, CKMB, CKMBINDEX, TROPONINI in the last 168 hours. BNP (last 3 results) No results for input(s): PROBNP in the last 8760 hours. HbA1C: No results for input(s): HGBA1C in the last 72 hours. CBG: No results for input(s): GLUCAP in the last 168 hours. Lipid Profile: No results for input(s): CHOL, HDL, LDLCALC, TRIG, CHOLHDL, LDLDIRECT in the last 72 hours. Thyroid Function Tests: No results for input(s): TSH, T4TOTAL, FREET4, T3FREE, THYROIDAB in the last 72 hours. Anemia Panel: No results for input(s): VITAMINB12, FOLATE, FERRITIN, TIBC, IRON, RETICCTPCT in the last 72 hours. Sepsis Labs: No results for input(s): PROCALCITON, LATICACIDVEN in the last 168 hours.  Recent Results (from the past 240 hour(s))  Surgical pcr screen     Status: None   Collection Time: 08/29/21  6:31 PM   Specimen: Nasal Mucosa; Nasal Swab  Result Value Ref Range Status   MRSA, PCR NEGATIVE NEGATIVE Final   Staphylococcus aureus NEGATIVE NEGATIVE Final    Comment: (NOTE) The Xpert SA Assay (FDA approved for NASAL specimens in patients 70 years of age and older), is one component of a comprehensive surveillance program. It is not intended to diagnose infection nor to guide or monitor treatment. Performed at [facility], [place] [address] [place], [place] [address]          Radiology Studies: No results found.      Scheduled Meds:  amLODipine  2.5 mg Oral Daily   aspirin  81 mg Oral Daily   cholecalciferol  1,000 Units Oral Daily   docusate sodium  100 mg Oral BID    enoxaparin (LOVENOX) injection  30 mg Subcutaneous Q24H   feeding supplement  237 mL Oral BID BM   furosemide  20 mg Oral Daily   lamoTRIgine  150 mg Oral BID   levETIRAcetam  250 mg Oral Daily   levETIRAcetam  500 mg Oral QHS   multivitamin with minerals  1 tablet Oral Daily   pantoprazole  40 mg Oral Daily   potassium chloride  20 mEq Oral Daily   potassium chloride  40 mEq Oral Q4H   pramipexole  0.5 mg Oral QHS   senna  1 tablet Oral BID   simvastatin  10 mg Oral QHS   umeclidinium bromide  1 puff Inhalation Daily   Continuous Infusions:  sodium chloride 20 mL/hr at 09/01/21 1519     LOS: 4 days    Time spent: 65mns    JKathie Dike MD [facility]   If 7PM-7AM, please contact night-coverage [email or web address]  09/02/2021, 3:12 PM

## 2021-09-02 NOTE — Progress Notes (Addendum)
Subjective: 3 Days Post-Op Procedure(s) (LRB): INTRAMEDULLARY (IM) NAIL INTERTROCHANTRIC (Left) Patient reports pain as 3 on 0-10 scale.   Denies CP or SOB.  Voiding without difficulty. Positive flatus. Objective: Vital signs in last 24 hours: Temp:  [98.1 F (36.7 C)-98.5 F (36.9 C)] 98.5 F (36.9 C) (05/28 0557) Pulse Rate:  [91-109] 95 (05/28 0557) Resp:  [18] 18 (05/28 0557) BP: (128-144)/(77-86) 128/78 (05/28 0557) SpO2:  [91 %-95 %] 91 % (05/28 0557)  Intake/Output from previous day: 05/27 0701 - 05/28 0700 In: 1068.6 [P.O.:120; I.V.:948.6] Out: 1000 [Urine:1000] Intake/Output this shift: No intake/output data recorded.  Recent Labs    08/31/21 0332 09/01/21 0307 09/02/21 0359  HGB 10.7* 9.4* 9.4*   Recent Labs    09/01/21 0307 09/02/21 0359  WBC 6.5 7.8  RBC 3.61* 3.59*  HCT 31.1* 30.6*  PLT 163 190   Recent Labs    09/01/21 0307 09/02/21 0359  NA 139 138  K 3.3* 3.2*  CL 106 104  CO2 28 27  BUN 23 21  CREATININE 0.83 0.78  GLUCOSE 99 98  CALCIUM 8.7* 8.5*   No results for input(s): LABPT, INR in the last 72 hours.  Neurologically intact ABD soft Neurovascular intact Intact pulses distally Dorsiflexion/Plantar flexion intact Incision: dressing C/D/I  Assessment/Plan:  3 Days Post-Op Procedure(s) (LRB): INTRAMEDULLARY (IM) NAIL INTERTROCHANTRIC (Left) Advance diet Up with therapy Discharge to SNFwhen medically ready WBAT. ASA on D/C for DVT prophylaxis   Principal Problem:   Closed left hip fracture (HCC) Active Problems:   Restless legs syndrome (RLS)   Essential hypertension   Seizure disorder (HCC)   Hyperlipidemia   Senile dementia ([place])   Fall at home, initial encounter   Panlobular emphysema ([place])   GERD (gastroesophageal reflux disease)   Heart block AV first degree      Johnn Hai 09/02/2021, '@NOW'$ 

## 2021-09-02 NOTE — TOC Progression Note (Signed)
Transition of Care [facility]) - Progression Note    Patient Details  Name: [name] MRN: [id number] Date of Birth: Aug 04, 1932  Transition of Care [facility]) CM/SW Contact  Pilar Westergaard, Juliann Pulse, RN Phone Number: 09/02/2021, 12:02 PM  Clinical Narrative:  f/u w/[employer] medicare on [id number] awaiting auth-pending for [facility].     Expected Discharge Plan: Skilled Nursing Facility Barriers to Discharge: Insurance Authorization  Expected Discharge Plan and Services Expected Discharge Plan: [place] In-house Referral: Clinical Social Work   Post Acute Care Choice: [place] Living arrangements for the past 2 months: [place]                 DME Arranged: N/A DME Agency: NA                   Social Determinants of Health (SDOH) Interventions    Readmission Risk Interventions     View : No data to display.

## 2021-09-03 DIAGNOSIS — S72002A Fracture of unspecified part of neck of left femur, initial encounter for closed fracture: Secondary | ICD-10-CM | POA: Diagnosis not present

## 2021-09-03 DIAGNOSIS — I10 Essential (primary) hypertension: Secondary | ICD-10-CM | POA: Diagnosis not present

## 2021-09-03 DIAGNOSIS — W19XXXA Unspecified fall, initial encounter: Secondary | ICD-10-CM | POA: Diagnosis not present

## 2021-09-03 DIAGNOSIS — K219 Gastro-esophageal reflux disease without esophagitis: Secondary | ICD-10-CM | POA: Diagnosis not present

## 2021-09-03 MED ORDER — OXYCODONE-ACETAMINOPHEN 5-325 MG PO TABS
1.0000 | ORAL_TABLET | ORAL | Status: DC | PRN
  Administered 2021-09-03 – 2021-09-07 (×9): 1 via ORAL
  Administered 2021-09-08: 2 via ORAL
  Administered 2021-09-08: 1 via ORAL
  Filled 2021-09-03 (×10): qty 1
  Filled 2021-09-03: qty 2

## 2021-09-03 NOTE — TOC Progression Note (Addendum)
Transition of Care [facility]) - Progression Note   Patient Details  Name: [name] MRN: [id number] Date of Birth: 12-Jun-1932  [facility]) CM/SW Real, LCSW Phone Number: 09/03/2021, 9:30 AM  Clinical Narrative: Patient's insurance authorization for SNF is still pending.   Addendum: CSW notified by [name] that patient was reviewed by the medical director and a peer to peer is being offered. Hospitalist updated to call [phone] option 5. Peer to peer is due tomorrow by noon.  Addendum #2: CSW notified by [name] that peer to peer was denied and the patient or a representative can appeal the denial [phone] for appeals, [phone] to fax clinicals). CSW spoke with [name] at [facility] and it was requested that the patient file an appeal. CSW spoke with patient and her sister, [name], about filing the appeal. Sister reported patient can file the appeal, but patient is requesting family assistance as she does not think she can file the appeal by herself. CSW requested that patient or her sister start the appeal process and appeal number was provided.  Expected Discharge Plan: Skilled Nursing Facility Barriers to Discharge: Insurance Authorization  Expected Discharge Plan and Services Expected Discharge Plan: [place] In-house Referral: Clinical Social Work Post Acute Care Choice: [place] Living arrangements for the past 2 months: [place]              DME Arranged: N/A DME Agency: NA  Readmission Risk Interventions     View : No data to display.

## 2021-09-03 NOTE — Progress Notes (Signed)
OT Cancellation Note  Patient Details Name: [name] MRN: [id number] DOB: 10-05-1932   Cancelled Treatment:    Reason Eval/Treat Not Completed: Other (comment) Patient was approached x2 this am with patient eating breakfast the first time and second she was using bed pan with increased loose stools per patient report. OT to continue to follow and check back as schedule will allow.  Jackelyn Poling OTR/L, [place] Acute Rehabilitation Department Office# [phone] Pager# [phone]  09/03/2021, 10:19 AM

## 2021-09-03 NOTE — Progress Notes (Signed)
Physical Therapy Treatment Patient Details Name: [name] MRN: [id number] DOB: 05/16/1932 Today's Date: 09/03/2021   History of Present Illness 88 year old female with a history of emphysema, hypertension, seizures, presents after a mechanical fall where she suffered a left hip fracture.  She did not have any syncopal event/loss of consciousness or head trauma.  She was seen by orthopedics and underwent operative management on 5/25.    PT Comments    Pt is slowly progressing. Limited this date by profound and global weakness, pain and fatigue. Pt has much difficulty WBing through LEs L>R d/t pain and weakness. Pt on bed pan on arrival, Bed pads changed and pericare completed. NT updated.  Recommend SNF (pt from [facility]) post acute   Recommendations for follow up therapy are one component of a multi-disciplinary discharge planning process, led by the attending physician.  Recommendations may be updated based on patient status, additional functional criteria and insurance authorization.  Follow Up Recommendations  Skilled nursing-short term rehab (<3 hours/day)     Assistance Recommended at Discharge Frequent or constant Supervision/Assistance  Patient can return home with the following Two people to help with walking and/or transfers;A little help with bathing/dressing/bathroom;Assistance with cooking/housework;Assist for transportation;Help with stairs or ramp for entrance   Equipment Recommendations  None recommended by PT    Recommendations for Other Services       Precautions / Restrictions Precautions Precautions: Fall Restrictions Weight Bearing Restrictions: No LLE Weight Bearing: Weight bearing as tolerated     Mobility  Bed Mobility Overal bed mobility: Needs Assistance Bed Mobility: Sit to Supine, Supine to Sit, Rolling Rolling: Mod assist, Max assist   Supine to sit: Max assist, +2 for physical assistance, +2 for safety/equipment Sit to supine: Max assist, +2  for physical assistance, +2 for safety/equipment, Total assist   General bed mobility comments: assist for LEs, to control trunk and to complete rotation to EOB/back into bed using pad; assist for descent of trunk and to lift bil LEs on to bed on return to supine. pt assists scooting up in supine with RLE as able, grossly total assist of 2 to scoot up in bed    Transfers Overall transfer level: Needs assistance Equipment used: Rolling walker (2 wheels) Transfers: Sit to/from Stand Sit to Stand: +2 physical assistance, +2 safety/equipment, Total assist           General transfer comment: attempted standing, pt unable to WB through LEs, extending R knee/unable to keep foot on floor  when knee  not blocked by therapist. unable to come to stand with +2 assist    Ambulation/Gait                   Stairs             Wheelchair Mobility    Modified Rankin (Stroke Patients Only)       Balance     Sitting balance-Leahy Scale: Fair Sitting balance - Comments: with multi-modal cues and assist to laterally wt shift for scooting. bed pad used to assist. able to maintain midline with UE support and close supervision     Standing balance-Leahy Scale: Zero                              Cognition Arousal/Alertness: Awake/alert Behavior During Therapy: WFL for tasks assessed/performed Overall Cognitive Status: Within Functional Limits for tasks assessed  Exercises      General Comments        Pertinent Vitals/Pain Pain Assessment Pain Assessment: Faces Faces Pain Scale: Hurts even more Pain Location: L hip with activity Pain Descriptors / Indicators: Grimacing, Guarding, Sore Pain Intervention(s): Limited activity within patient's tolerance, Monitored during session, Repositioned    Home Living                          Prior Function            PT Goals (current goals can  now be found in the care plan section) Acute Rehab PT Goals Patient Stated Goal: Regain IND PT Goal Formulation: With patient Time For Goal Achievement: 09/14/21 Potential to Achieve Goals: Fair Progress towards PT goals: Progressing toward goals (slowly)    Frequency    Min 2X/week (from SNF)      PT Plan Current plan remains appropriate    Co-evaluation              AM-PAC PT "6 Clicks" Mobility   Outcome Measure  Help needed turning from your back to your side while in a flat bed without using bedrails?: Total Help needed moving from lying on your back to sitting on the side of a flat bed without using bedrails?: Total Help needed moving to and from a bed to a chair (including a wheelchair)?: Total Help needed standing up from a chair using your arms (e.g., wheelchair or bedside chair)?: Total Help needed to walk in hospital room?: Total Help needed climbing 3-5 steps with a railing? : Total 6 Click Score: 6    End of Session Equipment Utilized During Treatment: Gait belt Activity Tolerance: Patient limited by fatigue;Patient limited by pain Patient left: in bed;with call bell/phone within reach;with bed alarm set Nurse Communication: Mobility status PT Visit Diagnosis: Unsteadiness on feet (R26.81);Difficulty in walking, not elsewhere classified (R26.2);Muscle weakness (generalized) (M62.81);History of falling (Z91.81);Pain Pain - Right/Left: Left Pain - part of body: Hip     Time: 1245-8099 PT Time Calculation (min) (ACUTE ONLY): 25 min  Charges:  $Therapeutic Activity: 23-37 mins                     Baxter Flattery, PT  Acute Rehab Dept ([facility]) [phone] Pager [phone]  09/03/2021    [facility] 09/03/2021, 11:32 AM

## 2021-09-03 NOTE — Plan of Care (Signed)
  Problem: Health Behavior/Discharge Planning: Goal: Ability to manage health-related needs will improve Outcome: Progressing   Problem: Clinical Measurements: Goal: Ability to maintain clinical measurements within normal limits will improve Outcome: Progressing   Problem: Activity: Goal: Risk for activity intolerance will decrease Outcome: Progressing   Problem: Pain Managment: Goal: General experience of comfort will improve Outcome: Progressing   Problem: Safety: Goal: Ability to remain free from injury will improve Outcome: Progressing   Problem: Skin Integrity: Goal: Risk for impaired skin integrity will decrease Outcome: Progressing   

## 2021-09-03 NOTE — Plan of Care (Signed)
  Problem: Coping: Goal: Level of anxiety will decrease Outcome: Progressing   Problem: Pain Managment: Goal: General experience of comfort will improve Outcome: Progressing   

## 2021-09-03 NOTE — Progress Notes (Signed)
PROGRESS NOTE    [name]  ZOX:[id number] DOB: 03-31-1933 DOA: 08/29/2021 PCP: [name], MD    Brief Narrative:  88 year old female with a history of emphysema, hypertension, seizures, presents after a mechanical fall where she suffered a left hip fracture.  She did not have any syncopal event/loss of consciousness or head trauma.  She was seen by orthopedics and underwent operative management on 5/25.   Assessment & Plan:   Principal Problem:   Closed left hip fracture ([place]) Active Problems:   Senile dementia (HCC)   Restless legs syndrome (RLS)   Essential hypertension   Seizure disorder (HCC)   Hyperlipidemia   Fall at home, initial encounter   Panlobular emphysema ([place])   GERD (gastroesophageal reflux disease)   Heart block AV first degree   Mechanical fall Left hip fracture -Seen by orthopedics and underwent operative management on 5/25 -PT/OT -Pain management.  Does not appear that hydrocodone is providing adequate pain relief.  We will give a trial of Percocet. -Awaiting on insurance authorization to return to skilled facility  Hypertension -On amlodipine -Blood pressure stable  Seizure disorder -Continue Keppra  GERD -Continue PPI  COPD -No wheezing at present -Continue bronchodilators  Hyperlipidemia -Continue statin  Restless leg syndrome -Continue on Mirapex bedtime  Hypokalemia -Replace -check Mg  Anemia -Likely related to blood loss during surgery -Hemodynamically stable -Continue to monitor   DVT prophylaxis: enoxaparin (LOVENOX) injection 30 mg Start: 08/31/21 0800 SCDs Start: 08/30/21 1833  Code Status: DNR Family Communication: No family present.  Tried calling patient's sister, POA, but unable to reach her over the phone Disposition Plan: Status is: Inpatient Remains inpatient appropriate because: Medically stable for discharge pending insurance authorization to return to SNF     Consultants:   Orthopedics  Procedures:  5/25 left hip intramedullary nail  Antimicrobials:      Subjective: Continues to have pain which is limiting her ability to work with physical therapy.  She did have a bowel movement.  Objective: Vitals:   09/02/21 1257 09/02/21 2059 09/03/21 0502 09/03/21 1257  BP: 105/70 (!) 144/87 130/80 139/89  Pulse: 89 (!) 101 94 84  Resp: '17 18 16 16  '$ Temp: 98.9 F (37.2 C) 98 F (36.7 C) 98.4 F (36.9 C) 97.9 F (36.6 C)  TempSrc: Oral Oral Oral Oral  SpO2: 93% 92% (!) 89% 93%  Weight:      Height:        Intake/Output Summary (Last 24 hours) at 09/03/2021 1701 Last data filed at 09/03/2021 0942 Gross per 24 hour  Intake 932.98 ml  Output 800 ml  Net 132.98 ml   Filed Weights   08/29/21 1548  Weight: 74.3 kg    Examination:  General exam: Appears calm and comfortable  Respiratory system: Clear to auscultation. Respiratory effort normal. Cardiovascular system: S1 & S2 heard, RRR. No JVD, murmurs, rubs, gallops or clicks. No pedal edema. Gastrointestinal system: Abdomen is nondistended, soft and nontender. No organomegaly or masses felt. Normal bowel sounds heard. Central nervous system: Alert and oriented. No focal neurological deficits. Extremities: Symmetric 5 x 5 power. Skin: No rashes, lesions or ulcers Psychiatry: Judgement and insight appear normal. Mood & affect appropriate.     Data Reviewed: I have personally reviewed following labs and imaging studies  CBC: Recent Labs  Lab 08/29/21 0930 08/30/21 0323 08/31/21 0332 09/01/21 0307 09/02/21 0359  WBC 3.9* 6.4 7.5 6.5 7.8  NEUTROABS 2.3  --   --   --   --  HGB 12.6 11.5* 10.7* 9.4* 9.4*  HCT 39.9 38.2 35.3* 31.1* 30.6*  MCV 84.2 86.6 87.2 86.1 85.2  PLT 218 190 190 163 177   Basic Metabolic Panel: Recent Labs  Lab 08/29/21 0930 08/30/21 0323 08/31/21 0332 09/01/21 0307 09/02/21 0359  NA 141 143 143 139 138  K 3.7 3.8 3.7 3.3* 3.2*  CL 107 113* 109 106 104  CO2 '27  26 28 28 27  '$ GLUCOSE 94 88 120* 99 98  BUN 24* '14 15 23 21  '$ CREATININE 0.90 0.88 0.76 0.83 0.78  CALCIUM 9.5 8.7* 8.8* 8.7* 8.5*  MG  --   --   --   --  2.2   GFR: Estimated Creatinine Clearance: 47.7 mL/min (by C-G formula based on SCr of 0.78 mg/dL). Liver Function Tests: No results for input(s): AST, ALT, ALKPHOS, BILITOT, PROT, ALBUMIN in the last 168 hours. No results for input(s): LIPASE, AMYLASE in the last 168 hours. No results for input(s): AMMONIA in the last 168 hours. Coagulation Profile: Recent Labs  Lab 08/29/21 0930  INR 1.0   Cardiac Enzymes: No results for input(s): CKTOTAL, CKMB, CKMBINDEX, TROPONINI in the last 168 hours. BNP (last 3 results) No results for input(s): PROBNP in the last 8760 hours. HbA1C: No results for input(s): HGBA1C in the last 72 hours. CBG: No results for input(s): GLUCAP in the last 168 hours. Lipid Profile: No results for input(s): CHOL, HDL, LDLCALC, TRIG, CHOLHDL, LDLDIRECT in the last 72 hours. Thyroid Function Tests: No results for input(s): TSH, T4TOTAL, FREET4, T3FREE, THYROIDAB in the last 72 hours. Anemia Panel: No results for input(s): VITAMINB12, FOLATE, FERRITIN, TIBC, IRON, RETICCTPCT in the last 72 hours. Sepsis Labs: No results for input(s): PROCALCITON, LATICACIDVEN in the last 168 hours.  Recent Results (from the past 240 hour(s))  Surgical pcr screen     Status: None   Collection Time: 08/29/21  6:31 PM   Specimen: Nasal Mucosa; Nasal Swab  Result Value Ref Range Status   MRSA, PCR NEGATIVE NEGATIVE Final   Staphylococcus aureus NEGATIVE NEGATIVE Final    Comment: (NOTE) The Xpert SA Assay (FDA approved for NASAL specimens in patients 64 years of age and older), is one component of a comprehensive surveillance program. It is not intended to diagnose infection nor to guide or monitor treatment. Performed at [facility], [place] [address] [place], [place] [address]          Radiology  Studies: No results found.      Scheduled Meds:  amLODipine  2.5 mg Oral Daily   aspirin  81 mg Oral Daily   cholecalciferol  1,000 Units Oral Daily   docusate sodium  100 mg Oral BID   enoxaparin (LOVENOX) injection  30 mg Subcutaneous Q24H   feeding supplement  237 mL Oral BID BM   furosemide  20 mg Oral Daily   lamoTRIgine  150 mg Oral BID   levETIRAcetam  250 mg Oral Daily   levETIRAcetam  500 mg Oral QHS   multivitamin with minerals  1 tablet Oral Daily   pantoprazole  40 mg Oral Daily   potassium chloride  20 mEq Oral Daily   pramipexole  0.5 mg Oral QHS   senna  1 tablet Oral BID   simvastatin  10 mg Oral QHS   umeclidinium bromide  1 puff Inhalation Daily   Continuous Infusions:  sodium chloride Stopped (09/03/21 0436)     LOS: 5 days    Time spent: 34mns  Kathie Dike, MD [facility]   If 7PM-7AM, please contact night-coverage [email or web address]  09/03/2021, 5:01 PM

## 2021-09-04 DIAGNOSIS — I10 Essential (primary) hypertension: Secondary | ICD-10-CM | POA: Diagnosis not present

## 2021-09-04 DIAGNOSIS — W19XXXA Unspecified fall, initial encounter: Secondary | ICD-10-CM | POA: Diagnosis not present

## 2021-09-04 DIAGNOSIS — S72002A Fracture of unspecified part of neck of left femur, initial encounter for closed fracture: Secondary | ICD-10-CM | POA: Diagnosis not present

## 2021-09-04 DIAGNOSIS — K219 Gastro-esophageal reflux disease without esophagitis: Secondary | ICD-10-CM | POA: Diagnosis not present

## 2021-09-04 LAB — BASIC METABOLIC PANEL
Anion gap: 10 (ref 5–15)
BUN: 31 mg/dL — ABNORMAL HIGH (ref 8–23)
CO2: 26 mmol/L (ref 22–32)
Calcium: 8.8 mg/dL — ABNORMAL LOW (ref 8.9–10.3)
Chloride: 108 mmol/L (ref 98–111)
Creatinine, Ser: 0.88 mg/dL (ref 0.44–1.00)
GFR, Estimated: >60 mL/min (ref >60)
Glucose, Bld: 102 mg/dL — ABNORMAL HIGH (ref 70–99)
Potassium: 3.9 mmol/L (ref 3.5–5.1)
Sodium: 144 mmol/L (ref 135–145)

## 2021-09-04 LAB — CBC
HCT: 32.5 % — ABNORMAL LOW (ref 36.0–46.0)
Hemoglobin: 10 g/dL — ABNORMAL LOW (ref 12.0–15.0)
MCH: 26.6 pg (ref 26.0–34.0)
MCHC: 30.8 g/dL (ref 30.0–36.0)
MCV: 86.4 fL (ref 80.0–100.0)
Platelets: 239 10*3/uL (ref 150–400)
RBC: 3.76 MIL/uL — ABNORMAL LOW (ref 3.87–5.11)
RDW: 16.8 % — ABNORMAL HIGH (ref 11.5–15.5)
WBC: 7.7 10*3/uL (ref 4.0–10.5)
nRBC: 0 % (ref 0.0–0.2)

## 2021-09-04 MED ORDER — LOPERAMIDE HCL 2 MG PO CAPS
2.0000 mg | ORAL_CAPSULE | Freq: Once | ORAL | Status: AC
  Administered 2021-09-04: 2 mg via ORAL
  Filled 2021-09-04: qty 1

## 2021-09-04 NOTE — TOC Progression Note (Signed)
Transition of Care [facility]) - Progression Note   Patient Details  Name: [name] MRN: [id number] Date of Birth: 1932/09/18  Transition of Care [facility]) CM/SW [place], LCSW Phone Number: 09/04/2021, 11:10 AM  Clinical Narrative: CSW assisted patient will completing the expedited appeal over the phone with [employer] [phone]). Reference # is: [id number]. Clinicals faxed to [phone] for review. CSW updated Joellen Jersey at [facility]. TOC awaiting outcome of appeal.  Expected Discharge Plan: [place] Barriers to Discharge: Ship broker  Expected Discharge Plan and Services Expected Discharge Plan: [place] In-house Referral: Clinical Social Work Post Acute Care Choice: [place] Living arrangements for the past 2 months: [place]             DME Arranged: N/A DME Agency: NA  Readmission Risk Interventions     View : No data to display.

## 2021-09-04 NOTE — Plan of Care (Signed)
Multiple loose stools this shift, colace and senna DC's, Imodium x 1. PIV infiltrated, OK to keep out per attending.   Problem: Education: Goal: Knowledge of General Education information will improve Description: Including pain rating scale, medication(s)/side effects and non-pharmacologic comfort measures Outcome: Progressing

## 2021-09-04 NOTE — Plan of Care (Signed)
  Problem: Coping: Goal: Level of anxiety will decrease Outcome: Progressing   Problem: Pain Managment: Goal: General experience of comfort will improve Outcome: Progressing   

## 2021-09-04 NOTE — Progress Notes (Addendum)
PROGRESS NOTE    [name]  HBZ:[id number] DOB: 09-17-1932 DOA: 08/29/2021 PCP: [name], MD    Brief Narrative:  88 year old female with a history of emphysema, hypertension, seizures, presents after a mechanical fall where she suffered a left hip fracture.  She did not have any syncopal event/loss of consciousness or head trauma.  She was seen by orthopedics and underwent operative management on 5/25.   Assessment & Plan:   Principal Problem:   Closed left hip fracture ([place]) Active Problems:   Senile dementia (HCC)   Restless legs syndrome (RLS)   Essential hypertension   Seizure disorder (HCC)   Hyperlipidemia   Fall at home, initial encounter   Panlobular emphysema ([place])   GERD (gastroesophageal reflux disease)   Heart block AV first degree   Mechanical fall Left hip fracture -Seen by orthopedics and underwent operative management on 5/25 -PT/OT -Pain management.  Continue percocet -DVT prophylaxis per ortho: lovenox in hospital, ASA on DC -Awaiting appeal decision on insurance denial for skilled PT/OT at SNF   Hypertension -On amlodipine -Blood pressure stable  Seizure disorder -Continue Keppra  GERD -Continue PPI  COPD -No wheezing at present -Continue bronchodilators  Hyperlipidemia -Continue statin  Restless leg syndrome -Continue on Mirapex bedtime  Hypokalemia -Replaced -Mag normal  Anemia -Likely related to blood loss during surgery -Hemodynamically stable -Continue to monitor  Loose stools -staff reports several loose stools today -holding colace and senekot -give one dose of imodium today   DVT prophylaxis: enoxaparin (LOVENOX) injection 30 mg Start: 08/31/21 0800 SCDs Start: 08/30/21 1833  Code Status: DNR Family Communication: No family present.  Tried calling patient's sister, POA, but unable to reach her over the phone Disposition Plan: Status is: Inpatient Remains inpatient appropriate because: Medically stable  for discharge pending insurance authorization to return to SNF.  Initial SNF authorization for skilled physical therapy at facility was denied.  Patient is currently in appeal     Consultants:  Orthopedics  Procedures:  5/25 left hip intramedullary nail  Antimicrobials:      Subjective: She says her pain is about the same as it was yesterday.  She is having several bowel movements.  Objective: Vitals:   09/03/21 1257 09/03/21 2136 09/04/21 0615 09/04/21 1300  BP: 139/89 (!) 149/88 122/76 129/90  Pulse: 84 (!) 107 99 93  Resp: '16 19 18 16  '$ Temp: 97.9 F (36.6 C) 98.2 F (36.8 C) 98.7 F (37.1 C) 97.8 F (36.6 C)  TempSrc: Oral Oral Oral Oral  SpO2: 93% 92% 92% 96%  Weight:      Height:        Intake/Output Summary (Last 24 hours) at 09/04/2021 1707 Last data filed at 09/04/2021 1145 Gross per 24 hour  Intake 720 ml  Output 200 ml  Net 520 ml   Filed Weights   08/29/21 1548  Weight: 74.3 kg    Examination:  General exam: Appears calm and comfortable  Respiratory system: Clear to auscultation. Respiratory effort normal. Cardiovascular system: S1 & S2 heard, RRR. No JVD, murmurs, rubs, gallops or clicks. No pedal edema. Gastrointestinal system: Abdomen is nondistended, soft and nontender. No organomegaly or masses felt. Normal bowel sounds heard. Central nervous system: Alert and oriented. No focal neurological deficits. Extremities: Symmetric 5 x 5 power. Skin: No rashes, lesions or ulcers Psychiatry: Judgement and insight appear normal. Mood & affect appropriate.     Data Reviewed: I have personally reviewed following labs and imaging studies  CBC: Recent Labs  Lab 08/29/21 0930 08/30/21 0323 08/31/21 0332 09/01/21 0307 09/02/21 0359 09/04/21 1308  WBC 3.9* 6.4 7.5 6.5 7.8 7.7  NEUTROABS 2.3  --   --   --   --   --   HGB 12.6 11.5* 10.7* 9.4* 9.4* 10.0*  HCT 39.9 38.2 35.3* 31.1* 30.6* 32.5*  MCV 84.2 86.6 87.2 86.1 85.2 86.4  PLT 218 190 190  163 190 379   Basic Metabolic Panel: Recent Labs  Lab 08/30/21 0323 08/31/21 0332 09/01/21 0307 09/02/21 0359 09/04/21 1308  NA 143 143 139 138 144  K 3.8 3.7 3.3* 3.2* 3.9  CL 113* 109 106 104 108  CO2 '26 28 28 27 26  '$ GLUCOSE 88 120* 99 98 102*  BUN '14 15 23 21 '$ 31*  CREATININE 0.88 0.76 0.83 0.78 0.88  CALCIUM 8.7* 8.8* 8.7* 8.5* 8.8*  MG  --   --   --  2.2  --    GFR: Estimated Creatinine Clearance: 43.3 mL/min (by C-G formula based on SCr of 0.88 mg/dL). Liver Function Tests: No results for input(s): AST, ALT, ALKPHOS, BILITOT, PROT, ALBUMIN in the last 168 hours. No results for input(s): LIPASE, AMYLASE in the last 168 hours. No results for input(s): AMMONIA in the last 168 hours. Coagulation Profile: Recent Labs  Lab 08/29/21 0930  INR 1.0   Cardiac Enzymes: No results for input(s): CKTOTAL, CKMB, CKMBINDEX, TROPONINI in the last 168 hours. BNP (last 3 results) No results for input(s): PROBNP in the last 8760 hours. HbA1C: No results for input(s): HGBA1C in the last 72 hours. CBG: No results for input(s): GLUCAP in the last 168 hours. Lipid Profile: No results for input(s): CHOL, HDL, LDLCALC, TRIG, CHOLHDL, LDLDIRECT in the last 72 hours. Thyroid Function Tests: No results for input(s): TSH, T4TOTAL, FREET4, T3FREE, THYROIDAB in the last 72 hours. Anemia Panel: No results for input(s): VITAMINB12, FOLATE, FERRITIN, TIBC, IRON, RETICCTPCT in the last 72 hours. Sepsis Labs: No results for input(s): PROCALCITON, LATICACIDVEN in the last 168 hours.  Recent Results (from the past 240 hour(s))  Surgical pcr screen     Status: None   Collection Time: 08/29/21  6:31 PM   Specimen: Nasal Mucosa; Nasal Swab  Result Value Ref Range Status   MRSA, PCR NEGATIVE NEGATIVE Final   Staphylococcus aureus NEGATIVE NEGATIVE Final    Comment: (NOTE) The Xpert SA Assay (FDA approved for NASAL specimens in patients 16 years of age and older), is one component of a  comprehensive surveillance program. It is not intended to diagnose infection nor to guide or monitor treatment. Performed at [facility], [place] [address] [place], [place] [address]          Radiology Studies: No results found.      Scheduled Meds:  amLODipine  2.5 mg Oral Daily   aspirin  81 mg Oral Daily   cholecalciferol  1,000 Units Oral Daily   enoxaparin (LOVENOX) injection  30 mg Subcutaneous Q24H   feeding supplement  237 mL Oral BID BM   furosemide  20 mg Oral Daily   lamoTRIgine  150 mg Oral BID   levETIRAcetam  250 mg Oral Daily   levETIRAcetam  500 mg Oral QHS   multivitamin with minerals  1 tablet Oral Daily   pantoprazole  40 mg Oral Daily   potassium chloride  20 mEq Oral Daily   pramipexole  0.5 mg Oral QHS   simvastatin  10 mg Oral QHS   umeclidinium bromide  1 puff Inhalation Daily   Continuous Infusions:     LOS: 6 days    Time spent: 58mns    JKathie Dike MD [facility]   If 7PM-7AM, please contact night-coverage [email or web address]  09/04/2021, 5:07 PM

## 2021-09-05 DIAGNOSIS — I10 Essential (primary) hypertension: Secondary | ICD-10-CM | POA: Diagnosis not present

## 2021-09-05 DIAGNOSIS — S72002A Fracture of unspecified part of neck of left femur, initial encounter for closed fracture: Secondary | ICD-10-CM | POA: Diagnosis not present

## 2021-09-05 DIAGNOSIS — S2242XA Multiple fractures of ribs, left side, initial encounter for closed fracture: Secondary | ICD-10-CM

## 2021-09-05 LAB — IRON AND TIBC
Iron: 64 ug/dL (ref 28–170)
Saturation Ratios: 17 % (ref 10.4–31.8)
TIBC: 369 ug/dL (ref 250–450)
UIBC: 305 ug/dL

## 2021-09-05 LAB — FOLATE: Folate: 23.1 ng/mL (ref >5.9)

## 2021-09-05 LAB — RETICULOCYTES
Immature Retic Fract: 27.3 % — ABNORMAL HIGH (ref 2.3–15.9)
RBC.: 3.76 MIL/uL — ABNORMAL LOW (ref 3.87–5.11)
Retic Count, Absolute: 121.1 10*3/uL (ref 19.0–186.0)
Retic Ct Pct: 3.2 % — ABNORMAL HIGH (ref 0.4–3.1)

## 2021-09-05 LAB — FERRITIN: Ferritin: 31 ng/mL (ref 11–307)

## 2021-09-05 LAB — VITAMIN B12: Vitamin B-12: 249 pg/mL (ref 180–914)

## 2021-09-05 MED ORDER — ENSURE ENLIVE PO LIQD
237.0000 mL | Freq: Two times a day (BID) | ORAL | Status: DC
  Administered 2021-09-05 – 2021-09-08 (×6): 237 mL via ORAL

## 2021-09-05 NOTE — Progress Notes (Signed)
Occupational Therapy Treatment Patient Details Name: [name] MRN: [id number] DOB: 09-Oct-1932 Today's Date: 09/05/2021   History of present illness 88 year old female with a history of emphysema, hypertension, seizures, presents after a mechanical fall where she suffered a left hip fracture.  She did not have any syncopal event/loss of consciousness or head trauma.  She was seen by orthopedics and underwent Left iM nail on 5/25.   OT comments  Patient was able to engage in sitting balance challenges sitting on edge of bed for grooming tasks. Patient was noted to offload pressure from L hip with lateral R leaning with cues for sitting balance and mod A to regain sitting. Patient was noted to have moments of sequencing difficulties with cues needed to improve. Patient's discharge plan remains appropriate at this time. OT will continue to follow acutely.     Recommendations for follow up therapy are one component of a multi-disciplinary discharge planning process, led by the attending physician.  Recommendations may be updated based on patient status, additional functional criteria and insurance authorization.    Follow Up Recommendations  Skilled nursing-short term rehab (<3 hours/day)    Assistance Recommended at Discharge Frequent or constant Supervision/Assistance  Patient can return home with the following  A lot of help with walking and/or transfers;A lot of help with bathing/dressing/bathroom;Assistance with cooking/housework;Help with stairs or ramp for entrance;Assist for transportation   Equipment Recommendations  None recommended by OT    Recommendations for Other Services      Precautions / Restrictions Precautions Precautions: Fall Restrictions Weight Bearing Restrictions: No LLE Weight Bearing: Weight bearing as tolerated       Mobility Bed Mobility Overal bed mobility: Needs Assistance       Supine to sit: Total assist, HOB elevated Sit to supine: Total  assist   General bed mobility comments: physical assistance for trunk and BLE with increased time and use of bed pad to get BLE to EOB.    Transfers                         Balance Overall balance assessment: Needs assistance Sitting-balance support: No upper extremity supported, Feet supported Sitting balance-Leahy Scale: Fair   Postural control: Posterior lean, Right lateral lean                                 ADL either performed or assessed with clinical judgement   ADL Overall ADL's : Needs assistance/impaired Eating/Feeding: Sitting;Independent Eating/Feeding Details (indicate cue type and reason): with min guard for sitting balance with some posterior leaning at times to take sips EOB Grooming: Set up;Sitting Grooming Details (indicate cue type and reason): patient was noted to have lateral leaning to R side with patient cued to sit upright. patient reported that she has kyphotic posture. patient was educated that leaning over onto elbows to offload pressure on L hip was not sitting upright. patient needed min A to mod A for sitting balance.                                    Extremity/Trunk Assessment              Vision       Perception     Praxis      Cognition Arousal/Alertness: Awake/alert Behavior During Therapy: WFL for tasks  assessed/performed Overall Cognitive Status: Within Functional Limits for tasks assessed                                 General Comments: noted to have some confusion with increased incontinence episodes.        Exercises      Shoulder Instructions       General Comments      Pertinent Vitals/ Pain       Pain Assessment Pain Assessment: Faces Faces Pain Scale: Hurts little more Pain Location: L hip with activity Pain Descriptors / Indicators: Grimacing, Guarding, Sore Pain Intervention(s): Limited activity within patient's tolerance, Monitored during session,  Repositioned  Home Living                                          Prior Functioning/Environment              Frequency  Min 2X/week        Progress Toward Goals  OT Goals(current goals can now be found in the care plan section)  Progress towards OT goals: Progressing toward goals     Plan Discharge plan remains appropriate    Co-evaluation                 AM-PAC OT "6 Clicks" Daily Activity     Outcome Measure   Help from another person eating meals?: None Help from another person taking care of personal grooming?: A Little Help from another person toileting, which includes using toliet, bedpan, or urinal?: Total Help from another person bathing (including washing, rinsing, drying)?: A Lot Help from another person to put on and taking off regular upper body clothing?: A Little Help from another person to put on and taking off regular lower body clothing?: Total 6 Click Score: 14    End of Session    OT Visit Diagnosis: Other abnormalities of gait and mobility (R26.89);Pain   Activity Tolerance Patient tolerated treatment well   Patient Left with call bell/phone within reach;in bed;with bed alarm set;with nursing/sitter in room   Nurse Communication Mobility status        Time: 1035-1110 OT Time Calculation (min): 35 min  Charges: OT General Charges $OT Visit: 1 Visit OT Treatments $Self Care/Home Management : 23-37 mins  Jackelyn Poling OTR/L, MS Acute Rehabilitation Department Office# [phone] Pager# [phone]   Marcellina Millin 09/05/2021, 1:10 PM

## 2021-09-05 NOTE — Progress Notes (Signed)
Nutrition Follow-up  INTERVENTION:   -Ensure Plus High Protein po BID, each supplement provides 350 kcal and 20 grams of protein.   -Multivitamin with minerals daily  -Liberalize diet to Regular to maximize menu options. Pt with poor PO currently.   NUTRITION DIAGNOSIS:   Increased nutrient needs related to hip fracture, post-op healing as evidenced by estimated needs.  Ongoing.  GOAL:   Patient will meet greater than or equal to 90% of their needs  Progressing.  MONITOR:   PO intake, Supplement acceptance, Labs, Weight trends, I & O's   ASSESSMENT:   88 y.o. female with medical history of HTN, PAD, COPD, panlobular emphysema, seizures, dementia, insomnia, major depressive disorder, mitral valve disorder, osteoporosis, essential tremor, and arthritis. She presented to the ED after a fall with subsequent L hip pain. She was admitted for closed L hip fracture.  5/25: s/p  Intramedullary fixation, Left femur.   Patient currently consuming 0-50% of meals. Not accepting Ensure for the past 48 hours. Will switch to Ensure High Protein for more flavor options.   Admission weight: 163 lbs. No new weight for admission.  Medications: Vitamin D, Lasix, Multivitamin with minerals daily, KLOR-CON  Labs reviewed.  Diet Order:   Diet Order             Diet Heart Room service appropriate? Yes; Fluid consistency: Thin  Diet effective now                   EDUCATION NEEDS:   No education needs have been identified at this time  Skin:  Skin Assessment: Skin Integrity Issues: Skin Integrity Issues:: Incisions Incisions: 5/25: left hip  Last BM:  5/30 -type 7  Height:   Ht Readings from Last 1 Encounters:  08/29/21 '5\' 2"'$  (1.575 m)    Weight:   Wt Readings from Last 1 Encounters:  08/29/21 74.3 kg    BMI:  Body mass index is 29.94 kg/m.  Estimated Nutritional Needs:   Kcal:  1650-1850 kcal  Protein:  80-90 grams  Fluid:  >/= 1.7 L/day   Clayton Bibles, MS, RD, LDN Inpatient Clinical Dietitian Contact information available via Amion

## 2021-09-05 NOTE — Care Management Important Message (Signed)
Important Message  Patient Details IM Letter placed in Patients room. Name: [name] MRN: [id number] Date of Birth: 10-03-1932   Medicare Important Message Given:  Yes     Kerin Salen 09/05/2021, 1:19 PM

## 2021-09-05 NOTE — Progress Notes (Signed)
[facility] PROGRESS NOTE    Progress Note  [name]  YWV:[id number] DOB: 1932-11-13 DOA: 08/29/2021 PCP: [name], MD     Brief Narrative:   [name] is an 88 y.o. female past medical history of emphysema, essential hypertension and seizure presents after a mechanical fall suffering left hip fracture did not lose consciousness was seen by orthopedic surgery and underwent a period of management on 08/30/2021    Assessment/Plan:   Closed left hip fracture [facility]) Therapy evaluated the patient, patient needs to go to skilled nursing facility. Awaiting appeal decision on insurance denial for skilled.  Essential hypertension: Continue amlodipine.  Normocytic anemia/acute blood loss anemia: Likely due to surgical intervention hemoglobin is now stable. MCV is borderline low RDW slightly high check an anemia panel to rule out iron deficiency anemia.  Seizure disorder: Continue Keppra.  GERD: Continue PPI.  COPD: Continue inhalers.  Hyperlipidemia: Continue statins.  Restless leg syndrome: Continue Mirapex.  Hypokalemia: Replete orally now resolved.   DVT prophylaxis: lovenox Family Communication:none Status is: Inpatient Remains inpatient appropriate because: Acute left hip fracture    Code Status:     Code Status Orders  (From admission, onward)           Start     Ordered   08/29/21 1601  Do not attempt resuscitation (DNR)  Continuous       Question Answer Comment  In the event of cardiac or respiratory ARREST Do not call a "code blue"   In the event of cardiac or respiratory ARREST Do not perform Intubation, CPR, defibrillation or ACLS   In the event of cardiac or respiratory ARREST Use medication by any route, position, wound care, and other measures to relive pain and suffering. May use oxygen, suction and manual treatment of airway obstruction as needed for comfort.   Comments Confirmed w/ patient through multiple lines of  questioning.      08/29/21 1600           Code Status History     Date Active Date Inactive Code Status Order ID Comments User Context   05/04/2019 0322 05/13/2019 2302 DNR [id number]  Phillips Grout, MD ED      Advance Directive Documentation    Flowsheet Row Most Recent Value  Type of Advance Directive Healthcare Power of Attorney, Living will, Out of facility DNR (pink MOST or yellow form)  [yellow copy at bedside]  Pre-existing out of facility DNR order (yellow form or pink MOST form) Pink Most/Yellow Form available - Physician notified to receive inpatient order  "MOST" Form in Place? --         IV Access:   Peripheral IV   Procedures and diagnostic studies:   No results found.   Medical Consultants:   None.   Subjective:    [name] no complaints  Objective:    Vitals:   09/04/21 0615 09/04/21 1300 09/04/21 2033 09/05/21 0632  BP: 122/76 129/90 116/63 112/75  Pulse: 99 93 98 88  Resp: '18 16 18 18  '$ Temp: 98.7 F (37.1 C) 97.8 F (36.6 C) 99.2 F (37.3 C) 98.4 F (36.9 C)  TempSrc: Oral Oral Oral Oral  SpO2: 92% 96% 92% 93%  Weight:      Height:       SpO2: 93 % O2 Flow Rate (L/min): 2 L/min   Intake/Output Summary (Last 24 hours) at 09/05/2021 1110 Last data filed at 09/05/2021 1000 Gross per 24 hour  Intake  960 ml  Output 1000 ml  Net -40 ml   Filed Weights   08/29/21 1548  Weight: 74.3 kg    Exam: General exam: In no acute distress. Respiratory system: Good air movement and clear to auscultation. Cardiovascular system: S1 & S2 heard, RRR. No JVD. Gastrointestinal system: Abdomen is nondistended, soft and nontender.  Extremities: No pedal edema. Skin: No rashes, lesions or ulcers Psychiatry: Judgement and insight appear normal. Mood & affect appropriate.    Data Reviewed:    Labs: Basic Metabolic Panel: Recent Labs  Lab 08/30/21 0323 08/31/21 0332 09/01/21 0307 09/02/21 0359 09/04/21 1308  NA 143 143 139  138 144  K 3.8 3.7 3.3* 3.2* 3.9  CL 113* 109 106 104 108  CO2 '26 28 28 27 26  '$ GLUCOSE 88 120* 99 98 102*  BUN '14 15 23 21 '$ 31*  CREATININE 0.88 0.76 0.83 0.78 0.88  CALCIUM 8.7* 8.8* 8.7* 8.5* 8.8*  MG  --   --   --  2.2  --    GFR Estimated Creatinine Clearance: 43.3 mL/min (by C-G formula based on SCr of 0.88 mg/dL). Liver Function Tests: No results for input(s): AST, ALT, ALKPHOS, BILITOT, PROT, ALBUMIN in the last 168 hours. No results for input(s): LIPASE, AMYLASE in the last 168 hours. No results for input(s): AMMONIA in the last 168 hours. Coagulation profile No results for input(s): INR, PROTIME in the last 168 hours. COVID-19 Labs  No results for input(s): DDIMER, FERRITIN, LDH, CRP in the last 72 hours.  Lab Results  Component Value Date   SARSCOV2NAA NEGATIVE 05/26/2020   [place] NEGATIVE 04/28/2020   [place] NEGATIVE 05/11/2019   [place] NEGATIVE 05/07/2019    CBC: Recent Labs  Lab 08/30/21 0323 08/31/21 0332 09/01/21 0307 09/02/21 0359 09/04/21 1308  WBC 6.4 7.5 6.5 7.8 7.7  HGB 11.5* 10.7* 9.4* 9.4* 10.0*  HCT 38.2 35.3* 31.1* 30.6* 32.5*  MCV 86.6 87.2 86.1 85.2 86.4  PLT 190 190 163 190 239   Cardiac Enzymes: No results for input(s): CKTOTAL, CKMB, CKMBINDEX, TROPONINI in the last 168 hours. BNP (last 3 results) No results for input(s): PROBNP in the last 8760 hours. CBG: No results for input(s): GLUCAP in the last 168 hours. D-Dimer: No results for input(s): DDIMER in the last 72 hours. Hgb A1c: No results for input(s): HGBA1C in the last 72 hours. Lipid Profile: No results for input(s): CHOL, HDL, LDLCALC, TRIG, CHOLHDL, LDLDIRECT in the last 72 hours. Thyroid function studies: No results for input(s): TSH, T4TOTAL, T3FREE, THYROIDAB in the last 72 hours.  Invalid input(s): FREET3 Anemia work up: No results for input(s): VITAMINB12, FOLATE, FERRITIN, TIBC, IRON, RETICCTPCT in the last 72 hours. Sepsis Labs: Recent Labs  Lab  08/31/21 0332 09/01/21 0307 09/02/21 0359 09/04/21 1308  WBC 7.5 6.5 7.8 7.7   Microbiology Recent Results (from the past 240 hour(s))  Surgical pcr screen     Status: None   Collection Time: 08/29/21  6:31 PM   Specimen: Nasal Mucosa; Nasal Swab  Result Value Ref Range Status   MRSA, PCR NEGATIVE NEGATIVE Final   Staphylococcus aureus NEGATIVE NEGATIVE Final    Comment: (NOTE) The Xpert SA Assay (FDA approved for NASAL specimens in patients 4 years of age and older), is one component of a comprehensive surveillance program. It is not intended to diagnose infection nor to guide or monitor treatment. Performed at [facility], [place] [address], [place],  [address]      Medications:  amLODipine  2.5 mg Oral Daily   aspirin  81 mg Oral Daily   cholecalciferol  1,000 Units Oral Daily   enoxaparin (LOVENOX) injection  30 mg Subcutaneous Q24H   feeding supplement  237 mL Oral BID BM   furosemide  20 mg Oral Daily   lamoTRIgine  150 mg Oral BID   levETIRAcetam  250 mg Oral Daily   levETIRAcetam  500 mg Oral QHS   multivitamin with minerals  1 tablet Oral Daily   pantoprazole  40 mg Oral Daily   potassium chloride  20 mEq Oral Daily   pramipexole  0.5 mg Oral QHS   simvastatin  10 mg Oral QHS   umeclidinium bromide  1 puff Inhalation Daily   Continuous Infusions:    LOS: 7 days   Charlynne Cousins  [facility]  09/05/2021, 11:10 AM

## 2021-09-05 NOTE — TOC Progression Note (Addendum)
Transition of Care [facility]) - Progression Note   Patient Details  Name: [name] MRN: [id number] Date of Birth: 06-06-1932  Transition of [facility]) CM/SW [place], LCSW Phone Number: 09/05/2021, 12:32 PM  Clinical Narrative: CSW awaiting outcome of expedited appeal, which can take up to 72 hours.  Addendum: CSW received call from [facility] today at 1:50pm stating that the appeals agent CSW and patient started the appeals process with yesterday did not receive verbal consent from the patient for the outcome of the appeal to be released to CSW, so an appointment of representative (AOR) form will be faxed to CSW to complete and have the patient sign. CSW asked if this would delay the appeal. It was reported that the appeal/review will not be started until the AOR form is faxed back to [facility] at [phone]. CSW requested to file a complaint as this delays the patient's appeal outcome by over 24 hours as the appeal process was started yesterday morning at 9:45am. Reference # for the call is [id number]. Case # for the complaint is [id number].  CSW completed AOR paperwork with the patient. Patient became tearful stating, "it's all my fault" regarding the delay in appeal. Signed AOR form faxed to [facility]. CSW left VM for Katie at [facility] regarding the delay.  AOR form successfully faxed to [facility] at 2:28pm.  Expected Discharge Plan: [place] Barriers to Discharge: Insurance Authorization  Expected Discharge Plan and Services Expected Discharge Plan: [place] In-house Referral: Clinical Social Work Post Acute Care Choice: [place] Living arrangements for the past 2 months: [place]             DME Arranged: N/A DME Agency: NA  Readmission Risk Interventions     View : No data to display.

## 2021-09-05 NOTE — Progress Notes (Signed)
Physical Therapy Treatment Patient Details Name: [name] MRN: [id number] DOB: [birth year] Today's Date: 09/05/2021   History of Present Illness 88 year old female with a history of emphysema, hypertension, seizures, presents after a mechanical fall where she suffered a left hip fracture.  She did not have any syncopal event/loss of consciousness or head trauma.  She was seen by orthopedics and underwent operative management on 5/25.    PT Comments    +2 total assist for supine to sit and sit to stand. Used Tour manager for pivot to Psychologist, occupational. Pt able to maintain standing in [place] for ~15 seconds, then she fatigued and had to sit on the seat flaps. Pt stands with significantly flexed posture. Performed L hip exercises with assistance. Pt remains quite weak. SNF recommended.     Recommendations for follow up therapy are one component of a multi-disciplinary discharge planning process, led by the attending physician.  Recommendations may be updated based on patient status, additional functional criteria and insurance authorization.  Follow Up Recommendations  Skilled nursing-short term rehab (<3 hours/day)     Assistance Recommended at Discharge    Patient can return home with the following Two people to help with walking and/or transfers;A little help with bathing/dressing/bathroom;Assistance with cooking/housework;Assist for transportation;Help with stairs or ramp for entrance   Equipment Recommendations  None recommended by PT    Recommendations for Other Services       Precautions / Restrictions Precautions Precautions: Fall Restrictions Weight Bearing Restrictions: No LLE Weight Bearing: Weight bearing as tolerated     Mobility  Bed Mobility Overal bed mobility: Needs Assistance Bed Mobility: Supine to Sit     Supine to sit: Total assist, HOB elevated, +2 for physical assistance     General bed mobility comments: physical assistance for trunk and BLE with  increased time and use of bed pad to get BLE to EOB.    Transfers Overall transfer level: Needs assistance   Transfers: Sit to/from Stand, Bed to chair/wheelchair/BSC Sit to Stand: +2 physical assistance, +2 safety/equipment, Total assist           General transfer comment: +2 total assist for sit to stand, pt able to stand briefly in [place] to close seat flaps then sat on flaps for pivot to recliner. Transfer via Lift Equipment: [place]  Ambulation/Gait                   Stairs             Wheelchair Mobility    Modified Rankin (Stroke Patients Only)       Balance Overall balance assessment: Needs assistance Sitting-balance support: No upper extremity supported, Feet supported Sitting balance-Leahy Scale: Fair     Standing balance support: Bilateral upper extremity supported, Reliant on assistive device for balance Standing balance-Leahy Scale: Zero                              Cognition Arousal/Alertness: Awake/alert Behavior During Therapy: WFL for tasks assessed/performed Overall Cognitive Status: Within Functional Limits for tasks assessed                                          Exercises General Exercises - Lower Extremity Heel Slides: AAROM, 10 reps, Supine, Left Hip ABduction/ADduction: AAROM, Left, 10 reps, Supine    General Comments  Pertinent Vitals/Pain Pain Assessment Pain Location: L hip with activity, L knee with flexion (chronic) Pain Descriptors / Indicators: Grimacing, Guarding, Sore Pain Intervention(s): Limited activity within patient's tolerance, Monitored during session, Repositioned, Patient requesting pain meds-RN notified    Home Living                          Prior Function            PT Goals (current goals can now be found in the care plan section) Acute Rehab PT Goals Patient Stated Goal: Regain IND PT Goal Formulation: With patient Time For Goal Achievement:  09/14/21 Potential to Achieve Goals: Fair Progress towards PT goals: Progressing toward goals    Frequency    Min 3X/week      PT Plan Current plan remains appropriate    Co-evaluation              AM-PAC PT "6 Clicks" Mobility   Outcome Measure  Help needed turning from your back to your side while in a flat bed without using bedrails?: Total Help needed moving from lying on your back to sitting on the side of a flat bed without using bedrails?: Total Help needed moving to and from a bed to a chair (including a wheelchair)?: Total Help needed standing up from a chair using your arms (e.g., wheelchair or bedside chair)?: Total Help needed to walk in hospital room?: Total Help needed climbing 3-5 steps with a railing? : Total 6 Click Score: 6    End of Session Equipment Utilized During Treatment: Gait belt Activity Tolerance: Patient limited by fatigue;Patient limited by pain Patient left: in chair;with chair alarm set;with call bell/phone within reach Nurse Communication: Mobility status;Need for lift equipment PT Visit Diagnosis: Unsteadiness on feet (R26.81);Difficulty in walking, not elsewhere classified (R26.2);Muscle weakness (generalized) (M62.81);History of falling (Z91.81);Pain Pain - Right/Left: Left Pain - part of body: Hip     Time: 5456-2563 PT Time Calculation (min) (ACUTE ONLY): 31 min  Charges:  $Therapeutic Exercise: 8-22 mins $Therapeutic Activity: 8-22 mins                     Blondell Reveal Kistler PT 09/05/2021  Acute Rehabilitation Services Pager [phone] Office [phone]

## 2021-09-06 DIAGNOSIS — S72002A Fracture of unspecified part of neck of left femur, initial encounter for closed fracture: Secondary | ICD-10-CM | POA: Diagnosis not present

## 2021-09-06 DIAGNOSIS — I10 Essential (primary) hypertension: Secondary | ICD-10-CM | POA: Diagnosis not present

## 2021-09-06 DIAGNOSIS — S2242XA Multiple fractures of ribs, left side, initial encounter for closed fracture: Secondary | ICD-10-CM | POA: Diagnosis not present

## 2021-09-06 LAB — CREATININE, SERUM
Creatinine, Ser: 0.78 mg/dL (ref 0.44–1.00)
GFR, Estimated: >60 mL/min (ref >60)

## 2021-09-06 NOTE — TOC Progression Note (Signed)
Transition of Care [facility]) - Progression Note   Patient Details  Name: [name] MRN: [id number] Date of Birth: [birth year]  Transition of Care [facility]) CM/SW [place], LCSW Phone Number: 09/06/2021, 1:24 PM  Clinical Narrative: CSW received call from Jacqualin Combes. with [employer]'s expedited appeals to inform CSW that the appeal process has been started and the due date for the appeal outcome is 09/08/21. CSW was also notified any additional clinicals can be faxed to [phone]. Updated clinicals faxed to [facility] for review. CSW updated patient. TOC awaiting outcome of appeal.  Expected Discharge Plan: [place] Barriers to Discharge: Ship broker  Expected Discharge Plan and Services Expected Discharge Plan: [place] In-house Referral: Clinical Social Work Post Acute Care Choice: [place] Living arrangements for the past 2 months: [place]           DME Arranged: N/A DME Agency: NA  Readmission Risk Interventions     View : No data to display.

## 2021-09-06 NOTE — Progress Notes (Signed)
[facility] PROGRESS NOTE    Progress Note  KYLI SORTER  MOQ:[id number] DOB: 24-Nov-1932 DOA: 08/29/2021 PCP: [name], MD     Brief Narrative:   [name] is an 88 y.o. female past medical history of emphysema, essential hypertension and seizure presents after a mechanical fall suffering left hip fracture did not lose consciousness was seen by orthopedic surgery and underwent a period of management on 08/30/2021   Assessment/Plan:   Closed left hip fracture [facility]) Physical therapy evaluated the patient, patient needs to go to skilled nursing facility. Still waiting appeal decision from insurance denial for skilled nursing facility.  Essential hypertension: Elevated today but the previous 5 days have been well controlled amlodipine continued notably.  Normocytic anemia/acute blood loss anemia: Likely due to surgical intervention hemoglobin is now stable. MCV is borderline low RDW slightly high check an anemia panel to rule out iron deficiency anemia.  Seizure disorder: Continue Keppra.  GERD: Continue PPI.  COPD: Continue inhalers.  Hyperlipidemia: Continue statins.  Restless leg syndrome: Continue Mirapex.  Hypokalemia: Replete orally now resolved.   DVT prophylaxis: lovenox Family Communication:none Status is: Inpatient Remains inpatient appropriate because: Acute left hip fracture    Code Status:     Code Status Orders  (From admission, onward)           Start     Ordered   08/29/21 1601  Do not attempt resuscitation (DNR)  Continuous       Question Answer Comment  In the event of cardiac or respiratory ARREST Do not call a "code blue"   In the event of cardiac or respiratory ARREST Do not perform Intubation, CPR, defibrillation or ACLS   In the event of cardiac or respiratory ARREST Use medication by any route, position, wound care, and other measures to relive pain and suffering. May use oxygen, suction and manual treatment  of airway obstruction as needed for comfort.   Comments Confirmed w/ patient through multiple lines of questioning.      08/29/21 1600           Code Status History     Date Active Date Inactive Code Status Order ID Comments User Context   05/04/2019 0322 05/13/2019 2302 DNR [id number]  Phillips Grout, MD ED      Advance Directive Documentation    Flowsheet Row Most Recent Value  Type of Advance Directive Healthcare Power of Attorney, Living will, Out of facility DNR (pink MOST or yellow form)  [yellow copy at bedside]  Pre-existing out of facility DNR order (yellow form or pink MOST form) Pink Most/Yellow Form available - Physician notified to receive inpatient order  "MOST" Form in Place? --         IV Access:   Peripheral IV   Procedures and diagnostic studies:   No results found.   Medical Consultants:   None.   Subjective:    RYNN MARKIEWICZ no complaints  Objective:    Vitals:   09/05/21 0632 09/05/21 1246 09/05/21 2158 09/06/21 0549  BP: 112/75 131/73 (!) 145/77 (!) 160/105  Pulse: 88 78 89 91  Resp: '18 16 16 18  '$ Temp: 98.4 F (36.9 C) 97.8 F (36.6 C) 98.2 F (36.8 C) 98.2 F (36.8 C)  TempSrc: Oral Oral Oral Oral  SpO2: 93% 91% 94% 94%  Weight:      Height:       SpO2: 94 % O2 Flow Rate (L/min): 2 L/min   Intake/Output Summary (Last  24 hours) at 09/06/2021 4196 Last data filed at 09/06/2021 0600 Gross per 24 hour  Intake 840 ml  Output 500 ml  Net 340 ml    Filed Weights   08/29/21 1548  Weight: 74.3 kg    Exam: General exam: In no acute distress. Respiratory system: Good air movement and clear to auscultation. Cardiovascular system: S1 & S2 heard, RRR. No JVD. Gastrointestinal system: Abdomen is nondistended, soft and nontender.  Extremities: No pedal edema. Skin: No rashes, lesions or ulcers Psychiatry: Judgement and insight appear normal. Mood & affect appropriate.    Data Reviewed:    Labs: Basic Metabolic  Panel: Recent Labs  Lab 08/31/21 0332 09/01/21 0307 09/02/21 0359 09/04/21 1308 09/06/21 0449  NA 143 139 138 144  --   K 3.7 3.3* 3.2* 3.9  --   CL 109 106 104 108  --   CO2 '28 28 27 26  '$ --   GLUCOSE 120* 99 98 102*  --   BUN '15 23 21 '$ 31*  --   CREATININE 0.76 0.83 0.78 0.88 0.78  CALCIUM 8.8* 8.7* 8.5* 8.8*  --   MG  --   --  2.2  --   --     GFR Estimated Creatinine Clearance: 47.7 mL/min (by C-G formula based on SCr of 0.78 mg/dL). Liver Function Tests: No results for input(s): AST, ALT, ALKPHOS, BILITOT, PROT, ALBUMIN in the last 168 hours. No results for input(s): LIPASE, AMYLASE in the last 168 hours. No results for input(s): AMMONIA in the last 168 hours. Coagulation profile No results for input(s): INR, PROTIME in the last 168 hours. COVID-19 Labs  Recent Labs    09/05/21 1220  FERRITIN 31    Lab Results  Component Value Date   SARSCOV2NAA NEGATIVE 05/26/2020   SARSCOV2NAA NEGATIVE 04/28/2020   [place] NEGATIVE 05/11/2019   [place] NEGATIVE 05/07/2019    CBC: Recent Labs  Lab 08/31/21 0332 09/01/21 0307 09/02/21 0359 09/04/21 1308  WBC 7.5 6.5 7.8 7.7  HGB 10.7* 9.4* 9.4* 10.0*  HCT 35.3* 31.1* 30.6* 32.5*  MCV 87.2 86.1 85.2 86.4  PLT 190 163 190 239    Cardiac Enzymes: No results for input(s): CKTOTAL, CKMB, CKMBINDEX, TROPONINI in the last 168 hours. BNP (last 3 results) No results for input(s): PROBNP in the last 8760 hours. CBG: No results for input(s): GLUCAP in the last 168 hours. D-Dimer: No results for input(s): DDIMER in the last 72 hours. Hgb A1c: No results for input(s): HGBA1C in the last 72 hours. Lipid Profile: No results for input(s): CHOL, HDL, LDLCALC, TRIG, CHOLHDL, LDLDIRECT in the last 72 hours. Thyroid function studies: No results for input(s): TSH, T4TOTAL, T3FREE, THYROIDAB in the last 72 hours.  Invalid input(s): FREET3 Anemia work up: Recent Labs    09/05/21 1220  VITAMINB12 249  FOLATE 23.1   FERRITIN 31  TIBC 369  IRON 64  RETICCTPCT 3.2*   Sepsis Labs: Recent Labs  Lab 08/31/21 0332 09/01/21 0307 09/02/21 0359 09/04/21 1308  WBC 7.5 6.5 7.8 7.7    Microbiology Recent Results (from the past 240 hour(s))  Surgical pcr screen     Status: None   Collection Time: 08/29/21  6:31 PM   Specimen: Nasal Mucosa; Nasal Swab  Result Value Ref Range Status   MRSA, PCR NEGATIVE NEGATIVE Final   Staphylococcus aureus NEGATIVE NEGATIVE Final    Comment: (NOTE) The Xpert SA Assay (FDA approved for NASAL specimens in patients 22 years of age and older), is  one component of a comprehensive surveillance program. It is not intended to diagnose infection nor to guide or monitor treatment. Performed at [facility], [place] [address] [place], [place] [address]      Medications:    amLODipine  2.5 mg Oral Daily   aspirin  81 mg Oral Daily   cholecalciferol  1,000 Units Oral Daily   enoxaparin (LOVENOX) injection  30 mg Subcutaneous Q24H   feeding supplement  237 mL Oral BID BM   furosemide  20 mg Oral Daily   lamoTRIgine  150 mg Oral BID   levETIRAcetam  250 mg Oral Daily   levETIRAcetam  500 mg Oral QHS   multivitamin with minerals  1 tablet Oral Daily   pantoprazole  40 mg Oral Daily   potassium chloride  20 mEq Oral Daily   pramipexole  0.5 mg Oral QHS   simvastatin  10 mg Oral QHS   umeclidinium bromide  1 puff Inhalation Daily   Continuous Infusions:    LOS: 8 days   Charlynne Cousins  [facility]  09/06/2021, 8:22 AM

## 2021-09-07 DIAGNOSIS — S2242XA Multiple fractures of ribs, left side, initial encounter for closed fracture: Secondary | ICD-10-CM | POA: Diagnosis not present

## 2021-09-07 DIAGNOSIS — I10 Essential (primary) hypertension: Secondary | ICD-10-CM | POA: Diagnosis not present

## 2021-09-07 DIAGNOSIS — S72002A Fracture of unspecified part of neck of left femur, initial encounter for closed fracture: Secondary | ICD-10-CM | POA: Diagnosis not present

## 2021-09-07 NOTE — Progress Notes (Signed)
Physical Therapy Treatment Patient Details Name: [name] MRN: [id number] DOB: January 21, 1933 Today's Date: 09/07/2021   History of Present Illness 88 year old female with a history of emphysema, hypertension, seizures, presents after a mechanical fall where she suffered a left hip fracture.  She did not have any syncopal event/loss of consciousness or head trauma.  She was seen by orthopedics and underwent operative management on 5/25.    PT Comments    Pt progressing slowly toward goals. Remains motivated and demonstrated inr effort with bed mobility and transfers this session. Continue to recommend SNF level rehab post acute. Pt was in SNF at [facility] prior this admission.    Recommendations for follow up therapy are one component of a multi-disciplinary discharge planning process, led by the attending physician.  Recommendations may be updated based on patient status, additional functional criteria and insurance authorization.  Follow Up Recommendations  Skilled nursing-short term rehab (<3 hours/day)     Assistance Recommended at Discharge Frequent or constant Supervision/Assistance  Patient can return home with the following Two people to help with walking and/or transfers;Assistance with cooking/housework;Assist for transportation;Help with stairs or ramp for entrance;A lot of help with walking and/or transfers   Equipment Recommendations  None recommended by PT    Recommendations for Other Services       Precautions / Restrictions Precautions Precautions: Fall Restrictions Weight Bearing Restrictions: No LLE Weight Bearing: Weight bearing as tolerated     Mobility  Bed Mobility Overal bed mobility: Needs Assistance Bed Mobility: Supine to Sit, Sit to Supine Rolling: Mod assist, Max assist   Supine to sit: Mod assist, Max assist, +2 for physical assistance, +2 for safety/equipment Sit to supine: +2 for physical assistance, +2 for safety/equipment, Total assist   General  bed mobility comments: physical assistance for trunk and BLE with increased time. pt demonstrates incr effort assisting with elevation of trunk and RLE off bed    Transfers Overall transfer level: Needs assistance Equipment used: Rolling walker (2 wheels) Transfers: Sit to/from Stand Sit to Stand: Mod assist, Max assist, +2 physical assistance, +2 safety/equipment           General transfer comment: STS x3 with max assist of 2  in SARA stedy. pt requiring rest break while in stedy after 3rd trial. incr pt effort on 2nd and standing 3rd trials; deferred transfer to chair and returned pt to bed since she needed bed pan. Transfer via Lift Equipment: Stedy  Ambulation/Gait                   Stairs             Wheelchair Mobility    Modified Rankin (Stroke Patients Only)       Balance     Sitting balance-Leahy Scale: Fair Sitting balance - Comments: with multi-modal cues and assist to laterally wt shift for scooting. bed pad used to assist. able to maintain midline with UE support and close supervision; work on trunk extension with hip flexion this session Postural control: Right lateral lean, Posterior lean   Standing balance-Leahy Scale: Zero                              Cognition Arousal/Alertness: Awake/alert Behavior During Therapy: WFL for tasks assessed/performed Overall Cognitive Status: Within Functional Limits for tasks assessed  General Comments: pt is oriented x3 but seems to have intermittent confusion today        Exercises General Exercises - Lower Extremity Ankle Circles/Pumps: AROM, Both, 15 reps, Supine    General Comments        Pertinent Vitals/Pain Pain Assessment Pain Assessment: Faces Faces Pain Scale: Hurts little more Pain Location: L hip with activity, L knee with flexion (chronic) Pain Descriptors / Indicators: Grimacing, Guarding, Sore Pain Intervention(s):  Limited activity within patient's tolerance, Monitored during session, Repositioned    Home Living                          Prior Function            PT Goals (current goals can now be found in the care plan section) Acute Rehab PT Goals Patient Stated Goal: Regain IND PT Goal Formulation: With patient Time For Goal Achievement: 09/14/21 Potential to Achieve Goals: Fair Progress towards PT goals: Progressing toward goals    Frequency    Min 3X/week      PT Plan Current plan remains appropriate    Co-evaluation              AM-PAC PT "6 Clicks" Mobility   Outcome Measure  Help needed turning from your back to your side while in a flat bed without using bedrails?: Total Help needed moving from lying on your back to sitting on the side of a flat bed without using bedrails?: Total Help needed moving to and from a bed to a chair (including a wheelchair)?: Total Help needed standing up from a chair using your arms (e.g., wheelchair or bedside chair)?: Total Help needed to walk in hospital room?: Total Help needed climbing 3-5 steps with a railing? : Total 6 Click Score: 6    End of Session Equipment Utilized During Treatment: Gait belt;Other (comment) (stedy) Activity Tolerance: Patient tolerated treatment well Patient left: in bed;with call bell/phone within reach;with bed alarm set Nurse Communication: Mobility status PT Visit Diagnosis: Unsteadiness on feet (R26.81);Difficulty in walking, not elsewhere classified (R26.2);Muscle weakness (generalized) (M62.81);History of falling (Z91.81);Pain Pain - Right/Left: Left Pain - part of body: Hip     Time: 1210-1238 PT Time Calculation (min) (ACUTE ONLY): 28 min  Charges:  $Therapeutic Activity: 23-37 mins                     Baxter Flattery, PT  Acute Rehab Dept ([facility]) [phone] Pager [phone]  09/07/2021    [facility] 09/07/2021, 1:27 PM

## 2021-09-07 NOTE — Plan of Care (Signed)
Problem: Education: Goal: Knowledge of General Education information will improve Description: Including pain rating scale, medication(s)/side effects and non-pharmacologic comfort measures Outcome: Progressing   Problem: Health Behavior/Discharge Planning: Goal: Ability to manage health-related needs will improve Outcome: Progressing   Problem: Clinical Measurements: Goal: Ability to maintain clinical measurements within normal limits will improve Outcome: Progressing Goal: Respiratory complications will improve Outcome: Progressing   Problem: Activity: Goal: Risk for activity intolerance will decrease Outcome: Progressing   Problem: Nutrition: Goal: Adequate nutrition will be maintained Outcome: Progressing   Problem: Coping: Goal: Level of anxiety will decrease Outcome: Progressing   Problem: Elimination: Goal: Will not experience complications related to bowel motility Outcome: Progressing Goal: Will not experience complications related to urinary retention Outcome: Progressing   Problem: Pain Managment: Goal: General experience of comfort will improve Outcome: Progressing   Problem: Safety: Goal: Ability to remain free from injury will improve Outcome: Progressing   Problem: Skin Integrity: Goal: Risk for impaired skin integrity will decrease Outcome: Progressing   

## 2021-09-07 NOTE — Progress Notes (Signed)
[facility] PROGRESS NOTE    Progress Note  [name]  EKC:[id number] DOB: Dec 22, 1932 DOA: 08/29/2021 PCP: [name], MD     Brief Narrative:   [name] is an 88 y.o. female past medical history of emphysema, essential hypertension and seizure presents after a mechanical fall suffering left hip fracture did not lose consciousness was seen by orthopedic surgery and underwent a period of management on 08/30/2021   Assessment/Plan:   Closed left hip fracture [facility]) Physical therapy evaluated the patient, patient needs to go to skilled nursing facility. Still waiting appeal decision from insurance denial for skilled nursing facility.  Essential hypertension: Elevated today but the previous 5 days have been well controlled amlodipine continued notably.  Normocytic anemia/acute blood loss anemia: Likely due to surgical intervention hemoglobin is now stable. MCV is borderline low RDW slightly high check an anemia panel to rule out iron deficiency anemia.  Seizure disorder: Continue Keppra.  GERD: Continue PPI.  COPD: Continue inhalers.  Hyperlipidemia: Continue statins.  Restless leg syndrome: Continue Mirapex.  Hypokalemia: Replete orally now resolved.   DVT prophylaxis: lovenox Family Communication:none Status is: Inpatient Remains inpatient appropriate because: Acute left hip fracture    Code Status:     Code Status Orders  (From admission, onward)           Start     Ordered   08/29/21 1601  Do not attempt resuscitation (DNR)  Continuous       Question Answer Comment  In the event of cardiac or respiratory ARREST Do not call a "code blue"   In the event of cardiac or respiratory ARREST Do not perform Intubation, CPR, defibrillation or ACLS   In the event of cardiac or respiratory ARREST Use medication by any route, position, wound care, and other measures to relive pain and suffering. May use oxygen, suction and manual treatment  of airway obstruction as needed for comfort.   Comments Confirmed w/ patient through multiple lines of questioning.      08/29/21 1600           Code Status History     Date Active Date Inactive Code Status Order ID Comments User Context   05/04/2019 0322 05/13/2019 2302 DNR [id number]  Phillips Grout, MD ED      Advance Directive Documentation    Flowsheet Row Most Recent Value  Type of Advance Directive Healthcare Power of Attorney, Living will, Out of facility DNR (pink MOST or yellow form)  [yellow copy at bedside]  Pre-existing out of facility DNR order (yellow form or pink MOST form) Pink Most/Yellow Form available - Physician notified to receive inpatient order  "MOST" Form in Place? --         IV Access:   Peripheral IV   Procedures and diagnostic studies:   No results found.   Medical Consultants:   None.   Subjective:    [name] no complaints  Objective:    Vitals:   09/06/21 1424 09/06/21 2202 09/07/21 0649 09/07/21 0900  BP: 129/80 112/74 (!) 147/82 102/76  Pulse: 88 82 88 85  Resp: '18 16 18 16  '$ Temp: 98.2 F (36.8 C) 98.2 F (36.8 C) 98.3 F (36.8 C) 98.2 F (36.8 C)  TempSrc: Oral Oral Oral Oral  SpO2: 93% 95% 92% 96%  Weight:      Height:       SpO2: 96 % O2 Flow Rate (L/min): 2 L/min   Intake/Output Summary (Last 24  hours) at 09/07/2021 1117 Last data filed at 09/07/2021 0630 Gross per 24 hour  Intake 360 ml  Output 350 ml  Net 10 ml    Filed Weights   08/29/21 1548  Weight: 74.3 kg    Exam: General exam: In no acute distress. Respiratory system: Good air movement and clear to auscultation. Cardiovascular system: S1 & S2 heard, RRR. No JVD. Gastrointestinal system: Abdomen is nondistended, soft and nontender.  Extremities: No pedal edema. Skin: No rashes, lesions or ulcers Psychiatry: Judgement and insight appear normal. Mood & affect appropriate.    Data Reviewed:    Labs: Basic Metabolic Panel: Recent  Labs  Lab 09/01/21 0307 09/02/21 0359 09/04/21 1308 09/06/21 0449  NA 139 138 144  --   K 3.3* 3.2* 3.9  --   CL 106 104 108  --   CO2 '28 27 26  '$ --   GLUCOSE 99 98 102*  --   BUN 23 21 31*  --   CREATININE 0.83 0.78 0.88 0.78  CALCIUM 8.7* 8.5* 8.8*  --   MG  --  2.2  --   --     GFR Estimated Creatinine Clearance: 47.7 mL/min (by C-G formula based on SCr of 0.78 mg/dL). Liver Function Tests: No results for input(s): AST, ALT, ALKPHOS, BILITOT, PROT, ALBUMIN in the last 168 hours. No results for input(s): LIPASE, AMYLASE in the last 168 hours. No results for input(s): AMMONIA in the last 168 hours. Coagulation profile No results for input(s): INR, PROTIME in the last 168 hours. COVID-19 Labs  Recent Labs    09/05/21 1220  FERRITIN 31     Lab Results  Component Value Date   SARSCOV2NAA NEGATIVE 05/26/2020   SARSCOV2NAA NEGATIVE 04/28/2020   SARSCOV2NAA NEGATIVE 05/11/2019   [place] NEGATIVE 05/07/2019    CBC: Recent Labs  Lab 09/01/21 0307 09/02/21 0359 09/04/21 1308  WBC 6.5 7.8 7.7  HGB 9.4* 9.4* 10.0*  HCT 31.1* 30.6* 32.5*  MCV 86.1 85.2 86.4  PLT 163 190 239    Cardiac Enzymes: No results for input(s): CKTOTAL, CKMB, CKMBINDEX, TROPONINI in the last 168 hours. BNP (last 3 results) No results for input(s): PROBNP in the last 8760 hours. CBG: No results for input(s): GLUCAP in the last 168 hours. D-Dimer: No results for input(s): DDIMER in the last 72 hours. Hgb A1c: No results for input(s): HGBA1C in the last 72 hours. Lipid Profile: No results for input(s): CHOL, HDL, LDLCALC, TRIG, CHOLHDL, LDLDIRECT in the last 72 hours. Thyroid function studies: No results for input(s): TSH, T4TOTAL, T3FREE, THYROIDAB in the last 72 hours.  Invalid input(s): FREET3 Anemia work up: Recent Labs    09/05/21 1220  VITAMINB12 249  FOLATE 23.1  FERRITIN 31  TIBC 369  IRON 64  RETICCTPCT 3.2*    Sepsis Labs: Recent Labs  Lab 09/01/21 0307  09/02/21 0359 09/04/21 1308  WBC 6.5 7.8 7.7    Microbiology Recent Results (from the past 240 hour(s))  Surgical pcr screen     Status: None   Collection Time: 08/29/21  6:31 PM   Specimen: Nasal Mucosa; Nasal Swab  Result Value Ref Range Status   MRSA, PCR NEGATIVE NEGATIVE Final   Staphylococcus aureus NEGATIVE NEGATIVE Final    Comment: (NOTE) The Xpert SA Assay (FDA approved for NASAL specimens in patients 74 years of age and older), is one component of a comprehensive surveillance program. It is not intended to diagnose infection nor to guide or monitor treatment. Performed at  [facility], [place] [address], [place], [place] [address]      Medications:    amLODipine  2.5 mg Oral Daily   aspirin  81 mg Oral Daily   cholecalciferol  1,000 Units Oral Daily   enoxaparin (LOVENOX) injection  30 mg Subcutaneous Q24H   feeding supplement  237 mL Oral BID BM   furosemide  20 mg Oral Daily   lamoTRIgine  150 mg Oral BID   levETIRAcetam  250 mg Oral Daily   levETIRAcetam  500 mg Oral QHS   multivitamin with minerals  1 tablet Oral Daily   pantoprazole  40 mg Oral Daily   potassium chloride  20 mEq Oral Daily   pramipexole  0.5 mg Oral QHS   simvastatin  10 mg Oral QHS   umeclidinium bromide  1 puff Inhalation Daily   Continuous Infusions:    LOS: 9 days   Charlynne Cousins  [facility]  09/07/2021, 11:17 AM

## 2021-09-08 DIAGNOSIS — I10 Essential (primary) hypertension: Secondary | ICD-10-CM | POA: Diagnosis not present

## 2021-09-08 DIAGNOSIS — I440 Atrioventricular block, first degree: Secondary | ICD-10-CM

## 2021-09-08 DIAGNOSIS — G40909 Epilepsy, unspecified, not intractable, without status epilepticus: Secondary | ICD-10-CM

## 2021-09-08 DIAGNOSIS — S72002A Fracture of unspecified part of neck of left femur, initial encounter for closed fracture: Secondary | ICD-10-CM | POA: Diagnosis not present

## 2021-09-08 DIAGNOSIS — S2242XA Multiple fractures of ribs, left side, initial encounter for closed fracture: Secondary | ICD-10-CM | POA: Diagnosis not present

## 2021-09-08 MED ORDER — HYDROCODONE-ACETAMINOPHEN 10-325 MG PO TABS
0.5000 | ORAL_TABLET | ORAL | 0 refills | Status: DC | PRN
Start: 2021-09-08 — End: 2021-09-11

## 2021-09-08 NOTE — Progress Notes (Signed)
Occupational Therapy Treatment Patient Details Name: [name] MRN: [id number] DOB: Aug 31, 1932 Today's Date: 09/08/2021   History of present illness 88 year old female with a history of emphysema, hypertension, seizures, presents after a mechanical fall where she suffered a left hip fracture.  She did not have any syncopal event/loss of consciousness or head trauma.  She was seen by orthopedics and underwent operative management on 5/25.   OT comments  Treatment focused on functional mobility - transfer to chair and working on standing and then LLE exercises in seated position to reduce edema, pain and muscle stiffness. Patient tolerated well. Appears to have some intermittent confusion - she doesn't remember having therapist yesterday. However she is pleasant and works hard for therapist.   Recommendations for follow up therapy are one component of a multi-disciplinary discharge planning process, led by the attending physician.  Recommendations may be updated based on patient status, additional functional criteria and insurance authorization.    Follow Up Recommendations  Skilled nursing-short term rehab (<3 hours/day)    Assistance Recommended at Discharge Frequent or constant Supervision/Assistance  Patient can return home with the following  A lot of help with bathing/dressing/bathroom;Assistance with cooking/housework;Help with stairs or ramp for entrance;Assist for transportation;Two people to help with walking and/or transfers   Equipment Recommendations  None recommended by OT    Recommendations for Other Services      Precautions / Restrictions Precautions Precautions: Fall Restrictions Weight Bearing Restrictions: No LLE Weight Bearing: Weight bearing as tolerated       Mobility Bed Mobility Overal bed mobility: Needs Assistance Bed Mobility: Supine to Sit Rolling: Max assist, +2 for safety/equipment         General bed mobility comments: Assistance for LEs -  patient helping and mostly for trunk negotiation. Patient able to assist with scooting and squaring up at edge of bed with UEs.    Transfers Overall transfer level: Needs assistance Equipment used: Rolling walker (2 wheels) Transfers: Sit to/from Stand Sit to Stand: Mod assist, +2 physical assistance, From elevated surface           General transfer comment: Mod x 2 to stand with walker from elevated bed height. needed tactile cues to weight shift and verbal cues to get her hands on the walker appropriately. Unable to take a step and required return to seated position. Mod x 2 to pivot to recliner. Then worked on standing with stedy - max assist to power up from low recliner with patient pulling on stedy bar. Needed assistance to reach bar with RUE due to poor shoulder ROM. Once in standing needing initial physical assistance to get hips over knees and weight shift but then stood with min guard for 1 minute and 15 seconds before needing to sit.     Balance Overall balance assessment: Needs assistance Sitting-balance support: No upper extremity supported, Feet supported Sitting balance-Leahy Scale: Fair     Standing balance support: During functional activity, Reliant on assistive device for balance Standing balance-Leahy Scale: Poor                             ADL either performed or assessed with clinical judgement   ADL                                              Extremity/Trunk  Assessment Upper Extremity Assessment Upper Extremity Assessment: RUE deficits/detail;LUE deficits/detail RUE Deficits / Details: impaired shoulder ROM RUE Sensation: WNL RUE Coordination: WNL LUE Deficits / Details: impaired shoulder ROM LUE Sensation: WNL LUE Coordination: WNL            Vision Baseline Vision/History: 1 Wears glasses     Perception     Praxis      Cognition Arousal/Alertness: Awake/alert Behavior During Therapy: WFL for tasks  assessed/performed Overall Cognitive Status: Impaired/Different from baseline Area of Impairment: Memory, Awareness, Problem solving                     Memory: Decreased short-term memory     Awareness: Emergent Problem Solving: Requires verbal cues General Comments: Not alert to date or month. Didn't remember working with PT yesterday.        Exercises Other Exercises Other Exercises: Knee extension x 10 LLE, active assist with hip flexion x 10, tactile cues to minimally abduction/adduct    Shoulder Instructions       General Comments      Pertinent Vitals/ Pain       Pain Assessment Pain Assessment: Faces Faces Pain Scale: Hurts little more Pain Location: L hip with activity, L knee with flexion (chronic) Pain Descriptors / Indicators: Grimacing, Guarding, Sore Pain Intervention(s): Monitored during session  Home Living                                          Prior Functioning/Environment              Frequency  Min 2X/week        Progress Toward Goals  OT Goals(current goals can now be found in the care plan section)  Progress towards OT goals: Progressing toward goals  Acute Rehab OT Goals Patient Stated Goal: to be better OT Goal Formulation: With patient Time For Goal Achievement: 09/14/21 Potential to Achieve Goals: Good  Plan Discharge plan remains appropriate    Co-evaluation                 AM-PAC OT "6 Clicks" Daily Activity     Outcome Measure   Help from another person eating meals?: None Help from another person taking care of personal grooming?: A Little Help from another person toileting, which includes using toliet, bedpan, or urinal?: Total Help from another person bathing (including washing, rinsing, drying)?: A Lot Help from another person to put on and taking off regular upper body clothing?: A Little Help from another person to put on and taking off regular lower body clothing?: Total 6  Click Score: 14    End of Session Equipment Utilized During Treatment: Gait belt;Rolling walker (2 wheels);Other (comment) (stedy)  OT Visit Diagnosis: Other abnormalities of gait and mobility (R26.89);Pain Pain - Right/Left: Left Pain - part of body: Hip;Knee   Activity Tolerance Patient tolerated treatment well   Patient Left with call bell/phone within reach;with bed alarm set;with nursing/sitter in room;in chair   Nurse Communication Mobility status        Time: 4825-0037 OT Time Calculation (min): 32 min  Charges: OT General Charges $OT Visit: 1 Visit OT Treatments $Therapeutic Activity: 23-37 mins  Mika Anastasi, OTR/L [place]  Office [phone] Pager: [place] 09/08/2021, 10:29 AM

## 2021-09-08 NOTE — Progress Notes (Signed)
Voicemail left at facility to call back for report.

## 2021-09-08 NOTE — NC FL2 (Signed)
[place] LEVEL OF CARE SCREENING TOOL     IDENTIFICATION  Patient Name: [name] Birthdate: Oct 11, 1932 Sex: female Admission Date (Current Location): 08/29/2021  [facility] [place] Number:  Herbalist and Address:  [facility],  [address], [place]      Provider Number: [id number]  Attending Physician Name and Address:  Charlynne Cousins, MD  Relative Name and Phone Number:  [name] (sister) Ph: [phone]    Current Level of Care: Hospital Recommended Level of Care: [place] Prior Approval Number:    Date Approved/Denied:   PASRR Number: [id number]  Discharge Plan: SNF    Current Diagnoses: Patient Active Problem List   Diagnosis Date Noted   Closed left hip fracture ([place]) 08/29/2021   Heart block AV first degree 08/29/2021   Skin irritation 07/12/2021   History of retinal detachment 02/20/2021   Fuchs' corneal dystrophy of both eyes 02/20/2021   Choroidal nevus of right eye 02/20/2021   Pseudophakia, both eyes 02/20/2021   Dry skin dermatitis 01/29/2021   COVID-19 virus infection 09/25/2020   GERD (gastroesophageal reflux disease) 04/28/2020   Nondisplaced transverse fracture of left patella, subsequent encounter for closed fracture with nonunion 04/28/2020   Generalized osteoarthritis of multiple sites 10/05/2019   Olecranon bursitis of right elbow 10/04/2019   PAD (peripheral artery disease) ([place]) 06/23/2019   Slow transit constipation 05/21/2019   Generalized weakness 05/19/2019   Vasovagal near syncope 05/19/2019   History of urinary retention 05/19/2019   Mandibular fracture, closed ([place]) 05/19/2019   Humerus fracture 05/04/2019   Right thyroid nodule 02/28/2016   Abnormal thyroid uptake 11/10/2015   Coronary artery calcification 12/05/2014   Panlobular emphysema ([place]) 07/25/2014   Dyspnea and respiratory abnormality 07/25/2014   Multiple lung nodules on CT  07/25/2014   Smoking history 07/25/2014   Pain of right lower leg 05/23/2014   Fall at home, initial encounter 05/23/2014   Gait disorder 04/25/2014   Senile dementia ([place]) 04/25/2014   Essential tremor 04/25/2014   Advanced directives, counseling/discussion 02/18/2013   Seizure disorder ([place]) 10/15/2012   Cancer of skin, squamous cell 10/15/2012   Unspecified vitamin D deficiency 10/15/2012   Hyperlipidemia 10/15/2012   Edema 09/10/2012   Hordeolum externum (stye) 09/10/2012   Restless legs syndrome (RLS) 09/12/2011   Insomnia, unspecified 09/12/2011   COPD (chronic obstructive pulmonary disease) ([place]) 05/23/2011   Essential hypertension 01/31/2011   Senile osteoporosis 01/31/2011   Major depressive disorder, single episode 01/31/2011    Orientation RESPIRATION BLADDER Height & Weight     Self, Time, Situation, Place  O2 (2L/min) Continent Weight: 163 lb 11.2 oz (74.3 kg) Height:  '5\' 2"'$  (157.5 cm)  BEHAVIORAL SYMPTOMS/MOOD NEUROLOGICAL BOWEL NUTRITION STATUS    Convulsions/Seizures Continent Diet (Heart healthy)  AMBULATORY STATUS COMMUNICATION OF NEEDS Skin   Limited Assist Verbally Surgical wounds, Other (Comment), Skin abrasions (Abrasions & Ecchymosis: bilateral arms and legs)                       Personal Care Assistance Level of Assistance  Bathing, Feeding, Dressing Bathing Assistance: Limited assistance Feeding assistance: Independent Dressing Assistance: Limited assistance     Functional Limitations Info  Sight, Hearing, Speech Sight Info: Impaired Hearing Info: Adequate Speech Info: Adequate    SPECIAL CARE FACTORS FREQUENCY  PT (By licensed PT), OT (By licensed OT)     PT Frequency: 5x's/week OT Frequency: 5x's/week  Contractures Contractures Info: Not present    Additional Factors Info  Code Status, Allergies Code Status Info: DNR Allergies Info: Vioxx (Rofecoxib), Dyazide (Hydrochlorothiazide W-triamterene), Latex, Sulfa  Antibiotics, Sumycin (Tetracycline)           Current Medications (09/08/2021):  This is the current hospital active medication list Current Facility-Administered Medications  Medication Dose Route Frequency Provider Last Rate Last Admin   acetaminophen (TYLENOL) tablet 325-650 mg  325-650 mg Oral Q6H PRN Swinteck, Aaron Edelman, MD       acetaminophen (TYLENOL) tablet 650 mg  650 mg Oral Q6H PRN Swinteck, Aaron Edelman, MD       albuterol (PROVENTIL) (2.5 MG/3ML) 0.083% nebulizer solution 3 mL  3 mL Inhalation Daily PRN Swinteck, Aaron Edelman, MD       amLODipine (NORVASC) tablet 2.5 mg  2.5 mg Oral Daily Swinteck, Aaron Edelman, MD   2.5 mg at 09/07/21 3568   aspirin chewable tablet 81 mg  81 mg Oral Daily Rod Can, MD   81 mg at 09/07/21 0913   cholecalciferol (VITAMIN D3) tablet 1,000 Units  1,000 Units Oral Daily Rod Can, MD   1,000 Units at 09/07/21 0914   enoxaparin (LOVENOX) injection 30 mg  30 mg Subcutaneous Q24H Rod Can, MD   30 mg at 09/07/21 0916   feeding supplement (ENSURE ENLIVE / ENSURE PLUS) liquid 237 mL  237 mL Oral BID BM Charlynne Cousins, MD   237 mL at 09/07/21 1448   furosemide (LASIX) tablet 20 mg  20 mg Oral Daily Swinteck, Aaron Edelman, MD   20 mg at 09/07/21 0915   hydrALAZINE (APRESOLINE) injection 10 mg  10 mg Intravenous Q8H PRN Swinteck, Aaron Edelman, MD       lamoTRIgine (LAMICTAL) tablet 150 mg  150 mg Oral BID Rod Can, MD   150 mg at 09/07/21 2137   levETIRAcetam (KEPPRA) tablet 250 mg  250 mg Oral Daily Swinteck, Aaron Edelman, MD   250 mg at 09/07/21 0916   levETIRAcetam (KEPPRA) tablet 500 mg  500 mg Oral QHS Swinteck, Aaron Edelman, MD   500 mg at 09/07/21 2139   menthol-cetylpyridinium (CEPACOL) lozenge 3 mg  1 lozenge Oral PRN Rod Can, MD       Or   phenol (CHLORASEPTIC) mouth spray 1 spray  1 spray Mouth/Throat PRN Swinteck, Aaron Edelman, MD       metoCLOPramide (REGLAN) tablet 5-10 mg  5-10 mg Oral Q8H PRN Swinteck, Aaron Edelman, MD       Or   metoCLOPramide (REGLAN) injection  5-10 mg  5-10 mg Intravenous Q8H PRN Swinteck, Aaron Edelman, MD       morphine (PF) 2 MG/ML injection 0.5-1 mg  0.5-1 mg Intravenous Q2H PRN Swinteck, Aaron Edelman, MD       multivitamin with minerals tablet 1 tablet  1 tablet Oral Daily Swinteck, Brian, MD   1 tablet at 09/07/21 0913   ondansetron (ZOFRAN) tablet 4 mg  4 mg Oral Q6H PRN Swinteck, Aaron Edelman, MD       Or   ondansetron (ZOFRAN) injection 4 mg  4 mg Intravenous Q6H PRN Swinteck, Aaron Edelman, MD       oxyCODONE-acetaminophen (PERCOCET/ROXICET) 5-325 MG per tablet 1-2 tablet  1-2 tablet Oral Q4H PRN Kathie Dike, MD   1 tablet at 09/08/21 0351   pantoprazole (PROTONIX) EC tablet 40 mg  40 mg Oral Daily Rod Can, MD   40 mg at 09/07/21 0915   potassium chloride (KLOR-CON M) CR tablet 20 mEq  20 mEq Oral Daily Rod Can, MD  20 mEq at 09/07/21 0915   pramipexole (MIRAPEX) tablet 0.5 mg  0.5 mg Oral QHS Swinteck, Aaron Edelman, MD   0.5 mg at 09/07/21 2137   simvastatin (ZOCOR) tablet 10 mg  10 mg Oral QHS Swinteck, Aaron Edelman, MD   10 mg at 09/07/21 2138   umeclidinium bromide (INCRUSE ELLIPTA) 62.5 MCG/ACT 1 puff  1 puff Inhalation Daily Swinteck, Aaron Edelman, MD   1 puff at 09/07/21 1309     Discharge Medications: STOP taking these medications     amLODipine 2.5 MG tablet Commonly known as: NORVASC    CeraVe Crea    chlorhexidine 0.12 % solution Commonly known as: PERIDEX    furosemide 20 MG tablet Commonly known as: LASIX    oxyCODONE 5 MG immediate release tablet Commonly known as: Oxy IR/ROXICODONE           TAKE these medications     acetaminophen 500 MG tablet Commonly known as: TYLENOL Take 1,000 mg by mouth every 6 (six) hours as needed (pain).    aspirin 81 MG chewable tablet Commonly known as: Aspirin Childrens Chew 1 tablet (81 mg total) by mouth 2 (two) times daily with a meal. What changed: when to take this    cholecalciferol 1000 units tablet Commonly known as: VITAMIN D Take 1 tablet (1,000 Units total) by mouth  daily.    HYDROcodone-acetaminophen 10-325 MG tablet Commonly known as: Norco Take 0.5 tablets by mouth every 4 (four) hours as needed for up to 3 days for moderate pain or severe pain.    hydrocortisone cream 1 % Apply 1 application. topically 2 (two) times daily as needed (psoriasis).    hydrocortisone 2.5 % cream Apply 1 application. topically 2 (two) times daily as needed (psoriasis).    ketoconazole 2 % shampoo Commonly known as: NIZORAL Apply 1 application. topically See admin instructions. Monday, Thursday What changed: Another medication with the same name was removed. Continue taking this medication, and follow the directions you see here.    lamoTRIgine 150 MG tablet Commonly known as: LAMICTAL Take 150 mg by mouth 2 (two) times daily.    levETIRAcetam 500 MG tablet Commonly known as: KEPPRA Take 500 mg by mouth at bedtime.    levETIRAcetam 250 MG tablet Commonly known as: KEPPRA Take 250 mg by mouth daily. In the morning    loperamide 2 MG tablet Commonly known as: IMODIUM A-D Take 4 mg by mouth See admin instructions. 4 mg for 1st dose, then 2 mg after each loose stool for 48 hours. Not to exceed 16 mg in 24 hours.    memantine 10 MG tablet Commonly known as: NAMENDA TAKE 1 TABLET BY MOUTH TWICE DAILY.    nystatin powder Commonly known as: MYCOSTATIN/NYSTOP Apply 1 application. topically 2 (two) times daily as needed (Apply to urogenital area).    pantoprazole 40 MG tablet Commonly known as: PROTONIX Take 40 mg by mouth daily.    Potassium Chloride ER 20 MEQ Tbcr Take 20 mEq by mouth daily.    pramipexole 0.25 MG tablet Commonly known as: MIRAPEX Take 0.5 mg by mouth at bedtime.    ProAir RespiClick 591 (90 Base) MCG/ACT Aepb Generic drug: Albuterol Sulfate Inhale 2 puffs into the lungs daily as needed.    sennosides-docusate sodium 8.6-50 MG tablet Commonly known as: SENOKOT-S Take 2 tablets by mouth at bedtime.    simvastatin 10 MG  tablet Commonly known as: ZOCOR Take 10 mg by mouth at bedtime.    tiotropium 18 MCG inhalation  capsule Commonly known as: Spiriva HandiHaler INHALE CONTENTS OF ONE CAPSULE ONCE DAILY FOR COPD. What changed:  how much to take how to take this when to take this additional instructions    triamcinolone cream 0.1 % Commonly known as: KENALOG Apply 1 application. topically daily as needed (psoriasis).             Contact information for follow-up providers       Swinteck, Aaron Edelman, MD. Schedule an appointment as soon as possible for a visit in 2 week(s).   Specialty: Orthopedic Surgery Why: For suture removal, For wound re-check Contact information: [address] [place] [address] [phone]      Relevant Imaging Results:  Relevant Lab Results:   Additional Information SSN: [id number]  Ross Ludwig, LCSW

## 2021-09-08 NOTE — TOC Transition Note (Signed)
Transition of Care [facility]) - CM/SW Discharge Note   Patient Details  Name: [name] MRN: [id number] Date of Birth: [birth year]  [facility] CM/SW Contact:  Ross Ludwig, LCSW Phone Number: 09/08/2021, 10:43 AM   Clinical Narrative:     CSW received message that patient has won her appeal and the denial was overturned.  [facility] Medicare reference number [id number], next review June 7th. CSW spoke to [facility] on call nurse and confirmed patient can return today.  CSW updated bedside nurse, attending physician, and patient, Tywan requested DC summary and FL2 be included in patient's packet.    Patient to be d/c'ed today to [facility] room [address].  Patient and family agreeable to plans will transport via ems RN to call report to [facility], [phone].     Final next level of care: Skilled Nursing Facility Barriers to Discharge: Barriers Resolved   Patient Goals and CMS Choice Patient states their goals for this hospitalization and ongoing recovery are:: To return to [facility] for therapy. CMS Medicare.gov Compare Post Acute Care list provided to:: Patient Choice offered to / list presented to : Patient  Discharge Placement   Existing PASRR number confirmed : 09/08/21          Patient chooses bed at: [facility] Patient to be transferred to facility by: [facility] Name of family member notified: Patient to notify her family. Patient and family notified of of transfer: 09/08/21  Discharge Plan and Services In-house Referral: Clinical Social Work   Post Acute Care Choice: [place]          DME Arranged: N/A DME Agency: NA                  Social Determinants of Health (SDOH) Interventions     Readmission Risk Interventions     View : No data to display.

## 2021-09-08 NOTE — Plan of Care (Signed)
Discharge instructions and report were given to facility. All questions were answered. Patient was transported to facility by [facility]. ? ?

## 2021-09-08 NOTE — Discharge Summary (Signed)
Physician Discharge Summary  [name] HDQ:[id number] DOB: 1932/10/13 DOA: 08/29/2021  PCP: [name], MD  Admit date: 08/29/2021 Discharge date: 09/08/2021  Admitted From: Home Disposition:  SNF  Recommendations for Outpatient Follow-up:  Follow up with PCP in 1-2 weeks We will go to skilled nursing facility  Home Health:No Equipment/Devices:None  Discharge Condition:Stable CODE STATUS:DNR Diet recommendation: Heart Healthy   Brief/Interim Summary: 88 y.o. female past medical history of emphysema, essential hypertension and seizure presents after a mechanical fall suffering left hip fracture did not lose consciousness was seen by orthopedic surgery and underwent a period of management on 08/30/2021  Discharge Diagnoses:  Principal Problem:   Closed left hip fracture ([place]) Active Problems:   Senile dementia ([place])   Restless legs syndrome (RLS)   Essential hypertension   Seizure disorder ([place])   Hyperlipidemia   Fall at home, initial encounter   Panlobular emphysema ([place])   GERD (gastroesophageal reflux disease)   Heart block AV first degree  Mechanical fall leading to left hip fracture: Left hip x-ray showed acute mildly displaced intertrochanteric femur fracture. [place] surgery was consulted and she underwent under Perative management on 08/30/2021. Per Ortho she will need aspirin upon DC. Physical therapy evaluated the patient and recommended skilled nursing facility.  Essential hypertension: Amlodipine was held blood pressure remained stable will have to be reevaluated and antihypertensive medications titrated as an outpatient.  Seizure disorder: Continue Keppra no changes made.  GERD: Continue PPI no changes made.  COPD: Continue inhalers no changes made.  Hyperlipidemia: Continue statins no changes made.  Restless leg syndrome: Continue Mirapex no changes made.  Electrolytes were repleted orally now resolved.  Discharge  Instructions  Discharge Instructions     Diet - low sodium heart healthy   Complete by: As directed    Increase activity slowly   Complete by: As directed    No wound care   Complete by: As directed       Allergies as of 09/08/2021       Reactions   Vioxx [rofecoxib] Shortness Of Breath   Dyazide [hydrochlorothiazide W-triamterene] Other (See Comments)   Lowers blood pressure too much   Latex Other (See Comments)   Swelling   Sulfa Antibiotics Nausea And Vomiting   Sumycin [tetracycline]    Can't take due to drug reaction        Medication List     STOP taking these medications    amLODipine 2.5 MG tablet Commonly known as: NORVASC   CeraVe Crea   chlorhexidine 0.12 % solution Commonly known as: PERIDEX   furosemide 20 MG tablet Commonly known as: LASIX   oxyCODONE 5 MG immediate release tablet Commonly known as: Oxy IR/ROXICODONE       TAKE these medications    acetaminophen 500 MG tablet Commonly known as: TYLENOL Take 1,000 mg by mouth every 6 (six) hours as needed (pain).   aspirin 81 MG chewable tablet Commonly known as: Aspirin Childrens Chew 1 tablet (81 mg total) by mouth 2 (two) times daily with a meal. What changed: when to take this   cholecalciferol 1000 units tablet Commonly known as: VITAMIN D Take 1 tablet (1,000 Units total) by mouth daily.   HYDROcodone-acetaminophen 10-325 MG tablet Commonly known as: Norco Take 0.5 tablets by mouth every 4 (four) hours as needed for up to 3 days for moderate pain or severe pain.   hydrocortisone cream 1 % Apply 1 application. topically 2 (two) times daily as needed (psoriasis).  hydrocortisone 2.5 % cream Apply 1 application. topically 2 (two) times daily as needed (psoriasis).   ketoconazole 2 % shampoo Commonly known as: NIZORAL Apply 1 application. topically See admin instructions. Monday, Thursday What changed: Another medication with the same name was removed. Continue taking this  medication, and follow the directions you see here.   lamoTRIgine 150 MG tablet Commonly known as: LAMICTAL Take 150 mg by mouth 2 (two) times daily.   levETIRAcetam 500 MG tablet Commonly known as: KEPPRA Take 500 mg by mouth at bedtime.   levETIRAcetam 250 MG tablet Commonly known as: KEPPRA Take 250 mg by mouth daily. In the morning   loperamide 2 MG tablet Commonly known as: IMODIUM A-D Take 4 mg by mouth See admin instructions. 4 mg for 1st dose, then 2 mg after each loose stool for 48 hours. Not to exceed 16 mg in 24 hours.   memantine 10 MG tablet Commonly known as: NAMENDA TAKE 1 TABLET BY MOUTH TWICE DAILY.   nystatin powder Commonly known as: MYCOSTATIN/NYSTOP Apply 1 application. topically 2 (two) times daily as needed (Apply to urogenital area).   pantoprazole 40 MG tablet Commonly known as: PROTONIX Take 40 mg by mouth daily.   Potassium Chloride ER 20 MEQ Tbcr Take 20 mEq by mouth daily.   pramipexole 0.25 MG tablet Commonly known as: MIRAPEX Take 0.5 mg by mouth at bedtime.   ProAir RespiClick 308 (90 Base) MCG/ACT Aepb Generic drug: Albuterol Sulfate Inhale 2 puffs into the lungs daily as needed.   sennosides-docusate sodium 8.6-50 MG tablet Commonly known as: SENOKOT-S Take 2 tablets by mouth at bedtime.   simvastatin 10 MG tablet Commonly known as: ZOCOR Take 10 mg by mouth at bedtime.   tiotropium 18 MCG inhalation capsule Commonly known as: Spiriva HandiHaler INHALE CONTENTS OF ONE CAPSULE ONCE DAILY FOR COPD. What changed:  how much to take how to take this when to take this additional instructions   triamcinolone cream 0.1 % Commonly known as: KENALOG Apply 1 application. topically daily as needed (psoriasis).        Contact information for follow-up providers     Swinteck, Aaron Edelman, MD. Schedule an appointment as soon as possible for a visit in 2 week(s).   Specialty: Orthopedic Surgery Why: For suture removal, For wound  re-check Contact information: [address] [address] [phone]              Contact information for after-discharge care     Destination     [name] .   Service: Skilled Nursing Contact information: [place] [phone]                    Allergies  Allergen Reactions   Vioxx [Rofecoxib] Shortness Of Breath   Dyazide [Hydrochlorothiazide W-Triamterene] Other (See Comments)    Lowers blood pressure too much   Latex Other (See Comments)    Swelling   Sulfa Antibiotics Nausea And Vomiting   Sumycin [Tetracycline]     Can't take due to drug reaction    Consultations: n orthopedic surgery   Procedures/Studies: DG Chest 1 View  Result Date: 08/29/2021 CLINICAL DATA:  fall, pain EXAM: CHEST  1 VIEW COMPARISON:  05/10/2019 FINDINGS: Chronically elevated left hemidiaphragm with left basilar atelectasis. Cardiomediastinal silhouette is unchanged. No large pleural effusion. No visible pneumothorax. Left reverse total shoulder arthroplasty. Right glenohumeral osteoarthritis with appearance of a sclerotic humeral head, possibly  reflecting AVN. Possible anterior left lower rib fractures involving ribs 8 and 9. Dextroconvex lower thoracic/upper lumbar curvature. IMPRESSION: Possible nondisplaced fractures involving the anterior left 8 and ninth ribs. Chronically elevated left hemidiaphragm with left basilar subsegmental atelectasis. Sclerotic appearance of the right humeral head possibly reflecting AVN. Electronically Signed   By: Maurine Simmering M.D.   On: 08/29/2021 09:34   Pelvis Portable  Result Date: 08/30/2021 CLINICAL DATA:  Status post left femur ORIF EXAM: PORTABLE PELVIS 1-2 VIEWS COMPARISON:  08/29/2021 FINDINGS: Status post gamma nail fixation of intertrochanteric/basicervical femur fracture. Components appear well positioned. Lesser trochanter is displaced relative  to the preoperative imaging. IMPRESSION: Status post gamma nail fixation. Good position and alignment, with exception that the lesser trochanter is now displaced from its original position. Electronically Signed   By: Nelson Chimes M.D.   On: 08/30/2021 17:52   CT HIP LEFT WO CONTRAST  Addendum Date: 08/29/2021   ADDENDUM REPORT: 08/29/2021 14:44 ADDENDUM: Incidental note of an enlarged left inguinal lymph node measuring up to 3.6 by 1.7 by 4.3 cm. This is nonspecific and may be reactive, however cannot exclude malignant involvement. This is increased from 2.4 by 0.9 cm on remote 11/08/2015 PET-CT. Recommend clinical correlation. Electronically Signed   By: Yvonne Kendall M.D.   On: 08/29/2021 14:44   Result Date: 08/29/2021 CLINICAL DATA:  Unwitnessed fall. Hip surgical planning. Left hip pain and shortening and rotation. CT planning for surgery. EXAM: CT OF THE LEFT HIP WITHOUT CONTRAST TECHNIQUE: Multidetector CT imaging of the left hip was performed according to the standard protocol. Multiplanar CT image reconstructions were also generated. RADIATION DOSE REDUCTION: This exam was performed according to the departmental dose-optimization program which includes automated exposure control, adjustment of the mA and/or kV according to patient size and/or use of iterative reconstruction technique. COMPARISON:  Left hip radiographs 08/29/2021; CT abdomen and pelvis 10/20/2015 FINDINGS: Bones/Joint/Cartilage Mild pubic symphysis joint space narrowing and peripheral degenerative spurring. No cortical erosion or suspicious bone marrow lesion is seen, with attention to the superomedial aspect of the right pubic body questioned on radiograph. The relative lucency may be due to overlying bowel gas within the rectum on that radiograph. There is a comminuted acute fracture of the distal femoral neck and intertrochanteric proximal left femur. There is mild posterior angulation of the intervening fracture fragments (axial  series 2, image 47). External rotation of the greater trochanter region of the proximal femur. Multiple fracture lines extend through the greater trochanter. Mild-to-moderate superior impaction of the distal fracture component. Mild-to-moderate varus angulation. Nondisplaced fracture line extends through the anterior aspect of the lesser trochanter (coronal series 6, image 48). Mild-to-moderate left femoroacetabular joint space narrowing and peripheral osteophytosis. Partial visualization of old healed right inferior pubic ramus fracture. Ligaments Suboptimally assessed by CT. Muscles and Tendons Normal density and size of the regional musculature. Soft tissues Mild diverticulosis at the junction of the distal descending colon and proximal sigmoid colon. There is a wide-mouth partially visualized midline ventral hernia of the pelvis seen diffusely. At the inferior aspect, there is a component that is smaller in transverse dimension and contains mesenteric fat and nondistended loops of small bowel, measuring up to 4.8 x 4.1 cm (transverse by AP, as measured on axial series 2, image 50). There also appears to be a wide mouth hernia lateral to the rectus abdominis musculature within the anterior left pelvis containing mesenteric fat and nondistended loops of small bowel. The uterus appears to be surgically absent. IMPRESSION: 1.  Acute, comminuted, mildly displaced, mild to moderately impacted, and mildly varus angulated proximal left femoral fracture of the junction of the distal femoral neck and intertrochanteric region. 2. Mild pubic symphysis osteoarthritis. No cortical erosion or suspicious bone marrow lesion is seen. 3. Old healed right inferior pubic ramus fracture. 4. Midline and left paramidline ventral abdominal and pelvic hernias containing mesenteric fat and nondistended loops of small bowel. Electronically Signed: By: Yvonne Kendall M.D. On: 08/29/2021 11:43   DG Knee Left Port  Result Date:  08/29/2021 CLINICAL DATA:  Acute left hip fracture.  Pain. EXAM: PORTABLE LEFT KNEE - 1-2 VIEW COMPARISON:  Left knee radiographs 04/26/2020 FINDINGS: There are 2 new screws from inferior approach fixating the previously seen transverse fracture of the mid height of the patella. There is now normal alignment of the fracture components with resolution of the prior diastasis. Moderate patellofemoral joint space narrowing and peripheral osteophytosis. Mild-to-moderate medial and lateral compartment joint space narrowing and peripheral osteophytosis. No acute fracture or dislocation. No joint effusion. Resolution of the prior prepatellar soft tissue swelling/hematoma. IMPRESSION: Interval ORIF of the prior transverse fracture of the midpole of the patella with resolution of the prior bone distraction. Electronically Signed   By: Yvonne Kendall M.D.   On: 08/29/2021 11:45   DG C-Arm 1-60 Min-No Report  Result Date: 08/30/2021 Fluoroscopy was utilized by the requesting physician.  No radiographic interpretation.   DG HIP UNILAT WITH PELVIS 1V LEFT  Result Date: 08/30/2021 CLINICAL DATA:  Left IM nail EXAM: DG HIP (WITH OR WITHOUT PELVIS) 1V*L*; DG C-ARM 1-60 MIN-NO REPORT COMPARISON:  CT left hip dated 08/29/2021 Fluoroscopy time: 1 minute 8 seconds 16.98 mGy FINDINGS: IM nail with dynamic hip screw fixation of an intertrochanteric left hip fracture. Fracture fragments are in near anatomic alignment and position. IMPRESSION: Status post ORIF of a left hip fracture, as above. Electronically Signed   By: Julian Hy M.D.   On: 08/30/2021 20:09   DG Hip Unilat With Pelvis 2-3 Views Left  Result Date: 08/29/2021 CLINICAL DATA:  fall, pain EXAM: DG HIP (WITH OR WITHOUT PELVIS) 2-3V LEFT COMPARISON:  CT 10/20/2015 FINDINGS: There is an acute, mildly displaced left-sided intertrochanteric fracture. There are chronic right superior and inferior pubic rami fractures. There is an indistinct appearance of the  superior cortex of the right pubic body with adjacent osseous lucency. Mild, left greater than right hip osteoarthritis. Mild degenerative changes of the sacroiliac joints. IMPRESSION: Acute, mildly displaced left intertrochanteric femur fracture. Possible lucent lesion within the right superior pubic body, recommend pelvis CT. Chronic right superior and inferior pubic rami fractures. Electronically Signed   By: Maurine Simmering M.D.   On: 08/29/2021 09:29   (Echo, Carotid, EGD, Colonoscopy, ERCP)    Subjective: No complaints pain control.  Discharge Exam: Vitals:   09/07/21 2042 09/08/21 0349  BP: (!) 131/94 119/79  Pulse: 87 89  Resp: 18 18  Temp: 99 F (37.2 C) 99.2 F (37.3 C)  SpO2: 94% 93%   Vitals:   09/07/21 0900 09/07/21 1410 09/07/21 2042 09/08/21 0349  BP: 102/76 117/75 (!) 131/94 119/79  Pulse: 85 83 87 89  Resp: '16 18 18 18  '$ Temp: 98.2 F (36.8 C) 98.7 F (37.1 C) 99 F (37.2 C) 99.2 F (37.3 C)  TempSrc: Oral Oral Oral Oral  SpO2: 96% 96% 94% 93%  Weight:      Height:        General: Pt is alert, awake, not in acute  distress Cardiovascular: RRR, S1/S2 +, no rubs, no gallops Respiratory: CTA bilaterally, no wheezing, no rhonchi Abdominal: Soft, NT, ND, bowel sounds + Extremities: no edema, no cyanosis    The results of significant diagnostics from this hospitalization (including imaging, microbiology, ancillary and laboratory) are listed below for reference.     Microbiology: Recent Results (from the past 240 hour(s))  Surgical pcr screen     Status: None   Collection Time: 08/29/21  6:31 PM   Specimen: Nasal Mucosa; Nasal Swab  Result Value Ref Range Status   MRSA, PCR NEGATIVE NEGATIVE Final   Staphylococcus aureus NEGATIVE NEGATIVE Final    Comment: (NOTE) The Xpert SA Assay (FDA approved for NASAL specimens in patients 31 years of age and older), is one component of a comprehensive surveillance program. It is not intended to diagnose infection nor  to guide or monitor treatment. Performed at [facility], [place] [address], [place],  [address]      Labs: BNP (last 3 results) No results for input(s): BNP in the last 8760 hours. Basic Metabolic Panel: Recent Labs  Lab 09/02/21 0359 09/04/21 1308 09/06/21 0449  NA 138 144  --   K 3.2* 3.9  --   CL 104 108  --   CO2 27 26  --   GLUCOSE 98 102*  --   BUN 21 31*  --   CREATININE 0.78 0.88 0.78  CALCIUM 8.5* 8.8*  --   MG 2.2  --   --    Liver Function Tests: No results for input(s): AST, ALT, ALKPHOS, BILITOT, PROT, ALBUMIN in the last 168 hours. No results for input(s): LIPASE, AMYLASE in the last 168 hours. No results for input(s): AMMONIA in the last 168 hours. CBC: Recent Labs  Lab 09/02/21 0359 09/04/21 1308  WBC 7.8 7.7  HGB 9.4* 10.0*  HCT 30.6* 32.5*  MCV 85.2 86.4  PLT 190 239   Cardiac Enzymes: No results for input(s): CKTOTAL, CKMB, CKMBINDEX, TROPONINI in the last 168 hours. BNP: Invalid input(s): POCBNP CBG: No results for input(s): GLUCAP in the last 168 hours. D-Dimer No results for input(s): DDIMER in the last 72 hours. Hgb A1c No results for input(s): HGBA1C in the last 72 hours. Lipid Profile No results for input(s): CHOL, HDL, LDLCALC, TRIG, CHOLHDL, LDLDIRECT in the last 72 hours. Thyroid function studies No results for input(s): TSH, T4TOTAL, T3FREE, THYROIDAB in the last 72 hours.  Invalid input(s): FREET3 Anemia work up Recent Labs    09/05/21 1220  VITAMINB12 249  FOLATE 23.1  FERRITIN 31  TIBC 369  IRON 64  RETICCTPCT 3.2*   Urinalysis    Component Value Date/Time   COLORURINE YELLOW 05/07/2019 [place] 05/07/2019 1818   LABSPEC 1.021 05/07/2019 1818   PHURINE 5.0 05/07/2019 1818   GLUCOSEU NEGATIVE 05/07/2019 1818   HGBUR SMALL (A) 05/07/2019 1818   BILIRUBINUR NEGATIVE 05/07/2019 1818   BILIRUBINUR neg 06/08/2013 1019   KETONESUR NEGATIVE 05/07/2019 1818   PROTEINUR  NEGATIVE 05/07/2019 1818   UROBILINOGEN negative 06/08/2013 1019   UROBILINOGEN 0.2 08/16/2009 1549   NITRITE NEGATIVE 05/07/2019 1818   LEUKOCYTESUR NEGATIVE 05/07/2019 1818   Sepsis Labs Invalid input(s): PROCALCITONIN,  WBC,  LACTICIDVEN Microbiology Recent Results (from the past 240 hour(s))  Surgical pcr screen     Status: None   Collection Time: 08/29/21  6:31 PM   Specimen: Nasal Mucosa; Nasal Swab  Result Value Ref Range Status   MRSA, PCR NEGATIVE  NEGATIVE Final   Staphylococcus aureus NEGATIVE NEGATIVE Final    Comment: (NOTE) The Xpert SA Assay (FDA approved for NASAL specimens in patients 73 years of age and older), is one component of a comprehensive surveillance program. It is not intended to diagnose infection nor to guide or monitor treatment. Performed at [facility], [place] [address] [place], [place] [address]     SIGNED:   Charlynne Cousins, MD  [facility] 09/08/2021, 10:14 AM Pager   If 7PM-7AM, please contact night-coverage [email or web address] Password [email or web address]

## 2021-09-10 ENCOUNTER — Non-Acute Institutional Stay (SKILLED_NURSING_FACILITY): Payer: Medicare PPO | Admitting: Adult Health

## 2021-09-10 ENCOUNTER — Encounter: Payer: Self-pay | Admitting: Adult Health

## 2021-09-10 DIAGNOSIS — S72002S Fracture of unspecified part of neck of left femur, sequela: Secondary | ICD-10-CM | POA: Diagnosis not present

## 2021-09-10 DIAGNOSIS — F0390 Unspecified dementia without behavioral disturbance: Secondary | ICD-10-CM

## 2021-09-10 DIAGNOSIS — G40909 Epilepsy, unspecified, not intractable, without status epilepticus: Secondary | ICD-10-CM | POA: Diagnosis not present

## 2021-09-10 DIAGNOSIS — G2581 Restless legs syndrome: Secondary | ICD-10-CM | POA: Diagnosis not present

## 2021-09-10 DIAGNOSIS — K219 Gastro-esophageal reflux disease without esophagitis: Secondary | ICD-10-CM | POA: Diagnosis not present

## 2021-09-10 DIAGNOSIS — J431 Panlobular emphysema: Secondary | ICD-10-CM | POA: Diagnosis not present

## 2021-09-10 DIAGNOSIS — S72002A Fracture of unspecified part of neck of left femur, initial encounter for closed fracture: Secondary | ICD-10-CM | POA: Diagnosis not present

## 2021-09-10 DIAGNOSIS — I10 Essential (primary) hypertension: Secondary | ICD-10-CM

## 2021-09-10 NOTE — Progress Notes (Signed)
Location:  [place] Room Number: 29-A Place of Service:  SNF (31) Provider:  Durenda Age, DNP, FNP-BC  Patient Care Team: [name], MD as PCP - General (Internal Medicine) [name], MD as Consulting Physician (Ophthalmology) [name], MD as Consulting Physician (Cardiology) [name], MD as Consulting Physician (Dermatology) [name], MD as Consulting Physician (Orthopedic Surgery) [name], MD as Consulting Physician (Orthopedic Surgery) [name], MD as Consulting Physician (Orthopedic Surgery) Wynne, Keddie, Man X, NP as Nurse Practitioner (Nurse Practitioner) [name], MD (Inactive) as Consulting Physician (Neurology)  Extended Emergency Contact Information Primary Emergency Contact: [name],[name] Address: [address]          [place], [place] [address] [name] of [place] Phone: [phone] Relation: Sister Secondary Emergency Contact: [name] [place] of [place] Phone: [phone] Mobile Phone: [phone] Relation: Sister  Code Status:  DNR  Goals of care: Advanced Directive information    09/10/2021    1:31 PM  Advanced Directives  Does Patient Have a Medical Advance Directive? Yes  Type of Advance Directive Out of facility DNR (pink MOST or yellow form)  Does patient want to make changes to medical advance directive? No - Patient declined  Pre-existing out of facility DNR order (yellow form or pink MOST form) Pink MOST/Yellow Form most recent copy in chart - Physician notified to receive inpatient order     Chief Complaint  Patient presents with   Hospitalization Follow-up    HPI:  Pt is a 88 y.o. female who was re-admitted to [facility] on 09/09/21 post hospital admission 08/29/2021 to 09/08/21.  She is a long-term care resident of [facility] who had a fall and sustained a mildly displaced intertrochanteric  femur fracture.  Orthopedic surgery was consulted and performed operative management on 08/30/2021.   She was seen in her room today. She was discharged on Norco 10-325 mg 1/2 tab Q 4 hours PRN. She is    Also on Oxy IR 5 mg Q 6 hours PRN for pain. She stated that she prefers Oxy IR over Norco as her PRN pain medication.  Past Medical History:  Diagnosis Date   Acute bronchitis 05/23/2011   Acute upper respiratory infections of unspecified site 05/23/2011   Arthritis    Chronic airway obstruction, not elsewhere classified 05/23/2011   Disturbance of salivary secretion 01/31/2011   Dizziness and giddiness 01/31/2011   Dyspnea    Essential tremor 04/25/2014   External hemorrhoids without mention of complication 03/47/4259   Gait disorder 04/25/2014   Insomnia, unspecified 09/12/2011   Lumbago 01/31/2011   Major depressive disorder, single episode, unspecified 01/31/2011   Memory disorder 04/25/2014   Mitral valve disorders(424.0) 01/31/2011   Other and unspecified hyperlipidemia 01/31/2011   Other convulsions 01/31/2011   Other emphysema ([place]) 01/31/2011   Pain in joint, site unspecified 01/31/2011   Restless legs syndrome (RLS) 09/12/2011   Retinal detachment with retinal defect of right eye 2011   right eye twice   Seizure disorder ([place])    Senile osteoporosis 01/31/2011   Spontaneous ecchymoses 01/31/2011   Stiffness of joints, not elsewhere classified, multiple sites 01/31/2011   Unspecified essential hypertension 01/31/2011   Past Surgical History:  Procedure Laterality Date   ABDOMINAL HYSTERECTOMY  06/21/2003   TAH/BSO, omenectomy PSB resect, Stg IC cystadenofibroma   CHOLECYSTECTOMY  2005   Dr. Marlou Starks  ELBOW SURGERY Right 2008   broken   Dr. Apolonio Schneiders   EYE SURGERY     INTRAMEDULLARY (IM) NAIL INTERTROCHANTERIC Left 08/30/2021   Procedure: INTRAMEDULLARY (IM) NAIL INTERTROCHANTRIC;  Surgeon: Rod Can, MD;  Location: [facility];  Service: Orthopedics;  Laterality: Left;   ORIF  PATELLA Left 05/02/2020   Procedure: OPEN REDUCTION INTERNAL (ORIF) FIXATION LEFT PATELLA WITH MEDIAL AND LATERAL LIGAMENT REINFORCEMENTS;  Surgeon: [name], MD;  Location: [facility];  Service: Orthopedics;  Laterality: Left;   ORIF PATELLA Left 05/30/2020   Procedure: OPEN REDUCTION INTERNAL (ORIF) FIXATION PATELLA;  Surgeon: [name], MD;  Location: [facility];  Service: Orthopedics;  Laterality: Left;   RETINAL DETACHMENT SURGERY N/A    two   REVERSE SHOULDER ARTHROPLASTY Left 05/06/2019   Procedure: REVERSE SHOULDER ARTHROPLASTY;  Surgeon: Justice Britain, MD;  Location: [facility];  Service: Orthopedics;  Laterality: Left;  111mn   ROTATOR CUFF REPAIR Right 2012   Dr. CTheda Sers  SQUAMOUS CELL CARCINOMA EXCISION Bilateral 2012, 8/14   [place] on legs   Dr. GSarajane Jews  TONSILLECTOMY  1941   VIDEO BRONCHOSCOPY WITH ENDOBRONCHIAL NAVIGATION N/A 11/29/2015   Procedure: VIDEO BRONCHOSCOPY WITH ENDOBRONCHIAL NAVIGATION;  Surgeon: RCollene Gobble MD;  [place] [facility]  Service: Thoracic;  Laterality: N/A;    Allergies  Allergen Reactions   Vioxx [Rofecoxib] Shortness Of Breath   Dyazide [Hydrochlorothiazide W-Triamterene] Other (See Comments)    Lowers blood pressure too much   Latex Other (See Comments)    Swelling   Sulfa Antibiotics Nausea And Vomiting   Sumycin [Tetracycline]     Can't take due to drug reaction    Outpatient Encounter Medications as of 09/10/2021  Medication Sig   acetaminophen (TYLENOL) 500 MG tablet Take 1,000 mg by mouth every 6 (six) hours as needed (pain).   Albuterol Sulfate (PROAIR RESPICLICK) 1283(90 Base) MCG/ACT AEPB Inhale 2 puffs into the lungs daily as needed. Every AM PRN. Patient can keep it with her in room   aspirin (ASPIRIN CHILDRENS) 81 MG chewable tablet Chew 1 tablet (81 mg total) by mouth 2 (two) times daily with a meal.   cholecalciferol (VITAMIN D) 1000 units tablet Take 1 tablet (1,000 Units total) by mouth daily.   HYDROcodone-acetaminophen  (NORCO) 10-325 MG tablet Take 0.5 tablets by mouth every 4 (four) hours as needed for up to 3 days for moderate pain or severe pain.   hydrocortisone 2.5 % lotion thin layer; topical,Twice A Day - PRN Special Instructions: apply to face and ears [DX: Psoriasis, unspecified]   hydrocortisone cream 1 % Apply 1 application. topically 2 (two) times daily as needed (psoriasis).   ketoconazole (NIZORAL) 2 % cream Apply 1 application. topically daily as needed.   ketoconazole (NIZORAL) 2 % shampoo Apply 1 application. topically See admin instructions. Monday, Thursday   lamoTRIgine (LAMICTAL) 150 MG tablet Take 150 mg by mouth 2 (two) times daily.   levETIRAcetam (KEPPRA) 250 MG tablet Take 250 mg by mouth daily. In the morning   levETIRAcetam (KEPPRA) 500 MG tablet Take 500 mg by mouth at bedtime.   memantine (NAMENDA) 10 MG tablet TAKE 1 TABLET BY MOUTH TWICE DAILY.   nystatin (MYCOSTATIN/NYSTOP) powder Apply 1 application. topically 2 (two) times daily as needed (Apply to urogenital area).   OxyCODONE HCl, Abuse Deter, (OXAYDO) 5 MG TABA Take 5 mg by mouth every 6 (six) hours as needed.  Nondisplaced transverse fracture of left patella, subsequent encounter for closed fracture  with routine healing   pantoprazole (PROTONIX) 40 MG tablet Take 40 mg by mouth daily.   Potassium Chloride ER 20 MEQ TBCR Take 20 mEq by mouth daily.   pramipexole (MIRAPEX) 0.25 MG tablet Take 0.5 mg by mouth at bedtime.   sennosides-docusate sodium (SENOKOT-S) 8.6-50 MG tablet Take 2 tablets by mouth at bedtime.   simvastatin (ZOCOR) 10 MG tablet Take 10 mg by mouth at bedtime.   tiotropium (SPIRIVA HANDIHALER) 18 MCG inhalation capsule INHALE CONTENTS OF ONE CAPSULE ONCE DAILY FOR COPD.   triamcinolone (KENALOG) 0.1 % Apply 1 application. topically daily as needed (psoriasis).   loperamide (IMODIUM A-D) 2 MG tablet Take 4 mg by mouth See admin instructions. 4 mg for 1st dose, then 2 mg after each loose stool for 48 hours.  Not to exceed 16 mg in 24 hours.   [DISCONTINUED] hydrocortisone 2.5 % cream Apply 1 application. topically 2 (two) times daily as needed (psoriasis). (Patient not taking: Reported on 08/29/2021)   No facility-administered encounter medications on file as of 09/10/2021.    Review of Systems  Constitutional:  Negative for appetite change, chills, fatigue and fever.  HENT:  Negative for congestion, hearing loss, rhinorrhea and sore throat.   Eyes: Negative.   Respiratory:  Negative for cough, shortness of breath and wheezing.   Cardiovascular:  Negative for chest pain, palpitations and leg swelling.  Gastrointestinal:  Negative for abdominal pain, constipation, diarrhea, nausea and vomiting.  Genitourinary:  Negative for dysuria.  Musculoskeletal:  Negative for arthralgias, back pain and myalgias.  Skin:  Negative for color change, rash and wound.  Neurological:  Negative for dizziness, weakness and headaches.  Psychiatric/Behavioral:  Negative for behavioral problems. The patient is not nervous/anxious.       Immunization History  Administered Date(s) Administered   Influenza Split 01/06/2014, 01/15/2017, 01/08/2018, 12/09/2018   Influenza Whole 01/07/2012, 01/06/2013   Influenza, High Dose Seasonal PF 12/25/2015, 01/20/2017   Influenza,inj,Quad PF,6+ Mos 12/21/2014   Influenza-Unspecified 12/09/2018, 01/18/2020, 01/24/2021   Moderna SARS-COV2 Booster Vaccination 04/02/2021   Moderna Sars-Covid-2 Vaccination 04/12/2019, 06/05/2019, 02/15/2020, 09/05/2020   PFIZER(Purple Top)SARS-COV-2 Vaccination 12/26/2020   Pneumococcal Conjugate-13 02/01/2014   Pneumococcal Polysaccharide-23 12/20/1992, 01/15/2000, 04/08/2004, 06/08/2004   Td 04/08/2002, 04/22/2002   Tdap 04/09/2011, 08/17/2015   Zoster Recombinat (Shingrix) 05/29/2005, 08/27/2017   Zoster, Live 04/08/2008, 05/29/2014, 05/28/2017   Zoster, Unspecified 05/29/2005   Pertinent  Health Maintenance Due  Topic Date Due   INFLUENZA  VACCINE  11/06/2021   DEXA SCAN  Completed   MAMMOGRAM  Discontinued      09/06/2021    8:00 AM 09/06/2021    7:45 PM 09/07/2021    9:00 AM 09/07/2021    9:30 PM 09/08/2021   12:40 PM  Fall Risk  Patient Fall Risk Level High fall risk High fall risk High fall risk High fall risk High fall risk     Vitals:   09/10/21 1328  BP: 133/74  Pulse: 82  Resp: 18  Temp: (!) 97.3 F (36.3 C)  SpO2: 95%  Weight: 163 lb 11.2 oz (74.3 kg)  Height: '5\' 2"'$  (1.575 m)   Body mass index is 29.94 kg/m.  Physical Exam Constitutional:      General: She is not in acute distress.    Appearance: She is obese.  HENT:     Head: Normocephalic and atraumatic.     Nose: Nose normal.     Mouth/Throat:     Mouth: Mucous membranes are moist.  Eyes:  Conjunctiva/sclera: Conjunctivae normal.  Cardiovascular:     Rate and Rhythm: Normal rate and regular rhythm.  Pulmonary:     Effort: Pulmonary effort is normal.     Breath sounds: Normal breath sounds.  Abdominal:     General: Bowel sounds are normal.     Palpations: Abdomen is soft.  Musculoskeletal:        General: Normal range of motion.     Cervical back: Normal range of motion.  Skin:    General: Skin is warm and dry.     Comments: Left hip surgical wound with sutures, covered with mepilex dressing, dry and no erythema.  Neurological:     General: No focal deficit present.     Mental Status: She is alert and oriented to person, place, and time.  Psychiatric:        Mood and Affect: Mood normal.        Behavior: Behavior normal.        Thought Content: Thought content normal.        Judgment: Judgment normal.       Labs reviewed: Recent Labs    09/01/21 0307 09/02/21 0359 09/04/21 1308 09/06/21 0449  NA 139 138 144  --   K 3.3* 3.2* 3.9  --   CL 106 104 108  --   CO2 '28 27 26  '$ --   GLUCOSE 99 98 102*  --   BUN 23 21 31*  --   CREATININE 0.83 0.78 0.88 0.78  CALCIUM 8.7* 8.5* 8.8*  --   MG  --  2.2  --   --    Recent Labs     05/22/21 0000  AST 16  ALT 12  ALBUMIN 4.1   Recent Labs    05/22/21 0000 08/29/21 0930 08/30/21 0323 09/01/21 0307 09/02/21 0359 09/04/21 1308  WBC 3.9 3.9*   < > 6.5 7.8 7.7  NEUTROABS 1,962.00 2.3  --   --   --   --   HGB 11.5* 12.6   < > 9.4* 9.4* 10.0*  HCT 36 39.9   < > 31.1* 30.6* 32.5*  MCV  --  84.2   < > 86.1 85.2 86.4  PLT 220 218   < > 163 190 239   < > = values in this interval not displayed.   Lab Results  Component Value Date   TSH 12.00 (A) 05/22/2021   No results found for: HGBA1C Lab Results  Component Value Date   CHOL 169 11/23/2020   HDL 64 11/23/2020   LDLCALC 88 11/23/2020   TRIG 80 11/23/2020   CHOLHDL 3.6 06/27/2015    Significant Diagnostic Results in last 30 days:  DG Chest 1 View  Result Date: 08/29/2021 CLINICAL DATA:  fall, pain EXAM: CHEST  1 VIEW COMPARISON:  05/10/2019 FINDINGS: Chronically elevated left hemidiaphragm with left basilar atelectasis. Cardiomediastinal silhouette is unchanged. No large pleural effusion. No visible pneumothorax. Left reverse total shoulder arthroplasty. Right glenohumeral osteoarthritis with appearance of a sclerotic humeral head, possibly reflecting AVN. Possible anterior left lower rib fractures involving ribs 8 and 9. Dextroconvex lower thoracic/upper lumbar curvature. IMPRESSION: Possible nondisplaced fractures involving the anterior left 8 and ninth ribs. Chronically elevated left hemidiaphragm with left basilar subsegmental atelectasis. Sclerotic appearance of the right humeral head possibly reflecting AVN. Electronically Signed   By: Maurine Simmering M.D.   On: 08/29/2021 09:34   Pelvis Portable  Result Date: 08/30/2021 CLINICAL DATA:  Status post left femur ORIF EXAM:  PORTABLE PELVIS 1-2 VIEWS COMPARISON:  08/29/2021 FINDINGS: Status post gamma nail fixation of intertrochanteric/basicervical femur fracture. Components appear well positioned. Lesser trochanter is displaced relative to the preoperative  imaging. IMPRESSION: Status post gamma nail fixation. Good position and alignment, with exception that the lesser trochanter is now displaced from its original position. Electronically Signed   By: Nelson Chimes M.D.   On: 08/30/2021 17:52   CT HIP LEFT WO CONTRAST  Addendum Date: 08/29/2021   ADDENDUM REPORT: 08/29/2021 14:44 ADDENDUM: Incidental note of an enlarged left inguinal lymph node measuring up to 3.6 by 1.7 by 4.3 cm. This is nonspecific and may be reactive, however cannot exclude malignant involvement. This is increased from 2.4 by 0.9 cm on remote 11/08/2015 PET-CT. Recommend clinical correlation. Electronically Signed   By: Yvonne Kendall M.D.   On: 08/29/2021 14:44   Result Date: 08/29/2021 CLINICAL DATA:  Unwitnessed fall. Hip surgical planning. Left hip pain and shortening and rotation. CT planning for surgery. EXAM: CT OF THE LEFT HIP WITHOUT CONTRAST TECHNIQUE: Multidetector CT imaging of the left hip was performed according to the standard protocol. Multiplanar CT image reconstructions were also generated. RADIATION DOSE REDUCTION: This exam was performed according to the departmental dose-optimization program which includes automated exposure control, adjustment of the mA and/or kV according to patient size and/or use of iterative reconstruction technique. COMPARISON:  Left hip radiographs 08/29/2021; CT abdomen and pelvis 10/20/2015 FINDINGS: Bones/Joint/Cartilage Mild pubic symphysis joint space narrowing and peripheral degenerative spurring. No cortical erosion or suspicious bone marrow lesion is seen, with attention to the superomedial aspect of the right pubic body questioned on radiograph. The relative lucency may be due to overlying bowel gas within the rectum on that radiograph. There is a comminuted acute fracture of the distal femoral neck and intertrochanteric proximal left femur. There is mild posterior angulation of the intervening fracture fragments (axial series 2, image 47).  External rotation of the greater trochanter region of the proximal femur. Multiple fracture lines extend through the greater trochanter. Mild-to-moderate superior impaction of the distal fracture component. Mild-to-moderate varus angulation. Nondisplaced fracture line extends through the anterior aspect of the lesser trochanter (coronal series 6, image 48). Mild-to-moderate left femoroacetabular joint space narrowing and peripheral osteophytosis. Partial visualization of old healed right inferior pubic ramus fracture. Ligaments Suboptimally assessed by CT. Muscles and Tendons Normal density and size of the regional musculature. Soft tissues Mild diverticulosis at the junction of the distal descending colon and proximal sigmoid colon. There is a wide-mouth partially visualized midline ventral hernia of the pelvis seen diffusely. At the inferior aspect, there is a component that is smaller in transverse dimension and contains mesenteric fat and nondistended loops of small bowel, measuring up to 4.8 x 4.1 cm (transverse by AP, as measured on axial series 2, image 50). There also appears to be a wide mouth hernia lateral to the rectus abdominis musculature within the anterior left pelvis containing mesenteric fat and nondistended loops of small bowel. The uterus appears to be surgically absent. IMPRESSION: 1. Acute, comminuted, mildly displaced, mild to moderately impacted, and mildly varus angulated proximal left femoral fracture of the junction of the distal femoral neck and intertrochanteric region. 2. Mild pubic symphysis osteoarthritis. No cortical erosion or suspicious bone marrow lesion is seen. 3. Old healed right inferior pubic ramus fracture. 4. Midline and left paramidline ventral abdominal and pelvic hernias containing mesenteric fat and nondistended loops of small bowel. Electronically Signed: By: Yvonne Kendall M.D. On: 08/29/2021 11:43  DG Knee Left Port  Result Date: 08/29/2021 CLINICAL DATA:  Acute  left hip fracture.  Pain. EXAM: PORTABLE LEFT KNEE - 1-2 VIEW COMPARISON:  Left knee radiographs 04/26/2020 FINDINGS: There are 2 new screws from inferior approach fixating the previously seen transverse fracture of the mid height of the patella. There is now normal alignment of the fracture components with resolution of the prior diastasis. Moderate patellofemoral joint space narrowing and peripheral osteophytosis. Mild-to-moderate medial and lateral compartment joint space narrowing and peripheral osteophytosis. No acute fracture or dislocation. No joint effusion. Resolution of the prior prepatellar soft tissue swelling/hematoma. IMPRESSION: Interval ORIF of the prior transverse fracture of the midpole of the patella with resolution of the prior bone distraction. Electronically Signed   By: Yvonne Kendall M.D.   On: 08/29/2021 11:45   DG C-Arm 1-60 Min-No Report  Result Date: 08/30/2021 Fluoroscopy was utilized by the requesting physician.  No radiographic interpretation.   DG HIP UNILAT WITH PELVIS 1V LEFT  Result Date: 08/30/2021 CLINICAL DATA:  Left IM nail EXAM: DG HIP (WITH OR WITHOUT PELVIS) 1V*L*; DG C-ARM 1-60 MIN-NO REPORT COMPARISON:  CT left hip dated 08/29/2021 Fluoroscopy time: 1 minute 8 seconds 16.98 mGy FINDINGS: IM nail with dynamic hip screw fixation of an intertrochanteric left hip fracture. Fracture fragments are in near anatomic alignment and position. IMPRESSION: Status post ORIF of a left hip fracture, as above. Electronically Signed   By: Julian Hy M.D.   On: 08/30/2021 20:09   DG Hip Unilat With Pelvis 2-3 Views Left  Result Date: 08/29/2021 CLINICAL DATA:  fall, pain EXAM: DG HIP (WITH OR WITHOUT PELVIS) 2-3V LEFT COMPARISON:  CT 10/20/2015 FINDINGS: There is an acute, mildly displaced left-sided intertrochanteric fracture. There are chronic right superior and inferior pubic rami fractures. There is an indistinct appearance of the superior cortex of the right pubic body  with adjacent osseous lucency. Mild, left greater than right hip osteoarthritis. Mild degenerative changes of the sacroiliac joints. IMPRESSION: Acute, mildly displaced left intertrochanteric femur fracture. Possible lucent lesion within the right superior pubic body, recommend pelvis CT. Chronic right superior and inferior pubic rami fractures. Electronically Signed   By: Maurine Simmering M.D.   On: 08/29/2021 09:29    Assessment/Plan  1. Closed fracture of left hip, sequela -  Orthopedic surgery was consulted and performed intramedullary fixation on 08/30/2021. -   Continue aspirin 81 mg 1 tab twice a day for DVT prophylaxis -    will continue Oxy IR 5 mg 1 tab every 6 hours as needed and discontinue Norco for pain -   Follow-up with orthopedics in 2 weeks -   WBAT on LLE -    For PT and OT, for therapeutic strengthening exercises  2. Seizure disorder ([place]) -   Continue Keppra  3. Restless legs syndrome (RLS) -   Continue Mirapex and Lamictal  4. Panlobular emphysema ([facility]) -    No wheezing/SOB, continue Spiriva  5. Essential hypertension -   Amlodipine was held in the hospital and BP was stable  6. Gastroesophageal reflux disease, unspecified whether esophagitis present -    Continue Pantoprazole  7. Senile dementia ([place]) -   Continue memantine -   continue supportive care   Family/ staff Communication: Discussed plan of care with resident and charge nurse.    Labs/tests ordered:   CBC with differentials and BMP with GFR    Durenda Age, DNP, MSN, FNP-BC [place] [phone] (Monday-Friday 8:00 a.m. -  5:00 p.m.) [phone] (after hours)

## 2021-09-11 ENCOUNTER — Encounter: Payer: Self-pay | Admitting: Internal Medicine

## 2021-09-11 ENCOUNTER — Non-Acute Institutional Stay (SKILLED_NURSING_FACILITY): Payer: Medicare PPO | Admitting: Internal Medicine

## 2021-09-11 ENCOUNTER — Telehealth: Payer: Self-pay

## 2021-09-11 DIAGNOSIS — S72002A Fracture of unspecified part of neck of left femur, initial encounter for closed fracture: Secondary | ICD-10-CM | POA: Diagnosis not present

## 2021-09-11 DIAGNOSIS — I10 Essential (primary) hypertension: Secondary | ICD-10-CM

## 2021-09-11 DIAGNOSIS — E782 Mixed hyperlipidemia: Secondary | ICD-10-CM | POA: Diagnosis not present

## 2021-09-11 DIAGNOSIS — F0390 Unspecified dementia without behavioral disturbance: Secondary | ICD-10-CM | POA: Diagnosis not present

## 2021-09-11 DIAGNOSIS — K219 Gastro-esophageal reflux disease without esophagitis: Secondary | ICD-10-CM | POA: Diagnosis not present

## 2021-09-11 DIAGNOSIS — R600 Localized edema: Secondary | ICD-10-CM | POA: Diagnosis not present

## 2021-09-11 DIAGNOSIS — G40909 Epilepsy, unspecified, not intractable, without status epilepticus: Secondary | ICD-10-CM | POA: Diagnosis not present

## 2021-09-11 DIAGNOSIS — M810 Age-related osteoporosis without current pathological fracture: Secondary | ICD-10-CM

## 2021-09-11 DIAGNOSIS — J431 Panlobular emphysema: Secondary | ICD-10-CM | POA: Diagnosis not present

## 2021-09-11 DIAGNOSIS — G2581 Restless legs syndrome: Secondary | ICD-10-CM

## 2021-09-11 DIAGNOSIS — S72002S Fracture of unspecified part of neck of left femur, sequela: Secondary | ICD-10-CM

## 2021-09-11 LAB — CBC AND DIFFERENTIAL
HCT: 32 — AB (ref 36–46)
Hemoglobin: 10 — AB (ref 12.0–16.0)
Neutrophils Absolute: 4416
Platelets: 422 10*3/uL — AB (ref 150–400)
WBC: 6.3

## 2021-09-11 LAB — BASIC METABOLIC PANEL
BUN: 23 — AB (ref 4–21)
CO2: 26 — AB (ref 13–22)
Chloride: 109 — AB (ref 99–108)
Creatinine: 0.6 (ref 0.5–1.1)
Glucose: 80
Potassium: 4.3 mEq/L (ref 3.5–5.1)
Sodium: 142 (ref 137–147)

## 2021-09-11 LAB — CBC: RBC: 3.73 — AB (ref 3.87–5.11)

## 2021-09-11 LAB — COMPREHENSIVE METABOLIC PANEL: Calcium: 8.7 (ref 8.7–10.7)

## 2021-09-11 NOTE — Progress Notes (Signed)
Location:   [place] Room Number: 29 Place of Service:  SNF [phone]) Provider:  Veleta Miners MD  [name], MD  Patient Care Team: [name], MD as PCP - General (Internal Medicine) [name], MD as Consulting Physician (Ophthalmology) [name], MD as Consulting Physician (Cardiology) [name], MD as Consulting Physician (Dermatology) [name], MD as Consulting Physician (Orthopedic Surgery) [name], MD as Consulting Physician (Orthopedic Surgery) [name], MD as Consulting Physician (Orthopedic Surgery) [place], [facility], [place], NP as Nurse Practitioner (Nurse Practitioner) [name], MD (Inactive) as Consulting Physician (Neurology)  Extended Emergency Contact Information Primary Emergency Contact: [name],[name] Address: [address]          [place], [place] [address] [name] of [place] Phone: [phone] Relation: Sister Secondary Emergency Contact: [name] [place] of [place] Phone: [phone] Mobile Phone: [phone] Relation: Sister  Code Status:  DNR Goals of care: Advanced Directive information    09/10/2021    1:31 PM  Advanced Directives  Does Patient Have a Medical Advance Directive? Yes  Type of Advance Directive Out of facility DNR (pink MOST or yellow form)  Does patient want to make changes to medical advance directive? No - Patient declined  Pre-existing out of facility DNR order (yellow form or pink MOST form) Pink MOST/Yellow Form most recent copy in chart - Physician notified to receive inpatient order     Chief Complaint  Patient presents with   Acute Visit    HPI:  Pt is a 88 y.o. female seen today for an acute visit for Readmit to [facility]F  Admitted in the hospital from 5/24-6/03 for Left Hip Fracture Underwent Intramedullary fixation   Patient lives in SNF in [place]  Patient has h/o COPD,  Hypertension,Seizure Disorder,Mild Cognitive impairment, Hyperlipidemia Chronic Venous Changes in LE  Left Humerus Fracture with Displacement Sustained Left Patellar Fracture due to Mechanical fall  H/o Thyroid Nodules  Patient had Mechanical Fall in her room No Dizziness or LOC  Was send to ED Was found to have Left Hip Fracture Underwent IM Fixation Aspirin for DVT prophylaxis WBAT Doing well Pain seems controlled  No Other issues      Past Medical History:  Diagnosis Date   Acute bronchitis 05/23/2011   Acute upper respiratory infections of unspecified site 05/23/2011   Arthritis    Chronic airway obstruction, not elsewhere classified 05/23/2011   Disturbance of salivary secretion 01/31/2011   Dizziness and giddiness 01/31/2011   Dyspnea    Essential tremor 04/25/2014   External hemorrhoids without mention of complication 35/57/3220   Gait disorder 04/25/2014   Insomnia, unspecified 09/12/2011   Lumbago 01/31/2011   Major depressive disorder, single episode, unspecified 01/31/2011   Memory disorder 04/25/2014   Mitral valve disorders(424.0) 01/31/2011   Other and unspecified hyperlipidemia 01/31/2011   Other convulsions 01/31/2011   Other emphysema ([place]) 01/31/2011   Pain in joint, site unspecified 01/31/2011   Restless legs syndrome (RLS) 09/12/2011   Retinal detachment with retinal defect of right eye 2011   right eye twice   Seizure disorder ([place])    Senile osteoporosis 01/31/2011   Spontaneous ecchymoses 01/31/2011   Stiffness of joints, not elsewhere classified, multiple sites 01/31/2011   Unspecified essential hypertension 01/31/2011   Past Surgical History:  Procedure Laterality Date   ABDOMINAL HYSTERECTOMY  06/21/2003   TAH/BSO, omenectomy PSB resect,  Stg IC cystadenofibroma   CHOLECYSTECTOMY  2005   Dr. Marlou Starks   ELBOW SURGERY Right 2008   broken   Dr. Apolonio Schneiders   EYE SURGERY     INTRAMEDULLARY (IM) NAIL INTERTROCHANTERIC Left 08/30/2021   Procedure:  INTRAMEDULLARY (IM) NAIL INTERTROCHANTRIC;  Surgeon: Rod Can, MD;  Location: [facility];  Service: Orthopedics;  Laterality: Left;   ORIF PATELLA Left 05/02/2020   Procedure: OPEN REDUCTION INTERNAL (ORIF) FIXATION LEFT PATELLA WITH MEDIAL AND LATERAL LIGAMENT REINFORCEMENTS;  Surgeon: [name], MD;  Location: [facility];  Service: Orthopedics;  Laterality: Left;   ORIF PATELLA Left 05/30/2020   Procedure: OPEN REDUCTION INTERNAL (ORIF) FIXATION PATELLA;  Surgeon: [name], MD;  Location: [facility];  Service: Orthopedics;  Laterality: Left;   RETINAL DETACHMENT SURGERY N/A    two   REVERSE SHOULDER ARTHROPLASTY Left 05/06/2019   Procedure: REVERSE SHOULDER ARTHROPLASTY;  Surgeon: Justice Britain, MD;  Location: [facility];  Service: Orthopedics;  Laterality: Left;  125mn   ROTATOR CUFF REPAIR Right 2012   Dr. CTheda Sers  SQUAMOUS CELL CARCINOMA EXCISION Bilateral 2012, 8/14   [place] on legs   Dr. GSarajane Jews  TONSILLECTOMY  1941   VIDEO BRONCHOSCOPY WITH ENDOBRONCHIAL NAVIGATION N/A 11/29/2015   Procedure: VIDEO BRONCHOSCOPY WITH ENDOBRONCHIAL NAVIGATION;  Surgeon: RCollene Gobble MD;  [facility];  Service: Thoracic;  Laterality: N/A;    Allergies  Allergen Reactions   Vioxx [Rofecoxib] Shortness Of Breath   Dyazide [Hydrochlorothiazide W-Triamterene] Other (See Comments)    Lowers blood pressure too much   Latex Other (See Comments)    Swelling   Sulfa Antibiotics Nausea And Vomiting   Sumycin [Tetracycline]     Can't take due to drug reaction    Allergies as of 09/11/2021       Reactions   Vioxx [rofecoxib] Shortness Of Breath   Dyazide [hydrochlorothiazide W-triamterene] Other (See Comments)   Lowers blood pressure too much   Latex Other (See Comments)   Swelling   Sulfa Antibiotics Nausea And Vomiting   Sumycin [tetracycline]    Can't take due to drug reaction        Medication List        Accurate as of September 11, 2021  4:02 PM. If you have any questions, ask  your nurse or doctor.          STOP taking these medications    HYDROcodone-acetaminophen 10-325 MG tablet Commonly known as: Norco Stopped by: AVirgie Dad MD   loperamide 2 MG tablet Commonly known as: IMODIUM A-D Stopped by: AVirgie Dad MD       TAKE these medications    acetaminophen 500 MG tablet Commonly known as: TYLENOL Take 1,000 mg by mouth every 6 (six) hours as needed (pain).   aspirin 81 MG chewable tablet Commonly known as: Aspirin Childrens Chew 1 tablet (81 mg total) by mouth 2 (two) times daily with a meal.   cholecalciferol 1000 units tablet Commonly known as: VITAMIN D Take 1 tablet (1,000 Units total) by mouth daily.   hydrocortisone cream 1 % Apply 1 application. topically 2 (two) times daily as needed (psoriasis).   hydrocortisone 2.5 % lotion thin layer; topical,Twice A Day - PRN Special Instructions: apply to face and ears [DX: Psoriasis, unspecified]   ketoconazole 2 % shampoo Commonly known as: NIZORAL Apply 1 application. topically See admin instructions. Monday, Thursday   ketoconazole 2 % cream Commonly known as: NIZORAL Apply 1 application. topically daily  as needed.   lamoTRIgine 150 MG tablet Commonly known as: LAMICTAL Take 150 mg by mouth 2 (two) times daily.   levETIRAcetam 500 MG tablet Commonly known as: KEPPRA Take 500 mg by mouth at bedtime.   levETIRAcetam 250 MG tablet Commonly known as: KEPPRA Take 250 mg by mouth daily. In the morning   memantine 10 MG tablet Commonly known as: NAMENDA TAKE 1 TABLET BY MOUTH TWICE DAILY.   nystatin powder Commonly known as: MYCOSTATIN/NYSTOP Apply 1 application. topically 2 (two) times daily as needed (Apply to urogenital area).   OxyCODONE HCl (Abuse Deter) 5 MG Taba Commonly known as: OXAYDO Take 5 mg by mouth every 6 (six) hours as needed.  Nondisplaced transverse fracture of left patella, subsequent encounter for closed fracture with routine healing    pantoprazole 40 MG tablet Commonly known as: PROTONIX Take 40 mg by mouth daily.   Potassium Chloride ER 20 MEQ Tbcr Take 20 mEq by mouth daily.   pramipexole 0.25 MG tablet Commonly known as: MIRAPEX Take 0.5 mg by mouth at bedtime.   ProAir RespiClick 793 (90 Base) MCG/ACT Aepb Generic drug: Albuterol Sulfate Inhale 2 puffs into the lungs daily as needed. Every AM PRN. Patient can keep it with her in room   sennosides-docusate sodium 8.6-50 MG tablet Commonly known as: SENOKOT-S Take 2 tablets by mouth at bedtime.   simvastatin 10 MG tablet Commonly known as: ZOCOR Take 10 mg by mouth at bedtime.   tiotropium 18 MCG inhalation capsule Commonly known as: Spiriva HandiHaler INHALE CONTENTS OF ONE CAPSULE ONCE DAILY FOR COPD.   triamcinolone cream 0.1 % Commonly known as: KENALOG Apply 1 application. topically daily as needed (psoriasis).        Review of Systems  Constitutional:  Positive for activity change. Negative for appetite change.  HENT: Negative.    Respiratory:  Negative for cough and shortness of breath.   Cardiovascular:  Positive for leg swelling.  Gastrointestinal:  Negative for constipation.  Genitourinary: Negative.   Musculoskeletal:  Positive for gait problem. Negative for arthralgias and myalgias.  Skin: Negative.   Neurological:  Negative for dizziness and weakness.  Psychiatric/Behavioral:  Negative for confusion, dysphoric mood and sleep disturbance.     Immunization History  Administered Date(s) Administered   Influenza Split 01/06/2014, 01/15/2017, 01/08/2018, 12/09/2018   Influenza Whole 01/07/2012, 01/06/2013   Influenza, High Dose Seasonal PF 12/25/2015, 01/20/2017   Influenza,inj,Quad PF,6+ Mos 12/21/2014   Influenza-Unspecified 12/09/2018, 01/18/2020, 01/24/2021   Moderna SARS-COV2 Booster Vaccination 04/02/2021   Moderna Sars-Covid-2 Vaccination 04/12/2019, 06/05/2019, 02/15/2020, 09/05/2020   PFIZER(Purple Top)SARS-COV-2  Vaccination 12/26/2020   Pneumococcal Conjugate-13 02/01/2014   Pneumococcal Polysaccharide-23 12/20/1992, 01/15/2000, 04/08/2004, 06/08/2004   Td 04/08/2002, 04/22/2002   Tdap 04/09/2011, 08/17/2015   Zoster Recombinat (Shingrix) 05/29/2005, 08/27/2017   Zoster, Live 04/08/2008, 05/29/2014, 05/28/2017   Zoster, Unspecified 05/29/2005   Pertinent  Health Maintenance Due  Topic Date Due   INFLUENZA VACCINE  11/06/2021   DEXA SCAN  Completed   MAMMOGRAM  Discontinued      09/06/2021    8:00 AM 09/06/2021    7:45 PM 09/07/2021    9:00 AM 09/07/2021    9:30 PM 09/08/2021   12:40 PM  Fall Risk  Patient Fall Risk Level High fall risk High fall risk High fall risk High fall risk High fall risk   Functional Status Survey:    Vitals:   09/11/21 1556  BP: 121/79  Pulse: 90  Resp: 18  Temp: (!) 96.3  F (35.7 C)  SpO2: 92%  Weight: 163 lb 11.2 oz (74.3 kg)  Height: '5\' 2"'$  (1.575 m)   Body mass index is 29.94 kg/m. Physical Exam Vitals reviewed.  Constitutional:      Appearance: Normal appearance.  HENT:     Head: Normocephalic.     Nose: Nose normal.     Mouth/Throat:     Mouth: Mucous membranes are moist.     Pharynx: Oropharynx is clear.  Eyes:     Pupils: Pupils are equal, round, and reactive to light.  Cardiovascular:     Rate and Rhythm: Normal rate and regular rhythm.     Pulses: Normal pulses.     Heart sounds: Normal heart sounds. No murmur heard. Pulmonary:     Effort: Pulmonary effort is normal.     Breath sounds: Normal breath sounds.  Abdominal:     General: Abdomen is flat. Bowel sounds are normal.     Palpations: Abdomen is soft.  Musculoskeletal:        General: Swelling present.     Cervical back: Neck supple.  Skin:    General: Skin is warm.  Neurological:     General: No focal deficit present.     Mental Status: She is alert and oriented to person, place, and time.  Psychiatric:        Mood and Affect: Mood normal.        Thought Content: Thought  content normal.     Labs reviewed: Recent Labs    09/01/21 0307 09/02/21 0359 09/04/21 1308 09/06/21 0449 09/11/21 0000  NA 139 138 144  --  142  K 3.3* 3.2* 3.9  --  4.3  CL 106 104 108  --  109*  CO2 '28 27 26  '$ --  26*  GLUCOSE 99 98 102*  --   --   BUN 23 21 31*  --  23*  CREATININE 0.83 0.78 0.88 0.78 0.6  CALCIUM 8.7* 8.5* 8.8*  --  8.7  MG  --  2.2  --   --   --    Recent Labs    05/22/21 0000  AST 16  ALT 12  ALBUMIN 4.1   Recent Labs    05/22/21 0000 08/29/21 0930 08/30/21 0323 09/01/21 0307 09/02/21 0359 09/04/21 1308 09/11/21 0000  WBC 3.9 3.9*   < > 6.5 7.8 7.7 6.3  NEUTROABS 1,962.00 2.3  --   --   --   --  4,416.00  HGB 11.5* 12.6   < > 9.4* 9.4* 10.0* 10.0*  HCT 36 39.9   < > 31.1* 30.6* 32.5* 32*  MCV  --  84.2   < > 86.1 85.2 86.4  --   PLT 220 218   < > 163 190 239 422*   < > = values in this interval not displayed.   Lab Results  Component Value Date   TSH 12.00 (A) 05/22/2021   No results found for: HGBA1C Lab Results  Component Value Date   CHOL 169 11/23/2020   HDL 64 11/23/2020   LDLCALC 88 11/23/2020   TRIG 80 11/23/2020   CHOLHDL 3.6 06/27/2015    Significant Diagnostic Results in last 30 days:  DG Chest 1 View  Result Date: 08/29/2021 CLINICAL DATA:  fall, pain EXAM: CHEST  1 VIEW COMPARISON:  05/10/2019 FINDINGS: Chronically elevated left hemidiaphragm with left basilar atelectasis. Cardiomediastinal silhouette is unchanged. No large pleural effusion. No visible pneumothorax. Left reverse total shoulder arthroplasty. Right  glenohumeral osteoarthritis with appearance of a sclerotic humeral head, possibly reflecting AVN. Possible anterior left lower rib fractures involving ribs 8 and 9. Dextroconvex lower thoracic/upper lumbar curvature. IMPRESSION: Possible nondisplaced fractures involving the anterior left 8 and ninth ribs. Chronically elevated left hemidiaphragm with left basilar subsegmental atelectasis. Sclerotic appearance of  the right humeral head possibly reflecting AVN. Electronically Signed   By: Maurine Simmering M.D.   On: 08/29/2021 09:34   Pelvis Portable  Result Date: 08/30/2021 CLINICAL DATA:  Status post left femur ORIF EXAM: PORTABLE PELVIS 1-2 VIEWS COMPARISON:  08/29/2021 FINDINGS: Status post gamma nail fixation of intertrochanteric/basicervical femur fracture. Components appear well positioned. Lesser trochanter is displaced relative to the preoperative imaging. IMPRESSION: Status post gamma nail fixation. Good position and alignment, with exception that the lesser trochanter is now displaced from its original position. Electronically Signed   By: Nelson Chimes M.D.   On: 08/30/2021 17:52   CT HIP LEFT WO CONTRAST  Addendum Date: 08/29/2021   ADDENDUM REPORT: 08/29/2021 14:44 ADDENDUM: Incidental note of an enlarged left inguinal lymph node measuring up to 3.6 by 1.7 by 4.3 cm. This is nonspecific and may be reactive, however cannot exclude malignant involvement. This is increased from 2.4 by 0.9 cm on remote 11/08/2015 PET-CT. Recommend clinical correlation. Electronically Signed   By: Yvonne Kendall M.D.   On: 08/29/2021 14:44   Result Date: 08/29/2021 CLINICAL DATA:  Unwitnessed fall. Hip surgical planning. Left hip pain and shortening and rotation. CT planning for surgery. EXAM: CT OF THE LEFT HIP WITHOUT CONTRAST TECHNIQUE: Multidetector CT imaging of the left hip was performed according to the standard protocol. Multiplanar CT image reconstructions were also generated. RADIATION DOSE REDUCTION: This exam was performed according to the departmental dose-optimization program which includes automated exposure control, adjustment of the mA and/or kV according to patient size and/or use of iterative reconstruction technique. COMPARISON:  Left hip radiographs 08/29/2021; CT abdomen and pelvis 10/20/2015 FINDINGS: Bones/Joint/Cartilage Mild pubic symphysis joint space narrowing and peripheral degenerative spurring. No  cortical erosion or suspicious bone marrow lesion is seen, with attention to the superomedial aspect of the right pubic body questioned on radiograph. The relative lucency may be due to overlying bowel gas within the rectum on that radiograph. There is a comminuted acute fracture of the distal femoral neck and intertrochanteric proximal left femur. There is mild posterior angulation of the intervening fracture fragments (axial series 2, image 47). External rotation of the greater trochanter region of the proximal femur. Multiple fracture lines extend through the greater trochanter. Mild-to-moderate superior impaction of the distal fracture component. Mild-to-moderate varus angulation. Nondisplaced fracture line extends through the anterior aspect of the lesser trochanter (coronal series 6, image 48). Mild-to-moderate left femoroacetabular joint space narrowing and peripheral osteophytosis. Partial visualization of old healed right inferior pubic ramus fracture. Ligaments Suboptimally assessed by CT. Muscles and Tendons Normal density and size of the regional musculature. Soft tissues Mild diverticulosis at the junction of the distal descending colon and proximal sigmoid colon. There is a wide-mouth partially visualized midline ventral hernia of the pelvis seen diffusely. At the inferior aspect, there is a component that is smaller in transverse dimension and contains mesenteric fat and nondistended loops of small bowel, measuring up to 4.8 x 4.1 cm (transverse by AP, as measured on axial series 2, image 50). There also appears to be a wide mouth hernia lateral to the rectus abdominis musculature within the anterior left pelvis containing mesenteric fat and nondistended loops of small  bowel. The uterus appears to be surgically absent. IMPRESSION: 1. Acute, comminuted, mildly displaced, mild to moderately impacted, and mildly varus angulated proximal left femoral fracture of the junction of the distal femoral neck and  intertrochanteric region. 2. Mild pubic symphysis osteoarthritis. No cortical erosion or suspicious bone marrow lesion is seen. 3. Old healed right inferior pubic ramus fracture. 4. Midline and left paramidline ventral abdominal and pelvic hernias containing mesenteric fat and nondistended loops of small bowel. Electronically Signed: By: Yvonne Kendall M.D. On: 08/29/2021 11:43   DG Knee Left Port  Result Date: 08/29/2021 CLINICAL DATA:  Acute left hip fracture.  Pain. EXAM: PORTABLE LEFT KNEE - 1-2 VIEW COMPARISON:  Left knee radiographs 04/26/2020 FINDINGS: There are 2 new screws from inferior approach fixating the previously seen transverse fracture of the mid height of the patella. There is now normal alignment of the fracture components with resolution of the prior diastasis. Moderate patellofemoral joint space narrowing and peripheral osteophytosis. Mild-to-moderate medial and lateral compartment joint space narrowing and peripheral osteophytosis. No acute fracture or dislocation. No joint effusion. Resolution of the prior prepatellar soft tissue swelling/hematoma. IMPRESSION: Interval ORIF of the prior transverse fracture of the midpole of the patella with resolution of the prior bone distraction. Electronically Signed   By: Yvonne Kendall M.D.   On: 08/29/2021 11:45   DG C-Arm 1-60 Min-No Report  Result Date: 08/30/2021 Fluoroscopy was utilized by the requesting physician.  No radiographic interpretation.   DG HIP UNILAT WITH PELVIS 1V LEFT  Result Date: 08/30/2021 CLINICAL DATA:  Left IM nail EXAM: DG HIP (WITH OR WITHOUT PELVIS) 1V*L*; DG C-ARM 1-60 MIN-NO REPORT COMPARISON:  CT left hip dated 08/29/2021 Fluoroscopy time: 1 minute 8 seconds 16.98 mGy FINDINGS: IM nail with dynamic hip screw fixation of an intertrochanteric left hip fracture. Fracture fragments are in near anatomic alignment and position. IMPRESSION: Status post ORIF of a left hip fracture, as above. Electronically Signed   By:  Julian Hy M.D.   On: 08/30/2021 20:09   DG Hip Unilat With Pelvis 2-3 Views Left  Result Date: 08/29/2021 CLINICAL DATA:  fall, pain EXAM: DG HIP (WITH OR WITHOUT PELVIS) 2-3V LEFT COMPARISON:  CT 10/20/2015 FINDINGS: There is an acute, mildly displaced left-sided intertrochanteric fracture. There are chronic right superior and inferior pubic rami fractures. There is an indistinct appearance of the superior cortex of the right pubic body with adjacent osseous lucency. Mild, left greater than right hip osteoarthritis. Mild degenerative changes of the sacroiliac joints. IMPRESSION: Acute, mildly displaced left intertrochanteric femur fracture. Possible lucent lesion within the right superior pubic body, recommend pelvis CT. Chronic right superior and inferior pubic rami fractures. Electronically Signed   By: Maurine Simmering M.D.   On: 08/29/2021 09:29    Assessment/Plan  1. Closed fracture of left hip requiring operative repair, sequela WBAT on Aspirin BID Pain Controlled with Oxycodone Working with therapy  2. Seizure disorder ([place]) Stable on Keprra and Lamictal  3. Restless legs syndrome (RLS) Mirapex QHS  4. Panlobular emphysema ([place]) Doing well with Spiriva Proair PRn  5. Essential hypertension Off Norvasc for now  6. Gastroesophageal reflux disease, unspecified whether esophagitis present Continue Protonix  7. Senile dementia ([place]) Highly Functional On Namenda  8. Mixed hyperlipidemia On simvastatin Needs Lipid panel  9. Bilateral lower extremity edema Off Lasix . Will uses PRn   10. Senile osteoporosis Was on Fosmax took herself of it and does not want to take anything anymore  Family/ staff  Communication:   Labs/tests ordered:

## 2021-09-11 NOTE — Telephone Encounter (Signed)
Transition Care Management Unsuccessful Follow-up Telephone Call  Date of discharge and from where:  [facility], 09/08/2021  Attempts:  1st Attempt  Reason for unsuccessful TCM follow-up call:  Unable to reach patient, Unable to be reached ;due to release into a skilled nursing facility.

## 2021-09-14 ENCOUNTER — Other Ambulatory Visit: Payer: Self-pay | Admitting: Adult Health

## 2021-09-14 MED ORDER — OXYCODONE HCL 5 MG PO TABA: 5.0000 mg | ORAL_TABLET | Freq: Four times a day (QID) | ORAL | 0 refills | Status: DC | PRN

## 2021-09-17 DIAGNOSIS — S72142A Displaced intertrochanteric fracture of left femur, initial encounter for closed fracture: Secondary | ICD-10-CM | POA: Diagnosis not present

## 2021-09-24 ENCOUNTER — Ambulatory Visit: Payer: Self-pay | Admitting: Student

## 2021-09-25 ENCOUNTER — Non-Acute Institutional Stay (SKILLED_NURSING_FACILITY): Payer: Medicare PPO | Admitting: Internal Medicine

## 2021-09-25 ENCOUNTER — Encounter: Payer: Self-pay | Admitting: Internal Medicine

## 2021-09-25 DIAGNOSIS — S72002S Fracture of unspecified part of neck of left femur, sequela: Secondary | ICD-10-CM | POA: Diagnosis not present

## 2021-09-25 DIAGNOSIS — L89152 Pressure ulcer of sacral region, stage 2: Secondary | ICD-10-CM | POA: Diagnosis not present

## 2021-09-25 DIAGNOSIS — G40909 Epilepsy, unspecified, not intractable, without status epilepticus: Secondary | ICD-10-CM

## 2021-09-25 DIAGNOSIS — G2581 Restless legs syndrome: Secondary | ICD-10-CM

## 2021-09-25 NOTE — Progress Notes (Signed)
Location:  [facility]  [phone])  Provider: [name], MD   Code Status: DNR Goals of Care:     09/10/2021    1:31 PM  Advanced Directives  Does Patient Have a Medical Advance Directive? Yes  Type of Advance Directive Out of facility DNR (pink MOST or yellow form)  Does patient want to make changes to medical advance directive? No - Patient declined  Pre-existing out of facility DNR order (yellow form or pink MOST form) Pink MOST/Yellow Form most recent copy in chart - Physician notified to receive inpatient order     No chief complaint on file.   HPI: Patient is a 88 y.o. female seen today for an acute visit for Stage 2 pressure wound in her Lower back area  Patient lives in SNF in [place]   Patient has h/o COPD, Hypertension,Seizure Disorder,Mild Cognitive impairment, Hyperlipidemia Chronic Venous Changes in LE  Left Humerus Fracture with Displacement Sustained Left Patellar Fracture due to Mechanical fall  H/o Thyroid Nodules   Admitted in the hospital from 5/24-6/03 for Left Hip Fracture Underwent Intramedullary fixation  after a fall  She wanted me to see her today. CNA had noticed a small blister in her back which is now covered with   NO Pian Or discharge Patient is Toe Touch Weight bearing per Ortho due to some movement noticed in her Surgical site Her pain is controlled on Oxycodone Bowels are moving well  Past Medical History:  Diagnosis Date   Acute bronchitis 05/23/2011   Acute upper respiratory infections of unspecified site 05/23/2011   Arthritis    Chronic airway obstruction, not elsewhere classified 05/23/2011   Disturbance of salivary secretion 01/31/2011   Dizziness and giddiness 01/31/2011   Dyspnea    Essential tremor 04/25/2014   External hemorrhoids without mention of complication 04/16/3233   Gait disorder 04/25/2014   Insomnia, unspecified 09/12/2011   Lumbago 01/31/2011   Major depressive disorder,  single episode, unspecified 01/31/2011   Memory disorder 04/25/2014   Mitral valve disorders(424.0) 01/31/2011   Other and unspecified hyperlipidemia 01/31/2011   Other convulsions 01/31/2011   Other emphysema ([place]) 01/31/2011   Pain in joint, site unspecified 01/31/2011   Restless legs syndrome (RLS) 09/12/2011   Retinal detachment with retinal defect of right eye 2011   right eye twice   Seizure disorder ([place])    Senile osteoporosis 01/31/2011   Spontaneous ecchymoses 01/31/2011   Stiffness of joints, not elsewhere classified, multiple sites 01/31/2011   Unspecified essential hypertension 01/31/2011    Past Surgical History:  Procedure Laterality Date   ABDOMINAL HYSTERECTOMY  06/21/2003   TAH/BSO, omenectomy PSB resect, Stg IC cystadenofibroma   CHOLECYSTECTOMY  2005   Dr. Marlou Starks   ELBOW SURGERY Right 2008   broken   Dr. Apolonio Schneiders   EYE SURGERY     INTRAMEDULLARY (IM) NAIL INTERTROCHANTERIC Left 08/30/2021   Procedure: INTRAMEDULLARY (IM) NAIL INTERTROCHANTRIC;  Surgeon: Rod Can, MD;  Location: [facility];  Service: Orthopedics;  Laterality: Left;   ORIF PATELLA Left 05/02/2020   Procedure: OPEN REDUCTION INTERNAL (ORIF) FIXATION LEFT PATELLA WITH MEDIAL AND LATERAL LIGAMENT REINFORCEMENTS;  Surgeon: [name], MD;  Location: [facility];  Service: Orthopedics;  Laterality: Left;   ORIF PATELLA Left 05/30/2020   Procedure: OPEN REDUCTION INTERNAL (ORIF) FIXATION PATELLA;  Surgeon: [name], MD;  Location: [facility];  Service: Orthopedics;  Laterality: Left;   RETINAL DETACHMENT SURGERY N/A  two   REVERSE SHOULDER ARTHROPLASTY Left 05/06/2019   Procedure: REVERSE SHOULDER ARTHROPLASTY;  Surgeon: Justice Britain, MD;  Location: [facility];  Service: Orthopedics;  Laterality: Left;  113mn   ROTATOR CUFF REPAIR Right 2012   Dr. CTheda Sers  SQUAMOUS CELL CARCINOMA EXCISION Bilateral 2012, 8/14   [place] on legs   Dr. GSarajane Jews  TONSILLECTOMY  1941   VIDEO BRONCHOSCOPY WITH  ENDOBRONCHIAL NAVIGATION N/A 11/29/2015   Procedure: VIDEO BRONCHOSCOPY WITH ENDOBRONCHIAL NAVIGATION;  Surgeon: RCollene Gobble MD;  [facility] [facility]  Service: Thoracic;  Laterality: N/A;    Allergies  Allergen Reactions   Vioxx [Rofecoxib] Shortness Of Breath   Dyazide [Hydrochlorothiazide W-Triamterene] Other (See Comments)    Lowers blood pressure too much   Latex Other (See Comments)    Swelling   Sulfa Antibiotics Nausea And Vomiting   Sumycin [Tetracycline]     Can't take due to drug reaction    Outpatient Encounter Medications as of 09/25/2021  Medication Sig   acetaminophen (TYLENOL) 500 MG tablet Take 1,000 mg by mouth every 6 (six) hours as needed (pain).   Albuterol Sulfate (PROAIR RESPICLICK) 1932(90 Base) MCG/ACT AEPB Inhale 2 puffs into the lungs daily as needed. Every AM PRN. Patient can keep it with her in room   aspirin (ASPIRIN CHILDRENS) 81 MG chewable tablet Chew 1 tablet (81 mg total) by mouth 2 (two) times daily with a meal.   cholecalciferol (VITAMIN D) 1000 units tablet Take 1 tablet (1,000 Units total) by mouth daily.   hydrocortisone 2.5 % lotion thin layer; topical,Twice A Day - PRN Special Instructions: apply to face and ears [DX: Psoriasis, unspecified]   hydrocortisone cream 1 % Apply 1 application. topically 2 (two) times daily as needed (psoriasis).   ketoconazole (NIZORAL) 2 % cream Apply 1 application. topically daily as needed.   ketoconazole (NIZORAL) 2 % shampoo Apply 1 application. topically See admin instructions. Monday, Thursday   lamoTRIgine (LAMICTAL) 150 MG tablet Take 150 mg by mouth 2 (two) times daily.   levETIRAcetam (KEPPRA) 250 MG tablet Take 250 mg by mouth daily. In the morning   levETIRAcetam (KEPPRA) 500 MG tablet Take 500 mg by mouth at bedtime.   memantine (NAMENDA) 10 MG tablet TAKE 1 TABLET BY MOUTH TWICE DAILY.   nystatin (MYCOSTATIN/NYSTOP) powder Apply 1 application. topically 2 (two) times daily as needed (Apply to  urogenital area).   OxyCODONE HCl, Abuse Deter, (OXAYDO) 5 MG TABA Take 5 mg by mouth every 6 (six) hours as needed.  Nondisplaced transverse fracture of left patella, subsequent encounter for closed fracture with routine healing   pantoprazole (PROTONIX) 40 MG tablet Take 40 mg by mouth daily.   pramipexole (MIRAPEX) 0.25 MG tablet Take 0.5 mg by mouth at bedtime.   sennosides-docusate sodium (SENOKOT-S) 8.6-50 MG tablet Take 2 tablets by mouth at bedtime.   simvastatin (ZOCOR) 10 MG tablet Take 10 mg by mouth at bedtime.   tiotropium (SPIRIVA HANDIHALER) 18 MCG inhalation capsule INHALE CONTENTS OF ONE CAPSULE ONCE DAILY FOR COPD.   triamcinolone (KENALOG) 0.1 % Apply 1 application. topically daily as needed (psoriasis).   No facility-administered encounter medications on file as of 09/25/2021.    Review of Systems:  Review of Systems  Constitutional:  Positive for activity change. Negative for appetite change.  HENT: Negative.    Respiratory:  Negative for cough and shortness of breath.   Cardiovascular:  Positive for leg swelling.  Gastrointestinal:  Negative for constipation.  Genitourinary:  Negative.   Musculoskeletal:  Positive for gait problem. Negative for arthralgias and myalgias.  Skin:  Positive for wound.  Neurological:  Negative for dizziness and weakness.  Psychiatric/Behavioral:  Negative for confusion, dysphoric mood and sleep disturbance.     Health Maintenance  Topic Date Due   INFLUENZA VACCINE  11/06/2021   TETANUS/TDAP  08/16/2025   Pneumonia Vaccine 48+ Years old  Completed   DEXA SCAN  Completed   Zoster Vaccines- Shingrix  Completed   HPV VACCINES  Aged Out   MAMMOGRAM  Discontinued   COVID-19 Vaccine  Discontinued    Physical Exam: There were no vitals filed for this visit. There is no height or weight on file to calculate BMI. Physical Exam Vitals reviewed.  Constitutional:      Appearance: Normal appearance.  HENT:     Head: Normocephalic.      Nose: Nose normal.     Mouth/Throat:     Mouth: Mucous membranes are moist.     Pharynx: Oropharynx is clear.  Eyes:     Pupils: Pupils are equal, round, and reactive to light.  Cardiovascular:     Rate and Rhythm: Normal rate and regular rhythm.     Pulses: Normal pulses.     Heart sounds: Normal heart sounds. No murmur heard. Pulmonary:     Effort: Pulmonary effort is normal.     Breath sounds: Normal breath sounds.  Abdominal:     General: Abdomen is flat. Bowel sounds are normal.     Palpations: Abdomen is soft.  Musculoskeletal:     Cervical back: Neck supple.     Comments: Chronic Venous changes in legs  Skin:    General: Skin is warm.     Comments: Small pressure wound in her sacral area Stage 2 not tender or Red No Discharge  Neurological:     General: No focal deficit present.     Mental Status: She is alert and oriented to person, place, and time.  Psychiatric:        Mood and Affect: Mood normal.        Thought Content: Thought content normal.     Labs reviewed: Basic Metabolic Panel: Recent Labs    05/22/21 0000 08/29/21 0930 09/01/21 0307 09/02/21 0359 09/04/21 1308 09/06/21 0449 09/11/21 0000  NA 141   < > 139 138 144  --  142  K 4.0   < > 3.3* 3.2* 3.9  --  4.3  CL 106   < > 106 104 108  --  109*  CO2 29*   < > '28 27 26  '$ --  26*  GLUCOSE  --    < > 99 98 102*  --   --   BUN 23*   < > 23 21 31*  --  23*  CREATININE 0.9   < > 0.83 0.78 0.88 0.78 0.6  CALCIUM 9.2   < > 8.7* 8.5* 8.8*  --  8.7  MG  --   --   --  2.2  --   --   --   TSH 1.89  --   --   --   --   --   --    < > = values in this interval not displayed.   Liver Function Tests: Recent Labs    05/22/21 0000  AST 16  ALT 12  ALBUMIN 4.1   No results for input(s): "LIPASE", "AMYLASE" in the last 8760 hours. No results for input(s): "  AMMONIA" in the last 8760 hours. CBC: Recent Labs    05/22/21 0000 08/29/21 0930 08/30/21 0323 09/01/21 0307 09/02/21 0359 09/04/21 1308  09/11/21 0000  WBC 3.9 3.9*   < > 6.5 7.8 7.7 6.3  NEUTROABS 1,962.00 2.3  --   --   --   --  4,416.00  HGB 11.5* 12.6   < > 9.4* 9.4* 10.0* 10.0*  HCT 36 39.9   < > 31.1* 30.6* 32.5* 32*  MCV  --  84.2   < > 86.1 85.2 86.4  --   PLT 220 218   < > 163 190 239 422*   < > = values in this interval not displayed.   Lipid Panel: Recent Labs    11/23/20 0000  CHOL 169  HDL 64  LDLCALC 88  TRIG 80   No results found for: "HGBA1C"  Procedures since last visit: DG HIP UNILAT WITH PELVIS 1V LEFT  Result Date: 08/30/2021 CLINICAL DATA:  Left IM nail EXAM: DG HIP (WITH OR WITHOUT PELVIS) 1V*L*; DG C-ARM 1-60 MIN-NO REPORT COMPARISON:  CT left hip dated 08/29/2021 Fluoroscopy time: 1 minute 8 seconds 16.98 mGy FINDINGS: IM nail with dynamic hip screw fixation of an intertrochanteric left hip fracture. Fracture fragments are in near anatomic alignment and position. IMPRESSION: Status post ORIF of a left hip fracture, as above. Electronically Signed   By: Julian Hy M.D.   On: 08/30/2021 20:09   DG C-Arm 1-60 Min-No Report  Result Date: 08/30/2021 Fluoroscopy was utilized by the requesting physician.  No radiographic interpretation.   Pelvis Portable  Result Date: 08/30/2021 CLINICAL DATA:  Status post left femur ORIF EXAM: PORTABLE PELVIS 1-2 VIEWS COMPARISON:  08/29/2021 FINDINGS: Status post gamma nail fixation of intertrochanteric/basicervical femur fracture. Components appear well positioned. Lesser trochanter is displaced relative to the preoperative imaging. IMPRESSION: Status post gamma nail fixation. Good position and alignment, with exception that the lesser trochanter is now displaced from its original position. Electronically Signed   By: Nelson Chimes M.D.   On: 08/30/2021 17:52   CT HIP LEFT WO CONTRAST  Addendum Date: 08/29/2021   ADDENDUM REPORT: 08/29/2021 14:44 ADDENDUM: Incidental note of an enlarged left inguinal lymph node measuring up to 3.6 by 1.7 by 4.3 cm. This is  nonspecific and may be reactive, however cannot exclude malignant involvement. This is increased from 2.4 by 0.9 cm on remote 11/08/2015 PET-CT. Recommend clinical correlation. Electronically Signed   By: Yvonne Kendall M.D.   On: 08/29/2021 14:44   Result Date: 08/29/2021 CLINICAL DATA:  Unwitnessed fall. Hip surgical planning. Left hip pain and shortening and rotation. CT planning for surgery. EXAM: CT OF THE LEFT HIP WITHOUT CONTRAST TECHNIQUE: Multidetector CT imaging of the left hip was performed according to the standard protocol. Multiplanar CT image reconstructions were also generated. RADIATION DOSE REDUCTION: This exam was performed according to the departmental dose-optimization program which includes automated exposure control, adjustment of the mA and/or kV according to patient size and/or use of iterative reconstruction technique. COMPARISON:  Left hip radiographs 08/29/2021; CT abdomen and pelvis 10/20/2015 FINDINGS: Bones/Joint/Cartilage Mild pubic symphysis joint space narrowing and peripheral degenerative spurring. No cortical erosion or suspicious bone marrow lesion is seen, with attention to the superomedial aspect of the right pubic body questioned on radiograph. The relative lucency may be due to overlying bowel gas within the rectum on that radiograph. There is a comminuted acute fracture of the distal femoral neck and intertrochanteric proximal left femur. There is  mild posterior angulation of the intervening fracture fragments (axial series 2, image 47). External rotation of the greater trochanter region of the proximal femur. Multiple fracture lines extend through the greater trochanter. Mild-to-moderate superior impaction of the distal fracture component. Mild-to-moderate varus angulation. Nondisplaced fracture line extends through the anterior aspect of the lesser trochanter (coronal series 6, image 48). Mild-to-moderate left femoroacetabular joint space narrowing and peripheral  osteophytosis. Partial visualization of old healed right inferior pubic ramus fracture. Ligaments Suboptimally assessed by CT. Muscles and Tendons Normal density and size of the regional musculature. Soft tissues Mild diverticulosis at the junction of the distal descending colon and proximal sigmoid colon. There is a wide-mouth partially visualized midline ventral hernia of the pelvis seen diffusely. At the inferior aspect, there is a component that is smaller in transverse dimension and contains mesenteric fat and nondistended loops of small bowel, measuring up to 4.8 x 4.1 cm (transverse by AP, as measured on axial series 2, image 50). There also appears to be a wide mouth hernia lateral to the rectus abdominis musculature within the anterior left pelvis containing mesenteric fat and nondistended loops of small bowel. The uterus appears to be surgically absent. IMPRESSION: 1. Acute, comminuted, mildly displaced, mild to moderately impacted, and mildly varus angulated proximal left femoral fracture of the junction of the distal femoral neck and intertrochanteric region. 2. Mild pubic symphysis osteoarthritis. No cortical erosion or suspicious bone marrow lesion is seen. 3. Old healed right inferior pubic ramus fracture. 4. Midline and left paramidline ventral abdominal and pelvic hernias containing mesenteric fat and nondistended loops of small bowel. Electronically Signed: By: Yvonne Kendall M.D. On: 08/29/2021 11:43   DG Knee Left Port  Result Date: 08/29/2021 CLINICAL DATA:  Acute left hip fracture.  Pain. EXAM: PORTABLE LEFT KNEE - 1-2 VIEW COMPARISON:  Left knee radiographs 04/26/2020 FINDINGS: There are 2 new screws from inferior approach fixating the previously seen transverse fracture of the mid height of the patella. There is now normal alignment of the fracture components with resolution of the prior diastasis. Moderate patellofemoral joint space narrowing and peripheral osteophytosis. Mild-to-moderate  medial and lateral compartment joint space narrowing and peripheral osteophytosis. No acute fracture or dislocation. No joint effusion. Resolution of the prior prepatellar soft tissue swelling/hematoma. IMPRESSION: Interval ORIF of the prior transverse fracture of the midpole of the patella with resolution of the prior bone distraction. Electronically Signed   By: Yvonne Kendall M.D.   On: 08/29/2021 11:45   DG Chest 1 View  Result Date: 08/29/2021 CLINICAL DATA:  fall, pain EXAM: CHEST  1 VIEW COMPARISON:  05/10/2019 FINDINGS: Chronically elevated left hemidiaphragm with left basilar atelectasis. Cardiomediastinal silhouette is unchanged. No large pleural effusion. No visible pneumothorax. Left reverse total shoulder arthroplasty. Right glenohumeral osteoarthritis with appearance of a sclerotic humeral head, possibly reflecting AVN. Possible anterior left lower rib fractures involving ribs 8 and 9. Dextroconvex lower thoracic/upper lumbar curvature. IMPRESSION: Possible nondisplaced fractures involving the anterior left 8 and ninth ribs. Chronically elevated left hemidiaphragm with left basilar subsegmental atelectasis. Sclerotic appearance of the right humeral head possibly reflecting AVN. Electronically Signed   By: Maurine Simmering M.D.   On: 08/29/2021 09:34   DG Hip Unilat With Pelvis 2-3 Views Left  Result Date: 08/29/2021 CLINICAL DATA:  fall, pain EXAM: DG HIP (WITH OR WITHOUT PELVIS) 2-3V LEFT COMPARISON:  CT 10/20/2015 FINDINGS: There is an acute, mildly displaced left-sided intertrochanteric fracture. There are chronic right superior and inferior pubic rami fractures. There  is an indistinct appearance of the superior cortex of the right pubic body with adjacent osseous lucency. Mild, left greater than right hip osteoarthritis. Mild degenerative changes of the sacroiliac joints. IMPRESSION: Acute, mildly displaced left intertrochanteric femur fracture. Possible lucent lesion within the right superior  pubic body, recommend pelvis CT. Chronic right superior and inferior pubic rami fractures. Electronically Signed   By: Maurine Simmering M.D.   On: 08/29/2021 09:29    Assessment/Plan 1. Pressure injury of sacral region, stage 2 ([place]) Use Piloe to keep Pressure off Foam dressing Will follow 2. Closed fracture of left hip requiring operative repair, sequela Pian Controlled on Oxycodone She is Toe Touch right now  Aspirin for Prophylaxis   Other issues Seizure disorder ([facility]) Stable on Keprra and Lamictal   3. Restless legs syndrome (RLS) Mirapex QHS   4. Panlobular emphysema ([place]) Doing well with Spiriva Proair PRn   5. Essential hypertension Off Norvasc for now   6. Gastroesophageal reflux disease, unspecified whether esophagitis present Continue Protonix   7. Senile dementia ([place]) Highly Functional On Namenda   8. Mixed hyperlipidemia On simvastatin Needs Lipid panel   9. Bilateral lower extremity edema Off Lasix . Will uses PRn     10. Senile osteoporosis Was on Fosmax took herself of it and does not want to take anything anymore   Labs/tests ordered:  * No order type specified * Next appt:  Visit date not found

## 2021-09-27 LAB — LIPID PANEL
Cholesterol: 147 (ref 0–200)
HDL: 48 (ref 35–70)
LDL Cholesterol: 78
LDl/HDL Ratio: 3.1
Triglycerides: 131 (ref 40–160)

## 2021-09-27 LAB — BASIC METABOLIC PANEL
BUN: 20 (ref 4–21)
CO2: 30 — AB (ref 13–22)
Chloride: 109 — AB (ref 99–108)
Creatinine: 0.8 (ref 0.5–1.1)
Glucose: 80
Potassium: 3.8 mEq/L (ref 3.5–5.1)
Sodium: 146 (ref 137–147)

## 2021-09-27 LAB — COMPREHENSIVE METABOLIC PANEL: Calcium: 9.1 (ref 8.7–10.7)

## 2021-10-03 ENCOUNTER — Encounter: Payer: Self-pay | Admitting: Adult Health

## 2021-10-03 ENCOUNTER — Other Ambulatory Visit: Payer: Self-pay | Admitting: Adult Health

## 2021-10-03 ENCOUNTER — Non-Acute Institutional Stay (SKILLED_NURSING_FACILITY): Payer: Medicare PPO | Admitting: Adult Health

## 2021-10-03 DIAGNOSIS — S72002S Fracture of unspecified part of neck of left femur, sequela: Secondary | ICD-10-CM | POA: Diagnosis not present

## 2021-10-03 DIAGNOSIS — F320 Major depressive disorder, single episode, mild: Secondary | ICD-10-CM | POA: Diagnosis not present

## 2021-10-03 MED ORDER — OXYCODONE HCL 5 MG PO TABA: 5.0000 mg | ORAL_TABLET | Freq: Four times a day (QID) | ORAL | 0 refills | Status: DC | PRN

## 2021-10-03 MED ORDER — MIRTAZAPINE 15 MG PO TABS: 15.0000 mg | ORAL_TABLET | Freq: Every day | ORAL | 3 refills | Status: DC

## 2021-10-03 NOTE — Progress Notes (Signed)
Location:  [facility] Room Number: NO/29/A Place of Service:  SNF (31) Provider:  Durenda Age, DNP, FNP-BC  Patient Care Team: [name], MD as PCP - General (Internal Medicine) [name], MD as Consulting Physician (Ophthalmology) [name], MD as Consulting Physician (Cardiology) [name], MD as Consulting Physician (Dermatology) [name], MD as Consulting Physician (Orthopedic Surgery) [name], MD as Consulting Physician (Orthopedic Surgery) [name], MD as Consulting Physician (Orthopedic Surgery) [place], [place], Man X, NP as Nurse Practitioner (Nurse Practitioner) [name], MD (Inactive) as Consulting Physician (Neurology)  Extended Emergency Contact Information Primary Emergency Contact: [name],[name] Address: [address]          [place], [place] [address] [name] of [place] Phone: [phone] Relation: Sister Secondary Emergency Contact: [name] [place] of [place] Phone: [phone] Mobile Phone: [phone] Relation: Sister  Code Status:  DNR  Goals of care: Advanced Directive information    10/03/2021    3:02 PM  Advanced Directives  Does Patient Have a Medical Advance Directive? Yes  Type of Advance Directive Out of facility DNR (pink MOST or yellow form)  Does patient want to make changes to medical advance directive? No - Patient declined  Pre-existing out of facility DNR order (yellow form or pink MOST form) Pink MOST/Yellow Form most recent copy in chart - Physician notified to receive inpatient order     Chief Complaint  Patient presents with   Acute Visit    Weight loss and depression     HPI:  Pt is a 88 y.o. female seen today for medical management of chronic diseases.  She is a long-term care resident of [facility]. She was reported to have weight loss of 9.7 lbs in a month. Resident  stated that she has poor appetite and feels depressed. Latest PHQ-9 score 7, 09/20/21. She denies having plans of hurting herself. She is currently having PT and OT.  She was recently hospitalized 08/29/2021 to 09/08/2021.  She has a PMH of emphysema, essential hypertension and seizure disorder.  She had a fall without loss of consciousness and sustained a left hip fracture for which she had intramedullary fixation on 08/30/2021.    Past Medical History:  Diagnosis Date   Acute bronchitis 05/23/2011   Acute upper respiratory infections of unspecified site 05/23/2011   Arthritis    Chronic airway obstruction, not elsewhere classified 05/23/2011   Disturbance of salivary secretion 01/31/2011   Dizziness and giddiness 01/31/2011   Dyspnea    Essential tremor 04/25/2014   External hemorrhoids without mention of complication 98/33/8250   Gait disorder 04/25/2014   Insomnia, unspecified 09/12/2011   Lumbago 01/31/2011   Major depressive disorder, single episode, unspecified 01/31/2011   Memory disorder 04/25/2014   Mitral valve disorders(424.0) 01/31/2011   Other and unspecified hyperlipidemia 01/31/2011   Other convulsions 01/31/2011   Other emphysema ([place]) 01/31/2011   Pain in joint, site unspecified 01/31/2011   Restless legs syndrome (RLS) 09/12/2011   Retinal detachment with retinal defect of right eye 2011   right eye twice   Seizure disorder ([place])    Senile osteoporosis 01/31/2011   Spontaneous ecchymoses 01/31/2011   Stiffness of joints, not elsewhere classified, multiple sites 01/31/2011   Unspecified essential hypertension 01/31/2011   Past Surgical History:  Procedure Laterality Date   ABDOMINAL HYSTERECTOMY  06/21/2003   TAH/BSO, omenectomy PSB  resect, Stg IC cystadenofibroma   CHOLECYSTECTOMY  2005   Dr. Marlou Starks   ELBOW SURGERY Right 2008   broken   Dr. Apolonio Schneiders   EYE SURGERY     INTRAMEDULLARY (IM) NAIL INTERTROCHANTERIC Left 08/30/2021   Procedure: INTRAMEDULLARY (IM) NAIL  INTERTROCHANTRIC;  Surgeon: Rod Can, MD;  Location: [facility];  Service: Orthopedics;  Laterality: Left;   ORIF PATELLA Left 05/02/2020   Procedure: OPEN REDUCTION INTERNAL (ORIF) FIXATION LEFT PATELLA WITH MEDIAL AND LATERAL LIGAMENT REINFORCEMENTS;  Surgeon: [name], MD;  Location: [facility];  Service: Orthopedics;  Laterality: Left;   ORIF PATELLA Left 05/30/2020   Procedure: OPEN REDUCTION INTERNAL (ORIF) FIXATION PATELLA;  Surgeon: [name], MD;  Location: [facility];  Service: Orthopedics;  Laterality: Left;   RETINAL DETACHMENT SURGERY N/A    two   REVERSE SHOULDER ARTHROPLASTY Left 05/06/2019   Procedure: REVERSE SHOULDER ARTHROPLASTY;  Surgeon: Justice Britain, MD;  Location: [facility];  Service: Orthopedics;  Laterality: Left;  126mn   ROTATOR CUFF REPAIR Right 2012   Dr. CTheda Sers  SQUAMOUS CELL CARCINOMA EXCISION Bilateral 2012, 8/14   Mohns on legs   Dr. GSarajane Jews  TONSILLECTOMY  1941   VIDEO BRONCHOSCOPY WITH ENDOBRONCHIAL NAVIGATION N/A 11/29/2015   Procedure: VIDEO BRONCHOSCOPY WITH ENDOBRONCHIAL NAVIGATION;  Surgeon: RCollene Gobble MD;  [facility]  Service: Thoracic;  Laterality: N/A;    Allergies  Allergen Reactions   Vioxx [Rofecoxib] Shortness Of Breath   Dyazide [Hydrochlorothiazide W-Triamterene] Other (See Comments)    Lowers blood pressure too much   Latex Other (See Comments)    Swelling   Sulfa Antibiotics Nausea And Vomiting   Sumycin [Tetracycline]     Can't take due to drug reaction    Outpatient Encounter Medications as of 10/03/2021  Medication Sig   acetaminophen (TYLENOL) 500 MG tablet Take 1,000 mg by mouth every 6 (six) hours as needed (pain).   Albuterol Sulfate (PROAIR RESPICLICK) 1875(90 Base) MCG/ACT AEPB Inhale 2 puffs into the lungs daily as needed. Every AM PRN. Patient can keep it with her in room   aspirin (ASPIRIN CHILDRENS) 81 MG chewable tablet Chew 1 tablet (81 mg total) by mouth 2 (two) times daily with a meal.    cholecalciferol (VITAMIN D) 1000 units tablet Take 1 tablet (1,000 Units total) by mouth daily.   hydrocortisone 2.5 % lotion thin layer; topical,Twice A Day - PRN Special Instructions: apply to face and ears [DX: Psoriasis, unspecified]   hydrocortisone cream 1 % Apply 1 application. topically 2 (two) times daily as needed (psoriasis).   ketoconazole (NIZORAL) 2 % cream Apply 1 application. topically daily as needed.   ketoconazole (NIZORAL) 2 % shampoo Apply 1 application. topically See admin instructions. Monday, Thursday   lamoTRIgine (LAMICTAL) 150 MG tablet Take 150 mg by mouth 2 (two) times daily.   levETIRAcetam (KEPPRA) 250 MG tablet Take 250 mg by mouth daily. In the morning   levETIRAcetam (KEPPRA) 500 MG tablet Take 500 mg by mouth at bedtime.   memantine (NAMENDA) 10 MG tablet TAKE 1 TABLET BY MOUTH TWICE DAILY.   mirtazapine (REMERON) 15 MG tablet Take 1 tablet (15 mg total) by mouth at bedtime.   nystatin (MYCOSTATIN/NYSTOP) powder Apply 1 application. topically 2 (two) times daily as needed (Apply to urogenital area).   OxyCODONE HCl, Abuse Deter, (OXAYDO) 5 MG TABA Take 5 mg by mouth every 6 (six) hours as needed.  Nondisplaced transverse fracture of left patella, subsequent encounter  for closed fracture with routine healing   pantoprazole (PROTONIX) 40 MG tablet Take 40 mg by mouth daily.   pramipexole (MIRAPEX) 0.25 MG tablet Take 0.5 mg by mouth at bedtime.   sennosides-docusate sodium (SENOKOT-S) 8.6-50 MG tablet Take 2 tablets by mouth at bedtime.   simvastatin (ZOCOR) 10 MG tablet Take 10 mg by mouth at bedtime.   tiotropium (SPIRIVA HANDIHALER) 18 MCG inhalation capsule INHALE CONTENTS OF ONE CAPSULE ONCE DAILY FOR COPD.   triamcinolone (KENALOG) 0.1 % Apply 1 application. topically daily as needed (psoriasis).   No facility-administered encounter medications on file as of 10/03/2021.    Review of Systems  Constitutional:  Positive for appetite change and unexpected  weight change. Negative for chills, fatigue and fever.  HENT:  Negative for congestion, hearing loss, rhinorrhea and sore throat.   Eyes: Negative.   Respiratory:  Negative for cough, shortness of breath and wheezing.   Cardiovascular:  Negative for chest pain, palpitations and leg swelling.  Gastrointestinal:  Negative for abdominal pain, constipation, diarrhea, nausea and vomiting.  Genitourinary:  Negative for dysuria.  Musculoskeletal:  Negative for arthralgias, back pain and myalgias.  Skin:  Negative for color change, rash and wound.  Neurological:  Negative for dizziness, weakness and headaches.  Psychiatric/Behavioral:  Negative for behavioral problems. The patient is not nervous/anxious.        Immunization History  Administered Date(s) Administered   Influenza Split 01/06/2014, 01/15/2017, 01/08/2018, 12/09/2018   Influenza Whole 01/07/2012, 01/06/2013   Influenza, High Dose Seasonal PF 12/25/2015, 01/20/2017   Influenza,inj,Quad PF,6+ Mos 12/21/2014   Influenza-Unspecified 12/09/2018, 01/18/2020, 01/24/2021   Moderna SARS-COV2 Booster Vaccination 04/02/2021   Moderna Sars-Covid-2 Vaccination 04/12/2019, 06/05/2019, 02/15/2020, 09/05/2020   PFIZER(Purple Top)SARS-COV-2 Vaccination 12/26/2020   Pneumococcal Conjugate-13 02/01/2014   Pneumococcal Polysaccharide-23 12/20/1992, 01/15/2000, 04/08/2004, 06/08/2004   Td 04/08/2002, 04/22/2002   Tdap 04/09/2011, 08/17/2015   Zoster Recombinat (Shingrix) 05/29/2005, 08/27/2017   Zoster, Live 04/08/2008, 05/29/2014, 05/28/2017   Zoster, Unspecified 05/29/2005   Pertinent  Health Maintenance Due  Topic Date Due   INFLUENZA VACCINE  11/06/2021   DEXA SCAN  Completed   MAMMOGRAM  Discontinued      09/06/2021    8:00 AM 09/06/2021    7:45 PM 09/07/2021    9:00 AM 09/07/2021    9:30 PM 09/08/2021   12:40 PM  Fall Risk  Patient Fall Risk Level High fall risk High fall risk High fall risk High fall risk High fall risk     Vitals:    10/03/21 1506  BP: 128/84  Pulse: 74  Resp: 18  Temp: (!) 96.9 F (36.1 C)  SpO2: 91%  Weight: 154 lb 4.8 oz (70 kg)  Height: '5\' 2"'$  (1.575 m)   Body mass index is 28.22 kg/m.  Physical Exam Constitutional:      General: She is not in acute distress.    Appearance: Normal appearance.  HENT:     Head: Normocephalic and atraumatic.     Nose: Nose normal.     Mouth/Throat:     Mouth: Mucous membranes are moist.  Eyes:     Conjunctiva/sclera: Conjunctivae normal.  Cardiovascular:     Rate and Rhythm: Normal rate and regular rhythm.  Pulmonary:     Effort: Pulmonary effort is normal.     Breath sounds: Normal breath sounds.  Abdominal:     General: Bowel sounds are normal.     Palpations: Abdomen is soft.  Musculoskeletal:     Cervical back: Normal  range of motion.  Skin:    General: Skin is warm and dry.     Comments: Has sacral wound  Neurological:     General: No focal deficit present.     Mental Status: She is alert and oriented to person, place, and time.  Psychiatric:        Behavior: Behavior normal.        Thought Content: Thought content normal.        Judgment: Judgment normal.     Comments: Sad affect        Labs reviewed: Recent Labs    09/01/21 0307 09/02/21 0359 09/04/21 1308 09/06/21 0449 09/11/21 0000  NA 139 138 144  --  142  K 3.3* 3.2* 3.9  --  4.3  CL 106 104 108  --  109*  CO2 '28 27 26  '$ --  26*  GLUCOSE 99 98 102*  --   --   BUN 23 21 31*  --  23*  CREATININE 0.83 0.78 0.88 0.78 0.6  CALCIUM 8.7* 8.5* 8.8*  --  8.7  MG  --  2.2  --   --   --    Recent Labs    05/22/21 0000  AST 16  ALT 12  ALBUMIN 4.1   Recent Labs    05/22/21 0000 08/29/21 0930 08/30/21 0323 09/01/21 0307 09/02/21 0359 09/04/21 1308 09/11/21 0000  WBC 3.9 3.9*   < > 6.5 7.8 7.7 6.3  NEUTROABS 1,962.00 2.3  --   --   --   --  4,416.00  HGB 11.5* 12.6   < > 9.4* 9.4* 10.0* 10.0*  HCT 36 39.9   < > 31.1* 30.6* 32.5* 32*  MCV  --  84.2   < > 86.1 85.2  86.4  --   PLT 220 218   < > 163 190 239 422*   < > = values in this interval not displayed.   Lab Results  Component Value Date   TSH 1.89 05/22/2021   No results found for: "HGBA1C" Lab Results  Component Value Date   CHOL 169 11/23/2020   HDL 64 11/23/2020   LDLCALC 88 11/23/2020   TRIG 80 11/23/2020   CHOLHDL 3.6 06/27/2015    Significant Diagnostic Results in last 30 days:  No results found.  Assessment/Plan  1. Current mild episode of major depressive disorder without prior episode (HCC) -    PHQ-9 score 7, ranging in mild depression -   will start on Remeron 15 mg Q HS -   monitor food intake  2. Closed fracture of left hip, sequela -  S/P intramedullary fixation on 08/30/2021 -   Currently on toe-touch weightbearing -   Continue aspirin 81 mg twice a day for DVT prophylaxis -    Continue PT and OT, for therapeutic strengthening exercises -    Fall precautions  Family/ staff Communication: Discussed plan of care with resident and charge nurse.  Labs/tests ordered:   None    Durenda Age, DNP, MSN, FNP-BC [facility] [phone] (Monday-Friday 8:00 a.m. - 5:00 p.m.) [phone] (after hours)

## 2021-10-04 DIAGNOSIS — N390 Urinary tract infection, site not specified: Secondary | ICD-10-CM | POA: Diagnosis not present

## 2021-10-10 DIAGNOSIS — N390 Urinary tract infection, site not specified: Secondary | ICD-10-CM | POA: Diagnosis not present

## 2021-10-12 ENCOUNTER — Encounter: Payer: Self-pay | Admitting: Adult Health

## 2021-10-12 ENCOUNTER — Non-Acute Institutional Stay (SKILLED_NURSING_FACILITY): Payer: Medicare PPO | Admitting: Adult Health

## 2021-10-12 DIAGNOSIS — G40909 Epilepsy, unspecified, not intractable, without status epilepticus: Secondary | ICD-10-CM

## 2021-10-12 DIAGNOSIS — R638 Other symptoms and signs concerning food and fluid intake: Secondary | ICD-10-CM | POA: Diagnosis not present

## 2021-10-12 DIAGNOSIS — R451 Restlessness and agitation: Secondary | ICD-10-CM | POA: Diagnosis not present

## 2021-10-12 DIAGNOSIS — I10 Essential (primary) hypertension: Secondary | ICD-10-CM | POA: Diagnosis not present

## 2021-10-12 LAB — COMPREHENSIVE METABOLIC PANEL
Albumin: 4 (ref 3.5–5.0)
Calcium: 9.4 (ref 8.7–10.7)
Globulin: 2.7

## 2021-10-12 LAB — CBC AND DIFFERENTIAL
HCT: 40 (ref 36–46)
Hemoglobin: 12.7 (ref 12.0–16.0)
Neutrophils Absolute: 2712
Platelets: 363 10*3/uL (ref 150–400)
WBC: 4.7

## 2021-10-12 LAB — BASIC METABOLIC PANEL
BUN: 17 (ref 4–21)
CO2: 29 — AB (ref 13–22)
Chloride: 107 (ref 99–108)
Creatinine: 0.9 (ref 0.5–1.1)
Glucose: 90
Potassium: 3.7 mEq/L (ref 3.5–5.1)
Sodium: 145 (ref 137–147)

## 2021-10-12 LAB — HEPATIC FUNCTION PANEL
ALT: 9 U/L (ref 7–35)
AST: 18 (ref 13–35)
Alkaline Phosphatase: 224 — AB (ref 25–125)
Bilirubin, Total: 0.3

## 2021-10-12 LAB — CBC: RBC: 4.76 (ref 3.87–5.11)

## 2021-10-12 NOTE — Progress Notes (Unsigned)
Location:  [facility] Room Number: NO/29/A Place of Service:  SNF (31) Provider:  Durenda Age, DNP, FNP-BC  Patient Care Team: [name], MD as PCP - General (Internal Medicine) [name], MD as Consulting Physician (Ophthalmology) [name], MD as Consulting Physician (Cardiology) [name], MD as Consulting Physician (Dermatology) [name], MD as Consulting Physician (Orthopedic Surgery) [name], MD as Consulting Physician (Orthopedic Surgery) [name], MD as Consulting Physician (Orthopedic Surgery) [place], [place], Man X, NP as Nurse Practitioner (Nurse Practitioner) [name], MD (Inactive) as Consulting Physician (Neurology)  Extended Emergency Contact Information Primary Emergency Contact: [name],[name] Address: [address]          [place], [place] [address] [name] of [place] Phone: [phone] Relation: Sister Secondary Emergency Contact: [name] [place] of [place] Phone: [phone] Mobile Phone: [phone] Relation: Sister  Code Status:  DNR  Goals of care: Advanced Directive information    10/12/2021   11:55 AM  Advanced Directives  Does Patient Have a Medical Advance Directive? Yes  Type of Advance Directive Out of facility DNR (pink MOST or yellow form)  Does patient want to make changes to medical advance directive? No - Patient declined  Pre-existing out of facility DNR order (yellow form or pink MOST form) Pink MOST/Yellow Form most recent copy in chart - Physician notified to receive inpatient order     Chief Complaint  Patient presents with   Acute Visit    Patient is being seen for agitation and poor oral intake    HPI:  Pt is a 88 y.o. female seen today for medical management of chronic diseases.  ***   Past Medical History:  Diagnosis Date   Acute bronchitis 05/23/2011   Acute upper respiratory  infections of unspecified site 05/23/2011   Arthritis    Chronic airway obstruction, not elsewhere classified 05/23/2011   Disturbance of salivary secretion 01/31/2011   Dizziness and giddiness 01/31/2011   Dyspnea    Essential tremor 04/25/2014   External hemorrhoids without mention of complication 17/79/3903   Gait disorder 04/25/2014   Insomnia, unspecified 09/12/2011   Lumbago 01/31/2011   Major depressive disorder, single episode, unspecified 01/31/2011   Memory disorder 04/25/2014   Mitral valve disorders(424.0) 01/31/2011   Other and unspecified hyperlipidemia 01/31/2011   Other convulsions 01/31/2011   Other emphysema ([place]) 01/31/2011   Pain in joint, site unspecified 01/31/2011   Restless legs syndrome (RLS) 09/12/2011   Retinal detachment with retinal defect of right eye 2011   right eye twice   Seizure disorder ([place])    Senile osteoporosis 01/31/2011   Spontaneous ecchymoses 01/31/2011   Stiffness of joints, not elsewhere classified, multiple sites 01/31/2011   Unspecified essential hypertension 01/31/2011   Past Surgical History:  Procedure Laterality Date   ABDOMINAL HYSTERECTOMY  06/21/2003   TAH/BSO, omenectomy PSB resect, Stg IC cystadenofibroma   CHOLECYSTECTOMY  2005   Dr. Marlou Starks   ELBOW SURGERY Right 2008   broken   Dr. Apolonio Schneiders   EYE SURGERY     INTRAMEDULLARY (IM) NAIL INTERTROCHANTERIC Left 08/30/2021   Procedure: INTRAMEDULLARY (IM) NAIL INTERTROCHANTRIC;  Surgeon: Rod Can, MD;  Location: [facility];  Service: Orthopedics;  Laterality: Left;   ORIF PATELLA Left 05/02/2020   Procedure: OPEN REDUCTION INTERNAL (ORIF) FIXATION LEFT PATELLA WITH MEDIAL AND LATERAL LIGAMENT REINFORCEMENTS;  Surgeon: [name], MD;  Location:  [facility];  Service: Orthopedics;  Laterality: Left;   ORIF PATELLA Left 05/30/2020   Procedure: OPEN REDUCTION INTERNAL (ORIF) FIXATION PATELLA;  Surgeon: [name], MD;  [facility];  Service: Orthopedics;  Laterality: Left;    RETINAL DETACHMENT SURGERY N/A    two   REVERSE SHOULDER ARTHROPLASTY Left 05/06/2019   Procedure: REVERSE SHOULDER ARTHROPLASTY;  Surgeon: Justice Britain, MD;  Location[facility];  Service: Orthopedics;  Laterality: Left;  12mn   ROTATOR CUFF REPAIR Right 2012   Dr. CTheda Sers  SQUAMOUS CELL CARCINOMA EXCISION Bilateral 2012, 8/14   [place]ohns on legs   Dr. GSarajane Jews  TONSILLECTOMY  1941   VIDEO BRONCHOSCOPY WITH ENDOBRONCHIAL NAVIGATION N/A 11/29/2015   Procedure: VIDEO BRONCHOSCOPY WITH ENDOBRONCHIAL NAVIGATION;  Surgeon: RCollene Gobble MD;  [place] [facility]  Service: Thoracic;  Laterality: N/A;    Allergies  Allergen Reactions   Vioxx [Rofecoxib] Shortness Of Breath   Dyazide [Hydrochlorothiazide W-Triamterene] Other (See Comments)    Lowers blood pressure too much   Latex Other (See Comments)    Swelling   Sulfa Antibiotics Nausea And Vomiting   Sumycin [Tetracycline]     Can't take due to drug reaction    Outpatient Encounter Medications as of 10/12/2021  Medication Sig   acetaminophen (TYLENOL) 500 MG tablet Take 1,000 mg by mouth every 6 (six) hours as needed (pain).   Albuterol Sulfate (PROAIR RESPICLICK) 1408(90 Base) MCG/ACT AEPB Inhale 2 puffs into the lungs daily as needed. Every AM PRN. Patient can keep it with her in room   aspirin (ASPIRIN CHILDRENS) 81 MG chewable tablet Chew 1 tablet (81 mg total) by mouth 2 (two) times daily with a meal.   cholecalciferol (VITAMIN D) 1000 units tablet Take 1 tablet (1,000 Units total) by mouth daily.   hydrocortisone 2.5 % lotion thin layer; topical,Twice A Day - PRN Special Instructions: apply to face and ears [DX: Psoriasis, unspecified]   hydrocortisone cream 1 % Apply 1 application. topically 2 (two) times daily as needed (psoriasis).   ketoconazole (NIZORAL) 2 % cream Apply 1 application. topically daily as needed.   ketoconazole (NIZORAL) 2 % shampoo Apply 1 application. topically See admin instructions. Monday, Thursday    lamoTRIgine (LAMICTAL) 150 MG tablet Take 150 mg by mouth 2 (two) times daily.   levETIRAcetam (KEPPRA) 250 MG tablet Take 250 mg by mouth daily. In the morning   levETIRAcetam (KEPPRA) 500 MG tablet Take 500 mg by mouth at bedtime.   memantine (NAMENDA) 10 MG tablet TAKE 1 TABLET BY MOUTH TWICE DAILY.   mirtazapine (REMERON) 15 MG tablet Take 1 tablet (15 mg total) by mouth at bedtime.   nystatin (MYCOSTATIN/NYSTOP) powder Apply 1 application. topically 2 (two) times daily as needed (Apply to urogenital area).   OxyCODONE HCl, Abuse Deter, (OXAYDO) 5 MG TABA Take 5 mg by mouth every 6 (six) hours as needed.  Nondisplaced transverse fracture of left patella, subsequent encounter for closed fracture with routine healing   pantoprazole (PROTONIX) 40 MG tablet Take 40 mg by mouth daily.   pramipexole (MIRAPEX) 0.25 MG tablet Take 0.5 mg by mouth at bedtime.   sennosides-docusate sodium (SENOKOT-S) 8.6-50 MG tablet Take 2 tablets by mouth at bedtime.   simvastatin (ZOCOR) 10 MG tablet Take 10 mg by mouth at bedtime.   tiotropium (SPIRIVA HANDIHALER) 18 MCG inhalation capsule INHALE CONTENTS OF ONE CAPSULE ONCE DAILY FOR COPD.   triamcinolone (KENALOG) 0.1 % Apply 1 application. topically daily as  needed (psoriasis).   No facility-administered encounter medications on file as of 10/12/2021.    Review of Systems ***    Immunization History  Administered Date(s) Administered   Influenza Split 01/06/2014, 01/15/2017, 01/08/2018, 12/09/2018   Influenza Whole 01/07/2012, 01/06/2013   Influenza, High Dose Seasonal PF 12/25/2015, 01/20/2017   Influenza,inj,Quad PF,6+ Mos 12/21/2014   Influenza-Unspecified 12/09/2018, 01/18/2020, 01/24/2021   Moderna SARS-COV2 Booster Vaccination 04/02/2021   Moderna Sars-Covid-2 Vaccination 04/12/2019, 06/05/2019, 02/15/2020, 09/05/2020   PFIZER(Purple Top)SARS-COV-2 Vaccination 12/26/2020   Pneumococcal Conjugate-13 02/01/2014   Pneumococcal Polysaccharide-23  12/20/1992, 01/15/2000, 04/08/2004, 06/08/2004   Td 04/08/2002, 04/22/2002   Tdap 04/09/2011, 08/17/2015   Zoster Recombinat (Shingrix) 05/29/2005, 08/27/2017   Zoster, Live 04/08/2008, 05/29/2014, 05/28/2017   Zoster, Unspecified 05/29/2005   Pertinent  Health Maintenance Due  Topic Date Due   INFLUENZA VACCINE  11/06/2021   DEXA SCAN  Completed   MAMMOGRAM  Discontinued      09/06/2021    8:00 AM 09/06/2021    7:45 PM 09/07/2021    9:00 AM 09/07/2021    9:30 PM 09/08/2021   12:40 PM  Fall Risk  Patient Fall Risk Level High fall risk High fall risk High fall risk High fall risk High fall risk     Vitals:   10/12/21 1154  BP: (!) 147/89  Pulse: 100  Resp: 16  Temp: (!) 97.1 F (36.2 C)  SpO2: 91%  Weight: 153 lb 8 oz (69.6 kg)  Height: '5\' 2"'$  (1.575 m)   Body mass index is 28.08 kg/m.  Physical Exam     Labs reviewed: Recent Labs    09/01/21 0307 09/02/21 0359 09/04/21 1308 09/06/21 0449 09/11/21 0000  NA 139 138 144  --  142  K 3.3* 3.2* 3.9  --  4.3  CL 106 104 108  --  109*  CO2 '28 27 26  '$ --  26*  GLUCOSE 99 98 102*  --   --   BUN 23 21 31*  --  23*  CREATININE 0.83 0.78 0.88 0.78 0.6  CALCIUM 8.7* 8.5* 8.8*  --  8.7  MG  --  2.2  --   --   --    Recent Labs    05/22/21 0000  AST 16  ALT 12  ALBUMIN 4.1   Recent Labs    05/22/21 0000 08/29/21 0930 08/30/21 0323 09/01/21 0307 09/02/21 0359 09/04/21 1308 09/11/21 0000  WBC 3.9 3.9*   < > 6.5 7.8 7.7 6.3  NEUTROABS 1,962.00 2.3  --   --   --   --  4,416.00  HGB 11.5* 12.6   < > 9.4* 9.4* 10.0* 10.0*  HCT 36 39.9   < > 31.1* 30.6* 32.5* 32*  MCV  --  84.2   < > 86.1 85.2 86.4  --   PLT 220 218   < > 163 190 239 422*   < > = values in this interval not displayed.   Lab Results  Component Value Date   TSH 1.89 05/22/2021   No results found for: "HGBA1C" Lab Results  Component Value Date   CHOL 169 11/23/2020   HDL 64 11/23/2020   LDLCALC 88 11/23/2020   TRIG 80 11/23/2020   CHOLHDL 3.6  06/27/2015    Significant Diagnostic Results in last 30 days:  No results found.  Assessment/Plan ***   Family/ staff Communication: Discussed plan of care with resident and charge nurse  Labs/tests ordered:     Durenda Age, DNP,  MSN, FNP-BC [place] [phone] (Monday-Friday 8:00 a.m. - 5:00 p.m.) [phone] (after hours)

## 2021-10-15 DIAGNOSIS — S72142D Displaced intertrochanteric fracture of left femur, subsequent encounter for closed fracture with routine healing: Secondary | ICD-10-CM | POA: Diagnosis not present

## 2021-10-16 ENCOUNTER — Encounter: Payer: Self-pay | Admitting: Internal Medicine

## 2021-10-16 ENCOUNTER — Non-Acute Institutional Stay (SKILLED_NURSING_FACILITY): Payer: Medicare PPO | Admitting: Internal Medicine

## 2021-10-16 DIAGNOSIS — L89152 Pressure ulcer of sacral region, stage 2: Secondary | ICD-10-CM | POA: Diagnosis not present

## 2021-10-16 DIAGNOSIS — F32A Depression, unspecified: Secondary | ICD-10-CM

## 2021-10-16 DIAGNOSIS — S72002S Fracture of unspecified part of neck of left femur, sequela: Secondary | ICD-10-CM

## 2021-10-16 NOTE — Progress Notes (Signed)
Location:   [facility] Room Number: 29 Place of Service:  SNF [phone]) Provider:  Veleta Miners MD  [name], MD  Patient Care Team: [name], MD as PCP - General (Internal Medicine) [name], MD as Consulting Physician (Ophthalmology) [name], MD as Consulting Physician (Cardiology) [name], MD as Consulting Physician (Dermatology) [name], MD as Consulting Physician (Orthopedic Surgery) [name], MD as Consulting Physician (Orthopedic Surgery) [name], MD as Consulting Physician (Orthopedic Surgery) Solis, [place], [place] X, NP as Nurse Practitioner (Nurse Practitioner) [name], MD (Inactive) as Consulting Physician (Neurology)  Extended Emergency Contact Information Primary Emergency Contact: [name],[name] Address: [address]          [place], [place] [address] [name] of [place] Phone: [phone] Relation: Sister Secondary Emergency Contact: [name] [place] of [place] Phone: [phone] Mobile Phone: [phone] Relation: Sister  Code Status:  DNR Managed Care Goals of care: Advanced Directive information    10/16/2021    3:34 PM  Advanced Directives  Does Patient Have a Medical Advance Directive? Yes  Type of Advance Directive Out of facility DNR (pink MOST or yellow form)  Does patient want to make changes to medical advance directive? No - Patient declined  Pre-existing out of facility DNR order (yellow form or pink MOST form) Pink MOST/Yellow Form most recent copy in chart - Physician notified to receive inpatient order     Chief Complaint  Patient presents with   Acute Visit    Depression    HPI:  Pt is a 88 y.o. 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   Mon[place]  had started her on Remeron but patient started having more confusion and hallucinations.  Her Remeron was stopped few days ago.   Patient went to see orthopedics yesterday and per Dr. Lyla Glassing note her healing is complicated by worsening of her varus deformity at the base of the femoral neck.  She has been touchdown weightbearing for past 4 weeks.   He wants to continue toe-touch weightbearing and if no improvement in 6 weeks to consider arthroplasty. Patient is very upset about this.   She states that she does not want to go to surgery and would rather be wheelchair bound for now.   She is snapping at other nurses refusing care.  She is telling them that she wants to leave friends home.  She was very tearful with me today.   Poor Appetite lost 10 lbs Sleeping well at night Past Medical History:  Diagnosis Date   Acute bronchitis 05/23/2011   Acute upper respiratory infections of unspecified site 05/23/2011   Arthritis    Chronic airway obstruction, not elsewhere classified 05/23/2011   Disturbance of salivary secretion 01/31/2011   Dizziness and giddiness 01/31/2011   Dyspnea    Essential tremor 04/25/2014   External hemorrhoids without mention of complication 59/16/3846   Gait disorder 04/25/2014  Insomnia, unspecified 09/12/2011   Lumbago 01/31/2011   Major depressive disorder, single episode, unspecified 01/31/2011   Memory disorder 04/25/2014   Mitral valve disorders(424.0) 01/31/2011   Other and unspecified hyperlipidemia 01/31/2011   Other convulsions 01/31/2011   Other emphysema ([place]) 01/31/2011   Pain in joint, site unspecified  01/31/2011   Restless legs syndrome (RLS) 09/12/2011   Retinal detachment with retinal defect of right eye 2011   right eye twice   Seizure disorder ([place])    Senile osteoporosis 01/31/2011   Spontaneous ecchymoses 01/31/2011   Stiffness of joints, not elsewhere classified, multiple sites 01/31/2011   Unspecified essential hypertension 01/31/2011   Past Surgical History:  Procedure Laterality Date   ABDOMINAL HYSTERECTOMY  06/21/2003   TAH/BSO, omenectomy PSB resect, Stg IC cystadenofibroma   CHOLECYSTECTOMY  2005   Dr. Marlou Starks   ELBOW SURGERY Right 2008   broken   Dr. Apolonio Schneiders   EYE SURGERY     INTRAMEDULLARY (IM) NAIL INTERTROCHANTERIC Left 08/30/2021   Procedure: INTRAMEDULLARY (IM) NAIL INTERTROCHANTRIC;  Surgeon: Rod Can, MD;  Location: [facility];  Service: Orthopedics;  Laterality: Left;   ORIF PATELLA Left 05/02/2020   Procedure: OPEN REDUCTION INTERNAL (ORIF) FIXATION LEFT PATELLA WITH MEDIAL AND LATERAL LIGAMENT REINFORCEMENTS;  Surgeon: [name], MD;  Location: [facility];  Service: Orthopedics;  Laterality: Left;   ORIF PATELLA Left 05/30/2020   Procedure: OPEN REDUCTION INTERNAL (ORIF) FIXATION PATELLA;  Surgeon: [name], MD;  Location: [facility];  Service: Orthopedics;  Laterality: Left;   RETINAL DETACHMENT SURGERY N/A    two   REVERSE SHOULDER ARTHROPLASTY Left 05/06/2019   Procedure: REVERSE SHOULDER ARTHROPLASTY;  Surgeon: Justice Britain, MD;  Location: [facility];  Service: Orthopedics;  Laterality: Left;  128mn   ROTATOR CUFF REPAIR Right 2012   Dr. CTheda Sers  SQUAMOUS CELL CARCINOMA EXCISION Bilateral 2012, 8/14   [place] on legs   Dr. GSarajane Jews  TONSILLECTOMY  1941   VIDEO BRONCHOSCOPY WITH ENDOBRONCHIAL NAVIGATION N/A 11/29/2015   Procedure: VIDEO BRONCHOSCOPY WITH ENDOBRONCHIAL NAVIGATION;  Surgeon: RCollene Gobble MD;  Location: [facility]  Service: Thoracic;  Laterality: N/A;    Allergies  Allergen Reactions   Vioxx [Rofecoxib] Shortness Of Breath   Dyazide  [Hydrochlorothiazide W-Triamterene] Other (See Comments)    Lowers blood pressure too much   Latex Other (See Comments)    Swelling   Sulfa Antibiotics Nausea And Vomiting   Sumycin [Tetracycline]     Can't take due to drug reaction    Allergies as of 10/16/2021       Reactions   Vioxx [rofecoxib] Shortness Of Breath   Dyazide [hydrochlorothiazide W-triamterene] Other (See Comments)   Lowers blood pressure too much   Latex Other (See Comments)   Swelling   Sulfa Antibiotics Nausea And Vomiting   Sumycin [tetracycline]    Can't take due to drug reaction        Medication List        Accurate as of October 16, 2021  3:34 PM. If you have any questions, ask your nurse or doctor.          STOP taking these medications    mirtazapine 15 MG tablet Commonly known as: Remeron Stopped by: AVirgie Dad MD       TAKE these medications    acetaminophen 500 MG tablet Commonly known as: TYLENOL Take 1,000 mg by mouth every 6 (six) hours as needed (pain).   cholecalciferol 1000 units tablet Commonly known  as: VITAMIN D Take 1 tablet (1,000 Units total) by mouth daily.   hydrocortisone cream 1 % Apply 1 application. topically 2 (two) times daily as needed (psoriasis).   hydrocortisone 2.5 % lotion thin layer; topical,Twice A Day - PRN Special Instructions: apply to face and ears [DX: Psoriasis, unspecified]   ketoconazole 2 % shampoo Commonly known as: NIZORAL Apply 1 application. topically See admin instructions. Monday, Thursday   ketoconazole 2 % cream Commonly known as: NIZORAL Apply 1 application. topically daily as needed.   lamoTRIgine 150 MG tablet Commonly known as: LAMICTAL Take 150 mg by mouth 2 (two) times daily.   levETIRAcetam 500 MG tablet Commonly known as: KEPPRA Take 500 mg by mouth at bedtime.   levETIRAcetam 250 MG tablet Commonly known as: KEPPRA Take 250 mg by mouth daily. In the morning   memantine 10 MG tablet Commonly known as:  NAMENDA TAKE 1 TABLET BY MOUTH TWICE DAILY.   nystatin powder Commonly known as: MYCOSTATIN/NYSTOP Apply 1 application. topically 2 (two) times daily as needed (Apply to urogenital area).   OxyCODONE HCl (Abuse Deter) 5 MG Taba Commonly known as: OXAYDO Take 5 mg by mouth every 6 (six) hours as needed.  Nondisplaced transverse fracture of left patella, subsequent encounter for closed fracture with routine healing   pantoprazole 40 MG tablet Commonly known as: PROTONIX Take 40 mg by mouth daily.   pramipexole 0.25 MG tablet Commonly known as: MIRAPEX Take 0.5 mg by mouth at bedtime.   ProAir RespiClick 720 (90 Base) MCG/ACT Aepb Generic drug: Albuterol Sulfate Inhale 2 puffs into the lungs daily as needed. Every AM PRN. Patient can keep it with her in room   sennosides-docusate sodium 8.6-50 MG tablet Commonly known as: SENOKOT-S Take 2 tablets by mouth at bedtime.   simvastatin 10 MG tablet Commonly known as: ZOCOR Take 10 mg by mouth at bedtime.   tiotropium 18 MCG inhalation capsule Commonly known as: Spiriva HandiHaler INHALE CONTENTS OF ONE CAPSULE ONCE DAILY FOR COPD.   triamcinolone cream 0.1 % Commonly known as: KENALOG Apply 1 application. topically daily as needed (psoriasis).        Review of Systems  Constitutional:  Positive for activity change, appetite change and unexpected weight change.  HENT: Negative.    Respiratory:  Negative for cough and shortness of breath.   Cardiovascular:  Negative for leg swelling.  Gastrointestinal:  Negative for constipation.  Genitourinary: Negative.   Musculoskeletal:  Positive for gait problem. Negative for arthralgias and myalgias.  Skin: Negative.   Neurological:  Positive for weakness. Negative for dizziness.  Psychiatric/Behavioral:  Positive for confusion and dysphoric mood. Negative for sleep disturbance.     Immunization History  Administered Date(s) Administered   Influenza Split 01/06/2014, 01/15/2017,  01/08/2018, 12/09/2018   Influenza Whole 01/07/2012, 01/06/2013   Influenza, High Dose Seasonal PF 12/25/2015, 01/20/2017   Influenza,inj,Quad PF,6+ Mos 12/21/2014   Influenza-Unspecified 12/09/2018, 01/18/2020, 01/24/2021   Moderna SARS-COV2 Booster Vaccination 04/02/2021   Moderna Sars-Covid-2 Vaccination 04/12/2019, 06/05/2019, 02/15/2020, 09/05/2020   PFIZER(Purple Top)SARS-COV-2 Vaccination 12/26/2020   Pneumococcal Conjugate-13 02/01/2014   Pneumococcal Polysaccharide-23 12/20/1992, 01/15/2000, 04/08/2004, 06/08/2004   Td 04/08/2002, 04/22/2002   Tdap 04/09/2011, 08/17/2015   Zoster Recombinat (Shingrix) 05/29/2005, 08/27/2017   Zoster, Live 04/08/2008, 05/29/2014, 05/28/2017   Zoster, Unspecified 05/29/2005   Pertinent  Health Maintenance Due  Topic Date Due   INFLUENZA VACCINE  11/06/2021   DEXA SCAN  Completed   MAMMOGRAM  Discontinued      09/06/2021  8:00 AM 09/06/2021    7:45 PM 09/07/2021    9:00 AM 09/07/2021    9:30 PM 09/08/2021   12:40 PM  Fall Risk  Patient Fall Risk Level High fall risk High fall risk High fall risk High fall risk High fall risk   Functional Status Survey:    Vitals:   10/16/21 1521  BP: 136/82  Pulse: 70  Resp: 16  Temp: (!) 96.5 F (35.8 C)  SpO2: 92%  Weight: 153 lb 8 oz (69.6 kg)  Height: '5\' 2"'$  (1.575 m)   Body mass index is 28.08 kg/m. Physical Exam Vitals reviewed.  Constitutional:      Appearance: Normal appearance.  HENT:     Head: Normocephalic.     Nose: Nose normal.     Mouth/Throat:     Mouth: Mucous membranes are moist.     Pharynx: Oropharynx is clear.  Eyes:     Pupils: Pupils are equal, round, and reactive to light.  Cardiovascular:     Rate and Rhythm: Normal rate and regular rhythm.     Pulses: Normal pulses.     Heart sounds: Normal heart sounds. No murmur heard. Pulmonary:     Effort: Pulmonary effort is normal.     Breath sounds: Normal breath sounds.  Abdominal:     General: Abdomen is flat. Bowel  sounds are normal.     Palpations: Abdomen is soft.  Musculoskeletal:     Cervical back: Neck supple.  Skin:    General: Skin is warm.  Neurological:     Mental Status: She is alert and oriented to person, place, and time.  Psychiatric:        Mood and Affect: Mood normal.        Thought Content: Thought content normal.     Labs reviewed: Recent Labs    09/01/21 0307 09/02/21 0359 09/04/21 1308 09/06/21 0449 09/11/21 0000 09/27/21 0000 10/12/21 0000  NA 139 138 144  --  142 146 145  K 3.3* 3.2* 3.9  --  4.3 3.8 3.7  CL 106 104 108  --  109* 109* 107  CO2 '28 27 26  '$ --  26* 30* 29*  GLUCOSE 99 98 102*  --   --   --   --   BUN 23 21 31*  --  23* 20 17  CREATININE 0.83 0.78 0.88   < > 0.6 0.8 0.9  CALCIUM 8.7* 8.5* 8.8*  --  8.7 9.1 9.4  MG  --  2.2  --   --   --   --   --    < > = values in this interval not displayed.   Recent Labs    05/22/21 0000 10/12/21 0000  AST 16 18  ALT 12 9  ALKPHOS  --  224*  ALBUMIN 4.1 4.0   Recent Labs    08/29/21 0930 08/30/21 0323 09/01/21 0307 09/02/21 0359 09/04/21 1308 09/11/21 0000 10/12/21 0000  WBC 3.9*   < > 6.5 7.8 7.7 6.3 4.7  NEUTROABS 2.3  --   --   --   --  4,416.00 2,712.00  HGB 12.6   < > 9.4* 9.4* 10.0* 10.0* 12.7  HCT 39.9   < > 31.1* 30.6* 32.5* 32* 40  MCV 84.2   < > 86.1 85.2 86.4  --   --   PLT 218   < > 163 190 239 422* 363   < > = values in this interval not displayed.  Lab Results  Component Value Date   TSH 1.89 05/22/2021   No results found for: "HGBA1C" Lab Results  Component Value Date   CHOL 147 09/27/2021   HDL 48 09/27/2021   LDLCALC 78 09/27/2021   TRIG 131 09/27/2021   CHOLHDL 3.6 06/27/2015    Significant Diagnostic Results in last 30 days:  No results found.  Assessment/Plan Acute depression Did not tolerate Remeron Will try Lexapro 5 mg for 1 week And then 10 mg QD BMP in 1 week to follow Sodium Patient agreeable with the plan Closed fracture of left hip, sequela Toe  Touch weight bearing for 6 weeks  If no Improvement possible arthroplasty Patient refusing and saying she does not want to go through any other surgeries Pain Controlled with Oxycodone Pressure injury of sacrum Per nurses almost healed  Other issues Seizure disorder ([facility]) Stable on Keprra and Lamictal   Restless legs syndrome (RLS) Mirapex QHS    Panlobular emphysema ([facility]) Doing well with Spiriva Proair PRn   Essential hypertension Off Norvasc for now   Gastroesophageal reflux disease, unspecified whether esophagitis present Continue Protonix   Senile dementia ([place]) Highly Functional On Namenda    Mixed hyperlipidemia On simvastatin     Bilateral lower extremity edema Off Lasix . Will uses PRn      Senile osteoporosis Was on Fosmax took herself of it and does not want to take anything anymore  Family/ staff Communication:   Labs/tests ordered:

## 2021-10-18 ENCOUNTER — Other Ambulatory Visit: Payer: Self-pay | Admitting: Adult Health

## 2021-10-18 ENCOUNTER — Non-Acute Institutional Stay (SKILLED_NURSING_FACILITY): Payer: Medicare PPO | Admitting: Adult Health

## 2021-10-18 ENCOUNTER — Encounter: Payer: Self-pay | Admitting: Adult Health

## 2021-10-18 ENCOUNTER — Other Ambulatory Visit: Payer: Self-pay | Admitting: Internal Medicine

## 2021-10-18 DIAGNOSIS — F333 Major depressive disorder, recurrent, severe with psychotic symptoms: Secondary | ICD-10-CM | POA: Diagnosis not present

## 2021-10-18 DIAGNOSIS — F419 Anxiety disorder, unspecified: Secondary | ICD-10-CM | POA: Diagnosis not present

## 2021-10-18 DIAGNOSIS — R410 Disorientation, unspecified: Secondary | ICD-10-CM

## 2021-10-18 MED ORDER — LORAZEPAM 2 MG/ML PO CONC: 0.5000 mg | Freq: Two times a day (BID) | ORAL | 0 refills | Status: DC | PRN

## 2021-10-18 MED ORDER — LORAZEPAM 2 MG/ML PO CONC: ORAL | 0 refills | Status: DC

## 2021-10-18 NOTE — Progress Notes (Signed)
Location:  [facility] Room Number: [place] of Service:  SNF (31) Provider:  Durenda Age, DNP, FNP-BC  Patient Care Team: [name], MD as PCP - General (Internal Medicine) [name], MD as Consulting Physician (Ophthalmology) [name], MD as Consulting Physician (Cardiology) [name], MD as Consulting Physician (Dermatology) [name], MD as Consulting Physician (Orthopedic Surgery) [name], MD as Consulting Physician (Orthopedic Surgery) [name], MD as Consulting Physician (Orthopedic Surgery) [place], Reeder, Man X, NP as Nurse Practitioner (Nurse Practitioner) [name], MD (Inactive) as Consulting Physician (Neurology)  Extended Emergency Contact Information Primary Emergency Contact: [name],[name] Address: [address]          [place], [place] [address] [name] of [place] Phone: [phone] Relation: Sister Secondary Emergency Contact: [name] [place] of [place] Phone: [phone] Mobile Phone: [phone] Relation: Sister  Code Status:  DNR  Goals of care: Advanced Directive information    10/18/2021    3:22 PM  Advanced Directives  Does Patient Have a Medical Advance Directive? Yes  Type of Advance Directive Out of facility DNR (pink MOST or yellow form)  Does patient want to make changes to medical advance directive? No - Patient declined  Pre-existing out of facility DNR order (yellow form or pink MOST form) Pink MOST/Yellow Form most recent copy in chart - Physician notified to receive inpatient order     Chief Complaint  Patient presents with   Acute Visit    Agitation     HPI:  Pt is a 88 y.o. female seen today for an acute visit for agitation. She was noted to be irritable, making harsh remarks to staff and paranoid. She refused medications because she thinks the medications given to her are not her  scheduled medications. She had a fall yesterday due to her wanting to use the bathroom and did not ask for help. She said that she slowly sat on the floor and did not fall. She takes Lexapro 10 mg daily for depression. Latest PHQ-9 score 7, ranging in mild depression. BIMS score 15/15, intact cognition.   Past Medical History:  Diagnosis Date   Acute bronchitis 05/23/2011   Acute upper respiratory infections of unspecified site 05/23/2011   Arthritis    Chronic airway obstruction, not elsewhere classified 05/23/2011   Disturbance of salivary secretion 01/31/2011   Dizziness and giddiness 01/31/2011   Dyspnea    Essential tremor 04/25/2014   External hemorrhoids without mention of complication 27/06/5007   Gait disorder 04/25/2014   Insomnia, unspecified 09/12/2011   Lumbago 01/31/2011   Major depressive disorder, single episode, unspecified 01/31/2011   Memory disorder 04/25/2014   Mitral valve disorders(424.0) 01/31/2011   Other and unspecified hyperlipidemia 01/31/2011   Other convulsions 01/31/2011   Other emphysema ([place]) 01/31/2011   Pain in joint, site unspecified 01/31/2011   Restless legs syndrome (RLS) 09/12/2011   Retinal detachment with retinal defect of right eye 2011   right eye twice   Seizure disorder ([place])    Senile osteoporosis 01/31/2011   Spontaneous ecchymoses 01/31/2011   Stiffness of joints, not elsewhere classified, multiple sites 01/31/2011   Unspecified essential hypertension 01/31/2011   Past Surgical History:  Procedure Laterality Date   ABDOMINAL HYSTERECTOMY  06/21/2003   TAH/BSO, omenectomy PSB resect, Stg IC cystadenofibroma   CHOLECYSTECTOMY  2005   Dr. Marlou Starks   ELBOW SURGERY Right 2008  broken   Dr. Apolonio Schneiders   EYE SURGERY     INTRAMEDULLARY (IM) NAIL INTERTROCHANTERIC Left 08/30/2021   Procedure: INTRAMEDULLARY (IM) NAIL INTERTROCHANTRIC;  Surgeon: Rod Can, MD;  Location: [facility];  Service: Orthopedics;  Laterality: Left;   ORIF PATELLA Left  05/02/2020   Procedure: OPEN REDUCTION INTERNAL (ORIF) FIXATION LEFT PATELLA WITH MEDIAL AND LATERAL LIGAMENT REINFORCEMENTS;  Surgeon: [name], MD;  Location: [facility];  Service: Orthopedics;  Laterality: Left;   ORIF PATELLA Left 05/30/2020   Procedure: OPEN REDUCTION INTERNAL (ORIF) FIXATION PATELLA;  Surgeon: [name], MD;  Location: [facility];  Service: Orthopedics;  Laterality: Left;   RETINAL DETACHMENT SURGERY N/A    two   REVERSE SHOULDER ARTHROPLASTY Left 05/06/2019   Procedure: REVERSE SHOULDER ARTHROPLASTY;  Surgeon: Justice Britain, MD;  Location: [facility];  Service: Orthopedics;  Laterality: Left;  140mn   ROTATOR CUFF REPAIR Right 2012   Dr. CTheda Sers  SQUAMOUS CELL CARCINOMA EXCISION Bilateral 2012, 8/14   [place] on legs   Dr. GSarajane Jews  TONSILLECTOMY  1941   VIDEO BRONCHOSCOPY WITH ENDOBRONCHIAL NAVIGATION N/A 11/29/2015   Procedure: VIDEO BRONCHOSCOPY WITH ENDOBRONCHIAL NAVIGATION;  Surgeon: RCollene Gobble MD;  [facility]  Service: Thoracic;  Laterality: N/A;    Allergies  Allergen Reactions   Vioxx [Rofecoxib] Shortness Of Breath   Dyazide [Hydrochlorothiazide W-Triamterene] Other (See Comments)    Lowers blood pressure too much   Latex Other (See Comments)    Swelling   Sulfa Antibiotics Nausea And Vomiting   Sumycin [Tetracycline]     Can't take due to drug reaction    Outpatient Encounter Medications as of 10/18/2021  Medication Sig   acetaminophen (TYLENOL) 500 MG tablet Take 1,000 mg by mouth every 6 (six) hours as needed (pain).   Albuterol Sulfate (PROAIR RESPICLICK) 1673(90 Base) MCG/ACT AEPB Inhale 2 puffs into the lungs daily as needed. Every AM PRN. Patient can keep it with her in room   cholecalciferol (VITAMIN D) 1000 units tablet Take 1 tablet (1,000 Units total) by mouth daily.   escitalopram (LEXAPRO) 5 MG tablet Take 5 mg by mouth daily.   hydrocortisone 2.5 % lotion thin layer; topical,Twice A Day - PRN Special Instructions: apply to  face and ears [DX: Psoriasis, unspecified]   hydrocortisone cream 1 % Apply 1 application. topically 2 (two) times daily as needed (psoriasis).   ketoconazole (NIZORAL) 2 % cream Apply 1 application. topically daily as needed.   ketoconazole (NIZORAL) 2 % shampoo Apply 1 application. topically See admin instructions. Monday, Thursday   lamoTRIgine (LAMICTAL) 150 MG tablet Take 150 mg by mouth 2 (two) times daily.   levETIRAcetam (KEPPRA) 250 MG tablet Take 250 mg by mouth daily. In the morning   levETIRAcetam (KEPPRA) 500 MG tablet Take 500 mg by mouth at bedtime.   LORazepam (LORAZEPAM INTENSOL) 2 MG/ML concentrated solution 0.6 mg/ml gel apply to skin BID PRN   memantine (NAMENDA) 10 MG tablet TAKE 1 TABLET BY MOUTH TWICE DAILY.   nystatin (MYCOSTATIN/NYSTOP) powder Apply 1 application. topically 2 (two) times daily as needed (Apply to urogenital area).   OxyCODONE HCl, Abuse Deter, (OXAYDO) 5 MG TABA Take 5 mg by mouth every 6 (six) hours as needed.  Nondisplaced transverse fracture of left patella, subsequent encounter for closed fracture with routine healing   pantoprazole (PROTONIX) 40 MG tablet Take 40 mg by mouth daily.   pramipexole (MIRAPEX) 0.25 MG tablet Take 0.5 mg by mouth at  bedtime.   risperiDONE (RISPERDAL) 0.5 MG tablet Take 0.5 mg by mouth 2 (two) times daily.   sennosides-docusate sodium (SENOKOT-S) 8.6-50 MG tablet Take 2 tablets by mouth at bedtime.   simvastatin (ZOCOR) 10 MG tablet Take 10 mg by mouth at bedtime.   tiotropium (SPIRIVA HANDIHALER) 18 MCG inhalation capsule INHALE CONTENTS OF ONE CAPSULE ONCE DAILY FOR COPD.   triamcinolone (KENALOG) 0.1 % Apply 1 application. topically daily as needed (psoriasis).   No facility-administered encounter medications on file as of 10/18/2021.    Review of Systems  Constitutional:  Negative for appetite change, chills, fatigue and fever.  HENT:  Negative for congestion, hearing loss, rhinorrhea and sore throat.   Eyes:  Negative.   Respiratory:  Negative for cough, shortness of breath and wheezing.   Cardiovascular:  Negative for chest pain, palpitations and leg swelling.  Gastrointestinal:  Negative for abdominal pain, constipation, diarrhea, nausea and vomiting.  Genitourinary:  Negative for dysuria.  Musculoskeletal:  Negative for arthralgias, back pain and myalgias.  Skin:  Negative for color change, rash and wound.  Neurological:  Negative for dizziness, weakness and headaches.  Psychiatric/Behavioral:  Positive for agitation and behavioral problems. The patient is nervous/anxious.      Immunization History  Administered Date(s) Administered   Influenza Split 01/06/2014, 01/15/2017, 01/08/2018, 12/09/2018   Influenza Whole 01/07/2012, 01/06/2013   Influenza, High Dose Seasonal PF 12/25/2015, 01/20/2017   Influenza,inj,Quad PF,6+ Mos 12/21/2014   Influenza-Unspecified 12/09/2018, 01/18/2020, 01/24/2021   Moderna SARS-COV2 Booster Vaccination 04/02/2021   Moderna Sars-Covid-2 Vaccination 04/12/2019, 06/05/2019, 02/15/2020, 09/05/2020   PFIZER(Purple Top)SARS-COV-2 Vaccination 12/26/2020   Pneumococcal Conjugate-13 02/01/2014   Pneumococcal Polysaccharide-23 12/20/1992, 01/15/2000, 04/08/2004, 06/08/2004   Td 04/08/2002, 04/22/2002   Tdap 04/09/2011, 08/17/2015   Zoster Recombinat (Shingrix) 05/29/2005, 08/27/2017   Zoster, Live 04/08/2008, 05/29/2014, 05/28/2017   Zoster, Unspecified 05/29/2005   Pertinent  Health Maintenance Due  Topic Date Due   INFLUENZA VACCINE  11/06/2021   DEXA SCAN  Completed   MAMMOGRAM  Discontinued      09/06/2021    8:00 AM 09/06/2021    7:45 PM 09/07/2021    9:00 AM 09/07/2021    9:30 PM 09/08/2021   12:40 PM  Fall Risk  Patient Fall Risk Level High fall risk High fall risk High fall risk High fall risk High fall risk     Vitals:   10/18/21 1509  BP: 129/90  Pulse: 96  Resp: 16  Temp: (!) 97.3 F (36.3 C)  SpO2: 93%  Weight: 152 lb 4.8 oz (69.1 kg)   Height: '5\' 2"'$  (1.575 m)   Body mass index is 27.86 kg/m.  Physical Exam Constitutional:      General: She is not in acute distress.    Appearance: Normal appearance.  HENT:     Head: Normocephalic and atraumatic.     Nose: Nose normal.     Mouth/Throat:     Mouth: Mucous membranes are moist.  Eyes:     Conjunctiva/sclera: Conjunctivae normal.  Cardiovascular:     Rate and Rhythm: Normal rate and regular rhythm.  Pulmonary:     Effort: Pulmonary effort is normal.     Breath sounds: Normal breath sounds.  Abdominal:     General: Bowel sounds are normal.     Palpations: Abdomen is soft.  Musculoskeletal:        General: Normal range of motion.     Cervical back: Normal range of motion.  Skin:    General: Skin is  warm and dry.  Neurological:     General: No focal deficit present.     Mental Status: She is alert and oriented to person, place, and time.  Psychiatric:        Mood and Affect: Mood normal.        Behavior: Behavior normal.        Labs reviewed: Recent Labs    09/01/21 0307 09/02/21 0359 09/04/21 1308 09/06/21 0449 09/11/21 0000 09/27/21 0000 10/12/21 0000  NA 139 138 144  --  142 146 145  K 3.3* 3.2* 3.9  --  4.3 3.8 3.7  CL 106 104 108  --  109* 109* 107  CO2 '28 27 26  '$ --  26* 30* 29*  GLUCOSE 99 98 102*  --   --   --   --   BUN 23 21 31*  --  23* 20 17  CREATININE 0.83 0.78 0.88   < > 0.6 0.8 0.9  CALCIUM 8.7* 8.5* 8.8*  --  8.7 9.1 9.4  MG  --  2.2  --   --   --   --   --    < > = values in this interval not displayed.   Recent Labs    05/22/21 0000 10/12/21 0000  AST 16 18  ALT 12 9  ALKPHOS  --  224*  ALBUMIN 4.1 4.0   Recent Labs    08/29/21 0930 08/30/21 0323 09/01/21 0307 09/02/21 0359 09/04/21 1308 09/11/21 0000 10/12/21 0000  WBC 3.9*   < > 6.5 7.8 7.7 6.3 4.7  NEUTROABS 2.3  --   --   --   --  4,416.00 2,712.00  HGB 12.6   < > 9.4* 9.4* 10.0* 10.0* 12.7  HCT 39.9   < > 31.1* 30.6* 32.5* 32* 40  MCV 84.2   < > 86.1  85.2 86.4  --   --   PLT 218   < > 163 190 239 422* 363   < > = values in this interval not displayed.   Lab Results  Component Value Date   TSH 1.89 05/22/2021   No results found for: "HGBA1C" Lab Results  Component Value Date   CHOL 147 09/27/2021   HDL 48 09/27/2021   LDLCALC 78 09/27/2021   TRIG 131 09/27/2021   CHOLHDL 3.6 06/27/2015    Significant Diagnostic Results in last 30 days:  No results found.  Assessment/Plan  1. Depression, major, recurrent, severe with psychosis ([place]) -   PHQ - 9 score 7, ranging in mild depression -  will start Risperdal 0.5 mg BID -  continue Lexapro 10 mg daily  2. Anxiety -  will start on Ativan 0.5 mg/ml gel topically BID PRN X 14 days    Family/ staff Communication: Discussed plan of care with resident and charge nurse.  Labs/tests ordered:   None    Durenda Age, DNP, MSN, FNP-BC [facility] [phone] (Monday-Friday 8:00 a.m. - 5:00 p.m.) [phone] (after hours)

## 2021-10-22 ENCOUNTER — Ambulatory Visit
Admission: RE | Admit: 2021-10-22 | Discharge: 2021-10-22 | Disposition: A | Discharge status: Home / Self Care | Payer: Medicare PPO | Source: Ambulatory Visit | Attending: Internal Medicine | Admitting: Internal Medicine

## 2021-10-22 DIAGNOSIS — R410 Disorientation, unspecified: Secondary | ICD-10-CM | POA: Diagnosis not present

## 2021-10-23 DIAGNOSIS — E785 Hyperlipidemia, unspecified: Secondary | ICD-10-CM | POA: Diagnosis not present

## 2021-10-23 DIAGNOSIS — I10 Essential (primary) hypertension: Secondary | ICD-10-CM | POA: Diagnosis not present

## 2021-10-23 LAB — BASIC METABOLIC PANEL
BUN: 20 (ref 4–21)
CO2: 30 — AB (ref 13–22)
Chloride: 107 (ref 99–108)
Creatinine: 0.9 (ref 0.5–1.1)
Glucose: 76
Potassium: 3.2 mEq/L — AB (ref 3.5–5.1)
Sodium: 144 (ref 137–147)

## 2021-10-23 LAB — COMPREHENSIVE METABOLIC PANEL
Calcium: 8.9 (ref 8.7–10.7)
eGFR: 67

## 2021-10-26 ENCOUNTER — Other Ambulatory Visit: Payer: Self-pay | Admitting: Orthopedic Surgery

## 2021-10-26 NOTE — Progress Notes (Signed)
Patient reports hand tremor since starting Risperdal. Will reduce dose to 0.25 mg BID.

## 2021-10-30 ENCOUNTER — Encounter: Payer: Self-pay | Admitting: Internal Medicine

## 2021-10-30 ENCOUNTER — Non-Acute Institutional Stay (SKILLED_NURSING_FACILITY): Payer: Medicare PPO | Admitting: Internal Medicine

## 2021-10-30 DIAGNOSIS — G40909 Epilepsy, unspecified, not intractable, without status epilepticus: Secondary | ICD-10-CM

## 2021-10-30 DIAGNOSIS — F333 Major depressive disorder, recurrent, severe with psychotic symptoms: Secondary | ICD-10-CM

## 2021-10-30 DIAGNOSIS — S72002S Fracture of unspecified part of neck of left femur, sequela: Secondary | ICD-10-CM | POA: Diagnosis not present

## 2021-10-30 NOTE — Progress Notes (Unsigned)
Location:   [facility] Room Number: 29 Place of Service:  SNF [phone]) Provider:  Veleta Miners MD   [name], MD  Patient Care Team: [name], MD as PCP - General (Internal Medicine) [name], MD as Consulting Physician (Ophthalmology) [name], MD as Consulting Physician (Cardiology) [name], MD as Consulting Physician (Dermatology) [name], MD as Consulting Physician (Orthopedic Surgery) [name], MD as Consulting Physician (Orthopedic Surgery) [name], MD as Consulting Physician (Orthopedic Surgery) Sherrill, Cowgill, Man X, NP as Nurse Practitioner (Nurse Practitioner) [name], MD (Inactive) as Consulting Physician (Neurology)  Extended Emergency Contact Information Primary Emergency Contact: [name],[name] Address: [address]          [place], [place] [address] [name] of [place] Phone: [phone] Relation: Sister Secondary Emergency Contact: [name] [place] of [place] Phone: [phone] Mobile Phone: [phone] Relation: Sister  Code Status:  DNR Managed Care Goals of care: Advanced Directive information    10/30/2021    2:43 PM  Advanced Directives  Does Patient Have a Medical Advance Directive? Yes  Type of Advance Directive Out of facility DNR (pink MOST or yellow form)  Does patient want to make changes to medical advance directive? No - Patient declined  Pre-existing out of facility DNR order (yellow form or pink MOST form) Pink MOST/Yellow Form most recent copy in chart - Physician notified to receive inpatient order     Chief Complaint  Patient presents with   Acute Visit    HPI:  [name] is a 88 y.o. female seen today for an acute visit for Behaviors and Paronoai   Patient lives in SNF in [place]   Patient has h/o COPD, Hypertension,Seizure Disorder,Mild Cognitive impairment, Hyperlipidemia Chronic  Venous Changes in LE  H/o Thyroid Nodules   Admitted in the hospital from 5/24-6/03 for Left Hip Fracture Underwent Intramedullary fixation  after a fall       per Dr. Lyla Glassing note her healing is complicated by worsening of her varus deformity at the base of the femoral neck.  She has been touchdown weightbearing for past 4 weeks.   He wants to continue toe-touch weightbearing and if no improvement in 6 weeks to consider arthroplasty.  Since then she acute episode of hallucinations and paranoia.   The CT scan of the head was negative.   Patient was very depressed crying snapping and other nurses refusing care.   She was started on Lexapro and Risperdal  all was added as she was refusing her meds.  But then patient started having some tremors and the dose of Risperdal was decreased.  Since the dose has been reduced she is having behavior issues again.   Today she told me that she thinks everybody hates her and wants her to die.  She also thinks that her outbursts are due to some neurological problem and she wants to see neurology.   Past Medical History:  Diagnosis Date   Acute bronchitis 05/23/2011   Acute upper respiratory infections of unspecified site 05/23/2011   Arthritis    Chronic airway obstruction, not elsewhere classified 05/23/2011   Disturbance of salivary secretion 01/31/2011   Dizziness and giddiness 01/31/2011   Dyspnea    Essential tremor 04/25/2014   External hemorrhoids without mention of complication 09/38/1829   Gait disorder 04/25/2014   Insomnia, unspecified 09/12/2011   Lumbago 01/31/2011  Major depressive disorder, single episode, unspecified 01/31/2011   Memory disorder 04/25/2014   Mitral valve disorders(424.0) 01/31/2011   Other and unspecified hyperlipidemia 01/31/2011   Other convulsions 01/31/2011   Other emphysema ([place]) 01/31/2011   Pain in joint, site unspecified 01/31/2011   Restless legs syndrome (RLS) 09/12/2011   Retinal detachment with retinal  defect of right eye 2011   right eye twice   Seizure disorder ([place])    Senile osteoporosis 01/31/2011   Spontaneous ecchymoses 01/31/2011   Stiffness of joints, not elsewhere classified, multiple sites 01/31/2011   Unspecified essential hypertension 01/31/2011   Past Surgical History:  Procedure Laterality Date   ABDOMINAL HYSTERECTOMY  06/21/2003   TAH/BSO, omenectomy PSB resect, Stg IC cystadenofibroma   CHOLECYSTECTOMY  2005   Dr. Marlou Starks   ELBOW SURGERY Right 2008   broken   Dr. Apolonio Schneiders   EYE SURGERY     INTRAMEDULLARY (IM) NAIL INTERTROCHANTERIC Left 08/30/2021   Procedure: INTRAMEDULLARY (IM) NAIL INTERTROCHANTRIC;  Surgeon: Rod Can, MD;  Location: [facility];  Service: Orthopedics;  Laterality: Left;   ORIF PATELLA Left 05/02/2020   Procedure: OPEN REDUCTION INTERNAL (ORIF) FIXATION LEFT PATELLA WITH MEDIAL AND LATERAL LIGAMENT REINFORCEMENTS;  Surgeon: [name], MD;  Location: [facility];  Service: Orthopedics;  Laterality: Left;   ORIF PATELLA Left 05/30/2020   Procedure: OPEN REDUCTION INTERNAL (ORIF) FIXATION PATELLA;  Surgeon: [name], MD;  Location: [facility];  Service: Orthopedics;  Laterality: Left;   RETINAL DETACHMENT SURGERY N/A    two   REVERSE SHOULDER ARTHROPLASTY Left 05/06/2019   Procedure: REVERSE SHOULDER ARTHROPLASTY;  Surgeon: Justice Britain, MD;  Location: [facility];  Service: Orthopedics;  Laterality: Left;  133mn   ROTATOR CUFF REPAIR Right 2012   Dr. CTheda Sers  SQUAMOUS CELL CARCINOMA EXCISION Bilateral 2012, 8/14   [place] on legs   Dr. GSarajane Jews  TONSILLECTOMY  1941   VIDEO BRONCHOSCOPY WITH ENDOBRONCHIAL NAVIGATION N/A 11/29/2015   Procedure: VIDEO BRONCHOSCOPY WITH ENDOBRONCHIAL NAVIGATION;  Surgeon: RCollene Gobble MD;  Location: [facility]  Service: Thoracic;  Laterality: N/A;    Allergies  Allergen Reactions   Vioxx [Rofecoxib] Shortness Of Breath   Dyazide [Hydrochlorothiazide W-Triamterene] Other (See Comments)    Lowers blood pressure too  much   Latex Other (See Comments)    Swelling   Sulfa Antibiotics Nausea And Vomiting   Sumycin [Tetracycline]     Can't take due to drug reaction    Allergies as of 10/30/2021       Reactions   Vioxx [rofecoxib] Shortness Of Breath   Dyazide [hydrochlorothiazide W-triamterene] Other (See Comments)   Lowers blood pressure too much   Latex Other (See Comments)   Swelling   Sulfa Antibiotics Nausea And Vomiting   Sumycin [tetracycline]    Can't take due to drug reaction        Medication List        Accurate as of October 30, 2021  2:43 PM. If you have any questions, ask your nurse or doctor.          acetaminophen 500 MG tablet Commonly known as: TYLENOL Take 1,000 mg by mouth every 6 (six) hours as needed (pain).   cholecalciferol 1000 units tablet Commonly known as: VITAMIN D Take 1 tablet (1,000 Units total) by mouth daily.   escitalopram 10 MG tablet Commonly known as: LEXAPRO Take 10 mg by mouth daily.   hydrocortisone cream 1 % Apply 1 application. topically 2 (two) times daily  as needed (psoriasis).   hydrocortisone 2.5 % lotion thin layer; topical,Twice A Day - PRN Special Instructions: apply to face and ears [DX: Psoriasis, unspecified]   ketoconazole 2 % shampoo Commonly known as: NIZORAL Apply 1 application. topically See admin instructions. Monday, Thursday   ketoconazole 2 % cream Commonly known as: NIZORAL Apply 1 application. topically daily as needed.   lamoTRIgine 150 MG tablet Commonly known as: LAMICTAL Take 150 mg by mouth 2 (two) times daily.   levETIRAcetam 500 MG tablet Commonly known as: KEPPRA Take 500 mg by mouth at bedtime.   levETIRAcetam 250 MG tablet Commonly known as: KEPPRA Take 250 mg by mouth daily. In the morning   LORazepam 2 MG/ML concentrated solution Commonly known as: LORazepam Intensol 0.5 mg/ml gel apply to skin twice a day as needed   memantine 10 MG tablet Commonly known as: NAMENDA TAKE 1 TABLET BY  MOUTH TWICE DAILY.   nystatin powder Commonly known as: MYCOSTATIN/NYSTOP Apply 1 application. topically 2 (two) times daily as needed (Apply to urogenital area).   OxyCODONE HCl (Abuse Deter) 5 MG Taba Commonly known as: OXAYDO Take 5 mg by mouth every 6 (six) hours as needed.  Nondisplaced transverse fracture of left patella, subsequent encounter for closed fracture with routine healing   pantoprazole 40 MG tablet Commonly known as: PROTONIX Take 40 mg by mouth daily.   potassium chloride SA 20 MEQ tablet Commonly known as: KLOR-CON M Take 20 mEq by mouth daily.   pramipexole 0.25 MG tablet Commonly known as: MIRAPEX Take 0.5 mg by mouth at bedtime.   ProAir RespiClick 283 (90 Base) MCG/ACT Aepb Generic drug: Albuterol Sulfate Inhale 2 puffs into the lungs daily as needed. Every AM PRN. Patient can keep it with her in room   risperiDONE 0.5 MG tablet Commonly known as: RISPERDAL Take 0.25 mg by mouth 2 (two) times daily.   sennosides-docusate sodium 8.6-50 MG tablet Commonly known as: SENOKOT-S Take 2 tablets by mouth at bedtime.   simvastatin 10 MG tablet Commonly known as: ZOCOR Take 10 mg by mouth at bedtime.   tiotropium 18 MCG inhalation capsule Commonly known as: Spiriva HandiHaler INHALE CONTENTS OF ONE CAPSULE ONCE DAILY FOR COPD.   triamcinolone cream 0.1 % Commonly known as: KENALOG Apply 1 application. topically daily as needed (psoriasis).        Review of Systems  Constitutional:  Negative for activity change and appetite change.  HENT: Negative.    Respiratory:  Negative for cough and shortness of breath.   Cardiovascular:  Negative for leg swelling.  Gastrointestinal:  Negative for constipation.  Genitourinary: Negative.   Musculoskeletal:  Positive for gait problem. Negative for arthralgias and myalgias.  Skin: Negative.   Neurological:  Negative for dizziness and weakness.  Psychiatric/Behavioral:  Positive for behavioral problems,  confusion, decreased concentration, dysphoric mood and hallucinations. Negative for sleep disturbance. The patient is nervous/anxious.     Immunization History  Administered Date(s) Administered   Influenza Split 01/06/2014, 01/15/2017, 01/08/2018, 12/09/2018   Influenza Whole 01/07/2012, 01/06/2013   Influenza, High Dose Seasonal PF 12/25/2015, 01/20/2017   Influenza,inj,Quad PF,6+ Mos 12/21/2014   Influenza-Unspecified 12/09/2018, 01/18/2020, 01/24/2021   Moderna SARS-COV2 Booster Vaccination 04/02/2021   Moderna Sars-Covid-2 Vaccination 04/12/2019, 06/05/2019, 02/15/2020, 09/05/2020   PFIZER(Purple Top)SARS-COV-2 Vaccination 12/26/2020   Pneumococcal Conjugate-13 02/01/2014   Pneumococcal Polysaccharide-23 12/20/1992, 01/15/2000, 04/08/2004, 06/08/2004   Td 04/08/2002, 04/22/2002   Tdap 04/09/2011, 08/17/2015   Zoster Recombinat (Shingrix) 05/29/2005, 08/27/2017   Zoster, Live 04/08/2008, 05/29/2014,  05/28/2017   Zoster, Unspecified 05/29/2005   Pertinent  Health Maintenance Due  Topic Date Due   INFLUENZA VACCINE  11/06/2021   DEXA SCAN  Completed   MAMMOGRAM  Discontinued      09/06/2021    8:00 AM 09/06/2021    7:45 PM 09/07/2021    9:00 AM 09/07/2021    9:30 PM 09/08/2021   12:40 PM  Fall Risk  Patient Fall Risk Level High fall risk High fall risk High fall risk High fall risk High fall risk   Functional Status Survey:    Vitals:   10/30/21 1359  BP: 135/75  Pulse: 76  Resp: 16  Temp: (!) 96.5 F (35.8 C)  SpO2: 94%  Weight: 151 lb 3.2 oz (68.6 kg)  Height: '5\' 2"'$  (1.575 m)   Body mass index is 27.65 kg/m. Physical Exam Vitals reviewed.  Constitutional:      Appearance: Normal appearance.  HENT:     Head: Normocephalic.     Nose: Nose normal.     Mouth/Throat:     Mouth: Mucous membranes are moist.     Pharynx: Oropharynx is clear.  Eyes:     Pupils: Pupils are equal, round, and reactive to light.  Cardiovascular:     Rate and Rhythm: Normal rate and  regular rhythm.     Pulses: Normal pulses.     Heart sounds: Normal heart sounds. No murmur heard. Pulmonary:     Effort: Pulmonary effort is normal.     Breath sounds: Normal breath sounds.  Abdominal:     General: Abdomen is flat. Bowel sounds are normal.     Palpations: Abdomen is soft.  Musculoskeletal:        General: Swelling present.     Cervical back: Neck supple.  Skin:    General: Skin is warm.  Neurological:     General: No focal deficit present.     Mental Status: She is alert.  Psychiatric:     Comments: Upset with staff Tearful Paranoid Tells me she doesn't know what she will do if she will need surgery     Labs reviewed: Recent Labs    09/01/21 0307 09/02/21 0359 09/04/21 1308 09/06/21 0449 09/27/21 0000 10/12/21 0000 10/23/21 0000  NA 139 138 144   < > 146 145 144  K 3.3* 3.2* 3.9   < > 3.8 3.7 3.2*  CL 106 104 108   < > 109* 107 107  CO2 '28 27 26   '$ < > 30* 29* 30*  GLUCOSE 99 98 102*  --   --   --   --   BUN 23 21 31*   < > '20 17 20  '$ CREATININE 0.83 0.78 0.88   < > 0.8 0.9 0.9  CALCIUM 8.7* 8.5* 8.8*   < > 9.1 9.4 8.9  MG  --  2.2  --   --   --   --   --    < > = values in this interval not displayed.   Recent Labs    05/22/21 0000 10/12/21 0000  AST 16 18  ALT 12 9  ALKPHOS  --  224*  ALBUMIN 4.1 4.0   Recent Labs    08/29/21 0930 08/30/21 0323 09/01/21 0307 09/02/21 0359 09/04/21 1308 09/11/21 0000 10/12/21 0000  WBC 3.9*   < > 6.5 7.8 7.7 6.3 4.7  NEUTROABS 2.3  --   --   --   --  4,416.00 2,712.00  HGB  12.6   < > 9.4* 9.4* 10.0* 10.0* 12.7  HCT 39.9   < > 31.1* 30.6* 32.5* 32* 40  MCV 84.2   < > 86.1 85.2 86.4  --   --   PLT 218   < > 163 190 239 422* 363   < > = values in this interval not displayed.   Lab Results  Component Value Date   TSH 1.89 05/22/2021   No results found for: "HGBA1C" Lab Results  Component Value Date   CHOL 147 09/27/2021   HDL 48 09/27/2021   LDLCALC 78 09/27/2021   TRIG 131 09/27/2021    CHOLHDL 3.6 06/27/2015    Significant Diagnostic Results in last 30 days:  CT HEAD WO CONTRAST (5MM)  Result Date: 10/23/2021 CLINICAL DATA:  New onset confusion. History of seizures. Dizziness. EXAM: CT HEAD WITHOUT CONTRAST TECHNIQUE: Contiguous axial images were obtained from the base of the skull through the vertex without intravenous contrast. RADIATION DOSE REDUCTION: This exam was performed according to the departmental dose-optimization program which includes automated exposure control, adjustment of the mA and/or kV according to patient size and/or use of iterative reconstruction technique. COMPARISON:  05/03/2019 FINDINGS: Brain: No evidence of acute infarction, hemorrhage, hydrocephalus, extra-axial collection or mass lesion/mass effect. Cerebral volume loss and chronic small vessel ischemia which is mild for age and essentially stable from 2021. Vascular: No hyperdense vessel or unexpected calcification. Skull: Remote right mandibular fracturing. Sinuses/Orbits: Bilateral cataract resection. No evidence of injury. IMPRESSION: Aging brain without recent insult or change from 2021. Electronically Signed   By: Jorje Guild M.D.   On: 10/23/2021 07:33    Assessment/Plan 1. Depression, major, recurrent, severe with psychosis ([place]) Most likely cause of her Outburst Was doing well on Lexapro and Risperdal but dose reduced due to Tremors Will try Depakote 125 mg BID  Patient refuses her meds sometimes and it has to be added to her Meals Will Repeat Labs in 6 weeks 2. Closed fracture of left hip, sequela She is very anxious as has Follow up appointment with Dr Delfino Lovett It has not been healing well  She denies any Pain Continues to do therapy [facility] right now  3. Seizure disorder ([place]) On Kepprra 4 Hallucinations New  ? Due to Depression CT scan negative Neurology Consult Written  Other issues Restless legs syndrome (RLS) Mirapex QHS    Panlobular emphysema ([facility])  Spiriva Proair  PRn   Essential hypertension Off Norvasc for now   Gastroesophageal reflux disease, unspecified whether esophagitis present Continue Protonix   Senile dementia ([place]) Highly Functional On Namenda    Mixed hyperlipidemia On simvastatin      Bilateral lower extremity edema Off Lasix . Will uses PRn    Senile osteoporosis Was on Fosmax took herself of it and does not want to take anything anymore       Family/ staff Communication:   Labs/tests ordered:

## 2021-11-02 ENCOUNTER — Encounter: Payer: Self-pay | Admitting: Nurse Practitioner

## 2021-11-02 ENCOUNTER — Non-Acute Institutional Stay (SKILLED_NURSING_FACILITY): Payer: Medicare PPO | Admitting: Nurse Practitioner

## 2021-11-02 DIAGNOSIS — I10 Essential (primary) hypertension: Secondary | ICD-10-CM | POA: Diagnosis not present

## 2021-11-02 DIAGNOSIS — J431 Panlobular emphysema: Secondary | ICD-10-CM

## 2021-11-02 DIAGNOSIS — F0390 Unspecified dementia without behavioral disturbance: Secondary | ICD-10-CM | POA: Diagnosis not present

## 2021-11-02 DIAGNOSIS — G40909 Epilepsy, unspecified, not intractable, without status epilepticus: Secondary | ICD-10-CM | POA: Diagnosis not present

## 2021-11-02 DIAGNOSIS — M159 Polyosteoarthritis, unspecified: Secondary | ICD-10-CM

## 2021-11-02 DIAGNOSIS — F323 Major depressive disorder, single episode, severe with psychotic features: Secondary | ICD-10-CM | POA: Diagnosis not present

## 2021-11-02 DIAGNOSIS — R609 Edema, unspecified: Secondary | ICD-10-CM

## 2021-11-02 DIAGNOSIS — E782 Mixed hyperlipidemia: Secondary | ICD-10-CM

## 2021-11-02 DIAGNOSIS — M79661 Pain in right lower leg: Secondary | ICD-10-CM | POA: Diagnosis not present

## 2021-11-02 DIAGNOSIS — M810 Age-related osteoporosis without current pathological fracture: Secondary | ICD-10-CM

## 2021-11-02 DIAGNOSIS — E785 Hyperlipidemia, unspecified: Secondary | ICD-10-CM | POA: Diagnosis not present

## 2021-11-02 DIAGNOSIS — K219 Gastro-esophageal reflux disease without esophagitis: Secondary | ICD-10-CM | POA: Diagnosis not present

## 2021-11-02 DIAGNOSIS — K5901 Slow transit constipation: Secondary | ICD-10-CM

## 2021-11-02 LAB — COMPREHENSIVE METABOLIC PANEL
Albumin: 3.7 (ref 3.5–5.0)
Calcium: 9.5 (ref 8.7–10.7)
Globulin: 2.4

## 2021-11-02 LAB — HEPATIC FUNCTION PANEL
ALT: 11 U/L (ref 7–35)
AST: 19 (ref 13–35)
Alkaline Phosphatase: 160 — AB (ref 25–125)
Bilirubin, Total: 0.3

## 2021-11-02 LAB — CBC AND DIFFERENTIAL
HCT: 36 (ref 36–46)
Hemoglobin: 11.6 — AB (ref 12.0–16.0)
Neutrophils Absolute: 2275
Platelets: 243 10*3/uL (ref 150–400)
WBC: 4.3

## 2021-11-02 LAB — BASIC METABOLIC PANEL
BUN: 27 — AB (ref 4–21)
CO2: 24 — AB (ref 13–22)
Chloride: 109 — AB (ref 99–108)
Creatinine: 0.8 (ref 0.5–1.1)
Glucose: 76
Potassium: 4 mEq/L (ref 3.5–5.1)
Sodium: 143 (ref 137–147)

## 2021-11-02 LAB — CBC: RBC: 4.49 (ref 3.87–5.11)

## 2021-11-02 NOTE — Progress Notes (Unsigned)
Location:  [facility] Room Number: NO/29/A Place of Service:  SNF (31) Provider:  Amica Harron X, NP [name], MD  Patient Care Team: [name], MD as PCP - General (Internal Medicine) [name], MD as Consulting Physician (Ophthalmology) [name], MD as Consulting Physician (Cardiology) [name], MD as Consulting Physician (Dermatology) [name], MD as Consulting Physician (Orthopedic Surgery) [name], MD as Consulting Physician (Orthopedic Surgery) [name], MD as Consulting Physician (Orthopedic Surgery) [place], [place], Lynsee Wands X, NP as Nurse Practitioner (Nurse Practitioner) [name], MD (Inactive) as Consulting Physician (Neurology)  Extended Emergency Contact Information Primary Emergency Contact: [name],[name] Address: [address]          [place], [place] [address] [name] of [place] Phone: [phone] Relation: Sister Secondary Emergency Contact: [name] [place] of [place] Phone: [phone] Mobile Phone: [phone] Relation: Sister  Code Status:  DNR Goals of care: Advanced Directive information    11/04/2021   10:35 AM  Advanced Directives  Does Patient Have a Medical Advance Directive? No  Does patient want to make changes to medical advance directive? No - Patient declined  Would patient like information on creating a medical advance directive? No - Patient declined  Pre-existing out of facility DNR order (yellow form or pink MOST form) Pink Most/Yellow Form available - Physician notified to receive inpatient order     Chief Complaint  Patient presents with   Acute Visit    Patient is being seen for hallucinations    HPI:  Pt is a 88 y.o. female seen today for an acute visit for reported the patient's hallucination, not opening eyes. The patient was seeing during meal, responded briefly in low voice with eye  contacts. Saw Dr. Lyndel Safe 10/30/21 for paranoia behaviors/refusing care since the left hip fx surgery. Risperdal was reduced 2/2 tremors. Also taking Lexaporo. Depakote was added 10/30/21 for behavioral/outburst management. Neagtive CT scan. Pending Neurology consultation.    OA in general, on Tylenol, Oxycodone. S/p ORIF left hip 08/29/21, TDWB due to complicated healing. A closed nondisplaced transverse fracture left patella 04/26/20, f/u Ortho s/p  ORIF.             GERD ST eval significant reflux as results of mucus production, oropharyngeal dysphagia. Takes Pantoprazole, improved.              Restless leg symptom, takes MiraPex              Hx of dementia, high functional in SNF [facility], on Memantine              Hx of seizures, stable, on Keppra, Lamictal             Edema, trace, prn Furosemide,  Bun/creat 23/0.9 05/22/21             HTN, off Amlodipine             COPD, takes Spiriva qd, prn ProAir             Constipation, takes Senokot             OP, stopped Foxamax per her request. takes Vit D.              Hyperlipidemia, takes Simvastatin, LDL 78 09/27/21   Past Medical History:  Diagnosis Date   Acute bronchitis 05/23/2011   Acute upper respiratory infections  of unspecified site 05/23/2011   Arthritis    Chronic airway obstruction, not elsewhere classified 05/23/2011   Disturbance of salivary secretion 01/31/2011   Dizziness and giddiness 01/31/2011   Dyspnea    Essential tremor 04/25/2014   External hemorrhoids without mention of complication 66/09/3014   Gait disorder 04/25/2014   Insomnia, unspecified 09/12/2011   Lumbago 01/31/2011   Major depressive disorder, single episode, unspecified 01/31/2011   Memory disorder 04/25/2014   Mitral valve disorders(424.0) 01/31/2011   Other and unspecified hyperlipidemia 01/31/2011   Other convulsions 01/31/2011   Other emphysema ([place]) 01/31/2011   Pain in joint, site unspecified 01/31/2011   Restless legs syndrome (RLS) 09/12/2011   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 Marlou Starks   ELBOW SURGERY Right 2008   broken   Dr. Apolonio Schneiders   EYE SURGERY     INTRAMEDULLARY (IM) NAIL INTERTROCHANTERIC Left 08/30/2021   Procedure: INTRAMEDULLARY (IM) NAIL INTERTROCHANTRIC;  Surgeon: Rod Can, MD;  Location: [facility];  Service: Orthopedics;  Laterality: Left;   ORIF PATELLA Left 05/02/2020   Procedure: OPEN REDUCTION INTERNAL (ORIF) FIXATION LEFT PATELLA WITH MEDIAL AND LATERAL LIGAMENT REINFORCEMENTS;  Surgeon: [name], MD;  Location: [facility];  Service: Orthopedics;  Laterality: Left;   ORIF PATELLA Left 05/30/2020   Procedure: OPEN REDUCTION INTERNAL (ORIF) FIXATION PATELLA;  Surgeon: [name], MD;  Location: [facility];  Service: Orthopedics;  Laterality: Left;   RETINAL DETACHMENT SURGERY N/A    two   REVERSE SHOULDER ARTHROPLASTY Left 05/06/2019   Procedure: REVERSE SHOULDER ARTHROPLASTY;  Surgeon: Justice Britain, MD;  Location: [facility];  Service: Orthopedics;  Laterality: Left;  160min   ROTATOR CUFF REPAIR Right 2012   Dr. Theda Sers   SQUAMOUS CELL CARCINOMA EXCISION Bilateral 2012, 8/14   [place] on legs   Dr. Sarajane Jews   TONSILLECTOMY  1941   VIDEO BRONCHOSCOPY WITH ENDOBRONCHIAL NAVIGATION N/A 11/29/2015   Procedure: VIDEO BRONCHOSCOPY WITH ENDOBRONCHIAL NAVIGATION;  Surgeon: Collene Gobble, MD;  Location: [facility];  Service: Thoracic;  Laterality: N/A;    Allergies  Allergen Reactions   Vioxx [Rofecoxib] Shortness Of Breath   Dyazide [Hydrochlorothiazide W-Triamterene] Other (See Comments)     Lowers blood pressure too much   Latex Other (See Comments)    Swelling   Sulfa Antibiotics Nausea And Vomiting   Sumycin [Tetracycline]     Can't take due to drug reaction    Outpatient Encounter Medications as of 11/02/2021  Medication Sig   acetaminophen (TYLENOL) 500 MG tablet Take 1,000 mg by mouth every 6 (six) hours as needed (pain).   Albuterol Sulfate (PROAIR RESPICLICK) 010 (90 Base) MCG/ACT AEPB Inhale 2 puffs into the lungs daily as needed. Every AM PRN. Patient can keep it with her in room   cholecalciferol (VITAMIN D) 1000 units tablet Take 1 tablet (1,000 Units total) by mouth daily.   escitalopram (LEXAPRO) 10 MG tablet Take 10 mg by mouth daily.   hydrocortisone 2.5 % lotion thin layer; topical,Twice A Day - PRN Special Instructions: apply to face and ears [DX: Psoriasis, unspecified]  hydrocortisone cream 1 % Apply 1 application. topically 2 (two) times daily as needed (psoriasis).   ketoconazole (NIZORAL) 2 % cream Apply 1 application. topically daily as needed.   ketoconazole (NIZORAL) 2 % shampoo Apply 1 application. topically See admin instructions. Monday, Thursday   lamoTRIgine (LAMICTAL) 150 MG tablet Take 150 mg by mouth 2 (two) times daily.   levETIRAcetam (KEPPRA) 250 MG tablet Take 250 mg by mouth daily. In the morning   levETIRAcetam (KEPPRA) 500 MG tablet Take 500 mg by mouth at bedtime.   LORazepam (LORAZEPAM INTENSOL) 2 MG/ML concentrated solution 0.5 mg/ml gel apply to skin twice a day as needed   memantine (NAMENDA) 10 MG tablet TAKE 1 TABLET BY MOUTH TWICE DAILY.   nystatin (MYCOSTATIN/NYSTOP) powder Apply 1 application. topically 2 (two) times daily as needed (Apply to urogenital area).   OxyCODONE HCl, Abuse Deter, (OXAYDO) 5 MG TABA Take 5 mg by mouth every 6 (six) hours as needed.  Nondisplaced transverse fracture of left patella, subsequent encounter for closed fracture with routine healing   pantoprazole (PROTONIX) 40 MG tablet Take 40 mg by mouth  daily.   pramipexole (MIRAPEX) 0.25 MG tablet Take 0.5 mg by mouth at bedtime.   risperiDONE (RISPERDAL) 0.5 MG tablet Take 0.25 mg by mouth 2 (two) times daily.   sennosides-docusate sodium (SENOKOT-S) 8.6-50 MG tablet Take 2 tablets by mouth at bedtime.   simvastatin (ZOCOR) 10 MG tablet Take 10 mg by mouth at bedtime.   tiotropium (SPIRIVA HANDIHALER) 18 MCG inhalation capsule INHALE CONTENTS OF ONE CAPSULE ONCE DAILY FOR COPD.   triamcinolone (KENALOG) 0.1 % Apply 1 application. topically daily as needed (psoriasis).   No facility-administered encounter medications on file as of 11/02/2021.    Review of Systems  Constitutional:  Negative for fatigue, fever and unexpected weight change.  HENT:  Positive for hearing loss. Negative for trouble swallowing.   Eyes:  Negative for redness, itching and visual disturbance.       Lateral right lower eyelid stye  Respiratory:  Negative for cough.        DOE is chronic  Cardiovascular:  Positive for leg swelling.  Gastrointestinal:  Negative for abdominal pain and constipation.       Acid reflux symptoms.   Genitourinary:  Negative for dysuria, hematuria and urgency.  Musculoskeletal:  Positive for arthralgias, gait problem and joint swelling.       S/p left hip ORIF, ORIF of the left patella.   Skin:  Negative for rash.       BLE discoloration, mild erythema left knee. No rash noted.   Neurological:  Positive for seizures. Negative for speech difficulty, weakness and headaches.       Memory lapses. Hx of seizures. RLS  Psychiatric/Behavioral:  Positive for behavioral problems, confusion, dysphoric mood and hallucinations. The patient is nervous/anxious.     Immunization History  Administered Date(s) Administered   Influenza Split 01/06/2014, 01/15/2017, 01/08/2018, 12/09/2018   Influenza Whole 01/07/2012, 01/06/2013   Influenza, High Dose Seasonal PF 12/25/2015, 01/20/2017   Influenza,inj,Quad PF,6+ Mos 12/21/2014   Influenza-Unspecified  12/09/2018, 01/18/2020, 01/24/2021   Moderna SARS-COV2 Booster Vaccination 04/02/2021   Moderna Sars-Covid-2 Vaccination 04/12/2019, 06/05/2019, 02/15/2020, 09/05/2020   PFIZER(Purple Top)SARS-COV-2 Vaccination 12/26/2020   Pneumococcal Conjugate-13 02/01/2014   Pneumococcal Polysaccharide-23 12/20/1992, 01/15/2000, 04/08/2004, 06/08/2004   Td 04/08/2002, 04/22/2002   Tdap 04/09/2011, 08/17/2015   Zoster Recombinat (Shingrix) 05/29/2005, 08/27/2017   Zoster, Live 04/08/2008, 05/29/2014, 05/28/2017   Zoster, Unspecified 05/29/2005   Pertinent  Health Maintenance Due  Topic Date Due   INFLUENZA VACCINE  11/06/2021   DEXA SCAN  Completed   MAMMOGRAM  Discontinued      09/06/2021    7:45 PM 09/07/2021    9:00 AM 09/07/2021    9:30 PM 09/08/2021   12:40 PM 11/04/2021   10:37 AM  Fall Risk  Patient Fall Risk Level High fall risk High fall risk High fall risk High fall risk Moderate fall risk   Functional Status Survey:    Vitals:   11/02/21 0928  BP: 135/75  Pulse: 76  Resp: 16  Temp: (!) 97.1 F (36.2 C)  SpO2: 92%  Weight: 148 lb (67.1 kg)  Height: $Remove'5\' 2"'SKBoPEN$  (1.575 m)   Body mass index is 27.07 kg/m. Physical Exam Vitals and nursing note reviewed.  Constitutional:      Appearance: Normal appearance.  HENT:     Head: Normocephalic and atraumatic.     Nose: Nose normal.     Mouth/Throat:     Mouth: Mucous membranes are moist.  Eyes:     Extraocular Movements: Extraocular movements intact.     Conjunctiva/sclera: Conjunctivae normal.     Right eye: Right conjunctiva is not injected.     Left eye: Left conjunctiva is not injected.     Pupils: Pupils are equal, round, and reactive to light.  Neck:     Comments: Left adam's apple bony aspect, sometimes feels soreness on palpation is chronic, no noted lymph nodes  Cardiovascular:     Rate and Rhythm: Normal rate and regular rhythm.     Heart sounds: Murmur heard.     Comments: PD pulses are not felt from previous examination.   Pulmonary:     Effort: Pulmonary effort is normal.     Breath sounds: Rales present.     Comments: Decreased air entry to both lungs. Bibasilar rales.  Abdominal:     General: Bowel sounds are normal.     Palpations: Abdomen is soft.     Tenderness: There is no abdominal tenderness.     Comments: Mid abd surgical scar  Musculoskeletal:     Cervical back: Normal range of motion and neck supple.     Right lower leg: Edema present.     Left lower leg: Edema present.     Comments:  Decreased overhead ROM of the left shoulder. BLE edema trace  is chronic. Left knee s/p ORIF of the patella, mild warmth has no change. S/p left hip ORIF 8 weeks out.   Skin:    General: Skin is warm and dry.     Findings: No bruising.     Comments: Brownish skin discoloration BLE.   Neurological:     General: No focal deficit present.     Mental Status: She is alert and oriented to person, place, and time. Mental status is at baseline.     Gait: Gait abnormal.  Psychiatric:     Comments: Flat affect, low voice     Labs reviewed: Recent Labs    09/02/21 0359 09/04/21 1308 09/06/21 0449 10/12/21 0000 10/23/21 0000 11/04/21 1047  NA 138 144   < > 145 144 145  K 3.2* 3.9   < > 3.7 3.2* 3.2*  CL 104 108   < > 107 107 109  CO2 27 26   < > 29* 30* 27  GLUCOSE 98 102*  --   --   --  80  BUN 21 31*   < >  $'17 20 16  'z$ CREATININE 0.78 0.88   < > 0.9 0.9 0.69  CALCIUM 8.5* 8.8*   < > 9.4 8.9 9.6  MG 2.2  --   --   --   --   --    < > = values in this interval not displayed.   Recent Labs    05/22/21 0000 10/12/21 0000 11/04/21 1047  AST $Re'16 18 20  'QTc$ ALT $R'12 9 12  'mR$ ALKPHOS  --  224* 157*  BILITOT  --   --  0.5  PROT  --   --  7.0  ALBUMIN 4.1 4.0 3.6   Recent Labs    09/02/21 0359 09/04/21 1308 09/11/21 0000 10/12/21 0000 11/04/21 1047  WBC 7.8 7.7 6.3 4.7 4.7  NEUTROABS  --   --  4,416.00 2,712.00 3.2  HGB 9.4* 10.0* 10.0* 12.7 12.9  HCT 30.6* 32.5* 32* 40 41.6  MCV 85.2 86.4  --   --  83.7   PLT 190 239 422* 363 252   Lab Results  Component Value Date   TSH 1.89 05/22/2021   No results found for: "HGBA1C" Lab Results  Component Value Date   CHOL 147 09/27/2021   HDL 48 09/27/2021   LDLCALC 78 09/27/2021   TRIG 131 09/27/2021   CHOLHDL 3.6 06/27/2015    Significant Diagnostic Results in last 30 days:  CT HEAD WO CONTRAST (5MM)  Result Date: 10/23/2021 CLINICAL DATA:  New onset confusion. History of seizures. Dizziness. EXAM: CT HEAD WITHOUT CONTRAST TECHNIQUE: Contiguous axial images were obtained from the base of the skull through the vertex without intravenous contrast. RADIATION DOSE REDUCTION: This exam was performed according to the departmental dose-optimization program which includes automated exposure control, adjustment of the mA and/or kV according to patient size and/or use of iterative reconstruction technique. COMPARISON:  05/03/2019 FINDINGS: Brain: No evidence of acute infarction, hemorrhage, hydrocephalus, extra-axial collection or mass lesion/mass effect. Cerebral volume loss and chronic small vessel ischemia which is mild for age and essentially stable from 2021. Vascular: No hyperdense vessel or unexpected calcification. Skull: Remote right mandibular fracturing. Sinuses/Orbits: Bilateral cataract resection. No evidence of injury. IMPRESSION: Aging brain without recent insult or change from 2021. Electronically Signed   By: Jorje Guild M.D.   On: 10/23/2021 07:33    Assessment/Plan Depression, psychotic ([place])  reported the patient's hallucination, not opening eyes. The patient was seeing during meal, responded briefly in low voice with eye contacts. Saw Dr. Lyndel Safe 10/30/21 for paranoia behaviors/refusing care since the left hip fx surgery. Risperdal was reduced 2/2 tremors. Also taking Lexaporo. Depakote was added 10/30/21 for behavioral/outburst management. Neagtive CT scan. Pending Neurology consultation.  Will update CBC/diff, CMP/eGFR. Observe.     Generalized osteoarthritis of multiple sites OA in general, on Tylenol, Oxycodone. S/p ORIF left hip 08/29/21, TDWB due to complicated healing. A closed nondisplaced transverse fracture left patella 04/26/20, f/u Ortho s/p  ORIF.  GERD (gastroesophageal reflux disease) ST eval significant reflux as results of mucus production, oropharyngeal dysphagia. Takes Pantoprazole, improved.   Pain of right lower leg  takes MiraPex   Senile dementia (HCC)  high functional in SNF [facility], on Memantine   Seizure disorder (HCC) stable, on Keppra, Lamictal  Edema  trace, prn Furosemide,  Bun/creat 23/0.9 05/22/21  Essential hypertension Blood pressure is controlled, off Amlodipine.   COPD (chronic obstructive pulmonary disease) (HCC) Stable, takes Spiriva qd, prn ProAir  Slow transit constipation Stable, continue Senokot   Senile osteoporosis  stopped Foxamax per  her request. takes Vit D.   Hyperlipidemia  takes Simvastatin, LDL 78 09/27/21     Family/ staff Communication: plan of care reviewed with the patient and charge nurse.   Labs/tests ordered: CBC/diff, CMP/eGFR  Time spend 45 minutes.

## 2021-11-04 ENCOUNTER — Emergency Department (HOSPITAL_COMMUNITY): Payer: Medicare PPO

## 2021-11-04 ENCOUNTER — Emergency Department (HOSPITAL_COMMUNITY)
Admission: EM | Admit: 2021-11-04 | Discharge: 2021-11-05 | Disposition: A | Discharge status: Home / Self Care | Payer: Medicare PPO | Attending: Emergency Medicine | Admitting: Emergency Medicine

## 2021-11-04 ENCOUNTER — Other Ambulatory Visit: Payer: Self-pay

## 2021-11-04 ENCOUNTER — Encounter: Payer: Self-pay | Admitting: Nurse Practitioner

## 2021-11-04 ENCOUNTER — Encounter (HOSPITAL_COMMUNITY): Payer: Self-pay

## 2021-11-04 DIAGNOSIS — R9431 Abnormal electrocardiogram [ECG] [EKG]: Secondary | ICD-10-CM | POA: Diagnosis not present

## 2021-11-04 DIAGNOSIS — R443 Hallucinations, unspecified: Secondary | ICD-10-CM | POA: Diagnosis not present

## 2021-11-04 DIAGNOSIS — F0390 Unspecified dementia without behavioral disturbance: Secondary | ICD-10-CM | POA: Diagnosis present

## 2021-11-04 DIAGNOSIS — R4689 Other symptoms and signs involving appearance and behavior: Secondary | ICD-10-CM | POA: Diagnosis not present

## 2021-11-04 DIAGNOSIS — R627 Adult failure to thrive: Secondary | ICD-10-CM | POA: Diagnosis not present

## 2021-11-04 DIAGNOSIS — F323 Major depressive disorder, single episode, severe with psychotic features: Secondary | ICD-10-CM | POA: Diagnosis present

## 2021-11-04 DIAGNOSIS — F068 Other specified mental disorders due to known physiological condition: Secondary | ICD-10-CM | POA: Insufficient documentation

## 2021-11-04 DIAGNOSIS — F69 Unspecified disorder of adult personality and behavior: Secondary | ICD-10-CM | POA: Diagnosis not present

## 2021-11-04 DIAGNOSIS — E876 Hypokalemia: Secondary | ICD-10-CM | POA: Insufficient documentation

## 2021-11-04 DIAGNOSIS — R456 Violent behavior: Secondary | ICD-10-CM | POA: Diagnosis not present

## 2021-11-04 DIAGNOSIS — R4189 Other symptoms and signs involving cognitive functions and awareness: Secondary | ICD-10-CM | POA: Diagnosis not present

## 2021-11-04 DIAGNOSIS — F332 Major depressive disorder, recurrent severe without psychotic features: Secondary | ICD-10-CM | POA: Insufficient documentation

## 2021-11-04 DIAGNOSIS — F22 Delusional disorders: Secondary | ICD-10-CM

## 2021-11-04 DIAGNOSIS — Z20822 Contact with and (suspected) exposure to covid-19: Secondary | ICD-10-CM | POA: Insufficient documentation

## 2021-11-04 DIAGNOSIS — F331 Major depressive disorder, recurrent, moderate: Secondary | ICD-10-CM | POA: Diagnosis not present

## 2021-11-04 DIAGNOSIS — I10 Essential (primary) hypertension: Secondary | ICD-10-CM | POA: Diagnosis not present

## 2021-11-04 DIAGNOSIS — J449 Chronic obstructive pulmonary disease, unspecified: Secondary | ICD-10-CM | POA: Diagnosis not present

## 2021-11-04 DIAGNOSIS — G4700 Insomnia, unspecified: Secondary | ICD-10-CM | POA: Insufficient documentation

## 2021-11-04 DIAGNOSIS — Z91040 Latex allergy status: Secondary | ICD-10-CM | POA: Insufficient documentation

## 2021-11-04 DIAGNOSIS — F339 Major depressive disorder, recurrent, unspecified: Secondary | ICD-10-CM | POA: Diagnosis present

## 2021-11-04 DIAGNOSIS — R338 Other retention of urine: Secondary | ICD-10-CM | POA: Diagnosis not present

## 2021-11-04 DIAGNOSIS — F600 Paranoid personality disorder: Secondary | ICD-10-CM | POA: Insufficient documentation

## 2021-11-04 DIAGNOSIS — Z91148 Patient's other noncompliance with medication regimen for other reason: Secondary | ICD-10-CM | POA: Diagnosis not present

## 2021-11-04 DIAGNOSIS — R441 Visual hallucinations: Secondary | ICD-10-CM | POA: Diagnosis not present

## 2021-11-04 DIAGNOSIS — J9811 Atelectasis: Secondary | ICD-10-CM | POA: Diagnosis not present

## 2021-11-04 DIAGNOSIS — R442 Other hallucinations: Secondary | ICD-10-CM | POA: Diagnosis not present

## 2021-11-04 DIAGNOSIS — Z79899 Other long term (current) drug therapy: Secondary | ICD-10-CM | POA: Diagnosis not present

## 2021-11-04 DIAGNOSIS — Z043 Encounter for examination and observation following other accident: Secondary | ICD-10-CM | POA: Diagnosis present

## 2021-11-04 LAB — COMPREHENSIVE METABOLIC PANEL
ALT: 12 U/L (ref 0–44)
AST: 20 U/L (ref 15–41)
Albumin: 3.6 g/dL (ref 3.5–5.0)
Alkaline Phosphatase: 157 U/L — ABNORMAL HIGH (ref 38–126)
Anion gap: 9 (ref 5–15)
BUN: 16 mg/dL (ref 8–23)
CO2: 27 mmol/L (ref 22–32)
Calcium: 9.6 mg/dL (ref 8.9–10.3)
Chloride: 109 mmol/L (ref 98–111)
Creatinine, Ser: 0.69 mg/dL (ref 0.44–1.00)
GFR, Estimated: >60 mL/min (ref >60)
Glucose, Bld: 80 mg/dL (ref 70–99)
Potassium: 3.2 mmol/L — ABNORMAL LOW (ref 3.5–5.1)
Sodium: 145 mmol/L (ref 135–145)
Total Bilirubin: 0.5 mg/dL (ref 0.3–1.2)
Total Protein: 7 g/dL (ref 6.5–8.1)

## 2021-11-04 LAB — ETHANOL: Alcohol, Ethyl (B): <10 mg/dL (ref <10)

## 2021-11-04 LAB — RESP PANEL BY RT-PCR (FLU A&B, COVID) ARPGX2
Influenza A by PCR: NEGATIVE
Influenza B by PCR: NEGATIVE
SARS Coronavirus 2 by RT PCR: NEGATIVE

## 2021-11-04 LAB — CBC WITH DIFFERENTIAL/PLATELET
Abs Immature Granulocytes: 0.02 10*3/uL (ref 0.00–0.07)
Basophils Absolute: 0 10*3/uL (ref 0.0–0.1)
Basophils Relative: 1 %
Eosinophils Absolute: 0.1 10*3/uL (ref 0.0–0.5)
Eosinophils Relative: 2 %
HCT: 41.6 % (ref 36.0–46.0)
Hemoglobin: 12.9 g/dL (ref 12.0–15.0)
Immature Granulocytes: 0 %
Lymphocytes Relative: 16 %
Lymphs Abs: 0.7 10*3/uL (ref 0.7–4.0)
MCH: 26 pg (ref 26.0–34.0)
MCHC: 31 g/dL (ref 30.0–36.0)
MCV: 83.7 fL (ref 80.0–100.0)
Monocytes Absolute: 0.6 10*3/uL (ref 0.1–1.0)
Monocytes Relative: 13 %
Neutro Abs: 3.2 10*3/uL (ref 1.7–7.7)
Neutrophils Relative %: 68 %
Platelets: 252 10*3/uL (ref 150–400)
RBC: 4.97 MIL/uL (ref 3.87–5.11)
RDW: 16.3 % — ABNORMAL HIGH (ref 11.5–15.5)
WBC: 4.7 10*3/uL (ref 4.0–10.5)
nRBC: 0 % (ref 0.0–0.2)

## 2021-11-04 LAB — URINALYSIS, ROUTINE W REFLEX MICROSCOPIC
Bacteria, UA: NONE SEEN
Bilirubin Urine: NEGATIVE
Glucose, UA: NEGATIVE mg/dL
Ketones, ur: 20 mg/dL — AB
Leukocytes,Ua: NEGATIVE
Nitrite: NEGATIVE
Protein, ur: NEGATIVE mg/dL
Specific Gravity, Urine: 1.014 (ref 1.005–1.030)
pH: 6 (ref 5.0–8.0)

## 2021-11-04 LAB — RAPID URINE DRUG SCREEN, HOSP PERFORMED
Amphetamines: NOT DETECTED
Barbiturates: NOT DETECTED
Benzodiazepines: NOT DETECTED
Cocaine: NOT DETECTED
Opiates: NOT DETECTED
Tetrahydrocannabinol: NOT DETECTED

## 2021-11-04 LAB — VALPROIC ACID LEVEL: Valproic Acid Lvl: 11 ug/mL — ABNORMAL LOW (ref 50.0–100.0)

## 2021-11-04 MED ORDER — SENNOSIDES-DOCUSATE SODIUM 8.6-50 MG PO TABS: 2.0000 | ORAL_TABLET | Freq: Every evening | ORAL | Status: DC | PRN

## 2021-11-04 MED ORDER — UMECLIDINIUM BROMIDE 62.5 MCG/ACT IN AEPB
1.0000 | INHALATION_SPRAY | Freq: Every day | RESPIRATORY_TRACT | Status: DC
  Administered 2021-11-05: 1 via RESPIRATORY_TRACT
  Filled 2021-11-04 (×2): qty 7

## 2021-11-04 MED ORDER — LIDOCAINE HCL (PF) 1 % IJ SOLN
30.0000 mL | Freq: Once | INTRAMUSCULAR | Status: AC
  Administered 2021-11-04: 30 mL via INTRADERMAL
  Filled 2021-11-04: qty 30

## 2021-11-04 MED ORDER — ALBUTEROL SULFATE (2.5 MG/3ML) 0.083% IN NEBU: 2.0000 mL | INHALATION_SOLUTION | Freq: Four times a day (QID) | RESPIRATORY_TRACT | Status: DC | PRN

## 2021-11-04 MED ORDER — LEVETIRACETAM 500 MG PO TABS
500.0000 mg | ORAL_TABLET | Freq: Every day | ORAL | Status: DC
  Administered 2021-11-04: 500 mg via ORAL
  Filled 2021-11-04: qty 1

## 2021-11-04 MED ORDER — TIOTROPIUM BROMIDE MONOHYDRATE 18 MCG IN CAPS
18.0000 ug | ORAL_CAPSULE | RESPIRATORY_TRACT | Status: DC
Start: 2021-11-04 — End: 2021-11-04

## 2021-11-04 MED ORDER — DIVALPROEX SODIUM 125 MG PO CSDR
125.0000 mg | DELAYED_RELEASE_CAPSULE | Freq: Two times a day (BID) | ORAL | Status: DC
  Administered 2021-11-04 – 2021-11-05 (×3): 125 mg via ORAL
  Filled 2021-11-04 (×3): qty 1

## 2021-11-04 MED ORDER — SIMVASTATIN 10 MG PO TABS
10.0000 mg | ORAL_TABLET | Freq: Every day | ORAL | Status: DC
  Administered 2021-11-04: 10 mg via ORAL
  Filled 2021-11-04: qty 1

## 2021-11-04 MED ORDER — SODIUM CHLORIDE 0.9 % IV SOLN
1.0000 g | Freq: Once | INTRAVENOUS | Status: AC
  Administered 2021-11-04: 1 g via INTRAVENOUS
  Filled 2021-11-04: qty 10

## 2021-11-04 MED ORDER — OXYCODONE HCL 5 MG PO TABS
5.0000 mg | ORAL_TABLET | Freq: Four times a day (QID) | ORAL | Status: DC | PRN
  Administered 2021-11-04: 5 mg via ORAL
  Filled 2021-11-04: qty 1

## 2021-11-04 MED ORDER — MEMANTINE HCL 5 MG PO TABS
10.0000 mg | ORAL_TABLET | Freq: Two times a day (BID) | ORAL | Status: DC
  Administered 2021-11-04 – 2021-11-05 (×3): 10 mg via ORAL
  Filled 2021-11-04 (×3): qty 2

## 2021-11-04 MED ORDER — MELATONIN 3 MG PO TABS
3.0000 mg | ORAL_TABLET | Freq: Every day | ORAL | Status: DC
  Filled 2021-11-04: qty 1

## 2021-11-04 MED ORDER — POTASSIUM CHLORIDE CRYS ER 20 MEQ PO TBCR
40.0000 meq | EXTENDED_RELEASE_TABLET | Freq: Once | ORAL | Status: AC
Start: 2021-11-04 — End: 2021-11-04
  Administered 2021-11-04: 40 meq via ORAL
  Filled 2021-11-04: qty 2

## 2021-11-04 MED ORDER — LAMOTRIGINE 25 MG PO TABS
150.0000 mg | ORAL_TABLET | Freq: Two times a day (BID) | ORAL | Status: DC
  Administered 2021-11-04 – 2021-11-05 (×3): 150 mg via ORAL
  Filled 2021-11-04 (×3): qty 2

## 2021-11-04 MED ORDER — PRAMIPEXOLE DIHYDROCHLORIDE 0.25 MG PO TABS
0.5000 mg | ORAL_TABLET | Freq: Every day | ORAL | Status: DC
  Administered 2021-11-04: 0.5 mg via ORAL
  Filled 2021-11-04: qty 2

## 2021-11-04 MED ORDER — LEVETIRACETAM 250 MG PO TABS
250.0000 mg | ORAL_TABLET | Freq: Every day | ORAL | Status: DC
  Administered 2021-11-04 – 2021-11-05 (×2): 250 mg via ORAL
  Filled 2021-11-04 (×2): qty 1

## 2021-11-04 MED ORDER — ESCITALOPRAM OXALATE 10 MG PO TABS
10.0000 mg | ORAL_TABLET | Freq: Every day | ORAL | Status: DC
  Administered 2021-11-04 – 2021-11-05 (×2): 10 mg via ORAL
  Filled 2021-11-04 (×2): qty 1

## 2021-11-04 MED ORDER — ACETAMINOPHEN 500 MG PO TABS: 1000.0000 mg | ORAL_TABLET | Freq: Four times a day (QID) | ORAL | Status: DC | PRN

## 2021-11-04 MED ORDER — PANTOPRAZOLE SODIUM 40 MG PO TBEC
40.0000 mg | DELAYED_RELEASE_TABLET | Freq: Every day | ORAL | Status: DC
  Administered 2021-11-04 – 2021-11-05 (×2): 40 mg via ORAL
  Filled 2021-11-04 (×2): qty 1

## 2021-11-04 NOTE — Assessment & Plan Note (Signed)
takes Simvastatin, LDL 78 09/27/21

## 2021-11-04 NOTE — Assessment & Plan Note (Signed)
high functional in SNF [facility], on Memantine

## 2021-11-04 NOTE — Assessment & Plan Note (Signed)
Stable, takes Spiriva qd, prn ProAir

## 2021-11-04 NOTE — Assessment & Plan Note (Signed)
ST eval significant reflux as results of mucus production, oropharyngeal dysphagia. Takes Pantoprazole, improved.

## 2021-11-04 NOTE — Consult Note (Signed)
[facility] ED ASSESSMENT   Reason for Consult:  psych consult Referring Physician:  Jacqlyn Larsen, PA-C Patient Identification: [name] MRN:  [id number] ED Chief Complaint: Major depressive disorder, recurrent ([place])  Diagnosis:  Principal Problem:   Major depressive disorder, recurrent ([place]) Active Problems:   Depression, psychotic ([place])   Senile dementia ([place])   Insomnia, unspecified   ED Assessment Time Calculation: No data recorded  Subjective:   [name] is a 88 y.o. female patient admitted with hallucinations.  On assessment pt presents alert and oriented to person, place, month/year, and partial to situation "I must've said something about hallucinations". Calm and cooperative. 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   Patient reports being at her current facility 13-14 years, 2 sisters also live at facility in different buildings. No children. States she's been "very sad about things that are very personal, I don't wanna talk about. This morning I refused to take them (medications) due to lack of trust in medical staff, now that is not a hallucination". She endorses recent passive suicidal thoughts of "wanting to die" due to changes in health, pain, and sadness; attributes thoughts being from depression and poor coping. She denies any active thoughts, intent or plan of suicide or homicide; auditory or visual hallucinations. 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 Endorses feeling safe at current facility "I've just been upset".   Patient is alert and oriented 3/4, able to name 3 common objects, (takes longer than 1 seconds to name). Able to spell "world" backwards. Recall 3 words; repeat. Serial 2's. Serial 7's 4/7. Language appropriate and able to follow 3 stage commands. Able to interpret proverbs. Failed clocked test 1/4. MMSE  adjusted to 27 due to pt inability to see without proper vision aides; 25/27 adjusted MMSE questionably significant.   Collateral: [name] [phone] [facility]) [place] pt has been refusing care and medications. Saying pt is saying staff is doing things to hurt her. Complaining of pain; refusing to take pain medication. Says first shift reports pt being pale in color; been in bed past few days refusing to get out of bed. Increased confusion. Staff manages pt medications, pt room is close to RN station, denies any concerns for self harm. Staff is not giving pt any knives or sharp objects as precaution. Says pt has not been herself and is not at baseline. Required IM Haldol recently due to combative behavior. Family (sister) did come in to see pt yesterday with no change in behavior. States facility sent pt out due to her refusing all care  HPI:  [name] is an 88 year old female with past psychiatric of major depressive disorder, senile dementia, seizure disorder who presented to [facility] from living facility for failure to thrive after pt refused self care, medications, meals, with recent onset of hallucinations.  Per chart review, pt s/p ORIF (11/20/46) with complicated healing. Recently seen by internal medicine 10/30/21, "Since then she acute episode of hallucinations and paranoia. The CT scan of the head was negative. Patient was very depressed crying snapping and other nurses refusing care. She was started on Lexapro and Risperdal  all was added as she was refusing her meds.  But then patient started having some tremors and the dose of Risperdal was decreased. Since the dose has been reduced she is having behavior issues again.  Today she  told me that she thinks everybody hates her and wants her to die.  She also thinks that her outbursts are due to some neurological problem and she wants to see neurology".  BAL<10. UA outstanding; CT obtained, WNL. Depakote recently  started, current level 11.  Past Psychiatric History: major depressive disorder, senile dementia, seizure disorder  Risk to Self or Others: Is the patient at risk to self? No; pt denies Has the patient been a risk to self in the past 6 months? No Has the patient been a risk to self within the distant past? No Is the patient a risk to others? No Has the patient been a risk to others in the past 6 months? No Has the patient been a risk to others within the distant past? No  [place] Scale:  [place] ED from 11/04/2021 in [place] DEPT ED to Hosp-Admission (Discharged) from 08/29/2021 in [place] Admission (Discharged) from 05/30/2020 in [place] No Risk No Risk No Risk       AIMS:  , , ,  ,   ASAM:    Substance Abuse:     Past Medical History:  Past Medical History:  Diagnosis Date   Acute bronchitis 05/23/2011   Acute upper respiratory infections of unspecified site 05/23/2011   Arthritis    Chronic airway obstruction, not elsewhere classified 05/23/2011   Disturbance of salivary secretion 01/31/2011   Dizziness and giddiness 01/31/2011   Dyspnea    Essential tremor 04/25/2014   External hemorrhoids without mention of complication 19/37/9024   Gait disorder 04/25/2014   Insomnia, unspecified 09/12/2011   Lumbago 01/31/2011   Major depressive disorder, single episode, unspecified 01/31/2011   Memory disorder 04/25/2014   Mitral valve disorders(424.0) 01/31/2011   Other and unspecified hyperlipidemia 01/31/2011   Other convulsions 01/31/2011   Other emphysema ([place]) 01/31/2011   Pain in joint, site unspecified 01/31/2011   Restless legs syndrome (RLS) 09/12/2011   Retinal detachment with retinal defect of right eye 2011   right eye twice   Seizure disorder ([place])    Senile osteoporosis 01/31/2011   Spontaneous ecchymoses 01/31/2011   Stiffness of joints, not elsewhere classified,  multiple sites 01/31/2011   Unspecified essential hypertension 01/31/2011    Past Surgical History:  Procedure Laterality Date   ABDOMINAL HYSTERECTOMY  06/21/2003   TAH/BSO, omenectomy PSB resect, Stg IC cystadenofibroma   CHOLECYSTECTOMY  2005   Dr. Marlou Starks   ELBOW SURGERY Right 2008   broken   Dr. Apolonio Schneiders   EYE SURGERY     INTRAMEDULLARY (IM) NAIL INTERTROCHANTERIC Left 08/30/2021   Procedure: INTRAMEDULLARY (IM) NAIL INTERTROCHANTRIC;  Surgeon: Rod Can, MD;  Location: [facility];  Service: Orthopedics;  Laterality: Left;   ORIF PATELLA Left 05/02/2020   Procedure: OPEN REDUCTION INTERNAL (ORIF) FIXATION LEFT PATELLA WITH MEDIAL AND LATERAL LIGAMENT REINFORCEMENTS;  Surgeon: [name], MD;  Location: [facility];  Service: Orthopedics;  Laterality: Left;   ORIF PATELLA Left 05/30/2020   Procedure: OPEN REDUCTION INTERNAL (ORIF) FIXATION PATELLA;  Surgeon: [name], MD;  Location: [facility];  Service: Orthopedics;  Laterality: Left;   RETINAL DETACHMENT SURGERY N/A    two   REVERSE SHOULDER ARTHROPLASTY Left 05/06/2019   Procedure: REVERSE SHOULDER ARTHROPLASTY;  Surgeon: Justice Britain, MD;  Location: [facility];  Service: Orthopedics;  Laterality: Left;  1100mn   ROTATOR CUFF REPAIR Right 2012   Dr. CTheda Sers  SQUAMOUS CELL CARCINOMA EXCISION  Bilateral 2012, 8/14   [place] on legs   Dr. Sarajane Jews   TONSILLECTOMY  1941   VIDEO BRONCHOSCOPY WITH ENDOBRONCHIAL NAVIGATION N/A 11/29/2015   Procedure: VIDEO BRONCHOSCOPY WITH ENDOBRONCHIAL NAVIGATION;  Surgeon: Collene Gobble, MD;  Location: [facility];  Service: Thoracic;  Laterality: N/A;   Family History:  Family History  Problem Relation Age of Onset   Heart disease Father        CHF   Cancer Mother        breast   Seizures Sister    Family Psychiatric  History: not noted Social History:  Social History   Substance and Sexual Activity  Alcohol Use No   Alcohol/week: 0.0 standard drinks of alcohol     Social History   Substance  and Sexual Activity  Drug Use No    Social History   Socioeconomic History   Marital status: Divorced    Spouse name: Not on file   Number of children: 0   Years of education: Not on file   Highest education level: Not on file  Occupational History   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  Tobacco Use   Smoking status: Former    Packs/day: 1.00    Years: 40.00    Total pack years: 40.00    Types: Cigarettes    Quit date: 04/08/1988    Years since quitting: 33.5   Smokeless tobacco: Never   Tobacco comments:    quit in [place] Use   Vaping Use: Never used  Substance and Sexual Activity   Alcohol use: No    Alcohol/week: 0.0 standard drinks of alcohol   Drug use: No   Sexual activity: Never  Other Topics Concern   Not on file  Social History Narrative   Lives at [facility]   No children   Divorced   Exercise climbs steps 4 flights daily PT for balance   Alcohol none   Stopped smoking 1989   Patient drinks 3 large sodas daily.   Patient is right handed.   Social Determinants of Health   Financial Resource Strain: Not on file  Food Insecurity: Not on file  Transportation Needs: Not on file  Physical Activity: Not on file  Stress: Not on file  Social Connections: Not on file   Additional Social History:  Dementia diagnosis  Allergies:   Allergies  Allergen Reactions   Vioxx [Rofecoxib] Shortness Of Breath   Dyazide [Hydrochlorothiazide W-Triamterene] Other (See Comments)    Lowers blood pressure too much   Latex Swelling and Other (See Comments)    "Allergic," per [facility]   Sulfa Antibiotics Nausea And Vomiting and Other (See Comments)    "Allergic," per [facility]   Sumycin [Tetracycline] Other (See Comments)    Can't take due to drug reaction- "Allergic," per [facility]    Labs:  Results for orders placed or performed during the hospital encounter of 11/04/21 (from the past 48 hour(s))  Comprehensive metabolic panel     Status: Abnormal    Collection Time: 11/04/21 10:47 AM  Result Value Ref Range   Sodium 145 135 - 145 mmol/L   Potassium 3.2 (L) 3.5 - 5.1 mmol/L   Chloride 109 98 - 111 mmol/L   CO2 27 22 - 32 mmol/L   Glucose, Bld 80 70 - 99 mg/dL    Comment: Glucose reference range applies only to samples taken after fasting for at least 8 hours.   BUN 16 8 - 23 mg/dL  Creatinine, Ser 0.69 0.44 - 1.00 mg/dL   Calcium 9.6 8.9 - 10.3 mg/dL   Total Protein 7.0 6.5 - 8.1 g/dL   Albumin 3.6 3.5 - 5.0 g/dL   AST 20 15 - 41 U/L   ALT 12 0 - 44 U/L   Alkaline Phosphatase 157 (H) 38 - 126 U/L   Total Bilirubin 0.5 0.3 - 1.2 mg/dL   GFR, Estimated >60 >60 mL/min    Comment: (NOTE) Calculated using the CKD-EPI Creatinine Equation (2021)    Anion gap 9 5 - 15    Comment: Performed at [facility], [place] [address], [place], [place] [address]  Ethanol     Status: None   Collection Time: 11/04/21 10:47 AM  Result Value Ref Range   Alcohol, Ethyl (B) <10 <10 mg/dL    Comment: (NOTE) Lowest detectable limit for serum alcohol is 10 mg/dL.  For medical purposes only. Performed at [facility], [place] [address] [place], [place] [address]   CBC with Diff     Status: Abnormal   Collection Time: 11/04/21 10:47 AM  Result Value Ref Range   WBC 4.7 4.0 - 10.5 K/uL   RBC 4.97 3.87 - 5.11 MIL/uL   Hemoglobin 12.9 12.0 - 15.0 g/dL   HCT 41.6 36.0 - 46.0 %   MCV 83.7 80.0 - 100.0 fL   MCH 26.0 26.0 - 34.0 pg   MCHC 31.0 30.0 - 36.0 g/dL   RDW 16.3 (H) 11.5 - 15.5 %   Platelets 252 150 - 400 K/uL   nRBC 0.0 0.0 - 0.2 %   Neutrophils Relative % 68 %   Neutro Abs 3.2 1.7 - 7.7 K/uL   Lymphocytes Relative 16 %   Lymphs Abs 0.7 0.7 - 4.0 K/uL   Monocytes Relative 13 %   Monocytes Absolute 0.6 0.1 - 1.0 K/uL   Eosinophils Relative 2 %   Eosinophils Absolute 0.1 0.0 - 0.5 K/uL   Basophils Relative 1 %   Basophils Absolute 0.0 0.0 - 0.1 K/uL   Immature Granulocytes 0 %   Abs Immature  Granulocytes 0.02 0.00 - 0.07 K/uL    Comment: Performed at [facility], [place] [address] [place], [place] [address]  Valproic acid level     Status: Abnormal   Collection Time: 11/04/21 10:47 AM  Result Value Ref Range   Valproic Acid Lvl 11 (L) 50.0 - 100.0 ug/mL    Comment: Performed at [facility], [place] [address], [place], [place] [address]  Resp Panel by RT-PCR (Flu A&B, Covid) Anterior Nasal Swab     Status: None   Collection Time: 11/04/21 11:55 AM   Specimen: Anterior Nasal Swab  Result Value Ref Range   SARS Coronavirus 2 by RT PCR NEGATIVE NEGATIVE    Comment: (NOTE) SARS-CoV-2 target nucleic acids are NOT DETECTED.  The SARS-CoV-2 RNA is generally detectable in upper respiratory specimens during the acute phase of infection. The lowest concentration of SARS-CoV-2 viral copies this assay can detect is 138 copies/mL. A negative result does not preclude SARS-Cov-2 infection and should not be used as the sole basis for treatment or other patient management decisions. A negative result may occur with  improper specimen collection/handling, submission of specimen other than nasopharyngeal swab, presence of viral mutation(s) within the areas targeted by this assay, and inadequate number of viral copies(<138 copies/mL). A negative result must be combined with clinical observations, patient history, and epidemiological information. The expected result  is Negative.  Fact Sheet for Patients:  EntrepreneurPulse.com.au  Fact Sheet for Healthcare Providers:  IncredibleEmployment.be  This test is no t yet approved or cleared by the [place] FDA and  has been authorized for detection and/or diagnosis of SARS-CoV-2 by FDA under an Emergency Use Authorization (EUA). This EUA will remain  in effect (meaning this test can be used) for the duration of the COVID-19 declaration under Section 564(b)(1) of the Act,  21 U.S.C.section 360bbb-3(b)(1), unless the authorization is terminated  or revoked sooner.       Influenza A by PCR NEGATIVE NEGATIVE   Influenza B by PCR NEGATIVE NEGATIVE    Comment: (NOTE) The Xpert Xpress SARS-CoV-2/FLU/RSV plus assay is intended as an aid in the diagnosis of influenza from Nasopharyngeal swab specimens and should not be used as a sole basis for treatment. Nasal washings and aspirates are unacceptable for Xpert Xpress SARS-CoV-2/FLU/RSV testing.  Fact Sheet for Patients: EntrepreneurPulse.com.au  Fact Sheet for Healthcare Providers: IncredibleEmployment.be  This test is not yet approved or cleared by the [place] FDA and has been authorized for detection and/or diagnosis of SARS-CoV-2 by FDA under an Emergency Use Authorization (EUA). This EUA will remain in effect (meaning this test can be used) for the duration of the COVID-19 declaration under Section 564(b)(1) of the Act, 21 U.S.C. section 360bbb-3(b)(1), unless the authorization is terminated or revoked.  Performed at [facility], [place] [address] [place], [place] [address]     Current Facility-Administered Medications  Medication Dose Route Frequency Provider Last Rate Last Admin   acetaminophen (TYLENOL) tablet 1,000 mg  1,000 mg Oral Q6H PRN Jacqlyn Larsen, PA-C       albuterol (PROVENTIL) (2.5 MG/3ML) 0.083% nebulizer solution 2 mL  2 mL Inhalation Q6H PRN Benedetto Goad N, PA-C       divalproex (DEPAKOTE SPRINKLE) capsule 125 mg  125 mg Oral BID Jacqlyn Larsen, PA-C   125 mg at 11/04/21 1553   escitalopram (LEXAPRO) tablet 10 mg  10 mg Oral Daily Jacqlyn Larsen, [place]   10 mg at 11/04/21 1552   lamoTRIgine (LAMICTAL) tablet 150 mg  150 mg Oral BID Jacqlyn Larsen, PA-C   150 mg at 11/04/21 1552   levETIRAcetam (KEPPRA) tablet 250 mg  250 mg Oral Daily Jacqlyn Larsen, [place]   250 mg at 11/04/21 1553   levETIRAcetam (KEPPRA) tablet 500 mg   500 mg Oral QHS Benedetto Goad N, PA-C       memantine [facility]) tablet 10 mg  10 mg Oral BID Benedetto Goad N, PA-C   10 mg at 11/04/21 1552   oxyCODONE (Oxy IR/ROXICODONE) immediate release tablet 5 mg  5 mg Oral Q6H PRN Jacqlyn Larsen, PA-C       pantoprazole (PROTONIX) EC tablet 40 mg  40 mg Oral Daily Benedetto Goad N, [place]   40 mg at 11/04/21 1551   pramipexole (MIRAPEX) tablet 0.5 mg  0.5 mg Oral QHS Benedetto Goad N, PA-C       senna-docusate (Senokot-S) tablet 2 tablet  2 tablet Oral QHS PRN Jacqlyn Larsen, PA-C       simvastatin (ZOCOR) tablet 10 mg  10 mg Oral QHS Ford, Kelsey N, PA-C       umeclidinium bromide (INCRUSE ELLIPTA) 62.5 MCG/ACT 1 puff  1 puff Inhalation Daily Eudelia Bunch, [facility]       Current Outpatient Medications  Medication Sig Dispense Refill   acetaminophen (TYLENOL) 500 MG  tablet Take 1,000 mg by mouth every 6 (six) hours as needed (for pain).     Albuterol Sulfate (PROAIR RESPICLICK) 630 (90 Base) MCG/ACT AEPB Inhale 2 puffs into the lungs See admin instructions. Inhale 2 puffs into the lungs every morning as needed for shortness of breath or wheezing     Cholecalciferol (VITAMIN D3) 25 MCG (1000 UT) CAPS Take 1,000 Units by mouth daily.     divalproex (DEPAKOTE SPRINKLE) 125 MG capsule Take 125 mg by mouth 2 (two) times daily.     escitalopram (LEXAPRO) 10 MG tablet Take 10 mg by mouth daily.     hydrocortisone 2.5 % lotion Apply 1 application  topically 2 (two) times daily as needed (to scaly areas of face and ears).     hydrocortisone cream 1 % Apply 1 application  topically 2 (two) times daily as needed for itching (to scaly areas of face and ears).     ketoconazole (NIZORAL) 2 % cream Apply 1 application  topically daily as needed (to affected areas for psoriasis).     ketoconazole (NIZORAL) 2 % shampoo Apply 1 application  topically See admin instructions. Shampoo on Mondays and Thursdays     lamoTRIgine (LAMICTAL) 150 MG tablet Take 150 mg by mouth 2 (two) times  daily.     levETIRAcetam (KEPPRA) 250 MG tablet Take 250 mg by mouth daily. In the morning     levETIRAcetam (KEPPRA) 500 MG tablet Take 500 mg by mouth at bedtime.     memantine (NAMENDA) 10 MG tablet TAKE 1 TABLET BY MOUTH TWICE DAILY. (Patient taking differently: Take 10 mg by mouth 2 (two) times daily.) 180 tablet 1   nystatin (MYCOSTATIN/NYSTOP) powder Apply 1 application  topically 2 (two) times daily as needed (urogenital area).     OxyCODONE HCl, Abuse Deter, (OXAYDO) 5 MG TABA Take 5 mg by mouth every 6 (six) hours as needed.  Nondisplaced transverse fracture of left patella, subsequent encounter for closed fracture with routine healing (Patient taking differently: Take 5 mg by mouth every 6 (six) hours as needed (for severe pain).) 60 tablet 0   pramipexole (MIRAPEX) 0.5 MG tablet Take 0.5 mg by mouth at bedtime.     PRESCRIPTION MEDICATION Apply 0.5 mg topically See admin instructions. Lorazepam 0.5 mg/ml gel: Apply 0.5 mg topically two times a day as needed for anxiousness     risperiDONE (RISPERDAL) 0.5 MG tablet Take 0.25 mg by mouth 2 (two) times daily.     sennosides-docusate sodium (SENOKOT-S) 8.6-50 MG tablet Take 2 tablets by mouth at bedtime as needed for constipation.     simvastatin (ZOCOR) 10 MG tablet Take 10 mg by mouth at bedtime.     tiotropium (SPIRIVA HANDIHALER) 18 MCG inhalation capsule INHALE CONTENTS OF ONE CAPSULE ONCE DAILY FOR COPD. (Patient taking differently: Place 18 mcg into inhaler and inhale See admin instructions. INHALE CONTENTS OF ONE CAPSULE (18 mcg) into the lungs ONCE DAILY FOR COPD) 30 capsule 5   triamcinolone (KENALOG) 0.1 % Apply 1 application. topically daily as needed (psoriasis).     cholecalciferol (VITAMIN D) 1000 units tablet Take 1 tablet (1,000 Units total) by mouth daily. (Patient not taking: Reported on 11/04/2021)     LORazepam (LORAZEPAM INTENSOL) 2 MG/ML concentrated solution 0.5 mg/ml gel apply to skin twice a day as needed (Patient not  taking: Reported on 11/04/2021) 60 mL 0   pantoprazole (PROTONIX) 40 MG tablet Take 40 mg by mouth daily.      Musculoskeletal:  Strength & Muscle Tone: decreased Gait & Station:  unable to fully assess; pt in bed Patient leans: Backward   Psychiatric Specialty Exam: Presentation  General Appearance: Casual Eye Contact:Good Speech:Clear and Coherent Speech Volume:Normal Handedness:No data recorded  Mood and Affect  Mood:Euthymic Affect:Depressed; Congruent  Thought Process  Thought Processes:Coherent Descriptions of Associations:Intact  Orientation:Partial  Thought Content:Logical  History of Schizophrenia/Schizoaffective disorder:No data recorded Duration of Psychotic Symptoms:No data recorded Hallucinations:Hallucinations: None  Ideas of Reference:None  Suicidal Thoughts:Suicidal Thoughts: Yes, Passive SI Passive Intent and/or Plan: Without Intent; Without Plan  Homicidal Thoughts:Homicidal Thoughts: No   Sensorium  Memory:Immediate Fair; Recent Fair; Remote Fair Judgment:Fair Insight:Present; Fair  Community education officer  Concentration:Fair Attention Span:Fair [place] of Knowledge:Good Language:Fair  Psychomotor Activity  Psychomotor Activity:Psychomotor Activity: Normal  Assets  Assets:Housing; Resilience; Social Support; Vocational/Educational; Armed forces logistics/support/administrative officer; Financial Resources/Insurance   Sleep  Sleep:Sleep: Poor  Physical Exam: Physical Exam Vitals and nursing note reviewed.  Constitutional:      Appearance: She is normal weight.  HENT:     Head: Normocephalic.     Nose: Nose normal.     Mouth/Throat:     Mouth: Mucous membranes are moist.     Pharynx: Oropharynx is clear.  Eyes:     Pupils: Pupils are equal, round, and reactive to light.  Cardiovascular:     Rate and Rhythm: Normal rate.     Pulses: Normal pulses.  Pulmonary:     Effort: Pulmonary effort is normal.  Abdominal:     Palpations: Abdomen is soft.   Musculoskeletal:        General: Normal range of motion.     Cervical back: Normal range of motion.  Skin:    General: Skin is warm and dry.  Neurological:     Mental Status: She is alert and oriented to person, place, and time.  Psychiatric:        Attention and Perception: Attention and perception normal. She does not perceive auditory or visual hallucinations.        Mood and Affect: Affect normal. Mood is depressed.        Speech: Speech normal.        Behavior: Behavior normal. Behavior is cooperative.        Thought Content: Thought content is not paranoid or delusional. Thought content does not include homicidal ideation. Thought content does not include homicidal or suicidal plan.        Cognition and Memory: Cognition normal. Memory is impaired.        Judgment: Judgment normal.    Review of Systems  Psychiatric/Behavioral:  Positive for depression and memory loss.   All other systems reviewed and are negative.  Blood pressure (!) 133/103, pulse 73, temperature 97.6 F (36.4 C), temperature source Oral, resp. rate 16, height '5\' 6"'$  (1.676 m), last menstrual period 02/16/1975, SpO2 94 %. Body mass index is 23.89 kg/m.  Medical Decision Making: Patient is 88 year old female with past history of dementia and recent ORIF who presented to [facility] for cognitive and mood changes and refusing care at [facility]. Since presenting to ED pt has been medication compliant and engaged in care. No s/s of delusional thinking or hallucinations noted. Patient currently lives in skilled nursing facility that manages patient's care 24/7 including medication administration. Does not meet inpatient psychiatric criteria at this time. Patient to be psych cleared to follow up with facility provider.   Problem 1: Major Depressive Disorder: Escitalopram (Lexapro) '10mg'$  daily, Lamotrigine (Lamictal) 150 mg  BID Recommend pt follow up with outpatient therapy to address depression. [facility] information  provided in AVS for follow-up.   Problem 2: Dementia: Memantine (Namenda) 10 mg daily to address cognitive and memory changes.  Patient lives in SNF with 24 hour care.   Problem 3: Cognitive and behavioral changes: UA outstanding to rule out any underlying medical causes. Continue Oxycodone IR 5 mg daily to address any underlying pain. Depakote 125 mg BID to address mood fluctuations.  Disposition: No evidence of imminent risk to self or others at present.   Patient does not meet criteria for psychiatric inpatient admission. Supportive therapy provided about ongoing stressors. Discussed crisis plan, support from social network, calling 911, coming to the Emergency Department, and calling Suicide Hotline.  Inda Merlin, NP 11/04/2021 6:35 PM

## 2021-11-04 NOTE — ED Notes (Signed)
Unsuccessful attempt of urine collection with in and out catheter.

## 2021-11-04 NOTE — Assessment & Plan Note (Signed)
Blood pressure is controlled, off Amlodipine.
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
## 2021-11-04 NOTE — Assessment & Plan Note (Signed)
Stable, continue Senokot.  

## 2021-11-04 NOTE — Assessment & Plan Note (Signed)
reported the patient's hallucination, not opening eyes. The patient was seeing during meal, responded briefly in low voice with eye contacts. Saw Dr. Lyndel Safe 10/30/21 for paranoia behaviors/refusing care since the left hip fx surgery. Risperdal was reduced 2/2 tremors. Also taking Lexaporo. Depakote was added 10/30/21 for behavioral/outburst management. Neagtive CT scan. Pending Neurology consultation.  Will update CBC/diff, CMP/eGFR. Observe.

## 2021-11-04 NOTE — Consult Note (Signed)
Urology Consult   Reason for consult: Urinary retention, difficult catheter placement  History of Present Illness: [name] is a 88 y.o. who was brought to the emergency department today with a past medical history of major depressive disorder, COPD, essential tremor, restless leg syndrome, seizure disorder who presents after being brought to the ED with concerns of hallucinations.  While she was in the emergency department, she was unable to void.  Nursing staff attempted to place multiple Foley catheters without success.  Urology was consulted to place Foley catheter.  Patient states that she was last able to void yesterday.  Of note, she is a poor historian.  She does note that she had a fair to slower stream.  She states she only urinates a few times a day.  She denies fevers.  She denies prior urology history.  Past Medical History:  Diagnosis Date   Acute bronchitis 05/23/2011   Acute upper respiratory infections of unspecified site 05/23/2011   Arthritis    Chronic airway obstruction, not elsewhere classified 05/23/2011   Disturbance of salivary secretion 01/31/2011   Dizziness and giddiness 01/31/2011   Dyspnea    Essential tremor 04/25/2014   External hemorrhoids without mention of complication 42/35/3614   Gait disorder 04/25/2014   Insomnia, unspecified 09/12/2011   Lumbago 01/31/2011   Major depressive disorder, single episode, unspecified 01/31/2011   Memory disorder 04/25/2014   Mitral valve disorders(424.0) 01/31/2011   Other and unspecified hyperlipidemia 01/31/2011   Other convulsions 01/31/2011   Other emphysema ([place]) 01/31/2011   Pain in joint, site unspecified 01/31/2011   Restless legs syndrome (RLS) 09/12/2011   Retinal detachment with retinal defect of right eye 2011   right eye twice   Seizure disorder ([place])    Senile osteoporosis 01/31/2011   Spontaneous ecchymoses 01/31/2011   Stiffness of joints, not elsewhere classified, multiple sites 01/31/2011    Unspecified essential hypertension 01/31/2011    Past Surgical History:  Procedure Laterality Date   ABDOMINAL HYSTERECTOMY  06/21/2003   TAH/BSO, omenectomy PSB resect, Stg IC cystadenofibroma   CHOLECYSTECTOMY  2005   Dr. Marlou Starks   ELBOW SURGERY Right 2008   broken   Dr. Apolonio Schneiders   EYE SURGERY     INTRAMEDULLARY (IM) NAIL INTERTROCHANTERIC Left 08/30/2021   Procedure: INTRAMEDULLARY (IM) NAIL INTERTROCHANTRIC;  Surgeon: Rod Can, MD;  Location: [facility];  Service: Orthopedics;  Laterality: Left;   ORIF PATELLA Left 05/02/2020   Procedure: OPEN REDUCTION INTERNAL (ORIF) FIXATION LEFT PATELLA WITH MEDIAL AND LATERAL LIGAMENT REINFORCEMENTS;  Surgeon: [name], MD;  Location: [facility];  Service: Orthopedics;  Laterality: Left;   ORIF PATELLA Left 05/30/2020   Procedure: OPEN REDUCTION INTERNAL (ORIF) FIXATION PATELLA;  Surgeon: [name], MD;  Location: [facility];  Service: Orthopedics;  Laterality: Left;   RETINAL DETACHMENT SURGERY N/A    two   REVERSE SHOULDER ARTHROPLASTY Left 05/06/2019   Procedure: REVERSE SHOULDER ARTHROPLASTY;  Surgeon: Justice Britain, MD;  Location: [facility];  Service: Orthopedics;  Laterality: Left;  164mn   ROTATOR CUFF REPAIR Right 2012   Dr. CTheda Sers  SQUAMOUS CELL CARCINOMA EXCISION Bilateral 2012, 8/14   [place] on legs   Dr. GSarajane Jews  TONSILLECTOMY  1941   VIDEO BRONCHOSCOPY WITH ENDOBRONCHIAL NAVIGATION N/A 11/29/2015   Procedure: VIDEO BRONCHOSCOPY WITH ENDOBRONCHIAL NAVIGATION;  Surgeon: RCollene Gobble MD;  [place]  Service: Thoracic;  Laterality: N/A;     Current Hospital Medications:  Home meds:  No current  facility-administered medications on file prior to encounter.   Current Outpatient Medications on File Prior to Encounter  Medication Sig Dispense Refill   acetaminophen (TYLENOL) 500 MG tablet Take 1,000 mg by mouth every 6 (six) hours as needed (for pain).     Albuterol Sulfate (PROAIR RESPICLICK) 353 (90 Base) MCG/ACT  AEPB Inhale 2 puffs into the lungs See admin instructions. Inhale 2 puffs into the lungs every morning as needed for shortness of breath or wheezing     Cholecalciferol (VITAMIN D3) 25 MCG (1000 UT) CAPS Take 1,000 Units by mouth daily.     divalproex (DEPAKOTE SPRINKLE) 125 MG capsule Take 125 mg by mouth 2 (two) times daily.     escitalopram (LEXAPRO) 10 MG tablet Take 10 mg by mouth daily.     hydrocortisone 2.5 % lotion Apply 1 application  topically 2 (two) times daily as needed (to scaly areas of face and ears).     hydrocortisone cream 1 % Apply 1 application  topically 2 (two) times daily as needed for itching (to scaly areas of face and ears).     ketoconazole (NIZORAL) 2 % cream Apply 1 application  topically daily as needed (to affected areas for psoriasis).     ketoconazole (NIZORAL) 2 % shampoo Apply 1 application  topically See admin instructions. Shampoo on Mondays and Thursdays     lamoTRIgine (LAMICTAL) 150 MG tablet Take 150 mg by mouth 2 (two) times daily.     levETIRAcetam (KEPPRA) 250 MG tablet Take 250 mg by mouth daily. In the morning     levETIRAcetam (KEPPRA) 500 MG tablet Take 500 mg by mouth at bedtime.     memantine (NAMENDA) 10 MG tablet TAKE 1 TABLET BY MOUTH TWICE DAILY. (Patient taking differently: Take 10 mg by mouth 2 (two) times daily.) 180 tablet 1   nystatin (MYCOSTATIN/NYSTOP) powder Apply 1 application  topically 2 (two) times daily as needed (urogenital area).     OxyCODONE HCl, Abuse Deter, (OXAYDO) 5 MG TABA Take 5 mg by mouth every 6 (six) hours as needed.  Nondisplaced transverse fracture of left patella, subsequent encounter for closed fracture with routine healing (Patient taking differently: Take 5 mg by mouth every 6 (six) hours as needed (for severe pain).) 60 tablet 0   pramipexole (MIRAPEX) 0.5 MG tablet Take 0.5 mg by mouth at bedtime.     PRESCRIPTION MEDICATION Apply 0.5 mg topically See admin instructions. Lorazepam 0.5 mg/ml gel: Apply 0.5 mg  topically two times a day as needed for anxiousness     risperiDONE (RISPERDAL) 0.5 MG tablet Take 0.25 mg by mouth 2 (two) times daily.     sennosides-docusate sodium (SENOKOT-S) 8.6-50 MG tablet Take 2 tablets by mouth at bedtime as needed for constipation.     simvastatin (ZOCOR) 10 MG tablet Take 10 mg by mouth at bedtime.     tiotropium (SPIRIVA HANDIHALER) 18 MCG inhalation capsule INHALE CONTENTS OF ONE CAPSULE ONCE DAILY FOR COPD. (Patient taking differently: Place 18 mcg into inhaler and inhale See admin instructions. INHALE CONTENTS OF ONE CAPSULE (18 mcg) into the lungs ONCE DAILY FOR COPD) 30 capsule 5   triamcinolone (KENALOG) 0.1 % Apply 1 application. topically daily as needed (psoriasis).     cholecalciferol (VITAMIN D) 1000 units tablet Take 1 tablet (1,000 Units total) by mouth daily. (Patient not taking: Reported on 11/04/2021)     LORazepam (LORAZEPAM INTENSOL) 2 MG/ML concentrated solution 0.5 mg/ml gel apply to skin twice a day as needed (  Patient not taking: Reported on 11/04/2021) 60 mL 0   pantoprazole (PROTONIX) 40 MG tablet Take 40 mg by mouth daily.       Scheduled Meds:  divalproex  125 mg Oral BID   escitalopram  10 mg Oral Daily   lamoTRIgine  150 mg Oral BID   levETIRAcetam  250 mg Oral Daily   levETIRAcetam  500 mg Oral QHS   memantine  10 mg Oral BID   pantoprazole  40 mg Oral Daily   pramipexole  0.5 mg Oral QHS   simvastatin  10 mg Oral QHS   umeclidinium bromide  1 puff Inhalation Daily   Continuous Infusions:  cefTRIAXone (ROCEPHIN)  IV     PRN Meds:.acetaminophen, albuterol, oxyCODONE, senna-docusate  Allergies:  Allergies  Allergen Reactions   Vioxx [Rofecoxib] Shortness Of Breath   Dyazide [Hydrochlorothiazide W-Triamterene] Other (See Comments)    Lowers blood pressure too much   Latex Swelling and Other (See Comments)    "Allergic," per [facility]   Sulfa Antibiotics Nausea And Vomiting and Other (See Comments)    "Allergic," per [facility]   Sumycin  [Tetracycline] Other (See Comments)    Can't take due to drug reaction- "Allergic," per [facility]    Family History  Problem Relation Age of Onset   Heart disease Father        CHF   Cancer Mother        breast   Seizures Sister     Social History:  reports that she quit smoking about 33 years ago. Her smoking use included cigarettes. She has a 40.00 pack-year smoking history. She has never used smokeless tobacco. She reports that she does not drink alcohol and does not use drugs.  ROS: A complete review of systems was performed.  All systems are negative except for pertinent findings as noted.  Physical Exam:  Vital signs in last 24 hours: Temp:  [97.5 F (36.4 C)-97.6 F (36.4 C)] 97.5 F (36.4 C) (07/30 2151) Pulse Rate:  [73-86] 86 (07/30 1930) Resp:  [16-18] 17 (07/30 1930) BP: (133-176)/(99-103) 176/99 (07/30 1930) SpO2:  [92 %-94 %] 93 % (07/30 1930) Constitutional:  Alert and oriented, No acute distress Cardiovascular: Regular rate and rhythm Respiratory: Normal respiratory effort, Lungs clear bilaterally GI: Abdomen is soft, nontender, suprapubic palpation with distended bladder GU: No CVA tenderness Neurologic: Grossly intact, no focal deficits Psychiatric: Normal mood and affect  Laboratory Data:  Recent Labs    11/04/21 1047  WBC 4.7  HGB 12.9  HCT 41.6  PLT 252    Recent Labs    11/04/21 1047  NA 145  K 3.2*  CL 109  GLUCOSE 80  BUN 16  CALCIUM 9.6  CREATININE 0.69     Results for orders placed or performed during the hospital encounter of 11/04/21 (from the past 24 hour(s))  Comprehensive metabolic panel     Status: Abnormal   Collection Time: 11/04/21 10:47 AM  Result Value Ref Range   Sodium 145 135 - 145 mmol/L   Potassium 3.2 (L) 3.5 - 5.1 mmol/L   Chloride 109 98 - 111 mmol/L   CO2 27 22 - 32 mmol/L   Glucose, Bld 80 70 - 99 mg/dL   BUN 16 8 - 23 mg/dL   Creatinine, Ser 0.69 0.44 - 1.00 mg/dL   Calcium 9.6 8.9 - 10.3 mg/dL   Total  Protein 7.0 6.5 - 8.1 g/dL   Albumin 3.6 3.5 - 5.0 g/dL   AST 20 15 -  41 U/L   ALT 12 0 - 44 U/L   Alkaline Phosphatase 157 (H) 38 - 126 U/L   Total Bilirubin 0.5 0.3 - 1.2 mg/dL   GFR, Estimated >60 >60 mL/min   Anion gap 9 5 - 15  Ethanol     Status: None   Collection Time: 11/04/21 10:47 AM  Result Value Ref Range   Alcohol, Ethyl (B) <10 <10 mg/dL  CBC with Diff     Status: Abnormal   Collection Time: 11/04/21 10:47 AM  Result Value Ref Range   WBC 4.7 4.0 - 10.5 K/uL   RBC 4.97 3.87 - 5.11 MIL/uL   Hemoglobin 12.9 12.0 - 15.0 g/dL   HCT 41.6 36.0 - 46.0 %   MCV 83.7 80.0 - 100.0 fL   MCH 26.0 26.0 - 34.0 pg   MCHC 31.0 30.0 - 36.0 g/dL   RDW 16.3 (H) 11.5 - 15.5 %   Platelets 252 150 - 400 K/uL   nRBC 0.0 0.0 - 0.2 %   Neutrophils Relative % 68 %   Neutro Abs 3.2 1.7 - 7.7 K/uL   Lymphocytes Relative 16 %   Lymphs Abs 0.7 0.7 - 4.0 K/uL   Monocytes Relative 13 %   Monocytes Absolute 0.6 0.1 - 1.0 K/uL   Eosinophils Relative 2 %   Eosinophils Absolute 0.1 0.0 - 0.5 K/uL   Basophils Relative 1 %   Basophils Absolute 0.0 0.0 - 0.1 K/uL   Immature Granulocytes 0 %   Abs Immature Granulocytes 0.02 0.00 - 0.07 K/uL  Valproic acid level     Status: Abnormal   Collection Time: 11/04/21 10:47 AM  Result Value Ref Range   Valproic Acid Lvl 11 (L) 50.0 - 100.0 ug/mL  Resp Panel by RT-PCR (Flu A&B, Covid) Anterior Nasal Swab     Status: None   Collection Time: 11/04/21 11:55 AM   Specimen: Anterior Nasal Swab  Result Value Ref Range   SARS Coronavirus 2 by RT PCR NEGATIVE NEGATIVE   Influenza A by PCR NEGATIVE NEGATIVE   Influenza B by PCR NEGATIVE NEGATIVE   Recent Results (from the past 240 hour(s))  Resp Panel by RT-PCR (Flu A&B, Covid) Anterior Nasal Swab     Status: None   Collection Time: 11/04/21 11:55 AM   Specimen: Anterior Nasal Swab  Result Value Ref Range Status   SARS Coronavirus 2 by RT PCR NEGATIVE NEGATIVE Final    Comment: (NOTE) SARS-CoV-2 target  nucleic acids are NOT DETECTED.  The SARS-CoV-2 RNA is generally detectable in upper respiratory specimens during the acute phase of infection. The lowest concentration of SARS-CoV-2 viral copies this assay can detect is 138 copies/mL. A negative result does not preclude SARS-Cov-2 infection and should not be used as the sole basis for treatment or other patient management decisions. A negative result may occur with  improper specimen collection/handling, submission of specimen other than nasopharyngeal swab, presence of viral mutation(s) within the areas targeted by this assay, and inadequate number of viral copies(<138 copies/mL). A negative result must be combined with clinical observations, patient history, and epidemiological information. The expected result is Negative.  Fact Sheet for Patients:  EntrepreneurPulse.com.au  Fact Sheet for Healthcare Providers:  IncredibleEmployment.be  This test is no t yet approved or cleared by the [place] FDA and  has been authorized for detection and/or diagnosis of SARS-CoV-2 by FDA under an Emergency Use Authorization (EUA). This EUA will remain  in effect (meaning this test can be  used) for the duration of the COVID-19 declaration under Section 564(b)(1) of the Act, 21 U.S.C.section 360bbb-3(b)(1), unless the authorization is terminated  or revoked sooner.       Influenza A by PCR NEGATIVE NEGATIVE Final   Influenza B by PCR NEGATIVE NEGATIVE Final    Comment: (NOTE) The Xpert Xpress SARS-CoV-2/FLU/RSV plus assay is intended as an aid in the diagnosis of influenza from Nasopharyngeal swab specimens and should not be used as a sole basis for treatment. Nasal washings and aspirates are unacceptable for Xpert Xpress SARS-CoV-2/FLU/RSV testing.  Fact Sheet for Patients: EntrepreneurPulse.com.au  Fact Sheet for Healthcare  Providers: IncredibleEmployment.be  This test is not yet approved or cleared by the [place] FDA and has been authorized for detection and/or diagnosis of SARS-CoV-2 by FDA under an Emergency Use Authorization (EUA). This EUA will remain in effect (meaning this test can be used) for the duration of the COVID-19 declaration under Section 564(b)(1) of the Act, 21 U.S.C. section 360bbb-3(b)(1), unless the authorization is terminated or revoked.  Performed at [facility], [place] [address], [place], [place] [address]     Renal Function: Recent Labs    11/04/21 1047  CREATININE 0.69   Estimated Creatinine Clearance: 47.3 mL/min (by C-G formula based on SCr of 0.69 mg/dL).  Radiologic Imaging: DG Chest 2 View  Result Date: 11/04/2021 CLINICAL DATA:  Medical clearance.  Visual hallucinations. EXAM: CHEST - 2 VIEW COMPARISON:  Aug 29, 2021 FINDINGS: Platelike opacities in the bases consistent with atelectasis. Small left pleural effusion versus pleural thickening. The cardiomediastinal silhouette is stable. No pneumothorax. No other acute abnormalities. IMPRESSION: Small left pleural effusion versus pleural thickening. Bibasilar atelectasis. Electronically Signed   By: Dorise Bullion III M.D.   On: 11/04/2021 12:00    I independently reviewed the above imaging studies. Procedures: Under sterile conditions, her genitalia were prepped and draped.  I advanced a cystoscope into her introitus were identified at her urethra.  Of note, this was blind-ending.  I attempted to pass a 0.038 sensor wire however I was unable to identify a true lumen.  This would not be go beyond the wall of tissue. Next, I prepped and draped her lower abdomen and suprapubic area. I performed bedside ultrasound that demonstrated distended bladder. I anesthetized her suprapubic area with lidocaine. I advanced a spinal needle 2 cm superior to her pubis with return of clear yellow  urine. I made a 1 cm incision. Next, I introduced the Cook suprapubic tube over the stylette and into the bladder.  I removed the obturator.  There was return of clear yellow urine.  I then deployed the suprapubic tube ensuring a curl within the bladder.  This was visualized directly with ultrasound.  I then secured the suprapubic tube to her skin with a 2-0 nylon suture.  This was then secured to a StatLock.  There is initial return of over 1 L of clear yellow urine.  Patient tolerated the procedure well.  There were no complications.  Impression/Recommendation: Acute urinary retention  -She had a narrow introitus and I was unable to visualize her urethra initially.  I attempted passing a Foley catheter unsuccessfully.  I then introduced a cystoscope and identified her urethra.  This was blind ending after about a centimeter.  I was unable to pass a wire beyond this blind-ending urethra. -Bedside ultrasound demonstrated distended bladder. -I placed a 14 French Cook suprapubic tube catheter with return of over 1 L of clear yellow urine. -Patient  tolerated well without difficulty. -She may discharge home from urologic perspective. -I will arrange for follow-up in my office.  She will require her initial suprapubic tube change our office in about 6 weeks.  Matt R. Ryker [place] MD 11/04/2021, 10:13 PM  [facility]  Pager: [phone]

## 2021-11-04 NOTE — Assessment & Plan Note (Signed)
trace, prn Furosemide,  Bun/creat 23/0.9 05/22/21

## 2021-11-04 NOTE — ED Triage Notes (Signed)
Pt. BIB [facility] from [facility]. Per the nursing home Pt. Was brought in for visual hallucinations, holding urine and refusing to eat and take medications. Per EMS Pt. Has been compliant and AxOx4.

## 2021-11-04 NOTE — Assessment & Plan Note (Signed)
stable, on Keppra, Lamictal

## 2021-11-04 NOTE — BH Assessment (Signed)
Comprehensive Clinical Assessment (CCA) Note  11/04/2021 [name] [id number]  Discharge Disposition: Evette Georges, NP, reviewed pt's chart and information and determined pt should receive continuous assessment and be re-assessed by psychiatry in the morning. This information was relayed to pt's team at 2328.  The patient demonstrates the following risk factors for suicide: Chronic risk factors for suicide include: psychiatric disorder of MDD, single episode, moderate  and medical illness of dementia . Acute risk factors for suicide include: N/A. Protective factors for this patient include: positive social support, positive therapeutic relationship, and coping skills. Considering these factors, the overall suicide risk at this point appears to be none. Patient is not appropriate for outpatient follow up.  Therefore, no sitter is recommended for suicide precautions.  Flowsheet Row ED from 11/04/2021 in [place] DEPT ED to Hosp-Admission (Discharged) from 08/29/2021 in [place] Admission (Discharged) from 05/30/2020 in [place] No Risk No Risk No Risk     Chief Complaint:  Chief Complaint  Patient presents with   Medical Clearance   Hallucinations   Paranoid   Visit Diagnosis: MDD, single episode, moderate  CCA Screening, Triage and Referral (STR) Mayo 'Martha Ro' Bentley is an 88 year old patient who was brought to [facility] via EMS from [facility], where she is a resident. When asked why she was brought to the hospital, pt states, "The floor nurse called the doctor and wanted me to be exampined because I was acting strangely. I've been having blackouts (for about a month) and I don't know what goes on during that time." When asked if she believes she has been acting strangely, pt states she is unsure.  Pt endorses SI, though she denies any prior attempts to kill herself, a plan to kill  herself, or any prior hospitalizations. Pt denies HI, AVH, NSSIB, access to guns/weapons, engagement with the legal system, or SA.  Pt is oriented x5. Her recent/remote memory is intact. Pt was cooperative throughout the assessment process. Pt's insight, judgement, and impulse control is fair at this time.  Patient Reported Information How did you hear about [place]? Other (Comment) (EDP)  What Is the Reason for Your Visit/Call Today? Pt states, "The floor nurse called the doctor and wanted me to be exampined because I was acting strangely. I've been havign blackouts (for about a month) and I don't know what goes on during that time." Pt endorses SI, though she denies any prior attempts to kill herself, a plan to kill herself, or any prior hospitalizations. Pt denies HI, AVH, NSSIB, access to guns/weapons, engagement with the legal system, or SA.  How Long Has This Been Causing You Problems? 1 wk - 1 month  What Do You Feel Would Help You the Most Today? Medication(s)   Have You Recently Had Any Thoughts About Hurting Yourself? Yes  Are You Planning to Commit Suicide/Harm Yourself At This time? No   Have you Recently Had Thoughts About [place]? No  Are You Planning to Harm Someone at This Time? No  Explanation: No data recorded  Have You Used Any Alcohol or Drugs in the Past 24 Hours? No  How Long Ago Did You Use Drugs or Alcohol? No data recorded What Did You Use and How Much? No data recorded  Do You Currently Have a Therapist/Psychiatrist? No  Name of Therapist/Psychiatrist: No data recorded  Have You Been Recently Discharged From Any Office Practice or Programs? No  Explanation of  Discharge From Practice/Program: No data recorded    CCA Screening Triage Referral Assessment Type of Contact: Tele-Assessment  Telemedicine Service Delivery: Telemedicine service delivery: This service was provided via telemedicine using a 2-way, interactive audio and video  technology  Is this Initial or Reassessment? Initial Assessment  Date Telepsych consult ordered in [facility]:  11/04/21  Time Telepsych consult ordered in [facility]:  1302  Location of Assessment: [facility] ED  Provider Location: [facility] Assessment Services   Collateral Involvement: Pt provided verbal consent for clinician to make contact with her sister, Inez Catalina, though she was unable to provide the new number to make contat with her sister and clinician was unable to find the new number in pt's chart.   Does Patient Have a Stage manager Guardian? No data recorded Name and Contact of Legal Guardian: No data recorded If Minor and Not Living with Parent(s), Who has Custody? N/A  Is CPS involved or ever been involved? Never  Is APS involved or ever been involved? Never   Patient Determined To Be At Risk for Harm To Self or Others Based on Review of Patient Reported Information or Presenting Complaint? No  Method: No data recorded Availability of Means: No data recorded Intent: No data recorded Notification Required: No data recorded Additional Information for Danger to Others Potential: No data recorded Additional Comments for Danger to Others Potential: No data recorded Are There Guns or Other Weapons in Your Home? No data recorded Types of Guns/Weapons: No data recorded Are These Weapons Safely Secured?                            No data recorded Who Could Verify You Are Able To Have These Secured: No data recorded Do You Have any Outstanding Charges, Pending Court Dates, Parole/Probation? No data recorded Contacted To Inform of Risk of Harm To Self or Others: -- (N/A)    Does Patient Present under Involuntary Commitment? No  IVC Papers Initial File Date: No data recorded  [place] of Residence: [name]   Patient Currently Receiving the Following Services: Not Receiving Services   Determination of Need: Urgent (48 hours)   Options For Referral: Outpatient Therapy; Medication  Management     CCA Biopsychosocial Patient Reported Schizophrenia/Schizoaffective Diagnosis in Past: No   Strengths: Pt is able to answer the questions posed. She is able to express her thoughts, feelings, and concerns.   Mental Health Symptoms Depression:   Change in energy/activity; Increase/decrease in appetite; Sleep (too much or little)   Duration of Depressive symptoms:  Duration of Depressive Symptoms: Greater than two weeks   Mania:   None   Anxiety:    None   Psychosis:   Hallucinations   Duration of Psychotic symptoms:  Duration of Psychotic Symptoms: Less than six months   Trauma:   None   Obsessions:   None   Compulsions:   None   Inattention:   None   Hyperactivity/Impulsivity:   None   Oppositional/Defiant Behaviors:   None   Emotional Irregularity:   None   Other Mood/Personality Symptoms:   None noted    Mental Status Exam Appearance and self-care  Stature:   Average   Weight:   Average weight   Clothing:   -- [facility] gown)   Grooming:   Normal   Cosmetic use:   None   Posture/gait:   Normal   Motor activity:   Not Remarkable   Sensorium  Attention:  Normal   Concentration:   Normal   Orientation:   X5   Recall/memory:   Normal   Affect and Mood  Affect:   Appropriate   Mood:   Euthymic   Relating  Eye contact:   Normal   Facial expression:   Responsive   Attitude toward examiner:   Cooperative   Thought and Language  Speech flow:  Clear and Coherent   Thought content:   Appropriate to Mood and Circumstances   Preoccupation:   None   Hallucinations:   -- (Pt denies, though it was reported that she's been experiencing AH)   Organization:  No data recorded  [employer] of Knowledge:   Good   Intelligence:   Above Average   Abstraction:   Functional   Judgement:   Fair   Reality Testing:   Adequate   Insight:   Good   Decision Making:    Confused   Social Functioning  Social Maturity:   Responsible   Social Judgement:   Normal   Stress  Stressors:   Illness   Coping Ability:   Normal   Skill Deficits:   None   Supports:   Family; Friends/Service system     Religion: Religion/Spirituality Are You A Religious Person?:  (Not assessed) How Might This Affect Treatment?: Not assessed  Leisure/Recreation: Leisure / Recreation Do You Have Hobbies?:  (Not assessed)  Exercise/Diet: Exercise/Diet Do You Exercise?:  (Not assessed) Have You Gained or Lost A Significant Amount of Weight in the Past Six Months?:  (Not assessed) Do You Follow a Special Diet?: No (Pt shares her appetite has been reduced due to acid reflux) Do You Have Any Trouble Sleeping?: Yes Explanation of Sleeping Difficulties: Pt shares she has been prescribed medication to assist with her sleeping difficulties.   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, government has Been Impacted by Current Illness: No Has Patient ever Been in Passenger transport manager?: No  Education: Education Is Patient Currently Attending School?: No Last Grade Completed: 24 Did You Nutritional therapist?: Yes What Type of College Degree Do you Have?: Pt has a Master's degree in Special Education Did You Have An Individualized Education Program (IIEP): No Did You Have Any Difficulty At [employer]?: No Patient's Education Has Been Impacted by Current Illness: No   CCA Family/Childhood History Family and Relationship History: Family history Marital status: Divorced Divorced, when?: 1958 What types of issues is patient dealing with in the relationship?: Not assessed Additional relationship information: Not assessed Does patient have children?: No  Childhood History:  Childhood History By whom was/is the patient raised?: Both parents Did patient suffer any verbal/emotional/physical/sexual abuse as a  child?: No Did patient suffer from severe childhood neglect?: No Has patient ever been sexually abused/assaulted/raped as an adolescent or adult?: No Was the patient ever a victim of a crime or a disaster?: No Witnessed domestic violence?: No Has patient been affected by domestic violence as an adult?: No  Child/Adolescent Assessment:     CCA Substance Use Alcohol/Drug Use: Alcohol / Drug Use Pain Medications: See MAR Prescriptions: See MAR Over the Counter: See MAR History of alcohol / drug use?: No history of alcohol / drug abuse Longest period of sobriety (when/how long): N/A Negative Consequences of Use:  (N/A) Withdrawal Symptoms:  (N/A)                         ASAM's:  Six Dimensions  of Multidimensional Assessment  Dimension 1:  Acute Intoxication and/or Withdrawal Potential:      Dimension 2:  Biomedical Conditions and Complications:      Dimension 3:  Emotional, Behavioral, or Cognitive Conditions and Complications:     Dimension 4:  Readiness to Change:     Dimension 5:  Relapse, Continued use, or Continued Problem Potential:     Dimension 6:  Recovery/Living Environment:     ASAM Severity Score:    ASAM Recommended Level of Treatment: ASAM Recommended Level of Treatment:  (N/A)   Substance use Disorder (SUD) Substance Use Disorder (SUD)  Checklist Symptoms of Substance Use:  (N/A)  Recommendations for Services/Supports/Treatments: Recommendations for Services/Supports/Treatments Recommendations For Services/Supports/Treatments: Individual Therapy, Medication Management, Other (Comment) (Continuous Assessment at [facility])  Discharge Disposition: Evette Georges, NP, reviewed pt's chart and information and determined pt should receive continuous assessment and be re-assessed by psychiatry in the morning. This information was relayed to pt's team at 2328.  DSM5 Diagnoses: Patient Active Problem List   Diagnosis Date Noted   Major depressive disorder,  recurrent ([place]) 11/04/2021   Cognitive and behavioral changes 11/04/2021   Closed left hip fracture ([place]) 08/29/2021   Heart block AV first degree 08/29/2021   Skin irritation 07/12/2021   History of retinal detachment 02/20/2021   Fuchs' corneal dystrophy of both eyes 02/20/2021   Choroidal nevus of right eye 02/20/2021   Pseudophakia, both eyes 02/20/2021   Dry skin dermatitis 01/29/2021   COVID-19 virus infection 09/25/2020   GERD (gastroesophageal reflux disease) 04/28/2020   Nondisplaced transverse fracture of left patella, subsequent encounter for closed fracture with nonunion 04/28/2020   Generalized osteoarthritis of multiple sites 10/05/2019   Olecranon bursitis of right elbow 10/04/2019   PAD (peripheral artery disease) ([place]) 06/23/2019   Slow transit constipation 05/21/2019   Generalized weakness 05/19/2019   Vasovagal near syncope 05/19/2019   History of urinary retention 05/19/2019   Mandibular fracture, closed ([place]) 05/19/2019   Humerus fracture 05/04/2019   Right thyroid nodule 02/28/2016   Abnormal thyroid uptake 11/10/2015   Coronary artery calcification 12/05/2014   Panlobular emphysema ([place]) 07/25/2014   Dyspnea and respiratory abnormality 07/25/2014   Multiple lung nodules on CT 07/25/2014   Smoking history 07/25/2014   Pain of right lower leg 05/23/2014   Fall at home, initial encounter 05/23/2014   Gait disorder 04/25/2014   Senile dementia ([place]) 04/25/2014   Essential tremor 04/25/2014   Advanced directives, counseling/discussion 02/18/2013   Seizure disorder ([place]) 10/15/2012   Cancer of skin, squamous cell 10/15/2012   Unspecified vitamin D deficiency 10/15/2012   Hyperlipidemia 10/15/2012   Edema 09/10/2012   Hordeolum externum (stye) 09/10/2012   Restless legs syndrome (RLS) 09/12/2011   Insomnia, unspecified 09/12/2011   COPD (chronic obstructive pulmonary disease) ([place]) 05/23/2011   Essential hypertension 01/31/2011   Senile osteoporosis  01/31/2011   Depression, psychotic ([place]) 01/31/2011     Referrals to Alternative Service(s): Referred to Alternative Service(s):   Place:   Date:   Time:    Referred to Alternative Service(s):   Place:   Date:   Time:    Referred to Alternative Service(s):   Place:   Date:   Time:    Referred to Alternative Service(s):   Place:   Date:   Time:     Dannielle Burn, LMFT

## 2021-11-04 NOTE — ED Provider Notes (Signed)
  Physical Exam  BP (!) 176/99   Pulse 86   Temp (!) 97.5 F (36.4 C) (Oral)   Resp 17   Ht 5\' 6"  (1.676 m)   LMP 02/16/1975   SpO2 93%   BMI 23.89 kg/m   Physical Exam  Results   Results for orders placed or performed during the hospital encounter of 11/04/21  Urine rapid drug screen (hosp performed)  Result Value Ref Range   Opiates NONE DETECTED NONE DETECTED   Cocaine NONE DETECTED NONE DETECTED   Benzodiazepines NONE DETECTED NONE DETECTED   Amphetamines NONE DETECTED NONE DETECTED   Tetrahydrocannabinol NONE DETECTED NONE DETECTED   Barbiturates NONE DETECTED NONE DETECTED  Urinalysis, Routine w reflex microscopic  Result Value Ref Range   Color, Urine YELLOW YELLOW   APPearance CLEAR CLEAR   Specific Gravity, Urine 1.014 1.005 - 1.030   pH 6.0 5.0 - 8.0   Glucose, UA NEGATIVE NEGATIVE mg/dL   Hgb urine dipstick MODERATE (A) NEGATIVE   Bilirubin Urine NEGATIVE NEGATIVE   Ketones, ur 20 (A) NEGATIVE mg/dL   Protein, ur NEGATIVE NEGATIVE mg/dL   Nitrite NEGATIVE NEGATIVE   Leukocytes,Ua NEGATIVE NEGATIVE   RBC / HPF 21-50 0 - 5 RBC/hpf   WBC, UA 0-5 0 - 5 WBC/hpf   Bacteria, UA NONE SEEN NONE SEEN   Mucus PRESENT    ED Course / MDM   Clinical Course as of 11/04/21 2247  Sun Nov 04, 2021  1735 Contacted nursing staff to request catheterization as patient had not yet urinated [LR]  1854 Notified by nursing staff that in-and-out cath was unsuccessful [LR]  1942 Bladder scan ~ 645 mL [LR]  2202 Urine collected via suprapubic catheter placed Dr Cardell Peach with urology [LR]    Clinical Course User Index [LR] Callaghan Laverdure, Lora Paula, PA-C   Medical Decision Making Amount and/or Complexity of Data Reviewed Labs: ordered. Radiology: ordered.  Risk OTC drugs. Prescription drug management.  Accepted handoff at shift change from [facility]. Please see prior provider note for full HPI.  Briefly: Patient is 88 year old female who presents to the ER for hallucinations and  paranoia.  DDX/Plan: Plan at time of shift change was to contact TTS, and follow up on patient's urinalysis prior to medical clearance for geripsych evaluation.  Urinalysis negative for UTI. Urine drug screen negative. Patient is medically cleared for psychiatric evaluation. Behavioral health team notified.   After psychiatric disposition, patient to be discharged with recommendation for follow up with Dr Cardell Peach with [facility] for urinary retention and difficulty catheter placement.    Jeanella Flattery 11/04/21 2248    Pricilla Loveless, MD 11/05/21 [phone]

## 2021-11-04 NOTE — Assessment & Plan Note (Signed)
takes MiraPex

## 2021-11-04 NOTE — Assessment & Plan Note (Signed)
OA in general, on Tylenol, Oxycodone. S/p ORIF left hip 08/29/21, TDWB due to complicated healing. A closed nondisplaced transverse fracture left patella 04/26/20, f/u Ortho s/p  ORIF.

## 2021-11-04 NOTE — Assessment & Plan Note (Signed)
stopped Foxamax per her request. takes Vit D.

## 2021-11-05 DIAGNOSIS — R4189 Other symptoms and signs involving cognitive functions and awareness: Secondary | ICD-10-CM

## 2021-11-05 DIAGNOSIS — R4689 Other symptoms and signs involving appearance and behavior: Secondary | ICD-10-CM

## 2021-11-05 DIAGNOSIS — Z7401 Bed confinement status: Secondary | ICD-10-CM | POA: Diagnosis not present

## 2021-11-05 DIAGNOSIS — R442 Other hallucinations: Secondary | ICD-10-CM | POA: Diagnosis not present

## 2021-11-05 DIAGNOSIS — R4182 Altered mental status, unspecified: Secondary | ICD-10-CM | POA: Diagnosis not present

## 2021-11-05 MED ORDER — SODIUM CHLORIDE 0.9 % IV BOLUS
500.0000 mL | Freq: Once | INTRAVENOUS | Status: AC
  Administered 2021-11-05: 500 mL via INTRAVENOUS

## 2021-11-05 MED ORDER — SODIUM CHLORIDE 0.9 % IV BOLUS
1000.0000 mL | Freq: Once | INTRAVENOUS | Status: AC
  Administered 2021-11-05: 1000 mL via INTRAVENOUS

## 2021-11-05 NOTE — ED Notes (Signed)
Pt Bps are low. EDP, Eulis Foster made aware. Pt a/o at baseline. Pt reports she feels tired but other than that feels fine.

## 2021-11-05 NOTE — Progress Notes (Signed)
TOC CSW received consult in regards to pt being d/c back to facility. CSW contact pt's facility and spoke with receptionist to inform them she is being d/c back to facility. Receptionist reported she will let everyone know. TOC sign off.   Arlie Solomons.[email or web address] Couillard, MSW, [place]  Transitions of Care Clinical Social Worker I Direct Dial: [phone]  Fax: [phone] [email or web address]

## 2021-11-05 NOTE — ED Provider Notes (Signed)
11:25 AM-nursing informed me that the patient's blood pressures have been low this morning.  She apparently received Rocephin earlier, and the visit, for unknown reasons.  Brief scan of work-up so far does not indicate any acute infectious processes.  We will give a saline bolus and reassess the patient.  Blood pressure improved when an appropriate sized cuff was placed on her.  She was given saline bolus just in case needed for dehydration.  There is no evidence for acute infectious process.  She is stable for discharge to follow-up with her PCP at her care facility.   Daleen Bo, MD 11/05/21 [phone]

## 2021-11-05 NOTE — BH Assessment (Signed)
[facility] Assessment Progress Note   Per Charmaine Downs, NP, this voluntary pt does not require psychiatric hospitalization at this time.  Pt is psychiatrically cleared.  No behavioral health referrals are indicated for the pt at this time.  A TOC consult has been ordered to address pt's psychosocial needs.  EDP Daleen Bo, MD and pt's nurse, Rolla Plate, have been notified.  Jalene Mullet, [place] Triage Specialist [phone]

## 2021-11-05 NOTE — ED Notes (Signed)
Per EDP. Orthostatics after fluid bolus and ambulate.

## 2021-11-05 NOTE — ED Notes (Addendum)
Per EDP pt cleared to return back to facility.   PTAR in dispatched.  Report given to Nurse at [facility] and agrees to receive pt back. Updated Nurse that pt needs to follow up with urologist due to urine cath and pcp.

## 2021-11-05 NOTE — NC FL2 (Deleted)
[place] LEVEL OF CARE SCREENING TOOL     IDENTIFICATION  Patient Name: [name] Birthdate: [birth year] Sex: female Admission Date (Current Location): 11/04/2021  [facility] and [place] Number:  Herbalist and Address:  [facility],  [place], [place]      Provider Number: [id number]  Attending Physician Name and Address:  Daleen Bo, MD  Relative Name and Phone Number:       Current Level of Care: Hospital Recommended Level of Care: [place] Prior Approval Number:    Date Approved/Denied:   PASRR Number:    Discharge Plan: Other (Comment) (Assisted living)    Current Diagnoses: Patient Active Problem List   Diagnosis Date Noted   Major depressive disorder, recurrent ([place]) 11/04/2021   Cognitive and behavioral changes 11/04/2021   Closed left hip fracture ([place]) 08/29/2021   Heart block AV first degree 08/29/2021   Skin irritation 07/12/2021   History of retinal detachment 02/20/2021   Fuchs' corneal dystrophy of both eyes 02/20/2021   Choroidal nevus of right eye 02/20/2021   Pseudophakia, both eyes 02/20/2021   Dry skin dermatitis 01/29/2021   COVID-19 virus infection 09/25/2020   GERD (gastroesophageal reflux disease) 04/28/2020   Nondisplaced transverse fracture of left patella, subsequent encounter for closed fracture with nonunion 04/28/2020   Generalized osteoarthritis of multiple sites 10/05/2019   Olecranon bursitis of right elbow 10/04/2019   PAD (peripheral artery disease) ([place]) 06/23/2019   Slow transit constipation 05/21/2019   Generalized weakness 05/19/2019   Vasovagal near syncope 05/19/2019   History of urinary retention 05/19/2019   Mandibular fracture, closed ([place]) 05/19/2019   Humerus fracture 05/04/2019   Right thyroid nodule 02/28/2016   Abnormal thyroid uptake 11/10/2015   Coronary artery calcification 12/05/2014   Panlobular emphysema ([place]) 07/25/2014    Dyspnea and respiratory abnormality 07/25/2014   Multiple lung nodules on CT 07/25/2014   Smoking history 07/25/2014   Pain of right lower leg 05/23/2014   Fall at home, initial encounter 05/23/2014   Gait disorder 04/25/2014   Senile dementia ([place]) 04/25/2014   Essential tremor 04/25/2014   Advanced directives, counseling/discussion 02/18/2013   Seizure disorder ([place]) 10/15/2012   Cancer of skin, squamous cell 10/15/2012   Unspecified vitamin D deficiency 10/15/2012   Hyperlipidemia 10/15/2012   Edema 09/10/2012   Hordeolum externum (stye) 09/10/2012   Restless legs syndrome (RLS) 09/12/2011   Insomnia, unspecified 09/12/2011   COPD (chronic obstructive pulmonary disease) ([place]) 05/23/2011   Essential hypertension 01/31/2011   Senile osteoporosis 01/31/2011   Depression, psychotic ([place]) 01/31/2011    Orientation RESPIRATION BLADDER Height & Weight     Self, Place  Normal External catheter Weight:   Height:  '5\' 6"'$  (167.6 cm)  BEHAVIORAL SYMPTOMS/MOOD NEUROLOGICAL BOWEL NUTRITION STATUS      Continent Diet (regular)  AMBULATORY STATUS COMMUNICATION OF NEEDS Skin   Limited Assist Verbally Normal                       Personal Care Assistance Level of Assistance  Bathing, Feeding, Dressing Bathing Assistance: Limited assistance Feeding assistance: Independent Dressing Assistance: Limited assistance     Functional Limitations Info  Sight, Hearing, Speech Sight Info: Adequate Hearing Info: Adequate Speech Info: Adequate    SPECIAL CARE FACTORS FREQUENCY                       Contractures Contractures Info: Not present  Additional Factors Info  Code Status, Allergies Code Status Info: full Allergies Info: Vioxx (rofecoxib),Dyazide (hydrochlorothiazide W-triamterene),Latex,Sulfa Antibiotics,Sumycin (tetracycline           Current Medications (11/05/2021):  This is the current hospital active medication list Current Facility-Administered Medications   Medication Dose Route Frequency Provider Last Rate Last Admin   acetaminophen (TYLENOL) tablet 1,000 mg  1,000 mg Oral Q6H PRN Jacqlyn Larsen, PA-C       albuterol (PROVENTIL) (2.5 MG/3ML) 0.083% nebulizer solution 2 mL  2 mL Inhalation Q6H PRN Benedetto Goad N, PA-C       divalproex (DEPAKOTE SPRINKLE) capsule 125 mg  125 mg Oral BID Jacqlyn Larsen, PA-C   125 mg at 11/05/21 0258   escitalopram (LEXAPRO) tablet 10 mg  10 mg Oral Daily Jacqlyn Larsen, [place]   10 mg at 11/05/21 5277   lamoTRIgine (LAMICTAL) tablet 150 mg  150 mg Oral BID Jacqlyn Larsen, PA-C   150 mg at 11/05/21 8242   levETIRAcetam (KEPPRA) tablet 250 mg  250 mg Oral Daily Jacqlyn Larsen, [place]   250 mg at 11/05/21 3536   levETIRAcetam (KEPPRA) tablet 500 mg  500 mg Oral QHS Benedetto Goad N, PA-C   500 mg at 11/04/21 2140   melatonin tablet 3 mg  3 mg Oral QHS Roemhildt, Lorin T, PA-C       memantine (NAMENDA) tablet 10 mg  10 mg Oral BID Benedetto Goad N, PA-C   10 mg at 11/05/21 1443   oxyCODONE (Oxy IR/ROXICODONE) immediate release tablet 5 mg  5 mg Oral Q6H PRN Jacqlyn Larsen, PA-C   5 mg at 11/04/21 2140   pantoprazole (PROTONIX) EC tablet 40 mg  40 mg Oral Daily Jacqlyn Larsen, [place]   40 mg at 11/05/21 1540   pramipexole (MIRAPEX) tablet 0.5 mg  0.5 mg Oral QHS Benedetto Goad N, PA-C   0.5 mg at 11/04/21 2141   senna-docusate (Senokot-S) tablet 2 tablet  2 tablet Oral QHS PRN Jacqlyn Larsen, PA-C       simvastatin (ZOCOR) tablet 10 mg  10 mg Oral QHS Ford, Kelsey N, PA-C   10 mg at 11/04/21 2218   sodium chloride 0.9 % bolus 1,000 mL  1,000 mL Intravenous Once Daleen Bo, MD       sodium chloride 0.9 % bolus 500 mL  500 mL Intravenous Once Daleen Bo, MD       umeclidinium bromide (INCRUSE ELLIPTA) 62.5 MCG/ACT 1 puff  1 puff Inhalation Daily Eudelia Bunch, RPH   1 puff at 11/05/21 0867   Current Outpatient Medications  Medication Sig Dispense Refill   acetaminophen (TYLENOL) 500 MG tablet Take 1,000 mg by mouth every  6 (six) hours as needed (for pain).     Albuterol Sulfate (PROAIR RESPICLICK) 619 (90 Base) MCG/ACT AEPB Inhale 2 puffs into the lungs See admin instructions. Inhale 2 puffs into the lungs every morning as needed for shortness of breath or wheezing     Cholecalciferol (VITAMIN D3) 25 MCG (1000 UT) CAPS Take 1,000 Units by mouth daily.     divalproex (DEPAKOTE SPRINKLE) 125 MG capsule Take 125 mg by mouth 2 (two) times daily.     escitalopram (LEXAPRO) 10 MG tablet Take 10 mg by mouth daily.     hydrocortisone 2.5 % lotion Apply 1 application  topically 2 (two) times daily as needed (to scaly areas of face and ears).     hydrocortisone cream 1 %  Apply 1 application  topically 2 (two) times daily as needed for itching (to scaly areas of face and ears).     ketoconazole (NIZORAL) 2 % cream Apply 1 application  topically daily as needed (to affected areas for psoriasis).     ketoconazole (NIZORAL) 2 % shampoo Apply 1 application  topically See admin instructions. Shampoo on Mondays and Thursdays     lamoTRIgine (LAMICTAL) 150 MG tablet Take 150 mg by mouth 2 (two) times daily.     levETIRAcetam (KEPPRA) 250 MG tablet Take 250 mg by mouth daily. In the morning     levETIRAcetam (KEPPRA) 500 MG tablet Take 500 mg by mouth at bedtime.     memantine (NAMENDA) 10 MG tablet TAKE 1 TABLET BY MOUTH TWICE DAILY. (Patient taking differently: Take 10 mg by mouth 2 (two) times daily.) 180 tablet 1   nystatin (MYCOSTATIN/NYSTOP) powder Apply 1 application  topically 2 (two) times daily as needed (urogenital area).     OxyCODONE HCl, Abuse Deter, (OXAYDO) 5 MG TABA Take 5 mg by mouth every 6 (six) hours as needed.  Nondisplaced transverse fracture of left patella, subsequent encounter for closed fracture with routine healing (Patient taking differently: Take 5 mg by mouth every 6 (six) hours as needed (for severe pain).) 60 tablet 0   pramipexole (MIRAPEX) 0.5 MG tablet Take 0.5 mg by mouth at bedtime.      PRESCRIPTION MEDICATION Apply 0.5 mg topically See admin instructions. Lorazepam 0.5 mg/ml gel: Apply 0.5 mg topically two times a day as needed for anxiousness     risperiDONE (RISPERDAL) 0.5 MG tablet Take 0.25 mg by mouth 2 (two) times daily.     sennosides-docusate sodium (SENOKOT-S) 8.6-50 MG tablet Take 2 tablets by mouth at bedtime as needed for constipation.     simvastatin (ZOCOR) 10 MG tablet Take 10 mg by mouth at bedtime.     tiotropium (SPIRIVA HANDIHALER) 18 MCG inhalation capsule INHALE CONTENTS OF ONE CAPSULE ONCE DAILY FOR COPD. (Patient taking differently: Place 18 mcg into inhaler and inhale See admin instructions. INHALE CONTENTS OF ONE CAPSULE (18 mcg) into the lungs ONCE DAILY FOR COPD) 30 capsule 5   triamcinolone (KENALOG) 0.1 % Apply 1 application. topically daily as needed (psoriasis).     cholecalciferol (VITAMIN D) 1000 units tablet Take 1 tablet (1,000 Units total) by mouth daily. (Patient not taking: Reported on 11/04/2021)     LORazepam (LORAZEPAM INTENSOL) 2 MG/ML concentrated solution 0.5 mg/ml gel apply to skin twice a day as needed (Patient not taking: Reported on 11/04/2021) 60 mL 0   pantoprazole (PROTONIX) 40 MG tablet Take 40 mg by mouth daily.       Discharge Medications: Please see discharge summary for a list of discharge medications.  Relevant Imaging Results:  Relevant Lab Results:   Additional Information [id number]  Arlie Solomons Sidney Kann, LCSW

## 2021-11-05 NOTE — NC FL2 (Signed)
[place] LEVEL OF CARE SCREENING TOOL     IDENTIFICATION  Patient Name: [name] Birthdate: 1932-05-05 Sex: female Admission Date (Current Location): 11/04/2021  [facility] and [place] Number:  Herbalist and Address:  [facility],  [place], [place]      Provider Number: [id number]  Attending Physician Name and Address:  Daleen Bo, MD  Relative Name and Phone Number:       Current Level of Care: Hospital Recommended Level of Care: [place] Prior Approval Number:    Date Approved/Denied:   PASRR Number: [id number]  Discharge Plan: Other (Comment) (Assisted living)    Current Diagnoses: Patient Active Problem List   Diagnosis Date Noted   Major depressive disorder, recurrent ([place]) 11/04/2021   Cognitive and behavioral changes 11/04/2021   Closed left hip fracture ([place]) 08/29/2021   Heart block AV first degree 08/29/2021   Skin irritation 07/12/2021   History of retinal detachment 02/20/2021   Fuchs' corneal dystrophy of both eyes 02/20/2021   Choroidal nevus of right eye 02/20/2021   Pseudophakia, both eyes 02/20/2021   Dry skin dermatitis 01/29/2021   COVID-19 virus infection 09/25/2020   GERD (gastroesophageal reflux disease) 04/28/2020   Nondisplaced transverse fracture of left patella, subsequent encounter for closed fracture with nonunion 04/28/2020   Generalized osteoarthritis of multiple sites 10/05/2019   Olecranon bursitis of right elbow 10/04/2019   PAD (peripheral artery disease) ([place]) 06/23/2019   Slow transit constipation 05/21/2019   Generalized weakness 05/19/2019   Vasovagal near syncope 05/19/2019   History of urinary retention 05/19/2019   Mandibular fracture, closed ([place]) 05/19/2019   Humerus fracture 05/04/2019   Right thyroid nodule 02/28/2016   Abnormal thyroid uptake 11/10/2015   Coronary artery calcification 12/05/2014   Panlobular emphysema ([place]) 07/25/2014    Dyspnea and respiratory abnormality 07/25/2014   Multiple lung nodules on CT 07/25/2014   Smoking history 07/25/2014   Pain of right lower leg 05/23/2014   Fall at home, initial encounter 05/23/2014   Gait disorder 04/25/2014   Senile dementia ([place]) 04/25/2014   Essential tremor 04/25/2014   Advanced directives, counseling/discussion 02/18/2013   Seizure disorder ([place]) 10/15/2012   Cancer of skin, squamous cell 10/15/2012   Unspecified vitamin D deficiency 10/15/2012   Hyperlipidemia 10/15/2012   Edema 09/10/2012   Hordeolum externum (stye) 09/10/2012   Restless legs syndrome (RLS) 09/12/2011   Insomnia, unspecified 09/12/2011   COPD (chronic obstructive pulmonary disease) ([place]) 05/23/2011   Essential hypertension 01/31/2011   Senile osteoporosis 01/31/2011   Depression, psychotic ([place]) 01/31/2011    Orientation RESPIRATION BLADDER Height & Weight     Self, Place  Normal External catheter Weight:   Height:  '5\' 6"'$  (167.6 cm)  BEHAVIORAL SYMPTOMS/MOOD NEUROLOGICAL BOWEL NUTRITION STATUS      Continent Diet (regular)  AMBULATORY STATUS COMMUNICATION OF NEEDS Skin   Limited Assist Verbally Normal                       Personal Care Assistance Level of Assistance  Bathing, Feeding, Dressing Bathing Assistance: Limited assistance Feeding assistance: Independent Dressing Assistance: Limited assistance     Functional Limitations Info  Sight, Hearing, Speech Sight Info: Adequate Hearing Info: Adequate Speech Info: Adequate    SPECIAL CARE FACTORS FREQUENCY  PT (By licensed PT), OT (By licensed OT)     PT Frequency: 5x a week OT Frequency: 5x a week  Contractures Contractures Info: Not present    Additional Factors Info  Code Status, Allergies Code Status Info: full Allergies Info: Vioxx (rofecoxib,Dyazide (hydrochlorothiazide W-triamterene),Latex,Sulfa Antibiotics,Sumycin (tetracycline)           Current Medications (11/05/2021):  This is  the current hospital active medication list Current Facility-Administered Medications  Medication Dose Route Frequency Provider Last Rate Last Admin   acetaminophen (TYLENOL) tablet 1,000 mg  1,000 mg Oral Q6H PRN Jacqlyn Larsen, PA-C       albuterol (PROVENTIL) (2.5 MG/3ML) 0.083% nebulizer solution 2 mL  2 mL Inhalation Q6H PRN Benedetto Goad N, PA-C       divalproex (DEPAKOTE SPRINKLE) capsule 125 mg  125 mg Oral BID Jacqlyn Larsen, PA-C   125 mg at 11/05/21 0258   escitalopram (LEXAPRO) tablet 10 mg  10 mg Oral Daily Jacqlyn Larsen, [place]   10 mg at 11/05/21 5277   lamoTRIgine (LAMICTAL) tablet 150 mg  150 mg Oral BID Jacqlyn Larsen, PA-C   150 mg at 11/05/21 8242   levETIRAcetam (KEPPRA) tablet 250 mg  250 mg Oral Daily Jacqlyn Larsen, [place]   250 mg at 11/05/21 3536   levETIRAcetam (KEPPRA) tablet 500 mg  500 mg Oral QHS Benedetto Goad N, PA-C   500 mg at 11/04/21 2140   melatonin tablet 3 mg  3 mg Oral QHS Roemhildt, Lorin T, PA-C       memantine (NAMENDA) tablet 10 mg  10 mg Oral BID Benedetto Goad N, PA-C   10 mg at 11/05/21 1443   oxyCODONE (Oxy IR/ROXICODONE) immediate release tablet 5 mg  5 mg Oral Q6H PRN Jacqlyn Larsen, PA-C   5 mg at 11/04/21 2140   pantoprazole (PROTONIX) EC tablet 40 mg  40 mg Oral Daily Jacqlyn Larsen, [place]   40 mg at 11/05/21 1540   pramipexole (MIRAPEX) tablet 0.5 mg  0.5 mg Oral QHS Benedetto Goad N, PA-C   0.5 mg at 11/04/21 2141   senna-docusate (Senokot-S) tablet 2 tablet  2 tablet Oral QHS PRN Jacqlyn Larsen, PA-C       simvastatin (ZOCOR) tablet 10 mg  10 mg Oral QHS Ford, Kelsey N, PA-C   10 mg at 11/04/21 2218   umeclidinium bromide (INCRUSE ELLIPTA) 62.5 MCG/ACT 1 puff  1 puff Inhalation Daily Eudelia Bunch, RPH   1 puff at 11/05/21 0867   Current Outpatient Medications  Medication Sig Dispense Refill   acetaminophen (TYLENOL) 500 MG tablet Take 1,000 mg by mouth every 6 (six) hours as needed (for pain).     Albuterol Sulfate (PROAIR RESPICLICK) 619 (90  Base) MCG/ACT AEPB Inhale 2 puffs into the lungs See admin instructions. Inhale 2 puffs into the lungs every morning as needed for shortness of breath or wheezing     Cholecalciferol (VITAMIN D3) 25 MCG (1000 UT) CAPS Take 1,000 Units by mouth daily.     divalproex (DEPAKOTE SPRINKLE) 125 MG capsule Take 125 mg by mouth 2 (two) times daily.     escitalopram (LEXAPRO) 10 MG tablet Take 10 mg by mouth daily.     hydrocortisone 2.5 % lotion Apply 1 application  topically 2 (two) times daily as needed (to scaly areas of face and ears).     hydrocortisone cream 1 % Apply 1 application  topically 2 (two) times daily as needed for itching (to scaly areas of face and ears).     ketoconazole (NIZORAL) 2 % cream Apply 1 application  topically  daily as needed (to affected areas for psoriasis).     ketoconazole (NIZORAL) 2 % shampoo Apply 1 application  topically See admin instructions. Shampoo on Mondays and Thursdays     lamoTRIgine (LAMICTAL) 150 MG tablet Take 150 mg by mouth 2 (two) times daily.     levETIRAcetam (KEPPRA) 250 MG tablet Take 250 mg by mouth daily. In the morning     levETIRAcetam (KEPPRA) 500 MG tablet Take 500 mg by mouth at bedtime.     memantine (NAMENDA) 10 MG tablet TAKE 1 TABLET BY MOUTH TWICE DAILY. (Patient taking differently: Take 10 mg by mouth 2 (two) times daily.) 180 tablet 1   nystatin (MYCOSTATIN/NYSTOP) powder Apply 1 application  topically 2 (two) times daily as needed (urogenital area).     OxyCODONE HCl, Abuse Deter, (OXAYDO) 5 MG TABA Take 5 mg by mouth every 6 (six) hours as needed.  Nondisplaced transverse fracture of left patella, subsequent encounter for closed fracture with routine healing (Patient taking differently: Take 5 mg by mouth every 6 (six) hours as needed (for severe pain).) 60 tablet 0   pramipexole (MIRAPEX) 0.5 MG tablet Take 0.5 mg by mouth at bedtime.     PRESCRIPTION MEDICATION Apply 0.5 mg topically See admin instructions. Lorazepam 0.5 mg/ml gel:  Apply 0.5 mg topically two times a day as needed for anxiousness     risperiDONE (RISPERDAL) 0.5 MG tablet Take 0.25 mg by mouth 2 (two) times daily.     sennosides-docusate sodium (SENOKOT-S) 8.6-50 MG tablet Take 2 tablets by mouth at bedtime as needed for constipation.     simvastatin (ZOCOR) 10 MG tablet Take 10 mg by mouth at bedtime.     tiotropium (SPIRIVA HANDIHALER) 18 MCG inhalation capsule INHALE CONTENTS OF ONE CAPSULE ONCE DAILY FOR COPD. (Patient taking differently: Place 18 mcg into inhaler and inhale See admin instructions. INHALE CONTENTS OF ONE CAPSULE (18 mcg) into the lungs ONCE DAILY FOR COPD) 30 capsule 5   triamcinolone (KENALOG) 0.1 % Apply 1 application. topically daily as needed (psoriasis).     cholecalciferol (VITAMIN D) 1000 units tablet Take 1 tablet (1,000 Units total) by mouth daily. (Patient not taking: Reported on 11/04/2021)     LORazepam (LORAZEPAM INTENSOL) 2 MG/ML concentrated solution 0.5 mg/ml gel apply to skin twice a day as needed (Patient not taking: Reported on 11/04/2021) 60 mL 0   pantoprazole (PROTONIX) 40 MG tablet Take 40 mg by mouth daily.       Discharge Medications: Please see discharge summary for a list of discharge medications.  Relevant Imaging Results:  Relevant Lab Results:   Additional Information [id number]  Arlie Solomons Josue Falconi, LCSW

## 2021-11-05 NOTE — Consult Note (Cosign Needed Addendum)
Patient is seen in the room awake, alert and oriented x 5-name, age, place of resident, year and month.  She denied feeling suicidal and denied AVH.  Patient reported willingness to go back to [facility] where she has ben for 13 years she says.  Patient denied pain or discomfort but stated she came in to the hospital because she was having blackout.  She also stated that she does not have those here since coming to the ER.  Patient does not meet criteria for inpatient Geropsych hospitalization and is Psychiatrically cleared. To go back to the facility.

## 2021-11-05 NOTE — Discharge Instructions (Signed)
Follow-up with your doctor for further care and treatment as needed, and as planned.

## 2021-11-05 NOTE — ED Notes (Addendum)
[facility] to [facility].

## 2021-11-06 ENCOUNTER — Encounter: Payer: Self-pay | Admitting: Internal Medicine

## 2021-11-06 ENCOUNTER — Non-Acute Institutional Stay (SKILLED_NURSING_FACILITY): Payer: Medicare PPO | Admitting: Internal Medicine

## 2021-11-06 DIAGNOSIS — K219 Gastro-esophageal reflux disease without esophagitis: Secondary | ICD-10-CM | POA: Diagnosis not present

## 2021-11-06 DIAGNOSIS — Z8781 Personal history of (healed) traumatic fracture: Secondary | ICD-10-CM | POA: Diagnosis not present

## 2021-11-06 DIAGNOSIS — G40909 Epilepsy, unspecified, not intractable, without status epilepticus: Secondary | ICD-10-CM | POA: Diagnosis not present

## 2021-11-06 DIAGNOSIS — F323 Major depressive disorder, single episode, severe with psychotic features: Secondary | ICD-10-CM | POA: Diagnosis not present

## 2021-11-06 NOTE — Progress Notes (Signed)
Location: [facility] Room Number: [id number] Place of Service:  SNF [phone])  Provider: Veleta Miners MD  Code Status: DNR Goals of Care:     11/04/2021   10:35 AM  Advanced Directives  Does Patient Have a Medical Advance Directive? No  Does patient want to make changes to medical advance directive? No - Patient declined  Would patient like information on creating a medical advance directive? No - Patient declined  Pre-existing out of facility DNR order (yellow form or pink MOST form) Pink Most/Yellow Form available - Physician notified to receive inpatient order     Chief Complaint  Patient presents with   Acute Visit    HPI: Patient is a 88 y.o. female seen today for an acute visit for ED follow up and Behaviors  Patient lives in SNF in [place]   Patient has h/o COPD, Hypertension,Seizure Disorder,Mild Cognitive impairment, Hyperlipidemia Chronic Venous Changes in LE  H/o Thyroid Nodules   Admitted in the hospital from 5/24-6/03 for Left Hip Fracture Underwent Intramedullary fixation after a fall per Dr. Lyla Glassing note her healing is complicated by worsening of her varus deformity at the base of the femoral neck. He wants to continue toe-touch weightbearing and if no improvement in 6 weeks to consider arthroplasty.   Since then she has had acute episode of hallucinations and paranoia.   The CT scan of the head was negative.   Was send to ED as she had acute Mental Breakdown Including refusing Meds. Being Paranoia that Nurses want to harm her ED found to have urinary retention. Urology was called and per their notes she has a narrow introitus  So he had to put suprapubic tube catheter. She was also seen by psych services Her MMSE was 25 out of 27 Psych did not think that she needed admission and was discharged with no changes made in her medication  Patient continues to be paranoid.  Was slightly upset with me because she thinks that I  sent her to  emergency room.  Continues to be very depressed.  She told the social worker that the staff ransacked her room last Night   And are happy that now she has a suprapubic catheter. Wt Readings from Last 3 Encounters:  11/06/21 148 lb 14.4 oz (67.5 kg)  11/02/21 148 lb (67.1 kg)  10/30/21 151 lb 3.2 oz (68.6 kg)    Past Medical History:  Diagnosis Date   Acute bronchitis 05/23/2011   Acute upper respiratory infections of unspecified site 05/23/2011   Arthritis    Chronic airway obstruction, not elsewhere classified 05/23/2011   Disturbance of salivary secretion 01/31/2011   Dizziness and giddiness 01/31/2011   Dyspnea    Essential tremor 04/25/2014   External hemorrhoids without mention of complication 02/40/9735   Gait disorder 04/25/2014   Insomnia, unspecified 09/12/2011   Lumbago 01/31/2011   Major depressive disorder, single episode, unspecified 01/31/2011   Memory disorder 04/25/2014   Mitral valve disorders(424.0) 01/31/2011   Other and unspecified hyperlipidemia 01/31/2011   Other convulsions 01/31/2011   Other emphysema ([place]) 01/31/2011   Pain in joint, site unspecified 01/31/2011   Restless legs syndrome (RLS) 09/12/2011   Retinal detachment with retinal defect of right eye 2011   right eye twice   Seizure disorder ([place])    Senile osteoporosis 01/31/2011   Spontaneous ecchymoses 01/31/2011   Stiffness of joints, not elsewhere classified, multiple sites 01/31/2011   Unspecified essential hypertension 01/31/2011    Past Surgical  History:  Procedure Laterality Date   ABDOMINAL HYSTERECTOMY  06/21/2003   TAH/BSO, omenectomy PSB resect, Stg IC cystadenofibroma   CHOLECYSTECTOMY  2005   Dr. Marlou Starks   ELBOW SURGERY Right 2008   broken   Dr. Apolonio Schneiders   EYE SURGERY     INTRAMEDULLARY (IM) NAIL INTERTROCHANTERIC Left 08/30/2021   Procedure: INTRAMEDULLARY (IM) NAIL INTERTROCHANTRIC;  Surgeon: Rod Can, MD;  Location: [facility];  Service: Orthopedics;  Laterality: Left;   ORIF PATELLA  Left 05/02/2020   Procedure: OPEN REDUCTION INTERNAL (ORIF) FIXATION LEFT PATELLA WITH MEDIAL AND LATERAL LIGAMENT REINFORCEMENTS;  Surgeon: [name], MD;  Location: [facility];  Service: Orthopedics;  Laterality: Left;   ORIF PATELLA Left 05/30/2020   Procedure: OPEN REDUCTION INTERNAL (ORIF) FIXATION PATELLA;  Surgeon: [name], MD;  Location: [facility];  Service: Orthopedics;  Laterality: Left;   RETINAL DETACHMENT SURGERY N/A    two   REVERSE SHOULDER ARTHROPLASTY Left 05/06/2019   Procedure: REVERSE SHOULDER ARTHROPLASTY;  Surgeon: Justice Britain, MD;  Location: [facility];  Service: Orthopedics;  Laterality: Left;  133mn   ROTATOR CUFF REPAIR Right 2012   Dr. CTheda Sers  SQUAMOUS CELL CARCINOMA EXCISION Bilateral 2012, 8/14   [place] on legs   Dr. GSarajane Jews  TONSILLECTOMY  1941   VIDEO BRONCHOSCOPY WITH ENDOBRONCHIAL NAVIGATION N/A 11/29/2015   Procedure: VIDEO BRONCHOSCOPY WITH ENDOBRONCHIAL NAVIGATION;  Surgeon: RCollene Gobble MD;  [facility]  Service: Thoracic;  Laterality: N/A;    Allergies  Allergen Reactions   Vioxx [Rofecoxib] Shortness Of Breath   Dyazide [Hydrochlorothiazide W-Triamterene] Other (See Comments)    Lowers blood pressure too much   Latex Swelling and Other (See Comments)    "Allergic," per [facility]   Sulfa Antibiotics Nausea And Vomiting and Other (See Comments)    "Allergic," [facility]  Sumycin [Tetracycline] Other (See Comments)    Can't take due to drug reaction- [facility]   Outpatient Encounter Medications as of 11/06/2021  Medication Sig   acetaminophen (TYLENOL) 500 MG tablet Take 1,000 mg by mouth every 6 (six) hours as needed (for pain).   Albuterol Sulfate (PROAIR RESPICLICK) 1025(90 Base) MCG/ACT AEPB Inhale 2 puffs into the lungs See admin instructions. Inhale 2 puffs into the lungs every morning as needed for shortness of breath or wheezing   Cholecalciferol (VITAMIN D3) 25 MCG (1000 UT) CAPS Take 1,000 Units by mouth daily.    divalproex (DEPAKOTE SPRINKLE) 125 MG capsule Take 125 mg by mouth 2 (two) times daily.   escitalopram (LEXAPRO) 10 MG tablet Take 10 mg by mouth daily.   hydrocortisone 2.5 % lotion Apply 1 application  topically 2 (two) times daily as needed (to scaly areas of face and ears).   hydrocortisone cream 1 % Apply 1 application  topically 2 (two) times daily as needed for itching (to scaly areas of face and ears).   ketoconazole (NIZORAL) 2 % cream Apply 1 application  topically daily as needed (to affected areas for psoriasis).   ketoconazole (NIZORAL) 2 % shampoo Apply 1 application  topically See admin instructions. Shampoo on Mondays and Thursdays   lamoTRIgine (LAMICTAL) 150 MG tablet Take 150 mg by mouth 2 (two) times daily.   levETIRAcetam (KEPPRA) 250 MG tablet Take 250 mg by mouth daily. In the morning   levETIRAcetam (KEPPRA) 500 MG tablet Take 500 mg by mouth at bedtime.   LORazepam (LORAZEPAM INTENSOL) 2 MG/ML concentrated solution 0.5 mg/ml gel apply to  skin twice a day as needed   memantine (NAMENDA) 10 MG tablet TAKE 1 TABLET BY MOUTH TWICE DAILY.   nystatin (MYCOSTATIN/NYSTOP) powder Apply 1 application  topically 2 (two) times daily as needed (urogenital area).   OxyCODONE HCl, Abuse Deter, (OXAYDO) 5 MG TABA Take 5 mg by mouth every 6 (six) hours as needed.  Nondisplaced transverse fracture of left patella, subsequent encounter for closed fracture with routine healing   pantoprazole (PROTONIX) 40 MG tablet Take 40 mg by mouth daily.   pramipexole (MIRAPEX) 0.5 MG tablet Take 0.5 mg by mouth at bedtime.   risperiDONE (RISPERDAL) 0.5 MG tablet Take 0.25 mg by mouth 2 (two) times daily.   sennosides-docusate sodium (SENOKOT-S) 8.6-50 MG tablet Take 2 tablets by mouth at bedtime as needed for constipation.   simvastatin (ZOCOR) 10 MG tablet Take 10 mg by mouth at bedtime.   tiotropium (SPIRIVA HANDIHALER) 18 MCG inhalation capsule INHALE CONTENTS OF ONE CAPSULE ONCE DAILY FOR COPD.    triamcinolone (KENALOG) 0.1 % Apply 1 application. topically daily as needed (psoriasis).   [DISCONTINUED] cholecalciferol (VITAMIN D) 1000 units tablet Take 1 tablet (1,000 Units total) by mouth daily.   [DISCONTINUED] PRESCRIPTION MEDICATION Apply 0.5 mg topically See admin instructions. Lorazepam 0.5 mg/ml gel: Apply 0.5 mg topically two times a day as needed for anxiousness   No facility-administered encounter medications on file as of 11/06/2021.    Review of Systems:  Review of Systems  Constitutional:  Positive for activity change. Negative for appetite change.  HENT: Negative.    Respiratory:  Negative for cough and shortness of breath.   Cardiovascular:  Negative for leg swelling.  Gastrointestinal:  Negative for constipation.  Genitourinary: Negative.   Musculoskeletal:  Positive for gait problem. Negative for arthralgias and myalgias.  Skin: Negative.   Neurological:  Negative for dizziness and weakness.  Psychiatric/Behavioral:  Positive for dysphoric mood. Negative for confusion and sleep disturbance. The patient is nervous/anxious.     Health Maintenance  Topic Date Due   INFLUENZA VACCINE  11/06/2021   TETANUS/TDAP  08/16/2025   Pneumonia Vaccine 87+ Years old  Completed   DEXA SCAN  Completed   Zoster Vaccines- Shingrix  Completed   HPV VACCINES  Aged Out   MAMMOGRAM  Discontinued   COVID-19 Vaccine  Discontinued    Physical Exam: Vitals:   11/06/21 1144  BP: 109/64  Pulse: 80  Resp: 16  Temp: (!) 96.9 F (36.1 C)  SpO2: 93%  Weight: 148 lb 14.4 oz (67.5 kg)  Height: '5\' 2"'$  (1.575 m)   Body mass index is 27.23 kg/m. Physical Exam Vitals reviewed.  Constitutional:      Appearance: Normal appearance.  HENT:     Head: Normocephalic.     Nose: Nose normal.     Mouth/Throat:     Mouth: Mucous membranes are moist.     Pharynx: Oropharynx is clear.  Eyes:     Pupils: Pupils are equal, round, and reactive to light.  Cardiovascular:     Rate and Rhythm:  Normal rate and regular rhythm.     Pulses: Normal pulses.     Heart sounds: Normal heart sounds. No murmur heard. Pulmonary:     Effort: Pulmonary effort is normal.     Breath sounds: Normal breath sounds.  Abdominal:     General: Abdomen is flat. Bowel sounds are normal.     Palpations: Abdomen is soft.  Musculoskeletal:     Cervical back: Neck supple.  Comments: Chronic venous changes  Skin:    General: Skin is warm.  Neurological:     General: No focal deficit present.     Mental Status: She is alert and oriented to person, place, and time.  Psychiatric:        Mood and Affect: Mood normal.        Thought Content: Thought content normal.     Labs reviewed: Basic Metabolic Panel: Recent Labs    05/22/21 0000 08/29/21 0930 09/02/21 0359 09/04/21 1308 09/06/21 0449 10/12/21 0000 10/23/21 0000 11/04/21 1047  NA 141   < > 138 144   < > 145 144 145  K 4.0   < > 3.2* 3.9   < > 3.7 3.2* 3.2*  CL 106   < > 104 108   < > 107 107 109  CO2 29*   < > 27 26   < > 29* 30* 27  GLUCOSE  --    < > 98 102*  --   --   --  80  BUN 23*   < > 21 31*   < > '17 20 16  '$ CREATININE 0.9   < > 0.78 0.88   < > 0.9 0.9 0.69  CALCIUM 9.2   < > 8.5* 8.8*   < > 9.4 8.9 9.6  MG  --   --  2.2  --   --   --   --   --   TSH 1.89  --   --   --   --   --   --   --    < > = values in this interval not displayed.   Liver Function Tests: Recent Labs    05/22/21 0000 10/12/21 0000 11/04/21 1047  AST '16 18 20  '$ ALT '12 9 12  '$ ALKPHOS  --  224* 157*  BILITOT  --   --  0.5  PROT  --   --  7.0  ALBUMIN 4.1 4.0 3.6   No results for input(s): "LIPASE", "AMYLASE" in the last 8760 hours. No results for input(s): "AMMONIA" in the last 8760 hours. CBC: Recent Labs    09/02/21 0359 09/04/21 1308 09/11/21 0000 10/12/21 0000 11/04/21 1047  WBC 7.8 7.7 6.3 4.7 4.7  NEUTROABS  --   --  4,416.00 2,712.00 3.2  HGB 9.4* 10.0* 10.0* 12.7 12.9  HCT 30.6* 32.5* 32* 40 41.6  MCV 85.2 86.4  --   --  83.7   PLT 190 239 422* 363 252   Lipid Panel: Recent Labs    11/23/20 0000 09/27/21 0000  CHOL 169 147  HDL 64 48  LDLCALC 88 78  TRIG 80 131   No results found for: "HGBA1C"  Procedures since last visit: DG Chest 2 View  Result Date: 11/04/2021 CLINICAL DATA:  Medical clearance.  Visual hallucinations. EXAM: CHEST - 2 VIEW COMPARISON:  Aug 29, 2021 FINDINGS: Platelike opacities in the bases consistent with atelectasis. Small left pleural effusion versus pleural thickening. The cardiomediastinal silhouette is stable. No pneumothorax. No other acute abnormalities. IMPRESSION: Small left pleural effusion versus pleural thickening. Bibasilar atelectasis. Electronically Signed   By: Dorise Bullion III M.D.   On: 11/04/2021 12:00   CT HEAD WO CONTRAST (5MM)  Result Date: 10/23/2021 CLINICAL DATA:  New onset confusion. History of seizures. Dizziness. EXAM: CT HEAD WITHOUT CONTRAST TECHNIQUE: Contiguous axial images were obtained from the base of the skull through the vertex without intravenous contrast. RADIATION DOSE REDUCTION:  This exam was performed according to the departmental dose-optimization program which includes automated exposure control, adjustment of the mA and/or kV according to patient size and/or use of iterative reconstruction technique. COMPARISON:  05/03/2019 FINDINGS: Brain: No evidence of acute infarction, hemorrhage, hydrocephalus, extra-axial collection or mass lesion/mass effect. Cerebral volume loss and chronic small vessel ischemia which is mild for age and essentially stable from 2021. Vascular: No hyperdense vessel or unexpected calcification. Skull: Remote right mandibular fracturing. Sinuses/Orbits: Bilateral cataract resection. No evidence of injury. IMPRESSION: Aging brain without recent insult or change from 2021. Electronically Signed   By: Jorje Guild M.D.   On: 10/23/2021 07:33    Assessment/Plan 1. Depression, psychotic ([place]) Seen by Pscyh in ED Continues to  be Paranoid and Depressed Change Lexapro to 20 mg QD Also Change Depakote to 125 mg TID  2. S/p left hip fracture with Complicated Healing No Pain Does have appointment with Ortho in 2 weeks Keep working with therapy [facility] right now 3. Seizure disorder ([place]) On Lamictal and Keppra  4. Gastroesophageal reflux disease, unspecified whether esophagitis present Continue Protonix 5 Suprapubic Catheter Follow up with Urology in 6 weeks  6 Hypokalemia K 20 meq QD BMP in 1 week Hallucinations New  ? Due to Depression CT scan negative Neurology Consult Written   Other issues Restless legs syndrome (RLS) Mirapex QHS    Panlobular emphysema ([facility])  Spiriva Proair PRn   Essential hypertension Off Norvasc for now   Gastroesophageal reflux disease, unspecified whether esophagitis present Continue Protonix   Senile dementia ([place]) Highly Functional On Namenda    Mixed hyperlipidemia On simvastatin  Bilateral lower extremity edema Off Lasix . Will uses PRn   Labs/tests ordered:  * No order type specified * Next appt:  Visit date not found Total time spent in this patient care encounter was  45_  minutes; greater than 50% of the visit spent counseling patient and staff, reviewing records , Labs and coordinating care for problems addressed at this encounter.

## 2021-11-07 ENCOUNTER — Telehealth: Payer: Self-pay | Admitting: *Deleted

## 2021-11-07 NOTE — Telephone Encounter (Signed)
        Patient  visited [facility] long ed on 11/05/2021  for Uti  hallucinations    Telephone encounter attempt :  1st left a message   A HIPAA compliant voice message was left requesting a return call.  Instructed patient to call back at   Instructed patient to call back at [phone]  at their earliest convenience. . [place], Population [facility] [phone] [address] , [place] [address] Email : [email or web address]

## 2021-11-09 ENCOUNTER — Non-Acute Institutional Stay (SKILLED_NURSING_FACILITY): Payer: Medicare PPO | Admitting: Nurse Practitioner

## 2021-11-09 ENCOUNTER — Telehealth: Payer: Self-pay | Admitting: *Deleted

## 2021-11-09 ENCOUNTER — Encounter: Payer: Self-pay | Admitting: Nurse Practitioner

## 2021-11-09 DIAGNOSIS — I10 Essential (primary) hypertension: Secondary | ICD-10-CM

## 2021-11-09 DIAGNOSIS — R627 Adult failure to thrive: Secondary | ICD-10-CM | POA: Diagnosis not present

## 2021-11-09 DIAGNOSIS — G40909 Epilepsy, unspecified, not intractable, without status epilepticus: Secondary | ICD-10-CM | POA: Diagnosis not present

## 2021-11-09 DIAGNOSIS — K219 Gastro-esophageal reflux disease without esophagitis: Secondary | ICD-10-CM | POA: Diagnosis not present

## 2021-11-09 DIAGNOSIS — F0390 Unspecified dementia without behavioral disturbance: Secondary | ICD-10-CM

## 2021-11-09 DIAGNOSIS — J431 Panlobular emphysema: Secondary | ICD-10-CM

## 2021-11-09 DIAGNOSIS — M159 Polyosteoarthritis, unspecified: Secondary | ICD-10-CM | POA: Diagnosis not present

## 2021-11-09 DIAGNOSIS — K5901 Slow transit constipation: Secondary | ICD-10-CM

## 2021-11-09 DIAGNOSIS — F323 Major depressive disorder, single episode, severe with psychotic features: Secondary | ICD-10-CM | POA: Diagnosis not present

## 2021-11-09 DIAGNOSIS — Z9359 Other cystostomy status: Secondary | ICD-10-CM | POA: Diagnosis not present

## 2021-11-09 DIAGNOSIS — R609 Edema, unspecified: Secondary | ICD-10-CM

## 2021-11-09 DIAGNOSIS — E876 Hypokalemia: Secondary | ICD-10-CM

## 2021-11-09 DIAGNOSIS — G2581 Restless legs syndrome: Secondary | ICD-10-CM | POA: Diagnosis not present

## 2021-11-09 NOTE — Assessment & Plan Note (Signed)
F/u Urology

## 2021-11-09 NOTE — Assessment & Plan Note (Signed)
Stable,  takes Senokot 

## 2021-11-09 NOTE — Assessment & Plan Note (Signed)
on Memantine

## 2021-11-09 NOTE — Assessment & Plan Note (Signed)
OA in general, on Tylenol, Oxycodone. S/p ORIF left hip 08/29/21, TDWB due to complicated healing. A closed nondisplaced transverse fracture left patella 04/26/20, f/u Ortho s/p  ORIF.

## 2021-11-09 NOTE — Assessment & Plan Note (Signed)
Persisted poor oral intake, gradual weight loss since the left hip fx surgery, on Risperdal, Lexapro, Depakote for mood and behaviors. The patient and her HPOA desires Hospice service today.

## 2021-11-09 NOTE — Assessment & Plan Note (Signed)
takes MiraPex

## 2021-11-09 NOTE — Assessment & Plan Note (Signed)
trace, prn Furosemide,  Bun/creat 16/0.69 11/04/21

## 2021-11-09 NOTE — Assessment & Plan Note (Signed)
Depression, psychotic featured, saw Psych in ED 11/04/21, paranoid, hallucination, taking Lexapro, Depakote, Risperdal

## 2021-11-09 NOTE — Assessment & Plan Note (Signed)
ST eval significant reflux as results of mucus production, oropharyngeal dysphagia. Takes Pantoprazole, improved. Hgb 12.9 11/04/21

## 2021-11-09 NOTE — Assessment & Plan Note (Signed)
takes Spiriva qd, prn ProAir

## 2021-11-09 NOTE — Assessment & Plan Note (Signed)
Controlled blood pressure, off meds.

## 2021-11-09 NOTE — Assessment & Plan Note (Signed)
stable, on Keppra, Lamictal

## 2021-11-09 NOTE — Assessment & Plan Note (Signed)
K 3.2 11/04/21, on Kcl, f/u BMP scheduled

## 2021-11-09 NOTE — Progress Notes (Signed)
Location:  [facility] Room Number: NO/29/A Place of Service:  SNF (31) Provider:  Toshio Slusher X, NP [name], MD  Patient Care Team: [name], MD as PCP - General (Internal Medicine) [name], MD as Consulting Physician (Ophthalmology) [name], MD as Consulting Physician (Cardiology) [name], MD as Consulting Physician (Dermatology) [name], MD as Consulting Physician (Orthopedic Surgery) [name], MD as Consulting Physician (Orthopedic Surgery) [name], MD as Consulting Physician (Orthopedic Surgery) [place], [place], Laelia Angelo X, NP as Nurse Practitioner (Nurse Practitioner) [name], MD (Inactive) as Consulting Physician (Neurology)  Extended Emergency Contact Information Primary Emergency Contact: [name],[name] Address: [address]          [place], [place] [address] [name] of [place] Phone: [phone] Relation: Sister Secondary Emergency Contact: [name] [place] of [place] Phone: [phone] Mobile Phone: [phone] Relation: Sister  Code Status:  DNR Goals of care: Advanced Directive information    11/09/2021   10:15 AM  Advanced Directives  Does Patient Have a Medical Advance Directive? No  Does patient want to make changes to medical advance directive? No - Patient declined  Would patient like information on creating a medical advance directive? No - Patient declined  Pre-existing out of facility DNR order (yellow form or pink MOST form) Pink Most/Yellow Form available - Physician notified to receive inpatient order     Chief Complaint  Patient presents with   Medical Management of Chronic Issues    Patient is here for a follow up for chronic conditions, patient is due for flu vaccine and fall screening      HPI:  Pt is a 88 y.o. female seen today for managing chronic medical conditions.   Persisted poor oral  intake, gradual weight loss since the left hip fx surgery, on Risperdal, Lexapro, Depakote for mood and behaviors. The patient and her HPOA desires Hospice service today.   Depression, psychotic featured, saw Psych in ED 11/04/21, paranoid, hallucination, taking Lexapro, Depakote, Risperdal  Suprapubic Catheter, f/u Urology  OA in general, on Tylenol, Oxycodone. S/p ORIF left hip 08/29/21, TDWB due to complicated healing. A closed nondisplaced transverse fracture left patella 04/26/20, f/u Ortho s/p  ORIF.             GERD ST eval significant reflux as results of mucus production, oropharyngeal dysphagia. Takes Pantoprazole, improved. Hgb 12.9 11/04/21             Restless leg symptom, takes MiraPex              Hx of dementia, on Memantine              Hx of seizures, stable, on Keppra, Lamictal             Edema, trace, prn Furosemide,  Bun/creat 16/0.69 11/04/21  Hypokalemia, K 3.2 11/04/21, on Kcl, f/u BMP scheduled             HTN, off Amlodipine             COPD, takes Spiriva qd, prn ProAir             Constipation, takes Senokot             OP, stopped Foxamax per her request. takes Vit D.              Hyperlipidemia, takes Simvastatin, LDL 78  09/27/21   Past Medical History:  Diagnosis Date   Acute bronchitis 05/23/2011   Acute upper respiratory infections of unspecified site 05/23/2011   Arthritis    Chronic airway obstruction, not elsewhere classified 05/23/2011   Disturbance of salivary secretion 01/31/2011   Dizziness and giddiness 01/31/2011   Dyspnea    Essential tremor 04/25/2014   External hemorrhoids without mention of complication 28/31/5176   Gait disorder 04/25/2014   Insomnia, unspecified 09/12/2011   Lumbago 01/31/2011   Major depressive disorder, single episode, unspecified 01/31/2011   Memory disorder 04/25/2014   Mitral valve disorders(424.0) 01/31/2011   Other and unspecified hyperlipidemia 01/31/2011   Other convulsions 01/31/2011   Other emphysema ([place]) 01/31/2011    Pain in joint, site unspecified 01/31/2011   Restless legs syndrome (RLS) 09/12/2011   Retinal detachment with retinal defect of right eye 2011   right eye twice   Seizure disorder ([place])    Senile osteoporosis 01/31/2011   Spontaneous ecchymoses 01/31/2011   Stiffness of joints, not elsewhere classified, multiple sites 01/31/2011   Unspecified essential hypertension 01/31/2011   Past Surgical History:  Procedure Laterality Date   ABDOMINAL HYSTERECTOMY  06/21/2003   TAH/BSO, omenectomy PSB resect, Stg IC cystadenofibroma   CHOLECYSTECTOMY  2005   Dr. Marlou Starks   ELBOW SURGERY Right 2008   broken   Dr. Apolonio Schneiders   EYE SURGERY     INTRAMEDULLARY (IM) NAIL INTERTROCHANTERIC Left 08/30/2021   Procedure: INTRAMEDULLARY (IM) NAIL INTERTROCHANTRIC;  Surgeon: Rod Can, MD;  Location: [facility];  Service: Orthopedics;  Laterality: Left;   ORIF PATELLA Left 05/02/2020   Procedure: OPEN REDUCTION INTERNAL (ORIF) FIXATION LEFT PATELLA WITH MEDIAL AND LATERAL LIGAMENT REINFORCEMENTS;  Surgeon: [name], MD;  Location: [facility];  Service: Orthopedics;  Laterality: Left;   ORIF PATELLA Left 05/30/2020   Procedure: OPEN REDUCTION INTERNAL (ORIF) FIXATION PATELLA;  Surgeon: [name], MD;  Location: [facility];  Service: Orthopedics;  Laterality: Left;   RETINAL DETACHMENT SURGERY N/A    two   REVERSE SHOULDER ARTHROPLASTY Left 05/06/2019   Procedure: REVERSE SHOULDER ARTHROPLASTY;  Surgeon: Justice Britain, MD;  Location: [facility];  Service: Orthopedics;  Laterality: Left;  165mn   ROTATOR CUFF REPAIR Right 2012   Dr. CTheda Sers  SQUAMOUS CELL CARCINOMA EXCISION Bilateral 2012, 8/14   [place] on legs   Dr. GSarajane Jews  TONSILLECTOMY  1941   VIDEO BRONCHOSCOPY WITH ENDOBRONCHIAL NAVIGATION N/A 11/29/2015   Procedure: VIDEO BRONCHOSCOPY WITH ENDOBRONCHIAL NAVIGATION;  Surgeon: RCollene Gobble MD;  [facility]  Service: Thoracic;  Laterality: N/A;    Allergies  Allergen Reactions   Vioxx [Rofecoxib]  Shortness Of Breath   Dyazide [Hydrochlorothiazide W-Triamterene] Other (See Comments)    Lowers blood pressure too much   Latex Swelling and Other (See Comments)    "Allergic," per [facility]   Sulfa Antibiotics Nausea And Vomiting and Other (See Comments)    [facility]  Sumycin [Tetracycline] Other (See Comments)    [facility] take due to drug reaction- [facility]   Outpatient Encounter Medications as of 11/09/2021  Medication Sig   acetaminophen (TYLENOL) 500 MG tablet Take 1,000 mg by mouth every 6 (six) hours as needed (for pain).   Albuterol Sulfate (PROAIR RESPICLICK) 1160(90 Base) MCG/ACT AEPB Inhale 2 puffs into the lungs See admin instructions. Inhale 2 puffs into the lungs every morning as needed for shortness of breath or wheezing   Cholecalciferol (VITAMIN D3) 25 MCG (1000  UT) CAPS Take 1,000 Units by mouth daily.   divalproex (DEPAKOTE SPRINKLE) 125 MG capsule Take 125 mg by mouth 3 (three) times daily.   escitalopram (LEXAPRO) 10 MG tablet Take 20 mg by mouth daily.   hydrocortisone 2.5 % lotion Apply 1 application  topically 2 (two) times daily as needed (to scaly areas of face and ears).   hydrocortisone cream 1 % Apply 1 application  topically 2 (two) times daily as needed for itching (to scaly areas of face and ears).   ketoconazole (NIZORAL) 2 % cream Apply 1 application  topically daily as needed (to affected areas for psoriasis).   ketoconazole (NIZORAL) 2 % shampoo Apply 1 application  topically See admin instructions. Shampoo on Mondays and Thursdays   lamoTRIgine (LAMICTAL) 150 MG tablet Take 150 mg by mouth 2 (two) times daily.   levETIRAcetam (KEPPRA) 250 MG tablet Take 250 mg by mouth daily. In the morning   levETIRAcetam (KEPPRA) 500 MG tablet Take 500 mg by mouth at bedtime.   LORazepam (LORAZEPAM INTENSOL) 2 MG/ML concentrated solution 0.5 mg/ml gel apply to skin twice a day as needed   memantine (NAMENDA) 10 MG tablet TAKE 1 TABLET BY MOUTH TWICE DAILY.    nystatin (MYCOSTATIN/NYSTOP) powder Apply 1 application  topically 2 (two) times daily as needed (urogenital area).   OxyCODONE HCl, Abuse Deter, (OXAYDO) 5 MG TABA Take 5 mg by mouth every 6 (six) hours as needed.  Nondisplaced transverse fracture of left patella, subsequent encounter for closed fracture with routine healing   pantoprazole (PROTONIX) 40 MG tablet Take 40 mg by mouth daily.   pramipexole (MIRAPEX) 0.5 MG tablet Take 0.5 mg by mouth at bedtime.   risperiDONE (RISPERDAL) 0.5 MG tablet Take 0.25 mg by mouth 2 (two) times daily.   sennosides-docusate sodium (SENOKOT-S) 8.6-50 MG tablet Take 2 tablets by mouth at bedtime as needed for constipation.   simvastatin (ZOCOR) 10 MG tablet Take 10 mg by mouth at bedtime.   tiotropium (SPIRIVA HANDIHALER) 18 MCG inhalation capsule INHALE CONTENTS OF ONE CAPSULE ONCE DAILY FOR COPD.   triamcinolone (KENALOG) 0.1 % Apply 1 application. topically daily as needed (psoriasis).   No facility-administered encounter medications on file as of 11/09/2021.    Review of Systems  Constitutional:  Positive for fatigue and unexpected weight change. Negative for fever.  HENT:  Positive for hearing loss. Negative for trouble swallowing.   Eyes:  Negative for visual disturbance.  Respiratory:  Negative for cough.        DOE is chronic  Cardiovascular:  Negative for leg swelling.  Gastrointestinal:  Negative for abdominal pain and constipation.       Acid reflux symptoms.   Genitourinary:        SPC  Musculoskeletal:  Positive for arthralgias and gait problem.       S/p left hip ORIF, ORIF of the left patella.   Skin:  Negative for color change.       BLE discoloration, mild erythema left knee.  Neurological:  Positive for seizures. Negative for speech difficulty, weakness and headaches.       Memory lapses. Hx of seizures. RLS  Psychiatric/Behavioral:  Positive for behavioral problems, confusion, dysphoric mood and hallucinations. The patient is  nervous/anxious.     Immunization History  Administered Date(s) Administered   Influenza Split 01/06/2014, 01/15/2017, 01/08/2018, 12/09/2018   Influenza Whole 01/07/2012, 01/06/2013   Influenza, High Dose Seasonal PF 12/25/2015, 01/20/2017   Influenza,inj,Quad PF,6+ Mos 12/21/2014   Influenza-Unspecified  12/09/2018, 01/18/2020, 01/24/2021   Moderna SARS-COV2 Booster Vaccination 04/02/2021   Moderna Sars-Covid-2 Vaccination 04/12/2019, 06/05/2019, 02/15/2020, 09/05/2020   PFIZER(Purple Top)SARS-COV-2 Vaccination 12/26/2020   Pneumococcal Conjugate-13 02/01/2014   Pneumococcal Polysaccharide-23 12/20/1992, 01/15/2000, 04/08/2004, 06/08/2004   Td 04/08/2002, 04/22/2002   Tdap 04/09/2011, 08/17/2015   Zoster Recombinat (Shingrix) 05/29/2005, 08/27/2017   Zoster, Live 04/08/2008, 05/29/2014, 05/28/2017   Zoster, Unspecified 05/29/2005   Pertinent  Health Maintenance Due  Topic Date Due   INFLUENZA VACCINE  11/06/2021   DEXA SCAN  Completed   MAMMOGRAM  Discontinued      09/07/2021    9:30 PM 09/08/2021   12:40 PM 11/04/2021   10:37 AM 11/04/2021   11:24 PM 11/05/2021   10:59 AM  Fall Risk  Patient Fall Risk Level High fall risk High fall risk Moderate fall risk High fall risk High fall risk   Functional Status Survey:    Vitals:   11/09/21 1014  BP: (!) 111/58  Pulse: 76  Resp: 17  Temp: (!) 97.1 F (36.2 C)  SpO2: 90%  Weight: 148 lb (67.1 kg)  Height: '5\' 2"'$  (1.575 m)   Body mass index is 27.07 kg/m. Physical Exam Vitals and nursing note reviewed.  Constitutional:      Comments: tired  HENT:     Head: Normocephalic and atraumatic.     Nose: Nose normal.     Mouth/Throat:     Mouth: Mucous membranes are dry.  Eyes:     Extraocular Movements: Extraocular movements intact.     Conjunctiva/sclera: Conjunctivae normal.     Right eye: Right conjunctiva is not injected.     Left eye: Left conjunctiva is not injected.     Pupils: Pupils are equal, round, and reactive  to light.  Neck:     Comments: Left adam's apple bony aspect, sometimes feels soreness on palpation is chronic, no noted lymph nodes  Cardiovascular:     Rate and Rhythm: Normal rate and regular rhythm.     Heart sounds: Murmur heard.     Comments: PD pulses are not felt from previous examination.  Pulmonary:     Effort: Pulmonary effort is normal.     Breath sounds: Rales present.     Comments: Decreased air entry to both lungs. Bibasilar rales.  Abdominal:     General: Bowel sounds are normal.     Palpations: Abdomen is soft.     Tenderness: There is no abdominal tenderness.     Comments: Mid abd surgical scar  Musculoskeletal:     Cervical back: Normal range of motion and neck supple.     Right lower leg: No edema.     Left lower leg: No edema.     Comments: Decreased overhead ROM of the left shoulder. Left knee s/p ORIF of the patella, mild warmth has no change. S/p left hip ORIF 8 weeks out.   Skin:    General: Skin is warm and dry.     Comments: Brownish skin discoloration BLE.   Neurological:     General: No focal deficit present.     Mental Status: She is alert. Mental status is at baseline.     Gait: Gait abnormal.  Psychiatric:     Comments: Flat affect, low voice     Labs reviewed: Recent Labs    09/02/21 0359 09/04/21 1308 09/06/21 0449 10/23/21 0000 11/02/21 0000 11/04/21 1047  NA 138 144   < > 144 143 145  K 3.2* 3.9   < >  3.2* 4.0 3.2*  CL 104 108   < > 107 109* 109  CO2 27 26   < > 30* 24* 27  GLUCOSE 98 102*  --   --   --  80  BUN 21 31*   < > 20 27* 16  CREATININE 0.78 0.88   < > 0.9 0.8 0.69  CALCIUM 8.5* 8.8*   < > 8.9 9.5 9.6  MG 2.2  --   --   --   --   --    < > = values in this interval not displayed.   Recent Labs    10/12/21 0000 11/02/21 0000 11/04/21 1047  AST '18 19 20  '$ ALT '9 11 12  '$ ALKPHOS 224* 160* 157*  BILITOT  --   --  0.5  PROT  --   --  7.0  ALBUMIN 4.0 3.7 3.6   Recent Labs    09/02/21 0359 09/04/21 1308  09/11/21 0000 10/12/21 0000 11/02/21 0000 11/04/21 1047  WBC 7.8 7.7   < > 4.7 4.3 4.7  NEUTROABS  --   --    < > 2,712.00 2,275.00 3.2  HGB 9.4* 10.0*   < > 12.7 11.6* 12.9  HCT 30.6* 32.5*   < > 40 36 41.6  MCV 85.2 86.4  --   --   --  83.7  PLT 190 239   < > 363 243 252   < > = values in this interval not displayed.   Lab Results  Component Value Date   TSH 1.89 05/22/2021   No results found for: "HGBA1C" Lab Results  Component Value Date   CHOL 147 09/27/2021   HDL 48 09/27/2021   LDLCALC 78 09/27/2021   TRIG 131 09/27/2021   CHOLHDL 3.6 06/27/2015    Significant Diagnostic Results in last 30 days:  DG Chest 2 View  Result Date: 11/04/2021 CLINICAL DATA:  Medical clearance.  Visual hallucinations. EXAM: CHEST - 2 VIEW COMPARISON:  Aug 29, 2021 FINDINGS: Platelike opacities in the bases consistent with atelectasis. Small left pleural effusion versus pleural thickening. The cardiomediastinal silhouette is stable. No pneumothorax. No other acute abnormalities. IMPRESSION: Small left pleural effusion versus pleural thickening. Bibasilar atelectasis. Electronically Signed   By: Dorise Bullion III M.D.   On: 11/04/2021 12:00   CT HEAD WO CONTRAST (5MM)  Result Date: 10/23/2021 CLINICAL DATA:  New onset confusion. History of seizures. Dizziness. EXAM: CT HEAD WITHOUT CONTRAST TECHNIQUE: Contiguous axial images were obtained from the base of the skull through the vertex without intravenous contrast. RADIATION DOSE REDUCTION: This exam was performed according to the departmental dose-optimization program which includes automated exposure control, adjustment of the mA and/or kV according to patient size and/or use of iterative reconstruction technique. COMPARISON:  05/03/2019 FINDINGS: Brain: No evidence of acute infarction, hemorrhage, hydrocephalus, extra-axial collection or mass lesion/mass effect. Cerebral volume loss and chronic small vessel ischemia which is mild for age and  essentially stable from 2021. Vascular: No hyperdense vessel or unexpected calcification. Skull: Remote right mandibular fracturing. Sinuses/Orbits: Bilateral cataract resection. No evidence of injury. IMPRESSION: Aging brain without recent insult or change from 2021. Electronically Signed   By: Jorje Guild M.D.   On: 10/23/2021 07:33    Assessment/Plan Adult failure to thrive Persisted poor oral intake, gradual weight loss since the left hip fx surgery, on Risperdal, Lexapro, Depakote for mood and behaviors. The patient and her HPOA desires Hospice service today.   Depression, psychotic ([place]) Depression, psychotic  featured, saw Psych in ED 11/04/21, paranoid, hallucination, taking Lexapro, Depakote, Risperdal  Suprapubic catheter [facility]) F/u Urology   Generalized osteoarthritis of multiple sites OA in general, on Tylenol, Oxycodone. S/p ORIF left hip 08/29/21, TDWB due to complicated healing. A closed nondisplaced transverse fracture left patella 04/26/20, f/u Ortho s/p  ORIF.  GERD (gastroesophageal reflux disease) ST eval significant reflux as results of mucus production, oropharyngeal dysphagia. Takes Pantoprazole, improved. Hgb 12.9 11/04/21  Restless legs syndrome (RLS) takes MiraPex   Senile dementia (HCC)  on Memantine   Seizure disorder (HCC)  stable, on Keppra, Lamictal  Edema trace, prn Furosemide,  Bun/creat 16/0.69 11/04/21  Hypokalemia K 3.2 11/04/21, on Kcl, f/u BMP scheduled  Essential hypertension Controlled blood pressure, off meds.   COPD (chronic obstructive pulmonary disease) (HCC) takes Spiriva qd, prn ProAir  Slow transit constipation Stable,  takes Senokot     Family/ staff Communication: plan of care reviewed with the patient and charge nurse.   Labs/tests ordered:  none  Time spend 35 minutes.

## 2021-11-09 NOTE — Telephone Encounter (Signed)
        Patient  visited [facility] on 11/05/2021  for uti   Telephone encounter attempt :  2nd left message   A HIPAA compliant voice message was left requesting a return call.  Instructed patient to call back at [phone].  [place] [phone] [address] , [place] [address] Email : [email or web address]

## 2021-11-12 ENCOUNTER — Telehealth: Payer: Self-pay | Admitting: *Deleted

## 2021-11-12 NOTE — Telephone Encounter (Signed)
        Patient  visited [facility] on 11/05/2021  for uti   Telephone encounter attempt :  3rd Left message 3x times will close follow up   A HIPAA compliant voice message was left requesting a return call.  Instructed patient to call back at [place] [phone] [address] , [place] [address] Email : [email or web address]

## 2021-11-13 DIAGNOSIS — I10 Essential (primary) hypertension: Secondary | ICD-10-CM | POA: Diagnosis not present

## 2021-11-13 LAB — BASIC METABOLIC PANEL
BUN: 28 — AB (ref 4–21)
CO2: 28 — AB (ref 13–22)
Chloride: 103 (ref 99–108)
Creatinine: 0.7 (ref 0.5–1.1)
Glucose: 52
Potassium: 3.7 mEq/L (ref 3.5–5.1)
Sodium: 144 (ref 137–147)

## 2021-11-13 LAB — COMPREHENSIVE METABOLIC PANEL
Calcium: 9.3 (ref 8.7–10.7)
eGFR: 84

## 2021-11-14 ENCOUNTER — Other Ambulatory Visit: Payer: Medicare PPO

## 2021-11-15 DIAGNOSIS — E785 Hyperlipidemia, unspecified: Secondary | ICD-10-CM | POA: Diagnosis not present

## 2021-11-23 ENCOUNTER — Non-Acute Institutional Stay (SKILLED_NURSING_FACILITY): Payer: Medicare PPO | Admitting: Nurse Practitioner

## 2021-11-23 ENCOUNTER — Encounter: Payer: Self-pay | Admitting: Nurse Practitioner

## 2021-11-23 DIAGNOSIS — F0390 Unspecified dementia without behavioral disturbance: Secondary | ICD-10-CM

## 2021-11-23 DIAGNOSIS — R627 Adult failure to thrive: Secondary | ICD-10-CM

## 2021-11-23 DIAGNOSIS — F323 Major depressive disorder, single episode, severe with psychotic features: Secondary | ICD-10-CM

## 2021-11-23 DIAGNOSIS — Z9359 Other cystostomy status: Secondary | ICD-10-CM | POA: Diagnosis not present

## 2021-11-23 DIAGNOSIS — G2581 Restless legs syndrome: Secondary | ICD-10-CM

## 2021-11-23 DIAGNOSIS — G40909 Epilepsy, unspecified, not intractable, without status epilepticus: Secondary | ICD-10-CM

## 2021-11-23 DIAGNOSIS — J431 Panlobular emphysema: Secondary | ICD-10-CM

## 2021-11-23 DIAGNOSIS — I10 Essential (primary) hypertension: Secondary | ICD-10-CM

## 2021-11-23 NOTE — Assessment & Plan Note (Signed)
stable, no s/s of psychosis, prn Lorazepam is adequate  

## 2021-11-23 NOTE — Assessment & Plan Note (Signed)
Suprapubic Catheter

## 2021-11-23 NOTE — Assessment & Plan Note (Signed)
no active seizures, off meds.  

## 2021-11-23 NOTE — Assessment & Plan Note (Signed)
seems better in mood, prn Lorazepam used a few times, no Haldol or Morphine needed-dc

## 2021-11-23 NOTE — Assessment & Plan Note (Signed)
Resume MiraPex.

## 2021-11-23 NOTE — Progress Notes (Unsigned)
Location:  [place] Room Number: NO/29/A Place of Service:  SNF (31) Provider:  Dreanna Kyllo X, NP  [name], MD  Patient Care Team: [name], MD as PCP - General (Internal Medicine) [name], MD as Consulting Physician (Ophthalmology) [name], MD as Consulting Physician (Cardiology) [name], MD as Consulting Physician (Dermatology) [name], MD as Consulting Physician (Orthopedic Surgery) [name], MD as Consulting Physician (Orthopedic Surgery) [name], MD as Consulting Physician (Orthopedic Surgery) [place], [place], Jenniger Figiel X, NP as Nurse Practitioner (Nurse Practitioner) [name], MD (Inactive) as Consulting Physician (Neurology)  Extended Emergency Contact Information Primary Emergency Contact: [name],[name] Address: [address]          [place], [place] [address] [name] of [place] Phone: [phone] Relation: Sister Secondary Emergency Contact: [name] [place] of [place] Phone: [phone] Mobile Phone: [phone] Relation: Sister  Code Status:  DNR Goals of care: Advanced Directive information    11/09/2021   10:15 AM  Advanced Directives  Does Patient Have a Medical Advance Directive? No  Does patient want to make changes to medical advance directive? No - Patient declined  Would patient like information on creating a medical advance directive? No - Patient declined  Pre-existing out of facility DNR order (yellow form or pink MOST form) Pink Most/Yellow Form available - Physician notified to receive inpatient order     Chief Complaint  Patient presents with  . Acute Visit    Patient is being seen for restless leg syndrome    HPI:  Pt is a 88 y.o. female seen today for c/o flare up restless syndrome    Adult failure to thrive, seems better in mood, prn Lorazepam used a few times, no Haldol or Morphine needed.    Suprapubic Catheter  Depression, stable, no s/s of psychosis, prn Lorazepam is adequate   Dementia, under hospice service  Restless leg syndrome, wants to resume MiraPex.   Hx of seizures, no active seizures, off meds.   COPD, on ProAir  Past Medical History:  Diagnosis Date  . Acute bronchitis 05/23/2011  . Acute upper respiratory infections of unspecified site 05/23/2011  . Arthritis   . Chronic airway obstruction, not elsewhere classified 05/23/2011  . Disturbance of salivary secretion 01/31/2011  . Dizziness and giddiness 01/31/2011  . Dyspnea   . Essential tremor 04/25/2014  . External hemorrhoids without mention of complication 02/54/2706  . Gait disorder 04/25/2014  . Insomnia, unspecified 09/12/2011  . Lumbago 01/31/2011  . Major depressive disorder, single episode, unspecified 01/31/2011  . Memory disorder 04/25/2014  . Mitral valve disorders(424.0) 01/31/2011  . Other and unspecified hyperlipidemia 01/31/2011  . Other convulsions 01/31/2011  . Other emphysema ([place]) 01/31/2011  . Pain in joint, site unspecified 01/31/2011  . Restless legs syndrome (RLS) 09/12/2011  . Retinal detachment with retinal defect of right eye 2011   right eye twice  . Seizure disorder ([place])   . Senile osteoporosis 01/31/2011  . Spontaneous ecchymoses 01/31/2011  . Stiffness of joints, not elsewhere classified, multiple sites 01/31/2011  . Unspecified essential hypertension 01/31/2011   Past Surgical History:  Procedure Laterality Date  . ABDOMINAL HYSTERECTOMY  06/21/2003   TAH/BSO, omenectomy PSB resect, Stg IC cystadenofibroma  . CHOLECYSTECTOMY  2005   Dr. Marlou Starks  . ELBOW SURGERY Right 2008   broken   Dr. Apolonio Schneiders  . EYE SURGERY    .  INTRAMEDULLARY (IM) NAIL INTERTROCHANTERIC Left 08/30/2021   Procedure: INTRAMEDULLARY (IM) NAIL INTERTROCHANTRIC;  Surgeon: Rod Can, MD;  Location: [facility];  Service: Orthopedics;  Laterality: Left;  . ORIF PATELLA Left 05/02/2020   Procedure: OPEN  REDUCTION INTERNAL (ORIF) FIXATION LEFT PATELLA WITH MEDIAL AND LATERAL LIGAMENT REINFORCEMENTS;  Surgeon: [name], MD;  Location: [facility];  Service: Orthopedics;  Laterality: Left;  . ORIF PATELLA Left 05/30/2020   Procedure: OPEN REDUCTION INTERNAL (ORIF) FIXATION PATELLA;  Surgeon: [name], MD;  Location: [facility];  Service: Orthopedics;  Laterality: Left;  . RETINAL DETACHMENT SURGERY N/A    two  . REVERSE SHOULDER ARTHROPLASTY Left 05/06/2019   Procedure: REVERSE SHOULDER ARTHROPLASTY;  Surgeon: Justice Britain, MD;  Location: [facility];  Service: Orthopedics;  Laterality: Left;  153mn  . ROTATOR CUFF REPAIR Right 2012   Dr. CTheda Sers . SQUAMOUS CELL CARCINOMA EXCISION Bilateral 2012, 8/14   [place] on legs   Dr. GSarajane Jews . TONSILLECTOMY  1941  . VIDEO BRONCHOSCOPY WITH ENDOBRONCHIAL NAVIGATION N/A 11/29/2015   Procedure: VIDEO BRONCHOSCOPY WITH ENDOBRONCHIAL NAVIGATION;  Surgeon: RCollene Gobble MD;  Location: [facility];  Service: Thoracic;  Laterality: N/A;    Allergies  Allergen Reactions  . Vioxx [Rofecoxib] Shortness Of Breath  . Dyazide [Hydrochlorothiazide W-Triamterene] Other (See Comments)    Lowers blood pressure too much  . Latex Swelling and Other (See Comments)    "Allergic," per MAR  . Sulfa Antibiotics Nausea And Vomiting and Other (See Comments)    "Allergic," per MAR  . Sumycin [Tetracycline] Other (See Comments)    [facility] take due to drug reaction- [facility]   Outpatient Encounter Medications as of 11/23/2021  Medication Sig  . acetaminophen (TYLENOL) 500 MG tablet Take 1,000 mg by mouth every 6 (six) hours as needed (for pain).  . Albuterol Sulfate (PROAIR RESPICLICK) 1161(90 Base) MCG/ACT AEPB Inhale 2 puffs into the lungs See admin instructions. Inhale 2 puffs into the lungs every morning as needed for shortness of breath or wheezing  . Cholecalciferol (VITAMIN D3) 25 MCG (1000 UT) CAPS Take 1,000 Units by mouth daily.  . divalproex (DEPAKOTE  SPRINKLE) 125 MG capsule Take 125 mg by mouth 3 (three) times daily.  .Marland Kitchenescitalopram (LEXAPRO) 10 MG tablet Take 20 mg by mouth daily.  . hydrocortisone 2.5 % lotion Apply 1 application  topically 2 (two) times daily as needed (to scaly areas of face and ears).  . hydrocortisone cream 1 % Apply 1 application  topically 2 (two) times daily as needed for itching (to scaly areas of face and ears).  .Marland Kitchenketoconazole (NIZORAL) 2 % cream Apply 1 application  topically daily as needed (to affected areas for psoriasis).  .Marland Kitchenketoconazole (NIZORAL) 2 % shampoo Apply 1 application  topically See admin instructions. Shampoo on Mondays and Thursdays  . lamoTRIgine (LAMICTAL) 150 MG tablet Take 150 mg by mouth 2 (two) times daily.  .Marland KitchenlevETIRAcetam (KEPPRA) 250 MG tablet Take 250 mg by mouth daily. In the morning  . levETIRAcetam (KEPPRA) 500 MG tablet Take 500 mg by mouth at bedtime.  .Marland KitchenLORazepam (LORAZEPAM INTENSOL) 2 MG/ML concentrated solution 0.5 mg/ml gel apply to skin twice a day as needed  . memantine (NAMENDA) 10 MG tablet TAKE 1 TABLET BY MOUTH TWICE DAILY.  .Marland Kitchennystatin (MYCOSTATIN/NYSTOP) powder Apply 1 application  topically 2 (two) times daily as needed (urogenital area).  . OxyCODONE HCl, Abuse Deter, (OXAYDO) 5 MG TABA Take 5  mg by mouth every 6 (six) hours as needed.  Nondisplaced transverse fracture of left patella, subsequent encounter for closed fracture with routine healing  . pantoprazole (PROTONIX) 40 MG tablet Take 40 mg by mouth daily.  . pramipexole (MIRAPEX) 0.5 MG tablet Take 0.5 mg by mouth at bedtime.  . risperiDONE (RISPERDAL) 0.5 MG tablet Take 0.25 mg by mouth 2 (two) times daily.  . sennosides-docusate sodium (SENOKOT-S) 8.6-50 MG tablet Take 2 tablets by mouth at bedtime as needed for constipation.  . simvastatin (ZOCOR) 10 MG tablet Take 10 mg by mouth at bedtime.  [name] tiotropium (SPIRIVA HANDIHALER) 18 MCG inhalation capsule INHALE CONTENTS OF ONE CAPSULE ONCE DAILY FOR COPD.  [name]  triamcinolone (KENALOG) 0.1 % Apply 1 application. topically daily as needed (psoriasis).   No facility-administered encounter medications on file as of 11/23/2021.    Review of Systems  Constitutional:  Positive for fatigue and unexpected weight change. Negative for fever.  HENT:  Positive for hearing loss. Negative for trouble swallowing.   Eyes:  Negative for visual disturbance.  Respiratory:  Negative for cough.        DOE is chronic  Cardiovascular:  Negative for leg swelling.  Gastrointestinal:  Negative for abdominal pain and constipation.       Acid reflux symptoms.   Genitourinary:        SPC  Musculoskeletal:  Positive for arthralgias and gait problem.       S/p left hip ORIF, ORIF of the left patella.   Skin:  Negative for color change.       BLE discoloration, mild erythema left knee.  Neurological:  Negative for seizures, speech difficulty, weakness and headaches.       Memory lapses. Hx of seizures. RLS  Psychiatric/Behavioral:  Positive for confusion and dysphoric mood. Negative for behavioral problems, hallucinations and sleep disturbance. The patient is not nervous/anxious.     Immunization History  Administered Date(s) Administered  . Influenza Split 01/06/2014, 01/15/2017, 01/08/2018, 12/09/2018  . Influenza Whole 01/07/2012, 01/06/2013  . Influenza, High Dose Seasonal PF 12/25/2015, 01/20/2017  . Influenza,inj,Quad PF,6+ Mos 12/21/2014  . Influenza-Unspecified 12/09/2018, 01/18/2020, 01/24/2021  . Moderna SARS-COV2 Booster Vaccination 04/02/2021  . Moderna Sars-Covid-2 Vaccination 04/12/2019, 06/05/2019, 02/15/2020, 09/05/2020  . PFIZER(Purple Top)SARS-COV-2 Vaccination 12/26/2020  . Pneumococcal Conjugate-13 02/01/2014  . Pneumococcal Polysaccharide-23 12/20/1992, 01/15/2000, 04/08/2004, 06/08/2004  . Td 04/08/2002, 04/22/2002  . Tdap 04/09/2011, 08/17/2015  . Zoster Recombinat (Shingrix) 05/29/2005, 08/27/2017  . Zoster, Live 04/08/2008, 05/29/2014,  05/28/2017  . Zoster, Unspecified 05/29/2005   Pertinent  Health Maintenance Due  Topic Date Due  . INFLUENZA VACCINE  11/06/2021  . DEXA SCAN  Completed  . MAMMOGRAM  Discontinued      09/07/2021    9:30 PM 09/08/2021   12:40 PM 11/04/2021   10:37 AM 11/04/2021   11:24 PM 11/05/2021   10:59 AM  Fall Risk  Patient Fall Risk Level High fall risk High fall risk Moderate fall risk High fall risk High fall risk   Functional Status Survey:    Vitals:   11/23/21 1113  BP: (!) 148/91  Pulse: 90  Resp: 16  Temp: (!) 96.8 F (36 C)  SpO2: 90%  Weight: 148 lb (67.1 kg)  Height: '5\' 2"'$  (1.575 m)   Body mass index is 27.07 kg/m. Physical Exam Vitals and nursing note reviewed.  Constitutional:      Comments: tired  HENT:     Head: Normocephalic and atraumatic.     Nose: Nose  normal.     Mouth/Throat:     Mouth: Mucous membranes are dry.  Eyes:     Extraocular Movements: Extraocular movements intact.     Conjunctiva/sclera: Conjunctivae normal.     Right eye: Right conjunctiva is not injected.     Left eye: Left conjunctiva is not injected.     Pupils: Pupils are equal, round, and reactive to light.  Neck:     Comments: Left adam's apple bony aspect, sometimes feels soreness on palpation is chronic, no noted lymph nodes  Cardiovascular:     Rate and Rhythm: Normal rate and regular rhythm.     Heart sounds: Murmur heard.     Comments: PD pulses are not felt from previous examination.  Pulmonary:     Effort: Pulmonary effort is normal.     Breath sounds: Rales present.     Comments: Decreased air entry to both lungs. Bibasilar rales.  Abdominal:     General: Bowel sounds are normal.     Palpations: Abdomen is soft.     Tenderness: There is no abdominal tenderness.     Comments: Mid abd surgical scar  Musculoskeletal:     Cervical back: Normal range of motion and neck supple.     Right lower leg: No edema.     Left lower leg: No edema.     Comments: Decreased overhead ROM  of the left shoulder. Left knee s/p ORIF of the patella, mild warmth has no change. S/p left hip ORIF 8 weeks out.   Skin:    General: Skin is warm and dry.     Comments: Brownish skin discoloration BLE.   Neurological:     General: No focal deficit present.     Mental Status: She is alert. Mental status is at baseline.     Gait: Gait abnormal.  Psychiatric:     Comments: Smiled, conversed appropriately during my visit today.     Labs reviewed: Recent Labs    09/02/21 0359 09/04/21 1308 09/06/21 0449 10/23/21 0000 11/02/21 0000 11/04/21 1047  NA 138 144   < > 144 143 145  K 3.2* 3.9   < > 3.2* 4.0 3.2*  CL 104 108   < > 107 109* 109  CO2 27 26   < > 30* 24* 27  GLUCOSE 98 102*  --   --   --  80  BUN 21 31*   < > 20 27* 16  CREATININE 0.78 0.88   < > 0.9 0.8 0.69  CALCIUM 8.5* 8.8*   < > 8.9 9.5 9.6  MG 2.2  --   --   --   --   --    < > = values in this interval not displayed.   Recent Labs    10/12/21 0000 11/02/21 0000 11/04/21 1047  AST '18 19 20  '$ ALT '9 11 12  '$ ALKPHOS 224* 160* 157*  BILITOT  --   --  0.5  PROT  --   --  7.0  ALBUMIN 4.0 3.7 3.6   Recent Labs    09/02/21 0359 09/04/21 1308 09/11/21 0000 10/12/21 0000 11/02/21 0000 11/04/21 1047  WBC 7.8 7.7   < > 4.7 4.3 4.7  NEUTROABS  --   --    < > 2,712.00 2,275.00 3.2  HGB 9.4* 10.0*   < > 12.7 11.6* 12.9  HCT 30.6* 32.5*   < > 40 36 41.6  MCV 85.2 86.4  --   --   --  83.7  PLT 190 239   < > 363 243 252   < > = values in this interval not displayed.   Lab Results  Component Value Date   TSH 1.89 05/22/2021   No results found for: "HGBA1C" Lab Results  Component Value Date   CHOL 147 09/27/2021   HDL 48 09/27/2021   LDLCALC 78 09/27/2021   TRIG 131 09/27/2021   CHOLHDL 3.6 06/27/2015    Significant Diagnostic Results in last 30 days:  DG Chest 2 View  Result Date: 11/04/2021 CLINICAL DATA:  Medical clearance.  Visual hallucinations. EXAM: CHEST - 2 VIEW COMPARISON:  Aug 29, 2021  FINDINGS: Platelike opacities in the bases consistent with atelectasis. Small left pleural effusion versus pleural thickening. The cardiomediastinal silhouette is stable. No pneumothorax. No other acute abnormalities. IMPRESSION: Small left pleural effusion versus pleural thickening. Bibasilar atelectasis. Electronically Signed   By: Dorise Bullion III M.D.   On: 11/04/2021 12:00    Assessment/Plan Adult failure to thrive seems better in mood, prn Lorazepam used a few times, no Haldol or Morphine needed-dc  Suprapubic catheter (HCC) Suprapubic Catheter  Depression, psychotic (HCC) stable, no s/s of psychosis, prn Lorazepam is adequate   Restless legs syndrome (RLS) Resume MiraPex.   Senile dementia ([place]) SNF [facility] for supportive care, under hospice service  Seizure disorder [facility]) no active seizures, off meds.   COPD (chronic obstructive pulmonary disease) (HCC) Stable, on ProAir  Essential hypertension Mildly elevated, asymptomatic today, will monitor Bp     Family/ staff Communication: plan of care reviewed with the patient and charge nurse.   Labs/tests ordered:  none  Time spend 35 minutes.

## 2021-11-23 NOTE — Assessment & Plan Note (Signed)
SNF [facility] for supportive care, under hospice service

## 2021-11-23 NOTE — Assessment & Plan Note (Signed)
Stable, on ProAir 

## 2021-11-26 NOTE — Assessment & Plan Note (Signed)
Mildly elevated, asymptomatic today, will monitor Bp

## 2021-12-04 ENCOUNTER — Encounter: Payer: Self-pay | Admitting: Nurse Practitioner

## 2021-12-04 ENCOUNTER — Non-Acute Institutional Stay (SKILLED_NURSING_FACILITY): Payer: Medicare PPO | Admitting: Nurse Practitioner

## 2021-12-04 DIAGNOSIS — G2581 Restless legs syndrome: Secondary | ICD-10-CM | POA: Diagnosis not present

## 2021-12-04 DIAGNOSIS — G40909 Epilepsy, unspecified, not intractable, without status epilepticus: Secondary | ICD-10-CM

## 2021-12-04 DIAGNOSIS — L8992 Pressure ulcer of unspecified site, stage 2: Secondary | ICD-10-CM | POA: Insufficient documentation

## 2021-12-04 DIAGNOSIS — F0390 Unspecified dementia without behavioral disturbance: Secondary | ICD-10-CM | POA: Diagnosis not present

## 2021-12-04 DIAGNOSIS — J431 Panlobular emphysema: Secondary | ICD-10-CM

## 2021-12-04 NOTE — Assessment & Plan Note (Signed)
Adult failure to thrive, seems better in mood, prn Lorazepam used a few times, no Haldol or Morphine needed.

## 2021-12-04 NOTE — Progress Notes (Deleted)
Location:   [place] Room Number: 76 A Place of Service:  SNF (31) Provider:  Mast, Man X, NP  [name], MD  Patient Care Team: [name], MD as PCP - General (Internal Medicine) [name], MD as Consulting Physician (Ophthalmology) [name], MD as Consulting Physician (Cardiology) [name], MD as Consulting Physician (Dermatology) [name], MD as Consulting Physician (Orthopedic Surgery) [name], MD as Consulting Physician (Orthopedic Surgery) [name], MD as Consulting Physician (Orthopedic Surgery) [place], [place], Man X, NP as Nurse Practitioner (Nurse Practitioner) [name], MD (Inactive) as Consulting Physician (Neurology)  Extended Emergency Contact Information Primary Emergency Contact: [name],[name] Address: [address]          [place], [place] [address] [name] of [place] Phone: [phone] Relation: Sister Secondary Emergency Contact: [name] [place] of [place] Phone: [phone] Mobile Phone: [phone] Relation: Sister  Code Status:  DNR Goals of care: Advanced Directive information    12/04/2021    2:51 PM  Advanced Directives  Does Patient Have a Medical Advance Directive? Yes  Type of Advance Directive Out of facility DNR (pink MOST or yellow form)  Does patient want to make changes to medical advance directive? No - Patient declined  Pre-existing out of facility DNR order (yellow form or pink MOST form) Pink MOST form placed in chart (order not valid for inpatient use);Yellow form placed in chart (order not valid for inpatient use)     Chief Complaint  Patient presents with   Acute Visit    Mid back pressure sore    HPI:  Pt is a 88 y.o. female seen today for medical management of chronic diseases.     Past Medical History:  Diagnosis Date   Acute bronchitis 05/23/2011   Acute upper respiratory  infections of unspecified site 05/23/2011   Arthritis    Chronic airway obstruction, not elsewhere classified 05/23/2011   Disturbance of salivary secretion 01/31/2011   Dizziness and giddiness 01/31/2011   Dyspnea    Essential tremor 04/25/2014   External hemorrhoids without mention of complication 81/04/7508   Gait disorder 04/25/2014   Insomnia, unspecified 09/12/2011   Lumbago 01/31/2011   Major depressive disorder, single episode, unspecified 01/31/2011   Memory disorder 04/25/2014   Mitral valve disorders(424.0) 01/31/2011   Other and unspecified hyperlipidemia 01/31/2011   Other convulsions 01/31/2011   Other emphysema ([place]) 01/31/2011   Pain in joint, site unspecified 01/31/2011   Restless legs syndrome (RLS) 09/12/2011   Retinal detachment with retinal defect of right eye 2011   right eye twice   Seizure disorder ([place])    Senile osteoporosis 01/31/2011   Spontaneous ecchymoses 01/31/2011   Stiffness of joints, not elsewhere classified, multiple sites 01/31/2011   Unspecified essential hypertension 01/31/2011   Past Surgical History:  Procedure Laterality Date   ABDOMINAL HYSTERECTOMY  06/21/2003   TAH/BSO, omenectomy PSB resect, Stg IC cystadenofibroma   CHOLECYSTECTOMY  2005   Dr. Marlou Starks   ELBOW SURGERY Right 2008   broken   Dr. Apolonio Schneiders   EYE SURGERY     INTRAMEDULLARY (IM) NAIL INTERTROCHANTERIC Left 08/30/2021   Procedure: INTRAMEDULLARY (IM) NAIL INTERTROCHANTRIC;  Surgeon: Rod Can, MD;  Location: [facility];  Service: Orthopedics;  Laterality: Left;   ORIF PATELLA Left 05/02/2020   Procedure: OPEN REDUCTION INTERNAL (ORIF) FIXATION LEFT PATELLA WITH MEDIAL AND LATERAL LIGAMENT REINFORCEMENTS;  Surgeon: [name], MD;  Location: [facility];  Service: Orthopedics;  Laterality: Left;   ORIF PATELLA Left 05/30/2020   Procedure: OPEN REDUCTION INTERNAL (ORIF) FIXATION PATELLA;  Surgeon: [name], MD;  Location: [facility];  Service: Orthopedics;  Laterality: Left;    RETINAL DETACHMENT SURGERY N/A    two   REVERSE SHOULDER ARTHROPLASTY Left 05/06/2019   Procedure: REVERSE SHOULDER ARTHROPLASTY;  Surgeon: Justice Britain, MD;  Location: [facility];  Service: Orthopedics;  Laterality: Left;  161mn   ROTATOR CUFF REPAIR Right 2012   Dr. CTheda Sers  SQUAMOUS CELL CARCINOMA EXCISION Bilateral 2012, 8/14   [place] on legs   Dr. GSarajane Jews  TONSILLECTOMY  1941   VIDEO BRONCHOSCOPY WITH ENDOBRONCHIAL NAVIGATION N/A 11/29/2015   Procedure: VIDEO BRONCHOSCOPY WITH ENDOBRONCHIAL NAVIGATION;  Surgeon: RCollene Gobble MD;  [facility]  Service: Thoracic;  Laterality: N/A;    Allergies  Allergen Reactions   Vioxx [Rofecoxib] Shortness Of Breath   Dyazide [Hydrochlorothiazide W-Triamterene] Other (See Comments)    Lowers blood pressure too much   Latex Swelling and Other (See Comments)    "Allergic," per [facility]   Sulfa Antibiotics Nausea And Vomiting and Other (See Comments)    [facility]  Sumycin [Tetracycline] Other (See Comments)    Can't take due to drug reaction- [facility]   Allergies as of 12/04/2021       Reactions   Vioxx [rofecoxib] Shortness Of Breath   Dyazide [hydrochlorothiazide W-triamterene] Other (See Comments)   Lowers blood pressure too much   Latex Swelling, Other (See Comments)   "Allergic," per MAR   Sulfa Antibiotics Nausea And Vomiting, Other (See Comments)   [facility]  Sumycin [tetracycline] Other (See Comments)   Can[facility] take due to drug reaction- [facility]       Medication List        Accurate as of December 04, 2021  2:56 PM. If you have any questions, ask your nurse or doctor.          STOP taking these medications    acetaminophen 500 MG tablet Commonly known as: TYLENOL Stopped by: Man X Mast, NP   divalproex 125 MG capsule Commonly known as: DEPAKOTE SPRINKLE Stopped by: Man X Mast, NP   escitalopram 10 MG tablet Commonly known as: LEXAPRO Stopped by: Man X Mast, NP    lamoTRIgine 150 MG tablet Commonly known as: LAMICTAL Stopped by: Man X Mast, NP   levETIRAcetam 250 MG tablet Commonly known as: KEPPRA Stopped by: Man X Mast, NP   levETIRAcetam 500 MG tablet Commonly known as: KEPPRA Stopped by: Man X Mast, NP   memantine 10 MG tablet Commonly known as: NAMENDA Stopped by: Man X Mast, NP   OxyCODONE HCl (Abuse Deter) 5 MG Taba Commonly known as: OXAYDO Stopped by: Man X Mast, NP   pantoprazole 40 MG tablet Commonly known as: PROTONIX Stopped by: Man X Mast, NP   risperiDONE 0.5 MG tablet Commonly known as: RISPERDAL Stopped by: Man X Mast, NP   sennosides-docusate sodium 8.6-50 MG tablet Commonly known as: SENOKOT-S Stopped by: Man X Mast, NP   simvastatin 10 MG tablet Commonly known as: ZOCOR Stopped by: Man X Mast, NP   tiotropium 18 MCG inhalation capsule Commonly known as: Spiriva HandiHaler Stopped by: Man X Mast, NP   Vitamin D3 25 MCG (1000 UT) Caps Stopped by: Man X Mast, NP  TAKE these medications    hydrocortisone cream 1 % Apply 1 application  topically 2 (two) times daily as needed for itching (to scaly areas of face and ears).   hydrocortisone 2.5 % lotion Apply 1 application  topically 2 (two) times daily as needed (to scaly areas of face and ears).   ketoconazole 2 % shampoo Commonly known as: NIZORAL Apply 1 application  topically See admin instructions. Shampoo on Mondays and Thursdays   ketoconazole 2 % cream Commonly known as: NIZORAL Apply 1 application  topically daily as needed (to affected areas for psoriasis).   LORazepam 1 MG tablet Commonly known as: ATIVAN Take 1 mg by mouth every 4 (four) hours as needed for anxiety. What changed: Another medication with the same name was removed. Continue taking this medication, and follow the directions you see here. Changed by: Man X Mast, NP   nystatin powder Commonly known as: MYCOSTATIN/NYSTOP Apply 1 application  topically 2 (two) times  daily as needed (urogenital area).   pramipexole 0.5 MG tablet Commonly known as: MIRAPEX Take 0.5 mg by mouth at bedtime.   ProAir RespiClick 809 (90 Base) MCG/ACT Aepb Generic drug: Albuterol Sulfate Inhale 2 puffs into the lungs See admin instructions. Inhale 2 puffs into the lungs every morning as needed for shortness of breath or wheezing   triamcinolone cream 0.1 % Commonly known as: KENALOG Apply 1 application. topically daily as needed (psoriasis).        Review of Systems  Immunization History  Administered Date(s) Administered   Influenza Split 01/06/2014, 01/15/2017, 01/08/2018, 12/09/2018   Influenza Whole 01/07/2012, 01/06/2013   Influenza, High Dose Seasonal PF 12/25/2015, 01/20/2017   Influenza,inj,Quad PF,6+ Mos 12/21/2014   Influenza-Unspecified 12/09/2018, 01/18/2020, 01/24/2021   Moderna SARS-COV2 Booster Vaccination 04/02/2021   Moderna Sars-Covid-2 Vaccination 04/12/2019, 06/05/2019, 02/15/2020, 09/05/2020   PFIZER(Purple Top)SARS-COV-2 Vaccination 12/26/2020   Pneumococcal Conjugate-13 02/01/2014   Pneumococcal Polysaccharide-23 12/20/1992, 01/15/2000, 04/08/2004, 06/08/2004   Td 04/08/2002, 04/22/2002   Tdap 04/09/2011, 08/17/2015   Zoster Recombinat (Shingrix) 05/29/2005, 08/27/2017   Zoster, Live 04/08/2008, 05/29/2014, 05/28/2017   Zoster, Unspecified 05/29/2005   Pertinent  Health Maintenance Due  Topic Date Due   INFLUENZA VACCINE  11/06/2021   DEXA SCAN  Completed   MAMMOGRAM  Discontinued      09/07/2021    9:30 PM 09/08/2021   12:40 PM 11/04/2021   10:37 AM 11/04/2021   11:24 PM 11/05/2021   10:59 AM  Fall Risk  Patient Fall Risk Level High fall risk High fall risk Moderate fall risk High fall risk High fall risk   Functional Status Survey:    Vitals:   12/04/21 1352  BP: 112/74  Pulse: 88  Resp: 16  Temp: (!) 96.9 F (36.1 C)  SpO2: 96%  Weight: 148 lb 14.4 oz (67.5 kg)  Height: '5\' 2"'$  (1.575 m)   Body mass index is 27.23  kg/m. Physical Exam  Labs reviewed: Recent Labs    09/02/21 0359 09/04/21 1308 09/06/21 0449 11/02/21 0000 11/04/21 1047 11/13/21 0000  NA 138 144   < > 143 145 144  K 3.2* 3.9   < > 4.0 3.2* 3.7  CL 104 108   < > 109* 109 103  CO2 27 26   < > 24* 27 28*  GLUCOSE 98 102*  --   --  80  --   BUN 21 31*   < > 27* 16 28*  CREATININE 0.78 0.88   < > 0.8 0.69 0.7  CALCIUM 8.5* 8.8*   < > 9.5 9.6 9.3  MG 2.2  --   --   --   --   --    < > = values in this interval not displayed.   Recent Labs    10/12/21 0000 11/02/21 0000 11/04/21 1047  AST '18 19 20  '$ ALT '9 11 12  '$ ALKPHOS 224* 160* 157*  BILITOT  --   --  0.5  PROT  --   --  7.0  ALBUMIN 4.0 3.7 3.6   Recent Labs    09/02/21 0359 09/04/21 1308 09/11/21 0000 10/12/21 0000 11/02/21 0000 11/04/21 1047  WBC 7.8 7.7   < > 4.7 4.3 4.7  NEUTROABS  --   --    < > 2,712.00 2,275.00 3.2  HGB 9.4* 10.0*   < > 12.7 11.6* 12.9  HCT 30.6* 32.5*   < > 40 36 41.6  MCV 85.2 86.4  --   --   --  83.7  PLT 190 239   < > 363 243 252   < > = values in this interval not displayed.   Lab Results  Component Value Date   TSH 1.89 05/22/2021   No results found for: "HGBA1C" Lab Results  Component Value Date   CHOL 147 09/27/2021   HDL 48 09/27/2021   LDLCALC 78 09/27/2021   TRIG 131 09/27/2021   CHOLHDL 3.6 06/27/2015    Significant Diagnostic Results in last 30 days:  No results found.  Assessment/Plan 1. Senile dementia ([place]) ***    Family/ staff Communication:   Labs/tests ordered:

## 2021-12-04 NOTE — Progress Notes (Unsigned)
Location:   SNF [place] Room Number: 22 A Place of Service:  SNF (31) Provider: [facility] Jaaron Oleson NP  [name], MD  Patient Care Team: [name], MD as PCP - General (Internal Medicine) [name], MD as Consulting Physician (Ophthalmology) [name], MD as Consulting Physician (Cardiology) [name], MD as Consulting Physician (Dermatology) [name], MD as Consulting Physician (Orthopedic Surgery) [name], MD as Consulting Physician (Orthopedic Surgery) [name], MD as Consulting Physician (Orthopedic Surgery) [place], [place], [place] Fredricksen X, NP as Nurse Practitioner (Nurse Practitioner) [name], MD (Inactive) as Consulting Physician (Neurology)  Extended Emergency Contact Information Primary Emergency Contact: [name],[name] Address: [address]          [place], [place] [address] [name] of [place] Phone: [phone] Relation: Sister Secondary Emergency Contact: [name] [place] of [place] Phone: [phone] Mobile Phone: [phone] Relation: Sister  Code Status: DNR Goals of care: Advanced Directive information    12/04/2021    2:51 PM  Advanced Directives  Does Patient Have a Medical Advance Directive? Yes  Type of Advance Directive Out of facility DNR (pink MOST or yellow form)  Does patient want to make changes to medical advance directive? No - Patient declined  Pre-existing out of facility DNR order (yellow form or pink MOST form) Pink MOST form placed in chart (order not valid for inpatient use);Yellow form placed in chart (order not valid for inpatient use)     Chief Complaint  Patient presents with   Acute Visit    Mid back pressure sore    HPI:  Pt is a 88 y.o. female seen today for an acute visit for mid back pressure area, no s/s of infection, covered with dry dressing.    Adult failure to thrive, seems better in mood, prn Lorazepam used  a few times, no Haldol or Morphine needed.              Suprapubic Catheter             Depression, stable, no s/s of psychosis, prn Lorazepam is adequate              Dementia, under hospice service             Restless leg syndrome, wants to resume MiraPex.              Hx of seizures, no active seizures, off meds.              COPD, on ProAir   Past Medical History:  Diagnosis Date   Acute bronchitis 05/23/2011   Acute upper respiratory infections of unspecified site 05/23/2011   Arthritis    Chronic airway obstruction, not elsewhere classified 05/23/2011   Disturbance of salivary secretion 01/31/2011   Dizziness and giddiness 01/31/2011   Dyspnea    Essential tremor 04/25/2014   External hemorrhoids without mention of complication 27/25/3664   Gait disorder 04/25/2014   Insomnia, unspecified 09/12/2011   Lumbago 01/31/2011   Major depressive disorder, single episode, unspecified 01/31/2011   Memory disorder 04/25/2014   Mitral valve disorders(424.0) 01/31/2011   Other and unspecified hyperlipidemia 01/31/2011   Other convulsions 01/31/2011   Other emphysema ([place]) 01/31/2011   Pain in joint, site unspecified 01/31/2011   Restless legs syndrome (RLS) 09/12/2011   Retinal detachment with retinal defect of right eye 2011   right eye twice  Seizure disorder ([place])    Senile osteoporosis 01/31/2011   Spontaneous ecchymoses 01/31/2011   Stiffness of joints, not elsewhere classified, multiple sites 01/31/2011   Unspecified essential hypertension 01/31/2011   Past Surgical History:  Procedure Laterality Date   ABDOMINAL HYSTERECTOMY  06/21/2003   TAH/BSO, omenectomy PSB resect, Stg IC cystadenofibroma   CHOLECYSTECTOMY  2005   Dr. Marlou Starks   ELBOW SURGERY Right 2008   broken   Dr. Apolonio Schneiders   EYE SURGERY     INTRAMEDULLARY (IM) NAIL INTERTROCHANTERIC Left 08/30/2021   Procedure: INTRAMEDULLARY (IM) NAIL INTERTROCHANTRIC;  Surgeon: Rod Can, MD;  Location: [facility];  Service: Orthopedics;   Laterality: Left;   ORIF PATELLA Left 05/02/2020   Procedure: OPEN REDUCTION INTERNAL (ORIF) FIXATION LEFT PATELLA WITH MEDIAL AND LATERAL LIGAMENT REINFORCEMENTS;  Surgeon: [name], MD;  Location: [facility];  Service: Orthopedics;  Laterality: Left;   ORIF PATELLA Left 05/30/2020   Procedure: OPEN REDUCTION INTERNAL (ORIF) FIXATION PATELLA;  Surgeon: [name], MD;  Location: [facility];  Service: Orthopedics;  Laterality: Left;   RETINAL DETACHMENT SURGERY N/A    two   REVERSE SHOULDER ARTHROPLASTY Left 05/06/2019   Procedure: REVERSE SHOULDER ARTHROPLASTY;  Surgeon: Justice Britain, MD;  Location: [facility];  Service: Orthopedics;  Laterality: Left;  180mn   ROTATOR CUFF REPAIR Right 2012   Dr. CTheda Sers  SQUAMOUS CELL CARCINOMA EXCISION Bilateral 2012, 8/14   [place] on legs   Dr. GSarajane Jews  TONSILLECTOMY  1941   VIDEO BRONCHOSCOPY WITH ENDOBRONCHIAL NAVIGATION N/A 11/29/2015   Procedure: VIDEO BRONCHOSCOPY WITH ENDOBRONCHIAL NAVIGATION;  Surgeon: RCollene Gobble MD;  [facility]: [facility]  Service: Thoracic;  Laterality: N/A;    Allergies  Allergen Reactions   Vioxx [Rofecoxib] Shortness Of Breath   Dyazide [Hydrochlorothiazide W-Triamterene] Other (See Comments)    Lowers blood pressure too much   Latex Swelling and Other (See Comments)    "Allergic," per [facility]   Sulfa Antibiotics Nausea And Vomiting and Other (See Comments)    [facility]  Sumycin [Tetracycline] Other (See Comments)    [facility] take due to drug reaction- [facility]   Allergies as of 12/04/2021       Reactions   Vioxx [rofecoxib] Shortness Of Breath   Dyazide [hydrochlorothiazide W-triamterene] Other (See Comments)   Lowers blood pressure too much   Latex Swelling, Other (See Comments)   "Allergic," per MAR   Sulfa Antibiotics Nausea And Vomiting, Other (See Comments)   [facility]  Sumycin [tetracycline] Other (See Comments)   Can[facility] take due to drug reaction- [facility]        Medication List        Accurate as of December 04, 2021 11:59 PM. If you have any questions, ask your nurse or doctor.          STOP taking these medications    acetaminophen 500 MG tablet Commonly known as: TYLENOL Stopped by: Eljay Lave X Lakeisha Waldrop, NP   divalproex 125 MG capsule Commonly known as: DEPAKOTE SPRINKLE Stopped by: Haylynn Pha X Anaissa Macfadden, NP   escitalopram 10 MG tablet Commonly known as: LEXAPRO Stopped by: Francesco Provencal X Mahli Glahn, NP   lamoTRIgine 150 MG tablet Commonly known as: LAMICTAL Stopped by: Darwyn Ponzo X Luka Reisch, NP   levETIRAcetam 250 MG tablet Commonly known as: KEPPRA Stopped by: Yevonne Yokum X Ibrohim Simmers, NP   levETIRAcetam 500 MG tablet Commonly known as: KEPPRA Stopped by: Lacoya Wilbanks X Deshay Kirstein, NP   memantine  10 MG tablet Commonly known as: NAMENDA Stopped by: Demar Shad X Huston Stonehocker, NP   OxyCODONE HCl (Abuse Deter) 5 MG Taba Commonly known as: OXAYDO Stopped by: Rana Hochstein X China Deitrick, NP   pantoprazole 40 MG tablet Commonly known as: PROTONIX Stopped by: Harris Kistler X Sydnei Ohaver, NP   risperiDONE 0.5 MG tablet Commonly known as: RISPERDAL Stopped by: Ayslin Kundert X Blayklee Mable, NP   sennosides-docusate sodium 8.6-50 MG tablet Commonly known as: SENOKOT-S Stopped by: Angles Trevizo X Kailyn Dubie, NP   simvastatin 10 MG tablet Commonly known as: ZOCOR Stopped by: Endya Austin X Marqueze Ramcharan, NP   tiotropium 18 MCG inhalation capsule Commonly known as: Spiriva HandiHaler Stopped by: Isaiah Cianci X Kainan Patty, NP   Vitamin D3 25 MCG (1000 UT) Caps Stopped by: Jakyren Fluegge X Larene Ascencio, NP       TAKE these medications    hydrocortisone cream 1 % Apply 1 application  topically 2 (two) times daily as needed for itching (to scaly areas of face and ears).   hydrocortisone 2.5 % lotion Apply 1 application  topically 2 (two) times daily as needed (to scaly areas of face and ears).   ketoconazole 2 % shampoo Commonly known as: NIZORAL Apply 1 application  topically See admin instructions. Shampoo on Mondays and Thursdays   ketoconazole 2 % cream Commonly known as: NIZORAL Apply 1 application   topically daily as needed (to affected areas for psoriasis).   LORazepam 1 MG tablet Commonly known as: ATIVAN Take 1 mg by mouth every 4 (four) hours as needed for anxiety. What changed: Another medication with the same name was removed. Continue taking this medication, and follow the directions you see here. Changed by: Donnette Macmullen X Safiya Girdler, NP   nystatin powder Commonly known as: MYCOSTATIN/NYSTOP Apply 1 application  topically 2 (two) times daily as needed (urogenital area).   pramipexole 0.5 MG tablet Commonly known as: MIRAPEX Take 0.5 mg by mouth at bedtime.   ProAir RespiClick 841 (90 Base) MCG/ACT Aepb Generic drug: Albuterol Sulfate Inhale 2 puffs into the lungs See admin instructions. Inhale 2 puffs into the lungs every morning as needed for shortness of breath or wheezing   triamcinolone cream 0.1 % Commonly known as: KENALOG Apply 1 application. topically daily as needed (psoriasis).        Review of Systems  Constitutional:  Positive for fatigue. Negative for appetite change and fever.  HENT:  Positive for hearing loss. Negative for trouble swallowing.   Eyes:  Negative for visual disturbance.  Respiratory:  Negative for cough.        DOE is chronic  Cardiovascular:  Negative for leg swelling.  Gastrointestinal:  Negative for abdominal pain and constipation.       Acid reflux symptoms.   Genitourinary:        SPC  Musculoskeletal:  Positive for arthralgias and gait problem.       S/p left hip ORIF, ORIF of the left patella.   Skin:  Positive for wound. Negative for color change.       BLE discoloration, mild erythema left knee.  Neurological:  Negative for seizures, speech difficulty, weakness and headaches.       Memory lapses. Hx of seizures. RLS  Psychiatric/Behavioral:  Positive for confusion and dysphoric mood. Negative for behavioral problems, hallucinations and sleep disturbance. The patient is not nervous/anxious.     Immunization History  Administered  Date(s) Administered   Influenza Split 01/06/2014, 01/15/2017, 01/08/2018, 12/09/2018   Influenza Whole 01/07/2012, 01/06/2013   Influenza, High Dose Seasonal PF 12/25/2015, 01/20/2017  Influenza,inj,Quad PF,6+ Mos 12/21/2014   Influenza-Unspecified 12/09/2018, 01/18/2020, 01/24/2021   Moderna SARS-COV2 Booster Vaccination 04/02/2021   Moderna Sars-Covid-2 Vaccination 04/12/2019, 06/05/2019, 02/15/2020, 09/05/2020   PFIZER(Purple Top)SARS-COV-2 Vaccination 12/26/2020   Pneumococcal Conjugate-13 02/01/2014   Pneumococcal Polysaccharide-23 12/20/1992, 01/15/2000, 04/08/2004, 06/08/2004   Td 04/08/2002, 04/22/2002   Tdap 04/09/2011, 08/17/2015   Zoster Recombinat (Shingrix) 05/29/2005, 08/27/2017   Zoster, Live 04/08/2008, 05/29/2014, 05/28/2017   Zoster, Unspecified 05/29/2005   Pertinent  Health Maintenance Due  Topic Date Due   INFLUENZA VACCINE  11/06/2021   DEXA SCAN  Completed   MAMMOGRAM  Discontinued      09/07/2021    9:30 PM 09/08/2021   12:40 PM 11/04/2021   10:37 AM 11/04/2021   11:24 PM 11/05/2021   10:59 AM  Fall Risk  Patient Fall Risk Level High fall risk High fall risk Moderate fall risk High fall risk High fall risk   Functional Status Survey:    Vitals:   12/04/21 1352  BP: 112/74  Pulse: 88  Resp: 16  Temp: (!) 96.9 F (36.1 C)  SpO2: 96%  Weight: 148 lb 14.4 oz (67.5 kg)  Height: '5\' 2"'$  (1.575 m)   Body mass index is 27.23 kg/m. Physical Exam Vitals and nursing note reviewed.  Constitutional:      Appearance: Normal appearance.  HENT:     Head: Normocephalic and atraumatic.     Nose: Nose normal.     Mouth/Throat:     Mouth: Mucous membranes are moist.  Eyes:     Extraocular Movements: Extraocular movements intact.     Conjunctiva/sclera: Conjunctivae normal.     Right eye: Right conjunctiva is not injected.     Left eye: Left conjunctiva is not injected.     Pupils: Pupils are equal, round, and reactive to light.  Neck:     Comments: Left  adam's apple bony aspect, sometimes feels soreness on palpation is chronic, no noted lymph nodes  Cardiovascular:     Rate and Rhythm: Normal rate and regular rhythm.     Heart sounds: Murmur heard.     Comments: PD pulses are not felt from previous examination.  Pulmonary:     Effort: Pulmonary effort is normal.     Breath sounds: Rales present.     Comments: Decreased air entry to both lungs. Bibasilar rales.  Abdominal:     General: Bowel sounds are normal.     Palpations: Abdomen is soft.     Tenderness: There is no abdominal tenderness.     Comments: Mid abd surgical scar  Musculoskeletal:     Cervical back: Normal range of motion and neck supple.     Right lower leg: No edema.     Left lower leg: No edema.     Comments: Decreased overhead ROM of the left shoulder. Left knee s/p ORIF of the patella, mild warmth has no change. S/p left hip ORIF 8 weeks out.   Skin:    General: Skin is warm and dry.     Comments: Brownish skin discoloration BLE. Mid back stage 2 pressure ulcer, no s/s of infection.   Neurological:     General: No focal deficit present.     Mental Status: She is alert. Mental status is at baseline.     Gait: Gait abnormal.  Psychiatric:     Comments: Smiled, conversed appropriately during my visit today.      Labs reviewed: Recent Labs    09/02/21 0359 09/04/21 1308 09/06/21 0449  11/02/21 0000 11/04/21 1047 11/13/21 0000  NA 138 144   < > 143 145 144  K 3.2* 3.9   < > 4.0 3.2* 3.7  CL 104 108   < > 109* 109 103  CO2 27 26   < > 24* 27 28*  GLUCOSE 98 102*  --   --  80  --   BUN 21 31*   < > 27* 16 28*  CREATININE 0.78 0.88   < > 0.8 0.69 0.7  CALCIUM 8.5* 8.8*   < > 9.5 9.6 9.3  MG 2.2  --   --   --   --   --    < > = values in this interval not displayed.   Recent Labs    10/12/21 0000 11/02/21 0000 11/04/21 1047  AST '18 19 20  '$ ALT '9 11 12  '$ ALKPHOS 224* 160* 157*  BILITOT  --   --  0.5  PROT  --   --  7.0  ALBUMIN 4.0 3.7 3.6   Recent  Labs    09/02/21 0359 09/04/21 1308 09/11/21 0000 10/12/21 0000 11/02/21 0000 11/04/21 1047  WBC 7.8 7.7   < > 4.7 4.3 4.7  NEUTROABS  --   --    < > 2,712.00 2,275.00 3.2  HGB 9.4* 10.0*   < > 12.7 11.6* 12.9  HCT 30.6* 32.5*   < > 40 36 41.6  MCV 85.2 86.4  --   --   --  83.7  PLT 190 239   < > 363 243 252   < > = values in this interval not displayed.   Lab Results  Component Value Date   TSH 1.89 05/22/2021   No results found for: "HGBA1C" Lab Results  Component Value Date   CHOL 147 09/27/2021   HDL 48 09/27/2021   LDLCALC 78 09/27/2021   TRIG 131 09/27/2021   CHOLHDL 3.6 06/27/2015    Significant Diagnostic Results in last 30 days:  No results found.  Assessment/Plan: Pressure ulcer, stage 2 ([place]) Assist the patient with frequent repositioning for pressure reduction, continue protective dressing.   Adult failure to thrive Adult failure to thrive, seems better in mood, prn Lorazepam used a few times, no Haldol or Morphine needed.   Suprapubic catheter [facility]) Urinary retention.   Major depressive disorder, recurrent (H[facility])  stable, no s/s of psychosis, prn Lorazepam is adequate   Senile dementia ([place]) under hospice service  Restless legs syndrome (RLS)  wants to resume MiraPex.   Seizure disorder ([place])  no active seizures, off meds  COPD (chronic obstructive pulmonary disease) (H[facility]) Stable, on ProAir    Family/ staff Communication: plan of care reviewed with the patient and charge nurse.   Labs/tests ordered:  none  Time spend 35 minutes.

## 2021-12-04 NOTE — Assessment & Plan Note (Signed)
Assist the patient with frequent repositioning for pressure reduction, continue protective dressing.

## 2021-12-04 NOTE — Assessment & Plan Note (Signed)
Stable, on ProAir 

## 2021-12-04 NOTE — Assessment & Plan Note (Signed)
under hospice service 

## 2021-12-04 NOTE — Assessment & Plan Note (Signed)
no active seizures, off meds.  

## 2021-12-04 NOTE — Assessment & Plan Note (Signed)
wants to resume MiraPex.

## 2021-12-04 NOTE — Assessment & Plan Note (Signed)
Urinary retention.

## 2021-12-04 NOTE — Assessment & Plan Note (Signed)
stable, no s/s of psychosis, prn Lorazepam is adequate  

## 2021-12-05 DIAGNOSIS — R338 Other retention of urine: Secondary | ICD-10-CM | POA: Diagnosis not present

## 2021-12-06 ENCOUNTER — Encounter: Payer: Self-pay | Admitting: Nurse Practitioner

## 2021-12-11 DIAGNOSIS — E785 Hyperlipidemia, unspecified: Secondary | ICD-10-CM | POA: Diagnosis not present

## 2021-12-11 DIAGNOSIS — E039 Hypothyroidism, unspecified: Secondary | ICD-10-CM | POA: Diagnosis not present

## 2021-12-11 DIAGNOSIS — I10 Essential (primary) hypertension: Secondary | ICD-10-CM | POA: Diagnosis not present

## 2021-12-11 LAB — COMPREHENSIVE METABOLIC PANEL
Albumin: 3 — AB (ref 3.5–5.0)
Calcium: 8.2 — AB (ref 8.7–10.7)
Globulin: 2.2

## 2021-12-11 LAB — HEPATIC FUNCTION PANEL
ALT: 8 U/L (ref 7–35)
AST: 12 — AB (ref 13–35)
Alkaline Phosphatase: 137 — AB (ref 25–125)
Bilirubin, Total: 0.6

## 2021-12-11 LAB — BASIC METABOLIC PANEL
BUN: 10 (ref 4–21)
CO2: 27 — AB (ref 13–22)
Chloride: 107 (ref 99–108)
Creatinine: 0.6 (ref 0.5–1.1)
Glucose: 68
Potassium: 3 mEq/L — AB (ref 3.5–5.1)
Sodium: 142 (ref 137–147)

## 2021-12-11 LAB — CBC AND DIFFERENTIAL
HCT: 37 (ref 36–46)
Hemoglobin: 11.7 — AB (ref 12.0–16.0)
Neutrophils Absolute: 2352
Platelets: 179 10*3/uL (ref 150–400)
WBC: 4

## 2021-12-11 LAB — CBC: RBC: 4.49 (ref 3.87–5.11)

## 2021-12-12 ENCOUNTER — Telehealth: Payer: Self-pay | Admitting: Adult Health

## 2021-12-12 NOTE — Telephone Encounter (Signed)
Nurse called to report that pt has a K of 3.0 last evening. No symptoms. She reports she is alert and oriented and able to swallow.Kdur 40 meq ordered x 1 dose.

## 2021-12-13 DIAGNOSIS — E876 Hypokalemia: Secondary | ICD-10-CM | POA: Diagnosis not present

## 2021-12-13 LAB — BASIC METABOLIC PANEL
BUN: 11 (ref 4–21)
CO2: 28 — AB (ref 13–22)
Chloride: 109 — AB (ref 99–108)
Creatinine: 0.6 (ref 0.5–1.1)
Glucose: 70
Potassium: 3.3 mEq/L — AB (ref 3.5–5.1)
Sodium: 144 (ref 137–147)

## 2021-12-13 LAB — COMPREHENSIVE METABOLIC PANEL
Calcium: 8.5 — AB (ref 8.7–10.7)
eGFR: 87

## 2021-12-14 ENCOUNTER — Encounter: Payer: Self-pay | Admitting: Nurse Practitioner

## 2021-12-14 ENCOUNTER — Non-Acute Institutional Stay (SKILLED_NURSING_FACILITY): Payer: Medicare PPO | Admitting: Nurse Practitioner

## 2021-12-14 DIAGNOSIS — E876 Hypokalemia: Secondary | ICD-10-CM | POA: Diagnosis not present

## 2021-12-14 DIAGNOSIS — Z9359 Other cystostomy status: Secondary | ICD-10-CM

## 2021-12-14 DIAGNOSIS — R609 Edema, unspecified: Secondary | ICD-10-CM

## 2021-12-14 DIAGNOSIS — F323 Major depressive disorder, single episode, severe with psychotic features: Secondary | ICD-10-CM

## 2021-12-14 DIAGNOSIS — L03116 Cellulitis of left lower limb: Secondary | ICD-10-CM | POA: Insufficient documentation

## 2021-12-14 DIAGNOSIS — L89102 Pressure ulcer of unspecified part of back, stage 2: Secondary | ICD-10-CM

## 2021-12-14 DIAGNOSIS — R627 Adult failure to thrive: Secondary | ICD-10-CM

## 2021-12-14 DIAGNOSIS — G2581 Restless legs syndrome: Secondary | ICD-10-CM

## 2021-12-14 DIAGNOSIS — F0390 Unspecified dementia without behavioral disturbance: Secondary | ICD-10-CM

## 2021-12-14 NOTE — Assessment & Plan Note (Signed)
supplemented, K 3.3 12/13/21, BMP one week.

## 2021-12-14 NOTE — Assessment & Plan Note (Signed)
under hospice service 

## 2021-12-14 NOTE — Assessment & Plan Note (Signed)
Adult failure to thrive, seems better in mood, prn Lorazepam used a few times, no Haldol or Morphine needed.

## 2021-12-14 NOTE — Assessment & Plan Note (Signed)
the left  lower leg redness, warmth, tenderness, swelling developed from previous lateral mid shin abrasion/hematoma.  Will start Keflex '500mg'$  q8hr po x 7 days, FloraStor bid x 7 days.

## 2021-12-14 NOTE — Assessment & Plan Note (Signed)
Managed with MiraPex

## 2021-12-14 NOTE — Assessment & Plan Note (Signed)
Mid back pressure area, no s/s of infection, covered with dry dressing. frequent repositioning to avoid pressure.

## 2021-12-14 NOTE — Assessment & Plan Note (Signed)
May void trial.

## 2021-12-14 NOTE — Assessment & Plan Note (Signed)
stable, no s/s of psychosis, prn Lorazepam is adequate  

## 2021-12-14 NOTE — Progress Notes (Unsigned)
Location:   SNF [place] Room Number: 10 Place of Service:  SNF (31) Provider: [facility] Myan Locatelli NP  [name], MD  Patient Care Team: [name], MD as PCP - General (Internal Medicine) [name], MD as Consulting Physician (Ophthalmology) [name], MD as Consulting Physician (Cardiology) [name], MD as Consulting Physician (Dermatology) [name], MD as Consulting Physician (Orthopedic Surgery) [name], MD as Consulting Physician (Orthopedic Surgery) [name], MD as Consulting Physician (Orthopedic Surgery) [place], [place], Tiyonna Sardinha X, NP as Nurse Practitioner (Nurse Practitioner) [name], MD (Inactive) as Consulting Physician (Neurology)  Extended Emergency Contact Information Primary Emergency Contact: [name],[name] Address: [address]          [place], [place] [address] [name] of [place] Phone: [phone] Relation: Sister Secondary Emergency Contact: [name] [place] of [place] Phone: [phone] Mobile Phone: [phone] Relation: Sister  Code Status: DNR Goals of care: Advanced Directive information    12/14/2021    3:13 PM  Advanced Directives  Does Patient Have a Medical Advance Directive? Yes  Type of Advance Directive Out of facility DNR (pink MOST or yellow form)  Does patient want to make changes to medical advance directive? No - Patient declined  Pre-existing out of facility DNR order (yellow form or pink MOST form) Pink MOST form placed in chart (order not valid for inpatient use);Yellow form placed in chart (order not valid for inpatient use)     Chief Complaint  Patient presents with  . Acute Visit    RLE swelling, redness, tenderness, warmth    HPI:  Pt is a 88 y.o. female seen today for an acute visit for the left  lower leg redness, warmth, tenderness, swelling developed from previous lateral mid shin abrasion/hematoma.    Hypokalemia, supplemented, K 3.3 12/13/21   Mid back pressure area, no s/s of infection, covered with dry dressing.   BLE edema, L>R since onset of skin abrasion developing into cellulitis, on Furosemide.              Adult failure to thrive, seems better in mood, prn Lorazepam used a few times, no Haldol or Morphine needed.              Suprapubic Catheter             Depression, stable, no s/s of psychosis, prn Lorazepam is adequate              Dementia, under hospice service             Restless leg syndrome, wants to resume MiraPex.              Hx of seizures, no active seizures, off meds.              COPD, on ProAir   Past Medical History:  Diagnosis Date  . Acute bronchitis 05/23/2011  . Acute upper respiratory infections of unspecified site 05/23/2011  . Arthritis   . Chronic airway obstruction, not elsewhere classified 05/23/2011  . Disturbance of salivary secretion 01/31/2011  . Dizziness and giddiness 01/31/2011  . Dyspnea   . Essential tremor 04/25/2014  . External hemorrhoids without mention of complication 81/44/8185  . Gait disorder 04/25/2014  . Insomnia, unspecified 09/12/2011  . Lumbago 01/31/2011  . Major depressive disorder, single episode, unspecified 01/31/2011  . Memory disorder 04/25/2014  . Mitral valve disorders(424.0) 01/31/2011  .  Other and unspecified hyperlipidemia 01/31/2011  . Other convulsions 01/31/2011  . Other emphysema ([place]) 01/31/2011  . Pain in joint, site unspecified 01/31/2011  . Restless legs syndrome (RLS) 09/12/2011  . Retinal detachment with retinal defect of right eye 2011   right eye twice  . Seizure disorder ([place])   . Senile osteoporosis 01/31/2011  . Spontaneous ecchymoses 01/31/2011  . Stiffness of joints, not elsewhere classified, multiple sites 01/31/2011  . Unspecified essential hypertension 01/31/2011   Past Surgical History:  Procedure Laterality Date  . ABDOMINAL HYSTERECTOMY  06/21/2003   TAH/BSO, omenectomy PSB resect, Stg  IC cystadenofibroma  . CHOLECYSTECTOMY  2005   Dr. Marlou Starks  . ELBOW SURGERY Right 2008   broken   Dr. Apolonio Schneiders  . EYE SURGERY    . INTRAMEDULLARY (IM) NAIL INTERTROCHANTERIC Left 08/30/2021   Procedure: INTRAMEDULLARY (IM) NAIL INTERTROCHANTRIC;  Surgeon: Rod Can, MD;  Location: [facility];  Service: Orthopedics;  Laterality: Left;  . ORIF PATELLA Left 05/02/2020   Procedure: OPEN REDUCTION INTERNAL (ORIF) FIXATION LEFT PATELLA WITH MEDIAL AND LATERAL LIGAMENT REINFORCEMENTS;  Surgeon: [name], MD;  Location: [facility];  Service: Orthopedics;  Laterality: Left;  . ORIF PATELLA Left 05/30/2020   Procedure: OPEN REDUCTION INTERNAL (ORIF) FIXATION PATELLA;  Surgeon: [name], MD;  Location: [facility];  Service: Orthopedics;  Laterality: Left;  . RETINAL DETACHMENT SURGERY N/A    two  . REVERSE SHOULDER ARTHROPLASTY Left 05/06/2019   Procedure: REVERSE SHOULDER ARTHROPLASTY;  Surgeon: Justice Britain, MD;  Location: [facility];  Service: Orthopedics;  Laterality: Left;  184mn  . ROTATOR CUFF REPAIR Right 2012   Dr. CTheda Sers . SQUAMOUS CELL CARCINOMA EXCISION Bilateral 2012, 8/14   [place] on legs   Dr. GSarajane Jews . TONSILLECTOMY  1941  . VIDEO BRONCHOSCOPY WITH ENDOBRONCHIAL NAVIGATION N/A 11/29/2015   Procedure: VIDEO BRONCHOSCOPY WITH ENDOBRONCHIAL NAVIGATION;  Surgeon: RCollene Gobble MD;  Location: [facility];  Service: Thoracic;  Laterality: N/A;    Allergies  Allergen Reactions  . Vioxx [Rofecoxib] Shortness Of Breath  . Dyazide [Hydrochlorothiazide W-Triamterene] Other (See Comments)    Lowers blood pressure too much  . Latex Swelling and Other (See Comments)    "Allergic," per MAR  . Sulfa Antibiotics Nausea And Vomiting and Other (See Comments)    "Allergic," per MAR  . Sumycin [Tetracycline] Other (See Comments)    [facility] take due to drug reaction- [facility]   Allergies as of 12/14/2021       Reactions   Vioxx [rofecoxib] Shortness Of Breath   Dyazide  [hydrochlorothiazide W-triamterene] Other (See Comments)   Lowers blood pressure too much   Latex Swelling, Other (See Comments)   "Allergic," per MAR   Sulfa Antibiotics Nausea And Vomiting, Other (See Comments)   [facility]  Sumycin [tetracycline] Other (See Comments)   Can[facility] take due to drug reaction- [facility]       Medication List        Accurate as of December 14, 2021 11:59 PM. If you have any questions, ask your nurse or doctor.          hydrocortisone cream 1 % Apply 1 application  topically 2 (two) times daily as needed for itching (to scaly areas of face and ears).   hydrocortisone 2.5 % lotion Apply 1 application  topically 2 (two) times daily as needed (to scaly areas of face and ears).   ketoconazole 2 % shampoo Commonly known  as: NIZORAL Apply 1 application  topically See admin instructions. Shampoo on Mondays and Thursdays   ketoconazole 2 % cream Commonly known as: NIZORAL Apply 1 application  topically daily as needed (to affected areas for psoriasis).   LORazepam 1 MG tablet Commonly known as: ATIVAN Take 1 mg by mouth every 4 (four) hours as needed for anxiety.   nystatin powder Commonly known as: MYCOSTATIN/NYSTOP Apply 1 application  topically 2 (two) times daily as needed (urogenital area).   potassium chloride SA 20 MEQ tablet Commonly known as: KLOR-CON M Take 20 mEq by mouth daily.   pramipexole 0.5 MG tablet Commonly known as: MIRAPEX Take 0.5 mg by mouth at bedtime.   ProAir RespiClick 694 (90 Base) MCG/ACT Aepb Generic drug: Albuterol Sulfate Inhale 2 puffs into the lungs See admin instructions. Inhale 2 puffs into the lungs every morning as needed for shortness of breath or wheezing   triamcinolone cream 0.1 % Commonly known as: KENALOG Apply 1 application. topically daily as needed (psoriasis).        Review of Systems  Constitutional:  Negative for appetite change, fatigue and fever.  HENT:   Positive for hearing loss. Negative for trouble swallowing.   Eyes:  Negative for visual disturbance.  Respiratory:  Negative for cough.        DOE is chronic  Cardiovascular:  Positive for leg swelling.  Gastrointestinal:  Negative for abdominal pain and constipation.       Acid reflux symptoms.   Genitourinary:        SPC  Musculoskeletal:  Positive for arthralgias and gait problem.       S/p left hip ORIF, ORIF of the left patella.   Skin:  Positive for wound. Negative for color change.       BLE discoloration, chronic venous insufficiency skin changes, lateral left shin hematoma, LLE lower 1/3 redness, warmth, tenderness, swelling, but no cord feel findings in the left calf.   Neurological:  Negative for seizures, speech difficulty, weakness and headaches.       Memory lapses. Hx of seizures. RLS  Psychiatric/Behavioral:  Negative for behavioral problems, dysphoric mood and sleep disturbance. The patient is not nervous/anxious.     Immunization History  Administered Date(s) Administered  . Influenza Split 01/06/2014, 01/15/2017, 01/08/2018, 12/09/2018  . Influenza Whole 01/07/2012, 01/06/2013  . Influenza, High Dose Seasonal PF 12/25/2015, 01/20/2017  . Influenza,inj,Quad PF,6+ Mos 12/21/2014  . Influenza-Unspecified 12/09/2018, 01/18/2020, 01/24/2021  . Moderna SARS-COV2 Booster Vaccination 04/02/2021  . Moderna Sars-Covid-2 Vaccination 04/12/2019, 06/05/2019, 02/15/2020, 09/05/2020  . PFIZER(Purple Top)SARS-COV-2 Vaccination 12/26/2020  . Pneumococcal Conjugate-13 02/01/2014  . Pneumococcal Polysaccharide-23 12/20/1992, 01/15/2000, 04/08/2004, 06/08/2004  . Td 04/08/2002, 04/22/2002  . Tdap 04/09/2011, 08/17/2015  . Zoster Recombinat (Shingrix) 05/29/2005, 08/27/2017  . Zoster, Live 04/08/2008, 05/29/2014, 05/28/2017  . Zoster, Unspecified 05/29/2005   Pertinent  Health Maintenance Due  Topic Date Due  . INFLUENZA VACCINE  11/06/2021  . DEXA SCAN  Completed  . MAMMOGRAM   Discontinued      09/07/2021    9:30 PM 09/08/2021   12:40 PM 11/04/2021   10:37 AM 11/04/2021   11:24 PM 11/05/2021   10:59 AM  Fall Risk  Patient Fall Risk Level High fall risk High fall risk Moderate fall risk High fall risk High fall risk   Functional Status Survey:    Vitals:   12/14/21 1507  BP: 128/68  Pulse: (!) 104  Resp: 18  Temp: (!) 97.5 F (36.4 C)  SpO2: 95%  Weight: 142 lb 11.2 oz (64.7 kg)  Height: '5\' 2"'$  (1.575 m)   Body mass index is 26.1 kg/m. Physical Exam Vitals and nursing note reviewed.  Constitutional:      Appearance: Normal appearance.  HENT:     Head: Normocephalic and atraumatic.     Nose: Nose normal.     Mouth/Throat:     Mouth: Mucous membranes are moist.  Eyes:     Extraocular Movements: Extraocular movements intact.     Conjunctiva/sclera: Conjunctivae normal.     Right eye: Right conjunctiva is not injected.     Left eye: Left conjunctiva is not injected.     Pupils: Pupils are equal, round, and reactive to light.  Neck:     Comments: Left adam's apple bony aspect, sometimes feels soreness on palpation is chronic, no noted lymph nodes  Cardiovascular:     Rate and Rhythm: Normal rate and regular rhythm.     Heart sounds: Murmur heard.     Comments: PD pulses are not felt from previous examination.  Pulmonary:     Effort: Pulmonary effort is normal.     Breath sounds: Rales present.     Comments: Decreased air entry to both lungs. Bibasilar rales.  Abdominal:     General: Bowel sounds are normal.     Palpations: Abdomen is soft.     Tenderness: There is no abdominal tenderness.     Comments: Mid abd surgical scar  Musculoskeletal:     Cervical back: Normal range of motion and neck supple.     Right lower leg: Edema present.     Left lower leg: Edema present.     Comments: Decreased overhead ROM of the left shoulder. Left knee s/p ORIF of the patella fx. S/p left hip ORIF LLE 2+, RLE 1+  Skin:    General: Skin is warm and dry.      Comments: Brownish skin discoloration BLE. Mid back stage 2 pressure ulcer, no s/s of infection. BLE discoloration, chronic venous insufficiency skin changes, lateral left shin hematoma, LLE lower 1/3 redness, warmth, tenderness, swelling, but no cord feel findings in the left calf.   Neurological:     General: No focal deficit present.     Mental Status: She is alert. Mental status is at baseline.     Gait: Gait abnormal.  Psychiatric:     Comments: Smiled, conversed appropriately during my visit today.     Labs reviewed: Recent Labs    09/02/21 0359 09/04/21 1308 09/06/21 0449 11/04/21 1047 11/13/21 0000 12/11/21 0000  NA 138 144   < > 145 144 142  K 3.2* 3.9   < > 3.2* 3.7 3.0*  CL 104 108   < > 109 103 107  CO2 27 26   < > 27 28* 27*  GLUCOSE 98 102*  --  80  --   --   BUN 21 31*   < > 16 28* 10  CREATININE 0.78 0.88   < > 0.69 0.7 0.6  CALCIUM 8.5* 8.8*   < > 9.6 9.3 8.2*  MG 2.2  --   --   --   --   --    < > = values in this interval not displayed.   Recent Labs    11/02/21 0000 11/04/21 1047 12/11/21 0000  AST 19 20 12*  ALT '11 12 8  '$ ALKPHOS 160* 157* 137*  BILITOT  --  0.5  --   PROT  --  7.0  --   ALBUMIN 3.7 3.6 3.0*   Recent Labs    09/02/21 0359 09/04/21 1308 09/11/21 0000 11/02/21 0000 11/04/21 1047 12/11/21 0000  WBC 7.8 7.7   < > 4.3 4.7 4.0  NEUTROABS  --   --    < > 2,275.00 3.2 2,352.00  HGB 9.4* 10.0*   < > 11.6* 12.9 11.7*  HCT 30.6* 32.5*   < > 36 41.6 37  MCV 85.2 86.4  --   --  83.7  --   PLT 190 239   < > 243 252 179   < > = values in this interval not displayed.   Lab Results  Component Value Date   TSH 1.89 05/22/2021   No results found for: "HGBA1C" Lab Results  Component Value Date   CHOL 147 09/27/2021   HDL 48 09/27/2021   LDLCALC 78 09/27/2021   TRIG 131 09/27/2021   CHOLHDL 3.6 06/27/2015    Significant Diagnostic Results in last 30 days:  No results found.  Assessment/Plan: Cellulitis of left leg without  foot the left  lower leg redness, warmth, tenderness, swelling developed from previous lateral mid shin abrasion/hematoma.  Will start Keflex '500mg'$  q8hr po x 7 days, FloraStor bid x 7 days.   Hypokalemia supplemented, K 3.3 12/13/21, BMP one week.   Edema RLE 1+, LLE 2+, will Furosemide '20mg'$  M+Thr, observe.   Pressure ulcer, stage 2 (HCC) Mid back pressure area, no s/s of infection, covered with dry dressing. frequent repositioning to avoid pressure.   Adult failure to thrive   Adult failure to thrive, seems better in mood, prn Lorazepam used a few times, no Haldol or Morphine needed.   Suprapubic catheter ([place]) May void trial.   Depression, psychotic ([place]) stable, no s/s of psychosis, prn Lorazepam is adequate   Restless legs syndrome (RLS) Managed with MiraPex  Senile dementia ([place]) under hospice service    Family/ staff Communication: plan of care reviewed with the patient and charge nurse.   Labs/tests ordered:  none  Time spend 35 minutes.

## 2021-12-14 NOTE — Assessment & Plan Note (Signed)
RLE 1+, LLE 2+, will Furosemide '20mg'$  M+Thr, observe.

## 2021-12-17 ENCOUNTER — Encounter: Payer: Self-pay | Admitting: Nurse Practitioner

## 2021-12-18 ENCOUNTER — Non-Acute Institutional Stay (SKILLED_NURSING_FACILITY): Admitting: Family Medicine

## 2021-12-18 ENCOUNTER — Encounter: Payer: Self-pay | Admitting: Family Medicine

## 2021-12-18 DIAGNOSIS — I10 Essential (primary) hypertension: Secondary | ICD-10-CM | POA: Diagnosis not present

## 2021-12-18 DIAGNOSIS — L03116 Cellulitis of left lower limb: Secondary | ICD-10-CM | POA: Diagnosis not present

## 2021-12-18 DIAGNOSIS — Z87898 Personal history of other specified conditions: Secondary | ICD-10-CM | POA: Diagnosis not present

## 2021-12-18 DIAGNOSIS — F323 Major depressive disorder, single episode, severe with psychotic features: Secondary | ICD-10-CM

## 2021-12-18 DIAGNOSIS — R4689 Other symptoms and signs involving appearance and behavior: Secondary | ICD-10-CM

## 2021-12-18 DIAGNOSIS — R4189 Other symptoms and signs involving cognitive functions and awareness: Secondary | ICD-10-CM

## 2021-12-18 DIAGNOSIS — R627 Adult failure to thrive: Secondary | ICD-10-CM

## 2021-12-18 NOTE — Progress Notes (Signed)
Location:  [facility] Room Number: 29-A Place of Service:  SNF [phone]) Provider:  Lillette Boxer.Rinaldo Cloud, MD  Patient Care Team: [name], MD as PCP - General (Internal Medicine) [name], MD as Consulting Physician (Ophthalmology) [name], MD as Consulting Physician (Cardiology) [name], MD as Consulting Physician (Dermatology) [name], MD as Consulting Physician (Orthopedic Surgery) [name], MD as Consulting Physician (Orthopedic Surgery) [name], MD as Consulting Physician (Orthopedic Surgery) Sibley, Alpha, Man X, NP as Nurse Practitioner (Nurse Practitioner) [name], MD (Inactive) as Consulting Physician (Neurology)  Extended Emergency Contact Information Primary Emergency Contact: [name],[name] Address: [address]          [place], [place] [address] [name] of [place] Phone: [phone] Relation: Sister Secondary Emergency Contact: [name] [place] of [place] Phone: [phone] Mobile Phone: [phone] Relation: Sister  Code Status:  DNR Goals of care: Advanced Directive information    12/18/2021   10:46 AM  Advanced Directives  Does Patient Have a Medical Advance Directive? Yes  Type of Advance Directive Out of facility DNR (pink MOST or yellow form)  Does patient want to make changes to medical advance directive? No - Patient declined  Pre-existing out of facility DNR order (yellow form or pink MOST form) Pink MOST/Yellow Form most recent copy in chart - Physician notified to receive inpatient order     Chief Complaint  Patient presents with   Routine    HPI:  Pt is a 88 y.o. female seen today for medical management of chronic diseases.  Chronic diseases include cognitive decline, restless leg syndrome, seizure disorder, COPD, and urinary retention treated with suprapubic catheter. I had a very nice  visit with patient today.  She is perfectly coherent shows no signs or symptoms of paranoia.  Not sure what happened when she was having those symptoms but no longer is having hallucinations or paranoia.  Medications include as needed lorazepam, Mirapex for restless legs.  She is off all seizure medicines and had no seizures.  She is not using her inhaler for COPD. She is anxious that she may fall back into those behaviors that are poorly understood.  I have asked her to rely on family and friends to let her know if they are seeing some change in her, behaviorally. She was seen recently for cellulitis on her left leg.  She has small skin tear and surrounding erythema.  She was placed on Keflex.  There is no worsening but no improvement either at this point.   Past Medical History:  Diagnosis Date   Acute bronchitis 05/23/2011   Acute upper respiratory infections of unspecified site 05/23/2011   Arthritis    Chronic airway obstruction, not elsewhere classified 05/23/2011   Disturbance of salivary secretion 01/31/2011   Dizziness and giddiness 01/31/2011   Dyspnea    Essential tremor 04/25/2014   External hemorrhoids without mention of complication 80/99/8338   Gait disorder 04/25/2014   Insomnia, unspecified 09/12/2011   Lumbago 01/31/2011   Major depressive disorder, single episode, unspecified 01/31/2011   Memory disorder 04/25/2014   Mitral valve disorders(424.0) 01/31/2011   Other and unspecified hyperlipidemia 01/31/2011   Other convulsions 01/31/2011   Other emphysema ([place]) 01/31/2011   Pain in joint, site unspecified 01/31/2011   Restless legs syndrome (RLS) 09/12/2011   Retinal detachment with retinal defect of right eye 2011  right eye twice   Seizure disorder ([place])    Senile osteoporosis 01/31/2011   Spontaneous ecchymoses 01/31/2011   Stiffness of joints, not elsewhere classified, multiple sites 01/31/2011   Unspecified essential hypertension 01/31/2011   Past Surgical History:   Procedure Laterality Date   ABDOMINAL HYSTERECTOMY  06/21/2003   TAH/BSO, omenectomy PSB resect, Stg IC cystadenofibroma   CHOLECYSTECTOMY  2005   Dr. Marlou Starks   ELBOW SURGERY Right 2008   broken   Dr. Apolonio Schneiders   EYE SURGERY     INTRAMEDULLARY (IM) NAIL INTERTROCHANTERIC Left 08/30/2021   Procedure: INTRAMEDULLARY (IM) NAIL INTERTROCHANTRIC;  Surgeon: Rod Can, MD;  Location: [facility];  Service: Orthopedics;  Laterality: Left;   ORIF PATELLA Left 05/02/2020   Procedure: OPEN REDUCTION INTERNAL (ORIF) FIXATION LEFT PATELLA WITH MEDIAL AND LATERAL LIGAMENT REINFORCEMENTS;  Surgeon: [name], MD;  Location: [facility];  Service: Orthopedics;  Laterality: Left;   ORIF PATELLA Left 05/30/2020   Procedure: OPEN REDUCTION INTERNAL (ORIF) FIXATION PATELLA;  Surgeon: [name], MD;  Location: [facility];  Service: Orthopedics;  Laterality: Left;   RETINAL DETACHMENT SURGERY N/A    two   REVERSE SHOULDER ARTHROPLASTY Left 05/06/2019   Procedure: REVERSE SHOULDER ARTHROPLASTY;  Surgeon: Justice Britain, MD;  Location: [facility];  Service: Orthopedics;  Laterality: Left;  188mn   ROTATOR CUFF REPAIR Right 2012   Dr. CTheda Sers  SQUAMOUS CELL CARCINOMA EXCISION Bilateral 2012, 8/14   [place] on legs   Dr. GSarajane Jews  TONSILLECTOMY  1941   VIDEO BRONCHOSCOPY WITH ENDOBRONCHIAL NAVIGATION N/A 11/29/2015   Procedure: VIDEO BRONCHOSCOPY WITH ENDOBRONCHIAL NAVIGATION;  Surgeon: RCollene Gobble MD;  [facility]  Service: Thoracic;  Laterality: N/A;    Allergies  Allergen Reactions   Vioxx [Rofecoxib] Shortness Of Breath   Dyazide [Hydrochlorothiazide W-Triamterene] Other (See Comments)    Lowers blood pressure too much   Latex Swelling and Other (See Comments)    "Allergic," per [facility]   Sulfa Antibiotics Nausea And Vomiting and Other (See Comments)    [facility]  Sumycin [Tetracycline] Other (See Comments)    Can't take due to drug reaction- [facility]   Outpatient Encounter  Medications as of 12/18/2021  Medication Sig   Albuterol Sulfate (PROAIR RESPICLICK) 1182(90 Base) MCG/ACT AEPB Inhale 2 puffs into the lungs See admin instructions. Inhale 2 puffs into the lungs every morning as needed for shortness of breath or wheezing   cephALEXin (KEFLEX) 500 MG capsule Take 500 mg by mouth every 8 (eight) hours. Cellulitis of left lower limb   furosemide (LASIX) 20 MG tablet Take 20 mg by mouth. Once A Day on Mon, Thu   hydrocortisone 2.5 % lotion Apply 1 application  topically 2 (two) times daily as needed (to scaly areas of face and ears).   hydrocortisone cream 1 % Apply 1 application  topically 2 (two) times daily as needed for itching (to scaly areas of face and ears).   ketoconazole (NIZORAL) 2 % cream Apply 1 application  topically daily as needed (to affected areas for psoriasis).   ketoconazole (NIZORAL) 2 % shampoo Apply 1 application  topically See admin instructions. Shampoo on Mondays and Thursdays   LORazepam (ATIVAN) 1 MG tablet Take 1 mg by mouth every 4 (four) hours as needed for anxiety.   nystatin (MYCOSTATIN/NYSTOP) powder Apply 1 application  topically 2 (two) times daily as needed (urogenital area).   potassium chloride SA (KLOR-CON M) 20 MEQ  tablet Take 20 mEq by mouth daily.   pramipexole (MIRAPEX) 0.5 MG tablet Take 0.5 mg by mouth at bedtime.   saccharomyces boulardii (FLORASTOR) 250 MG capsule Take 250 mg by mouth 2 (two) times daily. Encounter for prophylactic measures, unspecified   triamcinolone (KENALOG) 0.1 % Apply 1 application. topically daily as needed (psoriasis).   No facility-administered encounter medications on file as of 12/18/2021.    Review of Systems  Constitutional: Negative.   HENT: Negative.    Cardiovascular: Negative.   Genitourinary:        Has suprapubic cath  Neurological:  Positive for seizures.  Hematological:  Bruises/bleeds easily.  Psychiatric/Behavioral:  Positive for behavioral problems and hallucinations.         Behaviors and hallucinations are historic but not current  All other systems reviewed and are negative.   Immunization History  Administered Date(s) Administered   Influenza Split 01/06/2014, 01/15/2017, 01/08/2018, 12/09/2018   Influenza Whole 01/07/2012, 01/06/2013   Influenza, High Dose Seasonal PF 12/25/2015, 01/20/2017   Influenza,inj,Quad PF,6+ Mos 12/21/2014   Influenza-Unspecified 12/09/2018, 01/18/2020, 01/24/2021   Moderna SARS-COV2 Booster Vaccination 04/02/2021   Moderna Sars-Covid-2 Vaccination 04/12/2019, 06/05/2019, 02/15/2020, 09/05/2020   PFIZER(Purple Top)SARS-COV-2 Vaccination 12/26/2020   Pneumococcal Conjugate-13 02/01/2014   Pneumococcal Polysaccharide-23 12/20/1992, 01/15/2000, 04/08/2004, 06/08/2004   Td 04/08/2002, 04/22/2002   Tdap 04/09/2011, 08/17/2015   Zoster Recombinat (Shingrix) 05/29/2005, 08/27/2017   Zoster, Live 04/08/2008, 05/29/2014, 05/28/2017   Zoster, Unspecified 05/29/2005   Pertinent  Health Maintenance Due  Topic Date Due   INFLUENZA VACCINE  11/06/2021   DEXA SCAN  Completed   MAMMOGRAM  Discontinued      09/07/2021    9:30 PM 09/08/2021   12:40 PM 11/04/2021   10:37 AM 11/04/2021   11:24 PM 11/05/2021   10:59 AM  Fall Risk  Patient Fall Risk Level High fall risk High fall risk Moderate fall risk High fall risk High fall risk   Functional Status Survey:    Vitals:   12/18/21 1044  BP: 114/72  Pulse: 91  Resp: 18  Temp: 97.8 F (36.6 C)  SpO2: 94%  Weight: 142 lb 11.2 oz (64.7 kg)  Height: '5\' 2"'$  (1.575 m)   Body mass index is 26.1 kg/m. Physical Exam Vitals and nursing note reviewed.  Constitutional:      Appearance: Normal appearance.  HENT:     Head: Normocephalic.     Mouth/Throat:     Mouth: Mucous membranes are moist.  Eyes:     Pupils: Pupils are equal, round, and reactive to light.  Cardiovascular:     Rate and Rhythm: Normal rate and regular rhythm.     Heart sounds: Normal heart sounds.  Pulmonary:      Effort: Pulmonary effort is normal.     Breath sounds: Normal breath sounds.  Abdominal:     General: Abdomen is flat. Bowel sounds are normal.  Musculoskeletal:     Cervical back: Normal range of motion.  Skin:    Comments: Erythema on left lower leg consistent with cellulitis  Neurological:     General: No focal deficit present.     Mental Status: She is alert and oriented to person, place, and time.  Psychiatric:        Mood and Affect: Mood normal.        Behavior: Behavior normal.        Thought Content: Thought content normal.     Labs reviewed: Recent Labs    09/02/21 0359 09/04/21  1308 09/06/21 0449 11/04/21 1047 11/13/21 0000 12/11/21 0000 12/13/21 0000  NA 138 144   < > 145 144 142 144  K 3.2* 3.9   < > 3.2* 3.7 3.0* 3.3*  CL 104 108   < > 109 103 107 109*  CO2 27 26   < > 27 28* 27* 28*  GLUCOSE 98 102*  --  80  --   --   --   BUN 21 31*   < > 16 28* 10 11  CREATININE 0.78 0.88   < > 0.69 0.7 0.6 0.6  CALCIUM 8.5* 8.8*   < > 9.6 9.3 8.2* 8.5*  MG 2.2  --   --   --   --   --   --    < > = values in this interval not displayed.   Recent Labs    11/02/21 0000 11/04/21 1047 12/11/21 0000  AST 19 20 12*  ALT '11 12 8  '$ ALKPHOS 160* 157* 137*  BILITOT  --  0.5  --   PROT  --  7.0  --   ALBUMIN 3.7 3.6 3.0*   Recent Labs    09/02/21 0359 09/04/21 1308 09/11/21 0000 11/02/21 0000 11/04/21 1047 12/11/21 0000  WBC 7.8 7.7   < > 4.3 4.7 4.0  NEUTROABS  --   --    < > 2,275.00 3.2 2,352.00  HGB 9.4* 10.0*   < > 11.6* 12.9 11.7*  HCT 30.6* 32.5*   < > 36 41.6 37  MCV 85.2 86.4  --   --  83.7  --   PLT 190 239   < > 243 252 179   < > = values in this interval not displayed.   Lab Results  Component Value Date   TSH 1.89 05/22/2021   No results found for: "HGBA1C" Lab Results  Component Value Date   CHOL 147 09/27/2021   HDL 48 09/27/2021   LDLCALC 78 09/27/2021   TRIG 131 09/27/2021   CHOLHDL 3.6 06/27/2015    Significant Diagnostic Results  in last 30 days:  No results found.  Assessment/Plan 1. Adult failure to thrive Patient appears to be doing well.  Weights are stable.  Appetite reported good  2. Cellulitis of left leg without foot Taking Keflex 500 mg 3 times daily.  This has just been started.  I would continue for at least 3 to 4 days before changing treatment  3. Cognitive and behavioral changes There is no evidence on my time spent with patient of any significant behaviors or cognitive changes  4. Essential hypertension Blood pressure 114/72.  On no medication  5. Depression, psychotic [facility]) Doing well for now.  Off medication  6. History of urinary retention Treated with suprapubic cath and followed by urology  Family/ staff Communication:   Labs/tests ordered:

## 2022-01-01 ENCOUNTER — Non-Acute Institutional Stay (SKILLED_NURSING_FACILITY): Payer: Medicare PPO | Admitting: Nurse Practitioner

## 2022-01-01 DIAGNOSIS — E876 Hypokalemia: Secondary | ICD-10-CM | POA: Diagnosis not present

## 2022-01-01 DIAGNOSIS — Z9359 Other cystostomy status: Secondary | ICD-10-CM

## 2022-01-01 DIAGNOSIS — J431 Panlobular emphysema: Secondary | ICD-10-CM

## 2022-01-01 DIAGNOSIS — R609 Edema, unspecified: Secondary | ICD-10-CM

## 2022-01-01 DIAGNOSIS — F331 Major depressive disorder, recurrent, moderate: Secondary | ICD-10-CM

## 2022-01-01 DIAGNOSIS — L0390 Cellulitis, unspecified: Secondary | ICD-10-CM | POA: Diagnosis not present

## 2022-01-01 DIAGNOSIS — R627 Adult failure to thrive: Secondary | ICD-10-CM

## 2022-01-01 DIAGNOSIS — G2581 Restless legs syndrome: Secondary | ICD-10-CM

## 2022-01-01 NOTE — Assessment & Plan Note (Signed)
on Furosemide.

## 2022-01-01 NOTE — Progress Notes (Signed)
Location:   SNF [place] Room Number: 81 Place of Service:  SNF (31) Provider: [facility] Britain Anagnos NP  [name], MD  Patient Care Team: [name], MD as PCP - General (Internal Medicine) [name], MD as Consulting Physician (Ophthalmology) [name], MD as Consulting Physician (Cardiology) [name], MD as Consulting Physician (Dermatology) [name], MD as Consulting Physician (Orthopedic Surgery) [name], MD as Consulting Physician (Orthopedic Surgery) [name], MD as Consulting Physician (Orthopedic Surgery) [place], [place], Donyell Ding X, NP as Nurse Practitioner (Nurse Practitioner) [name], MD (Inactive) as Consulting Physician (Neurology)  Extended Emergency Contact Information Primary Emergency Contact: [name],[name] Address: [address]          [place], [place] [address] [name] of [place] Phone: [phone] Relation: Sister Secondary Emergency Contact: [name] [place] of [place] Phone: [phone] Mobile Phone: [phone] Relation: Sister  Code Status: DNR Goals of care: Advanced Directive information    12/18/2021   10:46 AM  Advanced Directives  Does Patient Have a Medical Advance Directive? Yes  Type of Advance Directive Out of facility DNR (pink MOST or yellow form)  Does patient want to make changes to medical advance directive? No - Patient declined  Pre-existing out of facility DNR order (yellow form or pink MOST form) Pink MOST/Yellow Form most recent copy in chart - Physician notified to receive inpatient order     Chief Complaint  Patient presents with   Acute Visit    Top of left knee red, warmth area.     HPI:  Pt is a 88 y.o. female seen today for an acute visit for a quarter sized red, warmth, raised, fluid filled area on the previous left knee surgical incision. No change of ROM of the left or ballottement or pain of the left  knee noted.     previous lateral mid shin abrasion/hematoma, slow healing.              Hypokalemia, supplemented, K 3.7 12/20/21              Mid back pressure area, no s/s of infection, covered with dry dressing.              BLE edema,trace, on Furosemide.              Adult failure to thrive, seems better in mood, prn Lorazepam used a few times, no Haldol or Morphine needed.              Suprapubic Catheter             Depression, stable, no s/s of psychosis, prn Lorazepam is adequate              Dementia, under hospice service             Restless leg syndrome, stable, taking MiraPex.              Hx of seizures, no active seizures, off meds.              COPD, on ProAir       Past Medical History:  Diagnosis Date   Acute bronchitis 05/23/2011   Acute upper respiratory infections of unspecified site 05/23/2011   Arthritis    Chronic airway obstruction, not elsewhere classified 05/23/2011   Disturbance of salivary secretion 01/31/2011   Dizziness and giddiness 01/31/2011   Dyspnea  Essential tremor 04/25/2014   External hemorrhoids without mention of complication 94/85/4627   Gait disorder 04/25/2014   Insomnia, unspecified 09/12/2011   Lumbago 01/31/2011   Major depressive disorder, single episode, unspecified 01/31/2011   Memory disorder 04/25/2014   Mitral valve disorders(424.0) 01/31/2011   Other and unspecified hyperlipidemia 01/31/2011   Other convulsions 01/31/2011   Other emphysema ([place]) 01/31/2011   Pain in joint, site unspecified 01/31/2011   Restless legs syndrome (RLS) 09/12/2011   Retinal detachment with retinal defect of right eye 2011   right eye twice   Seizure disorder ([place])    Senile osteoporosis 01/31/2011   Spontaneous ecchymoses 01/31/2011   Stiffness of joints, not elsewhere classified, multiple sites 01/31/2011   Unspecified essential hypertension 01/31/2011   Past Surgical History:  Procedure Laterality Date   ABDOMINAL HYSTERECTOMY  06/21/2003    TAH/BSO, omenectomy PSB resect, Stg IC cystadenofibroma   CHOLECYSTECTOMY  2005   Dr. Marlou Starks   ELBOW SURGERY Right 2008   broken   Dr. Apolonio Schneiders   EYE SURGERY     INTRAMEDULLARY (IM) NAIL INTERTROCHANTERIC Left 08/30/2021   Procedure: INTRAMEDULLARY (IM) NAIL INTERTROCHANTRIC;  Surgeon: Rod Can, MD;  Location: [facility];  Service: Orthopedics;  Laterality: Left;   ORIF PATELLA Left 05/02/2020   Procedure: OPEN REDUCTION INTERNAL (ORIF) FIXATION LEFT PATELLA WITH MEDIAL AND LATERAL LIGAMENT REINFORCEMENTS;  Surgeon: [name], MD;  Location: [facility];  Service: Orthopedics;  Laterality: Left;   ORIF PATELLA Left 05/30/2020   Procedure: OPEN REDUCTION INTERNAL (ORIF) FIXATION PATELLA;  Surgeon: [name], MD;  Location: [facility];  Service: Orthopedics;  Laterality: Left;   RETINAL DETACHMENT SURGERY N/A    two   REVERSE SHOULDER ARTHROPLASTY Left 05/06/2019   Procedure: REVERSE SHOULDER ARTHROPLASTY;  Surgeon: Justice Britain, MD;  Location: [facility];  Service: Orthopedics;  Laterality: Left;  170mn   ROTATOR CUFF REPAIR Right 2012   Dr. CTheda Sers  SQUAMOUS CELL CARCINOMA EXCISION Bilateral 2012, 8/14   [place] on legs   Dr. GSarajane Jews  TONSILLECTOMY  1941   VIDEO BRONCHOSCOPY WITH ENDOBRONCHIAL NAVIGATION N/A 11/29/2015   Procedure: VIDEO BRONCHOSCOPY WITH ENDOBRONCHIAL NAVIGATION;  Surgeon: RCollene Gobble MD;  [place] [facility]  Service: Thoracic;  Laterality: N/A;    Allergies  Allergen Reactions   Vioxx [Rofecoxib] Shortness Of Breath   Dyazide [Hydrochlorothiazide W-Triamterene] Other (See Comments)    Lowers blood pressure too much   Latex Swelling and Other (See Comments)    "Allergic," per MAR   Sulfa Antibiotics Nausea And Vomiting and Other (See Comments)    [facility]  Sumycin [Tetracycline] Other (See Comments)    Can[facility] take due to drug reaction- [facility]   Allergies as of 01/01/2022       Reactions   Vioxx [rofecoxib] Shortness Of Breath    Dyazide [hydrochlorothiazide W-triamterene] Other (See Comments)   Lowers blood pressure too much   Latex Swelling, Other (See Comments)   "Allergic," per MAR   Sulfa Antibiotics Nausea And Vomiting, Other (See Comments)   [facility]  Sumycin [tetracycline] Other (See Comments)   Can[facility] take due to drug reaction- [facility]       Medication List        Accurate as of January 01, 2022 11:59 PM. If you have any questions, ask your nurse or doctor.          cephALEXin 500 MG capsule Commonly known as: KEFLEX  Take 500 mg by mouth every 8 (eight) hours. Cellulitis of left lower limb   furosemide 20 MG tablet Commonly known as: LASIX Take 20 mg by mouth. Once A Day on Mon, Thu   hydrocortisone cream 1 % Apply 1 application  topically 2 (two) times daily as needed for itching (to scaly areas of face and ears).   hydrocortisone 2.5 % lotion Apply 1 application  topically 2 (two) times daily as needed (to scaly areas of face and ears).   ketoconazole 2 % shampoo Commonly known as: NIZORAL Apply 1 application  topically See admin instructions. Shampoo on Mondays and Thursdays   ketoconazole 2 % cream Commonly known as: NIZORAL Apply 1 application  topically daily as needed (to affected areas for psoriasis).   LORazepam 1 MG tablet Commonly known as: ATIVAN Take 1 mg by mouth every 4 (four) hours as needed for anxiety.   nystatin powder Commonly known as: MYCOSTATIN/NYSTOP Apply 1 application  topically 2 (two) times daily as needed (urogenital area).   potassium chloride SA 20 MEQ tablet Commonly known as: KLOR-CON M Take 20 mEq by mouth daily.   pramipexole 0.5 MG tablet Commonly known as: MIRAPEX Take 0.5 mg by mouth at bedtime.   ProAir RespiClick 169 (90 Base) MCG/ACT Aepb Generic drug: Albuterol Sulfate Inhale 2 puffs into the lungs See admin instructions. Inhale 2 puffs into the lungs every morning as needed for shortness of breath or  wheezing   saccharomyces boulardii 250 MG capsule Commonly known as: FLORASTOR Take 250 mg by mouth 2 (two) times daily. Encounter for prophylactic measures, unspecified   triamcinolone cream 0.1 % Commonly known as: KENALOG Apply 1 application. topically daily as needed (psoriasis).        Review of Systems  Constitutional:  Negative for appetite change, fatigue and fever.  HENT:  Positive for hearing loss. Negative for trouble swallowing.   Eyes:  Negative for visual disturbance.  Respiratory:  Negative for cough.        DOE is chronic  Cardiovascular:  Positive for leg swelling.  Gastrointestinal:  Negative for abdominal pain and constipation.       Acid reflux symptoms.   Genitourinary:        SPC  Musculoskeletal:  Positive for arthralgias and gait problem.       S/p left hip ORIF, ORIF of the left patella.   Skin:  Positive for wound. Negative for color change.       BLE discoloration, chronic venous insufficiency skin changes, lateral left lower leg slow healing ruptured hematoma. A quarter sized red, warmth, raised, fluid filled area on the previous left knee surgical incision. No change of ROM of the left or ballottement or pain of the left knee noted.    Neurological:  Negative for seizures, speech difficulty, weakness and headaches.       Memory lapses. Hx of seizures. RLS  Psychiatric/Behavioral:  Negative for behavioral problems, dysphoric mood and sleep disturbance. The patient is not nervous/anxious.     Immunization History  Administered Date(s) Administered   Influenza Split 01/06/2014, 01/15/2017, 01/08/2018, 12/09/2018   Influenza Whole 01/07/2012, 01/06/2013   Influenza, High Dose Seasonal PF 12/25/2015, 01/20/2017   Influenza,inj,Quad PF,6+ Mos 12/21/2014   Influenza-Unspecified 12/09/2018, 01/18/2020, 01/24/2021   Moderna SARS-COV2 Booster Vaccination 04/02/2021   Moderna Sars-Covid-2 Vaccination 04/12/2019, 06/05/2019, 02/15/2020, 09/05/2020    PFIZER(Purple Top)SARS-COV-2 Vaccination 12/26/2020   Pneumococcal Conjugate-13 02/01/2014   Pneumococcal Polysaccharide-23 12/20/1992, 01/15/2000, 04/08/2004, 06/08/2004   Td 04/08/2002, 04/22/2002  Tdap 04/09/2011, 08/17/2015   Zoster Recombinat (Shingrix) 05/29/2005, 08/27/2017   Zoster, Live 04/08/2008, 05/29/2014, 05/28/2017   Zoster, Unspecified 05/29/2005   Pertinent  Health Maintenance Due  Topic Date Due   INFLUENZA VACCINE  11/06/2021   DEXA SCAN  Completed   MAMMOGRAM  Discontinued      09/07/2021    9:30 PM 09/08/2021   12:40 PM 11/04/2021   10:37 AM 11/04/2021   11:24 PM 11/05/2021   10:59 AM  Fall Risk  Patient Fall Risk Level High fall risk High fall risk Moderate fall risk High fall risk High fall risk   Functional Status Survey:    Vitals:   01/01/22 1037  BP: 123/73  Pulse: 98  Resp: 16  Temp: (!) 97.3 F (36.3 C)  SpO2: 98%   There is no height or weight on file to calculate BMI. Physical Exam Vitals and nursing note reviewed.  Constitutional:      Appearance: Normal appearance.  HENT:     Head: Normocephalic and atraumatic.     Nose: Nose normal.     Mouth/Throat:     Mouth: Mucous membranes are moist.  Eyes:     Extraocular Movements: Extraocular movements intact.     Conjunctiva/sclera: Conjunctivae normal.     Right eye: Right conjunctiva is not injected.     Left eye: Left conjunctiva is not injected.     Pupils: Pupils are equal, round, and reactive to light.  Neck:     Comments: Left adam's apple bony aspect, sometimes feels soreness on palpation is chronic, no noted lymph nodes  Cardiovascular:     Rate and Rhythm: Normal rate and regular rhythm.     Heart sounds: Murmur heard.     Comments: PD pulses are not felt from previous examination.  Pulmonary:     Effort: Pulmonary effort is normal.     Breath sounds: Rales present.     Comments: Decreased air entry to both lungs. Bibasilar rales.  Abdominal:     General: Bowel sounds are  normal.     Palpations: Abdomen is soft.     Tenderness: There is no abdominal tenderness.     Comments: Mid abd surgical scar  Musculoskeletal:     Cervical back: Normal range of motion and neck supple.     Right lower leg: Edema present.     Left lower leg: Edema present.     Comments: Decreased overhead ROM of the left shoulder. Left knee s/p ORIF of the patella fx. S/p left hip ORIF LLE 2+, RLE 1+  Skin:    General: Skin is warm and dry.     Findings: Erythema present.     Comments: Mid back stage 2 pressure ulcer, no s/s of infection. BLE discoloration, chronic venous insufficiency skin changes, lateral left lower leg slow healing previous ruptured hematoma. A quarter sized red, warmth, raised, fluid filled area on the previous left knee surgical incision. No change of ROM of the left or ballottement or pain of the left knee noted.    Neurological:     General: No focal deficit present.     Mental Status: She is alert. Mental status is at baseline.     Gait: Gait abnormal.  Psychiatric:     Comments: Smiled, conversed appropriately during my visit today.      Labs reviewed: Recent Labs    09/02/21 0359 09/04/21 1308 09/06/21 0449 11/04/21 1047 11/13/21 0000 12/11/21 0000 12/13/21 0000  NA 138 144   < >  145 144 142 144  K 3.2* 3.9   < > 3.2* 3.7 3.0* 3.3*  CL 104 108   < > 109 103 107 109*  CO2 27 26   < > 27 28* 27* 28*  GLUCOSE 98 102*  --  80  --   --   --   BUN 21 31*   < > 16 28* 10 11  CREATININE 0.78 0.88   < > 0.69 0.7 0.6 0.6  CALCIUM 8.5* 8.8*   < > 9.6 9.3 8.2* 8.5*  MG 2.2  --   --   --   --   --   --    < > = values in this interval not displayed.   Recent Labs    11/02/21 0000 11/04/21 1047 12/11/21 0000  AST 19 20 12*  ALT '11 12 8  '$ ALKPHOS 160* 157* 137*  BILITOT  --  0.5  --   PROT  --  7.0  --   ALBUMIN 3.7 3.6 3.0*   Recent Labs    09/02/21 0359 09/04/21 1308 09/11/21 0000 11/02/21 0000 11/04/21 1047 12/11/21 0000  WBC 7.8 7.7   <  > 4.3 4.7 4.0  NEUTROABS  --   --    < > 2,275.00 3.2 2,352.00  HGB 9.4* 10.0*   < > 11.6* 12.9 11.7*  HCT 30.6* 32.5*   < > 36 41.6 37  MCV 85.2 86.4  --   --  83.7  --   PLT 190 239   < > 243 252 179   < > = values in this interval not displayed.   Lab Results  Component Value Date   TSH 1.89 05/22/2021   No results found for: "HGBA1C" Lab Results  Component Value Date   CHOL 147 09/27/2021   HDL 48 09/27/2021   LDLCALC 78 09/27/2021   TRIG 131 09/27/2021   CHOLHDL 3.6 06/27/2015    Significant Diagnostic Results in last 30 days:  No results found.  Assessment/Plan: Cellulitis of skin a quarter sized red, warmth, raised, fluid filled area on the previous left knee surgical incision. No change of ROM of the left or ballottement or pain of the left knee noted. Doxycycline '100mg'$  bid x 7 days, warm compress L knee area 66mn/each qid x 7 days. Observe.   Suprapubic catheter (HCastleberry present  Hypokalemia supplemented, K 3.7 12/20/21  Edema on Furosemide.   Adult failure to thrive seems better in mood, prn Lorazepam used a few times, no Haldol or Morphine needed.   Major depressive disorder, recurrent (HCC)  stable, no s/s of psychosis, prn Lorazepam is adequate   Restless legs syndrome (RLS) stable, taking MiraPex.   COPD (chronic obstructive pulmonary disease) (HCC) Stable, taking ProAir.    Family/ staff Communication:  plan of care reviewed with the patient and charge nurse.   Labs/tests ordered: none  Time spend 35 minutes.

## 2022-01-01 NOTE — Assessment & Plan Note (Signed)
present

## 2022-01-01 NOTE — Assessment & Plan Note (Signed)
seems better in mood, prn Lorazepam used a few times, no Haldol or Morphine needed.

## 2022-01-01 NOTE — Assessment & Plan Note (Signed)
a quarter sized red, warmth, raised, fluid filled area on the previous left knee surgical incision. No change of ROM of the left or ballottement or pain of the left knee noted. Doxycycline '100mg'$  bid x 7 days, warm compress L knee area 71mn/each qid x 7 days. Observe.

## 2022-01-01 NOTE — Assessment & Plan Note (Signed)
stable, no s/s of psychosis, prn Lorazepam is adequate  

## 2022-01-01 NOTE — Assessment & Plan Note (Addendum)
supplemented, K 3.7 12/20/21 

## 2022-01-01 NOTE — Assessment & Plan Note (Signed)
Stable, taking ProAir.

## 2022-01-01 NOTE — Assessment & Plan Note (Signed)
stable, taking MiraPex.  

## 2022-01-08 ENCOUNTER — Encounter: Payer: Self-pay | Admitting: Nurse Practitioner

## 2022-01-14 ENCOUNTER — Non-Acute Institutional Stay (SKILLED_NURSING_FACILITY): Payer: Medicare PPO | Admitting: Nurse Practitioner

## 2022-01-14 ENCOUNTER — Encounter: Payer: Self-pay | Admitting: Nurse Practitioner

## 2022-01-14 DIAGNOSIS — F0390 Unspecified dementia without behavioral disturbance: Secondary | ICD-10-CM | POA: Diagnosis not present

## 2022-01-14 DIAGNOSIS — G2581 Restless legs syndrome: Secondary | ICD-10-CM

## 2022-01-14 DIAGNOSIS — G40909 Epilepsy, unspecified, not intractable, without status epilepticus: Secondary | ICD-10-CM | POA: Diagnosis not present

## 2022-01-14 DIAGNOSIS — F323 Major depressive disorder, single episode, severe with psychotic features: Secondary | ICD-10-CM | POA: Diagnosis not present

## 2022-01-14 DIAGNOSIS — R609 Edema, unspecified: Secondary | ICD-10-CM

## 2022-01-14 DIAGNOSIS — E876 Hypokalemia: Secondary | ICD-10-CM

## 2022-01-14 DIAGNOSIS — R627 Adult failure to thrive: Secondary | ICD-10-CM

## 2022-01-14 DIAGNOSIS — Z87898 Personal history of other specified conditions: Secondary | ICD-10-CM

## 2022-01-14 DIAGNOSIS — J431 Panlobular emphysema: Secondary | ICD-10-CM

## 2022-01-14 NOTE — Assessment & Plan Note (Signed)
no active seizures, off meds.  

## 2022-01-14 NOTE — Assessment & Plan Note (Signed)
under hospice service 

## 2022-01-14 NOTE — Assessment & Plan Note (Signed)
Urology 01/09/22 catheter plug in the next 2 weeks, goal if to remove [facility] or replacement

## 2022-01-14 NOTE — Assessment & Plan Note (Signed)
on Furosemide.

## 2022-01-14 NOTE — Assessment & Plan Note (Signed)
Stable, on ProAir 

## 2022-01-14 NOTE — Progress Notes (Unsigned)
Location:   SNF [place] Room Number: 33 Place of Service:  SNF (31) Provider: [facility] Orabelle Rylee NP  [name], MD  Patient Care Team: [name], MD as PCP - General (Internal Medicine) [name], MD as Consulting Physician (Ophthalmology) [name], MD as Consulting Physician (Cardiology) [name], MD as Consulting Physician (Dermatology) [name], MD as Consulting Physician (Orthopedic Surgery) [name], MD as Consulting Physician (Orthopedic Surgery) [name], MD as Consulting Physician (Orthopedic Surgery) [place], [place], Zera Markwardt X, NP as Nurse Practitioner (Nurse Practitioner) [name], MD (Inactive) as Consulting Physician (Neurology)  Extended Emergency Contact Information Primary Emergency Contact: [name],[name] Address: [address]          [place], [place] [address] [name] of [place] Phone: [phone] Relation: Sister Secondary Emergency Contact: [name] [place] Phone: [phone] Mobile Phone: [phone] Relation: Sister  Code Status:  DNR Goals of care: Advanced Directive information    12/18/2021   10:46 AM  Advanced Directives  Does Patient Have a Medical Advance Directive? Yes  Type of Advance Directive Out of facility DNR (pink MOST or yellow form)  Does patient want to make changes to medical advance directive? No - Patient declined  Pre-existing out of facility DNR order (yellow form or pink MOST form) Pink MOST/Yellow Form most recent copy in chart - Physician notified to receive inpatient order     Chief Complaint  Patient presents with   Medical Management of Chronic Issues    HPI:  Pt is a 88 y.o. female seen today for medical management of chronic diseases.      Hypokalemia, supplemented, K 3.7 12/20/21             BLE edema,trace, on Furosemide.              Adult failure to thrive, stable, prn Lorazepam used a  few times, no Haldol or Morphine needed.              Suprapubic Catheter, Urology 01/09/22 catheter plug in the next 2 weeks, goal if to remove SPC or replacement             Depression, stable, no s/s of psychosis, prn Lorazepam is adequate              Dementia, under hospice service             Restless leg syndrome, stable, taking MiraPex.              Hx of seizures, no active seizures, off meds.              COPD, on ProAir  Past Medical History:  Diagnosis Date   Acute bronchitis 05/23/2011   Acute upper respiratory infections of unspecified site 05/23/2011   Arthritis    Chronic airway obstruction, not elsewhere classified 05/23/2011   Disturbance of salivary secretion 01/31/2011   Dizziness and giddiness 01/31/2011   Dyspnea    Essential tremor 04/25/2014   External hemorrhoids without mention of complication 09/73/5329   Gait disorder 04/25/2014   Insomnia, unspecified 09/12/2011   Lumbago 01/31/2011   Major depressive disorder, single episode, unspecified 01/31/2011   Memory disorder 04/25/2014   Mitral valve disorders(424.0) 01/31/2011   Other and unspecified hyperlipidemia 01/31/2011   Other convulsions 01/31/2011   Other emphysema ([place]) 01/31/2011   Pain in joint, site unspecified 01/31/2011  Restless legs syndrome (RLS) 09/12/2011   Retinal detachment with retinal defect of right eye 2011   right eye twice   Seizure disorder ([place])    Senile osteoporosis 01/31/2011   Spontaneous ecchymoses 01/31/2011   Stiffness of joints, not elsewhere classified, multiple sites 01/31/2011   Unspecified essential hypertension 01/31/2011   Past Surgical History:  Procedure Laterality Date   ABDOMINAL HYSTERECTOMY  06/21/2003   TAH/BSO, omenectomy PSB resect, Stg IC cystadenofibroma   CHOLECYSTECTOMY  2005   Dr. Marlou Starks   ELBOW SURGERY Right 2008   broken   Dr. Apolonio Schneiders   EYE SURGERY     INTRAMEDULLARY (IM) NAIL INTERTROCHANTERIC Left 08/30/2021   Procedure: INTRAMEDULLARY (IM) NAIL  INTERTROCHANTRIC;  Surgeon: Rod Can, MD;  Location: [facility];  Service: Orthopedics;  Laterality: Left;   ORIF PATELLA Left 05/02/2020   Procedure: OPEN REDUCTION INTERNAL (ORIF) FIXATION LEFT PATELLA WITH MEDIAL AND LATERAL LIGAMENT REINFORCEMENTS;  Surgeon: [name], MD;  Location: [facility];  Service: Orthopedics;  Laterality: Left;   ORIF PATELLA Left 05/30/2020   Procedure: OPEN REDUCTION INTERNAL (ORIF) FIXATION PATELLA;  Surgeon: [name], MD;  Location: [facility];  Service: Orthopedics;  Laterality: Left;   RETINAL DETACHMENT SURGERY N/A    two   REVERSE SHOULDER ARTHROPLASTY Left 05/06/2019   Procedure: REVERSE SHOULDER ARTHROPLASTY;  Surgeon: Justice Britain, MD;  Location: [facility];  Service: Orthopedics;  Laterality: Left;  115mn   ROTATOR CUFF REPAIR Right 2012   Dr. CTheda Sers  SQUAMOUS CELL CARCINOMA EXCISION Bilateral 2012, 8/14   [place] on legs   Dr. GSarajane Jews  TONSILLECTOMY  1941   VIDEO BRONCHOSCOPY WITH ENDOBRONCHIAL NAVIGATION N/A 11/29/2015   Procedure: VIDEO BRONCHOSCOPY WITH ENDOBRONCHIAL NAVIGATION;  Surgeon: RCollene Gobble MD;  [facility];  Service: Thoracic;  Laterality: N/A;    Allergies  Allergen Reactions   Vioxx [Rofecoxib] Shortness Of Breath   Dyazide [Hydrochlorothiazide W-Triamterene] Other (See Comments)    Lowers blood pressure too much   Latex Swelling and Other (See Comments)    "Allergic," per [facility]   Sulfa Antibiotics Nausea And Vomiting and Other (See Comments)    [facility]  Sumycin [Tetracycline] Other (See Comments)    Can[facility] take due to drug reaction- [facility]   Allergies as of 01/14/2022       Reactions   Vioxx [rofecoxib] Shortness Of Breath   Dyazide [hydrochlorothiazide W-triamterene] Other (See Comments)   Lowers blood pressure too much   Latex Swelling, Other (See Comments)   "Allergic," per MAR   Sulfa Antibiotics Nausea And Vomiting, Other (See Comments)   [facility]  Sumycin  [tetracycline] Other (See Comments)   Can't take due to drug reaction- [facility]       Medication List        Accurate as of January 14, 2022 11:59 PM. If you have any questions, ask your nurse or doctor.          cephALEXin 500 MG capsule Commonly known as: KEFLEX Take 500 mg by mouth every 8 (eight) hours. Cellulitis of left lower limb   furosemide 20 MG tablet Commonly known as: LASIX Take 20 mg by mouth. Once A Day on Mon, Thu   hydrocortisone cream 1 % Apply 1 application  topically 2 (two) times daily as needed for itching (to scaly areas of face and ears).   hydrocortisone 2.5 % lotion Apply 1 application  topically 2 (two)  times daily as needed (to scaly areas of face and ears).   ketoconazole 2 % shampoo Commonly known as: NIZORAL Apply 1 application  topically See admin instructions. Shampoo on Mondays and Thursdays   ketoconazole 2 % cream Commonly known as: NIZORAL Apply 1 application  topically daily as needed (to affected areas for psoriasis).   LORazepam 1 MG tablet Commonly known as: ATIVAN Take 1 mg by mouth every 4 (four) hours as needed for anxiety.   nystatin powder Commonly known as: MYCOSTATIN/NYSTOP Apply 1 application  topically 2 (two) times daily as needed (urogenital area).   potassium chloride SA 20 MEQ tablet Commonly known as: KLOR-CON M Take 20 mEq by mouth daily.   pramipexole 0.5 MG tablet Commonly known as: MIRAPEX Take 0.5 mg by mouth at bedtime.   ProAir RespiClick 007 (90 Base) MCG/ACT Aepb Generic drug: Albuterol Sulfate Inhale 2 puffs into the lungs See admin instructions. Inhale 2 puffs into the lungs every morning as needed for shortness of breath or wheezing   saccharomyces boulardii 250 MG capsule Commonly known as: FLORASTOR Take 250 mg by mouth 2 (two) times daily. Encounter for prophylactic measures, unspecified   triamcinolone cream 0.1 % Commonly known as: KENALOG Apply 1 application. topically  daily as needed (psoriasis).        Review of Systems  Constitutional:  Negative for appetite change, fatigue and fever.  HENT:  Positive for hearing loss. Negative for trouble swallowing.   Eyes:  Negative for visual disturbance.  Respiratory:  Negative for cough.        DOE is chronic  Cardiovascular:  Positive for leg swelling.  Gastrointestinal:  Negative for abdominal pain and constipation.       Acid reflux symptoms.   Genitourinary:        SPC  Musculoskeletal:  Positive for arthralgias and gait problem.       S/p left hip ORIF, ORIF of the left patella.   Skin:  Positive for wound. Negative for color change.       BLE discoloration, chronic venous insufficiency skin changes, lateral left lower leg slow healing ruptured hematoma. A quarter sized red, warmth, raised, fluid filled area on the previous left knee surgical incision-healed.    Neurological:  Negative for seizures, speech difficulty, weakness and headaches.       Memory lapses. Hx of seizures. RLS  Psychiatric/Behavioral:  Negative for behavioral problems, dysphoric mood and sleep disturbance. The patient is not nervous/anxious.     Immunization History  Administered Date(s) Administered   Influenza Split 01/06/2014, 01/15/2017, 01/08/2018, 12/09/2018   Influenza Whole 01/07/2012, 01/06/2013   Influenza, High Dose Seasonal PF 12/25/2015, 01/20/2017   Influenza,inj,Quad PF,6+ Mos 12/21/2014   Influenza-Unspecified 12/09/2018, 01/18/2020, 01/24/2021   Moderna SARS-COV2 Booster Vaccination 04/02/2021   Moderna Sars-Covid-2 Vaccination 04/12/2019, 06/05/2019, 02/15/2020, 09/05/2020   PFIZER(Purple Top)SARS-COV-2 Vaccination 12/26/2020   Pneumococcal Conjugate-13 02/01/2014   Pneumococcal Polysaccharide-23 12/20/1992, 01/15/2000, 04/08/2004, 06/08/2004   Td 04/08/2002, 04/22/2002   Tdap 04/09/2011, 08/17/2015   Zoster Recombinat (Shingrix) 05/29/2005, 08/27/2017   Zoster, Live 04/08/2008, 05/29/2014, 05/28/2017    Zoster, Unspecified 05/29/2005   Pertinent  Health Maintenance Due  Topic Date Due   INFLUENZA VACCINE  11/06/2021   DEXA SCAN  Completed   MAMMOGRAM  Discontinued      09/07/2021    9:30 PM 09/08/2021   12:40 PM 11/04/2021   10:37 AM 11/04/2021   11:24 PM 11/05/2021   10:59 AM  Fall Risk  Patient Fall Risk  Level High fall risk High fall risk Moderate fall risk High fall risk High fall risk   Functional Status Survey:    There were no vitals filed for this visit. There is no height or weight on file to calculate BMI. Physical Exam Vitals and nursing note reviewed.  Constitutional:      Appearance: Normal appearance.  HENT:     Head: Normocephalic and atraumatic.     Nose: Nose normal.     Mouth/Throat:     Mouth: Mucous membranes are moist.  Eyes:     Extraocular Movements: Extraocular movements intact.     Conjunctiva/sclera: Conjunctivae normal.     Right eye: Right conjunctiva is not injected.     Left eye: Left conjunctiva is not injected.     Pupils: Pupils are equal, round, and reactive to light.  Neck:     Comments: Left adam's apple bony aspect, sometimes feels soreness on palpation is chronic, no noted lymph nodes  Cardiovascular:     Rate and Rhythm: Normal rate and regular rhythm.     Heart sounds: Murmur heard.     Comments: PD pulses are not felt from previous examination.  Pulmonary:     Effort: Pulmonary effort is normal.     Breath sounds: Rales present.     Comments: Decreased air entry to both lungs. Bibasilar rales.  Abdominal:     General: Bowel sounds are normal.     Palpations: Abdomen is soft.     Tenderness: There is no abdominal tenderness.     Comments: Mid abd surgical scar  Musculoskeletal:     Cervical back: Normal range of motion and neck supple.     Right lower leg: Edema present.     Left lower leg: Edema present.     Comments: Decreased overhead ROM of the left shoulder. Left knee s/p ORIF of the patella fx. S/p left hip ORIF LLE  2+, RLE 1+  Skin:    General: Skin is warm and dry.     Comments: Mid back stage 2 pressure ulcer-healed.  BLE discoloration, chronic venous insufficiency skin changes, lateral left lower leg slow healing previous ruptured hematoma. A quarter sized red, warmth, raised, fluid filled area on the previous left knee surgical incision-resolved.    Neurological:     General: No focal deficit present.     Mental Status: She is alert. Mental status is at baseline.     Gait: Gait abnormal.  Psychiatric:     Comments: Smiled, conversed appropriately during my visit today.      Labs reviewed: Recent Labs    09/02/21 0359 09/04/21 1308 09/06/21 0449 11/04/21 1047 11/13/21 0000 12/11/21 0000 12/13/21 0000  NA 138 144   < > 145 144 142 144  K 3.2* 3.9   < > 3.2* 3.7 3.0* 3.3*  CL 104 108   < > 109 103 107 109*  CO2 27 26   < > 27 28* 27* 28*  GLUCOSE 98 102*  --  80  --   --   --   BUN 21 31*   < > 16 28* 10 11  CREATININE 0.78 0.88   < > 0.69 0.7 0.6 0.6  CALCIUM 8.5* 8.8*   < > 9.6 9.3 8.2* 8.5*  MG 2.2  --   --   --   --   --   --    < > = values in this interval not displayed.   Recent Labs  11/02/21 0000 11/04/21 1047 12/11/21 0000  AST 19 20 12*  ALT '11 12 8  '$ ALKPHOS 160* 157* 137*  BILITOT  --  0.5  --   PROT  --  7.0  --   ALBUMIN 3.7 3.6 3.0*   Recent Labs    09/02/21 0359 09/04/21 1308 09/11/21 0000 11/02/21 0000 11/04/21 1047 12/11/21 0000  WBC 7.8 7.7   < > 4.3 4.7 4.0  NEUTROABS  --   --    < > 2,275.00 3.2 2,352.00  HGB 9.4* 10.0*   < > 11.6* 12.9 11.7*  HCT 30.6* 32.5*   < > 36 41.6 37  MCV 85.2 86.4  --   --  83.7  --   PLT 190 239   < > 243 252 179   < > = values in this interval not displayed.   Lab Results  Component Value Date   TSH 1.89 05/22/2021   No results found for: "HGBA1C" Lab Results  Component Value Date   CHOL 147 09/27/2021   HDL 48 09/27/2021   LDLCALC 78 09/27/2021   TRIG 131 09/27/2021   CHOLHDL 3.6 06/27/2015     Significant Diagnostic Results in last 30 days:  No results found.  Assessment/Plan  Depression, psychotic (HCC)  stable, no s/s of psychosis, prn Lorazepam is adequate   Senile dementia ([place]) under hospice service  Restless legs syndrome (RLS) stable, taking MiraPex.   Seizure disorder ([place]) no active seizures, off meds.   COPD (chronic obstructive pulmonary disease) (HCC) Stable,  on ProAir  History of urinary retention  Urology 01/09/22 catheter plug in the next 2 weeks, goal if to remove SPC or replacement  Adult failure to thrive stable, prn Lorazepam used a few times, no Haldol or Morphine needed.   Edema  on Furosemide.   Hypokalemia supplemented, K 3.7 12/20/21   Family/ staff Communication: plan of care reviewed with the patient and charge nurse  Labs/tests ordered:  none  Time spend 25 minutes.

## 2022-01-14 NOTE — Assessment & Plan Note (Signed)
stable, taking MiraPex.  

## 2022-01-14 NOTE — Assessment & Plan Note (Signed)
stable, prn Lorazepam used a few times, no Haldol or Morphine needed.  

## 2022-01-14 NOTE — Assessment & Plan Note (Signed)
supplemented, K 3.7 12/20/21 

## 2022-01-14 NOTE — Assessment & Plan Note (Signed)
stable, no s/s of psychosis, prn Lorazepam is adequate  

## 2022-01-22 ENCOUNTER — Encounter: Payer: Self-pay | Admitting: Nurse Practitioner

## 2022-01-22 ENCOUNTER — Non-Acute Institutional Stay (SKILLED_NURSING_FACILITY): Payer: Medicare PPO | Admitting: Nurse Practitioner

## 2022-01-22 DIAGNOSIS — G40909 Epilepsy, unspecified, not intractable, without status epilepticus: Secondary | ICD-10-CM

## 2022-01-22 DIAGNOSIS — R300 Dysuria: Secondary | ICD-10-CM

## 2022-01-22 DIAGNOSIS — G2581 Restless legs syndrome: Secondary | ICD-10-CM

## 2022-01-22 DIAGNOSIS — R627 Adult failure to thrive: Secondary | ICD-10-CM | POA: Diagnosis not present

## 2022-01-22 DIAGNOSIS — Z9359 Other cystostomy status: Secondary | ICD-10-CM | POA: Diagnosis not present

## 2022-01-22 DIAGNOSIS — F0390 Unspecified dementia without behavioral disturbance: Secondary | ICD-10-CM

## 2022-01-22 DIAGNOSIS — R609 Edema, unspecified: Secondary | ICD-10-CM | POA: Diagnosis not present

## 2022-01-22 DIAGNOSIS — J431 Panlobular emphysema: Secondary | ICD-10-CM

## 2022-01-22 DIAGNOSIS — F331 Major depressive disorder, recurrent, moderate: Secondary | ICD-10-CM

## 2022-01-22 NOTE — Assessment & Plan Note (Signed)
stable, no s/s of psychosis, prn Lorazepam is adequate  

## 2022-01-22 NOTE — Assessment & Plan Note (Addendum)
No further c/o burning sensation upon urination and increased urinary frequency. Denied abd or lower back pain, she is afebrile and in her usual sate of health.  Monitor for s/s of UTI

## 2022-01-22 NOTE — Assessment & Plan Note (Signed)
BLE edema,trace, on Furosemide.

## 2022-01-22 NOTE — Assessment & Plan Note (Signed)
Urology 01/09/22 catheter plug in the next 2 weeks, goal if to remove [facility] or replacement

## 2022-01-22 NOTE — Assessment & Plan Note (Signed)
no active seizures, off meds.  

## 2022-01-22 NOTE — Assessment & Plan Note (Signed)
Stable, on ProAir 

## 2022-01-22 NOTE — Assessment & Plan Note (Signed)
stable, taking MiraPex.  

## 2022-01-22 NOTE — Assessment & Plan Note (Signed)
under hospice service 

## 2022-01-22 NOTE — Assessment & Plan Note (Signed)
stable, prn Lorazepam used a few times, no Haldol or Morphine needed.  

## 2022-01-22 NOTE — Progress Notes (Unsigned)
Location:   S[facility] [place] Room Number: 65 Place of Service:  SNF (31) Provider: [facility] Joshuwa Vecchio NP  [name], MD  Patient Care Team: [name], MD as PCP - General (Internal Medicine) [name], MD as Consulting Physician (Ophthalmology) [name], MD as Consulting Physician (Cardiology) [name], MD as Consulting Physician (Dermatology) [name], MD as Consulting Physician (Orthopedic Surgery) [name], MD as Consulting Physician (Orthopedic Surgery) [name], MD as Consulting Physician (Orthopedic Surgery) [place], [place], Rea Reser X, NP as Nurse Practitioner (Nurse Practitioner) [name], MD (Inactive) as Consulting Physician (Neurology)  Extended Emergency Contact Information Primary Emergency Contact: [name],[name] Address: [address]          [place], [place] [address] [name] of [place] Phone: [phone] Relation: Sister Secondary Emergency Contact: [name] [place] of [place] Phone: [phone] Mobile Phone: [phone] Relation: Sister  Code Status: DNR Goals of care: Advanced Directive information    12/18/2021   10:46 AM  Advanced Directives  Does Patient Have a Medical Advance Directive? Yes  Type of Advance Directive Out of facility DNR (pink MOST or yellow form)  Does patient want to make changes to medical advance directive? No - Patient declined  Pre-existing out of facility DNR order (yellow form or pink MOST form) Pink MOST/Yellow Form most recent copy in chart - Physician notified to receive inpatient order     Chief Complaint  Patient presents with   Acute Visit    C/o urinary frequency, burning sensation upon urination.     HPI:  Pt is a 88 y.o. female seen today for an acute visit for c/o resolved burning sensation upon urination and increased urinary frequency. Denied abd or lower back pain, she is afebrile and in her usual  sate of health.      Hypokalemia, supplemented, K 3.7 12/20/21             BLE edema,trace, on Furosemide.              Adult failure to thrive, stable, prn Lorazepam used a few times, no Haldol or Morphine needed.              Suprapubic Catheter, Urology 01/09/22 catheter plug in the next 2 weeks, goal if to remove SPC or replacement             Depression, stable, no s/s of psychosis, prn Lorazepam is adequate              Dementia, under hospice service             Restless leg syndrome, stable, taking MiraPex.              Hx of seizures, no active seizures, off meds.              COPD, on ProAir  Past Medical History:  Diagnosis Date   Acute bronchitis 05/23/2011   Acute upper respiratory infections of unspecified site 05/23/2011   Arthritis    Chronic airway obstruction, not elsewhere classified 05/23/2011   Disturbance of salivary secretion 01/31/2011   Dizziness and giddiness 01/31/2011   Dyspnea    Essential tremor 04/25/2014   External hemorrhoids without mention of complication 26/94/8546   Gait disorder 04/25/2014   Insomnia, unspecified 09/12/2011   Lumbago 01/31/2011   Major depressive disorder, single episode, unspecified 01/31/2011   Memory disorder 04/25/2014  Mitral valve disorders(424.0) 01/31/2011   Other and unspecified hyperlipidemia 01/31/2011   Other convulsions 01/31/2011   Other emphysema ([place]) 01/31/2011   Pain in joint, site unspecified 01/31/2011   Restless legs syndrome (RLS) 09/12/2011   Retinal detachment with retinal defect of right eye 2011   right eye twice   Seizure disorder ([place])    Senile osteoporosis 01/31/2011   Spontaneous ecchymoses 01/31/2011   Stiffness of joints, not elsewhere classified, multiple sites 01/31/2011   Unspecified essential hypertension 01/31/2011   Past Surgical History:  Procedure Laterality Date   ABDOMINAL HYSTERECTOMY  06/21/2003   TAH/BSO, omenectomy PSB resect, Stg IC cystadenofibroma   CHOLECYSTECTOMY  2005   Dr.  Marlou Starks   ELBOW SURGERY Right 2008   broken   Dr. Apolonio Schneiders   EYE SURGERY     INTRAMEDULLARY (IM) NAIL INTERTROCHANTERIC Left 08/30/2021   Procedure: INTRAMEDULLARY (IM) NAIL INTERTROCHANTRIC;  Surgeon: Rod Can, MD;  Location: [facility];  Service: Orthopedics;  Laterality: Left;   ORIF PATELLA Left 05/02/2020   Procedure: OPEN REDUCTION INTERNAL (ORIF) FIXATION LEFT PATELLA WITH MEDIAL AND LATERAL LIGAMENT REINFORCEMENTS;  Surgeon: [name], MD;  Location: [facility];  Service: Orthopedics;  Laterality: Left;   ORIF PATELLA Left 05/30/2020   Procedure: OPEN REDUCTION INTERNAL (ORIF) FIXATION PATELLA;  Surgeon: [name], MD;  Location: [facility];  Service: Orthopedics;  Laterality: Left;   RETINAL DETACHMENT SURGERY N/A    two   REVERSE SHOULDER ARTHROPLASTY Left 05/06/2019   Procedure: REVERSE SHOULDER ARTHROPLASTY;  Surgeon: Justice Britain, MD;  Location: [facility];  Service: Orthopedics;  Laterality: Left;  11mn   ROTATOR CUFF REPAIR Right 2012   Dr. CTheda Sers  SQUAMOUS CELL CARCINOMA EXCISION Bilateral 2012, 8/14   [place] on legs   Dr. GSarajane Jews  TONSILLECTOMY  1941   VIDEO BRONCHOSCOPY WITH ENDOBRONCHIAL NAVIGATION N/A 11/29/2015   Procedure: VIDEO BRONCHOSCOPY WITH ENDOBRONCHIAL NAVIGATION;  Surgeon: RCollene Gobble MD;  [facility];  Service: Thoracic;  Laterality: N/A;    Allergies  Allergen Reactions   Vioxx [Rofecoxib] Shortness Of Breath   Dyazide [Hydrochlorothiazide W-Triamterene] Other (See Comments)    Lowers blood pressure too much   Latex Swelling and Other (See Comments)    "Allergic," per MAR   Sulfa Antibiotics Nausea And Vomiting and Other (See Comments)    [facility]  Sumycin [Tetracycline] Other (See Comments)    [facility] take due to drug reaction- [facility]   Allergies as of 01/22/2022       Reactions   Vioxx [rofecoxib] Shortness Of Breath   Dyazide [hydrochlorothiazide W-triamterene] Other (See Comments)   Lowers blood pressure  too much   Latex Swelling, Other (See Comments)   "Allergic," per MAR   Sulfa Antibiotics Nausea And Vomiting, Other (See Comments)   [facility]  Sumycin [tetracycline] Other (See Comments)   Can[facility] take due to drug reaction- [facility]       Medication List        Accurate as of January 22, 2022 11:59 PM. If you have any questions, ask your nurse or doctor.          cephALEXin 500 MG capsule Commonly known as: KEFLEX Take 500 mg by mouth every 8 (eight) hours. Cellulitis of left lower limb   furosemide 20 MG tablet Commonly known as: LASIX Take 20 mg by mouth. Once A Day on Mon, Thu   hydrocortisone cream 1 % Apply  1 application  topically 2 (two) times daily as needed for itching (to scaly areas of face and ears).   hydrocortisone 2.5 % lotion Apply 1 application  topically 2 (two) times daily as needed (to scaly areas of face and ears).   ketoconazole 2 % shampoo Commonly known as: NIZORAL Apply 1 application  topically See admin instructions. Shampoo on Mondays and Thursdays   ketoconazole 2 % cream Commonly known as: NIZORAL Apply 1 application  topically daily as needed (to affected areas for psoriasis).   LORazepam 1 MG tablet Commonly known as: ATIVAN Take 1 mg by mouth every 4 (four) hours as needed for anxiety.   nystatin powder Commonly known as: MYCOSTATIN/NYSTOP Apply 1 application  topically 2 (two) times daily as needed (urogenital area).   potassium chloride SA 20 MEQ tablet Commonly known as: KLOR-CON M Take 20 mEq by mouth daily.   pramipexole 0.5 MG tablet Commonly known as: MIRAPEX Take 0.5 mg by mouth at bedtime.   ProAir RespiClick 409 (90 Base) MCG/ACT Aepb Generic drug: Albuterol Sulfate Inhale 2 puffs into the lungs See admin instructions. Inhale 2 puffs into the lungs every morning as needed for shortness of breath or wheezing   saccharomyces boulardii 250 MG capsule Commonly known as: FLORASTOR Take 250 mg  by mouth 2 (two) times daily. Encounter for prophylactic measures, unspecified   triamcinolone cream 0.1 % Commonly known as: KENALOG Apply 1 application. topically daily as needed (psoriasis).        Review of Systems  Constitutional:  Negative for appetite change, fatigue and fever.  HENT:  Positive for hearing loss. Negative for trouble swallowing.   Eyes:  Negative for visual disturbance.  Respiratory:  Negative for cough.        DOE is chronic  Cardiovascular:  Positive for leg swelling.  Gastrointestinal:  Negative for abdominal pain and constipation.       Acid reflux symptoms.   Genitourinary:  Negative for dysuria, frequency, hematuria and urgency.       [facility]  Musculoskeletal:  Positive for arthralgias and gait problem.       S/p left hip ORIF, ORIF of the left patella.   Skin:  Positive for wound. Negative for color change.       BLE discoloration, chronic venous insufficiency skin changes, lateral left lower leg slow healing ruptured hematoma. A quarter sized red, warmth, raised, fluid filled area on the previous left knee surgical incision-healed.    Neurological:  Negative for seizures, speech difficulty, weakness and headaches.       Memory lapses. Hx of seizures. RLS  Psychiatric/Behavioral:  Negative for behavioral problems, dysphoric mood and sleep disturbance. The patient is not nervous/anxious.     Immunization History  Administered Date(s) Administered   Influenza Split 01/06/2014, 01/15/2017, 01/08/2018, 12/09/2018   Influenza Whole 01/07/2012, 01/06/2013   Influenza, High Dose Seasonal PF 12/25/2015, 01/20/2017   Influenza,inj,Quad PF,6+ Mos 12/21/2014   Influenza-Unspecified 12/09/2018, 01/18/2020, 01/24/2021   Moderna SARS-COV2 Booster Vaccination 04/02/2021   Moderna Sars-Covid-2 Vaccination 04/12/2019, 06/05/2019, 02/15/2020, 09/05/2020   PFIZER(Purple Top)SARS-COV-2 Vaccination 12/26/2020   Pneumococcal Conjugate-13 02/01/2014   Pneumococcal  Polysaccharide-23 12/20/1992, 01/15/2000, 04/08/2004, 06/08/2004   Td 04/08/2002, 04/22/2002   Tdap 04/09/2011, 08/17/2015   Zoster Recombinat (Shingrix) 05/29/2005, 08/27/2017   Zoster, Live 04/08/2008, 05/29/2014, 05/28/2017   Zoster, Unspecified 05/29/2005   Pertinent  Health Maintenance Due  Topic Date Due   INFLUENZA VACCINE  11/06/2021   DEXA SCAN  Completed   MAMMOGRAM  Discontinued      09/07/2021    9:30 PM 09/08/2021   12:40 PM 11/04/2021   10:37 AM 11/04/2021   11:24 PM 11/05/2021   10:59 AM  Fall Risk  Patient Fall Risk Level High fall risk High fall risk Moderate fall risk High fall risk High fall risk   Functional Status Survey:    Vitals:   01/22/22 1351  BP: 102/78  Pulse: 96  Resp: 17  Temp: (!) 97.4 F (36.3 C)  SpO2: 94%  Weight: 142 lb 11.2 oz (64.7 kg)   Body mass index is 26.1 kg/m. Physical Exam Vitals and nursing note reviewed.  Constitutional:      Appearance: Normal appearance.  HENT:     Head: Normocephalic and atraumatic.     Nose: Nose normal.     Mouth/Throat:     Mouth: Mucous membranes are moist.  Eyes:     Extraocular Movements: Extraocular movements intact.     Conjunctiva/sclera: Conjunctivae normal.     Right eye: Right conjunctiva is not injected.     Left eye: Left conjunctiva is not injected.     Pupils: Pupils are equal, round, and reactive to light.  Neck:     Comments: Left adam's apple bony aspect, sometimes feels soreness on palpation is chronic, no noted lymph nodes  Cardiovascular:     Rate and Rhythm: Normal rate and regular rhythm.     Heart sounds: Murmur heard.     Comments: PD pulses are not felt from previous examination.  Pulmonary:     Effort: Pulmonary effort is normal.     Breath sounds: No rales.     Comments: Decreased air entry to both lungs.  Abdominal:     General: Bowel sounds are normal.     Palpations: Abdomen is soft.     Tenderness: There is no abdominal tenderness. There is no right CVA  tenderness, left CVA tenderness, guarding or rebound.     Comments: Mid abd surgical scar  Musculoskeletal:     Cervical back: Normal range of motion and neck supple.     Right lower leg: Edema present.     Left lower leg: Edema present.     Comments: Decreased overhead ROM of the left shoulder. Left knee s/p ORIF of the patella fx. S/p left hip ORIF LLE 2+, RLE 1+  Skin:    General: Skin is warm and dry.     Comments: BLE discoloration, chronic venous insufficiency skin changes, lateral left lower leg slow healing previous ruptured hematoma. A quarter sized red, warmth, raised, fluid filled area on the previous left knee surgical incision-resolved   Neurological:     General: No focal deficit present.     Mental Status: She is alert. Mental status is at baseline.     Gait: Gait abnormal.  Psychiatric:     Comments: Smiled, conversed appropriately during my visit today.      Labs reviewed: Recent Labs    09/02/21 0359 09/04/21 1308 09/06/21 0449 11/04/21 1047 11/13/21 0000 12/11/21 0000 12/13/21 0000  NA 138 144   < > 145 144 142 144  K 3.2* 3.9   < > 3.2* 3.7 3.0* 3.3*  CL 104 108   < > 109 103 107 109*  CO2 27 26   < > 27 28* 27* 28*  GLUCOSE 98 102*  --  80  --   --   --   BUN 21 31*   < > 16 28*  10 11  CREATININE 0.78 0.88   < > 0.69 0.7 0.6 0.6  CALCIUM 8.5* 8.8*   < > 9.6 9.3 8.2* 8.5*  MG 2.2  --   --   --   --   --   --    < > = values in this interval not displayed.   Recent Labs    11/02/21 0000 11/04/21 1047 12/11/21 0000  AST 19 20 12*  ALT '11 12 8  '$ ALKPHOS 160* 157* 137*  BILITOT  --  0.5  --   PROT  --  7.0  --   ALBUMIN 3.7 3.6 3.0*   Recent Labs    09/02/21 0359 09/04/21 1308 09/11/21 0000 11/02/21 0000 11/04/21 1047 12/11/21 0000  WBC 7.8 7.7   < > 4.3 4.7 4.0  NEUTROABS  --   --    < > 2,275.00 3.2 2,352.00  HGB 9.4* 10.0*   < > 11.6* 12.9 11.7*  HCT 30.6* 32.5*   < > 36 41.6 37  MCV 85.2 86.4  --   --  83.7  --   PLT 190 239   < >  243 252 179   < > = values in this interval not displayed.   Lab Results  Component Value Date   TSH 1.89 05/22/2021   No results found for: "HGBA1C" Lab Results  Component Value Date   CHOL 147 09/27/2021   HDL 48 09/27/2021   LDLCALC 78 09/27/2021   TRIG 131 09/27/2021   CHOLHDL 3.6 06/27/2015    Significant Diagnostic Results in last 30 days:  No results found.  Assessment/Plan: Dysuria No further c/o burning sensation upon urination and increased urinary frequency. Denied abd or lower back pain, she is afebrile and in her usual sate of health.  Monitor for s/s of UTI  Suprapubic catheter [facility]) Urology 01/09/22 catheter plug in the next 2 weeks, goal if to remove SPC or replacement  Edema BLE edema,trace, on Furosemide.   Adult failure to thrive stable, prn Lorazepam used a few times, no Haldol or Morphine needed.   Major depressive disorder, recurrent (HCC)  stable, no s/s of psychosis, prn Lorazepam is adequate   Senile dementia (HCC) under hospice service  Restless legs syndrome (RLS) stable, taking MiraPex.   Seizure disorder ([facility]) no active seizures, off meds.   COPD (chronic obstructive pulmonary disease) ([place]) Stable, on ProAir    Family/ staff Communication: plan of care reviewed with the patient, Hospice nurse, and charge nurse.   Labs/tests ordered:  none  Time spend  25 minutes.

## 2022-02-04 ENCOUNTER — Encounter: Payer: Self-pay | Admitting: Nurse Practitioner

## 2022-02-04 ENCOUNTER — Non-Acute Institutional Stay (SKILLED_NURSING_FACILITY): Payer: Medicare PPO | Admitting: Nurse Practitioner

## 2022-02-04 DIAGNOSIS — R627 Adult failure to thrive: Secondary | ICD-10-CM

## 2022-02-04 DIAGNOSIS — R300 Dysuria: Secondary | ICD-10-CM | POA: Diagnosis not present

## 2022-02-04 DIAGNOSIS — J431 Panlobular emphysema: Secondary | ICD-10-CM

## 2022-02-04 DIAGNOSIS — E876 Hypokalemia: Secondary | ICD-10-CM

## 2022-02-04 DIAGNOSIS — J302 Other seasonal allergic rhinitis: Secondary | ICD-10-CM

## 2022-02-04 DIAGNOSIS — J309 Allergic rhinitis, unspecified: Secondary | ICD-10-CM | POA: Insufficient documentation

## 2022-02-04 DIAGNOSIS — Z9359 Other cystostomy status: Secondary | ICD-10-CM

## 2022-02-04 DIAGNOSIS — R609 Edema, unspecified: Secondary | ICD-10-CM | POA: Diagnosis not present

## 2022-02-04 DIAGNOSIS — F0390 Unspecified dementia without behavioral disturbance: Secondary | ICD-10-CM

## 2022-02-04 DIAGNOSIS — G2581 Restless legs syndrome: Secondary | ICD-10-CM

## 2022-02-04 DIAGNOSIS — F331 Major depressive disorder, recurrent, moderate: Secondary | ICD-10-CM

## 2022-02-04 DIAGNOSIS — G40909 Epilepsy, unspecified, not intractable, without status epilepticus: Secondary | ICD-10-CM

## 2022-02-04 NOTE — Progress Notes (Addendum)
Location:   SNF [place] Room Number: 4 Place of Service:  SNF (31) Provider: [facility] Sariyah Corcino NP  [name], MD  Patient Care Team: [name], MD as PCP - General (Internal Medicine) [name], MD as Consulting Physician (Ophthalmology) [name], MD as Consulting Physician (Cardiology) [name], MD as Consulting Physician (Dermatology) [name], MD as Consulting Physician (Orthopedic Surgery) [name], MD as Consulting Physician (Orthopedic Surgery) [name], MD as Consulting Physician (Orthopedic Surgery) [place], Ashley, Lakethia Coppess X, NP as Nurse Practitioner (Nurse Practitioner) [name], MD (Inactive) as Consulting Physician (Neurology)  Extended Emergency Contact Information Primary Emergency Contact: [name],[name] Address: [address]          [place], [place] [address] [name] of [place] Phone: [phone] Relation: Sister Secondary Emergency Contact: [name] [place] of [place] Phone: [phone] Mobile Phone: [phone] Relation: Sister  Code Status:  DNR Goals of care: Advanced Directive information    12/18/2021   10:46 AM  Advanced Directives  Does Patient Have a Medical Advance Directive? Yes  Type of Advance Directive Out of facility DNR (pink MOST or yellow form)  Does patient want to make changes to medical advance directive? No - Patient declined  Pre-existing out of facility DNR order (yellow form or pink MOST form) Pink MOST/Yellow Form most recent copy in chart - Physician notified to receive inpatient order     Chief Complaint  Patient presents with   Medical Management of Chronic Issues    HPI:  Pt is a 88 y.o. female seen today for medical management of chronic diseases.      Burning sensation upon urination and increased urinary frequency, urine contains mucous.  Denied abd or lower back pain, she is afebrile and in her  usual sate of health.                            Hypokalemia, supplemented, K 3.7 12/20/21             BLE edema,trace, on Furosemide.              Adult failure to thrive, stable, prn Lorazepam used a few times, no Haldol or Morphine needed.              Suprapubic Catheter, Urology 01/09/22 catheter plug, goal if to remove SPC or replacement             Depression, stable, no s/s of psychosis, prn Lorazepam is adequate              Dementia, under hospice service             Restless leg syndrome, stable, taking MiraPex.              Hx of seizures, no active seizures, off meds.              COPD, on ProAir  Past Medical History:  Diagnosis Date   Acute bronchitis 05/23/2011   Acute upper respiratory infections of unspecified site 05/23/2011   Arthritis    Chronic airway obstruction, not elsewhere classified 05/23/2011   Disturbance of salivary secretion 01/31/2011   Dizziness and giddiness 01/31/2011   Dyspnea    Essential tremor 04/25/2014   External hemorrhoids without mention of complication 48/18/5631   Gait disorder 04/25/2014   Insomnia, unspecified  09/12/2011   Lumbago 01/31/2011   Major depressive disorder, single episode, unspecified 01/31/2011   Memory disorder 04/25/2014   Mitral valve disorders(424.0) 01/31/2011   Other and unspecified hyperlipidemia 01/31/2011   Other convulsions 01/31/2011   Other emphysema ([place]) 01/31/2011   Pain in joint, site unspecified 01/31/2011   Restless legs syndrome (RLS) 09/12/2011   Retinal detachment with retinal defect of right eye 2011   right eye twice   Seizure disorder ([place])    Senile osteoporosis 01/31/2011   Spontaneous ecchymoses 01/31/2011   Stiffness of joints, not elsewhere classified, multiple sites 01/31/2011   Unspecified essential hypertension 01/31/2011   Past Surgical History:  Procedure Laterality Date   ABDOMINAL HYSTERECTOMY  06/21/2003   TAH/BSO, omenectomy PSB resect, Stg IC cystadenofibroma   CHOLECYSTECTOMY  2005    Dr. Marlou Starks   ELBOW SURGERY Right 2008   broken   Dr. Apolonio Schneiders   EYE SURGERY     INTRAMEDULLARY (IM) NAIL INTERTROCHANTERIC Left 08/30/2021   Procedure: INTRAMEDULLARY (IM) NAIL INTERTROCHANTRIC;  Surgeon: Rod Can, MD;  Location: [facility];  Service: Orthopedics;  Laterality: Left;   ORIF PATELLA Left 05/02/2020   Procedure: OPEN REDUCTION INTERNAL (ORIF) FIXATION LEFT PATELLA WITH MEDIAL AND LATERAL LIGAMENT REINFORCEMENTS;  Surgeon: [name], MD;  Location: [facility];  Service: Orthopedics;  Laterality: Left;   ORIF PATELLA Left 05/30/2020   Procedure: OPEN REDUCTION INTERNAL (ORIF) FIXATION PATELLA;  Surgeon: [name], MD;  Location: [facility];  Service: Orthopedics;  Laterality: Left;   RETINAL DETACHMENT SURGERY N/A    two   REVERSE SHOULDER ARTHROPLASTY Left 05/06/2019   Procedure: REVERSE SHOULDER ARTHROPLASTY;  Surgeon: Justice Britain, MD;  Location: [facility];  Service: Orthopedics;  Laterality: Left;  16mn   ROTATOR CUFF REPAIR Right 2012   Dr. CTheda Sers  SQUAMOUS CELL CARCINOMA EXCISION Bilateral 2012, 8/14   [place] on legs   Dr. GSarajane Jews  TONSILLECTOMY  1941   VIDEO BRONCHOSCOPY WITH ENDOBRONCHIAL NAVIGATION N/A 11/29/2015   Procedure: VIDEO BRONCHOSCOPY WITH ENDOBRONCHIAL NAVIGATION;  Surgeon: RCollene Gobble MD;  [facility];  Service: Thoracic;  Laterality: N/A;    Allergies  Allergen Reactions   Vioxx [Rofecoxib] Shortness Of Breath   Dyazide [Hydrochlorothiazide W-Triamterene] Other (See Comments)    Lowers blood pressure too much   Latex Swelling and Other (See Comments)    "Allergic," per MAR   Sulfa Antibiotics Nausea And Vomiting and Other (See Comments)    [facility]," [facility]  Sumycin [Tetracycline] Other (See Comments)    Can't take due to drug reaction- "Allergic," [facility]   Allergies as of 02/04/2022       Reactions   Vioxx [rofecoxib] Shortness Of Breath   Dyazide [hydrochlorothiazide W-triamterene] Other (See Comments)   Lowers blood  pressure too much   Latex Swelling, Other (See Comments)   "Allergic," per MAR   Sulfa Antibiotics Nausea And Vomiting, Other (See Comments)   [facility]  Sumycin [tetracycline] Other (See Comments)   Can't take due to drug reaction- [facility]All[facility]       Medication List        Accurate as of February 04, 2022 11:59 PM. If you have any questions, ask your nurse or doctor.          STOP taking these medications    cephALEXin 500 MG capsule Commonly known as: KEFLEX Stopped by: Addilynn Mowrer X Kasandra Fehr, NP       TAKE these  medications    furosemide 20 MG tablet Commonly known as: LASIX Take 20 mg by mouth. Once A Day on Mon, Thu   hydrocortisone cream 1 % Apply 1 application  topically 2 (two) times daily as needed for itching (to scaly areas of face and ears).   hydrocortisone 2.5 % lotion Apply 1 application  topically 2 (two) times daily as needed (to scaly areas of face and ears).   ketoconazole 2 % shampoo Commonly known as: NIZORAL Apply 1 application  topically See admin instructions. Shampoo on Mondays and Thursdays   ketoconazole 2 % cream Commonly known as: NIZORAL Apply 1 application  topically daily as needed (to affected areas for psoriasis).   LORazepam 1 MG tablet Commonly known as: ATIVAN Take 1 mg by mouth every 4 (four) hours as needed for anxiety.   nystatin powder Commonly known as: MYCOSTATIN/NYSTOP Apply 1 application  topically 2 (two) times daily as needed (urogenital area).   potassium chloride SA 20 MEQ tablet Commonly known as: KLOR-CON M Take 20 mEq by mouth daily.   pramipexole 0.5 MG tablet Commonly known as: MIRAPEX Take 0.5 mg by mouth at bedtime.   ProAir RespiClick 462 (90 Base) MCG/ACT Aepb Generic drug: Albuterol Sulfate Inhale 2 puffs into the lungs See admin instructions. Inhale 2 puffs into the lungs every morning as needed for shortness of breath or wheezing   saccharomyces boulardii 250 MG capsule Commonly  known as: FLORASTOR Take 250 mg by mouth 2 (two) times daily. Encounter for prophylactic measures, unspecified   triamcinolone cream 0.1 % Commonly known as: KENALOG Apply 1 application. topically daily as needed (psoriasis).        Review of Systems  Constitutional:  Negative for appetite change, fatigue and fever.  HENT:  Positive for congestion and hearing loss. Negative for rhinorrhea, sinus pressure, sinus pain, sneezing, sore throat and trouble swallowing.   Eyes:  Negative for visual disturbance.  Respiratory:  Negative for cough.        DOE is chronic  Cardiovascular:  Positive for leg swelling.  Gastrointestinal:  Negative for abdominal pain and constipation.       Acid reflux symptoms.   Genitourinary:  Positive for dysuria. Negative for frequency, hematuria and urgency.       [facility]  Musculoskeletal:  Positive for arthralgias and gait problem.       S/p left hip ORIF, ORIF of the left patella.   Skin:  Positive for wound. Negative for color change.       BLE discoloration, chronic venous insufficiency skin changes, lateral left lower leg slow healing ruptured hematoma. A quarter sized red, warmth, raised, fluid filled area on the previous left knee surgical incision-healed.    Neurological:  Negative for seizures, speech difficulty, weakness and headaches.       Memory lapses. Hx of seizures. RLS  Psychiatric/Behavioral:  Negative for behavioral problems, dysphoric mood and sleep disturbance. The patient is not nervous/anxious.     Immunization History  Administered Date(s) Administered   Influenza Split 01/06/2014, 01/15/2017, 01/08/2018, 12/09/2018   Influenza Whole 01/07/2012, 01/06/2013   Influenza, High Dose Seasonal PF 12/25/2015, 01/20/2017   Influenza,inj,Quad PF,6+ Mos 12/21/2014   Influenza-Unspecified 12/09/2018, 01/18/2020, 01/24/2021   Moderna SARS-COV2 Booster Vaccination 04/02/2021   Moderna Sars-Covid-2 Vaccination 04/12/2019, 06/05/2019, 02/15/2020,  09/05/2020   PFIZER(Purple Top)SARS-COV-2 Vaccination 12/26/2020   Pneumococcal Conjugate-13 02/01/2014   Pneumococcal Polysaccharide-23 12/20/1992, 01/15/2000, 04/08/2004, 06/08/2004   Td 04/08/2002, 04/22/2002   Tdap 04/09/2011, 08/17/2015   Zoster  Recombinat (Shingrix) 05/29/2005, 08/27/2017   Zoster, Live 04/08/2008, 05/29/2014, 05/28/2017   Zoster, Unspecified 05/29/2005   Pertinent  Health Maintenance Due  Topic Date Due   INFLUENZA VACCINE  11/06/2021   DEXA SCAN  Completed   MAMMOGRAM  Discontinued      09/07/2021    9:30 PM 09/08/2021   12:40 PM 11/04/2021   10:37 AM 11/04/2021   11:24 PM 11/05/2021   10:59 AM  Fall Risk  Patient Fall Risk Level High fall risk High fall risk Moderate fall risk High fall risk High fall risk   Functional Status Survey:    Vitals:   02/04/22 1055  BP: 110/60  Pulse: 80  Resp: 20  Temp: 98 F (36.7 C)  SpO2: 94%  Weight: 142 lb 11.2 oz (64.7 kg)   Body mass index is 26.1 kg/m. Physical Exam Vitals and nursing note reviewed.  Constitutional:      Appearance: Normal appearance.  HENT:     Head: Normocephalic and atraumatic.     Nose: Congestion present. No rhinorrhea.     Mouth/Throat:     Mouth: Mucous membranes are moist.  Eyes:     Extraocular Movements: Extraocular movements intact.     Conjunctiva/sclera: Conjunctivae normal.     Right eye: Right conjunctiva is not injected.     Left eye: Left conjunctiva is not injected.     Pupils: Pupils are equal, round, and reactive to light.  Neck:     Comments: Left adam's apple bony aspect, sometimes feels soreness on palpation is chronic, no noted lymph nodes  Cardiovascular:     Rate and Rhythm: Normal rate and regular rhythm.     Heart sounds: Murmur heard.     Comments: PD pulses are not felt from previous examination.  Pulmonary:     Effort: Pulmonary effort is normal.     Breath sounds: No rales.     Comments: Decreased air entry to both lungs.  Abdominal:     General:  Bowel sounds are normal.     Palpations: Abdomen is soft.     Tenderness: There is no abdominal tenderness. There is no right CVA tenderness, left CVA tenderness, guarding or rebound.     Comments: Mid abd surgical scar  Genitourinary:    Vagina: No vaginal discharge.     Comments: No urogenital rash, drainage, or irritation.  Musculoskeletal:     Cervical back: Normal range of motion and neck supple.     Right lower leg: Edema present.     Left lower leg: Edema present.     Comments: Decreased overhead ROM of the left shoulder. Left knee s/p ORIF of the patella fx. S/p left hip ORIF LLE 2+, RLE 1+  Skin:    General: Skin is warm and dry.     Comments: BLE discoloration, chronic venous insufficiency skin changes, lateral left lower leg slow healing previous ruptured hematoma. A quarter sized red, warmth, raised, fluid filled area on the previous left knee surgical incision-resolved   Neurological:     General: No focal deficit present.     Mental Status: She is alert. Mental status is at baseline.     Gait: Gait abnormal.  Psychiatric:     Comments: Smiled, conversed appropriately during my visit today.      Labs reviewed: Recent Labs    09/02/21 0359 09/04/21 1308 09/06/21 0449 11/04/21 1047 11/13/21 0000 12/11/21 0000 12/13/21 0000  NA 138 144   < > 145 144 142  144  K 3.2* 3.9   < > 3.2* 3.7 3.0* 3.3*  CL 104 108   < > 109 103 107 109*  CO2 27 26   < > 27 28* 27* 28*  GLUCOSE 98 102*  --  80  --   --   --   BUN 21 31*   < > 16 28* 10 11  CREATININE 0.78 0.88   < > 0.69 0.7 0.6 0.6  CALCIUM 8.5* 8.8*   < > 9.6 9.3 8.2* 8.5*  MG 2.2  --   --   --   --   --   --    < > = values in this interval not displayed.   Recent Labs    11/02/21 0000 11/04/21 1047 12/11/21 0000  AST 19 20 12*  ALT '11 12 8  '$ ALKPHOS 160* 157* 137*  BILITOT  --  0.5  --   PROT  --  7.0  --   ALBUMIN 3.7 3.6 3.0*   Recent Labs    09/02/21 0359 09/04/21 1308 09/11/21 0000 11/02/21 0000  11/04/21 1047 12/11/21 0000  WBC 7.8 7.7   < > 4.3 4.7 4.0  NEUTROABS  --   --    < > 2,275.00 3.2 2,352.00  HGB 9.4* 10.0*   < > 11.6* 12.9 11.7*  HCT 30.6* 32.5*   < > 36 41.6 37  MCV 85.2 86.4  --   --  83.7  --   PLT 190 239   < > 243 252 179   < > = values in this interval not displayed.   Lab Results  Component Value Date   TSH 1.89 05/22/2021   No results found for: "HGBA1C" Lab Results  Component Value Date   CHOL 147 09/27/2021   HDL 48 09/27/2021   LDLCALC 78 09/27/2021   TRIG 131 09/27/2021   CHOLHDL 3.6 06/27/2015    Significant Diagnostic Results in last 30 days:  No results found.  Assessment/Plan  Dysuria Burning sensation upon urination and increased urinary frequency, urine contains mucous.  Denied abd or lower back pain, she is afebrile and in her usual sate of health.  Urine culture still pending due to recollection 02/11/22 empirical treat with Cipro '500mg'$  bid x 7 days in setting of [facility] and patient has developed generalized weakness and poor appetite.   Hypokalemia supplemented, K 3.7 12/20/21  Edema BLE, chronic, mild edema, venous insufficiency skin changes, on Furosemide.   Adult failure to thrive stable, prn Lorazepam used a few times, no Haldol or Morphine needed.   Suprapubic catheter [facility]) Urology 01/09/22 catheter plug, goal if to remove SPC or replacement  Major depressive disorder, recurrent (HCC)  stable, no s/s of psychosis, prn Lorazepam is adequate   Senile dementia ([place]) under hospice service  Restless legs syndrome (RLS)  stable, taking MiraPex.   Seizure disorder ([place]) no active seizures, off meds.   COPD (chronic obstructive pulmonary disease) (HCC) Stable, on ProAir  Allergic rhinitis Flonase nasal spray daily R+L nostril.    Family/ staff Communication: plan of care reviewed with the pati  Labs/tests ordered:  none  Time spend 25 minutes

## 2022-02-04 NOTE — Assessment & Plan Note (Signed)
supplemented, K 3.7 12/20/21

## 2022-02-04 NOTE — Assessment & Plan Note (Signed)
Urology 01/09/22 catheter plug, goal if to remove [facility] or replacement

## 2022-02-04 NOTE — Assessment & Plan Note (Signed)
stable, no s/s of psychosis, prn Lorazepam is adequate

## 2022-02-04 NOTE — Assessment & Plan Note (Signed)
stable, taking MiraPex.  

## 2022-02-04 NOTE — Assessment & Plan Note (Addendum)
Burning sensation upon urination and increased urinary frequency, urine contains mucous.  Denied abd or lower back pain, she is afebrile and in her usual sate of health.  Urine culture still pending due to recollection 02/11/22 empirical treat with Cipro '500mg'$  bid x 7 days in setting of [facility] and patient has developed generalized weakness and poor appetite.

## 2022-02-04 NOTE — Assessment & Plan Note (Signed)
no active seizures, off meds.

## 2022-02-04 NOTE — Assessment & Plan Note (Signed)
Flonase nasal spray daily R+L nostril.

## 2022-02-04 NOTE — Assessment & Plan Note (Signed)
stable, prn Lorazepam used a few times, no Haldol or Morphine needed.

## 2022-02-04 NOTE — Assessment & Plan Note (Signed)
Stable, on ProAir

## 2022-02-04 NOTE — Assessment & Plan Note (Signed)
BLE, chronic, mild edema, venous insufficiency skin changes, on Furosemide.

## 2022-02-04 NOTE — Assessment & Plan Note (Signed)
under hospice service

## 2022-02-08 NOTE — Addendum Note (Signed)
Addended by: Dorothea Ogle X on: 02/08/2022 03:35 PM   Modules accepted: Level of Service

## 2022-02-12 ENCOUNTER — Emergency Department (HOSPITAL_COMMUNITY)

## 2022-02-12 ENCOUNTER — Non-Acute Institutional Stay (SKILLED_NURSING_FACILITY): Payer: Medicare PPO | Admitting: Nurse Practitioner

## 2022-02-12 ENCOUNTER — Other Ambulatory Visit: Payer: Self-pay

## 2022-02-12 ENCOUNTER — Encounter: Payer: Self-pay | Admitting: Nurse Practitioner

## 2022-02-12 ENCOUNTER — Inpatient Hospital Stay (HOSPITAL_COMMUNITY)
Admission: EM | Admit: 2022-02-12 | Discharge: 2022-02-16 | DRG: 660 | Disposition: A | Discharge status: Skilled Nursing Facility | Source: Skilled Nursing Facility | Attending: Internal Medicine | Admitting: Internal Medicine

## 2022-02-12 ENCOUNTER — Encounter (HOSPITAL_COMMUNITY): Payer: Self-pay | Admitting: Family Medicine

## 2022-02-12 DIAGNOSIS — Z9359 Other cystostomy status: Secondary | ICD-10-CM | POA: Diagnosis not present

## 2022-02-12 DIAGNOSIS — J449 Chronic obstructive pulmonary disease, unspecified: Secondary | ICD-10-CM | POA: Diagnosis not present

## 2022-02-12 DIAGNOSIS — Z66 Do not resuscitate: Secondary | ICD-10-CM | POA: Diagnosis present

## 2022-02-12 DIAGNOSIS — I739 Peripheral vascular disease, unspecified: Secondary | ICD-10-CM | POA: Diagnosis present

## 2022-02-12 DIAGNOSIS — J431 Panlobular emphysema: Secondary | ICD-10-CM | POA: Diagnosis present

## 2022-02-12 DIAGNOSIS — G40909 Epilepsy, unspecified, not intractable, without status epilepticus: Secondary | ICD-10-CM

## 2022-02-12 DIAGNOSIS — I471 Supraventricular tachycardia, unspecified: Secondary | ICD-10-CM

## 2022-02-12 DIAGNOSIS — I2510 Atherosclerotic heart disease of native coronary artery without angina pectoris: Secondary | ICD-10-CM | POA: Diagnosis not present

## 2022-02-12 DIAGNOSIS — R4689 Other symptoms and signs involving appearance and behavior: Secondary | ICD-10-CM

## 2022-02-12 DIAGNOSIS — R300 Dysuria: Secondary | ICD-10-CM | POA: Diagnosis not present

## 2022-02-12 DIAGNOSIS — Z87891 Personal history of nicotine dependence: Secondary | ICD-10-CM | POA: Diagnosis not present

## 2022-02-12 DIAGNOSIS — G2581 Restless legs syndrome: Secondary | ICD-10-CM | POA: Diagnosis present

## 2022-02-12 DIAGNOSIS — Z79899 Other long term (current) drug therapy: Secondary | ICD-10-CM | POA: Diagnosis not present

## 2022-02-12 DIAGNOSIS — N111 Chronic obstructive pyelonephritis: Secondary | ICD-10-CM | POA: Diagnosis present

## 2022-02-12 DIAGNOSIS — N132 Hydronephrosis with renal and ureteral calculous obstruction: Secondary | ICD-10-CM | POA: Diagnosis not present

## 2022-02-12 DIAGNOSIS — N136 Pyonephrosis: Secondary | ICD-10-CM | POA: Diagnosis not present

## 2022-02-12 DIAGNOSIS — Z9049 Acquired absence of other specified parts of digestive tract: Secondary | ICD-10-CM

## 2022-02-12 DIAGNOSIS — R609 Edema, unspecified: Secondary | ICD-10-CM

## 2022-02-12 DIAGNOSIS — Z96612 Presence of left artificial shoulder joint: Secondary | ICD-10-CM | POA: Diagnosis present

## 2022-02-12 DIAGNOSIS — M810 Age-related osteoporosis without current pathological fracture: Secondary | ICD-10-CM | POA: Diagnosis present

## 2022-02-12 DIAGNOSIS — R627 Adult failure to thrive: Secondary | ICD-10-CM

## 2022-02-12 DIAGNOSIS — Z20822 Contact with and (suspected) exposure to covid-19: Secondary | ICD-10-CM | POA: Diagnosis present

## 2022-02-12 DIAGNOSIS — E876 Hypokalemia: Secondary | ICD-10-CM | POA: Diagnosis not present

## 2022-02-12 DIAGNOSIS — Z8249 Family history of ischemic heart disease and other diseases of the circulatory system: Secondary | ICD-10-CM

## 2022-02-12 DIAGNOSIS — Z90710 Acquired absence of both cervix and uterus: Secondary | ICD-10-CM

## 2022-02-12 DIAGNOSIS — R569 Unspecified convulsions: Secondary | ICD-10-CM | POA: Diagnosis not present

## 2022-02-12 DIAGNOSIS — J438 Other emphysema: Secondary | ICD-10-CM | POA: Diagnosis present

## 2022-02-12 DIAGNOSIS — E785 Hyperlipidemia, unspecified: Secondary | ICD-10-CM | POA: Diagnosis present

## 2022-02-12 DIAGNOSIS — I10 Essential (primary) hypertension: Secondary | ICD-10-CM | POA: Diagnosis present

## 2022-02-12 DIAGNOSIS — R4189 Other symptoms and signs involving cognitive functions and awareness: Secondary | ICD-10-CM

## 2022-02-12 DIAGNOSIS — F0390 Unspecified dementia without behavioral disturbance: Secondary | ICD-10-CM | POA: Diagnosis present

## 2022-02-12 DIAGNOSIS — D72829 Elevated white blood cell count, unspecified: Secondary | ICD-10-CM

## 2022-02-12 DIAGNOSIS — Z803 Family history of malignant neoplasm of breast: Secondary | ICD-10-CM | POA: Diagnosis not present

## 2022-02-12 LAB — BASIC METABOLIC PANEL
Anion gap: 7 (ref 5–15)
BUN: 37 mg/dL — ABNORMAL HIGH (ref 8–23)
CO2: 21 mmol/L — ABNORMAL LOW (ref 22–32)
Calcium: 8.7 mg/dL — ABNORMAL LOW (ref 8.9–10.3)
Chloride: 109 mmol/L (ref 98–111)
Creatinine, Ser: 0.99 mg/dL (ref 0.44–1.00)
GFR, Estimated: 56 mL/min — ABNORMAL LOW (ref >60)
Glucose, Bld: 114 mg/dL — ABNORMAL HIGH (ref 70–99)
Potassium: 3.7 mmol/L (ref 3.5–5.1)
Sodium: 137 mmol/L (ref 135–145)

## 2022-02-12 LAB — CBC
HCT: 41.3 % (ref 36.0–46.0)
Hemoglobin: 12.8 g/dL (ref 12.0–15.0)
MCH: 26.4 pg (ref 26.0–34.0)
MCHC: 31 g/dL (ref 30.0–36.0)
MCV: 85.3 fL (ref 80.0–100.0)
Platelets: 241 10*3/uL (ref 150–400)
RBC: 4.84 MIL/uL (ref 3.87–5.11)
RDW: 16.9 % — ABNORMAL HIGH (ref 11.5–15.5)
WBC: 19.1 10*3/uL — ABNORMAL HIGH (ref 4.0–10.5)
nRBC: 0 % (ref 0.0–0.2)

## 2022-02-12 LAB — URINALYSIS, ROUTINE W REFLEX MICROSCOPIC
Glucose, UA: 100 mg/dL — AB
Ketones, ur: NEGATIVE mg/dL
Nitrite: POSITIVE — AB
Protein, ur: 100 mg/dL — AB
Specific Gravity, Urine: 1.025 (ref 1.005–1.030)
pH: 6 (ref 5.0–8.0)

## 2022-02-12 LAB — URINALYSIS, MICROSCOPIC (REFLEX): WBC, UA: >50 WBC/hpf (ref 0–5)

## 2022-02-12 LAB — RESP PANEL BY RT-PCR (FLU A&B, COVID) ARPGX2
Influenza A by PCR: NEGATIVE
Influenza B by PCR: NEGATIVE
SARS Coronavirus 2 by RT PCR: NEGATIVE

## 2022-02-12 MED ORDER — KETOROLAC TROMETHAMINE 15 MG/ML IJ SOLN
15.0000 mg | Freq: Once | INTRAMUSCULAR | Status: AC
  Administered 2022-02-12: 15 mg via INTRAVENOUS
  Filled 2022-02-12: qty 1

## 2022-02-12 MED ORDER — MORPHINE SULFATE (PF) 2 MG/ML IV SOLN
2.0000 mg | Freq: Once | INTRAVENOUS | Status: AC
  Administered 2022-02-12: 2 mg via INTRAVENOUS
  Filled 2022-02-12: qty 1

## 2022-02-12 MED ORDER — SODIUM CHLORIDE 0.9 % IV SOLN
1.0000 g | Freq: Once | INTRAVENOUS | Status: AC
  Administered 2022-02-12: 1 g via INTRAVENOUS
  Filled 2022-02-12: qty 10

## 2022-02-12 MED ORDER — PRAMIPEXOLE DIHYDROCHLORIDE 0.25 MG PO TABS
0.5000 mg | ORAL_TABLET | Freq: Every day | ORAL | Status: DC
  Administered 2022-02-12 – 2022-02-15 (×4): 0.5 mg via ORAL
  Filled 2022-02-12 (×4): qty 2

## 2022-02-12 MED ORDER — ACETAMINOPHEN 325 MG PO TABS
650.0000 mg | ORAL_TABLET | Freq: Four times a day (QID) | ORAL | Status: DC | PRN
  Administered 2022-02-15: 650 mg via ORAL
  Filled 2022-02-12: qty 2

## 2022-02-12 MED ORDER — SODIUM CHLORIDE 0.9 % IV SOLN: 1.0000 g | INTRAVENOUS | Status: DC

## 2022-02-12 MED ORDER — IOHEXOL 300 MG/ML  SOLN
100.0000 mL | Freq: Once | INTRAMUSCULAR | Status: AC | PRN
  Administered 2022-02-12: 100 mL via INTRAVENOUS

## 2022-02-12 MED ORDER — ACETAMINOPHEN 650 MG RE SUPP: 650.0000 mg | Freq: Four times a day (QID) | RECTAL | Status: DC | PRN

## 2022-02-12 MED ORDER — ALBUTEROL SULFATE 108 (90 BASE) MCG/ACT IN AEPB: 2.0000 | INHALATION_SPRAY | RESPIRATORY_TRACT | Status: DC

## 2022-02-12 MED ORDER — ALBUTEROL SULFATE (2.5 MG/3ML) 0.083% IN NEBU: 2.5000 mg | INHALATION_SOLUTION | Freq: Every day | RESPIRATORY_TRACT | Status: DC | PRN

## 2022-02-12 MED ORDER — SODIUM CHLORIDE 0.9 % IV BOLUS
1000.0000 mL | Freq: Once | INTRAVENOUS | Status: AC
  Administered 2022-02-12: 1000 mL via INTRAVENOUS

## 2022-02-12 MED ORDER — SODIUM CHLORIDE 0.9 % IV SOLN: INTRAVENOUS | Status: DC

## 2022-02-12 MED ORDER — ONDANSETRON HCL 4 MG/2ML IJ SOLN
4.0000 mg | Freq: Four times a day (QID) | INTRAMUSCULAR | Status: DC | PRN
  Administered 2022-02-15: 4 mg via INTRAVENOUS
  Filled 2022-02-12: qty 2

## 2022-02-12 MED ORDER — SACCHAROMYCES BOULARDII 250 MG PO CAPS
250.0000 mg | ORAL_CAPSULE | Freq: Two times a day (BID) | ORAL | Status: DC
  Administered 2022-02-12 – 2022-02-16 (×8): 250 mg via ORAL
  Filled 2022-02-12 (×9): qty 1

## 2022-02-12 MED ORDER — FENTANYL CITRATE PF 50 MCG/ML IJ SOSY: 12.5000 ug | PREFILLED_SYRINGE | INTRAMUSCULAR | Status: DC | PRN

## 2022-02-12 MED ORDER — ONDANSETRON HCL 4 MG PO TABS: 4.0000 mg | ORAL_TABLET | Freq: Four times a day (QID) | ORAL | Status: DC | PRN

## 2022-02-12 MED ORDER — SODIUM CHLORIDE 0.9% FLUSH
3.0000 mL | Freq: Two times a day (BID) | INTRAVENOUS | Status: DC
  Administered 2022-02-13 – 2022-02-16 (×5): 3 mL via INTRAVENOUS

## 2022-02-12 NOTE — Assessment & Plan Note (Signed)
supplemented, K 3.7 12/20/21

## 2022-02-12 NOTE — Assessment & Plan Note (Signed)
no active seizures, off meds.

## 2022-02-12 NOTE — Assessment & Plan Note (Signed)
prn Lorazepam used a few times, no Haldol or Morphine needed.

## 2022-02-12 NOTE — Consult Note (Signed)
Reason for Consult:Right Ureteral Stone, Incomplete Bladder Emptying / Urethral Stricture / Chronic SP Tube  Referring Physician: Godfrey Pick MD  [name] is an 88 y.o. female.   HPI:   1 - Right Ureteral Stone - 9m Rt prox ureteral sotne with mild hydro, few scattered small Rt renal stones by ER CT 02/12/22 on eval Rt flank pain. Some luekocytosis and mildy tachycardia that improved with IVF. UA with some bacteruria as expected with SP Tube. Placed on empiric rocephin. No prior stones. Cr <1.   2 - Incomplete Bladder Emptying / Urethral Stricture / Chronic SP Tube - s/p SP Tube palcement 10/2021 for retention and partial urethral stricture. Now prefers for QOL.  PMH sig for seizures, COPD (no O2), failure to thrive. No chest/abd surgeries. She is DNR but makes all her own decisions, lives in skilled nursing section of [facility]. [facility]. 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 She does have some leukocytosis as well. No high grade fever. No UCX data from [facility] office.   Past Medical History:  Diagnosis Date   Acute bronchitis 05/23/2011   Acute upper respiratory infections of unspecified site 05/23/2011   Arthritis    Chronic airway obstruction, not elsewhere classified 05/23/2011   Disturbance of salivary secretion 01/31/2011   Dizziness and giddiness 01/31/2011   Dyspnea    Essential tremor 04/25/2014   External hemorrhoids without mention of complication [id number]/62/7035  Gait disorder 04/25/2014   Insomnia, unspecified 09/12/2011   Lumbago 01/31/2011   Major depressive disorder, single episode, unspecified 01/31/2011   Memory disorder 04/25/2014   Mitral valve disorders(424.0) 01/31/2011   Other and unspecified hyperlipidemia 01/31/2011   Other convulsions 01/31/2011   Other emphysema (HKeeler 01/31/2011   Pain in joint, site  unspecified 01/31/2011   Restless legs syndrome (RLS) 09/12/2011   Retinal detachment with retinal defect of right eye 2011   right eye twice   Seizure disorder ([place]    Senile osteoporosis 01/31/2011   Spontaneous ecchymoses 01/31/2011   Stiffness of joints, not elsewhere classified, multiple sites 01/31/2011   Unspecified essential hypertension 01/31/2011    Past Surgical History:  Procedure Laterality Date   ABDOMINAL HYSTERECTOMY  06/21/2003   TAH/BSO, omenectomy PSB resect, Stg IC cystadenofibroma   CHOLECYSTECTOMY  2005   Dr. TMarlou Starks  ELBOW SURGERY Right 2008   broken   Dr. OApolonio Schneiders  EYE SURGERY     INTRAMEDULLARY (IM) NAIL INTERTROCHANTERIC Left 08/30/2021   Procedure: INTRAMEDULLARY (IM) NAIL INTERTROCHANTRIC;  Surgeon: SRod Can MD;  Location: [facility];  Service: Orthopedics;  Laterality: Left;   ORIF PATELLA Left 05/02/2020   Procedure: OPEN REDUCTION INTERNAL (ORIF) FIXATION LEFT PATELLA WITH MEDIAL AND LATERAL LIGAMENT REINFORCEMENTS;  Surgeon: MRenette Butters MD;  Location: [facility];  Service: Orthopedics;  Laterality: Left;   ORIF PATELLA Left 05/30/2020   Procedure: OPEN REDUCTION INTERNAL (ORIF) FIXATION PATELLA;  Surgeon: MRenette Butters MD;  Location: [facility];  Service: Orthopedics;  Laterality: Left;   RETINAL DETACHMENT SURGERY N/A    two   REVERSE SHOULDER ARTHROPLASTY Left 05/06/2019   Procedure: REVERSE SHOULDER ARTHROPLASTY;  Surgeon: SJustice Britain MD;  Location: [facility];  Service: Orthopedics;  Laterality: Left;  1264m   ROTATOR CUFF REPAIR Right 2012   Dr. CoTheda Sers SQUAMOUS CELL CARCINOMA EXCISION Bilateral 2012, 8/14  [place] on legs   Dr. Sarajane Jews   TONSILLECTOMY  1941   VIDEO BRONCHOSCOPY WITH ENDOBRONCHIAL NAVIGATION N/A 11/29/2015   Procedure: VIDEO BRONCHOSCOPY WITH ENDOBRONCHIAL NAVIGATION;  Surgeon: Collene Gobble, MD;  Location: [facility];  Service: Thoracic;  Laterality: N/A;    Family History  Problem Relation Age of Onset   Heart disease Father         CHF   Cancer Mother        breast   Seizures Sister     Social History:  reports that she quit smoking about 33 years ago. Her smoking use included cigarettes. She has a 40.00 pack-year smoking history. She has never used smokeless tobacco. She reports that she does not drink alcohol and does not use drugs.  Allergies:  Allergies  Allergen Reactions   Vioxx [Rofecoxib] Shortness Of Breath   Dyazide [Hydrochlorothiazide W-Triamterene] Other (See Comments)    Lowers blood pressure too much   Latex Swelling and Other (See Comments)    "Allergic," per MAR   Sulfa Antibiotics Nausea And Vomiting and Other (See Comments)    "Allergic," per [facility]   Sumycin [Tetracycline] Other (See Comments)    Can't take due to drug reaction- "Allergic," per MAR    Medications: I have reviewed the patient's current medications.  Results for orders placed or performed during the hospital encounter of 02/12/22 (from the past 48 hour(s))  CBC     Status: Abnormal   Collection Time: 02/12/22  6:31 PM  Result Value Ref Range   WBC 19.1 (H) 4.0 - 10.5 K/uL   RBC 4.84 3.87 - 5.11 MIL/uL   Hemoglobin 12.8 12.0 - 15.0 g/dL   HCT 41.3 36.0 - 46.0 %   MCV 85.3 80.0 - 100.0 fL   MCH 26.4 26.0 - 34.0 pg   MCHC 31.0 30.0 - 36.0 g/dL   RDW 16.9 (H) 11.5 - 15.5 %   Platelets 241 150 - 400 K/uL   nRBC 0.0 0.0 - 0.2 %    Comment: Performed at [facility], [place] [address], [place], [place] [address]  Basic metabolic panel     Status: Abnormal   Collection Time: 02/12/22  6:31 PM  Result Value Ref Range   Sodium 137 135 - 145 mmol/L   Potassium 3.7 3.5 - 5.1 mmol/L   Chloride 109 98 - 111 mmol/L   CO2 21 (L) 22 - 32 mmol/L   Glucose, Bld 114 (H) 70 - 99 mg/dL    Comment: Glucose reference range applies only to samples taken after fasting for at least 8 hours.   BUN 37 (H) 8 - 23 mg/dL   Creatinine, Ser 0.99 0.44 - 1.00 mg/dL   Calcium 8.7 (L) 8.9 - 10.3 mg/dL   GFR, Estimated 56 (L)  >60 mL/min    Comment: (NOTE) Calculated using the CKD-EPI Creatinine Equation (2021)    Anion gap 7 5 - 15    Comment: Performed at [facility], [place] [address] [place], [place] [address]  Urinalysis, Routine w reflex microscopic Urine, Catheterized     Status: Abnormal   Collection Time: 02/12/22  6:39 PM  Result Value Ref Range   Color, Urine YELLOW YELLOW   APPearance CLEAR CLEAR   Specific Gravity, Urine 1.025 1.005 - 1.030   pH 6.0 5.0 - 8.0   Glucose, UA 100 (A) NEGATIVE mg/dL   Hgb urine dipstick LARGE (A) NEGATIVE   Bilirubin Urine SMALL (A) NEGATIVE  Ketones, ur NEGATIVE NEGATIVE mg/dL   Protein, ur 100 (A) NEGATIVE mg/dL   Nitrite POSITIVE (A) NEGATIVE   Leukocytes,Ua MODERATE (A) NEGATIVE    Comment: Performed at [facility], [place] [address] [place], [place] [address]  Resp Panel by RT-PCR (Flu A&B, Covid) Anterior Nasal Swab     Status: None   Collection Time: 02/12/22  6:39 PM   Specimen: Anterior Nasal Swab  Result Value Ref Range   SARS Coronavirus 2 by RT PCR NEGATIVE NEGATIVE    Comment: (NOTE) SARS-CoV-2 target nucleic acids are NOT DETECTED.  The SARS-CoV-2 RNA is generally detectable in upper respiratory specimens during the acute phase of infection. The lowest concentration of SARS-CoV-2 viral copies this assay can detect is 138 copies/mL. A negative result does not preclude SARS-Cov-2 infection and should not be used as the sole basis for treatment or other patient management decisions. A negative result may occur with  improper specimen collection/handling, submission of specimen other than nasopharyngeal swab, presence of viral mutation(s) within the areas targeted by this assay, and inadequate number of viral copies(<138 copies/mL). A negative result must be combined with clinical observations, patient history, and epidemiological information. The expected result is Negative.  Fact Sheet for Patients:   EntrepreneurPulse.com.au  Fact Sheet for Healthcare Providers:  IncredibleEmployment.be  This test is no t yet approved or cleared by the [place] FDA and  has been authorized for detection and/or diagnosis of SARS-CoV-2 by FDA under an Emergency Use Authorization (EUA). This EUA will remain  in effect (meaning this test can be used) for the duration of the COVID-19 declaration under Section 564(b)(1) of the Act, 21 U.S.C.section 360bbb-3(b)(1), unless the authorization is terminated  or revoked sooner.       Influenza A by PCR NEGATIVE NEGATIVE   Influenza B by PCR NEGATIVE NEGATIVE    Comment: (NOTE) The Xpert Xpress SARS-CoV-2/FLU/RSV plus assay is intended as an aid in the diagnosis of influenza from Nasopharyngeal swab specimens and should not be used as a sole basis for treatment. Nasal washings and aspirates are unacceptable for Xpert Xpress SARS-CoV-2/FLU/RSV testing.  Fact Sheet for Patients: EntrepreneurPulse.com.au  Fact Sheet for Healthcare Providers: IncredibleEmployment.be  This test is not yet approved or cleared by the [place] FDA and has been authorized for detection and/or diagnosis of SARS-CoV-2 by FDA under an Emergency Use Authorization (EUA). This EUA will remain in effect (meaning this test can be used) for the duration of the COVID-19 declaration under Section 564(b)(1) of the Act, 21 U.S.C. section 360bbb-3(b)(1), unless the authorization is terminated or revoked.  Performed at [facility], [place] [address], [place], [place] [address]   Urinalysis, Microscopic (reflex)     Status: Abnormal   Collection Time: 02/12/22  6:39 PM  Result Value Ref Range   RBC / HPF 0-5 0 - 5 RBC/hpf   WBC, UA >50 0 - 5 WBC/hpf   Bacteria, UA FEW (A) NONE SEEN   Squamous Epithelial / LPF 0-5 0 - 5    Comment: Performed at [facility], [place]  [address] [place],  [address]    CT ABDOMEN PELVIS W CONTRAST  Result Date: 02/12/2022 CLINICAL DATA:  Acute abdominal pain. Back pain. Diagnosed with urinary tract infection yesterday. EXAM: CT ABDOMEN AND PELVIS WITH CONTRAST TECHNIQUE: Multidetector CT imaging of the abdomen and pelvis was performed using the standard protocol following bolus administration of intravenous contrast. RADIATION DOSE REDUCTION: This exam was performed according  to the departmental dose-optimization program which includes automated exposure control, adjustment of the mA and/or kV according to patient size and/or use of iterative reconstruction technique. CONTRAST:  156m OMNIPAQUE IOHEXOL 300 MG/ML  SOLN COMPARISON:  Abdominopelvic CT 10/20/2015. FINDINGS: Lower chest: Emphysema. The thoracic aorta is tortuous. The previous pleural based left lower lobe nodule is obscured by atelectasis and small pleural effusion on the current exam. There is also a small right pleural effusion and atelectasis. Hepatobiliary: There is 6 mm hypodensity in the inferior right hepatic lobe that is too small to accurately characterize. Ill-defined low-density in the central liver at site of hemangioma on prior CT. Clips in the gallbladder fossa postcholecystectomy. No biliary dilatation. Pancreas: Scattered punctate calcifications suggesting chronic pancreatitis. There is no acute pancreatic inflammation. No ductal dilatation or pancreatic mass. Spleen: Normal in size without focal abnormality. Adrenals/Urinary Tract: Normal adrenal glands. There is an obstructing 12 x 16 mm stone in the right proximal ureter with moderate hydronephrosis and perinephric edema. Mild enhancement of the right renal pelvis and proximal ureter. The ureter distal to this is decompressed. Additional intrarenal calculi in both kidneys. There is no left hydronephrosis. No suspicious renal lesion. Urinary bladder is decompressed by suprapubic catheter, small amount of  high-density material in the dependent bladder is of unknown significance, possible layering stones. Stomach/Bowel: Detailed bowel assessment is limited in the absence of enteric contrast. The stomach is nondistended. There is no evidence of small bowel obstruction or inflammation. Enteric sutures are noted within bowel loops in the pelvis. Moderate stool in the proximal colon, small volume of stool distally. Mild gaseous distension of transverse and proximal descending colon. No colonic inflammation. Vascular/Lymphatic: Moderate to advanced aortic atherosclerosis and tortuosity. No aneurysm. There is no bulky abdominopelvic adenopathy. Prominent portal caval node measuring 9 mm is not enlarged by size criteria. Reproductive: Status post hysterectomy. No adnexal masses. Other: Presacral soft tissue thickening edema. Right perirenal stranding. No free air or ascites. Musculoskeletal: Scoliosis and diffuse degenerative change in the spine. Chronic T12 compression deformity. Postsurgical change in the left proximal femur. No acute osseous findings. IMPRESSION: 1. Obstructing 12 x 16 mm stone in the right proximal ureter with moderate hydronephrosis and perinephric edema. Mild enhancement of the right renal pelvis and proximal ureter, may be related to renal obstruction or concurrent urinary tract infection. 2. Additional intrarenal calculi in both kidneys. 3. Suprapubic catheter in the urinary bladder. Small amount of high-density material in the dependent bladder is of unknown significance, possible layering stones. 4. Additional stable chronic findings as described. Aortic Atherosclerosis (ICD10-I70.0) and Emphysema (ICD10-J43.9). Electronically Signed   By: MKeith RakeM.D.   On: 02/12/2022 19:46   DG Chest 2 View  Result Date: 02/12/2022 CLINICAL DATA:  Chest pain EXAM: CHEST - 2 VIEW COMPARISON:  Chest x-ray dated October 05, 2021 FINDINGS: Low lung volumes. Cardiac and mediastinal contours are within normal  limits. Trace bilateral pleural effusions and left-greater-than-right PEs atelectasis no evidence of pneumothorax. Severe compression deformities of the lower thoracic spine, unchanged when compared with prior. IMPRESSION: Trace bilateral pleural effusions and left-greater-than-right basilar atelectasis. Electronically Signed   By: LYetta GlassmanM.D.   On: 02/12/2022 19:22    Review of Systems  Constitutional:  Negative for chills.  Genitourinary:  Positive for flank pain.  All other systems reviewed and are negative.  Blood pressure 116/86, pulse 95, temperature 99 F (37.2 C), temperature source Oral, resp. rate (!) 24, height '5\' 2"'$  (1.575 m), weight 65.8 kg, last  menstrual period 02/16/1975, SpO2 93 %. Physical Exam Vitals reviewed.  Constitutional:      Comments: Elderly. AOx3, very pleasant though very frail.   HENT:     Head: Normocephalic.  Eyes:     Pupils: Pupils are equal, round, and reactive to light.  Cardiovascular:     Rate and Rhythm: Normal rate.  Pulmonary:     Effort: Pulmonary effort is normal.  Abdominal:     General: Abdomen is flat.  Genitourinary:    Comments: Mild Rt CVAT at present.  Musculoskeletal:        General: Normal range of motion.     Cervical back: Normal range of motion.  Neurological:     General: No focal deficit present.     Mental Status: She is alert.  Psychiatric:        Mood and Affect: Mood normal.     Assessment/Plan:  Overall presentation concerning for very early obstructing pyelo. Discussed recommended path of renal decompression with stenting, continue IV ABX to clear infectious parameters (goals of this hospitalization) then ureteroscoyp in few weeks in outpatient setting as safest managmnet. She is in agreement. WE discusse dtypes of renal decompression and agree on attempt Rt ureteral stent. This may require urethral dilation of sig strictrue present or even neph tueb if angulation not possible from below. Riske, benefits,  alternatives, expected peri-op course discussed.   Agree with empiric rocephin for now.   NPO p MN for stent as start case in AM.   Alexis Frock 02/12/2022, 8:47 PM

## 2022-02-12 NOTE — Assessment & Plan Note (Signed)
02/11/22 empirical treat with Cipro 526m [facility] weakness and poor appetite.    lethargy, generalized weakness, poor appetite, urine culture pending, since 02/04/22 c/o burning sensation upon urination and increased urinary frequency, urine contains mucous. Stat CBC/diff, CMP/eGFR.

## 2022-02-12 NOTE — ED Triage Notes (Signed)
Patient BIB EMS from [facility] c/o back pain. Per report pt was given with PRN meds for back pain with no relief. Per report pt was diagnose with UTI yesterday and started with Antibiotic. Pt denies N/V/D. Pt hx of dementia.  CBG 118 BP 118/76 HR 100 RR 20 O2sat 99% on RA

## 2022-02-12 NOTE — Assessment & Plan Note (Signed)
no s/s of psychosis, prn Lorazepam is adequate. Dementia, under hospice service

## 2022-02-12 NOTE — H&P (Signed)
History and Physical    CAMILA MAITA WJX:[id number] DOB: 1933-03-29 DOA: 02/12/2022  PCP: [name], MD   Patient coming from: SNF   Chief Complaint: General weakness, back pain   HPI: [name] is a 88 y.o. female with medical history significant for dementia, emphysema, and seizure disorder no longer on antiepileptics, presenting to the emergency department with back pain.  Patient had been complaining of dysuria, had urinalysis at her nursing facility compatible with possible UTI, was started on ciprofloxacin yesterday, and urine was sent for culture.  She was complaining of back pain today, has had generalized weakness, and was noted to have a leukocytosis to 17,300.  She was directed to the ED for further evaluation of this.    ED Course: Upon arrival to the ED, patient is found to be afebrile and saturating mid 90s on room air with systolic blood pressure of 102 and greater.  She is in sinus tachycardia with rate 109, PAC, first-degree AV nodal block, and LAFB on EKG.  Blood work notable for WBC 19,100 and elevated BUN to creatinine ratio.  CT of the abdomen and pelvis demonstrates obstructing stone in the right proximal ureter with moderate hydronephrosis and perinephric edema.  Urology was consulted by the ED PA, urine was sent for culture, and the patient was treated with Rocephin, IV fluids, morphine, and Toradol.  Review of Systems:  All other systems reviewed and apart from HPI, are negative.  Past Medical History:  Diagnosis Date   Acute bronchitis 05/23/2011   Acute upper respiratory infections of unspecified site 05/23/2011   Arthritis    Chronic airway obstruction, not elsewhere classified 05/23/2011   Disturbance of salivary secretion 01/31/2011   Dizziness and giddiness 01/31/2011   Dyspnea    Essential tremor 04/25/2014   External hemorrhoids without mention of complication 21/30/8657   Gait disorder 04/25/2014   Insomnia, unspecified 09/12/2011   Lumbago  01/31/2011   Major depressive disorder, single episode, unspecified 01/31/2011   Memory disorder 04/25/2014   Mitral valve disorders(424.0) 01/31/2011   Other and unspecified hyperlipidemia 01/31/2011   Other convulsions 01/31/2011   Other emphysema ([place]) 01/31/2011   Pain in joint, site unspecified 01/31/2011   Restless legs syndrome (RLS) 09/12/2011   Retinal detachment with retinal defect of right eye 2011   right eye twice   Seizure disorder ([place])    Senile osteoporosis 01/31/2011   Spontaneous ecchymoses 01/31/2011   Stiffness of joints, not elsewhere classified, multiple sites 01/31/2011   Unspecified essential hypertension 01/31/2011    Past Surgical History:  Procedure Laterality Date   ABDOMINAL HYSTERECTOMY  06/21/2003   TAH/BSO, omenectomy PSB resect, Stg IC cystadenofibroma   CHOLECYSTECTOMY  2005   Dr. Marlou Starks   ELBOW SURGERY Right 2008   broken   Dr. Apolonio Schneiders   EYE SURGERY     INTRAMEDULLARY (IM) NAIL INTERTROCHANTERIC Left 08/30/2021   Procedure: INTRAMEDULLARY (IM) NAIL INTERTROCHANTRIC;  Surgeon: Rod Can, MD;  Location: [facility];  Service: Orthopedics;  Laterality: Left;   ORIF PATELLA Left 05/02/2020   Procedure: OPEN REDUCTION INTERNAL (ORIF) FIXATION LEFT PATELLA WITH MEDIAL AND LATERAL LIGAMENT REINFORCEMENTS;  Surgeon: [name], MD;  Location: [facility];  Service: Orthopedics;  Laterality: Left;   ORIF PATELLA Left 05/30/2020   Procedure: OPEN REDUCTION INTERNAL (ORIF) FIXATION PATELLA;  Surgeon: [name], MD;  Location: [facility];  Service: Orthopedics;  Laterality: Left;   RETINAL DETACHMENT SURGERY N/A    two   REVERSE SHOULDER  ARTHROPLASTY Left 05/06/2019   Procedure: REVERSE SHOULDER ARTHROPLASTY;  Surgeon: Justice Britain, MD;  Location: [facility];  Service: Orthopedics;  Laterality: Left;  159mn   ROTATOR CUFF REPAIR Right 2012   Dr. CTheda Sers  SQUAMOUS CELL CARCINOMA EXCISION Bilateral 2012, 8/14   [place] on legs   Dr. GSarajane Jews  TONSILLECTOMY   1941   VIDEO BRONCHOSCOPY WITH ENDOBRONCHIAL NAVIGATION N/A 11/29/2015   Procedure: VIDEO BRONCHOSCOPY WITH ENDOBRONCHIAL NAVIGATION;  Surgeon: RCollene Gobble MD;  [place] [facility]  Service: Thoracic;  Laterality: N/A;    Social History:   reports that she quit smoking about 33 years ago. Her smoking use included cigarettes. She has a 40.00 pack-year smoking history. She has never used smokeless tobacco. She reports that she does not drink alcohol and does not use drugs.  Allergies  Allergen Reactions   Vioxx [Rofecoxib] Shortness Of Breath   Dyazide [Hydrochlorothiazide W-Triamterene] Other (See Comments)    Lowers blood pressure too much   Latex Swelling and Other (See Comments)    "Allergic," per [facility]   Sulfa Antibiotics Nausea And Vomiting and Other (See Comments)    [facility]  Sumycin [Tetracycline] Other (See Comments)    Can't take due to drug reaction- "[facility]   Family History  Problem Relation Age of Onset   Heart disease Father        CHF   Cancer Mother        breast   Seizures Sister      Prior to Admission medications   Medication Sig Start Date End Date Taking? Authorizing Provider  acetaminophen (TYLENOL) 325 MG tablet Take 650 mg by mouth every 6 (six) hours as needed for fever or mild pain.   Yes [provider]  Albuterol Sulfate (PROAIR RESPICLICK) 1892(90 Base) MCG/ACT AEPB Inhale 2 puffs into the lungs See admin instructions. Inhale 2 puffs into the lungs every morning as needed for shortness of breath or wheezing   Yes [provider]  ciprofloxacin (CIPRO) 500 MG tablet Take 500 mg by mouth 2 (two) times daily. 02/11/22  Yes [provider]  fluticasone (VERAMYST) 27.5 MCG/SPRAY nasal spray Place 1 spray into the nose daily.   Yes [provider]  hydrocortisone 2.5 % lotion Apply 1 application  topically 2 (two) times daily as needed (to scaly areas of face and ears).   Yes [provider]   hydrocortisone cream 1 % Apply 1 application  topically 2 (two) times daily as needed for itching (to scaly areas of face and ears).   Yes [provider]  ketoconazole (NIZORAL) 2 % cream Apply 1 application  topically daily as needed (to affected areas for psoriasis).   Yes [provider]  ketoconazole (NIZORAL) 2 % shampoo Apply 1 application  topically See admin instructions. Shampoo on Mondays and Thursdays   Yes [provider]  nystatin (MYCOSTATIN/NYSTOP) powder Apply 1 application  topically 2 (two) times daily as needed (urogenital area).   Yes [provider]  pramipexole (MIRAPEX) 0.5 MG tablet Take 0.5 mg by mouth at bedtime.   Yes [provider]  saccharomyces boulardii (FLORASTOR) 250 MG capsule Take 250 mg by mouth 2 (two) times daily. Encounter for prophylactic measures, unspecified   Yes [provider]  triamcinolone (KENALOG) 0.1 % Apply 1 application. topically daily as needed (psoriasis).   Yes [provider]    Physical Exam: Vitals:   02/12/22 1800 02/12/22 2007  02/12/22 2130 02/12/22 2134  BP: (!) 124/100 116/86 109/73   Pulse: (!) 114 95 (!) 101   Resp: 16 (!) 24 14   Temp:    98.4 F (36.9 C)  TempSrc:    Oral  SpO2: 94% 93% 95%   Weight:      Height:        Constitutional: NAD, no pallor or diaphoresis   Eyes: PERTLA, lids and conjunctivae normal ENMT: Mucous membranes are moist. Posterior pharynx clear of any exudate or lesions.   Neck: supple, no masses  Respiratory:  no wheezing, no crackles. No accessory muscle use.  Cardiovascular: S1 & S2 heard, regular rate and rhythm. No JVD. Abdomen: No distension, soft. Bowel sounds active.  Musculoskeletal: no clubbing / cyanosis. No joint deformity upper and lower extremities.   Skin: no significant rashes, lesions, ulcers. Warm, dry, well-perfused. Neurologic: CN 2-12 grossly intact. Moving all extremities. Alert and oriented to person, place,  and situation.  Psychiatric: Calm. Cooperative.    Labs and Imaging on Admission: I have personally reviewed following labs and imaging studies  CBC: Recent Labs  Lab 02/12/22 1831  WBC 19.1*  HGB 12.8  HCT 41.3  MCV 85.3  PLT 242   Basic Metabolic Panel: Recent Labs  Lab 02/12/22 1831  NA 137  K 3.7  CL 109  CO2 21*  GLUCOSE 114*  BUN 37*  CREATININE 0.99  CALCIUM 8.7*   GFR: Estimated Creatinine Clearance: 36.3 mL/min (by C-G formula based on SCr of 0.99 mg/dL). Liver Function Tests: No results for input(s): "AST", "ALT", "ALKPHOS", "BILITOT", "PROT", "ALBUMIN" in the last 168 hours. No results for input(s): "LIPASE", "AMYLASE" in the last 168 hours. No results for input(s): "AMMONIA" in the last 168 hours. Coagulation Profile: No results for input(s): "INR", "PROTIME" in the last 168 hours. Cardiac Enzymes: No results for input(s): "CKTOTAL", "CKMB", "CKMBINDEX", "TROPONINI" in the last 168 hours. BNP (last 3 results) No results for input(s): "PROBNP" in the last 8760 hours. HbA1C: No results for input(s): "HGBA1C" in the last 72 hours. CBG: No results for input(s): "GLUCAP" in the last 168 hours. Lipid Profile: No results for input(s): "CHOL", "HDL", "LDLCALC", "TRIG", "CHOLHDL", "LDLDIRECT" in the last 72 hours. Thyroid Function Tests: No results for input(s): "TSH", "T4TOTAL", "FREET4", "T3FREE", "THYROIDAB" in the last 72 hours. Anemia Panel: No results for input(s): "VITAMINB12", "FOLATE", "FERRITIN", "TIBC", "IRON", "RETICCTPCT" in the last 72 hours. Urine analysis:    Component Value Date/Time   COLORURINE YELLOW 02/12/2022 1839   APPEARANCEUR CLEAR 02/12/2022 1839   LABSPEC 1.025 02/12/2022 1839   PHURINE 6.0 02/12/2022 1839   GLUCOSEU 100 (A) 02/12/2022 1839   HGBUR LARGE (A) 02/12/2022 1839   BILIRUBINUR SMALL (A) 02/12/2022 1839   BILIRUBINUR neg 06/08/2013 [place] 02/12/2022 1839   PROTEINUR 100 (A) 02/12/2022 1839    UROBILINOGEN negative 06/08/2013 1019   UROBILINOGEN 0.2 08/16/2009 1549   NITRITE POSITIVE (A) 02/12/2022 1839   LEUKOCYTESUR MODERATE (A) 02/12/2022 1839   Sepsis Labs: '@LABRCNTIP'$ (procalcitonin:4,lacticidven:4) ) Recent Results (from the past 240 hour(s))  Resp Panel by RT-PCR (Flu A&B, Covid) Anterior Nasal Swab     Status: None   Collection Time: 02/12/22  6:39 PM   Specimen: Anterior Nasal Swab  Result Value Ref Range Status   SARS Coronavirus 2 by RT PCR NEGATIVE NEGATIVE Final    Comment: (NOTE) SARS-CoV-2 target nucleic acids are NOT DETECTED.  The SARS-CoV-2 RNA is generally detectable in upper respiratory specimens during  the acute phase of infection. The lowest concentration of SARS-CoV-2 viral copies this assay can detect is 138 copies/mL. A negative result does not preclude SARS-Cov-2 infection and should not be used as the sole basis for treatment or other patient management decisions. A negative result may occur with  improper specimen collection/handling, submission of specimen other than nasopharyngeal swab, presence of viral mutation(s) within the areas targeted by this assay, and inadequate number of viral copies(<138 copies/mL). A negative result must be combined with clinical observations, patient history, and epidemiological information. The expected result is Negative.  Fact Sheet for Patients:  EntrepreneurPulse.com.au  Fact Sheet for Healthcare Providers:  IncredibleEmployment.be  This test is no t yet approved or cleared by the [place] FDA and  has been authorized for detection and/or diagnosis of SARS-CoV-2 by FDA under an Emergency Use Authorization (EUA). This EUA will remain  in effect (meaning this test can be used) for the duration of the COVID-19 declaration under Section 564(b)(1) of the Act, 21 U.S.C.section 360bbb-3(b)(1), unless the authorization is terminated  or revoked sooner.        Influenza A by PCR NEGATIVE NEGATIVE Final   Influenza B by PCR NEGATIVE NEGATIVE Final    Comment: (NOTE) The Xpert Xpress SARS-CoV-2/FLU/RSV plus assay is intended as an aid in the diagnosis of influenza from Nasopharyngeal swab specimens and should not be used as a sole basis for treatment. Nasal washings and aspirates are unacceptable for Xpert Xpress SARS-CoV-2/FLU/RSV testing.  Fact Sheet for Patients: EntrepreneurPulse.com.au  Fact Sheet for Healthcare Providers: IncredibleEmployment.be  This test is not yet approved or cleared by the [place] FDA and has been authorized for detection and/or diagnosis of SARS-CoV-2 by FDA under an Emergency Use Authorization (EUA). This EUA will remain in effect (meaning this test can be used) for the duration of the COVID-19 declaration under Section 564(b)(1) of the Act, 21 U.S.C. section 360bbb-3(b)(1), unless the authorization is terminated or revoked.  Performed at [facility], [place] [address], [place], [place] [address]      Radiological Exams on Admission: CT L-SPINE NO CHARGE  Result Date: 02/12/2022 CLINICAL DATA:  Back pain EXAM: CT LUMBAR SPINE WITHOUT CONTRAST TECHNIQUE: Multidetector CT imaging of the lumbar spine was performed without intravenous contrast administration. Multiplanar CT image reconstructions were also generated. RADIATION DOSE REDUCTION: This exam was performed according to the departmental dose-optimization program which includes automated exposure control, adjustment of the mA and/or kV according to patient size and/or use of iterative reconstruction technique. COMPARISON:  Prior dedicated CT of the lumbar spine, correlation is made with CT abdomen pelvis 10/20/2018 FINDINGS: Segmentation: 5 lumbar type vertebrae. Alignment: Scoliosis with dextroscoliosis of the thoracolumbar junction and compensatory levoscoliosis of the lower lumbar spine. Trace right  lateral listhesis of L1 on L2 and 4 mm right lateral listhesis of L2 on L3. 4 mm left lateral listhesis of L4 on L5. Trace retrolisthesis of T12 on L1, L1 on L2, and L3 on L4. 5 mm anterolisthesis of L4 on L5, likely unchanged compared to 2017. Vertebrae: No acute fracture or suspicious osseous lesion. Compression deformity of T12 appears stable compared to 2017. Paraspinal and other soft tissues: Please see same-day CT abdomen pelvis. Disc levels: T12-L1: Trace retrolisthesis. Facet arthropathy. No spinal canal stenosis. Severe left neural foraminal narrowing. L1-L2: Trace retrolisthesis and minimal disc bulge. Moderate facet arthropathy. No spinal canal stenosis. Mild left neural foraminal narrowing. L2-L3: Mild-to-moderate disc bulge. Moderate facet arthropathy. Mild spinal canal stenosis. Mild bilateral  neural foraminal narrowing. L3-L4: Trace retrolisthesis. Mild disc bulge. Mild left and moderate right facet arthropathy. Mild spinal canal stenosis. Mild bilateral neural foraminal narrowing. L4-L5: Grade 1 anterolisthesis with disc unroofing. Moderate facet arthropathy. Ligamentum flavum hypertrophy. Moderate spinal canal stenosis. Moderate right neural foraminal narrowing. L5-S1: No significant disc bulge. Severe right right-greater-than-left facet arthropathy. No spinal canal stenosis. No neural foraminal narrowing. IMPRESSION: 1. No acute fracture or traumatic listhesis. 2. Scoliosis and multilevel degenerative changes, described in detail above. 3. L4-L5 moderate spinal canal stenosis and moderate right neural foraminal narrowing. 4. L2-L3 and L3-L4 mild spinal canal stenosis and mild bilateral neural foraminal narrowing. 5. T12-L1 severe left neural foraminal narrowing. Electronically Signed   By: Merilyn Baba M.D.   On: 02/12/2022 19:53   CT ABDOMEN PELVIS W CONTRAST  Result Date: 02/12/2022 CLINICAL DATA:  Acute abdominal pain. Back pain. Diagnosed with urinary tract infection yesterday. EXAM: CT  ABDOMEN AND PELVIS WITH CONTRAST TECHNIQUE: Multidetector CT imaging of the abdomen and pelvis was performed using the standard protocol following bolus administration of intravenous contrast. RADIATION DOSE REDUCTION: This exam was performed according to the departmental dose-optimization program which includes automated exposure control, adjustment of the mA and/or kV according to patient size and/or use of iterative reconstruction technique. CONTRAST:  156m OMNIPAQUE IOHEXOL 300 MG/ML  SOLN COMPARISON:  Abdominopelvic CT 10/20/2015. FINDINGS: Lower chest: Emphysema. The thoracic aorta is tortuous. The previous pleural based left lower lobe nodule is obscured by atelectasis and small pleural effusion on the current exam. There is also a small right pleural effusion and atelectasis. Hepatobiliary: There is 6 mm hypodensity in the inferior right hepatic lobe that is too small to accurately characterize. Ill-defined low-density in the central liver at site of hemangioma on prior CT. Clips in the gallbladder fossa postcholecystectomy. No biliary dilatation. Pancreas: Scattered punctate calcifications suggesting chronic pancreatitis. There is no acute pancreatic inflammation. No ductal dilatation or pancreatic mass. Spleen: Normal in size without focal abnormality. Adrenals/Urinary Tract: Normal adrenal glands. There is an obstructing 12 x 16 mm stone in the right proximal ureter with moderate hydronephrosis and perinephric edema. Mild enhancement of the right renal pelvis and proximal ureter. The ureter distal to this is decompressed. Additional intrarenal calculi in both kidneys. There is no left hydronephrosis. No suspicious renal lesion. Urinary bladder is decompressed by suprapubic catheter, small amount of high-density material in the dependent bladder is of unknown significance, possible layering stones. Stomach/Bowel: Detailed bowel assessment is limited in the absence of enteric contrast. The stomach is  nondistended. There is no evidence of small bowel obstruction or inflammation. Enteric sutures are noted within bowel loops in the pelvis. Moderate stool in the proximal colon, small volume of stool distally. Mild gaseous distension of transverse and proximal descending colon. No colonic inflammation. Vascular/Lymphatic: Moderate to advanced aortic atherosclerosis and tortuosity. No aneurysm. There is no bulky abdominopelvic adenopathy. Prominent portal caval node measuring 9 mm is not enlarged by size criteria. Reproductive: Status post hysterectomy. No adnexal masses. Other: Presacral soft tissue thickening edema. Right perirenal stranding. No free air or ascites. Musculoskeletal: Scoliosis and diffuse degenerative change in the spine. Chronic T12 compression deformity. Postsurgical change in the left proximal femur. No acute osseous findings. IMPRESSION: 1. Obstructing 12 x 16 mm stone in the right proximal ureter with moderate hydronephrosis and perinephric edema. Mild enhancement of the right renal pelvis and proximal ureter, may be related to renal obstruction or concurrent urinary tract infection. 2. Additional intrarenal calculi in both kidneys. 3.  Suprapubic catheter in the urinary bladder. Small amount of high-density material in the dependent bladder is of unknown significance, possible layering stones. 4. Additional stable chronic findings as described. Aortic Atherosclerosis (ICD10-I70.0) and Emphysema (ICD10-J43.9). Electronically Signed   By: Keith Rake M.D.   On: 02/12/2022 19:46   DG Chest 2 View  Result Date: 02/12/2022 CLINICAL DATA:  Chest pain EXAM: CHEST - 2 VIEW COMPARISON:  Chest x-ray dated October 05, 2021 FINDINGS: Low lung volumes. Cardiac and mediastinal contours are within normal limits. Trace bilateral pleural effusions and left-greater-than-right PEs atelectasis no evidence of pneumothorax. Severe compression deformities of the lower thoracic spine, unchanged when compared with  prior. IMPRESSION: Trace bilateral pleural effusions and left-greater-than-right basilar atelectasis. Electronically Signed   By: Yetta Glassman M.D.   On: 02/12/2022 19:22    EKG: Independently reviewed. Sinus tachycardia, rate 109, PAC, 1st degree AV block, LAFB.   Assessment/Plan  1. Obstructing ureteral stone; UTI  - Presentation concerning for early obstructed pyelonephritis  - Appreciate urology consultation, plan to continue empiric antibiotic and keep NPO after midnight   2. COPD  - Not in exacerbation on admission  - Continue albuterol as needed    3. Hx of seizures  - Seizure-free off of antiepileptics since August 2023    4. Dementia  - Delirium precautions    DVT prophylaxis: SCDs  Code Status: DNR  Level of Care: Level of care: Progressive Family Communication: None present  Disposition Plan:  Patient is from: SNF  Anticipated d/c is to: SNF Anticipated d/c date is: 11/9 or 02/15/22  Patient currently: Pending likely ureteral stent, transition to oral antibiotic  Consults called: Urology  Admission status: Inpatient     Vianne Bulls, MD [facility]  02/12/2022, 10:00 PM

## 2022-02-12 NOTE — ED Provider Notes (Signed)
[facility]-EMERGENCY DEPT Provider Note   CSN: [id number] Arrival date & time: 02/12/22  1719     History  Chief Complaint  Patient presents with   Back Pain    [name] is a 88 y.o. female with past medical hx of bronchitis, airway obstruction, emphysema, dementia, seizure disorder who presents with concern for back pain, dysuria, abdominal pain.  Patient has a suprapubic catheter, but cannot tell me for what reason this was inserted.  She was started on Cipro yesterday for UTI has only taken 1 dose.  She did any nausea, vomiting, diarrhea, but does endorse some body aches, chills, she denies any chest pain, shortness of breath.   Back Pain Associated symptoms: dysuria        Home Medications Prior to Admission medications   Medication Sig Start Date End Date Taking? Authorizing Provider  acetaminophen (TYLENOL) 325 MG tablet Take 650 mg by mouth every 6 (six) hours as needed for fever or mild pain.   Yes [provider]  Albuterol Sulfate (PROAIR RESPICLICK) 108 (90 Base) MCG/ACT AEPB Inhale 2 puffs into the lungs See admin instructions. Inhale 2 puffs into the lungs every morning as needed for shortness of breath or wheezing   Yes [provider]  ciprofloxacin (CIPRO) 500 MG tablet Take 500 mg by mouth 2 (two) times daily. 02/11/22  Yes [provider]  fluticasone (VERAMYST) 27.5 MCG/SPRAY nasal spray Place 1 spray into the nose daily.   Yes [provider]  hydrocortisone 2.5 % lotion Apply 1 application  topically 2 (two) times daily as needed (to scaly areas of face and ears).   Yes [provider]  hydrocortisone cream 1 % Apply 1 application  topically 2 (two) times daily as needed for itching (to scaly areas of face and ears).   Yes [provider]  ketoconazole (NIZORAL) 2 % cream Apply 1 application  topically daily as needed (to affected areas for psoriasis).   Yes [provider]   ketoconazole (NIZORAL) 2 % shampoo Apply 1 application  topically See admin instructions. Shampoo on Mondays and Thursdays   Yes [provider]  nystatin (MYCOSTATIN/NYSTOP) powder Apply 1 application  topically 2 (two) times daily as needed (urogenital area).   Yes [provider]  pramipexole (MIRAPEX) 0.5 MG tablet Take 0.5 mg by mouth at bedtime.   Yes [provider]  saccharomyces boulardii (FLORASTOR) 250 MG capsule Take 250 mg by mouth 2 (two) times daily. Encounter for prophylactic measures, unspecified   Yes [provider]  triamcinolone (KENALOG) 0.1 % Apply 1 application. topically daily as needed (psoriasis).   Yes [provider]      Allergies    Vioxx [rofecoxib], Dyazide [hydrochlorothiazide w-triamterene], Latex, Sulfa antibiotics, and Sumycin [tetracycline]    Review of Systems   Review of Systems  Genitourinary:  Positive for dysuria.  Musculoskeletal:  Positive for back pain.  All other systems reviewed and are negative.   Physical Exam Updated Vital Signs BP 109/73   Pulse (!) 101   Temp 98.4 F (36.9 C) (Oral)   Resp 14   Ht 5\' 2"  (1.575 m)   Wt 65.8 kg   LMP 02/16/1975   SpO2 95%   BMI 26.52 kg/m  Physical Exam Vitals and nursing note reviewed.  Constitutional:      General: She is not in acute distress.    Appearance: Normal appearance.  HENT:     Head: Normocephalic and atraumatic.  Eyes:     General:        Right eye: No discharge.        Left eye: No discharge.  Cardiovascular:     Rate and Rhythm: Regular rhythm. Tachycardia present.     Heart sounds: No murmur heard.    No friction rub. No gallop.  Pulmonary:     Effort: Pulmonary effort is normal.     Breath sounds: Normal breath sounds.  Abdominal:     General: Bowel sounds are normal.     Palpations: Abdomen is soft.     Comments: Ttp in the abdomen, mostly in right flank with some guarding. No rebound, rigidity.  Skin:    General:  Skin is warm and dry.     Capillary Refill: Capillary refill takes less than 2 seconds.  Neurological:     Mental Status: She is alert and oriented to person, place, and time.     Comments: Alert and oriented to baseline. Moves all limbs spontaneously.  Psychiatric:        Mood and Affect: Mood normal.        Behavior: Behavior normal.     ED Results / Procedures / Treatments   Labs (all labs ordered are listed, but only abnormal results are displayed) Labs Reviewed  CBC - Abnormal; Notable for the following components:      Result Value   WBC 19.1 (*)    RDW 16.9 (*)    All other components within normal limits  BASIC METABOLIC PANEL - Abnormal; Notable for the following components:   CO2 21 (*)    Glucose, Bld 114 (*)    BUN 37 (*)    Calcium 8.7 (*)    GFR, Estimated 56 (*)    All other components within normal limits  URINALYSIS, ROUTINE W REFLEX MICROSCOPIC - Abnormal; Notable for the following components:   Glucose, UA 100 (*)    Hgb urine dipstick LARGE (*)    Bilirubin Urine SMALL (*)    Protein, ur 100 (*)    Nitrite POSITIVE (*)    Leukocytes,Ua MODERATE (*)    All other components within normal limits  URINALYSIS, MICROSCOPIC (REFLEX) - Abnormal; Notable for the following components:   Bacteria, UA FEW (*)    All other components within normal limits  RESP PANEL BY RT-PCR (FLU A&B, COVID) [id number]  URINE CULTURE    EKG None  Radiology CT L-SPINE NO CHARGE  Result Date: 02/12/2022 CLINICAL DATA:  Back pain EXAM: CT LUMBAR SPINE WITHOUT CONTRAST TECHNIQUE: Multidetector CT imaging of the lumbar spine was performed without intravenous contrast administration. Multiplanar CT image reconstructions were also generated. RADIATION DOSE REDUCTION: This exam was performed according to the departmental dose-optimization program which includes automated exposure control, adjustment of the mA and/or kV according to patient size and/or use of iterative reconstruction  technique. COMPARISON:  Prior dedicated CT of the lumbar spine, correlation is made with CT abdomen pelvis 10/20/2018 FINDINGS: Segmentation: 5 lumbar type vertebrae. Alignment: Scoliosis with dextroscoliosis of the thoracolumbar junction and compensatory levoscoliosis of the lower lumbar spine. Trace right lateral listhesis of L1 on L2 and 4 mm right lateral listhesis of L2 on L3. 4 mm left lateral listhesis of L4 on L5. Trace retrolisthesis of T12 on L1, L1 on L2, and L3 on L4. 5 mm anterolisthesis of L4 on L5, likely unchanged compared to 2017. Vertebrae: No acute fracture or suspicious osseous lesion. Compression deformity of T12 appears stable compared to 2017.  Paraspinal and other soft tissues: Please see same-day CT abdomen pelvis. Disc levels: T12-L1: Trace retrolisthesis. Facet arthropathy. No spinal canal stenosis. Severe left neural foraminal narrowing. L1-L2: Trace retrolisthesis and minimal disc bulge. Moderate facet arthropathy. No spinal canal stenosis. Mild left neural foraminal narrowing. L2-L3: Mild-to-moderate disc bulge. Moderate facet arthropathy. Mild spinal canal stenosis. Mild bilateral neural foraminal narrowing. L3-L4: Trace retrolisthesis. Mild disc bulge. Mild left and moderate right facet arthropathy. Mild spinal canal stenosis. Mild bilateral neural foraminal narrowing. L4-L5: Grade 1 anterolisthesis with disc unroofing. Moderate facet arthropathy. Ligamentum flavum hypertrophy. Moderate spinal canal stenosis. Moderate right neural foraminal narrowing. L5-S1: No significant disc bulge. Severe right right-greater-than-left facet arthropathy. No spinal canal stenosis. No neural foraminal narrowing. IMPRESSION: 1. No acute fracture or traumatic listhesis. 2. Scoliosis and multilevel degenerative changes, described in detail above. 3. L4-L5 moderate spinal canal stenosis and moderate right neural foraminal narrowing. 4. L2-L3 and L3-L4 mild spinal canal stenosis and mild bilateral neural  foraminal narrowing. 5. T12-L1 severe left neural foraminal narrowing. Electronically Signed   By: Wiliam Ke M.D.   On: 02/12/2022 19:53   CT ABDOMEN PELVIS W CONTRAST  Result Date: 02/12/2022 CLINICAL DATA:  Acute abdominal pain. Back pain. Diagnosed with urinary tract infection yesterday. EXAM: CT ABDOMEN AND PELVIS WITH CONTRAST TECHNIQUE: Multidetector CT imaging of the abdomen and pelvis was performed using the standard protocol following bolus administration of intravenous contrast. RADIATION DOSE REDUCTION: This exam was performed according to the departmental dose-optimization program which includes automated exposure control, adjustment of the mA and/or kV according to patient size and/or use of iterative reconstruction technique. CONTRAST:  OMNIPAQUE IOHEXOL 300 MG/ML  SOLN COMPARISON:  Abdominopelvic CT 10/20/2015. FINDINGS: Lower chest: Emphysema. The thoracic aorta is tortuous. The previous pleural based left lower lobe nodule is obscured by atelectasis and small pleural effusion on the current exam. There is also a small right pleural effusion and atelectasis. Hepatobiliary: There is 6 mm hypodensity in the inferior right hepatic lobe that is too small to accurately characterize. Ill-defined low-density in the central liver at site of hemangioma on prior CT. Clips in the gallbladder fossa postcholecystectomy. No biliary dilatation. Pancreas: Scattered punctate calcifications suggesting chronic pancreatitis. There is no acute pancreatic inflammation. No ductal dilatation or pancreatic mass. Spleen: Normal in size without focal abnormality. Adrenals/Urinary Tract: Normal adrenal glands. There is an obstructing 12 x 16 mm stone in the right proximal ureter with moderate hydronephrosis and perinephric edema. Mild enhancement of the right renal pelvis and proximal ureter. The ureter distal to this is decompressed. Additional intrarenal calculi in both kidneys. There is no left hydronephrosis.  No suspicious renal lesion. Urinary bladder is decompressed by suprapubic catheter, small amount of high-density material in the dependent bladder is of unknown significance, possible layering stones. Stomach/Bowel: Detailed bowel assessment is limited in the absence of enteric contrast. The stomach is nondistended. There is no evidence of small bowel obstruction or inflammation. Enteric sutures are noted within bowel loops in the pelvis. Moderate stool in the proximal colon, small volume of stool distally. Mild gaseous distension of transverse and proximal descending colon. No colonic inflammation. Vascular/Lymphatic: Moderate to advanced aortic atherosclerosis and tortuosity. No aneurysm. There is no bulky abdominopelvic adenopathy. Prominent portal caval node measuring 9 mm is not enlarged by size criteria. Reproductive: Status post hysterectomy. No adnexal masses. Other: Presacral soft tissue thickening edema. Right perirenal stranding. No free air or ascites. Musculoskeletal: Scoliosis and diffuse degenerative change in the spine. Chronic T12 compression deformity.  Postsurgical change in the left proximal femur. No acute osseous findings. IMPRESSION: 1. Obstructing 12 x 16 mm stone in the right proximal ureter with moderate hydronephrosis and perinephric edema. Mild enhancement of the right renal pelvis and proximal ureter, may be related to renal obstruction or concurrent urinary tract infection. 2. Additional intrarenal calculi in both kidneys. 3. Suprapubic catheter in the urinary bladder. Small amount of high-density material in the dependent bladder is of unknown significance, possible layering stones. 4. Additional stable chronic findings as described. Aortic Atherosclerosis (ICD10-I70.0) and Emphysema (ICD10-J43.9). Electronically Signed   By: Narda Rutherford M.D.   On: 02/12/2022 19:46   DG Chest 2 View  Result Date: 02/12/2022 CLINICAL DATA:  Chest pain EXAM: CHEST - 2 VIEW COMPARISON:  Chest  x-ray dated October 05, 2021 FINDINGS: Low lung volumes. Cardiac and mediastinal contours are within normal limits. Trace bilateral pleural effusions and left-greater-than-right PEs atelectasis no evidence of pneumothorax. Severe compression deformities of the lower thoracic spine, unchanged when compared with prior. IMPRESSION: Trace bilateral pleural effusions and left-greater-than-right basilar atelectasis. Electronically Signed   By: Allegra Lai M.D.   On: 02/12/2022 19:22    Procedures Procedures    Medications Ordered in ED Medications  ketorolac (TORADOL) 15 MG/ML injection 15 mg (15 mg Intravenous Given 02/12/22 1830)  morphine (PF) 2 MG/ML injection 2 mg (2 mg Intravenous Given 02/12/22 1830)  cefTRIAXone (ROCEPHIN) 1 g in sodium chloride 0.9 % 100 mL IVPB (0 g Intravenous Stopped 02/12/22 2057)  sodium chloride 0.9 % bolus 1,000 mL (0 mLs Intravenous Stopped 02/12/22 2057)  iohexol (OMNIPAQUE) 300 MG/ML solution 100 mL (100 mLs Intravenous Contrast Given 02/12/22 1913)    ED Course/ Medical Decision Making/ A&P                           Medical Decision Making Amount and/or Complexity of Data Reviewed Labs: ordered. Radiology: ordered.  Risk Prescription drug management. Decision regarding hospitalization.   This patient is a 88 y.o. female who presents to the ED for concern of back pain, tachycardia, flank pain, dysuria, this involves an extensive number of treatment options, and is a complaint that carries with it a high risk of complications and morbidity. The emergent differential diagnosis prior to evaluation includes, but is not limited to,  uti, pyelonephritis, nephrolithiasis, other intraabdominal abnormality, vs. Spinal pathology epidural abscess, compression fracture vs other .   This is not an exhaustive differential.   Past Medical History / Co-morbidities / Social History: bronchitis, airway obstruction, emphysema, dementia, seizure disorder  Additional  history: Chart reviewed. Pertinent results include: Facility reviewed recent emergency department visits, including lab work, imaging, admission from May, as well as outpatient notes detailing suprapubic catheter insertion in October for urinary retention  Physical Exam: Physical exam performed. The pertinent findings include: Patient with some dementia at baseline but is alert, oriented to self, and situation, as well as place, she is tender to palpation in the right flank with some guarding, no rebound, rigidity  Lab Tests: I ordered, and personally interpreted labs.  The pertinent results include: CBC notable for significant leukocytosis at 19 despite beginning antibiotics yesterday, no clinically significant anemia, her BMP is for the most part unremarkable, BUN elevated at 37 however, however creatinine overall unremarkable   Imaging Studies: I ordered imaging studies including CT abdomen pelvis with contrast, plain film chest x-ray. I independently visualized and interpreted imaging which showed 12 x 16 mm obstructing right  ureteral stone with hydronephrosis, perinephric edema.  Patient with some scoliosis, as well as canal stenosis but no evidence of acute fracture in the L-spine.  I agree with the radiologist interpretation.   Cardiac Monitoring:  The patient was maintained on a cardiac monitor.  My attending physician Dr. Durwin Nora viewed and interpreted the cardiac monitored which showed an underlying rhythm of: sinus tachycardia. I agree with this interpretation.   Medications: I ordered medication including Rocephin for pyelonephritis, fluid bolus, Toradol, morphine for pain.  Given the extent of patient's large stone as well as noted urinary tract infection patient will require reevaluation in the hospital.  Consultations Obtained: I requested consultation with the urologist, spoke with Dr. Berneice Heinrich, as well as hospitalist, spoke with Dr. Antionette Char,  and discussed lab and imaging findings as well  as pertinent plan - they recommend: Patient to be admitted for observation, urology recommended no emergent surgical intervention at this time, reported unlikely to need PCN, may require stenting.   Disposition: After consideration of the diagnostic results and the patients response to treatment, I feel that patient would benefit from admission as discussed above.   I discussed this case with my attending physician Dr. Durwin Nora who cosigned this note including patient's presenting symptoms, physical exam, and planned diagnostics and interventions. Attending physician stated agreement with plan or made changes to plan which were implemented.    Final Clinical Impression(s) / ED Diagnoses Final diagnoses:  Obstructive pyelonephritis    Rx / DC Orders ED Discharge Orders     None         [place] 02/12/22 2156    Gloris Manchester, MD 02/13/22 [phone]

## 2022-02-12 NOTE — Assessment & Plan Note (Signed)
Not apparent, dc Furosemide, Kcl in setting of poor oral intake.

## 2022-02-12 NOTE — Assessment & Plan Note (Signed)
Urology 01/09/22 catheter plug, goal if to remove [facility] or replacement

## 2022-02-12 NOTE — Assessment & Plan Note (Signed)
COPD, on ProAir, stable.

## 2022-02-12 NOTE — Assessment & Plan Note (Signed)
02/12/22 wbc 17.3, neutrophils 82.4%, Bun/creat 31/1.02, the patient was also noted low Bp 90/60s, appears weak, suggest ED for further evaluation and treatment for ? Urosepsis.

## 2022-02-12 NOTE — Progress Notes (Signed)
[employer]ufact[employer] [facility]) Hospital Liaison Note  This is a current hospice patient with Manufacturing engineer. Please call with any questions or concerns. Thank you  Roselee Nova, [facility] Liaison [phone]

## 2022-02-12 NOTE — Assessment & Plan Note (Signed)
stable, taking MiraPex.  

## 2022-02-12 NOTE — Progress Notes (Signed)
Location:   SNF [facility] Room Number: NO/29/A Place of Service:  SNF (31) Provider: Lennie Odor Steffen Hase NP  [name], MD  Patient Care Team: [name], MD as PCP - General (Internal Medicine) [name], MD as Consulting Physician (Ophthalmology) [name], MD as Consulting Physician (Cardiology) [name], MD as Consulting Physician (Dermatology) [name], MD as Consulting Physician (Orthopedic Surgery) [name], MD as Consulting Physician (Orthopedic Surgery) [name], MD as Consulting Physician (Orthopedic Surgery) [place], [place], Takita Riecke X, NP as Nurse Practitioner (Nurse Practitioner) [name], MD (Inactive) as Consulting Physician (Neurology)  Extended Emergency Contact Information Primary Emergency Contact: [name],[name] Address: [address]          [place], [place] [address] [name] of [place] Phone: [phone] Relation: Sister Secondary Emergency Contact: [name] [place] of [place] Phone: [phone] Mobile Phone: [phone] Relation: Sister  Code Status: DNR Goals of care: Advanced Directive information    12/18/2021   10:46 AM  Advanced Directives  Does Patient Have a Medical Advance Directive? Yes  Type of Advance Directive Out of facility DNR (pink MOST or yellow form)  Does patient want to make changes to medical advance directive? No - Patient declined  Pre-existing out of facility DNR order (yellow form or pink MOST form) Pink MOST/Yellow Form most recent copy in chart - Physician notified to receive inpatient order     Chief Complaint  Patient presents with   Acute Visit    Patient is being seen for UTI    HPI:  Pt is a 88 y.o. female seen today for an acute visit for lethargy, generalized weakness, poor appetite, UA showed Cipro started 02/11/22, urine culture pending, since 02/04/22 c/o burning sensation upon urination and increased  urinary frequency, urine contains mucous                          Hypokalemia, supplemented, K 3.7 12/20/21             BLE edema, not apparent, needs off Furosemide.              Adult failure to thrive, prn Lorazepam used a few times, no Haldol or Morphine needed.              Suprapubic Catheter, Urology 01/09/22 catheter plug, goal if to remove SPC or replacement             Depression, stable, no s/s of psychosis, prn Lorazepam is adequate              Dementia, under hospice service             Restless leg syndrome, stable, taking MiraPex.              Hx of seizures, no active seizures, off meds.              COPD, on ProAir  Past Medical History:  Diagnosis Date   Acute bronchitis 05/23/2011   Acute upper respiratory infections of unspecified site 05/23/2011   Arthritis    Chronic airway obstruction, not elsewhere classified 05/23/2011   Disturbance of salivary secretion 01/31/2011   Dizziness and giddiness 01/31/2011   Dyspnea    Essential tremor 04/25/2014   External hemorrhoids without mention of complication 63/78/5885   Gait disorder 04/25/2014   Insomnia, unspecified 09/12/2011  Lumbago 01/31/2011   Major depressive disorder, single episode, unspecified 01/31/2011   Memory disorder 04/25/2014   Mitral valve disorders(424.0) 01/31/2011   Other and unspecified hyperlipidemia 01/31/2011   Other convulsions 01/31/2011   Other emphysema ([facility]) 01/31/2011   Pain in joint, site unspecified 01/31/2011   Restless legs syndrome (RLS) 09/12/2011   Retinal detachment with retinal defect of right eye 2011   right eye twice   Seizure disorder ([place])    Senile osteoporosis 01/31/2011   Spontaneous ecchymoses 01/31/2011   Stiffness of joints, not elsewhere classified, multiple sites 01/31/2011   Unspecified essential hypertension 01/31/2011   Past Surgical History:  Procedure Laterality Date   ABDOMINAL HYSTERECTOMY  06/21/2003   TAH/BSO, omenectomy PSB resect, Stg IC cystadenofibroma    CHOLECYSTECTOMY  2005   Dr. Marlou Starks   ELBOW SURGERY Right 2008   broken   Dr. Apolonio Schneiders   EYE SURGERY     INTRAMEDULLARY (IM) NAIL INTERTROCHANTERIC Left 08/30/2021   Procedure: INTRAMEDULLARY (IM) NAIL INTERTROCHANTRIC;  Surgeon: Rod Can, MD;  Location: [facility];  Service: Orthopedics;  Laterality: Left;   ORIF PATELLA Left 05/02/2020   Procedure: OPEN REDUCTION INTERNAL (ORIF) FIXATION LEFT PATELLA WITH MEDIAL AND LATERAL LIGAMENT REINFORCEMENTS;  Surgeon: [name], MD;  Location: [facility];  Service: Orthopedics;  Laterality: Left;   ORIF PATELLA Left 05/30/2020   Procedure: OPEN REDUCTION INTERNAL (ORIF) FIXATION PATELLA;  Surgeon: [name], MD;  Location: [facility];  Service: Orthopedics;  Laterality: Left;   RETINAL DETACHMENT SURGERY N/A    two   REVERSE SHOULDER ARTHROPLASTY Left 05/06/2019   Procedure: REVERSE SHOULDER ARTHROPLASTY;  Surgeon: Justice Britain, MD;  Location: [facility];  Service: Orthopedics;  Laterality: Left;  176mn   ROTATOR CUFF REPAIR Right 2012   Dr. CTheda Sers  SQUAMOUS CELL CARCINOMA EXCISION Bilateral 2012, 8/14   [place] on legs   Dr. GSarajane Jews  TONSILLECTOMY  1941   VIDEO BRONCHOSCOPY WITH ENDOBRONCHIAL NAVIGATION N/A 11/29/2015   Procedure: VIDEO BRONCHOSCOPY WITH ENDOBRONCHIAL NAVIGATION;  Surgeon: RCollene Gobble MD;  [place]  Service: Thoracic;  Laterality: N/A;    Allergies  Allergen Reactions   Vioxx [Rofecoxib] Shortness Of Breath   Dyazide [Hydrochlorothiazide W-Triamterene] Other (See Comments)    Lowers blood pressure too much   Latex Swelling and Other (See Comments)    "Allergic," per MAR   Sulfa Antibiotics Nausea And Vomiting and Other (See Comments)    [facility]  Sumycin [Tetracycline] Other (See Comments)    Can't take due to drug reaction- [facility]   Allergies as of 02/12/2022       Reactions   Vioxx [rofecoxib] Shortness Of Breath   Dyazide [hydrochlorothiazide W-triamterene] Other (See  Comments)   Lowers blood pressure too much   Latex Swelling, Other (See Comments)   "Allergic," per MAR   Sulfa Antibiotics Nausea And Vomiting, Other (See Comments)   [facility]  Sumycin [tetracycline] Other (See Comments)   Can't take due to drug reaction- "All[facility]       Medication List        Accurate as of February 12, 2022  4:14 PM. If you have any questions, ask your nurse or doctor.          STOP taking these medications    furosemide 20 MG tablet Commonly known as: LASIX Stopped by: Shylah Dossantos X Adaria Hole, NP   potassium chloride SA 20 MEQ tablet Commonly known  asRhetta Mura Stopped by: Destenee Guerry X Jamiaya Bina, NP       TAKE these medications    hydrocortisone cream 1 % Apply 1 application  topically 2 (two) times daily as needed for itching (to scaly areas of face and ears).   hydrocortisone 2.5 % lotion Apply 1 application  topically 2 (two) times daily as needed (to scaly areas of face and ears).   ketoconazole 2 % shampoo Commonly known as: NIZORAL Apply 1 application  topically See admin instructions. Shampoo on Mondays and Thursdays   ketoconazole 2 % cream Commonly known as: NIZORAL Apply 1 application  topically daily as needed (to affected areas for psoriasis).   LORazepam 1 MG tablet Commonly known as: ATIVAN Take 1 mg by mouth every 4 (four) hours as needed for anxiety.   nystatin powder Commonly known as: MYCOSTATIN/NYSTOP Apply 1 application  topically 2 (two) times daily as needed (urogenital area).   pramipexole 0.5 MG tablet Commonly known as: MIRAPEX Take 0.5 mg by mouth at bedtime.   ProAir RespiClick 967 (90 Base) MCG/ACT Aepb Generic drug: Albuterol Sulfate Inhale 2 puffs into the lungs See admin instructions. Inhale 2 puffs into the lungs every morning as needed for shortness of breath or wheezing   saccharomyces boulardii 250 MG capsule Commonly known as: FLORASTOR Take 250 mg by mouth 2 (two) times daily. Encounter for  prophylactic measures, unspecified   triamcinolone cream 0.1 % Commonly known as: KENALOG Apply 1 application. topically daily as needed (psoriasis).        Review of Systems  Constitutional:  Positive for appetite change and fatigue. Negative for fever.  HENT:  Positive for congestion and hearing loss. Negative for trouble swallowing.   Eyes:  Negative for visual disturbance.  Respiratory:  Negative for cough.        DOE is chronic  Cardiovascular:  Negative for leg swelling.  Gastrointestinal:  Negative for abdominal pain and constipation.       Acid reflux symptoms.   Genitourinary:  Positive for dysuria, frequency and urgency. Negative for hematuria.       [facility]  Musculoskeletal:  Positive for arthralgias and gait problem.       S/p left hip ORIF, ORIF of the left patella.   Skin:  Positive for wound. Negative for color change.       BLE discoloration, chronic venous insufficiency skin changes   Neurological:  Negative for seizures, speech difficulty, weakness and headaches.       Memory lapses. Hx of seizures. RLS  Psychiatric/Behavioral:  Negative for behavioral problems and sleep disturbance. The patient is not nervous/anxious.     Immunization History  Administered Date(s) Administered   Influenza Split 01/06/2014, 01/15/2017, 01/08/2018, 12/09/2018   Influenza Whole 01/07/2012, 01/06/2013   Influenza, High Dose Seasonal PF 12/25/2015, 01/20/2017   Influenza,inj,Quad PF,6+ Mos 12/21/2014   Influenza-Unspecified 12/09/2018, 01/18/2020, 01/24/2021   Moderna SARS-COV2 Booster Vaccination 04/02/2021   Moderna Sars-Covid-2 Vaccination 04/12/2019, 06/05/2019, 02/15/2020, 09/05/2020   PFIZER(Purple Top)SARS-COV-2 Vaccination 12/26/2020   Pneumococcal Conjugate-13 02/01/2014   Pneumococcal Polysaccharide-23 12/20/1992, 01/15/2000, 04/08/2004, 06/08/2004   Td 04/08/2002, 04/22/2002   Tdap 04/09/2011, 08/17/2015   Zoster Recombinat (Shingrix) 05/29/2005, 08/27/2017   Zoster,  Live 04/08/2008, 05/29/2014, 05/28/2017   Zoster, Unspecified 05/29/2005   Pertinent  Health Maintenance Due  Topic Date Due   INFLUENZA VACCINE  11/06/2021   DEXA SCAN  Completed   MAMMOGRAM  Discontinued      09/07/2021    9:30 PM 09/08/2021  12:40 PM 11/04/2021   10:37 AM 11/04/2021   11:24 PM 11/05/2021   10:59 AM  Fall Risk  Patient Fall Risk Level High fall risk High fall risk Moderate fall risk High fall risk High fall risk   Functional Status Survey:    Vitals:   02/12/22 1343  BP: 102/68  Pulse: 93  Resp: 17  Temp: 97.9 F (36.6 C)  SpO2: 97%  Weight: 145 lb (65.8 kg)  Height: _0  (1.575 m)   Body mass index is 26.52 kg/m. Physical Exam Vitals and nursing note reviewed.  Constitutional:      Comments: Seeing in bed, the patient usual is up during day.   HENT:     Head: Normocephalic and atraumatic.     Nose: No congestion or rhinorrhea.     Mouth/Throat:     Mouth: Mucous membranes are moist.  Eyes:     Extraocular Movements: Extraocular movements intact.     Conjunctiva/sclera: Conjunctivae normal.     Right eye: Right conjunctiva is not injected.     Left eye: Left conjunctiva is not injected.     Pupils: Pupils are equal, round, and reactive to light.  Neck:     Comments: Left adam's apple bony aspect, sometimes feels soreness on palpation is chronic, no noted lymph nodes  Cardiovascular:     Rate and Rhythm: Normal rate and regular rhythm.     Heart sounds: Murmur heard.     Comments: PD pulses are not felt from previous examination.  Pulmonary:     Effort: Pulmonary effort is normal.     Breath sounds: No rales.     Comments: Decreased air entry to both lungs.  Abdominal:     General: Bowel sounds are normal.     Palpations: Abdomen is soft.     Tenderness: There is no abdominal tenderness. There is no right CVA tenderness, left CVA tenderness, guarding or rebound.     Comments: Mid abd surgical scar. [facility]  Musculoskeletal:     Cervical back:  Normal range of motion and neck supple.     Right lower leg: No edema.     Left lower leg: No edema.     Comments: Decreased overhead ROM of the left shoulder. Left knee s/p ORIF of the patella fx.  Skin:    General: Skin is warm and dry.     Comments: BLE discoloration, chronic venous insufficiency skin changes, lateral left lower leg slow healing previous ruptured hematoma. A quarter sized red, warmth, raised, fluid filled area on the previous left knee surgical incision-resolved   Neurological:     General: No focal deficit present.     Mental Status: She is alert. Mental status is at baseline.     Gait: Gait abnormal.  Psychiatric:        Mood and Affect: Mood normal.        Behavior: Behavior normal.     Labs reviewed: Recent Labs    09/02/21 0359 09/04/21 1308 09/06/21 0449 11/04/21 1047 11/13/21 0000 12/11/21 0000 12/13/21 0000  NA 138 144   < > 145 144 142 144  K 3.2* 3.9   < > 3.2* 3.7 3.0* 3.3*  CL 104 108   < > 109 103 107 109*  CO2 27 26   < > 27 28* 27* 28*  GLUCOSE 98 102*  --  80  --   --   --   BUN 21 31*   < > 16  28* 10 11  CREATININE 0.78 0.88   < > 0.69 0.7 0.6 0.6  CALCIUM 8.5* 8.8*   < > 9.6 9.3 8.2* 8.5*  MG 2.2  --   --   --   --   --   --    < > = values in this interval not displayed.   Recent Labs    11/02/21 0000 11/04/21 1047 12/11/21 0000  AST 19 20 12*  ALT _0 ALKPHOS 160* 157* 137*  BILITOT  --  0.5  --   PROT  --  7.0  --   ALBUMIN 3.7 3.6 3.0*   Recent Labs    09/02/21 0359 09/04/21 1308 09/11/21 0000 11/02/21 0000 11/04/21 1047 12/11/21 0000  WBC 7.8 7.7   < > 4.3 4.7 4.0  NEUTROABS  --   --    < > 2,275.00 3.2 2,352.00  HGB 9.4* 10.0*   < > 11.6* 12.9 11.7*  HCT 30.6* 32.5*   < > 36 41.6 37  MCV 85.2 86.4  --   --  83.7  --   PLT 190 239   < > 243 252 179   < > = values in this interval not displayed.   Lab Results  Component Value Date   TSH 1.89 05/22/2021   No results found for: "HGBA1C" Lab Results   Component Value Date   CHOL 147 09/27/2021   HDL 48 09/27/2021   LDLCALC 78 09/27/2021   TRIG 131 09/27/2021   CHOLHDL 3.6 06/27/2015    Significant Diagnostic Results in last 30 days:  No results found.  Assessment/Plan: Dysuria 02/11/22 empirical treat with Cipro 529m bid x 7 days [facility] has developed generalized weakness and poor appetite.    lethargy, generalized weakness, poor appetite, urine culture pending, since 02/04/22 c/o burning sensation upon urination and increased urinary frequency, urine contains mucous. Stat CBC/diff, CMP/eGFR.   Suprapubic catheter ([facility]  Urology 01/09/22 catheter plug, goal if to remove SPC or replacement  Hypokalemia  supplemented, K 3.7 12/20/21  Edema Not apparent, dc Furosemide, Kcl in setting of poor oral intake.   Adult failure to thrive prn Lorazepam used a few times, no Haldol or Morphine needed.   Cognitive and behavioral changes no s/s of psychosis, prn Lorazepam is adequate. Dementia, under hospice service  Restless legs syndrome (RLS)  stable, taking MiraPex.   [facility]  no active seizures, off meds.                COPD (chronic obstructive pulmonary disease) (HCC) COPD, on ProAir, stable.   Leukocytosis 02/12/22 wbc 17.3, neutrophils 82.4%, Bun/creat 31/1.02, the patient was also noted low Bp 90/60s, appears weak, suggest ED for further evaluation and treatment for ? Urosepsis.     Family/ staff Communication: plan of care reviewed with the patient and charge nurse.   Labs/tests ordered:  CBC/diff, CMP/eGFR  Time spend 35 minutes.

## 2022-02-13 ENCOUNTER — Encounter (HOSPITAL_COMMUNITY)
Admission: EM | Disposition: A | Discharge status: Skilled Nursing Facility | Payer: Self-pay | Source: Skilled Nursing Facility | Attending: Internal Medicine

## 2022-02-13 ENCOUNTER — Encounter (HOSPITAL_COMMUNITY): Payer: Self-pay | Admitting: Family Medicine

## 2022-02-13 ENCOUNTER — Inpatient Hospital Stay (HOSPITAL_COMMUNITY)

## 2022-02-13 ENCOUNTER — Inpatient Hospital Stay (HOSPITAL_COMMUNITY): Admitting: Anesthesiology

## 2022-02-13 DIAGNOSIS — J449 Chronic obstructive pulmonary disease, unspecified: Secondary | ICD-10-CM | POA: Diagnosis not present

## 2022-02-13 DIAGNOSIS — F0390 Unspecified dementia without behavioral disturbance: Secondary | ICD-10-CM | POA: Diagnosis not present

## 2022-02-13 DIAGNOSIS — Z87891 Personal history of nicotine dependence: Secondary | ICD-10-CM

## 2022-02-13 DIAGNOSIS — I10 Essential (primary) hypertension: Secondary | ICD-10-CM

## 2022-02-13 DIAGNOSIS — R569 Unspecified convulsions: Secondary | ICD-10-CM

## 2022-02-13 DIAGNOSIS — N111 Chronic obstructive pyelonephritis: Secondary | ICD-10-CM | POA: Diagnosis not present

## 2022-02-13 DIAGNOSIS — I2510 Atherosclerotic heart disease of native coronary artery without angina pectoris: Secondary | ICD-10-CM

## 2022-02-13 DIAGNOSIS — N132 Hydronephrosis with renal and ureteral calculous obstruction: Secondary | ICD-10-CM

## 2022-02-13 HISTORY — PX: CYSTOSCOPY W/ URETERAL STENT PLACEMENT: SHX1429

## 2022-02-13 LAB — BASIC METABOLIC PANEL
Anion gap: 7 (ref 5–15)
BUN: 36 mg/dL — ABNORMAL HIGH (ref 8–23)
CO2: 22 mmol/L (ref 22–32)
Calcium: 8.3 mg/dL — ABNORMAL LOW (ref 8.9–10.3)
Chloride: 112 mmol/L — ABNORMAL HIGH (ref 98–111)
Creatinine, Ser: 0.87 mg/dL (ref 0.44–1.00)
GFR, Estimated: >60 mL/min (ref >60)
Glucose, Bld: 97 mg/dL (ref 70–99)
Potassium: 3.8 mmol/L (ref 3.5–5.1)
Sodium: 141 mmol/L (ref 135–145)

## 2022-02-13 LAB — GLUCOSE, CAPILLARY: Glucose-Capillary: 109 mg/dL — ABNORMAL HIGH (ref 70–99)

## 2022-02-13 LAB — CBC
HCT: 38.7 % (ref 36.0–46.0)
Hemoglobin: 11.6 g/dL — ABNORMAL LOW (ref 12.0–15.0)
MCH: 25.6 pg — ABNORMAL LOW (ref 26.0–34.0)
MCHC: 30 g/dL (ref 30.0–36.0)
MCV: 85.2 fL (ref 80.0–100.0)
Platelets: 217 10*3/uL (ref 150–400)
RBC: 4.54 MIL/uL (ref 3.87–5.11)
RDW: 17 % — ABNORMAL HIGH (ref 11.5–15.5)
WBC: 12.4 10*3/uL — ABNORMAL HIGH (ref 4.0–10.5)
nRBC: 0 % (ref 0.0–0.2)

## 2022-02-13 LAB — URINE CULTURE

## 2022-02-13 LAB — MAGNESIUM: Magnesium: 2.3 mg/dL (ref 1.7–2.4)

## 2022-02-13 SURGERY — CYSTOSCOPY, WITH RETROGRADE PYELOGRAM AND URETERAL STENT INSERTION
Anesthesia: Monitor Anesthesia Care | Laterality: Right

## 2022-02-13 MED ORDER — ACETAMINOPHEN 10 MG/ML IV SOLN: 1000.0000 mg | Freq: Once | INTRAVENOUS | Status: AC

## 2022-02-13 MED ORDER — SODIUM CHLORIDE 0.9 % IV SOLN
2.0000 g | INTRAVENOUS | Status: DC
  Administered 2022-02-13 – 2022-02-14 (×2): 2 g via INTRAVENOUS
  Filled 2022-02-13 (×2): qty 20

## 2022-02-13 MED ORDER — FENTANYL CITRATE PF 50 MCG/ML IJ SOSY: 25.0000 ug | PREFILLED_SYRINGE | INTRAMUSCULAR | Status: DC | PRN

## 2022-02-13 MED ORDER — SODIUM CHLORIDE 0.9 % IV SOLN: INTRAVENOUS | Status: DC

## 2022-02-13 MED ORDER — ORAL CARE MOUTH RINSE: 15.0000 mL | Freq: Once | OROMUCOSAL | Status: AC

## 2022-02-13 MED ORDER — IOHEXOL 300 MG/ML  SOLN
INTRAMUSCULAR | Status: DC | PRN
  Administered 2022-02-13: 10 mL

## 2022-02-13 MED ORDER — SUCCINYLCHOLINE CHLORIDE 200 MG/10ML IV SOSY
PREFILLED_SYRINGE | INTRAVENOUS | Status: AC
  Filled 2022-02-13: qty 10

## 2022-02-13 MED ORDER — METOPROLOL TARTRATE 5 MG/5ML IV SOLN
INTRAVENOUS | Status: AC
  Filled 2022-02-13: qty 5

## 2022-02-13 MED ORDER — FENTANYL CITRATE (PF) 100 MCG/2ML IJ SOLN
INTRAMUSCULAR | Status: AC
  Filled 2022-02-13: qty 2

## 2022-02-13 MED ORDER — ARTIFICIAL TEARS OPHTHALMIC OINT
TOPICAL_OINTMENT | OPHTHALMIC | Status: AC
  Filled 2022-02-13: qty 3.5

## 2022-02-13 MED ORDER — SODIUM CHLORIDE 0.9 % IR SOLN
Status: DC | PRN
  Administered 2022-02-13: 3000 mL via INTRAVESICAL

## 2022-02-13 MED ORDER — LIDOCAINE HCL (PF) 2 % IJ SOLN
INTRAMUSCULAR | Status: AC
  Filled 2022-02-13: qty 5

## 2022-02-13 MED ORDER — PHENYLEPHRINE 80 MCG/ML (10ML) SYRINGE FOR IV PUSH (FOR BLOOD PRESSURE SUPPORT)
PREFILLED_SYRINGE | INTRAVENOUS | Status: DC | PRN
  Administered 2022-02-13 (×2): 160 ug via INTRAVENOUS

## 2022-02-13 MED ORDER — METOPROLOL TARTRATE 5 MG/5ML IV SOLN
INTRAVENOUS | Status: DC | PRN
  Administered 2022-02-13 (×2): 1 mg via INTRAVENOUS

## 2022-02-13 MED ORDER — STERILE WATER FOR IRRIGATION IR SOLN
Status: DC | PRN
  Administered 2022-02-13: 5 mL

## 2022-02-13 MED ORDER — ACETAMINOPHEN 10 MG/ML IV SOLN
INTRAVENOUS | Status: AC
  Administered 2022-02-13: 1000 mg via INTRAVENOUS
  Filled 2022-02-13: qty 100

## 2022-02-13 MED ORDER — ESMOLOL HCL 100 MG/10ML IV SOLN
INTRAVENOUS | Status: AC
  Filled 2022-02-13: qty 10

## 2022-02-13 MED ORDER — PROPOFOL 10 MG/ML IV BOLUS
INTRAVENOUS | Status: DC | PRN
  Administered 2022-02-13: 10 mg via INTRAVENOUS
  Administered 2022-02-13 (×2): 20 mg via INTRAVENOUS
  Administered 2022-02-13: 30 mg via INTRAVENOUS
  Administered 2022-02-13 (×2): 20 mg via INTRAVENOUS

## 2022-02-13 MED ORDER — CHLORHEXIDINE GLUCONATE 0.12 % MT SOLN
15.0000 mL | Freq: Once | OROMUCOSAL | Status: AC
  Administered 2022-02-13: 15 mL via OROMUCOSAL

## 2022-02-13 MED ORDER — LIDOCAINE HCL (CARDIAC) PF 100 MG/5ML IV SOSY
PREFILLED_SYRINGE | INTRAVENOUS | Status: DC | PRN
  Administered 2022-02-13: 60 mg via INTRAVENOUS

## 2022-02-13 MED ORDER — PROPOFOL 10 MG/ML IV BOLUS
INTRAVENOUS | Status: AC
  Filled 2022-02-13: qty 20

## 2022-02-13 MED ORDER — PROPOFOL 500 MG/50ML IV EMUL
INTRAVENOUS | Status: AC
  Filled 2022-02-13: qty 50

## 2022-02-13 MED ORDER — LACTATED RINGERS IV SOLN: INTRAVENOUS | Status: DC

## 2022-02-13 SURGICAL SUPPLY — 19 items
BAG URO CATCHER STRL LF (MISCELLANEOUS) ×1 IMPLANT
BASKET ZERO TIP NITINOL 2.4FR (BASKET) IMPLANT
BSKT STON RTRVL ZERO TP 2.4FR (BASKET)
CATH SILICONE 14FRX5CC (CATHETERS) IMPLANT
CATH URETL OPEN END 6FR 70 (CATHETERS) IMPLANT
CLOTH BEACON ORANGE TIMEOUT ST (SAFETY) ×1 IMPLANT
DRSG TEGADERM 4X4.75 (GAUZE/BANDAGES/DRESSINGS) IMPLANT
GAUZE SPONGE 4X4 12PLY STRL (GAUZE/BANDAGES/DRESSINGS) IMPLANT
GLOVE SURG LX STRL 7.5 STRW (GLOVE) ×1 IMPLANT
GOWN STRL REUS W/ TWL XL LVL3 (GOWN DISPOSABLE) ×1 IMPLANT
GOWN STRL REUS W/TWL XL LVL3 (GOWN DISPOSABLE) ×1
GUIDEWIRE ANG ZIPWIRE 038X150 (WIRE) ×1 IMPLANT
GUIDEWIRE STR DUAL SENSOR (WIRE) IMPLANT
MANIFOLD NEPTUNE II (INSTRUMENTS) ×1 IMPLANT
PACK CYSTO (CUSTOM PROCEDURE TRAY) ×1 IMPLANT
STENT POLARIS 5FRX24 (STENTS) IMPLANT
TUBE FEEDING 8FR 16IN STR KANG (MISCELLANEOUS) IMPLANT
TUBING CONNECTING 10 (TUBING) ×1 IMPLANT
TUBING UROLOGY SET (TUBING) IMPLANT

## 2022-02-13 NOTE — Progress Notes (Signed)
PROGRESS NOTE    [name]  JSH:[id number] DOB: October 28, 1932 DOA: 02/12/2022 PCP: Mast, Man X, NP    Chief Complaint  Patient presents with   Back Pain    Brief Narrative: Patient is a pleasant 88 year old female with history of dementia, emphysema, seizure disorder no longer on antiepileptics presented to the ED with right-sided back pain.  Patient noted with complaints of dysuria and urinalysis at nursing facility compatible with UTI started on ciprofloxacin 1 day prior to admission cultures sent.  Due to complaints of back pain generalized weakness patient also noted to have a leukocytosis white count 7.3 patient sent to the ED.  Patient seen in the ED noted to be in a sinus tachycardia, CBC done with a leukocytosis white count of 19.1, elevated BUN to creatinine ratio.  CT abdomen and pelvis showed a obstructing stone in the right proximal ureter with moderate hydronephrosis and perinephric edema.  Urology consulted, urinalysis sent for cultures patient placed on IV Rocephin, IV fluids.  Patient taken to the OR by urology for renal decompression and right ureteral stent placement 02/13/2022.  Patient placed empirically on IV antibiotics pending culture results.   Assessment & Plan:   Principal Problem:   Obstructive pyelonephritis Active Problems:   Senile dementia (HCC)   Restless legs syndrome (RLS)   Panlobular emphysema (HCC)   Ureteral stone with hydronephrosis   Seizure ([place])   Dementia without behavioral disturbance ([place])  #1 obstructing ureteral stone with hydronephrosis/early obstructive pyelonephritis -Patient with complaints of back pain, noted to have a dysuria, recent diagnosis of UTI with cultures pending. -Patient noted to have a fever 102.1 on presentation. -Urinalysis done with moderate leukocytes, positive nitrites, WBCs > 50 .-Urine cultures pending.  Blood cultures pending. -CT abdomen and pelvis done with nonobstructing stone in the right proximal ureter  with moderate hydronephrosis and perinephric edema. -Patient seen in consultation by urology and underwent cystoscopy with right retrograde pyelogram and interpretation, right ureteral stent placement, suprapubic tube exchange per urology, Dr. Manny11/11/2021 -Increase IV Rocephin to 2 g daily and continue antibiotics pending culture results. -Supportive care.  2.  COPD -Stable. -Bronchodilators as needed.  3.  History of seizures -Seizure-free and off antiepileptics since August 2023. -Monitor.  4.  Dementia -Delirium precautions.   DVT prophylaxis: SCDs. Code Status: DNR Family Communication: Updated patient.  No family at bedside. Disposition: Likely back to SNF when clinically stable, improved medically, cleared by urology.  Status is: Inpatient Remains inpatient appropriate because: Severity of illness   Consultants:  Urology: [facility] 02/12/2022  Procedures:  Cystoscopy with right retrograde pyelogram and interpretation/right ureteral stent placement/suprapubic tube exchange per urology: Dr. Tresa Moore 02/13/2022 CT abdomen and pelvis 02/12/2022  Antimicrobials:  IV Rocephin 02/12/2022>>>>>   Subjective: Patient seen in PACU.  Alert.  Following commands appropriately.  Denies any chest pain or shortness of breath.  Stated prior to hospitalization had some nausea and some dry heaves with some associated right-sided lower back pain with some dysuria.  Objective: Vitals:   02/13/22 1130 02/13/22 1145 02/13/22 1200 02/13/22 1230  BP: '91/63 93/65 90/64 '$ 97/61  Pulse: 99 99 (!) 102 95  Resp: (!) 29 (!) 25 (!) 32 (!) 28  Temp:   99.8 F (37.7 C) 98.5 F (36.9 C)  TempSrc:    Oral  SpO2: 95% 99% 95% 92%  Weight:      Height:        Intake/Output Summary (Last 24 hours) at 02/13/2022 1641 Last data filed at 02/13/2022  1541 Gross per 24 hour  Intake 2980 ml  Output 200 ml  Net 2780 ml   Filed Weights   02/12/22 1725 02/13/22 0740  Weight: 65.8 kg 65.8 kg     Examination:  General exam: Appears calm and comfortable.  Dry mucous membranes. Respiratory system: Clear to auscultation.  No wheezes, no crackles, no rhonchi.  Fair air movement.  Respiratory effort normal. Cardiovascular system: S1 & S2 heard, RRR. No JVD, murmurs, rubs, gallops or clicks. No pedal edema. Gastrointestinal system: Abdomen is nondistended, soft and nontender. No organomegaly or masses felt. Normal bowel sounds heard.  Suprapubic catheter in place with yellowish urine noted in Foley bag. Central nervous system: Alert and oriented. No focal neurological deficits. Extremities: Symmetric 5 x 5 power. Skin: No rashes, lesions or ulcers Psychiatry: Judgement and insight appear normal. Mood & affect appropriate.     Data Reviewed: I have personally reviewed following labs and imaging studies  CBC: Recent Labs  Lab 02/12/22 1831 02/13/22 0543  WBC 19.1* 12.4*  HGB 12.8 11.6*  HCT 41.3 38.7  MCV 85.3 85.2  PLT 241 454    Basic Metabolic Panel: Recent Labs  Lab 02/12/22 1831 02/13/22 0543  NA 137 141  K 3.7 3.8  CL 109 112*  CO2 21* 22  GLUCOSE 114* 97  BUN 37* 36*  CREATININE 0.99 0.87  CALCIUM 8.7* 8.3*  MG  --  2.3    GFR: Estimated Creatinine Clearance: 41.3 mL/min (by C-G formula based on SCr of 0.87 mg/dL).  Liver Function Tests: No results for input(s): "AST", "ALT", "ALKPHOS", "BILITOT", "PROT", "ALBUMIN" in the last 168 hours.  CBG: Recent Labs  Lab 02/13/22 0939  GLUCAP 109*     Recent Results (from the past 240 hour(s))  Resp Panel by RT-PCR (Flu A&B, Covid) Anterior Nasal Swab     Status: None   Collection Time: 02/12/22  6:39 PM   Specimen: Anterior Nasal Swab  Result Value Ref Range Status   SARS Coronavirus 2 by RT PCR NEGATIVE NEGATIVE Final    Comment: (NOTE) SARS-CoV-2 target nucleic acids are NOT DETECTED.  The SARS-CoV-2 RNA is generally detectable in upper respiratory specimens during the acute phase of infection.  The lowest concentration of SARS-CoV-2 viral copies this assay can detect is 138 copies/mL. A negative result does not preclude SARS-Cov-2 infection and should not be used as the sole basis for treatment or other patient management decisions. A negative result may occur with  improper specimen collection/handling, submission of specimen other than nasopharyngeal swab, presence of viral mutation(s) within the areas targeted by this assay, and inadequate number of viral copies(<138 copies/mL). A negative result must be combined with clinical observations, patient history, and epidemiological information. The expected result is Negative.  Fact Sheet for Patients:  EntrepreneurPulse.com.au  Fact Sheet for Healthcare Providers:  IncredibleEmployment.be  This test is no t yet approved or cleared by the [place] FDA and  has been authorized for detection and/or diagnosis of SARS-CoV-2 by FDA under an Emergency Use Authorization (EUA). This EUA will remain  in effect (meaning this test can be used) for the duration of the COVID-19 declaration under Section 564(b)(1) of the Act, 21 U.S.C.section 360bbb-3(b)(1), unless the authorization is terminated  or revoked sooner.       Influenza A by PCR NEGATIVE NEGATIVE Final   Influenza B by PCR NEGATIVE NEGATIVE Final    Comment: (NOTE) The Xpert Xpress SARS-CoV-2/FLU/RSV plus assay is intended as an aid in the  diagnosis of influenza from Nasopharyngeal swab specimens and should not be used as a sole basis for treatment. Nasal washings and aspirates are unacceptable for Xpert Xpress SARS-CoV-2/FLU/RSV testing.  Fact Sheet for Patients: EntrepreneurPulse.com.au  Fact Sheet for Healthcare Providers: IncredibleEmployment.be  This test is not yet approved or cleared by the [place] FDA and has been authorized for detection and/or diagnosis of SARS-CoV-2 by FDA  under an Emergency Use Authorization (EUA). This EUA will remain in effect (meaning this test can be used) for the duration of the COVID-19 declaration under Section 564(b)(1) of the Act, 21 U.S.C. section 360bbb-3(b)(1), unless the authorization is terminated or revoked.  Performed at [facility], [place] [address], [place], [place] [address]          Radiology Studies: DG C-Arm 1-60 Min-No Report  Result Date: 02/13/2022 Fluoroscopy was utilized by the requesting physician.  No radiographic interpretation.   CT L-SPINE NO CHARGE  Result Date: 02/12/2022 CLINICAL DATA:  Back pain EXAM: CT LUMBAR SPINE WITHOUT CONTRAST TECHNIQUE: Multidetector CT imaging of the lumbar spine was performed without intravenous contrast administration. Multiplanar CT image reconstructions were also generated. RADIATION DOSE REDUCTION: This exam was performed according to the departmental dose-optimization program which includes automated exposure control, adjustment of the mA and/or kV according to patient size and/or use of iterative reconstruction technique. COMPARISON:  Prior dedicated CT of the lumbar spine, correlation is made with CT abdomen pelvis 10/20/2018 FINDINGS: Segmentation: 5 lumbar type vertebrae. Alignment: Scoliosis with dextroscoliosis of the thoracolumbar junction and compensatory levoscoliosis of the lower lumbar spine. Trace right lateral listhesis of L1 on L2 and 4 mm right lateral listhesis of L2 on L3. 4 mm left lateral listhesis of L4 on L5. Trace retrolisthesis of T12 on L1, L1 on L2, and L3 on L4. 5 mm anterolisthesis of L4 on L5, likely unchanged compared to 2017. Vertebrae: No acute fracture or suspicious osseous lesion. Compression deformity of T12 appears stable compared to 2017. Paraspinal and other soft tissues: Please see same-day CT abdomen pelvis. Disc levels: T12-L1: Trace retrolisthesis. Facet arthropathy. No spinal canal stenosis. Severe left neural foraminal  narrowing. L1-L2: Trace retrolisthesis and minimal disc bulge. Moderate facet arthropathy. No spinal canal stenosis. Mild left neural foraminal narrowing. L2-L3: Mild-to-moderate disc bulge. Moderate facet arthropathy. Mild spinal canal stenosis. Mild bilateral neural foraminal narrowing. L3-L4: Trace retrolisthesis. Mild disc bulge. Mild left and moderate right facet arthropathy. Mild spinal canal stenosis. Mild bilateral neural foraminal narrowing. L4-L5: Grade 1 anterolisthesis with disc unroofing. Moderate facet arthropathy. Ligamentum flavum hypertrophy. Moderate spinal canal stenosis. Moderate right neural foraminal narrowing. L5-S1: No significant disc bulge. Severe right right-greater-than-left facet arthropathy. No spinal canal stenosis. No neural foraminal narrowing. IMPRESSION: 1. No acute fracture or traumatic listhesis. 2. Scoliosis and multilevel degenerative changes, described in detail above. 3. L4-L5 moderate spinal canal stenosis and moderate right neural foraminal narrowing. 4. L2-L3 and L3-L4 mild spinal canal stenosis and mild bilateral neural foraminal narrowing. 5. T12-L1 severe left neural foraminal narrowing. Electronically Signed   By: Merilyn Baba M.D.   On: 02/12/2022 19:53   CT ABDOMEN PELVIS W CONTRAST  Result Date: 02/12/2022 CLINICAL DATA:  Acute abdominal pain. Back pain. Diagnosed with urinary tract infection yesterday. EXAM: CT ABDOMEN AND PELVIS WITH CONTRAST TECHNIQUE: Multidetector CT imaging of the abdomen and pelvis was performed using the standard protocol following bolus administration of intravenous contrast. RADIATION DOSE REDUCTION: This exam was performed according to the departmental dose-optimization program which includes automated exposure  control, adjustment of the mA and/or kV according to patient size and/or use of iterative reconstruction technique. CONTRAST:  148m OMNIPAQUE IOHEXOL 300 MG/ML  SOLN COMPARISON:  Abdominopelvic CT 10/20/2015. FINDINGS: Lower  chest: Emphysema. The thoracic aorta is tortuous. The previous pleural based left lower lobe nodule is obscured by atelectasis and small pleural effusion on the current exam. There is also a small right pleural effusion and atelectasis. Hepatobiliary: There is 6 mm hypodensity in the inferior right hepatic lobe that is too small to accurately characterize. Ill-defined low-density in the central liver at site of hemangioma on prior CT. Clips in the gallbladder fossa postcholecystectomy. No biliary dilatation. Pancreas: Scattered punctate calcifications suggesting chronic pancreatitis. There is no acute pancreatic inflammation. No ductal dilatation or pancreatic mass. Spleen: Normal in size without focal abnormality. Adrenals/Urinary Tract: Normal adrenal glands. There is an obstructing 12 x 16 mm stone in the right proximal ureter with moderate hydronephrosis and perinephric edema. Mild enhancement of the right renal pelvis and proximal ureter. The ureter distal to this is decompressed. Additional intrarenal calculi in both kidneys. There is no left hydronephrosis. No suspicious renal lesion. Urinary bladder is decompressed by suprapubic catheter, small amount of high-density material in the dependent bladder is of unknown significance, possible layering stones. Stomach/Bowel: Detailed bowel assessment is limited in the absence of enteric contrast. The stomach is nondistended. There is no evidence of small bowel obstruction or inflammation. Enteric sutures are noted within bowel loops in the pelvis. Moderate stool in the proximal colon, small volume of stool distally. Mild gaseous distension of transverse and proximal descending colon. No colonic inflammation. Vascular/Lymphatic: Moderate to advanced aortic atherosclerosis and tortuosity. No aneurysm. There is no bulky abdominopelvic adenopathy. Prominent portal caval node measuring 9 mm is not enlarged by size criteria. Reproductive: Status post hysterectomy. No  adnexal masses. Other: Presacral soft tissue thickening edema. Right perirenal stranding. No free air or ascites. Musculoskeletal: Scoliosis and diffuse degenerative change in the spine. Chronic T12 compression deformity. Postsurgical change in the left proximal femur. No acute osseous findings. IMPRESSION: 1. Obstructing 12 x 16 mm stone in the right proximal ureter with moderate hydronephrosis and perinephric edema. Mild enhancement of the right renal pelvis and proximal ureter, may be related to renal obstruction or concurrent urinary tract infection. 2. Additional intrarenal calculi in both kidneys. 3. Suprapubic catheter in the urinary bladder. Small amount of high-density material in the dependent bladder is of unknown significance, possible layering stones. 4. Additional stable chronic findings as described. Aortic Atherosclerosis (ICD10-I70.0) and Emphysema (ICD10-J43.9). Electronically Signed   By: MKeith RakeM.D.   On: 02/12/2022 19:46   DG Chest 2 View  Result Date: 02/12/2022 CLINICAL DATA:  Chest pain EXAM: CHEST - 2 VIEW COMPARISON:  Chest x-ray dated October 05, 2021 FINDINGS: Low lung volumes. Cardiac and mediastinal contours are within normal limits. Trace bilateral pleural effusions and left-greater-than-right PEs atelectasis no evidence of pneumothorax. Severe compression deformities of the lower thoracic spine, unchanged when compared with prior. IMPRESSION: Trace bilateral pleural effusions and left-greater-than-right basilar atelectasis. Electronically Signed   By: LYetta GlassmanM.D.   On: 02/12/2022 19:22        Scheduled Meds:  pramipexole  0.5 mg Oral QHS   saccharomyces boulardii  250 mg Oral BID   sodium chloride flush  3 mL Intravenous Q12H   Continuous Infusions:  sodium chloride 85 mL/hr at 02/13/22 0959   cefTRIAXone (ROCEPHIN)  IV       LOS: 1 day  Time spent: 45 minutes    Irine Seal, MD [facility]   To contact the attending  provider between 7A-7P or the covering provider during after hours 7P-7A, please log into the web site [email or web address] and access using universal [place] password for that web site. If you do not have the password, please call the hospital operator.  02/13/2022, 4:41 PM

## 2022-02-13 NOTE — Progress Notes (Addendum)
Patient came to short stay from emergency room with report of having sinus tach of around 102-107.  When patient came over to short stay patient was Sinus tach in 150s.  Patient had complaints of shortness of breath but denies chest pain. Patient placed on cardiac monitor with 2L o2.  Dr Zenia Resides with anesthesia came to bedside to assist RN.  Patients shortness of breath worsened, O2 saturation unable to read on monitor.  O2 sensor replaced and portable O2 sensor used.  Patient placed on non-rebreather mask.  O2 sats at 96 percent.  CRNA came to bedside to assist.  Metoprolol given by CRNA.  Patients HR came down to 130s. NRB removed and patient placed on 3L O2 with saturation at 96 percent. Patient taken back to OR with CRNA.    Dr Tresa Moore made aware and came to bedside

## 2022-02-13 NOTE — Brief Op Note (Signed)
02/12/2022 - 02/13/2022  8:17 AM  PATIENT:  [name]  88 y.o. female  PRE-OPERATIVE DIAGNOSIS:  RIGHT URETERAL STONE  POST-OPERATIVE DIAGNOSIS:  RIGHT URETERAL STONE  PROCEDURE:  Procedure(s): CYSTOSCOPY WITH RETROGRADE PYELOGRAM/URETERAL STENT PLACEMENT (Right)  SURGEON:  Surgeon(s) and Role:    * Alexis Frock, MD - Primary  PHYSICIAN ASSISTANT:   ASSISTANTS: none   ANESTHESIA:   MAC  EBL:  minimal   BLOOD ADMINISTERED:none  DRAINS:  SPT to graivty    LOCAL MEDICATIONS USED:  NONE  SPECIMEN:  No Specimen  DISPOSITION OF SPECIMEN:  N/A  COUNTS:  YES  TOURNIQUET:  * No tourniquets in log *  DICTATION: .Other Dictation: Dictation Number [id number]  PLAN OF CARE: Admit to inpatient   PATIENT DISPOSITION:  PACU - hemodynamically stable.   Delay start of Pharmacological VTE agent (>24hrs) due to surgical blood loss or risk of bleeding: not applicable

## 2022-02-13 NOTE — Op Note (Unsigned)
[name], [name] MEDICAL RECORD NO: [id number] ACCOUNT NO: [id number] DATE OF BIRTH: 02-Jun-1932 FACILITY: [facility] LOCATION: [facility] PHYSICIAN: Alexis Frock, MD  Operative Report   DATE OF PROCEDURE: 02/13/2022  SURGEON:  Alexis Frock, MD  PREOPERATIVE DIAGNOSIS:  Right ureteral stone with difficult colic and early infection.  PROCEDURE PERFORMED: 1.  Cystoscopy with right retrograde pyelogram and interpretation. 2.  Right ureteral stent placement. 3.  Suprapubic tube exchange.  ESTIMATED BLOOD LOSS:  Nil.  COMPLICATIONS:  None.  SPECIMEN:  None.  FINDINGS: 1.  Large proximal right ureteral stone with mild hydronephrosis, right ureteral tortuosity. 2.  Successful replacement of right ureteral stent, proximal end in upper pole, distal end in urinary bladder. 3.  Successful replacement of suprapubic tube 66-[id number] silicone type to straight drain.  INDICATIONS:  The patient is very mentally spry, but physically frail 88 year old lady with history of suprapubic tube from urethral stricture, who was found on workup of acute colicky flank pain to have a large right proximal ureteral stone by axial  imaging. With her chronic suprapubic tube, she does have some bacteriuria as anticipated.  She had some moderate infectious parameters without acute sepsis.  Options were discussed including recommended path of renal decompression in the semi-urgent  setting.  Options for renal decompression were discussed including stenting versus nephrostomy.  We both agreed on a trial of stenting. She wished to proceed.  Informed consent was obtained and placed in medical record.  Given her pulmonary comorbidity,  anesthesia staff and myself agreed that attempt to performing under MAC will be most prudent way.  PROCEDURE IN DETAIL:  The patient being [name] verified and procedure being cystoscopy with right stent placement was confirmed.  Procedure timeout was performed.  Intravenous antibiotics  were administered and verified. Current monitored anesthesia  care sedation was administered.  The patient was placed into a medium lithotomy position.  Sterile field was created, prepped and draped the patient's infraumbilical abdomen, vagina, introitus, and proximal thighs. After her in situ, SP tube was  removed.  Cystourethroscopy was then very carefully performed using 21-French rigid cystoscope.  There was some mild urethral narrowing, but it did accommodate the cystoscope.  Inspection of the bladder revealed no diverticula, calcifications, papillary  lesions.  Mild posterior wall erythema consistent with prior SP tube, but no overt neoplasia.  The right ureteral orifice was cannulated with a 6-French end-hole catheter and right retrograde pyelogram was obtained.  Right retrograde pyelogram demonstrated single right ureter, single system right kidney.  There was significant tortuosity of the ureter.  A large filling defect in the proximal ureter consistent with known stone.  A 0.038 ZIPwire was advanced to lower  pole and a new 5 x 24 Polaris type stent was placed using fluoroscopic guidance.  Good proximal and distal planes were noted.  Next, a new 01-UXNATF silicone catheter was placed along the suprapubic tract, 10 mL sterile water in the balloon and connected to straight  drain.  The procedure was terminated.  The patient tolerated the procedure well, no immediate perioperative complications.  The patient was taken to postanesthesia care unit in stable condition.   NIK D: 02/13/2022 8:21:41 am T: 02/13/2022 9:17:00 am  JOB: [id number]/ [id number]

## 2022-02-13 NOTE — Anesthesia Preprocedure Evaluation (Addendum)
Anesthesia Evaluation  Patient identified by MRN, date of birth, ID band Patient awake    Reviewed: Allergy & Precautions, NPO status , Patient's Chart, lab work & pertinent test results  History of Anesthesia Complications Negative for: history of anesthetic complications  Airway Mallampati: III       Dental  (+) Partial Upper   Pulmonary shortness of breath, COPD (prn inhalers, no oxygen), former smoker Multiple lung nodules   Pulmonary exam normal breath sounds clear to auscultation       Cardiovascular hypertension, (-) angina + CAD and + Peripheral Vascular Disease  (-) Past MI and (-) Cardiac Stents + dysrhythmias (1st degree AVB)  Rhythm:Regular Rate:Tachycardia  Low risks stress test 01/19/2015  TTE 01/19/2015: Study Conclusions   - Left ventricle: The cavity size was normal. There was moderate    concentric hypertrophy. Systolic function was vigorous. The    estimated ejection fraction was in the range of 65% to 70%. Wall    motion was normal; there were no regional wall motion    abnormalities. Doppler parameters are consistent with abnormal    left ventricular relaxation (grade 1 diastolic dysfunction).    Doppler parameters are consistent with elevated ventricular    end-diastolic filling pressure.  - Aortic valve: Trileaflet; mildly thickened, mildly calcified    leaflets. Transvalvular velocity was within the normal range.    There was no stenosis. There was trivial regurgitation.  - Aortic root: The aortic root was normal in size.  - Ascending aorta: The ascending aorta was normal in size.  - Mitral valve: Calcified annulus. Mildly thickened leaflets .  - Left atrium: The atrium was mildly dilated.  - Right ventricle: The cavity size was normal. Wall thickness was    normal. Systolic function was normal.  - Tricuspid valve: There was mild regurgitation.  - Pulmonic valve: There was trivial regurgitation.   - Pulmonary arteries: PA peak pressure: 35 mm Hg (S).  - Inferior vena cava: The vessel was normal in size. The    respirophasic diameter changes were in the normal range (= 50%),    consistent with normal central venous pressure.      Neuro/Psych Seizures -, Well Controlled,  PSYCHIATRIC DISORDERS  Depression   Dementia Essential tremor    GI/Hepatic Neg liver ROS,GERD  ,,  Endo/Other  neg diabetes  Thyroid nodule  Renal/GU Renal diseaseSuprapubic catheter     Musculoskeletal  (+) Arthritis ,    Abdominal  (+) - obese  Peds  Hematology negative hematology ROS (+)   Anesthesia Other Findings 88 y.o. female with medical history significant for dementia, emphysema, and seizure disorder no longer on antiepileptics, presenting to the emergency department with back pain.  Reproductive/Obstetrics                             Anesthesia Physical Anesthesia Plan  ASA: 3  Anesthesia Plan: MAC   Post-op Pain Management:    Induction:   PONV Risk Score and Plan: 2 and Ondansetron, Dexamethasone and Treatment may vary due to age or medical condition  Airway Management Planned: Natural Airway and Simple Face Mask  Additional Equipment:   Intra-op Plan:   Post-operative Plan:   Informed Consent: I have reviewed the patients History and Physical, chart, labs and discussed the procedure including the risks, benefits and alternatives for the proposed anesthesia with the patient or authorized representative who has indicated his/her understanding and acceptance.  Patient has DNR.  Discussed DNR with patient and Suspend DNR.   Dental advisory given  Plan Discussed with: Anesthesiologist and CRNA  Anesthesia Plan Comments: (Patient tachycardic to 150s in preop and started having SOB. Placed on oxygen. SpO2 mid-upper 90s. BP elevated. Suspected SOB was due to tachycardia. Tachycardia was ST, which was also seen on 12-lead ECG in preop. Esmolol  administered to bring down HR. HR improved. Patient breathing improved with improvement in HR. Due to resolution of patient's symptoms and concern that she is/can become septic, will proceed with procedure. )        Anesthesia Quick Evaluation

## 2022-02-13 NOTE — Progress Notes (Addendum)
[place] Room [address] [facility]) Hospitalized Hospice Patient  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 She was admitted to [facility] on 02/12/2022 for Back pain/Fever/possible UTI after [facility] received a positive UTI lab result and called EMS to transport patient to the hospital for evaluation. [facility] was notified by telephone of patient transport to [facility] ED. Per ACC MD, Dr. Cherie Ouch, this is a related admission.  Patient had Cystoscopy with retrograde pyelogram/ureteral stent placement and suprapubic tube exchange (right) this morning and tolerated procedure well per bedside RN. Visited patient at bedside who was smiling with no complaints of discomfort at this time, looking over lunch menu with HOB at 45 degrees on 2L O2 via [place]. Patient mentioned that the plan was to have another procedure to remove her kidney stone during this hospitalization to "get it all taken care of at once". Made patient aware that the [facility] Liaison team will continue to follow her daily while she remains at the hospital. Supported patient then encouraged her to reach out with any questions or concerns as needed.   Patient is GIP appropriate as she is recovering from procedure which requires frequent skilled care assessment and care, and patient is receiving IVPB antibiotics.  VS: 99.9, HR 101, RR 32, BP 99/66, Sat 97% on 2L O2 I/O: 1660/200 (+1460) Diagnostic: 11/7 - CT ad/pelvis showed 12 by 7m obstructing stone in right proximal ureter, CT spine: no acute fx, CXR Trace bil pleural effusion/basilar atelectasis Labs: WBC 12.4, Hemo 11.6, Chl 112, BUN 36, Cal 8.3, Glucose 109 IV/PRN: Rocephin 1 g in NS 1072mIVPB q24hrs, LR continuous IVF at 1048mhr   EPIC Problem List: 1 - Right Ureteral Stone - 78m23m prox ureteral sotne with mild hydro, few scattered small Rt renal  stones by ER CT 02/12/22 on eval Rt flank pain. Some luekocytosis and mildy tachycardia that improved with IVF. UA with some bacteruria as expected with SP Tube. Placed on empiric rocephin. No prior stones. Cr <1.   2 - Incomplete Bladder Emptying / Urethral Stricture / Chronic SP Tube - s/p SP Tube palcement 10/2021 for retention and partial urethral stricture. Now prefers for QOL.  Today "[name] seen to proceed with RIGHT ureteral stent placement for renal decompression from ureteral stone with mild infectious parameters. WBC down today, likey from hydration. She understands rationale behind staged approach with stenting and clearance of infectious parameters before definitive stone management.     GOC: Clear - DNR IDT: Updated [facility] [place] on patient status.  Family: Reached out via telephone unsuccessfully to patient sister Martha Jainsupport.  Discharge Planning: Ongoing: Once patient is medically stable, patient may continue hospice support back at [facility]. Upon discharge, please use [facility] for all transport needs for [facility] patients.  Transfer summary and Medication List place on shadow chart.  Please reach out with any hospice related concerns or questions,  SherGar Ponto [facility] [place] (on AMION/Epic Chat) [phone]

## 2022-02-13 NOTE — Progress Notes (Signed)
Day of Surgery   Subjective/Chief Complaint:   1 - Right Ureteral Stone - 4m Rt prox ureteral sotne with mild hydro, few scattered small Rt renal stones by ER CT 02/12/22 on eval Rt flank pain. Some luekocytosis and mildy tachycardia that improved with IVF. UA with some bacteruria as expected with SP Tube. Placed on empiric rocephin. No prior stones. Cr <1.    2 - Incomplete Bladder Emptying / Urethral Stricture / Chronic SP Tube - s/p SP Tube palcement 10/2021 for retention and partial urethral stricture. Now prefers for QOL.   Today "[name] is seen to proceed with RIGHT ureteral stent placement for renal decompression from ureteral stone with mild infectious parameters. WBC down today, likey from hydration.   Objective: Vital signs in last 24 hours: Temp:  [97.9 F (36.6 C)-99 F (37.2 C)] 98.1 F (36.7 C) (11/08 0605) Pulse Rate:  [93-124] 124 (11/08 0707) Resp:  [14-30] 17 (11/08 0707) BP: (86-133)/(56-100) 133/90 (11/08 0707) SpO2:  [93 %-97 %] 93 % (11/08 0707) Weight:  [65.8 kg] 65.8 kg (11/07 1725)    Intake/Output from previous day: 11/07 0701 - 11/08 0700 In: 1200 [IV Piggyback:1200] Out: -  Intake/Output this shift: No intake/output data recorded.  NAD, AOx3, stable frailty Non-labored breathing in RA SNTND Mild Rt CVAT SPT in place with non-foul urine No c/c/e  Lab Results:  Recent Labs    02/12/22 1831 02/13/22 0543  WBC 19.1* 12.4*  HGB 12.8 11.6*  HCT 41.3 38.7  PLT 241 217   BMET Recent Labs    02/12/22 1831 02/13/22 0543  NA 137 141  K 3.7 3.8  CL 109 112*  CO2 21* 22  GLUCOSE 114* 97  BUN 37* 36*  CREATININE 0.99 0.87  CALCIUM 8.7* 8.3*   PT/INR No results for input(s): "LABPROT", "INR" in the last 72 hours. ABG No results for input(s): "PHART", "HCO3" in the last 72 hours.  Invalid input(s): "PCO2", "PO2"  Studies/Results: CT L-SPINE NO CHARGE  Result Date: 02/12/2022 CLINICAL DATA:  Back pain EXAM: CT LUMBAR SPINE WITHOUT  CONTRAST TECHNIQUE: Multidetector CT imaging of the lumbar spine was performed without intravenous contrast administration. Multiplanar CT image reconstructions were also generated. RADIATION DOSE REDUCTION: This exam was performed according to the departmental dose-optimization program which includes automated exposure control, adjustment of the mA and/or kV according to patient size and/or use of iterative reconstruction technique. COMPARISON:  Prior dedicated CT of the lumbar spine, correlation is made with CT abdomen pelvis 10/20/2018 FINDINGS: Segmentation: 5 lumbar type vertebrae. Alignment: Scoliosis with dextroscoliosis of the thoracolumbar junction and compensatory levoscoliosis of the lower lumbar spine. Trace right lateral listhesis of L1 on L2 and 4 mm right lateral listhesis of L2 on L3. 4 mm left lateral listhesis of L4 on L5. Trace retrolisthesis of T12 on L1, L1 on L2, and L3 on L4. 5 mm anterolisthesis of L4 on L5, likely unchanged compared to 2017. Vertebrae: No acute fracture or suspicious osseous lesion. Compression deformity of T12 appears stable compared to 2017. Paraspinal and other soft tissues: Please see same-day CT abdomen pelvis. Disc levels: T12-L1: Trace retrolisthesis. Facet arthropathy. No spinal canal stenosis. Severe left neural foraminal narrowing. L1-L2: Trace retrolisthesis and minimal disc bulge. Moderate facet arthropathy. No spinal canal stenosis. Mild left neural foraminal narrowing. L2-L3: Mild-to-moderate disc bulge. Moderate facet arthropathy. Mild spinal canal stenosis. Mild bilateral neural foraminal narrowing. L3-L4: Trace retrolisthesis. Mild disc bulge. Mild left and moderate right facet arthropathy. Mild spinal canal stenosis. Mild  bilateral neural foraminal narrowing. L4-L5: Grade 1 anterolisthesis with disc unroofing. Moderate facet arthropathy. Ligamentum flavum hypertrophy. Moderate spinal canal stenosis. Moderate right neural foraminal narrowing. L5-S1: No  significant disc bulge. Severe right right-greater-than-left facet arthropathy. No spinal canal stenosis. No neural foraminal narrowing. IMPRESSION: 1. No acute fracture or traumatic listhesis. 2. Scoliosis and multilevel degenerative changes, described in detail above. 3. L4-L5 moderate spinal canal stenosis and moderate right neural foraminal narrowing. 4. L2-L3 and L3-L4 mild spinal canal stenosis and mild bilateral neural foraminal narrowing. 5. T12-L1 severe left neural foraminal narrowing. Electronically Signed   By: Merilyn Baba M.D.   On: 02/12/2022 19:53   CT ABDOMEN PELVIS W CONTRAST  Result Date: 02/12/2022 CLINICAL DATA:  Acute abdominal pain. Back pain. Diagnosed with urinary tract infection yesterday. EXAM: CT ABDOMEN AND PELVIS WITH CONTRAST TECHNIQUE: Multidetector CT imaging of the abdomen and pelvis was performed using the standard protocol following bolus administration of intravenous contrast. RADIATION DOSE REDUCTION: This exam was performed according to the departmental dose-optimization program which includes automated exposure control, adjustment of the mA and/or kV according to patient size and/or use of iterative reconstruction technique. CONTRAST:  113m OMNIPAQUE IOHEXOL 300 MG/ML  SOLN COMPARISON:  Abdominopelvic CT 10/20/2015. FINDINGS: Lower chest: Emphysema. The thoracic aorta is tortuous. The previous pleural based left lower lobe nodule is obscured by atelectasis and small pleural effusion on the current exam. There is also a small right pleural effusion and atelectasis. Hepatobiliary: There is 6 mm hypodensity in the inferior right hepatic lobe that is too small to accurately characterize. Ill-defined low-density in the central liver at site of hemangioma on prior CT. Clips in the gallbladder fossa postcholecystectomy. No biliary dilatation. Pancreas: Scattered punctate calcifications suggesting chronic pancreatitis. There is no acute pancreatic inflammation. No ductal  dilatation or pancreatic mass. Spleen: Normal in size without focal abnormality. Adrenals/Urinary Tract: Normal adrenal glands. There is an obstructing 12 x 16 mm stone in the right proximal ureter with moderate hydronephrosis and perinephric edema. Mild enhancement of the right renal pelvis and proximal ureter. The ureter distal to this is decompressed. Additional intrarenal calculi in both kidneys. There is no left hydronephrosis. No suspicious renal lesion. Urinary bladder is decompressed by suprapubic catheter, small amount of high-density material in the dependent bladder is of unknown significance, possible layering stones. Stomach/Bowel: Detailed bowel assessment is limited in the absence of enteric contrast. The stomach is nondistended. There is no evidence of small bowel obstruction or inflammation. Enteric sutures are noted within bowel loops in the pelvis. Moderate stool in the proximal colon, small volume of stool distally. Mild gaseous distension of transverse and proximal descending colon. No colonic inflammation. Vascular/Lymphatic: Moderate to advanced aortic atherosclerosis and tortuosity. No aneurysm. There is no bulky abdominopelvic adenopathy. Prominent portal caval node measuring 9 mm is not enlarged by size criteria. Reproductive: Status post hysterectomy. No adnexal masses. Other: Presacral soft tissue thickening edema. Right perirenal stranding. No free air or ascites. Musculoskeletal: Scoliosis and diffuse degenerative change in the spine. Chronic T12 compression deformity. Postsurgical change in the left proximal femur. No acute osseous findings. IMPRESSION: 1. Obstructing 12 x 16 mm stone in the right proximal ureter with moderate hydronephrosis and perinephric edema. Mild enhancement of the right renal pelvis and proximal ureter, may be related to renal obstruction or concurrent urinary tract infection. 2. Additional intrarenal calculi in both kidneys. 3. Suprapubic catheter in the  urinary bladder. Small amount of high-density material in the dependent bladder is of unknown significance, possible  layering stones. 4. Additional stable chronic findings as described. Aortic Atherosclerosis (ICD10-I70.0) and Emphysema (ICD10-J43.9). Electronically Signed   By: Keith Rake M.D.   On: 02/12/2022 19:46   DG Chest 2 View  Result Date: 02/12/2022 CLINICAL DATA:  Chest pain EXAM: CHEST - 2 VIEW COMPARISON:  Chest x-ray dated October 05, 2021 FINDINGS: Low lung volumes. Cardiac and mediastinal contours are within normal limits. Trace bilateral pleural effusions and left-greater-than-right PEs atelectasis no evidence of pneumothorax. Severe compression deformities of the lower thoracic spine, unchanged when compared with prior. IMPRESSION: Trace bilateral pleural effusions and left-greater-than-right basilar atelectasis. Electronically Signed   By: Yetta Glassman M.D.   On: 02/12/2022 19:22    Anti-infectives: Anti-infectives (From admission, onward)    Start     Dose/Rate Route Frequency Ordered Stop   02/13/22 2000  [MAR Hold]  cefTRIAXone (ROCEPHIN) 1 g in sodium chloride 0.9 % 100 mL IVPB        (MAR Hold since Wed 02/13/2022 at 0702.Hold Reason: Transfer to a Procedural area)   1 g 200 mL/hr over 30 Minutes Intravenous Every 24 hours 02/12/22 2200     02/12/22 1915  cefTRIAXone (ROCEPHIN) 1 g in sodium chloride 0.9 % 100 mL IVPB        1 g 200 mL/hr over 30 Minutes Intravenous  Once 02/12/22 1906 02/12/22 2057       Assessment/Plan:  Proceed as planned with RIGHT ureteral stent placement. Risks, benefits, alternatives, expected peri-op course discussed previously and reiterated today. She understands rationale behind staged approach with stenting and clearance of infectious parameters before definitive stone management.    Alexis Frock 02/13/2022

## 2022-02-13 NOTE — Anesthesia Postprocedure Evaluation (Signed)
Anesthesia Post Note  Patient: [name]  Procedure(s) Performed: CYSTOSCOPY WITH RETROGRADE PYELOGRAM/URETERAL STENT PLACEMENT, AND SUPRAPUBIC TUBE EXCHANGE (Right)     Patient location during evaluation: PACU Anesthesia Type: MAC Level of consciousness: awake and alert Pain management: pain level controlled Vital Signs Assessment: post-procedure vital signs reviewed and stable Respiratory status: spontaneous breathing, nonlabored ventilation, respiratory function stable and patient connected to nasal cannula oxygen Cardiovascular status: stable and blood pressure returned to baseline Postop Assessment: no apparent nausea or vomiting Anesthetic complications: no Comments: Patient states breathing has much improved. She is now febrile, suspecting sepsis. Blood cultures drawn. Tylenol given for fever.   No notable events documented.  Last Vitals:  Vitals:   02/13/22 0830 02/13/22 0900  BP: 109/70 105/74  Pulse: (!) 108 (!) 107  Resp: (!) 33 18  Temp:    SpO2: 96% 95%    Last Pain:  Vitals:   02/13/22 0830  TempSrc:   PainSc: 0-No pain                 Nilda Simmer

## 2022-02-13 NOTE — Transfer of Care (Signed)
Immediate Anesthesia Transfer of Care Note  Patient: [name]  Procedure(s) Performed: CYSTOSCOPY WITH RETROGRADE PYELOGRAM/URETERAL STENT PLACEMENT, AND SUPRAPUBIC TUBE EXCHANGE (Right)  Patient Location: PACU  Anesthesia Type:MAC  Level of Consciousness: awake, alert , and oriented  Airway & Oxygen Therapy: Patient Spontanous Breathing and Patient connected to nasal cannula oxygen  Post-op Assessment: Report given to RN and Post -op Vital signs reviewed and stable  Post vital signs: Reviewed and stable  Last Vitals:  Vitals Value Taken Time  BP 105/68 02/13/22 0823  Temp 38.9 C 02/13/22 0823  Pulse 108 02/13/22 0829  Resp 33 02/13/22 0829  SpO2 96 % 02/13/22 0829  Vitals shown include unvalidated device data.  Last Pain:  Vitals:   02/13/22 0740  TempSrc:   PainSc: 0-No pain      Patients Stated Pain Goal: 4 (56/38/75 6433)  Complications: No notable events documented.

## 2022-02-14 ENCOUNTER — Encounter (HOSPITAL_COMMUNITY): Payer: Self-pay | Admitting: Urology

## 2022-02-14 DIAGNOSIS — N132 Hydronephrosis with renal and ureteral calculous obstruction: Secondary | ICD-10-CM | POA: Diagnosis not present

## 2022-02-14 DIAGNOSIS — I471 Supraventricular tachycardia, unspecified: Secondary | ICD-10-CM

## 2022-02-14 DIAGNOSIS — N111 Chronic obstructive pyelonephritis: Secondary | ICD-10-CM | POA: Diagnosis not present

## 2022-02-14 DIAGNOSIS — J449 Chronic obstructive pulmonary disease, unspecified: Secondary | ICD-10-CM | POA: Diagnosis not present

## 2022-02-14 DIAGNOSIS — F0390 Unspecified dementia without behavioral disturbance: Secondary | ICD-10-CM | POA: Diagnosis not present

## 2022-02-14 LAB — BASIC METABOLIC PANEL
Anion gap: 7 (ref 5–15)
BUN: 34 mg/dL — ABNORMAL HIGH (ref 8–23)
CO2: 18 mmol/L — ABNORMAL LOW (ref 22–32)
Calcium: 7.9 mg/dL — ABNORMAL LOW (ref 8.9–10.3)
Chloride: 117 mmol/L — ABNORMAL HIGH (ref 98–111)
Creatinine, Ser: 0.7 mg/dL (ref 0.44–1.00)
GFR, Estimated: >60 mL/min (ref >60)
Glucose, Bld: 87 mg/dL (ref 70–99)
Potassium: 3.1 mmol/L — ABNORMAL LOW (ref 3.5–5.1)
Sodium: 142 mmol/L (ref 135–145)

## 2022-02-14 LAB — CBC
HCT: 32.6 % — ABNORMAL LOW (ref 36.0–46.0)
Hemoglobin: 9.9 g/dL — ABNORMAL LOW (ref 12.0–15.0)
MCH: 25.8 pg — ABNORMAL LOW (ref 26.0–34.0)
MCHC: 30.4 g/dL (ref 30.0–36.0)
MCV: 85.1 fL (ref 80.0–100.0)
Platelets: 218 10*3/uL (ref 150–400)
RBC: 3.83 MIL/uL — ABNORMAL LOW (ref 3.87–5.11)
RDW: 17 % — ABNORMAL HIGH (ref 11.5–15.5)
WBC: 11.7 10*3/uL — ABNORMAL HIGH (ref 4.0–10.5)
nRBC: 0 % (ref 0.0–0.2)

## 2022-02-14 LAB — GLUCOSE, CAPILLARY: Glucose-Capillary: 108 mg/dL — ABNORMAL HIGH (ref 70–99)

## 2022-02-14 LAB — MAGNESIUM: Magnesium: 2.1 mg/dL (ref 1.7–2.4)

## 2022-02-14 MED ORDER — POTASSIUM CHLORIDE CRYS ER 20 MEQ PO TBCR
40.0000 meq | EXTENDED_RELEASE_TABLET | Freq: Once | ORAL | Status: AC
  Administered 2022-02-14: 40 meq via ORAL
  Filled 2022-02-14: qty 2

## 2022-02-14 MED ORDER — CHLORHEXIDINE GLUCONATE CLOTH 2 % EX PADS
6.0000 | MEDICATED_PAD | Freq: Every day | CUTANEOUS | Status: DC
  Administered 2022-02-14 – 2022-02-16 (×3): 6 via TOPICAL

## 2022-02-14 MED ORDER — SODIUM CHLORIDE 0.9 % IV SOLN: INTRAVENOUS | Status: DC

## 2022-02-14 MED ORDER — SALINE SPRAY 0.65 % NA SOLN
1.0000 | NASAL | Status: DC | PRN
  Administered 2022-02-15: 1 via NASAL
  Filled 2022-02-14: qty 44

## 2022-02-14 NOTE — Progress Notes (Signed)
Central Tele captured SVT pt asymptomatic 140s - 160s 11.2 sec run. MD [place] Silos notified. Pt asymptomatic. See new order and full set vitals  EKG completed, hard copy in chart. Will continue with plan of care.

## 2022-02-14 NOTE — Progress Notes (Addendum)
[place] Room [address] [facility]) Hospitalized Hospice Patient   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 She was admitted to [facility] on 02/12/2022 for Back pain/Fever/possible UTI after [facility] received a positive UTI lab result and called EMS to transport patient to the hospital for evaluation. [facility] was notified by telephone of patient transport to [facility] ED. Per ACC MD, Dr. Cherie Bentley, this is a related admission.  Pt alert and oriented, eating lunch. States her appetite is not that great but she is feeling somewhat better. She denies any current pain and is interested to see what Dr. Tresa Bentley will have to say when he rounds on her.  She is currently inpatient appropriate for treatment of obstructive pyelonephritis requiring renal decompression and IV abx.  V/S: 98.4 oral, 104/71, HR 81, RR 18, SPO2 96% on RA I&O: 3289/1100 Labs: K+ 3.1, BUN 34, WBC 11.7, urine cx pending Diagnostics: 12 lead obtained, pt had a run of SVT, asymptomatic, 12 lead showed NSR with 1st degree AV block IV/PRN: rocephin 2g IV QD  Problem List: - hydronephrosis secondary to obstructing ureteral stone - stent placed on 11.8, WBCs trending down, rocephin IV QD, urine and blood cx pending - dementia - alert and oriented, able to make her needs known, under hospice services for this - run of SVT - 12 lead ~ 1st degree AV block, asymptomatic, K+ low and being repleted  D/C planning - return to [place] at d/c GOC - clear, DNR Family - sister coming by shortly, pt is keenly aware of what is going on and will update her sister as needed IDT - hospice team updated  At discharge, please use [facility] for transport.  Thank you, Martha Carbon DNP, RN [facility] Liaison

## 2022-02-14 NOTE — Progress Notes (Signed)
PROGRESS NOTE    [name]  SJG:[id number] DOB: 27-Oct-1932 DOA: 02/12/2022 PCP: Mast, Man X, NP    Chief Complaint  Patient presents with   Back Pain    Brief Narrative: Patient is a pleasant 88 year old female with history of dementia, emphysema, seizure disorder no longer on antiepileptics presented to the ED with right-sided back pain.  Patient noted with complaints of dysuria and urinalysis at nursing facility compatible with UTI started on ciprofloxacin 1 day prior to admission cultures sent.  Due to complaints of back pain generalized weakness patient also noted to have a leukocytosis white count 7.3 patient sent to the ED.  Patient seen in the ED noted to be in a sinus tachycardia, CBC done with a leukocytosis white count of 19.1, elevated BUN to creatinine ratio.  CT abdomen and pelvis showed a obstructing stone in the right proximal ureter with moderate hydronephrosis and perinephric edema.  Urology consulted, urinalysis sent for cultures patient placed on IV Rocephin, IV fluids.  Patient taken to the OR by urology for renal decompression and right ureteral stent placement 02/13/2022.  Patient placed empirically on IV antibiotics pending culture results.   Assessment & Plan:   Principal Problem:   Obstructive pyelonephritis Active Problems:   Senile dementia (HCC)   Restless legs syndrome (RLS)   Panlobular emphysema (HCC)   Ureteral stone with hydronephrosis   Seizure ([place])   Dementia without behavioral disturbance (HCC)   SVT (supraventricular tachycardia)  #1 obstructing ureteral stone with hydronephrosis/early obstructive pyelonephritis -Patient with complaints of back pain, noted to have a dysuria, recent diagnosis of UTI with cultures pending. -Patient noted to have a fever 102.1 on presentation. -Urinalysis done with moderate leukocytes, positive nitrites, WBCs > 50 .-Urine cultures pending.  Blood cultures pending. -CT abdomen and pelvis done with  nonobstructing stone in the right proximal ureter with moderate hydronephrosis and perinephric edema. -Patient seen in consultation by urology and underwent cystoscopy with right retrograde pyelogram and interpretation, right ureteral stent placement, suprapubic tube exchange per urology, Dr. Manny11/11/2021 -Urine cultures with multiple species present. -Continue IV Rocephin and can subsequently transition to oral antibiotics tomorrow to complete a 7 to 10-day course of treatment. -Supportive care.  2.  COPD -Stable. -Bronchodilators as needed.  3.  History of seizures -Seizure-free and off antiepileptics since August 2023. -Monitor.  4.  Dementia -Delirium precautions. -Being followed by hospice in the outpatient setting  5.  Run of SVT -RN patient noted to have a run of SVT of about 11.2 seconds heart rates in the 130s to the 140s, patient remained asymptomatic. -Check a EKG.  Check a TSH. -May consider trial of beta-blocker. -Outpatient follow-up.   DVT prophylaxis: SCDs. Code Status: DNR Family Communication: Updated patient.  No family at bedside. Disposition: Likely back to SNF when clinically stable, improved medically, cleared by urology.  Status is: Inpatient Remains inpatient appropriate because: Severity of illness   Consultants:  Urology: [facility] 02/12/2022  Procedures:  Cystoscopy with right retrograde pyelogram and interpretation/right ureteral stent placement/suprapubic tube exchange per urology: Dr. Tresa Moore 02/13/2022 CT abdomen and pelvis 02/12/2022  Antimicrobials:  IV Rocephin 02/12/2022>>>>>   Subjective: Laying in bed.  States ate oatmeal this morning.  Improvement with back pain.  No chest pain.  No shortness of breath.  No abdominal pain.  States [birth year].  Per RN patient with a run of SVT of about 11.2 seconds, in 130s to 140s, patient remained asymptomatic.  Objective: Vitals:   02/14/22  0112 02/14/22 0444 02/14/22 0600 02/14/22  1315  BP: 124/75 104/71  130/85  Pulse: 96 91  95  Resp: 19 18    Temp: 99 F (37.2 C) 98.4 F (36.9 C)    TempSrc: Oral Oral    SpO2: 93% 96%  97%  Weight:   73.1 kg   Height:        Intake/Output Summary (Last 24 hours) at 02/14/2022 1457 Last data filed at 02/14/2022 1443 Gross per 24 hour  Intake 1809.82 ml  Output 1102 ml  Net 707.82 ml   Filed Weights   02/12/22 1725 02/13/22 0740 02/14/22 0600  Weight: 65.8 kg 65.8 kg 73.1 kg    Examination:  General exam: NAD. Respiratory system: Clear to auscultation bilaterally.  No wheezes, no crackles, no rhonchi.  Fair air movement.  Speaking in full sentences.  Cardiovascular system: Regular rate and rhythm no murmurs rubs or gallops.  No JVD.  No lower extremity edema.  Gastrointestinal system: Abdomen is soft, nontender, nondistended, positive bowel sounds.  Suprapubic catheter in place with yellowish urine noted in Foley bag.  No CVA tenderness. Central nervous system: Alert and oriented.  Moving extremities spontaneously.  No focal neurological deficits. Extremities: Symmetric 5 x 5 power. Skin: No rashes, lesions or ulcers Psychiatry: Judgement and insight appear fair. Mood & affect appropriate.     Data Reviewed: I have personally reviewed following labs and imaging studies  CBC: Recent Labs  Lab 02/12/22 1831 02/13/22 0543 02/14/22 0425  WBC 19.1* 12.4* 11.7*  HGB 12.8 11.6* 9.9*  HCT 41.3 38.7 32.6*  MCV 85.3 85.2 85.1  PLT 241 217 761    Basic Metabolic Panel: Recent Labs  Lab 02/12/22 1831 02/13/22 0543 02/14/22 0425  NA 137 141 142  K 3.7 3.8 3.1*  CL 109 112* 117*  CO2 21* 22 18*  GLUCOSE 114* 97 87  BUN 37* 36* 34*  CREATININE 0.99 0.87 0.70  CALCIUM 8.7* 8.3* 7.9*  MG  --  2.3 2.1    GFR: Estimated Creatinine Clearance: 47.3 mL/min (by C-G formula based on SCr of 0.7 mg/dL).  Liver Function Tests: No results for input(s): "AST", "ALT", "ALKPHOS", "BILITOT", "PROT", "ALBUMIN" in the  last 168 hours.  CBG: Recent Labs  Lab 02/13/22 0939  GLUCAP 109*     Recent Results (from the past 240 hour(s))  Resp Panel by RT-PCR (Flu A&B, Covid) Anterior Nasal Swab     Status: None   Collection Time: 02/12/22  6:39 PM   Specimen: Anterior Nasal Swab  Result Value Ref Range Status   SARS Coronavirus 2 by RT PCR NEGATIVE NEGATIVE Final    Comment: (NOTE) SARS-CoV-2 target nucleic acids are NOT DETECTED.  The SARS-CoV-2 RNA is generally detectable in upper respiratory specimens during the acute phase of infection. The lowest concentration of SARS-CoV-2 viral copies this assay can detect is 138 copies/mL. A negative result does not preclude SARS-Cov-2 infection and should not be used as the sole basis for treatment or other patient management decisions. A negative result may occur with  improper specimen collection/handling, submission of specimen other than nasopharyngeal swab, presence of viral mutation(s) within the areas targeted by this assay, and inadequate number of viral copies(<138 copies/mL). A negative result must be combined with clinical observations, patient history, and epidemiological information. The expected result is Negative.  Fact Sheet for Patients:  EntrepreneurPulse.com.au  Fact Sheet for Healthcare Providers:  IncredibleEmployment.be  This test is no t yet approved or cleared by  the [employer] and  has been authorized for detection and/or diagnosis of SARS-CoV-2 by FDA under an Emergency Use Authorization (EUA). This EUA will remain  in effect (meaning this test can be used) for the duration of the COVID-19 declaration under Section 564(b)(1) of the Act, 21 U.S.C.section 360bbb-3(b)(1), unless the authorization is terminated  or revoked sooner.       Influenza A by PCR NEGATIVE NEGATIVE Final   Influenza B by PCR NEGATIVE NEGATIVE Final    Comment: (NOTE) The Xpert Xpress SARS-CoV-2/FLU/RSV plus  assay is intended as an aid in the diagnosis of influenza from Nasopharyngeal swab specimens and should not be used as a sole basis for treatment. Nasal washings and aspirates are unacceptable for Xpert Xpress SARS-CoV-2/FLU/RSV testing.  Fact Sheet for Patients: EntrepreneurPulse.com.au  Fact Sheet for Healthcare Providers: IncredibleEmployment.be  This test is not yet approved or cleared by the [place] FDA and has been authorized for detection and/or diagnosis of SARS-CoV-2 by FDA under an Emergency Use Authorization (EUA). This EUA will remain in effect (meaning this test can be used) for the duration of the COVID-19 declaration under Section 564(b)(1) of the Act, 21 U.S.C. section 360bbb-3(b)(1), unless the authorization is terminated or revoked.  Performed at [facility], [place] [address] [place], [place] [address]   Urine Culture     Status: Abnormal   Collection Time: 02/12/22  6:39 PM   Specimen: Urine, Clean Catch  Result Value Ref Range Status   Specimen Description   Final    URINE, CLEAN CATCH Performed at [facility], [place] [address], [place], [place] [address]    Special Requests   Final    NONE Performed at [facility], [place] [address], [place], [place] [address]    Culture MULTIPLE SPECIES PRESENT, SUGGEST RECOLLECTION (A)  Final   Report Status 02/13/2022 FINAL  Final  Culture, blood (single) w Reflex to ID Panel     Status: None (Preliminary result)   Collection Time: 02/13/22  8:58 AM   Specimen: BLOOD RIGHT HAND  Result Value Ref Range Status   Specimen Description   Final    BLOOD RIGHT HAND Performed at [place] [address], [place], [place] [address]    Special Requests   Final    BOTTLES DRAWN AEROBIC ONLY Blood Culture results may not be optimal due to an inadequate volume of blood received in culture bottles Performed at  [place] [address], [place], [place] [address]    Culture   Final    NO GROWTH 1 DAY Performed at [facility], [place] [address], [place], [place] [address]    Report Status PENDING  Incomplete  Culture, blood (single) w Reflex to ID Panel     Status: None (Preliminary result)   Collection Time: 02/13/22 12:45 PM   Specimen: BLOOD LEFT HAND  Result Value Ref Range Status   Specimen Description   Final    BLOOD LEFT HAND Performed at [facility] [address] [place], [place] [address]    Special Requests   Final    BOTTLES DRAWN AEROBIC ONLY Blood Culture adequate volume Performed at [place] [address], [place], [place] [address]    Culture   Final    NO GROWTH < 24 HOURS Performed at [facility] [address], [place],  [address]    Report Status PENDING  Incomplete         Radiology Studies: DG C-Arm 1-60 Min-No Report  Result Date: 02/13/2022 Fluoroscopy was utilized by the requesting physician.  No radiographic interpretation.   CT L-SPINE NO CHARGE  Result Date: 02/12/2022 CLINICAL DATA:  Back pain EXAM: CT LUMBAR SPINE WITHOUT CONTRAST TECHNIQUE: Multidetector CT imaging of the lumbar spine was performed without intravenous contrast administration. Multiplanar CT image reconstructions were also generated. RADIATION DOSE REDUCTION: This exam was performed according to the departmental dose-optimization program which includes automated exposure control, adjustment of the mA and/or kV according to patient size and/or use of iterative reconstruction technique. COMPARISON:  Prior dedicated CT of the lumbar spine, correlation is made with CT abdomen pelvis 10/20/2018 FINDINGS: Segmentation: 5 lumbar type vertebrae. Alignment: Scoliosis with dextroscoliosis of the thoracolumbar junction and compensatory levoscoliosis of the lower lumbar spine. Trace right lateral listhesis of L1 on  L2 and 4 mm right lateral listhesis of L2 on L3. 4 mm left lateral listhesis of L4 on L5. Trace retrolisthesis of T12 on L1, L1 on L2, and L3 on L4. 5 mm anterolisthesis of L4 on L5, likely unchanged compared to 2017. Vertebrae: No acute fracture or suspicious osseous lesion. Compression deformity of T12 appears stable compared to 2017. Paraspinal and other soft tissues: Please see same-day CT abdomen pelvis. Disc levels: T12-L1: Trace retrolisthesis. Facet arthropathy. No spinal canal stenosis. Severe left neural foraminal narrowing. L1-L2: Trace retrolisthesis and minimal disc bulge. Moderate facet arthropathy. No spinal canal stenosis. Mild left neural foraminal narrowing. L2-L3: Mild-to-moderate disc bulge. Moderate facet arthropathy. Mild spinal canal stenosis. Mild bilateral neural foraminal narrowing. L3-L4: Trace retrolisthesis. Mild disc bulge. Mild left and moderate right facet arthropathy. Mild spinal canal stenosis. Mild bilateral neural foraminal narrowing. L4-L5: Grade 1 anterolisthesis with disc unroofing. Moderate facet arthropathy. Ligamentum flavum hypertrophy. Moderate spinal canal stenosis. Moderate right neural foraminal narrowing. L5-S1: No significant disc bulge. Severe right right-greater-than-left facet arthropathy. No spinal canal stenosis. No neural foraminal narrowing. IMPRESSION: 1. No acute fracture or traumatic listhesis. 2. Scoliosis and multilevel degenerative changes, described in detail above. 3. L4-L5 moderate spinal canal stenosis and moderate right neural foraminal narrowing. 4. L2-L3 and L3-L4 mild spinal canal stenosis and mild bilateral neural foraminal narrowing. 5. T12-L1 severe left neural foraminal narrowing. Electronically Signed   By: Merilyn Baba M.D.   On: 02/12/2022 19:53   CT ABDOMEN PELVIS W CONTRAST  Result Date: 02/12/2022 CLINICAL DATA:  Acute abdominal pain. Back pain. Diagnosed with urinary tract infection yesterday. EXAM: CT ABDOMEN AND PELVIS WITH  CONTRAST TECHNIQUE: Multidetector CT imaging of the abdomen and pelvis was performed using the standard protocol following bolus administration of intravenous contrast. RADIATION DOSE REDUCTION: This exam was performed according to the departmental dose-optimization program which includes automated exposure control, adjustment of the mA and/or kV according to patient size and/or use of iterative reconstruction technique. CONTRAST:  180m OMNIPAQUE IOHEXOL 300 MG/ML  SOLN COMPARISON:  Abdominopelvic CT 10/20/2015. FINDINGS: Lower chest: Emphysema. The thoracic aorta is tortuous. The previous pleural based left lower lobe nodule is obscured by atelectasis and small pleural effusion on the current exam. There is also a small right pleural effusion and atelectasis. Hepatobiliary: There is 6 mm hypodensity in the inferior right hepatic lobe that is too small to accurately characterize. Ill-defined low-density in the central liver at site of hemangioma on prior CT. Clips in the gallbladder fossa postcholecystectomy. No biliary dilatation. Pancreas: Scattered punctate calcifications suggesting chronic pancreatitis. There is no acute pancreatic inflammation.  No ductal dilatation or pancreatic mass. Spleen: Normal in size without focal abnormality. Adrenals/Urinary Tract: Normal adrenal glands. There is an obstructing 12 x 16 mm stone in the right proximal ureter with moderate hydronephrosis and perinephric edema. Mild enhancement of the right renal pelvis and proximal ureter. The ureter distal to this is decompressed. Additional intrarenal calculi in both kidneys. There is no left hydronephrosis. No suspicious renal lesion. Urinary bladder is decompressed by suprapubic catheter, small amount of high-density material in the dependent bladder is of unknown significance, possible layering stones. Stomach/Bowel: Detailed bowel assessment is limited in the absence of enteric contrast. The stomach is nondistended. There is no  evidence of small bowel obstruction or inflammation. Enteric sutures are noted within bowel loops in the pelvis. Moderate stool in the proximal colon, small volume of stool distally. Mild gaseous distension of transverse and proximal descending colon. No colonic inflammation. Vascular/Lymphatic: Moderate to advanced aortic atherosclerosis and tortuosity. No aneurysm. There is no bulky abdominopelvic adenopathy. Prominent portal caval node measuring 9 mm is not enlarged by size criteria. Reproductive: Status post hysterectomy. No adnexal masses. Other: Presacral soft tissue thickening edema. Right perirenal stranding. No free air or ascites. Musculoskeletal: Scoliosis and diffuse degenerative change in the spine. Chronic T12 compression deformity. Postsurgical change in the left proximal femur. No acute osseous findings. IMPRESSION: 1. Obstructing 12 x 16 mm stone in the right proximal ureter with moderate hydronephrosis and perinephric edema. Mild enhancement of the right renal pelvis and proximal ureter, may be related to renal obstruction or concurrent urinary tract infection. 2. Additional intrarenal calculi in both kidneys. 3. Suprapubic catheter in the urinary bladder. Small amount of high-density material in the dependent bladder is of unknown significance, possible layering stones. 4. Additional stable chronic findings as described. Aortic Atherosclerosis (ICD10-I70.0) and Emphysema (ICD10-J43.9). Electronically Signed   By: Keith Rake M.D.   On: 02/12/2022 19:46   DG Chest 2 View  Result Date: 02/12/2022 CLINICAL DATA:  Chest pain EXAM: CHEST - 2 VIEW COMPARISON:  Chest x-ray dated October 05, 2021 FINDINGS: Low lung volumes. Cardiac and mediastinal contours are within normal limits. Trace bilateral pleural effusions and left-greater-than-right PEs atelectasis no evidence of pneumothorax. Severe compression deformities of the lower thoracic spine, unchanged when compared with prior. IMPRESSION: Trace  bilateral pleural effusions and left-greater-than-right basilar atelectasis. Electronically Signed   By: Yetta Glassman M.D.   On: 02/12/2022 19:22        Scheduled Meds:  Chlorhexidine Gluconate Cloth  6 each Topical Q0600   pramipexole  0.5 mg Oral QHS   saccharomyces boulardii  250 mg Oral BID   sodium chloride flush  3 mL Intravenous Q12H   Continuous Infusions:  cefTRIAXone (ROCEPHIN)  IV 2 g (02/13/22 2100)     LOS: 2 days    Time spent: 40 minutes    Irine Seal, MD [facility]   To contact the attending provider between 7A-7P or the covering provider during after hours 7P-7A, please log into the web site [email or web address] and access using universal [place] password for that web site. If you do not have the password, please call the hospital operator.  02/14/2022, 2:57 PM

## 2022-02-14 NOTE — TOC Initial Note (Signed)
Transition of Care [facility]) - Initial/Assessment Note    Patient Details  Name: [name] MRN: [id number] Date of Birth: 06-27-1932  Transition of Care [facility]) CM/SW Contact:    Dessa Phi, RN Phone Number: 02/14/2022, 12:20 PM  Clinical Narrative: From [place] [facility] to return [facility] @ d/c.                  Expected Discharge Plan: Long Term Nursing Home Barriers to Discharge: Continued Medical Work up   Patient Goals and CMS Choice Patient states their goals for this hospitalization and ongoing recovery are::  (Return back to [place] w/[facility]) CMS Medicare.gov Compare Post Acute Care list provided to:: Patient Represenative (must comment) Choice offered to / list presented to : Sibling  Expected Discharge Plan and Services Expected Discharge Plan: [place]   Discharge Planning Services: CM Consult Post Acute Care Choice: Resumption of Svcs/PTA Provider (LTC w/hospice) Living arrangements for the past 2 months:  (LTC)                                      Prior Living Arrangements/Services Living arrangements for the past 2 months:  (LTC) Lives with:: Facility Resident Patient language and need for interpreter reviewed:: Yes Do you feel safe going back to the place where you live?: Yes      Need for Family Participation in Patient Care: Yes (Comment) Care giver support system in place?: Yes (comment)   Criminal Activity/Legal Involvement Pertinent to Current Situation/Hospitalization: No - Comment as needed  Activities of Daily Living Home Assistive Devices/Equipment: Wheelchair, Eyeglasses, Dentures (specify type) (full lower, partial uppper, supra pubic catheter pt thinks) ADL Screening (condition at time of admission) Patient's cognitive ability adequate to safely complete daily activities?: Yes Is the patient deaf or have difficulty hearing?: No Does the patient have  difficulty seeing, even when wearing glasses/contacts?: No Does the patient have difficulty concentrating, remembering, or making decisions?: Yes ("slight") Patient able to express need for assistance with ADLs?: Yes Does the patient have difficulty dressing or bathing?: Yes Independently performs ADLs?: No Communication: Independent Dressing (OT): Needs assistance Is this a change from baseline?: Pre-admission baseline Grooming: Independent Feeding: Independent Bathing: Needs assistance Is this a change from baseline?: Pre-admission baseline Toileting: Needs assistance Is this a change from baseline?: Pre-admission baseline In/Out Bed: Needs assistance Is this a change from baseline?: Pre-admission baseline Walks in Home: Dependent Is this a change from baseline?: Pre-admission baseline Does the patient have difficulty walking or climbing stairs?: Yes Weakness of Legs: Both Weakness of Arms/Hands: Both  Permission Sought/Granted Permission sought to share information with : Case Manager Permission granted to share information with : Yes, Verbal Permission Granted  Share Information with NAME:  (Case manager)           Emotional Assessment Appearance:: Appears stated age Attitude/Demeanor/Rapport: Gracious Affect (typically observed): Accepting Orientation: : Oriented to Self Alcohol / Substance Use: Not Applicable Psych Involvement: No (comment)  Admission diagnosis:  Obstructive pyelonephritis [N11.1] Ureteral stone with hydronephrosis [N13.2] Patient Active Problem List   Diagnosis Date Noted   Ureteral stone with hydronephrosis 02/13/2022   Seizure ([place]) 02/13/2022   Dementia without behavioral disturbance ([place]) 02/13/2022   Leukocytosis 02/12/2022   Obstructive pyelonephritis 02/12/2022   Allergic rhinitis 02/04/2022   Dysuria 01/22/2022   Cellulitis of skin 01/01/2022   Cellulitis of left leg  without foot 12/14/2021   Pressure ulcer, stage 2 ([place]) 12/04/2021    Adult failure to thrive 11/09/2021   Suprapubic catheter ([place]) 11/09/2021   Hypokalemia 11/09/2021   Major depressive disorder, recurrent ([place]) 11/04/2021   Cognitive and behavioral changes 11/04/2021   Closed left hip fracture ([place]) 08/29/2021   Heart block AV first degree 08/29/2021   Skin irritation 07/12/2021   History of retinal detachment 02/20/2021   Fuchs' corneal dystrophy of both eyes 02/20/2021   Choroidal nevus of right eye 02/20/2021   Pseudophakia, both eyes 02/20/2021   Dry skin dermatitis 01/29/2021   COVID-19 virus infection 09/25/2020   GERD (gastroesophageal reflux disease) 04/28/2020   Nondisplaced transverse fracture of left patella, subsequent encounter for closed fracture with nonunion 04/28/2020   Generalized osteoarthritis of multiple sites 10/05/2019   Olecranon bursitis of right elbow 10/04/2019   PAD (peripheral artery disease) ([place]) 06/23/2019   Slow transit constipation 05/21/2019   Generalized weakness 05/19/2019   Vasovagal near syncope 05/19/2019   History of urinary retention 05/19/2019   Mandibular fracture, closed ([place]) 05/19/2019   Humerus fracture 05/04/2019   Right thyroid nodule 02/28/2016   Abnormal thyroid uptake 11/10/2015   Coronary artery calcification 12/05/2014   Panlobular emphysema ([place]) 07/25/2014   Dyspnea and respiratory abnormality 07/25/2014   Multiple lung nodules on CT 07/25/2014   Smoking history 07/25/2014   Pain of right lower leg 05/23/2014   Fall at home, initial encounter 05/23/2014   Gait disorder 04/25/2014   Senile dementia ([place]) 04/25/2014   Essential tremor 04/25/2014   Advanced directives, counseling/discussion 02/18/2013   Seizure disorder ([place]) 10/15/2012   Cancer of skin, squamous cell 10/15/2012   Unspecified vitamin D deficiency 10/15/2012   Hyperlipidemia 10/15/2012   Edema 09/10/2012   Hordeolum externum (stye) 09/10/2012   Restless legs syndrome (RLS) 09/12/2011   Insomnia, unspecified  09/12/2011   COPD (chronic obstructive pulmonary disease) ([place]) 05/23/2011   Essential hypertension 01/31/2011   Senile osteoporosis 01/31/2011   Depression, psychotic ([place]) 01/31/2011   PCP:  Mast, Man X, NP Pharmacy:   [place], [place] [address] Phone: [phone] Fax: [place] [id number] [place], [place] [address] Phone: [phone] Fax: [phone]  [facility] [place], [place] - [address] AT [address] [place] [address] Phone: [phone] Fax: [phone]     Social Determinants of Health (SDOH) Interventions    Readmission Risk Interventions     No data to display

## 2022-02-14 NOTE — NC FL2 (Signed)
[place] LEVEL OF CARE SCREENING TOOL     IDENTIFICATION  Patient Name: [name] Birthdate: [birth year] Sex: female Admission Date (Current Location): 02/12/2022  [facility] and [place] Number:  Herbalist and Address:  [facility],  [place], [place]      Provider Number: [id number]  Attending Physician Name and Address:  Eugenie Filler, MD  Relative Name and Phone Number:   ([place] [phone])    Current Level of Care: Hospital Recommended Level of Care: Nursing Facility Prior Approval Number:    Date Approved/Denied:   PASRR Number:    Discharge Plan: Other (Comment) ([name]-LTC w/Hospice)    Current Diagnoses: Patient Active Problem List   Diagnosis Date Noted   Ureteral stone with hydronephrosis 02/13/2022   Seizure ([place]) 02/13/2022   Dementia without behavioral disturbance ([place]) 02/13/2022   Leukocytosis 02/12/2022   Obstructive pyelonephritis 02/12/2022   Allergic rhinitis 02/04/2022   Dysuria 01/22/2022   Cellulitis of skin 01/01/2022   Cellulitis of left leg without foot 12/14/2021   Pressure ulcer, stage 2 ([place]) 12/04/2021   Adult failure to thrive 11/09/2021   Suprapubic catheter ([place]) 11/09/2021   Hypokalemia 11/09/2021   Major depressive disorder, recurrent ([place]) 11/04/2021   Cognitive and behavioral changes 11/04/2021   Closed left hip fracture ([place]) 08/29/2021   Heart block AV first degree 08/29/2021   Skin irritation 07/12/2021   History of retinal detachment 02/20/2021   Fuchs' corneal dystrophy of both eyes 02/20/2021   Choroidal nevus of right eye 02/20/2021   Pseudophakia, both eyes 02/20/2021   Dry skin dermatitis 01/29/2021   COVID-19 virus infection 09/25/2020   GERD (gastroesophageal reflux disease) 04/28/2020   Nondisplaced transverse fracture of left patella, subsequent encounter for closed fracture with nonunion 04/28/2020   Generalized  osteoarthritis of multiple sites 10/05/2019   Olecranon bursitis of right elbow 10/04/2019   PAD (peripheral artery disease) ([place]) 06/23/2019   Slow transit constipation 05/21/2019   Generalized weakness 05/19/2019   Vasovagal near syncope 05/19/2019   History of urinary retention 05/19/2019   Mandibular fracture, closed ([place]) 05/19/2019   Humerus fracture 05/04/2019   Right thyroid nodule 02/28/2016   Abnormal thyroid uptake 11/10/2015   Coronary artery calcification 12/05/2014   Panlobular emphysema ([place]) 07/25/2014   Dyspnea and respiratory abnormality 07/25/2014   Multiple lung nodules on CT 07/25/2014   Smoking history 07/25/2014   Pain of right lower leg 05/23/2014   Fall at home, initial encounter 05/23/2014   Gait disorder 04/25/2014   Senile dementia ([place]) 04/25/2014   Essential tremor 04/25/2014   Advanced directives, counseling/discussion 02/18/2013   Seizure disorder ([place]) 10/15/2012   Cancer of skin, squamous cell 10/15/2012   Unspecified vitamin D deficiency 10/15/2012   Hyperlipidemia 10/15/2012   Edema 09/10/2012   Hordeolum externum (stye) 09/10/2012   Restless legs syndrome (RLS) 09/12/2011   Insomnia, unspecified 09/12/2011   COPD (chronic obstructive pulmonary disease) ([place]) 05/23/2011   Essential hypertension 01/31/2011   Senile osteoporosis 01/31/2011   Depression, psychotic ([place]) 01/31/2011    Orientation RESPIRATION BLADDER Height & Weight     Self, Situation  Normal Urostomy (suprapubic catheter) Weight: 73.1 kg Height:  '5\' 2"'$  (157.5 cm)  BEHAVIORAL SYMPTOMS/MOOD NEUROLOGICAL BOWEL NUTRITION STATUS      Continent Diet (Regular)  AMBULATORY STATUS COMMUNICATION OF NEEDS Skin   Extensive Assist Verbally Normal  Personal Care Assistance Level of Assistance  Bathing, Feeding, Dressing Bathing Assistance: Maximum assistance Feeding assistance: Limited assistance Dressing Assistance: Maximum assistance     Functional  Limitations Info  Sight, Hearing, Speech Sight Info: Impaired (eyeglasses) Hearing Info: Adequate Speech Info: Adequate    SPECIAL CARE FACTORS FREQUENCY                       Contractures Contractures Info: Not present    Additional Factors Info  Code Status, Allergies Code Status Info:  (DNR) Allergies Info:  (Vioxx (Rofecoxib), Dyazide (Hydrochlorothiazide W-triamterene), Latex, Sulfa Antibiotics, Sumycin (Tetracycline))           Current Medications (02/14/2022):  This is the current hospital active medication list Current Facility-Administered Medications  Medication Dose Route Frequency Provider Last Rate Last Admin   acetaminophen (TYLENOL) tablet 650 mg  650 mg Oral Q6H PRN Opyd, Ilene Qua, MD       Or   acetaminophen (TYLENOL) suppository 650 mg  650 mg Rectal Q6H PRN Opyd, Ilene Qua, MD       albuterol (PROVENTIL) (2.5 MG/3ML) 0.083% nebulizer solution 2.5 mg  2.5 mg Nebulization Daily PRN Opyd, Ilene Qua, MD       cefTRIAXone (ROCEPHIN) 2 g in sodium chloride 0.9 % 100 mL IVPB  2 g Intravenous Q24H Eugenie Filler, MD 200 mL/hr at 02/13/22 2100 2 g at 02/13/22 2100   Chlorhexidine Gluconate Cloth 2 % PADS 6 each  6 each Topical Q0600 Eugenie Filler, MD   6 each at 02/14/22 1042   fentaNYL (SUBLIMAZE) injection 12.5-50 mcg  12.5-50 mcg Intravenous Q2H PRN Opyd, Ilene Qua, MD       ondansetron (ZOFRAN) tablet 4 mg  4 mg Oral Q6H PRN Opyd, Ilene Qua, MD       Or   ondansetron (ZOFRAN) injection 4 mg  4 mg Intravenous Q6H PRN Opyd, Ilene Qua, MD       pramipexole (MIRAPEX) tablet 0.5 mg  0.5 mg Oral QHS Opyd, Ilene Qua, MD   0.5 mg at 02/13/22 2122   saccharomyces boulardii (FLORASTOR) capsule 250 mg  250 mg Oral BID Opyd, Ilene Qua, MD   250 mg at 02/14/22 0920   sodium chloride (OCEAN) 0.65 % nasal spray 1 spray  1 spray Each Nare PRN Eugenie Filler, MD       sodium chloride flush (NS) 0.9 % injection 3 mL  3 mL Intravenous Q12H Opyd, Ilene Qua, MD   3  mL at 02/13/22 2121     Discharge Medications: Please see discharge summary for a list of discharge medications.  Relevant Imaging Results:  Relevant Lab Results:   Additional Information  [phone])  Antwaun Buth, Juliann Pulse, RN

## 2022-02-14 NOTE — Plan of Care (Signed)
  Problem: Clinical Measurements: Goal: Will remain free from infection Outcome: Progressing Goal: Diagnostic test results will improve Outcome: Progressing   Problem: Coping: Goal: Level of anxiety will decrease Outcome: Progressing   Problem: Safety: Goal: Ability to remain free from injury will improve Outcome: Progressing

## 2022-02-15 DIAGNOSIS — N132 Hydronephrosis with renal and ureteral calculous obstruction: Secondary | ICD-10-CM | POA: Diagnosis not present

## 2022-02-15 DIAGNOSIS — J449 Chronic obstructive pulmonary disease, unspecified: Secondary | ICD-10-CM | POA: Diagnosis not present

## 2022-02-15 DIAGNOSIS — F0390 Unspecified dementia without behavioral disturbance: Secondary | ICD-10-CM | POA: Diagnosis not present

## 2022-02-15 DIAGNOSIS — N111 Chronic obstructive pyelonephritis: Secondary | ICD-10-CM | POA: Diagnosis not present

## 2022-02-15 LAB — CBC
HCT: 34.2 % — ABNORMAL LOW (ref 36.0–46.0)
Hemoglobin: 10.4 g/dL — ABNORMAL LOW (ref 12.0–15.0)
MCH: 25.7 pg — ABNORMAL LOW (ref 26.0–34.0)
MCHC: 30.4 g/dL (ref 30.0–36.0)
MCV: 84.7 fL (ref 80.0–100.0)
Platelets: 231 10*3/uL (ref 150–400)
RBC: 4.04 MIL/uL (ref 3.87–5.11)
RDW: 16.5 % — ABNORMAL HIGH (ref 11.5–15.5)
WBC: 9.3 10*3/uL (ref 4.0–10.5)
nRBC: 0 % (ref 0.0–0.2)

## 2022-02-15 LAB — TSH: TSH: 1.198 u[IU]/mL (ref 0.350–4.500)

## 2022-02-15 LAB — BASIC METABOLIC PANEL
Anion gap: 5 (ref 5–15)
BUN: 21 mg/dL (ref 8–23)
CO2: 21 mmol/L — ABNORMAL LOW (ref 22–32)
Calcium: 8.2 mg/dL — ABNORMAL LOW (ref 8.9–10.3)
Chloride: 115 mmol/L — ABNORMAL HIGH (ref 98–111)
Creatinine, Ser: 0.52 mg/dL (ref 0.44–1.00)
GFR, Estimated: >60 mL/min (ref >60)
Glucose, Bld: 100 mg/dL — ABNORMAL HIGH (ref 70–99)
Potassium: 3.2 mmol/L — ABNORMAL LOW (ref 3.5–5.1)
Sodium: 141 mmol/L (ref 135–145)

## 2022-02-15 MED ORDER — POTASSIUM CHLORIDE CRYS ER 10 MEQ PO TBCR
40.0000 meq | EXTENDED_RELEASE_TABLET | Freq: Once | ORAL | Status: AC
  Administered 2022-02-15: 40 meq via ORAL
  Filled 2022-02-15: qty 4

## 2022-02-15 MED ORDER — IPRATROPIUM BROMIDE 0.02 % IN SOLN
0.5000 mg | Freq: Three times a day (TID) | RESPIRATORY_TRACT | Status: DC
  Administered 2022-02-15 – 2022-02-16 (×2): 0.5 mg via RESPIRATORY_TRACT
  Filled 2022-02-15 (×2): qty 2.5

## 2022-02-15 MED ORDER — LEVALBUTEROL HCL 0.63 MG/3ML IN NEBU
0.6300 mg | INHALATION_SOLUTION | Freq: Three times a day (TID) | RESPIRATORY_TRACT | Status: DC
  Administered 2022-02-15: 0.63 mg via RESPIRATORY_TRACT
  Filled 2022-02-15: qty 3

## 2022-02-15 MED ORDER — IPRATROPIUM BROMIDE 0.02 % IN SOLN
0.5000 mg | Freq: Three times a day (TID) | RESPIRATORY_TRACT | Status: DC
  Administered 2022-02-15: 0.5 mg via RESPIRATORY_TRACT
  Filled 2022-02-15: qty 2.5

## 2022-02-15 MED ORDER — FUROSEMIDE 10 MG/ML IJ SOLN
20.0000 mg | Freq: Once | INTRAMUSCULAR | Status: AC
  Administered 2022-02-15: 20 mg via INTRAVENOUS
  Filled 2022-02-15: qty 2

## 2022-02-15 MED ORDER — POTASSIUM CHLORIDE CRYS ER 20 MEQ PO TBCR
40.0000 meq | EXTENDED_RELEASE_TABLET | Freq: Once | ORAL | Status: AC
  Administered 2022-02-15: 40 meq via ORAL
  Filled 2022-02-15: qty 2

## 2022-02-15 MED ORDER — CEFDINIR 300 MG PO CAPS
300.0000 mg | ORAL_CAPSULE | Freq: Two times a day (BID) | ORAL | Status: DC
  Administered 2022-02-15 – 2022-02-16 (×2): 300 mg via ORAL
  Filled 2022-02-15 (×2): qty 1

## 2022-02-15 MED ORDER — LEVALBUTEROL HCL 0.63 MG/3ML IN NEBU
0.6300 mg | INHALATION_SOLUTION | Freq: Three times a day (TID) | RESPIRATORY_TRACT | Status: DC
  Administered 2022-02-15 – 2022-02-16 (×2): 0.63 mg via RESPIRATORY_TRACT
  Filled 2022-02-15 (×2): qty 3

## 2022-02-15 MED ORDER — CARVEDILOL 3.125 MG PO TABS
3.1250 mg | ORAL_TABLET | Freq: Two times a day (BID) | ORAL | Status: DC
  Administered 2022-02-15 – 2022-02-16 (×2): 3.125 mg via ORAL
  Filled 2022-02-15 (×2): qty 1

## 2022-02-15 NOTE — Progress Notes (Signed)
2 Days Post-Op   Subjective/Chief Complaint:  1 - Right Ureteral Stone -  s/p Rt ureteral stent 02/13/22 for 55m Rt prox ureteral sotne with mild hydro, few scattered small Rt renal stones by ER CT 02/12/22 on eval Rt flank pain. Some luekocytosis and mildy tachycardia that improved with IVF. UA with some bacteruria as expected with SP Tube. Placed on empiric rocephin. No prior stones. Cr <1.    2 - Incomplete Bladder Emptying / Urethral Stricture / Chronic SP Tube - s/p SP Tube palcement 10/2021 for retention and partial urethral stricture. Now prefers for QOL. Exchanged in OR 11/8.  3 - Pyelonephritis - new high fevers after ureteral stenting 11/8 most c/w early pyelo. UCX 11/7 non-clonal, ULenna [place]11/8 NGTD on empiric rocephin.    Today "[name] is improving. Fever curve trending down. CX's non-clonal / no growth.     Objective: Vital signs in last 24 hours: Temp:  [97.9 F (36.6 C)-99.2 F (37.3 C)] 97.9 F (36.6 C) (11/10 0454) Pulse Rate:  [84-95] 86 (11/10 0454) Resp:  [18-20] 18 (11/10 0454) BP: (123-135)/(76-85) 131/85 (11/10 0454) SpO2:  [95 %-98 %] 98 % (11/10 0454) Weight:  [74.3 kg] 74.3 kg (11/10 0610) Last BM Date :  (Unknown)  Intake/Output from previous day: 11/09 0701 - 11/10 0700 In: 1120 [P.O.:1020; IV Piggyback:100] Out: 802 [Urine:801; Stool:1] Intake/Output this shift: No intake/output data recorded.   EXAM: NAD, AOx3, extremely pleasant, friend at bedside Non-labored breathing SNTND, No CVAT. SPT with clear yellow urine that is not-foul No c/c/e.  Lab Results:  Recent Labs    02/14/22 0425 02/15/22 0427  WBC 11.7* 9.3  HGB 9.9* 10.4*  HCT 32.6* 34.2*  PLT 218 231   BMET Recent Labs    02/14/22 0425 02/15/22 0427  NA 142 141  K 3.1* 3.2*  CL 117* 115*  CO2 18* 21*  GLUCOSE 87 100*  BUN 34* 21  CREATININE 0.70 0.52  CALCIUM 7.9* 8.2*   PT/INR No results for input(s): "LABPROT", "INR" in the last 72 hours. ABG No results for  input(s): "PHART", "HCO3" in the last 72 hours.  Invalid input(s): "PCO2", "PO2"  Studies/Results: DG C-Arm 1-60 Min-No Report  Result Date: 02/13/2022 Fluoroscopy was utilized by the requesting physician.  No radiographic interpretation.    Anti-infectives: Anti-infectives (From admission, onward)    Start     Dose/Rate Route Frequency Ordered Stop   02/13/22 2000  cefTRIAXone (ROCEPHIN) 1 g in sodium chloride 0.9 % 100 mL IVPB  Status:  Discontinued        1 g 200 mL/hr over 30 Minutes Intravenous Every 24 hours 02/12/22 2200 02/13/22 0933   02/13/22 2000  cefTRIAXone (ROCEPHIN) 2 g in sodium chloride 0.9 % 100 mL IVPB        2 g 200 mL/hr over 30 Minutes Intravenous Every 24 hours 02/13/22 0933     02/12/22 1915  cefTRIAXone (ROCEPHIN) 1 g in sodium chloride 0.9 % 100 mL IVPB        1 g 200 mL/hr over 30 Minutes Intravenous  Once 02/12/22 1906 02/12/22 2057       Assessment/Plan:  I feel pt OK for DC from GU perspective back to [place] no high grade fevers x 24 hours, she is essentially meeting that criteria now. PO cephalosporin such as keflex likely sufficient for about 7-10 days total course.   Greatly appreciate medical team comanagment.    TAlexis Frock11/01/2022

## 2022-02-15 NOTE — Progress Notes (Signed)
[facility] [facility] [address] [facility]) Hospitalized Hospice Patient   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 She was admitted to [facility] on 02/12/2022 for Back pain/Fever/possible UTI after [facility] received a positive UTI lab result and called EMS to transport patient to the hospital for evaluation. [facility] was notified by telephone of patient transport to [facility] ED. Per [facility] MD, Dr. Cherie Ouch, this is a related admission.   Pt alert and oriented, feels neither good nor bad. She is having some SOB with speech, states she needs to catch her breath. Another episode of SVT during the evening, asymptomatic. Today is her birthday and she is excited to see some of her friends later today.    She is currently inpatient appropriate for treatment of obstructive pyelonephritis requiring renal decompression.    V/S: 99.3 oral, 133/90, HR 102, RR 18, SPO2 95% on RA I&O: 1120/802 Labs: K+ 3.2, BUN 21 Diagnostics: none IV/PRN: lasix 20 mg IV x 1, zofran 4 mg IV x 1   Problem List: - hydronephrosis secondary to obstructing ureteral stone - stent placed on 11.8, WBCs trending down, abx changed to oral, urine and blood cx pending - dementia - alert and oriented, able to make her needs known, under hospice services for this - run of SVT - 12 lead ~ 1st degree AV block, asymptomatic, K+ low and being repleted   D/C planning - return to [facility] at d/c, likely tomorrow per discussion with attending [place] - clear, DNR Family - pt is keenly aware of what is going on and will update her sister as needed IDT - hospice team updated   At discharge, please use [facility] for transport.   Thank you, Venia Carbon DNP, RN [facility] Liaison

## 2022-02-15 NOTE — Progress Notes (Signed)
PROGRESS NOTE    [name]  XFG:[id number] DOB: [birth year] DOA: 02/12/2022 PCP: Mast, Man X, NP    Chief Complaint  Patient presents with   Back Pain    Brief Narrative: Patient is a pleasant 88 year old female with history of dementia, emphysema, seizure disorder no longer on antiepileptics presented to the ED with right-sided back pain.  Patient noted with complaints of dysuria and urinalysis at nursing facility compatible with UTI started on ciprofloxacin 1 day prior to admission cultures sent.  Due to complaints of back pain generalized weakness patient also noted to have a leukocytosis white count 7.3 patient sent to the ED.  Patient seen in the ED noted to be in a sinus tachycardia, CBC done with a leukocytosis white count of 19.1, elevated BUN to creatinine ratio.  CT abdomen and pelvis showed a obstructing stone in the right proximal ureter with moderate hydronephrosis and perinephric edema.  Urology consulted, urinalysis sent for cultures patient placed on IV Rocephin, IV fluids.  Patient taken to the OR by urology for renal decompression and right ureteral stent placement 02/13/2022.  Patient placed empirically on IV antibiotics pending culture results.   Assessment & Plan:   Principal Problem:   Obstructive pyelonephritis Active Problems:   Senile dementia (HCC)   Restless legs syndrome (RLS)   Panlobular emphysema (HCC)   Ureteral stone with hydronephrosis   Seizure ([place])   Dementia without behavioral disturbance (HCC)   SVT (supraventricular tachycardia)  #1 obstructing ureteral stone with hydronephrosis/early obstructive pyelonephritis -Patient with complaints of back pain, noted to have a dysuria, recent diagnosis of UTI. -Patient noted to have a fever 102.1 on presentation. -Fever curve trending down. -Urinalysis done with moderate leukocytes, positive nitrites, WBCs > 50 .-Urine cultures with multiple species present.   -Blood cultures pending. -CT abdomen  and pelvis done with nonobstructing stone in the right proximal ureter with moderate hydronephrosis and perinephric edema. -Patient seen in consultation by urology and underwent cystoscopy with right retrograde pyelogram and interpretation, right ureteral stent placement, suprapubic tube exchange per urology, Dr. Manny11/11/2021 -Transition from IV Rocephin to Levan to complete a 7 to 10-day course of antibiotic treatment.   -Urology following.  -Supportive care.  2.  COPD -Patient with some complaints of dyspnea this morning. -Place on scheduled bronchodilators. -Lasix 20 mg IV x1. -Bronchodilators as needed.  3.  History of seizures -Seizure-free and off antiepileptics since August 2023. -Monitor.  4.  Dementia -Delirium precautions. -Being followed by hospice in the outpatient setting  5.  Run of SVT -RN patient noted to have a run of SVT of about 11.2 seconds heart rates in the 130s to the 140s, patient remained asymptomatic on 02/15/2022. -TSH within normal limits at 1.198.  -Telemetry with some PACs, some bouts of SVTs.  -Coreg 3.125 mg twice daily.  -Continue treatment of infection. -Outpatient follow-up.   DVT prophylaxis: SCDs. Code Status: DNR Family Communication: Updated patient.  No family at bedside. Disposition: Likely back to SNF when clinically stable, improved medically, cleared by urology, hopefully in the next 1 to 2 days.  Status is: Inpatient Remains inpatient appropriate because: Severity of illness   Consultants:  Urology: [facility] 02/12/2022  Procedures:  Cystoscopy with right retrograde pyelogram and interpretation/right ureteral stent placement/suprapubic tube exchange per urology: Dr. Tresa Moore 02/13/2022 CT abdomen and pelvis 02/12/2022  Antimicrobials:  IV Rocephin 02/12/2022>>>>> 02/15/2022 Omnicef 02/15/2022>>>> 02/22/2022   Subjective: Patient laying in bed getting changed.  Some complaints of shortness of breath.  Did  endorse some nausea  this morning.  Denies any chest pain.  Denies any abdominal pain.  Was confused as to whether she will have another procedure today.  States just does not feel well today.     Objective: Vitals:   02/15/22 0610 02/15/22 0900 02/15/22 1100 02/15/22 1148  BP:    (!) 133/90  Pulse:    91  Resp:  (!) 26 (!) 33   Temp:    99.3 F (37.4 C)  TempSrc:    Oral  SpO2:    95%  Weight: 74.3 kg     Height:        Intake/Output Summary (Last 24 hours) at 02/15/2022 1214 Last data filed at 02/15/2022 0906 Gross per 24 hour  Intake 1060 ml  Output 1002 ml  Net 58 ml    Filed Weights   02/13/22 0740 02/14/22 0600 02/15/22 0610  Weight: 65.8 kg 73.1 kg 74.3 kg    Examination:  General exam: NAD. Respiratory system: Some decreased breath sounds in the bases otherwise clear.  No wheezes, no crackles, no rhonchi.  Fair air movement.   Cardiovascular system: RRR no murmurs rubs or gallops.  No JVD.  No lower extremity edema.   Gastrointestinal system: Abdomen is soft, nontender, nondistended, positive bowel sounds.  Suprapubic catheter in place with yellow urine noted in Foley bag.  No CVA tenderness.  Central nervous system: Alert and oriented.  Moving extremities spontaneously.  No focal neurological deficits. Extremities: Symmetric 5 x 5 power. Skin: No rashes, lesions or ulcers Psychiatry: Judgement and insight appear fair. Mood & affect appropriate.     Data Reviewed: I have personally reviewed following labs and imaging studies  CBC: Recent Labs  Lab 02/12/22 1831 02/13/22 0543 02/14/22 0425 02/15/22 0427  WBC 19.1* 12.4* 11.7* 9.3  HGB 12.8 11.6* 9.9* 10.4*  HCT 41.3 38.7 32.6* 34.2*  MCV 85.3 85.2 85.1 84.7  PLT 241 217 218 231     Basic Metabolic Panel: Recent Labs  Lab 02/12/22 1831 02/13/22 0543 02/14/22 0425 02/15/22 0427  NA 137 141 142 141  K 3.7 3.8 3.1* 3.2*  CL 109 112* 117* 115*  CO2 21* 22 18* 21*  GLUCOSE 114* 97 87 100*  BUN 37* 36* 34* 21   CREATININE 0.99 0.87 0.70 0.52  CALCIUM 8.7* 8.3* 7.9* 8.2*  MG  --  2.3 2.1  --      GFR: Estimated Creatinine Clearance: 46.8 mL/min (by C-G formula based on SCr of 0.52 mg/dL).  Liver Function Tests: No results for input(s): "AST", "ALT", "ALKPHOS", "BILITOT", "PROT", "ALBUMIN" in the last 168 hours.  CBG: Recent Labs  Lab 02/13/22 0939 02/14/22 2039  GLUCAP 109* 108*      Recent Results (from the past 240 hour(s))  Resp Panel by RT-PCR (Flu A&B, Covid) Anterior Nasal Swab     Status: None   Collection Time: 02/12/22  6:39 PM   Specimen: Anterior Nasal Swab  Result Value Ref Range Status   SARS Coronavirus 2 by RT PCR NEGATIVE NEGATIVE Final    Comment: (NOTE) SARS-CoV-2 target nucleic acids are NOT DETECTED.  The SARS-CoV-2 RNA is generally detectable in upper respiratory specimens during the acute phase of infection. The lowest concentration of SARS-CoV-2 viral copies this assay can detect is 138 copies/mL. A negative result does not preclude SARS-Cov-2 infection and should not be used as the sole basis for treatment or other patient management decisions. A negative result may occur with  improper specimen  collection/handling, submission of specimen other than nasopharyngeal swab, presence of viral mutation(s) within the areas targeted by this assay, and inadequate number of viral copies(<138 copies/mL). A negative result must be combined with clinical observations, patient history, and epidemiological information. The expected result is Negative.  Fact Sheet for Patients:  EntrepreneurPulse.com.au  Fact Sheet for Healthcare Providers:  IncredibleEmployment.be  This test is no t yet approved or cleared by the [place] FDA and  has been authorized for detection and/or diagnosis of SARS-CoV-2 by FDA under an Emergency Use Authorization (EUA). This EUA will remain  in effect (meaning this test can be used) for the  duration of the COVID-19 declaration under Section 564(b)(1) of the Act, 21 U.S.C.section 360bbb-3(b)(1), unless the authorization is terminated  or revoked sooner.       Influenza A by PCR NEGATIVE NEGATIVE Final   Influenza B by PCR NEGATIVE NEGATIVE Final    Comment: (NOTE) The Xpert Xpress SARS-CoV-2/FLU/RSV plus assay is intended as an aid in the diagnosis of influenza from Nasopharyngeal swab specimens and should not be used as a sole basis for treatment. Nasal washings and aspirates are unacceptable for Xpert Xpress SARS-CoV-2/FLU/RSV testing.  Fact Sheet for Patients: EntrepreneurPulse.com.au  Fact Sheet for Healthcare Providers: IncredibleEmployment.be  This test is not yet approved or cleared by the [place] FDA and has been authorized for detection and/or diagnosis of SARS-CoV-2 by FDA under an Emergency Use Authorization (EUA). This EUA will remain in effect (meaning this test can be used) for the duration of the COVID-19 declaration under Section 564(b)(1) of the Act, 21 U.S.C. section 360bbb-3(b)(1), unless the authorization is terminated or revoked.  Performed at [facility], [place] [address], [place], [place] [address]   Urine Culture     Status: Abnormal   Collection Time: 02/12/22  6:39 PM   Specimen: Urine, Clean Catch  Result Value Ref Range Status   Specimen Description   Final    URINE, CLEAN CATCH Performed at [facility], [place] [address] [place], [place] [address]    Special Requests   Final    NONE Performed at [facility], [place] [address], [place], [place] [address]    Culture MULTIPLE SPECIES PRESENT, SUGGEST RECOLLECTION (A)  Final   Report Status 02/13/2022 FINAL  Final  Culture, blood (single) w Reflex to ID Panel     Status: None (Preliminary result)   Collection Time: 02/13/22  8:58 AM   Specimen: BLOOD RIGHT HAND  Result Value Ref Range  Status   Specimen Description   Final    BLOOD RIGHT HAND Performed at [facility] [address], [place], [place] [address]    Special Requests   Final    BOTTLES DRAWN AEROBIC ONLY Blood Culture results may not be optimal due to an inadequate volume of blood received in culture bottles Performed at [facility] [address], [place], [place] [address]    Culture   Final    NO GROWTH 2 DAYS Performed at [facility] [address], [place],  [address]    Report Status PENDING  Incomplete  Culture, blood (single) w Reflex to ID Panel     Status: None (Preliminary result)   Collection Time: 02/13/22 12:45 PM   Specimen: BLOOD LEFT HAND  Result Value Ref Range Status   Specimen Description   Final    BLOOD LEFT HAND Performed at [facility]  [address] [place], [place] [address]    Special Requests   Final    BOTTLES DRAWN AEROBIC ONLY Blood Culture adequate volume Performed at [facility] [address], [place], [place] [address]    Culture   Final    NO GROWTH 2 DAYS Performed at [facility] [address] [place], [place] [address]    Report Status PENDING  Incomplete         Radiology Studies: No results found.      Scheduled Meds:  carvedilol  3.125 mg Oral BID WC   cefdinir  300 mg Oral Q12H   Chlorhexidine Gluconate Cloth  6 each Topical Q0600   pramipexole  0.5 mg Oral QHS   saccharomyces boulardii  250 mg Oral BID   sodium chloride flush  3 mL Intravenous Q12H   Continuous Infusions:     LOS: 3 days    Time spent: 40 minutes    Irine Seal, MD [facility]   To contact the attending provider between 7A-7P or the covering provider during after hours 7P-7A, please log into the web site [email or web address] and access using universal [place] password for that web site. If you do not have the password, please call the hospital  operator.  02/15/2022, 12:14 PM

## 2022-02-16 DIAGNOSIS — I471 Supraventricular tachycardia, unspecified: Secondary | ICD-10-CM | POA: Diagnosis not present

## 2022-02-16 DIAGNOSIS — F0390 Unspecified dementia without behavioral disturbance: Secondary | ICD-10-CM | POA: Diagnosis not present

## 2022-02-16 DIAGNOSIS — N111 Chronic obstructive pyelonephritis: Secondary | ICD-10-CM | POA: Diagnosis not present

## 2022-02-16 DIAGNOSIS — J431 Panlobular emphysema: Secondary | ICD-10-CM | POA: Diagnosis not present

## 2022-02-16 LAB — BASIC METABOLIC PANEL
Anion gap: 4 — ABNORMAL LOW (ref 5–15)
BUN: 15 mg/dL (ref 8–23)
CO2: 24 mmol/L (ref 22–32)
Calcium: 8.2 mg/dL — ABNORMAL LOW (ref 8.9–10.3)
Chloride: 111 mmol/L (ref 98–111)
Creatinine, Ser: 0.6 mg/dL (ref 0.44–1.00)
GFR, Estimated: >60 mL/min (ref >60)
Glucose, Bld: 100 mg/dL — ABNORMAL HIGH (ref 70–99)
Potassium: 3.4 mmol/L — ABNORMAL LOW (ref 3.5–5.1)
Sodium: 139 mmol/L (ref 135–145)

## 2022-02-16 LAB — CBC
HCT: 37 % (ref 36.0–46.0)
Hemoglobin: 10.9 g/dL — ABNORMAL LOW (ref 12.0–15.0)
MCH: 25.6 pg — ABNORMAL LOW (ref 26.0–34.0)
MCHC: 29.5 g/dL — ABNORMAL LOW (ref 30.0–36.0)
MCV: 86.9 fL (ref 80.0–100.0)
Platelets: 282 10*3/uL (ref 150–400)
RBC: 4.26 MIL/uL (ref 3.87–5.11)
RDW: 16.6 % — ABNORMAL HIGH (ref 11.5–15.5)
WBC: 9.7 10*3/uL (ref 4.0–10.5)
nRBC: 0 % (ref 0.0–0.2)

## 2022-02-16 MED ORDER — OXYBUTYNIN CHLORIDE 5 MG PO TABS: 5.0000 mg | ORAL_TABLET | Freq: Three times a day (TID) | ORAL | 1 refills | Status: DC

## 2022-02-16 MED ORDER — IPRATROPIUM BROMIDE 0.02 % IN SOLN: RESPIRATORY_TRACT | 2 refills | Status: DC

## 2022-02-16 MED ORDER — CEFDINIR 300 MG PO CAPS: 300.0000 mg | ORAL_CAPSULE | Freq: Two times a day (BID) | ORAL | 0 refills | Status: AC

## 2022-02-16 MED ORDER — ONDANSETRON HCL 4 MG PO TABS: 4.0000 mg | ORAL_TABLET | Freq: Four times a day (QID) | ORAL | 0 refills | Status: DC | PRN

## 2022-02-16 MED ORDER — CARVEDILOL 3.125 MG PO TABS: 3.1250 mg | ORAL_TABLET | Freq: Two times a day (BID) | ORAL | 1 refills | Status: AC

## 2022-02-16 MED ORDER — OXYBUTYNIN CHLORIDE 5 MG PO TABS
5.0000 mg | ORAL_TABLET | Freq: Three times a day (TID) | ORAL | Status: DC
  Administered 2022-02-16: 5 mg via ORAL
  Filled 2022-02-16: qty 1

## 2022-02-16 MED ORDER — POTASSIUM CHLORIDE CRYS ER 10 MEQ PO TBCR
40.0000 meq | EXTENDED_RELEASE_TABLET | Freq: Once | ORAL | Status: AC
  Administered 2022-02-16: 40 meq via ORAL
  Filled 2022-02-16: qty 4

## 2022-02-16 MED ORDER — POTASSIUM CHLORIDE CRYS ER 20 MEQ PO TBCR: 40.0000 meq | EXTENDED_RELEASE_TABLET | Freq: Once | ORAL | Status: DC

## 2022-02-16 MED ORDER — LEVALBUTEROL HCL 0.63 MG/3ML IN NEBU: INHALATION_SOLUTION | RESPIRATORY_TRACT | 2 refills | Status: DC

## 2022-02-16 NOTE — TOC Transition Note (Signed)
Transition of Care [facility]) - CM/SW Discharge Note   Patient Details  Name: [name] MRN: [id number] Date of Birth: March 30, 1933  [facility] CM/SW Contact:  Vassie Moselle, LCSW Phone Number: 02/16/2022, 12:12 PM   Clinical Narrative:    Pt is  to return to [facility]. Pt will be going to room 29. RN to call report to [phone] ext [phone]. CSW spoke with pt's sister and confirmed discharge plans. [facility] has been called for transportation at 12:13pm.    Final next level of care: [place] Barriers to Discharge: Barriers Resolved   Patient Goals and CMS Choice Patient states their goals for this hospitalization and ongoing recovery are::  (Return back to [facility]) CMS Medicare.gov Compare Post Acute Care list provided to:: Patient Represenative (must comment) Choice offered to / list presented to : Sibling  Discharge Placement                Patient to be transferred to facility by: [facility] Name of family member notified: sister, [name] [phone] Patient and family notified of of transfer: 02/16/22  Discharge Plan and Services   Discharge Planning Services: CM Consult Post Acute Care Choice: Resumption of Svcs/PTA Provider (LTC w/hospice)          DME Arranged: N/A DME Agency: NA                  Social Determinants of Health ([place]) Interventions     Readmission Risk Interventions    02/16/2022   12:11 PM  Readmission Risk Prevention Plan  Transportation Screening Complete  PCP or Specialist Appt within 5-7 Days Complete  Home Care Screening Complete  Medication Review (RN CM) Complete

## 2022-02-16 NOTE — Discharge Summary (Addendum)
Physician Discharge Summary  [name] PXT:[id number] DOB: 04-Aug-1932 DOA: 02/12/2022  PCP: Mast, Man X, NP  Admit date: 02/12/2022 Discharge date: 02/16/2022  Time spent: 60 minutes  Recommendations for Outpatient Follow-up:  Follow-up with Dr. Tresa Moore, urology in 2 weeks.  Follow-up on obstructive pyelonephritis/obstructing ureteral stone status post stent placement and also on probable bladder spasms and suprapubic catheter. Follow-up with Mast, Man X, NP in 1 week.  On follow-up patient will need a basic metabolic profile done to follow-up on electrolytes, renal function.  Patient need a CBC done to follow-up on counts.  Patient's obstructive pyelonephritis will also need to be followed up upon.   Discharge Diagnoses:  Principal Problem:   Obstructive pyelonephritis Active Problems:   Senile dementia (HCC)   Restless legs syndrome (RLS)   Panlobular emphysema (HCC)   Ureteral stone with hydronephrosis   Seizure ([place])   Dementia without behavioral disturbance (HCC)   SVT (supraventricular tachycardia)   Discharge Condition: Labile and improved  Diet recommendation: Regular  Filed Weights   02/14/22 0600 02/15/22 0610 02/16/22 0500  Weight: 73.1 kg 74.3 kg 67.6 kg    History of present illness:  HPI Per Dr. Binnie Rail is a 88 y.o. female with medical history significant for dementia, emphysema, and seizure disorder no longer on antiepileptics, presenting to the emergency department with back pain.   Patient had been complaining of dysuria, had urinalysis at her nursing facility compatible with possible UTI, was started on ciprofloxacin yesterday, and urine was sent for culture.  She was complaining of back pain today, has had generalized weakness, and was noted to have a leukocytosis to 17,300.  She was directed to the ED for further evaluation of this.     ED Course: Upon arrival to the ED, patient is found to be afebrile and saturating mid 90s on room air with  systolic blood pressure of 102 and greater.  She is in sinus tachycardia with rate 109, PAC, first-degree AV nodal block, and LAFB on EKG.  Blood work notable for WBC 19,100 and elevated BUN to creatinine ratio.  CT of the abdomen and pelvis demonstrates obstructing stone in the right proximal ureter with moderate hydronephrosis and perinephric edema.   Urology was consulted by the ED PA, urine was sent for culture, and the patient was treated with Rocephin, IV fluids, morphine, and Toradol.   Hospital Course:  #1 obstructing ureteral stone with hydronephrosis/early obstructive pyelonephritis -Patient with complaints of back pain on presentation, noted to have a dysuria, recent diagnosis of UTI. -Patient noted to have a fever 102.1 on presentation. -Fever curve trended down during the hospitalization. -Urinalysis done with moderate leukocytes, positive nitrites, WBCs > 50 .-Urine cultures with multiple species present.   -Blood cultures obtained with no growth to date x3 days. -CT abdomen and pelvis done with nonobstructing stone in the right proximal ureter with moderate hydronephrosis and perinephric edema. -Patient seen in consultation by urology and underwent cystoscopy with right retrograde pyelogram and interpretation, right ureteral stent placement, suprapubic tube exchange per urology, Dr. Manny11/11/2021 -Patient placed empirically on IV Rocephin improved clinically subsequently transition to [facility] will be discharged on 7 more days of oral antibiotics to complete a 10-day course of antibiotic treatment.   -Patient was followed by urology throughout the hospitalization.  -Outpatient follow-up with urology, Dr. Tresa Moore 2 weeks postdischarge.   2.  COPD -Patient with some complaints of dyspnea in the morning of 02/15/2022.   -Patient given a dose  of Lasix 20 mg IV x1 with urine output of 1.4 L, patient also placed on scheduled Xopenex and Atrovent nebs with clinical improvement.   -Patient  be discharged back to skilled nursing facility on Xopenex and Atrovent nebs 3 times daily x3 days and subsequently every 6 hours as needed.   -Patient will be discharged in stable and improved condition.    3.  History of seizures -Seizure-free and off antiepileptics since August 2023. -Monitor.   4.  Dementia -Delirium precautions. -Being followed by hospice in the outpatient setting. -Patient seen by hospice liaison throughout the hospitalization and patient will be followed up in the outpatient setting.   5.  Run of SVT -RN patient noted to have a run of SVT of about 11.2 seconds heart rates in the 130s to the 140s, patient remained asymptomatic on 02/15/2022. -TSH within normal limits at 1.198.  -Telemetry with some PACs, some bouts of SVTs.  -Patient started on Coreg 3.125 mg twice daily with improvement and no further runs of SVT. -Patient also treated for infection. -Outpatient follow-up with PCP.  6.  Status post suprapubic catheter -Patient noted to have presented with suprapubic catheter prior to admission, suprapubic catheter was exchanged by urology which patient be discharged on. -On day of discharge due to concerns for bladder spasms patient started on oxybutynin 5 mg 3 times daily and will need outpatient follow-up with urology for further management.    Procedures: Cystoscopy with right retrograde pyelogram and interpretation/right ureteral stent placement/suprapubic tube exchange per urology: Dr. Tresa Moore 02/13/2022 CT abdomen and pelvis 02/12/2022  Consultations: Urology: [facility] 02/12/2022   Discharge Exam: Vitals:   02/16/22 0755 02/16/22 1250  BP:  124/78  Pulse:  85  Resp:  (!) 22  Temp:  98.1 F (36.7 C)  SpO2: 93% 94%    General: NAD. Cardiovascular: RRR no murmurs rubs or gallops.  No JVD.  No lower extremity edema. Respiratory: Clear to auscultation bilaterally.  No wheezes, no crackles, no rhonchi.  Fair air movement.  Speaking in full  sentences.  Discharge Instructions   Discharge Instructions     Diet general   Complete by: As directed    Increase activity slowly   Complete by: As directed    No wound care   Complete by: As directed       Allergies as of 02/16/2022       Reactions   Vioxx [rofecoxib] Shortness Of Breath   Dyazide [hydrochlorothiazide W-triamterene] Other (See Comments)   Lowers blood pressure too much   Latex Swelling, Other (See Comments)   "Allergic," per [facility]   Sulfa Antibiotics Nausea And Vomiting, Other (See Comments)   "Allergic," per [facility]   Sumycin [tetracycline] Other (See Comments)   Can't take due to drug reaction- "Allergic," per [facility]        Medication List     STOP taking these medications    ciprofloxacin 500 MG tablet Commonly known as: CIPRO       TAKE these medications    acetaminophen 325 MG tablet Commonly known as: TYLENOL Take 650 mg by mouth every 6 (six) hours as needed for fever or mild pain.   carvedilol 3.125 MG tablet Commonly known as: COREG Take 1 tablet (3.125 mg total) by mouth 2 (two) times daily with a meal.   cefdinir 300 MG capsule Commonly known as: OMNICEF Take 1 capsule (300 mg total) by mouth every 12 (twelve) hours for 7 days.   fluticasone 27.5 MCG/SPRAY nasal spray  Commonly known as: VERAMYST Place 1 spray into the nose daily.   hydrocortisone cream 1 % Apply 1 application  topically 2 (two) times daily as needed for itching (to scaly areas of face and ears).   hydrocortisone 2.5 % lotion Apply 1 application  topically 2 (two) times daily as needed (to scaly areas of face and ears).   ipratropium 0.02 % nebulizer solution Commonly known as: ATROVENT Take 2.5 mLs (0.5 mg total) by nebulization 3 (three) times daily for 3 days, THEN 2.5 mLs (0.5 mg total) every 6 (six) hours as needed for up to 3 days. Start taking on: February 16, 2022   ketoconazole 2 % shampoo Commonly known as: NIZORAL Apply 1 application  topically  See admin instructions. Shampoo on Mondays and Thursdays   ketoconazole 2 % cream Commonly known as: NIZORAL Apply 1 application  topically daily as needed (to affected areas for psoriasis).   levalbuterol 0.63 MG/3ML nebulizer solution Commonly known as: XOPENEX Take 3 mLs (0.63 mg total) by nebulization 3 (three) times daily for 3 days, THEN 3 mLs (0.63 mg total) every 6 (six) hours as needed for up to 3 days. Start taking on: February 16, 2022   nystatin powder Commonly known as: MYCOSTATIN/NYSTOP Apply 1 application  topically 2 (two) times daily as needed (urogenital area).   ondansetron 4 MG tablet Commonly known as: ZOFRAN Take 1 tablet (4 mg total) by mouth every 6 (six) hours as needed for nausea.   oxybutynin 5 MG tablet Commonly known as: DITROPAN Take 1 tablet (5 mg total) by mouth 3 (three) times daily.   pramipexole 0.5 MG tablet Commonly known as: MIRAPEX Take 0.5 mg by mouth at bedtime.   ProAir RespiClick 681 (90 Base) MCG/ACT Aepb Generic drug: Albuterol Sulfate Inhale 2 puffs into the lungs See admin instructions. Inhale 2 puffs into the lungs every morning as needed for shortness of breath or wheezing   saccharomyces boulardii 250 MG capsule Commonly known as: FLORASTOR Take 250 mg by mouth 2 (two) times daily. Encounter for prophylactic measures, unspecified   triamcinolone cream 0.1 % Commonly known as: KENALOG Apply 1 application. topically daily as needed (psoriasis).       Allergies  Allergen Reactions   Vioxx [Rofecoxib] Shortness Of Breath   Dyazide [Hydrochlorothiazide W-Triamterene] Other (See Comments)    Lowers blood pressure too much   Latex Swelling and Other (See Comments)    "Allergic," per [facility]   Sulfa Antibiotics Nausea And Vomiting and Other (See Comments)    "Allergic," per [facility]   Sumycin [Tetracycline] Other (See Comments)    Can't take due to drug reaction- "Allergic," per [facility]    Contact information for follow-up providers      Mast, Man X, NP. Schedule an appointment as soon as possible for a visit in 1 week(s).   Specialty: Internal Medicine Contact information: [address] [place] [address] [phone]         Alexis Frock, MD Follow up in 2 week(s).   Specialty: Urology Contact information: [place]  [address] [phone]              Contact information for after-discharge care     Destination     [name] .   Service: Skilled Chiropodist information: [place] [phone]  The results of significant diagnostics from this hospitalization (including imaging, microbiology, ancillary and laboratory) are listed below for reference.    Significant Diagnostic Studies: DG C-Arm 1-60 Min-No Report  Result Date: 02/13/2022 Fluoroscopy was utilized by the requesting physician.  No radiographic interpretation.   CT L-SPINE NO CHARGE  Result Date: 02/12/2022 CLINICAL DATA:  Back pain EXAM: CT LUMBAR SPINE WITHOUT CONTRAST TECHNIQUE: Multidetector CT imaging of the lumbar spine was performed without intravenous contrast administration. Multiplanar CT image reconstructions were also generated. RADIATION DOSE REDUCTION: This exam was performed according to the departmental dose-optimization program which includes automated exposure control, adjustment of the mA and/or kV according to patient size and/or use of iterative reconstruction technique. COMPARISON:  Prior dedicated CT of the lumbar spine, correlation is made with CT abdomen pelvis 10/20/2018 FINDINGS: Segmentation: 5 lumbar type vertebrae. Alignment: Scoliosis with dextroscoliosis of the thoracolumbar junction and compensatory levoscoliosis of the lower lumbar spine. Trace right lateral listhesis of L1 on L2 and 4 mm right lateral listhesis of L2 on L3. 4 mm left lateral listhesis of L4 on L5. Trace  retrolisthesis of T12 on L1, L1 on L2, and L3 on L4. 5 mm anterolisthesis of L4 on L5, likely unchanged compared to 2017. Vertebrae: No acute fracture or suspicious osseous lesion. Compression deformity of T12 appears stable compared to 2017. Paraspinal and other soft tissues: Please see same-day CT abdomen pelvis. Disc levels: T12-L1: Trace retrolisthesis. Facet arthropathy. No spinal canal stenosis. Severe left neural foraminal narrowing. L1-L2: Trace retrolisthesis and minimal disc bulge. Moderate facet arthropathy. No spinal canal stenosis. Mild left neural foraminal narrowing. L2-L3: Mild-to-moderate disc bulge. Moderate facet arthropathy. Mild spinal canal stenosis. Mild bilateral neural foraminal narrowing. L3-L4: Trace retrolisthesis. Mild disc bulge. Mild left and moderate right facet arthropathy. Mild spinal canal stenosis. Mild bilateral neural foraminal narrowing. L4-L5: Grade 1 anterolisthesis with disc unroofing. Moderate facet arthropathy. Ligamentum flavum hypertrophy. Moderate spinal canal stenosis. Moderate right neural foraminal narrowing. L5-S1: No significant disc bulge. Severe right right-greater-than-left facet arthropathy. No spinal canal stenosis. No neural foraminal narrowing. IMPRESSION: 1. No acute fracture or traumatic listhesis. 2. Scoliosis and multilevel degenerative changes, described in detail above. 3. L4-L5 moderate spinal canal stenosis and moderate right neural foraminal narrowing. 4. L2-L3 and L3-L4 mild spinal canal stenosis and mild bilateral neural foraminal narrowing. 5. T12-L1 severe left neural foraminal narrowing. Electronically Signed   By: Merilyn Baba M.D.   On: 02/12/2022 19:53   CT ABDOMEN PELVIS W CONTRAST  Result Date: 02/12/2022 CLINICAL DATA:  Acute abdominal pain. Back pain. Diagnosed with urinary tract infection yesterday. EXAM: CT ABDOMEN AND PELVIS WITH CONTRAST TECHNIQUE: Multidetector CT imaging of the abdomen and pelvis was performed using the  standard protocol following bolus administration of intravenous contrast. RADIATION DOSE REDUCTION: This exam was performed according to the departmental dose-optimization program which includes automated exposure control, adjustment of the mA and/or kV according to patient size and/or use of iterative reconstruction technique. CONTRAST:  129m OMNIPAQUE IOHEXOL 300 MG/ML  SOLN COMPARISON:  Abdominopelvic CT 10/20/2015. FINDINGS: Lower chest: Emphysema. The thoracic aorta is tortuous. The previous pleural based left lower lobe nodule is obscured by atelectasis and small pleural effusion on the current exam. There is also a small right pleural effusion and atelectasis. Hepatobiliary: There is 6 mm hypodensity in the inferior right hepatic lobe that is too small to accurately characterize. Ill-defined low-density in the central liver at site of hemangioma on prior CT. Clips in the gallbladder fossa postcholecystectomy. No biliary  dilatation. Pancreas: Scattered punctate calcifications suggesting chronic pancreatitis. There is no acute pancreatic inflammation. No ductal dilatation or pancreatic mass. Spleen: Normal in size without focal abnormality. Adrenals/Urinary Tract: Normal adrenal glands. There is an obstructing 12 x 16 mm stone in the right proximal ureter with moderate hydronephrosis and perinephric edema. Mild enhancement of the right renal pelvis and proximal ureter. The ureter distal to this is decompressed. Additional intrarenal calculi in both kidneys. There is no left hydronephrosis. No suspicious renal lesion. Urinary bladder is decompressed by suprapubic catheter, small amount of high-density material in the dependent bladder is of unknown significance, possible layering stones. Stomach/Bowel: Detailed bowel assessment is limited in the absence of enteric contrast. The stomach is nondistended. There is no evidence of small bowel obstruction or inflammation. Enteric sutures are noted within bowel loops  in the pelvis. Moderate stool in the proximal colon, small volume of stool distally. Mild gaseous distension of transverse and proximal descending colon. No colonic inflammation. Vascular/Lymphatic: Moderate to advanced aortic atherosclerosis and tortuosity. No aneurysm. There is no bulky abdominopelvic adenopathy. Prominent portal caval node measuring 9 mm is not enlarged by size criteria. Reproductive: Status post hysterectomy. No adnexal masses. Other: Presacral soft tissue thickening edema. Right perirenal stranding. No free air or ascites. Musculoskeletal: Scoliosis and diffuse degenerative change in the spine. Chronic T12 compression deformity. Postsurgical change in the left proximal femur. No acute osseous findings. IMPRESSION: 1. Obstructing 12 x 16 mm stone in the right proximal ureter with moderate hydronephrosis and perinephric edema. Mild enhancement of the right renal pelvis and proximal ureter, may be related to renal obstruction or concurrent urinary tract infection. 2. Additional intrarenal calculi in both kidneys. 3. Suprapubic catheter in the urinary bladder. Small amount of high-density material in the dependent bladder is of unknown significance, possible layering stones. 4. Additional stable chronic findings as described. Aortic Atherosclerosis (ICD10-I70.0) and Emphysema (ICD10-J43.9). Electronically Signed   By: Keith Rake M.D.   On: 02/12/2022 19:46   DG Chest 2 View  Result Date: 02/12/2022 CLINICAL DATA:  Chest pain EXAM: CHEST - 2 VIEW COMPARISON:  Chest x-ray dated October 05, 2021 FINDINGS: Low lung volumes. Cardiac and mediastinal contours are within normal limits. Trace bilateral pleural effusions and left-greater-than-right PEs atelectasis no evidence of pneumothorax. Severe compression deformities of the lower thoracic spine, unchanged when compared with prior. IMPRESSION: Trace bilateral pleural effusions and left-greater-than-right basilar atelectasis. Electronically Signed    By: Yetta Glassman M.D.   On: 02/12/2022 19:22    Microbiology: Recent Results (from the past 240 hour(s))  Resp Panel by RT-PCR (Flu A&B, Covid) Anterior Nasal Swab     Status: None   Collection Time: 02/12/22  6:39 PM   Specimen: Anterior Nasal Swab  Result Value Ref Range Status   SARS Coronavirus 2 by RT PCR NEGATIVE NEGATIVE Final    Comment: (NOTE) SARS-CoV-2 target nucleic acids are NOT DETECTED.  The SARS-CoV-2 RNA is generally detectable in upper respiratory specimens during the acute phase of infection. The lowest concentration of SARS-CoV-2 viral copies this assay can detect is 138 copies/mL. A negative result does not preclude SARS-Cov-2 infection and should not be used as the sole basis for treatment or other patient management decisions. A negative result may occur with  improper specimen collection/handling, submission of specimen other than nasopharyngeal swab, presence of viral mutation(s) within the areas targeted by this assay, and inadequate number of viral copies(<138 copies/mL). A negative result must be combined with clinical observations, patient history, and  epidemiological information. The expected result is Negative.  Fact Sheet for Patients:  EntrepreneurPulse.com.au  Fact Sheet for Healthcare Providers:  IncredibleEmployment.be  This test is no t yet approved or cleared by the [place] FDA and  has been authorized for detection and/or diagnosis of SARS-CoV-2 by FDA under an Emergency Use Authorization (EUA). This EUA will remain  in effect (meaning this test can be used) for the duration of the COVID-19 declaration under Section 564(b)(1) of the Act, 21 U.S.C.section 360bbb-3(b)(1), unless the authorization is terminated  or revoked sooner.       Influenza A by PCR NEGATIVE NEGATIVE Final   Influenza B by PCR NEGATIVE NEGATIVE Final    Comment: (NOTE) The Xpert Xpress SARS-CoV-2/FLU/RSV plus assay is  intended as an aid in the diagnosis of influenza from Nasopharyngeal swab specimens and should not be used as a sole basis for treatment. Nasal washings and aspirates are unacceptable for Xpert Xpress SARS-CoV-2/FLU/RSV testing.  Fact Sheet for Patients: EntrepreneurPulse.com.au  Fact Sheet for Healthcare Providers: IncredibleEmployment.be  This test is not yet approved or cleared by the [place] FDA and has been authorized for detection and/or diagnosis of SARS-CoV-2 by FDA under an Emergency Use Authorization (EUA). This EUA will remain in effect (meaning this test can be used) for the duration of the COVID-19 declaration under Section 564(b)(1) of the Act, 21 U.S.C. section 360bbb-3(b)(1), unless the authorization is terminated or revoked.  Performed at [facility], [place] [address], [place], [place] [address]   Urine Culture     Status: Abnormal   Collection Time: 02/12/22  6:39 PM   Specimen: Urine, Clean Catch  Result Value Ref Range Status   Specimen Description   Final    URINE, CLEAN CATCH Performed at [facility], [place] [address] [place], [place] [address]    Special Requests   Final    NONE Performed at [facility], [place] [address] [place], [place] [address]    Culture MULTIPLE SPECIES PRESENT, SUGGEST RECOLLECTION (A)  Final   Report Status 02/13/2022 FINAL  Final  Culture, blood (single) w Reflex to ID Panel     Status: None (Preliminary result)   Collection Time: 02/13/22  8:58 AM   Specimen: BLOOD RIGHT HAND  Result Value Ref Range Status   Specimen Description   Final    BLOOD RIGHT HAND Performed at [place] [address], [place], [place] [address]    Special Requests   Final    BOTTLES DRAWN AEROBIC ONLY Blood Culture results may not be optimal due to an inadequate volume of blood received in culture bottles Performed at [place] [address] [place], [place] [address]    Culture   Final    NO GROWTH 3 DAYS Performed at [facility], [place] [address], [place], [place] [address]    Report Status PENDING  Incomplete  Culture, blood (single) w Reflex to ID Panel     Status: None (Preliminary result)   Collection Time: 02/13/22 12:45 PM   Specimen: BLOOD LEFT HAND  Result Value Ref Range Status   Specimen Description   Final    BLOOD LEFT HAND Performed at [place] [address] [place], [place] [address]    Special Requests   Final    BOTTLES DRAWN AEROBIC ONLY Blood Culture adequate volume Performed at [facility] [address], [place], [place] [address]  Culture   Final    NO GROWTH 3 DAYS Performed at [facility], [place] [address], [place], [place] [address]    Report Status PENDING  Incomplete     Labs: Basic Metabolic Panel: Recent Labs  Lab 02/12/22 1831 02/13/22 0543 02/14/22 0425 02/15/22 0427 02/16/22 0904  NA 137 141 142 141 139  K 3.7 3.8 3.1* 3.2* 3.4*  CL 109 112* 117* 115* 111  CO2 21* 22 18* 21* 24  GLUCOSE 114* 97 87 100* 100*  BUN 37* 36* 34* 21 15  CREATININE 0.99 0.87 0.70 0.52 0.60  CALCIUM 8.7* 8.3* 7.9* 8.2* 8.2*  MG  --  2.3 2.1  --   --    Liver Function Tests: No results for input(s): "AST", "ALT", "ALKPHOS", "BILITOT", "PROT", "ALBUMIN" in the last 168 hours. No results for input(s): "LIPASE", "AMYLASE" in the last 168 hours. No results for input(s): "AMMONIA" in the last 168 hours. CBC: Recent Labs  Lab 02/12/22 1831 02/13/22 0543 02/14/22 0425 02/15/22 0427 02/16/22 0904  WBC 19.1* 12.4* 11.7* 9.3 9.7  HGB 12.8 11.6* 9.9* 10.4* 10.9*  HCT 41.3 38.7 32.6* 34.2* 37.0  MCV 85.3 85.2 85.1 84.7 86.9  PLT 241 217 218 231 282   Cardiac Enzymes: No results for input(s): "CKTOTAL", "CKMB", "CKMBINDEX", "TROPONINI" in the last 168 hours. BNP: BNP (last 3 results) No results  for input(s): "BNP" in the last 8760 hours.  ProBNP (last 3 results) No results for input(s): "PROBNP" in the last 8760 hours.  CBG: Recent Labs  Lab 02/13/22 0939 02/14/22 2039  GLUCAP 109* 108*       Signed:  Irine Seal MD.  [facility] 02/16/2022, 1:18 PM

## 2022-02-16 NOTE — Plan of Care (Signed)

## 2022-02-18 ENCOUNTER — Encounter: Payer: Self-pay | Admitting: Nurse Practitioner

## 2022-02-18 ENCOUNTER — Non-Acute Institutional Stay (SKILLED_NURSING_FACILITY): Payer: Medicare PPO | Admitting: Nurse Practitioner

## 2022-02-18 DIAGNOSIS — R627 Adult failure to thrive: Secondary | ICD-10-CM

## 2022-02-18 DIAGNOSIS — Z9359 Other cystostomy status: Secondary | ICD-10-CM

## 2022-02-18 DIAGNOSIS — D638 Anemia in other chronic diseases classified elsewhere: Secondary | ICD-10-CM | POA: Insufficient documentation

## 2022-02-18 DIAGNOSIS — F0390 Unspecified dementia without behavioral disturbance: Secondary | ICD-10-CM

## 2022-02-18 DIAGNOSIS — N132 Hydronephrosis with renal and ureteral calculous obstruction: Secondary | ICD-10-CM

## 2022-02-18 DIAGNOSIS — F323 Major depressive disorder, single episode, severe with psychotic features: Secondary | ICD-10-CM

## 2022-02-18 DIAGNOSIS — I471 Supraventricular tachycardia, unspecified: Secondary | ICD-10-CM | POA: Diagnosis not present

## 2022-02-18 DIAGNOSIS — G2581 Restless legs syndrome: Secondary | ICD-10-CM

## 2022-02-18 DIAGNOSIS — J431 Panlobular emphysema: Secondary | ICD-10-CM

## 2022-02-18 DIAGNOSIS — E876 Hypokalemia: Secondary | ICD-10-CM

## 2022-02-18 DIAGNOSIS — R609 Edema, unspecified: Secondary | ICD-10-CM | POA: Diagnosis not present

## 2022-02-18 DIAGNOSIS — G40909 Epilepsy, unspecified, not intractable, without status epilepticus: Secondary | ICD-10-CM

## 2022-02-18 DIAGNOSIS — N111 Chronic obstructive pyelonephritis: Secondary | ICD-10-CM

## 2022-02-18 LAB — CULTURE, BLOOD (SINGLE)
Culture: NO GROWTH
Culture: NO GROWTH
Special Requests: ADEQUATE

## 2022-02-18 NOTE — Assessment & Plan Note (Signed)
Stable, off meds.

## 2022-02-18 NOTE — Assessment & Plan Note (Signed)
on Atrovent, Xopenx 

## 2022-02-18 NOTE — Assessment & Plan Note (Signed)
stable, taking MiraPex.  

## 2022-02-18 NOTE — Progress Notes (Signed)
Location:   [facility] Room Number: 21 Place of Service:  SNF (31) Provider: Lennie Odor Benjamine Strout NP  Juletta Berhe X, NP  Patient Care Team: Chinmayi Rumer X, NP as PCP - General (Internal Medicine) [name], MD as Consulting Physician (Ophthalmology) [name], MD as Consulting Physician (Cardiology) [name], MD as Consulting Physician (Dermatology) [name], MD as Consulting Physician (Orthopedic Surgery) [name], MD as Consulting Physician (Orthopedic Surgery) [name], MD as Consulting Physician (Orthopedic Surgery) Ramsey, [place], Laiden Milles X, NP as Nurse Practitioner (Nurse Practitioner) [name], MD (Inactive) as Consulting Physician (Neurology)  Extended Emergency Contact Information Primary Emergency Contact: [name],[name] Address: [address]          [place], [place] [address] [name] of [place] Phone: [phone] Relation: Sister Secondary Emergency Contact: [name] [place] of [place] Phone: [phone] Mobile Phone: [phone] Relation: Sister  Code Status: DNR Goals of care: Advanced Directive information    02/13/2022    7:37 AM  Advanced Directives  Does Patient Have a Medical Advance Directive? Yes     Chief Complaint  Patient presents with   Acute Visit    F/u hospital stay    HPI:  Pt is a 88 y.o. female seen today for an acute visit for follow up hospitalization  Hospitalized 02/12/22-02/16/22 for   Obstructive nephritis, underwent cystoscopy with right retrograde pyelogram, right ureteral stent placed, will complete 7 day course of Cefdinir '300mg'$  q12hrs, f/u Urology Dr. Tresa Moore 2 weeks  Ureteral stone with hydronephrosis per CT abd  Hypokalemia, K 3.4 02/16/22  Anemia, Hgb 10.9 02/16/22  SVT, takes Coreg             BLE edema, not apparent, needs off Furosemide.              Adult failure to thrive, under Hospice service.              Suprapubic  Catheter, f/u Urology, on Ditropan             Depression, stable             Dementia, under hospice service             Restless leg syndrome, stable, taking MiraPex.              Hx of seizures, no active seizures, off meds.              COPD, on Atrovent, Xopenx    Past Medical History:  Diagnosis Date   Acute bronchitis 05/23/2011   Acute upper respiratory infections of unspecified site 05/23/2011   Arthritis    Chronic airway obstruction, not elsewhere classified 05/23/2011   Disturbance of salivary secretion 01/31/2011   Dizziness and giddiness 01/31/2011   Dyspnea    Essential tremor 04/25/2014   External hemorrhoids without mention of complication 53/97/6734   Gait disorder 04/25/2014   Insomnia, unspecified 09/12/2011   Lumbago 01/31/2011   Major depressive disorder, single episode, unspecified 01/31/2011   Memory disorder 04/25/2014   Mitral valve disorders(424.0) 01/31/2011   Other and unspecified hyperlipidemia 01/31/2011   Other convulsions 01/31/2011   Other emphysema ([place]) 01/31/2011   Pain in joint, site unspecified 01/31/2011   Restless legs syndrome (RLS) 09/12/2011   Retinal detachment with retinal defect of right eye 2011   right eye twice   Seizure disorder ([place])  Senile osteoporosis 01/31/2011   Spontaneous ecchymoses 01/31/2011   Stiffness of joints, not elsewhere classified, multiple sites 01/31/2011   Unspecified essential hypertension 01/31/2011   Past Surgical History:  Procedure Laterality Date   ABDOMINAL HYSTERECTOMY  06/21/2003   TAH/BSO, omenectomy PSB resect, Stg IC cystadenofibroma   CHOLECYSTECTOMY  2005   Dr. Marlou Starks   CYSTOSCOPY W/ URETERAL STENT PLACEMENT Right 02/13/2022   Procedure: CYSTOSCOPY WITH RETROGRADE PYELOGRAM/URETERAL STENT PLACEMENT, AND SUPRAPUBIC TUBE EXCHANGE;  Surgeon: Alexis Frock, MD;  Location: [facility];  Service: Urology;  Laterality: Right;   ELBOW SURGERY Right 2008   broken   Dr. Apolonio Schneiders   EYE SURGERY      INTRAMEDULLARY (IM) NAIL INTERTROCHANTERIC Left 08/30/2021   Procedure: INTRAMEDULLARY (IM) NAIL INTERTROCHANTRIC;  Surgeon: Rod Can, MD;  Location: [facility];  Service: Orthopedics;  Laterality: Left;   ORIF PATELLA Left 05/02/2020   Procedure: OPEN REDUCTION INTERNAL (ORIF) FIXATION LEFT PATELLA WITH MEDIAL AND LATERAL LIGAMENT REINFORCEMENTS;  Surgeon: [name], MD;  Location: [facility];  Service: Orthopedics;  Laterality: Left;   ORIF PATELLA Left 05/30/2020   Procedure: OPEN REDUCTION INTERNAL (ORIF) FIXATION PATELLA;  Surgeon: [name], MD;  Location: [facility];  Service: Orthopedics;  Laterality: Left;   RETINAL DETACHMENT SURGERY N/A    two   REVERSE SHOULDER ARTHROPLASTY Left 05/06/2019   Procedure: REVERSE SHOULDER ARTHROPLASTY;  Surgeon: Justice Britain, MD;  Location: [facility];  Service: Orthopedics;  Laterality: Left;  120mn   ROTATOR CUFF REPAIR Right 2012   Dr. CTheda Sers  SQUAMOUS CELL CARCINOMA EXCISION Bilateral 2012, 8/14   [place] on legs   Dr. GSarajane Jews  TONSILLECTOMY  1941   VIDEO BRONCHOSCOPY WITH ENDOBRONCHIAL NAVIGATION N/A 11/29/2015   Procedure: VIDEO BRONCHOSCOPY WITH ENDOBRONCHIAL NAVIGATION;  Surgeon: RCollene Gobble MD;  [place] [facility]  Service: Thoracic;  Laterality: N/A;    Allergies  Allergen Reactions   Vioxx [Rofecoxib] Shortness Of Breath   Dyazide [Hydrochlorothiazide W-Triamterene] Other (See Comments)    Lowers blood pressure too much   Latex Swelling and Other (See Comments)    "Allergic," per [facility]   Sulfa Antibiotics Nausea And Vomiting and Other (See Comments)    [facility]  Sumycin [Tetracycline] Other (See Comments)    Can't take due to drug reaction- "Allergic," [facility]   Allergies as of 02/18/2022       Reactions   Vioxx [rofecoxib] Shortness Of Breath   Dyazide [hydrochlorothiazide W-triamterene] Other (See Comments)   Lowers blood pressure too much   Latex Swelling, Other (See Comments)   "Allergic," per [facility]    Sulfa Antibiotics Nausea And Vomiting, Other (See Comments)   [facility]  Sumycin [tetracycline] Other (See Comments)   Can't take due to drug reaction- "Allergic," [facility]       Medication List        Accurate as of February 18, 2022  4:33 PM. If you have any questions, ask your nurse or doctor.          acetaminophen 325 MG tablet Commonly known as: TYLENOL Take 650 mg by mouth every 6 (six) hours as needed for fever or mild pain.   carvedilol 3.125 MG tablet Commonly known as: COREG Take 1 tablet (3.125 mg total) by mouth 2 (two) times daily with a meal.   cefdinir 300 MG capsule Commonly known as: OMNICEF Take 1 capsule (300 mg total) by mouth every 12 (twelve) hours for 7  days.   fluticasone 27.5 MCG/SPRAY nasal spray Commonly known as: VERAMYST Place 1 spray into the nose daily.   hydrocortisone cream 1 % Apply 1 application  topically 2 (two) times daily as needed for itching (to scaly areas of face and ears).   hydrocortisone 2.5 % lotion Apply 1 application  topically 2 (two) times daily as needed (to scaly areas of face and ears).   ipratropium 0.02 % nebulizer solution Commonly known as: ATROVENT Take 2.5 mLs (0.5 mg total) by nebulization 3 (three) times daily for 3 days, THEN 2.5 mLs (0.5 mg total) every 6 (six) hours as needed for up to 3 days. Start taking on: February 16, 2022   ketoconazole 2 % shampoo Commonly known as: NIZORAL Apply 1 application  topically See admin instructions. Shampoo on Mondays and Thursdays   ketoconazole 2 % cream Commonly known as: NIZORAL Apply 1 application  topically daily as needed (to affected areas for psoriasis).   levalbuterol 0.63 MG/3ML nebulizer solution Commonly known as: XOPENEX Take 3 mLs (0.63 mg total) by nebulization 3 (three) times daily for 3 days, THEN 3 mLs (0.63 mg total) every 6 (six) hours as needed for up to 3 days. Start taking on: February 16, 2022   nystatin powder Commonly  known as: MYCOSTATIN/NYSTOP Apply 1 application  topically 2 (two) times daily as needed (urogenital area).   ondansetron 4 MG tablet Commonly known as: ZOFRAN Take 1 tablet (4 mg total) by mouth every 6 (six) hours as needed for nausea.   oxybutynin 5 MG tablet Commonly known as: DITROPAN Take 1 tablet (5 mg total) by mouth 3 (three) times daily.   pramipexole 0.5 MG tablet Commonly known as: MIRAPEX Take 0.5 mg by mouth at bedtime.   ProAir RespiClick 009 (90 Base) MCG/ACT Aepb Generic drug: Albuterol Sulfate Inhale 2 puffs into the lungs See admin instructions. Inhale 2 puffs into the lungs every morning as needed for shortness of breath or wheezing   saccharomyces boulardii 250 MG capsule Commonly known as: FLORASTOR Take 250 mg by mouth 2 (two) times daily. Encounter for prophylactic measures, unspecified   triamcinolone cream 0.1 % Commonly known as: KENALOG Apply 1 application. topically daily as needed (psoriasis).        Review of Systems  Constitutional:  Positive for appetite change and fatigue. Negative for fever.  HENT:  Positive for hearing loss. Negative for congestion and trouble swallowing.   Eyes:  Negative for visual disturbance.  Respiratory:  Negative for cough.        DOE is chronic  Cardiovascular:  Negative for leg swelling.  Gastrointestinal:  Negative for abdominal pain and constipation.       Acid reflux symptoms.   Genitourinary:  Positive for difficulty urinating and hematuria. Negative for dysuria.       [facility]  Musculoskeletal:  Positive for arthralgias and gait problem.       S/p left hip ORIF, ORIF of the left patella.   Skin:  Positive for wound. Negative for color change.       BLE discoloration, chronic venous insufficiency skin changes   Neurological:  Negative for seizures, speech difficulty, weakness and headaches.       Memory lapses. Hx of seizures. RLS  Psychiatric/Behavioral:  Negative for behavioral problems and sleep  disturbance. The patient is not nervous/anxious.     Immunization History  Administered Date(s) Administered   Influenza Split 01/06/2014, 01/15/2017, 01/08/2018, 12/09/2018   Influenza Whole 01/07/2012, 01/06/2013   Influenza,  High Dose Seasonal PF 12/25/2015, 01/20/2017   Influenza,inj,Quad PF,6+ Mos 12/21/2014   Influenza-Unspecified 12/09/2018, 01/18/2020, 01/24/2021   Moderna SARS-COV2 Booster Vaccination 04/02/2021   Moderna Sars-Covid-2 Vaccination 04/12/2019, 06/05/2019, 02/15/2020, 09/05/2020   PFIZER(Purple Top)SARS-COV-2 Vaccination 12/26/2020   Pneumococcal Conjugate-13 02/01/2014   Pneumococcal Polysaccharide-23 12/20/1992, 01/15/2000, 04/08/2004, 06/08/2004   Td 04/08/2002, 04/22/2002   Tdap 04/09/2011, 08/17/2015   Zoster Recombinat (Shingrix) 05/29/2005, 08/27/2017   Zoster, Live 04/08/2008, 05/29/2014, 05/28/2017   Zoster, Unspecified 05/29/2005   Pertinent  Health Maintenance Due  Topic Date Due   INFLUENZA VACCINE  11/06/2021   DEXA SCAN  Completed   MAMMOGRAM  Discontinued      02/14/2022    7:40 AM 02/14/2022    7:47 PM 02/15/2022    3:00 PM 02/15/2022    9:00 PM 02/16/2022    8:00 AM  Fall Risk  Patient Fall Risk Level Low fall risk Low fall risk Low fall risk Moderate fall risk Moderate fall risk   Functional Status Survey:    Vitals:   02/18/22 1301  BP: 98/63  Pulse: 83  Resp: 18  Temp: 98 F (36.7 C)  SpO2: 93%  Weight: 145 lb 6.4 oz (66 kg)   Body mass index is 26.59 kg/m. Physical Exam Vitals and nursing note reviewed.  Constitutional:      Comments: Generalized weakness, but up in w/c  HENT:     Head: Normocephalic and atraumatic.     Nose: No congestion or rhinorrhea.     Mouth/Throat:     Mouth: Mucous membranes are moist.  Eyes:     Extraocular Movements: Extraocular movements intact.     Conjunctiva/sclera: Conjunctivae normal.     Right eye: Right conjunctiva is not injected.     Left eye: Left conjunctiva is not  injected.     Pupils: Pupils are equal, round, and reactive to light.  Neck:     Comments: Left adam's apple bony aspect, sometimes feels soreness on palpation is chronic, no noted lymph nodes  Cardiovascular:     Rate and Rhythm: Normal rate and regular rhythm.     Heart sounds: Murmur heard.     Comments: PD pulses are not felt from previous examination.  Pulmonary:     Effort: Pulmonary effort is normal.     Breath sounds: No rales.     Comments: Decreased air entry to both lungs.  Abdominal:     General: Bowel sounds are normal.     Palpations: Abdomen is soft.     Tenderness: There is no abdominal tenderness. There is no right CVA tenderness, left CVA tenderness, guarding or rebound.     Comments: Mid abd surgical scar. SPC  Genitourinary:    Comments: SPC, tea colored urine Musculoskeletal:     Cervical back: Normal range of motion and neck supple.     Right lower leg: No edema.     Left lower leg: No edema.     Comments: Decreased overhead ROM of the left shoulder. Left knee s/p ORIF of the patella fx.  Skin:    General: Skin is warm and dry.     Comments: BLE discoloration, chronic venous insufficiency skin changes, lateral left lower leg slow healing previous ruptured hematoma. A quarter sized red, warmth, raised, fluid filled area on the previous left knee surgical incision-resolved   Neurological:     General: No focal deficit present.     Mental Status: She is alert. Mental status is at baseline.  Gait: Gait abnormal.  Psychiatric:        Mood and Affect: Mood normal.        Behavior: Behavior normal.     Labs reviewed: Recent Labs    09/02/21 0359 09/04/21 1308 02/13/22 0543 02/14/22 0425 02/15/22 0427 02/16/22 0904  NA 138   < > 141 142 141 139  K 3.2*   < > 3.8 3.1* 3.2* 3.4*  CL 104   < > 112* 117* 115* 111  CO2 27   < > 22 18* 21* 24  GLUCOSE 98   < > 97 87 100* 100*  BUN 21   < > 36* 34* 21 15  CREATININE 0.78   < > 0.87 0.70 0.52 0.60   CALCIUM 8.5*   < > 8.3* 7.9* 8.2* 8.2*  MG 2.2  --  2.3 2.1  --   --    < > = values in this interval not displayed.   Recent Labs    11/02/21 0000 11/04/21 1047 12/11/21 0000  AST 19 20 12*  ALT '11 12 8  '$ ALKPHOS 160* 157* 137*  BILITOT  --  0.5  --   PROT  --  7.0  --   ALBUMIN 3.7 3.6 3.0*   Recent Labs    11/02/21 0000 11/04/21 1047 12/11/21 0000 02/12/22 1831 02/14/22 0425 02/15/22 0427 02/16/22 0904  WBC 4.3 4.7 4.0   < > 11.7* 9.3 9.7  NEUTROABS 2,275.00 3.2 2,352.00  --   --   --   --   HGB 11.6* 12.9 11.7*   < > 9.9* 10.4* 10.9*  HCT 36 41.6 37   < > 32.6* 34.2* 37.0  MCV  --  83.7  --    < > 85.1 84.7 86.9  PLT 243 252 179   < > 218 231 282   < > = values in this interval not displayed.   Lab Results  Component Value Date   TSH 1.198 02/15/2022   No results found for: "HGBA1C" Lab Results  Component Value Date   CHOL 147 09/27/2021   HDL 48 09/27/2021   LDLCALC 78 09/27/2021   TRIG 131 09/27/2021   CHOLHDL 3.6 06/27/2015    Significant Diagnostic Results in last 30 days:  DG C-Arm 1-60 Min-No Report  Result Date: 02/13/2022 Fluoroscopy was utilized by the requesting physician.  No radiographic interpretation.   CT L-SPINE NO CHARGE  Result Date: 02/12/2022 CLINICAL DATA:  Back pain EXAM: CT LUMBAR SPINE WITHOUT CONTRAST TECHNIQUE: Multidetector CT imaging of the lumbar spine was performed without intravenous contrast administration. Multiplanar CT image reconstructions were also generated. RADIATION DOSE REDUCTION: This exam was performed according to the departmental dose-optimization program which includes automated exposure control, adjustment of the mA and/or kV according to patient size and/or use of iterative reconstruction technique. COMPARISON:  Prior dedicated CT of the lumbar spine, correlation is made with CT abdomen pelvis 10/20/2018 FINDINGS: Segmentation: 5 lumbar type vertebrae. Alignment: Scoliosis with dextroscoliosis of the  thoracolumbar junction and compensatory levoscoliosis of the lower lumbar spine. Trace right lateral listhesis of L1 on L2 and 4 mm right lateral listhesis of L2 on L3. 4 mm left lateral listhesis of L4 on L5. Trace retrolisthesis of T12 on L1, L1 on L2, and L3 on L4. 5 mm anterolisthesis of L4 on L5, likely unchanged compared to 2017. Vertebrae: No acute fracture or suspicious osseous lesion. Compression deformity of T12 appears stable compared to 2017. Paraspinal and other soft tissues:  Please see same-day CT abdomen pelvis. Disc levels: T12-L1: Trace retrolisthesis. Facet arthropathy. No spinal canal stenosis. Severe left neural foraminal narrowing. L1-L2: Trace retrolisthesis and minimal disc bulge. Moderate facet arthropathy. No spinal canal stenosis. Mild left neural foraminal narrowing. L2-L3: Mild-to-moderate disc bulge. Moderate facet arthropathy. Mild spinal canal stenosis. Mild bilateral neural foraminal narrowing. L3-L4: Trace retrolisthesis. Mild disc bulge. Mild left and moderate right facet arthropathy. Mild spinal canal stenosis. Mild bilateral neural foraminal narrowing. L4-L5: Grade 1 anterolisthesis with disc unroofing. Moderate facet arthropathy. Ligamentum flavum hypertrophy. Moderate spinal canal stenosis. Moderate right neural foraminal narrowing. L5-S1: No significant disc bulge. Severe right right-greater-than-left facet arthropathy. No spinal canal stenosis. No neural foraminal narrowing. IMPRESSION: 1. No acute fracture or traumatic listhesis. 2. Scoliosis and multilevel degenerative changes, described in detail above. 3. L4-L5 moderate spinal canal stenosis and moderate right neural foraminal narrowing. 4. L2-L3 and L3-L4 mild spinal canal stenosis and mild bilateral neural foraminal narrowing. 5. T12-L1 severe left neural foraminal narrowing. Electronically Signed   By: Merilyn Baba M.D.   On: 02/12/2022 19:53   CT ABDOMEN PELVIS W CONTRAST  Result Date: 02/12/2022 CLINICAL DATA:   Acute abdominal pain. Back pain. Diagnosed with urinary tract infection yesterday. EXAM: CT ABDOMEN AND PELVIS WITH CONTRAST TECHNIQUE: Multidetector CT imaging of the abdomen and pelvis was performed using the standard protocol following bolus administration of intravenous contrast. RADIATION DOSE REDUCTION: This exam was performed according to the departmental dose-optimization program which includes automated exposure control, adjustment of the mA and/or kV according to patient size and/or use of iterative reconstruction technique. CONTRAST:  164m OMNIPAQUE IOHEXOL 300 MG/ML  SOLN COMPARISON:  Abdominopelvic CT 10/20/2015. FINDINGS: Lower chest: Emphysema. The thoracic aorta is tortuous. The previous pleural based left lower lobe nodule is obscured by atelectasis and small pleural effusion on the current exam. There is also a small right pleural effusion and atelectasis. Hepatobiliary: There is 6 mm hypodensity in the inferior right hepatic lobe that is too small to accurately characterize. Ill-defined low-density in the central liver at site of hemangioma on prior CT. Clips in the gallbladder fossa postcholecystectomy. No biliary dilatation. Pancreas: Scattered punctate calcifications suggesting chronic pancreatitis. There is no acute pancreatic inflammation. No ductal dilatation or pancreatic mass. Spleen: Normal in size without focal abnormality. Adrenals/Urinary Tract: Normal adrenal glands. There is an obstructing 12 x 16 mm stone in the right proximal ureter with moderate hydronephrosis and perinephric edema. Mild enhancement of the right renal pelvis and proximal ureter. The ureter distal to this is decompressed. Additional intrarenal calculi in both kidneys. There is no left hydronephrosis. No suspicious renal lesion. Urinary bladder is decompressed by suprapubic catheter, small amount of high-density material in the dependent bladder is of unknown significance, possible layering stones. Stomach/Bowel:  Detailed bowel assessment is limited in the absence of enteric contrast. The stomach is nondistended. There is no evidence of small bowel obstruction or inflammation. Enteric sutures are noted within bowel loops in the pelvis. Moderate stool in the proximal colon, small volume of stool distally. Mild gaseous distension of transverse and proximal descending colon. No colonic inflammation. Vascular/Lymphatic: Moderate to advanced aortic atherosclerosis and tortuosity. No aneurysm. There is no bulky abdominopelvic adenopathy. Prominent portal caval node measuring 9 mm is not enlarged by size criteria. Reproductive: Status post hysterectomy. No adnexal masses. Other: Presacral soft tissue thickening edema. Right perirenal stranding. No free air or ascites. Musculoskeletal: Scoliosis and diffuse degenerative change in the spine. Chronic T12 compression deformity. Postsurgical change in the left  proximal femur. No acute osseous findings. IMPRESSION: 1. Obstructing 12 x 16 mm stone in the right proximal ureter with moderate hydronephrosis and perinephric edema. Mild enhancement of the right renal pelvis and proximal ureter, may be related to renal obstruction or concurrent urinary tract infection. 2. Additional intrarenal calculi in both kidneys. 3. Suprapubic catheter in the urinary bladder. Small amount of high-density material in the dependent bladder is of unknown significance, possible layering stones. 4. Additional stable chronic findings as described. Aortic Atherosclerosis (ICD10-I70.0) and Emphysema (ICD10-J43.9). Electronically Signed   By: Keith Rake M.D.   On: 02/12/2022 19:46   DG Chest 2 View  Result Date: 02/12/2022 CLINICAL DATA:  Chest pain EXAM: CHEST - 2 VIEW COMPARISON:  Chest x-ray dated October 05, 2021 FINDINGS: Low lung volumes. Cardiac and mediastinal contours are within normal limits. Trace bilateral pleural effusions and left-greater-than-right PEs atelectasis no evidence of pneumothorax.  Severe compression deformities of the lower thoracic spine, unchanged when compared with prior. IMPRESSION: Trace bilateral pleural effusions and left-greater-than-right basilar atelectasis. Electronically Signed   By: Yetta Glassman M.D.   On: 02/12/2022 19:22    Assessment/Plan: SVT (supraventricular tachycardia) Heart rate is in control, takes Coreg  Edema not apparent, needs off Furosemide.   Adult failure to thrive under Hospice service.   Suprapubic catheter [facility]) f/u Urology, on Ditropan  Depression, psychotic ([place]) Stable, off meds.   Dementia without behavioral disturbance ([facility]) Stable,  under hospice service  Restless legs syndrome (RLS) stable, taking MiraPex.   Seizure disorder ([place]) no active seizures, off meds.   COPD (chronic obstructive pulmonary disease) (HCC) on Atrovent, Xopenx  Anemia, chronic disease Hgb 10.9 02/16/22  Hypokalemia K 3.4 02/16/22, repeat BMP  Ureteral stone with hydronephrosis Ureteral stone with hydronephrosis per CT abd  Obstructive pyelonephritis  underwent cystoscopy with right retrograde pyelogram, right ureteral stent placed, will complete 7 day course of Cefdinir '300mg'$  q12hrs, f/u Urology Dr. Tresa Moore 2 weeks Update BMP, CBC    Family/ staff Communication: plan of care reviewed with the patient and charge nurse.   Labs/tests ordered: CBC BMP 02/21/22  Time spend 35 minutes.

## 2022-02-18 NOTE — Assessment & Plan Note (Signed)
under Hospice service.

## 2022-02-18 NOTE — Assessment & Plan Note (Signed)
not apparent, needs off Furosemide.

## 2022-02-18 NOTE — Assessment & Plan Note (Signed)
Stable,  under hospice service

## 2022-02-18 NOTE — Assessment & Plan Note (Signed)
no active seizures, off meds.

## 2022-02-18 NOTE — Assessment & Plan Note (Signed)
underwent cystoscopy with right retrograde pyelogram, right ureteral stent placed, will complete 7 day course of Cefdinir '300mg'$  q12hrs, f/u Urology Dr. Tresa Moore 2 weeks Update BMP, CBC

## 2022-02-18 NOTE — Assessment & Plan Note (Signed)
K 3.4 02/16/22, repeat BMP

## 2022-02-18 NOTE — Assessment & Plan Note (Signed)
f/u Urology, on Ditropan

## 2022-02-18 NOTE — Assessment & Plan Note (Signed)
Hgb 10.9 02/16/22

## 2022-02-18 NOTE — Assessment & Plan Note (Signed)
Ureteral stone with hydronephrosis per CT abd

## 2022-02-18 NOTE — Assessment & Plan Note (Signed)
Heart rate is in control,  takes Coreg 

## 2022-02-20 ENCOUNTER — Other Ambulatory Visit: Payer: Self-pay | Admitting: Urology

## 2022-02-21 ENCOUNTER — Encounter (INDEPENDENT_AMBULATORY_CARE_PROVIDER_SITE_OTHER): Payer: Medicare PPO | Admitting: Ophthalmology

## 2022-02-21 LAB — BASIC METABOLIC PANEL WITH GFR
BUN: 11 (ref 4–21)
CO2: 27 — AB (ref 13–22)
Chloride: 108 (ref 99–108)
Creatinine: 0.6 (ref 0.5–1.1)
Glucose: 78
Potassium: 3.3 meq/L — AB (ref 3.5–5.1)
Sodium: 142 (ref 137–147)

## 2022-02-21 LAB — CBC AND DIFFERENTIAL
HCT: 32 — AB (ref 36–46)
Hemoglobin: 10.2 — AB (ref 12.0–16.0)
Neutrophils Absolute: 4082
Platelets: 329 10*3/uL (ref 150–400)
WBC: 6.3

## 2022-02-21 LAB — CBC: RBC: 3.88 (ref 3.87–5.11)

## 2022-02-21 LAB — COMPREHENSIVE METABOLIC PANEL
Calcium: 8.1 — AB (ref 8.7–10.7)
eGFR: 87

## 2022-02-26 NOTE — Progress Notes (Addendum)
COVID Vaccine Completed:  Yes  Date of COVID positive in last 90 days:  No  PCP - Man Mast, NP Cardiologist - Candee Furbish, MD (last OV 2017) Pulmonologist - Brand Males, MD  Chest x-ray - 02-12-22 Epic EKG - 02-15-22 Epic Stress Test - 2016 Epic ECHO - 2016 Epic Cardiac Cath - N/A Pacemaker/ICD device last checked: Spinal Cord Stimulator:  N/A  Bowel Prep - N/A  Sleep Study - N/A CPAP -   Fasting Blood Sugar - N/A Checks Blood Sugar _____ times a day  Last dose of GLP1 agonist-  N/A GLP1 instructions:  N/A   Last dose of SGLT-2 inhibitors-  N/A SGLT-2 instructions: N/A  Blood Thinner Instructions:  N/A Aspirin Instructions: Last Dose:  Activity level:  Patient resides in skilled nursing at [facility].  She can ambulate a few steps with a walker but mostly uses a wheelchair.  Patient states that she does have dyspnea with exertion.        Anesthesia review:   CAD, COPD, HTN, PAD, emphysema, seizure disorder (has not had a seizure in several years)  Patient denies shortness of breath, fever, cough and chest pain at PAT appointment  Patient verbalized understanding of instructions that were given to them at the PAT appointment. Patient was also instructed that they will need to review over the PAT instructions again at home before surgery.

## 2022-02-26 NOTE — Patient Instructions (Addendum)
SURGICAL WAITING ROOM VISITATION Patients having surgery or a procedure may have no more than 2 support people in the waiting area - these visitors may rotate.   Children under the age of 18 must have an adult with them who is not the patient. If the patient needs to stay at the hospital during part of their recovery, the visitor guidelines for inpatient rooms apply. Pre-op nurse will coordinate an appropriate time for 1 support person to accompany patient in pre-op.  This support person may not rotate.    Please refer to the [facility] website for the visitor guidelines for Inpatients (after your surgery is over and you are in a regular room).      Your procedure is scheduled on: 03-06-22   Report to [facility] Main Entrance    Report to admitting at 6:00 AM   Call this number if you have problems the morning of surgery [phone]   Do not eat food or drink liquids :After Midnight.          If you have questions, please contact your surgeon's office.   FOLLOW  ANY ADDITIONAL PRE OP INSTRUCTIONS YOU RECEIVED FROM YOUR SURGEON'S OFFICE!!!     Oral Hygiene is also important to reduce your risk of infection.                                    Remember - BRUSH YOUR TEETH THE MORNING OF SURGERY WITH YOUR REGULAR TOOTHPASTE   Do NOT smoke after Midnight   Take these medicines the morning of surgery with A SIP OF WATER:   Carvedilol  Oxybutynin  Ondansetron if needed  Tylenol if needed                              You may not have any metal on your body including hair pins, jewelry, and body piercing             Do not wear make-up, lotions, powders, perfumes or deodorant  Do not wear nail polish including gel and S&S, artificial/acrylic nails, or any other type of covering on natural nails including finger and toenails. If you have artificial nails, gel coating, etc. that needs to be removed by a nail salon please have this removed prior to surgery or surgery may need to  be canceled/ delayed if the surgeon/ anesthesia feels like they are unable to be safely monitored.   Do not shave  48 hours prior to surgery.    Do not bring valuables to the hospital. [address].   Contacts, dentures or bridgework may not be worn into surgery.  DO NOT [place]. PHARMACY WILL DISPENSE MEDICATIONS LISTED ON YOUR MEDICATION LIST TO YOU DURING YOUR ADMISSION [place]!    Patients discharged on the day of surgery will not be allowed to drive home.  Someone NEEDS to stay with you for the first 24 hours after anesthesia.   Special Instructions: Bring a copy of your healthcare power of attorney and living will documents the day of surgery if you haven't scanned them before.              Please read over the following fact sheets you were given: IF [place]  If you received a COVID test during your pre-op visit  it is requested that you wear a mask when out in public, stay away from anyone that may not be feeling well and notify your surgeon if you develop symptoms. If you test positive for Covid or have been in contact with anyone that has tested positive in the last 10 days please notify you surgeon.  [place] - Preparing for Surgery Before surgery, you can play an important role.  Because skin is not sterile, your skin needs to be as free of germs as possible.  You can reduce the number of germs on your skin by washing with CHG (chlorahexidine gluconate) soap before surgery.  CHG is an antiseptic cleaner which kills germs and bonds with the skin to continue killing germs even after washing. Please DO NOT use if you have an allergy to CHG or antibacterial soaps.  If your skin becomes reddened/irritated stop using the CHG and inform your nurse when you arrive at Short Stay. Do not shave (including legs and underarms) for at least 48  hours prior to the first CHG shower.  You may shave your face/neck.  Please follow these instructions carefully:  1.  Shower with CHG Soap the night before surgery and the  morning of surgery.  2.  If you choose to wash your hair, wash your hair first as usual with your normal  shampoo.  3.  After you shampoo, rinse your hair and body thoroughly to remove the shampoo.                             4.  Use CHG as you would any other liquid soap.  You can apply chg directly to the skin and wash.  Gently with a scrungie or clean washcloth.  5.  Apply the CHG Soap to your body ONLY FROM THE NECK DOWN.   Do   not use on face/ open                           Wound or open sores. Avoid contact with eyes, ears mouth and   genitals (private parts).                       Wash face,  Genitals (private parts) with your normal soap.             6.  Wash thoroughly, paying special attention to the area where your    surgery  will be performed.  7.  Thoroughly rinse your body with warm water from the neck down.  8.  DO NOT shower/wash with your normal soap after using and rinsing off the CHG Soap.                9.  Pat yourself dry with a clean towel.            10.  Wear clean pajamas.            11.  Place clean sheets on your bed the night of your first shower and do not  sleep with pets. Day of Surgery : Do not apply any lotions/deodorants the morning of surgery.  Please wear clean clothes to the hospital/surgery center.  FAILURE TO FOLLOW THESE INSTRUCTIONS MAY RESULT IN THE CANCELLATION OF YOUR SURGERY  PATIENT SIGNATURE_________________________________  NURSE SIGNATURE__________________________________  ________________________________________________________________________

## 2022-03-05 ENCOUNTER — Encounter (HOSPITAL_COMMUNITY)
Admission: RE | Admit: 2022-03-05 | Discharge: 2022-03-05 | Disposition: A | Discharge status: Home / Self Care | Source: Ambulatory Visit | Attending: Urology | Admitting: Urology

## 2022-03-05 ENCOUNTER — Other Ambulatory Visit: Payer: Self-pay

## 2022-03-05 ENCOUNTER — Encounter (HOSPITAL_COMMUNITY): Payer: Self-pay

## 2022-03-05 DIAGNOSIS — Z01818 Encounter for other preprocedural examination: Secondary | ICD-10-CM | POA: Diagnosis present

## 2022-03-05 HISTORY — DX: Atherosclerotic heart disease of native coronary artery without angina pectoris: I25.10

## 2022-03-05 HISTORY — DX: Personal history of urinary calculi: Z87.442

## 2022-03-05 HISTORY — DX: Gastro-esophageal reflux disease without esophagitis: K21.9

## 2022-03-05 HISTORY — DX: Squamous cell carcinoma of skin of unspecified lower limb, including hip: C44.721

## 2022-03-05 MED ORDER — GENTAMICIN SULFATE 40 MG/ML IJ SOLN
5.0000 mg/kg | INTRAVENOUS | Status: AC
  Administered 2022-03-06: 280 mg via INTRAVENOUS
  Filled 2022-03-05: qty 7

## 2022-03-05 NOTE — Anesthesia Preprocedure Evaluation (Signed)
Anesthesia Evaluation  Patient identified by MRN, date of birth, ID band Patient awake    Reviewed: Allergy & Precautions, NPO status , Patient's Chart, lab work & pertinent test results, reviewed documented beta blocker date and time   History of Anesthesia Complications Negative for: history of anesthetic complications  Airway Mallampati: II  TM Distance: >3 FB Neck ROM: Full    Dental  (+) Dental Advisory Given, Lower Dentures, Partial Upper   Pulmonary COPD, former smoker   Pulmonary exam normal        Cardiovascular hypertension, Pt. on medications and Pt. on home beta blockers + CAD and + Peripheral Vascular Disease  Normal cardiovascular exam     Neuro/Psych Seizures -, Well Controlled,  PSYCHIATRIC DISORDERS  Depression   Dementia  RLS     GI/Hepatic Neg liver ROS,GERD  Controlled,,  Endo/Other  negative endocrine ROS    Renal/GU negative Renal ROS     Musculoskeletal  (+) Arthritis ,    Abdominal   Peds  Hematology negative hematology ROS (+)   Anesthesia Other Findings   Reproductive/Obstetrics                             Anesthesia Physical Anesthesia Plan  ASA: 3  Anesthesia Plan: General   Post-op Pain Management:    Induction: Intravenous  PONV Risk Score and Plan: 3 and Treatment may vary due to age or medical condition, Ondansetron and Propofol infusion  Airway Management Planned: LMA  Additional Equipment: None  Intra-op Plan:   Post-operative Plan: Extubation in OR  Informed Consent: I have reviewed the patients History and Physical, chart, labs and discussed the procedure including the risks, benefits and alternatives for the proposed anesthesia with the patient or authorized representative who has indicated his/her understanding and acceptance.   Patient has DNR.  Discussed DNR with patient.   Dental advisory given  Plan Discussed with: CRNA and  Anesthesiologist  Anesthesia Plan Comments: (No chest compressions under any circumstance. Ok for IVF, vasopressors, and advanced airway management as indicated)        Anesthesia Quick Evaluation

## 2022-03-06 ENCOUNTER — Ambulatory Visit (HOSPITAL_COMMUNITY): Admitting: Physician Assistant

## 2022-03-06 ENCOUNTER — Ambulatory Visit (HOSPITAL_BASED_OUTPATIENT_CLINIC_OR_DEPARTMENT_OTHER): Admitting: Anesthesiology

## 2022-03-06 ENCOUNTER — Encounter (HOSPITAL_COMMUNITY): Payer: Self-pay | Admitting: Urology

## 2022-03-06 ENCOUNTER — Encounter (HOSPITAL_COMMUNITY)
Admission: RE | Disposition: A | Discharge status: Other Acute Inpatient Hospital | Payer: Self-pay | Source: Home / Self Care | Attending: Urology

## 2022-03-06 ENCOUNTER — Ambulatory Visit (HOSPITAL_COMMUNITY): Admission: RE | Admit: 2022-03-06 | Discharge: 2022-03-06 | Attending: Urology | Admitting: Urology

## 2022-03-06 ENCOUNTER — Ambulatory Visit (HOSPITAL_COMMUNITY)

## 2022-03-06 DIAGNOSIS — R339 Retention of urine, unspecified: Secondary | ICD-10-CM | POA: Diagnosis not present

## 2022-03-06 DIAGNOSIS — I2510 Atherosclerotic heart disease of native coronary artery without angina pectoris: Secondary | ICD-10-CM | POA: Diagnosis not present

## 2022-03-06 DIAGNOSIS — N202 Calculus of kidney with calculus of ureter: Secondary | ICD-10-CM

## 2022-03-06 DIAGNOSIS — Z87891 Personal history of nicotine dependence: Secondary | ICD-10-CM | POA: Insufficient documentation

## 2022-03-06 DIAGNOSIS — Z66 Do not resuscitate: Secondary | ICD-10-CM | POA: Insufficient documentation

## 2022-03-06 DIAGNOSIS — J449 Chronic obstructive pulmonary disease, unspecified: Secondary | ICD-10-CM | POA: Diagnosis not present

## 2022-03-06 DIAGNOSIS — I10 Essential (primary) hypertension: Secondary | ICD-10-CM

## 2022-03-06 HISTORY — PX: HOLMIUM LASER APPLICATION: SHX5852

## 2022-03-06 HISTORY — PX: CYSTOSCOPY WITH RETROGRADE PYELOGRAM, URETEROSCOPY AND STENT PLACEMENT: SHX5789

## 2022-03-06 SURGERY — CYSTOURETEROSCOPY, WITH RETROGRADE PYELOGRAM AND STENT INSERTION
Anesthesia: General | Site: Ureter | Laterality: Right

## 2022-03-06 MED ORDER — PHENYLEPHRINE 80 MCG/ML (10ML) SYRINGE FOR IV PUSH (FOR BLOOD PRESSURE SUPPORT)
PREFILLED_SYRINGE | INTRAVENOUS | Status: AC
  Filled 2022-03-06: qty 10

## 2022-03-06 MED ORDER — CEPHALEXIN 500 MG PO CAPS: 500.0000 mg | ORAL_CAPSULE | Freq: Two times a day (BID) | ORAL | 0 refills | Status: AC

## 2022-03-06 MED ORDER — ONDANSETRON HCL 4 MG/2ML IJ SOLN
INTRAMUSCULAR | Status: AC
  Filled 2022-03-06: qty 2

## 2022-03-06 MED ORDER — ONDANSETRON HCL 4 MG/2ML IJ SOLN: 4.0000 mg | Freq: Once | INTRAMUSCULAR | Status: DC | PRN

## 2022-03-06 MED ORDER — FENTANYL CITRATE PF 50 MCG/ML IJ SOSY
25.0000 ug | PREFILLED_SYRINGE | INTRAMUSCULAR | Status: DC | PRN
  Administered 2022-03-06 (×2): 25 ug via INTRAVENOUS

## 2022-03-06 MED ORDER — ORAL CARE MOUTH RINSE: 15.0000 mL | Freq: Once | OROMUCOSAL | Status: AC

## 2022-03-06 MED ORDER — LACTATED RINGERS IV SOLN: INTRAVENOUS | Status: DC

## 2022-03-06 MED ORDER — PHENYLEPHRINE HCL-NACL 20-0.9 MG/250ML-% IV SOLN
INTRAVENOUS | Status: DC | PRN
  Administered 2022-03-06: 50 ug/min via INTRAVENOUS

## 2022-03-06 MED ORDER — TRAMADOL HCL 50 MG PO TABS: 50.0000 mg | ORAL_TABLET | Freq: Three times a day (TID) | ORAL | 0 refills | Status: DC | PRN

## 2022-03-06 MED ORDER — FENTANYL CITRATE PF 50 MCG/ML IJ SOSY
PREFILLED_SYRINGE | INTRAMUSCULAR | Status: AC
  Filled 2022-03-06: qty 1

## 2022-03-06 MED ORDER — PHENYLEPHRINE HCL (PRESSORS) 10 MG/ML IV SOLN
INTRAVENOUS | Status: AC
  Filled 2022-03-06: qty 1

## 2022-03-06 MED ORDER — PROPOFOL 10 MG/ML IV BOLUS
INTRAVENOUS | Status: AC
  Filled 2022-03-06: qty 20

## 2022-03-06 MED ORDER — DEXAMETHASONE SODIUM PHOSPHATE 10 MG/ML IJ SOLN
INTRAMUSCULAR | Status: AC
  Filled 2022-03-06: qty 1

## 2022-03-06 MED ORDER — PHENYLEPHRINE 80 MCG/ML (10ML) SYRINGE FOR IV PUSH (FOR BLOOD PRESSURE SUPPORT)
PREFILLED_SYRINGE | INTRAVENOUS | Status: DC | PRN
  Administered 2022-03-06: 160 ug via INTRAVENOUS

## 2022-03-06 MED ORDER — SODIUM CHLORIDE 0.9 % IR SOLN
Status: DC | PRN
  Administered 2022-03-06: 6000 mL

## 2022-03-06 MED ORDER — LIDOCAINE HCL (PF) 2 % IJ SOLN
INTRAMUSCULAR | Status: AC
  Filled 2022-03-06: qty 5

## 2022-03-06 MED ORDER — IOHEXOL 300 MG/ML  SOLN
INTRAMUSCULAR | Status: DC | PRN
  Administered 2022-03-06: 17 mL

## 2022-03-06 MED ORDER — CARVEDILOL 3.125 MG PO TABS
3.1250 mg | ORAL_TABLET | ORAL | Status: AC
  Administered 2022-03-06: 3.125 mg via ORAL
  Filled 2022-03-06: qty 1

## 2022-03-06 MED ORDER — PROPOFOL 10 MG/ML IV BOLUS
INTRAVENOUS | Status: DC | PRN
  Administered 2022-03-06: 100 mg via INTRAVENOUS

## 2022-03-06 MED ORDER — LIDOCAINE 2% (20 MG/ML) 5 ML SYRINGE
INTRAMUSCULAR | Status: DC | PRN
  Administered 2022-03-06: 40 mg via INTRAVENOUS

## 2022-03-06 MED ORDER — OXYCODONE HCL 5 MG PO TABS: 5.0000 mg | ORAL_TABLET | Freq: Once | ORAL | Status: DC | PRN

## 2022-03-06 MED ORDER — CHLORHEXIDINE GLUCONATE 0.12 % MT SOLN
15.0000 mL | Freq: Once | OROMUCOSAL | Status: AC
  Administered 2022-03-06: 15 mL via OROMUCOSAL

## 2022-03-06 MED ORDER — FENTANYL CITRATE (PF) 100 MCG/2ML IJ SOLN
INTRAMUSCULAR | Status: DC | PRN
  Administered 2022-03-06 (×4): 25 ug via INTRAVENOUS

## 2022-03-06 MED ORDER — OXYCODONE HCL 5 MG/5ML PO SOLN: 5.0000 mg | Freq: Once | ORAL | Status: DC | PRN

## 2022-03-06 MED ORDER — STERILE WATER FOR IRRIGATION IR SOLN
Status: DC | PRN
  Administered 2022-03-06: 250 mL

## 2022-03-06 MED ORDER — FENTANYL CITRATE (PF) 100 MCG/2ML IJ SOLN
INTRAMUSCULAR | Status: AC
  Filled 2022-03-06: qty 2

## 2022-03-06 MED ORDER — ONDANSETRON HCL 4 MG/2ML IJ SOLN
INTRAMUSCULAR | Status: DC | PRN
  Administered 2022-03-06: 4 mg via INTRAVENOUS

## 2022-03-06 MED ORDER — DEXAMETHASONE SODIUM PHOSPHATE 10 MG/ML IJ SOLN
INTRAMUSCULAR | Status: DC | PRN
  Administered 2022-03-06: 5 mg via INTRAVENOUS

## 2022-03-06 SURGICAL SUPPLY — 28 items
BAG URO CATCHER STRL LF (MISCELLANEOUS) ×2 IMPLANT
BASKET LASER NITINOL 1.9FR (BASKET) IMPLANT
BASKET STONE NCOMPASS (UROLOGICAL SUPPLIES) IMPLANT
CATH SILICONE 14FRX5CC (CATHETERS) IMPLANT
CATH URETL OPEN END 6FR 70 (CATHETERS) ×2 IMPLANT
CLOTH BEACON ORANGE TIMEOUT ST (SAFETY) ×2 IMPLANT
EXTRACTOR STONE 1.7FRX115CM (UROLOGICAL SUPPLIES) IMPLANT
GLOVE SURG LX STRL 7.5 STRW (GLOVE) ×2 IMPLANT
GOWN SRG XL LVL 4 BRTHBL STRL (GOWNS) ×2 IMPLANT
GOWN STRL NON-REIN XL LVL4 (GOWNS) ×2
GOWN STRL REUS W/ TWL XL LVL3 (GOWN DISPOSABLE) ×2 IMPLANT
GOWN STRL REUS W/TWL XL LVL3 (GOWN DISPOSABLE) ×2
GUIDEWIRE ANG ZIPWIRE 038X150 (WIRE) ×2 IMPLANT
GUIDEWIRE STR DUAL SENSOR (WIRE) ×2 IMPLANT
KIT TURNOVER KIT A (KITS) IMPLANT
LASER FIB FLEXIVA PULSE ID 365 (Laser) IMPLANT
LASER FIB FLEXIVA PULSE ID 550 (Laser) IMPLANT
LASER FIB FLEXIVA PULSE ID 910 (Laser) IMPLANT
MANIFOLD NEPTUNE II (INSTRUMENTS) ×2 IMPLANT
PACK CYSTO (CUSTOM PROCEDURE TRAY) ×2 IMPLANT
SHEATH NAVIGATOR HD 11/13X28 (SHEATH) IMPLANT
SHEATH NAVIGATOR HD 11/13X36 (SHEATH) IMPLANT
STENT POLARIS 5FRX26 (STENTS) IMPLANT
TRACTIP FLEXIVA PULS ID 200XHI (Laser) IMPLANT
TRACTIP FLEXIVA PULSE ID 200 (Laser) ×2
TUBE FEEDING 8FR 16IN STR KANG (MISCELLANEOUS) ×2 IMPLANT
TUBING CONNECTING 10 (TUBING) ×2 IMPLANT
TUBING UROLOGY SET (TUBING) ×2 IMPLANT

## 2022-03-06 NOTE — Anesthesia Procedure Notes (Signed)
Procedure Name: LMA Insertion Date/Time: 03/06/2022 8:27 AM  Performed by: Sharlette Dense, CRNAPatient Re-evaluated:Patient Re-evaluated prior to induction Oxygen Delivery Method: Circle system utilized Preoxygenation: Pre-oxygenation with 100% oxygen Induction Type: IV induction LMA: LMA inserted LMA Size: 4.0 Number of attempts: 1 Placement Confirmation: positive ETCO2 and breath sounds checked- equal and bilateral Tube secured with: Tape Dental Injury: Teeth and Oropharynx as per pre-operative assessment

## 2022-03-06 NOTE — Anesthesia Postprocedure Evaluation (Signed)
Anesthesia Post Note  Patient: [name]  Procedure(s) Performed: CYSTOSCOPY WITH RETROGRADE PYELOGRAM, URETEROSCOPY AND STENT REPLACEMENT, SUPRAPUBIC CATHETER EXCHANGE (Right: Ureter) HOLMIUM LASER APPLICATION (Right)     Patient location during evaluation: PACU Anesthesia Type: General Level of consciousness: awake and alert Pain management: pain level controlled Vital Signs Assessment: post-procedure vital signs reviewed and stable Respiratory status: spontaneous breathing, nonlabored ventilation and respiratory function stable Cardiovascular status: stable and blood pressure returned to baseline Anesthetic complications: no   No notable events documented.  Last Vitals:  Vitals:   03/06/22 1100 03/06/22 1115  BP: 118/78 124/68  Pulse: 64 68  Resp: 19 18  Temp: 36.4 C 36.5 C  SpO2: 93% 90%    Last Pain:  Vitals:   03/06/22 1147  TempSrc:   PainSc: 0-No pain                 Audry Pili

## 2022-03-06 NOTE — H&P (Signed)
[name] is an 88 y.o. female.    Chief Complaint: Pre-OP RIGHT Ureteroscopic Stone Manipulation  HPI:   1 - Right Ureteral Stone - s/p Rt ureteral stent placement 02/13/22 for 44m Rt prox ureteral sotne with mild hydro, few scattered small Rt renal stones at time of early urosepsis. [place] non[place] and cleared infectiosu parameters on rocephin.   2 - Incomplete Bladder Emptying / Urethral Stricture / Chronic SP Tube - s/p SP Tube palcement 10/2021 for retention and partial urethral stricture. Now prefers for QOL.   PMH sig for seizures, COPD (no O2), failure to thrive. No chest/abd surgeries. She is DNR but makes all her own decisions, lives in skilled nursing section [facility]. [facility]. Retired tPharmacist, hospitalthen hImmunologistat [place]    Today "[name] is seen to proceed with RIGHT ureteroscopic stone manipulation. No interval fevers.   Past Medical History:  Diagnosis Date   Acute bronchitis 05/23/2011   Acute upper respiratory infections of unspecified site 05/23/2011   Arthritis    Chronic airway obstruction, not elsewhere classified 05/23/2011   Coronary artery disease    Disturbance of salivary secretion 01/31/2011   Dizziness and giddiness 01/31/2011   Dyspnea    Essential tremor 04/25/2014   External hemorrhoids without mention of complication 122/05/5425  Gait disorder 04/25/2014   GERD (gastroesophageal reflux disease)    History of kidney stones    Insomnia, unspecified 09/12/2011   Lumbago 01/31/2011   Major depressive disorder, single episode, unspecified 01/31/2011   Memory disorder 04/25/2014   Mitral valve disorders(424.0) 01/31/2011   Other and unspecified hyperlipidemia 01/31/2011   Other convulsions 01/31/2011   Other emphysema ([place] 01/31/2011   Pain in joint, site unspecified 01/31/2011   Restless legs syndrome (RLS) 09/12/2011   Retinal detachment with retinal defect of right eye 2011    right eye twice   Seizure disorder ([place]    Senile osteoporosis 01/31/2011   Spontaneous ecchymoses 01/31/2011   Squamous cell carcinoma of leg    Stiffness of joints, not elsewhere classified, multiple sites 01/31/2011   Unspecified essential hypertension 01/31/2011    Past Surgical History:  Procedure Laterality Date   ABDOMINAL HYSTERECTOMY  06/21/2003   TAH/BSO, omenectomy PSB resect, Stg IC cystadenofibroma   CHOLECYSTECTOMY  2005   Dr. TMarlou Starks  CYSTOSCOPY W/ URETERAL STENT PLACEMENT Right 02/13/2022   Procedure: CYSTOSCOPY WITH RETROGRADE PYELOGRAM/URETERAL STENT PLACEMENT, AND SUPRAPUBIC TUBE EXCHANGE;  Surgeon: MAlexis Frock MD;  Location: [facility];  Service: Urology;  Laterality: Right;   ELBOW SURGERY Right 2008   broken   Dr. OApolonio Schneiders  EYE SURGERY     INTRAMEDULLARY (IM) NAIL INTERTROCHANTERIC Left 08/30/2021   Procedure: INTRAMEDULLARY (IM) NAIL INTERTROCHANTRIC;  Surgeon: SRod Can MD;  Location: [facility];  Service: Orthopedics;  Laterality: Left;   ORIF PATELLA Left 05/02/2020   Procedure: OPEN REDUCTION INTERNAL (ORIF) FIXATION LEFT PATELLA WITH MEDIAL AND LATERAL LIGAMENT REINFORCEMENTS;  Surgeon: MRenette Butters MD;  Location: [facility];  Service: Orthopedics;  Laterality: Left;   ORIF PATELLA Left 05/30/2020   Procedure: OPEN REDUCTION INTERNAL (ORIF) FIXATION PATELLA;  Surgeon: MRenette Butters MD;  Location: [facility];  Service: Orthopedics;  Laterality: Left;   RETINAL DETACHMENT SURGERY N/A    two   REVERSE SHOULDER ARTHROPLASTY Left 05/06/2019   Procedure: REVERSE SHOULDER ARTHROPLASTY;  Surgeon: SJustice Britain MD;  Location: [facility];  Service: Orthopedics;  Laterality: Left;  169mn   ROTATOR CUFF REPAIR Right 2012   Dr. CTheda Sers  SQUAMOUS CELL CARCINOMA EXCISION Bilateral 2012, 8/14   [place] on legs   Dr. GSarajane Jews  TONSILLECTOMY  1941   VIDEO BRONCHOSCOPY WITH ENDOBRONCHIAL NAVIGATION N/A 11/29/2015   Procedure: VIDEO BRONCHOSCOPY WITH ENDOBRONCHIAL  NAVIGATION;  Surgeon: RCollene Gobble MD;  Location: [facility] OR;  Service: Thoracic;  Laterality: N/A;    Family History  Problem Relation Age of Onset   Heart disease Father        CHF   Cancer Mother        breast   Seizures Sister    Social History:  reports that she quit smoking about 33 years ago. Her smoking use included cigarettes. She has a 40.00 pack-year smoking history. She has never used smokeless tobacco. She reports that she does not drink alcohol and does not use drugs.  Allergies:  Allergies  Allergen Reactions   Vioxx [Rofecoxib] Shortness Of Breath   Dyazide [Hydrochlorothiazide W-Triamterene] Other (See Comments)    Lowers blood pressure too much   Latex Swelling and Other (See Comments)    "Allergic," per MAR   Sulfa Antibiotics Nausea And Vomiting and Other (See Comments)    [facility]  Sumycin [Tetracycline] Other (See Comments)    Can't take due to drug reaction- [facility]   Medications Prior to Admission  Medication Sig Dispense Refill   acetaminophen (TYLENOL) 325 MG tablet Take 650 mg by mouth every 4 (four) hours as needed for fever or mild pain.     albuterol (ACCUNEB) 1.25 MG/3ML nebulizer solution Take 1 ampule by nebulization every 6 (six) hours as needed for shortness of breath.     Albuterol Sulfate (PROAIR RESPICLICK) 1938(90 Base) MCG/ACT AEPB Inhale 2 puffs into the lungs daily as needed (shortness of breath or wheezing).     carvedilol (COREG) 3.125 MG tablet Take 1 tablet (3.125 mg total) by mouth 2 (two) times daily with a meal. 60 tablet 1   fluticasone (VERAMYST) 27.5 MCG/SPRAY nasal spray Place 1 spray into the nose daily.     hydrocortisone 2.5 % lotion Apply 1 application  topically 2 (two) times daily as needed (face and ears as need for psoriasis).     hydrocortisone cream 1 % Apply 1 application  topically every 12 (twelve) hours as needed for itching (dry skin related to psoriasis).     ketoconazole (NIZORAL) 2 %  cream Apply 1 application  topically daily as needed (psoriasis).     ketoconazole (NIZORAL) 2 % shampoo Apply 1 application  topically See admin instructions. Apply topically to scalp on Mondays and Thursdays for hair care     nystatin (MYCOSTATIN/NYSTOP) powder Apply 1 application  topically every 12 (twelve) hours as needed (urogenital area).     ondansetron (ZOFRAN) 4 MG tablet Take 1 tablet (4 mg total) by mouth every 6 (six) hours as needed for nausea. 20 tablet 0   oxybutynin (DITROPAN) 5 MG tablet Take 1 tablet (5 mg total) by mouth 3 (three) times daily. 90 tablet 1   pramipexole (MIRAPEX) 0.5 MG tablet Take 0.5 mg by mouth at bedtime.     triamcinolone (KENALOG) 0.1 % Apply 1 application. topically daily as needed (psoriasis).     ipratropium (ATROVENT) 0.02 % nebulizer solution Take 2.5 mLs (0.5 mg total) by nebulization 3 (three) times daily for 3 days, THEN 2.5 mLs (0.5 mg total) every 6 (six) hours as needed  for up to 3 days. (Patient not taking: Reported on 02/27/2022) 75 mL 2   levalbuterol (XOPENEX) 0.63 MG/3ML nebulizer solution Take 3 mLs (0.63 mg total) by nebulization 3 (three) times daily for 3 days, THEN 3 mLs (0.63 mg total) every 6 (six) hours as needed for up to 3 days. (Patient not taking: Reported on 02/27/2022) 3 mL 2    No results found for this or any previous visit (from the past 48 hour(s)). No results found.  Review of Systems  Constitutional:  Negative for chills and fever.  All other systems reviewed and are negative.   Weight 66 kg, last menstrual period 02/16/1975. Physical Exam Vitals reviewed.  Constitutional:      Comments: Mentally spry but physically very frail, at baseline  HENT:     Head: Normocephalic.  Eyes:     Pupils: Pupils are equal, round, and reactive to light.  Cardiovascular:     Rate and Rhythm: Normal rate.  Pulmonary:     Effort: Pulmonary effort is normal.  Abdominal:     General: Abdomen is flat.     Comments: SPT in place   Genitourinary:    Comments: No CVAT at present Musculoskeletal:        General: Normal range of motion.  Skin:    General: Skin is warm.      Assessment/Plan  Proceed as planned with RIGHT ureteroscopic stone maniuplation, will exchange SPT as well. RIsks, benefits, alternatives, expected peri-op course discussed previously and reiterated today. Her baseline frailty and advanced age increases risk of ALL peri-op complications including mortality. She has good understanding of competing risks.   Alexis Frock, MD 03/06/2022, 6:33 AM

## 2022-03-06 NOTE — Op Note (Signed)
[name], [name] MEDICAL RECORD NO: [id number] ACCOUNT NO: [id number] DATE OF BIRTH: 1933/03/04 FACILITY: [facility] LOCATION: [facility] PHYSICIAN: Alexis Frock, MD  Operative Report   DATE OF PROCEDURE: 03/06/2022  PREOPERATIVE DIAGNOSIS:  Right renal and ureteral stones.  History of urosepsis.  PROCEDURE PERFORMED: 1.  Cystoscopy, right retrograde pyelogram with interpretation. 2.  Right ureteroscopy with laser lithotripsy. 3.  Exchange of right ureteral stent. 4.  Exchange of suprapubic tube.  ESTIMATED BLOOD LOSS:  Nil.  COMPLICATIONS:  None.  SPECIMEN:  Right renal and ureteral stone fragments for composition analysis.  FINDINGS: 1.  Large right UPJ stone. 2.  Multifocal right upper mid renal stones, total volume approximately 2 cm. 3.  Complete resolution of all accessible stone fragments larger than one-third mm following laser lithotripsy and basket extraction. 4.  Successful replacement of right ureteral stent, proximal end in the renal pelvis, distal end in urinary bladder, with tether fashioned to the suprapubic tube internalized. 5.  Replacement of the suprapubic tube in the urinary bladder.  A silicone type [id number].  INDICATIONS:  The patient is a very pleasant, but quite frail 88 year old lady who [facility] who was found on workup with flank pain and questionable infectious parameters to have a right UPJ stone earlier this  month.  She underwent stenting at that time for renal drainage and actually did become somewhat septic preoperatively.  Keeping with presumed diagnosis of obstructing pyelonephritis she cleared infectious parameters on intravenous bridged to oral  antibiotic therapy and now presents for definitive stone management.  Informed consent was obtained and placed in medical record.  PROCEDURE IN DETAIL:  The patient being [name] verified and the procedure being right ureteroscopic stone manipulation was  confirmed.  Procedure timeout was performed.  Intravenous antibiotics were administered.  General LMA anesthesia was induced.   The patient was placed into a low lithotomy position.  Sterile field was created, prepped and draped the patient's vagina, introitus, and proximal thighs using iodine, her in situ SP tube was previously removed before prepping.  A new [id number]  silicone catheter was placed via the SP tract, 10 mL of water in the balloon.  Cystoscopy corroborated good placement, distal end of the right stent was grasped, brought to the level of the urethral meatus.  A 0.03 ZIPwire was then slowly pulled.  Stent  was exchanged for open-ended catheter and right retrograde pyelogram was obtained.  Right retrograde pyelogram showed single right ureter, single system right kidney.  There was a calcification and filling defect in the area of the UPJ consistent with known stone.  This was quite large.  A ZIPwire was once again advanced and set aside  as a safety wire.  SP tube was allowed to straight drain for pressure release and semirigid ureteroscopy was performed of the entire length right ureter alongside a separate sensor working wire.  No mucosal abnormalities were found.  The semirigid scope  was exchanged for a short length, ureteral access sheath to the level of proximal ureter using continuous fluoroscopic guidance and flexible digital ureteroscopy was performed of the proximal ureter and systematic inspection of the right kidney as well  as calices x3.  There was a dominant fairly large UPJ stone noted.  This was at least 60 mm.  There were two additional foci of stone upper mid pole estimated to be approximately 68 mm each.  These appeared to be much too large for simple basketing.  As  such,  holmium laser energy applied to stone using escalating settings up to 0.3 joules and 40 Hz.  Using dusting technique estimated 60% of the stone volume was dusted remaining 40% fragmented with fragments  being amenable to simple basketing with Escape  basket and fragments were removed, set aside for composition analysis and the encompass basket was also used to gather smaller stone fragments such that all fragments larger than one-third mm had been removed.  No additional calcifications were noted.   Access sheath was removed under continuous vision, no significant mucosal measure found.  Given large volume stone, it was felt that interval stenting would be warranted until her next followup visit.  A new 5 x 26 Polaris type stent was placed using  cystoscopic and fluoroscopic guidance with tethers left in place.  The suprapubic tube was navigated out of urethra and the stent string was fashioned to this with approximately 3-inch tether length trimmed brought back into the urinary bladder.  The SP  tube once again being filled such that the right stent was internalized with tether to this to make extraction easiest on her as her habitus is challenging.  The SP tube was connected to leg bag drainage, procedure was terminated.  The patient tolerated  the procedure well, no immediate complications.  The patient was taken to postanesthesia care in stable condition.  [facility].   SHY D: 03/06/2022 9:49:15 am T: 03/06/2022 11:35:00 am  JOB: [id number]/ [id number]

## 2022-03-06 NOTE — Discharge Instructions (Signed)
1 - You may have urinary urgency (bladder spasms) and bloody urine on / off with stent in place. This is normal. ° °2 - Call MD or go to ER for fever >102, severe pain / nausea / vomiting not relieved by medications, or acute change in medical status ° °

## 2022-03-06 NOTE — Brief Op Note (Signed)
03/06/2022  9:43 AM  PATIENT:  [name]  88 y.o. female  PRE-OPERATIVE DIAGNOSIS:  RIGHT RENAL AND URETERAL STONES  POST-OPERATIVE DIAGNOSIS:  RIGHT RENAL AND URETERAL STONES  PROCEDURE:  Procedure(s): CYSTOSCOPY WITH RETROGRADE PYELOGRAM, URETEROSCOPY AND STENT REPLACEMENT, SUPRAPUBIC CATHETER EXCHANGE (Right) HOLMIUM LASER APPLICATION (Right)  SURGEON:  Surgeon(s) and Role:    Alexis Frock, MD - Primary  PHYSICIAN ASSISTANT:   ASSISTANTS: none   ANESTHESIA:   general  EBL:  minimal   BLOOD ADMINISTERED:none  DRAINS:  46F SP Tube to gravity    LOCAL MEDICATIONS USED:  NONE  SPECIMEN:  Source of Specimen:  Rt renal stone fragments  DISPOSITION OF SPECIMEN:   [facility] for compositional analysis  COUNTS:  YES  TOURNIQUET:  * No tourniquets in log *  DICTATION: .Other Dictation: Dictation Number [id number]  PLAN OF CARE: Discharge to home after PACU  PATIENT DISPOSITION:  PACU - hemodynamically stable.   Delay start of Pharmacological VTE agent (>24hrs) due to surgical blood loss or risk of bleeding: no

## 2022-03-06 NOTE — Transfer of Care (Signed)
Immediate Anesthesia Transfer of Care Note  Patient: [name]  Procedure(s) Performed: CYSTOSCOPY WITH RETROGRADE PYELOGRAM, URETEROSCOPY AND STENT REPLACEMENT, SUPRAPUBIC CATHETER EXCHANGE (Right: Ureter) HOLMIUM LASER APPLICATION (Right)  Patient Location: PACU  Anesthesia Type:General  Level of Consciousness: awake  Airway & Oxygen Therapy: Patient Spontanous Breathing and Patient connected to face mask oxygen  Post-op Assessment: Report given to RN and Post -op Vital signs reviewed and stable  Post vital signs: Reviewed and stable  Last Vitals:  Vitals Value Taken Time  BP 146/87 03/06/22 0956  Temp    Pulse 63 03/06/22 0957  Resp 15 03/06/22 0957  SpO2 100 % 03/06/22 0957  Vitals shown include unvalidated device data.  Last Pain:  Vitals:   03/06/22 0725  TempSrc:   PainSc: 0-No pain         Complications: No notable events documented.

## 2022-03-07 ENCOUNTER — Encounter (HOSPITAL_COMMUNITY): Payer: Self-pay | Admitting: Urology

## 2022-03-07 ENCOUNTER — Other Ambulatory Visit: Payer: Self-pay | Admitting: Nurse Practitioner

## 2022-03-07 MED ORDER — TRAMADOL HCL 50 MG PO TABS: 50.0000 mg | ORAL_TABLET | Freq: Three times a day (TID) | ORAL | 0 refills | Status: DC | PRN

## 2022-03-11 ENCOUNTER — Non-Acute Institutional Stay (SKILLED_NURSING_FACILITY): Payer: Medicare PPO | Admitting: Nurse Practitioner

## 2022-03-11 DIAGNOSIS — D638 Anemia in other chronic diseases classified elsewhere: Secondary | ICD-10-CM

## 2022-03-11 DIAGNOSIS — I471 Supraventricular tachycardia, unspecified: Secondary | ICD-10-CM | POA: Diagnosis not present

## 2022-03-11 DIAGNOSIS — E876 Hypokalemia: Secondary | ICD-10-CM

## 2022-03-11 DIAGNOSIS — J431 Panlobular emphysema: Secondary | ICD-10-CM

## 2022-03-11 DIAGNOSIS — N139 Obstructive and reflux uropathy, unspecified: Secondary | ICD-10-CM

## 2022-03-11 DIAGNOSIS — N132 Hydronephrosis with renal and ureteral calculous obstruction: Secondary | ICD-10-CM

## 2022-03-11 DIAGNOSIS — R627 Adult failure to thrive: Secondary | ICD-10-CM

## 2022-03-11 DIAGNOSIS — R609 Edema, unspecified: Secondary | ICD-10-CM

## 2022-03-11 DIAGNOSIS — G2581 Restless legs syndrome: Secondary | ICD-10-CM

## 2022-03-12 ENCOUNTER — Encounter: Payer: Self-pay | Admitting: Nurse Practitioner

## 2022-03-12 DIAGNOSIS — E876 Hypokalemia: Secondary | ICD-10-CM | POA: Diagnosis not present

## 2022-03-12 NOTE — Assessment & Plan Note (Signed)
Heart rate is in control,  takes Coreg 

## 2022-03-12 NOTE — Progress Notes (Addendum)
Location:   SNF [place] Room Number: 37 Place of Service:  SNF (31) Provider: Lennie Odor Yaneisy Wenz NP  Jaleena Viviani X, NP  Patient Care Team: Comfort Iversen X, NP as PCP - General (Internal Medicine) [name], MD as Consulting Physician (Ophthalmology) [name], MD as Consulting Physician (Cardiology) [name], MD as Consulting Physician (Dermatology) [name], MD as Consulting Physician (Orthopedic Surgery) [name], MD as Consulting Physician (Orthopedic Surgery) [name], MD as Consulting Physician (Orthopedic Surgery) [place], Cook, Cicero Noy X, NP as Nurse Practitioner (Nurse Practitioner) [name], MD (Inactive) as Consulting Physician (Neurology)  Extended Emergency Contact Information Primary Emergency Contact: [name],[name] Address: [address]          [place], [place] [address] [name] of [place] Phone: [phone] Relation: Sister Secondary Emergency Contact: [name] [place] of [place] Phone: [phone] Mobile Phone: [phone] Relation: Sister  Code Status: DNR Goals of care: Advanced Directive information    03/05/2022   11:32 AM  Advanced Directives  Does Patient Have a Medical Advance Directive? Yes  Type of Paramedic of [place];Living will  Copy of [place] in Chart? No - copy requested     Chief Complaint  Patient presents with   Medical Management of Chronic Issues    HPI:  Pt is a 88 y.o. female seen today for managing chronic medical conditions.                02/12/22 Obstructive nephritis, underwent cystoscopy with right retrograde pyelogram, right ureteral stent placed, f/u Urology Dr. Tresa Moore, 03/06/22 cystoscopy with retrograde pyelogram, ureteroscopy and stent replacement, SPC exchange.              Ureteral stone with hydronephrosis per CT abd             Hypokalemia, K 3.3 02/21/22              Anemia, Hgb 10.3 02/21/22             SVT, takes Coreg             BLE edema, not apparent, off Furosemide.              Adult failure to thrive, under Hospice service.              Suprapubic Catheter, f/u Urology, on Ditropan             Depression, stable             Dementia, under hospice service             Restless leg syndrome, stable, taking MiraPex.              Hx of seizures, no active seizures, off meds.              COPD, on Atrovent, Xopenx      Past Medical History:  Diagnosis Date   Acute bronchitis 05/23/2011   Acute upper respiratory infections of unspecified site 05/23/2011   Arthritis    Chronic airway obstruction, not elsewhere classified 05/23/2011   Coronary artery disease    Disturbance of salivary secretion 01/31/2011   Dizziness and giddiness 01/31/2011   Dyspnea    Essential tremor 04/25/2014   External hemorrhoids without mention of complication 26/83/4196   Gait disorder 04/25/2014   GERD (gastroesophageal reflux disease)  History of kidney stones    Insomnia, unspecified 09/12/2011   Lumbago 01/31/2011   Major depressive disorder, single episode, unspecified 01/31/2011   Memory disorder 04/25/2014   Mitral valve disorders(424.0) 01/31/2011   Other and unspecified hyperlipidemia 01/31/2011   Other convulsions 01/31/2011   Other emphysema ([place]) 01/31/2011   Pain in joint, site unspecified 01/31/2011   Restless legs syndrome (RLS) 09/12/2011   Retinal detachment with retinal defect of right eye 2011   right eye twice   Seizure disorder ([place])    Senile osteoporosis 01/31/2011   Spontaneous ecchymoses 01/31/2011   Squamous cell carcinoma of leg    Stiffness of joints, not elsewhere classified, multiple sites 01/31/2011   Unspecified essential hypertension 01/31/2011   Past Surgical History:  Procedure Laterality Date   ABDOMINAL HYSTERECTOMY  06/21/2003   TAH/BSO, omenectomy PSB resect, Stg IC cystadenofibroma   CHOLECYSTECTOMY  2005    Dr. Marlou Starks   CYSTOSCOPY W/ URETERAL STENT PLACEMENT Right 02/13/2022   Procedure: CYSTOSCOPY WITH RETROGRADE PYELOGRAM/URETERAL STENT PLACEMENT, AND SUPRAPUBIC TUBE EXCHANGE;  Surgeon: Alexis Frock, MD;  Location: [facility];  Service: Urology;  Laterality: Right;   CYSTOSCOPY WITH RETROGRADE PYELOGRAM, URETEROSCOPY AND STENT PLACEMENT Right 03/06/2022   Procedure: CYSTOSCOPY WITH RETROGRADE PYELOGRAM, URETEROSCOPY AND STENT REPLACEMENT, SUPRAPUBIC CATHETER EXCHANGE;  Surgeon: Alexis Frock, MD;  Location: [facility];  Service: Urology;  Laterality: Right;   ELBOW SURGERY Right 2008   broken   Dr. Apolonio Schneiders   EYE SURGERY     HOLMIUM LASER APPLICATION Right 88/50/2774   Procedure: HOLMIUM LASER APPLICATION;  Surgeon: Alexis Frock, MD;  Location: [facility];  Service: Urology;  Laterality: Right;   INTRAMEDULLARY (IM) NAIL INTERTROCHANTERIC Left 08/30/2021   Procedure: INTRAMEDULLARY (IM) NAIL INTERTROCHANTRIC;  Surgeon: Rod Can, MD;  Location: [facility];  Service: Orthopedics;  Laterality: Left;   ORIF PATELLA Left 05/02/2020   Procedure: OPEN REDUCTION INTERNAL (ORIF) FIXATION LEFT PATELLA WITH MEDIAL AND LATERAL LIGAMENT REINFORCEMENTS;  Surgeon: [name], MD;  Location: [facility];  Service: Orthopedics;  Laterality: Left;   ORIF PATELLA Left 05/30/2020   Procedure: OPEN REDUCTION INTERNAL (ORIF) FIXATION PATELLA;  Surgeon: [name], MD;  Location: [facility];  Service: Orthopedics;  Laterality: Left;   RETINAL DETACHMENT SURGERY N/A    two   REVERSE SHOULDER ARTHROPLASTY Left 05/06/2019   Procedure: REVERSE SHOULDER ARTHROPLASTY;  Surgeon: Justice Britain, MD;  Location: [facility];  Service: Orthopedics;  Laterality: Left;  199mn   ROTATOR CUFF REPAIR Right 2012   Dr. CTheda Sers  SQUAMOUS CELL CARCINOMA EXCISION Bilateral 2012, 8/14   [place] on legs   Dr. GSarajane Jews  TONSILLECTOMY  1941   VIDEO BRONCHOSCOPY WITH ENDOBRONCHIAL NAVIGATION N/A 11/29/2015   Procedure: VIDEO BRONCHOSCOPY WITH  ENDOBRONCHIAL NAVIGATION;  Surgeon: RCollene Gobble MD;  [facility]  Service: Thoracic;  Laterality: N/A;    Allergies  Allergen Reactions   Vioxx [Rofecoxib] Shortness Of Breath   Dyazide [Hydrochlorothiazide W-Triamterene] Other (See Comments)    Lowers blood pressure too much   Latex Swelling and Other (See Comments)    "Allergic," per [facility]   Sulfa Antibiotics Nausea And Vomiting and Other (See Comments)    "Allergic," per [facility]  Sumycin [Tetracycline] Other (See Comments)    Can't take due to drug reaction- [facility]   Allergies as of 03/11/2022       Reactions   Vioxx [rofecoxib] Shortness Of Breath   Dyazide [hydrochlorothiazide W-triamterene] Other (See Comments)  Lowers blood pressure too much   Latex Swelling, Other (See Comments)   "Allergic," per [facility]   Sulfa Antibiotics Nausea And Vomiting, Other (See Comments)   "Allergic," per [facility]   Sumycin [tetracycline] Other (See Comments)   Can't take due to drug reaction- "Allergic," per [facility]        Medication List        Accurate as of March 11, 2022 11:59 PM. If you have any questions, ask your nurse or doctor.          acetaminophen 325 MG tablet Commonly known as: TYLENOL Take 650 mg by mouth every 4 (four) hours as needed for fever or mild pain.   carvedilol 3.125 MG tablet Commonly known as: COREG Take 1 tablet (3.125 mg total) by mouth 2 (two) times daily with a meal.   fluticasone 27.5 MCG/SPRAY nasal spray Commonly known as: VERAMYST Place 1 spray into the nose daily.   hydrocortisone cream 1 % Apply 1 application  topically every 12 (twelve) hours as needed for itching (dry skin related to psoriasis).   hydrocortisone 2.5 % lotion Apply 1 application  topically 2 (two) times daily as needed (face and ears as need for psoriasis).   ketoconazole 2 % shampoo Commonly known as: NIZORAL Apply 1 application  topically See admin instructions. Apply topically to scalp on Mondays and  Thursdays for hair care   ketoconazole 2 % cream Commonly known as: NIZORAL Apply 1 application  topically daily as needed (psoriasis).   nystatin powder Commonly known as: MYCOSTATIN/NYSTOP Apply 1 application  topically every 12 (twelve) hours as needed (urogenital area).   ondansetron 4 MG tablet Commonly known as: ZOFRAN Take 1 tablet (4 mg total) by mouth every 6 (six) hours as needed for nausea.   oxybutynin 5 MG tablet Commonly known as: DITROPAN Take 1 tablet (5 mg total) by mouth 3 (three) times daily.   pramipexole 0.5 MG tablet Commonly known as: MIRAPEX Take 0.5 mg by mouth at bedtime.   ProAir RespiClick 657 (90 Base) MCG/ACT Aepb Generic drug: Albuterol Sulfate Inhale 2 puffs into the lungs daily as needed (shortness of breath or wheezing).   albuterol 1.25 MG/3ML nebulizer solution Commonly known as: ACCUNEB Take 1 ampule by nebulization every 6 (six) hours as needed for shortness of breath.   traMADol 50 MG tablet Commonly known as: Ultram Take 1 tablet (50 mg total) by mouth every 8 (eight) hours as needed. For post-op pain   triamcinolone cream 0.1 % Commonly known as: KENALOG Apply 1 application. topically daily as needed (psoriasis).        Review of Systems  Immunization History  Administered Date(s) Administered   Influenza Split 01/06/2014, 01/15/2017, 01/08/2018, 12/09/2018   Influenza Whole 01/07/2012, 01/06/2013   Influenza, High Dose Seasonal PF 12/25/2015, 01/20/2017   Influenza,inj,Quad PF,6+ Mos 12/21/2014   Influenza-Unspecified 12/09/2018, 01/18/2020, 01/24/2021, 01/30/2022   Moderna SARS-COV2 Booster Vaccination 04/02/2021   Moderna Sars-Covid-2 Vaccination 04/12/2019, 06/05/2019, 02/15/2020, 09/05/2020   PFIZER(Purple Top)SARS-COV-2 Vaccination 12/26/2020   PPD Test 02/16/2022   Pneumococcal Conjugate-13 02/01/2014   Pneumococcal Polysaccharide-23 12/20/1992, 01/15/2000, 04/08/2004, 06/08/2004   Td 04/08/2002, 04/22/2002    Tdap 04/09/2011, 08/17/2015   Zoster Recombinat (Shingrix) 05/29/2005, 08/27/2017   Zoster, Live 04/08/2008, 05/29/2014, 05/28/2017   Zoster, Unspecified 05/29/2005   Pertinent  Health Maintenance Due  Topic Date Due   INFLUENZA VACCINE  Completed   DEXA SCAN  Completed   MAMMOGRAM  Discontinued      02/14/2022  7:47 PM 02/15/2022    3:00 PM 02/15/2022    9:00 PM 02/16/2022    8:00 AM 03/06/2022    7:25 AM  Fall Risk  Patient Fall Risk Level Low fall risk Low fall risk Moderate fall risk Moderate fall risk High fall risk   Functional Status Survey:    Vitals:   03/11/22 1515  BP: 130/78  Pulse: 76  Resp: 16  Temp: (!) 96.8 F (36 C)  SpO2: 96%  Weight: 138 lb 14.4 oz (63 kg)   Body mass index is 24.61 kg/m. Physical Exam  Labs reviewed: Recent Labs    09/02/21 0359 09/04/21 1308 02/13/22 0543 02/14/22 0425 02/15/22 0427 02/16/22 0904  NA 138   < > 141 142 141 139  K 3.2*   < > 3.8 3.1* 3.2* 3.4*  CL 104   < > 112* 117* 115* 111  CO2 27   < > 22 18* 21* 24  GLUCOSE 98   < > 97 87 100* 100*  BUN 21   < > 36* 34* 21 15  CREATININE 0.78   < > 0.87 0.70 0.52 0.60  CALCIUM 8.5*   < > 8.3* 7.9* 8.2* 8.2*  MG 2.2  --  2.3 2.1  --   --    < > = values in this interval not displayed.   Recent Labs    11/02/21 0000 11/04/21 1047 12/11/21 0000  AST 19 20 12*  ALT '11 12 8  '$ ALKPHOS 160* 157* 137*  BILITOT  --  0.5  --   PROT  --  7.0  --   ALBUMIN 3.7 3.6 3.0*   Recent Labs    11/02/21 0000 11/04/21 1047 12/11/21 0000 02/12/22 1831 02/14/22 0425 02/15/22 0427 02/16/22 0904  WBC 4.3 4.7 4.0   < > 11.7* 9.3 9.7  NEUTROABS 2,275.00 3.2 2,352.00  --   --   --   --   HGB 11.6* 12.9 11.7*   < > 9.9* 10.4* 10.9*  HCT 36 41.6 37   < > 32.6* 34.2* 37.0  MCV  --  83.7  --    < > 85.1 84.7 86.9  PLT 243 252 179   < > 218 231 282   < > = values in this interval not displayed.   Lab Results  Component Value Date   TSH 1.198 02/15/2022   No results  found for: "HGBA1C" Lab Results  Component Value Date   CHOL 147 09/27/2021   HDL 48 09/27/2021   LDLCALC 78 09/27/2021   TRIG 131 09/27/2021   CHOLHDL 3.6 06/27/2015    Significant Diagnostic Results in last 30 days:  DG C-Arm 1-60 Min-No Report  Result Date: 03/06/2022 Fluoroscopy was utilized by the requesting physician.  No radiographic interpretation.   DG C-Arm 1-60 Min-No Report  Result Date: 03/06/2022 Fluoroscopy was utilized by the requesting physician.  No radiographic interpretation.    Assessment/Plan: Ureteral stone with hydronephrosis 03/06/22 cystoscopy, R retrogradepyelogram, R ureteroscopy, lase lithotripsy, R stent exchange. Cephalexin 3 days, Tramadol for pain.   Hypokalemia K 3.3 02/21/22,  repeat serum K level.  Suprapubic Catheter, f/u Urology, on Ditropan  Anemia, chronic disease Hgb 10.2 02/21/22  SVT (supraventricular tachycardia) Heart rate is in control, takes Coreg  Edema not apparent, off Furosemide.   Adult failure to thrive under Hospice service.   Restless legs syndrome (RLS)   Restless leg syndrome, stable, taking MiraPex.   COPD (chronic obstructive pulmonary disease) (HCC) Stable,  on Atrovent, Xopenx  Lower obstructive uropathy Chronic SPC per urology.     Family/ staff Communication: plan of care reviewed with the patient and charge nurse.   Labs/tests ordered: pending serum K level  Time spend 35 minutes.

## 2022-03-12 NOTE — Assessment & Plan Note (Signed)
under Hospice service.

## 2022-03-12 NOTE — Assessment & Plan Note (Signed)
Restless leg syndrome, stable, taking MiraPex.

## 2022-03-12 NOTE — Assessment & Plan Note (Signed)
03/06/22 cystoscopy, R retrogradepyelogram, R ureteroscopy, lase lithotripsy, R stent exchange. Cephalexin 3 days, Tramadol for pain.

## 2022-03-12 NOTE — Assessment & Plan Note (Signed)
not apparent, off Furosemide.

## 2022-03-12 NOTE — Assessment & Plan Note (Signed)
Stable, on Atrovent, Xopenx

## 2022-03-12 NOTE — Assessment & Plan Note (Signed)
Hgb 10.2 02/21/22

## 2022-03-12 NOTE — Assessment & Plan Note (Addendum)
K 3.3 02/21/22,  repeat serum K level.  Suprapubic Catheter, f/u Urology, on Ditropan

## 2022-03-19 ENCOUNTER — Non-Acute Institutional Stay (SKILLED_NURSING_FACILITY): Payer: Medicare PPO | Admitting: Family Medicine

## 2022-03-19 DIAGNOSIS — F0390 Unspecified dementia without behavioral disturbance: Secondary | ICD-10-CM | POA: Diagnosis not present

## 2022-03-19 DIAGNOSIS — D638 Anemia in other chronic diseases classified elsewhere: Secondary | ICD-10-CM

## 2022-03-19 DIAGNOSIS — J431 Panlobular emphysema: Secondary | ICD-10-CM

## 2022-03-19 DIAGNOSIS — F323 Major depressive disorder, single episode, severe with psychotic features: Secondary | ICD-10-CM | POA: Diagnosis not present

## 2022-03-19 NOTE — Addendum Note (Signed)
Addended by: Wardell Honour on: 03/19/2022 12:06 PM   Modules accepted: Level of Service

## 2022-03-19 NOTE — Progress Notes (Signed)
Provider:  Alain Honey, MD Location:      Place of Service:     PCP: Mast, Man X, NP Patient Care Team: Mast, Man X, NP as PCP - General (Internal Medicine) [name], MD as Consulting Physician (Ophthalmology) [name], MD as Consulting Physician (Cardiology) [name], MD as Consulting Physician (Dermatology) [name], MD as Consulting Physician (Orthopedic Surgery) [name], MD as Consulting Physician (Orthopedic Surgery) [name], MD as Consulting Physician (Orthopedic Surgery) [place], [place], Man X, NP as Nurse Practitioner (Nurse Practitioner) [name], MD (Inactive) as Consulting Physician (Neurology)  Extended Emergency Contact Information Primary Emergency Contact: [name],[name] Address: [address]          [place], [place] [address] [name] of [place] Phone: [phone] Relation: Sister Secondary Emergency Contact: [name] [place] of [place] Phone: [phone] Mobile Phone: [phone] Relation: Sister  Code Status:  Goals of Care: Advanced Directive information    03/05/2022   11:32 AM  Advanced Directives  Does Patient Have a Medical Advance Directive? Yes  Type of Paramedic of [place];Living will  Copy of [place] in Chart? No - copy requested      No chief complaint on file.   HPI: Patient is a 88 y.o. female seen today for medical management of chronic problems including adult failure to thrive (on hospice), ureteral stone with hydronephrosis, supraventricular tachycardia, dementia without behavioral disturbance and depression.  And he recently had procedure done, cystoscopy with retrograde pyelogram and ureteroscopy and stent replacement.  She seems to be doing well from that problem. I see her around the facility conversing with staff and residents frequently.  She has lots of visitors.  She is on  hospice for failure to thrive.  States her appetite is not good. She also has a diagnosis of dementia and I have to question that.  We had a nice conversation today in which she was totally in the moment and spoke intelligently.  I believe she does have some depression but that too is doing well.  Past Medical History:  Diagnosis Date   Acute bronchitis 05/23/2011   Acute upper respiratory infections of unspecified site 05/23/2011   Arthritis    Chronic airway obstruction, not elsewhere classified 05/23/2011   Coronary artery disease    Disturbance of salivary secretion 01/31/2011   Dizziness and giddiness 01/31/2011   Dyspnea    Essential tremor 04/25/2014   External hemorrhoids without mention of complication 61/60/7371   Gait disorder 04/25/2014   GERD (gastroesophageal reflux disease)    History of kidney stones    Insomnia, unspecified 09/12/2011   Lumbago 01/31/2011   Major depressive disorder, single episode, unspecified 01/31/2011   Memory disorder 04/25/2014   Mitral valve disorders(424.0) 01/31/2011   Other and unspecified hyperlipidemia 01/31/2011   Other convulsions 01/31/2011   Other emphysema ([place]) 01/31/2011   Pain in joint, site unspecified 01/31/2011   Restless legs syndrome (RLS) 09/12/2011   Retinal detachment with retinal defect of right eye 2011   right eye twice   Seizure disorder ([place])    Senile osteoporosis 01/31/2011   Spontaneous ecchymoses 01/31/2011   Squamous cell carcinoma of leg    Stiffness of joints, not elsewhere classified, multiple sites 01/31/2011   Unspecified essential hypertension 01/31/2011   Past Surgical History:  Procedure Laterality Date   ABDOMINAL HYSTERECTOMY  06/21/2003   TAH/BSO, omenectomy  PSB resect, Stg IC cystadenofibroma   CHOLECYSTECTOMY  2005   Dr. Marlou Starks   CYSTOSCOPY W/ URETERAL STENT PLACEMENT Right 02/13/2022   Procedure: CYSTOSCOPY WITH RETROGRADE PYELOGRAM/URETERAL STENT PLACEMENT, AND SUPRAPUBIC TUBE EXCHANGE;   Surgeon: Alexis Frock, MD;  Location: [facility];  Service: Urology;  Laterality: Right;   CYSTOSCOPY WITH RETROGRADE PYELOGRAM, URETEROSCOPY AND STENT PLACEMENT Right 03/06/2022   Procedure: CYSTOSCOPY WITH RETROGRADE PYELOGRAM, URETEROSCOPY AND STENT REPLACEMENT, SUPRAPUBIC CATHETER EXCHANGE;  Surgeon: Alexis Frock, MD;  Location: [facility];  Service: Urology;  Laterality: Right;   ELBOW SURGERY Right 2008   broken   Dr. Apolonio Schneiders   EYE SURGERY     HOLMIUM LASER APPLICATION Right 32/44/0102   Procedure: HOLMIUM LASER APPLICATION;  Surgeon: Alexis Frock, MD;  Location: [facility];  Service: Urology;  Laterality: Right;   INTRAMEDULLARY (IM) NAIL INTERTROCHANTERIC Left 08/30/2021   Procedure: INTRAMEDULLARY (IM) NAIL INTERTROCHANTRIC;  Surgeon: Rod Can, MD;  Location: [facility];  Service: Orthopedics;  Laterality: Left;   ORIF PATELLA Left 05/02/2020   Procedure: OPEN REDUCTION INTERNAL (ORIF) FIXATION LEFT PATELLA WITH MEDIAL AND LATERAL LIGAMENT REINFORCEMENTS;  Surgeon: [name], MD;  Location: [facility];  Service: Orthopedics;  Laterality: Left;   ORIF PATELLA Left 05/30/2020   Procedure: OPEN REDUCTION INTERNAL (ORIF) FIXATION PATELLA;  Surgeon: [name], MD;  Location: [facility];  Service: Orthopedics;  Laterality: Left;   RETINAL DETACHMENT SURGERY N/A    two   REVERSE SHOULDER ARTHROPLASTY Left 05/06/2019   Procedure: REVERSE SHOULDER ARTHROPLASTY;  Surgeon: Justice Britain, MD;  Location: [facility];  Service: Orthopedics;  Laterality: Left;  116mn   ROTATOR CUFF REPAIR Right 2012   Dr. CTheda Sers  SQUAMOUS CELL CARCINOMA EXCISION Bilateral 2012, 8/14   [place] on legs   Dr. GSarajane Jews  TONSILLECTOMY  1941   VIDEO BRONCHOSCOPY WITH ENDOBRONCHIAL NAVIGATION N/A 11/29/2015   Procedure: VIDEO BRONCHOSCOPY WITH ENDOBRONCHIAL NAVIGATION;  Surgeon: RCollene Gobble MD;  [facility]  Service: Thoracic;  Laterality: N/A;    reports that she quit smoking about 33 years ago. Her smoking use  included cigarettes. She has a 40.00 pack-year smoking history. She has never used smokeless tobacco. She reports that she does not drink alcohol and does not use drugs. 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  Tobacco Use   Smoking status: Former    Packs/day: 1.00    Years: 40.00    Total pack years: 40.00    Types: Cigarettes    Quit date: 04/08/1988    Years since quitting: 33.9   Smokeless tobacco: Never   Tobacco comments:    [place]   Vaping Use: Never used  Substance and Sexual Activity   Alcohol use: No    Alcohol/week: 0.0 standard drinks of alcohol   Drug use: No   Sexual activity: Never  Other Topics Concern   Not on file  Social History Narrative   [facility]  [place] [facility]   Divorced   Exercise climbs steps 4 flights daily PT for balance   Alcohol none   Stopped smoking 1989   Patient drinks 3 large sodas daily.   Patient is right handed.  Social Determinants of Health   Financial Resource Strain: Not on file  Food Insecurity: No Food Insecurity (02/12/2022)   Hunger Vital Sign    Worried About Running Out of Food in the Last Year: Never true    Ran Out of Food in the Last Year: Never true  Transportation Needs: No Transportation Needs (02/12/2022)   PRAPARE - Hydrologist (Medical): No    Lack of Transportation (Non-Medical): No  Physical Activity: Not on file  Stress: Not on file  Social Connections: Not on file  Intimate Partner Violence: Not At Risk (02/12/2022)   Humiliation, Afraid, Rape, and Kick questionnaire    Fear of Current or Ex-Partner: No    Emotionally Abused: No    Physically Abused: No    Sexually Abused: No    Functional Status Survey:     Family History  Problem Relation Age of Onset   Heart disease Father        CHF   Cancer Mother        breast   Seizures Sister     Health Maintenance  Topic Date Due   INFLUENZA VACCINE  11/06/2021   DTaP/Tdap/Td (5 - Td or Tdap) 08/16/2025   Pneumonia Vaccine 80+ Years old  Completed   DEXA SCAN  Completed   Zoster Vaccines- Shingrix  Completed   HPV VACCINES  Aged Out   MAMMOGRAM  Discontinued   COVID-19 Vaccine  Discontinued    Allergies  Allergen Reactions   Vioxx [Rofecoxib] Shortness Of Breath   Dyazide [Hydrochlorothiazide W-Triamterene] Other (See Comments)    Lowers blood pressure too much   Latex Swelling and Other (See Comments)    "Allergic," per [facility]   Sulfa Antibiotics Nausea And Vomiting and Other (See Comments)    "Allergic," per [facility]   Sumycin [Tetracycline] Other (See Comments)    Can't take due to drug reaction- "Allergic," per [facility]    Outpatient Encounter Medications as of 03/19/2022  Medication Sig   acetaminophen (TYLENOL) 325 MG tablet Take 650 mg by mouth every 4 (four) hours as needed for fever or mild pain.   albuterol (ACCUNEB) 1.25 MG/3ML nebulizer solution Take 1 ampule by nebulization every 6 (six) hours as needed for shortness of breath.   Albuterol Sulfate (PROAIR RESPICLICK) 166 (90 Base) MCG/ACT AEPB Inhale 2 puffs into the lungs daily as needed (shortness of breath or wheezing).   carvedilol (COREG) 3.125 MG tablet Take 1 tablet (3.125 mg total) by mouth 2 (two) times daily with a meal.   fluticasone (VERAMYST) 27.5 MCG/SPRAY nasal spray Place 1 spray into the nose daily.   hydrocortisone 2.5 % lotion Apply 1 application  topically 2 (two) times daily as needed (face and ears as need for psoriasis).   hydrocortisone cream 1 % Apply 1 application  topically every 12 (twelve) hours as needed for itching (dry skin related to psoriasis).   ipratropium (ATROVENT) 0.02 % nebulizer solution Take 2.5 mLs (0.5 mg total) by nebulization 3 (three)  times daily for 3 days, THEN 2.5 mLs (0.5 mg total) every 6 (six) hours as needed for up to 3 days. (Patient not taking: Reported on 02/27/2022)   ketoconazole (NIZORAL) 2 % cream Apply 1 application  topically daily as needed (psoriasis).   ketoconazole (NIZORAL) 2 % shampoo Apply 1 application  topically See admin instructions. Apply topically to scalp on Mondays and Thursdays for hair care   levalbuterol (XOPENEX) 0.63 MG/3ML nebulizer solution Take 3 mLs (  0.63 mg total) by nebulization 3 (three) times daily for 3 days, THEN 3 mLs (0.63 mg total) every 6 (six) hours as needed for up to 3 days. (Patient not taking: Reported on 02/27/2022)   nystatin (MYCOSTATIN/NYSTOP) powder Apply 1 application  topically every 12 (twelve) hours as needed (urogenital area).   ondansetron (ZOFRAN) 4 MG tablet Take 1 tablet (4 mg total) by mouth every 6 (six) hours as needed for nausea.   oxybutynin (DITROPAN) 5 MG tablet Take 1 tablet (5 mg total) by mouth 3 (three) times daily.   pramipexole (MIRAPEX) 0.5 MG tablet Take 0.5 mg by mouth at bedtime.   traMADol (ULTRAM) 50 MG tablet Take 1 tablet (50 mg total) by mouth every 8 (eight) hours as needed. For post-op pain   triamcinolone (KENALOG) 0.1 % Apply 1 application. topically daily as needed (psoriasis).   No facility-administered encounter medications on file as of 03/19/2022.    Review of Systems  Constitutional: Negative.   Respiratory: Negative.    Cardiovascular: Negative.   Genitourinary:  Positive for dysuria.  Neurological: Negative.   Psychiatric/Behavioral: Negative.    All other systems reviewed and are negative.   There were no vitals filed for this visit. There is no height or weight on file to calculate BMI. Physical Exam Vitals and nursing note reviewed.  Constitutional:      Appearance: Normal appearance.  Eyes:     Extraocular Movements: Extraocular movements intact.     Pupils: Pupils are equal, round, and reactive to light.   Cardiovascular:     Rate and Rhythm: Normal rate and regular rhythm.  Pulmonary:     Effort: Pulmonary effort is normal.     Breath sounds: Normal breath sounds.  Skin:    General: Skin is dry.  Neurological:     General: No focal deficit present.     Mental Status: She is alert and oriented to person, place, and time.  Psychiatric:        Mood and Affect: Mood normal.        Behavior: Behavior normal.     Labs reviewed: Basic Metabolic Panel: Recent Labs    09/02/21 0359 09/04/21 1308 02/13/22 0543 02/14/22 0425 02/15/22 0427 02/16/22 0904  NA 138   < > 141 142 141 139  K 3.2*   < > 3.8 3.1* 3.2* 3.4*  CL 104   < > 112* 117* 115* 111  CO2 27   < > 22 18* 21* 24  GLUCOSE 98   < > 97 87 100* 100*  BUN 21   < > 36* 34* 21 15  CREATININE 0.78   < > 0.87 0.70 0.52 0.60  CALCIUM 8.5*   < > 8.3* 7.9* 8.2* 8.2*  MG 2.2  --  2.3 2.1  --   --    < > = values in this interval not displayed.   Liver Function Tests: Recent Labs    11/02/21 0000 11/04/21 1047 12/11/21 0000  AST 19 20 12*  ALT '11 12 8  '$ ALKPHOS 160* 157* 137*  BILITOT  --  0.5  --   PROT  --  7.0  --   ALBUMIN 3.7 3.6 3.0*   No results for input(s): "LIPASE", "AMYLASE" in the last 8760 hours. No results for input(s): "AMMONIA" in the last 8760 hours. CBC: Recent Labs    11/02/21 0000 11/04/21 1047 12/11/21 0000 02/12/22 1831 02/14/22 0425 02/15/22 0427 02/16/22 0904  WBC 4.3 4.7 4.0   < >  11.7* 9.3 9.7  NEUTROABS 2,275.00 3.2 2,352.00  --   --   --   --   HGB 11.6* 12.9 11.7*   < > 9.9* 10.4* 10.9*  HCT 36 41.6 37   < > 32.6* 34.2* 37.0  MCV  --  83.7  --    < > 85.1 84.7 86.9  PLT 243 252 179   < > 218 231 282   < > = values in this interval not displayed.   Cardiac Enzymes: No results for input(s): "CKTOTAL", "CKMB", "CKMBINDEX", "TROPONINI" in the last 8760 hours. BNP: Invalid input(s): "POCBNP" No results found for: "HGBA1C" Lab Results  Component Value Date   TSH 1.198 02/15/2022    Lab Results  Component Value Date   VITAMINB12 249 09/05/2021   Lab Results  Component Value Date   FOLATE 23.1 09/05/2021   Lab Results  Component Value Date   IRON 64 09/05/2021   TIBC 369 09/05/2021   FERRITIN 31 09/05/2021    Imaging and Procedures obtained prior to SNF admission: DG C-Arm 1-60 Min-No Report  Result Date: 03/06/2022 Fluoroscopy was utilized by the requesting physician.  No radiographic interpretation.   DG C-Arm 1-60 Min-No Report  Result Date: 03/06/2022 Fluoroscopy was utilized by the requesting physician.  No radiographic interpretation.    Assessment/Plan 1. Dementia without behavioral disturbance ([place]) Again question this diagnosis.  I believe she may have had some issues related to her depression which mimicked dementia  2. Panlobular emphysema ([facility]) Using inhalers.  There are no complaints of shortness of breath.  Meds include Xopenex and as needed albuterol    3. Depression, psychotic ([place])  No current medications 4. Anemia, chronic disease Most recent hemoglobin 9.9 but iron studies were normal   Family/ staff Communication:   Labs/tests ordered:  Lillette Boxer. Sabra Heck, [place] [address] [place], [place] Office [phone]

## 2022-03-28 DIAGNOSIS — N139 Obstructive and reflux uropathy, unspecified: Secondary | ICD-10-CM | POA: Insufficient documentation

## 2022-03-28 NOTE — Assessment & Plan Note (Signed)
Chronic SPC per urology.

## 2022-04-23 ENCOUNTER — Non-Acute Institutional Stay (SKILLED_NURSING_FACILITY): Payer: Medicare PPO | Admitting: Nurse Practitioner

## 2022-04-23 ENCOUNTER — Encounter: Payer: Self-pay | Admitting: Nurse Practitioner

## 2022-04-23 DIAGNOSIS — R609 Edema, unspecified: Secondary | ICD-10-CM | POA: Diagnosis not present

## 2022-04-23 DIAGNOSIS — E876 Hypokalemia: Secondary | ICD-10-CM

## 2022-04-23 DIAGNOSIS — D638 Anemia in other chronic diseases classified elsewhere: Secondary | ICD-10-CM | POA: Diagnosis not present

## 2022-04-23 DIAGNOSIS — R627 Adult failure to thrive: Secondary | ICD-10-CM

## 2022-04-23 DIAGNOSIS — G2581 Restless legs syndrome: Secondary | ICD-10-CM

## 2022-04-23 DIAGNOSIS — R569 Unspecified convulsions: Secondary | ICD-10-CM

## 2022-04-23 DIAGNOSIS — Z9359 Other cystostomy status: Secondary | ICD-10-CM

## 2022-04-23 NOTE — Assessment & Plan Note (Signed)
Hx of seizures, no active seizures, off meds.

## 2022-04-23 NOTE — Assessment & Plan Note (Signed)
K 3.3 02/21/22

## 2022-04-23 NOTE — Assessment & Plan Note (Signed)
f/u Urology, on Ditropan, 02/12/22 Obstructive nephritis, underwent cystoscopy with right retrograde pyelogram, right ureteral stent placed, f/u Urology Dr. Tresa Moore, 03/06/22 cystoscopy with retrograde pyelogram, ureteroscopy and stent replacement, SPC exchange.

## 2022-04-23 NOTE — Assessment & Plan Note (Signed)
not apparent, off Furosemide.

## 2022-04-23 NOTE — Assessment & Plan Note (Signed)
Restless leg syndrome, stable, taking MiraPex.

## 2022-04-23 NOTE — Assessment & Plan Note (Signed)
Hgb 10.3 02/21/22

## 2022-04-23 NOTE — Assessment & Plan Note (Signed)
on Atrovent, Xopenx 

## 2022-04-23 NOTE — Assessment & Plan Note (Signed)
under Hospice service.

## 2022-04-23 NOTE — Progress Notes (Signed)
Location:  [place] Room Number: 29-A Place of Service:  SNF (31) Provider:  ManXie Adleigh Mcmasters,NP  Odell Fasching X, NP  Patient Care Team: Nathaniel Yaden X, NP as PCP - General (Internal Medicine) [name], MD as Consulting Physician (Ophthalmology) [name], MD as Consulting Physician (Cardiology) [name], MD as Consulting Physician (Dermatology) [name], MD as Consulting Physician (Orthopedic Surgery) [name], MD as Consulting Physician (Orthopedic Surgery) [name], MD as Consulting Physician (Orthopedic Surgery) [place], [place], Cruze Zingaro X, NP as Nurse Practitioner (Nurse Practitioner) [name], MD (Inactive) as Consulting Physician (Neurology)  Extended Emergency Contact Information Primary Emergency Contact: [name],[name] Address: [address]          [place], [place] [address] [name] of [place] Phone: [phone] Relation: Sister Secondary Emergency Contact: [name] [place] of [place] Phone: [phone] Mobile Phone: [phone] Relation: Sister  Code Status:  DNR Goals of care: Advanced Directive information    04/23/2022    2:15 PM  Advanced Directives  Does Patient Have a Medical Advance Directive? Yes  Type of Advance Directive Out of facility DNR (pink MOST or yellow form)  Does patient want to make changes to medical advance directive? No - Patient declined  Pre-existing out of facility DNR order (yellow form or pink MOST form) Pink MOST/Yellow Form most recent copy in chart - Physician notified to receive inpatient order     Chief Complaint  Patient presents with   Routine    HPI:  Pt is a 89 y.o. female seen today for medical management of chronic diseases.          02/12/22 Obstructive nephritis, underwent cystoscopy with right retrograde pyelogram, right ureteral stent placed, f/u Urology Dr. Tresa Moore, 03/06/22 cystoscopy with  retrograde pyelogram, ureteroscopy and stent replacement, SPC exchange.              Ureteral stone with hydronephrosis per CT abd             Hypokalemia, K 3.3 02/21/22             Anemia, Hgb 10.3 02/21/22             SVT, takes Coreg             BLE edema, not apparent, off Furosemide.              Adult failure to thrive, under Hospice service.              Suprapubic Catheter, f/u Urology, on Ditropan             Depression, stable             Dementia, under hospice service             Restless leg syndrome, stable, taking MiraPex.              Hx of seizures, no active seizures, off meds.              COPD, on Atrovent, Xopenx    Past Medical History:  Diagnosis Date   Acute bronchitis 05/23/2011   Acute upper respiratory infections of unspecified site 05/23/2011   Arthritis    Chronic airway obstruction, not elsewhere classified 05/23/2011   Coronary artery disease    Disturbance of salivary secretion 01/31/2011   Dizziness and giddiness 01/31/2011   Dyspnea  Essential tremor 04/25/2014   External hemorrhoids without mention of complication 14/97/0263   Gait disorder 04/25/2014   GERD (gastroesophageal reflux disease)    History of kidney stones    Insomnia, unspecified 09/12/2011   Lumbago 01/31/2011   Major depressive disorder, single episode, unspecified 01/31/2011   Memory disorder 04/25/2014   Mitral valve disorders(424.0) 01/31/2011   Other and unspecified hyperlipidemia 01/31/2011   Other convulsions 01/31/2011   Other emphysema ([place]) 01/31/2011   Pain in joint, site unspecified 01/31/2011   Restless legs syndrome (RLS) 09/12/2011   Retinal detachment with retinal defect of right eye 2011   right eye twice   Seizure disorder ([place])    Senile osteoporosis 01/31/2011   Spontaneous ecchymoses 01/31/2011   Squamous cell carcinoma of leg    Stiffness of joints, not elsewhere classified, multiple sites 01/31/2011   Unspecified essential hypertension  01/31/2011   Past Surgical History:  Procedure Laterality Date   ABDOMINAL HYSTERECTOMY  06/21/2003   TAH/BSO, omenectomy PSB resect, Stg IC cystadenofibroma   CHOLECYSTECTOMY  2005   Dr. Marlou Starks   CYSTOSCOPY W/ URETERAL STENT PLACEMENT Right 02/13/2022   Procedure: CYSTOSCOPY WITH RETROGRADE PYELOGRAM/URETERAL STENT PLACEMENT, AND SUPRAPUBIC TUBE EXCHANGE;  Surgeon: Alexis Frock, MD;  Location: [facility];  Service: Urology;  Laterality: Right;   CYSTOSCOPY WITH RETROGRADE PYELOGRAM, URETEROSCOPY AND STENT PLACEMENT Right 03/06/2022   Procedure: CYSTOSCOPY WITH RETROGRADE PYELOGRAM, URETEROSCOPY AND STENT REPLACEMENT, SUPRAPUBIC CATHETER EXCHANGE;  Surgeon: Alexis Frock, MD;  Location: [facility];  Service: Urology;  Laterality: Right;   ELBOW SURGERY Right 2008   broken   Dr. Apolonio Schneiders   EYE SURGERY     HOLMIUM LASER APPLICATION Right 78/58/8502   Procedure: HOLMIUM LASER APPLICATION;  Surgeon: Alexis Frock, MD;  Location: [facility];  Service: Urology;  Laterality: Right;   INTRAMEDULLARY (IM) NAIL INTERTROCHANTERIC Left 08/30/2021   Procedure: INTRAMEDULLARY (IM) NAIL INTERTROCHANTRIC;  Surgeon: Rod Can, MD;  Location: [facility];  Service: Orthopedics;  Laterality: Left;   ORIF PATELLA Left 05/02/2020   Procedure: OPEN REDUCTION INTERNAL (ORIF) FIXATION LEFT PATELLA WITH MEDIAL AND LATERAL LIGAMENT REINFORCEMENTS;  Surgeon: [name], MD;  Location: [facility];  Service: Orthopedics;  Laterality: Left;   ORIF PATELLA Left 05/30/2020   Procedure: OPEN REDUCTION INTERNAL (ORIF) FIXATION PATELLA;  Surgeon: [name], MD;  Location: [facility];  Service: Orthopedics;  Laterality: Left;   RETINAL DETACHMENT SURGERY N/A    two   REVERSE SHOULDER ARTHROPLASTY Left 05/06/2019   Procedure: REVERSE SHOULDER ARTHROPLASTY;  Surgeon: Justice Britain, MD;  Location: [facility];  Service: Orthopedics;  Laterality: Left;  146mn   ROTATOR CUFF REPAIR Right 2012   Dr. CTheda Sers  SQUAMOUS CELL CARCINOMA  EXCISION Bilateral 2012, 8/14   [place] on legs   Dr. GSarajane Jews  TONSILLECTOMY  1941   VIDEO BRONCHOSCOPY WITH ENDOBRONCHIAL NAVIGATION N/A 11/29/2015   Procedure: VIDEO BRONCHOSCOPY WITH ENDOBRONCHIAL NAVIGATION;  Surgeon: RCollene Gobble MD;  [facility]: [facility]  Service: Thoracic;  Laterality: N/A;    Allergies  Allergen Reactions   Vioxx [Rofecoxib] Shortness Of Breath   Dyazide [Hydrochlorothiazide W-Triamterene] Other (See Comments)    Lowers blood pressure too much   Latex Swelling and Other (See Comments)    "Allergic," per [facility]   Sulfa Antibiotics Nausea And Vomiting and Other (See Comments)    [facility]  Sumycin [Tetracycline] Other (See Comments)    Can't take due to drug reaction- [facility]   Outpatient  Encounter Medications as of 04/23/2022  Medication Sig   acetaminophen (TYLENOL) 325 MG tablet Take 650 mg by mouth every 4 (four) hours as needed for fever or mild pain. Give 2 tablet by mouth every 6 hours as needed for Pain   albuterol (ACCUNEB) 1.25 MG/3ML nebulizer solution Take 1 ampule by nebulization every 6 (six) hours as needed for shortness of breath.   Albuterol Sulfate (PROAIR RESPICLICK) 834 (90 Base) MCG/ACT AEPB Inhale 2 puffs into the lungs daily as needed (shortness of breath or wheezing).   carvedilol (COREG) 3.125 MG tablet Take 1 tablet (3.125 mg total) by mouth 2 (two) times daily with a meal.   fluticasone (VERAMYST) 27.5 MCG/SPRAY nasal spray Place 1 spray into the nose daily.   hydrocortisone 2.5 % lotion Apply 1 application  topically 2 (two) times daily as needed (face and ears as need for psoriasis).   hydrocortisone cream 1 % Apply 1 application  topically every 12 (twelve) hours as needed for itching (dry skin related to psoriasis).   ketoconazole (NIZORAL) 2 % cream Apply 1 application  topically daily as needed (psoriasis).   ketoconazole (NIZORAL) 2 % shampoo Apply 1 application  topically See admin instructions. Apply topically  to scalp on Mondays and Thursdays for hair care   nystatin (MYCOSTATIN/NYSTOP) powder Apply 1 application  topically every 12 (twelve) hours as needed (urogenital area).   ondansetron (ZOFRAN) 4 MG tablet Take 1 tablet (4 mg total) by mouth every 6 (six) hours as needed for nausea.   oxybutynin (DITROPAN) 5 MG tablet Take 1 tablet (5 mg total) by mouth 3 (three) times daily.   pramipexole (MIRAPEX) 0.5 MG tablet Take 0.5 mg by mouth at bedtime.   traMADol (ULTRAM) 50 MG tablet Take 1 tablet (50 mg total) by mouth every 8 (eight) hours as needed. For post-op pain   triamcinolone (KENALOG) 0.1 % Apply 1 application. topically daily as needed (psoriasis).   ipratropium (ATROVENT) 0.02 % nebulizer solution Take 2.5 mLs (0.5 mg total) by nebulization 3 (three) times daily for 3 days, THEN 2.5 mLs (0.5 mg total) every 6 (six) hours as needed for up to 3 days. (Patient not taking: Reported on 02/27/2022)   levalbuterol (XOPENEX) 0.63 MG/3ML nebulizer solution Take 3 mLs (0.63 mg total) by nebulization 3 (three) times daily for 3 days, THEN 3 mLs (0.63 mg total) every 6 (six) hours as needed for up to 3 days. (Patient not taking: Reported on 02/27/2022)   No facility-administered encounter medications on file as of 04/23/2022.    Review of Systems  Constitutional:  Negative for appetite change, fatigue and fever.  HENT:  Positive for hearing loss. Negative for congestion and trouble swallowing.   Eyes:  Negative for visual disturbance.  Respiratory:  Negative for cough.        DOE is chronic  Cardiovascular:  Positive for leg swelling.  Gastrointestinal:  Negative for abdominal pain and constipation.       Acid reflux symptoms.   Genitourinary:  Positive for difficulty urinating. Negative for hematuria.       [facility]  Musculoskeletal:  Positive for arthralgias and gait problem.       S/p left hip ORIF, ORIF of the left patella.   Skin:  Positive for wound. Negative for color change.       BLE  discoloration, chronic venous insufficiency skin changes   Neurological:  Negative for seizures, speech difficulty, weakness and headaches.       Memory lapses. Hx  of seizures. RLS  Psychiatric/Behavioral:  Negative for behavioral problems and sleep disturbance. The patient is not nervous/anxious.     Immunization History  Administered Date(s) Administered   Influenza Split 01/06/2014, 01/15/2017, 01/08/2018, 12/09/2018   Influenza Whole 01/07/2012, 01/06/2013   Influenza, High Dose Seasonal PF 12/25/2015, 01/20/2017   Influenza,inj,Quad PF,6+ Mos 12/21/2014   Influenza-Unspecified 12/09/2018, 01/18/2020, 01/24/2021, 01/30/2022   Moderna SARS-COV2 Booster Vaccination 04/02/2021   Moderna Sars-Covid-2 Vaccination 04/12/2019, 06/05/2019, 02/15/2020, 09/05/2020   PFIZER(Purple Top)SARS-COV-2 Vaccination 12/26/2020   PPD Test 02/16/2022   Pneumococcal Conjugate-13 02/01/2014   Pneumococcal Polysaccharide-23 12/20/1992, 01/15/2000, 04/08/2004, 06/08/2004   Td 04/08/2002, 04/22/2002   Tdap 04/09/2011, 08/17/2015   Zoster Recombinat (Shingrix) 05/29/2005, 08/27/2017   Zoster, Live 04/08/2008, 05/29/2014, 05/28/2017   Zoster, Unspecified 05/29/2005   Pertinent  Health Maintenance Due  Topic Date Due   INFLUENZA VACCINE  Completed   DEXA SCAN  Completed   MAMMOGRAM  Discontinued      02/14/2022    7:47 PM 02/15/2022    3:00 PM 02/15/2022    9:00 PM 02/16/2022    8:00 AM 03/06/2022    7:25 AM  Fall Risk  (RETIRED) Patient Fall Risk Level Low fall risk Low fall risk Moderate fall risk Moderate fall risk High fall risk   Functional Status Survey:    Vitals:   04/23/22 1404  BP: 124/76  Pulse: 76  Resp: 18  Temp: (!) 97.4 F (36.3 C)  SpO2: 95%  Weight: 143 lb 8 oz (65.1 kg)  Height: '5\' 3"'$  (1.6 m)   Body mass index is 25.42 kg/m. Physical Exam Vitals and nursing note reviewed.  Constitutional:      Appearance: Normal appearance.  HENT:     Head: Normocephalic and  atraumatic.     Nose: Nose normal.     Mouth/Throat:     Mouth: Mucous membranes are moist.  Eyes:     Extraocular Movements: Extraocular movements intact.     Conjunctiva/sclera: Conjunctivae normal.     Right eye: Right conjunctiva is not injected.     Left eye: Left conjunctiva is not injected.     Pupils: Pupils are equal, round, and reactive to light.  Neck:     Comments: Left adam's apple bony aspect, sometimes feels soreness on palpation is chronic, no noted lymph nodes  Cardiovascular:     Rate and Rhythm: Normal rate and regular rhythm.     Heart sounds: Murmur heard.     Comments: PD pulses are not felt from previous examination.  Pulmonary:     Effort: Pulmonary effort is normal.     Breath sounds: No rales.     Comments: Decreased air entry to both lungs.  Abdominal:     General: Bowel sounds are normal.     Palpations: Abdomen is soft.     Tenderness: There is no abdominal tenderness.     Comments: Mid abd surgical scar. SPC  Genitourinary:    Comments: SPC, tea colored urine Musculoskeletal:     Cervical back: Normal range of motion and neck supple.     Right lower leg: No edema.     Left lower leg: No edema.     Comments: Decreased overhead ROM of the left shoulder. Left knee s/p ORIF of the patella fx. Trace edema BLE  Skin:    General: Skin is warm and dry.     Comments: BLE discoloration, chronic venous insufficiency skin changes, lateral left lower leg slow healing previous ruptured hematoma.  A quarter sized red, warmth, raised, fluid filled area on the previous left knee surgical incision-resolved   Neurological:     General: No focal deficit present.     Mental Status: She is alert. Mental status is at baseline.     Gait: Gait abnormal.  Psychiatric:        Mood and Affect: Mood normal.        Behavior: Behavior normal.     Labs reviewed: Recent Labs    09/02/21 0359 09/04/21 1308 02/13/22 0543 02/14/22 0425 02/15/22 0427 02/16/22 0904  02/21/22 0000  NA 138   < > 141 142 141 139 142  K 3.2*   < > 3.8 3.1* 3.2* 3.4* 3.3*  CL 104   < > 112* 117* 115* 111 108  CO2 27   < > 22 18* 21* 24 27*  GLUCOSE 98   < > 97 87 100* 100*  --   BUN 21   < > 36* 34* '21 15 11  '$ CREATININE 0.78   < > 0.87 0.70 0.52 0.60 0.6  CALCIUM 8.5*   < > 8.3* 7.9* 8.2* 8.2* 8.1*  MG 2.2  --  2.3 2.1  --   --   --    < > = values in this interval not displayed.   Recent Labs    11/02/21 0000 11/04/21 1047 12/11/21 0000  AST 19 20 12*  ALT '11 12 8  '$ ALKPHOS 160* 157* 137*  BILITOT  --  0.5  --   PROT  --  7.0  --   ALBUMIN 3.7 3.6 3.0*   Recent Labs    11/04/21 1047 12/11/21 0000 02/12/22 1831 02/14/22 0425 02/15/22 0427 02/16/22 0904 02/21/22 0000  WBC 4.7 4.0   < > 11.7* 9.3 9.7 6.3  NEUTROABS 3.2 2,352.00  --   --   --   --  4,082.00  HGB 12.9 11.7*   < > 9.9* 10.4* 10.9* 10.2*  HCT 41.6 37   < > 32.6* 34.2* 37.0 32*  MCV 83.7  --    < > 85.1 84.7 86.9  --   PLT 252 179   < > 218 231 282 329   < > = values in this interval not displayed.   Lab Results  Component Value Date   TSH 1.198 02/15/2022   No results found for: "HGBA1C" Lab Results  Component Value Date   CHOL 147 09/27/2021   HDL 48 09/27/2021   LDLCALC 78 09/27/2021   TRIG 131 09/27/2021   CHOLHDL 3.6 06/27/2015    Significant Diagnostic Results in last 30 days:  No results found.  Assessment/Plan Suprapubic catheter [facility])  f/u Urology, on Ditropan, 02/12/22 Obstructive nephritis, underwent cystoscopy with right retrograde pyelogram, right ureteral stent placed, f/u Urology Dr. Tresa Moore, 03/06/22 cystoscopy with retrograde pyelogram, ureteroscopy and stent replacement, SPC exchange.   Hypokalemia K 3.3 02/21/22  Anemia, chronic disease Hgb 10.3 02/21/22  Edema  not apparent, off Furosemide.   Adult failure to thrive under Hospice service.   Restless legs syndrome (RLS)   Restless leg syndrome, stable, taking MiraPex.   Seizure ([place])  Hx of  seizures, no active seizures, off meds.   COPD (chronic obstructive pulmonary disease) ([place]) on Atrovent, Xopenx     Family/ staff Communication: plan of care reviewed with the patient and charge nurse.   Labs/tests ordered:  none  Time spend 25 minutes.

## 2022-04-30 ENCOUNTER — Non-Acute Institutional Stay (SKILLED_NURSING_FACILITY): Admitting: Family Medicine

## 2022-04-30 DIAGNOSIS — Z9359 Other cystostomy status: Secondary | ICD-10-CM

## 2022-04-30 NOTE — Progress Notes (Signed)
Provider:  Alain Honey, MD Location:      Place of Service:     PCP: Mast, Man X, NP Patient Care Team: Mast, Man X, NP as PCP - General (Internal Medicine) [name], MD as Consulting Physician (Ophthalmology) [name], MD as Consulting Physician (Cardiology) [name], MD as Consulting Physician (Dermatology) [name], MD as Consulting Physician (Orthopedic Surgery) [name], MD as Consulting Physician (Orthopedic Surgery) [name], MD as Consulting Physician (Orthopedic Surgery) [place], Bertie, Man X, NP as Nurse Practitioner (Nurse Practitioner) [name], MD (Inactive) as Consulting Physician (Neurology)  Extended Emergency Contact Information Primary Emergency Contact: [name],[name] Address: [address]          [place], [place] [address] [name] of [place] Phone: [phone] Relation: Sister Secondary Emergency Contact: [name] [place] of [place] Phone: [phone] Mobile Phone: [phone] Relation: Sister  Code Status:  Goals of Care: Advanced Directive information    04/23/2022    2:15 PM  Advanced Directives  Does Patient Have a Medical Advance Directive? Yes  Type of Advance Directive Out of facility DNR (pink MOST or yellow form)  Does patient want to make changes to medical advance directive? No - Patient declined  Pre-existing out of facility DNR order (yellow form or pink MOST form) Pink MOST/Yellow Form most recent copy in chart - Physician notified to receive inpatient order      No chief complaint on file.   HPI: Patient is a 89 y.o. female seen today at the request of her nurse for complaints of abdominal pain and distention.  Patient has chronic indwelling suprapubic catheter.  Nurse thinks may be recent increase in oxybutynin dose may be contributing to her discomfort.  Bowel movements are normal.  She is followed by urology and has an  appointment next week.  She says that her appetite is fair.  Past Medical History:  Diagnosis Date   Acute bronchitis 05/23/2011   Acute upper respiratory infections of unspecified site 05/23/2011   Arthritis    Chronic airway obstruction, not elsewhere classified 05/23/2011   Coronary artery disease    Disturbance of salivary secretion 01/31/2011   Dizziness and giddiness 01/31/2011   Dyspnea    Essential tremor 04/25/2014   External hemorrhoids without mention of complication 81/85/6314   Gait disorder 04/25/2014   GERD (gastroesophageal reflux disease)    History of kidney stones    Insomnia, unspecified 09/12/2011   Lumbago 01/31/2011   Major depressive disorder, single episode, unspecified 01/31/2011   Memory disorder 04/25/2014   Mitral valve disorders(424.0) 01/31/2011   Other and unspecified hyperlipidemia 01/31/2011   Other convulsions 01/31/2011   Other emphysema ([place]) 01/31/2011   Pain in joint, site unspecified 01/31/2011   Restless legs syndrome (RLS) 09/12/2011   Retinal detachment with retinal defect of right eye 2011   right eye twice   Seizure disorder ([place])    Senile osteoporosis 01/31/2011   Spontaneous ecchymoses 01/31/2011   Squamous cell carcinoma of leg    Stiffness of joints, not elsewhere classified, multiple sites 01/31/2011   Unspecified essential hypertension 01/31/2011   Past Surgical History:  Procedure Laterality Date   ABDOMINAL HYSTERECTOMY  06/21/2003   TAH/BSO, omenectomy PSB resect, Stg IC cystadenofibroma   CHOLECYSTECTOMY  2005   Dr. Marlou Starks   CYSTOSCOPY W/ URETERAL STENT PLACEMENT Right 02/13/2022   Procedure: CYSTOSCOPY WITH RETROGRADE PYELOGRAM/URETERAL STENT PLACEMENT, AND SUPRAPUBIC  TUBE EXCHANGE;  Surgeon: Alexis Frock, MD;  Location: [facility];  Service: Urology;  Laterality: Right;   CYSTOSCOPY WITH RETROGRADE PYELOGRAM, URETEROSCOPY AND STENT PLACEMENT Right 03/06/2022   Procedure: CYSTOSCOPY WITH RETROGRADE PYELOGRAM,  URETEROSCOPY AND STENT REPLACEMENT, SUPRAPUBIC CATHETER EXCHANGE;  Surgeon: Alexis Frock, MD;  Location: [facility];  Service: Urology;  Laterality: Right;   ELBOW SURGERY Right 2008   broken   Dr. Apolonio Schneiders   EYE SURGERY     HOLMIUM LASER APPLICATION Right 56/21/3086   Procedure: HOLMIUM LASER APPLICATION;  Surgeon: Alexis Frock, MD;  Location: [facility];  Service: Urology;  Laterality: Right;   INTRAMEDULLARY (IM) NAIL INTERTROCHANTERIC Left 08/30/2021   Procedure: INTRAMEDULLARY (IM) NAIL INTERTROCHANTRIC;  Surgeon: Rod Can, MD;  Location: [facility];  Service: Orthopedics;  Laterality: Left;   ORIF PATELLA Left 05/02/2020   Procedure: OPEN REDUCTION INTERNAL (ORIF) FIXATION LEFT PATELLA WITH MEDIAL AND LATERAL LIGAMENT REINFORCEMENTS;  Surgeon: [name], MD;  Location: [facility];  Service: Orthopedics;  Laterality: Left;   ORIF PATELLA Left 05/30/2020   Procedure: OPEN REDUCTION INTERNAL (ORIF) FIXATION PATELLA;  Surgeon: [name], MD;  Location: [facility];  Service: Orthopedics;  Laterality: Left;   RETINAL DETACHMENT SURGERY N/A    two   REVERSE SHOULDER ARTHROPLASTY Left 05/06/2019   Procedure: REVERSE SHOULDER ARTHROPLASTY;  Surgeon: Justice Britain, MD;  Location: [facility];  Service: Orthopedics;  Laterality: Left;  126mn   ROTATOR CUFF REPAIR Right 2012   Dr. CTheda Sers  SQUAMOUS CELL CARCINOMA EXCISION Bilateral 2012, 8/14   [place] on legs   Dr. GSarajane Jews  TONSILLECTOMY  1941   VIDEO BRONCHOSCOPY WITH ENDOBRONCHIAL NAVIGATION N/A 11/29/2015   Procedure: VIDEO BRONCHOSCOPY WITH ENDOBRONCHIAL NAVIGATION;  Surgeon: RCollene Gobble MD;  [facility]  Service: Thoracic;  Laterality: N/A;    reports that she quit smoking about 34 years ago. Her smoking use included cigarettes. She has a 40.00 pack-year smoking history. She has never used smokeless tobacco. She reports that she does not drink alcohol and does not use drugs. 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  Tobacco Use   Smoking status: Former    Packs/day: 1.00    Years: 40.00    Total pack years: 40.00    Types: Cigarettes    Quit date: 04/08/1988    Years since quitting: 34.0   Smokeless tobacco: Never   Tobacco comments:    [place]   Vaping Use: Never used  Substance and Sexual Activity   Alcohol use: No    Alcohol/week: 0.0 standard drinks of alcohol   Drug use: No   Sexual activity: Never  Other Topics Concern   Not on file  Social History Narrative   [facility]  No [facility]   Divorced   Exercise climbs steps 4 flights daily PT for balance   Alcohol none   Stopped smoking 1989   Patient drinks 3 large sodas daily.   Patient is right handed.   Social Determinants of Health   Financial Resource Strain: Not on file  Food Insecurity: No Food Insecurity (02/12/2022)   Hunger Vital Sign    Worried About Running Out of Food  in the Last Year: Never true    [place] in the Last Year: Never true  Transportation Needs: No Transportation Needs (02/12/2022)   PRAPARE - Hydrologist (Medical): No    Lack of Transportation (Non-Medical): No  Physical Activity: Not on file  Stress: Not on file  Social Connections: Not on file  Intimate Partner Violence: Not At Risk (02/12/2022)   Humiliation, Afraid, Rape, and Kick questionnaire    Fear of Current or Ex-Partner: No    Emotionally Abused: No    Physically Abused: No    Sexually Abused: No    Functional Status Survey:    Family History  Problem Relation Age of Onset   Heart disease Father        CHF   Cancer Mother        breast   Seizures Sister     Health Maintenance  Topic Date Due   DTaP/Tdap/Td (5 - Td or Tdap) 08/16/2025    Pneumonia Vaccine 33+ Years old  Completed   INFLUENZA VACCINE  Completed   DEXA SCAN  Completed   Zoster Vaccines- Shingrix  Completed   HPV VACCINES  Aged Out   MAMMOGRAM  Discontinued   COVID-19 Vaccine  Discontinued    Allergies  Allergen Reactions   Vioxx [Rofecoxib] Shortness Of Breath   Dyazide [Hydrochlorothiazide W-Triamterene] Other (See Comments)    Lowers blood pressure too much   Latex Swelling and Other (See Comments)    "Allergic," per [facility]   Sulfa Antibiotics Nausea And Vomiting and Other (See Comments)    "Allergic," per [facility]   Sumycin [Tetracycline] Other (See Comments)    Can't take due to drug reaction- "Allergic," per [facility]    Outpatient Encounter Medications as of 04/30/2022  Medication Sig   acetaminophen (TYLENOL) 325 MG tablet Take 650 mg by mouth every 4 (four) hours as needed for fever or mild pain. Give 2 tablet by mouth every 6 hours as needed for Pain   albuterol (ACCUNEB) 1.25 MG/3ML nebulizer solution Take 1 ampule by nebulization every 6 (six) hours as needed for shortness of breath.   Albuterol Sulfate (PROAIR RESPICLICK) 606 (90 Base) MCG/ACT AEPB Inhale 2 puffs into the lungs daily as needed (shortness of breath or wheezing).   carvedilol (COREG) 3.125 MG tablet Take 1 tablet (3.125 mg total) by mouth 2 (two) times daily with a meal.   fluticasone (VERAMYST) 27.5 MCG/SPRAY nasal spray Place 1 spray into the nose daily.   hydrocortisone 2.5 % lotion Apply 1 application  topically 2 (two) times daily as needed (face and ears as need for psoriasis).   hydrocortisone cream 1 % Apply 1 application  topically every 12 (twelve) hours as needed for itching (dry skin related to psoriasis).   ipratropium (ATROVENT) 0.02 % nebulizer solution Take 2.5 mLs (0.5 mg total) by nebulization 3 (three) times daily for 3 days, THEN 2.5 mLs (0.5 mg total) every 6 (six) hours as needed for up to 3 days. (Patient not taking: Reported on 02/27/2022)   ketoconazole (NIZORAL) 2 %  cream Apply 1 application  topically daily as needed (psoriasis).   ketoconazole (NIZORAL) 2 % shampoo Apply 1 application  topically See admin instructions. Apply topically to scalp on Mondays and Thursdays for hair care   levalbuterol (XOPENEX) 0.63 MG/3ML nebulizer solution Take 3 mLs (0.63 mg total) by nebulization 3 (three) times daily for 3 days, THEN 3 mLs (0.63 mg total) every 6 (six)  hours as needed for up to 3 days. (Patient not taking: Reported on 02/27/2022)   nystatin (MYCOSTATIN/NYSTOP) powder Apply 1 application  topically every 12 (twelve) hours as needed (urogenital area).   ondansetron (ZOFRAN) 4 MG tablet Take 1 tablet (4 mg total) by mouth every 6 (six) hours as needed for nausea.   oxybutynin (DITROPAN) 5 MG tablet Take 1 tablet (5 mg total) by mouth 3 (three) times daily.   pramipexole (MIRAPEX) 0.5 MG tablet Take 0.5 mg by mouth at bedtime.   traMADol (ULTRAM) 50 MG tablet Take 1 tablet (50 mg total) by mouth every 8 (eight) hours as needed. For post-op pain   triamcinolone (KENALOG) 0.1 % Apply 1 application. topically daily as needed (psoriasis).   No facility-administered encounter medications on file as of 04/30/2022.    Review of Systems  Gastrointestinal:  Positive for abdominal pain.  Genitourinary:  Positive for frequency.  All other systems reviewed and are negative.   There were no vitals filed for this visit. There is no height or weight on file to calculate BMI. Physical Exam Vitals and nursing note reviewed.  Constitutional:      Appearance: Normal appearance.  Cardiovascular:     Rate and Rhythm: Normal rate.  Pulmonary:     Effort: Pulmonary effort is normal.  Abdominal:     General: There is distension.     Palpations: Abdomen is soft.     Comments: Bowel sounds are decreased  Neurological:     General: No focal deficit present.     Mental Status: She is alert and oriented to person, place, and time.     Labs reviewed: Basic Metabolic  Panel: Recent Labs    09/02/21 0359 09/04/21 1308 02/13/22 0543 02/14/22 0425 02/15/22 0427 02/16/22 0904 02/21/22 0000  NA 138   < > 141 142 141 139 142  K 3.2*   < > 3.8 3.1* 3.2* 3.4* 3.3*  CL 104   < > 112* 117* 115* 111 108  CO2 27   < > 22 18* 21* 24 27*  GLUCOSE 98   < > 97 87 100* 100*  --   BUN 21   < > 36* 34* '21 15 11  '$ CREATININE 0.78   < > 0.87 0.70 0.52 0.60 0.6  CALCIUM 8.5*   < > 8.3* 7.9* 8.2* 8.2* 8.1*  MG 2.2  --  2.3 2.1  --   --   --    < > = values in this interval not displayed.   Liver Function Tests: Recent Labs    11/02/21 0000 11/04/21 1047 12/11/21 0000  AST 19 20 12*  ALT '11 12 8  '$ ALKPHOS 160* 157* 137*  BILITOT  --  0.5  --   PROT  --  7.0  --   ALBUMIN 3.7 3.6 3.0*   No results for input(s): "LIPASE", "AMYLASE" in the last 8760 hours. No results for input(s): "AMMONIA" in the last 8760 hours. CBC: Recent Labs    11/04/21 1047 12/11/21 0000 02/12/22 1831 02/14/22 0425 02/15/22 0427 02/16/22 0904 02/21/22 0000  WBC 4.7 4.0   < > 11.7* 9.3 9.7 6.3  NEUTROABS 3.2 2,352.00  --   --   --   --  4,082.00  HGB 12.9 11.7*   < > 9.9* 10.4* 10.9* 10.2*  HCT 41.6 37   < > 32.6* 34.2* 37.0 32*  MCV 83.7  --    < > 85.1 84.7 86.9  --  PLT 252 179   < > 218 231 282 329   < > = values in this interval not displayed.   Cardiac Enzymes: No results for input(s): "CKTOTAL", "CKMB", "CKMBINDEX", "TROPONINI" in the last 8760 hours. BNP: Invalid input(s): "POCBNP" No results found for: "HGBA1C" Lab Results  Component Value Date   TSH 1.198 02/15/2022   Lab Results  Component Value Date   VITAMINB12 249 09/05/2021   Lab Results  Component Value Date   FOLATE 23.1 09/05/2021   Lab Results  Component Value Date   IRON 64 09/05/2021   TIBC 369 09/05/2021   FERRITIN 31 09/05/2021    Imaging and Procedures obtained prior to SNF admission: DG C-Arm 1-60 Min-No Report  Result Date: 03/06/2022 Fluoroscopy was utilized by the requesting  physician.  No radiographic interpretation.   DG C-Arm 1-60 Min-No Report  Result Date: 03/06/2022 Fluoroscopy was utilized by the requesting physician.  No radiographic interpretation.    Assessment/Plan 1. Suprapubic catheter [facility]) Patient thinks this is related to bladder spasms.  Toward that end I will discontinue the oxybutynin and but and begin Uribel with instructions to drink more water.  Will also do a urine culture. I do have a concern that there could be some GI issues since bowel sounds are so scarce. Will follow    Family/ staff Communication:   Labs/tests ordered:  .smmsig

## 2022-05-03 ENCOUNTER — Encounter: Payer: Self-pay | Admitting: Nurse Practitioner

## 2022-05-03 DIAGNOSIS — K567 Ileus, unspecified: Secondary | ICD-10-CM | POA: Insufficient documentation

## 2022-05-06 ENCOUNTER — Non-Acute Institutional Stay (SKILLED_NURSING_FACILITY): Payer: Medicare PPO | Admitting: Nurse Practitioner

## 2022-05-06 ENCOUNTER — Encounter: Payer: Self-pay | Admitting: Nurse Practitioner

## 2022-05-06 DIAGNOSIS — K567 Ileus, unspecified: Secondary | ICD-10-CM | POA: Diagnosis not present

## 2022-05-06 DIAGNOSIS — Z9359 Other cystostomy status: Secondary | ICD-10-CM | POA: Diagnosis not present

## 2022-05-06 DIAGNOSIS — G2581 Restless legs syndrome: Secondary | ICD-10-CM | POA: Diagnosis not present

## 2022-05-06 DIAGNOSIS — I471 Supraventricular tachycardia, unspecified: Secondary | ICD-10-CM

## 2022-05-06 DIAGNOSIS — E876 Hypokalemia: Secondary | ICD-10-CM

## 2022-05-06 NOTE — Progress Notes (Signed)
Location:   SNF [place] Room Number: 56 Place of Service:  SNF (31) Provider: Lennie Odor Crystalynn Mcinerney NP  Jerardo Costabile X, NP  Patient Care Team: Rollande Thursby X, NP as PCP - General (Internal Medicine) [name], MD as Consulting Physician (Ophthalmology) [name], MD as Consulting Physician (Cardiology) [name], MD as Consulting Physician (Dermatology) [name], MD as Consulting Physician (Orthopedic Surgery) [name], MD as Consulting Physician (Orthopedic Surgery) [name], MD as Consulting Physician (Orthopedic Surgery) Arthur, Randall, Vastie Douty X, NP as Nurse Practitioner (Nurse Practitioner) [name], MD (Inactive) as Consulting Physician (Neurology)  Extended Emergency Contact Information Primary Emergency Contact: [name],[name] Address: [address]          [place], [place] [address] [name] of [place] Phone: [phone] Relation: Sister Secondary Emergency Contact: [name] [place] Phone: [phone] Mobile Phone: [phone] Relation: Sister  Code Status: DNR Goals of care: Advanced Directive information    04/23/2022    2:15 PM  Advanced Directives  Does Patient Have a Medical Advance Directive? Yes  Type of Advance Directive Out of facility DNR (pink MOST or yellow form)  Does patient want to make changes to medical advance directive? No - Patient declined  Pre-existing out of facility DNR order (yellow form or pink MOST form) Pink MOST/Yellow Form most recent copy in chart - Physician notified to receive inpatient order     Chief Complaint  Patient presents with   Acute Visit    Loose stools.     HPI:  Pt is a 89 y.o. female seen today for an acute visit for loose stools x2 today, no further nausea, vomiting, or abd pain. 05/03/22 abd [place] no renal or gallbladder stone or hydronephrosis. 05/02/22 Xray abd may represent ileus, s/p liquid diet, fleet enema  for initial symptoms of abd discomfort, nausea, vomiting, no constipation. Urine specimen showed mixed genital flora.     02/12/22 Obstructive nephritis, underwent cystoscopy with right retrograde pyelogram, right ureteral stent placed, f/u Urology Dr. Tresa Moore, 03/06/22 cystoscopy with retrograde pyelogram, ureteroscopy and stent replacement, SPC exchange.              Ureteral stone with hydronephrosis per CT abd             Hypokalemia, K 3.3 02/21/22             Anemia, Hgb 10.3 02/21/22             SVT, takes Coreg             BLE edema, not apparent, off Furosemide.              Adult failure to thrive, under Hospice service.              Suprapubic Catheter, f/u Urology, on Uribel.              Depression, stable             Dementia, under hospice service             Restless leg syndrome, stable, taking MiraPex.              Hx of seizures, no active seizures, off meds.              COPD, on Atrovent, Xopenx    Past Medical History:  Diagnosis Date   Acute bronchitis  05/23/2011   Acute upper respiratory infections of unspecified site 05/23/2011   Arthritis    Chronic airway obstruction, not elsewhere classified 05/23/2011   Coronary artery disease    Disturbance of salivary secretion 01/31/2011   Dizziness and giddiness 01/31/2011   Dyspnea    Essential tremor 04/25/2014   External hemorrhoids without mention of complication 60/63/0160   Gait disorder 04/25/2014   GERD (gastroesophageal reflux disease)    History of kidney stones    Insomnia, unspecified 09/12/2011   Lumbago 01/31/2011   Major depressive disorder, single episode, unspecified 01/31/2011   Memory disorder 04/25/2014   Mitral valve disorders(424.0) 01/31/2011   Other and unspecified hyperlipidemia 01/31/2011   Other convulsions 01/31/2011   Other emphysema ([place]) 01/31/2011   Pain in joint, site unspecified 01/31/2011   Restless legs syndrome (RLS) 09/12/2011   Retinal detachment with retinal defect of  right eye 2011   right eye twice   Seizure disorder ([place])    Senile osteoporosis 01/31/2011   Spontaneous ecchymoses 01/31/2011   Squamous cell carcinoma of leg    Stiffness of joints, not elsewhere classified, multiple sites 01/31/2011   Unspecified essential hypertension 01/31/2011   Past Surgical History:  Procedure Laterality Date   ABDOMINAL HYSTERECTOMY  06/21/2003   TAH/BSO, omenectomy PSB resect, Stg IC cystadenofibroma   CHOLECYSTECTOMY  2005   Dr. Marlou Starks   CYSTOSCOPY W/ URETERAL STENT PLACEMENT Right 02/13/2022   Procedure: CYSTOSCOPY WITH RETROGRADE PYELOGRAM/URETERAL STENT PLACEMENT, AND SUPRAPUBIC TUBE EXCHANGE;  Surgeon: Alexis Frock, MD;  Location: [facility];  Service: Urology;  Laterality: Right;   CYSTOSCOPY WITH RETROGRADE PYELOGRAM, URETEROSCOPY AND STENT PLACEMENT Right 03/06/2022   Procedure: CYSTOSCOPY WITH RETROGRADE PYELOGRAM, URETEROSCOPY AND STENT REPLACEMENT, SUPRAPUBIC CATHETER EXCHANGE;  Surgeon: Alexis Frock, MD;  Location: [facility];  Service: Urology;  Laterality: Right;   ELBOW SURGERY Right 2008   broken   Dr. Apolonio Schneiders   EYE SURGERY     HOLMIUM LASER APPLICATION Right 10/93/2355   Procedure: HOLMIUM LASER APPLICATION;  Surgeon: Alexis Frock, MD;  Location: [facility];  Service: Urology;  Laterality: Right;   INTRAMEDULLARY (IM) NAIL INTERTROCHANTERIC Left 08/30/2021   Procedure: INTRAMEDULLARY (IM) NAIL INTERTROCHANTRIC;  Surgeon: Rod Can, MD;  Location: [facility];  Service: Orthopedics;  Laterality: Left;   ORIF PATELLA Left 05/02/2020   Procedure: OPEN REDUCTION INTERNAL (ORIF) FIXATION LEFT PATELLA WITH MEDIAL AND LATERAL LIGAMENT REINFORCEMENTS;  Surgeon: [name], MD;  Location: [facility];  Service: Orthopedics;  Laterality: Left;   ORIF PATELLA Left 05/30/2020   Procedure: OPEN REDUCTION INTERNAL (ORIF) FIXATION PATELLA;  Surgeon: [name], MD;  Location: [facility];  Service: Orthopedics;  Laterality: Left;   RETINAL DETACHMENT SURGERY N/A     two   REVERSE SHOULDER ARTHROPLASTY Left 05/06/2019   Procedure: REVERSE SHOULDER ARTHROPLASTY;  Surgeon: Justice Britain, MD;  Location: [facility];  Service: Orthopedics;  Laterality: Left;  13mn   ROTATOR CUFF REPAIR Right 2012   Dr. CTheda Sers  SQUAMOUS CELL CARCINOMA EXCISION Bilateral 2012, 8/14   [place] on legs   Dr. GSarajane Jews  TONSILLECTOMY  1941   VIDEO BRONCHOSCOPY WITH ENDOBRONCHIAL NAVIGATION N/A 11/29/2015   Procedure: VIDEO BRONCHOSCOPY WITH ENDOBRONCHIAL NAVIGATION;  Surgeon: RCollene Gobble MD;  Location: [facility];  Service: Thoracic;  Laterality: N/A;    Allergies  Allergen Reactions   Vioxx [Rofecoxib] Shortness Of Breath   Dyazide [Hydrochlorothiazide W-Triamterene] Other (See Comments)    Lowers blood pressure too much   Latex Swelling and  Other (See Comments)    "Allergic," per [facility]   Sulfa Antibiotics Nausea And Vomiting and Other (See Comments)    "Allergic," per [facility]   Sumycin [Tetracycline] Other (See Comments)    Can't take due to drug reaction- "Allergic," per [facility]    Allergies as of 05/06/2022       Reactions   Vioxx [rofecoxib] Shortness Of Breath   Dyazide [hydrochlorothiazide W-triamterene] Other (See Comments)   Lowers blood pressure too much   Latex Swelling, Other (See Comments)   "Allergic," per [facility]   Sulfa Antibiotics Nausea And Vomiting, Other (See Comments)   "Allergic," per [facility]   Sumycin [tetracycline] Other (See Comments)   Can't take due to drug reaction- "Allergic," per [facility]        Medication List        Accurate as of May 06, 2022 11:59 PM. If you have any questions, ask your nurse or doctor.          acetaminophen 325 MG tablet Commonly known as: TYLENOL Take 650 mg by mouth every 4 (four) hours as needed for fever or mild pain. Give 2 tablet by mouth every 6 hours as needed for Pain   carvedilol 3.125 MG tablet Commonly known as: COREG Take 1 tablet (3.125 mg total) by mouth 2 (two) times daily with a meal.   fluticasone  27.5 MCG/SPRAY nasal spray Commonly known as: VERAMYST Place 1 spray into the nose daily.   hydrocortisone cream 1 % Apply 1 application  topically every 12 (twelve) hours as needed for itching (dry skin related to psoriasis).   hydrocortisone 2.5 % lotion Apply 1 application  topically 2 (two) times daily as needed (face and ears as need for psoriasis).   ipratropium 0.02 % nebulizer solution Commonly known as: ATROVENT Take 2.5 mLs (0.5 mg total) by nebulization 3 (three) times daily for 3 days, THEN 2.5 mLs (0.5 mg total) every 6 (six) hours as needed for up to 3 days. Start taking on: February 16, 2022   ketoconazole 2 % shampoo Commonly known as: NIZORAL Apply 1 application  topically See admin instructions. Apply topically to scalp on Mondays and Thursdays for hair care   ketoconazole 2 % cream Commonly known as: NIZORAL Apply 1 application  topically daily as needed (psoriasis).   levalbuterol 0.63 MG/3ML nebulizer solution Commonly known as: XOPENEX Take 3 mLs (0.63 mg total) by nebulization 3 (three) times daily for 3 days, THEN 3 mLs (0.63 mg total) every 6 (six) hours as needed for up to 3 days. Start taking on: February 16, 2022   nystatin powder Commonly known as: MYCOSTATIN/NYSTOP Apply 1 application  topically every 12 (twelve) hours as needed (urogenital area).   ondansetron 4 MG tablet Commonly known as: ZOFRAN Take 1 tablet (4 mg total) by mouth every 6 (six) hours as needed for nausea.   oxybutynin 5 MG tablet Commonly known as: DITROPAN Take 1 tablet (5 mg total) by mouth 3 (three) times daily.   pramipexole 0.5 MG tablet Commonly known as: MIRAPEX Take 0.5 mg by mouth at bedtime.   ProAir RespiClick 034 (90 Base) MCG/ACT Aepb Generic drug: Albuterol Sulfate Inhale 2 puffs into the lungs daily as needed (shortness of breath or wheezing).   albuterol 1.25 MG/3ML nebulizer solution Commonly known as: ACCUNEB Take 1 ampule by nebulization every 6  (six) hours as needed for shortness of breath.   traMADol 50 MG tablet Commonly known as: Ultram Take 1 tablet (50 mg total) by  mouth every 8 (eight) hours as needed. For post-op pain   triamcinolone cream 0.1 % Commonly known as: KENALOG Apply 1 application. topically daily as needed (psoriasis).        Review of Systems  Constitutional:  Negative for appetite change, fatigue and fever.  HENT:  Positive for hearing loss. Negative for congestion and trouble swallowing.   Eyes:  Negative for visual disturbance.  Respiratory:  Negative for cough.        DOE is chronic  Cardiovascular:  Positive for leg swelling.  Gastrointestinal:  Positive for diarrhea. Negative for nausea and vomiting.       Acid reflux symptoms.   Genitourinary:  Positive for difficulty urinating. Negative for hematuria.       [facility]  Musculoskeletal:  Positive for arthralgias and gait problem.       S/p left hip ORIF, ORIF of the left patella.   Skin:  Positive for wound. Negative for color change.       BLE discoloration, chronic venous insufficiency skin changes   Neurological:  Negative for seizures, speech difficulty, weakness and headaches.       Memory lapses. Hx of seizures. RLS  Psychiatric/Behavioral:  Negative for behavioral problems and sleep disturbance. The patient is not nervous/anxious.     Immunization History  Administered Date(s) Administered   Influenza Split 01/06/2014, 01/15/2017, 01/08/2018, 12/09/2018   Influenza Whole 01/07/2012, 01/06/2013   Influenza, High Dose Seasonal PF 12/25/2015, 01/20/2017   Influenza,inj,Quad PF,6+ Mos 12/21/2014   Influenza-Unspecified 12/09/2018, 01/18/2020, 01/24/2021, 01/30/2022   Moderna SARS-COV2 Booster Vaccination 04/02/2021   Moderna Sars-Covid-2 Vaccination 04/12/2019, 06/05/2019, 02/15/2020, 09/05/2020   PFIZER(Purple Top)SARS-COV-2 Vaccination 12/26/2020   PPD Test 02/16/2022   Pneumococcal Conjugate-13 02/01/2014   Pneumococcal  Polysaccharide-23 12/20/1992, 01/15/2000, 04/08/2004, 06/08/2004   Td 04/08/2002, 04/22/2002   Tdap 04/09/2011, 08/17/2015   Zoster Recombinat (Shingrix) 05/29/2005, 08/27/2017   Zoster, Live 04/08/2008, 05/29/2014, 05/28/2017   Zoster, Unspecified 05/29/2005   Pertinent  Health Maintenance Due  Topic Date Due   INFLUENZA VACCINE  Completed   DEXA SCAN  Completed   MAMMOGRAM  Discontinued      02/14/2022    7:47 PM 02/15/2022    3:00 PM 02/15/2022    9:00 PM 02/16/2022    8:00 AM 03/06/2022    7:25 AM  Fall Risk  (RETIRED) Patient Fall Risk Level Low fall risk Low fall risk Moderate fall risk Moderate fall risk High fall risk   Functional Status Survey:    Vitals:   05/06/22 1427  BP: 117/82  Pulse: 69  Resp: 17  Temp: (!) 97.4 F (36.3 C)  SpO2: 92%  Weight: 143 lb 8 oz (65.1 kg)   Body mass index is 25.42 kg/m. Physical Exam Vitals and nursing note reviewed.  Constitutional:      Appearance: Normal appearance.  HENT:     Head: Normocephalic and atraumatic.     Nose: Nose normal.     Mouth/Throat:     Mouth: Mucous membranes are moist.  Eyes:     Extraocular Movements: Extraocular movements intact.     Conjunctiva/sclera: Conjunctivae normal.     Right eye: Right conjunctiva is not injected.     Left eye: Left conjunctiva is not injected.     Pupils: Pupils are equal, round, and reactive to light.  Neck:     Comments: Left adam's apple bony aspect, sometimes feels soreness on palpation is chronic, no noted lymph nodes  Cardiovascular:     Rate and  Rhythm: Normal rate and regular rhythm.     Heart sounds: Murmur heard.     Comments: PD pulses are not felt from previous examination.  Pulmonary:     Effort: Pulmonary effort is normal.     Breath sounds: No rales.     Comments: Decreased air entry to both lungs.  Abdominal:     General: Bowel sounds are normal.     Palpations: Abdomen is soft.     Tenderness: There is no abdominal tenderness. There is no  right CVA tenderness, left CVA tenderness, guarding or rebound.     Comments: Mid abd surgical scar. SPC  Genitourinary:    Comments: SPC, tea colored urine Musculoskeletal:     Cervical back: Normal range of motion and neck supple.     Right lower leg: No edema.     Left lower leg: No edema.     Comments: Decreased overhead ROM of the left shoulder. Left knee s/p ORIF of the patella fx. Trace edema BLE  Skin:    General: Skin is warm and dry.     Comments: BLE discoloration, chronic venous insufficiency skin changes, lateral left lower leg slow healing previous ruptured hematoma. A quarter sized red, warmth, raised, fluid filled area on the previous left knee surgical incision-resolved   Neurological:     General: No focal deficit present.     Mental Status: She is alert. Mental status is at baseline.     Gait: Gait abnormal.  Psychiatric:        Mood and Affect: Mood normal.        Behavior: Behavior normal.     Labs reviewed: Recent Labs    09/02/21 0359 09/04/21 1308 02/13/22 0543 02/14/22 0425 02/15/22 0427 02/16/22 0904 02/21/22 0000  NA 138   < > 141 142 141 139 142  K 3.2*   < > 3.8 3.1* 3.2* 3.4* 3.3*  CL 104   < > 112* 117* 115* 111 108  CO2 27   < > 22 18* 21* 24 27*  GLUCOSE 98   < > 97 87 100* 100*  --   BUN 21   < > 36* 34* '21 15 11  '$ CREATININE 0.78   < > 0.87 0.70 0.52 0.60 0.6  CALCIUM 8.5*   < > 8.3* 7.9* 8.2* 8.2* 8.1*  MG 2.2  --  2.3 2.1  --   --   --    < > = values in this interval not displayed.   Recent Labs    11/02/21 0000 11/04/21 1047 12/11/21 0000  AST 19 20 12*  ALT '11 12 8  '$ ALKPHOS 160* 157* 137*  BILITOT  --  0.5  --   PROT  --  7.0  --   ALBUMIN 3.7 3.6 3.0*   Recent Labs    11/04/21 1047 12/11/21 0000 02/12/22 1831 02/14/22 0425 02/15/22 0427 02/16/22 0904 02/21/22 0000  WBC 4.7 4.0   < > 11.7* 9.3 9.7 6.3  NEUTROABS 3.2 2,352.00  --   --   --   --  4,082.00  HGB 12.9 11.7*   < > 9.9* 10.4* 10.9* 10.2*  HCT 41.6 37    < > 32.6* 34.2* 37.0 32*  MCV 83.7  --    < > 85.1 84.7 86.9  --   PLT 252 179   < > 218 231 282 329   < > = values in this interval not displayed.   Lab Results  Component Value Date   TSH 1.198 02/15/2022   No results found for: "HGBA1C" Lab Results  Component Value Date   CHOL 147 09/27/2021   HDL 48 09/27/2021   LDLCALC 78 09/27/2021   TRIG 131 09/27/2021   CHOLHDL 3.6 06/27/2015    Significant Diagnostic Results in last 30 days:  No results found.  Assessment/Plan: Ileus (HCC) loose stools x2 today, no further nausea, vomiting, or abd pain. 05/03/22 abd [place] no renal or gallbladder stone or hydronephrosis. 05/02/22 Xray abd may represent ileus, s/p liquid diet, fleet enema for initial symptoms of abd discomfort, nausea, vomiting, no constipation. Urine specimen showed mixed genital flora.  Monitor for s/s of UTI and GI symptoms, recollect UA C/S if indicated Pepto Bismol '30mg'$  bid x 5 days for loose stools.  Update CBC/diff, CMP/eGFR    Suprapubic catheter [facility]) f/u Urology, on Uribel.   Restless legs syndrome (RLS) stable, taking MiraPex.   SVT (supraventricular tachycardia) Heart rate is controlled, continue Coreg.   Hypokalemia 05/07/22 Na 141, K 3.2, Bun 15, creat 0.64, wbc 5.0, Hgb 11.4, plt 279, neutrophils 56.3, start Kcl 71mq po qd.     Family/ staff Communication: plan of care reviewed with the patient and charge nurse.   Labs/tests ordered: CBC/diff, CMP/eGFR in am.   Time spend 35 minutes.

## 2022-05-06 NOTE — Assessment & Plan Note (Signed)
f/u Urology, on Uribel.

## 2022-05-06 NOTE — Assessment & Plan Note (Addendum)
loose stools x2 today, no further nausea, vomiting, or abd pain. 05/03/22 abd [place] no renal or gallbladder stone or hydronephrosis. 05/02/22 Xray abd may represent ileus, s/p liquid diet, fleet enema for initial symptoms of abd discomfort, nausea, vomiting, no constipation. Urine specimen showed mixed genital flora.  Monitor for s/s of UTI and GI symptoms, recollect UA C/S if indicated Pepto Bismol '30mg'$  bid x 5 days for loose stools.  Update CBC/diff, CMP/eGFR

## 2022-05-06 NOTE — Assessment & Plan Note (Signed)
Heart rate is controlled, continue Coreg.

## 2022-05-06 NOTE — Assessment & Plan Note (Signed)
stable, taking MiraPex.  

## 2022-05-07 LAB — CBC AND DIFFERENTIAL
HCT: 36 (ref 36–46)
Hemoglobin: 11.4 — AB (ref 12.0–16.0)
Neutrophils Absolute: 2815
Platelets: 279 10*3/uL (ref 150–400)
WBC: 5

## 2022-05-07 LAB — BASIC METABOLIC PANEL
BUN: 15 (ref 4–21)
CO2: 29 — AB (ref 13–22)
Chloride: 104 (ref 99–108)
Creatinine: 0.6 (ref 0.5–1.1)
Glucose: 76
Potassium: 3.2 mEq/L — AB (ref 3.5–5.1)
Sodium: 141 (ref 137–147)

## 2022-05-07 LAB — COMPREHENSIVE METABOLIC PANEL
Albumin: 3.3 — AB (ref 3.5–5.0)
Calcium: 8.8 (ref 8.7–10.7)
Globulin: 2.3

## 2022-05-07 LAB — CBC: RBC: 4.61 (ref 3.87–5.11)

## 2022-05-07 LAB — HEPATIC FUNCTION PANEL
ALT: 6 U/L — AB (ref 7–35)
AST: 12 — AB (ref 13–35)
Alkaline Phosphatase: 100 (ref 25–125)
Bilirubin, Total: 0.4

## 2022-05-07 NOTE — Assessment & Plan Note (Signed)
05/07/22 Na 141, K 3.2, Bun 15, creat 0.64, wbc 5.0, Hgb 11.4, plt 279, neutrophils 56.3, start Kcl 6mq po qd.

## 2022-05-20 ENCOUNTER — Encounter: Payer: Self-pay | Admitting: Nurse Practitioner

## 2022-05-20 ENCOUNTER — Non-Acute Institutional Stay (SKILLED_NURSING_FACILITY): Payer: Medicare PPO | Admitting: Nurse Practitioner

## 2022-05-20 DIAGNOSIS — D638 Anemia in other chronic diseases classified elsewhere: Secondary | ICD-10-CM | POA: Diagnosis not present

## 2022-05-20 DIAGNOSIS — I471 Supraventricular tachycardia, unspecified: Secondary | ICD-10-CM

## 2022-05-20 DIAGNOSIS — N139 Obstructive and reflux uropathy, unspecified: Secondary | ICD-10-CM | POA: Diagnosis not present

## 2022-05-20 DIAGNOSIS — E876 Hypokalemia: Secondary | ICD-10-CM | POA: Diagnosis not present

## 2022-05-20 DIAGNOSIS — G2581 Restless legs syndrome: Secondary | ICD-10-CM

## 2022-05-20 DIAGNOSIS — J431 Panlobular emphysema: Secondary | ICD-10-CM

## 2022-05-20 DIAGNOSIS — K567 Ileus, unspecified: Secondary | ICD-10-CM

## 2022-05-20 DIAGNOSIS — R627 Adult failure to thrive: Secondary | ICD-10-CM

## 2022-05-20 NOTE — Assessment & Plan Note (Signed)
stable, taking MiraPex.

## 2022-05-20 NOTE — Assessment & Plan Note (Signed)
Hgb 11.4 05/07/22

## 2022-05-20 NOTE — Assessment & Plan Note (Signed)
Gradual weight loss, under Hospice service.

## 2022-05-20 NOTE — Assessment & Plan Note (Signed)
02/12/22 Obstructive nephritis, underwent cystoscopy with right retrograde pyelogram, right ureteral stent placed, f/u Urology Dr. Tresa Moore, 03/06/22 cystoscopy with retrograde pyelogram, ureteroscopy and stent replacement, SPC exchange.  Hx of   Ureteral stone with hydronephrosis per CT abd  05/13/22 Urology: R ureteral stone, sp R ureteroscopy stone free 03/2022, stent. SP  urethral stricture

## 2022-05-20 NOTE — Assessment & Plan Note (Signed)
on Atrovent, Xopenx

## 2022-05-20 NOTE — Assessment & Plan Note (Signed)
Heart rate is in control,  takes Coreg

## 2022-05-20 NOTE — Assessment & Plan Note (Signed)
K 3.2 05/07/22, continue Kcl

## 2022-05-20 NOTE — Assessment & Plan Note (Signed)
Resolved loose stools, nausea, vomiting, or abd pain after liquid diet, 05/03/22 abd [place] no renal or gallbladder stone or hydronephrosis. 05/02/22 Xray abd may represent ileus

## 2022-05-20 NOTE — Progress Notes (Signed)
Location:  [place] Room Number: NO/29/A Place of Service:  SNF (31) Provider:  Alexxa Sabet X, NP  Patient Care Team: Charmain Diosdado X, NP as PCP - General (Internal Medicine) [name], MD as Consulting Physician (Ophthalmology) [name], MD as Consulting Physician (Cardiology) [name], MD as Consulting Physician (Dermatology) [name], MD as Consulting Physician (Orthopedic Surgery) [name], MD as Consulting Physician (Orthopedic Surgery) [name], MD as Consulting Physician (Orthopedic Surgery) [place], [place], Lacye Mccarn X, NP as Nurse Practitioner (Nurse Practitioner) [name], MD (Inactive) as Consulting Physician (Neurology)  Extended Emergency Contact Information Primary Emergency Contact: [name],[name] Address: [address]          [place], [place] [address] [name] of [place] Phone: [phone] Relation: Sister Secondary Emergency Contact: [name] [place] of [place] Phone: [phone] Mobile Phone: [phone] Relation: Sister  Code Status:  DNR Goals of care: Advanced Directive information    05/20/2022    3:42 PM  Advanced Directives  Does Patient Have a Medical Advance Directive? Yes  Type of Advance Directive Out of facility DNR (pink MOST or yellow form)  Does patient want to make changes to medical advance directive? No - Patient declined  Pre-existing out of facility DNR order (yellow form or pink MOST form) Pink MOST/Yellow Form most recent copy in chart - Physician notified to receive inpatient order     Chief Complaint  Patient presents with   Medical Management of Chronic Issues    Patient is here for a follow up for chronic conditions     HPI:  Pt is a 89 y.o. female seen today for medical management of chronic diseases.      Resolved loose stools, nausea, vomiting, or abd pain after liquid diet, 05/03/22 abd [place] no renal or  gallbladder stone or hydronephrosis. 05/02/22 Xray abd may represent ileus                02/12/22 Obstructive nephritis, underwent cystoscopy with right retrograde pyelogram, right ureteral stent placed, f/u Urology Dr. Tresa Moore, 03/06/22 cystoscopy with retrograde pyelogram, ureteroscopy and stent replacement, SPC exchange.   05/13/22 Urology: R ureteral stone, sp R ureteroscopy stone free 03/2022, stent. SP  urethral stricture             Ureteral stone with hydronephrosis per CT abd             Hypokalemia, K 3.2 05/07/22, placed on Kcl             Anemia, Hgb 11.4 05/07/22             SVT, takes Coreg             BLE edema, not apparent, off Furosemide.              Adult failure to thrive, under Hospice service.              Suprapubic Catheter, f/u Urology, on Uribel.              Depression, stable             Dementia, under hospice service             Restless leg syndrome, stable, taking MiraPex.              Hx of seizures, no active seizures, off meds.  COPD, on Atrovent, Xopenx   Past Medical History:  Diagnosis Date   Acute bronchitis 05/23/2011   Acute upper respiratory infections of unspecified site 05/23/2011   Arthritis    Chronic airway obstruction, not elsewhere classified 05/23/2011   Coronary artery disease    Disturbance of salivary secretion 01/31/2011   Dizziness and giddiness 01/31/2011   Dyspnea    Essential tremor 04/25/2014   External hemorrhoids without mention of complication [id number]   Gait disorder 04/25/2014   GERD (gastroesophageal reflux disease)    History of kidney stones    Insomnia, unspecified 09/12/2011   Lumbago 01/31/2011   Major depressive disorder, single episode, unspecified 01/31/2011   Memory disorder 04/25/2014   Mitral valve disorders(424.0) 01/31/2011   Other and unspecified hyperlipidemia 01/31/2011   Other convulsions 01/31/2011   Other emphysema ([place]) 01/31/2011   Pain in joint, site unspecified 01/31/2011    Restless legs syndrome (RLS) 09/12/2011   Retinal detachment with retinal defect of right eye 2011   right eye twice   Seizure disorder ([place])    Senile osteoporosis 01/31/2011   Spontaneous ecchymoses 01/31/2011   Squamous cell carcinoma of leg    Stiffness of joints, not elsewhere classified, multiple sites 01/31/2011   Unspecified essential hypertension 01/31/2011   Past Surgical History:  Procedure Laterality Date   ABDOMINAL HYSTERECTOMY  06/21/2003   TAH/BSO, omenectomy PSB resect, Stg IC cystadenofibroma   CHOLECYSTECTOMY  2005   Dr. Marlou Starks   CYSTOSCOPY W/ URETERAL STENT PLACEMENT Right 02/13/2022   Procedure: CYSTOSCOPY WITH RETROGRADE PYELOGRAM/URETERAL STENT PLACEMENT, AND SUPRAPUBIC TUBE EXCHANGE;  Surgeon: Alexis Frock, MD;  Location: [facility];  Service: Urology;  Laterality: Right;   CYSTOSCOPY WITH RETROGRADE PYELOGRAM, URETEROSCOPY AND STENT PLACEMENT Right 03/06/2022   Procedure: CYSTOSCOPY WITH RETROGRADE PYELOGRAM, URETEROSCOPY AND STENT REPLACEMENT, SUPRAPUBIC CATHETER EXCHANGE;  Surgeon: Alexis Frock, MD;  Location: [facility];  Service: Urology;  Laterality: Right;   ELBOW SURGERY Right 2008   broken   Dr. Apolonio Schneiders   EYE SURGERY     HOLMIUM LASER APPLICATION Right [id number]   Procedure: HOLMIUM LASER APPLICATION;  Surgeon: Alexis Frock, MD;  Location: [facility];  Service: Urology;  Laterality: Right;   INTRAMEDULLARY (IM) NAIL INTERTROCHANTERIC Left 08/30/2021   Procedure: INTRAMEDULLARY (IM) NAIL INTERTROCHANTRIC;  Surgeon: Rod Can, MD;  Location: [facility];  Service: Orthopedics;  Laterality: Left;   ORIF PATELLA Left 05/02/2020   Procedure: OPEN REDUCTION INTERNAL (ORIF) FIXATION LEFT PATELLA WITH MEDIAL AND LATERAL LIGAMENT REINFORCEMENTS;  Surgeon: [name], MD;  Location: [facility];  Service: Orthopedics;  Laterality: Left;   ORIF PATELLA Left 05/30/2020   Procedure: OPEN REDUCTION INTERNAL (ORIF) FIXATION PATELLA;  Surgeon: [name], MD;  Location:  [facility];  Service: Orthopedics;  Laterality: Left;   RETINAL DETACHMENT SURGERY N/A    two   REVERSE SHOULDER ARTHROPLASTY Left 05/06/2019   Procedure: REVERSE SHOULDER ARTHROPLASTY;  Surgeon: Justice Britain, MD;  Location: [facility];  Service: Orthopedics;  Laterality: Left;  11mn   ROTATOR CUFF REPAIR Right 2012   Dr. CTheda Sers  SQUAMOUS CELL CARCINOMA EXCISION Bilateral 2012, 8/14   [place] on legs   Dr. GSarajane Jews  TONSILLECTOMY  1941   VIDEO BRONCHOSCOPY WITH ENDOBRONCHIAL NAVIGATION N/A 11/29/2015   Procedure: VIDEO BRONCHOSCOPY WITH ENDOBRONCHIAL NAVIGATION;  Surgeon: RCollene Gobble MD;  Location: [facility];  Service: Thoracic;  Laterality: N/A;    Allergies  Allergen Reactions   Vioxx [Rofecoxib] Shortness Of Breath   Dyazide [Hydrochlorothiazide W-Triamterene]  Other (See Comments)    Lowers blood pressure too much   Latex Swelling and Other (See Comments)    "Allergic," per [facility]   Sulfa Antibiotics Nausea And Vomiting and Other (See Comments)    "Allergic," per [facility]   Sumycin [Tetracycline] Other (See Comments)    Can't take due to drug reaction- "Allergic," per [facility]    Outpatient Encounter Medications as of 05/20/2022  Medication Sig   acetaminophen (TYLENOL) 325 MG tablet Take 650 mg by mouth every 4 (four) hours as needed for fever or mild pain. Give 2 tablet by mouth every 6 hours as needed for Pain   albuterol (ACCUNEB) 1.25 MG/3ML nebulizer solution Take 1 ampule by nebulization every 6 (six) hours as needed for shortness of breath.   Albuterol Sulfate (PROAIR RESPICLICK) 123XX123 (90 Base) MCG/ACT AEPB Inhale 2 puffs into the lungs daily as needed (shortness of breath or wheezing).   carvedilol (COREG) 3.125 MG tablet Take 1 tablet (3.125 mg total) by mouth 2 (two) times daily with a meal.   fluticasone (VERAMYST) 27.5 MCG/SPRAY nasal spray Place 1 spray into the nose daily.   hydrocortisone 2.5 % lotion Apply 1 application  topically 2 (two) times daily as needed (face and ears as need  for psoriasis).   hydrocortisone cream 1 % Apply 1 application  topically every 12 (twelve) hours as needed for itching (dry skin related to psoriasis).   ipratropium (ATROVENT) 0.02 % nebulizer solution Take 2.5 mLs (0.5 mg total) by nebulization 3 (three) times daily for 3 days, THEN 2.5 mLs (0.5 mg total) every 6 (six) hours as needed for up to 3 days. (Patient not taking: Reported on 02/27/2022)   ketoconazole (NIZORAL) 2 % cream Apply 1 application  topically daily as needed (psoriasis).   ketoconazole (NIZORAL) 2 % shampoo Apply 1 application  topically See admin instructions. Apply topically to scalp on Mondays and Thursdays for hair care   levalbuterol (XOPENEX) 0.63 MG/3ML nebulizer solution Take 3 mLs (0.63 mg total) by nebulization 3 (three) times daily for 3 days, THEN 3 mLs (0.63 mg total) every 6 (six) hours as needed for up to 3 days. (Patient not taking: Reported on 02/27/2022)   nystatin (MYCOSTATIN/NYSTOP) powder Apply 1 application  topically every 12 (twelve) hours as needed (urogenital area).   ondansetron (ZOFRAN) 4 MG tablet Take 1 tablet (4 mg total) by mouth every 6 (six) hours as needed for nausea.   oxybutynin (DITROPAN) 5 MG tablet Take 1 tablet (5 mg total) by mouth 3 (three) times daily.   pramipexole (MIRAPEX) 0.5 MG tablet Take 0.5 mg by mouth at bedtime.   traMADol (ULTRAM) 50 MG tablet Take 1 tablet (50 mg total) by mouth every 8 (eight) hours as needed. For post-op pain   triamcinolone (KENALOG) 0.1 % Apply 1 application. topically daily as needed (psoriasis).   No facility-administered encounter medications on file as of 05/20/2022.    Review of Systems  Constitutional:  Negative for appetite change, fatigue and fever.  HENT:  Positive for hearing loss. Negative for congestion and trouble swallowing.   Eyes:  Negative for visual disturbance.  Respiratory:  Negative for cough.        DOE is chronic  Cardiovascular:  Positive for leg swelling.   Gastrointestinal:  Positive for diarrhea. Negative for nausea and vomiting.       Acid reflux symptoms.   Genitourinary:  Positive for difficulty urinating. Negative for hematuria.       [facility]  Musculoskeletal:  Positive for arthralgias and gait problem.       S/p left hip ORIF, ORIF of the left patella.   Skin:  Negative for color change.       BLE discoloration, chronic venous insufficiency skin changes   Neurological:  Negative for seizures, speech difficulty, weakness and headaches.       Memory lapses. Hx of seizures. RLS  Psychiatric/Behavioral:  Negative for behavioral problems and sleep disturbance. The patient is not nervous/anxious.     Immunization History  Administered Date(s) Administered   Influenza Split 01/06/2014, 01/15/2017, 01/08/2018, 12/09/2018   Influenza Whole 01/07/2012, 01/06/2013   Influenza, High Dose Seasonal PF 12/25/2015, 01/20/2017   Influenza,inj,Quad PF,6+ Mos 12/21/2014   Influenza-Unspecified 12/09/2018, 01/18/2020, 01/24/2021, 01/30/2022   Moderna SARS-COV2 Booster Vaccination 04/02/2021   Moderna Sars-Covid-2 Vaccination 04/12/2019, 06/05/2019, 02/15/2020, 09/05/2020   PFIZER(Purple Top)SARS-COV-2 Vaccination 12/26/2020   PPD Test 02/16/2022   Pneumococcal Conjugate-13 02/01/2014   Pneumococcal Polysaccharide-23 12/20/1992, 01/15/2000, 04/08/2004, 06/08/2004   Td 04/08/2002, 04/22/2002   Tdap 04/09/2011, 08/17/2015   Zoster Recombinat (Shingrix) 05/29/2005, 08/27/2017   Zoster, Live 04/08/2008, 05/29/2014, 05/28/2017   Zoster, Unspecified 05/29/2005   Pertinent  Health Maintenance Due  Topic Date Due   INFLUENZA VACCINE  Completed   DEXA SCAN  Completed   MAMMOGRAM  Discontinued      02/15/2022    3:00 PM 02/15/2022    9:00 PM 02/16/2022    8:00 AM 03/06/2022    7:25 AM 05/20/2022    3:42 PM  Fall Risk  Falls in the past year?     0  Was there an injury with Fall?     0  Fall Risk Category Calculator     0  (RETIRED) Patient Fall  Risk Level Low fall risk Moderate fall risk Moderate fall risk High fall risk   Patient at Risk for Falls Due to     No Fall Risks  Fall risk Follow up     Falls evaluation completed   Functional Status Survey:    Vitals:   05/20/22 1541  BP: (!) 142/92  Pulse: 70  Resp: 17  Temp: 97.9 F (36.6 C)  SpO2: 93%  Weight: 133 lb 14.4 oz (60.7 kg)  Height: 5' 3"$  (1.6 m)   Body mass index is 23.72 kg/m. Physical Exam Vitals and nursing note reviewed.  Constitutional:      Appearance: Normal appearance.  HENT:     Head: Normocephalic and atraumatic.     Nose: Nose normal.     Mouth/Throat:     Mouth: Mucous membranes are moist.  Eyes:     Extraocular Movements: Extraocular movements intact.     Conjunctiva/sclera: Conjunctivae normal.     Right eye: Right conjunctiva is not injected.     Left eye: Left conjunctiva is not injected.     Pupils: Pupils are equal, round, and reactive to light.  Neck:     Comments: Left adam's apple bony aspect, sometimes feels soreness on palpation is chronic, no noted lymph nodes  Cardiovascular:     Rate and Rhythm: Normal rate and regular rhythm.     Heart sounds: Murmur heard.     Comments: PD pulses are not felt from previous examination.  Pulmonary:     Effort: Pulmonary effort is normal.     Breath sounds: No rales.     Comments: Decreased air entry to both lungs.  Abdominal:     General: Bowel sounds are normal.  Palpations: Abdomen is soft.     Tenderness: There is no abdominal tenderness. There is no right CVA tenderness, left CVA tenderness, guarding or rebound.     Comments: Mid abd surgical scar. SPC  Genitourinary:    Comments: SPC, tea colored urine Musculoskeletal:     Cervical back: Normal range of motion and neck supple.     Right lower leg: No edema.     Left lower leg: No edema.     Comments: Decreased overhead ROM of the left shoulder. Left knee s/p ORIF of the patella fx. Trace edema BLE  Skin:    General: Skin  is warm and dry.     Comments: BLE discoloration, chronic venous insufficiency skin changes, lateral left lower leg slow healing previous ruptured hematoma. A quarter sized red, warmth, raised, fluid filled area on the previous left knee surgical incision-resolved   Neurological:     General: No focal deficit present.     Mental Status: She is alert. Mental status is at baseline.     Gait: Gait abnormal.  Psychiatric:        Mood and Affect: Mood normal.        Behavior: Behavior normal.     Labs reviewed: Recent Labs    09/02/21 0359 09/04/21 1308 02/13/22 0543 02/14/22 0425 02/15/22 0427 02/16/22 0904 02/21/22 0000  NA 138   < > 141 142 141 139 142  K 3.2*   < > 3.8 3.1* 3.2* 3.4* 3.3*  CL 104   < > 112* 117* 115* 111 108  CO2 27   < > 22 18* 21* 24 27*  GLUCOSE 98   < > 97 87 100* 100*  --   BUN 21   < > 36* 34* 21 15 11  $ CREATININE 0.78   < > 0.87 0.70 0.52 0.60 0.6  CALCIUM 8.5*   < > 8.3* 7.9* 8.2* 8.2* 8.1*  MG 2.2  --  2.3 2.1  --   --   --    < > = values in this interval not displayed.   Recent Labs    11/02/21 0000 11/04/21 1047 12/11/21 0000  AST 19 20 12*  ALT 11 12 8  $ ALKPHOS 160* 157* 137*  BILITOT  --  0.5  --   PROT  --  7.0  --   ALBUMIN 3.7 3.6 3.0*   Recent Labs    11/04/21 1047 12/11/21 0000 02/12/22 1831 02/14/22 0425 02/15/22 0427 02/16/22 0904 02/21/22 0000  WBC 4.7 4.0   < > 11.7* 9.3 9.7 6.3  NEUTROABS 3.2 2,352.00  --   --   --   --  4,082.00  HGB 12.9 11.7*   < > 9.9* 10.4* 10.9* 10.2*  HCT 41.6 37   < > 32.6* 34.2* 37.0 32*  MCV 83.7  --    < > 85.1 84.7 86.9  --   PLT 252 179   < > 218 231 282 329   < > = values in this interval not displayed.   Lab Results  Component Value Date   TSH 1.198 02/15/2022   No results found for: "HGBA1C" Lab Results  Component Value Date   CHOL 147 09/27/2021   HDL 48 09/27/2021   LDLCALC 78 09/27/2021   TRIG 131 09/27/2021   CHOLHDL 3.6 06/27/2015    Significant Diagnostic Results  in last 30 days:  No results found.  Assessment/Plan Lower obstructive uropathy 02/12/22 Obstructive nephritis, underwent cystoscopy with right  retrograde pyelogram, right ureteral stent placed, f/u Urology Dr. Tresa Moore, 03/06/22 cystoscopy with retrograde pyelogram, ureteroscopy and stent replacement, SPC exchange.  Hx of   Ureteral stone with hydronephrosis per CT abd  05/13/22 Urology: R ureteral stone, sp R ureteroscopy stone free 03/2022, stent. SP  urethral stricture    Hypokalemia  K 3.2 05/07/22, continue Kcl  Anemia, chronic disease Hgb 11.4 05/07/22  SVT (supraventricular tachycardia) Heart rate is in control,  takes Coreg  Adult failure to thrive Gradual weight loss, under Hospice service.   Restless legs syndrome (RLS)  stable, taking MiraPex.   COPD (chronic obstructive pulmonary disease) ([place])  on Atrovent, Xopenx     Family/ staff Communication: plan of care reviewed with the patient and charge nurse.   Labs/tests ordered:  none  Time spend 35 minutes.

## 2022-06-11 ENCOUNTER — Non-Acute Institutional Stay (SKILLED_NURSING_FACILITY): Admitting: Family Medicine

## 2022-06-11 DIAGNOSIS — I10 Essential (primary) hypertension: Secondary | ICD-10-CM

## 2022-06-11 DIAGNOSIS — R627 Adult failure to thrive: Secondary | ICD-10-CM | POA: Diagnosis not present

## 2022-06-11 DIAGNOSIS — G2581 Restless legs syndrome: Secondary | ICD-10-CM | POA: Diagnosis not present

## 2022-06-11 DIAGNOSIS — R4189 Other symptoms and signs involving cognitive functions and awareness: Secondary | ICD-10-CM

## 2022-06-11 DIAGNOSIS — R269 Unspecified abnormalities of gait and mobility: Secondary | ICD-10-CM | POA: Diagnosis not present

## 2022-06-11 DIAGNOSIS — R4689 Other symptoms and signs involving appearance and behavior: Secondary | ICD-10-CM

## 2022-06-11 DIAGNOSIS — F323 Major depressive disorder, single episode, severe with psychotic features: Secondary | ICD-10-CM

## 2022-06-11 DIAGNOSIS — J431 Panlobular emphysema: Secondary | ICD-10-CM | POA: Diagnosis not present

## 2022-06-11 NOTE — Addendum Note (Signed)
Addended by: Wardell Honour on: 06/11/2022 02:42 PM   Modules accepted: Level of Service

## 2022-06-11 NOTE — Progress Notes (Signed)
Provider:  Alain Honey, MD Location:      Place of Service:     PCP: Mast, Man X, NP Patient Care Team: Mast, Man X, NP as PCP - General (Internal Medicine) [name], MD as Consulting Physician (Ophthalmology) [name], MD as Consulting Physician (Cardiology) [name], MD as Consulting Physician (Dermatology) [name], MD as Consulting Physician (Orthopedic Surgery) [name], MD as Consulting Physician (Orthopedic Surgery) [name], MD as Consulting Physician (Orthopedic Surgery) [place], [place], Man X, NP as Nurse Practitioner (Nurse Practitioner) [name], MD (Inactive) as Consulting Physician (Neurology)  Extended Emergency Contact Information Primary Emergency Contact: [name],[name] Address: [address]          [place], [place] [address] [name] of [place] Phone: [phone] Relation: Sister Secondary Emergency Contact: [name] [place] of [place] Phone: [phone] Mobile Phone: [phone] Relation: Sister  Code Status:  Goals of Care: Advanced Directive information    05/20/2022    3:42 PM  Advanced Directives  Does Patient Have a Medical Advance Directive? Yes  Type of Advance Directive Out of facility DNR (pink MOST or yellow form)  Does patient want to make changes to medical advance directive? No - Patient declined  Pre-existing out of facility DNR order (yellow form or pink MOST form) Pink MOST/Yellow Form most recent copy in chart - Physician notified to receive inpatient order      No chief complaint on file.   HPI: Patient is a 89 y.o. female seen today for medical management of chronic problems including depression, restless leg syndrome, COPD, and cognitive and behavioral changes.  [name] is followed by hospice for failure to thrive.  Her weight however after declining from a high of 162 down to 133 is now back up to 141.  She seems to be doing  well and asked me today could she be discharged from hospice so she could pursue physical therapy.  She is concerned that her legs are getting weaker and she is less able to walk. She was hospitalized  in July 2023 with hallucinations and paranoia.  She was sent to the emergency room after refusing meds.  Medical issues and included urinary retention and urology inserted a suprapubic catheter.  She was seen in consultation by psychiatry who did not think she needed admission.  Continued on Lexapro 10 mg as well as Depakote 125 mg twice daily, and Risperdal 0.25 mg twice daily.  She now tells me she thinks that she is in a good place.  Although she does not relate to other residents here she has lots of company from the outside.  Past Medical History:  Diagnosis Date   Acute bronchitis 05/23/2011   Acute upper respiratory infections of unspecified site 05/23/2011   Arthritis    Chronic airway obstruction, not elsewhere classified 05/23/2011   Coronary artery disease    Disturbance of salivary secretion 01/31/2011   Dizziness and giddiness 01/31/2011   Dyspnea    Essential tremor 04/25/2014   External hemorrhoids without mention of complication [id number]   Gait disorder 04/25/2014   GERD (gastroesophageal reflux disease)    History of kidney stones    Insomnia, unspecified 09/12/2011   Lumbago 01/31/2011   Major depressive disorder, single episode, unspecified 01/31/2011   Memory disorder 04/25/2014   Mitral valve disorders(424.0) 01/31/2011   Other and unspecified hyperlipidemia 01/31/2011   Other convulsions 01/31/2011   Other  emphysema ([place]) 01/31/2011   Pain in joint, site unspecified 01/31/2011   Restless legs syndrome (RLS) 09/12/2011   Retinal detachment with retinal defect of right eye 2011   right eye twice   Seizure disorder ([place])    Senile osteoporosis 01/31/2011   Spontaneous ecchymoses 01/31/2011   Squamous cell carcinoma of leg    Stiffness of joints, not elsewhere  classified, multiple sites 01/31/2011   Unspecified essential hypertension 01/31/2011   Past Surgical History:  Procedure Laterality Date   ABDOMINAL HYSTERECTOMY  06/21/2003   TAH/BSO, omenectomy PSB resect, Stg IC cystadenofibroma   CHOLECYSTECTOMY  2005   Dr. Marlou Starks   CYSTOSCOPY W/ URETERAL STENT PLACEMENT Right 02/13/2022   Procedure: CYSTOSCOPY WITH RETROGRADE PYELOGRAM/URETERAL STENT PLACEMENT, AND SUPRAPUBIC TUBE EXCHANGE;  Surgeon: Alexis Frock, MD;  Location: [facility];  Service: Urology;  Laterality: Right;   CYSTOSCOPY WITH RETROGRADE PYELOGRAM, URETEROSCOPY AND STENT PLACEMENT Right 03/06/2022   Procedure: CYSTOSCOPY WITH RETROGRADE PYELOGRAM, URETEROSCOPY AND STENT REPLACEMENT, SUPRAPUBIC CATHETER EXCHANGE;  Surgeon: Alexis Frock, MD;  Location: [facility];  Service: Urology;  Laterality: Right;   ELBOW SURGERY Right 2008   broken   Dr. Apolonio Schneiders   EYE SURGERY     HOLMIUM LASER APPLICATION Right [id number]   Procedure: HOLMIUM LASER APPLICATION;  Surgeon: Alexis Frock, MD;  Location: [facility];  Service: Urology;  Laterality: Right;   INTRAMEDULLARY (IM) NAIL INTERTROCHANTERIC Left 08/30/2021   Procedure: INTRAMEDULLARY (IM) NAIL INTERTROCHANTRIC;  Surgeon: Rod Can, MD;  Location: [facility];  Service: Orthopedics;  Laterality: Left;   ORIF PATELLA Left 05/02/2020   Procedure: OPEN REDUCTION INTERNAL (ORIF) FIXATION LEFT PATELLA WITH MEDIAL AND LATERAL LIGAMENT REINFORCEMENTS;  Surgeon: [name], MD;  Location: [facility];  Service: Orthopedics;  Laterality: Left;   ORIF PATELLA Left 05/30/2020   Procedure: OPEN REDUCTION INTERNAL (ORIF) FIXATION PATELLA;  Surgeon: [name], MD;  Location: [facility];  Service: Orthopedics;  Laterality: Left;   RETINAL DETACHMENT SURGERY N/A    two   REVERSE SHOULDER ARTHROPLASTY Left 05/06/2019   Procedure: REVERSE SHOULDER ARTHROPLASTY;  Surgeon: Justice Britain, MD;  Location: [facility];  Service: Orthopedics;  Laterality: Left;  150mn    ROTATOR CUFF REPAIR Right 2012   Dr. CTheda Sers  SQUAMOUS CELL CARCINOMA EXCISION Bilateral 2012, 8/14   [place] on legs   Dr. GSarajane Jews  TONSILLECTOMY  1941   VIDEO BRONCHOSCOPY WITH ENDOBRONCHIAL NAVIGATION N/A 11/29/2015   Procedure: VIDEO BRONCHOSCOPY WITH ENDOBRONCHIAL NAVIGATION;  Surgeon: RCollene Gobble MD;  [facility]  Service: Thoracic;  Laterality: N/A;    reports that she quit smoking about 34 years ago. Her smoking use included cigarettes. She has a 40.00 pack-year smoking history. She has never used smokeless tobacco. She reports that she does not drink alcohol and does not use drugs. 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  Tobacco Use   Smoking status: Former    Packs/day: 1.00    Years: 40.00    Total pack years: 40.00    Types: Cigarettes    Quit date: 04/08/1988    Years since quitting: 34.1   Smokeless tobacco: Never   Tobacco comments:    quit in [place]  Use: Never used  Substance and Sexual Activity   Alcohol use: No    Alcohol/week: 0.0 standard drinks of alcohol   Drug use: No   Sexual activity: Never  Other Topics Concern   Not on file  Social History Narrative   Lives at [facility]   No children   Divorced   Exercise climbs steps 4 flights daily PT for balance   Alcohol none   Stopped smoking 1989   Patient drinks 3 large sodas daily.   Patient is right handed.   Social Determinants of Health   Financial Resource Strain: Not on file  Food Insecurity: No Food Insecurity (02/12/2022)   Hunger Vital Sign    Worried About Running Out of Food in the Last Year: Never true    Ran Out of Food in the Last Year: Never true  Transportation Needs: No Transportation Needs (02/12/2022)   PRAPARE -  Hydrologist (Medical): No    Lack of Transportation (Non-Medical): No  Physical Activity: Not on file  Stress: Not on file  Social Connections: Not on file  Intimate Partner Violence: Not At Risk (02/12/2022)   Humiliation, Afraid, Rape, and Kick questionnaire    Fear of Current or Ex-Partner: No    Emotionally Abused: No    Physically Abused: No    Sexually Abused: No    Functional Status Survey:    Family History  Problem Relation Age of Onset   Heart disease Father        CHF   Cancer Mother        breast   Seizures Sister     Health Maintenance  Topic Date Due   DTaP/Tdap/Td (5 - Td or Tdap) 08/16/2025   Pneumonia Vaccine 31+ Years old  Completed   INFLUENZA VACCINE  Completed   DEXA SCAN  Completed   Zoster Vaccines- Shingrix  Completed   HPV VACCINES  Aged Out   MAMMOGRAM  Discontinued   COVID-19 Vaccine  Discontinued    Allergies  Allergen Reactions   Vioxx [Rofecoxib] Shortness Of Breath   Dyazide [Hydrochlorothiazide W-Triamterene] Other (See Comments)    Lowers blood pressure too much   Latex Swelling and Other (See Comments)    "Allergic," per [facility]   Sulfa Antibiotics Nausea And Vomiting and Other (See Comments)    "Allergic," per [facility]   Sumycin [Tetracycline] Other (See Comments)    Can't take due to drug reaction- "Allergic," per [facility]    Outpatient Encounter Medications as of 06/11/2022  Medication Sig   acetaminophen (TYLENOL) 325 MG tablet Take 650 mg by mouth every 4 (four) hours as needed for fever or mild pain. Give 2 tablet by mouth every 6 hours as needed for Pain   albuterol (ACCUNEB) 1.25 MG/3ML nebulizer solution Take 1 ampule by nebulization every 6 (six) hours as needed for shortness of breath.   Albuterol Sulfate (PROAIR RESPICLICK) 123XX123 (90 Base) MCG/ACT AEPB Inhale 2 puffs into the lungs daily as needed (shortness of breath or wheezing).   carvedilol (COREG) 3.125 MG tablet Take 1 tablet (3.125 mg total) by  mouth 2 (two) times daily with a meal.   fluticasone (VERAMYST) 27.5 MCG/SPRAY nasal spray Place 1 spray into the nose daily.   hydrocortisone 2.5 % lotion Apply 1 application  topically 2 (two) times daily as needed (face and ears as need for psoriasis).   hydrocortisone cream 1 % Apply 1 application  topically every 12 (twelve) hours as  needed for itching (dry skin related to psoriasis).   ipratropium (ATROVENT) 0.02 % nebulizer solution Take 2.5 mLs (0.5 mg total) by nebulization 3 (three) times daily for 3 days, THEN 2.5 mLs (0.5 mg total) every 6 (six) hours as needed for up to 3 days. (Patient not taking: Reported on 02/27/2022)   ketoconazole (NIZORAL) 2 % cream Apply 1 application  topically daily as needed (psoriasis).   ketoconazole (NIZORAL) 2 % shampoo Apply 1 application  topically See admin instructions. Apply topically to scalp on Mondays and Thursdays for hair care   levalbuterol (XOPENEX) 0.63 MG/3ML nebulizer solution Take 3 mLs (0.63 mg total) by nebulization 3 (three) times daily for 3 days, THEN 3 mLs (0.63 mg total) every 6 (six) hours as needed for up to 3 days. (Patient not taking: Reported on 02/27/2022)   nystatin (MYCOSTATIN/NYSTOP) powder Apply 1 application  topically every 12 (twelve) hours as needed (urogenital area).   ondansetron (ZOFRAN) 4 MG tablet Take 1 tablet (4 mg total) by mouth every 6 (six) hours as needed for nausea.   oxybutynin (DITROPAN) 5 MG tablet Take 1 tablet (5 mg total) by mouth 3 (three) times daily.   pramipexole (MIRAPEX) 0.5 MG tablet Take 0.5 mg by mouth at bedtime.   traMADol (ULTRAM) 50 MG tablet Take 1 tablet (50 mg total) by mouth every 8 (eight) hours as needed. For post-op pain   triamcinolone (KENALOG) 0.1 % Apply 1 application. topically daily as needed (psoriasis).   No facility-administered encounter medications on file as of 06/11/2022.    Review of Systems  Constitutional: Negative.   HENT: Negative.    Respiratory: Negative.     Cardiovascular: Negative.   Gastrointestinal: Negative.   Genitourinary:  Positive for difficulty urinating.  Musculoskeletal:  Positive for gait problem.  Neurological:  Positive for tremors.  Psychiatric/Behavioral:  Positive for hallucinations.   All other systems reviewed and are negative.   There were no vitals filed for this visit. There is no height or weight on file to calculate BMI. Physical Exam Vitals and nursing note reviewed.  Constitutional:      Appearance: Normal appearance.  Eyes:     Extraocular Movements: Extraocular movements intact.     Pupils: Pupils are equal, round, and reactive to light.  Cardiovascular:     Rate and Rhythm: Normal rate and regular rhythm.  Pulmonary:     Effort: Pulmonary effort is normal.     Breath sounds: Normal breath sounds.  Abdominal:     General: Bowel sounds are normal.     Palpations: Abdomen is soft.  Neurological:     General: No focal deficit present.     Mental Status: She is alert and oriented to person, place, and time.  Psychiatric:        Behavior: Behavior normal.     Labs reviewed: Basic Metabolic Panel: Recent Labs    09/02/21 0359 09/04/21 1308 02/13/22 0543 02/14/22 0425 02/15/22 0427 02/16/22 0904 02/21/22 0000  NA 138   < > 141 142 141 139 142  K 3.2*   < > 3.8 3.1* 3.2* 3.4* 3.3*  CL 104   < > 112* 117* 115* 111 108  CO2 27   < > 22 18* 21* 24 27*  GLUCOSE 98   < > 97 87 100* 100*  --   BUN 21   < > 36* 34* '21 15 11  '$ CREATININE 0.78   < > 0.87 0.70 0.52 0.60 0.6  CALCIUM 8.5*   < >  8.3* 7.9* 8.2* 8.2* 8.1*  MG 2.2  --  2.3 2.1  --   --   --    < > = values in this interval not displayed.   Liver Function Tests: Recent Labs    11/02/21 0000 11/04/21 1047 12/11/21 0000  AST 19 20 12*  ALT '11 12 8  '$ ALKPHOS 160* 157* 137*  BILITOT  --  0.5  --   PROT  --  7.0  --   ALBUMIN 3.7 3.6 3.0*   No results for input(s): "LIPASE", "AMYLASE" in the last 8760 hours. No results for input(s):  "AMMONIA" in the last 8760 hours. CBC: Recent Labs    11/04/21 1047 12/11/21 0000 02/12/22 1831 02/14/22 0425 02/15/22 0427 02/16/22 0904 02/21/22 0000  WBC 4.7 4.0   < > 11.7* 9.3 9.7 6.3  NEUTROABS 3.2 2,352.00  --   --   --   --  4,082.00  HGB 12.9 11.7*   < > 9.9* 10.4* 10.9* 10.2*  HCT 41.6 37   < > 32.6* 34.2* 37.0 32*  MCV 83.7  --    < > 85.1 84.7 86.9  --   PLT 252 179   < > 218 231 282 329   < > = values in this interval not displayed.   Cardiac Enzymes: No results for input(s): "CKTOTAL", "CKMB", "CKMBINDEX", "TROPONINI" in the last 8760 hours. BNP: Invalid input(s): "POCBNP" No results found for: "HGBA1C" Lab Results  Component Value Date   TSH 1.198 02/15/2022   Lab Results  Component Value Date   VITAMINB12 249 09/05/2021   Lab Results  Component Value Date   FOLATE 23.1 09/05/2021   Lab Results  Component Value Date   IRON 64 09/05/2021   TIBC 369 09/05/2021   FERRITIN 31 09/05/2021    Imaging and Procedures obtained prior to SNF admission: DG C-Arm 1-60 Min-No Report  Result Date: 03/06/2022 Fluoroscopy was utilized by the requesting physician.  No radiographic interpretation.   DG C-Arm 1-60 Min-No Report  Result Date: 03/06/2022 Fluoroscopy was utilized by the requesting physician.  No radiographic interpretation.    Assessment/Plan 1. Adult failure to thrive Appetite is good weight seems to have hit at low point and is increasing now.  2. Cognitive and behavioral changes Cognitive issues may have been related to medical problems, the urinary retention and depression.  Since she is doing better with both those, although she has problem remembering names I did not see any significant cognitive problems given her age  22. Panlobular emphysema ([place]) She uses albuterol nebulizer.  No complaints with shortness of breath  4. Depression, psychotic ([place]) On no meds currently; doing much better  5. Essential hypertension Takes carvedilol  blood pressure today 141/90  6. Gait disorder Uses wheelchair for ambulation.  Interested in getting back into physical therapy to help her gait  7. Restless legs syndrome (RLS) Patient takes Mirapex this is effective.    Family/ staff Communication:   Labs/tests ordered:  .smmsig

## 2022-06-18 ENCOUNTER — Other Ambulatory Visit: Payer: Self-pay | Admitting: Nurse Practitioner

## 2022-06-24 DIAGNOSIS — F411 Generalized anxiety disorder: Secondary | ICD-10-CM | POA: Diagnosis not present

## 2022-06-24 DIAGNOSIS — F331 Major depressive disorder, recurrent, moderate: Secondary | ICD-10-CM | POA: Diagnosis not present

## 2022-07-08 ENCOUNTER — Encounter: Payer: Self-pay | Admitting: Nurse Practitioner

## 2022-07-08 ENCOUNTER — Non-Acute Institutional Stay (SKILLED_NURSING_FACILITY): Payer: Medicare PPO | Admitting: Nurse Practitioner

## 2022-07-08 DIAGNOSIS — G2581 Restless legs syndrome: Secondary | ICD-10-CM

## 2022-07-08 DIAGNOSIS — E7800 Pure hypercholesterolemia, unspecified: Secondary | ICD-10-CM | POA: Insufficient documentation

## 2022-07-08 DIAGNOSIS — Z86010 Personal history of colonic polyps: Secondary | ICD-10-CM | POA: Insufficient documentation

## 2022-07-08 DIAGNOSIS — M2040 Other hammer toe(s) (acquired), unspecified foot: Secondary | ICD-10-CM | POA: Insufficient documentation

## 2022-07-08 DIAGNOSIS — Z9359 Other cystostomy status: Secondary | ICD-10-CM

## 2022-07-08 DIAGNOSIS — F331 Major depressive disorder, recurrent, moderate: Secondary | ICD-10-CM

## 2022-07-08 DIAGNOSIS — F411 Generalized anxiety disorder: Secondary | ICD-10-CM | POA: Diagnosis not present

## 2022-07-08 DIAGNOSIS — R569 Unspecified convulsions: Secondary | ICD-10-CM

## 2022-07-08 DIAGNOSIS — G479 Sleep disorder, unspecified: Secondary | ICD-10-CM | POA: Insufficient documentation

## 2022-07-08 DIAGNOSIS — M419 Scoliosis, unspecified: Secondary | ICD-10-CM | POA: Insufficient documentation

## 2022-07-08 DIAGNOSIS — E876 Hypokalemia: Secondary | ICD-10-CM

## 2022-07-08 DIAGNOSIS — R413 Other amnesia: Secondary | ICD-10-CM

## 2022-07-08 DIAGNOSIS — R627 Adult failure to thrive: Secondary | ICD-10-CM

## 2022-07-08 DIAGNOSIS — E040 Nontoxic diffuse goiter: Secondary | ICD-10-CM | POA: Insufficient documentation

## 2022-07-08 DIAGNOSIS — I471 Supraventricular tachycardia, unspecified: Secondary | ICD-10-CM

## 2022-07-08 DIAGNOSIS — Z8543 Personal history of malignant neoplasm of ovary: Secondary | ICD-10-CM | POA: Insufficient documentation

## 2022-07-08 DIAGNOSIS — Z8589 Personal history of malignant neoplasm of other organs and systems: Secondary | ICD-10-CM | POA: Insufficient documentation

## 2022-07-08 DIAGNOSIS — R609 Edema, unspecified: Secondary | ICD-10-CM

## 2022-07-08 DIAGNOSIS — J431 Panlobular emphysema: Secondary | ICD-10-CM | POA: Diagnosis not present

## 2022-07-08 NOTE — Assessment & Plan Note (Signed)
f/u Urology, off Uribel, on Oxybutynin.

## 2022-07-08 NOTE — Assessment & Plan Note (Signed)
no active seizures, off meds.  

## 2022-07-08 NOTE — Progress Notes (Signed)
Location:  [facility] Room Number: 29A Place of Service:  SNF (31) Provider:  Amandajo Gonder X, NP   Davidson Palmieri X, NP  Patient Care Team: Green Quincy X, NP as PCP - General (Internal Medicine) [name], MD as Consulting Physician (Ophthalmology) [name], MD as Consulting Physician (Cardiology) [name], MD as Consulting Physician (Dermatology) [name], MD as Consulting Physician (Orthopedic Surgery) [name], MD as Consulting Physician (Orthopedic Surgery) [name], MD as Consulting Physician (Orthopedic Surgery) [name], [place], Tegh Franek X, NP as Nurse Practitioner (Nurse Practitioner) [name], MD (Inactive) as Consulting Physician (Neurology)  Extended Emergency Contact Information Primary Emergency Contact: [name],[name] Address: [address]          [place], [place] [address] [name] of [place] Phone: [phone] Relation: Sister Secondary Emergency Contact: [name] [place] of [place] Phone: [phone] Mobile Phone: [phone] Relation: Sister  Code Status:  DNR Goals of care: Advanced Directive information    05/20/2022    3:42 PM  Advanced Directives  Does Patient Have a Medical Advance Directive? Yes  Type of Advance Directive Out of facility DNR (pink MOST or yellow form)  Does patient want to make changes to medical advance directive? No - Patient declined  Pre-existing out of facility DNR order (yellow form or pink MOST form) Pink MOST/Yellow Form most recent copy in chart - Physician notified to receive inpatient order     Chief Complaint  Patient presents with  . Medical Management of Chronic Issues    Routine Visit     HPI:  Pt is a 89 y.o. female seen today for medical management of chronic diseases.      Resolved loose stools, nausea, vomiting, or abd pain after liquid diet, 05/03/22 abd [place] no renal or gallbladder stone or  hydronephrosis. 05/02/22 Xray abd may represent ileus                02/12/22 Obstructive nephritis, underwent cystoscopy with right retrograde pyelogram, right ureteral stent placed, f/u Urology Dr. Tresa Moore, 03/06/22 cystoscopy with retrograde pyelogram, ureteroscopy and stent replacement, SPC exchange.              05/13/22 Urology: R ureteral stone, sp R ureteroscopy stone free 03/2022, stent. SP  urethral stricture, on Oxybutynin.              Ureteral stone with hydronephrosis per CT abd             Hypokalemia, K 3.2 05/07/22, placed on Kcl             Anemia, Hgb 11.4 05/07/22             SVT, takes Coreg             BLE edema, not apparent, off Furosemide.              Adult failure to thrive, under Hospice service.              Suprapubic Catheter, f/u Urology, off Uribel, on Oxybutynin.              Depression, stable, on Lorazepam             Dementia, under hospice service             Restless leg syndrome, stable, taking MiraPex.  Hx of seizures, no active seizures, off meds.              COPD, on ProAir, Albuterol neb.      Past Medical History:  Diagnosis Date  . Acute bronchitis 05/23/2011  . Acute upper respiratory infections of unspecified site 05/23/2011  . Arthritis   . Chronic airway obstruction, not elsewhere classified 05/23/2011  . Coronary artery disease   . Disturbance of salivary secretion 01/31/2011  . Dizziness and giddiness 01/31/2011  . Dyspnea   . Essential tremor 04/25/2014  . External hemorrhoids without mention of complication [id number]  . Gait disorder 04/25/2014  . GERD (gastroesophageal reflux disease)   . History of kidney stones   . Insomnia, unspecified 09/12/2011  . Lumbago 01/31/2011  . Major depressive disorder, single episode, unspecified 01/31/2011  . Memory disorder 04/25/2014  . Mitral valve disorders(424.0) 01/31/2011  . Other and unspecified hyperlipidemia 01/31/2011  . Other convulsions 01/31/2011  . Other emphysema  01/31/2011  . Pain in joint, site unspecified 01/31/2011  . Restless legs syndrome (RLS) 09/12/2011  . Retinal detachment with retinal defect of right eye 2011   right eye twice  . Seizure disorder   . Senile osteoporosis 01/31/2011  . Spontaneous ecchymoses 01/31/2011  . Squamous cell carcinoma of leg   . Stiffness of joints, not elsewhere classified, multiple sites 01/31/2011  . Unspecified essential hypertension 01/31/2011   Past Surgical History:  Procedure Laterality Date  . ABDOMINAL HYSTERECTOMY  06/21/2003   TAH/BSO, omenectomy PSB resect, Stg IC cystadenofibroma  . CHOLECYSTECTOMY  2005   Dr. Marlou Starks  . CYSTOSCOPY W/ URETERAL STENT PLACEMENT Right 02/13/2022   Procedure: CYSTOSCOPY WITH RETROGRADE PYELOGRAM/URETERAL STENT PLACEMENT, AND SUPRAPUBIC TUBE EXCHANGE;  Surgeon: Alexis Frock, MD;  Location: [facility];  Service: Urology;  Laterality: Right;  . CYSTOSCOPY WITH RETROGRADE PYELOGRAM, URETEROSCOPY AND STENT PLACEMENT Right 03/06/2022   Procedure: CYSTOSCOPY WITH RETROGRADE PYELOGRAM, URETEROSCOPY AND STENT REPLACEMENT, SUPRAPUBIC CATHETER EXCHANGE;  Surgeon: Alexis Frock, MD;  Location: [facility];  Service: Urology;  Laterality: Right;  . ELBOW SURGERY Right 2008   broken   Dr. Apolonio Schneiders  . EYE SURGERY    . HOLMIUM LASER APPLICATION Right [id number]   Procedure: HOLMIUM LASER APPLICATION;  Surgeon: Alexis Frock, MD;  Location: [facility];  Service: Urology;  Laterality: Right;  . INTRAMEDULLARY (IM) NAIL INTERTROCHANTERIC Left 08/30/2021   Procedure: INTRAMEDULLARY (IM) NAIL INTERTROCHANTRIC;  Surgeon: Rod Can, MD;  Location: [facility];  Service: Orthopedics;  Laterality: Left;  . ORIF PATELLA Left 05/02/2020   Procedure: OPEN REDUCTION INTERNAL (ORIF) FIXATION LEFT PATELLA WITH MEDIAL AND LATERAL LIGAMENT REINFORCEMENTS;  Surgeon: [name], MD;  Location: [facility];  Service: Orthopedics;  Laterality: Left;  . ORIF PATELLA Left 05/30/2020   Procedure: OPEN REDUCTION  INTERNAL (ORIF) FIXATION PATELLA;  Surgeon: [name], MD;  Location: [facility];  Service: Orthopedics;  Laterality: Left;  . RETINAL DETACHMENT SURGERY N/A    two  . REVERSE SHOULDER ARTHROPLASTY Left 05/06/2019   Procedure: REVERSE SHOULDER ARTHROPLASTY;  Surgeon: Justice Britain, MD;  Location: [facility];  Service: Orthopedics;  Laterality: Left;  129min  . ROTATOR CUFF REPAIR Right 2012   Dr. Theda Sers  . SQUAMOUS CELL CARCINOMA EXCISION Bilateral 2012, 8/14   [place] on legs   Dr. Sarajane Jews  . TONSILLECTOMY  1941  . VIDEO BRONCHOSCOPY WITH ENDOBRONCHIAL NAVIGATION N/A 11/29/2015   Procedure: VIDEO BRONCHOSCOPY WITH ENDOBRONCHIAL NAVIGATION;  Surgeon: Collene Gobble, MD;  Location: [facility];  Service:  Thoracic;  Laterality: N/A;    Allergies  Allergen Reactions  . Vioxx [Rofecoxib] Shortness Of Breath  . Dyazide [Hydrochlorothiazide W-Triamterene] Other (See Comments)    Lowers blood pressure too much  . Latex Swelling and Other (See Comments)    "Allergic," per [facility]  . Sulfa Antibiotics Nausea And Vomiting and Other (See Comments)    "Allergic," per MAR  . Sumycin [Tetracycline] Other (See Comments)    Can't take due to drug reaction- "Allergic," per [facility]    Outpatient Encounter Medications as of 07/08/2022  Medication Sig  . acetaminophen (TYLENOL) 325 MG tablet Take 650 mg by mouth every 4 (four) hours as needed for fever or mild pain. Give 2 tablet by mouth every 6 hours as needed for Pain  . albuterol (ACCUNEB) 1.25 MG/3ML nebulizer solution Take 1 ampule by nebulization every 6 (six) hours as needed for shortness of breath.  . Albuterol Sulfate (PROAIR RESPICLICK) 123XX123 (90 Base) MCG/ACT AEPB Inhale 2 puffs into the lungs daily as needed (shortness of breath or wheezing).  . carvedilol (COREG) 3.125 MG tablet Take 1 tablet (3.125 mg total) by mouth 2 (two) times daily with a meal.  . fluticasone (VERAMYST) 27.5 MCG/SPRAY nasal spray Place 1 spray into the nose daily.  [name] oxybutynin  (DITROPAN) 5 MG tablet Take 1 tablet (5 mg total) by mouth 3 (three) times daily.  . potassium chloride (MICRO-K) 10 MEQ CR capsule Take 10 mEq by mouth 2 (two) times daily.  . pramipexole (MIRAPEX) 0.5 MG tablet Take 0.5 mg by mouth at bedtime.  . triamcinolone (KENALOG) 0.1 % Apply 1 application. topically daily as needed (psoriasis).  . hydrocortisone 2.5 % lotion Apply 1 application  topically 2 (two) times daily as needed (face and ears as need for psoriasis). (Patient not taking: Reported on 07/08/2022)  . hydrocortisone cream 1 % Apply 1 application  topically every 12 (twelve) hours as needed for itching (dry skin related to psoriasis). (Patient not taking: Reported on 07/08/2022)  . ipratropium (ATROVENT) 0.02 % nebulizer solution Take 2.5 mLs (0.5 mg total) by nebulization 3 (three) times daily for 3 days, THEN 2.5 mLs (0.5 mg total) every 6 (six) hours as needed for up to 3 days. (Patient not taking: Reported on 02/27/2022)  . ketoconazole (NIZORAL) 2 % cream Apply 1 application  topically daily as needed (psoriasis). (Patient not taking: Reported on 07/08/2022)  . ketoconazole (NIZORAL) 2 % shampoo Apply 1 application  topically See admin instructions. Apply topically to scalp on Mondays and Thursdays for hair care (Patient not taking: Reported on 07/08/2022)  . levalbuterol (XOPENEX) 0.63 MG/3ML nebulizer solution Take 3 mLs (0.63 mg total) by nebulization 3 (three) times daily for 3 days, THEN 3 mLs (0.63 mg total) every 6 (six) hours as needed for up to 3 days. (Patient not taking: Reported on 02/27/2022)  . nystatin (MYCOSTATIN/NYSTOP) powder Apply 1 application  topically every 12 (twelve) hours as needed (urogenital area). (Patient not taking: Reported on 07/08/2022)   No facility-administered encounter medications on file as of 07/08/2022.    Review of Systems  Constitutional:  Negative for appetite change, fatigue and fever.  HENT:  Positive for hearing loss. Negative for congestion and  trouble swallowing.   Eyes:  Negative for visual disturbance.  Respiratory:  Negative for cough.        DOE is chronic  Cardiovascular:  Positive for leg swelling.  Gastrointestinal:  Positive for diarrhea. Negative for nausea and vomiting.  Acid reflux symptoms.   Genitourinary:  Positive for difficulty urinating. Negative for hematuria.       [facility]  Musculoskeletal:  Positive for arthralgias and gait problem.       S/p left hip ORIF, ORIF of the left patella.   Skin:  Negative for color change.       BLE discoloration, chronic venous insufficiency skin changes   Neurological:  Negative for seizures, speech difficulty, weakness and headaches.       Memory lapses. Hx of seizures. RLS  Psychiatric/Behavioral:  Negative for behavioral problems and sleep disturbance. The patient is not nervous/anxious.     Immunization History  Administered Date(s) Administered  . Influenza Split 01/06/2014, 01/15/2017, 01/08/2018, 12/09/2018  . Influenza Whole 01/07/2012, 01/06/2013  . Influenza, High Dose Seasonal PF 12/25/2015, 01/20/2017  . Influenza,inj,Quad PF,6+ Mos 12/21/2014  . Influenza-Unspecified 12/09/2018, 01/18/2020, 01/24/2021, 01/30/2022  . Moderna SARS-COV2 Booster Vaccination 04/02/2021  . Moderna Sars-Covid-2 Vaccination 04/12/2019, 06/05/2019, 02/15/2020, 09/05/2020  . PFIZER(Purple Top)SARS-COV-2 Vaccination 12/26/2020  . PPD Test 02/16/2022  . Pneumococcal Conjugate-13 02/01/2014  . Pneumococcal Polysaccharide-23 12/20/1992, 01/15/2000, 04/08/2004, 06/08/2004  . Td 04/08/2002, 04/22/2002  . Tdap 04/09/2011, 08/17/2015  . Zoster Recombinat (Shingrix) 05/29/2005, 08/27/2017  . Zoster, Live 04/08/2008, 05/29/2014, 05/28/2017  . Zoster, Unspecified 05/29/2005   Pertinent  Health Maintenance Due  Topic Date Due  . INFLUENZA VACCINE  11/07/2022  . DEXA SCAN  Completed  . MAMMOGRAM  Discontinued      02/15/2022    3:00 PM 02/15/2022    9:00 PM 02/16/2022    8:00 AM  03/06/2022    7:25 AM 05/20/2022    3:42 PM  Fall Risk  Falls in the past year?     0  Was there an injury with Fall?     0  Fall Risk Category Calculator     0  (RETIRED) Patient Fall Risk Level Low fall risk Moderate fall risk Moderate fall risk High fall risk   Patient at Risk for Falls Due to     No Fall Risks  Fall risk Follow up     Falls evaluation completed   Functional Status Survey:    Vitals:   07/08/22 1445  BP: 130/86  Pulse: 84  Resp: 18  Temp: 97.6 F (36.4 C)  TempSrc: Temporal  SpO2: 95%  Weight: 144 lb 12.8 oz (65.7 kg)  Height: 5\' 3"  (1.6 m)   Body mass index is 25.65 kg/m. Physical Exam Vitals and nursing note reviewed.  Constitutional:      Appearance: Normal appearance.  HENT:     Head: Normocephalic and atraumatic.     Nose: Nose normal.     Mouth/Throat:     Mouth: Mucous membranes are moist.  Eyes:     Extraocular Movements: Extraocular movements intact.     Conjunctiva/sclera: Conjunctivae normal.     Right eye: Right conjunctiva is not injected.     Left eye: Left conjunctiva is not injected.     Pupils: Pupils are equal, round, and reactive to light.  Neck:     Comments: Left adam's apple bony aspect, sometimes feels soreness on palpation is chronic, no noted lymph nodes  Cardiovascular:     Rate and Rhythm: Normal rate and regular rhythm.     Heart sounds: Murmur heard.     Comments: PD pulses are not felt from previous examination.  Pulmonary:     Effort: Pulmonary effort is normal.     Breath sounds: No rales.  Comments: Decreased air entry to both lungs.  Abdominal:     General: Bowel sounds are normal.     Palpations: Abdomen is soft.     Tenderness: There is no abdominal tenderness. There is no right CVA tenderness, left CVA tenderness, guarding or rebound.     Comments: Mid abd surgical scar. SPC  Genitourinary:    Comments: SPC, tea colored urine Musculoskeletal:     Cervical back: Normal range of motion and neck  supple.     Right lower leg: No edema.     Left lower leg: No edema.     Comments: Decreased overhead ROM of the left shoulder. Left knee s/p ORIF of the patella fx. Trace edema BLE  Skin:    General: Skin is warm and dry.     Comments: BLE discoloration, chronic venous insufficiency skin changes, lateral left lower leg slow healing previous ruptured hematoma. A quarter sized red, warmth, raised, fluid filled area on the previous left knee surgical incision-resolved   Neurological:     General: No focal deficit present.     Mental Status: She is alert. Mental status is at baseline.     Gait: Gait abnormal.  Psychiatric:        Mood and Affect: Mood normal.        Behavior: Behavior normal.    Labs reviewed: Recent Labs    09/02/21 0359 09/04/21 1308 02/13/22 0543 02/14/22 0425 02/15/22 0427 02/16/22 0904 02/21/22 0000  NA 138   < > 141 142 141 139 142  K 3.2*   < > 3.8 3.1* 3.2* 3.4* 3.3*  CL 104   < > 112* 117* 115* 111 108  CO2 27   < > 22 18* 21* 24 27*  GLUCOSE 98   < > 97 87 100* 100*  --   BUN 21   < > 36* 34* 21 15 11   CREATININE 0.78   < > 0.87 0.70 0.52 0.60 0.6  CALCIUM 8.5*   < > 8.3* 7.9* 8.2* 8.2* 8.1*  MG 2.2  --  2.3 2.1  --   --   --    < > = values in this interval not displayed.   Recent Labs    11/02/21 0000 11/04/21 1047 12/11/21 0000  AST 19 20 12*  ALT 11 12 8   ALKPHOS 160* 157* 137*  BILITOT  --  0.5  --   PROT  --  7.0  --   ALBUMIN 3.7 3.6 3.0*   Recent Labs    11/04/21 1047 12/11/21 0000 02/12/22 1831 02/14/22 0425 02/15/22 0427 02/16/22 0904 02/21/22 0000  WBC 4.7 4.0   < > 11.7* 9.3 9.7 6.3  NEUTROABS 3.2 2,352.00  --   --   --   --  4,082.00  HGB 12.9 11.7*   < > 9.9* 10.4* 10.9* 10.2*  HCT 41.6 37   < > 32.6* 34.2* 37.0 32*  MCV 83.7  --    < > 85.1 84.7 86.9  --   PLT 252 179   < > 218 231 282 329   < > = values in this interval not displayed.   Lab Results  Component Value Date   TSH 1.198 02/15/2022   No results  found for: "HGBA1C" Lab Results  Component Value Date   CHOL 147 09/27/2021   HDL 48 09/27/2021   LDLCALC 78 09/27/2021   TRIG 131 09/27/2021   CHOLHDL 3.6 06/27/2015    Significant Diagnostic  Results in last 30 days:  No results found.  Assessment/Plan There are no diagnoses linked to this encounter.   Family/ staff Communication: ***  Labs/tests ordered:  ***

## 2022-07-08 NOTE — Assessment & Plan Note (Signed)
SNF [facility] for supportive care, under Hospice service.  

## 2022-07-08 NOTE — Assessment & Plan Note (Signed)
not apparent, off Furosemide  

## 2022-07-08 NOTE — Assessment & Plan Note (Signed)
Stable, on ProAir, Albuterol neb.

## 2022-07-08 NOTE — Assessment & Plan Note (Signed)
K 3.2 05/07/22, placed on Kcl

## 2022-07-08 NOTE — Assessment & Plan Note (Signed)
takes Coreg

## 2022-07-08 NOTE — Assessment & Plan Note (Signed)
stable, taking MiraPex.  

## 2022-07-08 NOTE — Assessment & Plan Note (Signed)
under Hospice service.  

## 2022-07-08 NOTE — Assessment & Plan Note (Signed)
stable, on Lorazepam

## 2022-07-22 DIAGNOSIS — F331 Major depressive disorder, recurrent, moderate: Secondary | ICD-10-CM | POA: Diagnosis not present

## 2022-07-22 DIAGNOSIS — F411 Generalized anxiety disorder: Secondary | ICD-10-CM | POA: Diagnosis not present

## 2022-08-02 DIAGNOSIS — F331 Major depressive disorder, recurrent, moderate: Secondary | ICD-10-CM | POA: Diagnosis not present

## 2022-08-02 DIAGNOSIS — F411 Generalized anxiety disorder: Secondary | ICD-10-CM | POA: Diagnosis not present

## 2022-08-05 DIAGNOSIS — F411 Generalized anxiety disorder: Secondary | ICD-10-CM | POA: Diagnosis not present

## 2022-08-05 DIAGNOSIS — F331 Major depressive disorder, recurrent, moderate: Secondary | ICD-10-CM | POA: Diagnosis not present

## 2022-08-08 ENCOUNTER — Non-Acute Institutional Stay (SKILLED_NURSING_FACILITY): Payer: Medicare PPO | Admitting: Nurse Practitioner

## 2022-08-08 ENCOUNTER — Encounter: Payer: Self-pay | Admitting: Nurse Practitioner

## 2022-08-08 DIAGNOSIS — K0510 Chronic gingivitis, plaque induced: Secondary | ICD-10-CM

## 2022-08-08 DIAGNOSIS — J431 Panlobular emphysema: Secondary | ICD-10-CM

## 2022-08-08 DIAGNOSIS — G2581 Restless legs syndrome: Secondary | ICD-10-CM | POA: Diagnosis not present

## 2022-08-08 DIAGNOSIS — F0390 Unspecified dementia without behavioral disturbance: Secondary | ICD-10-CM

## 2022-08-08 DIAGNOSIS — I471 Supraventricular tachycardia, unspecified: Secondary | ICD-10-CM

## 2022-08-08 DIAGNOSIS — Z9359 Other cystostomy status: Secondary | ICD-10-CM

## 2022-08-08 DIAGNOSIS — R569 Unspecified convulsions: Secondary | ICD-10-CM

## 2022-08-08 NOTE — Progress Notes (Unsigned)
Location:  [name] Room Number: N029-A Place of Service:  SNF (31) Provider:  Breena Bevacqua X, NP  Patient Care Team: Loyde Orth X, NP as PCP - General (Internal Medicine) Ernesto Rutherford, MD as Consulting Physician (Ophthalmology) Jake Bathe, MD as Consulting Physician (Cardiology) Venancio Poisson, MD as Consulting Physician (Dermatology) Ranee Gosselin, MD as Consulting Physician (Orthopedic Surgery) Eugenia Mcalpine, MD as Consulting Physician (Orthopedic Surgery) Bradly Bienenstock, MD as Consulting Physician (Orthopedic Surgery) [name] Martha Alberto X, NP as Nurse Practitioner (Nurse Practitioner) York Spaniel, MD (Inactive) as Consulting Physician (Neurology)  Extended Emergency Contact Information Primary Emergency Contact: [name],[name] Address: [address]          [address]          [place], [place] [address] [place] of [place] Home Phone: [phone] Relation: Sister Secondary Emergency Contact: [place] Home Phone: [phone] Mobile Phone: [phone] Relation: Sister  Code Status: DNR Goals of care: Advanced Directive information    08/08/2022    8:41 AM  Advanced Directives  Does Patient Have a Medical Advance Directive? Yes  Type of Advance Directive Out of facility DNR (pink MOST or yellow form)  Does patient want to make changes to medical advance directive? No - Patient declined  Pre-existing out of facility DNR order (yellow form or pink MOST form) Pink MOST form placed in chart (order not valid for inpatient use);Yellow form placed in chart (order not valid for inpatient use)     Chief Complaint  Patient presents with   Medical Management of Chronic Issues    Routine visit.     HPI:  Pt is a 89 y.o. female seen today for medical management of chronic diseases.    02/12/22 Obstructive nephritis, underwent cystoscopy with right retrograde pyelogram, right ureteral stent  placed, f/u Urology Dr. Berneice Heinrich, 03/06/22 cystoscopy with retrograde pyelogram, ureteroscopy and stent replacement, SPC exchange.    05/03/22 abd [place] no renal or gallbladder stone or hydronephrosis. 05/02/22 Xray abd may represent ileus. Hx of Ureteral stone with hydronephrosis per CT abd               05/13/22 Urology: R ureteral stone, sp R ureteroscopy stone free 03/2022, stent. SP  urethral stricture, on Oxybutynin. Hx of Ureteral stone with hydronephrosis per CT abd              Hypokalemia, K 3.2 05/07/22, placed on Kcl             Anemia, Hgb 11.4 05/07/22             SVT, takes Coreg             BLE edema, not apparent, off Furosemide.              Adult failure to thrive, under Hospice service.              Suprapubic Catheter, f/u Urology, off Uribel, on Oxybutynin.              Depression, stable, off Lorazepam             Dementia, under hospice service             Restless leg syndrome, stable, taking MiraPex.              Hx of seizures, no active seizures, off meds.              COPD,  on ProAir, Albuterol neb.     Past Medical History:  Diagnosis Date   Acute bronchitis 05/23/2011   Acute upper respiratory infections of unspecified site 05/23/2011   Arthritis    Chronic airway obstruction, not elsewhere classified 05/23/2011   Coronary artery disease    Disturbance of salivary secretion 01/31/2011   Dizziness and giddiness 01/31/2011   Dyspnea    Essential tremor 04/25/2014   External hemorrhoids without mention of complication 01/31/2011   Gait disorder 04/25/2014   GERD (gastroesophageal reflux disease)    History of kidney stones    Insomnia, unspecified 09/12/2011   Lumbago 01/31/2011   Major depressive disorder, single episode, unspecified 01/31/2011   Memory disorder 04/25/2014   Mitral valve disorders(424.0) 01/31/2011   Other and unspecified hyperlipidemia 01/31/2011   Other convulsions 01/31/2011   Other emphysema (HCC) 01/31/2011   Pain in joint, site  unspecified 01/31/2011   Restless legs syndrome (RLS) 09/12/2011   Retinal detachment with retinal defect of right eye 2011   right eye twice   Seizure disorder (HCC)    Senile osteoporosis 01/31/2011   Spontaneous ecchymoses 01/31/2011   Squamous cell carcinoma of leg    Stiffness of joints, not elsewhere classified, multiple sites 01/31/2011   Unspecified essential hypertension 01/31/2011   Past Surgical History:  Procedure Laterality Date   ABDOMINAL HYSTERECTOMY  06/21/2003   TAH/BSO, omenectomy PSB resect, Stg IC cystadenofibroma   CHOLECYSTECTOMY  2005   Dr. Carolynne Edouard   CYSTOSCOPY W/ URETERAL STENT PLACEMENT Right 02/13/2022   Procedure: CYSTOSCOPY WITH RETROGRADE PYELOGRAM/URETERAL STENT PLACEMENT, AND SUPRAPUBIC TUBE EXCHANGE;  Surgeon: Sebastian Ache, MD;  Location: [facility];  Service: Urology;  Laterality: Right;   CYSTOSCOPY WITH RETROGRADE PYELOGRAM, URETEROSCOPY AND STENT PLACEMENT Right 03/06/2022   Procedure: CYSTOSCOPY WITH RETROGRADE PYELOGRAM, URETEROSCOPY AND STENT REPLACEMENT, SUPRAPUBIC CATHETER EXCHANGE;  Surgeon: Sebastian Ache, MD;  Location: [facility];  Service: Urology;  Laterality: Right;   ELBOW SURGERY Right 2008   broken   Dr. Orlan Leavens   EYE SURGERY     HOLMIUM LASER APPLICATION Right 03/06/2022   Procedure: HOLMIUM LASER APPLICATION;  Surgeon: Sebastian Ache, MD;  Location: [facility];  Service: Urology;  Laterality: Right;   INTRAMEDULLARY (IM) NAIL INTERTROCHANTERIC Left 08/30/2021   Procedure: INTRAMEDULLARY (IM) NAIL INTERTROCHANTRIC;  Surgeon: Samson Frederic, MD;  Location: [facility];  Service: Orthopedics;  Laterality: Left;   ORIF PATELLA Left 05/02/2020   Procedure: OPEN REDUCTION INTERNAL (ORIF) FIXATION LEFT PATELLA WITH MEDIAL AND LATERAL LIGAMENT REINFORCEMENTS;  Surgeon: Sheral Apley, MD;  Location: [facility];  Service: Orthopedics;  Laterality: Left;   ORIF PATELLA Left 05/30/2020   Procedure: OPEN REDUCTION INTERNAL (ORIF) FIXATION PATELLA;  Surgeon: Sheral Apley, MD;  Location: [facility];  Service: Orthopedics;  Laterality: Left;   RETINAL DETACHMENT SURGERY N/A    two   REVERSE SHOULDER ARTHROPLASTY Left 05/06/2019   Procedure: REVERSE SHOULDER ARTHROPLASTY;  Surgeon: Francena Hanly, MD;  Location: [facility];  Service: Orthopedics;  Laterality: Left;    ROTATOR CUFF REPAIR Right 2012   Dr. Thomasena Edis   SQUAMOUS CELL CARCINOMA EXCISION Bilateral 2012, 8/14   [place] on legs   Dr. Irene Limbo   TONSILLECTOMY  1941   VIDEO BRONCHOSCOPY WITH ENDOBRONCHIAL NAVIGATION N/A 11/29/2015   Procedure: VIDEO BRONCHOSCOPY WITH ENDOBRONCHIAL NAVIGATION;  Surgeon: Leslye Peer, MD;  Location: [facility];  Service: Thoracic;  Laterality: N/A;    Allergies  Allergen Reactions   Vioxx [Rofecoxib] Shortness Of Breath   Dyazide [  Hydrochlorothiazide W-Triamterene] Other (See Comments)    Lowers blood pressure too much   Latex Swelling and Other (See Comments)    "Allergic," per [facility]   Sulfa Antibiotics Nausea And Vomiting and Other (See Comments)    "Allergic," per [facility]   Sumycin [Tetracycline] Other (See Comments)    Can't take due to drug reaction- "Allergic," per [facility]    Outpatient Encounter Medications as of 08/08/2022  Medication Sig   acetaminophen (TYLENOL) 325 MG tablet Take 650 mg by mouth every 6 (six) hours as needed for fever or mild pain. Give 2 tablet by mouth every 6 hours as needed for Pain   albuterol (ACCUNEB) 1.25 MG/3ML nebulizer solution Take 1 ampule by nebulization every 6 (six) hours as needed for shortness of breath.   Albuterol Sulfate (PROAIR RESPICLICK) 108 (90 Base) MCG/ACT AEPB Inhale 2 puffs into the lungs daily as needed (shortness of breath or wheezing).   carvedilol (COREG) 3.125 MG tablet Take 1 tablet (3.125 mg total) by mouth 2 (two) times daily with a meal.   Emollient (LUBRIDERM DAILY MOISTURE) LOTN Apply to left lower leg topically three times daily to rash, mix with triamcinolone 0/1% cream   fluticasone (VERAMYST) 27.5  MCG/SPRAY nasal spray Place 1 spray into the nose daily.   ketoconazole (NIZORAL) 2 % shampoo Apply 1 application  topically See admin instructions. Apply topically to scalp on Mondays and Thursdays for hair care   oxybutynin (DITROPAN) 5 MG tablet Take 1 tablet (5 mg total) by mouth 3 (three) times daily.   potassium chloride (MICRO-K) 10 MEQ CR capsule Take 10 mEq by mouth daily.   pramipexole (MIRAPEX) 0.5 MG tablet Take 0.5 mg by mouth at bedtime.   Sodium Chloride Flush (NORMAL SALINE FLUSH) 0.9 % SOLN Inject 30 mLs into the vein as directed. Monday, Wednesday, Thursday, Friday and Sunday   triamcinolone (KENALOG) 0.1 % Apply 1 application. topically daily as needed (psoriasis).   [DISCONTINUED] hydrocortisone 2.5 % lotion Apply 1 application  topically 2 (two) times daily as needed (face and ears as need for psoriasis). (Patient not taking: Reported on 07/08/2022)   [DISCONTINUED] hydrocortisone cream 1 % Apply 1 application  topically every 12 (twelve) hours as needed for itching (dry skin related to psoriasis). (Patient not taking: Reported on 07/08/2022)   [DISCONTINUED] ipratropium (ATROVENT) 0.02 % nebulizer solution Take 2.5 mLs (0.5 mg total) by nebulization 3 (three) times daily for 3 days, THEN 2.5 mLs (0.5 mg total) every 6 (six) hours as needed for up to 3 days. (Patient not taking: Reported on 02/27/2022)   [DISCONTINUED] ketoconazole (NIZORAL) 2 % cream Apply 1 application  topically daily as needed (psoriasis). (Patient not taking: Reported on 07/08/2022)   [DISCONTINUED] levalbuterol (XOPENEX) 0.63 MG/3ML nebulizer solution Take 3 mLs (0.63 mg total) by nebulization 3 (three) times daily for 3 days, THEN 3 mLs (0.63 mg total) every 6 (six) hours as needed for up to 3 days. (Patient not taking: Reported on 02/27/2022)   [DISCONTINUED] nystatin (MYCOSTATIN/NYSTOP) powder Apply 1 application  topically every 12 (twelve) hours as needed (urogenital area). (Patient not taking: Reported on  07/08/2022)   No facility-administered encounter medications on file as of 08/08/2022.    Review of Systems  Constitutional:  Negative for appetite change, fatigue and fever.  HENT:  Positive for hearing loss. Negative for congestion and trouble swallowing.   Eyes:  Negative for visual disturbance.  Respiratory:  Negative for cough and shortness of breath.  DOE is chronic  Cardiovascular:  Negative for leg swelling.  Gastrointestinal:  Negative for abdominal pain and constipation.       Acid reflux symptoms.   Genitourinary:  Positive for difficulty urinating. Negative for hematuria.       [facility]  Musculoskeletal:  Positive for arthralgias and gait problem.       S/p left hip ORIF, ORIF of the left patella.   Skin:  Negative for color change.       BLE discoloration, chronic venous insufficiency skin changes   Neurological:  Negative for seizures, speech difficulty, weakness and headaches.       Memory lapses. Hx of seizures. RLS  Psychiatric/Behavioral:  Negative for behavioral problems and sleep disturbance. The patient is not nervous/anxious.     Immunization History  Administered Date(s) Administered   Influenza Split 01/06/2014, 01/15/2017, 01/08/2018, 12/09/2018   Influenza Whole 01/07/2012, 01/06/2013   Influenza, High Dose Seasonal PF 12/25/2015, 01/20/2017   Influenza,inj,Quad PF,6+ Mos 12/21/2014   Influenza-Unspecified 12/09/2018, 01/18/2020, 01/24/2021, 01/30/2022   Moderna SARS-COV2 Booster Vaccination 04/02/2021   Moderna Sars-Covid-2 Vaccination 04/12/2019, 06/05/2019, 02/15/2020, 09/05/2020   PFIZER(Purple Top)SARS-COV-2 Vaccination 12/26/2020   PPD Test 02/16/2022   Pneumococcal Conjugate-13 02/01/2014   Pneumococcal Polysaccharide-23 12/20/1992, 01/15/2000, 04/08/2004, 06/08/2004   Rsv, Bivalent, Protein Subunit Rsvpref,pf Verdis Frederickson) 04/26/2022   Td 04/08/2002, 04/22/2002   Tdap 04/09/2011, 08/17/2015   Zoster Recombinat (Shingrix) 05/29/2005, 08/27/2017,  06/13/2022   Zoster, Live 04/08/2008, 05/29/2014, 05/28/2017   Zoster, Unspecified 05/29/2005   Pertinent  Health Maintenance Due  Topic Date Due   INFLUENZA VACCINE  11/07/2022   DEXA SCAN  Completed   MAMMOGRAM  Discontinued      02/15/2022    3:00 PM 02/15/2022    9:00 PM 02/16/2022    8:00 AM 03/06/2022    7:25 AM 05/20/2022    3:42 PM  Fall Risk  Falls in the past year?     0  Was there an injury with Fall?     0  Fall Risk Category Calculator     0  (RETIRED) Patient Fall Risk Level Low fall risk Moderate fall risk Moderate fall risk High fall risk   Patient at Risk for Falls Due to     No Fall Risks  Fall risk Follow up     Falls evaluation completed   Functional Status Survey:    Vitals:   08/08/22 0816  BP: (!) 148/74  Pulse: 90  Weight: 147 lb 6.4 oz (66.9 kg)  Height: 5\' 3"  (1.6 m)   Body mass index is 26.11 kg/m. Physical Exam Vitals and nursing note reviewed.  Constitutional:      Appearance: Normal appearance.  HENT:     Head: Normocephalic and atraumatic.     Nose: Nose normal.     Mouth/Throat:     Mouth: Mucous membranes are moist.  Eyes:     Extraocular Movements: Extraocular movements intact.     Conjunctiva/sclera: Conjunctivae normal.     Right eye: Right conjunctiva is not injected.     Left eye: Left conjunctiva is not injected.     Pupils: Pupils are equal, round, and reactive to light.  Neck:     Comments: Left adam's apple bony aspect, sometimes feels soreness on palpation is chronic, no noted lymph nodes  Cardiovascular:     Rate and Rhythm: Normal rate and regular rhythm.     Heart sounds: Murmur heard.     Comments: PD pulses are not felt from  previous examination.  Pulmonary:     Effort: Pulmonary effort is normal.     Breath sounds: No rales.     Comments: Decreased air entry to both lungs.  Abdominal:     General: Bowel sounds are normal.     Palpations: Abdomen is soft.     Tenderness: There is no abdominal tenderness.      Comments: Mid abd surgical scar. SPC  Genitourinary:    Comments: SPC Musculoskeletal:     Cervical back: Normal range of motion and neck supple.     Right lower leg: No edema.     Left lower leg: No edema.     Comments: Decreased overhead ROM of the left shoulder. Left knee s/p ORIF of the patella fx. Trace edema BLE  Skin:    General: Skin is warm and dry.     Comments: BLE discoloration, chronic venous insufficiency skin changes   Neurological:     General: No focal deficit present.     Mental Status: She is alert. Mental status is at baseline.     Gait: Gait abnormal.  Psychiatric:        Mood and Affect: Mood normal.        Behavior: Behavior normal.     Labs reviewed: Recent Labs    09/02/21 0359 09/04/21 1308 02/13/22 0543 02/14/22 0425 02/15/22 0427 02/16/22 0904 02/21/22 0000 05/07/22 0000  NA 138   < > 141 142 141 139 142 141  K 3.2*   < > 3.8 3.1* 3.2* 3.4* 3.3* 3.2*  CL 104   < > 112* 117* 115* 111 108 104  CO2 27   < > 22 18* 21* 24 27* 29*  GLUCOSE 98   < > 97 87 100* 100*  --   --   BUN 21   < > 36* 34* 21 15 11 15   CREATININE 0.78   < > 0.87 0.70 0.52 0.60 0.6 0.6  CALCIUM 8.5*   < > 8.3* 7.9* 8.2* 8.2* 8.1* 8.8  MG 2.2  --  2.3 2.1  --   --   --   --    < > = values in this interval not displayed.   Recent Labs    11/04/21 1047 12/11/21 0000 05/07/22 0000  AST 20 12* 12*  ALT 12 8 6*  ALKPHOS 157* 137* 100  BILITOT 0.5  --   --   PROT 7.0  --   --   ALBUMIN 3.6 3.0* 3.3*   Recent Labs    12/11/21 0000 02/12/22 1831 02/14/22 0425 02/15/22 0427 02/16/22 0904 02/21/22 0000 05/07/22 0000  WBC 4.0   < > 11.7* 9.3 9.7 6.3 5.0  NEUTROABS 2,352.00  --   --   --   --  4,082.00 2,815.00  HGB 11.7*   < > 9.9* 10.4* 10.9* 10.2* 11.4*  HCT 37   < > 32.6* 34.2* 37.0 32* 36  MCV  --    < > 85.1 84.7 86.9  --   --   PLT 179   < > 218 231 282 329 279   < > = values in this interval not displayed.   Lab Results  Component Value Date   TSH  1.198 02/15/2022   No results found for: "HGBA1C" Lab Results  Component Value Date   CHOL 147 09/27/2021   HDL 48 09/27/2021   LDLCALC 78 09/27/2021   TRIG 131 09/27/2021   CHOLHDL 3.6 06/27/2015  Significant Diagnostic Results in last 30 days:  No results found.  Assessment/Plan There are no diagnoses linked to this encounter.   Family/ staff Communication: ***  Labs/tests ordered:  ***

## 2022-08-09 ENCOUNTER — Encounter: Payer: Self-pay | Admitting: Nurse Practitioner

## 2022-08-09 NOTE — Assessment & Plan Note (Signed)
Suprapubic Catheter, f/u Urology, off Uribel, on Oxybutynin.

## 2022-08-09 NOTE — Assessment & Plan Note (Signed)
no active seizures, off meds.  

## 2022-08-09 NOTE — Assessment & Plan Note (Signed)
Stable,  under hospice service 

## 2022-08-09 NOTE — Assessment & Plan Note (Signed)
on ProAir, Albuterol neb.

## 2022-08-09 NOTE — Assessment & Plan Note (Signed)
stable, taking MiraPex.  

## 2022-08-09 NOTE — Assessment & Plan Note (Signed)
Heart rate is in control, takes Coreg 

## 2022-08-12 DIAGNOSIS — K0510 Chronic gingivitis, plaque induced: Secondary | ICD-10-CM | POA: Insufficient documentation

## 2022-08-12 NOTE — Assessment & Plan Note (Signed)
Lower bum redness, apply Orajel prn for soreness/discomfort.

## 2022-08-19 DIAGNOSIS — F331 Major depressive disorder, recurrent, moderate: Secondary | ICD-10-CM | POA: Diagnosis not present

## 2022-08-19 DIAGNOSIS — F411 Generalized anxiety disorder: Secondary | ICD-10-CM | POA: Diagnosis not present

## 2022-09-05 ENCOUNTER — Non-Acute Institutional Stay (SKILLED_NURSING_FACILITY): Payer: Medicare PPO | Admitting: Nurse Practitioner

## 2022-09-05 DIAGNOSIS — I471 Supraventricular tachycardia, unspecified: Secondary | ICD-10-CM

## 2022-09-05 DIAGNOSIS — R531 Weakness: Secondary | ICD-10-CM | POA: Diagnosis not present

## 2022-09-05 DIAGNOSIS — G2581 Restless legs syndrome: Secondary | ICD-10-CM

## 2022-09-05 DIAGNOSIS — G40909 Epilepsy, unspecified, not intractable, without status epilepticus: Secondary | ICD-10-CM

## 2022-09-05 DIAGNOSIS — E876 Hypokalemia: Secondary | ICD-10-CM

## 2022-09-05 DIAGNOSIS — J431 Panlobular emphysema: Secondary | ICD-10-CM

## 2022-09-05 DIAGNOSIS — Z9359 Other cystostomy status: Secondary | ICD-10-CM | POA: Diagnosis not present

## 2022-09-05 NOTE — Assessment & Plan Note (Signed)
Heart rate is in control, takes Coreg 

## 2022-09-05 NOTE — Assessment & Plan Note (Signed)
K 3.2 05/07/22, placed on Kcl 

## 2022-09-05 NOTE — Assessment & Plan Note (Signed)
Bp 80/58 then 110/58, denied dizziness, chest pain, palpitation, or SOB,  c/o generalized weakness, the patient attributed it to her not sleeping well last couple of nights.  The declined further labs or workup, will hold Carvedilol if Bp<90/60, observe the patient.

## 2022-09-05 NOTE — Assessment & Plan Note (Signed)
no active seizures, off meds.  

## 2022-09-05 NOTE — Assessment & Plan Note (Signed)
stable, taking MiraPex.  

## 2022-09-05 NOTE — Assessment & Plan Note (Signed)
Stable, on ProAir, Albuterol neb.  

## 2022-09-05 NOTE — Progress Notes (Signed)
Location:   SNF [facility] Room Number: NO/29/A Place of Service:  SNF (31) Provider: Arna Snipe Tamalyn Wadsworth NP  Geeta Dworkin X, NP  Patient Care Team: Margeret Stachnik X, NP as PCP - General (Internal Medicine) Ernesto Rutherford, MD as Consulting Physician (Ophthalmology) Jake Bathe, MD as Consulting Physician (Cardiology) Venancio Poisson, MD as Consulting Physician (Dermatology) Ranee Gosselin, MD as Consulting Physician (Orthopedic Surgery) Eugenia Mcalpine, MD as Consulting Physician (Orthopedic Surgery) Bradly Bienenstock, MD as Consulting Physician (Orthopedic Surgery) [name] Mahina Salatino X, NP as Nurse Practitioner (Nurse Practitioner) York Spaniel, MD (Inactive) as Consulting Physician (Neurology)  Extended Emergency Contact Information Primary Emergency Contact: [name],[name] Address: [address]          [address]          [place], [place] [address] [place] of [place] Home Phone: [phone] Relation: Sister Secondary Emergency Contact: [place] Home Phone: [phone] Mobile Phone: [phone] Relation: Sister  Code Status: DNR Goals of care: Advanced Directive information    08/08/2022    8:41 AM  Advanced Directives  Does Patient Have a Medical Advance Directive? Yes  Type of Advance Directive Out of facility DNR (pink MOST or yellow form)  Does patient want to make changes to medical advance directive? No - Patient declined  Pre-existing out of facility DNR order (yellow form or pink MOST form) Pink MOST form placed in chart (order not valid for inpatient use);Yellow form placed in chart (order not valid for inpatient use)     Chief Complaint  Patient presents with   Acute Visit    Patient is being seen for restless leg syndrome, generalized weakness, and low blood pressure    HPI:  Pt is a 89 y.o. female seen today for an acute visit for c/o generalized weakness, the patient attributed it to her not sleeping well  last couple of nights.    02/12/22 Obstructive nephritis, underwent cystoscopy with right retrograde pyelogram, right ureteral stent placed, f/u Urology Dr. Berneice Heinrich, 03/06/22 cystoscopy with retrograde pyelogram, ureteroscopy and stent replacement, SPC exchange.                05/03/22 abd [place] no renal or gallbladder stone or hydronephrosis. 05/02/22 Xray abd may represent ileus. Hx of Ureteral stone with hydronephrosis per CT abd                          05/13/22 Urology: R ureteral stone, sp R ureteroscopy stone free 03/2022, stent. SP  urethral stricture, on Oxybutynin. Hx of Ureteral stone with hydronephrosis per CT abd               Hypokalemia, K 3.2 05/07/22, placed on Kcl             Anemia, Hgb 11.4 05/07/22             SVT, takes Coreg             BLE edema, not apparent, off Furosemide.              Adult failure to thrive, under Hospice service.              Suprapubic Catheter, f/u Urology, off Uribel, on Oxybutynin.              Depression, stable, off Lorazepam             Dementia, under hospice service  Restless leg syndrome, stable, taking MiraPex.              Hx of seizures, no active seizures, off meds.              COPD, on ProAir, Albuterol neb.    Past Medical History:  Diagnosis Date   Acute bronchitis 05/23/2011   Acute upper respiratory infections of unspecified site 05/23/2011   Arthritis    Chronic airway obstruction, not elsewhere classified 05/23/2011   Coronary artery disease    Disturbance of salivary secretion 01/31/2011   Dizziness and giddiness 01/31/2011   Dyspnea    Essential tremor 04/25/2014   External hemorrhoids without mention of complication 01/31/2011   Gait disorder 04/25/2014   GERD (gastroesophageal reflux disease)    History of kidney stones    Insomnia, unspecified 09/12/2011   Lumbago 01/31/2011   Major depressive disorder, single episode, unspecified 01/31/2011   Memory disorder 04/25/2014   Mitral valve disorders(424.0)  01/31/2011   Other and unspecified hyperlipidemia 01/31/2011   Other convulsions 01/31/2011   Other emphysema (HCC) 01/31/2011   Pain in joint, site unspecified 01/31/2011   Restless legs syndrome (RLS) 09/12/2011   Retinal detachment with retinal defect of right eye 2011   right eye twice   Seizure disorder (HCC)    Senile osteoporosis 01/31/2011   Spontaneous ecchymoses 01/31/2011   Squamous cell carcinoma of leg    Stiffness of joints, not elsewhere classified, multiple sites 01/31/2011   Unspecified essential hypertension 01/31/2011   Past Surgical History:  Procedure Laterality Date   ABDOMINAL HYSTERECTOMY  06/21/2003   TAH/BSO, omenectomy PSB resect, Stg IC cystadenofibroma   CHOLECYSTECTOMY  2005   Dr. Carolynne Edouard   CYSTOSCOPY W/ URETERAL STENT PLACEMENT Right 02/13/2022   Procedure: CYSTOSCOPY WITH RETROGRADE PYELOGRAM/URETERAL STENT PLACEMENT, AND SUPRAPUBIC TUBE EXCHANGE;  Surgeon: Sebastian Ache, MD;  Location: [facility];  Service: Urology;  Laterality: Right;   CYSTOSCOPY WITH RETROGRADE PYELOGRAM, URETEROSCOPY AND STENT PLACEMENT Right 03/06/2022   Procedure: CYSTOSCOPY WITH RETROGRADE PYELOGRAM, URETEROSCOPY AND STENT REPLACEMENT, SUPRAPUBIC CATHETER EXCHANGE;  Surgeon: Sebastian Ache, MD;  Location: [facility];  Service: Urology;  Laterality: Right;   ELBOW SURGERY Right 2008   broken   Dr. Orlan Leavens   EYE SURGERY     HOLMIUM LASER APPLICATION Right 03/06/2022   Procedure: HOLMIUM LASER APPLICATION;  Surgeon: Sebastian Ache, MD;  Location: [facility];  Service: Urology;  Laterality: Right;   INTRAMEDULLARY (IM) NAIL INTERTROCHANTERIC Left 08/30/2021   Procedure: INTRAMEDULLARY (IM) NAIL INTERTROCHANTRIC;  Surgeon: Samson Frederic, MD;  Location: [facility];  Service: Orthopedics;  Laterality: Left;   ORIF PATELLA Left 05/02/2020   Procedure: OPEN REDUCTION INTERNAL (ORIF) FIXATION LEFT PATELLA WITH MEDIAL AND LATERAL LIGAMENT REINFORCEMENTS;  Surgeon: Sheral Apley, MD;  Location: [facility];   Service: Orthopedics;  Laterality: Left;   ORIF PATELLA Left 05/30/2020   Procedure: OPEN REDUCTION INTERNAL (ORIF) FIXATION PATELLA;  Surgeon: Sheral Apley, MD;  Location: [facility];  Service: Orthopedics;  Laterality: Left;   RETINAL DETACHMENT SURGERY N/A    two   REVERSE SHOULDER ARTHROPLASTY Left 05/06/2019   Procedure: REVERSE SHOULDER ARTHROPLASTY;  Surgeon: Francena Hanly, MD;  Location: [facility];  Service: Orthopedics;  Laterality: Left;    ROTATOR CUFF REPAIR Right 2012   Dr. Thomasena Edis   SQUAMOUS CELL CARCINOMA EXCISION Bilateral 2012, 8/14   [place] on legs   Dr. Irene Limbo   TONSILLECTOMY  1941   VIDEO BRONCHOSCOPY WITH ENDOBRONCHIAL NAVIGATION N/A 11/29/2015  Procedure: VIDEO BRONCHOSCOPY WITH ENDOBRONCHIAL NAVIGATION;  Surgeon: Leslye Peer, MD;  Location: [facility];  Service: Thoracic;  Laterality: N/A;    Allergies  Allergen Reactions   Vioxx [Rofecoxib] Shortness Of Breath   Dyazide [Hydrochlorothiazide W-Triamterene] Other (See Comments)    Lowers blood pressure too much   Latex Swelling and Other (See Comments)    "Allergic," per [facility]   Sulfa Antibiotics Nausea And Vomiting and Other (See Comments)    "Allergic," per [facility]   Sumycin [Tetracycline] Other (See Comments)    Can't take due to drug reaction- "Allergic," per [facility]    Allergies as of 09/05/2022       Reactions   Vioxx [rofecoxib] Shortness Of Breath   Dyazide [hydrochlorothiazide W-triamterene] Other (See Comments)   Lowers blood pressure too much   Latex Swelling, Other (See Comments)   "Allergic," per [facility]   Sulfa Antibiotics Nausea And Vomiting, Other (See Comments)   "Allergic," per [facility]   Sumycin [tetracycline] Other (See Comments)   Can't take due to drug reaction- "Allergic," per [facility]        Medication List        Accurate as of Sep 05, 2022 11:59 PM. If you have any questions, ask your nurse or doctor.          acetaminophen 325 MG tablet Commonly known as: TYLENOL Take 650 mg by mouth  every 6 (six) hours as needed for fever or mild pain. Give 2 tablet by mouth every 6 hours as needed for Pain   carvedilol 3.125 MG tablet Commonly known as: COREG Take 1 tablet (3.125 mg total) by mouth 2 (two) times daily with a meal.   fluticasone 27.5 MCG/SPRAY nasal spray Commonly known as: VERAMYST Place 1 spray into the nose daily.   ketoconazole 2 % shampoo Commonly known as: NIZORAL Apply 1 application  topically See admin instructions. Apply topically to scalp on Mondays and Thursdays for hair care   Lubriderm Daily Moisture Lotn Apply to left lower leg topically three times daily to rash, mix with triamcinolone 0/1% cream   Normal Saline Flush 0.9 % Soln Inject 30 mLs into the vein as directed. Monday, Wednesday, Thursday, Friday and Sunday   oxybutynin 5 MG tablet Commonly known as: DITROPAN Take 1 tablet (5 mg total) by mouth 3 (three) times daily.   potassium chloride 10 MEQ CR capsule Commonly known as: MICRO-K Take 10 mEq by mouth daily.   pramipexole 0.5 MG tablet Commonly known as: MIRAPEX Take 0.5 mg by mouth at bedtime.   ProAir RespiClick 108 (90 Base) MCG/ACT Aepb Generic drug: Albuterol Sulfate Inhale 2 puffs into the lungs daily as needed (shortness of breath or wheezing).   albuterol 1.25 MG/3ML nebulizer solution Commonly known as: ACCUNEB Take 1 ampule by nebulization every 6 (six) hours as needed for shortness of breath.   triamcinolone cream 0.1 % Commonly known as: KENALOG Apply 1 application. topically daily as needed (psoriasis).        Review of Systems  Constitutional:  Positive for fatigue. Negative for appetite change and fever.  HENT:  Positive for hearing loss. Negative for congestion, mouth sores, sore throat and trouble swallowing.        C/o lower gum pain  Eyes:  Negative for visual disturbance.  Respiratory:  Negative for cough and shortness of breath.        DOE is chronic  Cardiovascular:  Negative for leg swelling.   Gastrointestinal:  Negative for abdominal pain and  constipation.       Acid reflux symptoms.   Genitourinary:  Positive for difficulty urinating. Negative for hematuria.       [facility]  Musculoskeletal:  Positive for arthralgias and gait problem.       S/p left hip ORIF, ORIF of the left patella.   Skin:  Negative for color change.       BLE discoloration, chronic venous insufficiency skin changes   Neurological:  Negative for seizures, speech difficulty, weakness and headaches.       Memory lapses. Hx of seizures. RLS  Psychiatric/Behavioral:  Positive for sleep disturbance. Negative for behavioral problems. The patient is not nervous/anxious.     Immunization History  Administered Date(s) Administered   Influenza Split 01/06/2014, 01/15/2017, 01/08/2018, 12/09/2018   Influenza Whole 01/07/2012, 01/06/2013   Influenza, High Dose Seasonal PF 12/25/2015, 01/20/2017   Influenza,inj,Quad PF,6+ Mos 12/21/2014   Influenza-Unspecified 12/09/2018, 01/18/2020, 01/24/2021, 01/30/2022   Moderna SARS-COV2 Booster Vaccination 04/02/2021   Moderna Sars-Covid-2 Vaccination 04/12/2019, 06/05/2019, 02/15/2020, 09/05/2020   PFIZER(Purple Top)SARS-COV-2 Vaccination 12/26/2020   PPD Test 02/16/2022   Pneumococcal Conjugate-13 02/01/2014   Pneumococcal Polysaccharide-23 12/20/1992, 01/15/2000, 04/08/2004, 06/08/2004   Rsv, Bivalent, Protein Subunit Rsvpref,pf Verdis Frederickson) 04/26/2022   Td 04/08/2002, 04/22/2002   Tdap 04/09/2011, 08/17/2015   Zoster Recombinat (Shingrix) 05/29/2005, 08/27/2017, 06/13/2022   Zoster, Live 04/08/2008, 05/29/2014, 05/28/2017   Zoster, Unspecified 05/29/2005   Pertinent  Health Maintenance Due  Topic Date Due   INFLUENZA VACCINE  11/07/2022   DEXA SCAN  Completed   MAMMOGRAM  Discontinued      02/15/2022    3:00 PM 02/15/2022    9:00 PM 02/16/2022    8:00 AM 03/06/2022    7:25 AM 05/20/2022    3:42 PM  Fall Risk  Falls in the past year?     0  Was there an injury  with Fall?     0  Fall Risk Category Calculator     0  (RETIRED) Patient Fall Risk Level Low fall risk Moderate fall risk Moderate fall risk High fall risk   Patient at Risk for Falls Due to     No Fall Risks  Fall risk Follow up     Falls evaluation completed   Functional Status Survey:    Vitals:   09/06/22 1235  BP: (!) 110/58  Pulse: 65  Resp: 17  Temp: 97.7 F (36.5 C)  SpO2: 95%  Weight: 147 lb 6.4 oz (66.9 kg)  Height: 5\' 3"  (1.6 m)   Body mass index is 26.11 kg/m. Physical Exam Vitals and nursing note reviewed.  Constitutional:      Appearance: Normal appearance.  HENT:     Head: Normocephalic and atraumatic.     Nose: Nose normal.     Mouth/Throat:     Mouth: Mucous membranes are moist.  Eyes:     Extraocular Movements: Extraocular movements intact.     Conjunctiva/sclera: Conjunctivae normal.     Right eye: Right conjunctiva is not injected.     Left eye: Left conjunctiva is not injected.     Pupils: Pupils are equal, round, and reactive to light.  Neck:     Comments: Left adam's apple bony aspect, sometimes feels soreness on palpation is chronic, no noted lymph nodes  Cardiovascular:     Rate and Rhythm: Normal rate and regular rhythm.     Heart sounds: Murmur heard.     Comments: PD pulses are not felt from previous examination.  Pulmonary:  Effort: Pulmonary effort is normal.     Breath sounds: No rales.     Comments: Decreased air entry to both lungs.  Abdominal:     General: Bowel sounds are normal.     Palpations: Abdomen is soft.     Tenderness: There is no abdominal tenderness.     Comments: Mid abd surgical scar. SPC  Genitourinary:    Comments: SPC Musculoskeletal:     Cervical back: Normal range of motion and neck supple.     Right lower leg: No edema.     Left lower leg: No edema.     Comments: Decreased overhead ROM of the left shoulder. Left knee s/p ORIF of the patella fx. Trace edema BLE  Skin:    General: Skin is warm and dry.      Comments: BLE discoloration, chronic venous insufficiency skin changes   Neurological:     General: No focal deficit present.     Mental Status: She is alert. Mental status is at baseline.     Gait: Gait abnormal.  Psychiatric:        Mood and Affect: Mood normal.        Behavior: Behavior normal.     Labs reviewed: Recent Labs    02/13/22 0543 02/14/22 0425 02/15/22 0427 02/16/22 0904 02/21/22 0000 05/07/22 0000  NA 141 142 141 139 142 141  K 3.8 3.1* 3.2* 3.4* 3.3* 3.2*  CL 112* 117* 115* 111 108 104  CO2 22 18* 21* 24 27* 29*  GLUCOSE 97 87 100* 100*  --   --   BUN 36* 34* 21 15 11 15   CREATININE 0.87 0.70 0.52 0.60 0.6 0.6  CALCIUM 8.3* 7.9* 8.2* 8.2* 8.1* 8.8  MG 2.3 2.1  --   --   --   --    Recent Labs    11/04/21 1047 12/11/21 0000 05/07/22 0000  AST 20 12* 12*  ALT 12 8 6*  ALKPHOS 157* 137* 100  BILITOT 0.5  --   --   PROT 7.0  --   --   ALBUMIN 3.6 3.0* 3.3*   Recent Labs    12/11/21 0000 02/12/22 1831 02/14/22 0425 02/15/22 0427 02/16/22 0904 02/21/22 0000 05/07/22 0000  WBC 4.0   < > 11.7* 9.3 9.7 6.3 5.0  NEUTROABS 2,352.00  --   --   --   --  4,082.00 2,815.00  HGB 11.7*   < > 9.9* 10.4* 10.9* 10.2* 11.4*  HCT 37   < > 32.6* 34.2* 37.0 32* 36  MCV  --    < > 85.1 84.7 86.9  --   --   PLT 179   < > 218 231 282 329 279   < > = values in this interval not displayed.   Lab Results  Component Value Date   TSH 1.198 02/15/2022   No results found for: "HGBA1C" Lab Results  Component Value Date   CHOL 147 09/27/2021   HDL 48 09/27/2021   LDLCALC 78 09/27/2021   TRIG 131 09/27/2021   CHOLHDL 3.6 06/27/2015    Significant Diagnostic Results in last 30 days:  No results found.  Assessment/Plan: Generalized weakness Bp 80/58 then 110/58, denied dizziness, chest pain, palpitation, or SOB,  c/o generalized weakness, the patient attributed it to her not sleeping well last couple of nights.  The declined further labs or workup, will  hold Carvedilol if Bp<90/60, observe the patient.   Suprapubic catheter [facility])  f/u Urology,  off Uribel, on Oxybutynin.   Restless legs syndrome (RLS)  stable, taking MiraPex.   Seizure disorder (HCC)  no active seizures, off meds.   COPD (chronic obstructive pulmonary disease) (HCC) Stable, on ProAir, Albuterol neb.   SVT (supraventricular tachycardia) Heart rate is in control,  takes Coreg  Hypokalemia K 3.2 05/07/22, placed on Kcl    Family/ staff Communication: plan of care reviewed with the patient and charge nurse.   Labs/tests ordered:  none  Time spend 35 minutes.

## 2022-09-05 NOTE — Assessment & Plan Note (Signed)
f/u Urology, off Uribel, on Oxybutynin.  

## 2022-09-06 ENCOUNTER — Encounter: Payer: Self-pay | Admitting: Nurse Practitioner

## 2022-09-16 ENCOUNTER — Non-Acute Institutional Stay (SKILLED_NURSING_FACILITY): Payer: Medicare PPO | Admitting: Nurse Practitioner

## 2022-09-16 ENCOUNTER — Encounter: Payer: Self-pay | Admitting: Nurse Practitioner

## 2022-09-16 DIAGNOSIS — H00014 Hordeolum externum left upper eyelid: Secondary | ICD-10-CM

## 2022-09-16 DIAGNOSIS — I471 Supraventricular tachycardia, unspecified: Secondary | ICD-10-CM | POA: Diagnosis not present

## 2022-09-16 DIAGNOSIS — G2581 Restless legs syndrome: Secondary | ICD-10-CM

## 2022-09-16 DIAGNOSIS — L989 Disorder of the skin and subcutaneous tissue, unspecified: Secondary | ICD-10-CM

## 2022-09-16 DIAGNOSIS — E876 Hypokalemia: Secondary | ICD-10-CM

## 2022-09-16 DIAGNOSIS — Z9359 Other cystostomy status: Secondary | ICD-10-CM

## 2022-09-16 DIAGNOSIS — R238 Other skin changes: Secondary | ICD-10-CM | POA: Diagnosis not present

## 2022-09-16 DIAGNOSIS — J431 Panlobular emphysema: Secondary | ICD-10-CM

## 2022-09-16 NOTE — Assessment & Plan Note (Signed)
takes Coreg, heart rate is in control.

## 2022-09-16 NOTE — Assessment & Plan Note (Signed)
stable, taking MiraPex.  

## 2022-09-16 NOTE — Assessment & Plan Note (Signed)
f/u Urology, off Uribel, on Oxybutynin, functioning well.

## 2022-09-16 NOTE — Assessment & Plan Note (Signed)
K 3.2 05/07/22, placed on Kcl 

## 2022-09-16 NOTE — Assessment & Plan Note (Signed)
Anterior left shin, no active bleeding or s/s of infection, the patient declined dermatology evaluation.

## 2022-09-16 NOTE — Assessment & Plan Note (Signed)
on ProAir, Albuterol neb.  

## 2022-09-16 NOTE — Assessment & Plan Note (Signed)
Left abd next to umbilicus, one is ruptured, one is about a small marble sized, no s/s of infection, the patient attributed it to tape used for [facility] dressing.

## 2022-09-16 NOTE — Progress Notes (Signed)
Location:   SNF [facility] Room Number: 36 Place of Service:  SNF (31) Provider: Arna Snipe Cythia Bachtel NP  Exander Shaul X, NP  Patient Care Team: Marycatherine Maniscalco X, NP as PCP - General (Internal Medicine) Ernesto Rutherford, MD as Consulting Physician (Ophthalmology) Jake Bathe, MD as Consulting Physician (Cardiology) Venancio Poisson, MD as Consulting Physician (Dermatology) Ranee Gosselin, MD as Consulting Physician (Orthopedic Surgery) Eugenia Mcalpine, MD as Consulting Physician (Orthopedic Surgery) Bradly Bienenstock, MD as Consulting Physician (Orthopedic Surgery) [name] Guitron X, NP as Nurse Practitioner (Nurse Practitioner) York Spaniel, MD (Inactive) as Consulting Physician (Neurology)  Extended Emergency Contact Information Primary Emergency Contact: [name],[name] Address: [address]          [address]          [place], [place] [address] [place] of [place] Home Phone: [phone] Relation: Sister Secondary Emergency Contact: [place] Home Phone: [phone] Mobile Phone: [phone] Relation: Sister  Code Status: DNR Goals of care: Advanced Directive information    08/08/2022    8:41 AM  Advanced Directives  Does Patient Have a Medical Advance Directive? Yes  Type of Advance Directive Out of facility DNR (pink MOST or yellow form)  Does patient want to make changes to medical advance directive? No - Patient declined  Pre-existing out of facility DNR order (yellow form or pink MOST form) Pink MOST form placed in chart (order not valid for inpatient use);Yellow form placed in chart (order not valid for inpatient use)     Chief Complaint  Patient presents with   Acute Visit    Left upper eyelid stye, left abd blisters x2, LLE skin lesions.     HPI:  Pt is a 89 y.o. female seen today for an acute visit for left upper eyelid stye, left abd next to umbilicus blisters, anterior LLE skin lesions.      02/12/22  Obstructive nephritis, underwent cystoscopy with right retrograde pyelogram, right ureteral stent placed, f/u Urology Dr. Berneice Heinrich, 03/06/22 cystoscopy with retrograde pyelogram, ureteroscopy and stent replacement, SPC exchange.                05/03/22 abd [place] no renal or gallbladder stone or hydronephrosis. 05/02/22 Xray abd may represent ileus. Hx of Ureteral stone with hydronephrosis per CT abd                          05/13/22 Urology: R ureteral stone, sp R ureteroscopy stone free 03/2022, stent. SP  urethral stricture, on Oxybutynin. Hx of Ureteral stone with hydronephrosis per CT abd               Hypokalemia, K 3.2 05/07/22, placed on Kcl             Anemia, Hgb 11.4 05/07/22             SVT, takes Coreg             BLE edema, not apparent, off Furosemide.              Adult failure to thrive, under Hospice service.              Suprapubic Catheter, f/u Urology, off Uribel, on Oxybutynin.              Depression, stable, off Lorazepam             Dementia, under hospice service  Restless leg syndrome, stable, taking MiraPex.              Hx of seizures, no active seizures, off meds.              COPD, on ProAir, Albuterol neb.    Past Medical History:  Diagnosis Date   Acute bronchitis 05/23/2011   Acute upper respiratory infections of unspecified site 05/23/2011   Arthritis    Chronic airway obstruction, not elsewhere classified 05/23/2011   Coronary artery disease    Disturbance of salivary secretion 01/31/2011   Dizziness and giddiness 01/31/2011   Dyspnea    Essential tremor 04/25/2014   External hemorrhoids without mention of complication 01/31/2011   Gait disorder 04/25/2014   GERD (gastroesophageal reflux disease)    History of kidney stones    Insomnia, unspecified 09/12/2011   Lumbago 01/31/2011   Major depressive disorder, single episode, unspecified 01/31/2011   Memory disorder 04/25/2014   Mitral valve disorders(424.0) 01/31/2011   Other and unspecified  hyperlipidemia 01/31/2011   Other convulsions 01/31/2011   Other emphysema (HCC) 01/31/2011   Pain in joint, site unspecified 01/31/2011   Restless legs syndrome (RLS) 09/12/2011   Retinal detachment with retinal defect of right eye 2011   right eye twice   Seizure disorder (HCC)    Senile osteoporosis 01/31/2011   Spontaneous ecchymoses 01/31/2011   Squamous cell carcinoma of leg    Stiffness of joints, not elsewhere classified, multiple sites 01/31/2011   Unspecified essential hypertension 01/31/2011   Past Surgical History:  Procedure Laterality Date   ABDOMINAL HYSTERECTOMY  06/21/2003   TAH/BSO, omenectomy PSB resect, Stg IC cystadenofibroma   CHOLECYSTECTOMY  2005   Dr. Carolynne Edouard   CYSTOSCOPY W/ URETERAL STENT PLACEMENT Right 02/13/2022   Procedure: CYSTOSCOPY WITH RETROGRADE PYELOGRAM/URETERAL STENT PLACEMENT, AND SUPRAPUBIC TUBE EXCHANGE;  Surgeon: Sebastian Ache, MD;  Location: [facility];  Service: Urology;  Laterality: Right;   CYSTOSCOPY WITH RETROGRADE PYELOGRAM, URETEROSCOPY AND STENT PLACEMENT Right 03/06/2022   Procedure: CYSTOSCOPY WITH RETROGRADE PYELOGRAM, URETEROSCOPY AND STENT REPLACEMENT, SUPRAPUBIC CATHETER EXCHANGE;  Surgeon: Sebastian Ache, MD;  Location: [facility];  Service: Urology;  Laterality: Right;   ELBOW SURGERY Right 2008   broken   Dr. Orlan Leavens   EYE SURGERY     HOLMIUM LASER APPLICATION Right 03/06/2022   Procedure: HOLMIUM LASER APPLICATION;  Surgeon: Sebastian Ache, MD;  Location: [facility];  Service: Urology;  Laterality: Right;   INTRAMEDULLARY (IM) NAIL INTERTROCHANTERIC Left 08/30/2021   Procedure: INTRAMEDULLARY (IM) NAIL INTERTROCHANTRIC;  Surgeon: Samson Frederic, MD;  Location: [facility];  Service: Orthopedics;  Laterality: Left;   ORIF PATELLA Left 05/02/2020   Procedure: OPEN REDUCTION INTERNAL (ORIF) FIXATION LEFT PATELLA WITH MEDIAL AND LATERAL LIGAMENT REINFORCEMENTS;  Surgeon: Sheral Apley, MD;  Location: [facility];  Service: Orthopedics;  Laterality:  Left;   ORIF PATELLA Left 05/30/2020   Procedure: OPEN REDUCTION INTERNAL (ORIF) FIXATION PATELLA;  Surgeon: Sheral Apley, MD;  Location: [facility];  Service: Orthopedics;  Laterality: Left;   RETINAL DETACHMENT SURGERY N/A    two   REVERSE SHOULDER ARTHROPLASTY Left 05/06/2019   Procedure: REVERSE SHOULDER ARTHROPLASTY;  Surgeon: Francena Hanly, MD;  Location: [facility];  Service: Orthopedics;  Laterality: Left;    ROTATOR CUFF REPAIR Right 2012   Dr. Thomasena Edis   SQUAMOUS CELL CARCINOMA EXCISION Bilateral 2012, 8/14   [place] on legs   Dr. Irene Limbo   TONSILLECTOMY  1941   VIDEO BRONCHOSCOPY WITH ENDOBRONCHIAL NAVIGATION N/A 11/29/2015  Procedure: VIDEO BRONCHOSCOPY WITH ENDOBRONCHIAL NAVIGATION;  Surgeon: Leslye Peer, MD;  Location: [facility];  Service: Thoracic;  Laterality: N/A;    Allergies  Allergen Reactions   Vioxx [Rofecoxib] Shortness Of Breath   Dyazide [Hydrochlorothiazide W-Triamterene] Other (See Comments)    Lowers blood pressure too much   Latex Swelling and Other (See Comments)    "Allergic," per [facility]   Sulfa Antibiotics Nausea And Vomiting and Other (See Comments)    "Allergic," per [facility]   Sumycin [Tetracycline] Other (See Comments)    Can't take due to drug reaction- "Allergic," per [facility]    Allergies as of 09/16/2022       Reactions   Vioxx [rofecoxib] Shortness Of Breath   Dyazide [hydrochlorothiazide W-triamterene] Other (See Comments)   Lowers blood pressure too much   Latex Swelling, Other (See Comments)   "Allergic," per [facility]   Sulfa Antibiotics Nausea And Vomiting, Other (See Comments)   "Allergic," per [facility]   Sumycin [tetracycline] Other (See Comments)   Can't take due to drug reaction- "Allergic," per [facility]        Medication List        Accurate as of September 16, 2022  1:05 PM. If you have any questions, ask your nurse or doctor.          acetaminophen 325 MG tablet Commonly known as: TYLENOL Take 650 mg by mouth every 6 (six) hours as needed for  fever or mild pain. Give 2 tablet by mouth every 6 hours as needed for Pain   carvedilol 3.125 MG tablet Commonly known as: COREG Take 1 tablet (3.125 mg total) by mouth 2 (two) times daily with a meal.   fluticasone 27.5 MCG/SPRAY nasal spray Commonly known as: VERAMYST Place 1 spray into the nose daily.   ketoconazole 2 % shampoo Commonly known as: NIZORAL Apply 1 application  topically See admin instructions. Apply topically to scalp on Mondays and Thursdays for hair care   Lubriderm Daily Moisture Lotn Apply to left lower leg topically three times daily to rash, mix with triamcinolone 0/1% cream   Normal Saline Flush 0.9 % Soln Inject 30 mLs into the vein as directed. Monday, Wednesday, Thursday, Friday and Sunday   oxybutynin 5 MG tablet Commonly known as: DITROPAN Take 1 tablet (5 mg total) by mouth 3 (three) times daily.   potassium chloride 10 MEQ CR capsule Commonly known as: MICRO-K Take 10 mEq by mouth daily.   pramipexole 0.5 MG tablet Commonly known as: MIRAPEX Take 0.5 mg by mouth at bedtime.   ProAir RespiClick 108 (90 Base) MCG/ACT Aepb Generic drug: Albuterol Sulfate Inhale 2 puffs into the lungs daily as needed (shortness of breath or wheezing).   albuterol 1.25 MG/3ML nebulizer solution Commonly known as: ACCUNEB Take 1 ampule by nebulization every 6 (six) hours as needed for shortness of breath.   triamcinolone cream 0.1 % Commonly known as: KENALOG Apply 1 application. topically daily as needed (psoriasis).        Review of Systems  Constitutional:  Negative for appetite change, fatigue and fever.  HENT:  Positive for hearing loss. Negative for congestion and trouble swallowing.        C/o lower gum pain  Eyes:  Negative for visual disturbance.       Left upper eyelid stye  Respiratory:  Negative for cough and shortness of breath.        DOE is chronic  Cardiovascular:  Negative for leg swelling.  Gastrointestinal:  Negative  for abdominal  pain and constipation.       Acid reflux symptoms.   Genitourinary:  Positive for difficulty urinating. Negative for hematuria.       [facility]  Musculoskeletal:  Positive for arthralgias and gait problem.       S/p left hip ORIF, ORIF of the left patella.   Skin:  Negative for color change.       BLE discoloration, chronic venous insufficiency skin changes. Left shin skin lesions. Left abd blisters.    Neurological:  Negative for seizures, weakness and headaches.       Memory lapses. Hx of seizures. RLS  Psychiatric/Behavioral:  Positive for sleep disturbance. Negative for behavioral problems. The patient is not nervous/anxious.     Immunization History  Administered Date(s) Administered   Influenza Split 01/06/2014, 01/15/2017, 01/08/2018, 12/09/2018   Influenza Whole 01/07/2012, 01/06/2013   Influenza, High Dose Seasonal PF 12/25/2015, 01/20/2017   Influenza,inj,Quad PF,6+ Mos 12/21/2014   Influenza-Unspecified 12/09/2018, 01/18/2020, 01/24/2021, 01/30/2022   Moderna SARS-COV2 Booster Vaccination 04/02/2021   Moderna Sars-Covid-2 Vaccination 04/12/2019, 06/05/2019, 02/15/2020, 09/05/2020   PFIZER(Purple Top)SARS-COV-2 Vaccination 12/26/2020   PPD Test 02/16/2022   Pneumococcal Conjugate-13 02/01/2014   Pneumococcal Polysaccharide-23 12/20/1992, 01/15/2000, 04/08/2004, 06/08/2004   Rsv, Bivalent, Protein Subunit Rsvpref,pf Verdis Frederickson) 04/26/2022   Td 04/08/2002, 04/22/2002   Tdap 04/09/2011, 08/17/2015   Zoster Recombinat (Shingrix) 05/29/2005, 08/27/2017, 06/13/2022   Zoster, Live 04/08/2008, 05/29/2014, 05/28/2017   Zoster, Unspecified 05/29/2005   Pertinent  Health Maintenance Due  Topic Date Due   INFLUENZA VACCINE  11/07/2022   DEXA SCAN  Completed   MAMMOGRAM  Discontinued      02/15/2022    3:00 PM 02/15/2022    9:00 PM 02/16/2022    8:00 AM 03/06/2022    7:25 AM 05/20/2022    3:42 PM  Fall Risk  Falls in the past year?     0  Was there an injury with Fall?     0   Fall Risk Category Calculator     0  (RETIRED) Patient Fall Risk Level Low fall risk Moderate fall risk Moderate fall risk High fall risk   Patient at Risk for Falls Due to     No Fall Risks  Fall risk Follow up     Falls evaluation completed   Functional Status Survey:    Vitals:   09/16/22 1241  BP: 129/75  Pulse: 72  Resp: 19  Temp: 97.6 F (36.4 C)  SpO2: 95%  Weight: 148 lb 8 oz (67.4 kg)   Body mass index is 26.31 kg/m. Physical Exam Vitals and nursing note reviewed.  Constitutional:      Appearance: Normal appearance.  HENT:     Head: Normocephalic and atraumatic.     Nose: Nose normal.     Mouth/Throat:     Mouth: Mucous membranes are moist.  Eyes:     Extraocular Movements: Extraocular movements intact.     Conjunctiva/sclera: Conjunctivae normal.     Right eye: Right conjunctiva is not injected.     Left eye: Left conjunctiva is not injected.     Pupils: Pupils are equal, round, and reactive to light.  Neck:     Comments: Left adam's apple bony aspect, sometimes feels soreness on palpation is chronic, no noted lymph nodes  Cardiovascular:     Rate and Rhythm: Normal rate and regular rhythm.     Heart sounds: Murmur heard.     Comments: PD pulses are not felt from previous  examination.  Pulmonary:     Effort: Pulmonary effort is normal.     Breath sounds: No rales.     Comments: Decreased air entry to both lungs.  Abdominal:     General: Bowel sounds are normal.     Palpations: Abdomen is soft.     Tenderness: There is no abdominal tenderness.     Comments: Mid abd surgical scar. SPC  Genitourinary:    Comments: SPC Musculoskeletal:     Cervical back: Normal range of motion and neck supple.     Right lower leg: No edema.     Left lower leg: No edema.     Comments: Decreased overhead ROM of the left shoulder. Left knee s/p ORIF of the patella fx. Trace edema BLE  Skin:    General: Skin is warm and dry.     Findings: Lesion present.     Comments:  BLE discoloration, chronic venous insufficiency skin changes. Left shin skin lesions, no active bleeding or s/s of infection. Blisters x2 left abd next to umbilicus, one is ruptured, one is about a small marble sized, no s/s of infection.    Neurological:     General: No focal deficit present.     Mental Status: She is alert. Mental status is at baseline.     Gait: Gait abnormal.  Psychiatric:        Mood and Affect: Mood normal.        Behavior: Behavior normal.     Labs reviewed: Recent Labs    02/13/22 0543 02/14/22 0425 02/15/22 0427 02/16/22 0904 02/21/22 0000 05/07/22 0000  NA 141 142 141 139 142 141  K 3.8 3.1* 3.2* 3.4* 3.3* 3.2*  CL 112* 117* 115* 111 108 104  CO2 22 18* 21* 24 27* 29*  GLUCOSE 97 87 100* 100*  --   --   BUN 36* 34* 21 15 11 15   CREATININE 0.87 0.70 0.52 0.60 0.6 0.6  CALCIUM 8.3* 7.9* 8.2* 8.2* 8.1* 8.8  MG 2.3 2.1  --   --   --   --    Recent Labs    11/04/21 1047 12/11/21 0000 05/07/22 0000  AST 20 12* 12*  ALT 12 8 6*  ALKPHOS 157* 137* 100  BILITOT 0.5  --   --   PROT 7.0  --   --   ALBUMIN 3.6 3.0* 3.3*   Recent Labs    12/11/21 0000 02/12/22 1831 02/14/22 0425 02/15/22 0427 02/16/22 0904 02/21/22 0000 05/07/22 0000  WBC 4.0   < > 11.7* 9.3 9.7 6.3 5.0  NEUTROABS 2,352.00  --   --   --   --  4,082.00 2,815.00  HGB 11.7*   < > 9.9* 10.4* 10.9* 10.2* 11.4*  HCT 37   < > 32.6* 34.2* 37.0 32* 36  MCV  --    < > 85.1 84.7 86.9  --   --   PLT 179   < > 218 231 282 329 279   < > = values in this interval not displayed.   Lab Results  Component Value Date   TSH 1.198 02/15/2022   No results found for: "HGBA1C" Lab Results  Component Value Date   CHOL 147 09/27/2021   HDL 48 09/27/2021   LDLCALC 78 09/27/2021   TRIG 131 09/27/2021   CHOLHDL 3.6 06/27/2015    Significant Diagnostic Results in last 30 days:  No results found.  Assessment/Plan: Stye external Left upper eyelid, compress 28min/each,  qid until healed.    Blisters of multiple sites Left abd next to umbilicus, one is ruptured, one is about a small marble sized, no s/s of infection, the patient attributed it to tape used for [facility] dressing.   Skin lesion of left leg Anterior left shin, no active bleeding or s/s of infection, the patient declined dermatology evaluation.   SVT (supraventricular tachycardia) takes Coreg, heart rate is in control.   Hypokalemia K 3.2 05/07/22, placed on Kcl  Suprapubic catheter [facility]) f/u Urology, off Uribel, on Oxybutynin, functioning well.   Restless legs syndrome (RLS) stable, taking MiraPex.     Family/ staff Communication: plan of care reviewed with the patient and charge nurse.   Labs/tests ordered:  none  Time spend 35 minutes.

## 2022-09-16 NOTE — Assessment & Plan Note (Signed)
Left upper eyelid, compress 71min/each, qid until healed.

## 2022-09-17 ENCOUNTER — Encounter: Payer: Self-pay | Admitting: Nurse Practitioner

## 2022-09-17 ENCOUNTER — Non-Acute Institutional Stay (SKILLED_NURSING_FACILITY): Payer: Medicare PPO | Admitting: Nurse Practitioner

## 2022-09-17 DIAGNOSIS — H00014 Hordeolum externum left upper eyelid: Secondary | ICD-10-CM | POA: Diagnosis not present

## 2022-09-17 DIAGNOSIS — I471 Supraventricular tachycardia, unspecified: Secondary | ICD-10-CM | POA: Diagnosis not present

## 2022-09-17 DIAGNOSIS — N132 Hydronephrosis with renal and ureteral calculous obstruction: Secondary | ICD-10-CM

## 2022-09-17 DIAGNOSIS — Z9359 Other cystostomy status: Secondary | ICD-10-CM

## 2022-09-17 DIAGNOSIS — G2581 Restless legs syndrome: Secondary | ICD-10-CM

## 2022-09-17 NOTE — Progress Notes (Unsigned)
Location:  [name] Room Number: NO/29/A Place of Service:  SNF (31) Provider:  Senon Nixon X, NP  Patient Care Team: Marten Iles X, NP as PCP - General (Internal Medicine) Ernesto Rutherford, MD as Consulting Physician (Ophthalmology) Jake Bathe, MD as Consulting Physician (Cardiology) Venancio Poisson, MD as Consulting Physician (Dermatology) Ranee Gosselin, MD as Consulting Physician (Orthopedic Surgery) Eugenia Mcalpine, MD as Consulting Physician (Orthopedic Surgery) Bradly Bienenstock, MD as Consulting Physician (Orthopedic Surgery) [name] Nevin Grizzle X, NP as Nurse Practitioner (Nurse Practitioner) York Spaniel, MD (Inactive) as Consulting Physician (Neurology)  Extended Emergency Contact Information Primary Emergency Contact: [name],[name] Address: [address]          [address]          [place], [place] [address] [place] of [place] Home Phone: [phone] Relation: Sister Secondary Emergency Contact: [place] Home Phone: [phone] Mobile Phone: [phone] Relation: Sister  Code Status:  DNR Goals of care: Advanced Directive information    08/08/2022    8:41 AM  Advanced Directives  Does Patient Have a Medical Advance Directive? Yes  Type of Advance Directive Out of facility DNR (pink MOST or yellow form)  Does patient want to make changes to medical advance directive? No - Patient declined  Pre-existing out of facility DNR order (yellow form or pink MOST form) Pink MOST form placed in chart (order not valid for inpatient use);Yellow form placed in chart (order not valid for inpatient use)     Chief Complaint  Patient presents with   Acute Visit    Patient is being seen for back pain    HPI:  Pt is a 89 y.o. female seen today for an acute visit for right mid back pain, the patient stated it was where when she had kidney stones attacks, comes and goes, no pain today.      The left  upper eyelid stye, left abd next to umbilicus blisters, anterior LLE skin lesions.                  02/12/22 Obstructive nephritis, underwent cystoscopy with right retrograde pyelogram, right ureteral stent placed, f/u Urology Dr. Berneice Heinrich, 03/06/22 cystoscopy with retrograde pyelogram, ureteroscopy and stent replacement, SPC exchange.                05/03/22 abd [place] no renal or gallbladder stone or hydronephrosis. 05/02/22 Xray abd may represent ileus. Hx of Ureteral stone with hydronephrosis per CT abd                          05/13/22 Urology: R ureteral stone, sp R ureteroscopy stone free 03/2022, stent. SP  urethral stricture, on Oxybutynin. Hx of Ureteral stone with hydronephrosis per CT abd               Hypokalemia, K 3.2 05/07/22, placed on Kcl             Anemia, Hgb 11.4 05/07/22             SVT, takes Coreg             BLE edema, not apparent, off Furosemide.              Adult failure to thrive, under Hospice service.              Suprapubic Catheter, f/u Urology, off Uribel, on Oxybutynin.  Depression, stable, off Lorazepam             Dementia, under hospice service             Restless leg syndrome, stable, taking MiraPex.              Hx of seizures, no active seizures, off meds.              COPD, on ProAir, Albuterol neb.   Past Medical History:  Diagnosis Date   Acute bronchitis 05/23/2011   Acute upper respiratory infections of unspecified site 05/23/2011   Arthritis    Chronic airway obstruction, not elsewhere classified 05/23/2011   Coronary artery disease    Disturbance of salivary secretion 01/31/2011   Dizziness and giddiness 01/31/2011   Dyspnea    Essential tremor 04/25/2014   External hemorrhoids without mention of complication 01/31/2011   Gait disorder 04/25/2014   GERD (gastroesophageal reflux disease)    History of kidney stones    Insomnia, unspecified 09/12/2011   Lumbago 01/31/2011   Major depressive disorder, single episode, unspecified  01/31/2011   Memory disorder 04/25/2014   Mitral valve disorders(424.0) 01/31/2011   Other and unspecified hyperlipidemia 01/31/2011   Other convulsions 01/31/2011   Other emphysema (HCC) 01/31/2011   Pain in joint, site unspecified 01/31/2011   Restless legs syndrome (RLS) 09/12/2011   Retinal detachment with retinal defect of right eye 2011   right eye twice   Seizure disorder (HCC)    Senile osteoporosis 01/31/2011   Spontaneous ecchymoses 01/31/2011   Squamous cell carcinoma of leg    Stiffness of joints, not elsewhere classified, multiple sites 01/31/2011   Unspecified essential hypertension 01/31/2011   Past Surgical History:  Procedure Laterality Date   ABDOMINAL HYSTERECTOMY  06/21/2003   TAH/BSO, omenectomy PSB resect, Stg IC cystadenofibroma   CHOLECYSTECTOMY  2005   Dr. Carolynne Edouard   CYSTOSCOPY W/ URETERAL STENT PLACEMENT Right 02/13/2022   Procedure: CYSTOSCOPY WITH RETROGRADE PYELOGRAM/URETERAL STENT PLACEMENT, AND SUPRAPUBIC TUBE EXCHANGE;  Surgeon: Sebastian Ache, MD;  Location: [facility];  Service: Urology;  Laterality: Right;   CYSTOSCOPY WITH RETROGRADE PYELOGRAM, URETEROSCOPY AND STENT PLACEMENT Right 03/06/2022   Procedure: CYSTOSCOPY WITH RETROGRADE PYELOGRAM, URETEROSCOPY AND STENT REPLACEMENT, SUPRAPUBIC CATHETER EXCHANGE;  Surgeon: Sebastian Ache, MD;  Location: [facility];  Service: Urology;  Laterality: Right;   ELBOW SURGERY Right 2008   broken   Dr. Orlan Leavens   EYE SURGERY     HOLMIUM LASER APPLICATION Right 03/06/2022   Procedure: HOLMIUM LASER APPLICATION;  Surgeon: Sebastian Ache, MD;  Location: [facility];  Service: Urology;  Laterality: Right;   INTRAMEDULLARY (IM) NAIL INTERTROCHANTERIC Left 08/30/2021   Procedure: INTRAMEDULLARY (IM) NAIL INTERTROCHANTRIC;  Surgeon: Samson Frederic, MD;  Location: [facility];  Service: Orthopedics;  Laterality: Left;   ORIF PATELLA Left 05/02/2020   Procedure: OPEN REDUCTION INTERNAL (ORIF) FIXATION LEFT PATELLA WITH MEDIAL AND LATERAL  LIGAMENT REINFORCEMENTS;  Surgeon: Sheral Apley, MD;  Location: [facility];  Service: Orthopedics;  Laterality: Left;   ORIF PATELLA Left 05/30/2020   Procedure: OPEN REDUCTION INTERNAL (ORIF) FIXATION PATELLA;  Surgeon: Sheral Apley, MD;  Location: [facility];  Service: Orthopedics;  Laterality: Left;   RETINAL DETACHMENT SURGERY N/A    two   REVERSE SHOULDER ARTHROPLASTY Left 05/06/2019   Procedure: REVERSE SHOULDER ARTHROPLASTY;  Surgeon: Francena Hanly, MD;  Location: [facility];  Service: Orthopedics;  Laterality: Left;    ROTATOR CUFF REPAIR Right 2012   Dr. Thomasena Edis  SQUAMOUS CELL CARCINOMA EXCISION Bilateral 2012, 8/14   [place] on legs   Dr. Irene Limbo   TONSILLECTOMY  1941   VIDEO BRONCHOSCOPY WITH ENDOBRONCHIAL NAVIGATION N/A 11/29/2015   Procedure: VIDEO BRONCHOSCOPY WITH ENDOBRONCHIAL NAVIGATION;  Surgeon: Leslye Peer, MD;  Location: [facility];  Service: Thoracic;  Laterality: N/A;    Allergies  Allergen Reactions   Vioxx [Rofecoxib] Shortness Of Breath   Dyazide [Hydrochlorothiazide W-Triamterene] Other (See Comments)    Lowers blood pressure too much   Latex Swelling and Other (See Comments)    "Allergic," per [facility]   Sulfa Antibiotics Nausea And Vomiting and Other (See Comments)    "Allergic," per [facility]   Sumycin [Tetracycline] Other (See Comments)    Can't take due to drug reaction- "Allergic," per [facility]    Outpatient Encounter Medications as of 09/17/2022  Medication Sig   acetaminophen (TYLENOL) 325 MG tablet Take 650 mg by mouth every 6 (six) hours as needed for fever or mild pain. Give 2 tablet by mouth every 6 hours as needed for Pain   albuterol (ACCUNEB) 1.25 MG/3ML nebulizer solution Take 1 ampule by nebulization every 6 (six) hours as needed for shortness of breath.   Albuterol Sulfate (PROAIR RESPICLICK) 108 (90 Base) MCG/ACT AEPB Inhale 2 puffs into the lungs daily as needed (shortness of breath or wheezing).   carvedilol (COREG) 3.125 MG tablet Take 1 tablet (3.125  mg total) by mouth 2 (two) times daily with a meal.   Emollient (LUBRIDERM DAILY MOISTURE) LOTN Apply to left lower leg topically three times daily to rash, mix with triamcinolone 0/1% cream   fluticasone (VERAMYST) 27.5 MCG/SPRAY nasal spray Place 1 spray into the nose daily.   ketoconazole (NIZORAL) 2 % shampoo Apply 1 application  topically See admin instructions. Apply topically to scalp on Mondays and Thursdays for hair care   oxybutynin (DITROPAN) 5 MG tablet Take 1 tablet (5 mg total) by mouth 3 (three) times daily.   potassium chloride (MICRO-K) 10 MEQ CR capsule Take 10 mEq by mouth daily.   pramipexole (MIRAPEX) 0.5 MG tablet Take 0.5 mg by mouth at bedtime.   Sodium Chloride Flush (NORMAL SALINE FLUSH) 0.9 % SOLN Inject 30 mLs into the vein as directed. Monday, Wednesday, Thursday, Friday and Sunday   triamcinolone (KENALOG) 0.1 % Apply 1 application. topically daily as needed (psoriasis).   No facility-administered encounter medications on file as of 09/17/2022.    Review of Systems  Constitutional:  Negative for appetite change, fatigue and fever.  HENT:  Positive for hearing loss. Negative for congestion and trouble swallowing.        C/o lower gum pain  Eyes:  Negative for visual disturbance.       Left upper eyelid stye  Respiratory:  Negative for cough and shortness of breath.        DOE is chronic  Cardiovascular:  Negative for leg swelling.  Gastrointestinal:  Negative for abdominal pain and constipation.       Acid reflux symptoms.   Genitourinary:  Positive for difficulty urinating. Negative for hematuria.       SPC, right mid back pain lasted a few hours 2 days ago.   Musculoskeletal:  Positive for arthralgias and gait problem.       S/p left hip ORIF, ORIF of the left patella.   Skin:  Negative for color change.       BLE discoloration, chronic venous insufficiency skin changes. Left shin skin lesions. Left abd blisters.  Neurological:  Negative for seizures,  weakness and headaches.       Memory lapses. Hx of seizures. RLS  Psychiatric/Behavioral:  Positive for sleep disturbance. Negative for behavioral problems. The patient is not nervous/anxious.     Immunization History  Administered Date(s) Administered   Influenza Split 01/06/2014, 01/15/2017, 01/08/2018, 12/09/2018   Influenza Whole 01/07/2012, 01/06/2013   Influenza, High Dose Seasonal PF 12/25/2015, 01/20/2017   Influenza,inj,Quad PF,6+ Mos 12/21/2014   Influenza-Unspecified 12/09/2018, 01/18/2020, 01/24/2021, 01/30/2022   Moderna SARS-COV2 Booster Vaccination 04/02/2021   Moderna Sars-Covid-2 Vaccination 04/12/2019, 06/05/2019, 02/15/2020, 09/05/2020   PFIZER(Purple Top)SARS-COV-2 Vaccination 12/26/2020   PPD Test 02/16/2022   Pneumococcal Conjugate-13 02/01/2014   Pneumococcal Polysaccharide-23 12/20/1992, 01/15/2000, 04/08/2004, 06/08/2004   Rsv, Bivalent, Protein Subunit Rsvpref,pf Verdis Frederickson) 04/26/2022   Td 04/08/2002, 04/22/2002   Tdap 04/09/2011, 08/17/2015   Zoster Recombinat (Shingrix) 05/29/2005, 08/27/2017, 06/13/2022   Zoster, Live 04/08/2008, 05/29/2014, 05/28/2017   Zoster, Unspecified 05/29/2005   Pertinent  Health Maintenance Due  Topic Date Due   INFLUENZA VACCINE  11/07/2022   DEXA SCAN  Completed   MAMMOGRAM  Discontinued      02/15/2022    3:00 PM 02/15/2022    9:00 PM 02/16/2022    8:00 AM 03/06/2022    7:25 AM 05/20/2022    3:42 PM  Fall Risk  Falls in the past year?     0  Was there an injury with Fall?     0  Fall Risk Category Calculator     0  (RETIRED) Patient Fall Risk Level Low fall risk Moderate fall risk Moderate fall risk High fall risk   Patient at Risk for Falls Due to     No Fall Risks  Fall risk Follow up     Falls evaluation completed   Functional Status Survey:    Vitals:   09/17/22 1442  BP: 122/69  Pulse: 86  Resp: 19  Temp: 97.6 F (36.4 C)  SpO2: 95%  Weight: 148 lb 8 oz (67.4 kg)  Height: 5\' 3"  (1.6 m)   Body mass  index is 26.31 kg/m. Physical Exam Vitals and nursing note reviewed.  Constitutional:      Appearance: Normal appearance.  HENT:     Head: Normocephalic and atraumatic.     Nose: Nose normal.     Mouth/Throat:     Mouth: Mucous membranes are moist.  Eyes:     Extraocular Movements: Extraocular movements intact.     Conjunctiva/sclera: Conjunctivae normal.     Right eye: Right conjunctiva is not injected.     Left eye: Left conjunctiva is not injected.     Pupils: Pupils are equal, round, and reactive to light.  Neck:     Comments: Left adam's apple bony aspect, sometimes feels soreness on palpation is chronic, no noted lymph nodes  Cardiovascular:     Rate and Rhythm: Normal rate and regular rhythm.     Heart sounds: Murmur heard.     Comments: PD pulses are not felt from previous examination.  Pulmonary:     Effort: Pulmonary effort is normal.     Breath sounds: No rales.     Comments: Decreased air entry to both lungs.  Abdominal:     General: Bowel sounds are normal.     Palpations: Abdomen is soft.     Tenderness: There is no abdominal tenderness.     Comments: Mid abd surgical scar. SPC  Genitourinary:    Comments: SPC Musculoskeletal:  Cervical back: Normal range of motion and neck supple.     Right lower leg: No edema.     Left lower leg: No edema.     Comments: Decreased overhead ROM of the left shoulder. Left knee s/p ORIF of the patella fx. Trace edema BLE  Skin:    General: Skin is warm and dry.     Findings: Lesion present.     Comments: BLE discoloration, chronic venous insufficiency skin changes. Left shin skin lesions-opted out workup or tx.    Neurological:     General: No focal deficit present.     Mental Status: She is alert. Mental status is at baseline.     Gait: Gait abnormal.  Psychiatric:        Mood and Affect: Mood normal.        Behavior: Behavior normal.     Labs reviewed: Recent Labs    02/13/22 0543 02/14/22 0425 02/15/22 0427  02/16/22 0904 02/21/22 0000 05/07/22 0000  NA 141 142 141 139 142 141  K 3.8 3.1* 3.2* 3.4* 3.3* 3.2*  CL 112* 117* 115* 111 108 104  CO2 22 18* 21* 24 27* 29*  GLUCOSE 97 87 100* 100*  --   --   BUN 36* 34* 21 15 11 15   CREATININE 0.87 0.70 0.52 0.60 0.6 0.6  CALCIUM 8.3* 7.9* 8.2* 8.2* 8.1* 8.8  MG 2.3 2.1  --   --   --   --    Recent Labs    11/04/21 1047 12/11/21 0000 05/07/22 0000  AST 20 12* 12*  ALT 12 8 6*  ALKPHOS 157* 137* 100  BILITOT 0.5  --   --   PROT 7.0  --   --   ALBUMIN 3.6 3.0* 3.3*   Recent Labs    12/11/21 0000 02/12/22 1831 02/14/22 0425 02/15/22 0427 02/16/22 0904 02/21/22 0000 05/07/22 0000  WBC 4.0   < > 11.7* 9.3 9.7 6.3 5.0  NEUTROABS 2,352.00  --   --   --   --  4,082.00 2,815.00  HGB 11.7*   < > 9.9* 10.4* 10.9* 10.2* 11.4*  HCT 37   < > 32.6* 34.2* 37.0 32* 36  MCV  --    < > 85.1 84.7 86.9  --   --   PLT 179   < > 218 231 282 329 279   < > = values in this interval not displayed.   Lab Results  Component Value Date   TSH 1.198 02/15/2022   No results found for: "HGBA1C" Lab Results  Component Value Date   CHOL 147 09/27/2021   HDL 48 09/27/2021   LDLCALC 78 09/27/2021   TRIG 131 09/27/2021   CHOLHDL 3.6 06/27/2015    Significant Diagnostic Results in last 30 days:  No results found.  Assessment/Plan Ureteral stone with hydronephrosis  05/13/22 Urology: R ureteral stone, sp R ureteroscopy stone free 03/2022, stent. SP  urethral stricture, on Oxybutynin. Hx of Ureteral stone with hydronephrosis per CT abd 09/16/22  right mid back pain, the patient stated it was where when she had kidney stones attacks, comes and goes, no pain today.  09/18/22 will have prn Morphine available to her for pain management. Observe.   Stye external  The left upper eyelid stye, left abd next to umbilicus blisters, anterior LLE skin lesions.    Suprapubic catheter (HCC) 02/12/22 Obstructive nephritis, underwent cystoscopy with right retrograde  pyelogram, right ureteral stent placed, f/u Urology Dr. Berneice Heinrich,  03/06/22 cystoscopy with retrograde pyelogram, ureteroscopy and stent replacement, SPC exchange.   SVT (supraventricular tachycardia) Heart rate is in control,  takes Futures trader Communication: plan of care reviewed with the patient and charge nurse.   Labs/tests ordered:  none  Time spend 25 minutes.

## 2022-09-18 ENCOUNTER — Encounter: Payer: Self-pay | Admitting: Nurse Practitioner

## 2022-09-18 NOTE — Assessment & Plan Note (Signed)
stable, taking MiraPex.  

## 2022-09-18 NOTE — Assessment & Plan Note (Signed)
02/12/22 Obstructive nephritis, underwent cystoscopy with right retrograde pyelogram, right ureteral stent placed, f/u Urology Dr. Berneice Heinrich, 03/06/22 cystoscopy with retrograde pyelogram, ureteroscopy and stent replacement, SPC exchange.

## 2022-09-18 NOTE — Assessment & Plan Note (Signed)
Heart rate is in control, takes Coreg 

## 2022-09-18 NOTE — Assessment & Plan Note (Signed)
05/13/22 Urology: R ureteral stone, sp R ureteroscopy stone free 03/2022, stent. SP  urethral stricture, on Oxybutynin. Hx of Ureteral stone with hydronephrosis per CT abd 09/16/22  right mid back pain, the patient stated it was where when she had kidney stones attacks, comes and goes, no pain today.  09/18/22 will have prn Morphine available to her for pain management. Observe.

## 2022-09-18 NOTE — Assessment & Plan Note (Signed)
The left upper eyelid stye, left abd next to umbilicus blisters, anterior LLE skin lesions.

## 2022-09-24 ENCOUNTER — Non-Acute Institutional Stay (SKILLED_NURSING_FACILITY): Admitting: Family Medicine

## 2022-09-24 DIAGNOSIS — F0390 Unspecified dementia without behavioral disturbance: Secondary | ICD-10-CM

## 2022-09-24 DIAGNOSIS — R4189 Other symptoms and signs involving cognitive functions and awareness: Secondary | ICD-10-CM

## 2022-09-24 DIAGNOSIS — R4689 Other symptoms and signs involving appearance and behavior: Secondary | ICD-10-CM

## 2022-09-24 DIAGNOSIS — R627 Adult failure to thrive: Secondary | ICD-10-CM | POA: Diagnosis not present

## 2022-09-24 DIAGNOSIS — F323 Major depressive disorder, single episode, severe with psychotic features: Secondary | ICD-10-CM

## 2022-09-24 DIAGNOSIS — J431 Panlobular emphysema: Secondary | ICD-10-CM | POA: Diagnosis not present

## 2022-09-24 DIAGNOSIS — I10 Essential (primary) hypertension: Secondary | ICD-10-CM

## 2022-09-24 DIAGNOSIS — R269 Unspecified abnormalities of gait and mobility: Secondary | ICD-10-CM

## 2022-09-24 NOTE — Progress Notes (Signed)
Provider:  Jacalyn Lefevre, MD Location:      Place of Service:     PCP: Mast, Man X, NP Patient Care Team: Mast, Man X, NP as PCP - General (Internal Medicine) Ernesto Rutherford, MD as Consulting Physician (Ophthalmology) Jake Bathe, MD as Consulting Physician (Cardiology) Venancio Poisson, MD as Consulting Physician (Dermatology) Ranee Gosselin, MD as Consulting Physician (Orthopedic Surgery) Eugenia Mcalpine, MD as Consulting Physician (Orthopedic Surgery) Bradly Bienenstock, MD as Consulting Physician (Orthopedic Surgery) [name] Mast, Man X, NP as Nurse Practitioner (Nurse Practitioner) York Spaniel, MD (Inactive) as Consulting Physician (Neurology)  Extended Emergency Contact Information Primary Emergency Contact: [name],[name] Address: [address]          [address]          [place], [place] [address] [place] Home Phone: [phone] Relation: Sister Secondary Emergency Contact: Rocky Link [place] Home Phone: [phone] Mobile Phone: [phone] Relation: Sister  Code Status:  Goals of Care: Advanced Directive information    08/08/2022    8:41 AM  Advanced Directives  Does Patient Have a Medical Advance Directive? Yes  Type of Advance Directive Out of facility DNR (pink MOST or yellow form)  Does patient want to make changes to medical advance directive? No - Patient declined  Pre-existing out of facility DNR order (yellow form or pink MOST form) Pink MOST form placed in chart (order not valid for inpatient use);Yellow form placed in chart (order not valid for inpatient use)      No chief complaint on file.   HPI: Patient is a 89 y.o. female seen today for medical management of chronic problems including recurring urinary stones, dementia without behavioral disturbance, COPD, restless leg syndrome, and seizure disorder. Angelica Chessman continues to do well except for the flank pain and problems related to her urinary  tract.  She is well adjusted to living in this environment and I personally have not seen any decline in her cognitive abilities since I have known her.  She is not on any medications for memory or behaviors.  She does take Mirapex for restless legs and albuterol for her COPD.  Her weight is very but generally appetite is good and physically she does not appear to be losing weight.  Past Medical History:  Diagnosis Date   Acute bronchitis 05/23/2011   Acute upper respiratory infections of unspecified site 05/23/2011   Arthritis    Chronic airway obstruction, not elsewhere classified 05/23/2011   Coronary artery disease    Disturbance of salivary secretion 01/31/2011   Dizziness and giddiness 01/31/2011   Dyspnea    Essential tremor 04/25/2014   External hemorrhoids without mention of complication 01/31/2011   Gait disorder 04/25/2014   GERD (gastroesophageal reflux disease)    History of kidney stones    Insomnia, unspecified 09/12/2011   Lumbago 01/31/2011   Major depressive disorder, single episode, unspecified 01/31/2011   Memory disorder 04/25/2014   Mitral valve disorders(424.0) 01/31/2011   Other and unspecified hyperlipidemia 01/31/2011   Other convulsions 01/31/2011   Other emphysema (HCC) 01/31/2011   Pain in joint, site unspecified 01/31/2011   Restless legs syndrome (RLS) 09/12/2011   Retinal detachment with retinal defect of right eye 2011   right eye twice   Seizure disorder (HCC)    Senile osteoporosis 01/31/2011   Spontaneous ecchymoses 01/31/2011   Squamous cell carcinoma of leg    Stiffness of joints, not elsewhere classified, multiple sites 01/31/2011   Unspecified essential  hypertension 01/31/2011   Past Surgical History:  Procedure Laterality Date   ABDOMINAL HYSTERECTOMY  06/21/2003   TAH/BSO, omenectomy PSB resect, Stg IC cystadenofibroma   CHOLECYSTECTOMY  2005   Dr. Carolynne Edouard   CYSTOSCOPY W/ URETERAL STENT PLACEMENT Right 02/13/2022   Procedure: CYSTOSCOPY  WITH RETROGRADE PYELOGRAM/URETERAL STENT PLACEMENT, AND SUPRAPUBIC TUBE EXCHANGE;  Surgeon: Sebastian Ache, MD;  Location: [facility];  Service: Urology;  Laterality: Right;   CYSTOSCOPY WITH RETROGRADE PYELOGRAM, URETEROSCOPY AND STENT PLACEMENT Right 03/06/2022   Procedure: CYSTOSCOPY WITH RETROGRADE PYELOGRAM, URETEROSCOPY AND STENT REPLACEMENT, SUPRAPUBIC CATHETER EXCHANGE;  Surgeon: Sebastian Ache, MD;  Location: [facility];  Service: Urology;  Laterality: Right;   ELBOW SURGERY Right 2008   broken   Dr. Orlan Leavens   EYE SURGERY     HOLMIUM LASER APPLICATION Right 03/06/2022   Procedure: HOLMIUM LASER APPLICATION;  Surgeon: Sebastian Ache, MD;  Location: [facility];  Service: Urology;  Laterality: Right;   INTRAMEDULLARY (IM) NAIL INTERTROCHANTERIC Left 08/30/2021   Procedure: INTRAMEDULLARY (IM) NAIL INTERTROCHANTRIC;  Surgeon: Samson Frederic, MD;  Location: [facility];  Service: Orthopedics;  Laterality: Left;   ORIF PATELLA Left 05/02/2020   Procedure: OPEN REDUCTION INTERNAL (ORIF) FIXATION LEFT PATELLA WITH MEDIAL AND LATERAL LIGAMENT REINFORCEMENTS;  Surgeon: Sheral Apley, MD;  Location: [facility];  Service: Orthopedics;  Laterality: Left;   ORIF PATELLA Left 05/30/2020   Procedure: OPEN REDUCTION INTERNAL (ORIF) FIXATION PATELLA;  Surgeon: Sheral Apley, MD;  Location: [facility];  Service: Orthopedics;  Laterality: Left;   RETINAL DETACHMENT SURGERY N/A    two   REVERSE SHOULDER ARTHROPLASTY Left 05/06/2019   Procedure: REVERSE SHOULDER ARTHROPLASTY;  Surgeon: Francena Hanly, MD;  Location: [facility];  Service: Orthopedics;  Laterality: Left;    ROTATOR CUFF REPAIR Right 2012   Dr. Thomasena Edis   SQUAMOUS CELL CARCINOMA EXCISION Bilateral 2012, 8/14   [place] on legs   Dr. Irene Limbo   TONSILLECTOMY  1941   VIDEO BRONCHOSCOPY WITH ENDOBRONCHIAL NAVIGATION N/A 11/29/2015   Procedure: VIDEO BRONCHOSCOPY WITH ENDOBRONCHIAL NAVIGATION;  Surgeon: Leslye Peer, MD;  Location: [facility];  Service: Thoracic;   Laterality: N/A;    reports that she quit smoking about 34 years ago. Her smoking use included cigarettes. She has a 40.00 pack-year smoking history. She has never used smokeless tobacco. She reports that she does not drink alcohol and does not use drugs. Social History   Socioeconomic History   Marital status: Divorced    Spouse name: Not on file   Number of children: 0   Years of education: Not on file   Highest education level: Not on file  Occupational History   Occupation: retired Programmer, systems for special students  Tobacco Use   Smoking status: Former    Packs/day: 1.00    Years: 40.00    Additional pack years: 0.00    Total pack years: 40.00    Types: Cigarettes    Quit date: 04/08/1988    Years since quitting: 34.4   Smokeless tobacco: Never   Tobacco comments:    quit in 1990  Vaping Use   Vaping Use: Never used  Substance and Sexual Activity   Alcohol use: No    Alcohol/week: 0.0 standard drinks of alcohol   Drug use: No   Sexual activity: Never  Other Topics Concern   Not on file  Social History Narrative   Lives at [facility] [name]   No children   Divorced   Exercise climbs steps 4 flights  daily PT for balance   Alcohol none   Stopped smoking 1989   Patient drinks 3 large sodas daily.   Patient is right handed.   Social Determinants of Health   Financial Resource Strain: Not on file  Food Insecurity: No Food Insecurity (02/12/2022)   Hunger Vital Sign    Worried About Running Out of Food in the Last Year: Never true    Ran Out of Food in the Last Year: Never true  Transportation Needs: No Transportation Needs (02/12/2022)   PRAPARE - Administrator, Civil Service (Medical): No    Lack of Transportation (Non-Medical): No  Physical Activity: Not on file  Stress: Not on file  Social Connections: Not on file  Intimate Partner Violence: Not At Risk (02/12/2022)   Humiliation, Afraid, Rape, and Kick questionnaire    Fear of Current or  Ex-Partner: No    Emotionally Abused: No    Physically Abused: No    Sexually Abused: No    Functional Status Survey:    Family History  Problem Relation Age of Onset   Heart disease Father        CHF   Cancer Mother        breast   Seizures Sister     Health Maintenance  Topic Date Due   INFLUENZA VACCINE  11/07/2022   DTaP/Tdap/Td (5 - Td or Tdap) 08/16/2025   Pneumonia Vaccine 50+ Years old  Completed   DEXA SCAN  Completed   Zoster Vaccines- Shingrix  Completed   HPV VACCINES  Aged Out   MAMMOGRAM  Discontinued   COVID-19 Vaccine  Discontinued    Allergies  Allergen Reactions   Vioxx [Rofecoxib] Shortness Of Breath   Dyazide [Hydrochlorothiazide W-Triamterene] Other (See Comments)    Lowers blood pressure too much   Latex Swelling and Other (See Comments)    "Allergic," per [facility]   Sulfa Antibiotics Nausea And Vomiting and Other (See Comments)    "Allergic," per [facility]   Sumycin [Tetracycline] Other (See Comments)    Can't take due to drug reaction- "Allergic," per [facility]    Outpatient Encounter Medications as of 09/24/2022  Medication Sig   acetaminophen (TYLENOL) 325 MG tablet Take 650 mg by mouth every 6 (six) hours as needed for fever or mild pain. Give 2 tablet by mouth every 6 hours as needed for Pain   albuterol (ACCUNEB) 1.25 MG/3ML nebulizer solution Take 1 ampule by nebulization every 6 (six) hours as needed for shortness of breath.   Albuterol Sulfate (PROAIR RESPICLICK) 108 (90 Base) MCG/ACT AEPB Inhale 2 puffs into the lungs daily as needed (shortness of breath or wheezing).   carvedilol (COREG) 3.125 MG tablet Take 1 tablet (3.125 mg total) by mouth 2 (two) times daily with a meal.   Emollient (LUBRIDERM DAILY MOISTURE) LOTN Apply to left lower leg topically three times daily to rash, mix with triamcinolone 0/1% cream   fluticasone (VERAMYST) 27.5 MCG/SPRAY nasal spray Place 1 spray into the nose daily.   ketoconazole (NIZORAL) 2 % shampoo Apply 1  application  topically See admin instructions. Apply topically to scalp on Mondays and Thursdays for hair care   oxybutynin (DITROPAN) 5 MG tablet Take 1 tablet (5 mg total) by mouth 3 (three) times daily.   potassium chloride (MICRO-K) 10 MEQ CR capsule Take 10 mEq by mouth daily.   pramipexole (MIRAPEX) 0.5 MG tablet Take 0.5 mg by mouth at bedtime.   Sodium Chloride Flush (NORMAL SALINE FLUSH) 0.9 %  SOLN Inject 30 mLs into the vein as directed. Monday, Wednesday, Thursday, Friday and Sunday   triamcinolone (KENALOG) 0.1 % Apply 1 application. topically daily as needed (psoriasis).   No facility-administered encounter medications on file as of 09/24/2022.    Review of Systems  Constitutional: Negative.   HENT: Negative.    Respiratory: Negative.    Cardiovascular: Negative.   Genitourinary:  Positive for flank pain.  Neurological: Negative.   Psychiatric/Behavioral: Negative.      There were no vitals filed for this visit. There is no height or weight on file to calculate BMI. Physical Exam Vitals and nursing note reviewed.  Constitutional:      Appearance: Normal appearance.  HENT:     Mouth/Throat:     Mouth: Mucous membranes are moist.     Pharynx: Oropharynx is clear.  Eyes:     Extraocular Movements: Extraocular movements intact.     Pupils: Pupils are equal, round, and reactive to light.  Cardiovascular:     Rate and Rhythm: Normal rate.     Heart sounds: Murmur heard.  Pulmonary:     Effort: Pulmonary effort is normal.     Breath sounds: Normal breath sounds.  Abdominal:     General: Bowel sounds are normal.     Palpations: Abdomen is soft.  Musculoskeletal:     Comments: Moves about the facility in wheelchair  Skin:    General: Skin is warm and dry.  Neurological:     General: No focal deficit present.     Mental Status: She is alert and oriented to person, place, and time.  Psychiatric:        Mood and Affect: Mood normal.        Behavior: Behavior normal.         Thought Content: Thought content normal.        Judgment: Judgment normal.    Labs reviewed: Basic Metabolic Panel: Recent Labs    02/13/22 0543 02/14/22 0425 02/15/22 0427 02/16/22 0904 02/21/22 0000 05/07/22 0000  NA 141 142 141 139 142 141  K 3.8 3.1* 3.2* 3.4* 3.3* 3.2*  CL 112* 117* 115* 111 108 104  CO2 22 18* 21* 24 27* 29*  GLUCOSE 97 87 100* 100*  --   --   BUN 36* 34* 21 15 11 15   CREATININE 0.87 0.70 0.52 0.60 0.6 0.6  CALCIUM 8.3* 7.9* 8.2* 8.2* 8.1* 8.8  MG 2.3 2.1  --   --   --   --    Liver Function Tests: Recent Labs    11/04/21 1047 12/11/21 0000 05/07/22 0000  AST 20 12* 12*  ALT 12 8 6*  ALKPHOS 157* 137* 100  BILITOT 0.5  --   --   PROT 7.0  --   --   ALBUMIN 3.6 3.0* 3.3*   No results for input(s): "LIPASE", "AMYLASE" in the last 8760 hours. No results for input(s): "AMMONIA" in the last 8760 hours. CBC: Recent Labs    12/11/21 0000 02/12/22 1831 02/14/22 0425 02/15/22 0427 02/16/22 0904 02/21/22 0000 05/07/22 0000  WBC 4.0   < > 11.7* 9.3 9.7 6.3 5.0  NEUTROABS 2,352.00  --   --   --   --  4,082.00 2,815.00  HGB 11.7*   < > 9.9* 10.4* 10.9* 10.2* 11.4*  HCT 37   < > 32.6* 34.2* 37.0 32* 36  MCV  --    < > 85.1 84.7 86.9  --   --  PLT 179   < > 218 231 282 329 279   < > = values in this interval not displayed.   Cardiac Enzymes: No results for input(s): "CKTOTAL", "CKMB", "CKMBINDEX", "TROPONINI" in the last 8760 hours. BNP: Invalid input(s): "POCBNP" No results found for: "HGBA1C" Lab Results  Component Value Date   TSH 1.198 02/15/2022   Lab Results  Component Value Date   VITAMINB12 249 09/05/2021   Lab Results  Component Value Date   FOLATE 23.1 09/05/2021   Lab Results  Component Value Date   IRON 64 09/05/2021   TIBC 369 09/05/2021   FERRITIN 31 09/05/2021    Imaging and Procedures obtained prior to SNF admission: DG C-Arm 1-60 Min-No Report  Result Date: 03/06/2022 Fluoroscopy was utilized by the  requesting physician.  No radiographic interpretation.   DG C-Arm 1-60 Min-No Report  Result Date: 03/06/2022 Fluoroscopy was utilized by the requesting physician.  No radiographic interpretation.    Assessment/Plan 1. Adult failure to thrive Appetite is good and weight seems stable  2. Cognitive and behavioral changes No changes noted since last visit  3. Panlobular emphysema (HCC) Stable on ProAir and albuterol nebs  4. Dementia without behavioral disturbance (HCC) If patient had delirium at 1 time it may have been related to some depression with psychotic features but she does not appear to have dementia to me.  5. Depression, psychotic (HCC) Off all medicines for depression and/or psychosis  6. Essential hypertension She continues to take carvedilol low-dose twice daily  7. Gait disorder Although she states she can walk in her room with walker she usually uses wheelchair to go to the dining room living room etc.    Family/ staff Communication:   Labs/tests ordered:  .smmsig

## 2022-10-16 ENCOUNTER — Encounter: Payer: Self-pay | Admitting: Nurse Practitioner

## 2022-10-16 ENCOUNTER — Non-Acute Institutional Stay (SKILLED_NURSING_FACILITY): Payer: Medicare PPO | Admitting: Nurse Practitioner

## 2022-10-16 DIAGNOSIS — E876 Hypokalemia: Secondary | ICD-10-CM | POA: Diagnosis not present

## 2022-10-16 DIAGNOSIS — I471 Supraventricular tachycardia, unspecified: Secondary | ICD-10-CM | POA: Diagnosis not present

## 2022-10-16 DIAGNOSIS — D638 Anemia in other chronic diseases classified elsewhere: Secondary | ICD-10-CM | POA: Diagnosis not present

## 2022-10-16 DIAGNOSIS — R609 Edema, unspecified: Secondary | ICD-10-CM

## 2022-10-16 DIAGNOSIS — Z9359 Other cystostomy status: Secondary | ICD-10-CM

## 2022-10-16 DIAGNOSIS — F0390 Unspecified dementia without behavioral disturbance: Secondary | ICD-10-CM

## 2022-10-16 DIAGNOSIS — R569 Unspecified convulsions: Secondary | ICD-10-CM

## 2022-10-16 DIAGNOSIS — G2581 Restless legs syndrome: Secondary | ICD-10-CM

## 2022-10-16 DIAGNOSIS — R627 Adult failure to thrive: Secondary | ICD-10-CM

## 2022-10-16 NOTE — Assessment & Plan Note (Signed)
R ureteral stone, sp R ureteroscopy stone free 03/2022, stent. SP  urethral stricture, on Oxybutynin. Hx of Ureteral stone with hydronephrosis per CT abd

## 2022-10-16 NOTE — Assessment & Plan Note (Signed)
BLE edema, not apparent, off Furosemide.

## 2022-10-16 NOTE — Assessment & Plan Note (Signed)
Heart rate is in control, takes Coreg 

## 2022-10-16 NOTE — Assessment & Plan Note (Signed)
Weight and mood are stable,  under Hospice service.

## 2022-10-16 NOTE — Assessment & Plan Note (Signed)
K 3.2 05/07/22, placed on Kcl 

## 2022-10-16 NOTE — Assessment & Plan Note (Addendum)
progressing, noted occurring in the afternoon now from at night only, taking MiraPex, will have Clonazepam 0.5mg  daily prn x 14 days, observe.

## 2022-10-16 NOTE — Assessment & Plan Note (Signed)
no active seizures, off meds.  

## 2022-10-16 NOTE — Assessment & Plan Note (Signed)
Hgb 11.4 05/07/22 

## 2022-10-16 NOTE — Progress Notes (Signed)
Location:   SNF [facility] Room Number: 43 Place of Service:  SNF (31) Provider: Arna Snipe Makhayla Mcmurry NP  Damoni Causby X, NP  Patient Care Team: Racine Erby X, NP as PCP - General (Internal Medicine) Ernesto Rutherford, MD as Consulting Physician (Ophthalmology) Jake Bathe, MD as Consulting Physician (Cardiology) Venancio Poisson, MD as Consulting Physician (Dermatology) Ranee Gosselin, MD as Consulting Physician (Orthopedic Surgery) Eugenia Mcalpine, MD as Consulting Physician (Orthopedic Surgery) Bradly Bienenstock, MD as Consulting Physician (Orthopedic Surgery) [name] Felisa Zechman X, NP as Nurse Practitioner (Nurse Practitioner) York Spaniel, MD (Inactive) as Consulting Physician (Neurology)  Extended Emergency Contact Information Primary Emergency Contact: [name],[name] Address: [address]          [id number]          [place], [place] [address] [place] of [place] Home Phone: [phone] Relation: Sister Secondary Emergency Contact: [place] Home Phone: [phone] Mobile Phone: [phone] Relation: Sister  Code Status:  DNR Goals of care: Advanced Directive information    08/08/2022    8:41 AM  Advanced Directives  Does Patient Have a Medical Advance Directive? Yes  Type of Advance Directive Out of facility DNR (pink MOST or yellow form)  Does patient want to make changes to medical advance directive? No - Patient declined  Pre-existing out of facility DNR order (yellow form or pink MOST form) Pink MOST form placed in chart (order not valid for inpatient use);Yellow form placed in chart (order not valid for inpatient use)     Chief Complaint  Patient presents with   Medical Management of Chronic Issues    HPI:  Pt is a 89 y.o. female seen today for medical management of chronic diseases.      02/12/22 Obstructive nephritis, underwent cystoscopy with right retrograde pyelogram, right ureteral stent placed, f/u Urology  Dr. Berneice Heinrich, 03/06/22 cystoscopy with retrograde pyelogram, ureteroscopy and stent replacement, SPC exchange.                05/03/22 abd [place] no renal or gallbladder stone or hydronephrosis. 05/02/22 Xray abd may represent ileus. Hx of Ureteral stone with hydronephrosis per CT abd                          05/13/22 Urology: R ureteral stone, sp R ureteroscopy stone free 03/2022, stent. SP  urethral stricture, on Oxybutynin. Hx of Ureteral stone with hydronephrosis per CT abd               Hypokalemia, K 3.2 05/07/22, placed on Kcl             Anemia, Hgb 11.4 05/07/22             SVT, takes Coreg             BLE edema, not apparent, off Furosemide.              Adult failure to thrive, under Hospice service.              Suprapubic Catheter, f/u Urology, off Uribel, on Oxybutynin.              Depression, stable, off Lorazepam             Dementia, under hospice service             Restless leg syndrome, progressing, noted occurring in the afternoon now from at night only, taking MiraPex.  Hx of seizures, no active seizures, off meds.              COPD, on ProAir, Albuterol neb.  Past Medical History:  Diagnosis Date   Acute bronchitis 05/23/2011   Acute upper respiratory infections of unspecified site 05/23/2011   Arthritis    Chronic airway obstruction, not elsewhere classified 05/23/2011   Coronary artery disease    Disturbance of salivary secretion 01/31/2011   Dizziness and giddiness 01/31/2011   Dyspnea    Essential tremor 04/25/2014   External hemorrhoids without mention of complication 01/31/2011   Gait disorder 04/25/2014   GERD (gastroesophageal reflux disease)    History of kidney stones    Insomnia, unspecified 09/12/2011   Lumbago 01/31/2011   Major depressive disorder, single episode, unspecified 01/31/2011   Memory disorder 04/25/2014   Mitral valve disorders(424.0) 01/31/2011   Other and unspecified hyperlipidemia 01/31/2011   Other convulsions 01/31/2011    Other emphysema (HCC) 01/31/2011   Pain in joint, site unspecified 01/31/2011   Restless legs syndrome (RLS) 09/12/2011   Retinal detachment with retinal defect of right eye 2011   right eye twice   Seizure disorder (HCC)    Senile osteoporosis 01/31/2011   Spontaneous ecchymoses 01/31/2011   Squamous cell carcinoma of leg    Stiffness of joints, not elsewhere classified, multiple sites 01/31/2011   Unspecified essential hypertension 01/31/2011   Past Surgical History:  Procedure Laterality Date   ABDOMINAL HYSTERECTOMY  06/21/2003   TAH/BSO, omenectomy PSB resect, Stg IC cystadenofibroma   CHOLECYSTECTOMY  2005   Dr. Carolynne Edouard   CYSTOSCOPY W/ URETERAL STENT PLACEMENT Right 02/13/2022   Procedure: CYSTOSCOPY WITH RETROGRADE PYELOGRAM/URETERAL STENT PLACEMENT, AND SUPRAPUBIC TUBE EXCHANGE;  Surgeon: Sebastian Ache, MD;  Location: [facility];  Service: Urology;  Laterality: Right;   CYSTOSCOPY WITH RETROGRADE PYELOGRAM, URETEROSCOPY AND STENT PLACEMENT Right 03/06/2022   Procedure: CYSTOSCOPY WITH RETROGRADE PYELOGRAM, URETEROSCOPY AND STENT REPLACEMENT, SUPRAPUBIC CATHETER EXCHANGE;  Surgeon: Sebastian Ache, MD;  Location: [facility];  Service: Urology;  Laterality: Right;   ELBOW SURGERY Right 2008   broken   Dr. Orlan Leavens   EYE SURGERY     HOLMIUM LASER APPLICATION Right 03/06/2022   Procedure: HOLMIUM LASER APPLICATION;  Surgeon: Sebastian Ache, MD;  Location: [facility];  Service: Urology;  Laterality: Right;   INTRAMEDULLARY (IM) NAIL INTERTROCHANTERIC Left 08/30/2021   Procedure: INTRAMEDULLARY (IM) NAIL INTERTROCHANTRIC;  Surgeon: Samson Frederic, MD;  Location: [facility];  Service: Orthopedics;  Laterality: Left;   ORIF PATELLA Left 05/02/2020   Procedure: OPEN REDUCTION INTERNAL (ORIF) FIXATION LEFT PATELLA WITH MEDIAL AND LATERAL LIGAMENT REINFORCEMENTS;  Surgeon: Sheral Apley, MD;  Location: [facility];  Service: Orthopedics;  Laterality: Left;   ORIF PATELLA Left 05/30/2020   Procedure: OPEN  REDUCTION INTERNAL (ORIF) FIXATION PATELLA;  Surgeon: Sheral Apley, MD;  Location: [facility];  Service: Orthopedics;  Laterality: Left;   RETINAL DETACHMENT SURGERY N/A    two   REVERSE SHOULDER ARTHROPLASTY Left 05/06/2019   Procedure: REVERSE SHOULDER ARTHROPLASTY;  Surgeon: Francena Hanly, MD;  Location: [facility];  Service: Orthopedics;  Laterality: Left;    ROTATOR CUFF REPAIR Right 2012   Dr. Thomasena Edis   SQUAMOUS CELL CARCINOMA EXCISION Bilateral 2012, 8/14   [place] on legs   Dr. Irene Limbo   TONSILLECTOMY  1941   VIDEO BRONCHOSCOPY WITH ENDOBRONCHIAL NAVIGATION N/A 11/29/2015   Procedure: VIDEO BRONCHOSCOPY WITH ENDOBRONCHIAL NAVIGATION;  Surgeon: Leslye Peer, MD;  Location: [facility] OR;  Service: Thoracic;  Laterality: N/A;    Allergies  Allergen Reactions   Vioxx [Rofecoxib] Shortness Of Breath   Dyazide [Hydrochlorothiazide W-Triamterene] Other (See Comments)    Lowers blood pressure too much   Latex Swelling and Other (See Comments)    "Allergic," per [facility]   Sulfa Antibiotics Nausea And Vomiting and Other (See Comments)    "Allergic," per [facility]   Sumycin [Tetracycline] Other (See Comments)    Can't take due to drug reaction- "Allergic," per [facility]    Allergies as of 10/16/2022       Reactions   Vioxx [rofecoxib] Shortness Of Breath   Dyazide [hydrochlorothiazide W-triamterene] Other (See Comments)   Lowers blood pressure too much   Latex Swelling, Other (See Comments)   "Allergic," per [facility]   Sulfa Antibiotics Nausea And Vomiting, Other (See Comments)   "Allergic," per [facility]   Sumycin [tetracycline] Other (See Comments)   Can't take due to drug reaction- "Allergic," per [facility]        Medication List        Accurate as of October 16, 2022  4:01 PM. If you have any questions, ask your nurse or doctor.          acetaminophen 325 MG tablet Commonly known as: TYLENOL Take 650 mg by mouth every 6 (six) hours as needed for fever or mild pain. Give 2 tablet by mouth every 6 hours  as needed for Pain   carvedilol 3.125 MG tablet Commonly known as: COREG Take 1 tablet (3.125 mg total) by mouth 2 (two) times daily with a meal.   fluticasone 27.5 MCG/SPRAY nasal spray Commonly known as: VERAMYST Place 1 spray into the nose daily.   ketoconazole 2 % shampoo Commonly known as: NIZORAL Apply 1 application  topically See admin instructions. Apply topically to scalp on Mondays and Thursdays for hair care   Lubriderm Daily Moisture Lotn Apply to left lower leg topically three times daily to rash, mix with triamcinolone 0/1% cream   Normal Saline Flush 0.9 % Soln Inject 30 mLs into the vein as directed. Monday, Wednesday, Thursday, Friday and Sunday   oxybutynin 5 MG tablet Commonly known as: DITROPAN Take 1 tablet (5 mg total) by mouth 3 (three) times daily.   potassium chloride 10 MEQ CR capsule Commonly known as: MICRO-K Take 10 mEq by mouth daily.   pramipexole 0.5 MG tablet Commonly known as: MIRAPEX Take 0.5 mg by mouth at bedtime.   ProAir RespiClick 108 (90 Base) MCG/ACT Aepb Generic drug: Albuterol Sulfate Inhale 2 puffs into the lungs daily as needed (shortness of breath or wheezing).   albuterol 1.25 MG/3ML nebulizer solution Commonly known as: ACCUNEB Take 1 ampule by nebulization every 6 (six) hours as needed for shortness of breath.   triamcinolone cream 0.1 % Commonly known as: KENALOG Apply 1 application. topically daily as needed (psoriasis).        Review of Systems  Constitutional:  Negative for fatigue, fever and unexpected weight change.  HENT:  Positive for hearing loss. Negative for congestion and trouble swallowing.   Eyes:  Negative for visual disturbance.       Left upper eyelid stye  Respiratory:  Negative for shortness of breath.        DOE is chronic  Cardiovascular:  Negative for leg swelling.  Gastrointestinal:  Negative for abdominal pain and constipation.       Acid reflux symptoms.   Genitourinary:  Positive for  difficulty urinating.       SPC, right  mid back pain lasted a few hours 2 days ago.   Musculoskeletal:  Positive for arthralgias and gait problem.       S/p left hip ORIF, ORIF of the left patella.   Skin:  Negative for color change.       BLE discoloration, chronic venous insufficiency skin changes. Left shin skin lesions.    Neurological:  Negative for seizures, weakness and headaches.       Memory lapses. Hx of seizures. RLS  Psychiatric/Behavioral:  Negative for behavioral problems and sleep disturbance. The patient is not nervous/anxious.     Immunization History  Administered Date(s) Administered   Influenza Split 01/06/2014, 01/15/2017, 01/08/2018, 12/09/2018   Influenza Whole 01/07/2012, 01/06/2013   Influenza, High Dose Seasonal PF 12/25/2015, 01/20/2017   Influenza,inj,Quad PF,6+ Mos 12/21/2014   Influenza-Unspecified 12/09/2018, 01/18/2020, 01/24/2021, 01/30/2022   Moderna SARS-COV2 Booster Vaccination 04/02/2021   Moderna Sars-Covid-2 Vaccination 04/12/2019, 06/05/2019, 02/15/2020, 09/05/2020   PFIZER(Purple Top)SARS-COV-2 Vaccination 12/26/2020   PPD Test 02/16/2022   Pneumococcal Conjugate-13 02/01/2014   Pneumococcal Polysaccharide-23 12/20/1992, 01/15/2000, 04/08/2004, 06/08/2004   Rsv, Bivalent, Protein Subunit Rsvpref,pf Verdis Frederickson) 04/26/2022   Td 04/08/2002, 04/22/2002   Tdap 04/09/2011, 08/17/2015   Zoster Recombinant(Shingrix) 05/29/2005, 08/27/2017, 06/13/2022   Zoster, Live 04/08/2008, 05/29/2014, 05/28/2017   Zoster, Unspecified 05/29/2005   Pertinent  Health Maintenance Due  Topic Date Due   INFLUENZA VACCINE  11/07/2022   DEXA SCAN  Completed   MAMMOGRAM  Discontinued      02/15/2022    3:00 PM 02/15/2022    9:00 PM 02/16/2022    8:00 AM 03/06/2022    7:25 AM 05/20/2022    3:42 PM  Fall Risk  Falls in the past year?     0  Was there an injury with Fall?     0  Fall Risk Category Calculator     0  (RETIRED) Patient Fall Risk Level Low fall risk  Moderate fall risk Moderate fall risk High fall risk   Patient at Risk for Falls Due to     No Fall Risks  Fall risk Follow up     Falls evaluation completed   Functional Status Survey:    Vitals:   10/16/22 1238  BP: 128/83  Pulse: 84  Resp: 19  Temp: 97.8 F (36.6 C)  SpO2: 95%  Weight: 148 lb 4.8 oz (67.3 kg)   Body mass index is 26.27 kg/m. Physical Exam Vitals and nursing note reviewed.  Constitutional:      Appearance: Normal appearance.  HENT:     Head: Normocephalic and atraumatic.     Nose: Nose normal.     Mouth/Throat:     Mouth: Mucous membranes are moist.  Eyes:     Extraocular Movements: Extraocular movements intact.     Conjunctiva/sclera: Conjunctivae normal.     Right eye: Right conjunctiva is not injected.     Left eye: Left conjunctiva is not injected.     Pupils: Pupils are equal, round, and reactive to light.  Neck:     Comments: Left adam's apple bony aspect, sometimes feels soreness on palpation is chronic, no noted lymph nodes  Cardiovascular:     Rate and Rhythm: Normal rate and regular rhythm.     Heart sounds: Murmur heard.     Comments: PD pulses are not felt from previous examination.  Pulmonary:     Effort: Pulmonary effort is normal.     Breath sounds: No rales.     Comments: Decreased air  entry to both lungs.  Abdominal:     General: Bowel sounds are normal.     Palpations: Abdomen is soft.     Tenderness: There is no abdominal tenderness.     Comments: Mid abd surgical scar. SPC  Genitourinary:    Comments: SPC Musculoskeletal:     Cervical back: Normal range of motion and neck supple.     Right lower leg: No edema.     Left lower leg: No edema.     Comments: Decreased overhead ROM of the left shoulder. Left knee s/p ORIF of the patella fx.   Skin:    General: Skin is warm and dry.     Findings: Lesion present.     Comments: BLE discoloration, chronic venous insufficiency skin changes. Left shin skin lesions-opted out workup  or tx.    Neurological:     General: No focal deficit present.     Mental Status: She is alert. Mental status is at baseline.     Gait: Gait abnormal.  Psychiatric:        Mood and Affect: Mood normal.        Behavior: Behavior normal.     Labs reviewed: Recent Labs    02/13/22 0543 02/14/22 0425 02/15/22 0427 02/16/22 0904 02/21/22 0000 05/07/22 0000  NA 141 142 141 139 142 141  K 3.8 3.1* 3.2* 3.4* 3.3* 3.2*  CL 112* 117* 115* 111 108 104  CO2 22 18* 21* 24 27* 29*  GLUCOSE 97 87 100* 100*  --   --   BUN 36* 34* 21 15 11 15   CREATININE 0.87 0.70 0.52 0.60 0.6 0.6  CALCIUM 8.3* 7.9* 8.2* 8.2* 8.1* 8.8  MG 2.3 2.1  --   --   --   --    Recent Labs    11/04/21 1047 12/11/21 0000 05/07/22 0000  AST 20 12* 12*  ALT 12 8 6*  ALKPHOS 157* 137* 100  BILITOT 0.5  --   --   PROT 7.0  --   --   ALBUMIN 3.6 3.0* 3.3*   Recent Labs    12/11/21 0000 02/12/22 1831 02/14/22 0425 02/15/22 0427 02/16/22 0904 02/21/22 0000 05/07/22 0000  WBC 4.0   < > 11.7* 9.3 9.7 6.3 5.0  NEUTROABS 2,352.00  --   --   --   --  4,082.00 2,815.00  HGB 11.7*   < > 9.9* 10.4* 10.9* 10.2* 11.4*  HCT 37   < > 32.6* 34.2* 37.0 32* 36  MCV  --    < > 85.1 84.7 86.9  --   --   PLT 179   < > 218 231 282 329 279   < > = values in this interval not displayed.   Lab Results  Component Value Date   TSH 1.198 02/15/2022   No results found for: "HGBA1C" Lab Results  Component Value Date   CHOL 147 09/27/2021   HDL 48 09/27/2021   LDLCALC 78 09/27/2021   TRIG 131 09/27/2021   CHOLHDL 3.6 06/27/2015    Significant Diagnostic Results in last 30 days:  No results found.  Assessment/Plan  Suprapubic catheter (HCC)  R ureteral stone, sp R ureteroscopy stone free 03/2022, stent. SP  urethral stricture, on Oxybutynin. Hx of Ureteral stone with hydronephrosis per CT abd  Hypokalemia  K 3.2 05/07/22, placed on Kcl  Anemia, chronic disease Hgb 11.4 05/07/22  SVT (supraventricular  tachycardia) Heart rate is in control,  takes Coreg  Edema  BLE edema, not apparent, off Furosemide.   Adult failure to thrive Weight and mood are stable,  under Hospice service.   Dementia without behavioral disturbance (HCC) Stable, under hospice service  Restless legs syndrome (RLS)  progressing, noted occurring in the afternoon now from at night only, taking MiraPex, will have Clonazepam 0.5mg  daily prn x 14 days, observe.   Seizure (HCC)  no active seizures, off meds.   COPD (chronic obstructive pulmonary disease) (HCC) on ProAir, Albuterol neb.    Family/ staff Communication: plan of care reviewed with the patient and charge nurse  Labs/tests ordered:  none  Time spend 35 minutes.

## 2022-10-16 NOTE — Assessment & Plan Note (Signed)
Stable,  under hospice service 

## 2022-10-16 NOTE — Assessment & Plan Note (Signed)
on ProAir, Albuterol neb.  

## 2022-11-15 ENCOUNTER — Non-Acute Institutional Stay (SKILLED_NURSING_FACILITY): Payer: Medicare PPO | Admitting: Nurse Practitioner

## 2022-11-15 ENCOUNTER — Encounter: Payer: Self-pay | Admitting: Nurse Practitioner

## 2022-11-15 DIAGNOSIS — G2581 Restless legs syndrome: Secondary | ICD-10-CM

## 2022-11-15 DIAGNOSIS — I471 Supraventricular tachycardia, unspecified: Secondary | ICD-10-CM

## 2022-11-15 DIAGNOSIS — E876 Hypokalemia: Secondary | ICD-10-CM | POA: Diagnosis not present

## 2022-11-15 DIAGNOSIS — Z9359 Other cystostomy status: Secondary | ICD-10-CM

## 2022-11-15 DIAGNOSIS — K649 Unspecified hemorrhoids: Secondary | ICD-10-CM | POA: Diagnosis not present

## 2022-11-15 NOTE — Assessment & Plan Note (Signed)
No injury, pain, or bleeding, noted 2x, avoid constipation, continue Colace for now.

## 2022-11-15 NOTE — Progress Notes (Unsigned)
Location:  [name] Room Number: 13 Place of Service:  SNF (31) Provider:  ,  X, NP  Patient Care Team: ,  X, NP as PCP - General (Internal Medicine) Ernesto Rutherford, MD as Consulting Physician (Ophthalmology) Jake Bathe, MD as Consulting Physician (Cardiology) Venancio Poisson, MD as Consulting Physician (Dermatology) Ranee Gosselin, MD as Consulting Physician (Orthopedic Surgery) Eugenia Mcalpine, MD (Inactive) as Consulting Physician (Orthopedic Surgery) Bradly Bienenstock, MD as Consulting Physician (Orthopedic Surgery) [name] ,  X, NP as Nurse Practitioner (Nurse Practitioner) York Spaniel, MD (Inactive) as Consulting Physician (Neurology)  Extended Emergency Contact Information Primary Emergency Contact: [name],[name] Address: [address]          [address]          [place], [place] [address] [place] of [place] Home Phone: [phone] Relation: Sister Secondary Emergency Contact: [place] Home Phone: [phone] Mobile Phone: [phone] Relation: Sister  Code Status:  DNR Goals of care: Advanced Directive information    11/15/2022   10:03 AM  Advanced Directives  Does Patient Have a Medical Advance Directive? Yes  Type of Advance Directive Out of facility DNR (pink MOST or yellow form)  Does patient want to make changes to medical advance directive? No - Patient declined  Pre-existing out of facility DNR order (yellow form or pink MOST form) Pink MOST form placed in chart (order not valid for inpatient use);Yellow form placed in chart (order not valid for inpatient use)     Chief Complaint  Patient presents with  . Medical agement of Chronic Issues    HPI:  Pt is a 89 y.o. female seen today for managing chronic medical conditions.    02/12/22 Obstructive nephritis, underwent cystoscopy with right retrograde pyelogram, right ureteral stent placed, f/u Urology  Dr. Berneice Heinrich, 03/06/22 cystoscopy with retrograde pyelogram, ureteroscopy and stent replacement, SPC exchange.                05/03/22 abd [place] no renal or gallbladder stone or hydronephrosis. 05/02/22 Xray abd may represent ileus. Hx of Ureteral stone with hydronephrosis per CT abd                          05/13/22 Urology: R ureteral stone, sp R ureteroscopy stone free 03/2022, stent. SP  urethral stricture, on Oxybutynin. Hx of Ureteral stone with hydronephrosis per CT abd   External hemorrhoids, placed on Colace             Hypokalemia, K 3.2 05/07/22, taking  Kcl             Anemia, Hgb 11.4 05/07/22             SVT, takes Coreg             BLE edema, not apparent, off Furosemide.              Adult failure to thrive, under Hospice service.              Suprapubic Catheter, f/u Urology, off Uribel, on Oxybutynin.              Depression, stable, off Lorazepam             Dementia, under hospice service             Restless leg syndrome, progressing, noted occurring in the afternoon now from at night only, taking MiraPex.  Hx of seizures, no active seizures, off meds.              COPD, on ProAir, Albuterol neb.    Past Medical History:  Diagnosis Date  . Acute bronchitis 05/23/2011  . Acute upper respiratory infections of unspecified site 05/23/2011  . Arthritis   . Chronic airway obstruction, not elsewhere classified 05/23/2011  . Coronary artery disease   . Disturbance of salivary secretion 01/31/2011  . Dizziness and giddiness 01/31/2011  . Dyspnea   . Essential tremor 04/25/2014  . External hemorrhoids without mention of complication 01/31/2011  . Gait disorder 04/25/2014  . GERD (gastroesophageal reflux disease)   . History of kidney stones   . Insomnia, unspecified 09/12/2011  . Lumbago 01/31/2011  . Major depressive disorder, single episode, unspecified 01/31/2011  . Memory disorder 04/25/2014  . Mitral valve disorders(424.0) 01/31/2011  . Other and unspecified  hyperlipidemia 01/31/2011  . Other convulsions 01/31/2011  . Other emphysema (HCC) 01/31/2011  . Pain in joint, site unspecified 01/31/2011  . Restless legs syndrome (RLS) 09/12/2011  . Retinal detachment with retinal defect of right eye 2011   right eye twice  . Seizure disorder (HCC)   . Senile osteoporosis 01/31/2011  . Spontaneous ecchymoses 01/31/2011  . Squamous cell carcinoma of leg   . Stiffness of joints, not elsewhere classified, multiple sites 01/31/2011  . Unspecified essential hypertension 01/31/2011   Past Surgical History:  Procedure Laterality Date  . ABDOMINAL HYSTERECTOMY  06/21/2003   TAH/BSO, omenectomy PSB resect, Stg IC cystadenofibroma  . CHOLECYSTECTOMY  2005   Dr. Carolynne Edouard  . CYSTOSCOPY W/ URETERAL STENT PLACEMENT Right 02/13/2022   Procedure: CYSTOSCOPY WITH RETROGRADE PYELOGRAM/URETERAL STENT PLACEMENT, AND SUPRAPUBIC TUBE EXCHANGE;  Surgeon: Sebastian Ache, MD;  Location: [facility];  Service: Urology;  Laterality: Right;  . CYSTOSCOPY WITH RETROGRADE PYELOGRAM, URETEROSCOPY AND STENT PLACEMENT Right 03/06/2022   Procedure: CYSTOSCOPY WITH RETROGRADE PYELOGRAM, URETEROSCOPY AND STENT REPLACEMENT, SUPRAPUBIC CATHETER EXCHANGE;  Surgeon: Sebastian Ache, MD;  Location: [facility];  Service: Urology;  Laterality: Right;  . ELBOW SURGERY Right 2008   broken   Dr. Orlan Leavens  . EYE SURGERY    . HOLMIUM LASER APPLICATION Right 03/06/2022   Procedure: HOLMIUM LASER APPLICATION;  Surgeon: Sebastian Ache, MD;  Location: [facility];  Service: Urology;  Laterality: Right;  . INTRAMEDULLARY (IM) NAIL INTERTROCHANTERIC Left 08/30/2021   Procedure: INTRAMEDULLARY (IM) NAIL INTERTROCHANTRIC;  Surgeon: Samson Frederic, MD;  Location: [facility];  Service: Orthopedics;  Laterality: Left;  . ORIF PATELLA Left 05/02/2020   Procedure: OPEN REDUCTION INTERNAL (ORIF) FIXATION LEFT PATELLA WITH MEDIAL AND LATERAL LIGAMENT REINFORCEMENTS;  Surgeon: Sheral Apley, MD;  Location: [facility];  Service:  Orthopedics;  Laterality: Left;  . ORIF PATELLA Left 05/30/2020   Procedure: OPEN REDUCTION INTERNAL (ORIF) FIXATION PATELLA;  Surgeon: Sheral Apley, MD;  Location: [facility];  Service: Orthopedics;  Laterality: Left;  . RETINAL DETACHMENT SURGERY N/A    two  . REVERSE SHOULDER ARTHROPLASTY Left 05/06/2019   Procedure: REVERSE SHOULDER ARTHROPLASTY;  Surgeon: Francena Hanly, MD;  Location: [facility];  Service: Orthopedics;  Laterality: Left;   . ROTATOR CUFF REPAIR Right 2012   Dr. Thomasena Edis  . SQUAMOUS CELL CARCINOMA EXCISION Bilateral 2012, 8/14   [place] on legs   Dr. Irene Limbo  . TONSILLECTOMY  1941  . VIDEO BRONCHOSCOPY WITH ENDOBRONCHIAL NAVIGATION N/A 11/29/2015   Procedure: VIDEO BRONCHOSCOPY WITH ENDOBRONCHIAL NAVIGATION;  Surgeon: Leslye Peer, MD;  Location: [facility];  Service:  Thoracic;  Laterality: N/A;    Allergies  Allergen Reactions  . Vioxx [Rofecoxib] Shortness Of Breath  . Dyazide [Hydrochlorothiazide W-Triamterene] Other (See Comments)    Lowers blood pressure too much  . Latex Swelling and Other (See Comments)    "Allergic," per [facility]  . Sulfa Antibiotics Nausea And Vomiting and Other (See Comments)    "Allergic," per MAR  . Sumycin [Tetracycline] Other (See Comments)    Can't take due to drug reaction- "Allergic," per [facility]    Outpatient Encounter Medications as of 11/15/2022  Medication Sig  . acetaminophen (TYLENOL) 325 MG tablet Take 650 mg by mouth every 6 (six) hours as needed for fever or mild pain. Give 2 tablet by mouth every 6 hours as needed for Pain  . acetic acid 0.25 % irrigation Irrigate with 1 Application as directed 2 (two) times daily.  [name] albuterol (ACCUNEB) 1.25 MG/3ML nebulizer solution Take 1 ampule by nebulization every 6 (six) hours as needed for shortness of breath.  . Albuterol Sulfate (PROAIR RESPICLICK) 108 (90 Base) MCG/ACT AEPB Inhale 2 puffs into the lungs daily as needed (shortness of breath or wheezing).  . Benzocaine-Menthol (ORAJEL 2X  TOOTHACHE & GUM) 20-0.26 % GEL Use as directed 1 Dose in the mouth or throat every 6 (six) hours as needed (Tooth Pain).  . carvedilol (COREG) 3.125 MG tablet Take 1 tablet (3.125 mg total) by mouth 2 (two) times daily with a meal.  . ketoconazole (NIZORAL) 2 % shampoo Apply 1 application  topically See admin instructions. Apply topically to scalp on Mondays and Thursdays for hair care  . Morphine Sulfate, Concentrate, 5 MG/0.25ML SOLN Take 0.25 mLs by mouth every 8 (eight) hours as needed.  [name] oxybutynin (DITROPAN) 5 MG tablet Take 1 tablet (5 mg total) by mouth 3 (three) times daily.  . potassium chloride (MICRO-K) 10 MEQ CR capsule Take 10 mEq by mouth daily.  . pramipexole (MIRAPEX) 0.5 MG tablet Take 0.5 mg by mouth at bedtime.  . [DISCONTINUED] Emollient (LUBRIDERM DAILY MOISTURE) LOTN Apply to left lower leg topically three times daily to rash, mix with triamcinolone 0/1% cream  . [DISCONTINUED] fluticasone (VERAMYST) 27.5 MCG/SPRAY nasal spray Place 1 spray into the nose daily.  . [DISCONTINUED] Sodium Chloride Flush (NORMAL SALINE FLUSH) 0.9 % SOLN Inject 30 mLs into the vein as directed. Monday, Wednesday, Thursday, Friday and Sunday  . [DISCONTINUED] triamcinolone (KENALOG) 0.1 % Apply 1 application. topically daily as needed (psoriasis).   No facility-administered encounter medications on file as of 11/15/2022.    Review of Systems  Constitutional:  Negative for fatigue, fever and unexpected weight change.  HENT:  Positive for hearing loss. Negative for congestion and trouble swallowing.   Eyes:  Negative for visual disturbance.  Respiratory:  Negative for shortness of breath.        DOE is chronic  Cardiovascular:  Negative for leg swelling.  Gastrointestinal:  Negative for abdominal pain, anal bleeding, constipation and rectal pain.       Hemorrhoids.   Genitourinary:  Positive for difficulty urinating.       SPC, right mid back pain lasted a few hours 2 days ago.    Musculoskeletal:  Positive for arthralgias and gait problem.       S/p left hip ORIF, ORIF of the left patella.   Skin:  Negative for color change.       BLE discoloration, chronic venous insufficiency skin changes. Left shin skin lesions.    Neurological:  Negative for  seizures, weakness and headaches.       Memory lapses. Hx of seizures. RLS  Psychiatric/Behavioral:  Negative for behavioral problems and sleep disturbance. The patient is not nervous/anxious.     Immunization History  Administered Date(s) Administered  . Influenza Split 01/06/2014, 01/15/2017, 01/08/2018, 12/09/2018  . Influenza Whole 01/07/2012, 01/06/2013  . Influenza, High Dose Seasonal PF 12/25/2015, 01/20/2017  . Influenza,inj,Quad PF,6+ Mos 12/21/2014  . Influenza-Unspecified 12/09/2018, 01/18/2020, 01/24/2021, 01/30/2022  . Moderna SARS-COV2 Booster Vaccination 04/02/2021  . Moderna Sars-Covid-2 Vaccination 04/12/2019, 06/05/2019, 02/15/2020, 09/05/2020  . PFIZER(Purple Top)SARS-COV-2 Vaccination 12/26/2020  . PPD Test 02/16/2022  . Pneumococcal Conjugate-13 02/01/2014  . Pneumococcal Polysaccharide-23 12/20/1992, 01/15/2000, 04/08/2004, 06/08/2004  . Rsv, Bivalent, Protein Subunit Rsvpref,pf Verdis Frederickson) 04/26/2022  . Td 04/08/2002, 04/22/2002  . Tdap 04/09/2011, 08/17/2015  . Zoster Recombinant(Shingrix) 05/29/2005, 08/27/2017, 06/13/2022  . Zoster, Live 04/08/2008, 05/29/2014, 05/28/2017  . Zoster, Unspecified 05/29/2005   Pertinent  Health Maintenance Due  Topic Date Due  . INFLUENZA VACCINE  11/07/2022  . DEXA SCAN  Completed  . MAMMOGRAM  Discontinued      02/15/2022    3:00 PM 02/15/2022    9:00 PM 02/16/2022    8:00 AM 03/06/2022    7:25 AM 05/20/2022    3:42 PM  Fall Risk  Falls in the past year?     0  Was there an injury with Fall?     0  Fall Risk Category Calculator     0  (RETIRED) Patient Fall Risk Level Low fall risk Moderate fall risk Moderate fall risk High fall risk   Patient at  Risk for Falls Due to     No Fall Risks  Fall risk Follow up     Falls evaluation completed   Functional Status Survey:    Vitals:   11/15/22 0934  BP: 139/77  Pulse: 77  Resp: 19  Temp: (!) 97.4 F (36.3 C)  SpO2: 93%  Weight: 153 lb 1.6 oz (69.4 kg)  Height: 5\' 3"  (1.6 m)   Body mass index is 27.12 kg/m. Physical Exam Vitals and nursing note reviewed.  Constitutional:      Appearance: Normal appearance.  HENT:     Head: Normocephalic and atraumatic.     Nose: Nose normal.     Mouth/Throat:     Mouth: Mucous membranes are moist.  Eyes:     Extraocular Movements: Extraocular movements intact.     Conjunctiva/sclera: Conjunctivae normal.     Right eye: Right conjunctiva is not injected.     Left eye: Left conjunctiva is not injected.     Pupils: Pupils are equal, round, and reactive to light.  Neck:     Comments: Left adam's apple bony aspect, sometimes feels soreness on palpation is chronic, no noted lymph nodes  Cardiovascular:     Rate and Rhythm: Normal rate and regular rhythm.     Heart sounds: Murmur heard.     Comments: PD pulses are not felt from previous examination.  Pulmonary:     Effort: Pulmonary effort is normal.     Breath sounds: No rales.     Comments: Decreased air entry to both lungs.  Abdominal:     General: Bowel sounds are normal.     Palpations: Abdomen is soft.     Tenderness: There is no abdominal tenderness.     Comments: Mid abd surgical scar. SPC  Genitourinary:    Comments: SPC. External hemorrhoids x2, no injury, bleeding, or pain  Musculoskeletal:  Cervical back: Normal range of motion and neck supple.     Right lower leg: No edema.     Left lower leg: No edema.     Comments: Decreased overhead ROM of the left shoulder. Left knee s/p ORIF of the patella fx.   Skin:    General: Skin is warm and dry.     Findings: Lesion present.     Comments: BLE discoloration, chronic venous insufficiency skin changes. Left shin skin  lesions-opted out workup or tx.    Neurological:     General: No focal deficit present.     Mental Status: She is alert. Mental status is at baseline.     Gait: Gait abnormal.  Psychiatric:        Mood and Affect: Mood normal.        Behavior: Behavior normal.    Labs reviewed: Recent Labs    02/13/22 0543 02/14/22 0425 02/15/22 0427 02/16/22 0904 02/21/22 0000 05/07/22 0000  NA 141 142 141 139 142 141  K 3.8 3.1* 3.2* 3.4* 3.3* 3.2*  CL 112* 117* 115* 111 108 104  CO2 22 18* 21* 24 27* 29*  GLUCOSE 97 87 100* 100*  --   --   BUN 36* 34* 21 15 11 15   CREATININE 0.87 0.70 0.52 0.60 0.6 0.6  CALCIUM 8.3* 7.9* 8.2* 8.2* 8.1* 8.8  MG 2.3 2.1  --   --   --   --    Recent Labs    12/11/21 0000 05/07/22 0000  AST 12* 12*  ALT 8 6*  ALKPHOS 137* 100  ALBUMIN 3.0* 3.3*   Recent Labs    12/11/21 0000 02/12/22 1831 02/14/22 0425 02/15/22 0427 02/16/22 0904 02/21/22 0000 05/07/22 0000  WBC 4.0   < > 11.7* 9.3 9.7 6.3 5.0  NEUTROABS 2,352.00  --   --   --   --  4,082.00 2,815.00  HGB 11.7*   < > 9.9* 10.4* 10.9* 10.2* 11.4*  HCT 37   < > 32.6* 34.2* 37.0 32* 36  MCV  --    < > 85.1 84.7 86.9  --   --   PLT 179   < > 218 231 282 329 279   < > = values in this interval not displayed.   Lab Results  Component Value Date   TSH 1.198 02/15/2022   No results found for: "HGBA1C" Lab Results  Component Value Date   CHOL 147 09/27/2021   HDL 48 09/27/2021   LDLCALC 78 09/27/2021   TRIG 131 09/27/2021   CHOLHDL 3.6 06/27/2015    Significant Diagnostic Results in last 30 days:  No results found.  Assessment/Plan Hemorrhoids No injury, pain, or bleeding, noted 2x, avoid constipation, continue Colace for now.   Hypokalemia  K 3.2 05/07/22, taking  Kcl  SVT (supraventricular tachycardia) Heart rate is in control, takes Coreg  Suprapubic catheter [facility]) f/u Urology, off Uribel, on Oxybutynin.   Restless legs syndrome (RLS)  progressing, noted occurring in the  afternoon now from at night only, taking MiraPex.   COPD (chronic obstructive pulmonary disease) (HCC) on ProAir, Albuterol neb.      Family/ staff Communication: plan of care reviewed with the patient and charge nurse.   Labs/tests ordered:  none  Time spend 35 minutes.

## 2022-11-15 NOTE — Assessment & Plan Note (Signed)
Heart rate is in control,  takes Coreg 

## 2022-11-15 NOTE — Assessment & Plan Note (Signed)
on ProAir, Albuterol neb.

## 2022-11-15 NOTE — Assessment & Plan Note (Signed)
progressing, noted occurring in the afternoon now from at night only, taking MiraPex.

## 2022-11-15 NOTE — Assessment & Plan Note (Signed)
K 3.2 05/07/22, taking  Kcl

## 2022-11-15 NOTE — Assessment & Plan Note (Signed)
f/u Urology, off Uribel, on Oxybutynin.  

## 2022-12-10 ENCOUNTER — Non-Acute Institutional Stay (SKILLED_NURSING_FACILITY): Admitting: Nurse Practitioner

## 2022-12-10 ENCOUNTER — Encounter: Payer: Self-pay | Admitting: Nurse Practitioner

## 2022-12-10 DIAGNOSIS — D638 Anemia in other chronic diseases classified elsewhere: Secondary | ICD-10-CM | POA: Diagnosis not present

## 2022-12-10 DIAGNOSIS — R627 Adult failure to thrive: Secondary | ICD-10-CM

## 2022-12-10 DIAGNOSIS — I471 Supraventricular tachycardia, unspecified: Secondary | ICD-10-CM

## 2022-12-10 DIAGNOSIS — E876 Hypokalemia: Secondary | ICD-10-CM

## 2022-12-10 DIAGNOSIS — R21 Rash and other nonspecific skin eruption: Secondary | ICD-10-CM

## 2022-12-10 DIAGNOSIS — Z9359 Other cystostomy status: Secondary | ICD-10-CM

## 2022-12-10 DIAGNOSIS — R569 Unspecified convulsions: Secondary | ICD-10-CM

## 2022-12-10 DIAGNOSIS — R609 Edema, unspecified: Secondary | ICD-10-CM

## 2022-12-10 DIAGNOSIS — G2581 Restless legs syndrome: Secondary | ICD-10-CM

## 2022-12-10 DIAGNOSIS — Z66 Do not resuscitate: Secondary | ICD-10-CM

## 2022-12-10 NOTE — Assessment & Plan Note (Signed)
 progressing, noted occurring in the afternoon now from at night only, taking MiraPex.

## 2022-12-10 NOTE — Assessment & Plan Note (Signed)
under Hospice service.  

## 2022-12-10 NOTE — Progress Notes (Signed)
Location:  [name] Room Number: [phone] [id number] Place of Service:  SNF (31) Provider:  Myldred Raju X, NP  Patient Care Team: Lilinoe Acklin X, NP as PCP - General (Internal Medicine) Ernesto Rutherford, MD as Consulting Physician (Ophthalmology) Jake Bathe, MD as Consulting Physician (Cardiology) Venancio Poisson, MD as Consulting Physician (Dermatology) Ranee Gosselin, MD as Consulting Physician (Orthopedic Surgery) Eugenia Mcalpine, MD (Inactive) as Consulting Physician (Orthopedic Surgery) Bradly Bienenstock, MD as Consulting Physician (Orthopedic Surgery) [name] Mcglaun X, NP as Nurse Practitioner (Nurse Practitioner) York Spaniel, MD (Inactive) as Consulting Physician (Neurology)  Extended Emergency Contact Information Primary Emergency Contact: [name],[name] Address: [address]          [address]          [place], [place] [address] [place] Home Phone: [phone] Relation: Sister Secondary Emergency Contact: [place] Home Phone: [phone] Mobile Phone: [phone] Relation: Sister  Code Status:  DNR Goals of care: Advanced Directive information    12/13/2022   11:05 AM  Advanced Directives  Does Patient Have a Medical Advance Directive? Yes  Type of Advance Directive Out of facility DNR (pink MOST or yellow form)  Does patient want to make changes to medical advance directive? No - Patient declined  Pre-existing out of facility DNR order (yellow form or pink MOST form) Yellow form placed in chart (order not valid for inpatient use);Pink MOST form placed in chart (order not valid for inpatient use)     Chief Complaint  Patient presents with  . Acute Visit    Itching     HPI:  Pt is a 89 y.o. female seen today for an acute visit for c/o itching allover, redness under breast, swollen lips 12/07/22, resolved today, prn Nystatin powder available to her. The patient was noted to have a  quarter sized rash lateral right chest under breat.    02/12/22 Obstructive nephritis, underwent cystoscopy with right retrograde pyelogram, right ureteral stent placed, f/u Urology Dr. Berneice Heinrich, 03/06/22 cystoscopy with retrograde pyelogram, ureteroscopy and stent replacement, SPC exchange.                05/03/22 abd [place] no renal or gallbladder stone or hydronephrosis. 05/02/22 Xray abd may represent ileus. Hx of Ureteral stone with hydronephrosis per CT abd                          05/13/22 Urology: R ureteral stone, sp R ureteroscopy stone free 03/2022, stent. SP  urethral stricture, on Oxybutynin. Hx of Ureteral stone with hydronephrosis per CT abd              External hemorrhoids, placed on Colace             Hypokalemia, K 3.2 05/07/22, taking  Kcl             Anemia, Hgb 11.4 05/07/22             SVT, takes Coreg             BLE edema, not apparent, off Furosemide.              Adult failure to thrive, under Hospice service.              Suprapubic Catheter, f/u Urology, off Uribel, on Oxybutynin.              Depression, stable, off Lorazepam  Dementia, under hospice service             Restless leg syndrome, progressing, noted occurring in the afternoon now from at night only, taking MiraPex.              Hx of seizures, no active seizures, off meds.              COPD, on ProAir, Albuterol neb.      Past Medical History:  Diagnosis Date  . Acute bronchitis 05/23/2011  . Acute upper respiratory infections of unspecified site 05/23/2011  . Arthritis   . Chronic airway obstruction, not elsewhere classified 05/23/2011  . Coronary artery disease   . Disturbance of salivary secretion 01/31/2011  . Dizziness and giddiness 01/31/2011  . Dyspnea   . Essential tremor 04/25/2014  . External hemorrhoids without mention of complication 01/31/2011  . Gait disorder 04/25/2014  . GERD (gastroesophageal reflux disease)   . History of kidney stones   . Insomnia, unspecified 09/12/2011   . Lumbago 01/31/2011  . Major depressive disorder, single episode, unspecified 01/31/2011  . Memory disorder 04/25/2014  . Mitral valve disorders(424.0) 01/31/2011  . Other and unspecified hyperlipidemia 01/31/2011  . Other convulsions 01/31/2011  . Other emphysema (HCC) 01/31/2011  . Pain in joint, site unspecified 01/31/2011  . Restless legs syndrome (RLS) 09/12/2011  . Retinal detachment with retinal defect of right eye 2011   right eye twice  . Seizure disorder (HCC)   . Senile osteoporosis 01/31/2011  . Spontaneous ecchymoses 01/31/2011  . Squamous cell carcinoma of leg   . Stiffness of joints, not elsewhere classified, multiple sites 01/31/2011  . Unspecified essential hypertension 01/31/2011   Past Surgical History:  Procedure Laterality Date  . ABDOMINAL HYSTERECTOMY  06/21/2003   TAH/BSO, omenectomy PSB resect, Stg IC cystadenofibroma  . CHOLECYSTECTOMY  2005   Dr. Carolynne Edouard  . CYSTOSCOPY W/ URETERAL STENT PLACEMENT Right 02/13/2022   Procedure: CYSTOSCOPY WITH RETROGRADE PYELOGRAM/URETERAL STENT PLACEMENT, AND SUPRAPUBIC TUBE EXCHANGE;  Surgeon: Sebastian Ache, MD;  Location: [facility];  Service: Urology;  Laterality: Right;  . CYSTOSCOPY WITH RETROGRADE PYELOGRAM, URETEROSCOPY AND STENT PLACEMENT Right 03/06/2022   Procedure: CYSTOSCOPY WITH RETROGRADE PYELOGRAM, URETEROSCOPY AND STENT REPLACEMENT, SUPRAPUBIC CATHETER EXCHANGE;  Surgeon: Sebastian Ache, MD;  Location: [facility];  Service: Urology;  Laterality: Right;  . ELBOW SURGERY Right 2008   broken   Dr. Orlan Leavens  . EYE SURGERY    . HOLMIUM LASER APPLICATION Right 03/06/2022   Procedure: HOLMIUM LASER APPLICATION;  Surgeon: Sebastian Ache, MD;  Location: [facility];  Service: Urology;  Laterality: Right;  . INTRAMEDULLARY (IM) NAIL INTERTROCHANTERIC Left 08/30/2021   Procedure: INTRAMEDULLARY (IM) NAIL INTERTROCHANTRIC;  Surgeon: Samson Frederic, MD;  Location: [facility];  Service: Orthopedics;  Laterality: Left;  . ORIF PATELLA Left  05/02/2020   Procedure: OPEN REDUCTION INTERNAL (ORIF) FIXATION LEFT PATELLA WITH MEDIAL AND LATERAL LIGAMENT REINFORCEMENTS;  Surgeon: Sheral Apley, MD;  Location: [facility];  Service: Orthopedics;  Laterality: Left;  . ORIF PATELLA Left 05/30/2020   Procedure: OPEN REDUCTION INTERNAL (ORIF) FIXATION PATELLA;  Surgeon: Sheral Apley, MD;  Location: [facility];  Service: Orthopedics;  Laterality: Left;  . RETINAL DETACHMENT SURGERY N/A    two  . REVERSE SHOULDER ARTHROPLASTY Left 05/06/2019   Procedure: REVERSE SHOULDER ARTHROPLASTY;  Surgeon: Francena Hanly, MD;  Location: [facility];  Service: Orthopedics;  Laterality: Left;   . ROTATOR CUFF REPAIR Right 2012   Dr. Thomasena Edis  . SQUAMOUS CELL CARCINOMA  EXCISION Bilateral 2012, 8/14   [place] on legs   Dr. Irene Limbo  . TONSILLECTOMY  1941  . VIDEO BRONCHOSCOPY WITH ENDOBRONCHIAL NAVIGATION N/A 11/29/2015   Procedure: VIDEO BRONCHOSCOPY WITH ENDOBRONCHIAL NAVIGATION;  Surgeon: Leslye Peer, MD;  Location: [facility];  Service: Thoracic;  Laterality: N/A;    Allergies  Allergen Reactions  . Vioxx [Rofecoxib] Shortness Of Breath  . Dyazide [Hydrochlorothiazide W-Triamterene] Other (See Comments)    Lowers blood pressure too much  . Latex Swelling and Other (See Comments)    "Allergic," per [facility]  . Sulfa Antibiotics Nausea And Vomiting and Other (See Comments)    "Allergic," per [facility]  . Sumycin [Tetracycline] Other (See Comments)    Can't take due to drug reaction- "Allergic," per [facility]    Outpatient Encounter Medications as of 12/10/2022  Medication Sig  . acetaminophen (TYLENOL) 325 MG tablet Take 650 mg by mouth every 6 (six) hours as needed for fever or mild pain. Give 2 tablet by mouth every 6 hours as needed for Pain  . acetic acid 0.25 % irrigation Irrigate with 1 Application as directed 2 (two) times daily.  [name] albuterol (ACCUNEB) 1.25 MG/3ML nebulizer solution Take 1 ampule by nebulization every 6 (six) hours as needed for shortness of  breath.  . Albuterol Sulfate (PROAIR RESPICLICK) 108 (90 Base) MCG/ACT AEPB Inhale 2 puffs into the lungs daily as needed (shortness of breath or wheezing).  . Benzocaine-Menthol (ORAJEL 2X TOOTHACHE & GUM) 20-0.26 % GEL Use as directed 1 Dose in the mouth or throat every 6 (six) hours as needed (Tooth Pain).  . carvedilol (COREG) 3.125 MG tablet Take 1 tablet (3.125 mg total) by mouth 2 (two) times daily with a meal.  . docusate sodium (COLACE) 100 MG capsule Take 100 mg by mouth as needed for mild constipation.  . Morphine Sulfate, Concentrate, 5 MG/0.25ML SOLN Take 0.25 mLs by mouth every 8 (eight) hours as needed.  [name] oxybutynin (DITROPAN) 5 MG tablet Take 5 mg by mouth 2 (two) times daily.  . potassium chloride (MICRO-K) 10 MEQ CR capsule Take 10 mEq by mouth daily.  . pramipexole (MIRAPEX) 0.5 MG tablet Take 0.5 mg by mouth at bedtime.  . [DISCONTINUED] ketoconazole (NIZORAL) 2 % shampoo Apply 1 application  topically See admin instructions. Apply topically to scalp on Mondays and Thursdays for hair care  . [DISCONTINUED] oxybutynin (DITROPAN) 5 MG tablet Take 1 tablet (5 mg total) by mouth 3 (three) times daily.   No facility-administered encounter medications on file as of 12/10/2022.    Review of Systems  Constitutional:  Negative for fatigue, fever and unexpected weight change.  HENT:  Positive for hearing loss. Negative for congestion and trouble swallowing.   Eyes:  Negative for visual disturbance.  Respiratory:  Negative for shortness of breath.        DOE is chronic  Cardiovascular:  Negative for leg swelling.  Gastrointestinal:  Negative for abdominal pain, anal bleeding, constipation and rectal pain.       Hemorrhoids.   Genitourinary:  Positive for difficulty urinating.       SPC, right mid back pain lasted a few hours 2 days ago.   Musculoskeletal:  Positive for arthralgias and gait problem.       S/p left hip ORIF, ORIF of the left patella.   Skin:  Negative for color  change.       BLE discoloration, chronic venous insufficiency skin changes. Left shin skin lesions.    Neurological:  Negative for seizures, weakness and headaches.       Memory lapses. Hx of seizures. RLS  Psychiatric/Behavioral:  Negative for behavioral problems and sleep disturbance. The patient is not nervous/anxious.     Immunization History  Administered Date(s) Administered  . Influenza Split 01/06/2014, 01/15/2017, 01/08/2018, 12/09/2018  . Influenza Whole 01/07/2012, 01/06/2013  . Influenza, High Dose Seasonal PF 12/25/2015, 01/20/2017  . Influenza,inj,Quad PF,6+ Mos 12/21/2014  . Influenza-Unspecified 12/09/2018, 01/18/2020, 01/24/2021, 01/30/2022  . Moderna SARS-COV2 Booster Vaccination 04/02/2021  . Moderna Sars-Covid-2 Vaccination 04/12/2019, 06/05/2019, 02/15/2020, 09/05/2020  . PFIZER(Purple Top)SARS-COV-2 Vaccination 12/26/2020  . PPD Test 02/16/2022  . Pneumococcal Conjugate-13 02/01/2014  . Pneumococcal Polysaccharide-23 12/20/1992, 01/15/2000, 04/08/2004, 06/08/2004  . Rsv, Bivalent, Protein Subunit Rsvpref,pf Verdis Frederickson) 04/26/2022  . Td 04/08/2002, 04/22/2002  . Tdap 04/09/2011, 08/17/2015  . Zoster Recombinant(Shingrix) 05/29/2005, 08/27/2017, 06/13/2022  . Zoster, Live 04/08/2008, 05/29/2014, 05/28/2017  . Zoster, Unspecified 05/29/2005   Pertinent  Health Maintenance Due  Topic Date Due  . INFLUENZA VACCINE  11/07/2022  . DEXA SCAN  Completed  . MAMMOGRAM  Discontinued      02/15/2022    3:00 PM 02/15/2022    9:00 PM 02/16/2022    8:00 AM 03/06/2022    7:25 AM 05/20/2022    3:42 PM  Fall Risk  Falls in the past year?     0  Was there an injury with Fall?     0  Fall Risk Category Calculator     0  (RETIRED) Patient Fall Risk Level Low fall risk Moderate fall risk Moderate fall risk High fall risk   Patient at Risk for Falls Due to     No Fall Risks  Fall risk Follow up     Falls evaluation completed   Functional Status Survey:    Vitals:    12/10/22 1148  BP: 126/79  Pulse: 86  Temp: (!) 97.2 F (36.2 C)  SpO2: 92%  Weight: 156 lb (70.8 kg)  Height: 5\' 3"  (1.6 m)   Body mass index is 27.63 kg/m. Physical Exam Vitals and nursing note reviewed.  Constitutional:      Appearance: Normal appearance.  HENT:     Head: Normocephalic and atraumatic.     Nose: Nose normal.     Mouth/Throat:     Mouth: Mucous membranes are moist.  Eyes:     Extraocular Movements: Extraocular movements intact.     Conjunctiva/sclera: Conjunctivae normal.     Right eye: Right conjunctiva is not injected.     Left eye: Left conjunctiva is not injected.     Pupils: Pupils are equal, round, and reactive to light.  Neck:     Comments: Left adam's apple bony aspect, sometimes feels soreness on palpation is chronic, no noted lymph nodes  Cardiovascular:     Rate and Rhythm: Normal rate and regular rhythm.     Heart sounds: Murmur heard.     Comments: PD pulses are not felt from previous examination.  Pulmonary:     Effort: Pulmonary effort is normal.     Breath sounds: No rales.     Comments: Decreased air entry to both lungs.  Abdominal:     General: Bowel sounds are normal.     Palpations: Abdomen is soft.     Tenderness: There is no abdominal tenderness.     Comments: Mid abd surgical scar. SPC  Genitourinary:    Comments: SPC. External hemorrhoids x2, no injury, bleeding, or pain  Musculoskeletal:  Cervical back: Normal range of motion and neck supple.     Right lower leg: No edema.     Left lower leg: No edema.     Comments: Decreased overhead ROM of the left shoulder. Left knee s/p ORIF of the patella fx.   Skin:    General: Skin is warm and dry.     Findings: Lesion present.     Comments: BLE discoloration, chronic venous insufficiency skin changes. Left shin skin lesions-opted out workup or tx.    Neurological:     General: No focal deficit present.     Mental Status: She is alert. Mental status is at baseline.     Gait:  Gait abnormal.  Psychiatric:        Mood and Affect: Mood normal.        Behavior: Behavior normal.    Labs reviewed: Recent Labs    02/13/22 0543 02/14/22 0425 02/15/22 0427 02/16/22 0904 02/21/22 0000 05/07/22 0000  NA 141 142 141 139 142 141  K 3.8 3.1* 3.2* 3.4* 3.3* 3.2*  CL 112* 117* 115* 111 108 104  CO2 22 18* 21* 24 27* 29*  GLUCOSE 97 87 100* 100*  --   --   BUN 36* 34* 21 15 11 15   CREATININE 0.87 0.70 0.52 0.60 0.6 0.6  CALCIUM 8.3* 7.9* 8.2* 8.2* 8.1* 8.8  MG 2.3 2.1  --   --   --   --    Recent Labs    05/07/22 0000  AST 12*  ALT 6*  ALKPHOS 100  ALBUMIN 3.3*   Recent Labs    02/14/22 0425 02/15/22 0427 02/16/22 0904 02/21/22 0000 05/07/22 0000  WBC 11.7* 9.3 9.7 6.3 5.0  NEUTROABS  --   --   --  4,082.00 2,815.00  HGB 9.9* 10.4* 10.9* 10.2* 11.4*  HCT 32.6* 34.2* 37.0 32* 36  MCV 85.1 84.7 86.9  --   --   PLT 218 231 282 329 279   Lab Results  Component Value Date   TSH 1.198 02/15/2022   No results found for: "HGBA1C" Lab Results  Component Value Date   CHOL 147 09/27/2021   HDL 48 09/27/2021   LDLCALC 78 09/27/2021   TRIG 131 09/27/2021   CHOLHDL 3.6 06/27/2015    Significant Diagnostic Results in last 30 days:  No results found.  Assessment/Plan: Rash c/o itching allover, redness under breast, swollen lips 12/07/22, resolved today, prn Nystatin powder available to her. The patient was noted to have a quarter sized rash lateral right chest under breat. Keep the area dry, clean, apply Nystatin.   Hypokalemia  K 3.2 05/07/22, taking  Kcl  Anemia, chronic disease Hgb 11.4 05/07/22  SVT (supraventricular tachycardia) Heart rate is in control, takes Coreg  Edema  not apparent, off Furosemide.   Adult failure to thrive under Hospice service.   Suprapubic catheter [facility]) f/u Urology, off Uribel, on Oxybutynin.   Restless legs syndrome (RLS)  progressing, noted occurring in the afternoon now from at night only, taking  MiraPex.   Seizure (HCC) no active seizures, off meds.   COPD (chronic obstructive pulmonary disease) (HCC) Stable,  on ProAir, Albuterol neb.      Family/ staff Communication: plan of care reviewed with the patient and charge nurse.   Labs/tests ordered:  none  Time spend 35 minutes.

## 2022-12-10 NOTE — Assessment & Plan Note (Signed)
c/o itching allover, redness under breast, swollen lips 12/07/22, resolved today, prn Nystatin powder available to her. The patient was noted to have a quarter sized rash lateral right chest under breat. Keep the area dry, clean, apply Nystatin.

## 2022-12-10 NOTE — Assessment & Plan Note (Signed)
no active seizures, off meds.  

## 2022-12-10 NOTE — Assessment & Plan Note (Signed)
Stable, on ProAir, Albuterol neb.

## 2022-12-10 NOTE — Assessment & Plan Note (Signed)
Heart rate is in control,  takes Coreg 

## 2022-12-10 NOTE — Assessment & Plan Note (Signed)
 K 3.2 05/07/22, taking  Kcl

## 2022-12-10 NOTE — Assessment & Plan Note (Signed)
not apparent, off Furosemide.  

## 2022-12-10 NOTE — Assessment & Plan Note (Signed)
Hgb 11.4 05/07/22

## 2022-12-10 NOTE — Assessment & Plan Note (Signed)
f/u Urology, off Uribel, on Oxybutynin.  

## 2022-12-13 ENCOUNTER — Encounter: Payer: Self-pay | Admitting: Sports Medicine

## 2022-12-13 ENCOUNTER — Non-Acute Institutional Stay (SKILLED_NURSING_FACILITY): Payer: Self-pay | Admitting: Sports Medicine

## 2022-12-13 DIAGNOSIS — R21 Rash and other nonspecific skin eruption: Secondary | ICD-10-CM | POA: Diagnosis not present

## 2022-12-13 DIAGNOSIS — R609 Edema, unspecified: Secondary | ICD-10-CM

## 2022-12-13 DIAGNOSIS — G2581 Restless legs syndrome: Secondary | ICD-10-CM | POA: Diagnosis not present

## 2022-12-13 DIAGNOSIS — F331 Major depressive disorder, recurrent, moderate: Secondary | ICD-10-CM | POA: Diagnosis not present

## 2022-12-13 DIAGNOSIS — I471 Supraventricular tachycardia, unspecified: Secondary | ICD-10-CM | POA: Diagnosis not present

## 2022-12-13 NOTE — Progress Notes (Unsigned)
Location:  [name] Room Number: 29A Place of Service:  SNF (31) Provider:  Tally Joe  Mast, Man X, NP  Patient Care Team: Mast, Man X, NP as PCP - General (Internal Medicine) Ernesto Rutherford, MD as Consulting Physician (Ophthalmology) Jake Bathe, MD as Consulting Physician (Cardiology) Venancio Poisson, MD as Consulting Physician (Dermatology) Ranee Gosselin, MD as Consulting Physician (Orthopedic Surgery) Eugenia Mcalpine, MD (Inactive) as Consulting Physician (Orthopedic Surgery) Bradly Bienenstock, MD as Consulting Physician (Orthopedic Surgery) [name] Mast, Man X, NP as Nurse Practitioner (Nurse Practitioner) York Spaniel, MD (Inactive) as Consulting Physician (Neurology)  Extended Emergency Contact Information Primary Emergency Contact: [name],[name] Address: [address]          [id number]          [place], [place] [address] [place] Home Phone: [phone] Relation: Sister Secondary Emergency Contact: Rocky [place] Home Phone: [phone] Mobile Phone: [phone] Relation: Sister  Code Status:  DNR  Goals of care: Advanced Directive information    12/13/2022   11:05 AM  Advanced Directives  Does Patient Have a Medical Advance Directive? Yes  Type of Advance Directive Out of facility DNR (pink MOST or yellow form)  Does patient want to make changes to medical advance directive? No - Patient declined  Pre-existing out of facility DNR order (yellow form or pink MOST form) Yellow form placed in chart (order not valid for inpatient use);Pink MOST form placed in chart (order not valid for inpatient use)     Chief Complaint  Patient presents with   Medical Management of Chronic Issues    Patient is being seen for a routine visit.    Immunizations    Patient is due for a flu vaccine     HPI:  Pt is a 89 y.o. female seen today for medical management of chronic  diseases.   Spoke with hospice nurse, who reports that pt seems to be depressed She consumes 50- 75% of her meals  Does not drink enough water    Looking forward to eat dinner with her sisters  Lost appetite  Denies dysphagia,  She ate 1/ hamburger No problems with sleep  States when she  tries to stand up her leg hurts     Rash   HTN   Urinary incontinence  Restless leg syndrome   Depression   Past Medical History:  Diagnosis Date   Acute bronchitis 05/23/2011   Acute upper respiratory infections of unspecified site 05/23/2011   Arthritis    Chronic airway obstruction, not elsewhere classified 05/23/2011   Coronary artery disease    Disturbance of salivary secretion 01/31/2011   Dizziness and giddiness 01/31/2011   Dyspnea    Essential tremor 04/25/2014   External hemorrhoids without mention of complication 01/31/2011   Gait disorder 04/25/2014   GERD (gastroesophageal reflux disease)    History of kidney stones    Insomnia, unspecified 09/12/2011   Lumbago 01/31/2011   Major depressive disorder, single episode, unspecified 01/31/2011   Memory disorder 04/25/2014   Mitral valve disorders(424.0) 01/31/2011   Other and unspecified hyperlipidemia 01/31/2011   Other convulsions 01/31/2011   Other emphysema (HCC) 01/31/2011   Pain in joint, site unspecified 01/31/2011   Restless legs syndrome (RLS) 09/12/2011   Retinal detachment with retinal defect of right eye 2011   right eye twice   Seizure disorder (HCC)    Senile osteoporosis 01/31/2011   Spontaneous ecchymoses 01/31/2011  Squamous cell carcinoma of leg    Stiffness of joints, not elsewhere classified, multiple sites 01/31/2011   Unspecified essential hypertension 01/31/2011   Past Surgical History:  Procedure Laterality Date   ABDOMINAL HYSTERECTOMY  06/21/2003   TAH/BSO, omenectomy PSB resect, Stg IC cystadenofibroma   CHOLECYSTECTOMY  2005   Dr. Carolynne Edouard   CYSTOSCOPY W/ URETERAL STENT PLACEMENT Right  02/13/2022   Procedure: CYSTOSCOPY WITH RETROGRADE PYELOGRAM/URETERAL STENT PLACEMENT, AND SUPRAPUBIC TUBE EXCHANGE;  Surgeon: Sebastian Ache, MD;  Location: [facility];  Service: Urology;  Laterality: Right;   CYSTOSCOPY WITH RETROGRADE PYELOGRAM, URETEROSCOPY AND STENT PLACEMENT Right 03/06/2022   Procedure: CYSTOSCOPY WITH RETROGRADE PYELOGRAM, URETEROSCOPY AND STENT REPLACEMENT, SUPRAPUBIC CATHETER EXCHANGE;  Surgeon: Sebastian Ache, MD;  Location: [facility];  Service: Urology;  Laterality: Right;   ELBOW SURGERY Right 2008   broken   Dr. Orlan Leavens   EYE SURGERY     HOLMIUM LASER APPLICATION Right 03/06/2022   Procedure: HOLMIUM LASER APPLICATION;  Surgeon: Sebastian Ache, MD;  Location: [facility];  Service: Urology;  Laterality: Right;   INTRAMEDULLARY (IM) NAIL INTERTROCHANTERIC Left 08/30/2021   Procedure: INTRAMEDULLARY (IM) NAIL INTERTROCHANTRIC;  Surgeon: Samson Frederic, MD;  Location: [facility];  Service: Orthopedics;  Laterality: Left;   ORIF PATELLA Left 05/02/2020   Procedure: OPEN REDUCTION INTERNAL (ORIF) FIXATION LEFT PATELLA WITH MEDIAL AND LATERAL LIGAMENT REINFORCEMENTS;  Surgeon: Sheral Apley, MD;  Location: [facility];  Service: Orthopedics;  Laterality: Left;   ORIF PATELLA Left 05/30/2020   Procedure: OPEN REDUCTION INTERNAL (ORIF) FIXATION PATELLA;  Surgeon: Sheral Apley, MD;  Location: [facility];  Service: Orthopedics;  Laterality: Left;   RETINAL DETACHMENT SURGERY N/A    two   REVERSE SHOULDER ARTHROPLASTY Left 05/06/2019   Procedure: REVERSE SHOULDER ARTHROPLASTY;  Surgeon: Francena Hanly, MD;  Location: [facility];  Service: Orthopedics;  Laterality: Left;    ROTATOR CUFF REPAIR Right 2012   Dr. Thomasena Edis   SQUAMOUS CELL CARCINOMA EXCISION Bilateral 2012, 8/14   [place] on legs   Dr. Irene Limbo   TONSILLECTOMY  1941   VIDEO BRONCHOSCOPY WITH ENDOBRONCHIAL NAVIGATION N/A 11/29/2015   Procedure: VIDEO BRONCHOSCOPY WITH ENDOBRONCHIAL NAVIGATION;  Surgeon: Leslye Peer, MD;   Location: [facility];  Service: Thoracic;  Laterality: N/A;    Allergies  Allergen Reactions   Vioxx [Rofecoxib] Shortness Of Breath   Dyazide [Hydrochlorothiazide W-Triamterene] Other (See Comments)    Lowers blood pressure too much   Latex Swelling and Other (See Comments)    "Allergic," per [facility]   Sulfa Antibiotics Nausea And Vomiting and Other (See Comments)    "Allergic," per [facility]   Sumycin [Tetracycline] Other (See Comments)    Can't take due to drug reaction- "Allergic," per [facility]    Outpatient Encounter Medications as of 12/13/2022  Medication Sig   acetaminophen (TYLENOL) 325 MG tablet Take 650 mg by mouth every 6 (six) hours as needed for fever or mild pain. Give 2 tablet by mouth every 6 hours as needed for Pain   acetic acid 0.25 % irrigation Irrigate with 1 Application as directed 2 (two) times daily.   albuterol (ACCUNEB) 1.25 MG/3ML nebulizer solution Take 1 ampule by nebulization every 6 (six) hours as needed for shortness of breath.   Albuterol Sulfate (PROAIR RESPICLICK) 108 (90 Base) MCG/ACT AEPB Inhale 2 puffs into the lungs daily as needed (shortness of breath or wheezing).   Benzocaine-Menthol (ORAJEL 2X TOOTHACHE & GUM) 20-0.26 % GEL Use as directed 1 Dose in the mouth  or throat every 6 (six) hours as needed (Tooth Pain).   carvedilol (COREG) 3.125 MG tablet Take 1 tablet (3.125 mg total) by mouth 2 (two) times daily with a meal.   docusate sodium (COLACE) 100 MG capsule Take 100 mg by mouth as needed for mild constipation.   Morphine Sulfate, Concentrate, 5 MG/0.25ML SOLN Take 0.25 mLs by mouth every 8 (eight) hours as needed.   NYAMYC powder Apply 1 Application topically daily as needed.   oxybutynin (DITROPAN) 5 MG tablet Take 5 mg by mouth 2 (two) times daily.   potassium chloride (MICRO-K) 10 MEQ CR capsule Take 10 mEq by mouth daily.   pramipexole (MIRAPEX) 0.5 MG tablet Take 0.5 mg by mouth at bedtime.   No facility-administered encounter medications on file as of  12/13/2022.    Review of Systems  Immunization History  Administered Date(s) Administered   Influenza Split 01/06/2014, 01/15/2017, 01/08/2018, 12/09/2018   Influenza Whole 01/07/2012, 01/06/2013   Influenza, High Dose Seasonal PF 12/25/2015, 01/20/2017   Influenza,inj,Quad PF,6+ Mos 12/21/2014   Influenza-Unspecified 12/09/2018, 01/18/2020, 01/24/2021, 01/30/2022   Moderna SARS-COV2 Booster Vaccination 04/02/2021   Moderna Sars-Covid-2 Vaccination 04/12/2019, 06/05/2019, 02/15/2020, 09/05/2020   PFIZER(Purple Top)SARS-COV-2 Vaccination 12/26/2020   PPD Test 02/16/2022   Pneumococcal Conjugate-13 02/01/2014   Pneumococcal Polysaccharide-23 12/20/1992, 01/15/2000, 04/08/2004, 06/08/2004   Rsv, Bivalent, Protein Subunit Rsvpref,pf Verdis Frederickson) 04/26/2022   Td 04/08/2002, 04/22/2002   Tdap 04/09/2011, 08/17/2015   Zoster Recombinant(Shingrix) 05/29/2005, 08/27/2017, 06/13/2022   Zoster, Live 04/08/2008, 05/29/2014, 05/28/2017   Zoster, Unspecified 05/29/2005   Pertinent  Health Maintenance Due  Topic Date Due   INFLUENZA VACCINE  11/07/2022   DEXA SCAN  Completed   MAMMOGRAM  Discontinued      02/15/2022    3:00 PM 02/15/2022    9:00 PM 02/16/2022    8:00 AM 03/06/2022    7:25 AM 05/20/2022    3:42 PM  Fall Risk  Falls in the past year?     0  Was there an injury with Fall?     0  Fall Risk Category Calculator     0  (RETIRED) Patient Fall Risk Level Low fall risk Moderate fall risk Moderate fall risk High fall risk   Patient at Risk for Falls Due to     No Fall Risks  Fall risk Follow up     Falls evaluation completed   Functional Status Survey:    Vitals:   12/13/22 1102  BP: (!) 109/54  Pulse: 96  Resp: 16  Temp: (!) 97.5 F (36.4 C)  TempSrc: Temporal  SpO2: 92%  Weight: 156 lb 8 oz (71 kg)  Height: 5\' 3"  (1.6 m)   Body mass index is 27.72 kg/m. Physical Exam Cardiovascular:     Rate and Rhythm: Normal rate and regular rhythm.     Pulses: Normal pulses.      Heart sounds: Normal heart sounds.  Pulmonary:     Effort: Pulmonary effort is normal. No respiratory distress.     Breath sounds: Normal breath sounds. No wheezing or rales.  Abdominal:     General: Bowel sounds are normal. There is no distension.     Tenderness: There is no abdominal tenderness.  Musculoskeletal:     Comments: Discoloration of both legs Tender to touch    Skin:    Comments: No erythema under left breast  Minimal redness , no macerated skin under Rt breast      Labs reviewed: Recent Labs  02/13/22 0543 02/14/22 0425 02/15/22 0427 02/16/22 0904 02/21/22 0000 05/07/22 0000  NA 141 142 141 139 142 141  K 3.8 3.1* 3.2* 3.4* 3.3* 3.2*  CL 112* 117* 115* 111 108 104  CO2 22 18* 21* 24 27* 29*  GLUCOSE 97 87 100* 100*  --   --   BUN 36* 34* 21 15 11 15   CREATININE 0.87 0.70 0.52 0.60 0.6 0.6  CALCIUM 8.3* 7.9* 8.2* 8.2* 8.1* 8.8  MG 2.3 2.1  --   --   --   --    Recent Labs    05/07/22 0000  AST 12*  ALT 6*  ALKPHOS 100  ALBUMIN 3.3*   Recent Labs    02/14/22 0425 02/15/22 0427 02/16/22 0904 02/21/22 0000 05/07/22 0000  WBC 11.7* 9.3 9.7 6.3 5.0  NEUTROABS  --   --   --  4,082.00 2,815.00  HGB 9.9* 10.4* 10.9* 10.2* 11.4*  HCT 32.6* 34.2* 37.0 32* 36  MCV 85.1 84.7 86.9  --   --   PLT 218 231 282 329 279   Lab Results  Component Value Date   TSH 1.198 02/15/2022   No results found for: "HGBA1C" Lab Results  Component Value Date   CHOL 147 09/27/2021   HDL 48 09/27/2021   LDLCALC 78 09/27/2021   TRIG 131 09/27/2021   CHOLHDL 3.6 06/27/2015    Significant Diagnostic Results in last 30 days:  No results found.  Assessment/Plan There are no diagnoses linked to this encounter. [email or web address]  Family/ staff Communication: ***  Labs/tests ordered:  ***

## 2022-12-15 ENCOUNTER — Encounter: Payer: Self-pay | Admitting: Sports Medicine

## 2022-12-17 ENCOUNTER — Encounter: Payer: Self-pay | Admitting: Nurse Practitioner

## 2022-12-17 ENCOUNTER — Non-Acute Institutional Stay (SKILLED_NURSING_FACILITY): Admitting: Nurse Practitioner

## 2022-12-17 DIAGNOSIS — Z9359 Other cystostomy status: Secondary | ICD-10-CM

## 2022-12-17 DIAGNOSIS — E876 Hypokalemia: Secondary | ICD-10-CM | POA: Diagnosis not present

## 2022-12-17 DIAGNOSIS — U071 COVID-19: Secondary | ICD-10-CM | POA: Diagnosis not present

## 2022-12-17 DIAGNOSIS — I471 Supraventricular tachycardia, unspecified: Secondary | ICD-10-CM | POA: Diagnosis not present

## 2022-12-17 DIAGNOSIS — G2581 Restless legs syndrome: Secondary | ICD-10-CM

## 2022-12-17 NOTE — Assessment & Plan Note (Signed)
Stable, on ProAir, Albuterol neb.

## 2022-12-17 NOTE — Assessment & Plan Note (Signed)
hoarseness voice, sleepy, generalized weakness, tested positive for COVID, desires Paxlovid. Denied headache, sore throat, nasal congestion, cough, chest pain, SOB, or phlegm production. Afebrile, no O2 desaturation. Paxlovid 300/100mg  bid po x 5 days, [id number] 05/07/22.

## 2022-12-17 NOTE — Assessment & Plan Note (Signed)
 K 3.2 05/07/22, taking  Kcl

## 2022-12-17 NOTE — Progress Notes (Unsigned)
Location:   SNF [facility] Room Number: 29A Place of Service:  SNF (31) Provider: Arna Snipe Jayanth Szczesniak NP  Arvell Pulsifer X, NP  Patient Care Team: Idolina Mantell X, NP as PCP - General (Internal Medicine) Ernesto Rutherford, MD as Consulting Physician (Ophthalmology) Jake Bathe, MD as Consulting Physician (Cardiology) Venancio Poisson, MD as Consulting Physician (Dermatology) Ranee Gosselin, MD as Consulting Physician (Orthopedic Surgery) Eugenia Mcalpine, MD (Inactive) as Consulting Physician (Orthopedic Surgery) Bradly Bienenstock, MD as Consulting Physician (Orthopedic Surgery) [name] Renie Stelmach X, NP as Nurse Practitioner (Nurse Practitioner) York Spaniel, MD (Inactive) as Consulting Physician (Neurology)  Extended Emergency Contact Information Primary Emergency Contact: [name],[name] Address: [address]          [address]          [place], [place] [address] [place] of [place] Home Phone: [phone] Relation: Sister Secondary Emergency Contact: [place] Home Phone: [phone] Mobile Phone: [phone] Relation: Sister  Code Status: DNR Goals of care: Advanced Directive information    12/17/2022    3:54 PM  Advanced Directives  Does Patient Have a Medical Advance Directive? Yes  Type of Advance Directive Out of facility DNR (pink MOST or yellow form)  Does patient want to make changes to medical advance directive? No - Patient declined  Pre-existing out of facility DNR order (yellow form or pink MOST form) Yellow form placed in chart (order not valid for inpatient use);Pink MOST form placed in chart (order not valid for inpatient use)     Chief Complaint  Patient presents with   Acute Visit    COVID    HPI:  Pt is a 89 y.o. female seen today for an acute visit for hoarseness voice, sleepy, generalized weakness, tested positive for COVID, desires Paxlovid. Denied headache, sore throat, nasal congestion, cough, chest  pain, SOB, or phlegm production. Afebrile, no O2 desaturation.   02/12/22 Obstructive nephritis, underwent cystoscopy with right retrograde pyelogram, right ureteral stent placed, f/u Urology Dr. Berneice Heinrich, 03/06/22 cystoscopy with retrograde pyelogram, ureteroscopy and stent replacement, SPC exchange.                05/03/22 abd [place] no renal or gallbladder stone or hydronephrosis. 05/02/22 Xray abd may represent ileus. Hx of Ureteral stone with hydronephrosis per CT abd                          05/13/22 Urology: R ureteral stone, sp R ureteroscopy stone free 03/2022, stent. SP  urethral stricture, on Oxybutynin. Hx of Ureteral stone with hydronephrosis per CT abd             External hemorrhoids, placed on Colace             Hypokalemia, K 3.2 05/07/22, taking  Kcl             Anemia, Hgb 11.4 05/07/22             SVT, takes Coreg             BLE edema, not apparent, off Furosemide.              Adult failure to thrive, under Hospice service.              Suprapubic Catheter, f/u Urology, off Uribel, on Oxybutynin.              Depression, stable, off Lorazepam  Dementia, under hospice service             Restless leg syndrome, progressing, noted occurring in the afternoon now from at night only, taking MiraPex.              Hx of seizures, no active seizures, off meds.              COPD, on ProAir, Albuterol neb.        Past Medical History:  Diagnosis Date   Acute bronchitis 05/23/2011   Acute upper respiratory infections of unspecified site 05/23/2011   Arthritis    Chronic airway obstruction, not elsewhere classified 05/23/2011   Coronary artery disease    Disturbance of salivary secretion 01/31/2011   Dizziness and giddiness 01/31/2011   Dyspnea    Essential tremor 04/25/2014   External hemorrhoids without mention of complication 01/31/2011   Gait disorder 04/25/2014   GERD (gastroesophageal reflux disease)    History of kidney stones    Insomnia, unspecified 09/12/2011    Lumbago 01/31/2011   Major depressive disorder, single episode, unspecified 01/31/2011   Memory disorder 04/25/2014   Mitral valve disorders(424.0) 01/31/2011   Other and unspecified hyperlipidemia 01/31/2011   Other convulsions 01/31/2011   Other emphysema (HCC) 01/31/2011   Pain in joint, site unspecified 01/31/2011   Restless legs syndrome (RLS) 09/12/2011   Retinal detachment with retinal defect of right eye 2011   right eye twice   Seizure disorder (HCC)    Senile osteoporosis 01/31/2011   Spontaneous ecchymoses 01/31/2011   Squamous cell carcinoma of leg    Stiffness of joints, not elsewhere classified, multiple sites 01/31/2011   Unspecified essential hypertension 01/31/2011   Past Surgical History:  Procedure Laterality Date   ABDOMINAL HYSTERECTOMY  06/21/2003   TAH/BSO, omenectomy PSB resect, Stg IC cystadenofibroma   CHOLECYSTECTOMY  2005   Dr. Carolynne Edouard   CYSTOSCOPY W/ URETERAL STENT PLACEMENT Right 02/13/2022   Procedure: CYSTOSCOPY WITH RETROGRADE PYELOGRAM/URETERAL STENT PLACEMENT, AND SUPRAPUBIC TUBE EXCHANGE;  Surgeon: Sebastian Ache, MD;  Location: [facility];  Service: Urology;  Laterality: Right;   CYSTOSCOPY WITH RETROGRADE PYELOGRAM, URETEROSCOPY AND STENT PLACEMENT Right 03/06/2022   Procedure: CYSTOSCOPY WITH RETROGRADE PYELOGRAM, URETEROSCOPY AND STENT REPLACEMENT, SUPRAPUBIC CATHETER EXCHANGE;  Surgeon: Sebastian Ache, MD;  Location: [facility];  Service: Urology;  Laterality: Right;   ELBOW SURGERY Right 2008   broken   Dr. Orlan Leavens   EYE SURGERY     HOLMIUM LASER APPLICATION Right 03/06/2022   Procedure: HOLMIUM LASER APPLICATION;  Surgeon: Sebastian Ache, MD;  Location: [facility];  Service: Urology;  Laterality: Right;   INTRAMEDULLARY (IM) NAIL INTERTROCHANTERIC Left 08/30/2021   Procedure: INTRAMEDULLARY (IM) NAIL INTERTROCHANTRIC;  Surgeon: Samson Frederic, MD;  Location: [facility];  Service: Orthopedics;  Laterality: Left;   ORIF PATELLA Left 05/02/2020   Procedure:  OPEN REDUCTION INTERNAL (ORIF) FIXATION LEFT PATELLA WITH MEDIAL AND LATERAL LIGAMENT REINFORCEMENTS;  Surgeon: Sheral Apley, MD;  Location: [facility];  Service: Orthopedics;  Laterality: Left;   ORIF PATELLA Left 05/30/2020   Procedure: OPEN REDUCTION INTERNAL (ORIF) FIXATION PATELLA;  Surgeon: Sheral Apley, MD;  Location: [facility];  Service: Orthopedics;  Laterality: Left;   RETINAL DETACHMENT SURGERY N/A    two   REVERSE SHOULDER ARTHROPLASTY Left 05/06/2019   Procedure: REVERSE SHOULDER ARTHROPLASTY;  Surgeon: Francena Hanly, MD;  Location: [facility];  Service: Orthopedics;  Laterality: Left;    ROTATOR CUFF REPAIR Right 2012   Dr. Thomasena Edis   SQUAMOUS  CELL CARCINOMA EXCISION Bilateral 2012, 8/14   [place] on legs   Dr. Irene Limbo   TONSILLECTOMY  1941   VIDEO BRONCHOSCOPY WITH ENDOBRONCHIAL NAVIGATION N/A 11/29/2015   Procedure: VIDEO BRONCHOSCOPY WITH ENDOBRONCHIAL NAVIGATION;  Surgeon: Leslye Peer, MD;  Location: [facility];  Service: Thoracic;  Laterality: N/A;    Allergies  Allergen Reactions   Vioxx [Rofecoxib] Shortness Of Breath   Dyazide [Hydrochlorothiazide W-Triamterene] Other (See Comments)    Lowers blood pressure too much   Latex Swelling and Other (See Comments)    "Allergic," per [facility]   Sulfa Antibiotics Nausea And Vomiting and Other (See Comments)    "Allergic," per [facility]   Sumycin [Tetracycline] Other (See Comments)    Can't take due to drug reaction- "Allergic," per [facility]    Allergies as of 12/17/2022       Reactions   Vioxx [rofecoxib] Shortness Of Breath   Dyazide [hydrochlorothiazide W-triamterene] Other (See Comments)   Lowers blood pressure too much   Latex Swelling, Other (See Comments)   "Allergic," per [facility]   Sulfa Antibiotics Nausea And Vomiting, Other (See Comments)   "Allergic," per [facility]   Sumycin [tetracycline] Other (See Comments)   Can't take due to drug reaction- "Allergic," per [facility]        Medication List        Accurate as of December 17, 2022 11:59 PM. If you have any questions, ask your nurse or doctor.          acetaminophen 325 MG tablet Commonly known as: TYLENOL Take 650 mg by mouth every 6 (six) hours as needed for fever or mild pain. Give 2 tablet by mouth every 6 hours as needed for Pain   acetic acid 0.25 % irrigation Irrigate with 1 Application as directed 2 (two) times daily.   carvedilol 3.125 MG tablet Commonly known as: COREG Take 1 tablet (3.125 mg total) by mouth 2 (two) times daily with a meal.   docusate sodium 100 MG capsule Commonly known as: COLACE Take 100 mg by mouth as needed for mild constipation.   guaiFENesin 200 MG/10ML Liqd Take 10 mLs by mouth every 6 (six) hours as needed (Cough). Give 10 ml by mouth every 6 hours as needed for Cough for 2 Days Notify MD is continued cough.   Morphine Sulfate (Concentrate) 5 MG/0.25ML Soln Take 0.25 mLs by mouth every 8 (eight) hours as needed.   Nyamyc powder Generic drug: nystatin Apply 1 Application topically daily as needed.   Orajel 2X Toothache & Gum 20-0.26 % Gel Generic drug: Benzocaine-Menthol Use as directed 1 Dose in the mouth or throat every 6 (six) hours as needed (Tooth Pain).   oxybutynin 5 MG tablet Commonly known as: DITROPAN Take 5 mg by mouth 2 (two) times daily.   potassium chloride 10 MEQ CR capsule Commonly known as: MICRO-K Take 10 mEq by mouth daily.   pramipexole 0.5 MG tablet Commonly known as: MIRAPEX Take 0.5 mg by mouth at bedtime.   ProAir RespiClick 108 (90 Base) MCG/ACT Aepb Generic drug: Albuterol Sulfate Inhale 2 puffs into the lungs daily as needed (shortness of breath or wheezing).   albuterol 1.25 MG/3ML nebulizer solution Commonly known as: ACCUNEB Take 1 ampule by nebulization every 6 (six) hours as needed for shortness of breath.        Review of Systems  Constitutional:  Positive for fatigue. Negative for fever.  HENT:  Positive for hearing loss and voice change. Negative for  congestion, sore  throat and trouble swallowing.   Eyes:  Negative for visual disturbance.  Respiratory:  Negative for shortness of breath.        DOE is chronic  Cardiovascular:  Negative for leg swelling.  Gastrointestinal:  Negative for abdominal pain and constipation.       Hemorrhoids from previous examination.   Genitourinary:  Positive for difficulty urinating.       SPC, right mid back pain lasted a few hours 2 days ago.   Musculoskeletal:  Positive for arthralgias and gait problem.       S/p left hip ORIF, ORIF of the left patella.   Skin:  Negative for color change.       BLE discoloration, chronic venous insufficiency skin changes. Left shin skin lesions.    Neurological:  Negative for seizures, weakness and headaches.       Memory lapses. Hx of seizures. RLS  Psychiatric/Behavioral:  Negative for behavioral problems and sleep disturbance. The patient is not nervous/anxious.     Immunization History  Administered Date(s) Administered   Influenza Split 01/06/2014, 01/15/2017, 01/08/2018, 12/09/2018   Influenza Whole 01/07/2012, 01/06/2013   Influenza, High Dose Seasonal PF 12/25/2015, 01/20/2017   Influenza,inj,Quad PF,6+ Mos 12/21/2014   Influenza-Unspecified 12/09/2018, 01/18/2020, 01/24/2021, 01/30/2022   Moderna SARS-COV2 Booster Vaccination 04/02/2021   Moderna Sars-Covid-2 Vaccination 04/12/2019, 06/05/2019, 02/15/2020, 09/05/2020   PFIZER(Purple Top)SARS-COV-2 Vaccination 12/26/2020   PPD Test 02/16/2022   Pneumococcal Conjugate-13 02/01/2014   Pneumococcal Polysaccharide-23 12/20/1992, 01/15/2000, 04/08/2004, 06/08/2004   Rsv, Bivalent, Protein Subunit Rsvpref,pf Verdis Frederickson) 04/26/2022   Td 04/08/2002, 04/22/2002   Tdap 04/09/2011, 08/17/2015   Zoster Recombinant(Shingrix) 05/29/2005, 08/27/2017, 06/13/2022   Zoster, Live 04/08/2008, 05/29/2014, 05/28/2017   Zoster, Unspecified 05/29/2005   Pertinent  Health Maintenance Due  Topic Date Due   INFLUENZA VACCINE   11/07/2022   DEXA SCAN  Completed   MAMMOGRAM  Discontinued      02/15/2022    3:00 PM 02/15/2022    9:00 PM 02/16/2022    8:00 AM 03/06/2022    7:25 AM 05/20/2022    3:42 PM  Fall Risk  Falls in the past year?     0  Was there an injury with Fall?     0  Fall Risk Category Calculator     0  (RETIRED) Patient Fall Risk Level Low fall risk Moderate fall risk Moderate fall risk High fall risk   Patient at Risk for Falls Due to     No Fall Risks  Fall risk Follow up     Falls evaluation completed   Functional Status Survey:    Vitals:   12/17/22 1546  BP: 124/72  Pulse: 90  Resp: 16  Temp: (!) 97.3 F (36.3 C)  SpO2: 99%  Weight: 156 lb 8 oz (71 kg)  Height: 5\' 3"  (1.6 m)   Body mass index is 27.72 kg/m. Physical Exam Vitals and nursing note reviewed.  Constitutional:      Comments: Sleepy, fatigue.   HENT:     Head: Normocephalic and atraumatic.     Nose: Nose normal. No congestion or rhinorrhea.     Mouth/Throat:     Mouth: Mucous membranes are moist.     Pharynx: Posterior oropharyngeal erythema present.  Eyes:     Extraocular Movements: Extraocular movements intact.     Conjunctiva/sclera: Conjunctivae normal.     Right eye: Right conjunctiva is not injected.     Left eye: Left conjunctiva is not injected.  Pupils: Pupils are equal, round, and reactive to light.  Neck:     Comments: Left adam's apple bony aspect, sometimes feels soreness on palpation is chronic, no noted lymph nodes  Cardiovascular:     Rate and Rhythm: Normal rate and regular rhythm.     Heart sounds: Murmur heard.     Comments: PD pulses are not felt from previous examination.  Pulmonary:     Effort: Pulmonary effort is normal.     Breath sounds: No rales.     Comments: Decreased air entry to both lungs.  Abdominal:     General: Bowel sounds are normal.     Palpations: Abdomen is soft.     Tenderness: There is no abdominal tenderness.     Comments: Mid abd surgical scar. SPC   Genitourinary:    Comments: SPC. External hemorrhoids x2, no injury, bleeding, or pain  Musculoskeletal:     Cervical back: Normal range of motion and neck supple.     Right lower leg: No edema.     Left lower leg: No edema.     Comments: Decreased overhead ROM of the left shoulder. Left knee s/p ORIF of the patella fx.   Skin:    General: Skin is warm and dry.     Findings: Lesion present.     Comments: BLE discoloration, chronic venous insufficiency skin changes. Left shin skin lesions-opted out workup or tx.    Neurological:     General: No focal deficit present.     Mental Status: She is alert. Mental status is at baseline.     Gait: Gait abnormal.  Psychiatric:        Mood and Affect: Mood normal.        Behavior: Behavior normal.     Labs reviewed: Recent Labs    02/13/22 0543 02/14/22 0425 02/15/22 0427 02/16/22 0904 02/21/22 0000 05/07/22 0000  NA 141 142 141 139 142 141  K 3.8 3.1* 3.2* 3.4* 3.3* 3.2*  CL 112* 117* 115* 111 108 104  CO2 22 18* 21* 24 27* 29*  GLUCOSE 97 87 100* 100*  --   --   BUN 36* 34* 21 15 11 15   CREATININE 0.87 0.70 0.52 0.60 0.6 0.6  CALCIUM 8.3* 7.9* 8.2* 8.2* 8.1* 8.8  MG 2.3 2.1  --   --   --   --    Recent Labs    05/07/22 0000  AST 12*  ALT 6*  ALKPHOS 100  ALBUMIN 3.3*   Recent Labs    02/14/22 0425 02/15/22 0427 02/16/22 0904 02/21/22 0000 05/07/22 0000  WBC 11.7* 9.3 9.7 6.3 5.0  NEUTROABS  --   --   --  4,082.00 2,815.00  HGB 9.9* 10.4* 10.9* 10.2* 11.4*  HCT 32.6* 34.2* 37.0 32* 36  MCV 85.1 84.7 86.9  --   --   PLT 218 231 282 329 279   Lab Results  Component Value Date   TSH 1.198 02/15/2022   No results found for: "HGBA1C" Lab Results  Component Value Date   CHOL 147 09/27/2021   HDL 48 09/27/2021   LDLCALC 78 09/27/2021   TRIG 131 09/27/2021   CHOLHDL 3.6 06/27/2015    Significant Diagnostic Results in last 30 days:  No results found.  Assessment/Plan: COVID-19 virus infection   hoarseness voice, sleepy, generalized weakness, tested positive for COVID, desires Paxlovid. Denied headache, sore throat, nasal congestion, cough, chest pain, SOB, or phlegm production. Afebrile, no O2 desaturation. Paxlovid  300/100mg  bid po x 5 days, [id number] 05/07/22.   Hypokalemia K 3.2 05/07/22, taking  Kcl  SVT (supraventricular tachycardia) Heart rate is in control,  takes Coreg  Suprapubic catheter [facility]) f/u Urology, off Uribel, on Oxybutynin.   Restless legs syndrome (RLS)  progressing, noted occurring in the afternoon now from at night only, taking MiraPex.   COPD (chronic obstructive pulmonary disease) (HCC) Stable, on ProAir, Albuterol neb.     Family/ staff Communication: plan of care reviewed with the patient and charge nurse.   Labs/tests ordered:  none  Time spend 35 minutes.

## 2022-12-17 NOTE — Assessment & Plan Note (Signed)
Heart rate is in control,  takes Coreg 

## 2022-12-17 NOTE — Assessment & Plan Note (Signed)
 progressing, noted occurring in the afternoon now from at night only, taking MiraPex.

## 2022-12-17 NOTE — Assessment & Plan Note (Signed)
f/u Urology, off Uribel, on Oxybutynin.  

## 2023-01-02 ENCOUNTER — Encounter: Payer: Self-pay | Admitting: Nurse Practitioner

## 2023-01-02 ENCOUNTER — Non-Acute Institutional Stay (SKILLED_NURSING_FACILITY): Payer: Medicare PPO | Admitting: Nurse Practitioner

## 2023-01-02 DIAGNOSIS — Z66 Do not resuscitate: Secondary | ICD-10-CM | POA: Diagnosis not present

## 2023-01-02 DIAGNOSIS — J431 Panlobular emphysema: Secondary | ICD-10-CM

## 2023-01-02 DIAGNOSIS — R569 Unspecified convulsions: Secondary | ICD-10-CM | POA: Diagnosis not present

## 2023-01-02 DIAGNOSIS — G2581 Restless legs syndrome: Secondary | ICD-10-CM

## 2023-01-02 DIAGNOSIS — Z9359 Other cystostomy status: Secondary | ICD-10-CM

## 2023-01-02 DIAGNOSIS — I471 Supraventricular tachycardia, unspecified: Secondary | ICD-10-CM

## 2023-01-02 DIAGNOSIS — E876 Hypokalemia: Secondary | ICD-10-CM

## 2023-01-02 NOTE — Assessment & Plan Note (Signed)
takes Coreg

## 2023-01-02 NOTE — Assessment & Plan Note (Signed)
Hx of seizures, no active seizures, off meds.

## 2023-01-02 NOTE — Assessment & Plan Note (Signed)
progressing, noted occurring in the afternoon now from at night only, taking MiraPex.

## 2023-01-02 NOTE — Progress Notes (Signed)
Location:  [name] Room Number: N029-A Place of Service:  SNF (31) Provider:  Kellin Fifer X, NP  Patient Care Team: Falisa Lamora X, NP as PCP - General (Internal Medicine) Ernesto Rutherford, MD as Consulting Physician (Ophthalmology) Jake Bathe, MD as Consulting Physician (Cardiology) Venancio Poisson, MD as Consulting Physician (Dermatology) Ranee Gosselin, MD as Consulting Physician (Orthopedic Surgery) Eugenia Mcalpine, MD (Inactive) as Consulting Physician (Orthopedic Surgery) Bradly Bienenstock, MD as Consulting Physician (Orthopedic Surgery) [name] Ishaq Maffei X, NP as Nurse Practitioner (Nurse Practitioner) York Spaniel, MD (Inactive) as Consulting Physician (Neurology)  Extended Emergency Contact Information Primary Emergency Contact: [name],[name] Address: [address]          [address]          [place], [place] [address] [place] of [place] Home Phone: [phone] Relation: Sister Secondary Emergency Contact: [place] Home Phone: [phone] Mobile Phone: [phone] Relation: Sister  Code Status:  DNR Goals of care: Advanced Directive information    01/02/2023    9:37 AM  Advanced Directives  Does Patient Have a Medical Advance Directive? Yes  Type of Advance Directive Out of facility DNR (pink MOST or yellow form)  Does patient want to make changes to medical advance directive? No - Patient declined  Pre-existing out of facility DNR order (yellow form or pink MOST form) Yellow form placed in chart (order not valid for inpatient use);Pink MOST form placed in chart (order not valid for inpatient use)     Chief Complaint  Patient presents with   Medical Management of Chronic Issues    Routine visit and discuss need for flu vaccine     HPI:  Pt is a 89 y.o. female seen today for medical management of chronic diseases.      02/12/22 Obstructive nephritis, underwent cystoscopy with right  retrograde pyelogram, right ureteral stent placed, f/u Urology Dr. Berneice Heinrich, 03/06/22 cystoscopy with retrograde pyelogram, ureteroscopy and stent replacement, SPC exchange.                05/03/22 abd [place] no renal or gallbladder stone or hydronephrosis. 05/02/22 Xray abd may represent ileus. Hx of Ureteral stone with hydronephrosis per CT abd                          05/13/22 Urology: R ureteral stone, sp R ureteroscopy stone free 03/2022, stent. SP  urethral stricture, on Oxybutynin. Hx of Ureteral stone with hydronephrosis per CT abd             External hemorrhoids, placed on Colace             Hypokalemia, K 3.2 05/07/22, taking  Kcl             Anemia, Hgb 11.4 05/07/22             SVT, takes Coreg             BLE edema, not apparent, off Furosemide.              Adult failure to thrive, under Hospice service.              Suprapubic Catheter, f/u Urology, off Uribel, on Oxybutynin. SPC was not working appropriately yesterday, but ok today, had nausea and vomiting yesterday, resolved today, she attributed the symptoms to her anxiety, declined labs.  Depression, stable, off Lorazepam             Dementia, under hospice service             Restless leg syndrome, progressing, noted occurring in the afternoon now from at night only, taking MiraPex.              Hx of seizures, no active seizures, off meds.              COPD, on ProAir, Albuterol neb.      Past Medical History:  Diagnosis Date   Acute bronchitis 05/23/2011   Acute upper respiratory infections of unspecified site 05/23/2011   Arthritis    Chronic airway obstruction, not elsewhere classified 05/23/2011   Coronary artery disease    Disturbance of salivary secretion 01/31/2011   Dizziness and giddiness 01/31/2011   Dyspnea    Essential tremor 04/25/2014   External hemorrhoids without mention of complication 01/31/2011   Gait disorder 04/25/2014   GERD (gastroesophageal reflux disease)    History of kidney stones     Insomnia, unspecified 09/12/2011   Lumbago 01/31/2011   Major depressive disorder, single episode, unspecified 01/31/2011   Memory disorder 04/25/2014   Mitral valve disorders(424.0) 01/31/2011   Other and unspecified hyperlipidemia 01/31/2011   Other convulsions 01/31/2011   Other emphysema (HCC) 01/31/2011   Pain in joint, site unspecified 01/31/2011   Restless legs syndrome (RLS) 09/12/2011   Retinal detachment with retinal defect of right eye 2011   right eye twice   Seizure disorder (HCC)    Senile osteoporosis 01/31/2011   Spontaneous ecchymoses 01/31/2011   Squamous cell carcinoma of leg    Stiffness of joints, not elsewhere classified, multiple sites 01/31/2011   Unspecified essential hypertension 01/31/2011   Past Surgical History:  Procedure Laterality Date   ABDOMINAL HYSTERECTOMY  06/21/2003   TAH/BSO, omenectomy PSB resect, Stg IC cystadenofibroma   CHOLECYSTECTOMY  2005   Dr. Carolynne Edouard   CYSTOSCOPY W/ URETERAL STENT PLACEMENT Right 02/13/2022   Procedure: CYSTOSCOPY WITH RETROGRADE PYELOGRAM/URETERAL STENT PLACEMENT, AND SUPRAPUBIC TUBE EXCHANGE;  Surgeon: Sebastian Ache, MD;  Location: [facility];  Service: Urology;  Laterality: Right;   CYSTOSCOPY WITH RETROGRADE PYELOGRAM, URETEROSCOPY AND STENT PLACEMENT Right 03/06/2022   Procedure: CYSTOSCOPY WITH RETROGRADE PYELOGRAM, URETEROSCOPY AND STENT REPLACEMENT, SUPRAPUBIC CATHETER EXCHANGE;  Surgeon: Sebastian Ache, MD;  Location: [facility];  Service: Urology;  Laterality: Right;   ELBOW SURGERY Right 2008   broken   Dr. Orlan Leavens   EYE SURGERY     HOLMIUM LASER APPLICATION Right 03/06/2022   Procedure: HOLMIUM LASER APPLICATION;  Surgeon: Sebastian Ache, MD;  Location: [facility];  Service: Urology;  Laterality: Right;   INTRAMEDULLARY (IM) NAIL INTERTROCHANTERIC Left 08/30/2021   Procedure: INTRAMEDULLARY (IM) NAIL INTERTROCHANTRIC;  Surgeon: Samson Frederic, MD;  Location: [facility];  Service: Orthopedics;  Laterality: Left;   ORIF  PATELLA Left 05/02/2020   Procedure: OPEN REDUCTION INTERNAL (ORIF) FIXATION LEFT PATELLA WITH MEDIAL AND LATERAL LIGAMENT REINFORCEMENTS;  Surgeon: Sheral Apley, MD;  Location: [facility];  Service: Orthopedics;  Laterality: Left;   ORIF PATELLA Left 05/30/2020   Procedure: OPEN REDUCTION INTERNAL (ORIF) FIXATION PATELLA;  Surgeon: Sheral Apley, MD;  Location: [facility];  Service: Orthopedics;  Laterality: Left;   RETINAL DETACHMENT SURGERY N/A    two   REVERSE SHOULDER ARTHROPLASTY Left 05/06/2019   Procedure: REVERSE SHOULDER ARTHROPLASTY;  Surgeon: Francena Hanly, MD;  Location: [facility];  Service: Orthopedics;  Laterality: Left;   ROTATOR CUFF REPAIR Right 2012   Dr. Thomasena Edis   SQUAMOUS CELL CARCINOMA EXCISION Bilateral 2012, 8/14   [place] on legs   Dr. Irene Limbo   TONSILLECTOMY  1941   VIDEO BRONCHOSCOPY WITH ENDOBRONCHIAL NAVIGATION N/A 11/29/2015   Procedure: VIDEO BRONCHOSCOPY WITH ENDOBRONCHIAL NAVIGATION;  Surgeon: Leslye Peer, MD;  Location: [facility];  Service: Thoracic;  Laterality: N/A;    Allergies  Allergen Reactions   Vioxx [Rofecoxib] Shortness Of Breath   Dyazide [Hydrochlorothiazide W-Triamterene] Other (See Comments)    Lowers blood pressure too much   Latex Swelling and Other (See Comments)    "Allergic," per [facility]   Sulfa Antibiotics Nausea And Vomiting and Other (See Comments)    "Allergic," per [facility]   Sumycin [Tetracycline] Other (See Comments)    Can't take due to drug reaction- "Allergic," per [facility]    Outpatient Encounter Medications as of 01/02/2023  Medication Sig   acetaminophen (TYLENOL) 325 MG tablet Take 650 mg by mouth every 6 (six) hours as needed for fever or mild pain. Give 2 tablet by mouth every 6 hours as needed for Pain   acetic acid 0.25 % irrigation Irrigate with 1 Application as directed 2 (two) times daily.   albuterol (ACCUNEB) 1.25 MG/3ML nebulizer solution Take 1 ampule by nebulization every 6 (six) hours as needed for shortness of breath.    Albuterol Sulfate (PROAIR RESPICLICK) 108 (90 Base) MCG/ACT AEPB Inhale 2 puffs into the lungs daily as needed (shortness of breath or wheezing).   Benzocaine-Menthol (ORAJEL 2X TOOTHACHE & GUM) 20-0.26 % GEL Use as directed 1 Dose in the mouth or throat every 6 (six) hours as needed (Tooth Pain).   carvedilol (COREG) 3.125 MG tablet Take 1 tablet (3.125 mg total) by mouth 2 (two) times daily with a meal.   docusate sodium (COLACE) 100 MG capsule Take 100 mg by mouth as needed for mild constipation.   mirtazapine (REMERON) 7.5 MG tablet Take 7.5 mg by mouth daily.   Morphine Sulfate, Concentrate, 5 MG/0.25ML SOLN Take 0.25 mLs by mouth every 8 (eight) hours as needed.   NYAMYC powder Apply 1 Application topically daily as needed.   oxybutynin (DITROPAN) 5 MG tablet Take 5 mg by mouth 2 (two) times daily.   potassium chloride (MICRO-K) 10 MEQ CR capsule Take 10 mEq by mouth daily.   pramipexole (MIRAPEX) 0.5 MG tablet Take 0.5 mg by mouth at bedtime.   promethazine (PHENERGAN) 25 MG suppository Place 25 mg rectally every 6 (six) hours as needed for nausea (For 1 Day Clear liquid diet x 24hrs. In NOT effective, notify MD.).   [DISCONTINUED] guaiFENesin 200 MG/10ML LIQD Take 10 mLs by mouth every 6 (six) hours as needed (Cough). Give 10 ml by mouth every 6 hours as needed for Cough for 2 Days Notify MD is continued cough.   No facility-administered encounter medications on file as of 01/02/2023.    Review of Systems  Constitutional:  Negative for appetite change, fatigue and fever.  HENT:  Positive for hearing loss and voice change. Negative for congestion, sore throat and trouble swallowing.   Eyes:  Negative for visual disturbance.  Respiratory:  Negative for shortness of breath.        DOE is chronic  Cardiovascular:  Negative for leg swelling.  Gastrointestinal:  Negative for abdominal pain and constipation.       Hemorrhoids from previous examination.   Genitourinary:  Positive for  difficulty urinating.       SPC  Musculoskeletal:  Positive for arthralgias and gait problem.       S/p left hip ORIF, ORIF of the left patella.   Skin:  Negative for color change.       BLE discoloration, chronic venous insufficiency skin changes. Left shin skin lesions.    Neurological:  Negative for seizures, weakness and headaches.       Memory lapses. Hx of seizures. RLS  Psychiatric/Behavioral:  Negative for behavioral problems and sleep disturbance. The patient is not nervous/anxious.     Immunization History  Administered Date(s) Administered   Influenza Split 01/06/2014, 01/15/2017, 01/08/2018, 12/09/2018   Influenza Whole 01/07/2012, 01/06/2013   Influenza, High Dose Seasonal PF 12/25/2015, 01/20/2017   Influenza,inj,Quad PF,6+ Mos 12/21/2014   Influenza-Unspecified 12/09/2018, 01/18/2020, 01/24/2021, 01/30/2022   Moderna SARS-COV2 Booster Vaccination 04/02/2021   Moderna Sars-Covid-2 Vaccination 04/12/2019, 06/05/2019, 02/15/2020, 09/05/2020   PFIZER(Purple Top)SARS-COV-2 Vaccination 12/26/2020   PPD Test 02/16/2022   Pneumococcal Conjugate-13 02/01/2014   Pneumococcal Polysaccharide-23 12/20/1992, 01/15/2000, 04/08/2004, 06/08/2004   Rsv, Bivalent, Protein Subunit Rsvpref,pf Verdis Frederickson) 04/26/2022   Td 04/08/2002, 04/22/2002   Tdap 04/09/2011, 08/17/2015   Zoster Recombinant(Shingrix) 05/29/2005, 08/27/2017, 06/13/2022   Zoster, Live 04/08/2008, 05/29/2014, 05/28/2017   Zoster, Unspecified 05/29/2005   Pertinent  Health Maintenance Due  Topic Date Due   INFLUENZA VACCINE  11/07/2022   DEXA SCAN  Completed   MAMMOGRAM  Discontinued      02/15/2022    3:00 PM 02/15/2022    9:00 PM 02/16/2022    8:00 AM 03/06/2022    7:25 AM 05/20/2022    3:42 PM  Fall Risk  Falls in the past year?     0  Was there an injury with Fall?     0  Fall Risk Category Calculator     0  (RETIRED) Patient Fall Risk Level Low fall risk Moderate fall risk Moderate fall risk High fall risk    Patient at Risk for Falls Due to     No Fall Risks  Fall risk Follow up     Falls evaluation completed   Functional Status Survey:    Vitals:   01/02/23 0935  BP: 123/87  Pulse: 93  SpO2: 92%  Weight: 156 lb (70.8 kg)  Height: 5\' 3"  (1.6 m)   Body mass index is 27.63 kg/m. Physical Exam Vitals and nursing note reviewed.  Constitutional:      Appearance: Normal appearance.  HENT:     Head: Normocephalic and atraumatic.     Nose: Nose normal.     Mouth/Throat:     Mouth: Mucous membranes are moist.     Pharynx: No posterior oropharyngeal erythema.  Eyes:     Extraocular Movements: Extraocular movements intact.     Conjunctiva/sclera: Conjunctivae normal.     Right eye: Right conjunctiva is not injected.     Left eye: Left conjunctiva is not injected.     Pupils: Pupils are equal, round, and reactive to light.  Neck:     Comments: Left adam's apple bony aspect, sometimes feels soreness on palpation is chronic, no noted lymph nodes  Cardiovascular:     Rate and Rhythm: Normal rate and regular rhythm.     Heart sounds: Murmur heard.     Comments: PD pulses are not felt from previous examination.  Pulmonary:     Effort: Pulmonary effort is normal.     Breath sounds: No rales.     Comments: Decreased air entry to both lungs.  Abdominal:  General: Bowel sounds are normal.     Palpations: Abdomen is soft.     Tenderness: There is no abdominal tenderness.     Comments: Mid abd surgical scar. SPC  Genitourinary:    Comments: SPC. External hemorrhoids x2, no injury, bleeding, or pain  Musculoskeletal:     Cervical back: Normal range of motion and neck supple.     Right lower leg: No edema.     Left lower leg: No edema.     Comments: Decreased overhead ROM of the left shoulder. Left knee s/p ORIF of the patella fx.   Skin:    General: Skin is warm and dry.     Findings: Lesion present.     Comments: BLE discoloration, chronic venous insufficiency skin changes. Left  shin skin lesions-opted out workup or tx.    Neurological:     General: No focal deficit present.     Mental Status: She is alert. Mental status is at baseline.     Gait: Gait abnormal.  Psychiatric:        Mood and Affect: Mood normal.        Behavior: Behavior normal.     Labs reviewed: Recent Labs    02/13/22 0543 02/14/22 0425 02/15/22 0427 02/16/22 0904 02/21/22 0000 05/07/22 0000  NA 141 142 141 139 142 141  K 3.8 3.1* 3.2* 3.4* 3.3* 3.2*  CL 112* 117* 115* 111 108 104  CO2 22 18* 21* 24 27* 29*  GLUCOSE 97 87 100* 100*  --   --   BUN 36* 34* 21 15 11 15   CREATININE 0.87 0.70 0.52 0.60 0.6 0.6  CALCIUM 8.3* 7.9* 8.2* 8.2* 8.1* 8.8  MG 2.3 2.1  --   --   --   --    Recent Labs    05/07/22 0000  AST 12*  ALT 6*  ALKPHOS 100  ALBUMIN 3.3*   Recent Labs    02/14/22 0425 02/15/22 0427 02/16/22 0904 02/21/22 0000 05/07/22 0000  WBC 11.7* 9.3 9.7 6.3 5.0  NEUTROABS  --   --   --  4,082.00 2,815.00  HGB 9.9* 10.4* 10.9* 10.2* 11.4*  HCT 32.6* 34.2* 37.0 32* 36  MCV 85.1 84.7 86.9  --   --   PLT 218 231 282 329 279   Lab Results  Component Value Date   TSH 1.198 02/15/2022   No results found for: "HGBA1C" Lab Results  Component Value Date   CHOL 147 09/27/2021   HDL 48 09/27/2021   LDLCALC 78 09/27/2021   TRIG 131 09/27/2021   CHOLHDL 3.6 06/27/2015    Significant Diagnostic Results in last 30 days:  No results found.  Assessment/Plan Suprapubic catheter [facility])  f/u Urology, off Uribel, on Oxybutynin. SPC was not working appropriately yesterday, but ok today, had nausea and vomiting yesterday, resolved today, she attributed the symptoms to her anxiety, declined labs.   Restless legs syndrome (RLS) progressing, noted occurring in the afternoon now from at night only, taking MiraPex.   Seizure (HCC) Hx of seizures, no active seizures, off meds.   COPD (chronic obstructive pulmonary disease) (HCC) Stable,  on ProAir, Albuterol neb.     SVT  (supraventricular tachycardia) takes Coreg  Hypokalemia takes Futures trader Communication: plan of care reviewed with the patient and charge nurse.   Labs/tests ordered:  none  Time spend 35 minutes.

## 2023-01-02 NOTE — Assessment & Plan Note (Signed)
Stable, on ProAir, Albuterol neb.

## 2023-01-02 NOTE — Assessment & Plan Note (Addendum)
f/u Urology, off Uribel, on Oxybutynin. SPC was not working appropriately yesterday, but ok today, had nausea and vomiting yesterday, resolved today, she attributed the symptoms to her anxiety, declined labs.   05/13/22 Urology: R ureteral stone, sp R ureteroscopy stone free 03/2022, stent. SP  urethral stricture, on Oxybutynin. Hx of Ureteral stone with hydronephrosis per CT abd

## 2023-01-09 ENCOUNTER — Non-Acute Institutional Stay (SKILLED_NURSING_FACILITY): Payer: Medicare PPO | Admitting: Nurse Practitioner

## 2023-01-09 ENCOUNTER — Encounter: Payer: Self-pay | Admitting: Nurse Practitioner

## 2023-01-09 DIAGNOSIS — D638 Anemia in other chronic diseases classified elsewhere: Secondary | ICD-10-CM

## 2023-01-09 DIAGNOSIS — E876 Hypokalemia: Secondary | ICD-10-CM

## 2023-01-09 DIAGNOSIS — I471 Supraventricular tachycardia, unspecified: Secondary | ICD-10-CM

## 2023-01-09 DIAGNOSIS — G2581 Restless legs syndrome: Secondary | ICD-10-CM

## 2023-01-09 DIAGNOSIS — L219 Seborrheic dermatitis, unspecified: Secondary | ICD-10-CM

## 2023-01-09 DIAGNOSIS — R627 Adult failure to thrive: Secondary | ICD-10-CM

## 2023-01-09 NOTE — Progress Notes (Signed)
Location:  [name] Room Number: N029-A Place of Service:  SNF (31) Provider:  Shaymus Eveleth X, NP  Patient Care Team: Gurshan Settlemire X, NP as PCP - General (Internal Medicine) Ernesto Rutherford, MD as Consulting Physician (Ophthalmology) Jake Bathe, MD as Consulting Physician (Cardiology) Venancio Poisson, MD as Consulting Physician (Dermatology) Ranee Gosselin, MD as Consulting Physician (Orthopedic Surgery) Eugenia Mcalpine, MD (Inactive) as Consulting Physician (Orthopedic Surgery) Bradly Bienenstock, MD as Consulting Physician (Orthopedic Surgery) [name] Joellen Tullos X, NP as Nurse Practitioner (Nurse Practitioner) York Spaniel, MD (Inactive) as Consulting Physician (Neurology)  Extended Emergency Contact Information Primary Emergency Contact: [name],[name] Address: [address]          [id number]          [place], [place] [address] [place] of [place] Home Phone: [phone] Relation: Sister Secondary Emergency Contact: Rocky [place] Home Phone: [phone] Mobile Phone: [phone] Relation: Sister  Code Status:  DNR Goals of care: Advanced Directive information    01/09/2023   10:11 AM  Advanced Directives  Does Patient Have a Medical Advance Directive? Yes  Type of Advance Directive Out of facility DNR (pink MOST or yellow form)  Does patient want to make changes to medical advance directive? No - Patient declined  Pre-existing out of facility DNR order (yellow form or pink MOST form) Yellow form placed in chart (order not valid for inpatient use);Pink MOST form placed in chart (order not valid for inpatient use)     Chief Complaint  Patient presents with  . Acute Visit    Rash on left side of face     HPI:  Pt is a 89 y.o. female seen today for an acute visit for R face, chin, neck scattered itching papules, scaly, no blistering appearance or pain noted.       02/12/22 Obstructive nephritis,  underwent cystoscopy with right retrograde pyelogram, right ureteral stent placed, f/u Urology Dr. Berneice Heinrich, 03/06/22 cystoscopy with retrograde pyelogram, ureteroscopy and stent replacement, SPC exchange.                05/03/22 abd [place] no renal or gallbladder stone or hydronephrosis. 05/02/22 Xray abd may represent ileus. Hx of Ureteral stone with hydronephrosis per CT abd                          05/13/22 Urology: R ureteral stone, sp R ureteroscopy stone free 03/2022, stent. SPC  urethral stricture, on Oxybutynin. Hx of Ureteral stone with hydronephrosis per CT abd             External hemorrhoids, placed on Colace             Hypokalemia, K 3.2 05/07/22, taking  Kcl             Anemia, Hgb 11.4 05/07/22             SVT, takes Coreg             BLE edema, not apparent, off Furosemide.              Adult failure to thrive, under Hospice service.              Suprapubic Catheter, f/u Urology, off Uribel, on Oxybutynin.              Depression, stable, off Lorazepam  Dementia, under hospice service             Restless leg syndrome,  taking MiraPex.              Hx of seizures, no active seizures, off meds.              COPD, on ProAir, Albuterol neb.     Past Medical History:  Diagnosis Date  . Acute bronchitis 05/23/2011  . Acute upper respiratory infections of unspecified site 05/23/2011  . Arthritis   . Chronic airway obstruction, not elsewhere classified 05/23/2011  . Coronary artery disease   . Disturbance of salivary secretion 01/31/2011  . Dizziness and giddiness 01/31/2011  . Dyspnea   . Essential tremor 04/25/2014  . External hemorrhoids without mention of complication 01/31/2011  . Gait disorder 04/25/2014  . GERD (gastroesophageal reflux disease)   . History of kidney stones   . Insomnia, unspecified 09/12/2011  . Lumbago 01/31/2011  . Major depressive disorder, single episode, unspecified 01/31/2011  . Memory disorder 04/25/2014  . Mitral valve disorders(424.0)  01/31/2011  . Other and unspecified hyperlipidemia 01/31/2011  . Other convulsions 01/31/2011  . Other emphysema (HCC) 01/31/2011  . Pain in joint, site unspecified 01/31/2011  . Restless legs syndrome (RLS) 09/12/2011  . Retinal detachment with retinal defect of right eye 2011   right eye twice  . Seizure disorder (HCC)   . Senile osteoporosis 01/31/2011  . Spontaneous ecchymoses 01/31/2011  . Squamous cell carcinoma of leg   . Stiffness of joints, not elsewhere classified, multiple sites 01/31/2011  . Unspecified essential hypertension 01/31/2011   Past Surgical History:  Procedure Laterality Date  . ABDOMINAL HYSTERECTOMY  06/21/2003   TAH/BSO, omenectomy PSB resect, Stg IC cystadenofibroma  . CHOLECYSTECTOMY  2005   Dr. Carolynne Edouard  . CYSTOSCOPY W/ URETERAL STENT PLACEMENT Right 02/13/2022   Procedure: CYSTOSCOPY WITH RETROGRADE PYELOGRAM/URETERAL STENT PLACEMENT, AND SUPRAPUBIC TUBE EXCHANGE;  Surgeon: Sebastian Ache, MD;  Location: [facility];  Service: Urology;  Laterality: Right;  . CYSTOSCOPY WITH RETROGRADE PYELOGRAM, URETEROSCOPY AND STENT PLACEMENT Right 03/06/2022   Procedure: CYSTOSCOPY WITH RETROGRADE PYELOGRAM, URETEROSCOPY AND STENT REPLACEMENT, SUPRAPUBIC CATHETER EXCHANGE;  Surgeon: Sebastian Ache, MD;  Location: [facility];  Service: Urology;  Laterality: Right;  . ELBOW SURGERY Right 2008   broken   Dr. Orlan Leavens  . EYE SURGERY    . HOLMIUM LASER APPLICATION Right 03/06/2022   Procedure: HOLMIUM LASER APPLICATION;  Surgeon: Sebastian Ache, MD;  Location: [facility];  Service: Urology;  Laterality: Right;  . INTRAMEDULLARY (IM) NAIL INTERTROCHANTERIC Left 08/30/2021   Procedure: INTRAMEDULLARY (IM) NAIL INTERTROCHANTRIC;  Surgeon: Samson Frederic, MD;  Location: [facility];  Service: Orthopedics;  Laterality: Left;  . ORIF PATELLA Left 05/02/2020   Procedure: OPEN REDUCTION INTERNAL (ORIF) FIXATION LEFT PATELLA WITH MEDIAL AND LATERAL LIGAMENT REINFORCEMENTS;  Surgeon: Sheral Apley,  MD;  Location: [facility];  Service: Orthopedics;  Laterality: Left;  . ORIF PATELLA Left 05/30/2020   Procedure: OPEN REDUCTION INTERNAL (ORIF) FIXATION PATELLA;  Surgeon: Sheral Apley, MD;  Location: [facility];  Service: Orthopedics;  Laterality: Left;  . RETINAL DETACHMENT SURGERY N/A    two  . REVERSE SHOULDER ARTHROPLASTY Left 05/06/2019   Procedure: REVERSE SHOULDER ARTHROPLASTY;  Surgeon: Francena Hanly, MD;  Location: [facility];  Service: Orthopedics;  Laterality: Left;   . ROTATOR CUFF REPAIR Right 2012   Dr. Thomasena Edis  . SQUAMOUS CELL CARCINOMA EXCISION Bilateral 2012, 8/14   [place] on legs  Dr. Irene Limbo  . TONSILLECTOMY  1941  . VIDEO BRONCHOSCOPY WITH ENDOBRONCHIAL NAVIGATION N/A 11/29/2015   Procedure: VIDEO BRONCHOSCOPY WITH ENDOBRONCHIAL NAVIGATION;  Surgeon: Leslye Peer, MD;  Location: [facility];  Service: Thoracic;  Laterality: N/A;    Allergies  Allergen Reactions  . Vioxx [Rofecoxib] Shortness Of Breath  . Dyazide [Hydrochlorothiazide W-Triamterene] Other (See Comments)    Lowers blood pressure too much  . Latex Swelling and Other (See Comments)    "Allergic," per [facility]  . Sulfa Antibiotics Nausea And Vomiting and Other (See Comments)    "Allergic," per MAR  . Sumycin [Tetracycline] Other (See Comments)    Can't take due to drug reaction- "Allergic," per [facility]    Outpatient Encounter Medications as of 01/09/2023  Medication Sig  . acetaminophen (TYLENOL) 325 MG tablet Take 650 mg by mouth every 6 (six) hours as needed for fever or mild pain. Give 2 tablet by mouth every 6 hours as needed for Pain  . acetic acid 0.25 % irrigation Irrigate with 1 Application as directed 2 (two) times daily.  [name] albuterol (ACCUNEB) 1.25 MG/3ML nebulizer solution Take 1 ampule by nebulization every 6 (six) hours as needed for shortness of breath.  . Albuterol Sulfate (PROAIR RESPICLICK) 108 (90 Base) MCG/ACT AEPB Inhale 2 puffs into the lungs daily as needed (shortness of breath or wheezing).   . Benzocaine-Menthol (ORAJEL 2X TOOTHACHE & GUM) 20-0.26 % GEL Use as directed 1 Dose in the mouth or throat every 6 (six) hours as needed (Tooth Pain).  . carvedilol (COREG) 3.125 MG tablet Take 1 tablet (3.125 mg total) by mouth 2 (two) times daily with a meal.  . docusate sodium (COLACE) 100 MG capsule Take 100 mg by mouth as needed for mild constipation.  . mirtazapine (REMERON) 7.5 MG tablet Take 7.5 mg by mouth daily.  . Morphine Sulfate, Concentrate, 5 MG/0.25ML SOLN Take 0.25 mLs by mouth every 8 (eight) hours as needed.  [name] NYAMYC powder Apply 1 Application topically daily as needed.  [name] oxybutynin (DITROPAN) 5 MG tablet Take 5 mg by mouth 2 (two) times daily.  . potassium chloride (MICRO-K) 10 MEQ CR capsule Take 10 mEq by mouth daily.  . pramipexole (MIRAPEX) 0.5 MG tablet Take 0.5 mg by mouth at bedtime.  . [DISCONTINUED] promethazine (PHENERGAN) 25 MG suppository Place 25 mg rectally every 6 (six) hours as needed for nausea (For 1 Day Clear liquid diet x 24hrs. In NOT effective, notify MD.).   No facility-administered encounter medications on file as of 01/09/2023.    Review of Systems  Constitutional:  Negative for appetite change, fatigue and fever.  HENT:  Positive for hearing loss. Negative for congestion and trouble swallowing.   Eyes:  Negative for visual disturbance.  Respiratory:  Negative for shortness of breath.        DOE is chronic  Cardiovascular:  Negative for leg swelling.  Gastrointestinal:  Negative for abdominal pain and constipation.       Hemorrhoids from previous examination.   Genitourinary:  Positive for difficulty urinating.       [facility]  Musculoskeletal:  Positive for arthralgias and gait problem.       S/p left hip ORIF, ORIF of the left patella.   Skin:  Positive for rash.       BLE discoloration, chronic venous insufficiency skin changes. Left shin skin lesions. R face, chin, neck red papules, scaly, itching.    Neurological:  Negative for seizures,  weakness and headaches.  Memory lapses. Hx of seizures. RLS  Psychiatric/Behavioral:  Negative for behavioral problems and sleep disturbance. The patient is not nervous/anxious.     Immunization History  Administered Date(s) Administered  . Influenza Split 01/06/2014, 01/15/2017, 01/08/2018, 12/09/2018  . Influenza Whole 01/07/2012, 01/06/2013  . Influenza, High Dose Seasonal PF 12/25/2015, 01/20/2017, 01/08/2023  . Influenza,inj,Quad PF,6+ Mos 12/21/2014  . Influenza-Unspecified 12/09/2018, 01/18/2020, 01/24/2021, 01/30/2022  . Moderna SARS-COV2 Booster Vaccination 04/02/2021  . Moderna Sars-Covid-2 Vaccination 04/12/2019, 06/05/2019, 02/15/2020, 09/05/2020  . PFIZER(Purple Top)SARS-COV-2 Vaccination 12/26/2020  . PPD Test 02/16/2022  . Pneumococcal Conjugate-13 02/01/2014  . Pneumococcal Polysaccharide-23 12/20/1992, 01/15/2000, 04/08/2004, 06/08/2004  . Rsv, Bivalent, Protein Subunit Rsvpref,pf Verdis Frederickson) 04/26/2022  . Td 04/08/2002, 04/22/2002  . Tdap 04/09/2011, 08/17/2015  . Zoster Recombinant(Shingrix) 05/29/2005, 08/27/2017, 06/13/2022  . Zoster, Live 04/08/2008, 05/29/2014, 05/28/2017  . Zoster, Unspecified 05/29/2005   Pertinent  Health Maintenance Due  Topic Date Due  . INFLUENZA VACCINE  Completed  . DEXA SCAN  Completed  . MAMMOGRAM  Discontinued      02/15/2022    3:00 PM 02/15/2022    9:00 PM 02/16/2022    8:00 AM 03/06/2022    7:25 AM 05/20/2022    3:42 PM  Fall Risk  Falls in the past year?     0  Was there an injury with Fall?     0  Fall Risk Category Calculator     0  (RETIRED) Patient Fall Risk Level Low fall risk Moderate fall risk Moderate fall risk High fall risk   Patient at Risk for Falls Due to     No Fall Risks  Fall risk Follow up     Falls evaluation completed   Functional Status Survey:    Vitals:   01/09/23 1009  BP: 122/69  Pulse: 91  Resp: 18  Temp: (!) 97.2 F (36.2 C)  SpO2: 95%  Weight: 158 lb 14.4 oz (72.1 kg)  Height:  5\' 3"  (1.6 m)   Body mass index is 28.15 kg/m. Physical Exam Vitals and nursing note reviewed.  Constitutional:      Appearance: Normal appearance.  HENT:     Head: Normocephalic and atraumatic.     Nose: Nose normal.     Mouth/Throat:     Mouth: Mucous membranes are moist.     Pharynx: No posterior oropharyngeal erythema.  Eyes:     Extraocular Movements: Extraocular movements intact.     Conjunctiva/sclera: Conjunctivae normal.     Right eye: Right conjunctiva is not injected.     Left eye: Left conjunctiva is not injected.     Pupils: Pupils are equal, round, and reactive to light.  Neck:     Comments: Left adam's apple bony aspect, sometimes feels soreness on palpation is chronic, no noted lymph nodes  Cardiovascular:     Rate and Rhythm: Normal rate and regular rhythm.     Heart sounds: Murmur heard.     Comments: PD pulses are not felt from previous examination.  Pulmonary:     Effort: Pulmonary effort is normal.     Breath sounds: No rales.     Comments: Decreased air entry to both lungs.  Abdominal:     General: Bowel sounds are normal.     Palpations: Abdomen is soft.     Tenderness: There is no abdominal tenderness.     Comments: Mid abd surgical scar. SPC  Genitourinary:    Comments: SPC. External hemorrhoids x2, no injury, bleeding, or pain  Musculoskeletal:  Cervical back: Normal range of motion and neck supple.     Right lower leg: No edema.     Left lower leg: No edema.     Comments: Decreased overhead ROM of the left shoulder. Left knee s/p ORIF of the patella fx.   Skin:    General: Skin is warm and dry.     Findings: Lesion and rash present.     Comments: BLE discoloration, chronic venous insufficiency skin changes. Left shin skin lesions-opted out workup or tx.  R face, chin, neck red papules, scaly, itching.     Neurological:     General: No focal deficit present.     Mental Status: She is alert. Mental status is at baseline.     Gait: Gait  abnormal.  Psychiatric:        Mood and Affect: Mood normal.        Behavior: Behavior normal.    Labs reviewed: Recent Labs    02/13/22 0543 02/14/22 0425 02/15/22 0427 02/16/22 0904 02/21/22 0000 05/07/22 0000  NA 141 142 141 139 142 141  K 3.8 3.1* 3.2* 3.4* 3.3* 3.2*  CL 112* 117* 115* 111 108 104  CO2 22 18* 21* 24 27* 29*  GLUCOSE 97 87 100* 100*  --   --   BUN 36* 34* 21 15 11 15   CREATININE 0.87 0.70 0.52 0.60 0.6 0.6  CALCIUM 8.3* 7.9* 8.2* 8.2* 8.1* 8.8  MG 2.3 2.1  --   --   --   --    Recent Labs    05/07/22 0000  AST 12*  ALT 6*  ALKPHOS 100  ALBUMIN 3.3*   Recent Labs    02/14/22 0425 02/15/22 0427 02/16/22 0904 02/21/22 0000 05/07/22 0000  WBC 11.7* 9.3 9.7 6.3 5.0  NEUTROABS  --   --   --  4,082.00 2,815.00  HGB 9.9* 10.4* 10.9* 10.2* 11.4*  HCT 32.6* 34.2* 37.0 32* 36  MCV 85.1 84.7 86.9  --   --   PLT 218 231 282 329 279   Lab Results  Component Value Date   TSH 1.198 02/15/2022   No results found for: "HGBA1C" Lab Results  Component Value Date   CHOL 147 09/27/2021   HDL 48 09/27/2021   LDLCALC 78 09/27/2021   TRIG 131 09/27/2021   CHOLHDL 3.6 06/27/2015    Significant Diagnostic Results in last 30 days:  No results found.  Assessment/Plan Seborrheic dermatitis R face, chin, neck scattered itching papules, scaly, no blistering appearance or pain noted. Apply 2% Ketoconazole/1% Hydrocortisone cream bid to affected areas x 1 weeks.   Hypokalemia  K 3.2 05/07/22, taking  Kcl  Anemia, chronic disease Hgb 11.4 05/07/22  SVT (supraventricular tachycardia) Heart rate is in control,  takes Coreg  Adult failure to thrive Comfort measures, under hospice service  Restless legs syndrome (RLS) Managed with MiraPex     Family/ staff Communication: plan of care reviewed with the patient and charge nurse.   Labs/tests ordered:  none  Time spend 25 minutes.

## 2023-01-09 NOTE — Assessment & Plan Note (Signed)
Comfort measures, under hospice service

## 2023-01-09 NOTE — Assessment & Plan Note (Signed)
Heart rate is in control,  takes Coreg 

## 2023-01-09 NOTE — Assessment & Plan Note (Signed)
Hgb 11.4 05/07/22

## 2023-01-09 NOTE — Assessment & Plan Note (Signed)
 K 3.2 05/07/22, taking  Kcl

## 2023-01-09 NOTE — Assessment & Plan Note (Signed)
Managed with MiraPex

## 2023-01-09 NOTE — Assessment & Plan Note (Signed)
R face, chin, neck scattered itching papules, scaly, no blistering appearance or pain noted. Apply 2% Ketoconazole/1% Hydrocortisone cream bid to affected areas x 1 weeks.

## 2023-02-03 ENCOUNTER — Non-Acute Institutional Stay (INDEPENDENT_AMBULATORY_CARE_PROVIDER_SITE_OTHER): Payer: Self-pay | Admitting: Nurse Practitioner

## 2023-02-03 DIAGNOSIS — Z0000 Encounter for general adult medical examination without abnormal findings: Secondary | ICD-10-CM

## 2023-02-04 ENCOUNTER — Encounter: Payer: Self-pay | Admitting: Nurse Practitioner

## 2023-02-04 NOTE — Progress Notes (Unsigned)
Subjective:   [name] is a 89 y.o. female who presents for Medicare Annual (Subsequent) preventive examination.  Visit Complete: In person  Patient Medicare AWV questionnaire was completed by the patient on 02/02/33; I have confirmed that all information answered by patient is correct and no changes since this date.        Objective:    Today's Vitals   02/03/23 1425  BP: 133/89  Pulse: 88  Resp: 18  Temp: (!) 97 F (36.1 C)  SpO2: 95%  Weight: 158 lb 14.4 oz (72.1 kg)   Body mass index is 28.15 kg/m.     01/09/2023   10:11 AM 01/02/2023    9:37 AM 12/17/2022    3:54 PM 12/13/2022   11:05 AM 12/10/2022   11:49 AM 11/15/2022   10:03 AM 08/08/2022    8:41 AM  Advanced Directives  Does Patient Have a Medical Advance Directive? Yes Yes Yes Yes Yes Yes Yes  Type of Advance Directive Out of facility DNR (pink MOST or yellow form) Out of facility DNR (pink MOST or yellow form) Out of facility DNR (pink MOST or yellow form) Out of facility DNR (pink MOST or yellow form) Out of facility DNR (pink MOST or yellow form) Out of facility DNR (pink MOST or yellow form) Out of facility DNR (pink MOST or yellow form)  Does patient want to make changes to medical advance directive? No - Patient declined No - Patient declined No - Patient declined No - Patient declined No - Patient declined No - Patient declined No - Patient declined  Pre-existing out of facility DNR order (yellow form or pink MOST form) Yellow form placed in chart (order not valid for inpatient use);Pink MOST form placed in chart (order not valid for inpatient use) Yellow form placed in chart (order not valid for inpatient use);Pink MOST form placed in chart (order not valid for inpatient use) Yellow form placed in chart (order not valid for inpatient use);Pink MOST form placed in chart (order not valid for inpatient use) Yellow form placed in chart (order not valid for inpatient use);Pink MOST form placed in chart (order not  valid for inpatient use) Yellow form placed in chart (order not valid for inpatient use);Pink MOST form placed in chart (order not valid for inpatient use) Pink MOST form placed in chart (order not valid for inpatient use);Yellow form placed in chart (order not valid for inpatient use) Pink MOST form placed in chart (order not valid for inpatient use);Yellow form placed in chart (order not valid for inpatient use)    Current Medications (verified) Outpatient Encounter Medications as of 02/03/2023  Medication Sig   acetaminophen (TYLENOL) 325 MG tablet Take 650 mg by mouth every 6 (six) hours as needed for fever or mild pain. Give 2 tablet by mouth every 6 hours as needed for Pain   acetic acid 0.25 % irrigation Irrigate with 1 Application as directed 2 (two) times daily.   albuterol (ACCUNEB) 1.25 MG/3ML nebulizer solution Take 1 ampule by nebulization every 6 (six) hours as needed for shortness of breath.   Albuterol Sulfate (PROAIR RESPICLICK) 108 (90 Base) MCG/ACT AEPB Inhale 2 puffs into the lungs daily as needed (shortness of breath or wheezing).   Benzocaine-Menthol (ORAJEL 2X TOOTHACHE & GUM) 20-0.26 % GEL Use as directed 1 Dose in the mouth or throat every 6 (six) hours as needed (Tooth Pain).   carvedilol (COREG) 3.125 MG tablet Take 1 tablet (3.125 mg total) by mouth  2 (two) times daily with a meal.   docusate sodium (COLACE) 100 MG capsule Take 100 mg by mouth as needed for mild constipation.   mirtazapine (REMERON) 7.5 MG tablet Take 7.5 mg by mouth daily.   Morphine Sulfate, Concentrate, 5 MG/0.25ML SOLN Take 0.25 mLs by mouth every 8 (eight) hours as needed.   NYAMYC powder Apply 1 Application topically daily as needed.   oxybutynin (DITROPAN) 5 MG tablet Take 5 mg by mouth 2 (two) times daily.   potassium chloride (MICRO-K) 10 MEQ CR capsule Take 10 mEq by mouth daily.   pramipexole (MIRAPEX) 0.5 MG tablet Take 0.5 mg by mouth at bedtime.   No facility-administered encounter  medications on file as of 02/03/2023.    Allergies (verified) Vioxx [rofecoxib], Dyazide [hydrochlorothiazide w-triamterene], Latex, Sulfa antibiotics, and Sumycin [tetracycline]   History: Past Medical History:  Diagnosis Date   Acute bronchitis 05/23/2011   Acute upper respiratory infections of unspecified site 05/23/2011   Arthritis    Chronic airway obstruction, not elsewhere classified 05/23/2011   Coronary artery disease    Disturbance of salivary secretion 01/31/2011   Dizziness and giddiness 01/31/2011   Dyspnea    Essential tremor 04/25/2014   External hemorrhoids without mention of complication 01/31/2011   Gait disorder 04/25/2014   GERD (gastroesophageal reflux disease)    History of kidney stones    Insomnia, unspecified 09/12/2011   Lumbago 01/31/2011   Major depressive disorder, single episode, unspecified 01/31/2011   Memory disorder 04/25/2014   Mitral valve disorders(424.0) 01/31/2011   Other and unspecified hyperlipidemia 01/31/2011   Other convulsions 01/31/2011   Other emphysema (HCC) 01/31/2011   Pain in joint, site unspecified 01/31/2011   Restless legs syndrome (RLS) 09/12/2011   Retinal detachment with retinal defect of right eye 2011   right eye twice   Seizure disorder (HCC)    Senile osteoporosis 01/31/2011   Spontaneous ecchymoses 01/31/2011   Squamous cell carcinoma of leg    Stiffness of joints, not elsewhere classified, multiple sites 01/31/2011   Unspecified essential hypertension 01/31/2011   Past Surgical History:  Procedure Laterality Date   ABDOMINAL HYSTERECTOMY  06/21/2003   TAH/BSO, omenectomy PSB resect, Stg IC cystadenofibroma   CHOLECYSTECTOMY  2005   Dr. Carolynne Edouard   CYSTOSCOPY W/ URETERAL STENT PLACEMENT Right 02/13/2022   Procedure: CYSTOSCOPY WITH RETROGRADE PYELOGRAM/URETERAL STENT PLACEMENT, AND SUPRAPUBIC TUBE EXCHANGE;  Surgeon: Sebastian Ache, MD;  Location: [facility];  Service: Urology;  Laterality: Right;   CYSTOSCOPY WITH  RETROGRADE PYELOGRAM, URETEROSCOPY AND STENT PLACEMENT Right 03/06/2022   Procedure: CYSTOSCOPY WITH RETROGRADE PYELOGRAM, URETEROSCOPY AND STENT REPLACEMENT, SUPRAPUBIC CATHETER EXCHANGE;  Surgeon: Sebastian Ache, MD;  Location: [facility];  Service: Urology;  Laterality: Right;   ELBOW SURGERY Right 2008   broken   Dr. Orlan Leavens   EYE SURGERY     HOLMIUM LASER APPLICATION Right 03/06/2022   Procedure: HOLMIUM LASER APPLICATION;  Surgeon: Sebastian Ache, MD;  Location: [facility];  Service: Urology;  Laterality: Right;   INTRAMEDULLARY (IM) NAIL INTERTROCHANTERIC Left 08/30/2021   Procedure: INTRAMEDULLARY (IM) NAIL INTERTROCHANTRIC;  Surgeon: Samson Frederic, MD;  Location: [facility];  Service: Orthopedics;  Laterality: Left;   ORIF PATELLA Left 05/02/2020   Procedure: OPEN REDUCTION INTERNAL (ORIF) FIXATION LEFT PATELLA WITH MEDIAL AND LATERAL LIGAMENT REINFORCEMENTS;  Surgeon: Sheral Apley, MD;  Location: [facility];  Service: Orthopedics;  Laterality: Left;   ORIF PATELLA Left 05/30/2020   Procedure: OPEN REDUCTION INTERNAL (ORIF) FIXATION PATELLA;  Surgeon: Sheral Apley,  MD;  Location: [facility];  Service: Orthopedics;  Laterality: Left;   RETINAL DETACHMENT SURGERY N/A    two   REVERSE SHOULDER ARTHROPLASTY Left 05/06/2019   Procedure: REVERSE SHOULDER ARTHROPLASTY;  Surgeon: Francena Hanly, MD;  Location: [facility];  Service: Orthopedics;  Laterality: Left;    ROTATOR CUFF REPAIR Right 2012   Dr. Thomasena Edis   SQUAMOUS CELL CARCINOMA EXCISION Bilateral 2012, 8/14   [place] on legs   Dr. Irene Limbo   TONSILLECTOMY  1941   VIDEO BRONCHOSCOPY WITH ENDOBRONCHIAL NAVIGATION N/A 11/29/2015   Procedure: VIDEO BRONCHOSCOPY WITH ENDOBRONCHIAL NAVIGATION;  Surgeon: Leslye Peer, MD;  Location: [facility];  Service: Thoracic;  Laterality: N/A;   Family History  Problem Relation Age of Onset   Heart disease Father        CHF   Cancer Mother        breast   Seizures Sister    Social History   Socioeconomic  History   Marital status: Divorced    Spouse name: Not on file   Number of children: 0   Years of education: Not on file   Highest education level: Not on file  Occupational History   Occupation: retired Programmer, systems for special students  Tobacco Use   Smoking status: Former    Current packs/day: 0.00    Average packs/day: 1 pack/day for 40.0 years (40.0 ttl pk-yrs)    Types: Cigarettes    Start date: 04/08/1948    Quit date: 04/08/1988    Years since quitting: 34.8   Smokeless tobacco: Never   Tobacco comments:    quit in 1990  Vaping Use   Vaping status: Never Used  Substance and Sexual Activity   Alcohol use: No    Alcohol/week: 0.0 standard drinks of alcohol   Drug use: No   Sexual activity: Never  Other Topics Concern   Not on file  Social History Narrative   Lives at [facility] [name]   No children   Divorced   Exercise climbs steps 4 flights daily PT for balance   Alcohol none   Stopped smoking 1989   Patient drinks 3 large sodas daily.   Patient is right handed.   Social Determinants of Health   Financial Resource Strain: Not on file  Food Insecurity: No Food Insecurity (02/12/2022)   Hunger Vital Sign    Worried About Running Out of Food in the Last Year: Never true    Ran Out of Food in the Last Year: Never true  Transportation Needs: No Transportation Needs (02/12/2022)   PRAPARE - Administrator, Civil Service (Medical): No    Lack of Transportation (Non-Medical): No  Physical Activity: Not on file  Stress: Not on file  Social Connections: Not on file    Tobacco Counseling Counseling given: Not Answered Tobacco comments: quit in 1990   Clinical Intake:  Pre-visit preparation completed: Yes  Pain : No/denies pain     BMI - recorded: 28.15 Nutritional Status: BMI 25 -29 Overweight Nutritional Risks: None Diabetes: No  How often do you need to have someone help you when you read instructions, pamphlets, or other written materials  from your doctor or pharmacy?: 3 - Sometimes What is the last grade level you completed in school?: college  Interpreter Needed?: No  Information entered by :: Emmary Culbreath Nedra Hai NP   Activities of Daily Living    02/04/2023   12:46 PM 03/05/2022   11:29 AM  In your present  state of health, do you have any difficulty performing the following activities:  Hearing? 0 0  Vision? 0 0  Difficulty concentrating or making decisions? 1 0  Walking or climbing stairs? 1 1  Dressing or bathing? 1 0  Doing errands, shopping? 1 0  Preparing Food and eating ? N   Using the Toilet? Y   In the past six months, have you accidently leaked urine? Y   Do you have problems with loss of bowel control? Y   Managing your Medications? Y   Managing your Finances? Y   Housekeeping or managing your Housekeeping? Y     Patient Care Team: Janani Chamber X, NP as PCP - General (Internal Medicine) Ernesto Rutherford, MD as Consulting Physician (Ophthalmology) Jake Bathe, MD as Consulting Physician (Cardiology) Venancio Poisson, MD as Consulting Physician (Dermatology) Ranee Gosselin, MD as Consulting Physician (Orthopedic Surgery) Eugenia Mcalpine, MD (Inactive) as Consulting Physician (Orthopedic Surgery) Bradly Bienenstock, MD as Consulting Physician (Orthopedic Surgery) [name] Rieley Khalsa X, NP as Nurse Practitioner (Nurse Practitioner) York Spaniel, MD (Inactive) as Consulting Physician (Neurology)  Indicate any recent Medical Services you may have received from other than [facility] providers in the past year (date may be approximate).     Assessment:   This is a routine wellness examination for Vaeda.  Hearing/Vision screen No results found.   Goals Addressed             This Visit's Progress    Functional Decline Minimized       Evidence-based guidance:  Assess fall risk, including balance and gait impairment, muscle weakness, diminished vision or hearing, environmental hazards, effects  of medication and dehydration.  Assess and review gait speed, decreased muscle strength or impaired functional mobility; consider use of validated tool if available.  Prepare patient for rehabilitation services to develop an individualized activity and exercise program as indicated, such as progressive resistance strengthening, balance and activity tolerance training or home exercise program.  Prevent falls with environmental adjustment; encourage compliance with exercise program, management of incontinence and adequate vitamin D and calcium intake from food or supplements.  Promote gradual increase in intensity of activity and exercise as tolerated, such as duration, frequency, exercise repetition and sets and intensity.  Provide guidance and interventions aimed at fall prevention.  Review or assess presence of reduced muscle mass or results of dual-energy x-ray absorptiometry.   Notes:        Depression Screen    02/10/2023    4:24 PM 02/04/2023   12:47 PM 12/13/2022   11:05 AM 11/04/2021   11:43 PM 01/05/2015    9:26 AM 12/28/2014    9:05 AM 12/21/2014   10:20 AM  PHQ 2/9 Scores  PHQ - 2 Score 0 0 0 2 0 0 0  PHQ- 9 Score    8       Fall Risk    05/20/2022    3:42 PM 03/24/2014    5:23 PM 10/15/2012    2:53 PM  Fall Risk   Falls in the past year? 0 No No  Number falls in past yr: 0    Injury with Fall? 0    Risk for fall due to : No Fall Risks    Follow up Falls evaluation completed      MEDICARE RISK AT HOME: Medicare Risk at Home Any stairs in or around the home?: Yes If so, are there any without handrails?: No Home free of loose throw rugs in  walkways, pet beds, electrical cords, etc?: Yes Adequate lighting in your home to reduce risk of falls?: Yes Life alert?: No Use of a cane, walker or w/c?: Yes Grab bars in the bathroom?: Yes Shower chair or bench in shower?: Yes Elevated toilet seat or a handicapped toilet?: Yes  TIMED UP AND GO:  Was the test performed?  No     Cognitive Function:    11/04/2021    4:26 PM 06/16/2018   10:29 AM 01/26/2016   10:20 AM 04/28/2015   10:54 AM 10/26/2014    9:54 AM  MMSE - Mini Mental State Exam  Not completed:  --     Orientation to time 4 5 5 5 5   Orientation to Place 5 5 5 5 5   Registration  3 3 3 3   Attention/ Calculation 4 5 5 5 5   Recall 3 3 3 3 3   Language- name 2 objects 2 2 2 2 2   Language- repeat 1 1 1 1 1   Language- follow 3 step command  3 3 2 3   Language- read & follow direction  1 1 1 1   Write a sentence  1 1 1 1   Copy design  1 1 1 1   Total score  30 30 29 30         Immunizations Immunization History  Administered Date(s) Administered   Influenza Split 01/06/2014, 01/15/2017, 01/08/2018, 12/09/2018   Influenza Whole 01/07/2012, 01/06/2013   Influenza, High Dose Seasonal PF 12/25/2015, 01/20/2017, 01/08/2023   Influenza,inj,Quad PF,6+ Mos 12/21/2014   Influenza-Unspecified 12/09/2018, 01/18/2020, 01/24/2021, 01/30/2022   Moderna SARS-COV2 Booster Vaccination 04/02/2021   Moderna Sars-Covid-2 Vaccination 04/12/2019, 06/05/2019, 02/15/2020, 09/05/2020   PFIZER(Purple Top)SARS-COV-2 Vaccination 12/26/2020   PPD Test 02/16/2022   Pneumococcal Conjugate-13 02/01/2014   Pneumococcal Polysaccharide-23 12/20/1992, 01/15/2000, 04/08/2004, 06/08/2004   Rsv, Bivalent, Protein Subunit Rsvpref,pf Verdis Frederickson) 04/26/2022   Td 04/08/2002, 04/22/2002   Tdap 04/09/2011, 08/17/2015   Zoster Recombinant(Shingrix) 05/29/2005, 08/27/2017, 06/13/2022   Zoster, Live 04/08/2008, 05/29/2014, 05/28/2017   Zoster, Unspecified 05/29/2005    TDAP status: Up to date  Flu Vaccine status: Up to date  Pneumococcal vaccine status: Up to date  Covid-19 vaccine status: Completed vaccines  Qualifies for Shingles Vaccine? Yes   Zostavax completed Yes   Shingrix Completed?: Yes  Screening Tests Health Maintenance  Topic Date Due   DTaP/Tdap/Td (5 - Td or Tdap) 08/16/2025   Pneumonia Vaccine 32+ Years old   Completed   INFLUENZA VACCINE  Completed   DEXA SCAN  Completed   Zoster Vaccines- Shingrix  Completed   HPV VACCINES  Aged Out   MAMMOGRAM  Discontinued   COVID-19 Vaccine  Discontinued    Health Maintenance  There are no preventive care reminders to display for this patient.  Colorectal cancer screening: No longer required.   Mammogram status: No longer required due to aged out.  Bone Density status: Ordered 02/03/23. Pt provided with contact info and advised to call to schedule appt.  Lung Cancer Screening: (Low Dose CT Chest recommended if Age 69-80 years, 20 pack-year currently smoking OR have quit w/in 15years.) does not qualify.   Lung Cancer Screening Referral: NA  Additional Screening:  Hepatitis C Screening: does not qualify;   Vision Screening: Recommended annual ophthalmology exams for early detection of glaucoma and other disorders of the eye. Is the patient up to date with their annual eye exam?  No  Who is the provider or what is the name of the office in which the patient  attends annual eye exams? [facility] will provide If pt is not established with a provider, would they like to be referred to a provider to establish care? No .   Dental Screening: Recommended annual dental exams for proper oral hygiene  Diabetic Foot Exam: NA  Community Resource Referral / Chronic Care Management: CRR required this visit?  No   CCM required this visit?  No     Plan:     I have personally reviewed and noted the following in the patient's chart:   Medical and social history Use of alcohol, tobacco or illicit drugs  Current medications and supplements including opioid prescriptions. Patient is not currently taking opioid prescriptions. Functional ability and status Nutritional status Physical activity Advanced directives List of other physicians Hospitalizations, surgeries, and ER visits in previous 12 months Vitals Screenings to include cognitive, depression, and  falls Referrals and appointments  In addition, I have reviewed and discussed with patient certain preventive protocols, quality metrics, and best practice recommendations. A written personalized care plan for preventive services as well as general preventive health recommendations were provided to patient.     Eily Louvier X [place] Souter, NP   02/11/2023   After Visit Summary: (In Person-Declined) Patient declined AVS at this time.

## 2023-02-13 ENCOUNTER — Non-Acute Institutional Stay (SKILLED_NURSING_FACILITY): Admitting: Nurse Practitioner

## 2023-02-13 ENCOUNTER — Encounter: Payer: Self-pay | Admitting: Nurse Practitioner

## 2023-02-13 DIAGNOSIS — I471 Supraventricular tachycardia, unspecified: Secondary | ICD-10-CM | POA: Diagnosis not present

## 2023-02-13 DIAGNOSIS — D638 Anemia in other chronic diseases classified elsewhere: Secondary | ICD-10-CM | POA: Diagnosis not present

## 2023-02-13 DIAGNOSIS — E876 Hypokalemia: Secondary | ICD-10-CM | POA: Diagnosis not present

## 2023-02-13 DIAGNOSIS — N139 Obstructive and reflux uropathy, unspecified: Secondary | ICD-10-CM

## 2023-02-13 DIAGNOSIS — Z66 Do not resuscitate: Secondary | ICD-10-CM

## 2023-02-13 DIAGNOSIS — R627 Adult failure to thrive: Secondary | ICD-10-CM

## 2023-02-13 DIAGNOSIS — G2581 Restless legs syndrome: Secondary | ICD-10-CM

## 2023-02-13 NOTE — Assessment & Plan Note (Signed)
taking MiraPex at bedtime and prn.

## 2023-02-13 NOTE — Assessment & Plan Note (Signed)
 K 3.2 05/07/22, taking  Kcl

## 2023-02-13 NOTE — Assessment & Plan Note (Signed)
Hgb 11.4 05/07/22

## 2023-02-13 NOTE — Progress Notes (Addendum)
Location:  [name] Room Number: [phone] A Place of Service:  SNF (31) Provider:  Shreyansh Tiffany X, NP  Patient Care Team: Sarp Vernier X, NP as PCP - General (Internal Medicine) Ernesto Rutherford, MD as Consulting Physician (Ophthalmology) Jake Bathe, MD as Consulting Physician (Cardiology) Venancio Poisson, MD as Consulting Physician (Dermatology) Ranee Gosselin, MD as Consulting Physician (Orthopedic Surgery) Eugenia Mcalpine, MD (Inactive) as Consulting Physician (Orthopedic Surgery) Bradly Bienenstock, MD as Consulting Physician (Orthopedic Surgery) [name] X, NP as Nurse Practitioner (Nurse Practitioner) York Spaniel, MD (Inactive) as Consulting Physician (Neurology)  Extended Emergency Contact Information Primary Emergency Contact: [name],[name] Address: [address]          [id number]          [place], [place] [address] [place] of [place] Home Phone: [phone] Relation: Sister Secondary Emergency Contact: [place] Home Phone: [phone] Mobile Phone: [phone] Relation: Sister  Code Status:  DNR Goals of care: Advanced Directive information    02/13/2023   10:15 AM  Advanced Directives  Does Patient Have a Medical Advance Directive? Yes  Type of Advance Directive Out of facility DNR (pink MOST or yellow form)  Does patient want to make changes to medical advance directive? No - Patient declined  Pre-existing out of facility DNR order (yellow form or pink MOST form) Yellow form placed in chart (order not valid for inpatient use);Pink MOST form placed in chart (order not valid for inpatient use)     Chief Complaint  Patient presents with   Medical Management of Chronic Issues    Routine visit     HPI:  Pt is a 89 y.o. female seen today for medical management of chronic diseases.      02/12/22 Obstructive nephritis, underwent cystoscopy with right retrograde pyelogram, right  ureteral stent placed, f/u Urology Dr. Berneice Heinrich, 03/06/22 cystoscopy with retrograde pyelogram, ureteroscopy and stent replacement, SPC exchange.                05/03/22 abd [place] no renal or gallbladder stone or hydronephrosis. 05/02/22 Xray abd may represent ileus. Hx of Ureteral stone with hydronephrosis per CT abd                          05/13/22 Urology: R ureteral stone, sp R ureteroscopy stone free 03/2022, stent. SPC  urethral stricture, on Oxybutynin. Hx of Ureteral stone with hydronephrosis per CT abd             External hemorrhoids, placed on Colace             Hypokalemia, K 3.2 05/07/22, taking  Kcl             Anemia, Hgb 11.4 05/07/22             SVT, takes Coreg             BLE edema, not apparent, off Furosemide.              Adult failure to thrive, under Hospice service, weight stable.              Suprapubic Catheter, f/u Urology, off Uribel, on Oxybutynin.              Depression, stable, off Lorazepam             Dementia, under hospice service  Restless leg syndrome,  taking MiraPex at bedtime and prn.              Hx of seizures, no active seizures, off meds.              COPD, on ProAir, Albuterol neb.    Past Medical History:  Diagnosis Date   Acute bronchitis 05/23/2011   Acute upper respiratory infections of unspecified site 05/23/2011   Arthritis    Chronic airway obstruction, not elsewhere classified 05/23/2011   Coronary artery disease    Disturbance of salivary secretion 01/31/2011   Dizziness and giddiness 01/31/2011   Dyspnea    Essential tremor 04/25/2014   External hemorrhoids without mention of complication 01/31/2011   Gait disorder 04/25/2014   GERD (gastroesophageal reflux disease)    History of kidney stones    Insomnia, unspecified 09/12/2011   Lumbago 01/31/2011   Major depressive disorder, single episode, unspecified 01/31/2011   Memory disorder 04/25/2014   Mitral valve disorders(424.0) 01/31/2011   Other and unspecified  hyperlipidemia 01/31/2011   Other convulsions 01/31/2011   Other emphysema (HCC) 01/31/2011   Pain in joint, site unspecified 01/31/2011   Restless legs syndrome (RLS) 09/12/2011   Retinal detachment with retinal defect of right eye 2011   right eye twice   Seizure disorder (HCC)    Senile osteoporosis 01/31/2011   Spontaneous ecchymoses 01/31/2011   Squamous cell carcinoma of leg    Stiffness of joints, not elsewhere classified, multiple sites 01/31/2011   Unspecified essential hypertension 01/31/2011   Past Surgical History:  Procedure Laterality Date   ABDOMINAL HYSTERECTOMY  06/21/2003   TAH/BSO, omenectomy PSB resect, Stg IC cystadenofibroma   CHOLECYSTECTOMY  2005   Dr. Carolynne Edouard   CYSTOSCOPY W/ URETERAL STENT PLACEMENT Right 02/13/2022   Procedure: CYSTOSCOPY WITH RETROGRADE PYELOGRAM/URETERAL STENT PLACEMENT, AND SUPRAPUBIC TUBE EXCHANGE;  Surgeon: Sebastian Ache, MD;  Location: [facility];  Service: Urology;  Laterality: Right;   CYSTOSCOPY WITH RETROGRADE PYELOGRAM, URETEROSCOPY AND STENT PLACEMENT Right 03/06/2022   Procedure: CYSTOSCOPY WITH RETROGRADE PYELOGRAM, URETEROSCOPY AND STENT REPLACEMENT, SUPRAPUBIC CATHETER EXCHANGE;  Surgeon: Sebastian Ache, MD;  Location: [facility];  Service: Urology;  Laterality: Right;   ELBOW SURGERY Right 2008   broken   Dr. Orlan Leavens   EYE SURGERY     HOLMIUM LASER APPLICATION Right 03/06/2022   Procedure: HOLMIUM LASER APPLICATION;  Surgeon: Sebastian Ache, MD;  Location: [facility];  Service: Urology;  Laterality: Right;   INTRAMEDULLARY (IM) NAIL INTERTROCHANTERIC Left 08/30/2021   Procedure: INTRAMEDULLARY (IM) NAIL INTERTROCHANTRIC;  Surgeon: Samson Frederic, MD;  Location: [facility];  Service: Orthopedics;  Laterality: Left;   ORIF PATELLA Left 05/02/2020   Procedure: OPEN REDUCTION INTERNAL (ORIF) FIXATION LEFT PATELLA WITH MEDIAL AND LATERAL LIGAMENT REINFORCEMENTS;  Surgeon: Sheral Apley, MD;  Location: [facility];  Service: Orthopedics;  Laterality:  Left;   ORIF PATELLA Left 05/30/2020   Procedure: OPEN REDUCTION INTERNAL (ORIF) FIXATION PATELLA;  Surgeon: Sheral Apley, MD;  Location: [facility];  Service: Orthopedics;  Laterality: Left;   RETINAL DETACHMENT SURGERY N/A    two   REVERSE SHOULDER ARTHROPLASTY Left 05/06/2019   Procedure: REVERSE SHOULDER ARTHROPLASTY;  Surgeon: Francena Hanly, MD;  Location: [facility];  Service: Orthopedics;  Laterality: Left;    ROTATOR CUFF REPAIR Right 2012   Dr. Thomasena Edis   SQUAMOUS CELL CARCINOMA EXCISION Bilateral 2012, 8/14   [place] on legs   Dr. Irene Limbo   TONSILLECTOMY  1941   VIDEO BRONCHOSCOPY WITH ENDOBRONCHIAL  NAVIGATION N/A 11/29/2015   Procedure: VIDEO BRONCHOSCOPY WITH ENDOBRONCHIAL NAVIGATION;  Surgeon: Leslye Peer, MD;  Location: [facility];  Service: Thoracic;  Laterality: N/A;    Allergies  Allergen Reactions   Vioxx [Rofecoxib] Shortness Of Breath   Dyazide [Hydrochlorothiazide W-Triamterene] Other (See Comments)    Lowers blood pressure too much   Latex Swelling and Other (See Comments)    "Allergic," per [facility]   Sulfa Antibiotics Nausea And Vomiting and Other (See Comments)    "Allergic," per [facility]   Sumycin [Tetracycline] Other (See Comments)    Can't take due to drug reaction- "Allergic," per [facility]    Outpatient Encounter Medications as of 02/13/2023  Medication Sig   acetaminophen (TYLENOL) 325 MG tablet Take 650 mg by mouth every 6 (six) hours as needed for fever or mild pain. Give 2 tablet by mouth every 6 hours as needed for Pain   acetic acid 0.25 % irrigation Irrigate with 1 Application as directed 2 (two) times daily.   albuterol (ACCUNEB) 1.25 MG/3ML nebulizer solution Take 1 ampule by nebulization every 6 (six) hours as needed for shortness of breath.   Albuterol Sulfate (PROAIR RESPICLICK) 108 (90 Base) MCG/ACT AEPB Inhale 2 puffs into the lungs daily as needed (shortness of breath or wheezing).   Benzocaine-Menthol (ORAJEL 2X TOOTHACHE & GUM) 20-0.26 % GEL Use as  directed 1 Dose in the mouth or throat every 6 (six) hours as needed (Tooth Pain).   carvedilol (COREG) 3.125 MG tablet Take 1 tablet (3.125 mg total) by mouth 2 (two) times daily with a meal.   docusate sodium (COLACE) 100 MG capsule Take 100 mg by mouth as needed for mild constipation.   ketoconazole (NIZORAL) 2 % shampoo Apply 1 Application topically 2 (two) times a week. Monday and Thursday   mirtazapine (REMERON) 7.5 MG tablet Take 7.5 mg by mouth daily.   Morphine Sulfate, Concentrate, 5 MG/0.25ML SOLN Take 0.25 mLs by mouth every 8 (eight) hours as needed.   NYAMYC powder Apply 1 Application topically daily as needed.   oxybutynin (DITROPAN) 5 MG tablet Take 5 mg by mouth 2 (two) times daily.   potassium chloride (MICRO-K) 10 MEQ CR capsule Take 10 mEq by mouth daily.   pramipexole (MIRAPEX) 0.25 MG tablet Take 0.25 mg by mouth daily. As needed for restless legs, see other bedtime listing   pramipexole (MIRAPEX) 0.5 MG tablet Take 0.5 mg by mouth at bedtime.   No facility-administered encounter medications on file as of 02/13/2023.    Review of Systems  Constitutional:  Negative for appetite change, fatigue and fever.  HENT:  Positive for hearing loss. Negative for congestion and trouble swallowing.   Eyes:  Negative for visual disturbance.  Respiratory:  Negative for shortness of breath.        DOE is chronic  Cardiovascular:  Negative for leg swelling.  Gastrointestinal:  Negative for abdominal pain and constipation.       Hemorrhoids from previous examination.   Genitourinary:  Positive for difficulty urinating.       [facility]  Musculoskeletal:  Positive for arthralgias and gait problem.       S/p left hip ORIF, ORIF of the left patella. Positional pain in the left knee sometimes.   Skin:  Negative for color change.       BLE discoloration, chronic venous insufficiency skin changes. Left shin skin lesions.    Neurological:  Negative for seizures, weakness and headaches.        Memory  lapses. Hx of seizures. RLS  Psychiatric/Behavioral:  Negative for behavioral problems and sleep disturbance. The patient is not nervous/anxious.     Immunization History  Administered Date(s) Administered   Influenza Split 01/06/2014, 01/15/2017, 01/08/2018, 12/09/2018   Influenza Whole 01/07/2012, 01/06/2013   Influenza, High Dose Seasonal PF 12/25/2015, 01/20/2017, 01/08/2023   Influenza,inj,Quad PF,6+ Mos 12/21/2014   Influenza-Unspecified 12/09/2018, 01/18/2020, 01/24/2021, 01/30/2022   Moderna SARS-COV2 Booster Vaccination 04/02/2021   Moderna Sars-Covid-2 Vaccination 04/12/2019, 06/05/2019, 02/15/2020, 09/05/2020   PFIZER(Purple Top)SARS-COV-2 Vaccination 12/26/2020   PPD Test 02/16/2022   Pneumococcal Conjugate-13 02/01/2014   Pneumococcal Polysaccharide-23 12/20/1992, 01/15/2000, 04/08/2004, 06/08/2004   Rsv, Bivalent, Protein Subunit Rsvpref,pf Verdis Frederickson) 04/26/2022   Td 04/08/2002, 04/22/2002   Tdap 04/09/2011, 08/17/2015   Zoster Recombinant(Shingrix) 05/29/2005, 08/27/2017, 06/13/2022   Zoster, Live 04/08/2008, 05/29/2014, 05/28/2017   Zoster, Unspecified 05/29/2005   Pertinent  Health Maintenance Due  Topic Date Due   INFLUENZA VACCINE  Completed   DEXA SCAN  Completed   MAMMOGRAM  Discontinued      02/15/2022    3:00 PM 02/15/2022    9:00 PM 02/16/2022    8:00 AM 03/06/2022    7:25 AM 05/20/2022    3:42 PM  Fall Risk  Falls in the past year?     0  Was there an injury with Fall?     0  Fall Risk Category Calculator     0  (RETIRED) Patient Fall Risk Level Low fall risk Moderate fall risk Moderate fall risk High fall risk   Patient at Risk for Falls Due to     No Fall Risks  Fall risk Follow up     Falls evaluation completed   Functional Status Survey:    Vitals:   02/13/23 1013  BP: 137/87  Pulse: 78  Weight: 158 lb 14.4 oz (72.1 kg)  Height: 5\' 3"  (1.6 m)   Body mass index is 28.15 kg/m. Physical Exam Vitals and nursing note reviewed.   Constitutional:      Appearance: Normal appearance.  HENT:     Head: Normocephalic and atraumatic.     Nose: Nose normal.     Mouth/Throat:     Mouth: Mucous membranes are moist.     Pharynx: No posterior oropharyngeal erythema.  Eyes:     Extraocular Movements: Extraocular movements intact.     Conjunctiva/sclera: Conjunctivae normal.     Right eye: Right conjunctiva is not injected.     Left eye: Left conjunctiva is not injected.     Pupils: Pupils are equal, round, and reactive to light.  Neck:     Comments: Left adam's apple bony aspect, sometimes feels soreness on palpation is chronic, no noted lymph nodes  Cardiovascular:     Rate and Rhythm: Normal rate and regular rhythm.     Heart sounds: Murmur heard.     Comments: PD pulses are not felt from previous examination.  Pulmonary:     Effort: Pulmonary effort is normal.     Breath sounds: No rales.     Comments: Decreased air entry to both lungs.  Abdominal:     General: Bowel sounds are normal.     Palpations: Abdomen is soft.     Tenderness: There is no abdominal tenderness.     Comments: Mid abd surgical scar. SPC  Genitourinary:    Comments: SPC. External hemorrhoids x2, no injury, bleeding, or pain  Musculoskeletal:     Cervical back: Normal range of motion and neck supple.  Right lower leg: No edema.     Left lower leg: No edema.     Comments: Decreased overhead ROM of the left shoulder. Left knee s/p ORIF of the patella fx.   Skin:    General: Skin is warm and dry.     Findings: Lesion present.     Comments: BLE discoloration, chronic venous insufficiency skin changes. Left shin skin lesions-opted out workup or tx.      Neurological:     General: No focal deficit present.     Mental Status: She is alert. Mental status is at baseline.     Gait: Gait abnormal.  Psychiatric:        Mood and Affect: Mood normal.        Behavior: Behavior normal.     Labs reviewed: Recent Labs    02/15/22 0427  02/16/22 0904 02/21/22 0000 05/07/22 0000  NA 141 139 142 141  K 3.2* 3.4* 3.3* 3.2*  CL 115* 111 108 104  CO2 21* 24 27* 29*  GLUCOSE 100* 100*  --   --   BUN 21 15 11 15   CREATININE 0.52 0.60 0.6 0.6  CALCIUM 8.2* 8.2* 8.1* 8.8   Recent Labs    05/07/22 0000  AST 12*  ALT 6*  ALKPHOS 100  ALBUMIN 3.3*   Recent Labs    02/15/22 0427 02/16/22 0904 02/21/22 0000 05/07/22 0000  WBC 9.3 9.7 6.3 5.0  NEUTROABS  --   --  4,082.00 2,815.00  HGB 10.4* 10.9* 10.2* 11.4*  HCT 34.2* 37.0 32* 36  MCV 84.7 86.9  --   --   PLT 231 282 329 279   Lab Results  Component Value Date   TSH 1.198 02/15/2022   No results found for: "HGBA1C" Lab Results  Component Value Date   CHOL 147 09/27/2021   HDL 48 09/27/2021   LDLCALC 78 09/27/2021   TRIG 131 09/27/2021   CHOLHDL 3.6 06/27/2015    Significant Diagnostic Results in last 30 days:  No results found.  Assessment/Plan Hypokalemia K 3.2 05/07/22, taking  Kcl  Anemia, chronic disease Hgb 11.4 05/07/22  SVT (supraventricular tachycardia) Heart rate is controlled,  takes Coreg  Lower obstructive uropathy  f/u Urology, off Uribel, on Oxybutynin.  Restless legs syndrome (RLS)  taking MiraPex at bedtime and prn.   Adult failure to thrive under Hospice service, weight stable.       Family/ staff Communication: plan of care reviewed with the patient and charge nurse.   Labs/tests ordered:  none  Time spend 30 minutes.

## 2023-02-13 NOTE — Assessment & Plan Note (Signed)
Heart rate is controlled,  takes Coreg

## 2023-02-13 NOTE — Assessment & Plan Note (Signed)
f/u Urology, off Uribel, on Oxybutynin.  

## 2023-02-14 NOTE — Assessment & Plan Note (Signed)
under Hospice service, weight stable.

## 2023-02-26 ENCOUNTER — Encounter: Payer: Self-pay | Admitting: Nurse Practitioner

## 2023-02-26 ENCOUNTER — Non-Acute Institutional Stay (SKILLED_NURSING_FACILITY): Payer: Self-pay | Admitting: Nurse Practitioner

## 2023-02-26 DIAGNOSIS — B372 Candidiasis of skin and nail: Secondary | ICD-10-CM

## 2023-02-26 DIAGNOSIS — I471 Supraventricular tachycardia, unspecified: Secondary | ICD-10-CM

## 2023-02-26 DIAGNOSIS — G2581 Restless legs syndrome: Secondary | ICD-10-CM

## 2023-02-26 DIAGNOSIS — Z9359 Other cystostomy status: Secondary | ICD-10-CM | POA: Diagnosis not present

## 2023-02-26 DIAGNOSIS — R609 Edema, unspecified: Secondary | ICD-10-CM

## 2023-02-26 DIAGNOSIS — E876 Hypokalemia: Secondary | ICD-10-CM

## 2023-02-26 DIAGNOSIS — D638 Anemia in other chronic diseases classified elsewhere: Secondary | ICD-10-CM

## 2023-02-26 DIAGNOSIS — F331 Major depressive disorder, recurrent, moderate: Secondary | ICD-10-CM

## 2023-02-26 NOTE — Progress Notes (Unsigned)
Location:   SNF [facility] Room Number: N029-A Place of Service:  SNF (31) Provider: Arna Snipe Dayven Linsley NP  Climmie Buelow X, NP  Patient Care Team: Shariya Gaster X, NP as PCP - General (Internal Medicine) Ernesto Rutherford, MD as Consulting Physician (Ophthalmology) Jake Bathe, MD as Consulting Physician (Cardiology) Venancio Poisson, MD as Consulting Physician (Dermatology) Ranee Gosselin, MD as Consulting Physician (Orthopedic Surgery) Eugenia Mcalpine, MD (Inactive) as Consulting Physician (Orthopedic Surgery) Bradly Bienenstock, MD as Consulting Physician (Orthopedic Surgery) [name] Marycarmen Hagey X, NP as Nurse Practitioner (Nurse Practitioner) York Spaniel, MD (Inactive) as Consulting Physician (Neurology)  Extended Emergency Contact Information Primary Emergency Contact: [name],[name] Address: [address]          [address]          [place], [place] [address] [place] of [place] Home Phone: [phone] Relation: Sister Secondary Emergency Contact: [place] Home Phone: [phone] Mobile Phone: [phone] Relation: Sister  Code Status: DNR Goals of care: Advanced Directive information    02/13/2023   10:15 AM  Advanced Directives  Does Patient Have a Medical Advance Directive? Yes  Type of Advance Directive Out of facility DNR (pink MOST or yellow form)  Does patient want to make changes to medical advance directive? No - Patient declined  Pre-existing out of facility DNR order (yellow form or pink MOST form) Yellow form placed in chart (order not valid for inpatient use);Pink MOST form placed in chart (order not valid for inpatient use)     Chief Complaint  Patient presents with  . Acute Visit    rash in groin    HPI:  Pt is a 89 y.o. female seen today for an acute visit for the R and L groin area redness, started Nystatin cream bid 02/21/23     02/12/22 Obstructive nephritis, underwent cystoscopy with right  retrograde pyelogram, right ureteral stent placed, f/u Urology Dr. Berneice Heinrich, 03/06/22 cystoscopy with retrograde pyelogram, ureteroscopy and stent replacement, SPC exchange.                05/03/22 abd [place] no renal or gallbladder stone or hydronephrosis. 05/02/22 Xray abd may represent ileus. Hx of Ureteral stone with hydronephrosis per CT abd                          05/13/22 Urology: R ureteral stone, sp R ureteroscopy stone free 03/2022, stent. SPC  urethral stricture, on Oxybutynin. Hx of Ureteral stone with hydronephrosis per CT abd             External hemorrhoids, placed on Colace             Hypokalemia, K 3.2 05/07/22, taking  Kcl             Anemia, Hgb 11.4 05/07/22             SVT, takes Coreg             BLE edema, not apparent, off Furosemide.              Adult failure to thrive, under Hospice service, weight stable.              Suprapubic Catheter, f/u Urology, off Uribel, on Oxybutynin.              Depression, stable, off Lorazepam             Dementia, under hospice service  Restless leg syndrome,  taking MiraPex at bedtime and prn.              Hx of seizures, no active seizures, off meds.              COPD, on ProAir, Albuterol neb.        Past Medical History:  Diagnosis Date  . Acute bronchitis 05/23/2011  . Acute upper respiratory infections of unspecified site 05/23/2011  . Arthritis   . Chronic airway obstruction, not elsewhere classified 05/23/2011  . Coronary artery disease   . Disturbance of salivary secretion 01/31/2011  . Dizziness and giddiness 01/31/2011  . Dyspnea   . Essential tremor 04/25/2014  . External hemorrhoids without mention of complication 01/31/2011  . Gait disorder 04/25/2014  . GERD (gastroesophageal reflux disease)   . History of kidney stones   . Insomnia, unspecified 09/12/2011  . Lumbago 01/31/2011  . Major depressive disorder, single episode, unspecified 01/31/2011  . Memory disorder 04/25/2014  . Mitral valve  disorders(424.0) 01/31/2011  . Other and unspecified hyperlipidemia 01/31/2011  . Other convulsions 01/31/2011  . Other emphysema (HCC) 01/31/2011  . Pain in joint, site unspecified 01/31/2011  . Restless legs syndrome (RLS) 09/12/2011  . Retinal detachment with retinal defect of right eye 2011   right eye twice  . Seizure disorder (HCC)   . Senile osteoporosis 01/31/2011  . Spontaneous ecchymoses 01/31/2011  . Squamous cell carcinoma of leg   . Stiffness of joints, not elsewhere classified, multiple sites 01/31/2011  . Unspecified essential hypertension 01/31/2011   Past Surgical History:  Procedure Laterality Date  . ABDOMINAL HYSTERECTOMY  06/21/2003   TAH/BSO, omenectomy PSB resect, Stg IC cystadenofibroma  . CHOLECYSTECTOMY  2005   Dr. Carolynne Edouard  . CYSTOSCOPY W/ URETERAL STENT PLACEMENT Right 02/13/2022   Procedure: CYSTOSCOPY WITH RETROGRADE PYELOGRAM/URETERAL STENT PLACEMENT, AND SUPRAPUBIC TUBE EXCHANGE;  Surgeon: Sebastian Ache, MD;  Location: [facility];  Service: Urology;  Laterality: Right;  . CYSTOSCOPY WITH RETROGRADE PYELOGRAM, URETEROSCOPY AND STENT PLACEMENT Right 03/06/2022   Procedure: CYSTOSCOPY WITH RETROGRADE PYELOGRAM, URETEROSCOPY AND STENT REPLACEMENT, SUPRAPUBIC CATHETER EXCHANGE;  Surgeon: Sebastian Ache, MD;  Location: [facility];  Service: Urology;  Laterality: Right;  . ELBOW SURGERY Right 2008   broken   Dr. Orlan Leavens  . EYE SURGERY    . HOLMIUM LASER APPLICATION Right 03/06/2022   Procedure: HOLMIUM LASER APPLICATION;  Surgeon: Sebastian Ache, MD;  Location: [facility];  Service: Urology;  Laterality: Right;  . INTRAMEDULLARY (IM) NAIL INTERTROCHANTERIC Left 08/30/2021   Procedure: INTRAMEDULLARY (IM) NAIL INTERTROCHANTRIC;  Surgeon: Samson Frederic, MD;  Location: [facility];  Service: Orthopedics;  Laterality: Left;  . ORIF PATELLA Left 05/02/2020   Procedure: OPEN REDUCTION INTERNAL (ORIF) FIXATION LEFT PATELLA WITH MEDIAL AND LATERAL LIGAMENT REINFORCEMENTS;  Surgeon:  Sheral Apley, MD;  Location: [facility];  Service: Orthopedics;  Laterality: Left;  . ORIF PATELLA Left 05/30/2020   Procedure: OPEN REDUCTION INTERNAL (ORIF) FIXATION PATELLA;  Surgeon: Sheral Apley, MD;  Location: [facility];  Service: Orthopedics;  Laterality: Left;  . RETINAL DETACHMENT SURGERY N/A    two  . REVERSE SHOULDER ARTHROPLASTY Left 05/06/2019   Procedure: REVERSE SHOULDER ARTHROPLASTY;  Surgeon: Francena Hanly, MD;  Location: [facility];  Service: Orthopedics;  Laterality: Left;   . ROTATOR CUFF REPAIR Right 2012   Dr. Thomasena Edis  . SQUAMOUS CELL CARCINOMA EXCISION Bilateral 2012, 8/14   [place] on legs   Dr. Irene Limbo  . TONSILLECTOMY  1941  .  VIDEO BRONCHOSCOPY WITH ENDOBRONCHIAL NAVIGATION N/A 11/29/2015   Procedure: VIDEO BRONCHOSCOPY WITH ENDOBRONCHIAL NAVIGATION;  Surgeon: Leslye Peer, MD;  Location: [facility];  Service: Thoracic;  Laterality: N/A;    Allergies  Allergen Reactions  . Vioxx [Rofecoxib] Shortness Of Breath  . Dyazide [Hydrochlorothiazide W-Triamterene] Other (See Comments)    Lowers blood pressure too much  . Latex Swelling and Other (See Comments)    "Allergic," per [facility]  . Sulfa Antibiotics Nausea And Vomiting and Other (See Comments)    "Allergic," per [facility]  . Sumycin [Tetracycline] Other (See Comments)    Can't take due to drug reaction- "Allergic," per [facility]    Allergies as of 02/26/2023       Reactions   Vioxx [rofecoxib] Shortness Of Breath   Dyazide [hydrochlorothiazide W-triamterene] Other (See Comments)   Lowers blood pressure too much   Latex Swelling, Other (See Comments)   "Allergic," per [facility]   Sulfa Antibiotics Nausea And Vomiting, Other (See Comments)   "Allergic," per [facility]   Sumycin [tetracycline] Other (See Comments)   Can't take due to drug reaction- "Allergic," per [facility]        Medication List        Accurate as of February 26, 2023 11:59 PM. If you have any questions, ask your nurse or doctor.          acetaminophen 325  MG tablet Commonly known as: TYLENOL Take 650 mg by mouth every 6 (six) hours as needed for fever or mild pain. Give 2 tablet by mouth every 6 hours as needed for Pain   acetic acid 0.25 % irrigation Irrigate with 1 Application as directed 2 (two) times daily.   carvedilol 3.125 MG tablet Commonly known as: COREG Take 1 tablet (3.125 mg total) by mouth 2 (two) times daily with a meal.   docusate sodium 100 MG capsule Commonly known as: COLACE Take 100 mg by mouth as needed for mild constipation.   ketoconazole 2 % shampoo Commonly known as: NIZORAL Apply 1 Application topically 2 (two) times a week. Monday and Thursday   mirtazapine 7.5 MG tablet Commonly known as: REMERON Take 7.5 mg by mouth daily.   Morphine Sulfate (Concentrate) 5 MG/0.25ML Soln Take 0.25 mLs by mouth every 8 (eight) hours as needed.   Nyamyc powder Generic drug: nystatin Apply 1 Application topically daily as needed.   nystatin cream Commonly known as: MYCOSTATIN Apply 1 Application topically 2 (two) times daily.   Orajel 2X Toothache & Gum 20-0.26 % Gel Generic drug: Benzocaine-Menthol Use as directed 1 Dose in the mouth or throat every 6 (six) hours as needed (Tooth Pain).   oxybutynin 5 MG tablet Commonly known as: DITROPAN Take 5 mg by mouth 2 (two) times daily.   potassium chloride 10 MEQ CR capsule Commonly known as: MICRO-K Take 10 mEq by mouth daily.   pramipexole 0.5 MG tablet Commonly known as: MIRAPEX Take 0.5 mg by mouth at bedtime.   pramipexole 0.25 MG tablet Commonly known as: MIRAPEX Take 0.25 mg by mouth daily. As needed for restless legs, see other bedtime listing   ProAir RespiClick 108 (90 Base) MCG/ACT Aepb Generic drug: Albuterol Sulfate Inhale 2 puffs into the lungs daily as needed (shortness of breath or wheezing).   albuterol 1.25 MG/3ML nebulizer solution Commonly known as: ACCUNEB Take 1 ampule by nebulization every 6 (six) hours as needed for shortness of  breath.        Review of Systems  Constitutional:  Negative  for appetite change, fatigue and fever.  HENT:  Positive for hearing loss. Negative for congestion and trouble swallowing.   Eyes:  Negative for visual disturbance.  Respiratory:  Negative for shortness of breath.        DOE is chronic  Cardiovascular:  Negative for leg swelling.  Gastrointestinal:  Negative for abdominal pain and constipation.       Hemorrhoids from previous examination.   Genitourinary:  Positive for difficulty urinating.       [facility]  Musculoskeletal:  Positive for arthralgias and gait problem.       S/p left hip ORIF, ORIF of the left patella. Positional pain in the left knee sometimes.   Skin:  Positive for rash.       BLE discoloration, chronic venous insufficiency skin changes. Left shin skin lesions.  The R+L groin area redness.    Neurological:  Negative for seizures, weakness and headaches.       Memory lapses. Hx of seizures. RLS  Psychiatric/Behavioral:  Negative for behavioral problems and sleep disturbance. The patient is not nervous/anxious.     Immunization History  Administered Date(s) Administered  . Influenza Split 01/06/2014, 01/15/2017, 01/08/2018, 12/09/2018  . Influenza Whole 01/07/2012, 01/06/2013  . Influenza, High Dose Seasonal PF 12/25/2015, 01/20/2017, 01/08/2023  . Influenza,inj,Quad PF,6+ Mos 12/21/2014  . Influenza-Unspecified 12/09/2018, 01/18/2020, 01/24/2021, 01/30/2022  . Moderna SARS-COV2 Booster Vaccination 04/02/2021  . Moderna Sars-Covid-2 Vaccination 04/12/2019, 06/05/2019, 02/15/2020, 09/05/2020  . PFIZER(Purple Top)SARS-COV-2 Vaccination 12/26/2020  . PPD Test 02/16/2022  . Pneumococcal Conjugate-13 02/01/2014  . Pneumococcal Polysaccharide-23 12/20/1992, 01/15/2000, 04/08/2004, 06/08/2004  . Rsv, Bivalent, Protein Subunit Rsvpref,pf Verdis Frederickson) 04/26/2022  . Td 04/08/2002, 04/22/2002  . Tdap 04/09/2011, 08/17/2015  . Zoster Recombinant(Shingrix) 05/29/2005,  08/27/2017, 06/13/2022  . Zoster, Live 04/08/2008, 05/29/2014, 05/28/2017  . Zoster, Unspecified 05/29/2005   Pertinent  Health Maintenance Due  Topic Date Due  . INFLUENZA VACCINE  Completed  . DEXA SCAN  Completed  . MAMMOGRAM  Discontinued      02/15/2022    3:00 PM 02/15/2022    9:00 PM 02/16/2022    8:00 AM 03/06/2022    7:25 AM 05/20/2022    3:42 PM  Fall Risk  Falls in the past year?     0  Was there an injury with Fall?     0  Fall Risk Category Calculator     0  (RETIRED) Patient Fall Risk Level Low fall risk Moderate fall risk Moderate fall risk High fall risk   Patient at Risk for Falls Due to     No Fall Risks  Fall risk Follow up     Falls evaluation completed   Functional Status Survey:    Vitals:   02/26/23 1143  BP: 127/88  Pulse: 78  Resp: 18  Temp: (!) 97.3 F (36.3 C)  SpO2: 95%  Weight: 158 lb 14.4 oz (72.1 kg)  Height: 5\' 3"  (1.6 m)   Body mass index is 28.15 kg/m. Physical Exam Vitals and nursing note reviewed.  Constitutional:      Appearance: Normal appearance.  HENT:     Head: Normocephalic and atraumatic.     Nose: Nose normal.     Mouth/Throat:     Mouth: Mucous membranes are moist.     Pharynx: No posterior oropharyngeal erythema.  Eyes:     Extraocular Movements: Extraocular movements intact.     Conjunctiva/sclera: Conjunctivae normal.     Right eye: Right conjunctiva is not injected.  Left eye: Left conjunctiva is not injected.     Pupils: Pupils are equal, round, and reactive to light.  Neck:     Comments: Left adam's apple bony aspect, sometimes feels soreness on palpation is chronic, no noted lymph nodes  Cardiovascular:     Rate and Rhythm: Normal rate and regular rhythm.     Heart sounds: Murmur heard.     Comments: PD pulses are not felt from previous examination.  Pulmonary:     Effort: Pulmonary effort is normal.     Breath sounds: No rales.     Comments: Decreased air entry to both lungs.  Abdominal:      General: Bowel sounds are normal.     Palpations: Abdomen is soft.     Tenderness: There is no abdominal tenderness.     Comments: Mid abd surgical scar. SPC  Genitourinary:    Comments: SPC. External hemorrhoids x2, no injury, bleeding, or pain  Musculoskeletal:     Cervical back: Normal range of motion and neck supple.     Right lower leg: No edema.     Left lower leg: No edema.     Comments: Decreased overhead ROM of the left shoulder. Left knee s/p ORIF of the patella fx.   Skin:    General: Skin is warm and dry.     Findings: Erythema, lesion and rash present.     Comments: BLE discoloration, chronic venous insufficiency skin changes. Left shin skin lesions-opted out workup or tx.  The R+L groin area redness, mild itching     Neurological:     General: No focal deficit present.     Mental Status: She is alert. Mental status is at baseline.     Gait: Gait abnormal.  Psychiatric:        Mood and Affect: Mood normal.        Behavior: Behavior normal.    Labs reviewed: Recent Labs    05/07/22 0000  NA 141  K 3.2*  CL 104  CO2 29*  BUN 15  CREATININE 0.6  CALCIUM 8.8   Recent Labs    05/07/22 0000  AST 12*  ALT 6*  ALKPHOS 100  ALBUMIN 3.3*   Recent Labs    05/07/22 0000  WBC 5.0  NEUTROABS 2,815.00  HGB 11.4*  HCT 36  PLT 279   Lab Results  Component Value Date   TSH 1.198 02/15/2022   No results found for: "HGBA1C" Lab Results  Component Value Date   CHOL 147 09/27/2021   HDL 48 09/27/2021   LDLCALC 78 09/27/2021   TRIG 131 09/27/2021   CHOLHDL 3.6 06/27/2015    Significant Diagnostic Results in last 30 days:  No results found.  Assessment/Plan: Skin candidiasis  the R and L groin area redness, started Nystatin cream bid 02/21/23, c/o mild itching, will continue until healed  Suprapubic catheter (HCC) SPC  urethral stricture, on Oxybutynin. Hx of Ureteral stone with hydronephrosis per CT abd  Hypokalemia K 3.2 05/07/22, taking   Kcl  Anemia, chronic disease  Hgb 11.4 05/07/22  SVT (supraventricular tachycardia) Heart rate is in control,  takes Coreg  Edema BLE edema, not apparent, off Furosemide.   Major depressive disorder, recurrent (HCC) Depression, stable, off Lorazepam             Dementia, under hospice service  Restless legs syndrome (RLS) Managed,  taking MiraPex at bedtime and prn.     Family/ staff Communication: plan of care reviewed with  the patient and charge nurse   Labs/tests ordered: none  Time spend 30 minutes.

## 2023-02-26 NOTE — Assessment & Plan Note (Signed)
the R and L groin area redness, started Nystatin cream bid 02/21/23, c/o mild itching, will continue until healed

## 2023-02-26 NOTE — Assessment & Plan Note (Signed)
Depression, stable, off Lorazepam             Dementia, under hospice service

## 2023-02-26 NOTE — Assessment & Plan Note (Signed)
 K 3.2 05/07/22, taking  Kcl

## 2023-02-26 NOTE — Assessment & Plan Note (Signed)
Managed,  taking MiraPex at bedtime and prn.

## 2023-02-26 NOTE — Assessment & Plan Note (Signed)
BLE edema, not apparent, off Furosemide.

## 2023-02-26 NOTE — Assessment & Plan Note (Signed)
Heart rate is in control,  takes Coreg 

## 2023-02-26 NOTE — Assessment & Plan Note (Signed)
Hgb 11.4 05/07/22

## 2023-02-26 NOTE — Assessment & Plan Note (Signed)
SPC  urethral stricture, on Oxybutynin. Hx of Ureteral stone with hydronephrosis per CT abd

## 2023-02-27 ENCOUNTER — Encounter: Payer: Self-pay | Admitting: Nurse Practitioner

## 2023-03-13 ENCOUNTER — Non-Acute Institutional Stay (SKILLED_NURSING_FACILITY): Admitting: Sports Medicine

## 2023-03-13 ENCOUNTER — Encounter: Payer: Self-pay | Admitting: Sports Medicine

## 2023-03-13 DIAGNOSIS — N139 Obstructive and reflux uropathy, unspecified: Secondary | ICD-10-CM

## 2023-03-13 DIAGNOSIS — J439 Emphysema, unspecified: Secondary | ICD-10-CM

## 2023-03-13 DIAGNOSIS — I471 Supraventricular tachycardia, unspecified: Secondary | ICD-10-CM | POA: Diagnosis not present

## 2023-03-13 DIAGNOSIS — Z515 Encounter for palliative care: Secondary | ICD-10-CM | POA: Diagnosis not present

## 2023-03-13 DIAGNOSIS — G2581 Restless legs syndrome: Secondary | ICD-10-CM

## 2023-03-13 DIAGNOSIS — F331 Major depressive disorder, recurrent, moderate: Secondary | ICD-10-CM

## 2023-03-13 DIAGNOSIS — F0390 Unspecified dementia without behavioral disturbance: Secondary | ICD-10-CM

## 2023-03-13 DIAGNOSIS — B372 Candidiasis of skin and nail: Secondary | ICD-10-CM

## 2023-03-13 NOTE — Progress Notes (Signed)
Provider:  Venita Sheffield MD Location:   [name]   Place of Service:   Skilled care   PCP: Mast, Man X, NP Patient Care Team: Mast, Man X, NP as PCP - General (Internal Medicine) Ernesto Rutherford, MD as Consulting Physician (Ophthalmology) Jake Bathe, MD as Consulting Physician (Cardiology) Venancio Poisson, MD as Consulting Physician (Dermatology) Ranee Gosselin, MD as Consulting Physician (Orthopedic Surgery) Eugenia Mcalpine, MD (Inactive) as Consulting Physician (Orthopedic Surgery) Bradly Bienenstock, MD as Consulting Physician (Orthopedic Surgery) [name] Mast, Man X, NP as Nurse Practitioner (Nurse Practitioner) York Spaniel, MD (Inactive) as Consulting Physician (Neurology)  Extended Emergency Contact Information Primary Emergency Contact: [name],[name] Address: [address]          [id number]          [place], [place] [address] [place] Home Phone: [phone] Relation: Sister Secondary Emergency Contact: [place] Home Phone: [phone] Mobile Phone: [phone] Relation: Sister  Code Status:  Goals of Care: Advanced Directive information    02/13/2023   10:15 AM  Advanced Directives  Does Patient Have a Medical Advance Directive? Yes  Type of Advance Directive Out of facility DNR (pink MOST or yellow form)  Does patient want to make changes to medical advance directive? No - Patient declined  Pre-existing out of facility DNR order (yellow form or pink MOST form) Yellow form placed in chart (order not valid for inpatient use);Pink MOST form placed in chart (order not valid for inpatient use)      No chief complaint on file.                                   H  HPI: Patient is a 89 y.o. female seen today for routine visit for chronic disease management Pt seen in the dining hall, she ate her breakfast, could not remember what she had for breakfast. She likes to fold napkins in  the dining room.  Pt moves around using power scooter. She is currently doing physical therapy. Pt reports that she enjoys doing therapy.   Suprapubic cathter - in place Denies pain around the catheter, Denies abdominal pain, nausea, vomiting   Candidiasis - rash improved as per nursing staff  Pt denies pain or discomfort    SVT -  BP running high  Currently on coreg Denies chest pain, palpitations   Dementia  on hospice care-  Chronic pain - pt c/o pain in her legs  Currently on morphine  Restless leg syndrome - on pramipraxole    Hypokalemia- on potassium supplements   Past Medical History:  Diagnosis Date   Acute bronchitis 05/23/2011   Acute upper respiratory infections of unspecified site 05/23/2011   Arthritis    Chronic airway obstruction, not elsewhere classified 05/23/2011   Coronary artery disease    Disturbance of salivary secretion 01/31/2011   Dizziness and giddiness 01/31/2011   Dyspnea    Essential tremor 04/25/2014   External hemorrhoids without mention of complication 01/31/2011   Gait disorder 04/25/2014   GERD (gastroesophageal reflux disease)    History of kidney stones    Insomnia, unspecified 09/12/2011   Lumbago 01/31/2011   Major depressive disorder, single episode, unspecified 01/31/2011   Memory disorder 04/25/2014   Mitral valve disorders(424.0) 01/31/2011   Other and unspecified hyperlipidemia 01/31/2011   Other convulsions 01/31/2011   Other emphysema (HCC) 01/31/2011  Pain in joint, site unspecified 01/31/2011   Restless legs syndrome (RLS) 09/12/2011   Retinal detachment with retinal defect of right eye 2011   right eye twice   Seizure disorder (HCC)    Senile osteoporosis 01/31/2011   Spontaneous ecchymoses 01/31/2011   Squamous cell carcinoma of leg    Stiffness of joints, not elsewhere classified, multiple sites 01/31/2011   Unspecified essential hypertension 01/31/2011   Past Surgical History:  Procedure Laterality Date    ABDOMINAL HYSTERECTOMY  06/21/2003   TAH/BSO, omenectomy PSB resect, Stg IC cystadenofibroma   CHOLECYSTECTOMY  2005   Dr. Carolynne Edouard   CYSTOSCOPY W/ URETERAL STENT PLACEMENT Right 02/13/2022   Procedure: CYSTOSCOPY WITH RETROGRADE PYELOGRAM/URETERAL STENT PLACEMENT, AND SUPRAPUBIC TUBE EXCHANGE;  Surgeon: Sebastian Ache, MD;  Location: [facility];  Service: Urology;  Laterality: Right;   CYSTOSCOPY WITH RETROGRADE PYELOGRAM, URETEROSCOPY AND STENT PLACEMENT Right 03/06/2022   Procedure: CYSTOSCOPY WITH RETROGRADE PYELOGRAM, URETEROSCOPY AND STENT REPLACEMENT, SUPRAPUBIC CATHETER EXCHANGE;  Surgeon: Sebastian Ache, MD;  Location: [facility];  Service: Urology;  Laterality: Right;   ELBOW SURGERY Right 2008   broken   Dr. Orlan Leavens   EYE SURGERY     HOLMIUM LASER APPLICATION Right 03/06/2022   Procedure: HOLMIUM LASER APPLICATION;  Surgeon: Sebastian Ache, MD;  Location: [facility];  Service: Urology;  Laterality: Right;   INTRAMEDULLARY (IM) NAIL INTERTROCHANTERIC Left 08/30/2021   Procedure: INTRAMEDULLARY (IM) NAIL INTERTROCHANTRIC;  Surgeon: Samson Frederic, MD;  Location: [facility];  Service: Orthopedics;  Laterality: Left;   ORIF PATELLA Left 05/02/2020   Procedure: OPEN REDUCTION INTERNAL (ORIF) FIXATION LEFT PATELLA WITH MEDIAL AND LATERAL LIGAMENT REINFORCEMENTS;  Surgeon: Sheral Apley, MD;  Location: [facility];  Service: Orthopedics;  Laterality: Left;   ORIF PATELLA Left 05/30/2020   Procedure: OPEN REDUCTION INTERNAL (ORIF) FIXATION PATELLA;  Surgeon: Sheral Apley, MD;  Location: [facility];  Service: Orthopedics;  Laterality: Left;   RETINAL DETACHMENT SURGERY N/A    two   REVERSE SHOULDER ARTHROPLASTY Left 05/06/2019   Procedure: REVERSE SHOULDER ARTHROPLASTY;  Surgeon: Francena Hanly, MD;  Location: [facility];  Service: Orthopedics;  Laterality: Left;    ROTATOR CUFF REPAIR Right 2012   Dr. Thomasena Edis   SQUAMOUS CELL CARCINOMA EXCISION Bilateral 2012, 8/14   [place] on legs   Dr. Irene Limbo    TONSILLECTOMY  1941   VIDEO BRONCHOSCOPY WITH ENDOBRONCHIAL NAVIGATION N/A 11/29/2015   Procedure: VIDEO BRONCHOSCOPY WITH ENDOBRONCHIAL NAVIGATION;  Surgeon: Leslye Peer, MD;  Location: [facility];  Service: Thoracic;  Laterality: N/A;    reports that she quit smoking about 34 years ago. Her smoking use included cigarettes. She started smoking about 74 years ago. She has a 40 pack-year smoking history. She has never used smokeless tobacco. She reports that she does not drink alcohol and does not use drugs. Social History   Socioeconomic History   Marital status: Divorced    Spouse name: Not on file   Number of children: 0   Years of education: Not on file   Highest education level: Not on file  Occupational History   Occupation: retired Programmer, systems for special students  Tobacco Use   Smoking status: Former    Current packs/day: 0.00    Average packs/day: 1 pack/day for 40.0 years (40.0 ttl pk-yrs)    Types: Cigarettes    Start date: 04/08/1948    Quit date: 04/08/1988    Years since quitting: 34.9   Smokeless tobacco: Never   Tobacco comments:  quit in 1990  Vaping Use   Vaping status: Never Used  Substance and Sexual Activity   Alcohol use: No    Alcohol/week: 0.0 standard drinks of alcohol   Drug use: No   Sexual activity: Never  Other Topics Concern   Not on file  Social History Narrative   Lives at [facility]   No children   Divorced   Exercise climbs steps 4 flights daily PT for balance   Alcohol none   Stopped smoking 1989   Patient drinks 3 large sodas daily.   Patient is right handed.   Social Determinants of Health   Financial Resource Strain: Not on file  Food Insecurity: No Food Insecurity (02/12/2022)   Hunger Vital Sign    Worried About Running Out of Food in the Last Year: Never true    Ran Out of Food in the Last Year: Never true  Transportation Needs: No Transportation Needs (02/12/2022)   PRAPARE - Administrator, Civil Service  (Medical): No    Lack of Transportation (Non-Medical): No  Physical Activity: Not on file  Stress: Not on file  Social Connections: Not on file  Intimate Partner Violence: Not At Risk (02/12/2022)   Humiliation, Afraid, Rape, and Kick questionnaire    Fear of Current or Ex-Partner: No    Emotionally Abused: No    Physically Abused: No    Sexually Abused: No    Functional Status Survey:    Family History  Problem Relation Age of Onset   Heart disease Father        CHF   Cancer Mother        breast   Seizures Sister     Health Maintenance  Topic Date Due   DTaP/Tdap/Td (5 - Td or Tdap) 08/16/2025   Pneumonia Vaccine 51+ Years old  Completed   INFLUENZA VACCINE  Completed   DEXA SCAN  Completed   Zoster Vaccines- Shingrix  Completed   HPV VACCINES  Aged Out   MAMMOGRAM  Discontinued   COVID-19 Vaccine  Discontinued    Allergies  Allergen Reactions   Vioxx [Rofecoxib] Shortness Of Breath   Dyazide [Hydrochlorothiazide W-Triamterene] Other (See Comments)    Lowers blood pressure too much   Latex Swelling and Other (See Comments)    "Allergic," per [facility]   Sulfa Antibiotics Nausea And Vomiting and Other (See Comments)    "Allergic," per [facility]   Sumycin [Tetracycline] Other (See Comments)    Can't take due to drug reaction- "Allergic," per [facility]    Outpatient Encounter Medications as of 03/13/2023  Medication Sig   acetaminophen (TYLENOL) 325 MG tablet Take 650 mg by mouth every 6 (six) hours as needed for fever or mild pain. Give 2 tablet by mouth every 6 hours as needed for Pain   acetic acid 0.25 % irrigation Irrigate with 1 Application as directed 2 (two) times daily.   albuterol (ACCUNEB) 1.25 MG/3ML nebulizer solution Take 1 ampule by nebulization every 6 (six) hours as needed for shortness of breath.   Albuterol Sulfate (PROAIR RESPICLICK) 108 (90 Base) MCG/ACT AEPB Inhale 2 puffs into the lungs daily as needed (shortness of breath or wheezing).   Benzocaine-Menthol  (ORAJEL 2X TOOTHACHE & GUM) 20-0.26 % GEL Use as directed 1 Dose in the mouth or throat every 6 (six) hours as needed (Tooth Pain).   carvedilol (COREG) 3.125 MG tablet Take 1 tablet (3.125 mg total) by mouth 2 (two) times daily with a meal.  docusate sodium (COLACE) 100 MG capsule Take 100 mg by mouth as needed for mild constipation.   ketoconazole (NIZORAL) 2 % shampoo Apply 1 Application topically 2 (two) times a week. Monday and Thursday   mirtazapine (REMERON) 7.5 MG tablet Take 7.5 mg by mouth daily.   Morphine Sulfate, Concentrate, 5 MG/0.25ML SOLN Take 0.25 mLs by mouth every 8 (eight) hours as needed.   NYAMYC powder Apply 1 Application topically daily as needed.   nystatin cream (MYCOSTATIN) Apply 1 Application topically 2 (two) times daily.   oxybutynin (DITROPAN) 5 MG tablet Take 5 mg by mouth 2 (two) times daily.   potassium chloride (MICRO-K) 10 MEQ CR capsule Take 10 mEq by mouth daily.   pramipexole (MIRAPEX) 0.25 MG tablet Take 0.25 mg by mouth daily. As needed for restless legs, see other bedtime listing   pramipexole (MIRAPEX) 0.5 MG tablet Take 0.5 mg by mouth at bedtime.   No facility-administered encounter medications on file as of 03/13/2023.    Review of Systems  There were no vitals filed for this visit. There is no height or weight on file to calculate BMI. Physical Exam Constitutional:      Appearance: Normal appearance.  HENT:     Head: Normocephalic and atraumatic.  Cardiovascular:     Rate and Rhythm: Normal rate and regular rhythm.  Pulmonary:     Effort: Pulmonary effort is normal. No respiratory distress.     Breath sounds: Normal breath sounds. No wheezing.  Abdominal:     General: Bowel sounds are normal. There is no distension.     Tenderness: There is no abdominal tenderness. There is no guarding or rebound.     Comments:  No erythema noted around supra pubic cathter No tenderness on palpation  Musculoskeletal:        General: No swelling or  tenderness.  Skin:    General: Skin is dry.     Comments: Dry skin noted on both her legs   Neurological:     Mental Status: She is alert. Mental status is at baseline.     Sensory: No sensory deficit.     Motor: No weakness.     Labs reviewed: Basic Metabolic Panel: Recent Labs    05/07/22 0000  NA 141  K 3.2*  CL 104  CO2 29*  BUN 15  CREATININE 0.6  CALCIUM 8.8   Liver Function Tests: Recent Labs    05/07/22 0000  AST 12*  ALT 6*  ALKPHOS 100  ALBUMIN 3.3*   No results for input(s): "LIPASE", "AMYLASE" in the last 8760 hours. No results for input(s): "AMMONIA" in the last 8760 hours. CBC: Recent Labs    05/07/22 0000  WBC 5.0  NEUTROABS 2,815.00  HGB 11.4*  HCT 36  PLT 279   Cardiac Enzymes: No results for input(s): "CKTOTAL", "CKMB", "CKMBINDEX", "TROPONINI" in the last 8760 hours. BNP: Invalid input(s): "POCBNP" No results found for: "HGBA1C" Lab Results  Component Value Date   TSH 1.198 02/15/2022   Lab Results  Component Value Date   VITAMINB12 249 09/05/2021   Lab Results  Component Value Date   FOLATE 23.1 09/05/2021   Lab Results  Component Value Date   IRON 64 09/05/2021   TIBC 369 09/05/2021   FERRITIN 31 09/05/2021    Imaging and Procedures obtained prior to SNF admission: DG C-Arm 1-60 Min-No Report  Result Date: 03/06/2022 Fluoroscopy was utilized by the requesting physician.  No radiographic interpretation.   DG C-Arm 1-60  Min-No Report  Result Date: 03/06/2022 Fluoroscopy was utilized by the requesting physician.  No radiographic interpretation.    Assessment/Plan  1. Hospice care patient Pt seems comfortable  C/o chronic pain  Cont with morphine  2. SVT (supraventricular tachycardia) (HCC) Denies palpitations, chest pain  Cont with coreg  3. Pulmonary emphysema, unspecified emphysema type (HCC) No wheezing on aus Able to speak in full sentences Does not appear to be in distress Cont with albuterol  prn  4. Dementia without behavioral disturbance (HCC) Cont with supportive care   5. Skin candidiasis Improving Cont with nystatin  6. Moderate episode of recurrent major depressive disorder (HCC) Stable Cont with rameron   7. Restless legs syndrome (RLS) Cont with pramiprexole  8. Lower obstructive uropathy S/p SPC  Cont with catheter care    Family/ staff Communication:  care plan discussed with the nursing staff   30 Total time spent for obtaining history,  performing a medically appropriate examination and evaluation, reviewing the tests,ordering  tests,  documenting clinical information in the electronic or other health record, independently interpreting results ,care coordination (not separately reported)

## 2023-03-31 ENCOUNTER — Non-Acute Institutional Stay (SKILLED_NURSING_FACILITY): Payer: Self-pay | Admitting: Sports Medicine

## 2023-03-31 ENCOUNTER — Encounter: Payer: Self-pay | Admitting: Sports Medicine

## 2023-03-31 DIAGNOSIS — R413 Other amnesia: Secondary | ICD-10-CM | POA: Diagnosis not present

## 2023-03-31 DIAGNOSIS — F321 Major depressive disorder, single episode, moderate: Secondary | ICD-10-CM | POA: Diagnosis not present

## 2023-03-31 DIAGNOSIS — K5900 Constipation, unspecified: Secondary | ICD-10-CM | POA: Diagnosis not present

## 2023-03-31 DIAGNOSIS — R5383 Other fatigue: Secondary | ICD-10-CM

## 2023-03-31 NOTE — Progress Notes (Signed)
Provider: Venita Sheffield MD Location:   [name]    Place of Service:  Skilled care    PCP: Mast, Man X, NP Patient Care Team: Mast, Man X, NP as PCP - General (Internal Medicine) Ernesto Rutherford, MD as Consulting Physician (Ophthalmology) Jake Bathe, MD as Consulting Physician (Cardiology) Venancio Poisson, MD as Consulting Physician (Dermatology) Ranee Gosselin, MD as Consulting Physician (Orthopedic Surgery) Eugenia Mcalpine, MD (Inactive) as Consulting Physician (Orthopedic Surgery) Bradly Bienenstock, MD as Consulting Physician (Orthopedic Surgery) [name] Mast, Man X, NP as Nurse Practitioner (Nurse Practitioner) York Spaniel, MD (Inactive) as Consulting Physician (Neurology)  Extended Emergency Contact Information Primary Emergency Contact: [name],[name] Address: [address]          [address]          [place], [place] [address] [place] Home Phone: [phone] Relation: Sister Secondary Emergency Contact: Rocky Link [place] Home Phone: [phone] Mobile Phone: [phone] Relation: Sister  Code Status:  Goals of Care: Advanced Directive information    02/13/2023   10:15 AM  Advanced Directives  Does Patient Have a Medical Advance Directive? Yes  Type of Advance Directive Out of facility DNR (pink MOST or yellow form)  Does patient want to make changes to medical advance directive? No - Patient declined  Pre-existing out of facility DNR order (yellow form or pink MOST form) Yellow form placed in chart (order not valid for inpatient use);Pink MOST form placed in chart (order not valid for inpatient use)                                    NON HOSPICE RELATED VISIT    No chief complaint on file.   HPI: Patient is a 89 y.o. female seen today for acute visit for fatigue Pt seen and examined in her room, she is a poor historian . States she is not feeling good but cannot describe more.  Feels  tired and depressed. Reports poor appetite  She did not eat much of her breakfast. She is able to speak in full sentences, does not appear to be in distress. Pt reports that being forgetful. As per staff report she endorsed that she is sleeping in the middle of conversation and fatigue   Pt denies dysuria, chest pain, sob, abdominal pain, nausea, vomiting during the visit with me today  Her last bowel movement 2 days ago    Past Medical History:  Diagnosis Date   Acute bronchitis 05/23/2011   Acute upper respiratory infections of unspecified site 05/23/2011   Arthritis    Chronic airway obstruction, not elsewhere classified 05/23/2011   Coronary artery disease    Disturbance of salivary secretion 01/31/2011   Dizziness and giddiness 01/31/2011   Dyspnea    Essential tremor 04/25/2014   External hemorrhoids without mention of complication 01/31/2011   Gait disorder 04/25/2014   GERD (gastroesophageal reflux disease)    History of kidney stones    Insomnia, unspecified 09/12/2011   Lumbago 01/31/2011   Major depressive disorder, single episode, unspecified 01/31/2011   Memory disorder 04/25/2014   Mitral valve disorders(424.0) 01/31/2011   Other and unspecified hyperlipidemia 01/31/2011   Other convulsions 01/31/2011   Other emphysema (HCC) 01/31/2011   Pain in joint, site unspecified 01/31/2011   Restless legs syndrome (RLS) 09/12/2011   Retinal detachment with retinal defect of right eye 2011  right eye twice   Seizure disorder (HCC)    Senile osteoporosis 01/31/2011   Spontaneous ecchymoses 01/31/2011   Squamous cell carcinoma of leg    Stiffness of joints, not elsewhere classified, multiple sites 01/31/2011   Unspecified essential hypertension 01/31/2011   Past Surgical History:  Procedure Laterality Date   ABDOMINAL HYSTERECTOMY  06/21/2003   TAH/BSO, omenectomy PSB resect, Stg IC cystadenofibroma   CHOLECYSTECTOMY  2005   Dr. Carolynne Edouard   CYSTOSCOPY W/ URETERAL STENT  PLACEMENT Right 02/13/2022   Procedure: CYSTOSCOPY WITH RETROGRADE PYELOGRAM/URETERAL STENT PLACEMENT, AND SUPRAPUBIC TUBE EXCHANGE;  Surgeon: Sebastian Ache, MD;  Location: [facility];  Service: Urology;  Laterality: Right;   CYSTOSCOPY WITH RETROGRADE PYELOGRAM, URETEROSCOPY AND STENT PLACEMENT Right 03/06/2022   Procedure: CYSTOSCOPY WITH RETROGRADE PYELOGRAM, URETEROSCOPY AND STENT REPLACEMENT, SUPRAPUBIC CATHETER EXCHANGE;  Surgeon: Sebastian Ache, MD;  Location: [facility];  Service: Urology;  Laterality: Right;   ELBOW SURGERY Right 2008   broken   Dr. Orlan Leavens   EYE SURGERY     HOLMIUM LASER APPLICATION Right 03/06/2022   Procedure: HOLMIUM LASER APPLICATION;  Surgeon: Sebastian Ache, MD;  Location: [facility];  Service: Urology;  Laterality: Right;   INTRAMEDULLARY (IM) NAIL INTERTROCHANTERIC Left 08/30/2021   Procedure: INTRAMEDULLARY (IM) NAIL INTERTROCHANTRIC;  Surgeon: Samson Frederic, MD;  Location: [facility];  Service: Orthopedics;  Laterality: Left;   ORIF PATELLA Left 05/02/2020   Procedure: OPEN REDUCTION INTERNAL (ORIF) FIXATION LEFT PATELLA WITH MEDIAL AND LATERAL LIGAMENT REINFORCEMENTS;  Surgeon: Sheral Apley, MD;  Location: [facility];  Service: Orthopedics;  Laterality: Left;   ORIF PATELLA Left 05/30/2020   Procedure: OPEN REDUCTION INTERNAL (ORIF) FIXATION PATELLA;  Surgeon: Sheral Apley, MD;  Location: [facility];  Service: Orthopedics;  Laterality: Left;   RETINAL DETACHMENT SURGERY N/A    two   REVERSE SHOULDER ARTHROPLASTY Left 05/06/2019   Procedure: REVERSE SHOULDER ARTHROPLASTY;  Surgeon: Francena Hanly, MD;  Location: [facility];  Service: Orthopedics;  Laterality: Left;    ROTATOR CUFF REPAIR Right 2012   Dr. Thomasena Edis   SQUAMOUS CELL CARCINOMA EXCISION Bilateral 2012, 8/14   [place] on legs   Dr. Irene Limbo   TONSILLECTOMY  1941   VIDEO BRONCHOSCOPY WITH ENDOBRONCHIAL NAVIGATION N/A 11/29/2015   Procedure: VIDEO BRONCHOSCOPY WITH ENDOBRONCHIAL NAVIGATION;  Surgeon: Leslye Peer, MD;  Location: [facility];  Service: Thoracic;  Laterality: N/A;    reports that she quit smoking about 35 years ago. Her smoking use included cigarettes. She started smoking about 75 years ago. She has a 40 pack-year smoking history. She has never used smokeless tobacco. She reports that she does not drink alcohol and does not use drugs. Social History   Socioeconomic History   Marital status: Divorced    Spouse name: Not on file   Number of children: 0   Years of education: Not on file   Highest education level: Not on file  Occupational History   Occupation: retired Programmer, systems for special students  Tobacco Use   Smoking status: Former    Current packs/day: 0.00    Average packs/day: 1 pack/day for 40.0 years (40.0 ttl pk-yrs)    Types: Cigarettes    Start date: 04/08/1948    Quit date: 04/08/1988    Years since quitting: 35.0   Smokeless tobacco: Never   Tobacco comments:    quit in 1990  Vaping Use   Vaping status: Never Used  Substance and Sexual Activity   Alcohol use: No  Alcohol/week: 0.0 standard drinks of alcohol   Drug use: No   Sexual activity: Never  Other Topics Concern   Not on file  Social History Narrative   [facility] at [facility]   No children   Divorced   Exercise climbs steps 4 flights daily PT for balance   Alcohol none   Stopped smoking 1989   Patient drinks 3 large sodas daily.   Patient is right handed.   Social Drivers of Corporate investment banker Strain: Not on file  Food Insecurity: No Food Insecurity (02/12/2022)   Hunger Vital Sign    Worried About Running Out of Food in the Last Year: Never true    Ran Out of Food in the Last Year: Never true  Transportation Needs: No Transportation Needs (02/12/2022)   PRAPARE - Administrator, Civil Service (Medical): No    Lack of Transportation (Non-Medical): No  Physical Activity: Not on file  Stress: Not on file  Social Connections: Not on file  Intimate Partner Violence:  Not At Risk (02/12/2022)   Humiliation, Afraid, Rape, and Kick questionnaire    Fear of Current or Ex-Partner: No    Emotionally Abused: No    Physically Abused: No    Sexually Abused: No    Functional Status Survey:    Family History  Problem Relation Age of Onset   Heart disease Father        CHF   Cancer Mother        breast   Seizures Sister     Health Maintenance  Topic Date Due   DTaP/Tdap/Td (5 - Td or Tdap) 08/16/2025   Pneumonia Vaccine 44+ Years old  Completed   INFLUENZA VACCINE  Completed   DEXA SCAN  Completed   Zoster Vaccines- Shingrix  Completed   HPV VACCINES  Aged Out   MAMMOGRAM  Discontinued   COVID-19 Vaccine  Discontinued    Allergies  Allergen Reactions   Vioxx [Rofecoxib] Shortness Of Breath   Dyazide [Hydrochlorothiazide W-Triamterene] Other (See Comments)    Lowers blood pressure too much   Latex Swelling and Other (See Comments)    "Allergic," per [facility]   Sulfa Antibiotics Nausea And Vomiting and Other (See Comments)    "Allergic," per [facility]   Sumycin [Tetracycline] Other (See Comments)    Can't take due to drug reaction- "Allergic," per [facility]    Outpatient Encounter Medications as of 03/31/2023  Medication Sig   acetaminophen (TYLENOL) 325 MG tablet Take 650 mg by mouth every 6 (six) hours as needed for fever or mild pain. Give 2 tablet by mouth every 6 hours as needed for Pain   acetic acid 0.25 % irrigation Irrigate with 1 Application as directed 2 (two) times daily.   albuterol (ACCUNEB) 1.25 MG/3ML nebulizer solution Take 1 ampule by nebulization every 6 (six) hours as needed for shortness of breath.   Albuterol Sulfate (PROAIR RESPICLICK) 108 (90 Base) MCG/ACT AEPB Inhale 2 puffs into the lungs daily as needed (shortness of breath or wheezing).   Benzocaine-Menthol (ORAJEL 2X TOOTHACHE & GUM) 20-0.26 % GEL Use as directed 1 Dose in the mouth or throat every 6 (six) hours as needed (Tooth Pain).   carvedilol (COREG) 3.125 MG tablet Take 1  tablet (3.125 mg total) by mouth 2 (two) times daily with a meal.   docusate sodium (COLACE) 100 MG capsule Take 100 mg by mouth as needed for mild constipation.   ketoconazole (NIZORAL) 2 % shampoo  Apply 1 Application topically 2 (two) times a week. Monday and Thursday   mirtazapine (REMERON) 7.5 MG tablet Take 7.5 mg by mouth daily.   Morphine Sulfate, Concentrate, 5 MG/0.25ML SOLN Take 0.25 mLs by mouth every 8 (eight) hours as needed.   NYAMYC powder Apply 1 Application topically daily as needed.   nystatin cream (MYCOSTATIN) Apply 1 Application topically 2 (two) times daily.   oxybutynin (DITROPAN) 5 MG tablet Take 5 mg by mouth 2 (two) times daily.   potassium chloride (MICRO-K) 10 MEQ CR capsule Take 10 mEq by mouth daily.   pramipexole (MIRAPEX) 0.25 MG tablet Take 0.25 mg by mouth daily. As needed for restless legs, see other bedtime listing   pramipexole (MIRAPEX) 0.5 MG tablet Take 0.5 mg by mouth at bedtime.   No facility-administered encounter medications on file as of 03/31/2023.    Review of Systems  Constitutional:  Negative for fever.  HENT:  Negative for sore throat.   Respiratory:  Negative for cough, shortness of breath and wheezing.   Cardiovascular:  Negative for chest pain, palpitations and leg swelling.  Gastrointestinal:  Positive for constipation. Negative for abdominal distention, abdominal pain, blood in stool, diarrhea, nausea and vomiting.  Genitourinary:  Negative for dysuria, frequency and urgency.  Neurological:  Negative for dizziness, weakness and numbness.  Psychiatric/Behavioral:  Positive for confusion and dysphoric mood. Negative for suicidal ideas.     There were no vitals filed for this visit. There is no height or weight on file to calculate BMI. Physical Exam Constitutional:      Appearance: Normal appearance.  HENT:     Head: Normocephalic and atraumatic.  Cardiovascular:     Rate and Rhythm: Normal rate and regular rhythm.  Pulmonary:      Effort: Pulmonary effort is normal. No respiratory distress.     Breath sounds: Normal breath sounds. No wheezing.  Abdominal:     General: Bowel sounds are normal. There is no distension.     Tenderness: There is no abdominal tenderness. There is no guarding or rebound.     Comments:    Musculoskeletal:        General: No swelling or tenderness.  Neurological:     Mental Status: She is alert. Mental status is at baseline.     Motor: No weakness.     Labs reviewed: Basic Metabolic Panel: Recent Labs    05/07/22 0000  NA 141  K 3.2*  CL 104  CO2 29*  BUN 15  CREATININE 0.6  CALCIUM 8.8   Liver Function Tests: Recent Labs    05/07/22 0000  AST 12*  ALT 6*  ALKPHOS 100  ALBUMIN 3.3*   No results for input(s): "LIPASE", "AMYLASE" in the last 8760 hours. No results for input(s): "AMMONIA" in the last 8760 hours. CBC: Recent Labs    05/07/22 0000  WBC 5.0  NEUTROABS 2,815.00  HGB 11.4*  HCT 36  PLT 279   Cardiac Enzymes: No results for input(s): "CKTOTAL", "CKMB", "CKMBINDEX", "TROPONINI" in the last 8760 hours. BNP: Invalid input(s): "POCBNP" No results found for: "HGBA1C" Lab Results  Component Value Date   TSH 1.198 02/15/2022   Lab Results  Component Value Date   VITAMINB12 249 09/05/2021   Lab Results  Component Value Date   FOLATE 23.1 09/05/2021   Lab Results  Component Value Date   IRON 64 09/05/2021   TIBC 369 09/05/2021   FERRITIN 31 09/05/2021    Imaging and Procedures obtained prior to  SNF admission: DG C-Arm 1-60 Min-No Report Result Date: 03/06/2022 Fluoroscopy was utilized by the requesting physician.  No radiographic interpretation.   DG C-Arm 1-60 Min-No Report Result Date: 03/06/2022 Fluoroscopy was utilized by the requesting physician.  No radiographic interpretation.    Assessment/Plan  1. Other fatigue (Primary) Pt reports feeling tired  Will get labs cbc, bmp  2. Current moderate episode of major depressive  disorder without prior episode (HCC) Endorses being depressed Staff noticed that pt is not active and noticed change from her baseline Will increase rameron to 15 mg daily  3. Constipation, unspecified constipation type Instructed staff to give her miralax  today Cont with colace prn  4. Memory loss Pt reports confusion and memory lapse Will d/c oxybutynin due to anticholinergic side effects Will get labs cbc, bmp      Family/ staff Communication:  care plan discussed with the staff   Labs/tests ordered: cbc, bmp  30 min Total time spent for obtaining history,  performing a medically appropriate examination and evaluation, reviewing the tests,ordering  tests,  documenting clinical information in the electronic or other health record, independently interpreting results ,care coordination (not separately reported)

## 2023-04-01 LAB — BASIC METABOLIC PANEL WITH GFR
BUN: 37 — AB (ref 4–21)
CO2: 30 — AB (ref 13–22)
Chloride: 107 (ref 99–108)
Creatinine: 0.9 (ref 0.5–1.1)
Glucose: 65
Potassium: 3.9 meq/L (ref 3.5–5.1)
Sodium: 144 (ref 137–147)

## 2023-04-01 LAB — CBC AND DIFFERENTIAL
HCT: 40 (ref 36–46)
Hemoglobin: 12.5 (ref 12.0–16.0)
Neutrophils Absolute: 3175
Platelets: 167 10*3/uL (ref 150–400)
WBC: 5.4

## 2023-04-01 LAB — COMPREHENSIVE METABOLIC PANEL WITH GFR
Albumin: 3.8 (ref 3.5–5.0)
Calcium: 9.5 (ref 8.7–10.7)
Globulin: 2.9

## 2023-04-01 LAB — HEPATIC FUNCTION PANEL
ALT: 16 U/L (ref 7–35)
AST: 21 (ref 13–35)
Alkaline Phosphatase: 124 (ref 25–125)
Bilirubin, Total: 0.4

## 2023-04-01 LAB — CBC: RBC: 5.21 — AB (ref 3.87–5.11)

## 2023-04-21 ENCOUNTER — Non-Acute Institutional Stay (SKILLED_NURSING_FACILITY): Payer: Self-pay | Admitting: Nurse Practitioner

## 2023-04-21 ENCOUNTER — Encounter: Payer: Self-pay | Admitting: Nurse Practitioner

## 2023-04-21 DIAGNOSIS — J439 Emphysema, unspecified: Secondary | ICD-10-CM | POA: Diagnosis not present

## 2023-04-21 DIAGNOSIS — G2581 Restless legs syndrome: Secondary | ICD-10-CM

## 2023-04-21 DIAGNOSIS — R4689 Other symptoms and signs involving appearance and behavior: Secondary | ICD-10-CM

## 2023-04-21 DIAGNOSIS — Z9359 Other cystostomy status: Secondary | ICD-10-CM | POA: Diagnosis not present

## 2023-04-21 DIAGNOSIS — R4189 Other symptoms and signs involving cognitive functions and awareness: Secondary | ICD-10-CM

## 2023-04-21 NOTE — Assessment & Plan Note (Signed)
Suprapubic Catheter, f/u Urology, off Uribel, on Oxybutynin.

## 2023-04-21 NOTE — Assessment & Plan Note (Signed)
 Chronic SOB on exertion, on ProAir, Albuterol neb.

## 2023-04-21 NOTE — Assessment & Plan Note (Signed)
 No behavioral issues, under hospice service

## 2023-04-21 NOTE — Assessment & Plan Note (Signed)
 Stable, taking MiraPex at bedtime and prn.

## 2023-04-21 NOTE — Progress Notes (Signed)
 Location:   S[facility] Room Number: 69 Place of Service:  SNF (31) Provider: Larwance Andric Kerce NP  Maryjayne Kleven X, NP  Patient Care Team: [name] X, NP as PCP - General (Internal Medicine) Martha Charleston, MD as Consulting Physician (Ophthalmology) Martha Oneil BROCKS, MD as Consulting Physician (Cardiology) Martha Doffing, MD as Consulting Physician (Dermatology) Martha Ingle, MD as Consulting Physician (Orthopedic Surgery) Martha Charleston, MD (Inactive) as Consulting Physician (Orthopedic Surgery) Martha Easter, MD as Consulting Physician (Orthopedic Surgery) [name], [facility] [name] X, NP as Nurse Practitioner (Nurse Practitioner) Martha Carlin POUR, MD (Inactive) as Consulting Physician (Neurology)  Extended Emergency Contact Information Primary Emergency Contact: [name],[name] Address: [address]          [address]          [place], [place] [address] [place] Home Phone: [phone] Relation: Sister Secondary Emergency Contact: [name],[name]  [place] Home Phone: [phone] Mobile Phone: [phone] Relation: Sister  Code Status:  DNR Goals of care: Advanced Directive information    02/13/2023   10:15 AM  Advanced Directives  Does Patient Have a Medical Advance Directive? Yes  Type of Advance Directive Out of facility DNR (pink MOST or yellow form)  Does patient want to make changes to medical advance directive? No - Patient declined  Pre-existing out of facility DNR order (yellow form or pink MOST form) Yellow form placed in chart (order not valid for inpatient use);Pink MOST form placed in chart (order not valid for inpatient use)     Chief Complaint  Patient presents with   Medical Management of Chronic Issues    HPI:  Pt is a 89 y.o. female seen today for medical management of chronic diseases.     The patient, with a history of COPD and restless leg syndrome, presents after a near fall in the bathroom. She had  resisted to ED eval, despite being advised to do so. She attempted to block her fall with the bathroom rail, resulting in a lip injury. The lip injury is no longer painful and appears to be healing well.  She reports sleeping well on an air mattress and has found relief from restless leg syndrome with medication. Her appetite is good and she denies constipation, nausea, vomiting, and diarrhea. However, she had an incident of loose stool associated with stomach cramps, which she initially thought was the flu. This appears to have been a one-time occurrence.  She continues to experience shortness of breath, which she attributes to her COPD. 02/12/22 Obstructive nephritis, underwent cystoscopy with right retrograde pyelogram, right ureteral stent placed, f/u Urology Dr. Alvaro, 03/06/22 cystoscopy with retrograde pyelogram, ureteroscopy and stent replacement, SPC exchange.                05/03/22 abd US  no renal or gallbladder stone or hydronephrosis. 05/02/22 Xray abd may represent ileus. Hx of Ureteral stone with hydronephrosis per CT abd                          05/13/22 Urology: R ureteral stone, sp R ureteroscopy stone free 03/2022, stent. SPC  urethral stricture, on Oxybutynin . Hx of Ureteral stone with hydronephrosis per CT abd             External hemorrhoids, placed on Colace             Hypokalemia, K 3.2 05/07/22, taking  Kcl  Anemia, Hgb 11.4 05/07/22             SVT, takes Coreg              BLE edema, not apparent, off Furosemide .              Adult failure to thrive, under Hospice service, weight stable.              Suprapubic Catheter, f/u Urology, off Uribel, on Oxybutynin .              Depression, stable, off Lorazepam              Dementia, under hospice service             Restless leg syndrome,  taking MiraPex  at bedtime and prn.              Hx of seizures, no active seizures, off meds.              COPD, on ProAir , Albuterol  neb.       Past Medical History:  Diagnosis  Date   Acute bronchitis 05/23/2011   Acute upper respiratory infections of unspecified site 05/23/2011   Arthritis    Chronic airway obstruction, not elsewhere classified 05/23/2011   Coronary artery disease    Disturbance of salivary secretion 01/31/2011   Dizziness and giddiness 01/31/2011   Dyspnea    Essential tremor 04/25/2014   External hemorrhoids without mention of complication 01/31/2011   Gait disorder 04/25/2014   GERD (gastroesophageal reflux disease)    History of kidney stones    Insomnia, unspecified 09/12/2011   Lumbago 01/31/2011   Major depressive disorder, single episode, unspecified 01/31/2011   Memory disorder 04/25/2014   Mitral valve disorders(424.0) 01/31/2011   Other and unspecified hyperlipidemia 01/31/2011   Other convulsions 01/31/2011   Other emphysema (HCC) 01/31/2011   Pain in joint, site unspecified 01/31/2011   Restless legs syndrome (RLS) 09/12/2011   Retinal detachment with retinal defect of right eye 2011   right eye twice   Seizure disorder (HCC)    Senile osteoporosis 01/31/2011   Spontaneous ecchymoses 01/31/2011   Squamous cell carcinoma of leg    Stiffness of joints, not elsewhere classified, multiple sites 01/31/2011   Unspecified essential hypertension 01/31/2011   Past Surgical History:  Procedure Laterality Date   ABDOMINAL HYSTERECTOMY  06/21/2003   TAH/BSO, omenectomy PSB resect, Stg IC cystadenofibroma   CHOLECYSTECTOMY  2005   Dr. [name]   CYSTOSCOPY W/ URETERAL STENT PLACEMENT Right 02/13/2022   Procedure: CYSTOSCOPY WITH RETROGRADE PYELOGRAM/URETERAL STENT PLACEMENT, AND SUPRAPUBIC TUBE EXCHANGE;  Surgeon: [name], MD;  Location: [facility];  Service: Urology;  Laterality: Right;   CYSTOSCOPY WITH RETROGRADE PYELOGRAM, URETEROSCOPY AND STENT PLACEMENT Right 03/06/2022   Procedure: CYSTOSCOPY WITH RETROGRADE PYELOGRAM, URETEROSCOPY AND STENT REPLACEMENT, SUPRAPUBIC CATHETER EXCHANGE;  Surgeon: [name], MD;  Location:  [facility];  Service: Urology;  Laterality: Right;   ELBOW SURGERY Right 2008   broken   Dr. [name]   EYE SURGERY     HOLMIUM LASER APPLICATION Right 03/06/2022   Procedure: HOLMIUM LASER APPLICATION;  Surgeon: [name], MD;  Location: [facility];  Service: Urology;  Laterality: Right;   INTRAMEDULLARY (IM) NAIL INTERTROCHANTERIC Left 08/30/2021   Procedure: INTRAMEDULLARY (IM) NAIL INTERTROCHANTRIC;  Surgeon: [name], MD;  Location: [facility];  Service: Orthopedics;  Laterality: Left;   ORIF PATELLA Left 05/02/2020   Procedure: OPEN REDUCTION INTERNAL (ORIF) FIXATION LEFT PATELLA WITH MEDIAL AND LATERAL LIGAMENT  REINFORCEMENTS;  Surgeon: [name], MD;  Location: [facility];  Service: Orthopedics;  Laterality: Left;   ORIF PATELLA Left 05/30/2020   Procedure: OPEN REDUCTION INTERNAL (ORIF) FIXATION PATELLA;  Surgeon: [name], MD;  Location: [facility];  Service: Orthopedics;  Laterality: Left;   RETINAL DETACHMENT SURGERY N/A    two   REVERSE SHOULDER ARTHROPLASTY Left 05/06/2019   Procedure: REVERSE SHOULDER ARTHROPLASTY;  Surgeon: Melita Drivers, MD;  Location: [facility];  Service: Orthopedics;  Laterality: Left;    ROTATOR CUFF REPAIR Right 2012   Dr. Gerome   SQUAMOUS CELL CARCINOMA EXCISION Bilateral 2012, 8/14   [place] on legs   Dr. Jadine   TONSILLECTOMY  1941   VIDEO BRONCHOSCOPY WITH ENDOBRONCHIAL NAVIGATION N/A 11/29/2015   Procedure: VIDEO BRONCHOSCOPY WITH ENDOBRONCHIAL NAVIGATION;  Surgeon: Lamar GORMAN Chris, MD;  Location: [facility];  Service: Thoracic;  Laterality: N/A;    Allergies  Allergen Reactions   Vioxx [Rofecoxib] Shortness Of Breath   Dyazide [Hydrochlorothiazide W-Triamterene] Other (See Comments)    Lowers blood pressure too much   Latex Swelling and Other (See Comments)    Allergic, per [facility]   Sulfa Antibiotics Nausea And Vomiting and Other (See Comments)    Allergic, per [facility]   Sumycin [Tetracycline] Other (See Comments)    Can't take due to  drug reaction- Allergic, per [facility]    Allergies as of 04/21/2023       Reactions   Vioxx [rofecoxib] Shortness Of Breath   Dyazide [hydrochlorothiazide W-triamterene] Other (See Comments)   Lowers blood pressure too much   Latex Swelling, Other (See Comments)   Allergic, per [facility]   Sulfa Antibiotics Nausea And Vomiting, Other (See Comments)   Allergic, per [facility]   Sumycin [tetracycline] Other (See Comments)   Can't take due to drug reaction- Allergic, per [facility]        Medication List        Accurate as of April 21, 2023  3:34 PM. If you have any questions, ask your nurse or doctor.          acetaminophen  325 MG tablet Commonly known as: TYLENOL  Take 650 mg by mouth every 6 (six) hours as needed for fever or mild pain. Give 2 tablet by mouth every 6 hours as needed for Pain   acetic acid 0.25 % irrigation Irrigate with 1 Application as directed 2 (two) times daily.   carvedilol  3.125 MG tablet Commonly known as: COREG  Take 1 tablet (3.125 mg total) by mouth 2 (two) times daily with a meal.   docusate sodium  100 MG capsule Commonly known as: COLACE Take 100 mg by mouth as needed for mild constipation.   ketoconazole 2 % shampoo Commonly known as: NIZORAL Apply 1 Application topically 2 (two) times a week. Monday and Thursday   mirtazapine  7.5 MG tablet Commonly known as: REMERON  Take 7.5 mg by mouth daily.   Morphine  Sulfate (Concentrate) 5 MG/0.25ML Soln Take 0.25 mLs by mouth every 8 (eight) hours as needed.   Nyamyc powder Generic drug: nystatin Apply 1 Application topically daily as needed.   nystatin cream Commonly known as: MYCOSTATIN Apply 1 Application topically 2 (two) times daily.   Orajel 2X Toothache & Gum 20-0.26 % Gel Generic drug: Benzocaine-Menthol  Use as directed 1 Dose in the mouth or throat every 6 (six) hours as needed (Tooth Pain).   oxybutynin  5 MG tablet Commonly known as: DITROPAN  Take 5 mg by mouth 2 (two) times daily.  potassium chloride  10 MEQ CR capsule Commonly known as: MICRO-K  Take 10 mEq by mouth daily.   pramipexole  0.5 MG tablet Commonly known as: MIRAPEX  Take 0.5 mg by mouth at bedtime.   pramipexole  0.25 MG tablet Commonly known as: MIRAPEX  Take 0.25 mg by mouth daily. As needed for restless legs, see other bedtime listing   ProAir  RespiClick 108 (90 Base) MCG/ACT Aepb Generic drug: Albuterol  Sulfate Inhale 2 puffs into the lungs daily as needed (shortness of breath or wheezing).   albuterol  1.25 MG/3ML nebulizer solution Commonly known as: ACCUNEB  Take 1 ampule by nebulization every 6 (six) hours as needed for shortness of breath.        Review of Systems  Constitutional:  Negative for appetite change, fatigue and fever.  HENT:  Positive for hearing loss. Negative for congestion and trouble swallowing.   Eyes:  Negative for visual disturbance.  Respiratory:  Negative for shortness of breath.        DOE is chronic  Cardiovascular:  Positive for leg swelling.  Gastrointestinal:  Negative for abdominal pain and constipation.       Hemorrhoids from previous examination.   Genitourinary:  Positive for difficulty urinating.       [facility]  Musculoskeletal:  Positive for arthralgias and gait problem.       S/p left hip ORIF, ORIF of the left patella. Positional pain in the left knee sometimes.   Skin:  Positive for wound.       BLE discoloration, chronic venous insufficiency skin changes.  Lower lip scabbed over from injury.    Neurological:  Negative for seizures, weakness and headaches.       Memory lapses. Hx of seizures. RLS  Psychiatric/Behavioral:  Negative for behavioral problems and sleep disturbance. The patient is not nervous/anxious.     Immunization History  Administered Date(s) Administered   Influenza Split 01/06/2014, 01/15/2017, 01/08/2018, 12/09/2018   Influenza Whole 01/07/2012, 01/06/2013   Influenza, High Dose Seasonal PF 12/25/2015, 01/20/2017, 01/08/2023    Influenza,inj,Quad PF,6+ Mos 12/21/2014   Influenza-Unspecified 12/09/2018, 01/18/2020, 01/24/2021, 01/30/2022   Moderna SARS-COV2 Booster Vaccination 04/02/2021   Moderna Sars-Covid-2 Vaccination 04/12/2019, 06/05/2019, 02/15/2020, 09/05/2020   PFIZER(Purple Top)SARS-COV-2 Vaccination 12/26/2020   PPD Test 02/16/2022   Pneumococcal Conjugate-13 02/01/2014   Pneumococcal Polysaccharide-23 12/20/1992, 01/15/2000, 04/08/2004, 06/08/2004   Rsv, Bivalent, Protein Subunit Rsvpref,pf [place]) 04/26/2022   Td 04/08/2002, 04/22/2002   Tdap 04/09/2011, 08/17/2015   Zoster Recombinant(Shingrix) 05/29/2005, 08/27/2017, 06/13/2022   Zoster, Live 04/08/2008, 05/29/2014, 05/28/2017   Zoster, Unspecified 05/29/2005   Pertinent  Health Maintenance Due  Topic Date Due   INFLUENZA VACCINE  Completed   DEXA SCAN  Completed   MAMMOGRAM  Discontinued      02/15/2022    3:00 PM 02/15/2022    9:00 PM 02/16/2022    8:00 AM 03/06/2022    7:25 AM 05/20/2022    3:42 PM  Fall Risk  Falls in the past year?     0  Was there an injury with Fall?     0  Fall Risk Category Calculator     0  (RETIRED) Patient Fall Risk Level Low fall risk Moderate fall risk Moderate fall risk High fall risk   Patient at Risk for Falls Due to     No Fall Risks  Fall risk Follow up     Falls evaluation completed   Functional Status Survey:    Vitals:   04/21/23 1414 04/21/23 1415  BP: (!) 158/97 130/72  Pulse:  90   Resp: 16   Temp: (!) 97.1 F (36.2 C)   SpO2: 90%    There is no height or weight on file to calculate BMI. Physical Exam Vitals and nursing note reviewed.  Constitutional:      Appearance: Normal appearance.  HENT:     Head: Normocephalic and atraumatic.     Nose: Nose normal.     Mouth/Throat:     Mouth: Mucous membranes are moist.     Pharynx: No posterior oropharyngeal erythema.  Eyes:     Extraocular Movements: Extraocular movements intact.     Conjunctiva/sclera: Conjunctivae normal.      Right eye: Right conjunctiva is not injected.     Left eye: Left conjunctiva is not injected.     Pupils: Pupils are equal, round, and reactive to light.  Neck:     Comments: Left adam's apple bony aspect, sometimes feels soreness on palpation is chronic, no noted lymph nodes  Cardiovascular:     Rate and Rhythm: Normal rate and regular rhythm.     Heart sounds: Murmur heard.     Comments: PD pulses are not felt from previous examination.  Pulmonary:     Effort: Pulmonary effort is normal.     Breath sounds: No rales.     Comments: Decreased air entry to both lungs.  Abdominal:     General: Bowel sounds are normal.     Palpations: Abdomen is soft.     Tenderness: There is no abdominal tenderness.     Comments: Mid abd surgical scar. SPC  Genitourinary:    Comments: SPC. External hemorrhoids x2, no injury, bleeding, or pain  Musculoskeletal:     Cervical back: Normal range of motion and neck supple.     Right lower leg: No edema.     Left lower leg: No edema.     Comments: Decreased overhead ROM of the left shoulder. Left knee s/p ORIF of the patella fx.   Skin:    General: Skin is warm and dry.     Findings: Lesion present.     Comments: BLE discoloration, chronic venous insufficiency skin changes. Left shin skin lesions-opted out workup or tx.  Lower lip scabbed over from injury     Neurological:     General: No focal deficit present.     Mental Status: She is alert. Mental status is at baseline.     Gait: Gait abnormal.  Psychiatric:        Mood and Affect: Mood normal.        Behavior: Behavior normal.     Labs reviewed: Recent Labs    05/07/22 0000  NA 141  K 3.2*  CL 104  CO2 29*  BUN 15  CREATININE 0.6  CALCIUM 8.8   Recent Labs    05/07/22 0000  AST 12*  ALT 6*  ALKPHOS 100  ALBUMIN  3.3*   Recent Labs    05/07/22 0000  WBC 5.0  NEUTROABS 2,815.00  HGB 11.4*  HCT 36  PLT 279   Lab Results  Component Value Date   TSH 1.198 02/15/2022    No results found for: HGBA1C Lab Results  Component Value Date   CHOL 147 09/27/2021   HDL 48 09/27/2021   LDLCALC 78 09/27/2021   TRIG 131 09/27/2021   CHOLHDL 3.6 06/27/2015    Significant Diagnostic Results in last 30 days:  No results found.  Assessment/Plan  Cognitive and behavioral changes No behavioral issues, under hospice service  Restless legs syndrome (RLS) Stable, taking MiraPex  at bedtime and prn.   COPD (chronic obstructive pulmonary disease) (HCC) Chronic SOB on exertion, on ProAir , Albuterol  neb.   Suprapubic catheter [facility])  Suprapubic Catheter, f/u Urology, off Uribel, on Oxybutynin .    Family/ staff Communication: plan of care reviewed with the patient and charge nurse.   Labs/tests ordered:  none  Time spend 30 minutes.

## 2023-05-13 DIAGNOSIS — J449 Chronic obstructive pulmonary disease, unspecified: Secondary | ICD-10-CM | POA: Diagnosis not present

## 2023-05-14 DIAGNOSIS — J449 Chronic obstructive pulmonary disease, unspecified: Secondary | ICD-10-CM | POA: Diagnosis not present

## 2023-05-15 DIAGNOSIS — J449 Chronic obstructive pulmonary disease, unspecified: Secondary | ICD-10-CM | POA: Diagnosis not present

## 2023-05-19 DIAGNOSIS — J449 Chronic obstructive pulmonary disease, unspecified: Secondary | ICD-10-CM | POA: Diagnosis not present

## 2023-05-20 ENCOUNTER — Non-Acute Institutional Stay (SKILLED_NURSING_FACILITY): Admitting: Nurse Practitioner

## 2023-05-20 ENCOUNTER — Encounter: Payer: Self-pay | Admitting: Nurse Practitioner

## 2023-05-20 DIAGNOSIS — D638 Anemia in other chronic diseases classified elsewhere: Secondary | ICD-10-CM

## 2023-05-20 DIAGNOSIS — R627 Adult failure to thrive: Secondary | ICD-10-CM

## 2023-05-20 DIAGNOSIS — E876 Hypokalemia: Secondary | ICD-10-CM

## 2023-05-20 DIAGNOSIS — J449 Chronic obstructive pulmonary disease, unspecified: Secondary | ICD-10-CM | POA: Diagnosis not present

## 2023-05-20 DIAGNOSIS — F323 Major depressive disorder, single episode, severe with psychotic features: Secondary | ICD-10-CM

## 2023-05-20 DIAGNOSIS — I471 Supraventricular tachycardia, unspecified: Secondary | ICD-10-CM

## 2023-05-20 DIAGNOSIS — Z9359 Other cystostomy status: Secondary | ICD-10-CM | POA: Diagnosis not present

## 2023-05-20 DIAGNOSIS — J439 Emphysema, unspecified: Secondary | ICD-10-CM

## 2023-05-20 DIAGNOSIS — G40909 Epilepsy, unspecified, not intractable, without status epilepticus: Secondary | ICD-10-CM

## 2023-05-20 DIAGNOSIS — F0390 Unspecified dementia without behavioral disturbance: Secondary | ICD-10-CM

## 2023-05-20 DIAGNOSIS — G2581 Restless legs syndrome: Secondary | ICD-10-CM

## 2023-05-20 NOTE — Progress Notes (Unsigned)
Location:   SNF [facility] Room Number: 16 Place of Service:  SNF (31) Provider: Arna Snipe Dazja Houchin NP  Memori Sammon X, NP  Patient Care Team: Christepher Melchior X, NP as PCP - General (Internal Medicine) Ernesto Rutherford, MD as Consulting Physician (Ophthalmology) Jake Bathe, MD as Consulting Physician (Cardiology) Venancio Poisson, MD as Consulting Physician (Dermatology) Ranee Gosselin, MD as Consulting Physician (Orthopedic Surgery) Eugenia Mcalpine, MD (Inactive) as Consulting Physician (Orthopedic Surgery) Bradly Bienenstock, MD as Consulting Physician (Orthopedic Surgery) [name] Maevyn Riordan X, NP as Nurse Practitioner (Nurse Practitioner) York Spaniel, MD (Inactive) as Consulting Physician (Neurology)  Extended Emergency Contact Information Primary Emergency Contact: [name],[name] Address: [address]          [address]          [place], [place] [address] [place] of [place] Home Phone: [phone] Relation: Sister Secondary Emergency Contact: Rocky [place] Home Phone: [phone] Mobile Phone: [phone] Relation: Sister  Code Status:  DNR Goals of care: Advanced Directive information    02/13/2023   10:15 AM  Advanced Directives  Does Patient Have a Medical Advance Directive? Yes  Type of Advance Directive Out of facility DNR (pink MOST or yellow form)  Does patient want to make changes to medical advance directive? No - Patient declined  Pre-existing out of facility DNR order (yellow form or pink MOST form) Yellow form placed in chart (order not valid for inpatient use);Pink MOST form placed in chart (order not valid for inpatient use)     Chief Complaint  Patient presents with  . Medical Management of Chronic Issues    HPI:  Pt is a 89 y.o. female seen today for medical management of chronic diseases.    02/12/22 Obstructive nephritis, underwent cystoscopy with right retrograde pyelogram, right ureteral stent placed, f/u  Urology Dr. Berneice Heinrich, 03/06/22 cystoscopy with retrograde pyelogram, ureteroscopy and stent replacement, SPC exchange.                05/03/22 abd [place] no renal or gallbladder stone or hydronephrosis. 05/02/22 Xray abd may represent ileus. Hx of Ureteral stone with hydronephrosis per CT abd                          05/13/22 Urology: R ureteral stone, sp R ureteroscopy stone free 03/2022, stent. SPC  urethral stricture, on Oxybutynin. Hx of Ureteral stone with hydronephrosis per CT abd             External hemorrhoids, placed on Colace             Hypokalemia, K 3.2 05/07/22, taking  Kcl             Anemia, Hgb 11.4 05/07/22             SVT, takes Coreg             BLE edema, not apparent, off Furosemide.              Adult failure to thrive, under Hospice service, weight stable.              Suprapubic Catheter, f/u Urology, off Uribel, on Oxybutynin.              Depression, stable, on Mirtazapine, off Lorazepam             Dementia, under hospice service, prn Morphine available to her.  Restless leg syndrome,  taking MiraPex at bedtime and prn.              Hx of seizures, no active seizures, off meds.              COPD, on ProAir, Albuterol neb.     Past Medical History:  Diagnosis Date  . Acute bronchitis 05/23/2011  . Acute upper respiratory infections of unspecified site 05/23/2011  . Arthritis   . Chronic airway obstruction, not elsewhere classified 05/23/2011  . Coronary artery disease   . Disturbance of salivary secretion 01/31/2011  . Dizziness and giddiness 01/31/2011  . Dyspnea   . Essential tremor 04/25/2014  . External hemorrhoids without mention of complication 01/31/2011  . Gait disorder 04/25/2014  . GERD (gastroesophageal reflux disease)   . History of kidney stones   . Insomnia, unspecified 09/12/2011  . Lumbago 01/31/2011  . Major depressive disorder, single episode, unspecified 01/31/2011  . Memory disorder 04/25/2014  . Mitral valve disorders(424.0)  01/31/2011  . Other and unspecified hyperlipidemia 01/31/2011  . Other convulsions 01/31/2011  . Other emphysema (HCC) 01/31/2011  . Pain in joint, site unspecified 01/31/2011  . Restless legs syndrome (RLS) 09/12/2011  . Retinal detachment with retinal defect of right eye 2011   right eye twice  . Seizure disorder (HCC)   . Senile osteoporosis 01/31/2011  . Spontaneous ecchymoses 01/31/2011  . Squamous cell carcinoma of leg   . Stiffness of joints, not elsewhere classified, multiple sites 01/31/2011  . Unspecified essential hypertension 01/31/2011   Past Surgical History:  Procedure Laterality Date  . ABDOMINAL HYSTERECTOMY  06/21/2003   TAH/BSO, omenectomy PSB resect, Stg IC cystadenofibroma  . CHOLECYSTECTOMY  2005   Dr. Carolynne Edouard  . CYSTOSCOPY W/ URETERAL STENT PLACEMENT Right 02/13/2022   Procedure: CYSTOSCOPY WITH RETROGRADE PYELOGRAM/URETERAL STENT PLACEMENT, AND SUPRAPUBIC TUBE EXCHANGE;  Surgeon: Sebastian Ache, MD;  Location: [facility];  Service: Urology;  Laterality: Right;  . CYSTOSCOPY WITH RETROGRADE PYELOGRAM, URETEROSCOPY AND STENT PLACEMENT Right 03/06/2022   Procedure: CYSTOSCOPY WITH RETROGRADE PYELOGRAM, URETEROSCOPY AND STENT REPLACEMENT, SUPRAPUBIC CATHETER EXCHANGE;  Surgeon: Sebastian Ache, MD;  Location: [facility];  Service: Urology;  Laterality: Right;  . ELBOW SURGERY Right 2008   broken   Dr. Orlan Leavens  . EYE SURGERY    . HOLMIUM LASER APPLICATION Right 03/06/2022   Procedure: HOLMIUM LASER APPLICATION;  Surgeon: Sebastian Ache, MD;  Location: [facility];  Service: Urology;  Laterality: Right;  . INTRAMEDULLARY (IM) NAIL INTERTROCHANTERIC Left 08/30/2021   Procedure: INTRAMEDULLARY (IM) NAIL INTERTROCHANTRIC;  Surgeon: Samson Frederic, MD;  Location: [facility];  Service: Orthopedics;  Laterality: Left;  . ORIF PATELLA Left 05/02/2020   Procedure: OPEN REDUCTION INTERNAL (ORIF) FIXATION LEFT PATELLA WITH MEDIAL AND LATERAL LIGAMENT REINFORCEMENTS;  Surgeon: Sheral Apley,  MD;  Location: [facility];  Service: Orthopedics;  Laterality: Left;  . ORIF PATELLA Left 05/30/2020   Procedure: OPEN REDUCTION INTERNAL (ORIF) FIXATION PATELLA;  Surgeon: Sheral Apley, MD;  Location: [facility];  Service: Orthopedics;  Laterality: Left;  . RETINAL DETACHMENT SURGERY N/A    two  . REVERSE SHOULDER ARTHROPLASTY Left 05/06/2019   Procedure: REVERSE SHOULDER ARTHROPLASTY;  Surgeon: Francena Hanly, MD;  Location: [facility];  Service: Orthopedics;  Laterality: Left;   . ROTATOR CUFF REPAIR Right 2012   Dr. Thomasena Edis  . SQUAMOUS CELL CARCINOMA EXCISION Bilateral 2012, 8/14   [place] on legs   Dr. Irene Limbo  . TONSILLECTOMY  1941  . VIDEO BRONCHOSCOPY WITH  ENDOBRONCHIAL NAVIGATION N/A 11/29/2015   Procedure: VIDEO BRONCHOSCOPY WITH ENDOBRONCHIAL NAVIGATION;  Surgeon: Leslye Peer, MD;  Location: [facility];  Service: Thoracic;  Laterality: N/A;    Allergies  Allergen Reactions  . Vioxx [Rofecoxib] Shortness Of Breath  . Dyazide [Hydrochlorothiazide W-Triamterene] Other (See Comments)    Lowers blood pressure too much  . Latex Swelling and Other (See Comments)    "Allergic," per [facility]  . Sulfa Antibiotics Nausea And Vomiting and Other (See Comments)    "Allergic," per [facility]  . Sumycin [Tetracycline] Other (See Comments)    Can't take due to drug reaction- "Allergic," per [facility]    Allergies as of 05/20/2023       Reactions   Vioxx [rofecoxib] Shortness Of Breath   Dyazide [hydrochlorothiazide W-triamterene] Other (See Comments)   Lowers blood pressure too much   Latex Swelling, Other (See Comments)   "Allergic," per [facility]   Sulfa Antibiotics Nausea And Vomiting, Other (See Comments)   "Allergic," per [facility]   Sumycin [tetracycline] Other (See Comments)   Can't take due to drug reaction- "Allergic," per [facility]        Medication List        Accurate as of May 20, 2023  3:31 PM. If you have any questions, ask your nurse or doctor.          acetaminophen 325 MG tablet Commonly  known as: TYLENOL Take 650 mg by mouth every 6 (six) hours as needed for fever or mild pain. Give 2 tablet by mouth every 6 hours as needed for Pain   acetic acid 0.25 % irrigation Irrigate with 1 Application as directed 2 (two) times daily.   carvedilol 3.125 MG tablet Commonly known as: COREG Take 1 tablet (3.125 mg total) by mouth 2 (two) times daily with a meal.   docusate sodium 100 MG capsule Commonly known as: COLACE Take 100 mg by mouth as needed for mild constipation.   ketoconazole 2 % shampoo Commonly known as: NIZORAL Apply 1 Application topically 2 (two) times a week. Monday and Thursday   mirtazapine 7.5 MG tablet Commonly known as: REMERON Take 7.5 mg by mouth daily.   Morphine Sulfate (Concentrate) 5 MG/0.25ML Soln Take 0.25 mLs by mouth every 8 (eight) hours as needed.   Nyamyc powder Generic drug: nystatin Apply 1 Application topically daily as needed.   nystatin cream Commonly known as: MYCOSTATIN Apply 1 Application topically 2 (two) times daily.   Orajel 2X Toothache & Gum 20-0.26 % Gel Generic drug: Benzocaine-Menthol Use as directed 1 Dose in the mouth or throat every 6 (six) hours as needed (Tooth Pain).   oxybutynin 5 MG tablet Commonly known as: DITROPAN Take 5 mg by mouth 2 (two) times daily.   potassium chloride 10 MEQ CR capsule Commonly known as: MICRO-K Take 10 mEq by mouth daily.   pramipexole 0.5 MG tablet Commonly known as: MIRAPEX Take 0.5 mg by mouth at bedtime.   pramipexole 0.25 MG tablet Commonly known as: MIRAPEX Take 0.25 mg by mouth daily. As needed for restless legs, see other bedtime listing   ProAir RespiClick 108 (90 Base) MCG/ACT Aepb Generic drug: Albuterol Sulfate Inhale 2 puffs into the lungs daily as needed (shortness of breath or wheezing).   albuterol 1.25 MG/3ML nebulizer solution Commonly known as: ACCUNEB Take 1 ampule by nebulization every 6 (six) hours as needed for shortness of breath.         Review of Systems  Immunization History  Administered Date(s)  Administered  . Influenza Split 01/06/2014, 01/15/2017, 01/08/2018, 12/09/2018  . Influenza Whole 01/07/2012, 01/06/2013  . Influenza, High Dose Seasonal PF 12/25/2015, 01/20/2017, 01/08/2023  . Influenza,inj,Quad PF,6+ Mos 12/21/2014  . Influenza-Unspecified 12/09/2018, 01/18/2020, 01/24/2021, 01/30/2022  . Moderna SARS-COV2 Booster Vaccination 04/02/2021  . Moderna Sars-Covid-2 Vaccination 04/12/2019, 06/05/2019, 02/15/2020, 09/05/2020  . PFIZER(Purple Top)SARS-COV-2 Vaccination 12/26/2020  . PPD Test 02/16/2022  . Pneumococcal Conjugate-13 02/01/2014  . Pneumococcal Polysaccharide-23 12/20/1992, 01/15/2000, 04/08/2004, 06/08/2004  . Rsv, Bivalent, Protein Subunit Rsvpref,pf Verdis Frederickson) 04/26/2022  . Td 04/08/2002, 04/22/2002  . Tdap 04/09/2011, 08/17/2015  . Zoster Recombinant(Shingrix) 05/29/2005, 08/27/2017, 06/13/2022  . Zoster, Live 04/08/2008, 05/29/2014, 05/28/2017  . Zoster, Unspecified 05/29/2005   Pertinent  Health Maintenance Due  Topic Date Due  . INFLUENZA VACCINE  Completed  . DEXA SCAN  Completed  . MAMMOGRAM  Discontinued      02/15/2022    3:00 PM 02/15/2022    9:00 PM 02/16/2022    8:00 AM 03/06/2022    7:25 AM 05/20/2022    3:42 PM  Fall Risk  Falls in the past year?     0  Was there an injury with Fall?     0  Fall Risk Category Calculator     0  (RETIRED) Patient Fall Risk Level Low fall risk Moderate fall risk Moderate fall risk High fall risk   Patient at Risk for Falls Due to     No Fall Risks  Fall risk Follow up     Falls evaluation completed   Functional Status Survey:    Vitals:   05/20/23 1530  BP: 107/74  Pulse: 87  Resp: 20  Temp: (!) 96.9 F (36.1 C)  SpO2: 92%  Weight: 166 lb 1.6 oz (75.3 kg)   Body mass index is 29.42 kg/m. Physical Exam  Labs reviewed: No results for input(s): "NA", "K", "CL", "CO2", "GLUCOSE", "BUN", "CREATININE", "CALCIUM", "MG", "PHOS"  in the last 8760 hours. No results for input(s): "AST", "ALT", "ALKPHOS", "BILITOT", "PROT", "ALBUMIN" in the last 8760 hours. No results for input(s): "WBC", "NEUTROABS", "HGB", "HCT", "MCV", "PLT" in the last 8760 hours. Lab Results  Component Value Date   TSH 1.198 02/15/2022   No results found for: "HGBA1C" Lab Results  Component Value Date   CHOL 147 09/27/2021   HDL 48 09/27/2021   LDLCALC 78 09/27/2021   TRIG 131 09/27/2021   CHOLHDL 3.6 06/27/2015    Significant Diagnostic Results in last 30 days:  No results found.  Assessment/Plan  No problem-specific Assessment & Plan notes found for this encounter.   Family/ staff Communication: plan of care reviewed with the patient and charge nurse.   Labs/tests ordered:  none

## 2023-05-21 DIAGNOSIS — J449 Chronic obstructive pulmonary disease, unspecified: Secondary | ICD-10-CM | POA: Diagnosis not present

## 2023-05-22 DIAGNOSIS — N390 Urinary tract infection, site not specified: Secondary | ICD-10-CM | POA: Diagnosis not present

## 2023-05-23 NOTE — Assessment & Plan Note (Signed)
Hgb 11.4 05/07/22

## 2023-05-23 NOTE — Assessment & Plan Note (Signed)
no active seizures, off meds.

## 2023-05-23 NOTE — Assessment & Plan Note (Signed)
K 3.2 05/07/22, taking  Kcl

## 2023-05-23 NOTE — Assessment & Plan Note (Signed)
Due to obstructive nephritis, s/p ureteral stent, hx of ureteral stone, urethral stricture.

## 2023-05-23 NOTE — Assessment & Plan Note (Signed)
taking MiraPex at bedtime and prn.

## 2023-05-23 NOTE — Assessment & Plan Note (Signed)
Stable, on ProAir, Albuterol neb.

## 2023-05-23 NOTE — Assessment & Plan Note (Signed)
stable, on Mirtazapine, off Lorazepam, no psychosis presently.

## 2023-05-23 NOTE — Assessment & Plan Note (Signed)
under Hospice service, weight stable.

## 2023-05-23 NOTE — Assessment & Plan Note (Signed)
under hospice service, prn Morphine available to her.

## 2023-05-23 NOTE — Assessment & Plan Note (Signed)
Heart rate is in control, continue Coreg.

## 2023-05-26 DIAGNOSIS — J449 Chronic obstructive pulmonary disease, unspecified: Secondary | ICD-10-CM | POA: Diagnosis not present

## 2023-05-28 DIAGNOSIS — J449 Chronic obstructive pulmonary disease, unspecified: Secondary | ICD-10-CM | POA: Diagnosis not present

## 2023-05-29 DIAGNOSIS — J449 Chronic obstructive pulmonary disease, unspecified: Secondary | ICD-10-CM | POA: Diagnosis not present

## 2023-06-02 ENCOUNTER — Non-Acute Institutional Stay (SKILLED_NURSING_FACILITY): Payer: Self-pay | Admitting: Sports Medicine

## 2023-06-02 DIAGNOSIS — N139 Obstructive and reflux uropathy, unspecified: Secondary | ICD-10-CM | POA: Diagnosis not present

## 2023-06-02 DIAGNOSIS — J449 Chronic obstructive pulmonary disease, unspecified: Secondary | ICD-10-CM | POA: Diagnosis not present

## 2023-06-02 DIAGNOSIS — M25472 Effusion, left ankle: Secondary | ICD-10-CM | POA: Diagnosis not present

## 2023-06-02 DIAGNOSIS — F0390 Unspecified dementia without behavioral disturbance: Secondary | ICD-10-CM | POA: Diagnosis not present

## 2023-06-02 NOTE — Progress Notes (Unsigned)
 Provider:  Dr. Venita Sheffield Location:  [name] Place of Service:   Skilled care  PCP: Mast, Man X, NP Patient Care Team: Mast, Man X, NP as PCP - General (Internal Medicine) Ernesto Rutherford, MD as Consulting Physician (Ophthalmology) Jake Bathe, MD as Consulting Physician (Cardiology) Venancio Poisson, MD as Consulting Physician (Dermatology) Ranee Gosselin, MD as Consulting Physician (Orthopedic Surgery) Eugenia Mcalpine, MD (Inactive) as Consulting Physician (Orthopedic Surgery) Bradly Bienenstock, MD as Consulting Physician (Orthopedic Surgery) [name] Mast, Man X, NP as Nurse Practitioner (Nurse Practitioner) York Spaniel, MD (Inactive) as Consulting Physician (Neurology)  Extended Emergency Contact Information Primary Emergency Contact: [name],[name] Address: [address]          [id number]          [place], [place] [address] [place] Home Phone: [phone] Relation: Sister Secondary Emergency Contact: Rocky Link [place] Home Phone: [phone] Mobile Phone: [phone] Relation: Sister  Goals of Care: Advanced Directive information    02/13/2023   10:15 AM  Advanced Directives  Does Patient Have a Medical Advance Directive? Yes  Type of Advance Directive Out of facility DNR (pink MOST or yellow form)  Does patient want to make changes to medical advance directive? No - Patient declined  Pre-existing out of facility DNR order (yellow form or pink MOST form) Yellow form placed in chart (order not valid for inpatient use);Pink MOST form placed in chart (order not valid for inpatient use)      History of Present Illness         89 yr old F with h/o nephritis, hypokalemia, anemia, bil lowwe oedema is evaluated for acute visit for left ankle pain and sqelling    Pt seen and examined in her room  Pt reports that she jammed her feet while using her scooter  C/o pain and swelling in her left  ankle  Pt has h/o chronic lower extremity swelling Denies no new change or worsening of her breathing    Past Medical History:  Diagnosis Date   Acute bronchitis 05/23/2011   Acute upper respiratory infections of unspecified site 05/23/2011   Arthritis    Chronic airway obstruction, not elsewhere classified 05/23/2011   Coronary artery disease    Disturbance of salivary secretion 01/31/2011   Dizziness and giddiness 01/31/2011   Dyspnea    Essential tremor 04/25/2014   External hemorrhoids without mention of complication 01/31/2011   Gait disorder 04/25/2014   GERD (gastroesophageal reflux disease)    History of kidney stones    Insomnia, unspecified 09/12/2011   Lumbago 01/31/2011   Major depressive disorder, single episode, unspecified 01/31/2011   Memory disorder 04/25/2014   Mitral valve disorders(424.0) 01/31/2011   Other and unspecified hyperlipidemia 01/31/2011   Other convulsions 01/31/2011   Other emphysema (HCC) 01/31/2011   Pain in joint, site unspecified 01/31/2011   Restless legs syndrome (RLS) 09/12/2011   Retinal detachment with retinal defect of right eye 2011   right eye twice   Seizure disorder (HCC)    Senile osteoporosis 01/31/2011   Spontaneous ecchymoses 01/31/2011   Squamous cell carcinoma of leg    Stiffness of joints, not elsewhere classified, multiple sites 01/31/2011   Unspecified essential hypertension 01/31/2011   Past Surgical History:  Procedure Laterality Date   ABDOMINAL HYSTERECTOMY  06/21/2003   TAH/BSO, omenectomy PSB resect, Stg IC cystadenofibroma   CHOLECYSTECTOMY  2005   Dr. Carolynne Edouard   CYSTOSCOPY W/ URETERAL STENT PLACEMENT Right  02/13/2022   Procedure: CYSTOSCOPY WITH RETROGRADE PYELOGRAM/URETERAL STENT PLACEMENT, AND SUPRAPUBIC TUBE EXCHANGE;  Surgeon: Sebastian Ache, MD;  Location: [facility];  Service: Urology;  Laterality: Right;   CYSTOSCOPY WITH RETROGRADE PYELOGRAM, URETEROSCOPY AND STENT PLACEMENT Right 03/06/2022   Procedure:  CYSTOSCOPY WITH RETROGRADE PYELOGRAM, URETEROSCOPY AND STENT REPLACEMENT, SUPRAPUBIC CATHETER EXCHANGE;  Surgeon: Sebastian Ache, MD;  Location: [facility];  Service: Urology;  Laterality: Right;   ELBOW SURGERY Right 2008   broken   Dr. Orlan Leavens   EYE SURGERY     HOLMIUM LASER APPLICATION Right 03/06/2022   Procedure: HOLMIUM LASER APPLICATION;  Surgeon: Sebastian Ache, MD;  Location: [facility];  Service: Urology;  Laterality: Right;   INTRAMEDULLARY (IM) NAIL INTERTROCHANTERIC Left 08/30/2021   Procedure: INTRAMEDULLARY (IM) NAIL INTERTROCHANTRIC;  Surgeon: Samson Frederic, MD;  Location: [facility];  Service: Orthopedics;  Laterality: Left;   ORIF PATELLA Left 05/02/2020   Procedure: OPEN REDUCTION INTERNAL (ORIF) FIXATION LEFT PATELLA WITH MEDIAL AND LATERAL LIGAMENT REINFORCEMENTS;  Surgeon: Sheral Apley, MD;  Location: [facility];  Service: Orthopedics;  Laterality: Left;   ORIF PATELLA Left 05/30/2020   Procedure: OPEN REDUCTION INTERNAL (ORIF) FIXATION PATELLA;  Surgeon: Sheral Apley, MD;  Location: [facility];  Service: Orthopedics;  Laterality: Left;   RETINAL DETACHMENT SURGERY N/A    two   REVERSE SHOULDER ARTHROPLASTY Left 05/06/2019   Procedure: REVERSE SHOULDER ARTHROPLASTY;  Surgeon: Francena Hanly, MD;  Location: [facility];  Service: Orthopedics;  Laterality: Left;    ROTATOR CUFF REPAIR Right 2012   Dr. Thomasena Edis   SQUAMOUS CELL CARCINOMA EXCISION Bilateral 2012, 8/14   [place] on legs   Dr. Irene Limbo   TONSILLECTOMY  1941   VIDEO BRONCHOSCOPY WITH ENDOBRONCHIAL NAVIGATION N/A 11/29/2015   Procedure: VIDEO BRONCHOSCOPY WITH ENDOBRONCHIAL NAVIGATION;  Surgeon: Leslye Peer, MD;  Location: [facility];  Service: Thoracic;  Laterality: N/A;    reports that she quit smoking about 35 years ago. Her smoking use included cigarettes. She started smoking about 75 years ago. She has a 40 pack-year smoking history. She has never used smokeless tobacco. She reports that she does not drink alcohol and does  not use drugs. Social History   Socioeconomic History   Marital status: Divorced    Spouse name: Not on file   Number of children: 0   Years of education: Not on file   Highest education level: Not on file  Occupational History   Occupation: retired Programmer, systems for special students  Tobacco Use   Smoking status: Former    Current packs/day: 0.00    Average packs/day: 1 pack/day for 40.0 years (40.0 ttl pk-yrs)    Types: Cigarettes    Start date: 04/08/1948    Quit date: 04/08/1988    Years since quitting: 35.1   Smokeless tobacco: Never   Tobacco comments:    quit in 1990  Vaping Use   Vaping status: Never Used  Substance and Sexual Activity   Alcohol use: No    Alcohol/week: 0.0 standard drinks of alcohol   Drug use: No   Sexual activity: Never  Other Topics Concern   Not on file  Social History Narrative   L[facility] at [name]   No children   Divorced   Exercise climbs steps 4 flights daily PT for balance   Alcohol none   Stopped smoking 1989   Patient drinks 3 large sodas daily.   Patient is right handed.   Social Drivers of Health  Financial Resource Strain: Not on file  Food Insecurity: No Food Insecurity (02/12/2022)   Hunger Vital Sign    Worried About Running Out of Food in the Last Year: Never true    Ran Out of Food in the Last Year: Never true  Transportation Needs: No Transportation Needs (02/12/2022)   PRAPARE - Administrator, Civil Service (Medical): No    Lack of Transportation (Non-Medical): No  Physical Activity: Not on file  Stress: Not on file  Social Connections: Not on file  Intimate Partner Violence: Not At Risk (02/12/2022)   Humiliation, Afraid, Rape, and Kick questionnaire    Fear of Current or Ex-Partner: No    Emotionally Abused: No    Physically Abused: No    Sexually Abused: No    Functional Status Survey:    Family History  Problem Relation Age of Onset   Heart disease Father        CHF   Cancer Mother         breast   Seizures Sister     Health Maintenance  Topic Date Due   DTaP/Tdap/Td (5 - Td or Tdap) 08/16/2025   Pneumonia Vaccine 1+ Years old  Completed   INFLUENZA VACCINE  Completed   DEXA SCAN  Completed   Zoster Vaccines- Shingrix  Completed   HPV VACCINES  Aged Out   MAMMOGRAM  Discontinued   COVID-19 Vaccine  Discontinued    Allergies  Allergen Reactions   Vioxx [Rofecoxib] Shortness Of Breath   Dyazide [Hydrochlorothiazide W-Triamterene] Other (See Comments)    Lowers blood pressure too much   Latex Swelling and Other (See Comments)    "Allergic," per [facility]   Sulfa Antibiotics Nausea And Vomiting and Other (See Comments)    "Allergic," per [facility]   Sumycin [Tetracycline] Other (See Comments)    Can't take due to drug reaction- "Allergic," per [facility]    Outpatient Encounter Medications as of 06/02/2023  Medication Sig   acetaminophen (TYLENOL) 325 MG tablet Take 650 mg by mouth every 6 (six) hours as needed for fever or mild pain. Give 2 tablet by mouth every 6 hours as needed for Pain   acetic acid 0.25 % irrigation Irrigate with 1 Application as directed 2 (two) times daily.   albuterol (ACCUNEB) 1.25 MG/3ML nebulizer solution Take 1 ampule by nebulization every 6 (six) hours as needed for shortness of breath.   Albuterol Sulfate (PROAIR RESPICLICK) 108 (90 Base) MCG/ACT AEPB Inhale 2 puffs into the lungs daily as needed (shortness of breath or wheezing).   Benzocaine-Menthol (ORAJEL 2X TOOTHACHE & GUM) 20-0.26 % GEL Use as directed 1 Dose in the mouth or throat every 6 (six) hours as needed (Tooth Pain).   carvedilol (COREG) 3.125 MG tablet Take 1 tablet (3.125 mg total) by mouth 2 (two) times daily with a meal.   docusate sodium (COLACE) 100 MG capsule Take 100 mg by mouth as needed for mild constipation.   ketoconazole (NIZORAL) 2 % shampoo Apply 1 Application topically 2 (two) times a week. Monday and Thursday   mirtazapine (REMERON) 7.5 MG tablet Take 7.5 mg by mouth  daily.   Morphine Sulfate, Concentrate, 5 MG/0.25ML SOLN Take 0.25 mLs by mouth every 8 (eight) hours as needed.   NYAMYC powder Apply 1 Application topically daily as needed.   nystatin cream (MYCOSTATIN) Apply 1 Application topically 2 (two) times daily.   oxybutynin (DITROPAN) 5 MG tablet Take 5 mg by mouth 2 (two) times daily.  potassium chloride (MICRO-K) 10 MEQ CR capsule Take 10 mEq by mouth daily.   pramipexole (MIRAPEX) 0.25 MG tablet Take 0.25 mg by mouth daily. As needed for restless legs, see other bedtime listing   pramipexole (MIRAPEX) 0.5 MG tablet Take 0.5 mg by mouth at bedtime.   No facility-administered encounter medications on file as of 06/02/2023.    Review of Systems  Constitutional:  Negative for fever.  Respiratory:  Negative for cough and shortness of breath.   Cardiovascular:  Positive for leg swelling.  Genitourinary:  Negative for dysuria.  Neurological:  Negative for dizziness.   Negative unless indicated in HPI.  There were no vitals filed for this visit. There is no height or weight on file to calculate BMI. BP Readings from Last 3 Encounters:  05/20/23 107/74  04/21/23 130/72  03/31/23 (!) 156/86   Wt Readings from Last 3 Encounters:  05/20/23 166 lb 1.6 oz (75.3 kg)  03/31/23 161 lb 14.4 oz (73.4 kg)  03/13/23 158 lb 14.4 oz (72.1 kg)   Physical Exam Constitutional:      Appearance: Normal appearance.  HENT:     Head: Normocephalic and atraumatic.  Cardiovascular:     Rate and Rhythm: Normal rate and regular rhythm.  Pulmonary:     Effort: Pulmonary effort is normal. No respiratory distress.     Breath sounds: Normal breath sounds. No wheezing.  Abdominal:     General: Bowel sounds are normal. There is no distension.     Tenderness: There is no abdominal tenderness. There is no guarding or rebound.     Comments:    Musculoskeletal:     Comments: Left ankle - mild swelling Tenderness on palpation  Slight erythema of Left great toe    Neurological:     Mental Status: She is alert. Mental status is at baseline.     Sensory: No sensory deficit.     Motor: No weakness.     Labs reviewed: Basic Metabolic Panel: No results for input(s): "NA", "K", "CL", "CO2", "GLUCOSE", "BUN", "CREATININE", "CALCIUM", "MG", "PHOS" in the last 8760 hours. Liver Function Tests: No results for input(s): "AST", "ALT", "ALKPHOS", "BILITOT", "PROT", "ALBUMIN" in the last 8760 hours. No results for input(s): "LIPASE", "AMYLASE" in the last 8760 hours. No results for input(s): "AMMONIA" in the last 8760 hours. CBC: No results for input(s): "WBC", "NEUTROABS", "HGB", "HCT", "MCV", "PLT" in the last 8760 hours. Cardiac Enzymes: No results for input(s): "CKTOTAL", "CKMB", "CKMBINDEX", "TROPONINI" in the last 8760 hours. BNP: Invalid input(s): "POCBNP" No results found for: "HGBA1C" Lab Results  Component Value Date   TSH 1.198 02/15/2022   Lab Results  Component Value Date   VITAMINB12 249 09/05/2021   Lab Results  Component Value Date   FOLATE 23.1 09/05/2021   Lab Results  Component Value Date   IRON 64 09/05/2021   TIBC 369 09/05/2021   FERRITIN 31 09/05/2021    Imaging and Procedures obtained prior to SNF admission: DG C-Arm 1-60 Min-No Report Result Date: 03/06/2022 Fluoroscopy was utilized by the requesting physician.  No radiographic interpretation.   DG C-Arm 1-60 Min-No Report Result Date: 03/06/2022 Fluoroscopy was utilized by the requesting physician.  No radiographic interpretation.    Assessment and Plan   1. Left ankle swelling (Primary) Pt c/o pain in left ankle  Moderate swelling + Tender to palpation  Will get x ray ankle Take tylenol prn for pain      2. Dementia without behavioral disturbance (HCC)  Cont  with supportive care  3. Lower obstructive uropathy Supra pubuc catheter in place    30 min Total time spent for obtaining history,  performing a medically appropriate examination and  evaluation, reviewing the tests,ordering  tests,  documenting clinical information in the electronic or other health record,  ,care coordination (not separately reported)

## 2023-06-04 DIAGNOSIS — J449 Chronic obstructive pulmonary disease, unspecified: Secondary | ICD-10-CM | POA: Diagnosis not present

## 2023-06-05 ENCOUNTER — Encounter: Payer: Self-pay | Admitting: Sports Medicine

## 2023-06-05 DIAGNOSIS — J449 Chronic obstructive pulmonary disease, unspecified: Secondary | ICD-10-CM | POA: Diagnosis not present

## 2023-06-09 DIAGNOSIS — J449 Chronic obstructive pulmonary disease, unspecified: Secondary | ICD-10-CM | POA: Diagnosis not present

## 2023-06-10 DIAGNOSIS — J449 Chronic obstructive pulmonary disease, unspecified: Secondary | ICD-10-CM | POA: Diagnosis not present

## 2023-06-12 DIAGNOSIS — J449 Chronic obstructive pulmonary disease, unspecified: Secondary | ICD-10-CM | POA: Diagnosis not present

## 2023-06-16 ENCOUNTER — Non-Acute Institutional Stay (SKILLED_NURSING_FACILITY): Payer: Self-pay | Admitting: Sports Medicine

## 2023-06-16 ENCOUNTER — Encounter: Payer: Self-pay | Admitting: Sports Medicine

## 2023-06-16 DIAGNOSIS — F0390 Unspecified dementia without behavioral disturbance: Secondary | ICD-10-CM

## 2023-06-16 DIAGNOSIS — G2581 Restless legs syndrome: Secondary | ICD-10-CM

## 2023-06-16 DIAGNOSIS — I10 Essential (primary) hypertension: Secondary | ICD-10-CM

## 2023-06-16 DIAGNOSIS — Z515 Encounter for palliative care: Secondary | ICD-10-CM

## 2023-06-16 DIAGNOSIS — Z9359 Other cystostomy status: Secondary | ICD-10-CM

## 2023-06-16 DIAGNOSIS — J449 Chronic obstructive pulmonary disease, unspecified: Secondary | ICD-10-CM | POA: Diagnosis not present

## 2023-06-16 NOTE — Progress Notes (Signed)
 Location:  [name] Room Number: 29-A Place of Service:  SNF (31) Provider:  Clelia Trabucco,MD  Mast, Man X, NP  Patient Care Team: Mast, Man X, NP as PCP - General (Internal Medicine) Ernesto Rutherford, MD as Consulting Physician (Ophthalmology) Jake Bathe, MD as Consulting Physician (Cardiology) Venancio Poisson, MD as Consulting Physician (Dermatology) Ranee Gosselin, MD as Consulting Physician (Orthopedic Surgery) Eugenia Mcalpine, MD (Inactive) as Consulting Physician (Orthopedic Surgery) Bradly Bienenstock, MD as Consulting Physician (Orthopedic Surgery) [name] Mast, Man X, NP as Nurse Practitioner (Nurse Practitioner) York Spaniel, MD (Inactive) as Consulting Physician (Neurology)  Extended Emergency Contact Information Primary Emergency Contact: [name],[name] Address: [address]          [id number]          [place], [place] [address] [place] of [place] Home Phone: [phone] Relation: Sister Secondary Emergency Contact: Rocky [place] Home Phone: [phone] Mobile Phone: [phone] Relation: Sister  Code Status:  DNR Goals of care: Advanced Directive information    06/16/2023    2:59 PM  Advanced Directives  Does Patient Have a Medical Advance Directive? Yes  Type of Advance Directive Out of facility DNR (pink MOST or yellow form)  Does patient want to make changes to medical advance directive? No - Patient declined  Pre-existing out of facility DNR order (yellow form or pink MOST form) Pink MOST/Yellow Form most recent copy in chart - Physician notified to receive inpatient order     Chief Complaint  Patient presents with   Medical Management of Chronic Issues    Routine visit                                Hospice related visit  HPI:  Pt is a 89 y.o. female seen today for medical management of chronic diseases.    Pt seen and examined in her room, she is sitting in her  power scooter  C/o exertional SOB but able to speak in full sentences, does not appear to be in distress She has chronic lower extremity swelling but no recent worsening     H/o Supra pubic catheter  Denies lower abdominal pain, nausea, vomiting  Failure to thrive  Pt is currently on hospice  06/07/2023 12:49 165.9 Lbs (Wheelchair) 05/19/2023 14:35 166.1 Lbs (Wheelchair) +3.0% change from last weight [ Comparison Weight 04/09/2023, 160.0 lbs, +3.8%, +6.1 lbs] 05/10/2023 11:52 166.1 Lbs (Wheelchair) 04/09/2023 14:34 160 Lbs (Wheelchair) 03/17/2023 11:20 161.9 Lbs (Wheelchair) 02/14/2023 14:35 158.9 Lbs (Wheelchair) 02/07/2023 18:01 158.9 Lbs (Wheelchair  Depression  On rameron   Past Medical History:  Diagnosis Date   Acute bronchitis 05/23/2011   Acute upper respiratory infections of unspecified site 05/23/2011   Arthritis    Chronic airway obstruction, not elsewhere classified 05/23/2011   Coronary artery disease    Disturbance of salivary secretion 01/31/2011   Dizziness and giddiness 01/31/2011   Dyspnea    Essential tremor 04/25/2014   External hemorrhoids without mention of complication 01/31/2011   Gait disorder 04/25/2014   GERD (gastroesophageal reflux disease)    History of kidney stones    Insomnia, unspecified 09/12/2011   Lumbago 01/31/2011   Major depressive disorder, single episode, unspecified 01/31/2011   Memory disorder 04/25/2014   Mitral valve disorders(424.0) 01/31/2011   Other and unspecified hyperlipidemia 01/31/2011   Other convulsions 01/31/2011   Other emphysema (HCC) 01/31/2011   Pain in  joint, site unspecified 01/31/2011   Restless legs syndrome (RLS) 09/12/2011   Retinal detachment with retinal defect of right eye 2011   right eye twice   Seizure disorder (HCC)    Senile osteoporosis 01/31/2011   Spontaneous ecchymoses 01/31/2011   Squamous cell carcinoma of leg    Stiffness of joints, not elsewhere classified, multiple sites  01/31/2011   Unspecified essential hypertension 01/31/2011   Past Surgical History:  Procedure Laterality Date   ABDOMINAL HYSTERECTOMY  06/21/2003   TAH/BSO, omenectomy PSB resect, Stg IC cystadenofibroma   CHOLECYSTECTOMY  2005   Dr. Carolynne Edouard   CYSTOSCOPY W/ URETERAL STENT PLACEMENT Right 02/13/2022   Procedure: CYSTOSCOPY WITH RETROGRADE PYELOGRAM/URETERAL STENT PLACEMENT, AND SUPRAPUBIC TUBE EXCHANGE;  Surgeon: Sebastian Ache, MD;  Location: [facility];  Service: Urology;  Laterality: Right;   CYSTOSCOPY WITH RETROGRADE PYELOGRAM, URETEROSCOPY AND STENT PLACEMENT Right 03/06/2022   Procedure: CYSTOSCOPY WITH RETROGRADE PYELOGRAM, URETEROSCOPY AND STENT REPLACEMENT, SUPRAPUBIC CATHETER EXCHANGE;  Surgeon: Sebastian Ache, MD;  Location: [facility];  Service: Urology;  Laterality: Right;   ELBOW SURGERY Right 2008   broken   Dr. Orlan Leavens   EYE SURGERY     HOLMIUM LASER APPLICATION Right 03/06/2022   Procedure: HOLMIUM LASER APPLICATION;  Surgeon: Sebastian Ache, MD;  Location: [facility];  Service: Urology;  Laterality: Right;   INTRAMEDULLARY (IM) NAIL INTERTROCHANTERIC Left 08/30/2021   Procedure: INTRAMEDULLARY (IM) NAIL INTERTROCHANTRIC;  Surgeon: Samson Frederic, MD;  Location: [facility];  Service: Orthopedics;  Laterality: Left;   ORIF PATELLA Left 05/02/2020   Procedure: OPEN REDUCTION INTERNAL (ORIF) FIXATION LEFT PATELLA WITH MEDIAL AND LATERAL LIGAMENT REINFORCEMENTS;  Surgeon: Sheral Apley, MD;  Location: [facility];  Service: Orthopedics;  Laterality: Left;   ORIF PATELLA Left 05/30/2020   Procedure: OPEN REDUCTION INTERNAL (ORIF) FIXATION PATELLA;  Surgeon: Sheral Apley, MD;  Location: [facility];  Service: Orthopedics;  Laterality: Left;   RETINAL DETACHMENT SURGERY N/A    two   REVERSE SHOULDER ARTHROPLASTY Left 05/06/2019   Procedure: REVERSE SHOULDER ARTHROPLASTY;  Surgeon: Francena Hanly, MD;  Location: [facility];  Service: Orthopedics;  Laterality: Left;    ROTATOR CUFF REPAIR Right 2012    Dr. Thomasena Edis   SQUAMOUS CELL CARCINOMA EXCISION Bilateral 2012, 8/14   [place] on legs   Dr. Irene Limbo   TONSILLECTOMY  1941   VIDEO BRONCHOSCOPY WITH ENDOBRONCHIAL NAVIGATION N/A 11/29/2015   Procedure: VIDEO BRONCHOSCOPY WITH ENDOBRONCHIAL NAVIGATION;  Surgeon: Leslye Peer, MD;  Location: [facility];  Service: Thoracic;  Laterality: N/A;    Allergies  Allergen Reactions   Vioxx [Rofecoxib] Shortness Of Breath   Dyazide [Hydrochlorothiazide W-Triamterene] Other (See Comments)    Lowers blood pressure too much   Latex Swelling and Other (See Comments)    "Allergic," per [facility]   Sulfa Antibiotics Nausea And Vomiting and Other (See Comments)    "Allergic," per [facility]   Sumycin [Tetracycline] Other (See Comments)    Can't take due to drug reaction- "Allergic," per [facility]    Outpatient Encounter Medications as of 06/16/2023  Medication Sig   acetaminophen (TYLENOL) 325 MG tablet Take 650 mg by mouth every 6 (six) hours as needed for fever or mild pain. Give 2 tablet by mouth every 6 hours as needed for Pain   acetic acid 0.25 % irrigation Irrigate with 1 Application as directed 2 (two) times daily.   albuterol (ACCUNEB) 1.25 MG/3ML nebulizer solution Take 1 ampule by nebulization every 6 (six) hours as needed for shortness of  breath.   Albuterol Sulfate (PROAIR RESPICLICK) 108 (90 Base) MCG/ACT AEPB Inhale 2 puffs into the lungs daily as needed (shortness of breath or wheezing).   Benzocaine-Menthol (ORAJEL 2X TOOTHACHE & GUM) 20-0.26 % GEL Use as directed 1 Dose in the mouth or throat every 6 (six) hours as needed (Tooth Pain).   carvedilol (COREG) 3.125 MG tablet Take 1 tablet (3.125 mg total) by mouth 2 (two) times daily with a meal.   docusate sodium (COLACE) 100 MG capsule Take 100 mg by mouth as needed for mild constipation.   ketoconazole (NIZORAL) 2 % shampoo Apply 1 Application topically 2 (two) times a week. Monday and Thursday   mirtazapine (REMERON) 7.5 MG tablet Take 7.5 mg by mouth daily.    Morphine Sulfate, Concentrate, 5 MG/0.25ML SOLN Take 0.25 mLs by mouth every 8 (eight) hours as needed.   NYAMYC powder Apply 1 Application topically daily as needed.   nystatin cream (MYCOSTATIN) Apply 1 Application topically 2 (two) times daily.   potassium chloride (MICRO-K) 10 MEQ CR capsule Take 10 mEq by mouth daily.   pramipexole (MIRAPEX) 0.25 MG tablet Take 0.25 mg by mouth daily. As needed for restless legs, see other bedtime listing   oxybutynin (DITROPAN) 5 MG tablet Take 5 mg by mouth 2 (two) times daily. (Patient not taking: Reported on 06/16/2023)   pramipexole (MIRAPEX) 0.5 MG tablet Take 0.5 mg by mouth at bedtime. (Patient not taking: Reported on 06/16/2023)   No facility-administered encounter medications on file as of 06/16/2023.    Review of Systems  Constitutional:  Negative for fever.  Respiratory:  Negative for cough and shortness of breath.   Cardiovascular:  Negative for leg swelling.  Gastrointestinal:  Negative for abdominal pain.  Genitourinary:  Negative for dysuria and hematuria.    Immunization History  Administered Date(s) Administered   Influenza Split 01/06/2014, 01/15/2017, 01/08/2018, 12/09/2018   Influenza Whole 01/07/2012, 01/06/2013   Influenza, High Dose Seasonal PF 12/25/2015, 01/20/2017, 01/08/2023   Influenza,inj,Quad PF,6+ Mos 12/21/2014   Influenza-Unspecified 12/09/2018, 01/18/2020, 01/24/2021, 01/30/2022   Moderna SARS-COV2 Booster Vaccination 04/02/2021   Moderna Sars-Covid-2 Vaccination 04/12/2019, 06/05/2019, 02/15/2020, 09/05/2020   PFIZER(Purple Top)SARS-COV-2 Vaccination 12/26/2020   PPD Test 02/16/2022   Pneumococcal Conjugate-13 02/01/2014   Pneumococcal Polysaccharide-23 12/20/1992, 01/15/2000, 04/08/2004, 06/08/2004   Rsv, Bivalent, Protein Subunit Rsvpref,pf Verdis Frederickson) 04/26/2022   Td 04/08/2002, 04/22/2002   Tdap 04/09/2011, 08/17/2015   Unspecified SARS-COV-2 Vaccination 01/22/2023   Zoster Recombinant(Shingrix)  05/29/2005, 08/27/2017, 06/13/2022   Zoster, Live 04/08/2008, 05/29/2014, 05/28/2017   Zoster, Unspecified 05/29/2005   Pertinent  Health Maintenance Due  Topic Date Due   INFLUENZA VACCINE  Completed   DEXA SCAN  Completed   MAMMOGRAM  Discontinued      02/15/2022    3:00 PM 02/15/2022    9:00 PM 02/16/2022    8:00 AM 03/06/2022    7:25 AM 05/20/2022    3:42 PM  Fall Risk  Falls in the past year?     0  Was there an injury with Fall?     0  Fall Risk Category Calculator     0  (RETIRED) Patient Fall Risk Level Low fall risk Moderate fall risk Moderate fall risk High fall risk   Patient at Risk for Falls Due to     No Fall Risks  Fall risk Follow up     Falls evaluation completed   Functional Status Survey:    Vitals:   06/16/23 1457  BP: 136/78  Pulse:  91  Resp: 18  Temp: 98.2 F (36.8 C)  SpO2: 96%  Weight: 165 lb 14.4 oz (75.3 kg)  Height: 5\' 3"  (1.6 m)   Body mass index is 29.39 kg/m. Physical Exam Constitutional:      Appearance: Normal appearance.  HENT:     Head: Normocephalic and atraumatic.  Cardiovascular:     Rate and Rhythm: Normal rate and regular rhythm.  Pulmonary:     Effort: Pulmonary effort is normal. No respiratory distress.     Breath sounds: Normal breath sounds. No wheezing.  Abdominal:     General: Bowel sounds are normal. There is no distension.     Tenderness: There is no abdominal tenderness. There is no guarding or rebound.     Comments:    Musculoskeletal:        General: No swelling.  Skin:    General: Skin is dry.  Neurological:     Mental Status: She is alert. Mental status is at baseline.     Labs reviewed: No results for input(s): "NA", "K", "CL", "CO2", "GLUCOSE", "BUN", "CREATININE", "CALCIUM", "MG", "PHOS" in the last 8760 hours. No results for input(s): "AST", "ALT", "ALKPHOS", "BILITOT", "PROT", "ALBUMIN" in the last 8760 hours. No results for input(s): "WBC", "NEUTROABS", "HGB", "HCT", "MCV", "PLT" in the last  8760 hours. Lab Results  Component Value Date   TSH 1.198 02/15/2022   No results found for: "HGBA1C" Lab Results  Component Value Date   CHOL 147 09/27/2021   HDL 48 09/27/2021   LDLCALC 78 09/27/2021   TRIG 131 09/27/2021   CHOLHDL 3.6 06/27/2015    Significant Diagnostic Results in last 30 days:  No results found.  Assessment/Plan   1. Essential hypertension (Primary) At goal Cont with the same  2. Dementia without behavioral disturbance (HCC) No agitation  Cont with supportive care  3. Hospice care patient Pt seems comfortable  Does not appear to be in distress Cont with morphine prn for pain  4. Restless legs syndrome (RLS) Cont with pramiprexole  5. Suprapubic catheter (HCC) Cont with catheter care  30 min  Total time spent for obtaining history,  performing a medically appropriate examination and evaluation, reviewing the tests,  documenting clinical information in the electronic or other health record,  ,care coordination (not separately reported)

## 2023-06-18 DIAGNOSIS — J449 Chronic obstructive pulmonary disease, unspecified: Secondary | ICD-10-CM | POA: Diagnosis not present

## 2023-06-19 DIAGNOSIS — J449 Chronic obstructive pulmonary disease, unspecified: Secondary | ICD-10-CM | POA: Diagnosis not present

## 2023-06-22 ENCOUNTER — Encounter: Payer: Self-pay | Admitting: Sports Medicine

## 2023-06-23 DIAGNOSIS — J449 Chronic obstructive pulmonary disease, unspecified: Secondary | ICD-10-CM | POA: Diagnosis not present

## 2023-06-25 DIAGNOSIS — J449 Chronic obstructive pulmonary disease, unspecified: Secondary | ICD-10-CM | POA: Diagnosis not present

## 2023-06-26 DIAGNOSIS — J449 Chronic obstructive pulmonary disease, unspecified: Secondary | ICD-10-CM | POA: Diagnosis not present

## 2023-06-30 DIAGNOSIS — J449 Chronic obstructive pulmonary disease, unspecified: Secondary | ICD-10-CM | POA: Diagnosis not present

## 2023-07-02 DIAGNOSIS — J449 Chronic obstructive pulmonary disease, unspecified: Secondary | ICD-10-CM | POA: Diagnosis not present

## 2023-07-03 DIAGNOSIS — J449 Chronic obstructive pulmonary disease, unspecified: Secondary | ICD-10-CM | POA: Diagnosis not present

## 2023-07-18 DIAGNOSIS — F411 Generalized anxiety disorder: Secondary | ICD-10-CM | POA: Diagnosis not present

## 2023-07-18 DIAGNOSIS — F331 Major depressive disorder, recurrent, moderate: Secondary | ICD-10-CM | POA: Diagnosis not present

## 2023-07-29 ENCOUNTER — Encounter: Payer: Self-pay | Admitting: Nurse Practitioner

## 2023-07-29 ENCOUNTER — Non-Acute Institutional Stay (SKILLED_NURSING_FACILITY): Payer: Self-pay | Admitting: Nurse Practitioner

## 2023-07-29 DIAGNOSIS — F323 Major depressive disorder, single episode, severe with psychotic features: Secondary | ICD-10-CM

## 2023-07-29 DIAGNOSIS — R609 Edema, unspecified: Secondary | ICD-10-CM | POA: Diagnosis not present

## 2023-07-29 DIAGNOSIS — R4189 Other symptoms and signs involving cognitive functions and awareness: Secondary | ICD-10-CM

## 2023-07-29 DIAGNOSIS — N139 Obstructive and reflux uropathy, unspecified: Secondary | ICD-10-CM | POA: Diagnosis not present

## 2023-07-29 DIAGNOSIS — I471 Supraventricular tachycardia, unspecified: Secondary | ICD-10-CM

## 2023-07-29 DIAGNOSIS — R4689 Other symptoms and signs involving appearance and behavior: Secondary | ICD-10-CM

## 2023-07-29 DIAGNOSIS — E876 Hypokalemia: Secondary | ICD-10-CM | POA: Diagnosis not present

## 2023-07-29 DIAGNOSIS — G2581 Restless legs syndrome: Secondary | ICD-10-CM

## 2023-07-29 DIAGNOSIS — R569 Unspecified convulsions: Secondary | ICD-10-CM

## 2023-07-29 NOTE — Assessment & Plan Note (Signed)
 under hospice service, prn Morphine available to her.

## 2023-07-29 NOTE — Assessment & Plan Note (Signed)
takes Coreg

## 2023-07-29 NOTE — Assessment & Plan Note (Signed)
 Stable, on ProAir, Albuterol neb.

## 2023-07-29 NOTE — Assessment & Plan Note (Signed)
 stable, on Mirtazapine , off Lorazepam 

## 2023-07-29 NOTE — Assessment & Plan Note (Signed)
 SPC, off Uribel and Oxybutynin .

## 2023-07-29 NOTE — Progress Notes (Unsigned)
 Location:  [name] Room Number: N029-A Place of Service:  SNF (31) Provider:  Peony Barner X, NP  Patient Care Team: Nolan Lasser X, NP as PCP - General (Internal Medicine) Maris Sickle, MD as Consulting Physician (Ophthalmology) Hugh Madura, MD as Consulting Physician (Cardiology) Avis Boehringer, MD as Consulting Physician (Dermatology) Hazle Lites, MD as Consulting Physician (Orthopedic Surgery) Genevie Kerns, MD (Inactive) as Consulting Physician (Orthopedic Surgery) Arvil Birks, MD as Consulting Physician (Orthopedic Surgery) [name] Danae Oland X, NP as Nurse Practitioner (Nurse Practitioner) Brian Campanile, MD (Inactive) as Consulting Physician (Neurology)  Extended Emergency Contact Information Primary Emergency Contact: [name],[name] Address: [address]          [address]          [place], [place] [address] [place] Home Phone: [phone] Relation: Sister Secondary Emergency Contact: [name],[name]  [place] Home Phone: [phone] Mobile Phone: [phone] Relation: Sister  Code Status:  DNR Goals of care: Advanced Directive information    07/29/2023   10:40 AM  Advanced Directives  Does Patient Have a Medical Advance Directive? Yes  Type of Advance Directive Out of facility DNR (pink MOST or yellow form)  Does patient want to make changes to medical advance directive? No - Patient declined  Pre-existing out of facility DNR order (yellow form or pink MOST form) Pink MOST/Yellow Form most recent copy in chart - Physician notified to receive inpatient order     Chief Complaint  Patient presents with  . Medical Management of Chronic Issues    Routine Visit    HPI:  Pt is a 89 y.o. female seen today for medical management of chronic diseases.   02/12/22 Obstructive nephritis, underwent cystoscopy with right retrograde pyelogram, right ureteral stent placed, f/u Urology Dr.  Secundino Dach, 03/06/22 cystoscopy with retrograde pyelogram, ureteroscopy and stent replacement, SPC exchange.                05/03/22 abd US  no renal or gallbladder stone or hydronephrosis. 05/02/22 Xray abd may represent ileus. Hx of Ureteral stone with hydronephrosis per CT abd                          05/13/22 Urology: R ureteral stone, sp R ureteroscopy stone free 03/2022, stent. SPC  urethral stricture, off Oxybutynin . Hx of Ureteral stone with hydronephrosis per CT abd             External hemorrhoids, placed on Colace             Hypokalemia, K 3.2 05/07/22, taking  Kcl             Anemia, Hgb 11.4 05/07/22             SVT, takes Coreg              BLE edema, mild,  off Furosemide .              Adult failure to thrive, under Hospice service, weight stable.              Suprapubic Catheter, f/u Urology, off Uribel and Oxybutynin .              Depression, stable, on Mirtazapine , off Lorazepam              Dementia, under hospice service, prn Morphine  available to her.  Restless leg syndrome,  taking MiraPex               Hx of seizures, no active seizures, off meds.              COPD, on ProAir , Albuterol  neb.        Past Medical History:  Diagnosis Date  . Acute bronchitis 05/23/2011  . Acute upper respiratory infections of unspecified site 05/23/2011  . Arthritis   . Chronic airway obstruction, not elsewhere classified 05/23/2011  . Coronary artery disease   . Disturbance of salivary secretion 01/31/2011  . Dizziness and giddiness 01/31/2011  . Dyspnea   . Essential tremor 04/25/2014  . External hemorrhoids without mention of complication 01/31/2011  . Gait disorder 04/25/2014  . GERD (gastroesophageal reflux disease)   . History of kidney stones   . Insomnia, unspecified 09/12/2011  . Lumbago 01/31/2011  . Major depressive disorder, single episode, unspecified 01/31/2011  . Memory disorder 04/25/2014  . Mitral valve disorders(424.0) 01/31/2011  . Other and unspecified  hyperlipidemia 01/31/2011  . Other convulsions 01/31/2011  . Other emphysema (HCC) 01/31/2011  . Pain in joint, site unspecified 01/31/2011  . Restless legs syndrome (RLS) 09/12/2011  . Retinal detachment with retinal defect of right eye 2011   right eye twice  . Seizure disorder (HCC)   . Senile osteoporosis 01/31/2011  . Spontaneous ecchymoses 01/31/2011  . Squamous cell carcinoma of leg   . Stiffness of joints, not elsewhere classified, multiple sites 01/31/2011  . Unspecified essential hypertension 01/31/2011   Past Surgical History:  Procedure Laterality Date  . ABDOMINAL HYSTERECTOMY  06/21/2003   TAH/BSO, omenectomy PSB resect, Stg IC cystadenofibroma  . CHOLECYSTECTOMY  2005   Dr. Alethea Andes  . CYSTOSCOPY W/ URETERAL STENT PLACEMENT Right 02/13/2022   Procedure: CYSTOSCOPY WITH RETROGRADE PYELOGRAM/URETERAL STENT PLACEMENT, AND SUPRAPUBIC TUBE EXCHANGE;  Surgeon: Osborn Blaze, MD;  Location: [facility];  Service: Urology;  Laterality: Right;  . CYSTOSCOPY WITH RETROGRADE PYELOGRAM, URETEROSCOPY AND STENT PLACEMENT Right 03/06/2022   Procedure: CYSTOSCOPY WITH RETROGRADE PYELOGRAM, URETEROSCOPY AND STENT REPLACEMENT, SUPRAPUBIC CATHETER EXCHANGE;  Surgeon: Osborn Blaze, MD;  Location: [facility];  Service: Urology;  Laterality: Right;  . ELBOW SURGERY Right 2008   broken   Dr. Primus Brookes  . EYE SURGERY    . HOLMIUM LASER APPLICATION Right 03/06/2022   Procedure: HOLMIUM LASER APPLICATION;  Surgeon: Osborn Blaze, MD;  Location: [facility];  Service: Urology;  Laterality: Right;  . INTRAMEDULLARY (IM) NAIL INTERTROCHANTERIC Left 08/30/2021   Procedure: INTRAMEDULLARY (IM) NAIL INTERTROCHANTRIC;  Surgeon: Adonica Hoose, MD;  Location: [facility];  Service: Orthopedics;  Laterality: Left;  . ORIF PATELLA Left 05/02/2020   Procedure: OPEN REDUCTION INTERNAL (ORIF) FIXATION LEFT PATELLA WITH MEDIAL AND LATERAL LIGAMENT REINFORCEMENTS;  Surgeon: Saundra Curl, MD;  Location: [facility];  Service:  Orthopedics;  Laterality: Left;  . ORIF PATELLA Left 05/30/2020   Procedure: OPEN REDUCTION INTERNAL (ORIF) FIXATION PATELLA;  Surgeon: Saundra Curl, MD;  Location: [facility];  Service: Orthopedics;  Laterality: Left;  . RETINAL DETACHMENT SURGERY N/A    two  . REVERSE SHOULDER ARTHROPLASTY Left 05/06/2019   Procedure: REVERSE SHOULDER ARTHROPLASTY;  Surgeon: Ellard Gunning, MD;  Location: [facility];  Service: Orthopedics;  Laterality: Left;   . ROTATOR CUFF REPAIR Right 2012   Dr. Hazeline Lister  . SQUAMOUS CELL CARCINOMA EXCISION Bilateral 2012, 8/14   [place] on legs   Dr. Jerilynn Montenegro  . TONSILLECTOMY  1941  . VIDEO BRONCHOSCOPY WITH ENDOBRONCHIAL  NAVIGATION N/A 11/29/2015   Procedure: VIDEO BRONCHOSCOPY WITH ENDOBRONCHIAL NAVIGATION;  Surgeon: Denson Flake, MD;  Location: [facility];  Service: Thoracic;  Laterality: N/A;    Allergies  Allergen Reactions  . Vioxx [Rofecoxib] Shortness Of Breath  . Dyazide [Hydrochlorothiazide-Triamterene] Other (See Comments)    Lowers blood pressure too much  . Latex Swelling and Other (See Comments)    "Allergic," per [facility]  . Sulfa Antibiotics Nausea And Vomiting and Other (See Comments)    "Allergic," per MAR  . Sumycin [Tetracycline] Other (See Comments)    Can't take due to drug reaction- "Allergic," per [facility]    Outpatient Encounter Medications as of 07/29/2023  Medication Sig  . acetaminophen  (TYLENOL ) 325 MG tablet Take 650 mg by mouth every 6 (six) hours as needed for fever or mild pain. Give 2 tablet by mouth every 6 hours as needed for Pain  . acetic acid 0.25 % irrigation Irrigate with 1 Application as directed 2 (two) times daily.  . albuterol  (ACCUNEB ) 1.25 MG/3ML nebulizer solution Take 1 ampule by nebulization every 6 (six) hours as needed for shortness of breath.  . Albuterol  Sulfate (PROAIR  RESPICLICK) 108 (90 Base) MCG/ACT AEPB Inhale 2 puffs into the lungs daily as needed (shortness of breath or wheezing).  . Benzocaine-Menthol  (ORAJEL 2X  TOOTHACHE & GUM) 20-0.26 % GEL Use as directed 1 Dose in the mouth or throat every 6 (six) hours as needed (Tooth Pain).  . carvedilol  (COREG ) 3.125 MG tablet Take 1 tablet (3.125 mg total) by mouth 2 (two) times daily with a meal.  . docusate sodium  (COLACE) 100 MG capsule Take 100 mg by mouth as needed for mild constipation.  Aaron Aas ketoconazole (NIZORAL) 2 % shampoo Apply 1 Application topically 2 (two) times a week. Monday and Thursday  . mirtazapine  (REMERON ) 7.5 MG tablet Take 7.5 mg by mouth daily.  . Morphine  Sulfate, Concentrate, 5 MG/0.25ML SOLN Take 0.25 mLs by mouth every 8 (eight) hours as needed.  Aaron Aas NYAMYC powder Apply 1 Application topically daily as needed.  . nystatin cream (MYCOSTATIN) Apply 1 Application topically 2 (two) times daily.  . oxybutynin  (DITROPAN ) 5 MG tablet Take 5 mg by mouth 2 (two) times daily. (Patient not taking: Reported on 06/16/2023)  . potassium chloride  (MICRO-K ) 10 MEQ CR capsule Take 10 mEq by mouth daily.  . pramipexole  (MIRAPEX ) 0.25 MG tablet Take 0.25 mg by mouth daily. As needed for restless legs, see other bedtime listing  . pramipexole  (MIRAPEX ) 0.5 MG tablet Take 0.5 mg by mouth at bedtime. (Patient not taking: Reported on 06/16/2023)   No facility-administered encounter medications on file as of 07/29/2023.    Review of Systems  Constitutional:  Negative for appetite change, fatigue and fever.  HENT:  Positive for hearing loss. Negative for congestion and trouble swallowing.   Eyes:  Negative for visual disturbance.  Respiratory:  Negative for shortness of breath.        DOE is chronic  Cardiovascular:  Positive for leg swelling.  Gastrointestinal:  Negative for abdominal pain and constipation.       Hemorrhoids from previous examination.   Genitourinary:  Positive for difficulty urinating.       [facility]  Musculoskeletal:  Positive for arthralgias and gait problem.       S/p left hip ORIF, ORIF of the left patella. Positional pain in the left knee  sometimes.   Skin:  Negative for color change.       BLE discoloration, chronic venous insufficiency  skin changes   Neurological:  Negative for seizures, weakness and headaches.       Memory lapses. Hx of seizures. RLS  Psychiatric/Behavioral:  Negative for behavioral problems and sleep disturbance. The patient is not nervous/anxious.     Immunization History  Administered Date(s) Administered  . Fluzone Influenza virus vaccine,trivalent (IIV3), split virus 01/15/2017, 01/08/2018, 12/09/2018  . Influenza Split 01/06/2014  . Influenza Whole 01/07/2012, 01/06/2013  . Influenza, High Dose Seasonal PF 12/25/2015, 01/20/2017, 01/08/2023  . Influenza,inj,Quad PF,6+ Mos 12/21/2014  . Influenza-Unspecified 12/09/2018, 01/18/2020, 01/24/2021, 01/30/2022  . Moderna SARS-COV2 Booster Vaccination 04/02/2021  . Moderna Sars-Covid-2 Vaccination 04/12/2019, 06/05/2019, 02/15/2020, 09/05/2020  . PFIZER(Purple Top)SARS-COV-2 Vaccination 12/26/2020  . PPD Test 02/16/2022  . Pneumococcal Conjugate-13 02/01/2014  . Pneumococcal Polysaccharide-23 12/20/1992, 01/15/2000, 04/08/2004, 06/08/2004  . Rsv, Bivalent, Protein Subunit Rsvpref,pf (Abrysvo) 04/26/2022  . Td 04/08/2002, 04/22/2002  . Tdap 04/09/2011, 08/17/2015  . Unspecified SARS-COV-2 Vaccination 01/22/2023  . Zoster Recombinant(Shingrix) 05/29/2005, 08/27/2017, 06/13/2022  . Zoster, Live 04/08/2008, 05/29/2014, 05/28/2017  . Zoster, Unspecified 05/29/2005   Pertinent  Health Maintenance Due  Topic Date Due  . INFLUENZA VACCINE  11/07/2023  . DEXA SCAN  Completed  . MAMMOGRAM  Discontinued      02/15/2022    3:00 PM 02/15/2022    9:00 PM 02/16/2022    8:00 AM 03/06/2022    7:25 AM 05/20/2022    3:42 PM  Fall Risk  Falls in the past year?     0  Was there an injury with Fall?     0  Fall Risk Category Calculator     0  (RETIRED) Patient Fall Risk Level Low fall risk Moderate fall risk Moderate fall risk High fall risk   Patient at  Risk for Falls Due to     No Fall Risks  Fall risk Follow up     Falls evaluation completed   Functional Status Survey:    Vitals:   07/29/23 1034  BP: 110/73  Pulse: 85  Resp: 17  Temp: 97.9 F (36.6 C)  SpO2: 98%  Weight: 167 lb 6.4 oz (75.9 kg)  Height: 5\' 2"  (1.575 m)   Body mass index is 30.62 kg/m. Physical Exam Vitals and nursing note reviewed.  Constitutional:      Appearance: Normal appearance.  HENT:     Head: Normocephalic and atraumatic.     Nose: Nose normal.     Mouth/Throat:     Mouth: Mucous membranes are moist.     Pharynx: No posterior oropharyngeal erythema.  Eyes:     Extraocular Movements: Extraocular movements intact.     Conjunctiva/sclera: Conjunctivae normal.     Right eye: Right conjunctiva is not injected.     Left eye: Left conjunctiva is not injected.     Pupils: Pupils are equal, round, and reactive to light.  Neck:     Comments: Left adam's apple bony aspect, sometimes feels soreness on palpation is chronic, no noted lymph nodes  Cardiovascular:     Rate and Rhythm: Normal rate and regular rhythm.     Heart sounds: Murmur heard.     Comments: PD pulses are not felt from previous examination.  Pulmonary:     Effort: Pulmonary effort is normal.     Breath sounds: No rales.     Comments: Decreased air entry to both lungs.  Abdominal:     General: Bowel sounds are normal.     Palpations: Abdomen is soft.     Tenderness:  There is no abdominal tenderness.     Comments: Mid abd surgical scar. SPC  Genitourinary:    Comments: SPC. External hemorrhoids x2, no injury, bleeding, or pain  Musculoskeletal:     Cervical back: Normal range of motion and neck supple.     Right lower leg: No edema.     Left lower leg: No edema.     Comments: Decreased overhead ROM of the left shoulder. Left knee s/p ORIF of the patella fx.   Skin:    General: Skin is warm and dry.     Findings: Lesion present.     Comments: BLE discoloration, chronic venous  insufficiency skin changes. Left shin skin lesions-opted out workup or tx.      Neurological:     General: No focal deficit present.     Mental Status: She is alert. Mental status is at baseline.     Gait: Gait abnormal.  Psychiatric:        Mood and Affect: Mood normal.        Behavior: Behavior normal.    Labs reviewed: No results for input(s): "NA", "K", "CL", "CO2", "GLUCOSE", "BUN", "CREATININE", "CALCIUM", "MG", "PHOS" in the last 8760 hours. No results for input(s): "AST", "ALT", "ALKPHOS", "BILITOT", "PROT", "ALBUMIN " in the last 8760 hours. No results for input(s): "WBC", "NEUTROABS", "HGB", "HCT", "MCV", "PLT" in the last 8760 hours. Lab Results  Component Value Date   TSH 1.198 02/15/2022   No results found for: "HGBA1C" Lab Results  Component Value Date   CHOL 147 09/27/2021   HDL 48 09/27/2021   LDLCALC 78 09/27/2021   TRIG 131 09/27/2021   CHOLHDL 3.6 06/27/2015    Significant Diagnostic Results in last 30 days:  No results found.  Assessment/Plan Lower obstructive uropathy SPC, off Uribel and Oxybutynin .   Hypokalemia K 3.2 05/07/22, taking  Kcl  SVT (supraventricular tachycardia)  takes Coreg   Edema  mild,  off Furosemide .   Depression, psychotic (HCC)  stable, on Mirtazapine , off Lorazepam   Cognitive and behavioral changes under hospice service, prn Morphine  available to her.   Restless legs syndrome (RLS) taking MiraPex    Seizure (HCC)  no active seizures, off meds.   COPD (chronic obstructive pulmonary disease) (HCC) Stable, on ProAir , Albuterol  neb.      Family/ staff Communication: plan of care reviewed with the patient and charge nurse.   Labs/tests ordered:  none

## 2023-07-29 NOTE — Assessment & Plan Note (Signed)
 taking MiraPex 

## 2023-07-29 NOTE — Assessment & Plan Note (Signed)
 K 3.2 05/07/22, taking  Kcl

## 2023-07-29 NOTE — Assessment & Plan Note (Signed)
 no active seizures, off meds.

## 2023-07-29 NOTE — Assessment & Plan Note (Signed)
 mild,  off Furosemide .

## 2023-07-31 ENCOUNTER — Encounter: Payer: Self-pay | Admitting: Nurse Practitioner

## 2023-08-05 ENCOUNTER — Ambulatory Visit: Payer: Self-pay

## 2023-08-05 NOTE — Telephone Encounter (Signed)
  Chief Complaint: info only/medication clarification Additional Notes: [facility] calling for medication clarification/reorder. Please see below.   Copied from CRM [id number]. Topic: Clinical - Prescription Issue >> Aug 05, 2023  5:02 PM Adrianna P wrote: Reason for CRM: nystatin oral will not be able to be filled Reason for Disposition  [1] Caller requesting NON-URGENT health information AND [2] PCP's office is the best resource  Answer Assessment - Initial Assessment Questions 1. REASON FOR CALL or QUESTION: "What is your reason for calling today?" or "How can I best help you?" or "What question do you have that I can help answer?"     [facility] cannot fill medication that was just prescribed. Rph wants medication clarification and if MD wants to change Rx to nystatin suspension.  [facility] unable to find order in Epic, per Rph, order was continued from [facility] EMR so unknown if Rx will show in Epic. Rph provided Rx info: Nystatin oral tablet  SIG give 1 tablet PO for dental. Disolve in mouth 3x a day until gone.   Per Josiah Nigh, dissolvable oral tablet is no longer available on the market anymore. Tablets are available, but not to be dissolved. Tablets 500,000 mu = 5mL suspension    Jim [facility]  Protocols used: Information Only Call - No Triage-A-AH

## 2023-08-06 NOTE — Telephone Encounter (Signed)
Message routed to PCP Mast, Man X, NP

## 2023-08-25 ENCOUNTER — Encounter: Payer: Self-pay | Admitting: Nurse Practitioner

## 2023-08-25 ENCOUNTER — Non-Acute Institutional Stay (SKILLED_NURSING_FACILITY): Payer: Self-pay | Admitting: Nurse Practitioner

## 2023-08-25 DIAGNOSIS — G2581 Restless legs syndrome: Secondary | ICD-10-CM

## 2023-08-25 DIAGNOSIS — I471 Supraventricular tachycardia, unspecified: Secondary | ICD-10-CM

## 2023-08-25 DIAGNOSIS — R627 Adult failure to thrive: Secondary | ICD-10-CM

## 2023-08-25 DIAGNOSIS — R609 Edema, unspecified: Secondary | ICD-10-CM

## 2023-08-25 DIAGNOSIS — Z9359 Other cystostomy status: Secondary | ICD-10-CM

## 2023-08-25 DIAGNOSIS — F331 Major depressive disorder, recurrent, moderate: Secondary | ICD-10-CM

## 2023-08-25 DIAGNOSIS — F0390 Unspecified dementia without behavioral disturbance: Secondary | ICD-10-CM

## 2023-08-25 DIAGNOSIS — E876 Hypokalemia: Secondary | ICD-10-CM

## 2023-08-25 DIAGNOSIS — J439 Emphysema, unspecified: Secondary | ICD-10-CM

## 2023-08-25 DIAGNOSIS — I10 Essential (primary) hypertension: Secondary | ICD-10-CM

## 2023-08-25 NOTE — Assessment & Plan Note (Signed)
 K 3.2 05/07/22, taking  Kcl

## 2023-08-25 NOTE — Assessment & Plan Note (Signed)
 under Hospice service, weight stable.

## 2023-08-25 NOTE — Assessment & Plan Note (Signed)
Heart rate is in control,  takes Coreg 

## 2023-08-25 NOTE — Progress Notes (Signed)
 Location:   [facility] Room Number: 97 Place of Service:  SNF (31) Provider: Abner Hoffman Terez Freimark NP  Suraiya Dickerson X, NP  Patient Care Team: [place] Agostinelli X, NP as PCP - General (Internal Medicine) Maris Sickle, MD as Consulting Physician (Ophthalmology) Hugh Madura, MD as Consulting Physician (Cardiology) Avis Boehringer, MD as Consulting Physician (Dermatology) Hazle Lites, MD as Consulting Physician (Orthopedic Surgery) Genevie Kerns, MD (Inactive) as Consulting Physician (Orthopedic Surgery) Arvil Birks, MD as Consulting Physician (Orthopedic Surgery) [name] Faruq Rosenberger X, NP as Nurse Practitioner (Nurse Practitioner) Brian Campanile, MD (Inactive) as Consulting Physician (Neurology)  Extended Emergency Contact Information Primary Emergency Contact: [name],[name] Address: [address]          [address]          [place], [place] [address] [place] Home Phone: [phone] Relation: Sister Secondary Emergency Contact: [name],[name]  [place] Home Phone: [phone] Mobile Phone: [phone] Relation: Sister  Code Status:  DNR Goals of care: Advanced Directive information    07/29/2023   10:40 AM  Advanced Directives  Does Patient Have a Medical Advance Directive? Yes  Type of Advance Directive Out of facility DNR (pink MOST or yellow form)  Does patient want to make changes to medical advance directive? No - Patient declined  Pre-existing out of facility DNR order (yellow form or pink MOST form) Pink MOST/Yellow Form most recent copy in chart - Physician notified to receive inpatient order     Chief Complaint  Patient presents with   Medical Management of Chronic Issues    HPI:  Pt is a 89 y.o. female seen today for medical management of chronic diseases.     02/12/22 Obstructive nephritis, underwent cystoscopy with right retrograde pyelogram, right ureteral stent placed, f/u Urology Dr. Secundino Dach, 03/06/22  cystoscopy with retrograde pyelogram, ureteroscopy and stent replacement, SPC exchange.                05/03/22 abd US  no renal or gallbladder stone or hydronephrosis. 05/02/22 Xray abd may represent ileus. Hx of Ureteral stone with hydronephrosis per CT abd                          05/13/22 Urology: R ureteral stone, sp R ureteroscopy stone free 03/2022, stent. SPC  urethral stricture, off Oxybutynin . Hx of Ureteral stone with hydronephrosis per CT abd             External hemorrhoids, placed on Colace             Hypokalemia, K 3.2 05/07/22, taking  Kcl             Anemia, Hgb 11.4 05/07/22             SVT, takes Coreg              BLE edema, mild,  off Furosemide .              Adult failure to thrive, under Hospice service, weight stable.              Suprapubic Catheter, f/u Urology, off Uribel and Oxybutynin , Hx of urethral stricture.              Depression, stable, on Mirtazapine , off Lorazepam              Dementia, under hospice service, prn Morphine  available to her.  Restless leg syndrome,  taking MiraPex               Hx of seizures, no active seizures, off meds.              COPD, on ProAir , Albuterol  neb.   HTN, intermittent elevated Bp, asymptomatic, not on meds.     Past Medical History:  Diagnosis Date   Acute bronchitis 05/23/2011   Acute upper respiratory infections of unspecified site 05/23/2011   Arthritis    Chronic airway obstruction, not elsewhere classified 05/23/2011   Coronary artery disease    Disturbance of salivary secretion 01/31/2011   Dizziness and giddiness 01/31/2011   Dyspnea    Essential tremor 04/25/2014   External hemorrhoids without mention of complication 01/31/2011   Gait disorder 04/25/2014   GERD (gastroesophageal reflux disease)    History of kidney stones    Insomnia, unspecified 09/12/2011   Lumbago 01/31/2011   Major depressive disorder, single episode, unspecified 01/31/2011   Memory disorder 04/25/2014   Mitral valve  disorders(424.0) 01/31/2011   Other and unspecified hyperlipidemia 01/31/2011   Other convulsions 01/31/2011   Other emphysema (HCC) 01/31/2011   Pain in joint, site unspecified 01/31/2011   Restless legs syndrome (RLS) 09/12/2011   Retinal detachment with retinal defect of right eye 2011   right eye twice   Seizure disorder (HCC)    Senile osteoporosis 01/31/2011   Spontaneous ecchymoses 01/31/2011   Squamous cell carcinoma of leg    Stiffness of joints, not elsewhere classified, multiple sites 01/31/2011   Unspecified essential hypertension 01/31/2011   Past Surgical History:  Procedure Laterality Date   ABDOMINAL HYSTERECTOMY  06/21/2003   TAH/BSO, omenectomy PSB resect, Stg IC cystadenofibroma   CHOLECYSTECTOMY  2005   Dr. Alethea Andes   CYSTOSCOPY W/ URETERAL STENT PLACEMENT Right 02/13/2022   Procedure: CYSTOSCOPY WITH RETROGRADE PYELOGRAM/URETERAL STENT PLACEMENT, AND SUPRAPUBIC TUBE EXCHANGE;  Surgeon: Osborn Blaze, MD;  Location: [facility];  Service: Urology;  Laterality: Right;   CYSTOSCOPY WITH RETROGRADE PYELOGRAM, URETEROSCOPY AND STENT PLACEMENT Right 03/06/2022   Procedure: CYSTOSCOPY WITH RETROGRADE PYELOGRAM, URETEROSCOPY AND STENT REPLACEMENT, SUPRAPUBIC CATHETER EXCHANGE;  Surgeon: Osborn Blaze, MD;  Location: [facility];  Service: Urology;  Laterality: Right;   ELBOW SURGERY Right 2008   broken   Dr. Primus Brookes   EYE SURGERY     HOLMIUM LASER APPLICATION Right 03/06/2022   Procedure: HOLMIUM LASER APPLICATION;  Surgeon: Osborn Blaze, MD;  Location: [facility];  Service: Urology;  Laterality: Right;   INTRAMEDULLARY (IM) NAIL INTERTROCHANTERIC Left 08/30/2021   Procedure: INTRAMEDULLARY (IM) NAIL INTERTROCHANTRIC;  Surgeon: Adonica Hoose, MD;  Location: [facility];  Service: Orthopedics;  Laterality: Left;   ORIF PATELLA Left 05/02/2020   Procedure: OPEN REDUCTION INTERNAL (ORIF) FIXATION LEFT PATELLA WITH MEDIAL AND LATERAL LIGAMENT REINFORCEMENTS;  Surgeon: Saundra Curl, MD;   Location: [facility];  Service: Orthopedics;  Laterality: Left;   ORIF PATELLA Left 05/30/2020   Procedure: OPEN REDUCTION INTERNAL (ORIF) FIXATION PATELLA;  Surgeon: Saundra Curl, MD;  Location: [facility];  Service: Orthopedics;  Laterality: Left;   RETINAL DETACHMENT SURGERY N/A    two   REVERSE SHOULDER ARTHROPLASTY Left 05/06/2019   Procedure: REVERSE SHOULDER ARTHROPLASTY;  Surgeon: Ellard Gunning, MD;  Location: [facility];  Service: Orthopedics;  Laterality: Left;    ROTATOR CUFF REPAIR Right 2012   Dr. Hazeline Lister   SQUAMOUS CELL CARCINOMA EXCISION Bilateral 2012, 8/14   [place] on legs   Dr. Jerilynn Montenegro   TONSILLECTOMY  1941   VIDEO BRONCHOSCOPY WITH ENDOBRONCHIAL NAVIGATION N/A 11/29/2015   Procedure: VIDEO BRONCHOSCOPY WITH ENDOBRONCHIAL NAVIGATION;  Surgeon: Denson Flake, MD;  Location: [facility];  Service: Thoracic;  Laterality: N/A;    Allergies  Allergen Reactions   Vioxx [Rofecoxib] Shortness Of Breath   Dyazide [Hydrochlorothiazide-Triamterene] Other (See Comments)    Lowers blood pressure too much   Latex Swelling and Other (See Comments)    "Allergic," per [facility]   Sulfa Antibiotics Nausea And Vomiting and Other (See Comments)    "Allergic," per [facility]   Sumycin [Tetracycline] Other (See Comments)    Can't take due to drug reaction- "Allergic," per [facility]    Allergies as of 08/25/2023       Reactions   Vioxx [rofecoxib] Shortness Of Breath   Dyazide [hydrochlorothiazide-triamterene] Other (See Comments)   Lowers blood pressure too much   Latex Swelling, Other (See Comments)   "Allergic," per [facility]   Sulfa Antibiotics Nausea And Vomiting, Other (See Comments)   "Allergic," per [facility]   Sumycin [tetracycline] Other (See Comments)   Can't take due to drug reaction- "Allergic," per [facility]        Medication List        Accurate as of Aug 25, 2023 11:59 PM. If you have any questions, ask your nurse or doctor.          acetaminophen  325 MG tablet Commonly known as: TYLENOL  Take  650 mg by mouth every 6 (six) hours as needed for fever or mild pain. Give 2 tablet by mouth every 6 hours as needed for Pain   acetic acid 0.25 % irrigation Irrigate with 1 Application as directed 2 (two) times daily.   carvedilol  3.125 MG tablet Commonly known as: COREG  Take 1 tablet (3.125 mg total) by mouth 2 (two) times daily with a meal.   docusate sodium  100 MG capsule Commonly known as: COLACE Take 100 mg by mouth as needed for mild constipation.   ketoconazole 2 % shampoo Commonly known as: NIZORAL Apply 1 Application topically 2 (two) times a week. Monday and Thursday   mirtazapine  7.5 MG tablet Commonly known as: REMERON  Take 7.5 mg by mouth daily.   Morphine  Sulfate (Concentrate) 5 MG/0.25ML Soln Take 0.25 mLs by mouth every 8 (eight) hours as needed.   Nyamyc powder Generic drug: nystatin Apply 1 Application topically daily as needed.   nystatin cream Commonly known as: MYCOSTATIN Apply 1 Application topically 2 (two) times daily.   Orajel 2X Toothache & Gum 20-0.26 % Gel Generic drug: Benzocaine-Menthol  Use as directed 1 Dose in the mouth or throat every 6 (six) hours as needed (Tooth Pain).   oxybutynin  5 MG tablet Commonly known as: DITROPAN  Take 5 mg by mouth 2 (two) times daily.   potassium chloride  10 MEQ CR capsule Commonly known as: MICRO-K  Take 10 mEq by mouth daily.   pramipexole  0.5 MG tablet Commonly known as: MIRAPEX  Take 0.5 mg by mouth at bedtime.   pramipexole  0.25 MG tablet Commonly known as: MIRAPEX  Take 0.25 mg by mouth daily. As needed for restless legs, see other bedtime listing   ProAir  RespiClick 108 (90 Base) MCG/ACT Aepb Generic drug: Albuterol  Sulfate Inhale 2 puffs into the lungs daily as needed (shortness of breath or wheezing).   albuterol  1.25 MG/3ML nebulizer solution Commonly known as: ACCUNEB  Take 1 ampule by nebulization every 6 (six) hours as needed for shortness of breath.        Review of Systems   Constitutional:  Negative for appetite change, fatigue and fever.  HENT:  Positive for hearing loss. Negative for congestion and trouble swallowing.   Eyes:  Negative for visual disturbance.  Respiratory:  Negative for shortness of breath.        DOE is chronic  Cardiovascular:  Positive for leg swelling.  Gastrointestinal:  Negative for abdominal pain and constipation.       Hemorrhoids from previous examination.   Genitourinary:  Positive for difficulty urinating.       [facility]  Musculoskeletal:  Positive for arthralgias and gait problem.       S/p left hip ORIF, ORIF of the left patella. Positional pain in the left knee sometimes.   Skin:  Negative for color change.       BLE discoloration, chronic venous insufficiency skin changes   Neurological:  Negative for seizures, weakness and headaches.       Memory lapses. Hx of seizures. RLS  Psychiatric/Behavioral:  Negative for behavioral problems and sleep disturbance. The patient is not nervous/anxious.     Immunization History  Administered Date(s) Administered   Fluzone Influenza virus vaccine,trivalent (IIV3), split virus 01/15/2017, 01/08/2018, 12/09/2018   Influenza Split 01/06/2014   Influenza Whole 01/07/2012, 01/06/2013   Influenza, High Dose Seasonal PF 12/25/2015, 01/20/2017, 01/08/2023   Influenza,inj,Quad PF,6+ Mos 12/21/2014   Influenza-Unspecified 12/09/2018, 01/18/2020, 01/24/2021, 01/30/2022   Moderna SARS-COV2 Booster Vaccination 04/02/2021   Moderna Sars-Covid-2 Vaccination 04/12/2019, 06/05/2019, 02/15/2020, 09/05/2020   PFIZER(Purple Top)SARS-COV-2 Vaccination 12/26/2020   PPD Test 02/16/2022   Pneumococcal Conjugate-13 02/01/2014   Pneumococcal Polysaccharide-23 12/20/1992, 01/15/2000, 04/08/2004, 06/08/2004   Rsv, Bivalent, Protein Subunit Rsvpref,pf Pattricia Bores) 04/26/2022   Td 04/08/2002, 04/22/2002   Tdap 04/09/2011, 08/17/2015   Unspecified SARS-COV-2 Vaccination 01/22/2023   Zoster Recombinant(Shingrix)  05/29/2005, 08/27/2017, 06/13/2022   Zoster, Live 04/08/2008, 05/29/2014, 05/28/2017   Zoster, Unspecified 05/29/2005   Pertinent  Health Maintenance Due  Topic Date Due   INFLUENZA VACCINE  11/07/2023   DEXA SCAN  Completed   MAMMOGRAM  Discontinued      02/15/2022    3:00 PM 02/15/2022    9:00 PM 02/16/2022    8:00 AM 03/06/2022    7:25 AM 05/20/2022    3:42 PM  Fall Risk  Falls in the past year?     0  Was there an injury with Fall?     0  Fall Risk Category Calculator     0  (RETIRED) Patient Fall Risk Level Low fall risk Moderate fall risk Moderate fall risk High fall risk   Patient at Risk for Falls Due to     No Fall Risks  Fall risk Follow up     Falls evaluation completed   Functional Status Survey:    Vitals:   08/25/23 1338 08/28/23 1510  BP: (!) 140/84 (!) 142/84  Pulse: 64   Resp: 20   Temp: 98 F (36.7 C)   SpO2: 93%   Weight: 168 lb 1.6 oz (76.2 kg)    Body mass index is 30.75 kg/m. Physical Exam Vitals and nursing note reviewed.  Constitutional:      Appearance: Normal appearance.  HENT:     Head: Normocephalic and atraumatic.     Nose: Nose normal.     Mouth/Throat:     Mouth: Mucous membranes are moist.     Pharynx: No posterior oropharyngeal erythema.  Eyes:     Extraocular Movements: Extraocular movements intact.     Conjunctiva/sclera: Conjunctivae normal.     Right eye:  Right conjunctiva is not injected.     Left eye: Left conjunctiva is not injected.     Pupils: Pupils are equal, round, and reactive to light.  Neck:     Comments: Left adam's apple bony aspect, sometimes feels soreness on palpation is chronic, no noted lymph nodes  Cardiovascular:     Rate and Rhythm: Normal rate and regular rhythm.     Heart sounds: Murmur heard.     Comments: PD pulses are not felt from previous examination.  Pulmonary:     Effort: Pulmonary effort is normal.     Breath sounds: No rales.     Comments: Decreased air entry to both lungs.   Abdominal:     General: Bowel sounds are normal.     Palpations: Abdomen is soft.     Tenderness: There is no abdominal tenderness.     Comments: Mid abd surgical scar. SPC  Genitourinary:    Comments: SPC. External hemorrhoids x2, no injury, bleeding, or pain  Musculoskeletal:     Cervical back: Normal range of motion and neck supple.     Right lower leg: Edema present.     Left lower leg: Edema present.     Comments: Decreased overhead ROM of the left shoulder. Left knee s/p ORIF of the patella fx.  Trace edema BLE is chronic  Skin:    General: Skin is warm and dry.     Findings: Lesion present.     Comments: BLE discoloration, chronic venous insufficiency skin changes. Left shin skin lesions-opted out workup or tx.      Neurological:     General: No focal deficit present.     Mental Status: She is alert. Mental status is at baseline.     Gait: Gait abnormal.  Psychiatric:        Mood and Affect: Mood normal.        Behavior: Behavior normal.     Labs reviewed: No results for input(s): "NA", "K", "CL", "CO2", "GLUCOSE", "BUN", "CREATININE", "CALCIUM", "MG", "PHOS" in the last 8760 hours. No results for input(s): "AST", "ALT", "ALKPHOS", "BILITOT", "PROT", "ALBUMIN " in the last 8760 hours. No results for input(s): "WBC", "NEUTROABS", "HGB", "HCT", "MCV", "PLT" in the last 8760 hours. Lab Results  Component Value Date   TSH 1.198 02/15/2022   No results found for: "HGBA1C" Lab Results  Component Value Date   CHOL 147 09/27/2021   HDL 48 09/27/2021   LDLCALC 78 09/27/2021   TRIG 131 09/27/2021   CHOLHDL 3.6 06/27/2015    Significant Diagnostic Results in last 30 days:  No results found.  Assessment/Plan  Hypokalemia K 3.2 05/07/22, taking  Kcl  SVT (supraventricular tachycardia) Heart rate is in control, takes Coreg   Edema  mild,  off Furosemide .   Adult failure to thrive under Hospice service, weight stable.   Suprapubic catheter [facility]) f/u Urology,  off Uribel and Oxybutynin , hx of urethral stricture.   Major depressive disorder, recurrent (HCC) stable, on Mirtazapine , off Lorazepam   Senile dementia (HCC) Stable, continue SNF [facility] for care, under hospice service, prn Morphine  available to her.   Restless legs syndrome (RLS) Managed, taking MiraPex    COPD (chronic obstructive pulmonary disease) (HCC) Stable, on ProAir , Albuterol  neb.     Essential hypertension  intermittent elevated Bp, asymptomatic, not on meds.    Family/ staff Communication: plan of care reviewed with the patient and charge nurse.   Labs/tests ordered:  none

## 2023-08-25 NOTE — Assessment & Plan Note (Signed)
 stable, on Mirtazapine , off Lorazepam 

## 2023-08-25 NOTE — Assessment & Plan Note (Signed)
 Managed, taking MiraPex 

## 2023-08-25 NOTE — Assessment & Plan Note (Addendum)
 f/u Urology, off Uribel and Oxybutynin , hx of urethral stricture.

## 2023-08-25 NOTE — Assessment & Plan Note (Signed)
 Stable, continue SNF [facility] for care, under hospice service, prn Morphine  available to her.

## 2023-08-25 NOTE — Assessment & Plan Note (Signed)
 mild,  off Furosemide .

## 2023-08-25 NOTE — Assessment & Plan Note (Signed)
 Stable, on ProAir, Albuterol neb.

## 2023-08-29 NOTE — Assessment & Plan Note (Signed)
 intermittent elevated Bp, asymptomatic, not on meds.

## 2023-09-17 DIAGNOSIS — F331 Major depressive disorder, recurrent, moderate: Secondary | ICD-10-CM | POA: Diagnosis not present

## 2023-09-17 DIAGNOSIS — F411 Generalized anxiety disorder: Secondary | ICD-10-CM | POA: Diagnosis not present

## 2023-09-21 ENCOUNTER — Telehealth: Payer: Self-pay | Admitting: Internal Medicine

## 2023-09-21 NOTE — Telephone Encounter (Signed)
 Patient Having Cough and feeling weak Covid Flu negative Chest xray ordered  Possible Left Lower lobe infiltrate Started on Levaquin  500 mg every day for 7 days Follow up on Mon

## 2023-09-22 ENCOUNTER — Encounter: Payer: Self-pay | Admitting: Sports Medicine

## 2023-09-22 ENCOUNTER — Non-Acute Institutional Stay (SKILLED_NURSING_FACILITY): Payer: Self-pay | Admitting: Sports Medicine

## 2023-09-22 DIAGNOSIS — F0390 Unspecified dementia without behavioral disturbance: Secondary | ICD-10-CM | POA: Diagnosis not present

## 2023-09-22 DIAGNOSIS — J189 Pneumonia, unspecified organism: Secondary | ICD-10-CM | POA: Diagnosis not present

## 2023-09-22 DIAGNOSIS — F5101 Primary insomnia: Secondary | ICD-10-CM

## 2023-09-22 DIAGNOSIS — K5900 Constipation, unspecified: Secondary | ICD-10-CM | POA: Diagnosis not present

## 2023-09-22 DIAGNOSIS — Z515 Encounter for palliative care: Secondary | ICD-10-CM | POA: Diagnosis not present

## 2023-09-22 NOTE — Progress Notes (Signed)
 Location:   [name] Room Number: 29-A Place of Service:  SNF (31) Provider:  Fredy Jericho  PCP: Mast, Man X, NP  Patient Care Team: Mast, Man X, NP as PCP - General (Internal Medicine) Maris Sickle, MD as Consulting Physician (Ophthalmology) Hugh Madura, MD as Consulting Physician (Cardiology) Avis Boehringer, MD as Consulting Physician (Dermatology) Hazle Lites, MD as Consulting Physician (Orthopedic Surgery) Genevie Kerns, MD (Inactive) as Consulting Physician (Orthopedic Surgery) Arvil Birks, MD as Consulting Physician (Orthopedic Surgery) [name] Mast, Man X, NP as Nurse Practitioner (Nurse Practitioner) Brian Campanile, MD (Inactive) as Consulting Physician (Neurology)  Extended Emergency Contact Information Primary Emergency Contact: [name],[name] Address: [address]          [address]          [place], [place] [address] [place] Home Phone: [phone] Relation: Sister Secondary Emergency Contact: [name],[name]  [place] Home Phone: [phone] Mobile Phone: [phone] Relation: Sister  Code Status:  DNR Goals of care: Advanced Directive information    09/22/2023    1:46 PM  Advanced Directives  Does Patient Have a Medical Advance Directive? Yes  Type of Advance Directive Out of facility DNR (pink MOST or yellow form)  Does patient want to make changes to medical advance directive? No - Patient declined     Chief Complaint  Patient presents with   Pneumonia    HPI:  Pt is a 89 y.o. female with past medical history of dementia, hypokalemia, anemia, SVT, bilateral lower extremity edema, depression, suprapubic catheter, restless leg syndrome, COPD, hypertension is seen today for an acute visit for pneumonia. Patient seen and examined in the living room.  She is sitting in a wheelchair.  Seems pleasant and comfortable and does not appear to be in distress. She  is able to speak in full sentences   Patient complains of productive cough with whitish phlegm since few days and mild congestion.  She is afebrile.  Her flu and COVID test came back negative chest x-ray on the weekend showed left lower lobe and she was started on Levaquin . Patient reports good appetite denies shortness of breath, chest pain, abdominal pain, nausea, vomiting. No agitation or concerns per nursing staff.  Past Medical History:  Diagnosis Date   Acute bronchitis 05/23/2011   Acute upper respiratory infections of unspecified site 05/23/2011   Arthritis    Chronic airway obstruction, not elsewhere classified 05/23/2011   Coronary artery disease    Disturbance of salivary secretion 01/31/2011   Dizziness and giddiness 01/31/2011   Dyspnea    Essential tremor 04/25/2014   External hemorrhoids without mention of complication 01/31/2011   Gait disorder 04/25/2014   GERD (gastroesophageal reflux disease)    History of kidney stones    Insomnia, unspecified 09/12/2011   Lumbago 01/31/2011   Major depressive disorder, single episode, unspecified 01/31/2011   Memory disorder 04/25/2014   Mitral valve disorders(424.0) 01/31/2011   Other and unspecified hyperlipidemia 01/31/2011   Other convulsions 01/31/2011   Other emphysema (HCC) 01/31/2011   Pain in joint, site unspecified 01/31/2011   Restless legs syndrome (RLS) 09/12/2011   Retinal detachment with retinal defect of right eye 2011   right eye twice   Seizure disorder (HCC)    Senile osteoporosis 01/31/2011   Spontaneous ecchymoses 01/31/2011   Squamous cell carcinoma of leg    Stiffness of joints, not elsewhere classified, multiple sites 01/31/2011   Unspecified essential hypertension 01/31/2011  Past Surgical History:  Procedure Laterality Date   ABDOMINAL HYSTERECTOMY  06/21/2003   TAH/BSO, omenectomy PSB resect, Stg IC cystadenofibroma   CHOLECYSTECTOMY  2005   Dr. Alethea Andes   CYSTOSCOPY W/ URETERAL STENT PLACEMENT  Right 02/13/2022   Procedure: CYSTOSCOPY WITH RETROGRADE PYELOGRAM/URETERAL STENT PLACEMENT, AND SUPRAPUBIC TUBE EXCHANGE;  Surgeon: Osborn Blaze, MD;  Location: [facility];  Service: Urology;  Laterality: Right;   CYSTOSCOPY WITH RETROGRADE PYELOGRAM, URETEROSCOPY AND STENT PLACEMENT Right 03/06/2022   Procedure: CYSTOSCOPY WITH RETROGRADE PYELOGRAM, URETEROSCOPY AND STENT REPLACEMENT, SUPRAPUBIC CATHETER EXCHANGE;  Surgeon: Osborn Blaze, MD;  Location: [facility];  Service: Urology;  Laterality: Right;   ELBOW SURGERY Right 2008   broken   Dr. Primus Brookes   EYE SURGERY     HOLMIUM LASER APPLICATION Right 03/06/2022   Procedure: HOLMIUM LASER APPLICATION;  Surgeon: Osborn Blaze, MD;  Location: [facility];  Service: Urology;  Laterality: Right;   INTRAMEDULLARY (IM) NAIL INTERTROCHANTERIC Left 08/30/2021   Procedure: INTRAMEDULLARY (IM) NAIL INTERTROCHANTRIC;  Surgeon: Adonica Hoose, MD;  Location: [facility];  Service: Orthopedics;  Laterality: Left;   ORIF PATELLA Left 05/02/2020   Procedure: OPEN REDUCTION INTERNAL (ORIF) FIXATION LEFT PATELLA WITH MEDIAL AND LATERAL LIGAMENT REINFORCEMENTS;  Surgeon: Saundra Curl, MD;  Location: [facility];  Service: Orthopedics;  Laterality: Left;   ORIF PATELLA Left 05/30/2020   Procedure: OPEN REDUCTION INTERNAL (ORIF) FIXATION PATELLA;  Surgeon: Saundra Curl, MD;  Location: [facility];  Service: Orthopedics;  Laterality: Left;   RETINAL DETACHMENT SURGERY N/A    two   REVERSE SHOULDER ARTHROPLASTY Left 05/06/2019   Procedure: REVERSE SHOULDER ARTHROPLASTY;  Surgeon: Ellard Gunning, MD;  Location: [facility];  Service: Orthopedics;  Laterality: Left;    ROTATOR CUFF REPAIR Right 2012   Dr. Hazeline Lister   SQUAMOUS CELL CARCINOMA EXCISION Bilateral 2012, 8/14   [place] on legs   Dr. Jerilynn Montenegro   TONSILLECTOMY  1941   VIDEO BRONCHOSCOPY WITH ENDOBRONCHIAL NAVIGATION N/A 11/29/2015   Procedure: VIDEO BRONCHOSCOPY WITH ENDOBRONCHIAL NAVIGATION;  Surgeon: Denson Flake, MD;   Location: [facility];  Service: Thoracic;  Laterality: N/A;    Allergies  Allergen Reactions   Vioxx [Rofecoxib] Shortness Of Breath   Dyazide [Hydrochlorothiazide-Triamterene] Other (See Comments)    Lowers blood pressure too much   Latex Swelling and Other (See Comments)    Allergic, per [facility]   Sulfa Antibiotics Nausea And Vomiting and Other (See Comments)    Allergic, per [facility]   Sumycin [Tetracycline] Other (See Comments)    Can't take due to drug reaction- Allergic, per [facility]    Allergies as of 09/22/2023       Reactions   Vioxx [rofecoxib] Shortness Of Breath   Dyazide [hydrochlorothiazide-triamterene] Other (See Comments)   Lowers blood pressure too much   Latex Swelling, Other (See Comments)   Allergic, per [facility]   Sulfa Antibiotics Nausea And Vomiting, Other (See Comments)   Allergic, per [facility]   Sumycin [tetracycline] Other (See Comments)   Can't take due to drug reaction- Allergic, per [facility]        Medication List        Accurate as of September 22, 2023  1:46 PM. If you have any questions, ask your nurse or doctor.          acetaminophen  325 MG tablet Commonly known as: TYLENOL  Take 650 mg by mouth every 6 (six) hours as needed for fever or mild pain. Give 2 tablet by mouth every 6 hours as  needed for Pain   acetic acid 0.25 % irrigation Irrigate with 1 Application as directed 2 (two) times daily.   carvedilol  3.125 MG tablet Commonly known as: COREG  Take 1 tablet (3.125 mg total) by mouth 2 (two) times daily with a meal.   dextromethorphan-guaiFENesin 30-600 MG 12hr tablet Commonly known as: MUCINEX DM Take 1 tablet by mouth 2 (two) times daily.   docusate sodium  100 MG capsule Commonly known as: COLACE Take 100 mg by mouth as needed for mild constipation.   ipratropium-albuterol  0.5-2.5 (3) MG/3ML Soln Commonly known as: DUONEB Take 3 mLs by nebulization as directed. Inhale orally one time only for cough/wheezing for 1 day   ketoconazole 2 %  shampoo Commonly known as: NIZORAL Apply 1 Application topically 2 (two) times a week. Monday and Thursday   levofloxacin  500 MG tablet Commonly known as: LEVAQUIN  Take 500 mg by mouth daily.   mirtazapine  7.5 MG tablet Commonly known as: REMERON  Take 7.5 mg by mouth daily.   mirtazapine  15 MG tablet Commonly known as: REMERON  Take 15 mg by mouth daily.   Morphine  Sulfate (Concentrate) 5 MG/0.25ML Soln Take 0.25 mLs by mouth every 8 (eight) hours as needed.   Nyamyc powder Generic drug: nystatin Apply 1 Application topically daily as needed.   nystatin cream Commonly known as: MYCOSTATIN Apply 1 Application topically as needed.   Orajel 2X Toothache & Gum 20-0.26 % Gel Generic drug: Benzocaine-Menthol  Use as directed 1 Dose in the mouth or throat every 6 (six) hours as needed (Tooth Pain).   oxybutynin  5 MG tablet Commonly known as: DITROPAN  Take 5 mg by mouth 2 (two) times daily.   potassium chloride  10 MEQ CR capsule Commonly known as: MICRO-K  Take 10 mEq by mouth daily.   pramipexole  0.5 MG tablet Commonly known as: MIRAPEX  Take 0.5 mg by mouth at bedtime.   pramipexole  0.25 MG tablet Commonly known as: MIRAPEX  Take 0.25 mg by mouth daily. As needed for restless legs, see other bedtime listing   ProAir  RespiClick 108 (90 Base) MCG/ACT Aepb Generic drug: Albuterol  Sulfate Inhale 2 puffs into the lungs daily as needed (shortness of breath or wheezing).   albuterol  1.25 MG/3ML nebulizer solution Commonly known as: ACCUNEB  Take 1 ampule by nebulization 2 (two) times daily.   albuterol  1.25 MG/3ML nebulizer solution Commonly known as: ACCUNEB  Take 1 ampule by nebulization every 8 (eight) hours as needed for shortness of breath.        Review of Systems  Constitutional:  Negative for chills and fever.  HENT:  Negative for sore throat.   Respiratory:  Positive for cough. Negative for shortness of breath and wheezing.   Cardiovascular:  Negative for chest  pain, palpitations and leg swelling.  Gastrointestinal:  Negative for abdominal distention, abdominal pain, blood in stool, constipation, diarrhea, nausea and vomiting.  Genitourinary:  Negative for dysuria.  Neurological:  Negative for dizziness.    Immunization History  Administered Date(s) Administered   Fluzone Influenza virus vaccine,trivalent (IIV3), split virus 01/15/2017, 01/08/2018, 12/09/2018   Influenza Split 01/06/2014   Influenza Whole 01/07/2012, 01/06/2013   Influenza, High Dose Seasonal PF 12/25/2015, 01/20/2017, 01/08/2023   Influenza,inj,Quad PF,6+ Mos 12/21/2014   Influenza-Unspecified 12/09/2018, 01/18/2020, 01/24/2021, 01/30/2022   Moderna SARS-COV2 Booster Vaccination 04/02/2021   Moderna Sars-Covid-2 Vaccination 04/12/2019, 06/05/2019, 02/15/2020, 09/05/2020   PFIZER(Purple Top)SARS-COV-2 Vaccination 12/26/2020   PPD Test 02/16/2022   Pneumococcal Conjugate-13 02/01/2014   Pneumococcal Polysaccharide-23 12/20/1992, 01/15/2000, 04/08/2004, 06/08/2004   Rsv, Bivalent, Protein Subunit Rsvpref,pf Pattricia Bores)  04/26/2022   Td 04/08/2002, 04/22/2002   Tdap 04/09/2011, 08/17/2015   Unspecified SARS-COV-2 Vaccination 01/22/2023   Zoster Recombinant(Shingrix) 05/29/2005, 08/27/2017, 06/13/2022   Zoster, Live 04/08/2008, 05/29/2014, 05/28/2017   Zoster, Unspecified 05/29/2005   Pertinent  Health Maintenance Due  Topic Date Due   INFLUENZA VACCINE  11/07/2023   DEXA SCAN  Completed   MAMMOGRAM  Discontinued      02/15/2022    3:00 PM 02/15/2022    9:00 PM 02/16/2022    8:00 AM 03/06/2022    7:25 AM 05/20/2022    3:42 PM  Fall Risk  Falls in the past year?     0  Was there an injury with Fall?     0  Fall Risk Category Calculator     0  (RETIRED) Patient Fall Risk Level Low fall risk  Moderate fall risk  Moderate fall risk  High fall risk    Patient at Risk for Falls Due to     No Fall Risks  Fall risk Follow up     Falls evaluation completed     Data saved  with a previous flowsheet row definition   Functional Status Survey:    Vitals:   09/22/23 1241  BP: 129/71  Pulse: 86  Resp: 18  Temp: (!) 97.2 F (36.2 C)  SpO2: (!) 87%  Weight: 172 lb 3.2 oz (78.1 kg)  Height: 5' 2 (1.575 m)   Body mass index is 31.5 kg/m. Physical Exam Constitutional:      Appearance: Normal appearance.  HENT:     Head: Normocephalic and atraumatic.   Cardiovascular:     Rate and Rhythm: Normal rate and regular rhythm.  Pulmonary:     Effort: Pulmonary effort is normal. No respiratory distress.     Breath sounds: Normal breath sounds. No wheezing.  Abdominal:     General: Bowel sounds are normal. There is no distension.     Tenderness: There is no abdominal tenderness. There is no guarding or rebound.     Comments:     Musculoskeletal:        General: No swelling.   Neurological:     Mental Status: She is alert. Mental status is at baseline.     Motor: No weakness.     Labs reviewed: No results for input(s): NA, K, CL, CO2, GLUCOSE, BUN, CREATININE, CALCIUM, MG, PHOS in the last 8760 hours. No results for input(s): AST, ALT, ALKPHOS, BILITOT, PROT, ALBUMIN  in the last 8760 hours. No results for input(s): WBC, NEUTROABS, HGB, HCT, MCV, PLT in the last 8760 hours. Lab Results  Component Value Date   TSH 1.198 02/15/2022   No results found for: HGBA1C Lab Results  Component Value Date   CHOL 147 09/27/2021   HDL 48 09/27/2021   LDLCALC 78 09/27/2021   TRIG 131 09/27/2021   CHOLHDL 3.6 06/27/2015    Significant Diagnostic Results in last 30 days:  No results found.  Assessment/Plan Left lower lobe pneumonia Patient complains of productive cough with whitish phlegm Denies shortness of breath Vital stable She is saturating 92% on room air She is able to speak in full sentences and does not appear to be in distress. Continue with Mucinex for the cough Monitor vitals every  shift   Neurocognitive disorder No behavioral problems reported by nursing staff Continue assisting with ADLs Monitor for delirium  Hospice care patient Patient seems pleasant and comfortable Does not appear to be in pain Continue the morphine  as needed  Constipation Continue with Colace  Insomnia Continue with Remeron .   30 min Total time spent for obtaining history,  performing a medically appropriate examination and evaluation, reviewing the tests,   documenting clinical information in the electronic or other health record,  ,care coordination (not separately reported)

## 2023-10-02 ENCOUNTER — Non-Acute Institutional Stay (SKILLED_NURSING_FACILITY): Payer: Self-pay | Admitting: Sports Medicine

## 2023-10-02 DIAGNOSIS — J449 Chronic obstructive pulmonary disease, unspecified: Secondary | ICD-10-CM | POA: Diagnosis not present

## 2023-10-02 DIAGNOSIS — F5101 Primary insomnia: Secondary | ICD-10-CM

## 2023-10-02 DIAGNOSIS — Z515 Encounter for palliative care: Secondary | ICD-10-CM

## 2023-10-02 DIAGNOSIS — G2581 Restless legs syndrome: Secondary | ICD-10-CM

## 2023-10-02 DIAGNOSIS — F0390 Unspecified dementia without behavioral disturbance: Secondary | ICD-10-CM

## 2023-10-02 DIAGNOSIS — I471 Supraventricular tachycardia, unspecified: Secondary | ICD-10-CM

## 2023-10-02 NOTE — Progress Notes (Addendum)
 Location:  [name] Room Number: 13 A Place of Service:  SNF (31) Provider: Sherlynn Madden MD  Mast, Man X, NP  Patient Care Team: Mast, Man X, NP as PCP - General (Internal Medicine) [name], MD as Consulting Physician (Ophthalmology) [name], MD as Consulting Physician (Cardiology) [name], MD as Consulting Physician (Dermatology) [name], MD as Consulting Physician (Orthopedic Surgery) [name], MD (Inactive) as Consulting Physician (Orthopedic Surgery) [name], MD as Consulting Physician (Orthopedic Surgery) [name] Mast, Man X, NP as Nurse Practitioner (Nurse Practitioner) [name], MD (Inactive) as Consulting Physician (Neurology)  Extended Emergency Contact Information Primary Emergency Contact: [name],[name] Address: [address]          [address]          [place], [place] [address] [place] Home Phone: [phone] Relation: Sister Secondary Emergency Contact: Arthur Randall [place] Home Phone: [phone] Mobile Phone: [phone] Relation: Sister  Code Status:  DNR Goals of care: Advanced Directive information    09/22/2023    1:46 PM  Advanced Directives  Does Patient Have a Medical Advance Directive? Yes  Type of Advance Directive Out of facility DNR (pink MOST or yellow form)  Does patient want to make changes to medical advance directive? No - Patient declined                                 Hospice related visit  Chief Complaint  Patient presents with   Medical Management of Chronic Issues    Routine Visit.    HPI:  Pt is a 89 y.o. female with past medical history of depression, SVT, suprapubic catheter, depression, dementia, restless leg syndrome, COPD, hypertension is seen today for medical management of chronic diseases.   Patient seen and examined in her room.  She is sitting in her recliner chair.  Seems  pleasant and comfortable and does not appear to be in distress. Patient was recently evaluated by pneumonia and completed antibiotics.  She still has cough but states it is much better from last week.   Using albuterol  nebulizer twice a day for wheezing. Patient denies fevers, chills. Reports good appetite. Patient denies of shortness of breath, abdominal pain, nausea, vomiting, dysuria, bloody or dark-colored stools. She has suprapubic catheter which is exchanged once every 3 weeks.   Past Medical History:  Diagnosis Date   Acute bronchitis 05/23/2011   Acute upper respiratory infections of unspecified site 05/23/2011   Arthritis    Chronic airway obstruction, not elsewhere classified 05/23/2011   Coronary artery disease    Disturbance of salivary secretion 01/31/2011   Dizziness and giddiness 01/31/2011   Dyspnea    Essential tremor 04/25/2014   External hemorrhoids without mention of complication 01/31/2011   Gait disorder 04/25/2014   GERD (gastroesophageal reflux disease)    History of kidney stones    Insomnia, unspecified 09/12/2011   Lumbago 01/31/2011   Major depressive disorder, single episode, unspecified 01/31/2011   Memory disorder 04/25/2014   Mitral valve disorders(424.0) 01/31/2011   Other and unspecified hyperlipidemia 01/31/2011   Other convulsions 01/31/2011   Other emphysema (HCC) 01/31/2011   Pain in joint, site unspecified 01/31/2011   Restless legs syndrome (RLS) 09/12/2011   Retinal detachment with retinal defect of right eye 2011   right eye twice   Seizure disorder (HCC)    Senile  osteoporosis 01/31/2011   Spontaneous ecchymoses 01/31/2011   Squamous cell carcinoma of leg    Stiffness of joints, not elsewhere classified, multiple sites 01/31/2011   Unspecified essential hypertension 01/31/2011   Past Surgical History:  Procedure Laterality Date   ABDOMINAL HYSTERECTOMY  06/21/2003   TAH/BSO, omenectomy PSB resect, Stg IC cystadenofibroma    CHOLECYSTECTOMY  2005   Dr. [name]   CYSTOSCOPY W/ URETERAL STENT PLACEMENT Right 02/13/2022   Procedure: CYSTOSCOPY WITH RETROGRADE PYELOGRAM/URETERAL STENT PLACEMENT, AND SUPRAPUBIC TUBE EXCHANGE;  Surgeon: [name], MD;  Location: [facility];  Service: Urology;  Laterality: Right;   CYSTOSCOPY WITH RETROGRADE PYELOGRAM, URETEROSCOPY AND STENT PLACEMENT Right 03/06/2022   Procedure: CYSTOSCOPY WITH RETROGRADE PYELOGRAM, URETEROSCOPY AND STENT REPLACEMENT, SUPRAPUBIC CATHETER EXCHANGE;  Surgeon: [name], MD;  Location: [facility];  Service: Urology;  Laterality: Right;   ELBOW SURGERY Right 2008   broken   Dr. [name]   EYE SURGERY     HOLMIUM LASER APPLICATION Right 03/06/2022   Procedure: HOLMIUM LASER APPLICATION;  Surgeon: [name], MD;  Location: [facility];  Service: Urology;  Laterality: Right;   INTRAMEDULLARY (IM) NAIL INTERTROCHANTERIC Left 08/30/2021   Procedure: INTRAMEDULLARY (IM) NAIL INTERTROCHANTRIC;  Surgeon: [name], MD;  Location: [facility];  Service: Orthopedics;  Laterality: Left;   ORIF PATELLA Left 05/02/2020   Procedure: OPEN REDUCTION INTERNAL (ORIF) FIXATION LEFT PATELLA WITH MEDIAL AND LATERAL LIGAMENT REINFORCEMENTS;  Surgeon: [name], MD;  Location: [facility];  Service: Orthopedics;  Laterality: Left;   ORIF PATELLA Left 05/30/2020   Procedure: OPEN REDUCTION INTERNAL (ORIF) FIXATION PATELLA;  Surgeon: [name], MD;  Location: [facility];  Service: Orthopedics;  Laterality: Left;   RETINAL DETACHMENT SURGERY N/A    two   REVERSE SHOULDER ARTHROPLASTY Left 05/06/2019   Procedure: REVERSE SHOULDER ARTHROPLASTY;  Surgeon: Melita Drivers, MD;  Location: [facility];  Service: Orthopedics;  Laterality: Left;    ROTATOR CUFF REPAIR Right 2012   Dr. Gerome   SQUAMOUS CELL CARCINOMA EXCISION Bilateral 2012, 8/14   [place] on legs   Dr. Jadine   TONSILLECTOMY  1941   VIDEO BRONCHOSCOPY WITH ENDOBRONCHIAL NAVIGATION N/A 11/29/2015   Procedure: VIDEO  BRONCHOSCOPY WITH ENDOBRONCHIAL NAVIGATION;  Surgeon: Lamar GORMAN Chris, MD;  Location: [facility];  Service: Thoracic;  Laterality: N/A;    Allergies  Allergen Reactions   Vioxx [Rofecoxib] Shortness Of Breath   Dyazide [Hydrochlorothiazide-Triamterene] Other (See Comments)    Lowers blood pressure too much   Latex Swelling and Other (See Comments)    Allergic, per [facility]   Sulfa Antibiotics Nausea And Vomiting and Other (See Comments)    Allergic, per [facility]   Sumycin [Tetracycline] Other (See Comments)    Can't take due to drug reaction- Allergic, per [facility]    Allergies as of 10/02/2023       Reactions   Vioxx [rofecoxib] Shortness Of Breath   Dyazide [hydrochlorothiazide-triamterene] Other (See Comments)   Lowers blood pressure too much   Latex Swelling, Other (See Comments)   Allergic, per [facility]   Sulfa Antibiotics Nausea And Vomiting, Other (See Comments)   Allergic, per [facility]   Sumycin [tetracycline] Other (See Comments)   Can't take due to drug reaction- Allergic, per [facility]        Medication List        Accurate as of October 02, 2023 11:43 AM. If you have any questions, ask your nurse or doctor.  acetaminophen  325 MG tablet Commonly known as: TYLENOL  Take 650 mg by mouth every 6 (six) hours as needed for fever or mild pain. Give 2 tablet by mouth every 6 hours as needed for Pain   acetic acid 0.25 % irrigation Irrigate with 1 Application as directed 2 (two) times daily.   carvedilol  3.125 MG tablet Commonly known as: COREG  Take 1 tablet (3.125 mg total) by mouth 2 (two) times daily with a meal.   dextromethorphan-guaiFENesin 30-600 MG 12hr tablet Commonly known as: MUCINEX DM Take 1 tablet by mouth 2 (two) times daily.   docusate sodium  100 MG capsule Commonly known as: COLACE Take 100 mg by mouth as needed for mild constipation.   ipratropium-albuterol  0.5-2.5 (3) MG/3ML Soln Commonly known as: DUONEB Take 3 mLs by nebulization as directed. Inhale  orally one time only for cough/wheezing for 1 day   ketoconazole 2 % shampoo Commonly known as: NIZORAL Apply 1 Application topically 2 (two) times a week. Monday and Thursday   levofloxacin  500 MG tablet Commonly known as: LEVAQUIN  Take 500 mg by mouth daily.   mirtazapine  7.5 MG tablet Commonly known as: REMERON  Take 7.5 mg by mouth daily.   mirtazapine  15 MG tablet Commonly known as: REMERON  Take 15 mg by mouth daily.   Morphine  Sulfate (Concentrate) 5 MG/0.25ML Soln Take 0.25 mLs by mouth every 8 (eight) hours as needed.   Nyamyc powder Generic drug: nystatin Apply 1 Application topically daily as needed.   nystatin cream Commonly known as: MYCOSTATIN Apply 1 Application topically as needed.   Orajel 2X Toothache & Gum 20-0.26 % Gel Generic drug: Benzocaine-Menthol  Use as directed 1 Dose in the mouth or throat every 6 (six) hours as needed (Tooth Pain).   oxybutynin  5 MG tablet Commonly known as: DITROPAN  Take 5 mg by mouth 2 (two) times daily.   potassium chloride  10 MEQ CR capsule Commonly known as: MICRO-K  Take 10 mEq by mouth daily.   pramipexole  0.5 MG tablet Commonly known as: MIRAPEX  Take 0.5 mg by mouth at bedtime.   pramipexole  0.25 MG tablet Commonly known as: MIRAPEX  Take 0.25 mg by mouth daily. As needed for restless legs, see other bedtime listing   ProAir  RespiClick 108 (90 Base) MCG/ACT Aepb Generic drug: Albuterol  Sulfate Inhale 2 puffs into the lungs daily as needed (shortness of breath or wheezing).   albuterol  1.25 MG/3ML nebulizer solution Commonly known as: ACCUNEB  Take 1 ampule by nebulization 2 (two) times daily.   albuterol  1.25 MG/3ML nebulizer solution Commonly known as: ACCUNEB  Take 1 ampule by nebulization every 8 (eight) hours as needed for shortness of breath.        Review of Systems  Constitutional:  Negative for fever.  Respiratory:  Positive for cough and wheezing. Negative for shortness of breath.    Cardiovascular:  Negative for chest pain, palpitations and leg swelling.  Gastrointestinal:  Negative for abdominal distention, abdominal pain, blood in stool, constipation, diarrhea, nausea and vomiting.  Genitourinary:  Negative for dysuria.  Neurological:  Negative for dizziness.    Immunization History  Administered Date(s) Administered   Fluzone Influenza virus vaccine,trivalent (IIV3), split virus 01/15/2017, 01/08/2018, 12/09/2018   Influenza Split 01/06/2014   Influenza Whole 01/07/2012, 01/06/2013   Influenza, High Dose Seasonal PF 12/25/2015, 01/20/2017, 01/08/2023   Influenza,inj,Quad PF,6+ Mos 12/21/2014   Influenza-Unspecified 12/09/2018, 01/18/2020, 01/24/2021, 01/30/2022   Moderna SARS-COV2 Booster Vaccination 04/02/2021   Moderna Sars-Covid-2 Vaccination 04/12/2019, 06/05/2019, 02/15/2020, 09/05/2020   PFIZER(Purple Top)SARS-COV-2 Vaccination 12/26/2020   PPD  Test 02/16/2022   Pneumococcal Conjugate-13 02/01/2014   Pneumococcal Polysaccharide-23 12/20/1992, 01/15/2000, 04/08/2004, 06/08/2004   Rsv, Bivalent, Protein Subunit Rsvpref,pf Marlow) 04/26/2022   Td 04/08/2002, 04/22/2002   Tdap 04/09/2011, 08/17/2015   Unspecified SARS-COV-2 Vaccination 01/22/2023   Zoster Recombinant(Shingrix) 05/29/2005, 08/27/2017, 06/13/2022   Zoster, Live 04/08/2008, 05/29/2014, 05/28/2017   Zoster, Unspecified 05/29/2005   Pertinent  Health Maintenance Due  Topic Date Due   INFLUENZA VACCINE  11/07/2023   DEXA SCAN  Completed   MAMMOGRAM  Discontinued      02/15/2022    3:00 PM 02/15/2022    9:00 PM 02/16/2022    8:00 AM 03/06/2022    7:25 AM 05/20/2022    3:42 PM  Fall Risk  Falls in the past year?     0  Was there an injury with Fall?     0  Fall Risk Category Calculator     0  (RETIRED) Patient Fall Risk Level Low fall risk  Moderate fall risk  Moderate fall risk  High fall risk    Patient at Risk for Falls Due to     No Fall Risks  Fall risk Follow up     Falls  evaluation completed     Data saved with a previous flowsheet row definition   Functional Status Survey:    Vitals:   10/02/23 1141  BP: 137/86  Pulse: 84  Resp: 18  Temp: (!) 97.5 F (36.4 C)  SpO2: 91%  Weight: 172 lb 3.2 oz (78.1 kg)  Height: 5' 2 (1.575 m)   Body mass index is 31.5 kg/m. Physical Exam Constitutional:      Appearance: Normal appearance.  HENT:     Head: Normocephalic and atraumatic.   Cardiovascular:     Rate and Rhythm: Normal rate and regular rhythm.  Pulmonary:     Effort: Pulmonary effort is normal. No respiratory distress.     Breath sounds: Wheezing present.  Abdominal:     General: Bowel sounds are normal. There is no distension.     Tenderness: There is no abdominal tenderness. There is no guarding or rebound.     Comments:     Musculoskeletal:        General: No swelling.   Neurological:     Mental Status: She is alert. Mental status is at baseline.     Motor: No weakness.     Labs reviewed: Recent Labs    04/01/23 0000  NA 144  K 3.9  CL 107  CO2 30*  BUN 37*  CREATININE 0.9  CALCIUM 9.5   Recent Labs    04/01/23 0000  AST 21  ALT 16  ALKPHOS 124  ALBUMIN  3.8   Recent Labs    04/01/23 0000  WBC 5.4  NEUTROABS 3,175.00  HGB 12.5  HCT 40  PLT 167   Lab Results  Component Value Date   TSH 1.198 02/15/2022   No results found for: HGBA1C Lab Results  Component Value Date   CHOL 147 09/27/2021   HDL 48 09/27/2021   LDLCALC 78 09/27/2021   TRIG 131 09/27/2021   CHOLHDL 3.6 06/27/2015    Significant Diagnostic Results in last 30 days:  No results found.  Assessment/Plan Hospice care patient Patient seems pleasant and comfortable and does not appear to be in distress Continue Tylenol , morphine  as needed  Major neurocognitive disorder No agitation or delirium reported by nursing staff Increase cognitively engaging activities Monitor for delirium Continue assistance with ADLs  COPD  Continue  with albuterol  nebulizer twice daily, every 6 as needed  Hypertension SVT Stable Continue with Coreg   Restless leg syndrome Stable Continue with pramipexole   Insomnia Continue with Remeron    30 min Total time spent for obtaining history,  performing a medically appropriate examination and evaluation, reviewing the tests,   documenting clinical information in the electronic or other health record care coordination (not separately reported)

## 2023-10-06 ENCOUNTER — Encounter: Payer: Self-pay | Admitting: Sports Medicine

## 2023-10-09 ENCOUNTER — Non-Acute Institutional Stay (SKILLED_NURSING_FACILITY): Payer: Self-pay | Admitting: Nurse Practitioner

## 2023-10-09 ENCOUNTER — Encounter: Payer: Self-pay | Admitting: Nurse Practitioner

## 2023-10-09 DIAGNOSIS — G40909 Epilepsy, unspecified, not intractable, without status epilepticus: Secondary | ICD-10-CM

## 2023-10-09 DIAGNOSIS — J439 Emphysema, unspecified: Secondary | ICD-10-CM

## 2023-10-09 DIAGNOSIS — Z9359 Other cystostomy status: Secondary | ICD-10-CM

## 2023-10-09 DIAGNOSIS — F0390 Unspecified dementia without behavioral disturbance: Secondary | ICD-10-CM

## 2023-10-09 DIAGNOSIS — Z66 Do not resuscitate: Secondary | ICD-10-CM

## 2023-10-09 DIAGNOSIS — I10 Essential (primary) hypertension: Secondary | ICD-10-CM | POA: Diagnosis not present

## 2023-10-09 DIAGNOSIS — G2581 Restless legs syndrome: Secondary | ICD-10-CM

## 2023-10-09 DIAGNOSIS — F323 Major depressive disorder, single episode, severe with psychotic features: Secondary | ICD-10-CM

## 2023-10-09 DIAGNOSIS — I471 Supraventricular tachycardia, unspecified: Secondary | ICD-10-CM

## 2023-10-09 LAB — CBC: RBC: 5.31 — AB (ref 3.87–5.11)

## 2023-10-09 LAB — CBC AND DIFFERENTIAL
HCT: 43 (ref 36–46)
Hemoglobin: 12.8 (ref 12.0–16.0)
Neutrophils Absolute: 2857
Platelets: 308 K/uL (ref 150–400)
WBC: 5.4

## 2023-10-09 LAB — COMPREHENSIVE METABOLIC PANEL WITH GFR
Albumin: 3.5 (ref 3.5–5.0)
Calcium: 9 (ref 8.7–10.7)
Globulin: 2.9

## 2023-10-09 LAB — BASIC METABOLIC PANEL WITH GFR
BUN: 13 (ref 4–21)
CO2: 30 — AB (ref 13–22)
Chloride: 105 (ref 99–108)
Creatinine: 0.8 (ref 0.5–1.1)
Glucose: 64
Potassium: 4.4 meq/L (ref 3.5–5.1)
Sodium: 142 (ref 137–147)

## 2023-10-09 LAB — HEPATIC FUNCTION PANEL
ALT: 9 U/L (ref 7–35)
AST: 14 (ref 13–35)

## 2023-10-09 NOTE — Assessment & Plan Note (Signed)
Heart rate is in control,  takes Coreg 

## 2023-10-09 NOTE — Assessment & Plan Note (Signed)
 stable, on Mirtazapine , off Lorazepam 

## 2023-10-09 NOTE — Assessment & Plan Note (Signed)
 under hospice service, prn Morphine available to her.

## 2023-10-09 NOTE — Assessment & Plan Note (Signed)
,   f/u Urology, off Uribel and Oxybutynin , Hx of urethral stricture.

## 2023-10-09 NOTE — Assessment & Plan Note (Signed)
 no active seizures, off meds.

## 2023-10-09 NOTE — Assessment & Plan Note (Signed)
 intermittent elevated Bp, asymptomatic, not on meds.

## 2023-10-09 NOTE — Assessment & Plan Note (Signed)
 taking MiraPex 

## 2023-10-09 NOTE — Assessment & Plan Note (Signed)
 on ProAir , Albuterol  neb, Mucinex, chronic cough, adding Tessalon 100mg  tid po x 3 days.

## 2023-10-09 NOTE — Progress Notes (Signed)
 Location:  [name] Room Number: [phone] A Place of Service:  SNF (31) Provider:  Shiloh Southern X, NP  Patient Care Team: Saphyra Hutt X, NP as PCP - General (Internal Medicine) [name], MD as Consulting Physician (Ophthalmology) [name], MD as Consulting Physician (Cardiology) [name], MD as Consulting Physician (Dermatology) [name], MD as Consulting Physician (Orthopedic Surgery) [name], MD (Inactive) as Consulting Physician (Orthopedic Surgery) [name], MD as Consulting Physician (Orthopedic Surgery) [name] Bennetta Rudden X, NP as Nurse Practitioner (Nurse Practitioner) [name], MD (Inactive) as Consulting Physician (Neurology)  Extended Emergency Contact Information Primary Emergency Contact: [name],[name] Address: [address]          [address]          [place], [place] [address] [place] Home Phone: [phone] Relation: Sister Secondary Emergency Contact: [name],[name]  [place] Home Phone: [phone] Mobile Phone: [phone] Relation: Sister  Code Status:  DNR Goals of care: Advanced Directive information    09/22/2023    1:46 PM  Advanced Directives  Does Patient Have a Medical Advance Directive? Yes  Type of Advance Directive Out of facility DNR (pink MOST or yellow form)  Does patient want to make changes to medical advance directive? No - Patient declined     Chief Complaint  Patient presents with   Medical Management of Chronic Issues    Routine visit     HPI:  Pt is a 89 y.o. female seen today for medical management of chronic diseases.   02/12/22 Obstructive nephritis, underwent cystoscopy with right retrograde pyelogram, right ureteral stent placed, f/u Urology Dr. Alvaro, 03/06/22 cystoscopy with retrograde pyelogram, ureteroscopy and stent replacement, SPC exchange.                05/03/22 abd US  no renal or gallbladder stone or  hydronephrosis. 05/02/22 Xray abd may represent ileus. Hx of Ureteral stone with hydronephrosis per CT abd                          05/13/22 Urology: R ureteral stone, sp R ureteroscopy stone free 03/2022, stent. SPC  urethral stricture, off Oxybutynin . Hx of Ureteral stone with hydronephrosis per CT abd             External hemorrhoids, placed on Colace             Hypokalemia, K 4.4 10/09/23, taking  Kcl             Anemia, Hgb 12.8 10/09/23             SVT, takes Coreg              BLE edema, mild,  off Furosemide .              Adult failure to thrive, under Hospice service, weight stable.              Suprapubic Catheter, f/u Urology, off Uribel and Oxybutynin , Hx of urethral stricture.              Depression, stable, on Mirtazapine , off Lorazepam              Dementia, under hospice service, prn Morphine  available to her.              Restless leg syndrome,  taking MiraPex               Hx of  seizures, no active seizures, off meds.              COPD, on ProAir , Albuterol  neb, Mucinex, chronic cough             HTN, intermittent elevated Bp, asymptomatic, not on meds.       Past Medical History:  Diagnosis Date   Acute bronchitis 05/23/2011   Acute upper respiratory infections of unspecified site 05/23/2011   Arthritis    Chronic airway obstruction, not elsewhere classified 05/23/2011   Coronary artery disease    Disturbance of salivary secretion 01/31/2011   Dizziness and giddiness 01/31/2011   Dyspnea    Essential tremor 04/25/2014   External hemorrhoids without mention of complication 01/31/2011   Gait disorder 04/25/2014   GERD (gastroesophageal reflux disease)    History of kidney stones    Insomnia, unspecified 09/12/2011   Lumbago 01/31/2011   Major depressive disorder, single episode, unspecified 01/31/2011   Memory disorder 04/25/2014   Mitral valve disorders(424.0) 01/31/2011   Other and unspecified hyperlipidemia 01/31/2011   Other convulsions 01/31/2011   Other  emphysema (HCC) 01/31/2011   Pain in joint, site unspecified 01/31/2011   Restless legs syndrome (RLS) 09/12/2011   Retinal detachment with retinal defect of right eye 2011   right eye twice   Seizure disorder (HCC)    Senile osteoporosis 01/31/2011   Spontaneous ecchymoses 01/31/2011   Squamous cell carcinoma of leg    Stiffness of joints, not elsewhere classified, multiple sites 01/31/2011   Unspecified essential hypertension 01/31/2011   Past Surgical History:  Procedure Laterality Date   ABDOMINAL HYSTERECTOMY  06/21/2003   TAH/BSO, omenectomy PSB resect, Stg IC cystadenofibroma   CHOLECYSTECTOMY  2005   Dr. [name]   CYSTOSCOPY W/ URETERAL STENT PLACEMENT Right 02/13/2022   Procedure: CYSTOSCOPY WITH RETROGRADE PYELOGRAM/URETERAL STENT PLACEMENT, AND SUPRAPUBIC TUBE EXCHANGE;  Surgeon: [name], MD;  Location: [facility];  Service: Urology;  Laterality: Right;   CYSTOSCOPY WITH RETROGRADE PYELOGRAM, URETEROSCOPY AND STENT PLACEMENT Right 03/06/2022   Procedure: CYSTOSCOPY WITH RETROGRADE PYELOGRAM, URETEROSCOPY AND STENT REPLACEMENT, SUPRAPUBIC CATHETER EXCHANGE;  Surgeon: [name], MD;  Location: [facility];  Service: Urology;  Laterality: Right;   ELBOW SURGERY Right 2008   broken   Dr. [name]   EYE SURGERY     HOLMIUM LASER APPLICATION Right 03/06/2022   Procedure: HOLMIUM LASER APPLICATION;  Surgeon: [name], MD;  Location: [facility];  Service: Urology;  Laterality: Right;   INTRAMEDULLARY (IM) NAIL INTERTROCHANTERIC Left 08/30/2021   Procedure: INTRAMEDULLARY (IM) NAIL INTERTROCHANTRIC;  Surgeon: [name], MD;  Location: [facility];  Service: Orthopedics;  Laterality: Left;   ORIF PATELLA Left 05/02/2020   Procedure: OPEN REDUCTION INTERNAL (ORIF) FIXATION LEFT PATELLA WITH MEDIAL AND LATERAL LIGAMENT REINFORCEMENTS;  Surgeon: [name], MD;  Location: [facility];  Service: Orthopedics;  Laterality: Left;   ORIF PATELLA Left 05/30/2020   Procedure: OPEN REDUCTION  INTERNAL (ORIF) FIXATION PATELLA;  Surgeon: [name], MD;  Location: [facility];  Service: Orthopedics;  Laterality: Left;   RETINAL DETACHMENT SURGERY N/A    two   REVERSE SHOULDER ARTHROPLASTY Left 05/06/2019   Procedure: REVERSE SHOULDER ARTHROPLASTY;  Surgeon: Melita Drivers, MD;  Location: [facility];  Service: Orthopedics;  Laterality: Left;    ROTATOR CUFF REPAIR Right 2012   Dr. Gerome   SQUAMOUS CELL CARCINOMA EXCISION Bilateral 2012, 8/14   [place] on legs   Dr. Jadine   TONSILLECTOMY  1941   VIDEO BRONCHOSCOPY WITH  ENDOBRONCHIAL NAVIGATION N/A 11/29/2015   Procedure: VIDEO BRONCHOSCOPY WITH ENDOBRONCHIAL NAVIGATION;  Surgeon: Lamar GORMAN Chris, MD;  Location: [facility];  Service: Thoracic;  Laterality: N/A;    Allergies  Allergen Reactions   Vioxx [Rofecoxib] Shortness Of Breath   Dyazide [Hydrochlorothiazide-Triamterene] Other (See Comments)    Lowers blood pressure too much   Latex Swelling and Other (See Comments)    Allergic, per [facility]   Sulfa Antibiotics Nausea And Vomiting and Other (See Comments)    Allergic, per [facility]   Sumycin [Tetracycline] Other (See Comments)    Can't take due to drug reaction- Allergic, per [facility]    Outpatient Encounter Medications as of 10/09/2023  Medication Sig   acetaminophen  (TYLENOL ) 325 MG tablet Take 650 mg by mouth every 6 (six) hours as needed for fever or mild pain. Give 2 tablet by mouth every 6 hours as needed for Pain   acetic acid 0.25 % irrigation Irrigate with 1 Application as directed 2 (two) times daily.   albuterol  (ACCUNEB ) 1.25 MG/3ML nebulizer solution Take 1 ampule by nebulization every 8 (eight) hours as needed for shortness of breath.   albuterol  (ACCUNEB ) 1.25 MG/3ML nebulizer solution Take 1 ampule by nebulization 2 (two) times daily.   Albuterol  Sulfate (PROAIR  RESPICLICK) 108 (90 Base) MCG/ACT AEPB Inhale 2 puffs into the lungs daily as needed (shortness of breath or wheezing).   Benzocaine-Menthol  (ORAJEL 2X  TOOTHACHE & GUM) 20-0.26 % GEL Use as directed 1 Dose in the mouth or throat every 6 (six) hours as needed (Tooth Pain).   carvedilol  (COREG ) 3.125 MG tablet Take 1 tablet (3.125 mg total) by mouth 2 (two) times daily with a meal.   dextromethorphan-guaiFENesin (MUCINEX DM) 30-600 MG 12hr tablet Take 1 tablet by mouth every 6 (six) hours as needed (congestion).   docusate sodium  (COLACE) 100 MG capsule Take 100 mg by mouth as needed for mild constipation.   mirtazapine  (REMERON ) 15 MG tablet Take 15 mg by mouth daily.   Morphine  Sulfate, Concentrate, 5 MG/0.25ML SOLN Take 0.25 mLs by mouth every 8 (eight) hours as needed.   nystatin cream (MYCOSTATIN) Apply 1 Application topically as needed.   potassium chloride  (MICRO-K ) 10 MEQ CR capsule Take 10 mEq by mouth daily.   pramipexole  (MIRAPEX ) 0.25 MG tablet Take 0.25 mg by mouth daily. As needed for restless legs, see other bedtime listing   ipratropium-albuterol  (DUONEB) 0.5-2.5 (3) MG/3ML SOLN Take 3 mLs by nebulization as directed. Inhale orally one time only for cough/wheezing for 1 day (Patient not taking: Reported on 10/09/2023)   ketoconazole (NIZORAL) 2 % shampoo Apply 1 Application topically 2 (two) times a week. Monday and Thursday (Patient not taking: Reported on 10/09/2023)   levofloxacin  (LEVAQUIN ) 500 MG tablet Take 500 mg by mouth daily. (Patient not taking: Reported on 10/09/2023)   mirtazapine  (REMERON ) 7.5 MG tablet Take 7.5 mg by mouth daily. (Patient not taking: Reported on 10/09/2023)   NYAMYC powder Apply 1 Application topically daily as needed. (Patient not taking: Reported on 10/02/2023)   oxybutynin  (DITROPAN ) 5 MG tablet Take 5 mg by mouth 2 (two) times daily. (Patient not taking: Reported on 10/02/2023)   pramipexole  (MIRAPEX ) 0.5 MG tablet Take 0.5 mg by mouth at bedtime. (Patient not taking: Reported on 10/09/2023)   No facility-administered encounter medications on file as of 10/09/2023.    Review of Systems  Constitutional:   Negative for appetite change, fatigue and fever.  HENT:  Positive for hearing loss. Negative for congestion and trouble  swallowing.   Eyes:  Negative for visual disturbance.  Respiratory:  Positive for cough. Negative for shortness of breath.        DOE is chronic  Cardiovascular:  Positive for leg swelling.  Gastrointestinal:  Negative for abdominal pain and constipation.       Hemorrhoids from previous examination.   Genitourinary:  Positive for difficulty urinating.       [facility]  Musculoskeletal:  Positive for arthralgias and gait problem.       S/p left hip ORIF, ORIF of the left patella. Positional pain in the left knee sometimes.   Skin:  Negative for color change.       BLE discoloration, chronic venous insufficiency skin changes   Neurological:  Negative for seizures, weakness and headaches.       Memory lapses. Hx of seizures. RLS  Psychiatric/Behavioral:  Negative for behavioral problems and sleep disturbance. The patient is not nervous/anxious.     Immunization History  Administered Date(s) Administered   Fluzone Influenza virus vaccine,trivalent (IIV3), split virus 01/15/2017, 01/08/2018, 12/09/2018   Influenza Split 01/06/2014   Influenza Whole 01/07/2012, 01/06/2013   Influenza, High Dose Seasonal PF 12/25/2015, 01/20/2017, 01/08/2023   Influenza,inj,Quad PF,6+ Mos 12/21/2014   Influenza-Unspecified 12/09/2018, 01/18/2020, 01/24/2021, 01/30/2022   Moderna SARS-COV2 Booster Vaccination 04/02/2021   Moderna Sars-Covid-2 Vaccination 04/12/2019, 06/05/2019, 02/15/2020, 09/05/2020   PFIZER(Purple Top)SARS-COV-2 Vaccination 12/26/2020   PPD Test 02/16/2022   Pneumococcal Conjugate-13 02/01/2014   Pneumococcal Polysaccharide-23 12/20/1992, 01/15/2000, 04/08/2004, 06/08/2004   Rsv, Bivalent, Protein Subunit Rsvpref,pf Marlow) 04/26/2022   Td 04/08/2002, 04/22/2002   Tdap 04/09/2011, 08/17/2015   Unspecified SARS-COV-2 Vaccination 01/22/2023   Zoster Recombinant(Shingrix)  05/29/2005, 08/27/2017, 06/13/2022   Zoster, Live 04/08/2008, 05/29/2014, 05/28/2017   Zoster, Unspecified 05/29/2005   Pertinent  Health Maintenance Due  Topic Date Due   INFLUENZA VACCINE  11/07/2023   DEXA SCAN  Completed   MAMMOGRAM  Discontinued      02/15/2022    3:00 PM 02/15/2022    9:00 PM 02/16/2022    8:00 AM 03/06/2022    7:25 AM 05/20/2022    3:42 PM  Fall Risk  Falls in the past year?     0  Was there an injury with Fall?     0  Fall Risk Category Calculator     0  (RETIRED) Patient Fall Risk Level Low fall risk  Moderate fall risk  Moderate fall risk  High fall risk    Patient at Risk for Falls Due to     No Fall Risks  Fall risk Follow up     Falls evaluation completed     Data saved with a previous flowsheet row definition   Functional Status Survey:    Vitals:   10/09/23 1009  BP: 124/82  Pulse: 65  SpO2: 98%  Weight: 168 lb 12.8 oz (76.6 kg)  Height: 5' 2 (1.575 m)   Body mass index is 30.87 kg/m. Physical Exam Vitals and nursing note reviewed.  Constitutional:      Appearance: Normal appearance.  HENT:     Head: Normocephalic and atraumatic.     Nose: Nose normal.     Mouth/Throat:     Mouth: Mucous membranes are moist.     Pharynx: No posterior oropharyngeal erythema.  Eyes:     Extraocular Movements: Extraocular movements intact.     Conjunctiva/sclera: Conjunctivae normal.     Right eye: Right conjunctiva is not injected.     Left eye: Left conjunctiva  is not injected.     Pupils: Pupils are equal, round, and reactive to light.  Neck:     Comments: Left adam's apple bony aspect, sometimes feels soreness on palpation is chronic, no noted lymph nodes  Cardiovascular:     Rate and Rhythm: Normal rate and regular rhythm.     Heart sounds: Murmur heard.     Comments: PD pulses are not felt from previous examination.  Pulmonary:     Effort: Pulmonary effort is normal.     Breath sounds: Rales present.     Comments: Decreased air entry  to both lungs.  Bibasilar rales.  Abdominal:     General: Bowel sounds are normal.     Palpations: Abdomen is soft.     Tenderness: There is no abdominal tenderness.     Comments: Mid abd surgical scar. SPC  Genitourinary:    Comments: SPC. External hemorrhoids x2, no injury, bleeding, or pain  Musculoskeletal:     Cervical back: Normal range of motion and neck supple.     Right lower leg: Edema present.     Left lower leg: Edema present.     Comments: Decreased overhead ROM of the left shoulder. Left knee s/p ORIF of the patella fx.  Trace edema BLE is chronic  Skin:    General: Skin is warm and dry.     Findings: Lesion present.     Comments: BLE discoloration, chronic venous insufficiency skin changes. Left shin skin lesions-opted out workup or tx.      Neurological:     General: No focal deficit present.     Mental Status: She is alert. Mental status is at baseline.     Gait: Gait abnormal.  Psychiatric:        Mood and Affect: Mood normal.        Behavior: Behavior normal.     Labs reviewed: Recent Labs    04/01/23 0000  NA 144  K 3.9  CL 107  CO2 30*  BUN 37*  CREATININE 0.9  CALCIUM 9.5   Recent Labs    04/01/23 0000  AST 21  ALT 16  ALKPHOS 124  ALBUMIN  3.8   Recent Labs    04/01/23 0000  WBC 5.4  NEUTROABS 3,175.00  HGB 12.5  HCT 40  PLT 167   Lab Results  Component Value Date   TSH 1.198 02/15/2022   No results found for: HGBA1C Lab Results  Component Value Date   CHOL 147 09/27/2021   HDL 48 09/27/2021   LDLCALC 78 09/27/2021   TRIG 131 09/27/2021   CHOLHDL 3.6 06/27/2015    Significant Diagnostic Results in last 30 days:  No results found.  Assessment/Plan COPD (chronic obstructive pulmonary disease) (HCC) on ProAir , Albuterol  neb, Mucinex, chronic cough, adding Tessalon 100mg  tid po x 3 days.   Essential hypertension  intermittent elevated Bp, asymptomatic, not on meds.   Seizure disorder (HCC)  no active seizures,  off meds.   Restless legs syndrome (RLS)  taking MiraPex    Major neurocognitive disorder (HCC) under hospice service, prn Morphine  available to her.   Depression, psychotic (HCC)  stable, on Mirtazapine , off Lorazepam   Suprapubic catheter (HCC) , f/u Urology, off Uribel and Oxybutynin , Hx of urethral stricture.   SVT (supraventricular tachycardia) Heart rate is in control, takes Coreg      Family/ staff Communication: plan of care reviewed with the patient and charge nurse.   Labs/tests ordered:  pending CBC/diff, CMP/eGFR

## 2023-10-13 ENCOUNTER — Encounter: Payer: Self-pay | Admitting: Nurse Practitioner

## 2023-10-28 DIAGNOSIS — F411 Generalized anxiety disorder: Secondary | ICD-10-CM | POA: Diagnosis not present

## 2023-10-28 DIAGNOSIS — F331 Major depressive disorder, recurrent, moderate: Secondary | ICD-10-CM | POA: Diagnosis not present

## 2023-10-31 ENCOUNTER — Non-Acute Institutional Stay (SKILLED_NURSING_FACILITY): Payer: Self-pay | Admitting: Sports Medicine

## 2023-10-31 DIAGNOSIS — Z515 Encounter for palliative care: Secondary | ICD-10-CM

## 2023-10-31 DIAGNOSIS — M79605 Pain in left leg: Secondary | ICD-10-CM

## 2023-10-31 DIAGNOSIS — M79604 Pain in right leg: Secondary | ICD-10-CM | POA: Diagnosis not present

## 2023-10-31 NOTE — Progress Notes (Signed)
 Location:  [name]ing Home Room Number: N029-A Place of Service:  SNF (31) Provider:  Sherlynn Albert, MD    Mast, Man X, NP  Patient Care Team: Mast, Man X, NP as PCP - General (Internal Medicine) Martha Charleston, MD as Consulting Physician (Ophthalmology) [name], MD as Consulting Physician (Cardiology) [name], MD as Consulting Physician (Dermatology) [name], MD as Consulting Physician (Orthopedic Surgery) [name], MD (Inactive) as Consulting Physician (Orthopedic Surgery) [name], MD as Consulting Physician (Orthopedic Surgery) [name] Mast, Man X, NP as Nurse Practitioner (Nurse Practitioner) [name], MD (Inactive) as Consulting Physician (Neurology)  Extended Emergency Contact Information Primary Emergency Contact: [name],[name] Address: [address]          [address]          [place], [place] [address] [place] Home Phone: [phone] Relation: Sister Secondary Emergency Contact: Arthur Randall [place] Home Phone: [phone] Mobile Phone: [phone] Relation: Sister  Code Status:  DNR Goals of care: Advanced Directive information    10/31/2023    2:47 PM  Advanced Directives  Does Patient Have a Medical Advance Directive? Yes  Type of Advance Directive Out of facility DNR (pink MOST or yellow form)  Does patient want to make changes to medical advance directive? No - Patient declined     Chief Complaint  Patient presents with   Acute Visit     pain in left lower extremity                                Hospice care patient  HPI:  Pt is a 89 y.o. female with PMH of Dementia, depression, COPD, HTN, obstructive uropathy s/p SPC is seen today for an acute visit for pain in her left lower leg.  Pt seen and examined in her room, she seems pleasant and comfortable and does not appear to be in distress.  As per nursing staff pt c/o pain in  her left lower leg this morning when she was taken to shower. Pt has chronic lower extremity swelling    Past Medical History:  Diagnosis Date   Acute bronchitis 05/23/2011   Acute upper respiratory infections of unspecified site 05/23/2011   Arthritis    Chronic airway obstruction, not elsewhere classified 05/23/2011   Coronary artery disease    Disturbance of salivary secretion 01/31/2011   Dizziness and giddiness 01/31/2011   Dyspnea    Essential tremor 04/25/2014   External hemorrhoids without mention of complication 01/31/2011   Gait disorder 04/25/2014   GERD (gastroesophageal reflux disease)    History of kidney stones    Insomnia, unspecified 09/12/2011   Lumbago 01/31/2011   Major depressive disorder, single episode, unspecified 01/31/2011   Memory disorder 04/25/2014   Mitral valve disorders(424.0) 01/31/2011   Other and unspecified hyperlipidemia 01/31/2011   Other convulsions 01/31/2011   Other emphysema (HCC) 01/31/2011   Pain in joint, site unspecified 01/31/2011   Restless legs syndrome (RLS) 09/12/2011   Retinal detachment with retinal defect of right eye 2011   right eye twice   Seizure disorder (HCC)    Senile osteoporosis 01/31/2011   Spontaneous ecchymoses 01/31/2011   Squamous cell carcinoma of leg    Stiffness of joints, not elsewhere classified, multiple sites 01/31/2011   Unspecified essential hypertension 01/31/2011   Past Surgical History:  Procedure Laterality Date   ABDOMINAL HYSTERECTOMY  06/21/2003   TAH/BSO, omenectomy PSB resect, Stg IC cystadenofibroma   CHOLECYSTECTOMY  2005   Dr. [name]   CYSTOSCOPY W/ URETERAL STENT PLACEMENT Right 02/13/2022   Procedure: CYSTOSCOPY WITH RETROGRADE PYELOGRAM/URETERAL STENT PLACEMENT, AND SUPRAPUBIC TUBE EXCHANGE;  Surgeon: [name], MD;  Location: [facility];  Service: Urology;  Laterality: Right;   CYSTOSCOPY WITH RETROGRADE PYELOGRAM, URETEROSCOPY AND STENT PLACEMENT Right 03/06/2022   Procedure:  CYSTOSCOPY WITH RETROGRADE PYELOGRAM, URETEROSCOPY AND STENT REPLACEMENT, SUPRAPUBIC CATHETER EXCHANGE;  Surgeon: [name], MD;  Location: [facility];  Service: Urology;  Laterality: Right;   ELBOW SURGERY Right 2008   broken   Dr. [name]   EYE SURGERY     HOLMIUM LASER APPLICATION Right 03/06/2022   Procedure: HOLMIUM LASER APPLICATION;  Surgeon: [name], MD;  Location: [facility];  Service: Urology;  Laterality: Right;   INTRAMEDULLARY (IM) NAIL INTERTROCHANTERIC Left 08/30/2021   Procedure: INTRAMEDULLARY (IM) NAIL INTERTROCHANTRIC;  Surgeon: [name], MD;  Location: [facility];  Service: Orthopedics;  Laterality: Left;   ORIF PATELLA Left 05/02/2020   Procedure: OPEN REDUCTION INTERNAL (ORIF) FIXATION LEFT PATELLA WITH MEDIAL AND LATERAL LIGAMENT REINFORCEMENTS;  Surgeon: [name], MD;  Location: [facility];  Service: Orthopedics;  Laterality: Left;   ORIF PATELLA Left 05/30/2020   Procedure: OPEN REDUCTION INTERNAL (ORIF) FIXATION PATELLA;  Surgeon: [name], MD;  Location: [facility];  Service: Orthopedics;  Laterality: Left;   RETINAL DETACHMENT SURGERY N/A    two   REVERSE SHOULDER ARTHROPLASTY Left 05/06/2019   Procedure: REVERSE SHOULDER ARTHROPLASTY;  Surgeon: Melita Drivers, MD;  Location: [facility];  Service: Orthopedics;  Laterality: Left;    ROTATOR CUFF REPAIR Right 2012   Dr. Gerome   SQUAMOUS CELL CARCINOMA EXCISION Bilateral 2012, 8/14   [place] on legs   Dr. Jadine   TONSILLECTOMY  1941   VIDEO BRONCHOSCOPY WITH ENDOBRONCHIAL NAVIGATION N/A 11/29/2015   Procedure: VIDEO BRONCHOSCOPY WITH ENDOBRONCHIAL NAVIGATION;  Surgeon: Lamar GORMAN Chris, MD;  Location: [facility];  Service: Thoracic;  Laterality: N/A;    Allergies  Allergen Reactions   Vioxx [Rofecoxib] Shortness Of Breath   Dyazide [Hydrochlorothiazide-Triamterene] Other (See Comments)    Lowers blood pressure too much   Latex Swelling and Other (See Comments)    Allergic, per [facility]   Sulfa  Antibiotics Nausea And Vomiting and Other (See Comments)    Allergic, per [facility]   Sumycin [Tetracycline] Other (See Comments)    Can't take due to drug reaction- Allergic, per [facility]    Allergies as of 10/31/2023       Reactions   Vioxx [rofecoxib] Shortness Of Breath   Dyazide [hydrochlorothiazide-triamterene] Other (See Comments)   Lowers blood pressure too much   Latex Swelling, Other (See Comments)   Allergic, per [facility]   Sulfa Antibiotics Nausea And Vomiting, Other (See Comments)   Allergic, per [facility]   Sumycin [tetracycline] Other (See Comments)   Can't take due to drug reaction- Allergic, per [facility]        Medication List        Accurate as of October 31, 2023  2:49 PM. If you have any questions, ask your nurse or doctor.          acetaminophen  325 MG tablet Commonly known as: TYLENOL  Take 650 mg by mouth every 6 (six) hours as needed for fever or mild pain. Give 2 tablet by mouth every 6 hours as needed for Pain   acetic acid 0.25 % irrigation Irrigate with  1 Application as directed 2 (two) times daily.   carvedilol  3.125 MG tablet Commonly known as: COREG  Take 1 tablet (3.125 mg total) by mouth 2 (two) times daily with a meal.   dextromethorphan-guaiFENesin 30-600 MG 12hr tablet Commonly known as: MUCINEX DM Take 1 tablet by mouth every 6 (six) hours as needed (congestion).   docusate sodium  100 MG capsule Commonly known as: COLACE Take 100 mg by mouth as needed for mild constipation.   ipratropium-albuterol  0.5-2.5 (3) MG/3ML Soln Commonly known as: DUONEB Take 3 mLs by nebulization as directed. Inhale orally one time only for cough/wheezing for 1 day   ketoconazole 2 % shampoo Commonly known as: NIZORAL Apply 1 Application topically 2 (two) times a week. Monday and Thursday   levofloxacin  500 MG tablet Commonly known as: LEVAQUIN  Take 500 mg by mouth daily.   mirtazapine  7.5 MG tablet Commonly known as: REMERON  Take 7.5 mg by mouth daily.    mirtazapine  15 MG tablet Commonly known as: REMERON  Take 15 mg by mouth daily.   Morphine  Sulfate (Concentrate) 5 MG/0.25ML Soln Take 0.25 mLs by mouth every 8 (eight) hours as needed.   Nyamyc powder Generic drug: nystatin Apply 1 Application topically daily as needed.   nystatin cream Commonly known as: MYCOSTATIN Apply 1 Application topically as needed.   Orajel 2X Toothache & Gum 20-0.26 % Gel Generic drug: Benzocaine-Menthol  Use as directed 1 Dose in the mouth or throat every 6 (six) hours as needed (Tooth Pain).   oxybutynin  5 MG tablet Commonly known as: DITROPAN  Take 5 mg by mouth 2 (two) times daily.   potassium chloride  10 MEQ CR capsule Commonly known as: MICRO-K  Take 10 mEq by mouth daily.   pramipexole  0.5 MG tablet Commonly known as: MIRAPEX  Take 0.5 mg by mouth at bedtime.   pramipexole  0.25 MG tablet Commonly known as: MIRAPEX  Take 0.25 mg by mouth daily. As needed for restless legs, see other bedtime listing   ProAir  RespiClick 108 (90 Base) MCG/ACT Aepb Generic drug: Albuterol  Sulfate Inhale 2 puffs into the lungs daily as needed (shortness of breath or wheezing).   albuterol  1.25 MG/3ML nebulizer solution Commonly known as: ACCUNEB  Take 1 ampule by nebulization 2 (two) times daily.   albuterol  1.25 MG/3ML nebulizer solution Commonly known as: ACCUNEB  Take 1 ampule by nebulization every 8 (eight) hours as needed for shortness of breath.        Review of Systems  Constitutional:  Negative for fever.  Respiratory:  Negative for cough, shortness of breath and wheezing.   Cardiovascular:  Negative for chest pain.  Gastrointestinal:  Negative for abdominal pain, blood in stool, constipation, diarrhea, nausea and vomiting.  Genitourinary:  Negative for dysuria.  Musculoskeletal:  Positive for arthralgias.  Neurological:  Negative for dizziness.  Psychiatric/Behavioral:  Negative for confusion.     Immunization History  Administered Date(s)  Administered    sv, Bivalent, Protein Subunit Rsvpref,pf (Abrysvo) 04/26/2022   Fluzone Influenza virus vaccine,trivalent (IIV3), split virus 01/15/2017, 01/08/2018, 12/09/2018   Influenza Split 01/06/2014   Influenza Whole 01/07/2012, 01/06/2013   Influenza, High Dose Seasonal PF 12/25/2015, 01/20/2017, 01/08/2023   Influenza,inj,Quad PF,6+ Mos 12/21/2014   Influenza-Unspecified 12/09/2018, 01/18/2020, 01/24/2021, 01/30/2022   Moderna SARS-COV2 Booster Vaccination 04/02/2021   Moderna Sars-Covid-2 Vaccination 04/12/2019, 06/05/2019, 02/15/2020, 09/05/2020   PFIZER(Purple Top)SARS-COV-2 Vaccination 12/26/2020   PPD Test 02/16/2022   Pneumococcal Conjugate-13 02/01/2014   Pneumococcal Polysaccharide-23 12/20/1992, 01/15/2000, 04/08/2004, 06/08/2004   Td 04/08/2002, 04/22/2002   Tdap 04/09/2011, 08/17/2015  Unspecified SARS-COV-2 Vaccination 01/22/2023   Zoster Recombinant(Shingrix) 05/29/2005, 08/27/2017, 06/13/2022   Zoster, Live 04/08/2008, 05/29/2014, 05/28/2017   Zoster, Unspecified 05/29/2005   Pertinent  Health Maintenance Due  Topic Date Due   INFLUENZA VACCINE  11/07/2023   DEXA SCAN  Completed   MAMMOGRAM  Discontinued      02/15/2022    3:00 PM 02/15/2022    9:00 PM 02/16/2022    8:00 AM 03/06/2022    7:25 AM 05/20/2022    3:42 PM  Fall Risk  Falls in the past year?     0  Was there an injury with Fall?     0  Fall Risk Category Calculator     0  (RETIRED) Patient Fall Risk Level Low fall risk  Moderate fall risk  Moderate fall risk  High fall risk    Patient at Risk for Falls Due to     No Fall Risks  Fall risk Follow up     Falls evaluation completed     Data saved with a previous flowsheet row definition   Functional Status Survey:    Vitals:   10/31/23 1446  BP: (!) 152/86  Pulse: 64  Resp: 18  Temp: (!) 97.1 F (36.2 C)  SpO2: 98%  Weight: 168 lb 12.8 oz (76.6 kg)  Height: 5' 2 (1.575 m)   Body mass index is 30.87 kg/m. Physical  Exam Constitutional:      Appearance: Normal appearance.  HENT:     Head: Normocephalic and atraumatic.  Cardiovascular:     Rate and Rhythm: Normal rate and regular rhythm.  Pulmonary:     Effort: Pulmonary effort is normal. No respiratory distress.     Breath sounds: Normal breath sounds. No wheezing.  Abdominal:     General: Bowel sounds are normal. There is no distension.     Tenderness: There is no abdominal tenderness. There is no guarding or rebound.     Comments:    Musculoskeletal:     Comments: No lower extremity swelling  Bil lower extremity erythema noted  No new change No calf tenderness  Neurological:     Mental Status: She is alert. Mental status is at baseline.     Motor: No weakness.     Labs reviewed: Recent Labs    04/01/23 0000  NA 144  K 3.9  CL 107  CO2 30*  BUN 37*  CREATININE 0.9  CALCIUM 9.5   Recent Labs    04/01/23 0000  AST 21  ALT 16  ALKPHOS 124  ALBUMIN  3.8   Recent Labs    04/01/23 0000  WBC 5.4  NEUTROABS 3,175.00  HGB 12.5  HCT 40  PLT 167   Lab Results  Component Value Date   TSH 1.198 02/15/2022   No results found for: HGBA1C Lab Results  Component Value Date   CHOL 147 09/27/2021   HDL 48 09/27/2021   LDLCALC 78 09/27/2021   TRIG 131 09/27/2021   CHOLHDL 3.6 06/27/2015    Significant Diagnostic Results in last 30 days:  No results found.  Assessment/Plan  Lower extremity pain  C/o bil lower extremity pain  No increased swelling  Lungs clear to delanna No change in redness as per nursing staff Cont with lidocaine  patch Cont with morphine  prn for pain   Hospice care patient  Cont with supportive care Cont with morphine  prn for pain

## 2023-11-03 ENCOUNTER — Encounter: Payer: Self-pay | Admitting: Sports Medicine

## 2023-11-10 ENCOUNTER — Encounter: Payer: Self-pay | Admitting: Nurse Practitioner

## 2023-11-10 ENCOUNTER — Non-Acute Institutional Stay (SKILLED_NURSING_FACILITY): Payer: Self-pay | Admitting: Nurse Practitioner

## 2023-11-10 DIAGNOSIS — I10 Essential (primary) hypertension: Secondary | ICD-10-CM | POA: Diagnosis not present

## 2023-11-10 DIAGNOSIS — J439 Emphysema, unspecified: Secondary | ICD-10-CM

## 2023-11-10 DIAGNOSIS — G2581 Restless legs syndrome: Secondary | ICD-10-CM

## 2023-11-10 DIAGNOSIS — F331 Major depressive disorder, recurrent, moderate: Secondary | ICD-10-CM

## 2023-11-10 DIAGNOSIS — I471 Supraventricular tachycardia, unspecified: Secondary | ICD-10-CM

## 2023-11-10 DIAGNOSIS — R569 Unspecified convulsions: Secondary | ICD-10-CM

## 2023-11-10 DIAGNOSIS — F323 Major depressive disorder, single episode, severe with psychotic features: Secondary | ICD-10-CM

## 2023-11-10 DIAGNOSIS — Z9359 Other cystostomy status: Secondary | ICD-10-CM

## 2023-11-10 NOTE — Progress Notes (Addendum)
 Location:   SNF [facility] Room Number: [id number] Place of Service:  SNF (31) Provider: Larwance Joyceann Kruser NP  Seth Friedlander X, NP  Patient Care Team: Ranjit Ashurst X, NP as PCP - General (Internal Medicine) [name], MD as Consulting Physician (Ophthalmology) [name], MD as Consulting Physician (Cardiology) [name], MD as Consulting Physician (Dermatology) [name], MD as Consulting Physician (Orthopedic Surgery) [name], MD (Inactive) as Consulting Physician (Orthopedic Surgery) [name], MD as Consulting Physician (Orthopedic Surgery) [name] Georgio Hattabaugh X, NP as Nurse Practitioner (Nurse Practitioner) [name], MD (Inactive) as Consulting Physician (Neurology)  Extended Emergency Contact Information Primary Emergency Contact: [name],[name] Address: [address]          [address]          [place], [place] [address] [place] Home Phone: [phone] Relation: Sister Secondary Emergency Contact: [name],[name]  [place] Home Phone: [phone] Mobile Phone: [phone] Relation: Sister  Code Status:  DNR Goals of care: Advanced Directive information    12/18/2023   10:39 AM  Advanced Directives  Does Patient Have a Medical Advance Directive? Yes  Type of Estate agent of [place];Out of facility DNR (pink MOST or yellow form)  Does patient want to make changes to medical advance directive? No - Patient declined  Copy of Healthcare Power of Attorney in Chart? Yes - validated most recent copy scanned in chart (See row information)  Pre-existing out of facility DNR order (yellow form or pink MOST form) Pink MOST form placed in chart (order not valid for inpatient use)     Chief Complaint  Patient presents with   Medical Management of Chronic Issues    HPI:  Pt is a 89 y.o. female seen today for medical management of chronic diseases.   02/12/22 Obstructive nephritis,  underwent cystoscopy with right retrograde pyelogram, right ureteral stent placed, f/u Urology Dr. Alvaro, 03/06/22 cystoscopy with retrograde pyelogram, ureteroscopy and stent replacement, SPC exchange.                05/03/22 abd US  no renal or gallbladder stone or hydronephrosis. 05/02/22 Xray abd may represent ileus. Hx of Ureteral stone with hydronephrosis per CT abd                          05/13/22 Urology: R ureteral stone, sp R ureteroscopy stone free 03/2022, stent. SPC  urethral stricture, off Oxybutynin . Hx of Ureteral stone with hydronephrosis per CT abd             External hemorrhoids, placed on Colace             Hypokalemia, K 4.4 10/09/23, taking  Kcl             Anemia, Hgb 12.8 10/09/23             SVT, takes Coreg              BLE edema, mild,  off Furosemide .              Adult failure to thrive, under Hospice service, weight stable.              Suprapubic Catheter, f/u Urology, off Uribel and Oxybutynin , Hx of urethral stricture.              Depression, stable, on Mirtazapine , off Lorazepam   Dementia, under hospice service, prn Morphine  available to her.              Restless leg syndrome,  taking MiraPex               Hx of seizures, no active seizures, off meds.              COPD, on ProAir , Albuterol  neb, Mucinex, chronic cough             HTN, intermittent elevated Bp, asymptomatic, not on meds.      Past Medical History:  Diagnosis Date   Acute bronchitis 05/23/2011   Acute upper respiratory infections of unspecified site 05/23/2011   Arthritis    Chronic airway obstruction, not elsewhere classified 05/23/2011   Coronary artery disease    Disturbance of salivary secretion 01/31/2011   Dizziness and giddiness 01/31/2011   Dyspnea    Essential tremor 04/25/2014   External hemorrhoids without mention of complication 01/31/2011   Gait disorder 04/25/2014   GERD (gastroesophageal reflux disease)    History of kidney stones    Insomnia, unspecified  09/12/2011   Lumbago 01/31/2011   Major depressive disorder, single episode, unspecified 01/31/2011   Memory disorder 04/25/2014   Mitral valve disorders(424.0) 01/31/2011   Other and unspecified hyperlipidemia 01/31/2011   Other convulsions 01/31/2011   Other emphysema (HCC) 01/31/2011   Pain in joint, site unspecified 01/31/2011   Restless legs syndrome (RLS) 09/12/2011   Retinal detachment with retinal defect of right eye 2011   right eye twice   Seizure disorder (HCC)    Senile osteoporosis 01/31/2011   Spontaneous ecchymoses 01/31/2011   Squamous cell carcinoma of leg    Stiffness of joints, not elsewhere classified, multiple sites 01/31/2011   Unspecified essential hypertension 01/31/2011   Past Surgical History:  Procedure Laterality Date   ABDOMINAL HYSTERECTOMY  06/21/2003   TAH/BSO, omenectomy PSB resect, Stg IC cystadenofibroma   CHOLECYSTECTOMY  2005   Dr. [name]   CYSTOSCOPY W/ URETERAL STENT PLACEMENT Right 02/13/2022   Procedure: CYSTOSCOPY WITH RETROGRADE PYELOGRAM/URETERAL STENT PLACEMENT, AND SUPRAPUBIC TUBE EXCHANGE;  Surgeon: [name], MD;  Location: [facility];  Service: Urology;  Laterality: Right;   CYSTOSCOPY WITH RETROGRADE PYELOGRAM, URETEROSCOPY AND STENT PLACEMENT Right 03/06/2022   Procedure: CYSTOSCOPY WITH RETROGRADE PYELOGRAM, URETEROSCOPY AND STENT REPLACEMENT, SUPRAPUBIC CATHETER EXCHANGE;  Surgeon: [name], MD;  Location: [facility];  Service: Urology;  Laterality: Right;   ELBOW SURGERY Right 2008   broken   Dr. [name]   EYE SURGERY     HOLMIUM LASER APPLICATION Right 03/06/2022   Procedure: HOLMIUM LASER APPLICATION;  Surgeon: [name], MD;  Location: [facility];  Service: Urology;  Laterality: Right;   INTRAMEDULLARY (IM) NAIL INTERTROCHANTERIC Left 08/30/2021   Procedure: INTRAMEDULLARY (IM) NAIL INTERTROCHANTRIC;  Surgeon: [name], MD;  Location: [facility];  Service: Orthopedics;  Laterality: Left;   ORIF PATELLA Left 05/02/2020    Procedure: OPEN REDUCTION INTERNAL (ORIF) FIXATION LEFT PATELLA WITH MEDIAL AND LATERAL LIGAMENT REINFORCEMENTS;  Surgeon: [name], MD;  Location: [facility];  Service: Orthopedics;  Laterality: Left;   ORIF PATELLA Left 05/30/2020   Procedure: OPEN REDUCTION INTERNAL (ORIF) FIXATION PATELLA;  Surgeon: [name], MD;  Location: [facility];  Service: Orthopedics;  Laterality: Left;   RETINAL DETACHMENT SURGERY N/A    two   REVERSE SHOULDER ARTHROPLASTY Left 05/06/2019   Procedure: REVERSE SHOULDER ARTHROPLASTY;  Surgeon: Melita Drivers, MD;  Location: [facility];  Service: Orthopedics;  Laterality:  Left;    ROTATOR CUFF REPAIR Right 2012   Dr. Gerome   SQUAMOUS CELL CARCINOMA EXCISION Bilateral 2012, 8/14   [place] on legs   Dr. Jadine   TONSILLECTOMY  1941   VIDEO BRONCHOSCOPY WITH ENDOBRONCHIAL NAVIGATION N/A 11/29/2015   Procedure: VIDEO BRONCHOSCOPY WITH ENDOBRONCHIAL NAVIGATION;  Surgeon: Lamar GORMAN Chris, MD;  Location: [facility];  Service: Thoracic;  Laterality: N/A;    Allergies  Allergen Reactions   Vioxx [Rofecoxib] Shortness Of Breath   Dyazide [Hydrochlorothiazide-Triamterene] Other (See Comments)    Lowers blood pressure too much   Latex Swelling and Other (See Comments)    Allergic, per [facility]   Sulfa Antibiotics Nausea And Vomiting and Other (See Comments)    Allergic, per [facility]   Sumycin [Tetracycline] Other (See Comments)    Can't take due to drug reaction- Allergic, per [facility]    Allergies as of 11/10/2023       Reactions   Vioxx [rofecoxib] Shortness Of Breath   Dyazide [hydrochlorothiazide-triamterene] Other (See Comments)   Lowers blood pressure too much   Latex Swelling, Other (See Comments)   Allergic, per [facility]   Sulfa Antibiotics Nausea And Vomiting, Other (See Comments)   Allergic, per [facility]   Sumycin [tetracycline] Other (See Comments)   Can't take due to drug reaction- Allergic, per [facility]        Medication List        Accurate as of November 10, 2023 11:59 PM. If you have any questions, ask your nurse or doctor.          acetaminophen  325 MG tablet Commonly known as: TYLENOL  Take 650 mg by mouth every 6 (six) hours as needed for fever or mild pain. Give 2 tablet by mouth every 6 hours as needed for Pain   acetic acid 0.25 % irrigation Irrigate with 1 Application as directed 2 (two) times daily.   carvedilol  3.125 MG tablet Commonly known as: COREG  Take 1 tablet (3.125 mg total) by mouth 2 (two) times daily with a meal.   dextromethorphan-guaiFENesin 30-600 MG 12hr tablet Commonly known as: MUCINEX DM Take 1 tablet by mouth every 6 (six) hours as needed (congestion).   docusate sodium  100 MG capsule Commonly known as: COLACE Take 100 mg by mouth as needed for mild constipation.   ipratropium-albuterol  0.5-2.5 (3) MG/3ML Soln Commonly known as: DUONEB Take 3 mLs by nebulization as directed. Inhale orally one time only for cough/wheezing for 1 day   ketoconazole 2 % shampoo Commonly known as: NIZORAL Apply 1 Application topically 2 (two) times a week. Monday and Thursday   levofloxacin  500 MG tablet Commonly known as: LEVAQUIN  Take 500 mg by mouth daily.   mirtazapine  7.5 MG tablet Commonly known as: REMERON  Take 7.5 mg by mouth daily.   mirtazapine  15 MG tablet Commonly known as: REMERON  Take 15 mg by mouth daily.   Morphine  Sulfate (Concentrate) 5 MG/0.25ML Soln Take 0.25 mLs by mouth every 8 (eight) hours as needed.   Nyamyc powder Generic drug: nystatin Apply 1 Application topically daily as needed.   nystatin cream Commonly known as: MYCOSTATIN Apply 1 Application topically as needed.   Orajel 2X Toothache & Gum 20-0.26 % Gel Generic drug: Benzocaine-Menthol  Use as directed 1 Dose in the mouth or throat every 6 (six) hours as needed (Tooth Pain).   oxybutynin  5 MG tablet Commonly known as: DITROPAN  Take 5 mg by mouth 2 (two) times daily.   potassium chloride  10 MEQ CR capsule Commonly  known as: MICRO-K  Take 10 mEq by mouth daily.   pramipexole  0.5 MG tablet Commonly known as: MIRAPEX  Take 0.5 mg by mouth at bedtime.   pramipexole  0.25 MG tablet Commonly known as: MIRAPEX  Take 0.25 mg by mouth daily. As needed for restless legs, see other bedtime listing   ProAir  RespiClick 108 (90 Base) MCG/ACT Aepb Generic drug: Albuterol  Sulfate Inhale 2 puffs into the lungs daily as needed (shortness of breath or wheezing).   albuterol  1.25 MG/3ML nebulizer solution Commonly known as: ACCUNEB  Take 1 ampule by nebulization 2 (two) times daily.   albuterol  1.25 MG/3ML nebulizer solution Commonly known as: ACCUNEB  Take 1 ampule by nebulization every 8 (eight) hours as needed for shortness of breath.        Review of Systems  Constitutional:  Negative for appetite change, fatigue and fever.  HENT:  Positive for hearing loss. Negative for congestion and trouble swallowing.   Eyes:  Negative for visual disturbance.  Respiratory:  Positive for cough. Negative for shortness of breath.        DOE is chronic  Cardiovascular:  Positive for leg swelling.  Gastrointestinal:  Negative for abdominal pain and constipation.       Hemorrhoids from previous examination.   Genitourinary:  Positive for difficulty urinating.       [facility]  Musculoskeletal:  Positive for arthralgias and gait problem.       S/p left hip ORIF, ORIF of the left patella. Positional pain in the left knee sometimes.   Skin:  Negative for color change.       BLE discoloration, chronic venous insufficiency skin changes   Neurological:  Negative for seizures, weakness and headaches.       Memory lapses. Hx of seizures. RLS  Psychiatric/Behavioral:  Negative for behavioral problems and sleep disturbance. The patient is not nervous/anxious.     Immunization History  Administered Date(s) Administered    sv, Bivalent, Protein Subunit Rsvpref,pf (Abrysvo) 04/26/2022   Fluzone Influenza virus vaccine,trivalent (IIV3),  split virus 01/15/2017, 01/08/2018, 12/09/2018   INFLUENZA, HIGH DOSE SEASONAL PF 12/25/2015, 01/20/2017, 01/08/2023   Influenza Split 01/06/2014   Influenza Whole 01/07/2012, 01/06/2013   Influenza,inj,Quad PF,6+ Mos 12/21/2014   Influenza-Unspecified 12/09/2018, 01/18/2020, 01/24/2021, 01/30/2022   Moderna SARS-COV2 Booster Vaccination 04/02/2021   Moderna Sars-Covid-2 Vaccination 04/12/2019, 06/05/2019, 02/15/2020, 09/05/2020   PFIZER(Purple Top)SARS-COV-2 Vaccination 12/26/2020   PPD Test 02/16/2022   Pneumococcal Conjugate-13 02/01/2014   Pneumococcal Polysaccharide-23 12/20/1992, 01/15/2000, 04/08/2004, 06/08/2004   Td 04/08/2002, 04/22/2002   Tdap 04/09/2011, 08/17/2015   Unspecified SARS-COV-2 Vaccination 01/22/2023   Zoster Recombinant(Shingrix) 05/29/2005, 08/27/2017, 06/13/2022   Zoster, Live 04/08/2008, 05/29/2014, 05/28/2017   Zoster, Unspecified 05/29/2005   Pertinent  Health Maintenance Due  Topic Date Due   Influenza Vaccine  11/07/2023   DEXA SCAN  Completed   Mammogram  Discontinued      02/15/2022    3:00 PM 02/15/2022    9:00 PM 02/16/2022    8:00 AM 03/06/2022    7:25 AM 05/20/2022    3:42 PM  Fall Risk  Falls in the past year?     0  Was there an injury with Fall?     0  Fall Risk Category Calculator     0  (RETIRED) Patient Fall Risk Level Low fall risk  Moderate fall risk  Moderate fall risk  High fall risk    Patient at Risk for Falls Due to     No Fall Risks  Fall risk Follow up  Falls evaluation completed     Data saved with a previous flowsheet row definition   Functional Status Survey:    Vitals:   11/10/23 1341  BP: 127/84  Pulse: 81  Resp: 16  Temp: (!) 97.5 F (36.4 C)  SpO2: 94%  Weight: 169 lb 9.6 oz (76.9 kg)   Body mass index is 31.02 kg/m. Physical Exam Vitals and nursing note reviewed.  Constitutional:      Appearance: Normal appearance.  HENT:     Head: Normocephalic and atraumatic.     Nose: Nose normal.      Mouth/Throat:     Mouth: Mucous membranes are moist.     Pharynx: No posterior oropharyngeal erythema.  Eyes:     Extraocular Movements: Extraocular movements intact.     Conjunctiva/sclera: Conjunctivae normal.     Right eye: Right conjunctiva is not injected.     Left eye: Left conjunctiva is not injected.     Pupils: Pupils are equal, round, and reactive to light.  Neck:     Comments: Left adam's apple bony aspect, sometimes feels soreness on palpation is chronic, no noted lymph nodes  Cardiovascular:     Rate and Rhythm: Normal rate and regular rhythm.     Heart sounds: Murmur heard.     Comments: PD pulses are not felt from previous examination.  Pulmonary:     Effort: Pulmonary effort is normal.     Breath sounds: Rales present.     Comments: Decreased air entry to both lungs.  Bibasilar rales.  Abdominal:     General: Bowel sounds are normal.     Palpations: Abdomen is soft.     Tenderness: There is no abdominal tenderness.     Comments: Mid abd surgical scar. SPC  Genitourinary:    Comments: SPC. External hemorrhoids x2, no injury, bleeding, or pain  Musculoskeletal:     Cervical back: Normal range of motion and neck supple.     Right lower leg: Edema present.     Left lower leg: Edema present.     Comments: Decreased overhead ROM of the left shoulder. Left knee s/p ORIF of the patella fx.  Trace edema BLE is chronic  Skin:    General: Skin is warm and dry.     Findings: Lesion present.     Comments: BLE discoloration, chronic venous insufficiency skin changes. Left shin skin lesions-opted out workup or tx.      Neurological:     General: No focal deficit present.     Mental Status: She is alert. Mental status is at baseline.     Gait: Gait abnormal.  Psychiatric:        Mood and Affect: Mood normal.        Behavior: Behavior normal.     Labs reviewed: Recent Labs    04/01/23 0000 10/09/23 1811  NA 144 142  K 3.9 4.4  CL 107 105  CO2 30* 30*  BUN 37*  13  CREATININE 0.9 0.8  CALCIUM 9.5 9.0   Recent Labs    04/01/23 0000 10/09/23 1811  AST 21 14  ALT 16 9  ALKPHOS 124  --   ALBUMIN  3.8 3.5   Recent Labs    04/01/23 0000 10/09/23 1811  WBC 5.4 5.4  NEUTROABS 3,175.00 2,857.00  HGB 12.5 12.8  HCT 40 43  PLT 167 308   Lab Results  Component Value Date   TSH 1.198 02/15/2022   No results found for: HGBA1C  Lab Results  Component Value Date   CHOL 147 09/27/2021   HDL 48 09/27/2021   LDLCALC 78 09/27/2021   TRIG 131 09/27/2021   CHOLHDL 3.6 06/27/2015    Significant Diagnostic Results in last 30 days:  No results found.  Assessment/Plan  Essential hypertension 02/12/22 Obstructive nephritis, underwent cystoscopy with right retrograde pyelogram, right ureteral stent placed, f/u Urology Dr. Alvaro, 03/06/22 cystoscopy with retrograde pyelogram, ureteroscopy and stent replacement, SPC exchange.                05/03/22 abd US  no renal or gallbladder stone or hydronephrosis. 05/02/22 Xray abd may represent ileus. Hx of Ureteral stone with hydronephrosis per CT abd                          05/13/22 Urology: R ureteral stone, sp R ureteroscopy stone free 03/2022, stent. SPC  urethral stricture, off Oxybutynin . Hx of Ureteral stone with hydronephrosis per CT abd             External hemorrhoids, placed on Colace             Hypokalemia, K 4.4 10/09/23, taking  Kcl             Anemia, Hgb 12.8 10/09/23             SVT, takes Coreg              BLE edema, mild,  off Furosemide .              Adult failure to thrive, under Hospice service, weight stable.              Suprapubic Catheter, f/u Urology, off Uribel and Oxybutynin , Hx of urethral stricture.              Depression, stable, on Mirtazapine , off Lorazepam              Dementia, under hospice service, prn Morphine  available to her.              Restless leg syndrome,  taking MiraPex               Hx of seizures, no active seizures, off meds.              COPD, on ProAir ,  Albuterol  neb, Mucinex, chronic cough             HTN, intermittent elevated Bp, asymptomatic, not on meds.     COPD (chronic obstructive pulmonary disease) (HCC) on ProAir , Albuterol  neb, Mucinex, chronic cough  Seizure (HCC)  no active seizures, off meds.   Restless legs syndrome (RLS) Stable,  taking MiraPex    Major depressive disorder, recurrent  under hospice service, prn Morphine  available to her.   Depression, psychotic (HCC)  stable, on Mirtazapine , off Lorazepam   Suprapubic catheter [facility]) f/u Urology, off Uribel and Oxybutynin , Hx of urethral stricture.   SVT (supraventricular tachycardia) Heart rates is in control, takes Coreg    Family/ staff Communication: Plan of care reviewed with the patient and the charge nurse  Labs/tests ordered: None

## 2023-11-10 NOTE — Assessment & Plan Note (Signed)
 stable, on Mirtazapine , off Lorazepam 

## 2023-11-10 NOTE — Assessment & Plan Note (Signed)
 f/u Urology, off Uribel and Oxybutynin , Hx of urethral stricture.

## 2023-11-10 NOTE — Assessment & Plan Note (Signed)
 Heart rates is in control, takes Coreg 

## 2023-11-10 NOTE — Assessment & Plan Note (Signed)
 under hospice service, prn Morphine available to her.

## 2023-11-10 NOTE — Assessment & Plan Note (Signed)
 no active seizures, off meds.

## 2023-11-10 NOTE — Assessment & Plan Note (Signed)
 on ProAir , Albuterol  neb, Mucinex, chronic cough

## 2023-11-10 NOTE — Assessment & Plan Note (Signed)
 02/12/22 Obstructive nephritis, underwent cystoscopy with right retrograde pyelogram, right ureteral stent placed, f/u Urology Dr. Alvaro, 03/06/22 cystoscopy with retrograde pyelogram, ureteroscopy and stent replacement, SPC exchange.                05/03/22 abd US  no renal or gallbladder stone or hydronephrosis. 05/02/22 Xray abd may represent ileus. Hx of Ureteral stone with hydronephrosis per CT abd                          05/13/22 Urology: R ureteral stone, sp R ureteroscopy stone free 03/2022, stent. SPC  urethral stricture, off Oxybutynin . Hx of Ureteral stone with hydronephrosis per CT abd             External hemorrhoids, placed on Colace             Hypokalemia, K 4.4 10/09/23, taking  Kcl             Anemia, Hgb 12.8 10/09/23             SVT, takes Coreg              BLE edema, mild,  off Furosemide .              Adult failure to thrive, under Hospice service, weight stable.              Suprapubic Catheter, f/u Urology, off Uribel and Oxybutynin , Hx of urethral stricture.              Depression, stable, on Mirtazapine , off Lorazepam              Dementia, under hospice service, prn Morphine  available to her.              Restless leg syndrome,  taking MiraPex               Hx of seizures, no active seizures, off meds.              COPD, on ProAir , Albuterol  neb, Mucinex, chronic cough             HTN, intermittent elevated Bp, asymptomatic, not on meds.

## 2023-11-10 NOTE — Assessment & Plan Note (Signed)
 Stable,  taking MiraPex 

## 2023-11-25 DIAGNOSIS — F331 Major depressive disorder, recurrent, moderate: Secondary | ICD-10-CM | POA: Diagnosis not present

## 2023-11-25 DIAGNOSIS — F411 Generalized anxiety disorder: Secondary | ICD-10-CM | POA: Diagnosis not present

## 2023-12-18 ENCOUNTER — Non-Acute Institutional Stay (SKILLED_NURSING_FACILITY): Payer: Self-pay | Admitting: Sports Medicine

## 2023-12-18 ENCOUNTER — Encounter: Payer: Self-pay | Admitting: Sports Medicine

## 2023-12-18 DIAGNOSIS — F5101 Primary insomnia: Secondary | ICD-10-CM

## 2023-12-18 DIAGNOSIS — F0390 Unspecified dementia without behavioral disturbance: Secondary | ICD-10-CM

## 2023-12-18 DIAGNOSIS — G2581 Restless legs syndrome: Secondary | ICD-10-CM | POA: Diagnosis not present

## 2023-12-18 DIAGNOSIS — Z515 Encounter for palliative care: Secondary | ICD-10-CM

## 2023-12-18 DIAGNOSIS — Z9359 Other cystostomy status: Secondary | ICD-10-CM

## 2023-12-18 NOTE — Progress Notes (Signed)
 Location:  [name] Room Number: 3 A Place of Service:  SNF (31) Provider:  Sherlynn Albert, MD   Mast, Man X, NP  Patient Care Team: Mast, Man X, NP as PCP - General (Internal Medicine) [name], MD as Consulting Physician (Ophthalmology) [name], MD as Consulting Physician (Cardiology) [name], MD as Consulting Physician (Dermatology) [name], MD as Consulting Physician (Orthopedic Surgery) [name], MD (Inactive) as Consulting Physician (Orthopedic Surgery) [name], MD as Consulting Physician (Orthopedic Surgery) [name] Mast, Man X, NP as Nurse Practitioner (Nurse Practitioner) [name], MD (Inactive) as Consulting Physician (Neurology)  Extended Emergency Contact Information Primary Emergency Contact: [name],[name] Address: [address]          [address]          [place], [place] [address] [place] Home Phone: [phone] Relation: Sister Secondary Emergency Contact: [name],[name]  [place] Home Phone: [phone] Mobile Phone: [phone] Relation: Sister  Code Status:  DNR  Goals of care: Advanced Directive information    12/18/2023   10:39 AM  Advanced Directives  Does Patient Have a Medical Advance Directive? Yes  Type of Estate agent of [place];Out of facility DNR (pink MOST or yellow form)  Does patient want to make changes to medical advance directive? No - Patient declined  Copy of Healthcare Power of Attorney in Chart? Yes - validated most recent copy scanned in chart (See row information)  Pre-existing out of facility DNR order (yellow form or pink MOST form) Pink MOST form placed in chart (order not valid for inpatient use)     Chief Complaint  Patient presents with   Medical Management of Chronic Issues    Routine visit                               HOSPICE CARE PATIENT   HPI:  Pt is a 89 y.o.  female with PMH of obstructive uropathy s/p SPC, anemia, SVT, venous stasis, failure to thrive, depression, dementia, restless leg syndrome  is seen today for medical management of chronic diseases.   Pt seen and examined in her room  She is pleasant and comfortable , does not appear to be in distress Pt is wheel chair dependent She does to dining room to eat her meals As per nursing staff no acute concerns    Past Medical History:  Diagnosis Date   Acute bronchitis 05/23/2011   Acute upper respiratory infections of unspecified site 05/23/2011   Arthritis    Chronic airway obstruction, not elsewhere classified 05/23/2011   Coronary artery disease    Disturbance of salivary secretion 01/31/2011   Dizziness and giddiness 01/31/2011   Dyspnea    Essential tremor 04/25/2014   External hemorrhoids without mention of complication 01/31/2011   Gait disorder 04/25/2014   GERD (gastroesophageal reflux disease)    History of kidney stones    Insomnia, unspecified 09/12/2011   Lumbago 01/31/2011   Major depressive disorder, single episode, unspecified 01/31/2011   Memory disorder 04/25/2014   Mitral valve disorders(424.0) 01/31/2011   Other and unspecified hyperlipidemia 01/31/2011   Other convulsions 01/31/2011   Other emphysema (HCC) 01/31/2011   Pain in joint, site unspecified 01/31/2011   Restless legs syndrome (RLS) 09/12/2011   Retinal detachment with retinal defect of right eye 2011   right eye twice   Seizure disorder (HCC)  Senile osteoporosis 01/31/2011   Spontaneous ecchymoses 01/31/2011   Squamous cell carcinoma of leg    Stiffness of joints, not elsewhere classified, multiple sites 01/31/2011   Unspecified essential hypertension 01/31/2011   Past Surgical History:  Procedure Laterality Date   ABDOMINAL HYSTERECTOMY  06/21/2003   TAH/BSO, omenectomy PSB resect, Stg IC cystadenofibroma   CHOLECYSTECTOMY  2005   Dr. [name]   CYSTOSCOPY W/ URETERAL STENT PLACEMENT Right  02/13/2022   Procedure: CYSTOSCOPY WITH RETROGRADE PYELOGRAM/URETERAL STENT PLACEMENT, AND SUPRAPUBIC TUBE EXCHANGE;  Surgeon: [name], MD;  Location: [facility];  Service: Urology;  Laterality: Right;   CYSTOSCOPY WITH RETROGRADE PYELOGRAM, URETEROSCOPY AND STENT PLACEMENT Right 03/06/2022   Procedure: CYSTOSCOPY WITH RETROGRADE PYELOGRAM, URETEROSCOPY AND STENT REPLACEMENT, SUPRAPUBIC CATHETER EXCHANGE;  Surgeon: [name], MD;  Location: [facility];  Service: Urology;  Laterality: Right;   ELBOW SURGERY Right 2008   broken   Dr. [name]   EYE SURGERY     HOLMIUM LASER APPLICATION Right 03/06/2022   Procedure: HOLMIUM LASER APPLICATION;  Surgeon: [name], MD;  Location: [facility];  Service: Urology;  Laterality: Right;   INTRAMEDULLARY (IM) NAIL INTERTROCHANTERIC Left 08/30/2021   Procedure: INTRAMEDULLARY (IM) NAIL INTERTROCHANTRIC;  Surgeon: [name], MD;  Location: [facility];  Service: Orthopedics;  Laterality: Left;   ORIF PATELLA Left 05/02/2020   Procedure: OPEN REDUCTION INTERNAL (ORIF) FIXATION LEFT PATELLA WITH MEDIAL AND LATERAL LIGAMENT REINFORCEMENTS;  Surgeon: [name], MD;  Location: [facility];  Service: Orthopedics;  Laterality: Left;   ORIF PATELLA Left 05/30/2020   Procedure: OPEN REDUCTION INTERNAL (ORIF) FIXATION PATELLA;  Surgeon: [name], MD;  Location: [facility];  Service: Orthopedics;  Laterality: Left;   RETINAL DETACHMENT SURGERY N/A    two   REVERSE SHOULDER ARTHROPLASTY Left 05/06/2019   Procedure: REVERSE SHOULDER ARTHROPLASTY;  Surgeon: Melita Drivers, MD;  Location: [facility];  Service: Orthopedics;  Laterality: Left;    ROTATOR CUFF REPAIR Right 2012   Dr. Gerome   SQUAMOUS CELL CARCINOMA EXCISION Bilateral 2012, 8/14   [place] on legs   Dr. Jadine   TONSILLECTOMY  1941   VIDEO BRONCHOSCOPY WITH ENDOBRONCHIAL NAVIGATION N/A 11/29/2015   Procedure: VIDEO BRONCHOSCOPY WITH ENDOBRONCHIAL NAVIGATION;  Surgeon: Lamar GORMAN Chris, MD;   Location: [facility];  Service: Thoracic;  Laterality: N/A;    Allergies  Allergen Reactions   Vioxx [Rofecoxib] Shortness Of Breath   Dyazide [Hydrochlorothiazide-Triamterene] Other (See Comments)    Lowers blood pressure too much   Latex Swelling and Other (See Comments)    Allergic, per [facility]   Sulfa Antibiotics Nausea And Vomiting and Other (See Comments)    Allergic, per [facility]   Sumycin [Tetracycline] Other (See Comments)    Can't take due to drug reaction- Allergic, per [facility]    Allergies as of 12/18/2023       Reactions   Vioxx [rofecoxib] Shortness Of Breath   Dyazide [hydrochlorothiazide-triamterene] Other (See Comments)   Lowers blood pressure too much   Latex Swelling, Other (See Comments)   Allergic, per [facility]   Sulfa Antibiotics Nausea And Vomiting, Other (See Comments)   Allergic, per [facility]   Sumycin [tetracycline] Other (See Comments)   Can't take due to drug reaction- Allergic, per [facility]        Medication List        Accurate as of December 18, 2023 10:39 AM. If you have any questions, ask your nurse or doctor.  acetaminophen  325 MG tablet Commonly known as: TYLENOL  Take 650 mg by mouth every 6 (six) hours as needed for fever or mild pain. Give 2 tablet by mouth every 6 hours as needed for Pain   acetic acid 0.25 % irrigation Irrigate with 1 Application as directed 2 (two) times daily.   carvedilol  3.125 MG tablet Commonly known as: COREG  Take 1 tablet (3.125 mg total) by mouth 2 (two) times daily with a meal.   dextromethorphan-guaiFENesin 30-600 MG 12hr tablet Commonly known as: MUCINEX DM Take 1 tablet by mouth every 6 (six) hours as needed (congestion).   docusate sodium  100 MG capsule Commonly known as: COLACE Take 100 mg by mouth as needed for mild constipation.   HM Lidocaine  Patch 4 % Generic drug: lidocaine  Place 1 patch onto the skin daily.   ipratropium-albuterol  0.5-2.5 (3) MG/3ML Soln Commonly known as: DUONEB Take 3  mLs by nebulization as directed. Inhale orally one time only for cough/wheezing for 1 day   ketoconazole 2 % shampoo Commonly known as: NIZORAL Apply 1 Application topically 2 (two) times a week. Monday and Thursday   levofloxacin  500 MG tablet Commonly known as: LEVAQUIN  Take 500 mg by mouth daily.   mirtazapine  7.5 MG tablet Commonly known as: REMERON  Take 7.5 mg by mouth daily.   mirtazapine  15 MG tablet Commonly known as: REMERON  Take 15 mg by mouth daily.   Morphine  Sulfate (Concentrate) 5 MG/0.25ML Soln Take 0.25 mLs by mouth every 8 (eight) hours as needed.   Nyamyc powder Generic drug: nystatin Apply 1 Application topically daily as needed.   nystatin cream Commonly known as: MYCOSTATIN Apply 1 Application topically as needed.   Orajel 2X Toothache & Gum 20-0.26 % Gel Generic drug: Benzocaine-Menthol  Use as directed 1 Dose in the mouth or throat every 6 (six) hours as needed (Tooth Pain).   oxybutynin  5 MG tablet Commonly known as: DITROPAN  Take 5 mg by mouth 2 (two) times daily.   potassium chloride  10 MEQ CR capsule Commonly known as: MICRO-K  Take 10 mEq by mouth daily.   pramipexole  0.5 MG tablet Commonly known as: MIRAPEX  Take 0.5 mg by mouth at bedtime.   pramipexole  0.25 MG tablet Commonly known as: MIRAPEX  Take 0.25 mg by mouth daily. As needed for restless legs, see other bedtime listing   ProAir  RespiClick 108 (90 Base) MCG/ACT Aepb Generic drug: Albuterol  Sulfate Inhale 2 puffs into the lungs daily as needed (shortness of breath or wheezing).   albuterol  1.25 MG/3ML nebulizer solution Commonly known as: ACCUNEB  Take 1 ampule by nebulization 2 (two) times daily.   albuterol  1.25 MG/3ML nebulizer solution Commonly known as: ACCUNEB  Take 1 ampule by nebulization every 8 (eight) hours as needed for shortness of breath.        Review of Systems  Constitutional:  Negative for fever.  Respiratory:  Negative for cough and shortness of  breath.   Cardiovascular:  Negative for chest pain and leg swelling.  Gastrointestinal:  Negative for abdominal pain, blood in stool, constipation and diarrhea.  Genitourinary:  Negative for dysuria.  Neurological:  Negative for dizziness.    Immunization History  Administered Date(s) Administered    sv, Bivalent, Protein Subunit Rsvpref,pf [place]) 04/26/2022   Fluzone Influenza virus vaccine,trivalent (IIV3), split virus 01/15/2017, 01/08/2018, 12/09/2018   INFLUENZA, HIGH DOSE SEASONAL PF 12/25/2015, 01/20/2017, 01/08/2023   Influenza Split 01/06/2014   Influenza Whole 01/07/2012, 01/06/2013   Influenza,inj,Quad PF,6+ Mos 12/21/2014   Influenza-Unspecified 12/09/2018, 01/18/2020, 01/24/2021, 01/30/2022   Moderna  SARS-COV2 Booster Vaccination 04/02/2021   Moderna Sars-Covid-2 Vaccination 04/12/2019, 06/05/2019, 02/15/2020, 09/05/2020   PFIZER(Purple Top)SARS-COV-2 Vaccination 12/26/2020   PPD Test 02/16/2022   Pneumococcal Conjugate-13 02/01/2014   Pneumococcal Polysaccharide-23 12/20/1992, 01/15/2000, 04/08/2004, 06/08/2004   Td 04/08/2002, 04/22/2002   Tdap 04/09/2011, 08/17/2015   Unspecified SARS-COV-2 Vaccination 01/22/2023   Zoster Recombinant(Shingrix) 05/29/2005, 08/27/2017, 06/13/2022   Zoster, Live 04/08/2008, 05/29/2014, 05/28/2017   Zoster, Unspecified 05/29/2005   Pertinent  Health Maintenance Due  Topic Date Due   Influenza Vaccine  11/07/2023   DEXA SCAN  Completed   Mammogram  Discontinued      02/15/2022    3:00 PM 02/15/2022    9:00 PM 02/16/2022    8:00 AM 03/06/2022    7:25 AM 05/20/2022    3:42 PM  Fall Risk  Falls in the past year?     0  Was there an injury with Fall?     0  Fall Risk Category Calculator     0  (RETIRED) Patient Fall Risk Level Low fall risk  Moderate fall risk  Moderate fall risk  High fall risk    Patient at Risk for Falls Due to     No Fall Risks  Fall risk Follow up     Falls evaluation completed     Data saved with a  previous flowsheet row definition   Functional Status Survey:    Vitals:   12/18/23 1030  BP: (!) 140/90  Pulse: 97  Resp: 20  Temp: (!) 97.3 F (36.3 C)  SpO2: 91%  Weight: 169 lb 11.2 oz (77 kg)  Height: 5' 2 (1.575 m)   Body mass index is 31.04 kg/m. Physical Exam Constitutional:      Appearance: Normal appearance.  HENT:     Head: Normocephalic and atraumatic.  Cardiovascular:     Rate and Rhythm: Normal rate and regular rhythm.  Pulmonary:     Effort: Pulmonary effort is normal. No respiratory distress.     Breath sounds: Normal breath sounds. No wheezing.  Abdominal:     General: Bowel sounds are normal. There is no distension.     Tenderness: There is no abdominal tenderness. There is no guarding or rebound.     Comments:    Musculoskeletal:        General: No swelling.  Neurological:     Mental Status: She is alert. Mental status is at baseline.     Motor: No weakness.     Labs reviewed: Recent Labs    04/01/23 0000 10/09/23 1811  NA 144 142  K 3.9 4.4  CL 107 105  CO2 30* 30*  BUN 37* 13  CREATININE 0.9 0.8  CALCIUM 9.5 9.0   Recent Labs    04/01/23 0000 10/09/23 1811  AST 21 14  ALT 16 9  ALKPHOS 124  --   ALBUMIN  3.8 3.5   Recent Labs    04/01/23 0000 10/09/23 1811  WBC 5.4 5.4  NEUTROABS 3,175.00 2,857.00  HGB 12.5 12.8  HCT 40 43  PLT 167 308   Lab Results  Component Value Date   TSH 1.198 02/15/2022   No results found for: HGBA1C Lab Results  Component Value Date   CHOL 147 09/27/2021   HDL 48 09/27/2021   LDLCALC 78 09/27/2021   TRIG 131 09/27/2021   CHOLHDL 3.6 06/27/2015    Significant Diagnostic Results in last 30 days:  No results found.  Assessment/Plan  Hospice care patient  Major Neurocognitive disorder  No agitation or behavioral problems Cont with supportive care Cont with morphine  prn   HTN  Cont with coreg    Restless leg syndrome  Cont with pramiprexole  Insomnia Cont with rameron    Supra pubic catheter in place Catheter care

## 2023-12-22 ENCOUNTER — Encounter: Payer: Self-pay | Admitting: Sports Medicine

## 2023-12-30 DIAGNOSIS — F411 Generalized anxiety disorder: Secondary | ICD-10-CM | POA: Diagnosis not present

## 2023-12-30 DIAGNOSIS — F331 Major depressive disorder, recurrent, moderate: Secondary | ICD-10-CM | POA: Diagnosis not present

## 2024-01-08 ENCOUNTER — Non-Acute Institutional Stay (SKILLED_NURSING_FACILITY): Payer: Self-pay | Admitting: Nurse Practitioner

## 2024-01-08 DIAGNOSIS — G2581 Restless legs syndrome: Secondary | ICD-10-CM

## 2024-01-08 DIAGNOSIS — J439 Emphysema, unspecified: Secondary | ICD-10-CM

## 2024-01-08 DIAGNOSIS — F323 Major depressive disorder, single episode, severe with psychotic features: Secondary | ICD-10-CM | POA: Diagnosis not present

## 2024-01-08 DIAGNOSIS — F0390 Unspecified dementia without behavioral disturbance: Secondary | ICD-10-CM

## 2024-01-08 DIAGNOSIS — I10 Essential (primary) hypertension: Secondary | ICD-10-CM

## 2024-01-08 DIAGNOSIS — R569 Unspecified convulsions: Secondary | ICD-10-CM

## 2024-01-08 NOTE — Assessment & Plan Note (Signed)
 under hospice service, prn Morphine available to her.

## 2024-01-08 NOTE — Assessment & Plan Note (Signed)
 on ProAir , Albuterol  neb, Mucinex, chronic cough

## 2024-01-08 NOTE — Assessment & Plan Note (Signed)
 the patient stated she sleeps all the time, GDR of  Mirtazapine  7.5mg /15mg  every day,  off Lorazepam 

## 2024-01-08 NOTE — Progress Notes (Signed)
 [facility] Room Number: 029-A Place of Service:  SNF (31) Provider:  DNR   [name], NP  Patient Care Team: [name], NP as PCP - General (Internal Medicine) [name], MD as Consulting Physician (Ophthalmology) [name], MD as Consulting Physician (Cardiology) [name], MD as Consulting Physician (Dermatology) [name], MD as Consulting Physician (Orthopedic Surgery) [name], MD (Inactive) as Consulting Physician (Orthopedic Surgery) [name], MD as Consulting Physician (Orthopedic Surgery) [name] [name], NP as Nurse Practitioner (Nurse Practitioner) [name], MD (Inactive) as Consulting Physician (Neurology)  Extended Emergency Contact Information Primary Emergency Contact: [name],[name] Address: [address]          [address]          [place], [place] [address] [place] Home Phone: [phone] Relation: Sister Secondary Emergency Contact: [name],[name]  [place] Home Phone: [phone] Mobile Phone: [phone] Relation: Sister  Code Status:   Goals of care: Advanced Directive information    01/08/2024    9:38 AM  Advanced Directives  Does Patient Have a Medical Advance Directive? Yes  Type of Estate agent of [place];Out of facility DNR (pink MOST or yellow form)  Does patient want to make changes to medical advance directive? No - Patient declined  Copy of Healthcare Power of Attorney in Chart? Yes - validated most recent copy scanned in chart (See row information)  Pre-existing out of facility DNR order (yellow form or pink MOST form) Pink MOST form placed in chart (order not valid for inpatient use)     Chief Complaint  Patient presents with   Medical Management of Chronic Issues    Routine Visit     HPI:  Pt is a 89 y.o. female seen today for medical management of chronic diseases.    02/12/22  Obstructive nephritis, underwent cystoscopy with right retrograde pyelogram, right ureteral stent placed, f/u Urology Dr. Alvaro, 03/06/22 cystoscopy with retrograde pyelogram, ureteroscopy and stent replacement, SPC exchange.                05/03/22 abd US  no renal or gallbladder stone or hydronephrosis. 05/02/22 Xray abd may represent ileus. Hx of Ureteral stone with hydronephrosis per CT abd                          05/13/22 Urology: R ureteral stone, sp R ureteroscopy stone free 03/2022, stent. SPC  urethral stricture, off Oxybutynin . Hx of Ureteral stone with hydronephrosis per CT abd             External hemorrhoids, placed on Colace             Hypokalemia, K 4.4 10/09/23, taking  Kcl             Anemia, Hgb 12.8 10/09/23             SVT, takes Coreg              BLE edema, mild,  off Furosemide .              Adult failure to thrive, under Hospice service, weight stable.              Suprapubic Catheter, f/u Urology, off Uribel and Oxybutynin , Hx of urethral stricture.              Depression, the patient stated she sleeps all the time, ARUBA  of  Mirtazapine , off Lorazepam               Dementia, under hospice service, prn Morphine  available to her.              Restless leg syndrome,  taking MiraPex               Hx of seizures, no active seizures, off meds.              COPD, on ProAir , Albuterol  neb, Mucinex, chronic cough             HTN, intermittent elevated Bp, asymptomatic, taking Carvedilol .      Past Medical History:  Diagnosis Date   Acute bronchitis 05/23/2011   Acute upper respiratory infections of unspecified site 05/23/2011   Arthritis    Chronic airway obstruction, not elsewhere classified 05/23/2011   Coronary artery disease    Disturbance of salivary secretion 01/31/2011   Dizziness and giddiness 01/31/2011   Dyspnea    Essential tremor 04/25/2014   External hemorrhoids without mention of complication 01/31/2011   Gait disorder 04/25/2014   GERD (gastroesophageal reflux  disease)    History of kidney stones    Insomnia, unspecified 09/12/2011   Lumbago 01/31/2011   Major depressive disorder, single episode, unspecified 01/31/2011   Memory disorder 04/25/2014   Mitral valve disorders(424.0) 01/31/2011   Other and unspecified hyperlipidemia 01/31/2011   Other convulsions 01/31/2011   Other emphysema (HCC) 01/31/2011   Pain in joint, site unspecified 01/31/2011   Restless legs syndrome (RLS) 09/12/2011   Retinal detachment with retinal defect of right eye 2011   right eye twice   Seizure disorder (HCC)    Senile osteoporosis 01/31/2011   Spontaneous ecchymoses 01/31/2011   Squamous cell carcinoma of leg    Stiffness of joints, not elsewhere classified, multiple sites 01/31/2011   Unspecified essential hypertension 01/31/2011   Past Surgical History:  Procedure Laterality Date   ABDOMINAL HYSTERECTOMY  06/21/2003   TAH/BSO, omenectomy PSB resect, Stg IC cystadenofibroma   CHOLECYSTECTOMY  2005   Dr. [name]   CYSTOSCOPY W/ URETERAL STENT PLACEMENT Right 02/13/2022   Procedure: CYSTOSCOPY WITH RETROGRADE PYELOGRAM/URETERAL STENT PLACEMENT, AND SUPRAPUBIC TUBE EXCHANGE;  Surgeon: [name], MD;  Location: [facility];  Service: Urology;  Laterality: Right;   CYSTOSCOPY WITH RETROGRADE PYELOGRAM, URETEROSCOPY AND STENT PLACEMENT Right 03/06/2022   Procedure: CYSTOSCOPY WITH RETROGRADE PYELOGRAM, URETEROSCOPY AND STENT REPLACEMENT, SUPRAPUBIC CATHETER EXCHANGE;  Surgeon: [name], MD;  Location: [facility];  Service: Urology;  Laterality: Right;   ELBOW SURGERY Right 2008   broken   Dr. [name]   EYE SURGERY     HOLMIUM LASER APPLICATION Right 03/06/2022   Procedure: HOLMIUM LASER APPLICATION;  Surgeon: [name], MD;  Location: [facility];  Service: Urology;  Laterality: Right;   INTRAMEDULLARY (IM) NAIL INTERTROCHANTERIC Left 08/30/2021   Procedure: INTRAMEDULLARY (IM) NAIL INTERTROCHANTRIC;  Surgeon: [name], MD;  Location: [facility];  Service:  Orthopedics;  Laterality: Left;   ORIF PATELLA Left 05/02/2020   Procedure: OPEN REDUCTION INTERNAL (ORIF) FIXATION LEFT PATELLA WITH MEDIAL AND LATERAL LIGAMENT REINFORCEMENTS;  Surgeon: [name], MD;  Location: [facility];  Service: Orthopedics;  Laterality: Left;   ORIF PATELLA Left 05/30/2020   Procedure: OPEN REDUCTION INTERNAL (ORIF) FIXATION PATELLA;  Surgeon: [name], MD;  Location: [facility];  Service: Orthopedics;  Laterality: Left;   RETINAL DETACHMENT SURGERY N/A    two   REVERSE SHOULDER ARTHROPLASTY Left 05/06/2019   Procedure:  REVERSE SHOULDER ARTHROPLASTY;  Surgeon: Melita Drivers, MD;  Location: [facility];  Service: Orthopedics;  Laterality: Left;    ROTATOR CUFF REPAIR Right 2012   Dr. Gerome   SQUAMOUS CELL CARCINOMA EXCISION Bilateral 2012, 8/14   [place] on legs   Dr. Jadine   TONSILLECTOMY  1941   VIDEO BRONCHOSCOPY WITH ENDOBRONCHIAL NAVIGATION N/A 11/29/2015   Procedure: VIDEO BRONCHOSCOPY WITH ENDOBRONCHIAL NAVIGATION;  Surgeon: Lamar GORMAN Chris, MD;  Location: [facility];  Service: Thoracic;  Laterality: N/A;    Allergies  Allergen Reactions   Vioxx [Rofecoxib] Shortness Of Breath   Dyazide [Hydrochlorothiazide-Triamterene] Other (See Comments)    Lowers blood pressure too much   Latex Swelling and Other (See Comments)    Allergic, per [facility]   Sulfa Antibiotics Nausea And Vomiting and Other (See Comments)    Allergic, per [facility]   Sumycin [Tetracycline] Other (See Comments)    Can't take due to drug reaction- Allergic, per [facility]    Allergies as of 01/08/2024       Reactions   Vioxx [rofecoxib] Shortness Of Breath   Dyazide [hydrochlorothiazide-triamterene] Other (See Comments)   Lowers blood pressure too much   Latex Swelling, Other (See Comments)   Allergic, per [facility]   Sulfa Antibiotics Nausea And Vomiting, Other (See Comments)   Allergic, per [facility]   Sumycin [tetracycline] Other (See Comments)   Can't take due to drug reaction- Allergic, per  [facility]        Medication List        Accurate as of January 08, 2024 11:59 PM. If you have any questions, ask your nurse or doctor.          STOP taking these medications    dextromethorphan-guaiFENesin 30-600 MG 12hr tablet Commonly known as: MUCINEX DM   ipratropium-albuterol  0.5-2.5 (3) MG/3ML Soln Commonly known as: DUONEB   levofloxacin  500 MG tablet Commonly known as: LEVAQUIN    oxybutynin  5 MG tablet Commonly known as: DITROPAN        TAKE these medications    acetaminophen  325 MG tablet Commonly known as: TYLENOL  Take 650 mg by mouth every 6 (six) hours as needed for fever or mild pain. Give 2 tablet by mouth every 6 hours as needed for Pain   acetic acid 0.25 % irrigation Irrigate with 1 Application as directed 2 (two) times daily.   carvedilol  3.125 MG tablet Commonly known as: COREG  Take 1 tablet (3.125 mg total) by mouth 2 (two) times daily with a meal.   docusate sodium  100 MG capsule Commonly known as: COLACE Take 100 mg by mouth as needed for mild constipation.   ketoconazole 2 % shampoo Commonly known as: NIZORAL Apply 1 Application topically 2 (two) times a week. Every day shift on Tuesdays and Fridays for scalp irritation. What changed: Another medication with the same name was removed. Continue taking this medication, and follow the directions you see here.   lidocaine  4 % cream Commonly known as: LMX Apply 1 Application topically daily. left lower leg topically one time a day for pain apply on left lower leg for pain   mirtazapine  15 MG tablet Commonly known as: REMERON  Take 15 mg by mouth daily. What changed: Another medication with the same name was removed. Continue taking this medication, and follow the directions you see here.   Morphine  Sulfate (Concentrate) 5 MG/0.25ML Soln Take 0.25 mLs by mouth every 8 (eight) hours as needed.   nystatin cream Commonly known as: MYCOSTATIN Apply 1 Application topically as  needed. What  changed: Another medication with the same name was removed. Continue taking this medication, and follow the directions you see here.   Orajel 2X Toothache & Gum 20-0.26 % Gel Generic drug: Benzocaine-Menthol  Use as directed 1 Dose in the mouth or throat every 6 (six) hours as needed (Tooth Pain).   potassium chloride  10 MEQ CR capsule Commonly known as: MICRO-K  Take 10 mEq by mouth daily.   pramipexole  0.25 MG tablet Commonly known as: MIRAPEX  Take 0.25 mg by mouth daily. As needed for restless legs, see other bedtime listing What changed: Another medication with the same name was removed. Continue taking this medication, and follow the directions you see here.   ProAir  RespiClick 108 (90 Base) MCG/ACT Aepb Generic drug: Albuterol  Sulfate Inhale 2 puffs into the lungs daily as needed (shortness of breath or wheezing). What changed: Another medication with the same name was removed. Continue taking this medication, and follow the directions you see here.   albuterol  1.25 MG/3ML nebulizer solution Commonly known as: ACCUNEB  Take 1 ampule by nebulization every 8 (eight) hours as needed for shortness of breath. What changed: Another medication with the same name was removed. Continue taking this medication, and follow the directions you see here.        Review of Systems  Constitutional:  Positive for fatigue. Negative for appetite change and fever.       Sleepy  HENT:  Positive for hearing loss. Negative for congestion and trouble swallowing.   Eyes:  Negative for visual disturbance.  Respiratory:  Positive for cough. Negative for shortness of breath.        DOE is chronic  Cardiovascular:  Positive for leg swelling.  Gastrointestinal:  Negative for abdominal pain and constipation.       Hemorrhoids from previous examination.   Genitourinary:  Positive for difficulty urinating.       [facility]  Musculoskeletal:  Positive for arthralgias and gait problem.       S/p left hip ORIF, ORIF  of the left patella. Positional pain in the left knee sometimes.   Skin:  Negative for color change.       BLE discoloration, chronic venous insufficiency skin changes   Neurological:  Negative for seizures, weakness and headaches.       Memory lapses. Hx of seizures. RLS  Psychiatric/Behavioral:  Negative for behavioral problems and sleep disturbance. The patient is not nervous/anxious.     Immunization History  Administered Date(s) Administered    sv, Bivalent, Protein Subunit Rsvpref,pf (Abrysvo) 04/26/2022   Fluzone Influenza virus vaccine,trivalent (IIV3), split virus 01/15/2017, 01/08/2018, 12/09/2018   INFLUENZA, HIGH DOSE SEASONAL PF 12/25/2015, 01/20/2017, 01/08/2023   Influenza Split 01/06/2014   Influenza Whole 01/07/2012, 01/06/2013   Influenza,inj,Quad PF,6+ Mos 12/21/2014   Influenza-Unspecified 12/09/2018, 01/18/2020, 01/24/2021, 01/30/2022   Moderna SARS-COV2 Booster Vaccination 04/02/2021   Moderna Sars-Covid-2 Vaccination 04/12/2019, 06/05/2019, 02/15/2020, 09/05/2020   PFIZER(Purple Top)SARS-COV-2 Vaccination 12/26/2020   PPD Test 02/16/2022   Pneumococcal Conjugate-13 02/01/2014   Pneumococcal Polysaccharide-23 12/20/1992, 01/15/2000, 04/08/2004, 06/08/2004   Td 04/08/2002, 04/22/2002   Tdap 04/09/2011, 08/17/2015   Unspecified SARS-COV-2 Vaccination 01/22/2023   Zoster Recombinant(Shingrix) 05/29/2005, 08/27/2017, 06/13/2022   Zoster, Live 04/08/2008, 05/29/2014, 05/28/2017   Zoster, Unspecified 05/29/2005   Pertinent  Health Maintenance Due  Topic Date Due   Influenza Vaccine  11/07/2023   DEXA SCAN  Completed   Mammogram  Discontinued      02/15/2022    3:00 PM 02/15/2022    9:00  PM 02/16/2022    8:00 AM 03/06/2022    7:25 AM 05/20/2022    3:42 PM  Fall Risk  Falls in the past year?     0  Was there an injury with Fall?     0  Fall Risk Category Calculator     0  (RETIRED) Patient Fall Risk Level Low fall risk  Moderate fall risk  Moderate fall  risk  High fall risk    Patient at Risk for Falls Due to     No Fall Risks  Fall risk Follow up     Falls evaluation completed     Data saved with a previous flowsheet row definition   Functional Status Survey:    Vitals:   01/08/24 0926 01/08/24 1237  BP: (!) 150/98 117/84  Pulse: 86   Resp: 16   Temp: (!) 97.2 F (36.2 C)   SpO2: 91%   Weight: 166 lb 3.2 oz (75.4 kg)    Body mass index is 30.4 kg/m. Physical Exam Vitals and nursing note reviewed.  Constitutional:      Appearance: Normal appearance.  HENT:     Head: Normocephalic and atraumatic.     Nose: Nose normal.     Mouth/Throat:     Mouth: Mucous membranes are moist.     Pharynx: No posterior oropharyngeal erythema.  Eyes:     Extraocular Movements: Extraocular movements intact.     Conjunctiva/sclera: Conjunctivae normal.     Right eye: Right conjunctiva is not injected.     Left eye: Left conjunctiva is not injected.     Pupils: Pupils are equal, round, and reactive to light.  Neck:     Comments: Left adam's apple bony aspect, sometimes feels soreness on palpation is chronic, no noted lymph nodes  Cardiovascular:     Rate and Rhythm: Normal rate and regular rhythm.     Heart sounds: Murmur heard.     Comments: PD pulses are not felt from previous examination.  Pulmonary:     Effort: Pulmonary effort is normal.     Breath sounds: Rales present.     Comments: Decreased air entry to both lungs.  Bibasilar rales.  Abdominal:     General: Bowel sounds are normal.     Palpations: Abdomen is soft.     Tenderness: There is no abdominal tenderness.     Comments: Mid abd surgical scar. SPC  Genitourinary:    Comments: SPC. External hemorrhoids x2, no injury, bleeding, or pain  Musculoskeletal:     Cervical back: Normal range of motion and neck supple.     Right lower leg: Edema present.     Left lower leg: Edema present.     Comments: Decreased overhead ROM of the left shoulder. Left knee s/p ORIF of the  patella fx.  Trace edema BLE is chronic  Skin:    General: Skin is warm and dry.     Findings: Lesion present.     Comments: BLE discoloration, chronic venous insufficiency skin changes. Left shin skin lesions-opted out workup or tx.      Neurological:     General: No focal deficit present.     Mental Status: She is alert. Mental status is at baseline.     Gait: Gait abnormal.  Psychiatric:        Mood and Affect: Mood normal.        Behavior: Behavior normal.     Comments: Easily to be aroused while my examination,  smiled, answered questions appropriately, stated she sleeps all the time     Labs reviewed: Recent Labs    04/01/23 0000 10/09/23 1811  NA 144 142  K 3.9 4.4  CL 107 105  CO2 30* 30*  BUN 37* 13  CREATININE 0.9 0.8  CALCIUM 9.5 9.0   Recent Labs    04/01/23 0000 10/09/23 1811  AST 21 14  ALT 16 9  ALKPHOS 124  --   ALBUMIN  3.8 3.5   Recent Labs    04/01/23 0000 10/09/23 1811  WBC 5.4 5.4  NEUTROABS 3,175.00 2,857.00  HGB 12.5 12.8  HCT 40 43  PLT 167 308   Lab Results  Component Value Date   TSH 1.198 02/15/2022   No results found for: HGBA1C Lab Results  Component Value Date   CHOL 147 09/27/2021   HDL 48 09/27/2021   LDLCALC 78 09/27/2021   TRIG 131 09/27/2021   CHOLHDL 3.6 06/27/2015    Significant Diagnostic Results in last 30 days:  No results found.  Assessment/Plan Depression, psychotic (HCC)  the patient stated she sleeps all the time, GDR of  Mirtazapine  7.5mg /15mg  every day,  off Lorazepam    Major neurocognitive disorder (HCC) under hospice service, prn Morphine  available to her.   Restless legs syndrome (RLS)  taking MiraPex    Seizure (HCC)  no active seizures, off meds.   COPD (chronic obstructive pulmonary disease) (HCC) on ProAir , Albuterol  neb, Mucinex, chronic cough  Essential hypertension  intermittent elevated Bp, asymptomatic, taking Carvedilol .        Family/ staff Communication: plan of care  reviewed with the patient and charge nurse.   Labs/tests ordered:  none

## 2024-01-08 NOTE — Assessment & Plan Note (Signed)
 no active seizures, off meds.

## 2024-01-08 NOTE — Assessment & Plan Note (Signed)
 intermittent elevated Bp, asymptomatic, taking Carvedilol .

## 2024-01-08 NOTE — Assessment & Plan Note (Signed)
 taking MiraPex 

## 2024-01-12 ENCOUNTER — Encounter: Payer: Self-pay | Admitting: Nurse Practitioner

## 2024-01-14 ENCOUNTER — Non-Acute Institutional Stay: Payer: Self-pay | Admitting: Sports Medicine

## 2024-01-14 ENCOUNTER — Encounter: Payer: Self-pay | Admitting: Sports Medicine

## 2024-01-14 NOTE — Progress Notes (Unsigned)
 Location:  [name]   Nursing Home Room Number: 029-A Place of Service:  SNF (31) Provider:  Sherlynn Albert, MD    Mast, Man X, NP  Patient Care Team: Mast, Man X, NP as PCP - General (Internal Medicine) [name], MD as Consulting Physician (Ophthalmology) [name], MD as Consulting Physician (Cardiology) [name], MD as Consulting Physician (Dermatology) [name], MD as Consulting Physician (Orthopedic Surgery) [name], MD (Inactive) as Consulting Physician (Orthopedic Surgery) [name], MD as Consulting Physician (Orthopedic Surgery) [name] Mast, Man X, NP as Nurse Practitioner (Nurse Practitioner) [name], MD (Inactive) as Consulting Physician (Neurology)  Extended Emergency Contact Information Primary Emergency Contact: [name],[name] Address: [address]          [address]          [place], [place] [address] [place] Home Phone: [phone] Relation: Sister Secondary Emergency Contact: [name],[name]  [place] Home Phone: [phone] Mobile Phone: [phone] Relation: Sister  Code Status:  DNR Goals of care: Advanced Directive information    01/14/2024    2:49 PM  Advanced Directives  Does Patient Have a Medical Advance Directive? Yes  Type of Advance Directive Out of facility DNR (pink MOST or yellow form)  Does patient want to make changes to medical advance directive? No - Patient declined  Pre-existing out of facility DNR order (yellow form or pink MOST form) Pink MOST form placed in chart (order not valid for inpatient use);Yellow form placed in chart (order not valid for inpatient use)     Chief Complaint  Patient presents with   Acute Visit    Boil    HPI:  Pt is a 89 y.o. female seen today for an acute visit for    Past Medical History:  Diagnosis Date   Acute bronchitis 05/23/2011   Acute upper respiratory infections of unspecified  site 05/23/2011   Arthritis    Chronic airway obstruction, not elsewhere classified 05/23/2011   Coronary artery disease    Disturbance of salivary secretion 01/31/2011   Dizziness and giddiness 01/31/2011   Dyspnea    Essential tremor 04/25/2014   External hemorrhoids without mention of complication 01/31/2011   Gait disorder 04/25/2014   GERD (gastroesophageal reflux disease)    History of kidney stones    Insomnia, unspecified 09/12/2011   Lumbago 01/31/2011   Major depressive disorder, single episode, unspecified 01/31/2011   Memory disorder 04/25/2014   Mitral valve disorders(424.0) 01/31/2011   Other and unspecified hyperlipidemia 01/31/2011   Other convulsions 01/31/2011   Other emphysema (HCC) 01/31/2011   Pain in joint, site unspecified 01/31/2011   Restless legs syndrome (RLS) 09/12/2011   Retinal detachment with retinal defect of right eye 2011   right eye twice   Seizure disorder (HCC)    Senile osteoporosis 01/31/2011   Spontaneous ecchymoses 01/31/2011   Squamous cell carcinoma of leg    Stiffness of joints, not elsewhere classified, multiple sites 01/31/2011   Unspecified essential hypertension 01/31/2011   Past Surgical History:  Procedure Laterality Date   ABDOMINAL HYSTERECTOMY  06/21/2003   TAH/BSO, omenectomy PSB resect, Stg IC cystadenofibroma   CHOLECYSTECTOMY  2005   Dr. [name]   CYSTOSCOPY W/ URETERAL STENT PLACEMENT Right 02/13/2022   Procedure: CYSTOSCOPY WITH RETROGRADE PYELOGRAM/URETERAL STENT PLACEMENT, AND SUPRAPUBIC TUBE EXCHANGE;  Surgeon: [name], MD;  Location: [facility];  Service: Urology;  Laterality: Right;   CYSTOSCOPY WITH RETROGRADE PYELOGRAM, URETEROSCOPY AND STENT PLACEMENT  Right 03/06/2022   Procedure: CYSTOSCOPY WITH RETROGRADE PYELOGRAM, URETEROSCOPY AND STENT REPLACEMENT, SUPRAPUBIC CATHETER EXCHANGE;  Surgeon: [name], MD;  Location: [facility];  Service: Urology;  Laterality: Right;   ELBOW SURGERY Right 2008   broken    Dr. [name]   EYE SURGERY     HOLMIUM LASER APPLICATION Right 03/06/2022   Procedure: HOLMIUM LASER APPLICATION;  Surgeon: [name], MD;  Location: [facility];  Service: Urology;  Laterality: Right;   INTRAMEDULLARY (IM) NAIL INTERTROCHANTERIC Left 08/30/2021   Procedure: INTRAMEDULLARY (IM) NAIL INTERTROCHANTRIC;  Surgeon: [name], MD;  Location: [facility];  Service: Orthopedics;  Laterality: Left;   ORIF PATELLA Left 05/02/2020   Procedure: OPEN REDUCTION INTERNAL (ORIF) FIXATION LEFT PATELLA WITH MEDIAL AND LATERAL LIGAMENT REINFORCEMENTS;  Surgeon: [name], MD;  Location: [facility];  Service: Orthopedics;  Laterality: Left;   ORIF PATELLA Left 05/30/2020   Procedure: OPEN REDUCTION INTERNAL (ORIF) FIXATION PATELLA;  Surgeon: [name], MD;  Location: [facility];  Service: Orthopedics;  Laterality: Left;   RETINAL DETACHMENT SURGERY N/A    two   REVERSE SHOULDER ARTHROPLASTY Left 05/06/2019   Procedure: REVERSE SHOULDER ARTHROPLASTY;  Surgeon: Melita Drivers, MD;  Location: [facility];  Service: Orthopedics;  Laterality: Left;    ROTATOR CUFF REPAIR Right 2012   Dr. Gerome   SQUAMOUS CELL CARCINOMA EXCISION Bilateral 2012, 8/14   [place]s on legs   Dr. Jadine   TONSILLECTOMY  1941   VIDEO BRONCHOSCOPY WITH ENDOBRONCHIAL NAVIGATION N/A 11/29/2015   Procedure: VIDEO BRONCHOSCOPY WITH ENDOBRONCHIAL NAVIGATION;  Surgeon: Lamar GORMAN Chris, MD;  Location: [facility];  Service: Thoracic;  Laterality: N/A;    Allergies  Allergen Reactions   Vioxx [Rofecoxib] Shortness Of Breath   Dyazide [Hydrochlorothiazide-Triamterene] Other (See Comments)    Lowers blood pressure too much   Latex Swelling and Other (See Comments)    Allergic, per [facility]   Sulfa Antibiotics Nausea And Vomiting and Other (See Comments)    Allergic, per [facility]   Sumycin [Tetracycline] Other (See Comments)    Can't take due to drug reaction- Allergic, per [facility]    Allergies as of 01/14/2024       Reactions    Vioxx [rofecoxib] Shortness Of Breath   Dyazide [hydrochlorothiazide-triamterene] Other (See Comments)   Lowers blood pressure too much   Latex Swelling, Other (See Comments)   Allergic, per [facility]   Sulfa Antibiotics Nausea And Vomiting, Other (See Comments)   Allergic, per [facility]   Sumycin [tetracycline] Other (See Comments)   Can't take due to drug reaction- Allergic, per [facility]        Medication List        Accurate as of January 14, 2024  2:50 PM. If you have any questions, ask your nurse or doctor.          acetaminophen  325 MG tablet Commonly known as: TYLENOL  Take 650 mg by mouth every 6 (six) hours as needed for fever or mild pain. Give 2 tablet by mouth every 6 hours as needed for Pain   acetic acid 0.25 % irrigation Irrigate with 1 Application as directed 2 (two) times daily.   carvedilol  3.125 MG tablet Commonly known as: COREG  Take 1 tablet (3.125 mg total) by mouth 2 (two) times daily with a meal.   docusate sodium  100 MG capsule Commonly known as: COLACE Take 100 mg by mouth as needed for mild constipation.   ketoconazole 2 % shampoo Commonly known as: NIZORAL Apply 1  Application topically 2 (two) times a week. Every day shift on Tuesdays and Fridays for scalp irritation.   lidocaine  4 % cream Commonly known as: LMX Apply 1 Application topically daily. left lower leg topically one time a day for pain apply on left lower leg for pain   mirtazapine  15 MG tablet Commonly known as: REMERON  Take 15 mg by mouth daily.   mirtazapine  7.5 MG tablet Commonly known as: REMERON  Take 7.5 mg by mouth at bedtime.   Morphine  Sulfate (Concentrate) 5 MG/0.25ML Soln Take 0.25 mLs by mouth every 8 (eight) hours as needed.   nystatin cream Commonly known as: MYCOSTATIN Apply 1 Application topically as needed.   Orajel 2X Toothache & Gum 20-0.26 % Gel Generic drug: Benzocaine-Menthol  Use as directed 1 Dose in the mouth or throat every 6 (six) hours as needed  (Tooth Pain).   potassium chloride  10 MEQ CR capsule Commonly known as: MICRO-K  Take 10 mEq by mouth daily.   pramipexole  0.25 MG tablet Commonly known as: MIRAPEX  Take 0.25 mg by mouth daily. As needed for restless legs, see other bedtime listing   ProAir  RespiClick 108 (90 Base) MCG/ACT Aepb Generic drug: Albuterol  Sulfate Inhale 2 puffs into the lungs daily as needed (shortness of breath or wheezing).   albuterol  1.25 MG/3ML nebulizer solution Commonly known as: ACCUNEB  Take 1 ampule by nebulization every 8 (eight) hours as needed for shortness of breath.        Review of Systems  Immunization History  Administered Date(s) Administered    sv, Bivalent, Protein Subunit Rsvpref,pf (Abrysvo) 04/26/2022   Fluzone Influenza virus vaccine,trivalent (IIV3), split virus 01/15/2017, 01/08/2018, 12/09/2018   INFLUENZA, HIGH DOSE SEASONAL PF 12/25/2015, 01/20/2017, 01/08/2023   Influenza Split 01/06/2014   Influenza Whole 01/07/2012, 01/06/2013   Influenza,inj,Quad PF,6+ Mos 12/21/2014   Influenza-Unspecified 12/09/2018, 01/18/2020, 01/24/2021, 01/30/2022   Moderna SARS-COV2 Booster Vaccination 04/02/2021   Moderna Sars-Covid-2 Vaccination 04/12/2019, 06/05/2019, 02/15/2020, 09/05/2020   PFIZER(Purple Top)SARS-COV-2 Vaccination 12/26/2020   PPD Test 02/16/2022   Pneumococcal Conjugate-13 02/01/2014   Pneumococcal Polysaccharide-23 12/20/1992, 01/15/2000, 04/08/2004, 06/08/2004   Td 04/08/2002, 04/22/2002   Tdap 04/09/2011, 08/17/2015   Unspecified SARS-COV-2 Vaccination 01/22/2023   Zoster Recombinant(Shingrix) 05/29/2005, 08/27/2017, 06/13/2022   Zoster, Live 04/08/2008, 05/29/2014, 05/28/2017   Zoster, Unspecified 05/29/2005   Pertinent  Health Maintenance Due  Topic Date Due   Influenza Vaccine  11/07/2023   DEXA SCAN  Completed   Mammogram  Discontinued      02/15/2022    3:00 PM 02/15/2022    9:00 PM 02/16/2022    8:00 AM 03/06/2022    7:25 AM 05/20/2022    3:42  PM  Fall Risk  Falls in the past year?     0  Was there an injury with Fall?     0  Fall Risk Category Calculator     0  (RETIRED) Patient Fall Risk Level Low fall risk  Moderate fall risk  Moderate fall risk  High fall risk    Patient at Risk for Falls Due to     No Fall Risks  Fall risk Follow up     Falls evaluation completed     Data saved with a previous flowsheet row definition   Functional Status Survey:    Vitals:   01/14/24 1447  BP: (!) 166/92  Pulse: 84  Resp: 20  Temp: (!) 97.2 F (36.2 C)  SpO2: 95%  Weight: 166 lb 3.2 oz (75.4 kg)  Height: 5' 2 (1.575 m)  Body mass index is 30.4 kg/m. Physical Exam  Labs reviewed: Recent Labs    04/01/23 0000 10/09/23 1811  NA 144 142  K 3.9 4.4  CL 107 105  CO2 30* 30*  BUN 37* 13  CREATININE 0.9 0.8  CALCIUM 9.5 9.0   Recent Labs    04/01/23 0000 10/09/23 1811  AST 21 14  ALT 16 9  ALKPHOS 124  --   ALBUMIN  3.8 3.5   Recent Labs    04/01/23 0000 10/09/23 1811  WBC 5.4 5.4  NEUTROABS 3,175.00 2,857.00  HGB 12.5 12.8  HCT 40 43  PLT 167 308   Lab Results  Component Value Date   TSH 1.198 02/15/2022   No results found for: HGBA1C Lab Results  Component Value Date   CHOL 147 09/27/2021   HDL 48 09/27/2021   LDLCALC 78 09/27/2021   TRIG 131 09/27/2021   CHOLHDL 3.6 06/27/2015    Significant Diagnostic Results in last 30 days:  No results found.  Assessment/Plan There are no diagnoses linked to this encounter.   Family/ staff Communication:   Labs/tests ordered:

## 2024-02-04 DIAGNOSIS — F411 Generalized anxiety disorder: Secondary | ICD-10-CM | POA: Diagnosis not present

## 2024-02-04 DIAGNOSIS — F331 Major depressive disorder, recurrent, moderate: Secondary | ICD-10-CM | POA: Diagnosis not present

## 2024-02-17 ENCOUNTER — Encounter: Payer: Self-pay | Admitting: Nurse Practitioner

## 2024-02-17 ENCOUNTER — Non-Acute Institutional Stay (SKILLED_NURSING_FACILITY): Payer: Self-pay | Admitting: Nurse Practitioner

## 2024-02-17 DIAGNOSIS — Z66 Do not resuscitate: Secondary | ICD-10-CM

## 2024-02-17 DIAGNOSIS — E876 Hypokalemia: Secondary | ICD-10-CM | POA: Diagnosis not present

## 2024-02-17 DIAGNOSIS — Z87898 Personal history of other specified conditions: Secondary | ICD-10-CM | POA: Diagnosis not present

## 2024-02-17 DIAGNOSIS — J439 Emphysema, unspecified: Secondary | ICD-10-CM

## 2024-02-17 DIAGNOSIS — F323 Major depressive disorder, single episode, severe with psychotic features: Secondary | ICD-10-CM

## 2024-02-17 DIAGNOSIS — I471 Supraventricular tachycardia, unspecified: Secondary | ICD-10-CM

## 2024-02-17 DIAGNOSIS — F0390 Unspecified dementia without behavioral disturbance: Secondary | ICD-10-CM

## 2024-02-17 DIAGNOSIS — G2581 Restless legs syndrome: Secondary | ICD-10-CM

## 2024-02-17 DIAGNOSIS — I10 Essential (primary) hypertension: Secondary | ICD-10-CM

## 2024-02-17 NOTE — Assessment & Plan Note (Signed)
 Supplemented with Potassium, K4.473 2025

## 2024-02-17 NOTE — Assessment & Plan Note (Signed)
 No behavioral issues, and her hospice service

## 2024-02-17 NOTE — Assessment & Plan Note (Signed)
 Due to urethral stricture, suprapubic catheter present

## 2024-02-17 NOTE — Progress Notes (Unsigned)
 Location:  [name] Room Number: 029-A Place of Service:  SNF (31) Provider:  Alizon Schmeling X, NP  Patient Care Team: Adelise Buswell X, NP as PCP - General (Internal Medicine) [name], MD as Consulting Physician (Ophthalmology) [name], MD as Consulting Physician (Cardiology) [name], MD as Consulting Physician (Dermatology) [name], MD as Consulting Physician (Orthopedic Surgery) [name], MD (Inactive) as Consulting Physician (Orthopedic Surgery) [name], MD as Consulting Physician (Orthopedic Surgery) [name] Carmela Piechowski X, NP as Nurse Practitioner (Nurse Practitioner) [name], MD (Inactive) as Consulting Physician (Neurology)  Extended Emergency Contact Information Primary Emergency Contact: [name],[name] Address: [address]          [address]          [place], [place] [address] [place] Home Phone: [phone] Relation: Sister Secondary Emergency Contact: [name],[name]  [place] Home Phone: [phone] Mobile Phone: [phone] Relation: Sister  Code Status:  DNR Goals of care: Advanced Directive information    01/14/2024    2:49 PM  Advanced Directives  Does Patient Have a Medical Advance Directive? Yes  Type of Advance Directive Out of facility DNR (pink MOST or yellow form)  Does patient want to make changes to medical advance directive? No - Patient declined  Pre-existing out of facility DNR order (yellow form or pink MOST form) Pink MOST form placed in chart (order not valid for inpatient use);Yellow form placed in chart (order not valid for inpatient use)     Chief Complaint  Patient presents with  . Medical Management of Chronic Issues    Routine visit     HPI:  Pt is a 89 y.o. female seen today for medical management of chronic diseases.   02/12/22 Obstructive nephritis, underwent cystoscopy with right retrograde pyelogram, right ureteral  stent placed, f/u Urology Dr. Alvaro, 03/06/22 cystoscopy with retrograde pyelogram, ureteroscopy and stent replacement, SPC exchange.                05/03/22 abd US  no renal or gallbladder stone or hydronephrosis. 05/02/22 Xray abd may represent ileus. Hx of Ureteral stone with hydronephrosis per CT abd                          05/13/22 Urology: R ureteral stone, sp R ureteroscopy stone free 03/2022, stent. SPC  urethral stricture, off Oxybutynin . Hx of Ureteral stone with hydronephrosis per CT abd             External hemorrhoids, placed on Colace             Hypokalemia, K 4.4 10/09/23, taking  Kcl             Anemia, Hgb 12.8 10/09/23             SVT, takes Coreg              BLE edema, mild,  off Furosemide .              Adult failure to thrive, under Hospice service, weight stable.              Suprapubic Catheter, f/u Urology, off Uribel and Oxybutynin , Hx of urethral stricture.              Depression, the patient stated she sleeps all the time, GDR of  Mirtazapine , off Lorazepam   Dementia, under hospice service, prn Morphine  available to her.              Restless leg syndrome,  taking MiraPex               Hx of seizures, no active seizures, off meds.              COPD, on ProAir , Albuterol  neb, Mucinex, chronic cough             HTN, intermittent elevated Bp, asymptomatic, taking Carvedilol .       Past Medical History:  Diagnosis Date  . Acute bronchitis 05/23/2011  . Acute upper respiratory infections of unspecified site 05/23/2011  . Arthritis   . Chronic airway obstruction, not elsewhere classified 05/23/2011  . Coronary artery disease   . Disturbance of salivary secretion 01/31/2011  . Dizziness and giddiness 01/31/2011  . Dyspnea   . Essential tremor 04/25/2014  . External hemorrhoids without mention of complication 01/31/2011  . Gait disorder 04/25/2014  . GERD (gastroesophageal reflux disease)   . History of kidney stones   . Insomnia, unspecified 09/12/2011  .  Lumbago 01/31/2011  . Major depressive disorder, single episode, unspecified 01/31/2011  . Memory disorder 04/25/2014  . Mitral valve disorders(424.0) 01/31/2011  . Other and unspecified hyperlipidemia 01/31/2011  . Other convulsions 01/31/2011  . Other emphysema (HCC) 01/31/2011  . Pain in joint, site unspecified 01/31/2011  . Restless legs syndrome (RLS) 09/12/2011  . Retinal detachment with retinal defect of right eye 2011   right eye twice  . Seizure disorder (HCC)   . Senile osteoporosis 01/31/2011  . Spontaneous ecchymoses 01/31/2011  . Squamous cell carcinoma of leg   . Stiffness of joints, not elsewhere classified, multiple sites 01/31/2011  . Unspecified essential hypertension 01/31/2011   Past Surgical History:  Procedure Laterality Date  . ABDOMINAL HYSTERECTOMY  06/21/2003   TAH/BSO, omenectomy PSB resect, Stg IC cystadenofibroma  . CHOLECYSTECTOMY  2005   Dr. [name]  . CYSTOSCOPY W/ URETERAL STENT PLACEMENT Right 02/13/2022   Procedure: CYSTOSCOPY WITH RETROGRADE PYELOGRAM/URETERAL STENT PLACEMENT, AND SUPRAPUBIC TUBE EXCHANGE;  Surgeon: [name], MD;  Location: [facility];  Service: Urology;  Laterality: Right;  . CYSTOSCOPY WITH RETROGRADE PYELOGRAM, URETEROSCOPY AND STENT PLACEMENT Right 03/06/2022   Procedure: CYSTOSCOPY WITH RETROGRADE PYELOGRAM, URETEROSCOPY AND STENT REPLACEMENT, SUPRAPUBIC CATHETER EXCHANGE;  Surgeon: [name], MD;  Location: [facility];  Service: Urology;  Laterality: Right;  . ELBOW SURGERY Right 2008   broken   Dr. [name]  . EYE SURGERY    . HOLMIUM LASER APPLICATION Right 03/06/2022   Procedure: HOLMIUM LASER APPLICATION;  Surgeon: [name], MD;  Location: [facility];  Service: Urology;  Laterality: Right;  . INTRAMEDULLARY (IM) NAIL INTERTROCHANTERIC Left 08/30/2021   Procedure: INTRAMEDULLARY (IM) NAIL INTERTROCHANTRIC;  Surgeon: [name], MD;  Location: [facility];  Service: Orthopedics;  Laterality: Left;  . ORIF PATELLA Left  05/02/2020   Procedure: OPEN REDUCTION INTERNAL (ORIF) FIXATION LEFT PATELLA WITH MEDIAL AND LATERAL LIGAMENT REINFORCEMENTS;  Surgeon: [name], MD;  Location: [facility];  Service: Orthopedics;  Laterality: Left;  . ORIF PATELLA Left 05/30/2020   Procedure: OPEN REDUCTION INTERNAL (ORIF) FIXATION PATELLA;  Surgeon: [name], MD;  Location: [facility];  Service: Orthopedics;  Laterality: Left;  . RETINAL DETACHMENT SURGERY N/A    two  . REVERSE SHOULDER ARTHROPLASTY Left 05/06/2019   Procedure: REVERSE SHOULDER ARTHROPLASTY;  Surgeon: Melita Drivers, MD;  Location: [facility];  Service: Orthopedics;  Laterality:  Left;   . ROTATOR CUFF REPAIR Right 2012   Dr. Gerome  . SQUAMOUS CELL CARCINOMA EXCISION Bilateral 2012, 8/14   [place] on legs   Dr. Jadine  . TONSILLECTOMY  1941  . VIDEO BRONCHOSCOPY WITH ENDOBRONCHIAL NAVIGATION N/A 11/29/2015   Procedure: VIDEO BRONCHOSCOPY WITH ENDOBRONCHIAL NAVIGATION;  Surgeon: Lamar GORMAN Chris, MD;  Location: [facility];  Service: Thoracic;  Laterality: N/A;    Allergies  Allergen Reactions  . Vioxx [Rofecoxib] Shortness Of Breath  . Dyazide [Hydrochlorothiazide-Triamterene] Other (See Comments)    Lowers blood pressure too much  . Latex Swelling and Other (See Comments)    Allergic, per [facility]  . Sulfa Antibiotics Nausea And Vomiting and Other (See Comments)    Allergic, per MAR  . Sumycin [Tetracycline] Other (See Comments)    Can't take due to drug reaction- Allergic, per [facility]    Outpatient Encounter Medications as of 02/17/2024  Medication Sig  . acetaminophen  (TYLENOL ) 325 MG tablet Take 650 mg by mouth every 6 (six) hours as needed for fever or mild pain. Give 2 tablet by mouth every 6 hours as needed for Pain  . acetic acid 0.25 % irrigation Irrigate with 1 Application as directed 2 (two) times daily.  . albuterol  (ACCUNEB ) 1.25 MG/3ML nebulizer solution Take 1 ampule by nebulization every 8 (eight) hours as needed for shortness of  breath.  . Albuterol  Sulfate (PROAIR  RESPICLICK) 108 (90 Base) MCG/ACT AEPB Inhale 2 puffs into the lungs daily as needed (shortness of breath or wheezing).  . Benzocaine-Menthol  (ORAJEL 2X TOOTHACHE & GUM) 20-0.26 % GEL Use as directed 1 Dose in the mouth or throat every 6 (six) hours as needed (Tooth Pain).  . carvedilol  (COREG ) 3.125 MG tablet Take 1 tablet (3.125 mg total) by mouth 2 (two) times daily with a meal.  . dextromethorphan-guaiFENesin (MUCINEX DM) 30-600 MG 12hr tablet Take 1 tablet by mouth every 6 (six) hours as needed for cough.  . docusate sodium  (COLACE) 100 MG capsule Take 100 mg by mouth as needed for mild constipation.  . lidocaine  (LMX) 4 % cream Apply 1 Application topically daily. left lower leg topically one time a day for pain apply on left lower leg for pain  . mirtazapine  (REMERON ) 7.5 MG tablet Take 7.5 mg by mouth at bedtime.  . Morphine  Sulfate, Concentrate, 5 MG/0.25ML SOLN Take 0.25 mLs by mouth every 8 (eight) hours as needed.  . nystatin powder Apply 1 Application topically as needed.  . potassium chloride  (MICRO-K ) 10 MEQ CR capsule Take 10 mEq by mouth daily.  . pramipexole  (MIRAPEX ) 0.25 MG tablet Take 0.25 mg by mouth daily. As needed for restless legs, see other bedtime listing  . [DISCONTINUED] ketoconazole (NIZORAL) 2 % shampoo Apply 1 Application topically 2 (two) times a week. Every day shift on Tuesdays and Fridays for scalp irritation. (Patient not taking: Reported on 02/17/2024)  . [DISCONTINUED] mirtazapine  (REMERON ) 15 MG tablet Take 15 mg by mouth daily. (Patient not taking: Reported on 02/17/2024)  . [DISCONTINUED] nystatin cream (MYCOSTATIN) Apply 1 Application topically as needed. (Patient not taking: Reported on 02/17/2024)   No facility-administered encounter medications on file as of 02/17/2024.    Review of Systems  Constitutional:  Negative for appetite change, fatigue and fever.       Sleepy  HENT:  Positive for hearing loss.  Negative for congestion and trouble swallowing.   Eyes:  Negative for visual disturbance.  Respiratory:  Positive for cough. Negative for shortness of breath.  DOE is chronic  Cardiovascular:  Positive for leg swelling.  Gastrointestinal:  Negative for abdominal pain and constipation.       Hemorrhoids from previous examination.   Genitourinary:  Positive for difficulty urinating.       [facility]  Musculoskeletal:  Positive for arthralgias and gait problem.       S/p left hip ORIF, ORIF of the left patella. Positional pain in the left knee sometimes.   Skin:  Negative for color change.       BLE discoloration, chronic venous insufficiency skin changes   Neurological:  Negative for seizures, weakness and headaches.       Memory lapses. Hx of seizures. RLS  Psychiatric/Behavioral:  Negative for behavioral problems and sleep disturbance. The patient is not nervous/anxious.     Immunization History  Administered Date(s) Administered  .  sv, Bivalent, Protein Subunit Rsvpref,pf (Abrysvo) 04/26/2022  . Fluzone Influenza virus vaccine,trivalent (IIV3), split virus 01/15/2017, 01/08/2018, 12/09/2018  . INFLUENZA, HIGH DOSE SEASONAL PF 12/25/2015, 01/20/2017, 01/08/2023, 01/13/2024  . Influenza Split 01/06/2014  . Influenza Whole 01/07/2012, 01/06/2013  . Influenza,inj,Quad PF,6+ Mos 12/21/2014  . Influenza-Unspecified 12/09/2018, 01/18/2020, 01/24/2021, 01/30/2022  . Moderna SARS-COV2 Booster Vaccination 04/02/2021  . Moderna Sars-Covid-2 Vaccination 04/12/2019, 06/05/2019, 02/15/2020, 09/05/2020  . PFIZER(Purple Top)SARS-COV-2 Vaccination 12/26/2020  . PPD Test 02/16/2022  . Pneumococcal Conjugate-13 02/01/2014  . Pneumococcal Polysaccharide-23 12/20/1992, 01/15/2000, 04/08/2004, 06/08/2004  . Td 04/08/2002, 04/22/2002  . Tdap 04/09/2011, 08/17/2015  . Unspecified SARS-COV-2 Vaccination 01/22/2023  . Zoster Recombinant(Shingrix) 05/29/2005, 08/27/2017, 06/13/2022  . Zoster, Live  04/08/2008, 05/29/2014, 05/28/2017  . Zoster, Unspecified 05/29/2005   Pertinent  Health Maintenance Due  Topic Date Due  . Influenza Vaccine  Completed  . DEXA SCAN  Completed  . Mammogram  Discontinued      02/15/2022    3:00 PM 02/15/2022    9:00 PM 02/16/2022    8:00 AM 03/06/2022    7:25 AM 05/20/2022    3:42 PM  Fall Risk  Falls in the past year?     0  Was there an injury with Fall?     0  Fall Risk Category Calculator     0  (RETIRED) Patient Fall Risk Level Low fall risk  Moderate fall risk  Moderate fall risk  High fall risk    Patient at Risk for Falls Due to     No Fall Risks  Fall risk Follow up     Falls evaluation completed     Data saved with a previous flowsheet row definition   Functional Status Survey:    Vitals:   02/17/24 1002 02/17/24 1011  BP: (S) (!) 154/111 124/80  Pulse: 97   Weight: 167 lb (75.8 kg)   Height: 5' 2 (1.575 m)    Body mass index is 30.54 kg/m. Physical Exam Vitals and nursing note reviewed.  Constitutional:      Appearance: Normal appearance.  HENT:     Head: Normocephalic and atraumatic.     Nose: Nose normal.     Mouth/Throat:     Mouth: Mucous membranes are moist.     Pharynx: No posterior oropharyngeal erythema.  Eyes:     Extraocular Movements: Extraocular movements intact.     Conjunctiva/sclera: Conjunctivae normal.     Right eye: Right conjunctiva is not injected.     Left eye: Left conjunctiva is not injected.     Pupils: Pupils are equal, round, and reactive to light.  Neck:  Comments: Left adam's apple bony aspect, sometimes feels soreness on palpation is chronic, no noted lymph nodes  Cardiovascular:     Rate and Rhythm: Normal rate and regular rhythm.     Heart sounds: Murmur heard.     Comments: PD pulses are not felt from previous examination.  Pulmonary:     Effort: Pulmonary effort is normal.     Breath sounds: Rales present.     Comments: Decreased air entry to both lungs.  Bibasilar rales.   Abdominal:     General: Bowel sounds are normal.     Palpations: Abdomen is soft.     Tenderness: There is no abdominal tenderness.     Comments: Mid abd surgical scar. SPC  Genitourinary:    Comments: SPC. External hemorrhoids x2, no injury, bleeding, or pain  Musculoskeletal:     Cervical back: Normal range of motion and neck supple.     Right lower leg: Edema present.     Left lower leg: Edema present.     Comments: Decreased overhead ROM of the left shoulder. Left knee s/p ORIF of the patella fx.  Trace edema BLE is chronic  Skin:    General: Skin is warm and dry.     Findings: Lesion present.     Comments: BLE discoloration, chronic venous insufficiency skin changes. Left shin skin lesions-opted out workup or tx.      Neurological:     General: No focal deficit present.     Mental Status: She is alert. Mental status is at baseline.     Gait: Gait abnormal.  Psychiatric:        Mood and Affect: Mood normal.        Behavior: Behavior normal.     Comments: Easily to be aroused while my examination, smiled, answered questions appropriately, stated she sleeps all the time     Labs reviewed: Recent Labs    04/01/23 0000 10/09/23 1811  NA 144 142  K 3.9 4.4  CL 107 105  CO2 30* 30*  BUN 37* 13  CREATININE 0.9 0.8  CALCIUM 9.5 9.0   Recent Labs    04/01/23 0000 10/09/23 1811  AST 21 14  ALT 16 9  ALKPHOS 124  --   ALBUMIN  3.8 3.5   Recent Labs    04/01/23 0000 10/09/23 1811  WBC 5.4 5.4  NEUTROABS 3,175.00 2,857.00  HGB 12.5 12.8  HCT 40 43  PLT 167 308   Lab Results  Component Value Date   TSH 1.198 02/15/2022   No results found for: HGBA1C Lab Results  Component Value Date   CHOL 147 09/27/2021   HDL 48 09/27/2021   LDLCALC 78 09/27/2021   TRIG 131 09/27/2021   CHOLHDL 3.6 06/27/2015    Significant Diagnostic Results in last 30 days:  No results found.  Assessment/Plan History of urinary retention Due to urethral stricture,  suprapubic catheter present  Hypokalemia Supplemented with Potassium, K4.473 2025  SVT (supraventricular tachycardia) Heart rate is controlled, continue Coreg   Depression, psychotic (HCC) Her mood is a stable GDR successfully, mirtazapine  7.5 mg daily, off lorazepam   Major neurocognitive disorder (HCC) No behavioral issues, and her hospice service  Restless legs syndrome (RLS) Stable, continue Mirapex   Essential hypertension Loose blood pressure controlled, continue carvedilol   COPD (chronic obstructive pulmonary disease) (HCC) Stable, chronic hacking cough, continue bronchodilators     Family/ staff Communication: Plan of care reviewed with the patient and charge nurse  Labs/tests ordered: None

## 2024-02-17 NOTE — Assessment & Plan Note (Signed)
 Stable, chronic hacking cough, continue bronchodilators

## 2024-02-17 NOTE — Assessment & Plan Note (Signed)
 Loose blood pressure controlled, continue carvedilol 

## 2024-02-17 NOTE — Assessment & Plan Note (Signed)
 Her mood is a stable GDR successfully, mirtazapine  7.5 mg daily, off lorazepam 

## 2024-02-17 NOTE — Assessment & Plan Note (Signed)
Stable, continue Mirapex.  

## 2024-02-17 NOTE — Assessment & Plan Note (Signed)
Heart rate is controlled, continue Coreg.

## 2024-02-20 ENCOUNTER — Encounter: Payer: Self-pay | Admitting: Nurse Practitioner

## 2024-03-30 ENCOUNTER — Encounter: Payer: Self-pay | Admitting: Nurse Practitioner

## 2024-03-30 ENCOUNTER — Non-Acute Institutional Stay (SKILLED_NURSING_FACILITY): Payer: Self-pay | Admitting: Nurse Practitioner

## 2024-03-30 DIAGNOSIS — N139 Obstructive and reflux uropathy, unspecified: Secondary | ICD-10-CM | POA: Diagnosis not present

## 2024-03-30 DIAGNOSIS — F0390 Unspecified dementia without behavioral disturbance: Secondary | ICD-10-CM | POA: Diagnosis not present

## 2024-03-30 DIAGNOSIS — F323 Major depressive disorder, single episode, severe with psychotic features: Secondary | ICD-10-CM

## 2024-03-30 DIAGNOSIS — I10 Essential (primary) hypertension: Secondary | ICD-10-CM | POA: Diagnosis not present

## 2024-03-30 DIAGNOSIS — J069 Acute upper respiratory infection, unspecified: Secondary | ICD-10-CM | POA: Diagnosis not present

## 2024-03-30 DIAGNOSIS — G2581 Restless legs syndrome: Secondary | ICD-10-CM | POA: Diagnosis not present

## 2024-03-30 DIAGNOSIS — J439 Emphysema, unspecified: Secondary | ICD-10-CM | POA: Diagnosis not present

## 2024-03-30 DIAGNOSIS — I471 Supraventricular tachycardia, unspecified: Secondary | ICD-10-CM | POA: Diagnosis not present

## 2024-03-30 DIAGNOSIS — E876 Hypokalemia: Secondary | ICD-10-CM

## 2024-03-30 NOTE — Assessment & Plan Note (Signed)
 the patient stated she sleeps well, GDR of  Mirtazapine , off Lorazepam 

## 2024-03-30 NOTE — Assessment & Plan Note (Signed)
 urethral stricture, off Oxybutynin  Continue SPC

## 2024-03-30 NOTE — Assessment & Plan Note (Signed)
 Heart rate seem controlled  takes Coreg 

## 2024-03-30 NOTE — Assessment & Plan Note (Signed)
 intermittent elevated Bp, asymptomatic, taking Carvedilol .

## 2024-03-30 NOTE — Progress Notes (Signed)
 " Location:  [name] Room Number: N029-A Place of Service:  SNF (31) Provider:  Anselma Herbel X,NP  Pallie Swigert X, NP  Patient Care Team: Carmel Garfield X, NP as PCP - General (Internal Medicine) [name], MD as Consulting Physician (Ophthalmology) [name], MD as Consulting Physician (Cardiology) [name], MD as Consulting Physician (Dermatology) [name], MD as Consulting Physician (Orthopedic Surgery) [name], MD (Inactive) as Consulting Physician (Orthopedic Surgery) [name], MD as Consulting Physician (Orthopedic Surgery) [name] Mom X, NP as Nurse Practitioner (Nurse Practitioner) [name], MD (Inactive) as Consulting Physician (Neurology)  Extended Emergency Contact Information Primary Emergency Contact: [name],[name] Address: [address]          [address]          [place], [place] [address] [place] Home Phone: [phone] Relation: Sister Secondary Emergency Contact: [name],[name]  [place] Home Phone: [phone] Mobile Phone: [phone] Relation: Sister  Code Status:  DNR Goals of care: Advanced Directive information    03/30/2024    1:53 PM  Advanced Directives  Does Patient Have a Medical Advance Directive? Yes  Type of Advance Directive Out of facility DNR (pink MOST or yellow form)  Does patient want to make changes to medical advance directive? No - Patient declined  Pre-existing out of facility DNR order (yellow form or pink MOST form) Pink MOST/Yellow Form most recent copy in chart - Physician notified to receive inpatient order     Chief Complaint  Patient presents with   Cough    Congestion & cough    HPI:  Pt is a 89 y.o. female seen today for an acute visit for cough and congestion, tested negative for flu and a COVID.  She is afebrile, no desaturation 02/12/22 Obstructive nephritis, underwent cystoscopy with right retrograde  pyelogram, right ureteral stent placed, f/u Urology Dr. Alvaro, 03/06/22 cystoscopy with retrograde pyelogram, ureteroscopy and stent replacement, SPC exchange.                05/03/22 abd US  no renal or gallbladder stone or hydronephrosis. 05/02/22 Xray abd may represent ileus. Hx of Ureteral stone with hydronephrosis per CT abd                          05/13/22 Urology: R ureteral stone, sp R ureteroscopy stone free 03/2022, stent. SPC  urethral stricture, off Oxybutynin . Hx of Ureteral stone with hydronephrosis per CT abd             External hemorrhoids, placed on Colace             Hypokalemia, K 4.4 10/09/23, taking  Kcl             Anemia, Hgb 12.8 10/09/23             SVT, takes Coreg              BLE edema, mild,  off Furosemide .              Adult failure to thrive, under Hospice service, weight stable.              Suprapubic Catheter, f/u Urology, off Uribel and Oxybutynin , Hx of urethral stricture.              Depression, the patient stated she sleeps well, GDR of  Mirtazapine , off Lorazepam   Dementia, under hospice service, prn Morphine  available to her.              Restless leg syndrome,  taking MiraPex               Hx of seizures, no active seizures, off meds.              COPD, on ProAir , Albuterol  neb, Mucinex, chronic cough             HTN, intermittent elevated Bp, asymptomatic, taking Carvedilol .       Past Medical History:  Diagnosis Date   Acute bronchitis 05/23/2011   Acute upper respiratory infections of unspecified site 05/23/2011   Arthritis    Chronic airway obstruction, not elsewhere classified 05/23/2011   Coronary artery disease    Disturbance of salivary secretion 01/31/2011   Dizziness and giddiness 01/31/2011   Dyspnea    Essential tremor 04/25/2014   External hemorrhoids without mention of complication 01/31/2011   Gait disorder 04/25/2014   GERD (gastroesophageal reflux disease)    History of kidney stones    Insomnia, unspecified 09/12/2011    Lumbago 01/31/2011   Major depressive disorder, single episode, unspecified 01/31/2011   Memory disorder 04/25/2014   Mitral valve disorders(424.0) 01/31/2011   Other and unspecified hyperlipidemia 01/31/2011   Other convulsions 01/31/2011   Other emphysema (HCC) 01/31/2011   Pain in joint, site unspecified 01/31/2011   Restless legs syndrome (RLS) 09/12/2011   Retinal detachment with retinal defect of right eye 2011   right eye twice   Seizure disorder (HCC)    Senile osteoporosis 01/31/2011   Spontaneous ecchymoses 01/31/2011   Squamous cell carcinoma of leg    Stiffness of joints, not elsewhere classified, multiple sites 01/31/2011   Unspecified essential hypertension 01/31/2011   Past Surgical History:  Procedure Laterality Date   ABDOMINAL HYSTERECTOMY  06/21/2003   TAH/BSO, omenectomy PSB resect, Stg IC cystadenofibroma   CHOLECYSTECTOMY  2005   Dr. [name]   CYSTOSCOPY W/ URETERAL STENT PLACEMENT Right 02/13/2022   Procedure: CYSTOSCOPY WITH RETROGRADE PYELOGRAM/URETERAL STENT PLACEMENT, AND SUPRAPUBIC TUBE EXCHANGE;  Surgeon: [name], MD;  Location: [facility];  Service: Urology;  Laterality: Right;   CYSTOSCOPY WITH RETROGRADE PYELOGRAM, URETEROSCOPY AND STENT PLACEMENT Right 03/06/2022   Procedure: CYSTOSCOPY WITH RETROGRADE PYELOGRAM, URETEROSCOPY AND STENT REPLACEMENT, SUPRAPUBIC CATHETER EXCHANGE;  Surgeon: [name], MD;  Location: [facility];  Service: Urology;  Laterality: Right;   ELBOW SURGERY Right 2008   broken   Dr. [name]   EYE SURGERY     HOLMIUM LASER APPLICATION Right 03/06/2022   Procedure: HOLMIUM LASER APPLICATION;  Surgeon: [name], MD;  Location: [facility];  Service: Urology;  Laterality: Right;   INTRAMEDULLARY (IM) NAIL INTERTROCHANTERIC Left 08/30/2021   Procedure: INTRAMEDULLARY (IM) NAIL INTERTROCHANTRIC;  Surgeon: [name], MD;  Location: [facility];  Service: Orthopedics;  Laterality: Left;   ORIF PATELLA Left 05/02/2020   Procedure:  OPEN REDUCTION INTERNAL (ORIF) FIXATION LEFT PATELLA WITH MEDIAL AND LATERAL LIGAMENT REINFORCEMENTS;  Surgeon: [name], MD;  Location: [facility];  Service: Orthopedics;  Laterality: Left;   ORIF PATELLA Left 05/30/2020   Procedure: OPEN REDUCTION INTERNAL (ORIF) FIXATION PATELLA;  Surgeon: [name], MD;  Location: [facility];  Service: Orthopedics;  Laterality: Left;   RETINAL DETACHMENT SURGERY N/A    two   REVERSE SHOULDER ARTHROPLASTY Left 05/06/2019   Procedure: REVERSE SHOULDER ARTHROPLASTY;  Surgeon: Melita Drivers, MD;  Location: [facility];  Service: Orthopedics;  Laterality:  Left;    ROTATOR CUFF REPAIR Right 2012   Dr. Gerome   SQUAMOUS CELL CARCINOMA EXCISION Bilateral 2012, 8/14   [place] on legs   Dr. Jadine   TONSILLECTOMY  1941   VIDEO BRONCHOSCOPY WITH ENDOBRONCHIAL NAVIGATION N/A 11/29/2015   Procedure: VIDEO BRONCHOSCOPY WITH ENDOBRONCHIAL NAVIGATION;  Surgeon: Lamar GORMAN Chris, MD;  Location: [facility];  Service: Thoracic;  Laterality: N/A;    Allergies[1]  Outpatient Encounter Medications as of 03/30/2024  Medication Sig   acetaminophen  (TYLENOL ) 325 MG tablet Take 650 mg by mouth every 6 (six) hours as needed for fever or mild pain. Give 2 tablet by mouth every 6 hours as needed for Pain   acetic acid 0.25 % irrigation Irrigate with 1 Application as directed 2 (two) times daily.   carvedilol  (COREG ) 3.125 MG tablet Take 1 tablet (3.125 mg total) by mouth 2 (two) times daily with a meal.   dextromethorphan-guaiFENesin (MUCINEX DM) 30-600 MG 12hr tablet Take 1 tablet by mouth every 6 (six) hours as needed for cough.   lidocaine  (LMX) 4 % cream Apply 1 Application topically daily. left lower leg topically one time a day for pain apply on left lower leg for pain   mirtazapine  (REMERON ) 7.5 MG tablet Take 7.5 mg by mouth at bedtime.   Morphine  Sulfate, Concentrate, 5 MG/0.25ML SOLN Take 0.25 mLs by mouth every 8 (eight) hours as needed.   nystatin powder Apply 1  Application topically as needed.   potassium chloride  (MICRO-K ) 10 MEQ CR capsule Take 10 mEq by mouth daily.   albuterol  (ACCUNEB ) 1.25 MG/3ML nebulizer solution Take 1 ampule by nebulization every 8 (eight) hours as needed for shortness of breath. (Patient not taking: Reported on 03/30/2024)   Albuterol  Sulfate (PROAIR  RESPICLICK) 108 (90 Base) MCG/ACT AEPB Inhale 2 puffs into the lungs daily as needed (shortness of breath or wheezing). (Patient not taking: Reported on 03/30/2024)   Benzocaine-Menthol  (ORAJEL 2X TOOTHACHE & GUM) 20-0.26 % GEL Use as directed 1 Dose in the mouth or throat every 6 (six) hours as needed (Tooth Pain). (Patient not taking: Reported on 03/30/2024)   docusate sodium  (COLACE) 100 MG capsule Take 100 mg by mouth as needed for mild constipation. (Patient not taking: Reported on 03/30/2024)   pramipexole  (MIRAPEX ) 0.25 MG tablet Take 0.25 mg by mouth daily. As needed for restless legs, see other bedtime listing (Patient not taking: Reported on 03/30/2024)   No facility-administered encounter medications on file as of 03/30/2024.    Review of Systems  Constitutional:  Positive for appetite change and fatigue. Negative for fever.       Sleepy  HENT:  Positive for congestion and hearing loss. Negative for trouble swallowing.   Eyes:  Negative for visual disturbance.  Respiratory:  Positive for cough. Negative for shortness of breath.        DOE is chronic  Cardiovascular:  Positive for leg swelling.  Gastrointestinal:  Negative for abdominal pain and constipation.       Hemorrhoids from previous examination.   Genitourinary:  Positive for difficulty urinating.       [facility]  Musculoskeletal:  Positive for arthralgias and gait problem.       S/p left hip ORIF, ORIF of the left patella. Positional pain in the left knee sometimes.   Skin:  Negative for color change.       BLE discoloration, chronic venous insufficiency skin changes   Neurological:  Negative for seizures,  weakness and headaches.  Memory lapses. Hx of seizures. RLS  Psychiatric/Behavioral:  Negative for behavioral problems and sleep disturbance. The patient is not nervous/anxious.     Immunization History  Administered Date(s) Administered    sv, Bivalent, Protein Subunit Rsvpref,pf (Abrysvo) 04/26/2022   Fluzone Influenza virus vaccine,trivalent (IIV3), split virus 01/15/2017, 01/08/2018, 12/09/2018   INFLUENZA, HIGH DOSE SEASONAL PF 12/25/2015, 01/20/2017, 01/08/2023, 01/13/2024   Influenza Split 01/06/2014   Influenza Whole 01/07/2012, 01/06/2013   Influenza,inj,Quad PF,6+ Mos 12/21/2014   Influenza-Unspecified 12/09/2018, 01/18/2020, 01/24/2021, 01/30/2022   Moderna SARS-COV2 Booster Vaccination 04/02/2021   Moderna Sars-Covid-2 Vaccination 04/12/2019, 06/05/2019, 02/15/2020, 09/05/2020   PFIZER(Purple Top)SARS-COV-2 Vaccination 12/26/2020   PPD Test 02/16/2022   Pneumococcal Conjugate-13 02/01/2014   Pneumococcal Polysaccharide-23 12/20/1992, 01/15/2000, 04/08/2004, 06/08/2004   Td 04/08/2002, 04/22/2002   Tdap 04/09/2011, 08/17/2015   Unspecified SARS-COV-2 Vaccination 01/22/2023   Zoster Recombinant(Shingrix) 05/29/2005, 08/27/2017, 06/13/2022   Zoster, Live 04/08/2008, 05/29/2014, 05/28/2017   Zoster, Unspecified 05/29/2005   Pertinent  Health Maintenance Due  Topic Date Due   Influenza Vaccine  Completed   Bone Density Scan  Completed   Mammogram  Discontinued      02/15/2022    3:00 PM 02/15/2022    9:00 PM 02/16/2022    8:00 AM 03/06/2022    7:25 AM 05/20/2022    3:42 PM  Fall Risk  Falls in the past year?     0  Was there an injury with Fall?     0   Fall Risk Category Calculator     0  (RETIRED) Patient Fall Risk Level Low fall risk  Moderate fall risk  Moderate fall risk  High fall risk    Patient at Risk for Falls Due to     No Fall Risks  Fall risk Follow up     Falls evaluation completed     Data saved with a previous flowsheet row definition    Functional Status Survey:    Vitals:   03/30/24 1350 03/30/24 1351  BP: (!) 180/100 (!) 164/99  Pulse: 78   Resp: 18   Temp: 98.8 F (37.1 C)   SpO2: 95%   Weight: 169 lb 11.2 oz (77 kg)   Height: 5' 2 (1.575 m)    Body mass index is 31.04 kg/m. Physical Exam Vitals and nursing note reviewed.  Constitutional:      Appearance: Normal appearance.  HENT:     Head: Normocephalic and atraumatic.     Nose: Congestion and rhinorrhea present.     Mouth/Throat:     Mouth: Mucous membranes are moist.  Eyes:     Extraocular Movements: Extraocular movements intact.     Conjunctiva/sclera: Conjunctivae normal.     Right eye: Right conjunctiva is not injected.     Left eye: Left conjunctiva is not injected.     Pupils: Pupils are equal, round, and reactive to light.  Neck:     Comments: Left adam's apple bony aspect, sometimes feels soreness on palpation is chronic, no noted lymph nodes  Cardiovascular:     Rate and Rhythm: Normal rate and regular rhythm.     Heart sounds: Murmur heard.     Comments: PD pulses are not felt from previous examination.  Pulmonary:     Effort: Pulmonary effort is normal.     Breath sounds: Rales present.     Comments: Decreased air entry to both lungs.  Bibasilar rales.  Abdominal:     General: Bowel sounds are normal.  Palpations: Abdomen is soft.     Tenderness: There is no abdominal tenderness.     Comments: Mid abd surgical scar. SPC  Genitourinary:    Comments: SPC. External hemorrhoids x2, no injury, bleeding, or pain  Musculoskeletal:     Cervical back: Normal range of motion and neck supple.     Right lower leg: Edema present.     Left lower leg: Edema present.     Comments: Decreased overhead ROM of the left shoulder. Left knee s/p ORIF of the patella fx.  Trace edema BLE is chronic  Skin:    General: Skin is warm and dry.     Findings: Lesion present.     Comments: BLE discoloration, chronic venous insufficiency skin changes.  Left shin skin lesions-opted out workup or tx.      Neurological:     General: No focal deficit present.     Mental Status: She is alert. Mental status is at baseline.     Gait: Gait abnormal.  Psychiatric:        Mood and Affect: Mood normal.        Behavior: Behavior normal.     Comments: Easily to be aroused while my examination, smiled, answered questions appropriately, stated she sleeps all the time     Labs reviewed: Recent Labs    10/09/23 1811  NA 142  K 4.4  CL 105  CO2 30*  BUN 13  CREATININE 0.8  CALCIUM 9.0   Recent Labs    10/09/23 1811  AST 14  ALT 9  ALBUMIN  3.5   Recent Labs    10/09/23 1811  WBC 5.4  NEUTROABS 2,857.00  HGB 12.8  HCT 43  PLT 308   Lab Results  Component Value Date   TSH 1.198 02/15/2022   No results found for: HGBA1C Lab Results  Component Value Date   CHOL 147 09/27/2021   HDL 48 09/27/2021   LDLCALC 78 09/27/2021   TRIG 131 09/27/2021   CHOLHDL 3.6 06/27/2015    Significant Diagnostic Results in last 30 days:  No results found.  Assessment/Plan URI (upper respiratory infection) cough and congestion, tested negative for flu and a COVID.  She is afebrile, no desaturation Tessalon 100 mg by mouth 3 times a day for 2 days  Lower obstructive uropathy urethral stricture, off Oxybutynin  Continue SPC  Hypokalemia  K 4.4 10/09/23, taking  Kcl  SVT (supraventricular tachycardia) Heart rate seem controlled  takes Coreg   Depression, psychotic (HCC) the patient stated she sleeps well, GDR of  Mirtazapine , off Lorazepam    Major neurocognitive disorder (HCC) under hospice service, prn Morphine  available to her.  No behavioral issues  Restless legs syndrome (RLS) Stable  taking MiraPex    COPD (chronic obstructive pulmonary disease) (HCC) on ProAir , Albuterol  neb, Mucinex, chronic cough  Essential hypertension  intermittent elevated Bp, asymptomatic, taking Carvedilol .      Family/ staff Communication:  Plan of care reviewed with the patient and charge nurse  Labs/tests ordered: None     [1]  Allergies Allergen Reactions   Vioxx [Rofecoxib] Shortness Of Breath   Dyazide [Hydrochlorothiazide-Triamterene] Other (See Comments)    Lowers blood pressure too much   Latex Swelling and Other (See Comments)    Allergic, per [facility]   Sulfa Antibiotics Nausea And Vomiting and Other (See Comments)    Allergic, per [facility]   Sumycin [Tetracycline] Other (See Comments)    Can't take due to drug reaction- Allergic, per [facility]   "

## 2024-03-30 NOTE — Assessment & Plan Note (Signed)
 cough and congestion, tested negative for flu and a COVID.  She is afebrile, no desaturation Tessalon 100 mg by mouth 3 times a day for 2 days

## 2024-03-30 NOTE — Assessment & Plan Note (Signed)
"   K 4.4 10/09/23, taking  Kcl "

## 2024-03-30 NOTE — Assessment & Plan Note (Signed)
 on ProAir , Albuterol  neb, Mucinex, chronic cough

## 2024-03-30 NOTE — Assessment & Plan Note (Signed)
 Stable  taking MiraPex 

## 2024-03-30 NOTE — Assessment & Plan Note (Signed)
 under hospice service, prn Morphine  available to her.  No behavioral issues

## 2024-04-07 ENCOUNTER — Encounter: Payer: Self-pay | Admitting: Nurse Practitioner

## 2024-04-07 ENCOUNTER — Non-Acute Institutional Stay (SKILLED_NURSING_FACILITY): Payer: Self-pay | Admitting: Nurse Practitioner

## 2024-04-07 DIAGNOSIS — G2581 Restless legs syndrome: Secondary | ICD-10-CM | POA: Diagnosis not present

## 2024-04-07 DIAGNOSIS — I10 Essential (primary) hypertension: Secondary | ICD-10-CM

## 2024-04-07 DIAGNOSIS — N139 Obstructive and reflux uropathy, unspecified: Secondary | ICD-10-CM

## 2024-04-07 DIAGNOSIS — R627 Adult failure to thrive: Secondary | ICD-10-CM

## 2024-04-07 DIAGNOSIS — F323 Major depressive disorder, single episode, severe with psychotic features: Secondary | ICD-10-CM

## 2024-04-07 DIAGNOSIS — E876 Hypokalemia: Secondary | ICD-10-CM | POA: Diagnosis not present

## 2024-04-07 DIAGNOSIS — F0390 Unspecified dementia without behavioral disturbance: Secondary | ICD-10-CM

## 2024-04-07 DIAGNOSIS — I471 Supraventricular tachycardia, unspecified: Secondary | ICD-10-CM | POA: Diagnosis not present

## 2024-04-07 NOTE — Assessment & Plan Note (Signed)
 taking MiraPex 

## 2024-04-07 NOTE — Progress Notes (Unsigned)
 " Location:  [name] Room Number: N029-A Place of Service:  SNF (31) Provider:  Daliyah Sramek X, NP  Patient Care Team: Klaire Court X, NP as PCP - General (Internal Medicine) [name], MD as Consulting Physician (Ophthalmology) [name], MD as Consulting Physician (Cardiology) [name], MD as Consulting Physician (Dermatology) [name], MD as Consulting Physician (Orthopedic Surgery) [name], MD (Inactive) as Consulting Physician (Orthopedic Surgery) [name], MD as Consulting Physician (Orthopedic Surgery) [name] Mon[facility] X, NP as Nurse Practitioner (Nurse Practitioner) [name], MD (Inactive) as Consulting Physician (Neurology)  Extended Emergency Contact Information Primary Emergency Contact: [name],[name] Address: [address]          [id number]          [place], [place] [address] [place] Home Phone: [phone] Relation: Sister Secondary Emergency Contact: [name],[name]  [place] Home Phone: [phone] Mobile Phone: [phone] Relation: Sister  Code Status:  DNR Goals of care: Advanced Directive information    04/07/2024    8:47 AM  Advanced Directives  Does Patient Have a Medical Advance Directive? Yes  Type of Advance Directive Out of facility DNR (pink MOST or yellow form)  Does patient want to make changes to medical advance directive? No - Patient declined  Pre-existing out of facility DNR order (yellow form or pink MOST form) Pink MOST form placed in chart (order not valid for inpatient use);Yellow form placed in chart (order not valid for inpatient use)     Chief Complaint  Patient presents with   Medical Management of Chronic Issues    Routine Visit    HPI:  Pt is a 89 y.o. female seen today for medical management of chronic diseases.   02/12/22 Obstructive nephritis, underwent cystoscopy with right retrograde pyelogram, right ureteral  stent placed, f/u Urology Dr. Alvaro, 03/06/22 cystoscopy with retrograde pyelogram, ureteroscopy and stent replacement, SPC exchange.                05/03/22 abd US  no renal or gallbladder stone or hydronephrosis. 05/02/22 Xray abd may represent ileus. Hx of Ureteral stone with hydronephrosis per CT abd                          05/13/22 Urology: R ureteral stone, sp R ureteroscopy stone free 03/2022, stent.  SPC  urethral stricture, off Oxybutynin . Hx of Ureteral stone with hydronephrosis per CT abd             External hemorrhoids, placed on Colace             Hypokalemia, K 4.4 10/09/23, taking  Kcl             Anemia, Hgb 12.8 10/09/23             SVT, takes Coreg              BLE edema, mild,  off Furosemide .              Adult failure to thrive, under Hospice service, weight stable.              Suprapubic Catheter, f/u Urology, off Uribel and Oxybutynin , Hx of urethral stricture.              Depression, the patient stated she sleeps well, GDR of  Mirtazapine , off Lorazepam   Dementia, under hospice service, prn Morphine  available to her.              Restless leg syndrome,  taking MiraPex               Hx of seizures, no active seizures, off meds.              COPD, on ProAir , Albuterol  neb, Mucinex, chronic cough             HTN, intermittent elevated Bp, asymptomatic, taking Carvedilol .    Past Medical History:  Diagnosis Date   Acute bronchitis 05/23/2011   Acute upper respiratory infections of unspecified site 05/23/2011   Arthritis    Chronic airway obstruction, not elsewhere classified 05/23/2011   Coronary artery disease    Disturbance of salivary secretion 01/31/2011   Dizziness and giddiness 01/31/2011   Dyspnea    Essential tremor 04/25/2014   External hemorrhoids without mention of complication 01/31/2011   Gait disorder 04/25/2014   GERD (gastroesophageal reflux disease)    History of kidney stones    Insomnia, unspecified 09/12/2011   Lumbago  01/31/2011   Major depressive disorder, single episode, unspecified 01/31/2011   Memory disorder 04/25/2014   Mitral valve disorders(424.0) 01/31/2011   Other and unspecified hyperlipidemia 01/31/2011   Other convulsions 01/31/2011   Other emphysema (HCC) 01/31/2011   Pain in joint, site unspecified 01/31/2011   Restless legs syndrome (RLS) 09/12/2011   Retinal detachment with retinal defect of right eye 2011   right eye twice   Seizure disorder (HCC)    Senile osteoporosis 01/31/2011   Spontaneous ecchymoses 01/31/2011   Squamous cell carcinoma of leg    Stiffness of joints, not elsewhere classified, multiple sites 01/31/2011   Unspecified essential hypertension 01/31/2011   Past Surgical History:  Procedure Laterality Date   ABDOMINAL HYSTERECTOMY  06/21/2003   TAH/BSO, omenectomy PSB resect, Stg IC cystadenofibroma   CHOLECYSTECTOMY  2005   Dr. [name]   CYSTOSCOPY W/ URETERAL STENT PLACEMENT Right 02/13/2022   Procedure: CYSTOSCOPY WITH RETROGRADE PYELOGRAM/URETERAL STENT PLACEMENT, AND SUPRAPUBIC TUBE EXCHANGE;  Surgeon: [name], MD;  Location: [facility];  Service: Urology;  Laterality: Right;   CYSTOSCOPY WITH RETROGRADE PYELOGRAM, URETEROSCOPY AND STENT PLACEMENT Right 03/06/2022   Procedure: CYSTOSCOPY WITH RETROGRADE PYELOGRAM, URETEROSCOPY AND STENT REPLACEMENT, SUPRAPUBIC CATHETER EXCHANGE;  Surgeon: [name], MD;  Location: [facility];  Service: Urology;  Laterality: Right;   ELBOW SURGERY Right 2008   broken   Dr. [name]   EYE SURGERY     HOLMIUM LASER APPLICATION Right 03/06/2022   Procedure: HOLMIUM LASER APPLICATION;  Surgeon: [name], MD;  Location: [facility];  Service: Urology;  Laterality: Right;   INTRAMEDULLARY (IM) NAIL INTERTROCHANTERIC Left 08/30/2021   Procedure: INTRAMEDULLARY (IM) NAIL INTERTROCHANTRIC;  Surgeon: [name], MD;  Location: [facility];  Service: Orthopedics;  Laterality: Left;   ORIF PATELLA Left 05/02/2020    Procedure: OPEN REDUCTION INTERNAL (ORIF) FIXATION LEFT PATELLA WITH MEDIAL AND LATERAL LIGAMENT REINFORCEMENTS;  Surgeon: [name], MD;  Location: [facility];  Service: Orthopedics;  Laterality: Left;   ORIF PATELLA Left 05/30/2020   Procedure: OPEN REDUCTION INTERNAL (ORIF) FIXATION PATELLA;  Surgeon: [name], MD;  Location: [facility];  Service: Orthopedics;  Laterality: Left;   RETINAL DETACHMENT SURGERY N/A    two   REVERSE SHOULDER ARTHROPLASTY Left 05/06/2019   Procedure: REVERSE SHOULDER ARTHROPLASTY;  Surgeon: Melita Drivers, MD;  Location: [facility];  Service: Orthopedics;  Laterality: Left;   ROTATOR CUFF REPAIR Right 2012   Dr. Gerome   SQUAMOUS CELL CARCINOMA EXCISION Bilateral 2012, 8/14   [place] on legs   Dr. Jadine   TONSILLECTOMY  1941   VIDEO BRONCHOSCOPY WITH ENDOBRONCHIAL NAVIGATION N/A 11/29/2015   Procedure: VIDEO BRONCHOSCOPY WITH ENDOBRONCHIAL NAVIGATION;  Surgeon: Lamar GORMAN Chris, MD;  Location: [facility];  Service: Thoracic;  Laterality: N/A;    Allergies[1]  Outpatient Encounter Medications as of 04/07/2024  Medication Sig   acetaminophen  (TYLENOL ) 325 MG tablet Take 650 mg by mouth every 6 (six) hours as needed for fever or mild pain. Give 2 tablet by mouth every 6 hours as needed for Pain   acetic acid 0.25 % irrigation Irrigate with 1 Application as directed 2 (two) times daily.   carvedilol  (COREG ) 3.125 MG tablet Take 1 tablet (3.125 mg total) by mouth 2 (two) times daily with a meal.   dextromethorphan-guaiFENesin (MUCINEX DM) 30-600 MG 12hr tablet Take 1 tablet by mouth every 6 (six) hours as needed for cough.   lidocaine  (LMX) 4 % cream Apply 1 Application topically daily. left lower leg topically one time a day for pain apply on left lower leg for pain   mirtazapine  (REMERON ) 7.5 MG tablet Take 7.5 mg by mouth at bedtime.   Morphine  Sulfate, Concentrate, 5 MG/0.25ML SOLN Take 0.25 mLs by mouth every 8 (eight) hours as needed.    nystatin powder Apply 1 Application topically as needed.   potassium chloride  (MICRO-K ) 10 MEQ CR capsule Take 10 mEq by mouth daily.   [DISCONTINUED] albuterol  (ACCUNEB ) 1.25 MG/3ML nebulizer solution Take 1 ampule by nebulization every 8 (eight) hours as needed for shortness of breath. (Patient not taking: Reported on 04/07/2024)   [DISCONTINUED] Albuterol  Sulfate (PROAIR  RESPICLICK) 108 (90 Base) MCG/ACT AEPB Inhale 2 puffs into the lungs daily as needed (shortness of breath or wheezing). (Patient not taking: Reported on 04/07/2024)   [DISCONTINUED] Benzocaine-Menthol  (ORAJEL 2X TOOTHACHE & GUM) 20-0.26 % GEL Use as directed 1 Dose in the mouth or throat every 6 (six) hours as needed (Tooth Pain). (Patient not taking: Reported on 04/07/2024)   [DISCONTINUED] docusate sodium  (COLACE) 100 MG capsule Take 100 mg by mouth as needed for mild constipation. (Patient not taking: Reported on 04/07/2024)   [DISCONTINUED] pramipexole  (MIRAPEX ) 0.25 MG tablet Take 0.25 mg by mouth daily. As needed for restless legs, see other bedtime listing (Patient not taking: Reported on 04/07/2024)   No facility-administered encounter medications on file as of 04/07/2024.    Review of Systems  Constitutional:  Negative for appetite change, fatigue and fever.       Sleepy  HENT:  Positive for hearing loss. Negative for congestion and trouble swallowing.   Eyes:  Negative for visual disturbance.  Respiratory:  Negative for cough and shortness of breath.        DOE is chronic  Cardiovascular:  Positive for leg swelling.  Gastrointestinal:  Negative for abdominal pain and constipation.       Hemorrhoids from previous examination.   Genitourinary:  Positive for difficulty urinating.       [facility]  Musculoskeletal:  Positive for arthralgias and gait problem.       S/p left hip ORIF, ORIF of the left patella. Positional pain in the left knee sometimes.   Skin:  Negative for color change.       BLE discoloration,  chronic venous insufficiency skin changes   Neurological:  Negative for seizures, weakness and headaches.  Memory lapses. Hx of seizures. RLS  Psychiatric/Behavioral:  Negative for behavioral problems and sleep disturbance. The patient is not nervous/anxious.     Immunization History  Administered Date(s) Administered    sv, Bivalent, Protein Subunit Rsvpref,pf (Abrysvo) 04/26/2022   Fluzone Influenza virus vaccine,trivalent (IIV3), split virus 01/15/2017, 01/08/2018, 12/09/2018   INFLUENZA, HIGH DOSE SEASONAL PF 12/25/2015, 01/20/2017, 01/08/2023, 01/13/2024   Influenza Split 01/06/2014   Influenza Whole 01/07/2012, 01/06/2013   Influenza,inj,Quad PF,6+ Mos 12/21/2014   Influenza-Unspecified 12/09/2018, 01/18/2020, 01/24/2021, 01/30/2022   Moderna SARS-COV2 Booster Vaccination 04/02/2021   Moderna Sars-Covid-2 Vaccination 04/12/2019, 06/05/2019, 02/15/2020, 09/05/2020   PFIZER(Purple Top)SARS-COV-2 Vaccination 12/26/2020   PPD Test 02/16/2022   Pneumococcal Conjugate-13 02/01/2014   Pneumococcal Polysaccharide-23 12/20/1992, 01/15/2000, 04/08/2004, 06/08/2004   Td 04/08/2002, 04/22/2002   Tdap 04/09/2011, 08/17/2015   Unspecified SARS-COV-2 Vaccination 01/22/2023   Zoster Recombinant(Shingrix) 05/29/2005, 08/27/2017, 06/13/2022   Zoster, Live 04/08/2008, 05/29/2014, 05/28/2017   Zoster, Unspecified 05/29/2005   Pertinent  Health Maintenance Due  Topic Date Due   Influenza Vaccine  Completed   Bone Density Scan  Completed   Mammogram  Discontinued      02/15/2022    3:00 PM 02/15/2022    9:00 PM 02/16/2022    8:00 AM 03/06/2022    7:25 AM 05/20/2022    3:42 PM  Fall Risk  Falls in the past year?     0  Was there an injury with Fall?     0   Fall Risk Category Calculator     0  (RETIRED) Patient Fall Risk Level Low fall risk  Moderate fall risk  Moderate fall risk  High fall risk    Patient at Risk for Falls Due to     No Fall Risks  Fall risk  Follow up     Falls evaluation completed     Data saved with a previous flowsheet row definition   Functional Status Survey:    Vitals:   04/07/24 0847  BP: 138/82  Pulse: 76  Resp: 18  Temp: 98.2 F (36.8 C)  SpO2: 95%  Weight: 169 lb 11.2 oz (77 kg)  Height: 5' 2 (1.575 m)   Body mass index is 31.04 kg/m. Physical Exam Vitals and nursing note reviewed.  Constitutional:      Appearance: Normal appearance.  HENT:     Head: Normocephalic and atraumatic.     Nose: Nose normal.     Mouth/Throat:     Mouth: Mucous membranes are moist.  Eyes:     Extraocular Movements: Extraocular movements intact.     Conjunctiva/sclera: Conjunctivae normal.     Right eye: Right conjunctiva is not injected.     Left eye: Left conjunctiva is not injected.     Pupils: Pupils are equal, round, and reactive to light.  Neck:     Comments: Left adam's apple bony aspect, sometimes feels soreness on palpation is chronic, no noted lymph nodes  Cardiovascular:     Rate and Rhythm: Normal rate and regular rhythm.     Heart sounds: Murmur heard.     Comments: PD pulses are not felt from previous examination.  Pulmonary:     Effort: Pulmonary effort is normal.     Breath sounds: Rales present.     Comments: Decreased air entry to both lungs.  Bibasilar rales.  Abdominal:     General: Bowel sounds are normal.     Palpations: Abdomen is soft.     Tenderness: There is no abdominal  tenderness.     Comments: Mid abd surgical scar. SPC  Genitourinary:    Comments: SPC. External hemorrhoids x2, no injury, bleeding, or pain  Musculoskeletal:     Cervical back: Normal range of motion and neck supple.     Right lower leg: Edema present.     Left lower leg: Edema present.     Comments: Decreased overhead ROM of the left shoulder. Left knee s/p ORIF of the patella fx.  Trace edema BLE is chronic  Skin:    General: Skin is warm and dry.     Findings: Lesion present.     Comments: BLE discoloration,  chronic venous insufficiency skin changes. Left shin skin lesions-opted out workup or tx.      Neurological:     General: No focal deficit present.     Mental Status: She is alert. Mental status is at baseline.     Gait: Gait abnormal.  Psychiatric:        Mood and Affect: Mood normal.        Behavior: Behavior normal.     Comments: Easily to be aroused while my examination, smiled, answered questions appropriately, stated she sleeps all the time     Labs reviewed: Recent Labs    10/09/23 1811  NA 142  K 4.4  CL 105  CO2 30*  BUN 13  CREATININE 0.8  CALCIUM 9.0   Recent Labs    10/09/23 1811  AST 14  ALT 9  ALBUMIN  3.5   Recent Labs    10/09/23 1811  WBC 5.4  NEUTROABS 2,857.00  HGB 12.8  HCT 43  PLT 308   Lab Results  Component Value Date   TSH 1.198 02/15/2022   No results found for: HGBA1C Lab Results  Component Value Date   CHOL 147 09/27/2021   HDL 48 09/27/2021   LDLCALC 78 09/27/2021   TRIG 131 09/27/2021   CHOLHDL 3.6 06/27/2015    Significant Diagnostic Results in last 30 days:  No results found.  Assessment/Plan Lower obstructive uropathy SPC  urethral stricture, off Oxybutynin . Hx of Ureteral stone with hydronephrosis per CT abd  Hypokalemia K 4.4 10/09/23, taking  Kcl  SVT (supraventricular tachycardia) Heart rate is in control takes Coreg   Adult failure to thrive  under Hospice service, weight stable.   Depression, psychotic (HCC)  the patient stated she sleeps well, GDR of  Mirtazapine , off Lorazepam    Dementia without behavioral disturbance (HCC) No behavorial issues, SNF [facility] for supportive care  Restless legs syndrome (RLS)  taking MiraPex       Family/ staff Communication: plan of care reviewed with the patient and charge nurse.   Labs/tests ordered:  none          [1] Allergies Allergen Reactions   Vioxx [Rofecoxib] Shortness Of Breath   Dyazide [Hydrochlorothiazide-Triamterene] Other (See Comments)     Lowers blood pressure too much   Latex Swelling and Other (See Comments)    Allergic, per [facility]   Sulfa Antibiotics Nausea And Vomiting and Other (See Comments)    Allergic, per [facility]   Sumycin [Tetracycline] Other (See Comments)    Can't take due to drug reaction- Allergic, per [facility]  "

## 2024-04-07 NOTE — Assessment & Plan Note (Signed)
 No behavorial issues, SNF [facility] for supportive care

## 2024-04-07 NOTE — Assessment & Plan Note (Signed)
 intermittent elevated Bp, asymptomatic, taking Carvedilol .

## 2024-04-07 NOTE — Assessment & Plan Note (Signed)
 under Hospice service, weight stable.

## 2024-04-07 NOTE — Assessment & Plan Note (Signed)
 Heart rate is in control takes Coreg 

## 2024-04-07 NOTE — Assessment & Plan Note (Signed)
"   K 4.4 10/09/23, taking  Kcl "

## 2024-04-07 NOTE — Assessment & Plan Note (Signed)
"   the patient stated she sleeps well, GDR of  Mirtazapine , off Lorazepam   "

## 2024-04-07 NOTE — Assessment & Plan Note (Signed)
 SPC  urethral stricture, off Oxybutynin . Hx of Ureteral stone with hydronephrosis per CT abd
